# Patient Record
Sex: Male | Born: 1935 | Race: White | Hispanic: No | Marital: Married | Smoking: Never smoker
Health system: Southern US, Community
[De-identification: ages and names within clinical notes are randomized; demographics above are authoritative.]

## PROBLEM LIST (undated history)

## (undated) DIAGNOSIS — K219 Gastro-esophageal reflux disease without esophagitis: Secondary | ICD-10-CM

## (undated) DIAGNOSIS — Z9289 Personal history of other medical treatment: Secondary | ICD-10-CM

## (undated) DIAGNOSIS — I2510 Atherosclerotic heart disease of native coronary artery without angina pectoris: Secondary | ICD-10-CM

## (undated) DIAGNOSIS — I10 Essential (primary) hypertension: Secondary | ICD-10-CM

## (undated) DIAGNOSIS — M109 Gout, unspecified: Secondary | ICD-10-CM

## (undated) DIAGNOSIS — Z951 Presence of aortocoronary bypass graft: Secondary | ICD-10-CM

## (undated) DIAGNOSIS — G4730 Sleep apnea, unspecified: Secondary | ICD-10-CM

## (undated) DIAGNOSIS — C61 Malignant neoplasm of prostate: Secondary | ICD-10-CM

## (undated) DIAGNOSIS — N182 Chronic kidney disease, stage 2 (mild): Secondary | ICD-10-CM

## (undated) DIAGNOSIS — N183 Chronic kidney disease, stage 3 unspecified: Secondary | ICD-10-CM

## (undated) DIAGNOSIS — M4800 Spinal stenosis, site unspecified: Secondary | ICD-10-CM

## (undated) DIAGNOSIS — M1990 Unspecified osteoarthritis, unspecified site: Secondary | ICD-10-CM

## (undated) DIAGNOSIS — I639 Cerebral infarction, unspecified: Secondary | ICD-10-CM

## (undated) HISTORY — DX: Presence of aortocoronary bypass graft: Z95.1

## (undated) HISTORY — DX: Cerebral infarction, unspecified: I63.9

## (undated) HISTORY — DX: Chronic kidney disease, stage 2 (mild): N18.2

## (undated) HISTORY — DX: Personal history of other medical treatment: Z92.89

## (undated) HISTORY — DX: Sleep apnea, unspecified: G47.30

## (undated) HISTORY — DX: Malignant neoplasm of prostate: C61

## (undated) HISTORY — DX: Essential (primary) hypertension: I10

## (undated) HISTORY — PX: CATARACT EXTRACTION W/ INTRAOCULAR LENS IMPLANT: SHX1309

## (undated) HISTORY — DX: Atherosclerotic heart disease of native coronary artery without angina pectoris: I25.10

## (undated) HISTORY — PX: LYMPH NODE DISSECTION: SHX5087

## (undated) HISTORY — PX: RETROPUBIC PROSTATECTOMY: SUR1055

---

## 1998-08-23 ENCOUNTER — Ambulatory Visit (HOSPITAL_COMMUNITY): Admission: RE | Admit: 1998-08-23 | Discharge: 1998-08-23 | Payer: Self-pay | Admitting: Ophthalmology

## 2000-11-03 ENCOUNTER — Encounter: Payer: Self-pay | Admitting: Family Medicine

## 2000-11-03 ENCOUNTER — Ambulatory Visit (HOSPITAL_COMMUNITY): Admission: RE | Admit: 2000-11-03 | Discharge: 2000-11-03 | Payer: Self-pay | Admitting: Family Medicine

## 2001-01-02 ENCOUNTER — Ambulatory Visit (HOSPITAL_COMMUNITY): Admission: RE | Admit: 2001-01-02 | Discharge: 2001-01-02 | Payer: Self-pay | Admitting: Gastroenterology

## 2001-01-02 ENCOUNTER — Encounter (INDEPENDENT_AMBULATORY_CARE_PROVIDER_SITE_OTHER): Payer: Self-pay | Admitting: *Deleted

## 2002-10-01 ENCOUNTER — Encounter: Admission: RE | Admit: 2002-10-01 | Discharge: 2002-10-01 | Payer: Self-pay | Admitting: Urology

## 2002-10-01 ENCOUNTER — Encounter: Payer: Self-pay | Admitting: Urology

## 2002-11-02 ENCOUNTER — Encounter: Payer: Self-pay | Admitting: Urology

## 2002-11-04 ENCOUNTER — Inpatient Hospital Stay (HOSPITAL_COMMUNITY): Admission: RE | Admit: 2002-11-04 | Discharge: 2002-11-08 | Payer: Self-pay | Admitting: Urology

## 2002-11-04 ENCOUNTER — Encounter (INDEPENDENT_AMBULATORY_CARE_PROVIDER_SITE_OTHER): Payer: Self-pay | Admitting: Specialist

## 2005-09-28 ENCOUNTER — Ambulatory Visit (HOSPITAL_COMMUNITY): Admission: RE | Admit: 2005-09-28 | Discharge: 2005-09-28 | Payer: Self-pay | Admitting: Gastroenterology

## 2005-09-28 ENCOUNTER — Encounter (INDEPENDENT_AMBULATORY_CARE_PROVIDER_SITE_OTHER): Payer: Self-pay | Admitting: *Deleted

## 2008-08-04 ENCOUNTER — Ambulatory Visit: Payer: Self-pay | Admitting: Cardiology

## 2009-04-13 ENCOUNTER — Encounter: Payer: Self-pay | Admitting: Cardiology

## 2009-04-13 ENCOUNTER — Ambulatory Visit: Payer: Self-pay

## 2009-04-13 ENCOUNTER — Encounter: Payer: Self-pay | Admitting: Cardiovascular Disease

## 2009-04-13 DIAGNOSIS — C61 Malignant neoplasm of prostate: Secondary | ICD-10-CM | POA: Insufficient documentation

## 2009-04-13 DIAGNOSIS — G4730 Sleep apnea, unspecified: Secondary | ICD-10-CM | POA: Insufficient documentation

## 2009-04-13 DIAGNOSIS — I10 Essential (primary) hypertension: Secondary | ICD-10-CM | POA: Insufficient documentation

## 2009-04-13 DIAGNOSIS — N183 Chronic kidney disease, stage 3 unspecified: Secondary | ICD-10-CM | POA: Insufficient documentation

## 2009-04-13 DIAGNOSIS — D692 Other nonthrombocytopenic purpura: Secondary | ICD-10-CM | POA: Insufficient documentation

## 2009-04-13 DIAGNOSIS — R9431 Abnormal electrocardiogram [ECG] [EKG]: Secondary | ICD-10-CM | POA: Insufficient documentation

## 2009-04-14 ENCOUNTER — Ambulatory Visit: Payer: Self-pay | Admitting: Cardiovascular Disease

## 2009-04-14 DIAGNOSIS — I739 Peripheral vascular disease, unspecified: Secondary | ICD-10-CM | POA: Insufficient documentation

## 2009-04-21 ENCOUNTER — Ambulatory Visit: Payer: Self-pay

## 2009-04-21 ENCOUNTER — Encounter: Payer: Self-pay | Admitting: Cardiovascular Disease

## 2010-04-20 NOTE — Assessment & Plan Note (Signed)
Summary: [facility]   Visit Type:  Follow-up Primary Provider:  Dr. Christell Constant  CC:  Painful Toes.  History of Present Illness: The patient presents for evaluation of bluish discoloration of his toes. This is most prominent of the second and third toes on both feet. He said it started as a painful callus on his right foot. He then noticed some purple spots on his toes that were itching. He does not describe any foot pain. He's not had any claudication. He's felt no palpitations. He's had no fevers or chills. He denies any other cardiovascular symptoms. He's had no problems with peripheral vascular disease or workup of the same in the past. He saw Dr. Christell Constant today. Labs were drawn to include a sedimentation rate, CBC, BMP. I believe a c-ANCA was also drawn. Results are pending.  Current Medications (verified): 1)  Benicar Hct 20-12.5 Mg Tabs (Olmesartan Medoxomil-Hctz) .Marland Kitchen.. 1 By Mouth Daliy  Allergies (verified): No Known Drug Allergies  Past History:  Past Medical History: Reviewed history from 04/13/2009 and no changes required. Current Problems:  HYPERTENSION, UNSPECIFIED (ICD-401.9) ELECTROCARDIOGRAM, ABNORMAL (ICD-794.31) RENAL INSUFFICIENCY (ICD-588.9) SLEEP APNEA (ICD-780.57)- Questionable CARCINOMA, PROSTATE (ICD-185)  Past Surgical History: Reviewed history from 04/13/2009 and no changes required.  Radical retropubic prostatectomy and bilateral pelvic lymph node dissection.  Review of Systems       As stated in the HPI and negative for all other systems.   Vital Signs:  Patient profile:   77 year old male Height:      70 inches Weight:      197 pounds BMI:     28.37 Pulse rate:   73 / minute Resp:     16 per minute BP sitting:   142 / 64  (right arm)  Vitals Entered By: Marrion Coy, CNA (April 13, 2009 4:19 PM)  Physical Exam  General:  Well developed, well nourished, in no acute distress. Head:  normocephalic and atraumatic Eyes:  PERRLA/EOM intact;  conjunctiva and lids normal. Mouth:   Oral mucosa normal. Neck:  Neck supple, no JVD. No masses, thyromegaly or abnormal cervical nodes. Chest Wall:  no deformities or breast masses noted Lungs:  Clear bilaterally to auscultation and percussion. Heart:  Non-displaced PMI, chest non-tender; regular rate and rhythm, S1, S2 soft early peaking systolic murmur heard at the apex, no rubs or gallops. Carotid upstroke normal, no bruit. Normal abdominal aortic size, no bruits. Femorals normal pulses, no bruits. Pedals normal pulses. No edema, no varicosities. Abdomen:  Bowel sounds positive; abdomen soft and non-tender without masses, organomegaly, or hernias noted. No hepatosplenomegaly. Msk:  Back normal, normal gait. Muscle strength and tone normal. Extremities:  no edema, no cyanosis, there are petechial hemorrhages and slight ecchymosis nontender and extremities of his digits particularly second and third toes on the right greater than left foot Psych:  Normal affect.   EKG  Procedure date:  04/13/2009  Findings:      sinus rhythm, rate 73, left axis deviation, no acute ST-T wave changes.  Impression & Recommendations:  Problem # 1:  VASCULAR PURPURA (ICD-287.2) I am worried that there could be some embolic or vasculitic process going on although the patient is not systemically ill. Labs have been drawn and we will follow these. I have spoken with Dr. Excell Seltzer one of our peripheral vascular disease physicians. He kindly agrees to see the patient tomorrow to further evaluate his feet. Until then I have suggested no further testing or therapy.

## 2010-04-20 NOTE — Assessment & Plan Note (Signed)
Summary: per DR [facility]ON   Visit Type:  new pt  Primary Provider:  Dr. Christell Constant  CC:  pt states his toes are a little blue..says his chest discomfort was doing better..denies any edema or sob.  History of Present Illness: This is a 77 year-old gentleman presenting for initial evaluation of purplish discoloration of his toes. The pt was seen by Dr Antoine Poche yesterday and we discussed his case.  Approximately 2 weeks ago he developed blue/purple discoloration on an area of callus on the left heel and also on the toes. He describes some associated pain involving his toes. He denies any past history of this. States that the rash and pain are both resolving at the present time. He denies fever, chills, chest or abdominal pain, nausea, vomiting, weight loss, or other systemic symptoms. He has had no recent medical tests or vascular procedures. He has no leg pain with ambulation. No history of stroke or TIA.  Current Medications (verified): 1)  Benicar Hct 20-12.5 Mg Tabs (Olmesartan Medoxomil-Hctz) .Marland Kitchen.. 1 By Mouth Daliy  Allergies (verified): No Known Drug Allergies  Past History:  Past medical, surgical, family and social histories (including risk factors) reviewed, and no changes noted (except as noted below).  Past Medical History: Reviewed history from 04/13/2009 and no changes required. Current Problems:  HYPERTENSION, UNSPECIFIED (ICD-401.9) ELECTROCARDIOGRAM, ABNORMAL (ICD-794.31) RENAL INSUFFICIENCY (ICD-588.9) SLEEP APNEA (ICD-780.57)- Questionable CARCINOMA, PROSTATE (ICD-185)  Past Surgical History: Reviewed history from 04/13/2009 and no changes required.  Radical retropubic prostatectomy and bilateral pelvic lymph node dissection.  Family History: Reviewed history from 04/13/2009 and no changes required.  Noncontributory for early coronary artery disease.  His   father he says died of a cerebral aneurysm.      Social History: Reviewed history from 04/13/2009 and no  changes required.  The patient is married.  He has 3 children.  He is   retired.  He has never smoked cigarettes.  He does not drink alcohol.      Review of Systems       Negative except as per HPI   Vital Signs:  Patient profile:   77 year old male Height:      70 inches Weight:      195 pounds BMI:     28.08 Pulse rate:   68 / minute Pulse rhythm:   regular BP sitting:   158 / 62  (left arm) BP standing:   152 / 60  (left arm) Cuff size:   large  Vitals Entered By: Danielle Rankin, CMA (April 14, 2009 2:29 PM)  Physical Exam  General:  Pt is alert and oriented, very pleasant elderly man in no acute distress. HEENT: normal Neck: normal carotid upstrokes without bruits, JVP normal Lungs: CTA CV: RRR without murmur or gallop Abd: soft, NT, positive BS, no bruit, no organomegaly Ext: no clubbing, cyanosis, or edema. femoral pulses 2+ without bruit, DP and PT pulses 2+=. 2nd and 3rd toes on both feet have purpuric lesions on the distal aspect of the toes, mild tenderness to touch.    Impression & Recommendations:  Problem # 1:  VASCULAR PURPURA (ICD-287.2) The etiology of the patient's rash is uncertain, but I suspect this is secondary to cholesterol crystal embolism (small vessel involvement). Since the patient has not had recent instrumentation, warfarin use, or other obvious etiology, he likely has aortic plaque as an etiology. I have recommended a urinalysis and urine eosinophil count to eval for renal involvement, and arterial studies with an  abdominal aortic ultrasound to rule out aneurysm and ABI's with toe-brachial indices. Fortunately, it doesn't appear the patient has significant systemic involvement. If no vascular lesion is found, the treatment will consist of ASA, statin, and general vascular risk reduction measures.  Orders: T-Urinalysis ([id number]) T- * Misc. Laboratory test [id number]) Abdominal Aorta Duplex (Abd Aorta Duplex)  Other Orders: Arterial Duplex  Lower Extremity (Arterial Duplex Low)  Patient Instructions: 1)  Your physician recommends that you schedule a follow-up appointment in: 1 month 2)  Your physician recommends that you return for lab work [id number] Urinalysis, Urine Eosinophil. 3)  Your physician has requested that you have an abdominal aorta duplex. During this test, an ultrasound is used to evaluate the aorta. Allow 30 minutes for this exam. Do not eat after midnight the day before and avoid carbonated beverages. There are no restrictions or special instructions. 4)  Your physician has requested that you have a lower or upper extremity arterial duplex.  This test is an ultrasound of the arteries in the legs or arms.  It looks at arterial blood flow in the legs and arms.  Allow one hour for Lower and Upper Arterial scans. There are no restrictions or special instructions.LE [place] with ABI'S and TBI'S

## 2010-05-29 ENCOUNTER — Other Ambulatory Visit: Payer: Self-pay | Admitting: Family Medicine

## 2010-05-29 DIAGNOSIS — T148XXA Other injury of unspecified body region, initial encounter: Secondary | ICD-10-CM

## 2010-05-29 DIAGNOSIS — M479 Spondylosis, unspecified: Secondary | ICD-10-CM

## 2010-05-30 ENCOUNTER — Ambulatory Visit
Admission: RE | Admit: 2010-05-30 | Discharge: 2010-05-30 | Disposition: A | Discharge status: Home / Self Care | Payer: MEDICARE | Source: Ambulatory Visit | Attending: Family Medicine | Admitting: Family Medicine

## 2010-05-30 DIAGNOSIS — M479 Spondylosis, unspecified: Secondary | ICD-10-CM

## 2010-05-30 DIAGNOSIS — T148XXA Other injury of unspecified body region, initial encounter: Secondary | ICD-10-CM

## 2010-08-01 NOTE — Assessment & Plan Note (Signed)
[facility]                            CARDIOLOGY OFFICE NOTE   Jon, Reid                       MRN:          [id number]  DATE:08/04/2008                            DOB:          03-16-1929    REFERRING PHYSICIAN:  Ernestina Penna, M.D.   REASON FOR PRESENTATION:  Evaluate the patient with an abnormal EKG and  hypertension.   HISTORY OF PRESENT ILLNESS:  The patient presents for evaluation of the  above.  He has no prior cardiac history.  However, he does have  hypertension.  He has an abnormal EKG with poor anterior R-wave  progression.  He says he has never had any cardiovascular workup.  He  has never had any cardiovascular diagnosis.  He reports he is quite  active.  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  He walks from his house into town in [place], which is about a  mile nightly.  He says with this level of activity, he denies any chest  discomfort, neck, or arm discomfort.  He says he has no palpitations,  presyncope, or syncope.  He does not report any PND or orthopnea.  He  has had some high blood pressure.  He has been on Benicar HCT.  His  blood pressure is elevated today, but he does have a blood pressure cuff  and a diary that he keeps.  This is in Dr. Kathi Der chart and I have  reviewed this.  His home blood pressures are below 140/90 fairly  consistently.  I did review his labs.  He has a mild dyslipidemia with  an LDL of 114.  However, his HDL is 48.  He thus have some mild renal  insufficiency probably related to his hypertension.  There is also  question of sleep apnea.  His wife brings this up because he snores.  However, he has not had a sleep workup.  He is very resistant to  medications and being diagnosed with any problems.  He reports that he  thinks he does very well given his age.   PAST MEDICAL HISTORY:  1. Hypertension x1 year.  2. Questionable sleep apnea.  3. Renal  insufficiency.   PAST SURGICAL HISTORY:  Prostatectomy.   ALLERGIES:  None.   MEDICATIONS:  Benicar HCT 40/12.5 daily.   SOCIAL HISTORY:  The patient is married.  He has 3 children.  He is  retired.  He has never smoked cigarettes.  He does not drink alcohol.   FAMILY HISTORY:  Noncontributory for early coronary artery disease.  His  father he says died of a cerebral aneurysm.   REVIEW OF SYSTEMS:  As stated in the HPI and negative for all other  systems.   PHYSICAL EXAMINATION:  GENERAL:  The patient is very pleasant and in no  distress.  VITAL SIGNS:  Blood pressure 152/80, heart rate 64 and regular, body  mass index 27, weight 202 pounds.  HEENT:  Eyelids unremarkable.  Pupils equal, round, and reactive to  light.  Fundi not visualized.  Oral mucosa unremarkable.  NECK:  No jugular venous distension at 45 degrees.  Carotid upstroke  brisk and symmetric.  No bruits.  No thyromegaly.  LYMPHATICS:  No cervical, axillary, or inguinal adenopathy.  LUNGS:  Clear to auscultation bilaterally.  BACK:  No costovertebral angle tenderness.  CHEST:  Unremarkable.  HEART:  PMI not displaced or sustained.  S1 and S2 within normal limits.  No S3, no S4.  No clicks, no rubs, no murmurs.  ABDOMEN:  Obese, positive bowel sounds, normal in frequency and pitch.  No bruits, no rebound, no guarding.  No midline pulsatile mass.  No  hepatomegaly, no splenomegaly.  SKIN:  No rashes, no nodules.  EXTREMITIES:  Pulses 2+ throughout.  No edema, no cyanosis, no clubbing.  NEUROLOGIC:  Oriented to person, place, and time.  Cranial nerves II  through XII grossly intact.  Motor grossly intact.   EKG, sinus rhythm, rate 64, left axis deviation, no acute ST-T wave  changes.   ASSESSMENT AND PLAN:  1. He had normal EKG.  The patient has some left axis deviation.      There is questionable poor anterior R-wave progression.  However, I      do not think, there is any evidence of old anteroseptal  infarct.      Previous EKG of Dr. Kathi Der office demonstrated an incomplete right      bundle-branch block.  These are all nonspecific findings.  At this      point, I think, the pretest probability of obstructive coronary      artery disease is very low and would not suggest that stress      testing would be indicated giving his very high level of activity      and his absence of symptoms.  Rather, he should continue with      primary risk reduction.  2. Hypertension.  His blood pressure is controlled.  He says it is a      good blood pressure cuff and he knows it to be accurate.  I did      point out that he probably has some strain on his heart with some      minimal LVH.  I also pointed out that he has some renal      insufficiency, which is likely related to some high blood pressure.      Therefore, he understands the need for good control of this.  He      will continue under the watchful eye of Dr. Christell Constant.  3. Dyslipidemia.  He has a reasonable ratio in the absence of known      coronary artery disease.  He will continue with diet and exercise.  4. Followup.  I would be happy to see him back at any point if there      are any further questions.     Rollene Rotunda, MD, [facility]  Electronically Signed   JH/MedQ  DD: 08/04/2008  DT: 08/05/2008  Job #: [id number]   cc:   Ernestina Penna, M.D.

## 2010-08-04 NOTE — Procedures (Signed)
[place]  Patient:    Jon Reid, Jon Reid Visit Number: [id number] MRN: [id number]          Service Type: END Location: ENDO Attending Physician:  Orland Mustard Dictated by:   Llana Aliment. Randa Evens, M.D. Proc. Date: 01/02/01 Admit Date:  01/02/2001   CC:         Antony Madura, M.D.   Procedure Report  PROCEDURE PERFORMED:  Colonoscopy and polypectomy.  ENDOSCOPIST:  Llana Aliment. Randa Evens, M.D.  MEDICATIONS USED:  Fentanyl 90 mcg, Versed 9 mg IV.  INSTRUMENT:  Adult Olympus video colonoscope.  INDICATIONS:  Strong family history of colon cancer.  DESCRIPTION OF PROCEDURE:  The procedure had been explained to the patient and consent obtained.  With the patient in the left lateral decubitus position, the Olympus adult video colonoscope was inserted and advanced under direct visualization.  The prep was excellent.  The patient had moderate diverticular disease.  It took some time to pass the sigmoid colon and we were able to advance rapidly to the cecum.  The ileocecal valve and appendiceal orifice were identified.  In the cecum, a 0.5 cm sessile polyp was encountered and removed through the snare and sucked through the scope.  No other polyps were seen.  The ascending colon, hepatic flexure, transverse colon, splenic flexure, descending and sigmoid colon were seen well upon withdrawal.  No polyps or other lesions were seen.  Moderate diverticular disease in the sigmoid colon.  The patient tolerated the procedure well.  Maintained on low flow oxygen and pulse oximeter throughout the procedure with no obvious problem.  ASSESSMENT: 1. Cecal polyp removed. 2. Moderate diverticulosis.  PLAN:  Routine postpolypectomy instructions and recommend repeating in three years. Dictated by:   Llana Aliment. Randa Evens, M.D. Attending Physician:  Orland Mustard DD:  01/02/01 TD:  01/02/01 Job: [id number]

## 2010-08-04 NOTE — Discharge Summary (Signed)
   NAME:  Jon Reid, Jon Reid                          ACCOUNT NO.:  [id number]   MEDICAL RECORD NO.:  [id number]                   PATIENT TYPE:  INP   LOCATION:  0372                                 FACILITY:  [facility]   PHYSICIAN:  Maretta Bees. Vonita Moss, M.D.             DATE OF BIRTH:  1928/05/15   DATE OF ADMISSION:  11/04/2002  DATE OF DISCHARGE:  11/08/2002                                 DISCHARGE SUMMARY   FINAL DIAGNOSES:  Prostatic carcinoma.   PROCEDURE:  Radical retropubic prostatectomy and bilateral pelvic lymph node  dissection on November 04, 2002.   HISTORY:  This 77 year old male was found to have a PSA of 4.83 and biopsy  showed Gleason grade 7 carcinoma in both lobes of the prostate.  He was  counseled about therapies and he opted for radical prostatectomy.  He is in  general good health.   PHYSICAL EXAMINATION:  Noncontributory.   HOSPITAL COURSE:  After admission he underwent a radical retropubic  prostatectomy and bilateral pelvic lymph node dissection.  Final pathology  showed negative lymph nodes and clear margins, but some focal extracapsular  extension of Gleason grade 7 carcinoma.  He had some pulmonary fever  postoperatively, but otherwise did well and was tolerating a diet and  ambulating well and ready for discharge on November 08, 2002.  His drain was  removed prior to discharge.  He had some mild penal/scrotal edema.  At the  time of discharge he was to go home on light activity and diet as tolerated.  He will return to the office in one week for skin staple removal.  He was  sent home on Vicodin for pain and laxatives as needed.  Sent home in good  condition.                                               Maretta Bees. Vonita Moss, M.D.    LJP/MEDQ  D:  11/24/2002  T:  11/24/2002  Job:  [id number]

## 2010-08-04 NOTE — H&P (Signed)
   NAME:  Jon Reid, Jon Reid                          ACCOUNT NO.:  [id number]   MEDICAL RECORD NO.:  [id number]                   PATIENT TYPE:  INP   LOCATION:  X003                                 FACILITY:  [facility]   PHYSICIAN:  Maretta Bees. Vonita Moss, M.D.             DATE OF BIRTH:  1929-01-24   DATE OF ADMISSION:  11/04/2002  DATE OF DISCHARGE:                                HISTORY & PHYSICAL   HISTORY:  This 77 year old gentleman was seen by me on June 23 for a PSA of  4.83.  PSA a year ago was 3.59.  He underwent prostate ultrasound and biopsy  on September 24, 2002.  His prostate was 31.5 mL and he had Gleason grade 7 (3+4)  in both lobes of the prostate.  He was counseled about therapy including  radiation therapy, observation, and surgery and he opted for surgery having  been counseled about risks of impotence, incontinence, rectal injury, or  hemorrhage.  CT scan showed no evidence of metastatic disease but he did  have sigmoid diverticula and a hiatal hernia.  Bone scan showed no evidence  of metastatic disease and therefore he was brought to the hospital today for  his radical prostatectomy.   PAST MEDICAL HISTORY:  He is in general good health.  He has no serious  medical illnesses.   PAST SURGICAL HISTORY:  He has had no previous surgery.   MEDICATIONS:  None.   ALLERGIES:  Denies any allergies to drugs.   SOCIAL HISTORY:  He does not smoke.  He drinks beer on occasion.   REVIEW OF SYSTEMS:  Noted in health history form.   FAMILY HISTORY:  Noncontributory.   PHYSICAL EXAMINATION:  VITAL SIGNS:  Blood pressure 184/89, pulse 80,  temperature 98.6.  GENERAL:  Alert and oriented.  Stated age.  SKIN:  Warm and dry.  NECK:  Supple.  CHEST:  Clear.  HEART:  Tones regular.  ABDOMEN:  Soft, nontender.  GENITOURINARY:  External genitalia normal.  RECTAL:  Prostate felt, benign.  EXTREMITIES:  No edema.   IMPRESSION:  1. Prostatic carcinoma.  2.     Hiatal hernia.  3.  Sigmoid diverticula.   PLAN:  Radical retropubic prostatectomy and bilateral pelvic lymph node  dissection.                                               Maretta Bees. Vonita Moss, M.D.    LJP/MEDQ  D:  11/04/2002  T:  11/04/2002  Job:  [id number]   cc:   Antony Madura, M.D.  [address], [address]  [place]  [place] [address]  Fax: [phone]

## 2010-08-04 NOTE — Procedures (Signed)
[place]  Patient:    Jon Reid, Jon Reid Visit Number: [id number] MRN: [id number]          Service Type: END Location: ENDO Attending Physician:  Orland Mustard Dictated by:   Llana Aliment. Randa Evens, M.D. Admit Date:  01/02/2001                             Procedure Report  NO DICTATION. Dictated by:   Llana Aliment. Randa Evens, M.D. Attending Physician:  Orland Mustard DD:  01/02/01 TD:  01/02/01 Job: [id number]

## 2010-08-04 NOTE — Op Note (Signed)
NAME:  Jon Reid, Jon Reid                          ACCOUNT NO.:  [id number]   MEDICAL RECORD NO.:  [id number]                   PATIENT TYPE:  INP   LOCATION:  X003                                 FACILITY:  [facility]   PHYSICIAN:  Maretta Bees. Vonita Moss, M.D.             DATE OF BIRTH:  1928/08/04   DATE OF PROCEDURE:  11/04/2002  DATE OF DISCHARGE:                                 OPERATIVE REPORT   PREOPERATIVE DIAGNOSIS:  Prostatic carcinoma.   POSTOPERATIVE DIAGNOSIS:  Prostatic carcinoma.   PROCEDURE:  Radical retropubic prostatectomy and bilateral pelvic lymph node  dissection.   SURGEON:  Maretta Bees. Vonita Moss, M.D.   ASSISTANTS:  1. Veverly Fells. Vernie Ammons, M.D.  2. Susanne Borders, M.D.   ANESTHESIA:  General.   INDICATIONS:  This 77 year old gentleman was found to have prostatic  carcinoma as noted in his H&P with a PSA of 4.03 and Gleason grade 7  carcinoma in the both lobes of the prostate.  He was counseled about  therapy, including surgery, radiation therapy, and observation, and he opted  for radical retropubic prostatectomy and was cleared for surgery by Antony Madura, M.D.   DESCRIPTION OF PROCEDURE:  The patient was brought to operating room and  placed in supine position.  The lower abdomen and external genitalia were  prepped and draped in the usual fashion.  A #24 French 30 mL Foley was  placed in the bladder.  A suprapubic vertical incision was made from the  symphysis pubis to below the umbilicus.  The rectus fascia and muscle were  divided in the midline.  The retroperitoneal pelvic spaces were dissected  out bilaterally with identification of the major vessels and the obturator  nerve.  The right obturator lymph node packet was dissected out, and Hem-o-  lok clips were used for hemostasis and lymphostasis, and the specimen was  sent for permanent section.  And in like fashion, the left obturator lymph  node packet was dissected out without problems.  The endopelvic  fascia was  then taken down bilaterally and the prostate mobilized.  There was no  significant puboprostatic ligaments.  There was a venous bleeder to the left  of the endopelvic fascia that was suture ligated.  The dorsal vein complexes  were pushed down and away from the periprostatic tissues.  A McDougal clamp  was placed around the dorsal vein complex, and a 2-0 Vicryl ligature was  placed on this, and later a 2-0 Vicryl suture ligature was also placed for  further hemostasis.  The Stamey retractor was used to bunch up the  preprostatic vessels, and a suture of 2-0 chromic catgut was placed to  prevent back-bleeding.  With the Stamey catheter in place and the McDougal  catheter behind the dorsal vein complex, the dorsal vein complex was incised  down to the urethra just beyond the apex of the prostate.  An umbilical tape  was placed around the back of the urethra which was then divided with a nice  apical margin and a god urethra.  The prostate was then dissected up off the  rectum without difficulty, and the prostatic pedicles taken down and divided  and ligated with hemoclips.  The seminal vesicals came up nicely off the  posterior attachments.  Unfortunately, there was some bleeding from the  vessels in the right neurovascular bundle that required some suture ligation  and small hemoclips to control.  The anterior bladder neck was opened up,  and the ureteral orifices were noted to excrete indigo blue stained urine.  The posterior prostate was dissected out after dividing posterior bladder  neck and electrocautery and hemoclips used for hemostasis.  The tips of the  seminal vesicals were dissected out, and the vas deferens and seminal  vesicals were removed after using hemoclips for hemostasis.  Specimen was  sent to pathology.  The mucosa of the bladder neck was approximated to the  surrounding musculature with interrupted 4-0 chromic catgut.  The posterior  bladder neck was closed  with running 2-0 chromic catgut.  The 22 French 5 mL  Foley was placed per urethra and brought into the bladder after using #1  Prolene that was tied through the tip of the catheter and brought out later  through the anterior bladder wall and later through the abdominal wall and  sewn to skin level with a button to secure it.  Then 2-0 Vicryl was placed  at 2, 5, 7, 12 o'clock through the bladder neck and corresponding aspect of  the membranous urethra.  The bladder neck anastomoses were then tied down  and secured in good position with 15 mL of water in the Foley catheter  balloon.  Bladder was then irrigated with antibiotic solution.  There was a  watertight anastomosis.  Blake drain was placed through a stab wound to the  left of our incision and placed prevesically and connected to bulb suction  and sutured in place with a black silk.  The wound was irrigated with triple  antibiotic solution.  The rectus fascia closed with running #1 PDS suture.  The skin was closed with interrupted skin staples.  Foley catheter was  connected to closed drainage after placing it on traction.  Sponge, needle,  and instrument count was correct.  Estimated blood loss was 800 mL, and he  tolerated the procedure well and was taken to the recovery room in good  condition.                                               Maretta Bees. Vonita Moss, M.D.    LJP/MEDQ  D:  11/04/2002  T:  11/04/2002  Job:  [id number]   cc:   Antony Madura, M.D.  [address], [address]  [place]  [place] [address]  Fax: [phone]

## 2010-08-04 NOTE — Op Note (Signed)
NAMEQUINTELL, Jon Reid                ACCOUNT NO.:  [id number]   MEDICAL RECORD NO.:  [id number]          PATIENT TYPE:  AMB   LOCATION:  ENDO                         FACILITY:  [facility]   PHYSICIAN:  Anselmo Rod, M.D.  DATE OF BIRTH:  Dec 07, 1928   DATE OF PROCEDURE:  09/28/2005  DATE OF DISCHARGE:                                 OPERATIVE REPORT   PROCEDURE. PERFORMED:  Colonoscopy with stent, polypectomy X 1 and cold  biopsies X4.   INDICATIONS FOR PROCEDURE:  This is a 77 year old white male with a personal  history of prostate cancer undergoing screening colonoscopy.  The patient  has a family history of colon cancer in his mother, rule out colonic polyps,  masses, etcetera.   PRE PROCEDURE PREPARATION:  Informed consent was procured from the patient.  The patient fasted for four hours prior to the procedure and prepped with  Osmo prep (32 pills) the night prior to and the morning of the procedure.  Risks and benefits of the procedure including a 10% risk for perforation  were discussed with the patient as well.   PRE PROCEDURE PHYSICAL:  The patient had stable vital signs.  NECK:  Supple.  CHEST:  Clear to auscultation with S1 S2.  ABDOMEN:  Soft with normal bowel sounds.   DESCRIPTION OF PROCEDURE:  The patient was placed in the left lateral  decubitus position and sedated with 70 mcg of fentanyl and 6 mg of Versed in  incremental doses.  Once the patient was adequately sedated and maintained  normal flow oxygen and continuous cardiac monitoring, the colonoscope was  advanced from the rectum to the cecum with difficulty.  There was  significant amount of residual in the colon.  Multiple washes were done.  A  small sessile polyp was removed by hard snare from the distal right colon.  Two small sessile polyps were biopsied from the cecal base.  Another two  small sessile polyps were biopsied from the mid right colon.  The terminal  ileum appeared healthy without lesions.   There was evidence of extensive  left-sided diverticulosis.  Prominent internal hemorrhoids were seen in  retroflexion to the rectum.  The patient tolerated the procedure well  without immediate complications.   IMPRESSION:  1.Prominent internal hemorrhoids seen in retroflexion.  2.Extensive sigmoid diverticulosis.  3.Multiple polyps removed by cold and one by a hard snare from the right  colon and cecum.  4.Normal terminal ileum.   RECOMMENDATIONS:  1.Await pathology results.  2.Avoid nonsteroidals and aspirin for the next two weeks.  3.Repeat colonoscopy depending on pathology results.  4.Brochures on diverticulosis and a high fiber diet have been given to the  patient for his education.  5.Outpatient follow up as need arises in the future.      Anselmo Rod, M.D.  Electronically Signed     JNM/MEDQ  D:  09/28/2005  T:  09/29/2005  Job:  [id number]   cc:   Maretta Bees. Vonita Moss, M.D.  Fax: [phone]   Antony Madura, M.D.  Fax: [phone]

## 2010-10-24 ENCOUNTER — Encounter: Payer: Self-pay | Admitting: Cardiology

## 2011-04-09 DIAGNOSIS — R269 Unspecified abnormalities of gait and mobility: Secondary | ICD-10-CM | POA: Diagnosis not present

## 2011-04-09 DIAGNOSIS — M47812 Spondylosis without myelopathy or radiculopathy, cervical region: Secondary | ICD-10-CM | POA: Diagnosis not present

## 2011-04-20 DIAGNOSIS — N529 Male erectile dysfunction, unspecified: Secondary | ICD-10-CM | POA: Diagnosis not present

## 2011-04-20 DIAGNOSIS — C61 Malignant neoplasm of prostate: Secondary | ICD-10-CM | POA: Diagnosis not present

## 2011-04-20 DIAGNOSIS — Z8546 Personal history of malignant neoplasm of prostate: Secondary | ICD-10-CM | POA: Diagnosis not present

## 2011-04-25 DIAGNOSIS — E538 Deficiency of other specified B group vitamins: Secondary | ICD-10-CM | POA: Diagnosis not present

## 2011-05-10 DIAGNOSIS — I10 Essential (primary) hypertension: Secondary | ICD-10-CM | POA: Diagnosis not present

## 2011-05-10 DIAGNOSIS — E785 Hyperlipidemia, unspecified: Secondary | ICD-10-CM | POA: Diagnosis not present

## 2011-05-10 DIAGNOSIS — E559 Vitamin D deficiency, unspecified: Secondary | ICD-10-CM | POA: Diagnosis not present

## 2011-06-13 DIAGNOSIS — E538 Deficiency of other specified B group vitamins: Secondary | ICD-10-CM | POA: Diagnosis not present

## 2011-06-26 DIAGNOSIS — I10 Essential (primary) hypertension: Secondary | ICD-10-CM | POA: Diagnosis not present

## 2011-06-26 DIAGNOSIS — Z79899 Other long term (current) drug therapy: Secondary | ICD-10-CM | POA: Diagnosis not present

## 2011-06-26 DIAGNOSIS — Z800 Family history of malignant neoplasm of digestive organs: Secondary | ICD-10-CM | POA: Diagnosis not present

## 2011-06-26 DIAGNOSIS — D469 Myelodysplastic syndrome, unspecified: Secondary | ICD-10-CM | POA: Diagnosis not present

## 2011-06-26 DIAGNOSIS — D649 Anemia, unspecified: Secondary | ICD-10-CM | POA: Diagnosis not present

## 2011-06-26 DIAGNOSIS — Z8546 Personal history of malignant neoplasm of prostate: Secondary | ICD-10-CM | POA: Diagnosis not present

## 2011-06-26 DIAGNOSIS — M47812 Spondylosis without myelopathy or radiculopathy, cervical region: Secondary | ICD-10-CM | POA: Diagnosis not present

## 2011-06-26 DIAGNOSIS — R269 Unspecified abnormalities of gait and mobility: Secondary | ICD-10-CM | POA: Diagnosis not present

## 2011-07-03 DIAGNOSIS — Z800 Family history of malignant neoplasm of digestive organs: Secondary | ICD-10-CM | POA: Diagnosis not present

## 2011-07-03 DIAGNOSIS — Z79899 Other long term (current) drug therapy: Secondary | ICD-10-CM | POA: Diagnosis not present

## 2011-07-03 DIAGNOSIS — I10 Essential (primary) hypertension: Secondary | ICD-10-CM | POA: Diagnosis not present

## 2011-07-03 DIAGNOSIS — Z8546 Personal history of malignant neoplasm of prostate: Secondary | ICD-10-CM | POA: Diagnosis not present

## 2011-07-03 DIAGNOSIS — D649 Anemia, unspecified: Secondary | ICD-10-CM | POA: Diagnosis not present

## 2011-07-03 DIAGNOSIS — R269 Unspecified abnormalities of gait and mobility: Secondary | ICD-10-CM | POA: Diagnosis not present

## 2011-07-03 DIAGNOSIS — D469 Myelodysplastic syndrome, unspecified: Secondary | ICD-10-CM | POA: Diagnosis not present

## 2011-07-03 DIAGNOSIS — M47812 Spondylosis without myelopathy or radiculopathy, cervical region: Secondary | ICD-10-CM | POA: Diagnosis not present

## 2011-07-16 DIAGNOSIS — E538 Deficiency of other specified B group vitamins: Secondary | ICD-10-CM | POA: Diagnosis not present

## 2011-08-15 DIAGNOSIS — E538 Deficiency of other specified B group vitamins: Secondary | ICD-10-CM | POA: Diagnosis not present

## 2011-08-27 DIAGNOSIS — E559 Vitamin D deficiency, unspecified: Secondary | ICD-10-CM | POA: Diagnosis not present

## 2011-08-27 DIAGNOSIS — I10 Essential (primary) hypertension: Secondary | ICD-10-CM | POA: Diagnosis not present

## 2011-08-27 DIAGNOSIS — R269 Unspecified abnormalities of gait and mobility: Secondary | ICD-10-CM | POA: Diagnosis not present

## 2011-08-27 DIAGNOSIS — E039 Hypothyroidism, unspecified: Secondary | ICD-10-CM | POA: Diagnosis not present

## 2011-08-27 DIAGNOSIS — E785 Hyperlipidemia, unspecified: Secondary | ICD-10-CM | POA: Diagnosis not present

## 2011-08-27 DIAGNOSIS — R7989 Other specified abnormal findings of blood chemistry: Secondary | ICD-10-CM | POA: Diagnosis not present

## 2011-09-10 ENCOUNTER — Telehealth: Payer: Self-pay | Admitting: Internal Medicine

## 2011-09-10 NOTE — Telephone Encounter (Signed)
called back and made new pt appt for 7/9   aom

## 2011-09-10 NOTE — Telephone Encounter (Signed)
l/m re:new pt appt  aom

## 2011-09-13 ENCOUNTER — Telehealth: Payer: Self-pay | Admitting: Internal Medicine

## 2011-09-13 NOTE — Telephone Encounter (Signed)
Referred by Dr. Rudi Heap Dx-Myelodysplasia

## 2011-09-15 DIAGNOSIS — Z961 Presence of intraocular lens: Secondary | ICD-10-CM | POA: Diagnosis not present

## 2011-09-19 DIAGNOSIS — E538 Deficiency of other specified B group vitamins: Secondary | ICD-10-CM | POA: Diagnosis not present

## 2011-09-25 ENCOUNTER — Telehealth: Payer: Self-pay | Admitting: Internal Medicine

## 2011-09-25 ENCOUNTER — Other Ambulatory Visit (HOSPITAL_BASED_OUTPATIENT_CLINIC_OR_DEPARTMENT_OTHER): Payer: MEDICARE | Admitting: Lab

## 2011-09-25 ENCOUNTER — Ambulatory Visit (HOSPITAL_BASED_OUTPATIENT_CLINIC_OR_DEPARTMENT_OTHER): Payer: MEDICARE | Admitting: Internal Medicine

## 2011-09-25 ENCOUNTER — Ambulatory Visit: Payer: MEDICARE

## 2011-09-25 VITALS — BP 139/67 | HR 64 | Temp 97.2°F | Ht 69.5 in | Wt 178.9 lb

## 2011-09-25 DIAGNOSIS — E538 Deficiency of other specified B group vitamins: Secondary | ICD-10-CM | POA: Diagnosis not present

## 2011-09-25 DIAGNOSIS — I10 Essential (primary) hypertension: Secondary | ICD-10-CM

## 2011-09-25 DIAGNOSIS — D469 Myelodysplastic syndrome, unspecified: Secondary | ICD-10-CM | POA: Diagnosis not present

## 2011-09-25 DIAGNOSIS — Z8546 Personal history of malignant neoplasm of prostate: Secondary | ICD-10-CM | POA: Diagnosis not present

## 2011-09-25 DIAGNOSIS — D7589 Other specified diseases of blood and blood-forming organs: Secondary | ICD-10-CM

## 2011-09-25 LAB — COMPREHENSIVE METABOLIC PANEL
Albumin: 4.5 g/dL (ref 3.5–5.2)
Alkaline Phosphatase: 64 U/L (ref 39–117)
BUN: 27 mg/dL — ABNORMAL HIGH (ref 6–23)
CO2: 24 mEq/L (ref 19–32)
Calcium: 9.2 mg/dL (ref 8.4–10.5)
Chloride: 104 mEq/L (ref 96–112)
Glucose, Bld: 96 mg/dL (ref 70–99)
Potassium: 4.4 mEq/L (ref 3.5–5.3)

## 2011-09-25 LAB — CBC WITH DIFFERENTIAL/PLATELET
Basophils Absolute: 0 10*3/uL (ref 0.0–0.1)
Eosinophils Absolute: 0.1 10*3/uL (ref 0.0–0.5)
HGB: 13.3 g/dL (ref 13.0–17.1)
MCV: 104.4 fL — ABNORMAL HIGH (ref 79.3–98.0)
MONO#: 0.6 10*3/uL (ref 0.1–0.9)
MONO%: 6.2 % (ref 0.0–14.0)
NEUT#: 6.7 10*3/uL — ABNORMAL HIGH (ref 1.5–6.5)
RDW: 12.4 % (ref 11.0–14.6)

## 2011-09-25 LAB — LACTATE DEHYDROGENASE: LDH: 166 U/L (ref 94–250)

## 2011-09-25 NOTE — Progress Notes (Signed)
[name] [phone]   [phone]  CONSULT NOTE  REASON FOR CONSULTATION:  78 years old white male with macrocytosis.  HPI [name] is a 78 y.o. male was past medical history significant for hypertension, prostate cancer status post prostatectomy 12 years ago, cervical spondylosis, and history of vitamin B12 deficiency. The patient was found by his primary care physician Dr. Christell Constant to have macrocytosis. He was seen by Dr. Dara Lords on 06/26/2011 for evaluation of this problem. He has several studies performed at that time including thyroid function, ESR, LDH, reticulocyte, quantitative immunoglobulin and antiglobulin test as well as SPEP and peripheral blood smear. They were all unremarkable except for macrocytosis on the peripheral blood smear. Dr. Dara Lords recommended for the patient continuous observation especially he was asymptomatic. The patient was seen recently by Dr. Christell Constant and he requested a second opinion. He was referred to me today for further evaluation and recommendation regarding his condition. He lost around 20 pounds over the last year. He has no night sweats. He denied having any bleeding or bruises. He has intermittent weakness and fatigue. He has no significant complaints today except for occasional neck pain secondary to cervical stenosis. The patient mentions that he is interested in moving his care to [place]. He is married and has 3 children. He works in the building of antique cars. He has no history of smoking, alcohol or drug abuse.  @SFHPI @  Past Medical History  Diagnosis Date  . Unspecified essential hypertension   . Electrocardiogram abnormal   . Renal insufficiency   . Sleep apnea     Questionable  . Carcinoma of prostate     Past Surgical History  Procedure Date  . Retropubic prostatectomy     Radical  . Lymph node dissection     Bilateral pelvic    Family History  Problem Relation Age of Onset  .  Aneurysm Father     Cerebral  . Coronary artery disease Neg Hx     Social History History  Substance Use Topics  . Smoking status: Never Smoker   . Smokeless tobacco: Not on file  . Alcohol Use: No    Not on File  Current Outpatient Prescriptions  Medication Sig Dispense Refill  . cholecalciferol (VITAMIN D) 1000 UNITS tablet Take 2,000 Units by mouth daily.      . cyanocobalamin 1000 MCG tablet Inject 100 mcg into the muscle every 30 (thirty) days.      . DULoxetine (CYMBALTA) 30 MG capsule Take 30 mg by mouth 3 (three) times a week.      . olmesartan-hydrochlorothiazide (BENICAR HCT) 20-12.5 MG per tablet Take 1 tablet by mouth daily.          Review of Systems  A comprehensive review of systems was negative except for: Constitutional: positive for fatigue and weight loss  Physical Exam  AVW:UJWJX, healthy, no distress, well nourished and well developed SKIN: skin color, texture, turgor are normal HEAD: Normocephalic, No masses, lesions, tenderness or abnormalities EYES: normal EARS: External ears normal OROPHARYNX:no exudate and no erythema  NECK: supple, no adenopathy LYMPH:  no palpable lymphadenopathy, no hepatosplenomegaly LUNGS: clear to auscultation  HEART: regular rate & rhythm, no murmurs and no gallops ABDOMEN:abdomen soft, non-tender, normal bowel sounds and no masses or organomegaly BACK: Back symmetric, no curvature. EXTREMITIES:no joint deformities, effusion, or inflammation, no edema, no skin discoloration, no clubbing, no cyanosis  NEURO: alert & oriented x 3 with fluent speech, no focal  motor/sensory deficits  PERFORMANCE STATUS: ECOG 1  LABORATORY DATA: Lab Results  Component Value Date   WBC 9.2 09/25/2011   HGB 13.3 09/25/2011   HCT 39.7 09/25/2011   MCV 104.4* 09/25/2011   PLT 217 09/25/2011      Chemistry   No results found for this basename: NA, K, CL, CO2, BUN, CREATININE, GLU   No results found for this basename: CALCIUM, ALKPHOS, AST, ALT,  BILITOT       RADIOGRAPHIC STUDIES: No results found.  ASSESSMENT: This is a very pleasant 78 years old white male with macrocytosis probably secondary to vitamin B12 deficiency versus early myelodysplastic syndrome. The patient is currently asymptomatic.  PLAN: I have a lengthy discussion with the patient today about his condition. I gave him the option of proceeding with a bone marrow biopsy and aspirate versus observation and consideration of the biopsy if he has any significant abnormalities in the future. He would like to wait and continue on observation for now. He also would like to transfer his care to [facility]. He would also continue on monthly vitamin B12 injection under the care of Dr. Christell Constant. I would see him back for followup visit in 3 months with repeat CBC, comprehensive metabolic panel and LDH. He was advised to call me immediately if he has any concerning symptoms in the interval.  All questions were answered. The patient knows to call the clinic with any problems, questions or concerns. We can certainly see the patient much sooner if necessary.  Thank you so much for allowing me to participate in the care of Jon Reid. I will continue to follow up the patient with you and assist in his care.  I spent 30 minutes counseling the patient face to face. The total time spent in the appointment was 55 minutes.   Marchell Froman K. 09/25/2011, 3:04 PM

## 2011-09-25 NOTE — Telephone Encounter (Signed)
appts made and printed for pt aom °

## 2011-10-22 DIAGNOSIS — E538 Deficiency of other specified B group vitamins: Secondary | ICD-10-CM | POA: Diagnosis not present

## 2011-11-27 DIAGNOSIS — I10 Essential (primary) hypertension: Secondary | ICD-10-CM | POA: Diagnosis not present

## 2011-11-27 DIAGNOSIS — R7989 Other specified abnormal findings of blood chemistry: Secondary | ICD-10-CM | POA: Diagnosis not present

## 2011-11-27 DIAGNOSIS — E538 Deficiency of other specified B group vitamins: Secondary | ICD-10-CM | POA: Diagnosis not present

## 2011-11-27 DIAGNOSIS — E559 Vitamin D deficiency, unspecified: Secondary | ICD-10-CM | POA: Diagnosis not present

## 2011-11-27 DIAGNOSIS — E785 Hyperlipidemia, unspecified: Secondary | ICD-10-CM | POA: Diagnosis not present

## 2011-12-31 DIAGNOSIS — Z23 Encounter for immunization: Secondary | ICD-10-CM | POA: Diagnosis not present

## 2011-12-31 DIAGNOSIS — E538 Deficiency of other specified B group vitamins: Secondary | ICD-10-CM | POA: Diagnosis not present

## 2012-01-07 ENCOUNTER — Other Ambulatory Visit: Payer: Self-pay | Admitting: *Deleted

## 2012-01-07 DIAGNOSIS — D7589 Other specified diseases of blood and blood-forming organs: Secondary | ICD-10-CM

## 2012-01-08 ENCOUNTER — Telehealth: Payer: Self-pay | Admitting: Internal Medicine

## 2012-01-08 ENCOUNTER — Other Ambulatory Visit (HOSPITAL_BASED_OUTPATIENT_CLINIC_OR_DEPARTMENT_OTHER): Payer: MEDICARE | Admitting: Lab

## 2012-01-08 ENCOUNTER — Ambulatory Visit (HOSPITAL_BASED_OUTPATIENT_CLINIC_OR_DEPARTMENT_OTHER): Payer: MEDICARE | Admitting: Internal Medicine

## 2012-01-08 VITALS — BP 147/79 | HR 70 | Temp 97.2°F | Resp 20 | Ht 69.5 in | Wt 179.6 lb

## 2012-01-08 DIAGNOSIS — D7589 Other specified diseases of blood and blood-forming organs: Secondary | ICD-10-CM

## 2012-01-08 HISTORY — DX: Other specified diseases of blood and blood-forming organs: D75.89

## 2012-01-08 LAB — CBC WITH DIFFERENTIAL/PLATELET
Basophils Absolute: 0.1 10*3/uL (ref 0.0–0.1)
Eosinophils Absolute: 0.1 10*3/uL (ref 0.0–0.5)
HGB: 13.7 g/dL (ref 13.0–17.1)
MCV: 103 fL — ABNORMAL HIGH (ref 79.3–98.0)
MONO#: 0.6 10*3/uL (ref 0.1–0.9)
NEUT#: 4.1 10*3/uL (ref 1.5–6.5)
RBC: 3.89 10*6/uL — ABNORMAL LOW (ref 4.20–5.82)
RDW: 11.9 % (ref 11.0–14.6)
WBC: 7.1 10*3/uL (ref 4.0–10.3)

## 2012-01-08 LAB — COMPREHENSIVE METABOLIC PANEL (CC13)
Albumin: 4.4 g/dL (ref 3.5–5.0)
BUN: 23 mg/dL (ref 7.0–26.0)
CO2: 22 mEq/L (ref 22–29)
Calcium: 9.6 mg/dL (ref 8.4–10.4)
Chloride: 104 mEq/L (ref 98–107)
Glucose: 108 mg/dl — ABNORMAL HIGH (ref 70–99)
Potassium: 4.3 mEq/L (ref 3.5–5.1)
Sodium: 137 mEq/L (ref 136–145)
Total Protein: 7 g/dL (ref 6.4–8.3)

## 2012-01-08 LAB — LACTATE DEHYDROGENASE (CC13): LDH: 199 U/L (ref 125–220)

## 2012-01-08 NOTE — Progress Notes (Signed)
[facility] [phone]   [phone]  OFFICE PROGRESS NOTE  DIAGNOSIS: Macrocytosis without anemia  PRIOR THERAPY: None  CURRENT THERAPY: Vitamin B 12 1000 mcg intramuscular monthly basis under the care of Dr. Christell Constant  INTERVAL HISTORY: Jon Reid 78 y.o. male returns to the clinic today for routine three-month followup visit. The patient is feeling fine today with no specific complaints. He denied having any significant fatigue or weakness. He denied having any chest pain, shortness breath, cough or hemoptysis. No significant weight loss or night sweats and no bleeding issues. He has repeat CBC performed earlier today and he is here for evaluation and discussion of his lab results.  MEDICAL HISTORY: Past Medical History  Diagnosis Date  . Unspecified essential hypertension   . Electrocardiogram abnormal   . Renal insufficiency   . Sleep apnea     Questionable  . Carcinoma of prostate     ALLERGIES:   has no allergies on file.  MEDICATIONS:  Current Outpatient Prescriptions  Medication Sig Dispense Refill  . cholecalciferol (VITAMIN D) 1000 UNITS tablet Take 2,000 Units by mouth daily.      . cyanocobalamin 1000 MCG tablet Inject 100 mcg into the muscle every 30 (thirty) days.      . DULoxetine (CYMBALTA) 30 MG capsule Take 30 mg by mouth 3 (three) times a week.      . olmesartan-hydrochlorothiazide (BENICAR HCT) 20-12.5 MG per tablet Take 1 tablet by mouth daily.          SURGICAL HISTORY:  Past Surgical History  Procedure Date  . Retropubic prostatectomy     Radical  . Lymph node dissection     Bilateral pelvic    REVIEW OF SYSTEMS:  A comprehensive review of systems was negative.   PHYSICAL EXAMINATION: General appearance: alert, cooperative and no distress Neck: no adenopathy Lymph nodes: Cervical, supraclavicular, and axillary nodes normal. Resp: clear to auscultation bilaterally Cardio: regular rate and rhythm, S1, S2  normal, no murmur, click, rub or gallop GI: soft, non-tender; bowel sounds normal; no masses,  no organomegaly Extremities: extremities normal, atraumatic, no cyanosis or edema  ECOG PERFORMANCE STATUS: 0 - Asymptomatic  Blood pressure 147/79, pulse 70, temperature 97.2 F (36.2 C), temperature source Oral, resp. rate 20, height 5' 9.5" (1.765 m), weight 179 lb 9.6 oz (81.466 kg).  LABORATORY DATA: Lab Results  Component Value Date   WBC 7.1 01/08/2012   HGB 13.7 01/08/2012   HCT 40.0 01/08/2012   MCV 103.0* 01/08/2012   PLT 242 01/08/2012      Chemistry      Component Value Date/Time   NA 140 09/25/2011 1312   K 4.4 09/25/2011 1312   CL 104 09/25/2011 1312   CO2 24 09/25/2011 1312   BUN 27* 09/25/2011 1312   CREATININE 1.61* 09/25/2011 1312      Component Value Date/Time   CALCIUM 9.2 09/25/2011 1312   ALKPHOS 64 09/25/2011 1312   AST 23 09/25/2011 1312   ALT 14 09/25/2011 1312   BILITOT 0.9 09/25/2011 1312       RADIOGRAPHIC STUDIES: No results found.  ASSESSMENT: This is a very pleasant 78 years old white male with history of macrocytosis without anemia most likely secondary to nutritional deficiency and he is currently on treatment with vitamin B12 injection on monthly basis. His lab results today are stable and the patient is currently asymptomatic.  PLAN: I discussed the lab result with the patient today.  I recommended for him to continue on observation for now with repeat CBC, comprehensive metabolic panel and LDH in 3 months. If the patient has any significant symptoms related to his condition I may consider him for a bone marrow biopsy and aspirate for further evaluation. He was advised to call immediately if he has any concerning symptoms in the interval.  All questions were answered. The patient knows to call the clinic with any problems, questions or concerns. We can certainly see the patient much sooner if necessary.

## 2012-01-08 NOTE — Patient Instructions (Signed)
Your labwork are stable today. Followup by observation and repeat blood work in 3 months

## 2012-01-08 NOTE — Telephone Encounter (Signed)
appts made and printed for pt  °

## 2012-01-28 DIAGNOSIS — E538 Deficiency of other specified B group vitamins: Secondary | ICD-10-CM | POA: Diagnosis not present

## 2012-03-06 DIAGNOSIS — E559 Vitamin D deficiency, unspecified: Secondary | ICD-10-CM | POA: Diagnosis not present

## 2012-03-06 DIAGNOSIS — Q068 Other specified congenital malformations of spinal cord: Secondary | ICD-10-CM | POA: Diagnosis not present

## 2012-03-06 DIAGNOSIS — Z79899 Other long term (current) drug therapy: Secondary | ICD-10-CM | POA: Diagnosis not present

## 2012-03-06 DIAGNOSIS — R5381 Other malaise: Secondary | ICD-10-CM | POA: Diagnosis not present

## 2012-03-06 DIAGNOSIS — Z125 Encounter for screening for malignant neoplasm of prostate: Secondary | ICD-10-CM | POA: Diagnosis not present

## 2012-03-06 DIAGNOSIS — I10 Essential (primary) hypertension: Secondary | ICD-10-CM | POA: Diagnosis not present

## 2012-03-10 DIAGNOSIS — Z1212 Encounter for screening for malignant neoplasm of rectum: Secondary | ICD-10-CM | POA: Diagnosis not present

## 2012-04-04 ENCOUNTER — Telehealth: Payer: Self-pay | Admitting: Internal Medicine

## 2012-04-04 NOTE — Telephone Encounter (Signed)
l/m that mkm will be out of the office on 1/23 and has r/s to 1/30     Jon Reid

## 2012-04-10 ENCOUNTER — Ambulatory Visit: Payer: MEDICARE | Admitting: Internal Medicine

## 2012-04-10 ENCOUNTER — Other Ambulatory Visit: Payer: MEDICARE | Admitting: Lab

## 2012-04-15 DIAGNOSIS — E538 Deficiency of other specified B group vitamins: Secondary | ICD-10-CM | POA: Diagnosis not present

## 2012-04-17 ENCOUNTER — Other Ambulatory Visit (HOSPITAL_BASED_OUTPATIENT_CLINIC_OR_DEPARTMENT_OTHER): Payer: MEDICARE | Admitting: Lab

## 2012-04-17 ENCOUNTER — Encounter: Payer: Self-pay | Admitting: Internal Medicine

## 2012-04-17 ENCOUNTER — Ambulatory Visit (HOSPITAL_BASED_OUTPATIENT_CLINIC_OR_DEPARTMENT_OTHER): Payer: MEDICARE | Admitting: Internal Medicine

## 2012-04-17 VITALS — BP 140/64 | HR 69 | Temp 97.0°F | Resp 20 | Ht 69.5 in | Wt 185.7 lb

## 2012-04-17 DIAGNOSIS — D7589 Other specified diseases of blood and blood-forming organs: Secondary | ICD-10-CM

## 2012-04-17 LAB — CBC WITH DIFFERENTIAL/PLATELET
Basophils Absolute: 0 10*3/uL (ref 0.0–0.1)
EOS%: 0.6 % (ref 0.0–7.0)
Eosinophils Absolute: 0.1 10*3/uL (ref 0.0–0.5)
LYMPH%: 23.8 % (ref 14.0–49.0)
MCHC: 34.3 g/dL (ref 32.0–36.0)
MONO#: 1 10*3/uL — ABNORMAL HIGH (ref 0.1–0.9)
MONO%: 11.9 % (ref 0.0–14.0)
NEUT#: 5.5 10*3/uL (ref 1.5–6.5)
NEUT%: 63.2 % (ref 39.0–75.0)
RBC: 3.83 10*6/uL — ABNORMAL LOW (ref 4.20–5.82)
RDW: 12.3 % (ref 11.0–14.6)
WBC: 8.8 10*3/uL (ref 4.0–10.3)

## 2012-04-17 LAB — COMPREHENSIVE METABOLIC PANEL (CC13)
AST: 25 U/L (ref 5–34)
BUN: 23.9 mg/dL (ref 7.0–26.0)
Calcium: 9.3 mg/dL (ref 8.4–10.4)
Chloride: 105 mEq/L (ref 98–107)
Creatinine: 1.5 mg/dL — ABNORMAL HIGH (ref 0.7–1.3)
Total Bilirubin: 0.97 mg/dL (ref 0.20–1.20)

## 2012-04-17 NOTE — Progress Notes (Signed)
[facility] [phone]   [phone]  OFFICE PROGRESS NOTE  DIAGNOSIS: Macrocytosis without anemia   PRIOR THERAPY: None   CURRENT THERAPY: Vitamin B 12 1000 mcg intramuscular monthly basis under the care of Dr. Christell Constant  INTERVAL HISTORY: Jon Reid 79 y.o. male returns to the clinic today for routine followup visit. The patient is feeling fine with no specific complaints. He denied having any fever or chills, no nausea or vomiting, no weight loss or night sweats. He denied chest pain, shortness breath, cough or hemoptysis. He is tolerating his vitamin B12 injection monthly with no significant adverse effects. He has repeat CBC performed earlier today and he is here for evaluation and discussion of his lab results.  MEDICAL HISTORY: Past Medical History  Diagnosis Date  . Unspecified essential hypertension   . Electrocardiogram abnormal   . Renal insufficiency   . Sleep apnea     Questionable  . Carcinoma of prostate     ALLERGIES:   has no allergies on file.  MEDICATIONS:  Current Outpatient Prescriptions  Medication Sig Dispense Refill  . cholecalciferol (VITAMIN D) 1000 UNITS tablet Take 2,000 Units by mouth daily.      . cyanocobalamin 1000 MCG tablet Inject 100 mcg into the muscle every 30 (thirty) days.      . DULoxetine (CYMBALTA) 30 MG capsule Take 30 mg by mouth 3 (three) times a week.      . olmesartan-hydrochlorothiazide (BENICAR HCT) 20-12.5 MG per tablet Take 1 tablet by mouth daily.          SURGICAL HISTORY:  Past Surgical History  Procedure Date  . Retropubic prostatectomy     Radical  . Lymph node dissection     Bilateral pelvic    REVIEW OF SYSTEMS:  A comprehensive review of systems was negative.   PHYSICAL EXAMINATION: General appearance: alert, cooperative and no distress Head: Normocephalic, without obvious abnormality, atraumatic Neck: no adenopathy Resp: clear to auscultation bilaterally Cardio:  regular rate and rhythm, S1, S2 normal, no murmur, click, rub or gallop GI: soft, non-tender; bowel sounds normal; no masses,  no organomegaly Extremities: extremities normal, atraumatic, no cyanosis or edema Neurologic: Alert and oriented X 3, normal strength and tone. Normal symmetric reflexes. Normal coordination and gait  ECOG PERFORMANCE STATUS: 0 - Asymptomatic  Blood pressure 140/64, pulse 69, temperature 97 F (36.1 C), temperature source Oral, resp. rate 20, height 5' 9.5" (1.765 m), weight 185 lb 11.2 oz (84.233 kg).  LABORATORY DATA: Lab Results  Component Value Date   WBC 8.8 04/17/2012   HGB 13.5 04/17/2012   HCT 39.2 04/17/2012   MCV 102.4* 04/17/2012   PLT 217 04/17/2012      Chemistry      Component Value Date/Time   NA 140 04/17/2012 1110   NA 140 09/25/2011 1312   K 4.1 04/17/2012 1110   K 4.4 09/25/2011 1312   CL 105 04/17/2012 1110   CL 104 09/25/2011 1312   CO2 25 04/17/2012 1110   CO2 24 09/25/2011 1312   BUN 23.9 04/17/2012 1110   BUN 27* 09/25/2011 1312   CREATININE 1.5* 04/17/2012 1110   CREATININE 1.61* 09/25/2011 1312      Component Value Date/Time   CALCIUM 9.3 04/17/2012 1110   CALCIUM 9.2 09/25/2011 1312   ALKPHOS 89 04/17/2012 1110   ALKPHOS 64 09/25/2011 1312   AST 25 04/17/2012 1110   AST 23 09/25/2011 1312   ALT 12 04/17/2012  1110   ALT 14 09/25/2011 1312   BILITOT 0.97 04/17/2012 1110   BILITOT 0.9 09/25/2011 1312       RADIOGRAPHIC STUDIES: No results found.  ASSESSMENT:  This is a very pleasant 79 years old white male with history of macrocytosis of unclear clinical significance.  He is currently on treatment with vitamin B12 injection a monthly basis and tolerating it fairly well. No significant change in his blood count.  PLAN: I discussed the lab result with the patient today. I recommended for him to continue his routine followup visit with his primary care physician Dr. Christell Constant. I will be happy to see the patient in the future on an as-needed basis. He was  advised to call if he has any concerning symptoms.  All questions were answered. The patient knows to call the clinic with any problems, questions or concerns. We can certainly see the patient much sooner if necessary.

## 2012-04-17 NOTE — Patient Instructions (Signed)
No significant change in her blood count. Continue observation with your primary care physician. Followup with me on as-needed basis.

## 2012-04-28 DIAGNOSIS — R32 Unspecified urinary incontinence: Secondary | ICD-10-CM | POA: Diagnosis not present

## 2012-04-28 DIAGNOSIS — C61 Malignant neoplasm of prostate: Secondary | ICD-10-CM | POA: Diagnosis not present

## 2012-05-14 DIAGNOSIS — E538 Deficiency of other specified B group vitamins: Secondary | ICD-10-CM | POA: Diagnosis not present

## 2012-06-10 ENCOUNTER — Ambulatory Visit (INDEPENDENT_AMBULATORY_CARE_PROVIDER_SITE_OTHER): Payer: MEDICARE | Admitting: *Deleted

## 2012-06-10 DIAGNOSIS — E538 Deficiency of other specified B group vitamins: Secondary | ICD-10-CM | POA: Diagnosis not present

## 2012-06-10 DIAGNOSIS — D469 Myelodysplastic syndrome, unspecified: Secondary | ICD-10-CM

## 2012-06-10 MED ORDER — CYANOCOBALAMIN 1000 MCG/ML IJ SOLN
1000.0000 ug | Freq: Once | INTRAMUSCULAR | Status: AC
  Administered 2012-06-10: 1000 ug via INTRAMUSCULAR

## 2012-06-10 NOTE — Progress Notes (Signed)
Pt tolerated well

## 2012-06-10 NOTE — Patient Instructions (Addendum)
Vitamin B12 Injections Every person needs vitamin B12. A deficiency develops when the body does not get enough of it. One way to overcome this is by getting B12 shots (injections). A B12 shot puts the vitamin directly into muscle tissue. This avoids any problems your body might have in absorbing it from food or a pill. In some people, the body has trouble using the vitamin correctly. This can cause a B12 deficiency. Not consuming enough of the vitamin can also cause a deficiency. Getting enough vitamin B12 can be hard for elderly people. Sometimes, they do not eat a well-balanced diet. The elderly are also more likely than younger people to have medical conditions or take medications that can lead to a deficiency. WHAT DOES VITAMIN B12 DO? Vitamin B12 does many things to help the body work right:  It helps the body make healthy red blood cells.  It helps maintain nerve cells.  It is involved in the body's process of converting food into energy (metabolism).  It is needed to make the genetic material in all cells (DNA). VITAMIN B12 FOOD SOURCES Most people get plenty of vitamin B12 through the foods they eat. It is present in:  Meat, fish, poultry, and eggs.  Milk and milk products.  It also is added when certain foods are made, including some breads, cereals and yogurts. The food is then called "fortified". CAUSES The most common causes of vitamin B12 deficiency are:  Pernicious anemia. The condition develops when the body cannot make enough healthy red blood cells. This stems from a lack of a protein made in the stomach (intrinsic factor). People without this protein cannot absorb enough vitamin B12 from food.  Malabsorption. This is when the body cannot absorb the vitamin. It can be caused by:  Pernicious anemia.  Surgery to remove part or all of the stomach can lead to malabsorption. Removal of part or all of the small intestine can also cause malabsorption.  Vegetarian diet.  People who are strict about not eating foods from animals could have trouble taking in enough vitamin B12 from diet alone.  Medications. Some medicines have been linked to B12 deficiency, such as Metformin (a drug prescribed for type 2 diabetes). Long-term use of stomach acid suppressants also can keep the vitamin from being absorbed.  Intestinal problems such as inflammatory bowel disease. If there are problems in the digestive tract, vitamin B12 may not be absorbed in good enough amounts. SYMPTOMS People who do not get enough B12 can develop problems. These can include:  Anemia. This is when the body has too few red blood cells. Red blood cells carry oxygen to the rest of the body. Without a healthy supply of red blood cells, people can feel:  Tired (fatigued).  Weak.  Severe anemia can cause:  Shortness of breath.  Dizziness.  Rapid heart rate.  Paleness.  Other Vitamin B12 deficiency symptoms include:  Diarrhea.  Numbness or tingling in the hands or feet.  Loss of appetite.  Confusion.  Sores on the tongue or in the mouth. LET YOUR CAREGIVER KNOW ABOUT:  Any allergies. It is very important to know if you are allergic or sensitive to cobalt. Vitamin B12 contains cobalt.  Any history of kidney disease.  All medications you are taking. Include prescription and over-the-counter medicines, herbs and creams.  Whether you are pregnant or breast-feeding.  If you have Leber's disease, a hereditary eye condition, vitamin B12 could make it worse. RISKS AND COMPLICATIONS Reactions to an injection are   usually temporary. They might include:  Pain at the injection site.  Redness, swelling or tenderness at the site.  Headache, dizziness or weakness.  Nausea, upset stomach or diarrhea.  Numbness or tingling.  Fever.  Joint pain.  Itching or rash. If a reaction does not go away in a short while, talk with your healthcare provider. A change in the way the shots are  given, or where they are given, might need to be made. BEFORE AN INJECTION To decide whether B12 injections are right for you, your healthcare provider will probably:  Ask about your medical history.  Ask questions about your diet.  Ask about symptoms such as:  Have you felt weak?  Do you feel unusually tired?  Do you get dizzy?  Order blood tests. These may include a test to:  Check the level of red cells in your blood.  Measure B12 levels.  Check for the presence of intrinsic factor. VITAMIN B12 INJECTIONS How often you will need a vitamin B12 injection will depend on how severe your deficiency is. This also will affect how long you will need to get them. People with pernicious anemia usually get injections for their entire life. Others might get them for a shorter period. For many people, injections are given daily or weekly for several weeks. Then, once B12 levels are normal, injections are given just once a month. If the cause of the deficiency can be fixed, the injections can be stopped. Talk with your healthcare provider about what you should expect. For an injection:  The injection site will be cleaned with an alcohol swab.  Your healthcare provider will insert a needle directly into a muscle. Most any muscle can be used. Most often, an arm muscle is used. A buttocks muscle can also be used. Many people say shots in that area are less painful.  A small adhesive bandage may be put over the injection site. It usually can be taken off in an hour or less. Injections can be given by your healthcare provider. In some cases, family members give them. Sometimes, people give them to themselves. Talk with your healthcare provider about what would be best for you. If someone other than your healthcare provider will be giving the shots, the person will need to be trained to give them correctly. HOME CARE INSTRUCTIONS   You can remove the adhesive bandage within an hour of getting a  shot.  You should be able to go about your normal activities right away.  Avoid drinking large amounts of alcohol while taking vitamin B12 shots. Alcohol can interfere with the body's use of the vitamin. SEEK MEDICAL CARE IF:   Pain, redness, swelling or tenderness at the injection site does not get better or gets worse.  Headache, dizziness or weakness does not go away.  You develop a fever of more than 100.5 F (38.1 C). SEEK IMMEDIATE MEDICAL CARE IF:   You have chest pain.  You develop shortness of breath.  You have muscle weakness that gets worse.  You develop numbness, weakness or tingling on one side or one area of the body.  You have symptoms of an allergic reaction, such as:  Hives.  Difficulty breathing.  Swelling of the lips, face, tongue or throat.  You develop a fever of more than 102.0 F (38.9 C). MAKE SURE YOU:   Understand these instructions.  Will watch your condition.  Will get help right away if you are not doing well or get worse. Document   Released: 06/01/2008 Document Revised: 05/28/2011 Document Reviewed: 06/01/2008 [employer] Patient Information 2013 [employer].  

## 2012-06-25 ENCOUNTER — Telehealth: Payer: Self-pay | Admitting: Family Medicine

## 2012-06-28 NOTE — Telephone Encounter (Signed)
Spoke with pt's wife Pt saw Dr Ulice Brilliant and is feeling better

## 2012-07-07 ENCOUNTER — Encounter: Payer: Self-pay | Admitting: Family Medicine

## 2012-07-07 ENCOUNTER — Ambulatory Visit (INDEPENDENT_AMBULATORY_CARE_PROVIDER_SITE_OTHER): Payer: MEDICARE | Admitting: Family Medicine

## 2012-07-07 VITALS — BP 154/74 | HR 71 | Temp 98.0°F | Ht 69.5 in | Wt 181.8 lb

## 2012-07-07 DIAGNOSIS — D7589 Other specified diseases of blood and blood-forming organs: Secondary | ICD-10-CM | POA: Insufficient documentation

## 2012-07-07 DIAGNOSIS — R5381 Other malaise: Secondary | ICD-10-CM

## 2012-07-07 DIAGNOSIS — E785 Hyperlipidemia, unspecified: Secondary | ICD-10-CM

## 2012-07-07 DIAGNOSIS — E559 Vitamin D deficiency, unspecified: Secondary | ICD-10-CM | POA: Diagnosis not present

## 2012-07-07 DIAGNOSIS — R5383 Other fatigue: Secondary | ICD-10-CM

## 2012-07-07 DIAGNOSIS — R350 Frequency of micturition: Secondary | ICD-10-CM | POA: Diagnosis not present

## 2012-07-07 DIAGNOSIS — I10 Essential (primary) hypertension: Secondary | ICD-10-CM

## 2012-07-07 DIAGNOSIS — E538 Deficiency of other specified B group vitamins: Secondary | ICD-10-CM

## 2012-07-07 LAB — POCT UA - MICROSCOPIC ONLY: WBC, Ur, HPF, POC: NEGATIVE

## 2012-07-07 LAB — POCT URINALYSIS DIPSTICK
Glucose, UA: NEGATIVE
Nitrite, UA: NEGATIVE
Spec Grav, UA: 1.01
Urobilinogen, UA: NEGATIVE

## 2012-07-07 LAB — POCT CBC
HCT, POC: 35.8 % — AB (ref 43.5–53.7)
Hemoglobin: 12.2 g/dL — AB (ref 14.1–18.1)
MCH, POC: 34.3 pg — AB (ref 27–31.2)
MCV: 100.6 fL — AB (ref 80–97)
RBC: 3.6 M/uL — AB (ref 4.69–6.13)

## 2012-07-07 LAB — BASIC METABOLIC PANEL WITH GFR
CO2: 24 mEq/L (ref 19–32)
Chloride: 104 mEq/L (ref 96–112)
Creat: 1.51 mg/dL — ABNORMAL HIGH (ref 0.50–1.35)

## 2012-07-07 LAB — HEPATIC FUNCTION PANEL
ALT: 10 U/L (ref 0–53)
AST: 17 U/L (ref 0–37)
Alkaline Phosphatase: 74 U/L (ref 39–117)
Indirect Bilirubin: 0.4 mg/dL (ref 0.0–0.9)
Total Protein: 6.8 g/dL (ref 6.0–8.3)

## 2012-07-07 NOTE — Patient Instructions (Addendum)
Continue current meds. Continue therapeutic lifestyle change Always be careful and don't climb and risk the chance of falls

## 2012-07-07 NOTE — Progress Notes (Signed)
  Subjective:    Patient ID: Kennon Rounds, male    DOB: 08-Jan-1929, 79 y.o.   MRN: [id number]  HPI This patient presents for recheck of multiple medical problems. No one accompanies the patient today. See the note from Dr. Shirline Frees.  Patient Active Problem List  Diagnosis  . CARCINOMA, PROSTATE  . VASCULAR PURPURA  . HYPERTENSION, UNSPECIFIED  . PVD  . RENAL INSUFFICIENCY  . SLEEP APNEA  . ELECTROCARDIOGRAM, ABNORMAL  . Macrocytosis without anemia    In addition,See ROS.  The allergies, current medications, past medical history, surgical history, family and social history are reviewed.  Immunizations reviewed.  Health maintenance reviewed.  The following items are outstanding:zostavax  History of fracture of metatarsal left foot fourth toe, wearing a fracture shoe and may be getting some better.    Review of Systems  Constitutional: Positive for fatigue (increased).  HENT: Negative.   Eyes: Negative.   Respiratory: Negative.   Cardiovascular: Negative.   Gastrointestinal: Negative.   Genitourinary: Positive for frequency. Negative for dysuria.  Musculoskeletal: Positive for arthralgias (L foot with redness).  Allergic/Immunologic: Negative.   Neurological: Negative.   Psychiatric/Behavioral: Negative.        Objective:   Physical Exam BP 154/74  Pulse 71  Temp(Src) 98 F (36.7 C) (Oral)  Ht 5' 9.5" (1.765 m)  Wt 181 lb 12.8 oz (82.464 kg)  BMI 26.47 kg/m2  The patient appeared well nourished and normally developed, alert and oriented to time and place. Speech, behavior and judgement appear normal. Vital signs as documented.  Head exam is unremarkable. No scleral icterus or pallor noted.  Neck is without jugular venous distension, thyromegally, or carotid bruits. Carotid upstrokes are brisk bilaterally. No cervical adenopathy. Lungs are clear anteriorly and posteriorly to auscultation. Normal respiratory effort. Cardiac exam reveals regular rate and  rhythm. First and second heart sounds normal. No murmurs, rubs or gallops.  Abdominal exam reveals normal bowl sounds, no masses, no organomegaly and no aortic enlargement. No inguinal adenopathy. Extremities are nonedematous and both femoral and pedal pulses are normal. Skin without pallor or jaundice.  Warm and dry, without rash. Neurologic exam reveals normal deep tendon reflexes and normal sensation.   Blood pressures have been running in the 1:30 to 140 range for the systolic and 50-60 range with diastolic       Assessment & Plan:  1. Fatigue - POCT CBC; Standing - Vitamin D 25 hydroxy; Standing - BASIC METABOLIC PANEL WITH GFR; Standing - POCT CBC - Vitamin D 25 hydroxy - BASIC METABOLIC PANEL WITH GFR  2. Urinary frequency - POCT urinalysis dipstick - POCT UA - Microscopic Only  3. Essential hypertension, benign - BASIC METABOLIC PANEL WITH GFR; Standing - BASIC METABOLIC PANEL WITH GFR  4. Hyperlipemia - Hepatic function panel; Standing - NMR Lipoprofile with Lipids; Standing - Hepatic function panel - NMR Lipoprofile with Lipids  5. Vitamin B 12 deficiency  6. Macrocytosis  7.Do rectal exam next visit  8. We'll check with insurance on Zostavax

## 2012-07-08 ENCOUNTER — Encounter: Payer: Self-pay | Admitting: Family Medicine

## 2012-07-08 LAB — NMR LIPOPROFILE WITH LIPIDS
Cholesterol, Total: 147 mg/dL (ref <200)
HDL Particle Number: 30.5 umol/L (ref >=30.5)
LDL (calc): 77 mg/dL (ref <100)
LP-IR Score: <25 (ref <=45)
Large HDL-P: 9.7 umol/L (ref >=4.8)
Large VLDL-P: 2.2 nmol/L (ref <=2.7)
Small LDL Particle Number: 256 nmol/L (ref <=527)

## 2012-07-14 ENCOUNTER — Ambulatory Visit (INDEPENDENT_AMBULATORY_CARE_PROVIDER_SITE_OTHER): Payer: MEDICARE | Admitting: *Deleted

## 2012-07-14 ENCOUNTER — Telehealth: Payer: Self-pay | Admitting: *Deleted

## 2012-07-14 DIAGNOSIS — E538 Deficiency of other specified B group vitamins: Secondary | ICD-10-CM | POA: Diagnosis not present

## 2012-07-14 MED ORDER — CYANOCOBALAMIN 1000 MCG/ML IJ SOLN
1000.0000 ug | INTRAMUSCULAR | Status: DC
  Administered 2012-07-14 – 2012-11-24 (×5): 1000 ug via INTRAMUSCULAR

## 2012-07-14 NOTE — Telephone Encounter (Signed)
Pt's wife notified of result 

## 2012-07-14 NOTE — Patient Instructions (Addendum)
Vitamin B12 Injections Every person needs vitamin B12. A deficiency develops when the body does not get enough of it. One way to overcome this is by getting B12 shots (injections). A B12 shot puts the vitamin directly into muscle tissue. This avoids any problems your body might have in absorbing it from food or a pill. In some people, the body has trouble using the vitamin correctly. This can cause a B12 deficiency. Not consuming enough of the vitamin can also cause a deficiency. Getting enough vitamin B12 can be hard for elderly people. Sometimes, they do not eat a well-balanced diet. The elderly are also more likely than younger people to have medical conditions or take medications that can lead to a deficiency. WHAT DOES VITAMIN B12 DO? Vitamin B12 does many things to help the body work right:  It helps the body make healthy red blood cells.  It helps maintain nerve cells.  It is involved in the body's process of converting food into energy (metabolism).  It is needed to make the genetic material in all cells (DNA). VITAMIN B12 FOOD SOURCES Most people get plenty of vitamin B12 through the foods they eat. It is present in:  Meat, fish, poultry, and eggs.  Milk and milk products.  It also is added when certain foods are made, including some breads, cereals and yogurts. The food is then called "fortified". CAUSES The most common causes of vitamin B12 deficiency are:  Pernicious anemia. The condition develops when the body cannot make enough healthy red blood cells. This stems from a lack of a protein made in the stomach (intrinsic factor). People without this protein cannot absorb enough vitamin B12 from food.  Malabsorption. This is when the body cannot absorb the vitamin. It can be caused by:  Pernicious anemia.  Surgery to remove part or all of the stomach can lead to malabsorption. Removal of part or all of the small intestine can also cause malabsorption.  Vegetarian diet.  People who are strict about not eating foods from animals could have trouble taking in enough vitamin B12 from diet alone.  Medications. Some medicines have been linked to B12 deficiency, such as Metformin (a drug prescribed for type 2 diabetes). Long-term use of stomach acid suppressants also can keep the vitamin from being absorbed.  Intestinal problems such as inflammatory bowel disease. If there are problems in the digestive tract, vitamin B12 may not be absorbed in good enough amounts. SYMPTOMS People who do not get enough B12 can develop problems. These can include:  Anemia. This is when the body has too few red blood cells. Red blood cells carry oxygen to the rest of the body. Without a healthy supply of red blood cells, people can feel:  Tired (fatigued).  Weak.  Severe anemia can cause:  Shortness of breath.  Dizziness.  Rapid heart rate.  Paleness.  Other Vitamin B12 deficiency symptoms include:  Diarrhea.  Numbness or tingling in the hands or feet.  Loss of appetite.  Confusion.  Sores on the tongue or in the mouth. LET YOUR CAREGIVER KNOW ABOUT:  Any allergies. It is very important to know if you are allergic or sensitive to cobalt. Vitamin B12 contains cobalt.  Any history of kidney disease.  All medications you are taking. Include prescription and over-the-counter medicines, herbs and creams.  Whether you are pregnant or breast-feeding.  If you have Leber's disease, a hereditary eye condition, vitamin B12 could make it worse. RISKS AND COMPLICATIONS Reactions to an injection are   usually temporary. They might include:  Pain at the injection site.  Redness, swelling or tenderness at the site.  Headache, dizziness or weakness.  Nausea, upset stomach or diarrhea.  Numbness or tingling.  Fever.  Joint pain.  Itching or rash. If a reaction does not go away in a short while, talk with your healthcare provider. A change in the way the shots are  given, or where they are given, might need to be made. BEFORE AN INJECTION To decide whether B12 injections are right for you, your healthcare provider will probably:  Ask about your medical history.  Ask questions about your diet.  Ask about symptoms such as:  Have you felt weak?  Do you feel unusually tired?  Do you get dizzy?  Order blood tests. These may include a test to:  Check the level of red cells in your blood.  Measure B12 levels.  Check for the presence of intrinsic factor. VITAMIN B12 INJECTIONS How often you will need a vitamin B12 injection will depend on how severe your deficiency is. This also will affect how long you will need to get them. People with pernicious anemia usually get injections for their entire life. Others might get them for a shorter period. For many people, injections are given daily or weekly for several weeks. Then, once B12 levels are normal, injections are given just once a month. If the cause of the deficiency can be fixed, the injections can be stopped. Talk with your healthcare provider about what you should expect. For an injection:  The injection site will be cleaned with an alcohol swab.  Your healthcare provider will insert a needle directly into a muscle. Most any muscle can be used. Most often, an arm muscle is used. A buttocks muscle can also be used. Many people say shots in that area are less painful.  A small adhesive bandage may be put over the injection site. It usually can be taken off in an hour or less. Injections can be given by your healthcare provider. In some cases, family members give them. Sometimes, people give them to themselves. Talk with your healthcare provider about what would be best for you. If someone other than your healthcare provider will be giving the shots, the person will need to be trained to give them correctly. HOME CARE INSTRUCTIONS   You can remove the adhesive bandage within an hour of getting a  shot.  You should be able to go about your normal activities right away.  Avoid drinking large amounts of alcohol while taking vitamin B12 shots. Alcohol can interfere with the body's use of the vitamin. SEEK MEDICAL CARE IF:   Pain, redness, swelling or tenderness at the injection site does not get better or gets worse.  Headache, dizziness or weakness does not go away.  You develop a fever of more than 100.5 F (38.1 C). SEEK IMMEDIATE MEDICAL CARE IF:   You have chest pain.  You develop shortness of breath.  You have muscle weakness that gets worse.  You develop numbness, weakness or tingling on one side or one area of the body.  You have symptoms of an allergic reaction, such as:  Hives.  Difficulty breathing.  Swelling of the lips, face, tongue or throat.  You develop a fever of more than 102.0 F (38.9 C). MAKE SURE YOU:   Understand these instructions.  Will watch your condition.  Will get help right away if you are not doing well or get worse. Document   Released: 06/01/2008 Document Revised: 05/28/2011 Document Reviewed: 06/01/2008 [employer] Patient Information 2013 [employer].  

## 2012-07-14 NOTE — Telephone Encounter (Signed)
Message copied by Bearl Mulberry on Mon Jul 14, 2012  9:21 AM ------      Message from: Ernestina Penna      Created: Mon Jul 07, 2012  9:00 PM       Urinalysis within normal      CBC had a WBC that was within normal limits and a hemoglobin that was slightly decreased at 12.2      Platelet count is adequate      Recheck CBC in one month, patient does not have to be seen, and does not have to be fasting ------

## 2012-07-14 NOTE — Progress Notes (Signed)
Patient tolerated well.

## 2012-08-14 ENCOUNTER — Other Ambulatory Visit: Payer: MEDICARE

## 2012-08-14 ENCOUNTER — Encounter: Payer: Self-pay | Admitting: Family Medicine

## 2012-08-14 ENCOUNTER — Ambulatory Visit (INDEPENDENT_AMBULATORY_CARE_PROVIDER_SITE_OTHER): Payer: MEDICARE

## 2012-08-14 ENCOUNTER — Ambulatory Visit (INDEPENDENT_AMBULATORY_CARE_PROVIDER_SITE_OTHER): Payer: MEDICARE | Admitting: Family Medicine

## 2012-08-14 VITALS — BP 165/70 | HR 69 | Temp 97.7°F | Wt 181.6 lb

## 2012-08-14 DIAGNOSIS — R7989 Other specified abnormal findings of blood chemistry: Secondary | ICD-10-CM

## 2012-08-14 DIAGNOSIS — M79671 Pain in right foot: Secondary | ICD-10-CM

## 2012-08-14 DIAGNOSIS — S8990XA Unspecified injury of unspecified lower leg, initial encounter: Secondary | ICD-10-CM | POA: Diagnosis not present

## 2012-08-14 DIAGNOSIS — M79609 Pain in unspecified limb: Secondary | ICD-10-CM | POA: Diagnosis not present

## 2012-08-14 DIAGNOSIS — S99919A Unspecified injury of unspecified ankle, initial encounter: Secondary | ICD-10-CM

## 2012-08-14 DIAGNOSIS — S99921A Unspecified injury of right foot, initial encounter: Secondary | ICD-10-CM

## 2012-08-14 DIAGNOSIS — S99929A Unspecified injury of unspecified foot, initial encounter: Secondary | ICD-10-CM | POA: Diagnosis not present

## 2012-08-14 LAB — POCT CBC
Granulocyte percent: 61.4 %G (ref 37–80)
HCT, POC: 35.9 % — AB (ref 43.5–53.7)
POC Granulocyte: 4.8 (ref 2–6.9)
Platelet Count, POC: 260 10*3/uL (ref 142–424)
RDW, POC: 12.5 %
WBC: 7.8 10*3/uL (ref 4.6–10.2)

## 2012-08-14 NOTE — Progress Notes (Signed)
  Subjective:    Patient ID: Jon Reid, male    DOB: 02/04/1929, 79 y.o.   MRN: [id number]  HPI Patient comes in this morning for a lab draw. He came to the nursing station and showed [name] his right foot which was bruised and slightly swollen. It is also painful. He said that 4 days ago he hit his foot against a rocking chair.   Review of Systems He has a recent fracture early in May of the metatarsals on the left foot. He is seeing Dr. Ulice Brilliant a podiatrist for this. He is wearing a fracture shoe on this foot. The swelling in this foot has diminished somewhat over the past few days and he is taking medication for this.    Objective:   Physical Exam Right foot shows some edema and bruising in the plantar area and in the distal toes. It is tender anteriorly over the metatarsals to the right lateral calcaneus .  [facility] reading (PRIMARY) by  Dr.Moore:  Right foot  --no fracture observed                                    Assessment & Plan:  ent & Plan:   1.Right foot injury -X-ray right foot  2. Hypertension    Patient Instructions  Moist heat to foot and ankle Elevation as much as possible Firm sole shoe

## 2012-08-14 NOTE — Patient Instructions (Addendum)
Moist heat to foot and ankle Elevation as much as possible Firm sole shoe

## 2012-08-14 NOTE — Progress Notes (Signed)
Patient came in for re-check for cbc because of abnormal results

## 2012-08-18 ENCOUNTER — Ambulatory Visit (INDEPENDENT_AMBULATORY_CARE_PROVIDER_SITE_OTHER): Payer: MEDICARE

## 2012-08-18 DIAGNOSIS — E538 Deficiency of other specified B group vitamins: Secondary | ICD-10-CM | POA: Diagnosis not present

## 2012-08-18 NOTE — Progress Notes (Signed)
Tolerated inj well.

## 2012-08-18 NOTE — Patient Instructions (Signed)

## 2012-09-15 ENCOUNTER — Other Ambulatory Visit (INDEPENDENT_AMBULATORY_CARE_PROVIDER_SITE_OTHER): Payer: MEDICARE

## 2012-09-15 DIAGNOSIS — I10 Essential (primary) hypertension: Secondary | ICD-10-CM

## 2012-09-15 DIAGNOSIS — R5381 Other malaise: Secondary | ICD-10-CM | POA: Diagnosis not present

## 2012-09-15 DIAGNOSIS — E785 Hyperlipidemia, unspecified: Secondary | ICD-10-CM

## 2012-09-15 DIAGNOSIS — R5383 Other fatigue: Secondary | ICD-10-CM

## 2012-09-15 DIAGNOSIS — E559 Vitamin D deficiency, unspecified: Secondary | ICD-10-CM | POA: Diagnosis not present

## 2012-09-15 LAB — HEPATIC FUNCTION PANEL
ALT: 14 U/L (ref 0–53)
Total Protein: 6.9 g/dL (ref 6.0–8.3)

## 2012-09-15 LAB — BASIC METABOLIC PANEL WITH GFR
BUN: 23 mg/dL (ref 6–23)
CO2: 24 mEq/L (ref 19–32)
Calcium: 9.5 mg/dL (ref 8.4–10.5)
Chloride: 105 mEq/L (ref 96–112)
Creat: 1.5 mg/dL — ABNORMAL HIGH (ref 0.50–1.35)
Glucose, Bld: 113 mg/dL — ABNORMAL HIGH (ref 70–99)

## 2012-09-15 LAB — POCT CBC
Lymph, poc: 2.9 (ref 0.6–3.4)
MCH, POC: 35.1 pg — AB (ref 27–31.2)
MCV: 102.7 fL — AB (ref 80–97)
Platelet Count, POC: 249 10*3/uL (ref 142–424)
RDW, POC: 12.2 %
WBC: 8.9 10*3/uL (ref 4.6–10.2)

## 2012-09-16 LAB — VITAMIN D 25 HYDROXY (VIT D DEFICIENCY, FRACTURES): Vit D, 25-Hydroxy: 51 ng/mL (ref 30–89)

## 2012-09-17 LAB — NMR LIPOPROFILE WITH LIPIDS
Cholesterol, Total: 160 mg/dL (ref <200)
HDL Particle Number: 35.7 umol/L (ref >=30.5)
LDL (calc): 88 mg/dL (ref <100)
LDL Size: 20.7 nm (ref >20.5)
Large VLDL-P: 0.9 nmol/L (ref <=2.7)
Small LDL Particle Number: 314 nmol/L (ref <=527)

## 2012-09-18 ENCOUNTER — Ambulatory Visit (INDEPENDENT_AMBULATORY_CARE_PROVIDER_SITE_OTHER): Payer: MEDICARE | Admitting: *Deleted

## 2012-09-18 DIAGNOSIS — E538 Deficiency of other specified B group vitamins: Secondary | ICD-10-CM

## 2012-09-18 DIAGNOSIS — D649 Anemia, unspecified: Secondary | ICD-10-CM

## 2012-09-18 NOTE — Patient Instructions (Signed)
Vitamin B12 Injections Every person needs vitamin B12. A deficiency develops when the body does not get enough of it. One way to overcome this is by getting B12 shots (injections). A B12 shot puts the vitamin directly into muscle tissue. This avoids any problems your body might have in absorbing it from food or a pill. In some people, the body has trouble using the vitamin correctly. This can cause a B12 deficiency. Not consuming enough of the vitamin can also cause a deficiency. Getting enough vitamin B12 can be hard for elderly people. Sometimes, they do not eat a well-balanced diet. The elderly are also more likely than younger people to have medical conditions or take medications that can lead to a deficiency. WHAT DOES VITAMIN B12 DO? Vitamin B12 does many things to help the body work right:  It helps the body make healthy red blood cells.  It helps maintain nerve cells.  It is involved in the body's process of converting food into energy (metabolism).  It is needed to make the genetic material in all cells (DNA). VITAMIN B12 FOOD SOURCES Most people get plenty of vitamin B12 through the foods they eat. It is present in:  Meat, fish, poultry, and eggs.  Milk and milk products.  It also is added when certain foods are made, including some breads, cereals and yogurts. The food is then called "fortified". CAUSES The most common causes of vitamin B12 deficiency are:  Pernicious anemia. The condition develops when the body cannot make enough healthy red blood cells. This stems from a lack of a protein made in the stomach (intrinsic factor). People without this protein cannot absorb enough vitamin B12 from food.  Malabsorption. This is when the body cannot absorb the vitamin. It can be caused by:  Pernicious anemia.  Surgery to remove part or all of the stomach can lead to malabsorption. Removal of part or all of the small intestine can also cause malabsorption.  Vegetarian diet.  People who are strict about not eating foods from animals could have trouble taking in enough vitamin B12 from diet alone.  Medications. Some medicines have been linked to B12 deficiency, such as Metformin (a drug prescribed for type 2 diabetes). Long-term use of stomach acid suppressants also can keep the vitamin from being absorbed.  Intestinal problems such as inflammatory bowel disease. If there are problems in the digestive tract, vitamin B12 may not be absorbed in good enough amounts. SYMPTOMS People who do not get enough B12 can develop problems. These can include:  Anemia. This is when the body has too few red blood cells. Red blood cells carry oxygen to the rest of the body. Without a healthy supply of red blood cells, people can feel:  Tired (fatigued).  Weak.  Severe anemia can cause:  Shortness of breath.  Dizziness.  Rapid heart rate.  Paleness.  Other Vitamin B12 deficiency symptoms include:  Diarrhea.  Numbness or tingling in the hands or feet.  Loss of appetite.  Confusion.  Sores on the tongue or in the mouth. LET YOUR CAREGIVER KNOW ABOUT:  Any allergies. It is very important to know if you are allergic or sensitive to cobalt. Vitamin B12 contains cobalt.  Any history of kidney disease.  All medications you are taking. Include prescription and over-the-counter medicines, herbs and creams.  Whether you are pregnant or breast-feeding.  If you have Leber's disease, a hereditary eye condition, vitamin B12 could make it worse. RISKS AND COMPLICATIONS Reactions to an injection are   usually temporary. They might include:  Pain at the injection site.  Redness, swelling or tenderness at the site.  Headache, dizziness or weakness.  Nausea, upset stomach or diarrhea.  Numbness or tingling.  Fever.  Joint pain.  Itching or rash. If a reaction does not go away in a short while, talk with your healthcare provider. A change in the way the shots are  given, or where they are given, might need to be made. BEFORE AN INJECTION To decide whether B12 injections are right for you, your healthcare provider will probably:  Ask about your medical history.  Ask questions about your diet.  Ask about symptoms such as:  Have you felt weak?  Do you feel unusually tired?  Do you get dizzy?  Order blood tests. These may include a test to:  Check the level of red cells in your blood.  Measure B12 levels.  Check for the presence of intrinsic factor. VITAMIN B12 INJECTIONS How often you will need a vitamin B12 injection will depend on how severe your deficiency is. This also will affect how long you will need to get them. People with pernicious anemia usually get injections for their entire life. Others might get them for a shorter period. For many people, injections are given daily or weekly for several weeks. Then, once B12 levels are normal, injections are given just once a month. If the cause of the deficiency can be fixed, the injections can be stopped. Talk with your healthcare provider about what you should expect. For an injection:  The injection site will be cleaned with an alcohol swab.  Your healthcare provider will insert a needle directly into a muscle. Most any muscle can be used. Most often, an arm muscle is used. A buttocks muscle can also be used. Many people say shots in that area are less painful.  A small adhesive bandage may be put over the injection site. It usually can be taken off in an hour or less. Injections can be given by your healthcare provider. In some cases, family members give them. Sometimes, people give them to themselves. Talk with your healthcare provider about what would be best for you. If someone other than your healthcare provider will be giving the shots, the person will need to be trained to give them correctly. HOME CARE INSTRUCTIONS   You can remove the adhesive bandage within an hour of getting a  shot.  You should be able to go about your normal activities right away.  Avoid drinking large amounts of alcohol while taking vitamin B12 shots. Alcohol can interfere with the body's use of the vitamin. SEEK MEDICAL CARE IF:   Pain, redness, swelling or tenderness at the injection site does not get better or gets worse.  Headache, dizziness or weakness does not go away.  You develop a fever of more than 100.5 F (38.1 C). SEEK IMMEDIATE MEDICAL CARE IF:   You have chest pain.  You develop shortness of breath.  You have muscle weakness that gets worse.  You develop numbness, weakness or tingling on one side or one area of the body.  You have symptoms of an allergic reaction, such as:  Hives.  Difficulty breathing.  Swelling of the lips, face, tongue or throat.  You develop a fever of more than 102.0 F (38.9 C). MAKE SURE YOU:   Understand these instructions.  Will watch your condition.  Will get help right away if you are not doing well or get worse. Document   Released: 06/01/2008 Document Revised: 05/28/2011 Document Reviewed: 06/01/2008 [employer] Patient Information 2014 [employer].  

## 2012-09-24 NOTE — Progress Notes (Signed)
Patient came in for labs only.

## 2012-10-21 ENCOUNTER — Ambulatory Visit (INDEPENDENT_AMBULATORY_CARE_PROVIDER_SITE_OTHER): Payer: MEDICARE

## 2012-10-21 DIAGNOSIS — E538 Deficiency of other specified B group vitamins: Secondary | ICD-10-CM

## 2012-10-21 NOTE — Patient Instructions (Signed)

## 2012-10-21 NOTE — Progress Notes (Signed)
Tolerated b12 injection without difficulty

## 2012-11-03 ENCOUNTER — Telehealth: Payer: Self-pay | Admitting: Family Medicine

## 2012-11-04 MED ORDER — COLCHICINE 0.6 MG PO TABS: 0.6000 mg | ORAL_TABLET | Freq: Every day | ORAL | Status: DC

## 2012-11-04 MED ORDER — ALLOPURINOL 100 MG PO TABS: 100.0000 mg | ORAL_TABLET | Freq: Every day | ORAL | Status: DC

## 2012-11-04 NOTE — Telephone Encounter (Signed)
Moore to address 

## 2012-11-04 NOTE — Telephone Encounter (Signed)
Jon Reid, which medication is he referring to?? He cannot take NSAIDs because of CKD. B node which medication and then we will call this in.

## 2012-11-04 NOTE — Telephone Encounter (Signed)
He cannot take diclofenac as this is an anti-inflammatory medicine and it could make his kidney function worse Call in call colcrys 0.6 mg, take 2 initially then 1 one hour later, and one daily after that Also refill allopurinol 100mg 

## 2012-11-04 NOTE — Telephone Encounter (Signed)
Diclofenac  allopurinol Colchicine  He has had all of these in the past

## 2012-11-06 ENCOUNTER — Other Ambulatory Visit: Payer: Self-pay | Admitting: Physician Assistant

## 2012-11-06 ENCOUNTER — Ambulatory Visit (INDEPENDENT_AMBULATORY_CARE_PROVIDER_SITE_OTHER): Payer: MEDICARE | Admitting: Physician Assistant

## 2012-11-06 ENCOUNTER — Ambulatory Visit (INDEPENDENT_AMBULATORY_CARE_PROVIDER_SITE_OTHER): Payer: MEDICARE

## 2012-11-06 ENCOUNTER — Encounter: Payer: Self-pay | Admitting: *Deleted

## 2012-11-06 ENCOUNTER — Ambulatory Visit (INDEPENDENT_AMBULATORY_CARE_PROVIDER_SITE_OTHER): Payer: MEDICARE | Admitting: Family Medicine

## 2012-11-06 ENCOUNTER — Telehealth: Payer: Self-pay | Admitting: *Deleted

## 2012-11-06 ENCOUNTER — Encounter: Payer: Self-pay | Admitting: Family Medicine

## 2012-11-06 ENCOUNTER — Telehealth: Payer: Self-pay | Admitting: Physician Assistant

## 2012-11-06 ENCOUNTER — Encounter: Payer: Self-pay | Admitting: Physician Assistant

## 2012-11-06 VITALS — BP 136/72 | HR 67 | Ht 70.0 in | Wt 178.0 lb

## 2012-11-06 VITALS — BP 144/56 | HR 56 | Temp 97.8°F | Ht 69.5 in | Wt 177.6 lb

## 2012-11-06 DIAGNOSIS — E559 Vitamin D deficiency, unspecified: Secondary | ICD-10-CM

## 2012-11-06 DIAGNOSIS — E785 Hyperlipidemia, unspecified: Secondary | ICD-10-CM

## 2012-11-06 DIAGNOSIS — R5381 Other malaise: Secondary | ICD-10-CM

## 2012-11-06 DIAGNOSIS — D469 Myelodysplastic syndrome, unspecified: Secondary | ICD-10-CM

## 2012-11-06 DIAGNOSIS — E538 Deficiency of other specified B group vitamins: Secondary | ICD-10-CM | POA: Diagnosis not present

## 2012-11-06 DIAGNOSIS — R079 Chest pain, unspecified: Secondary | ICD-10-CM

## 2012-11-06 DIAGNOSIS — R0602 Shortness of breath: Secondary | ICD-10-CM | POA: Diagnosis not present

## 2012-11-06 DIAGNOSIS — R0789 Other chest pain: Secondary | ICD-10-CM

## 2012-11-06 DIAGNOSIS — M109 Gout, unspecified: Secondary | ICD-10-CM | POA: Diagnosis not present

## 2012-11-06 DIAGNOSIS — I200 Unstable angina: Secondary | ICD-10-CM

## 2012-11-06 DIAGNOSIS — I10 Essential (primary) hypertension: Secondary | ICD-10-CM

## 2012-11-06 DIAGNOSIS — R5383 Other fatigue: Secondary | ICD-10-CM

## 2012-11-06 LAB — POCT CBC
Granulocyte percent: 75.9 %G (ref 37–80)
HCT, POC: 40.1 % — AB (ref 43.5–53.7)
Hemoglobin: 13.3 g/dL — AB (ref 14.1–18.1)
MCHC: 33.2 g/dL (ref 31.8–35.4)
MPV: 8.1 fL (ref 0–99.8)
POC Granulocyte: 6.8 (ref 2–6.9)
POC LYMPH PERCENT: 20.9 %L (ref 10–50)
RBC: 4 M/uL — AB (ref 4.69–6.13)

## 2012-11-06 LAB — BASIC METABOLIC PANEL WITH GFR
BUN: 22 mg/dL (ref 6–23)
CO2: 26 mEq/L (ref 19–32)
Calcium: 9.5 mg/dL (ref 8.4–10.5)
Chloride: 104 mEq/L (ref 96–112)
Creat: 1.53 mg/dL — ABNORMAL HIGH (ref 0.50–1.35)

## 2012-11-06 LAB — HEPATIC FUNCTION PANEL
Albumin: 4.6 g/dL (ref 3.5–5.2)
Alkaline Phosphatase: 78 U/L (ref 39–117)
Bilirubin, Direct: 0.1 mg/dL (ref 0.0–0.3)
Indirect Bilirubin: 0.6 mg/dL (ref 0.0–0.9)
Total Bilirubin: 0.7 mg/dL (ref 0.3–1.2)

## 2012-11-06 MED ORDER — NITROGLYCERIN 0.4 MG SL SUBL: 0.4000 mg | SUBLINGUAL_TABLET | SUBLINGUAL | Status: DC | PRN

## 2012-11-06 MED ORDER — ASPIRIN EC 81 MG PO TBEC: 81.0000 mg | DELAYED_RELEASE_TABLET | Freq: Every day | ORAL | Status: DC

## 2012-11-06 MED ORDER — METOPROLOL SUCCINATE ER 25 MG PO TB24: 25.0000 mg | ORAL_TABLET | Freq: Every day | ORAL | Status: DC

## 2012-11-06 NOTE — Assessment & Plan Note (Signed)
Patient presents with new onset, progressive exertional SOB with associated CP, worrisome for accelerating angina pectoris. He has no known CAD, had a negative GXT approximately 10 years ago, and has CRFs notable only for HTN and age. We discussed the option of either a repeat stress test, with nuclear imaging, versus a cardiac catheterization, and the patient has elected to proceed with the latter. The risks/benefits of the procedure were reviewed, and plan was approved by Dr. Diona Browner. Patient will be started on low-dose ASA, Toprol 25 daily, and prn NTG.

## 2012-11-06 NOTE — Patient Instructions (Signed)
Your physician has requested that you have a cardiac catheterization. Cardiac catheterization is used to diagnose and/or treat various heart conditions. Doctors may recommend this procedure for a number of different reasons. The most common reason is to evaluate chest pain. Chest pain can be a symptom of coronary artery disease (CAD), and cardiac catheterization can show whether plaque is narrowing or blocking your heart's arteries. This procedure is also used to evaluate the valves, as well as measure the blood flow and oxygen levels in different parts of your heart. For further information please visit [email or web address] Please follow instruction sheet, as given. Begin Aspirin 81mg  daily Begin Nitrrolgycerin as needed for severe chest pain only - new sent to [facility] Begin Toprol XL 25mg  daily - new sent to pharm Continue all other medications.   Follow up in will be given after procedure

## 2012-11-06 NOTE — Telephone Encounter (Signed)
:   Left heart cath - main lab - tomorrow, 8/22 - 9:00 - [place]

## 2012-11-06 NOTE — Patient Instructions (Addendum)
Fall precautions discussed Continue current meds and therapeutic lifestyle changes Return to clinic in September or October for a flu shot Schedule follow up with Dr Mena Goes We will arrange for you to see someone in  cardiology today

## 2012-11-06 NOTE — Telephone Encounter (Signed)
EVERY THREE TO SIX MONTHS DR.MOORE'S OFFICE DRAWS A CBC, BMET, LFT'S, LIPO PROFILE, AND VITAMIN D. VERBAL ORDER AND READ BACK TO DR.MOHAMED- DR.MOORE'S OFFICE TO DRAW THE CBC AND CALL THIS OFFICE IF NEEDED. NOTIFIED TAYLOR. SHE VOICES UNDERSTANDING.

## 2012-11-06 NOTE — Progress Notes (Signed)
Primary Cardiologist: Prentice Docker, MD   HPI: Patient presents as an add-on today, per Dr. Jenene Slicker request, for evaluation of DOE and CP. He presents with no known history of CAD.   Patient presents with new onset, progressive exertional dyspnea with associated chest tightness over the last 3-4 weeks. His symptoms are promptly relieved within 5 minutes, with rest. He denies any rest angina. His CRFs are notable only for HTN and age.  He reports having had a negative routine GXT approximately 10 years ago in [place], as a screening test.   Twelve-lead EKG today, reviewed by me, indicates NSR 61 bpm; LAD/LAHB; slight J-point elevation in leads V2-V4; less than 1 mm horizontal ST segment depression in V6.  No Known Allergies  Current Outpatient Prescriptions  Medication Sig Dispense Refill  . allopurinol (ZYLOPRIM) 100 MG tablet Take 1 tablet (100 mg total) by mouth daily.  30 tablet  6  . cholecalciferol (VITAMIN D) 1000 UNITS tablet Take 2,000 Units by mouth daily.      . colchicine 0.6 MG tablet Take 1 tablet (0.6 mg total) by mouth daily.  17 tablet  2  . DULoxetine (CYMBALTA) 30 MG capsule Take 30 mg by mouth 3 (three) times a week.      . olmesartan-hydrochlorothiazide (BENICAR HCT) 20-12.5 MG per tablet Take 1 tablet by mouth daily.         Current Facility-Administered Medications  Medication Dose Route Frequency Provider Last Rate Last Dose  . cyanocobalamin ((VITAMIN B-12)) injection 1,000 mcg  1,000 mcg Intramuscular Q30 days Ernestina Penna, MD   1,000 mcg at 10/21/12 4098    Past Medical History  Diagnosis Date  . Unspecified essential hypertension   . Electrocardiogram abnormal   . Renal insufficiency   . Sleep apnea     Questionable  . Carcinoma of prostate     Past Surgical History  Procedure Laterality Date  . Retropubic prostatectomy      Radical  . Lymph node dissection      Bilateral pelvic    History   Social History  . Marital Status: Married   Spouse Name: N/A    Number of Children: N/A  . Years of Education: N/A   Occupational History  . Retired    Social History Main Topics  . Smoking status: Never Smoker   . Smokeless tobacco: Not on file  . Alcohol Use: No  . Drug Use: Not on file  . Sexual Activity: Not on file   Other Topics Concern  . Not on file   Social History Narrative   Married with 3 children    Family History  Problem Relation Age of Onset  . Aneurysm Father     Cerebral  . Coronary artery disease Neg Hx     ROS: no nausea, vomiting; no fever, chills; no melena, hematochezia; no claudication  PHYSICAL EXAM: BP 136/72  Pulse 67  Ht 5\' 10"  (1.778 m)  Wt 178 lb (80.74 kg)  BMI 25.54 kg/m2  SpO2 98% GENERAL: 79 year-old male; NAD HEENT: NCAT, PERRLA, EOMI; sclera clear; no xanthelasma NECK: palpable bilateral carotid pulses, no bruits; no JVD; no TM LUNGS: CTA bilaterally CARDIAC: RRR (S1, S2); soft, grade 2-3/6 systolic ejection murmur at base; no rubs or gallops ABDOMEN: soft, non-tender; intact BS EXTREMETIES: intact femoral and distal pulses; no femoral bruits; trace-1+ ankle edema SKIN: warm/dry; no obvious rash/lesions MUSCULOSKELETAL: Moderate tenderness with palpation of dorsum of right foot NEURO: no focal deficit; NL affect  EKG: reviewed and available in Electronic Records   ASSESSMENT & PLAN:  Accelerating angina Patient presents with new onset, progressive exertional SOB with associated CP, worrisome for accelerating angina pectoris. He has no known CAD, had a negative GXT approximately 10 years ago, and has CRFs notable only for HTN and age. We discussed the option of either a repeat stress test, with nuclear imaging, versus a cardiac catheterization, and the patient has elected to proceed with the latter. The risks/benefits of the procedure were reviewed, and plan was approved by Dr. Diona Browner. Patient will be started on low-dose ASA, Toprol 25 daily, and prn NTG.    Gene  Tanesha Arambula, PAC

## 2012-11-06 NOTE — Progress Notes (Signed)
Subjective:    Patient ID: Jon Reid, male    DOB: 10/15/1928, 79 y.o.   MRN: [id number]  HPI Patient is here today for followup and management of multiple chronic medical problems. He has a history of hypertension, hyperlipidemia, both vitamin D and B12 deficiencies, and gout. He has seen a cardiologist in the past. He complains today of shortness of breath with exertion. He is currently being treated for a gallop layer and a prescription colcreys and allopurinol were called in recently. Over the past 30 days he has had increasing episodes of shortness of breath with exertion and left shoulder pain. When he is walking he has to stop and get his breath. The pain or shortness of breath, with walking a half a block or so.   Review of Systems  Constitutional: Positive for fatigue. Negative for activity change and appetite change.  HENT: Positive for congestion (slight) and postnasal drip (slight). Negative for ear pain, sore throat and voice change.   Eyes: Negative for pain, discharge, redness and itching.  Respiratory: Positive for chest tightness (with exertion) and shortness of breath (with exertion). Negative for cough and wheezing.   Cardiovascular: Positive for chest pain (with exertion) and leg swelling (bilateral lower legs).  Gastrointestinal: Negative.  Negative for nausea, vomiting, abdominal pain, diarrhea, constipation and blood in stool.  Endocrine: Negative.  Negative for cold intolerance, heat intolerance, polydipsia, polyphagia and polyuria.  Genitourinary: Negative for dysuria, frequency and hematuria.  Musculoskeletal: Positive for joint swelling (bilateral ankles, R>L) and arthralgias (bilateral ankles, gout flare). Negative for myalgias and back pain.  Skin: Negative.  Negative for color change, pallor, rash and wound.  Allergic/Immunologic: Negative.  Negative for environmental allergies.  Neurological: Positive for weakness (with exertion). Negative for dizziness,  tremors, light-headedness, numbness and headaches.  Psychiatric/Behavioral: Positive for decreased concentration (slight memory deficit). Negative for confusion, sleep disturbance and agitation. The patient is not nervous/anxious.        Objective:   Physical Exam BP 144/56  Pulse 56  Temp(Src) 97.8 F (36.6 C) (Oral)  Ht 5' 9.5" (1.765 m)  Wt 177 lb 9.6 oz (80.559 kg)  BMI 25.86 kg/m2  The patient appeared well nourished and normally developed, alert and oriented to time and place. Speech, behavior and judgement appear normal. Vital signs as documented.  Head exam is unremarkable. No scleral icterus or pallor noted. Ears nose and throat were within normal limits.  Neck is without jugular venous distension, thyromegally, or carotid bruits. Carotid upstrokes are brisk bilaterally. No cervical adenopathy. Lungs are clear anteriorly and posteriorly to auscultation. Normal respiratory effort. Cardiac exam reveals regular rate and rhythm at 72 per minute. First and second heart sounds normal.  No murmurs, rubs or gallops.  Abdominal exam reveals normal bowl sounds, no masses, no organomegaly and no aortic enlargement. No inguinal adenopathy. There is no abdominal tenderness. The right lower extremity is edematous with rubor and erythema secondary to the recent gout attack.  Both femoral and pedal pulses are normal. Skin without pallor or jaundice.  Warm and dry, without rash. Neurologic exam reveals normal deep tendon reflexes and normal sensation.  EKG: Left anterior fascicular block, no ST segment changes [facility] reading (PRIMARY) by  Dr. Christell Constant: No change on chest x-ray, still has prominent hiatal hernia                                 Because of the patient's  symptoms of shortness of breath with exertion and left shoulder pain, I spoke with the cardiologist and he is arranging for patient to be evaluated by cardiology today or tomorrow. Patient and son are aware of this, as they will be  notified by the cardiology office through their place of business.           Assessment & Plan:  Hypertension - Plan: Ambulatory referral to Cardiology, EKG 12-Lead  Hyperlipemia  Vitamin B 12 deficiency  Gout  Myelodysplastic syndrome  Fatigue  Vitamin D deficiency  Chest pain - Plan: Ambulatory referral to Cardiology, EKG 12-Lead  SOB (shortness of breath) - Plan: DG Chest 2 View  Chest tightness  Patient Instructions  Fall precautions discussed Continue current meds and therapeutic lifestyle changes Return to clinic in September or October for a flu shot Schedule follow up with Dr Mena Goes We will arrange for you to see someone in  cardiology today   Nyra Capes MD

## 2012-11-07 ENCOUNTER — Other Ambulatory Visit: Payer: Self-pay | Admitting: *Deleted

## 2012-11-07 ENCOUNTER — Inpatient Hospital Stay (HOSPITAL_COMMUNITY)
Admission: RE | Admit: 2012-11-07 | Discharge: 2012-11-17 | DRG: 234 | Disposition: A | Discharge status: Home / Self Care | Payer: MEDICARE | Source: Ambulatory Visit | Attending: Cardiothoracic Surgery | Admitting: Cardiothoracic Surgery

## 2012-11-07 ENCOUNTER — Inpatient Hospital Stay (HOSPITAL_COMMUNITY): Payer: MEDICARE

## 2012-11-07 ENCOUNTER — Encounter: Payer: MEDICARE | Admitting: Cardiothoracic Surgery

## 2012-11-07 ENCOUNTER — Encounter (HOSPITAL_COMMUNITY)
Admission: RE | Disposition: A | Discharge status: Home / Self Care | Payer: Self-pay | Source: Ambulatory Visit | Attending: Cardiothoracic Surgery

## 2012-11-07 ENCOUNTER — Encounter (HOSPITAL_COMMUNITY): Payer: MEDICARE

## 2012-11-07 ENCOUNTER — Ambulatory Visit (HOSPITAL_COMMUNITY): Payer: MEDICARE

## 2012-11-07 ENCOUNTER — Encounter (HOSPITAL_COMMUNITY): Payer: Self-pay | Admitting: General Practice

## 2012-11-07 DIAGNOSIS — I459 Conduction disorder, unspecified: Secondary | ICD-10-CM | POA: Diagnosis present

## 2012-11-07 DIAGNOSIS — R404 Transient alteration of awareness: Secondary | ICD-10-CM | POA: Diagnosis not present

## 2012-11-07 DIAGNOSIS — I200 Unstable angina: Secondary | ICD-10-CM | POA: Diagnosis not present

## 2012-11-07 DIAGNOSIS — I10 Essential (primary) hypertension: Secondary | ICD-10-CM | POA: Diagnosis not present

## 2012-11-07 DIAGNOSIS — I2510 Atherosclerotic heart disease of native coronary artery without angina pectoris: Secondary | ICD-10-CM | POA: Diagnosis not present

## 2012-11-07 DIAGNOSIS — E785 Hyperlipidemia, unspecified: Secondary | ICD-10-CM | POA: Diagnosis not present

## 2012-11-07 DIAGNOSIS — I129 Hypertensive chronic kidney disease with stage 1 through stage 4 chronic kidney disease, or unspecified chronic kidney disease: Secondary | ICD-10-CM | POA: Diagnosis present

## 2012-11-07 DIAGNOSIS — K5900 Constipation, unspecified: Secondary | ICD-10-CM | POA: Diagnosis present

## 2012-11-07 DIAGNOSIS — R079 Chest pain, unspecified: Secondary | ICD-10-CM

## 2012-11-07 DIAGNOSIS — Z01810 Encounter for preprocedural cardiovascular examination: Secondary | ICD-10-CM | POA: Diagnosis not present

## 2012-11-07 DIAGNOSIS — K219 Gastro-esophageal reflux disease without esophagitis: Secondary | ICD-10-CM | POA: Diagnosis present

## 2012-11-07 DIAGNOSIS — I369 Nonrheumatic tricuspid valve disorder, unspecified: Secondary | ICD-10-CM | POA: Diagnosis not present

## 2012-11-07 DIAGNOSIS — D62 Acute posthemorrhagic anemia: Secondary | ICD-10-CM | POA: Diagnosis not present

## 2012-11-07 DIAGNOSIS — J90 Pleural effusion, not elsewhere classified: Secondary | ICD-10-CM | POA: Diagnosis not present

## 2012-11-07 DIAGNOSIS — Y849 Medical procedure, unspecified as the cause of abnormal reaction of the patient, or of later complication, without mention of misadventure at the time of the procedure: Secondary | ICD-10-CM | POA: Clinically undetermined

## 2012-11-07 DIAGNOSIS — J988 Other specified respiratory disorders: Secondary | ICD-10-CM | POA: Diagnosis not present

## 2012-11-07 DIAGNOSIS — I498 Other specified cardiac arrhythmias: Secondary | ICD-10-CM | POA: Diagnosis not present

## 2012-11-07 DIAGNOSIS — I959 Hypotension, unspecified: Secondary | ICD-10-CM | POA: Diagnosis present

## 2012-11-07 DIAGNOSIS — R0602 Shortness of breath: Secondary | ICD-10-CM | POA: Diagnosis not present

## 2012-11-07 DIAGNOSIS — I209 Angina pectoris, unspecified: Secondary | ICD-10-CM | POA: Diagnosis not present

## 2012-11-07 DIAGNOSIS — N189 Chronic kidney disease, unspecified: Secondary | ICD-10-CM | POA: Diagnosis present

## 2012-11-07 DIAGNOSIS — J9819 Other pulmonary collapse: Secondary | ICD-10-CM | POA: Diagnosis not present

## 2012-11-07 HISTORY — DX: Gastro-esophageal reflux disease without esophagitis: K21.9

## 2012-11-07 HISTORY — DX: Gout, unspecified: M10.9

## 2012-11-07 HISTORY — PX: LEFT HEART CATHETERIZATION WITH CORONARY ANGIOGRAM: SHX5451

## 2012-11-07 HISTORY — PX: CARDIAC CATHETERIZATION: SHX172

## 2012-11-07 LAB — CBC
HCT: 38.3 % — ABNORMAL LOW (ref 39.0–52.0)
Hemoglobin: 13.3 g/dL (ref 13.0–17.0)
MCV: 100 fL (ref 78.0–100.0)
RBC: 3.83 MIL/uL — ABNORMAL LOW (ref 4.22–5.81)
WBC: 7.3 10*3/uL (ref 4.0–10.5)

## 2012-11-07 LAB — BLOOD GAS, ARTERIAL
Acid-base deficit: 2.2 mmol/L — ABNORMAL HIGH (ref 0.0–2.0)
Bicarbonate: 21.7 mEq/L (ref 20.0–24.0)
Drawn by: 310571
FIO2: 0.21 %
O2 Saturation: 96.4 %
Patient temperature: 98.6
TCO2: 22.8 mmol/L (ref 0–100)
pCO2 arterial: 34.8 mmHg — ABNORMAL LOW (ref 35.0–45.0)
pH, Arterial: 7.412 (ref 7.350–7.450)
pO2, Arterial: 85.2 mmHg (ref 80.0–100.0)

## 2012-11-07 LAB — NMR LIPOPROFILE WITH LIPIDS
Cholesterol, Total: 173 mg/dL (ref <200)
HDL Particle Number: 36.6 umol/L (ref >=30.5)
LDL (calc): 92 mg/dL (ref <100)
LDL Particle Number: 772 nmol/L (ref <1000)
LDL Size: 21 nm (ref >20.5)
Large VLDL-P: 1.4 nmol/L (ref <=2.7)
Small LDL Particle Number: <90 nmol/L (ref <=527)

## 2012-11-07 LAB — BASIC METABOLIC PANEL
CO2: 23 mEq/L (ref 19–32)
Chloride: 106 mEq/L (ref 96–112)
GFR calc Af Amer: 49 mL/min — ABNORMAL LOW (ref >90)
Potassium: 3.9 mEq/L (ref 3.5–5.1)

## 2012-11-07 LAB — APTT: aPTT: 38 seconds — ABNORMAL HIGH (ref 24–37)

## 2012-11-07 SURGERY — LEFT HEART CATHETERIZATION WITH CORONARY ANGIOGRAM
Anesthesia: LOCAL

## 2012-11-07 MED ORDER — SODIUM CHLORIDE 0.9 % IV SOLN: 250.0000 mL | INTRAVENOUS | Status: DC | PRN

## 2012-11-07 MED ORDER — ASPIRIN 81 MG PO CHEW: 324.0000 mg | CHEWABLE_TABLET | ORAL | Status: DC

## 2012-11-07 MED ORDER — ONDANSETRON HCL 4 MG/2ML IJ SOLN: 4.0000 mg | Freq: Four times a day (QID) | INTRAMUSCULAR | Status: DC | PRN

## 2012-11-07 MED ORDER — SODIUM CHLORIDE 0.9 % IJ SOLN: 3.0000 mL | Freq: Two times a day (BID) | INTRAMUSCULAR | Status: DC

## 2012-11-07 MED ORDER — COLCHICINE 0.6 MG PO TABS
0.6000 mg | ORAL_TABLET | Freq: Every day | ORAL | Status: DC
  Administered 2012-11-07 – 2012-11-10 (×4): 0.6 mg via ORAL
  Filled 2012-11-07 (×5): qty 1

## 2012-11-07 MED ORDER — LIDOCAINE HCL (PF) 1 % IJ SOLN
INTRAMUSCULAR | Status: AC
  Filled 2012-11-07: qty 30

## 2012-11-07 MED ORDER — ATORVASTATIN CALCIUM 80 MG PO TABS
80.0000 mg | ORAL_TABLET | Freq: Every day | ORAL | Status: DC
  Administered 2012-11-07 – 2012-11-16 (×9): 80 mg via ORAL
  Filled 2012-11-07 (×11): qty 1

## 2012-11-07 MED ORDER — SODIUM CHLORIDE 0.9 % IV SOLN: INTRAVENOUS | Status: DC

## 2012-11-07 MED ORDER — NITROGLYCERIN 0.4 MG SL SUBL: 0.4000 mg | SUBLINGUAL_TABLET | SUBLINGUAL | Status: DC | PRN

## 2012-11-07 MED ORDER — DULOXETINE HCL 30 MG PO CPEP
30.0000 mg | ORAL_CAPSULE | ORAL | Status: DC
  Administered 2012-11-07 – 2012-11-17 (×5): 30 mg via ORAL
  Filled 2012-11-07 (×9): qty 1

## 2012-11-07 MED ORDER — ALLOPURINOL 100 MG PO TABS
100.0000 mg | ORAL_TABLET | Freq: Every day | ORAL | Status: DC
  Administered 2012-11-07 – 2012-11-10 (×4): 100 mg via ORAL
  Filled 2012-11-07 (×5): qty 1

## 2012-11-07 MED ORDER — ASPIRIN 81 MG PO CHEW
CHEWABLE_TABLET | ORAL | Status: AC
  Filled 2012-11-07: qty 4

## 2012-11-07 MED ORDER — VITAMIN D3 25 MCG (1000 UNIT) PO TABS
2000.0000 [IU] | ORAL_TABLET | Freq: Every day | ORAL | Status: DC
  Administered 2012-11-07 – 2012-11-17 (×10): 2000 [IU] via ORAL
  Filled 2012-11-07 (×11): qty 2

## 2012-11-07 MED ORDER — SODIUM CHLORIDE 0.9 % IV SOLN
INTRAVENOUS | Status: DC
  Administered 2012-11-11: 07:00:00 via INTRAVENOUS

## 2012-11-07 MED ORDER — SODIUM CHLORIDE 0.9 % IJ SOLN: 3.0000 mL | INTRAMUSCULAR | Status: DC | PRN

## 2012-11-07 MED ORDER — SODIUM CHLORIDE 0.9 % IV SOLN
INTRAVENOUS | Status: DC
  Administered 2012-11-07: 07:00:00 via INTRAVENOUS

## 2012-11-07 MED ORDER — HEPARIN (PORCINE) IN NACL 100-0.45 UNIT/ML-% IJ SOLN
1050.0000 [IU]/h | INTRAMUSCULAR | Status: DC
  Administered 2012-11-07: 950 [IU]/h via INTRAVENOUS
  Administered 2012-11-08 – 2012-11-10 (×4): 1050 [IU]/h via INTRAVENOUS
  Filled 2012-11-07 (×5): qty 250

## 2012-11-07 MED ORDER — HEPARIN SODIUM (PORCINE) 1000 UNIT/ML IJ SOLN
INTRAMUSCULAR | Status: AC
  Filled 2012-11-07: qty 1

## 2012-11-07 MED ORDER — MIDAZOLAM HCL 2 MG/2ML IJ SOLN
INTRAMUSCULAR | Status: AC
  Filled 2012-11-07: qty 2

## 2012-11-07 MED ORDER — HYDROCHLOROTHIAZIDE 12.5 MG PO CAPS
12.5000 mg | ORAL_CAPSULE | Freq: Every day | ORAL | Status: DC
  Administered 2012-11-07 – 2012-11-10 (×4): 12.5 mg via ORAL
  Filled 2012-11-07 (×5): qty 1

## 2012-11-07 MED ORDER — NITROGLYCERIN 0.2 MG/ML ON CALL CATH LAB
INTRAVENOUS | Status: AC
  Filled 2012-11-07: qty 1

## 2012-11-07 MED ORDER — ASPIRIN 81 MG PO CHEW
324.0000 mg | CHEWABLE_TABLET | ORAL | Status: AC
  Administered 2012-11-07: 324 mg via ORAL

## 2012-11-07 MED ORDER — ACETAMINOPHEN 325 MG PO TABS: 650.0000 mg | ORAL_TABLET | ORAL | Status: DC | PRN

## 2012-11-07 MED ORDER — OLMESARTAN MEDOXOMIL-HCTZ 40-12.5 MG PO TABS: 1.0000 | ORAL_TABLET | Freq: Every day | ORAL | Status: DC

## 2012-11-07 MED ORDER — FENTANYL CITRATE 0.05 MG/ML IJ SOLN
INTRAMUSCULAR | Status: AC
  Filled 2012-11-07: qty 2

## 2012-11-07 MED ORDER — METOPROLOL SUCCINATE ER 25 MG PO TB24
25.0000 mg | ORAL_TABLET | Freq: Every day | ORAL | Status: DC
  Administered 2012-11-07 – 2012-11-10 (×3): 25 mg via ORAL
  Filled 2012-11-07 (×5): qty 1

## 2012-11-07 MED ORDER — IRBESARTAN 300 MG PO TABS
300.0000 mg | ORAL_TABLET | Freq: Every day | ORAL | Status: DC
  Administered 2012-11-07 – 2012-11-10 (×4): 300 mg via ORAL
  Filled 2012-11-07 (×5): qty 1

## 2012-11-07 MED ORDER — VERAPAMIL HCL 2.5 MG/ML IV SOLN
INTRAVENOUS | Status: AC
  Filled 2012-11-07: qty 2

## 2012-11-07 MED ORDER — HEPARIN (PORCINE) IN NACL 2-0.9 UNIT/ML-% IJ SOLN
INTRAMUSCULAR | Status: AC
  Filled 2012-11-07: qty 1000

## 2012-11-07 NOTE — CV Procedure (Signed)
    Cardiac Cath Note  Jon Reid [id number] 1928-06-04  Procedure: Left  Heart Cardiac Catheterization Note Indications: unstable angina  Procedure Details Consent: Obtained Time Out: Verified patient identification, verified procedure, site/side was marked, verified correct patient position, special equipment/implants available, Radiology Safety Procedures followed,  medications/allergies/relevent history reviewed, required imaging and test results available.  Performed   Medications: Fentanyl: 25 mcg IV Versed: 1 mg IV Verapamil 3 mg IA Heparin 4000 units IV  The right radial artery was easily canulated using a modified Seldinger technique.  The catheters were exchanged over a guidewire.   Hemodynamics:   LV pressure: 141/4 Aortic pressure: 145/61   Angiography   Left Main: moderate - heavy calcification.  Distal 20-30% stenosis  Left anterior Descending: + calcified, proximal 80% stenosis, mid sequential 90% stenosis, distal 80-90% stenosis.    Left Circumflex:  prox mild irreg.  Mid long 90-95% stenosis prior to posterior lateral artery.  OM1 has 90% stenosis  Right Coronary Artery: large, dominant. Proximal 50% stenosis, mid 20%, distal 50%.  The PDA has a 90 % stenosis at the midpoint.  PLSA has minor luminal irreg.  LV Gram: normal LV function.  Complications: No apparent complications Patient did tolerate procedure well.  Contrast used: 50 cc  Conclusions:   1. Severe 3 V CAD.   2. Normal LV function.  His coronary anatomy is not amenable to PCI and his symptoms are accelerating.  Will refer to TCTS for consideration of CABG.      Jon Reid, Jon Reid., MD, [facility] 11/07/2012, 10:41 AM Office - [phone] Pager [phone]

## 2012-11-07 NOTE — H&P (Signed)
Primary Cardiologist: Prentice Docker, MD   HPI: Patient presents as an add-on today, per Dr. Jenene Slicker request, for evaluation of DOE and CP. He presents with no known history of CAD.    Patient presents with new onset, progressive exertional dyspnea with associated chest tightness over the last 3-4 weeks. His symptoms are promptly relieved within 5 minutes, with rest. He denies any rest angina. His CRFs are notable only for HTN and age.   He reports having had a negative routine GXT approximately 10 years ago in [place], as a screening test.   Twelve-lead EKG today, reviewed by me, indicates NSR 61 bpm; LAD/LAHB; slight J-point elevation in leads V2-V4; less than 1 mm horizontal ST segment depression in V6.   No Known Allergies    Current Outpatient Prescriptions   Medication  Sig  Dispense  Refill   .  allopurinol (ZYLOPRIM) 100 MG tablet  Take 1 tablet (100 mg total) by mouth daily.   30 tablet   6   .  cholecalciferol (VITAMIN D) 1000 UNITS tablet  Take 2,000 Units by mouth daily.         .  colchicine 0.6 MG tablet  Take 1 tablet (0.6 mg total) by mouth daily.   17 tablet   2   .  DULoxetine (CYMBALTA) 30 MG capsule  Take 30 mg by mouth 3 (three) times a week.         .  olmesartan-hydrochlorothiazide (BENICAR HCT) 20-12.5 MG per tablet  Take 1 tablet by mouth daily.             Current Facility-Administered Medications   Medication  Dose  Route  Frequency  Provider  Last Rate  Last Dose   .  cyanocobalamin ((VITAMIN B-12)) injection 1,000 mcg   1,000 mcg  Intramuscular  Q30 days  Ernestina Penna, MD     1,000 mcg at 10/21/12 4098       Past Medical History   Diagnosis  Date   .  Unspecified essential hypertension     .  Electrocardiogram abnormal     .  Renal insufficiency     .  Sleep apnea         Questionable   .  Carcinoma of prostate         Past Surgical History   Procedure  Laterality  Date   .  Retropubic prostatectomy           Radical   .  Lymph node  dissection           Bilateral pelvic       History       Social History   .  Marital Status:  Married       Spouse Name:  N/A       Number of Children:  N/A   .  Years of Education:  N/A       Occupational History   .  Retired         Social History Main Topics   .  Smoking status:  Never Smoker    .  Smokeless tobacco:  Not on file   .  Alcohol Use:  No   .  Drug Use:  Not on file   .  Sexual Activity:  Not on file       Other Topics  Concern   .  Not on file       Social History Narrative  Married with 3 children      Family History   Problem  Relation  Age of Onset   .  Aneurysm  Father         Cerebral   .  Coronary artery disease  Neg Hx        ROS: no nausea, vomiting; no fever, chills; no melena, hematochezia; no claudication   PHYSICAL EXAM: BP 136/72  Pulse 67  Ht 5\' 10"  (1.778 m)  Wt 178 lb (80.74 kg)  BMI 25.54 kg/m2  SpO2 98% GENERAL: 79 year-old male; NAD HEENT: NCAT, PERRLA, EOMI; sclera clear; no xanthelasma NECK: palpable bilateral carotid pulses, no bruits; no JVD; no TM LUNGS: CTA bilaterally CARDIAC: RRR (S1, S2); soft, grade 2-3/6 systolic ejection murmur at base; no rubs or gallops ABDOMEN: soft, non-tender; intact BS EXTREMETIES: intact femoral and distal pulses; no femoral bruits; trace-1+ ankle edema SKIN: warm/dry; no obvious rash/lesions MUSCULOSKELETAL: Moderate tenderness with palpation of dorsum of right foot NEURO: no focal deficit; NL affect   EKG: reviewed and available in Electronic Records  ASSESSMENT & PLAN:  Accelerating angina Patient presents with new onset, progressive exertional SOB with associated CP, worrisome for accelerating angina pectoris. He has no known CAD, had a negative GXT approximately 10 years ago, and has CRFs notable only for HTN and age. We discussed the option of either a repeat stress test, with nuclear imaging, versus a cardiac catheterization, and the patient has elected to proceed  with the latter. The risks/benefits of the procedure were reviewed, and plan was approved by Dr. Diona Browner. Patient will be started on low-dose ASA, Toprol 25 daily, and prn NTG.   Gene Serpe, PAC  Attending Note:   The patient was seen and examined.  Agree with assessment and plan as noted above.  Changes made to the above note as needed.  Pt was referred to [facility] for cath for symptoms of unstable angina.  He minimizes symptoms according to his wife. He has been having progressive angina with any and all exertion.  He was found to have severe 3V CAD.  I'm concerned about the acceleration of his symptoms and given his anatomy, I am sure that he is having unstable angina  Symptoms.    Will admit him, start heparin, continue BB and aspirin.  Will have TCTS consult on him.  I anticipate surgery next week.  [name], [name], MD, [facility] 11/07/2012, 11:16 AM Office - [phone] Pager [phone]

## 2012-11-07 NOTE — Progress Notes (Signed)
ANTICOAGULATION CONSULT NOTE - Initial Consult  Pharmacy Consult for Heparin Indication: 3 vessel CAD considering CABG  No Known Allergies Patient Measurements: Height: 6' (182.9 cm) Weight: 175 lb (79.379 kg) IBW/kg (Calculated) : 77.6 Heparin Dosing Weight: 79 kg Vital Signs: Temp: 97.1 F (36.2 C) (08/22 0721) Temp src: Oral (08/22 0721) BP: 162/72 mmHg (08/22 0721) Pulse Rate: 61 (08/22 0955) Labs:  Recent Labs  11/06/12 1229 11/07/12 0736  HGB 13.3* 13.3  HCT 40.1* 38.3*  PLT  --  253  APTT  --  38*  LABPROT  --  13.2  INR  --  1.02  CREATININE  --  1.48*   Estimated Creatinine Clearance: 42.2 ml/min (by C-G formula based on Cr of 1.48).  Medical History: Past Medical History  Diagnosis Date  . Unspecified essential hypertension   . Electrocardiogram abnormal   . Renal insufficiency   . Sleep apnea     Questionable  . Carcinoma of prostate    Assessment: 24 YOM s/p cardiac cath found to have severe 3 vessel CAD to start IV heparin while awaiting decision on CABG. Sheath was removed at 10:35AM and TR band applied. No bleeding or hematoma documented at the site. TR band completely deflated at 12PM. No bleeding or hematoma after deflation.   Goal of Therapy:  Heparin level 0.3-0.7 units/ml Monitor platelets by anticoagulation protocol: Yes   Plan:  1. Start heparin at rate of 950 units/hr (no bolus due to recent procedure) at 2000PM.  2. Heparin level 8 hours after start- ok to do with AM labs.  3. Daily heparin level and CBC while on therapy. 4. Monitor for signs and symptoms of bleeding.   Link Snuffer, PharmD, BCPS Clinical Pharmacist [phone] 11/07/2012,12:51 PM

## 2012-11-07 NOTE — Progress Notes (Signed)
Utilization review completed.  

## 2012-11-07 NOTE — Consult Note (Signed)
[address]       [place] [address]             [phone]        Jon Reid [facility] Medical Record #[id number] Date of Birth: [birth year]  Referring: No ref. provider found Primary Care: Rudi Heap, MD  Chief Complaint:  Dyspnea on exertion and chest tightness  History of Present Illness:     Patient examined and cardiac catheterization reviewed. 79 year old Caucasian hypertensive nonsmoker in excellent health with recent onset of exertional shortness of breath and chest discomfort relieved by rest. Frequency and intensity of symptoms have significantly increased such that he is no longer able to do his usual evening walk or exercise routine. Cardiac enzymes are negative. EKG was nonspecific. Cardiac catheterization was recommended which shows severe three-vessel CAD 80-90% LAD, 95% circumflex, 90% stenosis of the PDA, 90% stenosis of OM1 LV EF normal, LVEDP 14 mm mercury  Patient was placed on heparin protocol and is stable in the hospital in sinus rhythm. Chest x-ray is been clear. PFTs and carotid Dopplers are pending.  Patient has no previous history of heart disease arrhythmia or CHF. His risk factors include advanced age and hypertension.  Surgical coronary pressurization was recommended for his severe three-vessel CAD and unstable angina.   Current Activity/ Functional Status: Excellent functional level, lives independently in [facility]   Zubrod Score: At the time of surgery this patient's most appropriate activity status/level should be described as: []  Normal activity, no symptoms [x]  Symptoms, fully ambulatory []  Symptoms, in bed less than or equal to 50% of the time []  Symptoms, in bed greater than 50% of the time but less than 100% []  Bedridden []  Moribund  Past Medical History  Diagnosis Date  . Unspecified essential hypertension   . Electrocardiogram abnormal   . Renal insufficiency   . Sleep apnea    Questionable  . Carcinoma of prostate   . Shortness of breath     with exercise "  . GERD (gastroesophageal reflux disease)   . Gout     Past Surgical History  Procedure Laterality Date  . Retropubic prostatectomy      Radical  . Lymph node dissection      Bilateral pelvic  . Cardiac catheterization  11/07/2012    Dr Elease Hashimoto    History  Smoking status  . Never Smoker   Smokeless tobacco  . Never Used    History  Alcohol Use No    History   Social History  . Marital Status: Married    Spouse Name: N/A    Number of Children: N/A  . Years of Education: N/A   Occupational History  . Retired    Social History Main Topics  . Smoking status: Never Smoker   . Smokeless tobacco: Never Used  . Alcohol Use: No  . Drug Use: No  . Sexual Activity: Not on file   Other Topics Concern  . Not on file   Social History Narrative   Married with 3 children    No Known Allergies  Current Facility-Administered Medications  Medication Dose Route Frequency Provider Last Rate Last Dose  . 0.9 %  sodium chloride infusion   Intravenous Continuous [name], MD 125 mL/hr at 11/07/12 1142 500 mL at 11/07/12 1142  . acetaminophen (TYLENOL) tablet 650 mg  650 mg Oral Q4H PRN [name], MD      . allopurinol (ZYLOPRIM) tablet  100 mg  100 mg Oral Daily [name], MD   100 mg at 11/07/12 1557  . aspirin 81 MG chewable tablet           . atorvastatin (LIPITOR) tablet 80 mg  80 mg Oral q1800 [name], MD   80 mg at 11/07/12 1557  . cholecalciferol (VITAMIN D) tablet 2,000 Units  2,000 Units Oral Daily [name], MD   2,000 Units at 11/07/12 1557  . colchicine tablet 0.6 mg  0.6 mg Oral Daily [name], MD   0.6 mg at 11/07/12 1557  . DULoxetine (CYMBALTA) DR capsule 30 mg  30 mg Oral 3 times weekly [name], MD   30 mg at 11/07/12 1557  . heparin ADULT infusion 100 units/mL (25000 units/250 mL)  950 Units/hr Intravenous Continuous Fayne Norrie, [facility]      . irbesartan (AVAPRO) tablet 300 mg  300 mg Oral Daily Dennie Fetters, RPH   300 mg at 11/07/12 1556   And  . hydrochlorothiazide (MICROZIDE) capsule 12.5 mg  12.5 mg Oral Daily Dennie Fetters, RPH   12.5 mg at 11/07/12 1557  . metoprolol succinate (TOPROL-XL) 24 hr tablet 25 mg  25 mg Oral Daily [name], MD   25 mg at 11/07/12 1557  . nitroGLYCERIN (NITROSTAT) SL tablet 0.4 mg  0.4 mg Sublingual Q5 min PRN [name], MD      . ondansetron [facility]) injection 4 mg  4 mg Intravenous Q6H PRN [name], MD        Facility-administered medications prior to admission  Medication Dose Route Frequency Provider Last Rate Last Dose  . cyanocobalamin ((VITAMIN B-12)) injection 1,000 mcg  1,000 mcg Intramuscular Q30 days Ernestina Penna, MD   1,000 mcg at 10/21/12 1610   Prescriptions prior to admission  Medication Sig Dispense Refill  . allopurinol (ZYLOPRIM) 100 MG tablet Take 1 tablet (100 mg total) by mouth daily.  30 tablet  6  . cholecalciferol (VITAMIN D) 1000 UNITS tablet Take 2,000 Units by mouth daily.      . colchicine 0.6 MG tablet Take 1 tablet (0.6 mg total) by mouth daily.  17 tablet  2  . cyanocobalamin (,VITAMIN B-12,) 1000 MCG/ML injection Inject 1,000 mcg into the muscle every 30 (thirty) days.      . DULoxetine (CYMBALTA) 30 MG capsule Take 30 mg by mouth 3 (three) times a week.      . metoprolol succinate (TOPROL-XL) 25 MG 24 hr tablet Take 1 tablet (25 mg total) by mouth daily.  30 tablet  6  . nitroGLYCERIN (NITROSTAT) 0.4 MG SL tablet Place 1 tablet (0.4 mg total) under the tongue every 5 (five) minutes as needed for chest pain.  25 tablet  3  . olmesartan-hydrochlorothiazide (BENICAR HCT) 40-12.5 MG per tablet Take 1 tablet by mouth daily.        Family History  Problem Relation Age of Onset  . Aneurysm Father     Cerebral  . Coronary artery disease Neg Hx      Review of Systems:    Chronic mild renal insufficiency with  baseline creatinine 1.6   Patient is left-hand dominant has never had a mini stroke   Patient status post retropubic radical prostatectomy 8 years ago without urinary symptoms    Patient has a blood dyscrasia followed by Dr. Arbutus Ped but has not had bone marrow biopsy-macrocytosis without anemia treated with monthly B12  injections    Patient denies diabetes claudication TIA or chest trauma     Cardiac Review of Systems: Y or N  Chest Pain [    Y.]  Resting SOB [  N. ] Exertional SOB  [Y.  ]  Orthopnea [N.  ]   Pedal Edema [ N.  ]    Palpitations [  N.] Syncope  [N.  ]   Presyncope [N.   ]  General Review of Systems: [Y] = yes [  ]=no Constitional: recent weight change [Y.-loss 4 pounds this summer  ]; anorexia [  ]; fatigue [  ]; nausea [  ]; night sweats [  ]; fever [  ]; or chills [  ]                                                               Dental: poor dentition[  ]; Last Dentist visit: Every 6 months   Eye : blurred vision [  ]; diplopia [   ]; vision changes [  ];  Amaurosis fugax[  ]; Resp: cough [  ];  wheezing[  ];  hemoptysis[  ]; shortness of breath[  ]; paroxysmal nocturnal dyspnea[  ]; dyspnea on exertion[  ]; or orthopnea[  ];  GI:  gallstones[  ], vomiting[  ];  dysphagia[Y.-history GERD  ]; melena[  ];  hematochezia [  ]; heartburn[Y.  ];   Hx of  Colonoscopy[  ]; GU: kidney stones [  ]; hematuria[  ];   dysuria [  ];  nocturia[  ];  history of     obstruction [  ]; urinary frequency [ Y. ]             Skin: rash, swelling[  ];, hair loss[  ];  peripheral edema[  ];  or itching[  ]; Musculosketetal: myalgias[  ];  joint swelling[  ];  joint erythema[  ];  joint pain[  ];  back pain[  ];  Heme/Lymph: bruising[  ];  bleeding[  ];  anemia[  ];  Neuro: TIA[  ];  headaches[  ];  stroke[  ];  vertigo[  ];  seizures[  ];   paresthesias[  ];  difficulty walking[  ];  Psych:depression[  ]; anxiety[  ];  Endocrine: diabetes[  ];  thyroid dysfunction[  ];  Immunizations: Flu [  ];  Pneumococcal[  ];  Other:  Physical Exam: BP 134/55  Pulse 61  Temp(Src) 97.1 F (36.2 C) (Oral)  Resp 18  Ht 6' (1.829 m)  Wt 175 lb (79.379 kg)  BMI 23.73 kg/m2  SpO2 100%  Gen.-Very pleasant 79 year old gentleman appears younger than stated age in no distress HEENT normocephalic, pupils equal dentition adequate Neck without JVD mass or bruit Cardiac regular rhythm grade 2/6 systolic murmur without gallop Lungs-clear to auscultation no deformity or tenderness Abdomen soft nontender no pulsatile mass Extremities no clubbing cyanosis edema, compression dressing right wrist from radial artery cath site Neurologic alert oriented no focal motor deficit    Diagnostic Studies & Laboratory data:     Recent Radiology Findings:   Dg Chest 2 View  11/06/2012   *RADIOLOGY REPORT*  Clinical Data: Shortness of breath  CHEST - 2 VIEW  Comparison: None.  Findings: Cardiomediastinal silhouette is unremarkable.  Mild hyperinflation.  Mild thoracic dextroscoliosis.  No acute infiltrate or pulmonary edema.  Moderate sized hiatal hernia. Left basilar atelectasis or scarring.  IMPRESSION: No active disease.  Mild thoracic dextroscoliosis.  Moderate sized hiatal hernia.  Clinically significant discrepancy from primary report, if provided: None   Original Report Authenticated By: Natasha Mead, M.D.      Recent Lab Findings: Lab Results  Component Value Date   WBC 7.3 11/07/2012   HGB 13.3 11/07/2012   HCT 38.3* 11/07/2012   PLT 253 11/07/2012   GLUCOSE 113* 11/07/2012   TRIG 73 11/06/2012   LDLCALC 92 11/06/2012   ALT 16 11/06/2012   AST 23 11/06/2012   NA 141 11/07/2012   K 3.9 11/07/2012   CL 106 11/07/2012   CREATININE 1.48* 11/07/2012   BUN 22 11/07/2012   CO2 23 11/07/2012   INR 1.02 11/07/2012      Assessment / Plan:      Severe three-vessel CAD, unstable angina, preserved LV systolic function Plan CABG early next week. Systolic murmur, 2-D echocardiogram pending      @ME1 @ 11/07/2012  5:51 PM

## 2012-11-07 NOTE — Telephone Encounter (Signed)
No precert required 

## 2012-11-08 DIAGNOSIS — I200 Unstable angina: Secondary | ICD-10-CM

## 2012-11-08 DIAGNOSIS — R079 Chest pain, unspecified: Secondary | ICD-10-CM

## 2012-11-08 DIAGNOSIS — I2510 Atherosclerotic heart disease of native coronary artery without angina pectoris: Principal | ICD-10-CM

## 2012-11-08 DIAGNOSIS — Z01810 Encounter for preprocedural cardiovascular examination: Secondary | ICD-10-CM

## 2012-11-08 DIAGNOSIS — I517 Cardiomegaly: Secondary | ICD-10-CM

## 2012-11-08 LAB — URINALYSIS, ROUTINE W REFLEX MICROSCOPIC
Bilirubin Urine: NEGATIVE
Glucose, UA: NEGATIVE mg/dL
Hgb urine dipstick: NEGATIVE
Ketones, ur: NEGATIVE mg/dL
Leukocytes, UA: NEGATIVE
Nitrite: NEGATIVE
Protein, ur: NEGATIVE mg/dL
Specific Gravity, Urine: 1.011 (ref 1.005–1.030)
Urobilinogen, UA: 0.2 mg/dL (ref 0.0–1.0)
pH: 5 (ref 5.0–8.0)

## 2012-11-08 LAB — BASIC METABOLIC PANEL
Calcium: 9 mg/dL (ref 8.4–10.5)
GFR calc Af Amer: 51 mL/min — ABNORMAL LOW (ref >90)
GFR calc non Af Amer: 44 mL/min — ABNORMAL LOW (ref >90)
Glucose, Bld: 101 mg/dL — ABNORMAL HIGH (ref 70–99)
Potassium: 3.9 mEq/L (ref 3.5–5.1)
Sodium: 139 mEq/L (ref 135–145)

## 2012-11-08 LAB — LIPID PANEL
HDL: 53 mg/dL (ref >39)
Total CHOL/HDL Ratio: 2.5 RATIO
VLDL: 11 mg/dL (ref 0–40)

## 2012-11-08 LAB — HEPARIN LEVEL (UNFRACTIONATED)
Heparin Unfractionated: 0.22 IU/mL — ABNORMAL LOW (ref 0.30–0.70)
Heparin Unfractionated: 0.37 IU/mL (ref 0.30–0.70)

## 2012-11-08 LAB — HEMOGLOBIN A1C
Hgb A1c MFr Bld: 5.5 % (ref <5.7)
Mean Plasma Glucose: 111 mg/dL (ref <117)

## 2012-11-08 LAB — SURGICAL PCR SCREEN
MRSA, PCR: NEGATIVE
Staphylococcus aureus: NEGATIVE

## 2012-11-08 LAB — CBC
Hemoglobin: 11.7 g/dL — ABNORMAL LOW (ref 13.0–17.0)
MCH: 33.9 pg (ref 26.0–34.0)
MCHC: 34 g/dL (ref 30.0–36.0)
Platelets: 218 10*3/uL (ref 150–400)

## 2012-11-08 NOTE — Progress Notes (Signed)
PROGRESS NOTE  Subjective:   Jon Reid was admitted to the hospital yesterday following cath - he has 3 V CAD.  Angina with very minimal exertion.   Objective:    Vital Signs:   Temp:  [97.8 F (36.6 C)] 97.8 F (36.6 C) (08/23 0446) Pulse Rate:  [53-61] 58 (08/23 0933) Resp:  [18] 18 (08/23 0446) BP: (128-149)/(55-62) 130/62 mmHg (08/23 0933) SpO2:  [98 %] 98 % (08/23 0446)  Last BM Date: 11/06/12   24-hour weight change: Weight change:   Weight trends: Filed Weights   11/07/12 0721  Weight: 175 lb (79.379 kg)    Intake/Output:        Physical Exam: BP 130/62  Pulse 58  Temp(Src) 97.8 F (36.6 C) (Oral)  Resp 18  Ht 6' (1.829 m)  Wt 175 lb (79.379 kg)  BMI 23.73 kg/m2  SpO2 98%  General: Vital signs reviewed and noted.   Head: Normocephalic, atraumatic.  Eyes: conjunctivae/corneas clear.  EOM's intact.   Throat: normal  Neck:  normal  Lungs:    clear  Heart:  RR, normal S1, S2  Abdomen:  Soft, non-tender, non-distended    Extremities: Right radial cath site is normal   Neurologic: A&O X3, CN II - XII are grossly intact.   Psych: Normal     Labs: BMET:  Recent Labs  11/07/12 0736 11/08/12 0500  NA 141 139  K 3.9 3.9  CL 106 107  CO2 23 20  GLUCOSE 113* 101*  BUN 22 22  CREATININE 1.48* 1.43*  CALCIUM 9.5 9.0    Liver function tests:  Recent Labs  11/06/12 1058  AST 23  ALT 16  ALKPHOS 78  BILITOT 0.7  PROT 7.4  ALBUMIN 4.6   No results found for this basename: LIPASE, AMYLASE,  in the last 72 hours  CBC:  Recent Labs  11/07/12 0736 11/08/12 0500  WBC 7.3 5.7  HGB 13.3 11.7*  HCT 38.3* 34.4*  MCV 100.0 99.7  PLT 253 218    Cardiac Enzymes: No results found for this basename: CKTOTAL, CKMB, TROPONINI,  in the last 72 hours  Coagulation Studies:  Recent Labs  11/07/12 0736  LABPROT 13.2  INR 1.02    Other: No components found with this basename: POCBNP,  No results found for this basename: DDIMER,  in the  last 72 hours No results found for this basename: HGBA1C,  in the last 72 hours  Recent Labs  11/08/12 0500  CHOL 130  HDL 53  LDLCALC 66  TRIG 55  CHOLHDL 2.5   No results found for this basename: TSH, T4TOTAL, FREET3, T3FREE, THYROIDAB,  in the last 72 hours No results found for this basename: VITAMINB12, FOLATE, FERRITIN, TIBC, IRON, RETICCTPCT,  in the last 72 hours   Other results:  Tele:  NSR   Medications:    Infusions: . sodium chloride 500 mL (11/07/12 1142)  . heparin 1,050 Units/hr (11/08/12 0932)    Scheduled Medications: . allopurinol  100 mg Oral Daily  . atorvastatin  80 mg Oral q1800  . cholecalciferol  2,000 Units Oral Daily  . colchicine  0.6 mg Oral Daily  . DULoxetine  30 mg Oral 3 times weekly  . irbesartan  300 mg Oral Daily   And  . hydrochlorothiazide  12.5 mg Oral Daily  . metoprolol succinate  25 mg Oral Daily    1. CAD:  He is currently stable.  Continue current meds.  For CABG next week.  Disposition:  Length of Stay: 1  [name], [name], MD, [facility] 11/08/2012, 9:50 AM Office [phone] Pager [phone]

## 2012-11-08 NOTE — Progress Notes (Signed)
  Echocardiogram 2D Echocardiogram has been performed.  Kelse Ploch FRANCES 11/08/2012, 12:45 PM 

## 2012-11-08 NOTE — Progress Notes (Addendum)
Pre-op Cardiac Surgery  Carotid Findings:  1-39% ICA stenosis.  Vertebral artery flow is antegrade.  Upper Extremity Right Left  Brachial Pressures 141 147  Radial Waveforms Tri Tri  Ulnar Waveforms Tri Tri  Palmar Arch (Allen's Test) Normal  Decreases with radial compression, but not greater than 50%. Normal with ulnar compression   Farrel Demark, RDMS, RVT Sherren Kerns, RVS  11/09/2012

## 2012-11-08 NOTE — Progress Notes (Signed)
ANTICOAGULATION CONSULT NOTE - Follow Up Consult  Pharmacy Consult for Heparin Indication: 3 V CAD, considering CABG  No Known Allergies  Patient Measurements: Height: 6' (182.9 cm) Weight: 175 lb (79.379 kg) IBW/kg (Calculated) : 77.6 Heparin Dosing Weight:   Vital Signs: Temp: 98.4 F (36.9 C) (08/23 1300) Temp src: Oral (08/23 1300) BP: 151/58 mmHg (08/23 1300) Pulse Rate: 61 (08/23 1300)  Labs:  Recent Labs  11/06/12 1058 11/06/12 1229 11/07/12 0736 11/08/12 0500 11/08/12 1725  HGB  --  13.3* 13.3 11.7*  --   HCT  --  40.1* 38.3* 34.4*  --   PLT  --   --  253 218  --   APTT  --   --  38*  --   --   LABPROT  --   --  13.2  --   --   INR  --   --  1.02  --   --   HEPARINUNFRC  --   --   --  0.22* 0.37  CREATININE 1.53*  --  1.48* 1.43*  --     Estimated Creatinine Clearance: 43.7 ml/min (by C-G formula based on Cr of 1.43).   Medications:  Scheduled:  . allopurinol  100 mg Oral Daily  . atorvastatin  80 mg Oral q1800  . cholecalciferol  2,000 Units Oral Daily  . colchicine  0.6 mg Oral Daily  . DULoxetine  30 mg Oral 3 times weekly  . irbesartan  300 mg Oral Daily   And  . hydrochlorothiazide  12.5 mg Oral Daily  . metoprolol succinate  25 mg Oral Daily    Assessment: 79yo male s/p cardiac cath and found to have severe 3V CAD, considering CABG.  Heparin level was low this AM on 950 units/hr and rate was increased to 1050units/hr. Repeat level was therapeutic at 0.37.  No bleeding problems noted.  Goal of Therapy:  Heparin level 0.3-0.7 units/ml Monitor platelets by anticoagulation protocol: Yes   Plan:  1.  Continue Heparin to 1050 units/hr 2.  Daily HL and CBC   Zissy Hamlett D. Nicolae Vasek, PharmD Clinical Pharmacist Pager: [phone] 11/08/2012 6:40 PM

## 2012-11-08 NOTE — Progress Notes (Signed)
CARDIAC REHAB PHASE I   PRE:  Rate/Rhythm: Sinus Brady 56  BP:      Standing: 132/60   SaO2: 94% Room Air  MODE:  Ambulation: 550 ft   POST:  Rate/Rhythem: Sinus 61  BP:    Sitting: 122/60     SaO2: 99% Room Air [phone] Patient ambulated in the hallway without complaints or chest pain. Instructed patient how to use incentive spirometry and reviewed [facility] booklet with patient.  Jon Reid told me he is not interested in watching  The pre OHS vidoe his wife has had valve surgery and is aware of the process.  Whitaker, Jon Bruce RN BSN

## 2012-11-08 NOTE — Progress Notes (Addendum)
ANTICOAGULATION CONSULT NOTE - Follow Up Consult  Pharmacy Consult for Heparin Indication: 3 V CAD, considering CABG  No Known Allergies  Patient Measurements: Height: 6' (182.9 cm) Weight: 175 lb (79.379 kg) IBW/kg (Calculated) : 77.6 Heparin Dosing Weight:   Vital Signs: Temp: 97.8 F (36.6 C) (08/23 0446) BP: 128/59 mmHg (08/23 0446) Pulse Rate: 53 (08/23 0446)  Labs:  Recent Labs  11/06/12 1058 11/06/12 1229 11/07/12 0736 11/08/12 0500  HGB  --  13.3* 13.3 11.7*  HCT  --  40.1* 38.3* 34.4*  PLT  --   --  253 218  APTT  --   --  38*  --   LABPROT  --   --  13.2  --   INR  --   --  1.02  --   HEPARINUNFRC  --   --   --  0.22*  CREATININE 1.53*  --  1.48*  --     Estimated Creatinine Clearance: 42.2 ml/min (by C-G formula based on Cr of 1.48).   Medications:  Scheduled:  . allopurinol  100 mg Oral Daily  . atorvastatin  80 mg Oral q1800  . cholecalciferol  2,000 Units Oral Daily  . colchicine  0.6 mg Oral Daily  . DULoxetine  30 mg Oral 3 times weekly  . irbesartan  300 mg Oral Daily   And  . hydrochlorothiazide  12.5 mg Oral Daily  . metoprolol succinate  25 mg Oral Daily    Assessment: 79yo male s/p cardiac cath and found to have severe 3V CAD, considering CABG.  Heparin level is low this AM on 950 units/hr.  No bleeding problems noted.  Goal of Therapy:  Heparin level 0.3-0.7 units/ml Monitor platelets by anticoagulation protocol: Yes   Plan:  1.  Increase Heparin to 1050 units/hr 2.  Check Heparin level 8hr   Marisue Humble, PharmD Clinical Pharmacist [facility]

## 2012-11-09 DIAGNOSIS — E785 Hyperlipidemia, unspecified: Secondary | ICD-10-CM

## 2012-11-09 DIAGNOSIS — Z01810 Encounter for preprocedural cardiovascular examination: Secondary | ICD-10-CM

## 2012-11-09 LAB — HEPARIN LEVEL (UNFRACTIONATED): Heparin Unfractionated: 0.38 IU/mL (ref 0.30–0.70)

## 2012-11-09 LAB — CBC
Hemoglobin: 11.7 g/dL — ABNORMAL LOW (ref 13.0–17.0)
MCH: 34.3 pg — ABNORMAL HIGH (ref 26.0–34.0)
MCHC: 34.9 g/dL (ref 30.0–36.0)
RDW: 12 % (ref 11.5–15.5)

## 2012-11-09 NOTE — Progress Notes (Signed)
2 Days Post-Op Procedure(s) (LRB): LEFT HEART CATHETERIZATION WITH CORONARY ANGIOGRAM (N/A) Subjective:  Severe three-vessel CAD with unstable angina, now stable on IV heparin Preop CABG PFTs normal, preop 2-D echo shows at least mild aortic stenosis with good LV function Carotid Dopplers are pending sinus rhythm  Objective: Vital signs in last 24 hours: Temp:  [97.7 F (36.5 C)-98.2 F (36.8 C)] 97.7 F (36.5 C) (08/24 1412) Pulse Rate:  [49-58] 58 (08/24 1412) Cardiac Rhythm:  [-]  Resp:  [16-20] 20 (08/24 1412) BP: (105-123)/(49-66) 105/49 mmHg (08/24 1412) SpO2:  [97 %-100 %] 100 % (08/24 1412)  Hemodynamic parameters for last 24 hours:    Intake/Output from previous day:   Intake/Output this shift: Total I/O In: 720 [P.O.:720] Out: -   Exam Lungs clear No cath hematoma Sinus rhythm  Lab Results:  Recent Labs  11/08/12 0500 11/09/12 0430  WBC 5.7 5.6  HGB 11.7* 11.7*  HCT 34.4* 33.5*  PLT 218 208   BMET:  Recent Labs  11/07/12 0736 11/08/12 0500  NA 141 139  K 3.9 3.9  CL 106 107  CO2 23 20  GLUCOSE 113* 101*  BUN 22 22  CREATININE 1.48* 1.43*  CALCIUM 9.5 9.0    PT/INR:  Recent Labs  11/07/12 0736  LABPROT 13.2  INR 1.02   ABG    Component Value Date/Time   PHART 7.412 11/07/2012 1800   HCO3 21.7 11/07/2012 1800   TCO2 22.8 11/07/2012 1800   ACIDBASEDEF 2.2* 11/07/2012 1800   O2SAT 96.4 11/07/2012 1800   CBG (last 3)  No results found for this basename: GLUCAP,  in the last 72 hours  Assessment/Plan: S/P Procedure(s) (LRB): LEFT HEART CATHETERIZATION WITH CORONARY ANGIOGRAM (N/A) Plan CABG multivessel August 26 Procedure discussed with patient   LOS: 2 days    VAN TRIGT III,PETER 11/09/2012

## 2012-11-09 NOTE — Progress Notes (Signed)
PROGRESS NOTE  Subjective:   Jon Reid was admitted to the hospital  following cath - he has 3 V CAD.  Angina with very minimal exertion.  No cp since being here on heparin drip.   Objective:    Vital Signs:   Temp:  [98 F (36.7 C)-98.4 F (36.9 C)] 98.2 F (36.8 C) (08/24 0507) Pulse Rate:  [53-61] 54 (08/24 0507) Resp:  [16-18] 16 (08/24 0507) BP: (107-151)/(58-66) 123/66 mmHg (08/24 0507) SpO2:  [97 %-99 %] 98 % (08/24 0507)  Last BM Date: 11/08/12   24-hour weight change: Weight change:   Weight trends: Filed Weights   11/07/12 0721  Weight: 175 lb (79.379 kg)    Intake/Output:    Total I/O In: 360 [P.O.:360] Out: -    Physical Exam: BP 123/66  Pulse 54  Temp(Src) 98.2 F (36.8 C) (Oral)  Resp 16  Ht 6' (1.829 m)  Wt 175 lb (79.379 kg)  BMI 23.73 kg/m2  SpO2 98%  General: Vital signs reviewed and noted.   Head: Normocephalic, atraumatic.  Eyes: conjunctivae/corneas clear.  EOM's intact.   Throat: normal  Neck:  normal  Lungs:  clear  Heart:  RR, normal S1, S2  Abdomen:  Soft, non-tender, non-distended    Extremities: Right radial cath site is normal   Neurologic: A&O X3, CN II - XII are grossly intact.   Psych: Normal     Labs: BMET:  Recent Labs  11/07/12 0736 11/08/12 0500  NA 141 139  K 3.9 3.9  CL 106 107  CO2 23 20  GLUCOSE 113* 101*  BUN 22 22  CREATININE 1.48* 1.43*  CALCIUM 9.5 9.0    Liver function tests:  Recent Labs  11/06/12 1058  AST 23  ALT 16  ALKPHOS 78  BILITOT 0.7  PROT 7.4  ALBUMIN 4.6   No results found for this basename: LIPASE, AMYLASE,  in the last 72 hours  CBC:  Recent Labs  11/08/12 0500 11/09/12 0430  WBC 5.7 5.6  HGB 11.7* 11.7*  HCT 34.4* 33.5*  MCV 99.7 98.2  PLT 218 208    Cardiac Enzymes: No results found for this basename: CKTOTAL, CKMB, TROPONINI,  in the last 72 hours  Coagulation Studies:  Recent Labs  11/07/12 0736  LABPROT 13.2  INR 1.02    Other: No components  found with this basename: POCBNP,  No results found for this basename: DDIMER,  in the last 72 hours  Recent Labs  11/08/12 0500  HGBA1C 5.5    Recent Labs  11/08/12 0500  CHOL 130  HDL 53  LDLCALC 66  TRIG 55  CHOLHDL 2.5   No results found for this basename: TSH, T4TOTAL, FREET3, T3FREE, THYROIDAB,  in the last 72 hours No results found for this basename: VITAMINB12, FOLATE, FERRITIN, TIBC, IRON, RETICCTPCT,  in the last 72 hours   Other results:  Tele:  NSR   Medications:    Infusions: . sodium chloride 500 mL (11/07/12 1142)  . heparin 1,050 Units/hr (11/08/12 2057)    Scheduled Medications: . allopurinol  100 mg Oral Daily  . atorvastatin  80 mg Oral q1800  . cholecalciferol  2,000 Units Oral Daily  . colchicine  0.6 mg Oral Daily  . DULoxetine  30 mg Oral 3 times weekly  . irbesartan  300 mg Oral Daily   And  . hydrochlorothiazide  12.5 mg Oral Daily  . metoprolol succinate  25 mg Oral Daily    1. CAD:  He is currently stable.  Continue current meds.  For CABG tomorrow or Tuesday.  LAbs are stable.   Echo shows well preserved LV function but with mild hypokinesis of the anterior wall.  LAE.    Disposition:  Length of Stay: 2  Jon Reid, Jon Reid., MD, [facility] 11/09/2012, 8:59 AM Office [phone] Pager [phone]

## 2012-11-09 NOTE — Progress Notes (Signed)
ANTICOAGULATION CONSULT NOTE - Follow Up Consult  Pharmacy Consult for Heparin Indication: 3 V CAD, considering CABG  No Known Allergies  Patient Measurements: Height: 6' (182.9 cm) Weight: 175 lb (79.379 kg) IBW/kg (Calculated) : 77.6 Heparin Dosing Weight:   Vital Signs: Temp: 98.2 F (36.8 C) (08/24 0507) Temp src: Oral (08/23 2100) BP: 123/66 mmHg (08/24 0507) Pulse Rate: 54 (08/24 0507)  Labs:  Recent Labs  11/06/12 1058  11/07/12 0736 11/08/12 0500 11/08/12 1725 11/09/12 0430  HGB  --   < > 13.3 11.7*  --  11.7*  HCT  --   < > 38.3* 34.4*  --  33.5*  PLT  --   --  253 218  --  208  APTT  --   --  38*  --   --   --   LABPROT  --   --  13.2  --   --   --   INR  --   --  1.02  --   --   --   HEPARINUNFRC  --   --   --  0.22* 0.37 0.38  CREATININE 1.53*  --  1.48* 1.43*  --   --   < > = values in this interval not displayed.  Estimated Creatinine Clearance: 43.7 ml/min (by C-G formula based on Cr of 1.43).   Medications:  Scheduled:  . allopurinol  100 mg Oral Daily  . atorvastatin  80 mg Oral q1800  . cholecalciferol  2,000 Units Oral Daily  . colchicine  0.6 mg Oral Daily  . DULoxetine  30 mg Oral 3 times weekly  . irbesartan  300 mg Oral Daily   And  . hydrochlorothiazide  12.5 mg Oral Daily  . metoprolol succinate  25 mg Oral Daily    Assessment: 79 y/o male s/p cardiac cath and found to have severe 3V CAD, considering CABG next week. Heparin level was therapeutic at 0.38. H/H low but stable and platelets are normal. No current S/S bleeding.  Goal of Therapy:  Heparin level 0.3-0.7 units/ml Monitor platelets by anticoagulation protocol: Yes   Plan:  - Continue heparin IV 1050 units/hr - Daily HL, CBC, and S/S bleeding  Liany Mumpower A. Lenon Ahmadi, PharmD Clinical Pharmacist - Resident Pager: [phone] Pharmacy: [phone] 11/09/2012 8:49 AM

## 2012-11-10 ENCOUNTER — Encounter (HOSPITAL_COMMUNITY): Payer: MEDICARE

## 2012-11-10 DIAGNOSIS — I2510 Atherosclerotic heart disease of native coronary artery without angina pectoris: Secondary | ICD-10-CM

## 2012-11-10 LAB — COMPREHENSIVE METABOLIC PANEL
ALT: 15 U/L (ref 0–53)
Albumin: 3.4 g/dL — ABNORMAL LOW (ref 3.5–5.2)
Alkaline Phosphatase: 63 U/L (ref 39–117)
BUN: 25 mg/dL — ABNORMAL HIGH (ref 6–23)
Chloride: 104 mEq/L (ref 96–112)
GFR calc Af Amer: 48 mL/min — ABNORMAL LOW (ref >90)
Glucose, Bld: 106 mg/dL — ABNORMAL HIGH (ref 70–99)
Potassium: 4 mEq/L (ref 3.5–5.1)
Sodium: 137 mEq/L (ref 135–145)
Total Bilirubin: 0.3 mg/dL (ref 0.3–1.2)
Total Protein: 6.6 g/dL (ref 6.0–8.3)

## 2012-11-10 LAB — CBC
HCT: 34.2 % — ABNORMAL LOW (ref 39.0–52.0)
HCT: 33.8 % — ABNORMAL LOW (ref 39.0–52.0)
Hemoglobin: 11.4 g/dL — ABNORMAL LOW (ref 13.0–17.0)
MCH: 33.6 pg (ref 26.0–34.0)
MCHC: 34.5 g/dL (ref 30.0–36.0)
MCHC: 33.7 g/dL (ref 30.0–36.0)
MCV: 98.8 fL (ref 78.0–100.0)
MCV: 99.7 fL (ref 78.0–100.0)
Platelets: 224 10*3/uL (ref 150–400)
Platelets: 242 10*3/uL (ref 150–400)
RBC: 3.39 MIL/uL — ABNORMAL LOW (ref 4.22–5.81)
RDW: 11.9 % (ref 11.5–15.5)
RDW: 12 % (ref 11.5–15.5)
WBC: 6.2 10*3/uL (ref 4.0–10.5)

## 2012-11-10 LAB — HEPARIN LEVEL (UNFRACTIONATED): Heparin Unfractionated: 0.42 IU/mL (ref 0.30–0.70)

## 2012-11-10 LAB — ABO/RH: ABO/RH(D): O POS

## 2012-11-10 MED ORDER — CHLORHEXIDINE GLUCONATE 4 % EX LIQD
60.0000 mL | Freq: Once | CUTANEOUS | Status: AC
  Administered 2012-11-11: 4 via TOPICAL
  Filled 2012-11-10: qty 60

## 2012-11-10 MED ORDER — DEXTROSE 5 % IV SOLN
750.0000 mg | INTRAVENOUS | Status: DC
  Filled 2012-11-10: qty 750

## 2012-11-10 MED ORDER — PLASMA-LYTE 148 IV SOLN
INTRAVENOUS | Status: DC
  Filled 2012-11-10: qty 2.5

## 2012-11-10 MED ORDER — NITROGLYCERIN IN D5W 200-5 MCG/ML-% IV SOLN
2.0000 ug/min | INTRAVENOUS | Status: DC
  Filled 2012-11-10: qty 250

## 2012-11-10 MED ORDER — SODIUM CHLORIDE 0.9 % IV SOLN
INTRAVENOUS | Status: DC
  Filled 2012-11-10: qty 40

## 2012-11-10 MED ORDER — CHLORHEXIDINE GLUCONATE 4 % EX LIQD
60.0000 mL | Freq: Once | CUTANEOUS | Status: AC
  Administered 2012-11-10: 4 via TOPICAL
  Filled 2012-11-10: qty 60

## 2012-11-10 MED ORDER — DOPAMINE-DEXTROSE 3.2-5 MG/ML-% IV SOLN
2.0000 ug/kg/min | INTRAVENOUS | Status: DC
  Filled 2012-11-10: qty 250

## 2012-11-10 MED ORDER — TEMAZEPAM 15 MG PO CAPS: 15.0000 mg | ORAL_CAPSULE | Freq: Once | ORAL | Status: AC | PRN

## 2012-11-10 MED ORDER — EPINEPHRINE HCL 1 MG/ML IJ SOLN
0.5000 ug/min | INTRAVENOUS | Status: DC
  Filled 2012-11-10: qty 4

## 2012-11-10 MED ORDER — BISACODYL 5 MG PO TBEC
5.0000 mg | DELAYED_RELEASE_TABLET | Freq: Once | ORAL | Status: DC
  Filled 2012-11-10: qty 1

## 2012-11-10 MED ORDER — POTASSIUM CHLORIDE 2 MEQ/ML IV SOLN
80.0000 meq | INTRAVENOUS | Status: DC
  Filled 2012-11-10: qty 40

## 2012-11-10 MED ORDER — SODIUM CHLORIDE 0.9 % IV SOLN
INTRAVENOUS | Status: DC
  Filled 2012-11-10: qty 30

## 2012-11-10 MED ORDER — CEFUROXIME SODIUM 1.5 G IJ SOLR
1.5000 g | INTRAMUSCULAR | Status: AC
  Administered 2012-11-11: .75 g via INTRAVENOUS
  Administered 2012-11-11: 1.5 g via INTRAVENOUS
  Filled 2012-11-10: qty 1.5

## 2012-11-10 MED ORDER — MAGNESIUM SULFATE 50 % IJ SOLN
40.0000 meq | INTRAMUSCULAR | Status: DC
  Filled 2012-11-10: qty 10

## 2012-11-10 MED ORDER — PHENYLEPHRINE HCL 10 MG/ML IJ SOLN
30.0000 ug/min | INTRAVENOUS | Status: DC
  Filled 2012-11-10: qty 2

## 2012-11-10 MED ORDER — VANCOMYCIN HCL 10 G IV SOLR
1250.0000 mg | INTRAVENOUS | Status: AC
  Administered 2012-11-11: 1250 mg via INTRAVENOUS
  Filled 2012-11-10: qty 1250

## 2012-11-10 MED ORDER — METOPROLOL TARTRATE 12.5 MG HALF TABLET
12.5000 mg | ORAL_TABLET | Freq: Once | ORAL | Status: AC
  Administered 2012-11-11: 12.5 mg via ORAL
  Filled 2012-11-10: qty 1

## 2012-11-10 MED ORDER — DEXMEDETOMIDINE HCL IN NACL 400 MCG/100ML IV SOLN
0.1000 ug/kg/h | INTRAVENOUS | Status: DC
  Filled 2012-11-10: qty 100

## 2012-11-10 MED ORDER — SODIUM CHLORIDE 0.9 % IV SOLN
INTRAVENOUS | Status: DC
  Filled 2012-11-10: qty 1

## 2012-11-10 NOTE — Progress Notes (Signed)
3 Days Post-Op Procedure(s) (LRB): LEFT HEART CATHETERIZATION WITH CORONARY ANGIOGRAM (N/A) Subjective: 3v CAD for CABG in am Stable on IV heparin  Objective: Vital signs in last 24 hours: Temp:  [97.4 F (36.3 C)-97.6 F (36.4 C)] 97.4 F (36.3 C) (08/25 1629) Pulse Rate:  [49-62] 62 (08/25 1629) Cardiac Rhythm:  [-] Sinus bradycardia;Heart block (08/25 0838) Resp:  [16-18] 18 (08/25 1629) BP: (115-127)/(55-64) 124/56 mmHg (08/25 1629) SpO2:  [98 %-99 %] 98 % (08/25 1629)  Hemodynamic parameters for last 24 hours:  stable  Intake/Output from previous day: 08/24 0701 - 08/25 0700 In: 1234.5 [P.O.:960; I.V.:274.5] Out: -  Intake/Output this shift: Total I/O In: 700 [P.O.:700] Out: -   Lungs clear Faint SEM  Lab Results:  Recent Labs  11/09/12 0430 11/10/12 0430  WBC 5.6 5.2  HGB 11.7* 11.8*  HCT 33.5* 34.2*  PLT 208 224   BMET:  Recent Labs  11/08/12 0500  NA 139  K 3.9  CL 107  CO2 20  GLUCOSE 101*  BUN 22  CREATININE 1.43*  CALCIUM 9.0    PT/INR: No results found for this basename: LABPROT, INR,  in the last 72 hours ABG    Component Value Date/Time   PHART 7.412 11/07/2012 1800   HCO3 21.7 11/07/2012 1800   TCO2 22.8 11/07/2012 1800   ACIDBASEDEF 2.2* 11/07/2012 1800   O2SAT 96.4 11/07/2012 1800   CBG (last 3)  No results found for this basename: GLUCAP,  in the last 72 hours  Assessment/Plan: S/P Procedure(s) (LRB): LEFT HEART CATHETERIZATION WITH CORONARY ANGIOGRAM (N/A) CABG in AM   LOS: 3 days    VAN TRIGT III,PETER 11/10/2012

## 2012-11-10 NOTE — Progress Notes (Signed)
PROGRESS NOTE  Subjective:   Jon Reid was admitted to the hospital  following cath - he has 3 V CAD.  Angina with very minimal exertion.  No cp since being here on heparin drip. Planned CABG tomorrow.   Objective:    Vital Signs:   Temp:  [97.4 F (36.3 C)-98.3 F (36.8 C)] 97.6 F (36.4 C) (08/25 0535) Pulse Rate:  [49-60] 52 (08/25 0535) Resp:  [16-20] 16 (08/25 0535) BP: (105-127)/(49-64) 126/64 mmHg (08/25 0535) SpO2:  [98 %-100 %] 99 % (08/25 0535)  Last BM Date: 11/08/12   24-hour weight change: Weight change:   Weight trends: Filed Weights   11/07/12 0721  Weight: 175 lb (79.379 kg)    Intake/Output:  08/24 0701 - 08/25 0700 In: 1234.5 [P.O.:960; I.V.:274.5] Out: -      Physical Exam: BP 126/64  Pulse 52  Temp(Src) 97.6 F (36.4 C) (Oral)  Resp 16  Ht 6' (1.829 m)  Wt 175 lb (79.379 kg)  BMI 23.73 kg/m2  SpO2 99%  General: Vital signs reviewed and noted.   Head: Normocephalic, atraumatic.  Eyes: conjunctivae/corneas clear.  EOM's intact.   Throat: normal  Neck:  normal  Lungs:  clear  Heart:  RR, normal S1, S2, soft systolic murmur.  Abdomen:  Soft, non-tender, non-distended    Extremities: Right radial cath site is normal   Neurologic: A&O X3, CN II - XII are grossly intact.   Psych: Normal     Labs: BMET:  Recent Labs  11/08/12 0500  NA 139  K 3.9  CL 107  CO2 20  GLUCOSE 101*  BUN 22  CREATININE 1.43*  CALCIUM 9.0    Liver function tests: No results found for this basename: AST, ALT, ALKPHOS, BILITOT, PROT, ALBUMIN,  in the last 72 hours No results found for this basename: LIPASE, AMYLASE,  in the last 72 hours  CBC:  Recent Labs  11/09/12 0430 11/10/12 0430  WBC 5.6 5.2  HGB 11.7* 11.8*  HCT 33.5* 34.2*  MCV 98.2 98.8  PLT 208 224    Cardiac Enzymes: No results found for this basename: CKTOTAL, CKMB, TROPONINI,  in the last 72 hours  Coagulation Studies: No results found for this basename: LABPROT, INR,  in  the last 72 hours  Other: No components found with this basename: POCBNP,  No results found for this basename: DDIMER,  in the last 72 hours  Recent Labs  11/08/12 0500  HGBA1C 5.5    Recent Labs  11/08/12 0500  CHOL 130  HDL 53  LDLCALC 66  TRIG 55  CHOLHDL 2.5   No results found for this basename: TSH, T4TOTAL, FREET3, T3FREE, THYROIDAB,  in the last 72 hours No results found for this basename: VITAMINB12, FOLATE, FERRITIN, TIBC, IRON, RETICCTPCT,  in the last 72 hours   Other results:  Tele:  NSR   Medications:    Infusions: . sodium chloride 500 mL (11/07/12 1142)  . heparin 1,050 Units/hr (11/09/12 2100)    Scheduled Medications: . allopurinol  100 mg Oral Daily  . atorvastatin  80 mg Oral q1800  . cholecalciferol  2,000 Units Oral Daily  . colchicine  0.6 mg Oral Daily  . DULoxetine  30 mg Oral 3 times weekly  . irbesartan  300 mg Oral Daily   And  . hydrochlorothiazide  12.5 mg Oral Daily  . metoprolol succinate  25 mg Oral Daily    1. CAD:  He is currently stable.  Continue current  meds.  For CABG tomorrow or Tuesday.  LAbs are stable.   Echo shows well preserved LV function but with mild hypokinesis of the anterior wall.  LAE.  For CABG tomorrow   Disposition:  Length of Stay: 3  Jon Reid, Jon Reid., MD, [facility] 11/10/2012, 7:58 AM Office [phone] Pager [phone]

## 2012-11-10 NOTE — Progress Notes (Signed)
CARDIAC REHAB PHASE I   PRE:  Rate/Rhythm: 53SB  BP:  Supine: 130/60  Sitting:   Standing:    SaO2: 97%RA  MODE:  Ambulation: 850 ft   POST:  Rate/Rhythm: 67  BP:  Supine:   Sitting: 140/60  Standing:    SaO2: 99%RA 1100-1124 Pt walked 850 ft with steady gait. Tolerated well. No CP. Encouraged pt to watch preop video later when family here. Pt states since wife had valve surgery he feels like he knows what to expect. States he can get 2500 ml on IS. Discussed sternal precautions and demonstrated how to get up and down without using arms. Discussed importance of mobility after surgery. Pt states he will try for gold star for walking after surgery.   Luetta Nutting, RN BSN  11/10/2012 11:21 AM

## 2012-11-10 NOTE — Progress Notes (Signed)
ANTICOAGULATION CONSULT NOTE - Follow Up Consult  Pharmacy Consult for Heparin Indication: 3 V CAD, considering CABG  No Known Allergies  Patient Measurements: Height: 6' (182.9 cm) Weight: 175 lb (79.379 kg) IBW/kg (Calculated) : 77.6 Heparin Dosing Weight:   Vital Signs: Temp: 97.6 F (36.4 C) (08/25 0535) BP: 115/55 mmHg (08/25 1017) Pulse Rate: 55 (08/25 1017)  Labs:  Recent Labs  11/08/12 0500 11/08/12 1725 11/09/12 0430 11/10/12 0430  HGB 11.7*  --  11.7* 11.8*  HCT 34.4*  --  33.5* 34.2*  PLT 218  --  208 224  HEPARINUNFRC 0.22* 0.37 0.38 0.42  CREATININE 1.43*  --   --   --     Estimated Creatinine Clearance: 43.7 ml/min (by C-G formula based on Cr of 1.43).   Medications:  Scheduled:  . allopurinol  100 mg Oral Daily  . atorvastatin  80 mg Oral q1800  . cholecalciferol  2,000 Units Oral Daily  . colchicine  0.6 mg Oral Daily  . DULoxetine  30 mg Oral 3 times weekly  . irbesartan  300 mg Oral Daily   And  . hydrochlorothiazide  12.5 mg Oral Daily  . metoprolol succinate  25 mg Oral Daily    Assessment: 79 y/o male s/p cardiac cath and found to have severe 3V CAD, palnning CABG Tuesday.   Heparin drip 1050 uts/hr heparin level is therapeutic at 0.42. CBC stable No current S/S bleeding.  Goal of Therapy:  Heparin level 0.3-0.7 units/ml Monitor platelets by anticoagulation protocol: Yes   Plan:  - Continue heparin IV 1050 units/hr - Daily HL, CBC, and S/S bleeding   Leota Sauers Pharm.D. CPP, BCPS Clinical Pharmacist [phone] 11/10/2012 12:49 PM    Pharmacy: [phone] 11/10/2012 12:44 PM

## 2012-11-11 ENCOUNTER — Inpatient Hospital Stay (HOSPITAL_COMMUNITY): Payer: MEDICARE

## 2012-11-11 ENCOUNTER — Encounter (HOSPITAL_COMMUNITY)
Admission: RE | Disposition: A | Discharge status: Home / Self Care | Payer: Self-pay | Source: Ambulatory Visit | Attending: Cardiothoracic Surgery

## 2012-11-11 ENCOUNTER — Inpatient Hospital Stay (HOSPITAL_COMMUNITY): Payer: MEDICARE | Admitting: Anesthesiology

## 2012-11-11 ENCOUNTER — Encounter (HOSPITAL_COMMUNITY): Payer: Self-pay | Admitting: Anesthesiology

## 2012-11-11 DIAGNOSIS — I2510 Atherosclerotic heart disease of native coronary artery without angina pectoris: Secondary | ICD-10-CM

## 2012-11-11 HISTORY — PX: CORONARY ARTERY BYPASS GRAFT: SHX141

## 2012-11-11 HISTORY — PX: INTRAOPERATIVE TRANSESOPHAGEAL ECHOCARDIOGRAM: SHX5062

## 2012-11-11 LAB — CBC
HCT: 28.7 % — ABNORMAL LOW (ref 39.0–52.0)
Hemoglobin: 11.1 g/dL — ABNORMAL LOW (ref 13.0–17.0)
Hemoglobin: 10 g/dL — ABNORMAL LOW (ref 13.0–17.0)
MCH: 34.2 pg — ABNORMAL HIGH (ref 26.0–34.0)
MCHC: 35.4 g/dL (ref 30.0–36.0)
MCHC: 34.8 g/dL (ref 30.0–36.0)
MCV: 99 fL (ref 78.0–100.0)
MCV: 98.3 fL (ref 78.0–100.0)
Platelets: 272 10*3/uL (ref 150–400)
Platelets: 167 10*3/uL (ref 150–400)
RBC: 3.88 MIL/uL — ABNORMAL LOW (ref 4.22–5.81)
RBC: 2.92 MIL/uL — ABNORMAL LOW (ref 4.22–5.81)
RDW: 12 % (ref 11.5–15.5)
RDW: 11.8 % (ref 11.5–15.5)
RDW: 11.9 % (ref 11.5–15.5)
WBC: 7.5 10*3/uL (ref 4.0–10.5)
WBC: 15.8 10*3/uL — ABNORMAL HIGH (ref 4.0–10.5)

## 2012-11-11 LAB — POCT I-STAT 4, (NA,K, GLUC, HGB,HCT)
Glucose, Bld: 105 mg/dL — ABNORMAL HIGH (ref 70–99)
Glucose, Bld: 102 mg/dL — ABNORMAL HIGH (ref 70–99)
Glucose, Bld: 104 mg/dL — ABNORMAL HIGH (ref 70–99)
HCT: 33 % — ABNORMAL LOW (ref 39.0–52.0)
HCT: 27 % — ABNORMAL LOW (ref 39.0–52.0)
HCT: 26 % — ABNORMAL LOW (ref 39.0–52.0)
HCT: 28 % — ABNORMAL LOW (ref 39.0–52.0)
Hemoglobin: 10.9 g/dL — ABNORMAL LOW (ref 13.0–17.0)
Hemoglobin: 9.2 g/dL — ABNORMAL LOW (ref 13.0–17.0)
Hemoglobin: 8.8 g/dL — ABNORMAL LOW (ref 13.0–17.0)
Hemoglobin: 9.5 g/dL — ABNORMAL LOW (ref 13.0–17.0)
Potassium: 3.9 mEq/L (ref 3.5–5.1)
Potassium: 3.6 mEq/L (ref 3.5–5.1)
Potassium: 3.6 mEq/L (ref 3.5–5.1)
Sodium: 139 mEq/L (ref 135–145)
Sodium: 139 mEq/L (ref 135–145)
Sodium: 139 mEq/L (ref 135–145)
Sodium: 141 mEq/L (ref 135–145)

## 2012-11-11 LAB — HEPARIN LEVEL (UNFRACTIONATED): Heparin Unfractionated: 0.54 IU/mL (ref 0.30–0.70)

## 2012-11-11 LAB — GLUCOSE, CAPILLARY
Glucose-Capillary: 106 mg/dL — ABNORMAL HIGH (ref 70–99)
Glucose-Capillary: 146 mg/dL — ABNORMAL HIGH (ref 70–99)
Glucose-Capillary: 56 mg/dL — ABNORMAL LOW (ref 70–99)
Glucose-Capillary: 147 mg/dL — ABNORMAL HIGH (ref 70–99)
Glucose-Capillary: 62 mg/dL — ABNORMAL LOW (ref 70–99)
Glucose-Capillary: 137 mg/dL — ABNORMAL HIGH (ref 70–99)

## 2012-11-11 LAB — POCT I-STAT 3, ART BLOOD GAS (G3+)
Acid-base deficit: 1 mmol/L (ref 0.0–2.0)
Acid-base deficit: 2 mmol/L (ref 0.0–2.0)
Bicarbonate: 22.9 mEq/L (ref 20.0–24.0)
Bicarbonate: 18.9 mEq/L — ABNORMAL LOW (ref 20.0–24.0)
O2 Saturation: 100 %
O2 Saturation: 100 %
O2 Saturation: 97 %
Patient temperature: 36.8
TCO2: 20 mmol/L (ref 0–100)
TCO2: 24 mmol/L (ref 0–100)
pCO2 arterial: 36 mmHg (ref 35.0–45.0)
pCO2 arterial: 38.9 mmHg (ref 35.0–45.0)
pH, Arterial: 7.293 — ABNORMAL LOW (ref 7.350–7.450)
pH, Arterial: 7.298 — ABNORMAL LOW (ref 7.350–7.450)
pO2, Arterial: 206 mmHg — ABNORMAL HIGH (ref 80.0–100.0)

## 2012-11-11 LAB — PROTIME-INR
INR: 1.45 (ref 0.00–1.49)
Prothrombin Time: 17.3 seconds — ABNORMAL HIGH (ref 11.6–15.2)

## 2012-11-11 LAB — HEMOGLOBIN AND HEMATOCRIT, BLOOD
HCT: 24.2 % — ABNORMAL LOW (ref 39.0–52.0)
Hemoglobin: 8.6 g/dL — ABNORMAL LOW (ref 13.0–17.0)

## 2012-11-11 LAB — POCT I-STAT, CHEM 8
Calcium, Ion: 1.16 mmol/L (ref 1.13–1.30)
HCT: 27 % — ABNORMAL LOW (ref 39.0–52.0)
TCO2: 21 mmol/L (ref 0–100)

## 2012-11-11 LAB — CREATININE, SERUM
Creatinine, Ser: 1.19 mg/dL (ref 0.50–1.35)
GFR calc Af Amer: 64 mL/min — ABNORMAL LOW (ref >90)
GFR calc non Af Amer: 55 mL/min — ABNORMAL LOW (ref >90)

## 2012-11-11 LAB — MAGNESIUM: Magnesium: 2.9 mg/dL — ABNORMAL HIGH (ref 1.5–2.5)

## 2012-11-11 LAB — PLATELET COUNT: Platelets: 156 10*3/uL (ref 150–400)

## 2012-11-11 SURGERY — CORONARY ARTERY BYPASS GRAFTING (CABG)
Anesthesia: General | Site: Chest | Wound class: Clean

## 2012-11-11 MED ORDER — PROPOFOL 10 MG/ML IV BOLUS
INTRAVENOUS | Status: DC | PRN
  Administered 2012-11-11: 200 mg via INTRAVENOUS

## 2012-11-11 MED ORDER — DOPAMINE-DEXTROSE 3.2-5 MG/ML-% IV SOLN: 0.0000 ug/kg/min | INTRAVENOUS | Status: DC

## 2012-11-11 MED ORDER — SODIUM CHLORIDE 0.9 % IJ SOLN
3.0000 mL | INTRAMUSCULAR | Status: DC | PRN
  Administered 2012-11-11: 20 mL via INTRAVENOUS
  Administered 2012-11-11: 10 mL via INTRAVENOUS

## 2012-11-11 MED ORDER — AMINOCAPROIC ACID 250 MG/ML IV SOLN
INTRAVENOUS | Status: DC | PRN
  Administered 2012-11-11: 5 g via INTRAVENOUS

## 2012-11-11 MED ORDER — GLYCOPYRROLATE 0.2 MG/ML IJ SOLN
INTRAMUSCULAR | Status: DC | PRN
  Administered 2012-11-11: 0.2 mg via INTRAVENOUS

## 2012-11-11 MED ORDER — DOCUSATE SODIUM 100 MG PO CAPS
200.0000 mg | ORAL_CAPSULE | Freq: Every day | ORAL | Status: DC
  Administered 2012-11-12 – 2012-11-17 (×5): 200 mg via ORAL
  Filled 2012-11-11: qty 2
  Filled 2012-11-11: qty 1
  Filled 2012-11-11: qty 2
  Filled 2012-11-11: qty 1
  Filled 2012-11-11 (×2): qty 2
  Filled 2012-11-11 (×2): qty 1

## 2012-11-11 MED ORDER — NITROGLYCERIN IN D5W 200-5 MCG/ML-% IV SOLN
INTRAVENOUS | Status: DC | PRN
  Administered 2012-11-11: 5 ug/min via INTRAVENOUS

## 2012-11-11 MED ORDER — DIAZEPAM 5 MG PO TABS
5.0000 mg | ORAL_TABLET | Freq: Once | ORAL | Status: AC
  Administered 2012-11-11: 5 mg via ORAL
  Filled 2012-11-11: qty 1

## 2012-11-11 MED ORDER — DEXMEDETOMIDINE HCL IN NACL 200 MCG/50ML IV SOLN
INTRAVENOUS | Status: DC | PRN
  Administered 2012-11-11: 0.3 ug/kg/h via INTRAVENOUS

## 2012-11-11 MED ORDER — MIDAZOLAM HCL 5 MG/5ML IJ SOLN
INTRAMUSCULAR | Status: DC | PRN
  Administered 2012-11-11 (×6): 2 mg via INTRAVENOUS

## 2012-11-11 MED ORDER — LACTATED RINGERS IV SOLN: 500.0000 mL | Freq: Once | INTRAVENOUS | Status: AC | PRN

## 2012-11-11 MED ORDER — SODIUM CHLORIDE 0.9 % IV SOLN
100.0000 [IU] | INTRAVENOUS | Status: DC | PRN
  Administered 2012-11-11: 1.4 [IU]/h via INTRAVENOUS

## 2012-11-11 MED ORDER — ALBUMIN HUMAN 5 % IV SOLN
INTRAVENOUS | Status: DC | PRN
  Administered 2012-11-11: 13:00:00 via INTRAVENOUS

## 2012-11-11 MED ORDER — LACTATED RINGERS IV SOLN: INTRAVENOUS | Status: DC

## 2012-11-11 MED ORDER — BISACODYL 10 MG RE SUPP: 10.0000 mg | Freq: Every day | RECTAL | Status: DC

## 2012-11-11 MED ORDER — MORPHINE SULFATE 2 MG/ML IJ SOLN: 1.0000 mg | INTRAMUSCULAR | Status: AC | PRN

## 2012-11-11 MED ORDER — DEXMEDETOMIDINE HCL IN NACL 200 MCG/50ML IV SOLN: 0.1000 ug/kg/h | INTRAVENOUS | Status: DC

## 2012-11-11 MED ORDER — FENTANYL CITRATE 0.05 MG/ML IJ SOLN: 50.0000 ug | INTRAMUSCULAR | Status: DC | PRN

## 2012-11-11 MED ORDER — LACTATED RINGERS IV SOLN
INTRAVENOUS | Status: DC | PRN
  Administered 2012-11-11 (×2): via INTRAVENOUS

## 2012-11-11 MED ORDER — SODIUM CHLORIDE 0.9 % IV SOLN: 250.0000 mL | INTRAVENOUS | Status: DC

## 2012-11-11 MED ORDER — PHENYLEPHRINE HCL 10 MG/ML IJ SOLN
0.0000 ug/min | INTRAVENOUS | Status: DC
  Administered 2012-11-12: 25.067 ug/min via INTRAVENOUS
  Filled 2012-11-11 (×5): qty 2

## 2012-11-11 MED ORDER — ARTIFICIAL TEARS OP OINT
TOPICAL_OINTMENT | OPHTHALMIC | Status: DC | PRN
  Administered 2012-11-11: 1 via OPHTHALMIC

## 2012-11-11 MED ORDER — FAMOTIDINE IN NACL 20-0.9 MG/50ML-% IV SOLN
20.0000 mg | Freq: Two times a day (BID) | INTRAVENOUS | Status: AC
  Administered 2012-11-11: 20 mg via INTRAVENOUS

## 2012-11-11 MED ORDER — PROTAMINE SULFATE 10 MG/ML IV SOLN
INTRAVENOUS | Status: DC | PRN
  Administered 2012-11-11: 20 mg via INTRAVENOUS
  Administered 2012-11-11: 80 mg via INTRAVENOUS
  Administered 2012-11-11 (×3): 20 mg via INTRAVENOUS
  Administered 2012-11-11: 50 mg via INTRAVENOUS
  Administered 2012-11-11: 20 mg via INTRAVENOUS
  Administered 2012-11-11: 40 mg via INTRAVENOUS

## 2012-11-11 MED ORDER — LIDOCAINE HCL (CARDIAC) 20 MG/ML IV SOLN
INTRAVENOUS | Status: DC | PRN
  Administered 2012-11-11: 60 mg via INTRAVENOUS

## 2012-11-11 MED ORDER — ACETAMINOPHEN 160 MG/5ML PO SOLN
1000.0000 mg | Freq: Four times a day (QID) | ORAL | Status: DC
  Administered 2012-11-12: 1000 mg
  Filled 2012-11-11: qty 40.6

## 2012-11-11 MED ORDER — DEXTROSE 50 % IV SOLN
INTRAVENOUS | Status: AC
  Administered 2012-11-11: 18 mL
  Filled 2012-11-11: qty 50

## 2012-11-11 MED ORDER — DEXMEDETOMIDINE HCL IN NACL 200 MCG/50ML IV SOLN
0.4000 ug/kg/h | INTRAVENOUS | Status: DC
  Filled 2012-11-11: qty 50

## 2012-11-11 MED ORDER — OXYCODONE HCL 5 MG PO TABS
5.0000 mg | ORAL_TABLET | ORAL | Status: DC | PRN
  Administered 2012-11-12: 10 mg via ORAL
  Filled 2012-11-11: qty 2

## 2012-11-11 MED ORDER — ACETAMINOPHEN 650 MG RE SUPP
650.0000 mg | Freq: Once | RECTAL | Status: AC
  Administered 2012-11-11: 650 mg via RECTAL

## 2012-11-11 MED ORDER — SODIUM CHLORIDE 0.9 % IV SOLN
INTRAVENOUS | Status: DC
  Filled 2012-11-11: qty 1

## 2012-11-11 MED ORDER — SODIUM BICARBONATE 8.4 % IV SOLN
50.0000 meq | Freq: Once | INTRAVENOUS | Status: AC
  Administered 2012-11-11: 50 meq via INTRAVENOUS
  Filled 2012-11-11: qty 50

## 2012-11-11 MED ORDER — MAGNESIUM SULFATE 40 MG/ML IJ SOLN
4.0000 g | Freq: Once | INTRAMUSCULAR | Status: AC
  Administered 2012-11-11: 4 g via INTRAVENOUS

## 2012-11-11 MED ORDER — POTASSIUM CHLORIDE 10 MEQ/50ML IV SOLN
10.0000 meq | INTRAVENOUS | Status: AC
  Administered 2012-11-11 (×3): 10 meq via INTRAVENOUS

## 2012-11-11 MED ORDER — SODIUM CHLORIDE 0.9 % IV SOLN
0.5000 g/h | INTRAVENOUS | Status: DC
  Filled 2012-11-11: qty 20

## 2012-11-11 MED ORDER — ONDANSETRON HCL 4 MG/2ML IJ SOLN
4.0000 mg | Freq: Four times a day (QID) | INTRAMUSCULAR | Status: DC | PRN
  Filled 2012-11-11: qty 2

## 2012-11-11 MED ORDER — SODIUM CHLORIDE 0.9 % IV SOLN: INTRAVENOUS | Status: DC

## 2012-11-11 MED ORDER — DEXTROSE 50 % IV SOLN
25.0000 mL | Freq: Once | INTRAVENOUS | Status: AC
  Administered 2012-11-11: 15 mL via INTRAVENOUS

## 2012-11-11 MED ORDER — METOPROLOL TARTRATE 25 MG/10 ML ORAL SUSPENSION
12.5000 mg | Freq: Two times a day (BID) | ORAL | Status: DC
  Filled 2012-11-11 (×9): qty 5

## 2012-11-11 MED ORDER — MIDAZOLAM HCL 2 MG/2ML IJ SOLN: 1.0000 mg | INTRAMUSCULAR | Status: DC | PRN

## 2012-11-11 MED ORDER — VANCOMYCIN HCL IN DEXTROSE 1-5 GM/200ML-% IV SOLN
1000.0000 mg | Freq: Once | INTRAVENOUS | Status: AC
  Administered 2012-11-11: 1000 mg via INTRAVENOUS
  Filled 2012-11-11: qty 200

## 2012-11-11 MED ORDER — ROCURONIUM BROMIDE 100 MG/10ML IV SOLN
INTRAVENOUS | Status: DC | PRN
  Administered 2012-11-11 (×3): 20 mg via INTRAVENOUS
  Administered 2012-11-11: 30 mg via INTRAVENOUS
  Administered 2012-11-11: 50 mg via INTRAVENOUS
  Administered 2012-11-11: 10 mg via INTRAVENOUS

## 2012-11-11 MED ORDER — ACETAMINOPHEN 160 MG/5ML PO SOLN: 650.0000 mg | Freq: Once | ORAL | Status: AC

## 2012-11-11 MED ORDER — METOPROLOL TARTRATE 1 MG/ML IV SOLN: 2.5000 mg | INTRAVENOUS | Status: DC | PRN

## 2012-11-11 MED ORDER — MIDAZOLAM HCL 2 MG/2ML IJ SOLN: 2.0000 mg | INTRAMUSCULAR | Status: DC | PRN

## 2012-11-11 MED ORDER — ASPIRIN 81 MG PO CHEW: 324.0000 mg | CHEWABLE_TABLET | Freq: Every day | ORAL | Status: DC

## 2012-11-11 MED ORDER — ALBUMIN HUMAN 5 % IV SOLN
250.0000 mL | INTRAVENOUS | Status: AC | PRN
  Administered 2012-11-11 (×3): 250 mL via INTRAVENOUS
  Filled 2012-11-11: qty 250

## 2012-11-11 MED ORDER — DEXTROSE 5 % IV SOLN
1.5000 g | Freq: Two times a day (BID) | INTRAVENOUS | Status: AC
  Administered 2012-11-11 – 2012-11-13 (×4): 1.5 g via INTRAVENOUS
  Filled 2012-11-11 (×4): qty 1.5

## 2012-11-11 MED ORDER — 0.9 % SODIUM CHLORIDE (POUR BTL) OPTIME
TOPICAL | Status: DC | PRN
  Administered 2012-11-11: 7000 mL

## 2012-11-11 MED ORDER — SODIUM CHLORIDE 0.9 % IJ SOLN
OROMUCOSAL | Status: DC | PRN
  Administered 2012-11-11 (×3): via TOPICAL

## 2012-11-11 MED ORDER — AMINOCAPROIC ACID 250 MG/ML IV SOLN: INTRAVENOUS | Status: DC | PRN

## 2012-11-11 MED ORDER — ALLOPURINOL 100 MG PO TABS
100.0000 mg | ORAL_TABLET | Freq: Every day | ORAL | Status: DC
  Administered 2012-11-12 – 2012-11-17 (×6): 100 mg via ORAL
  Filled 2012-11-11 (×6): qty 1

## 2012-11-11 MED ORDER — INSULIN REGULAR BOLUS VIA INFUSION
0.0000 [IU] | Freq: Three times a day (TID) | INTRAVENOUS | Status: DC
  Filled 2012-11-11: qty 10

## 2012-11-11 MED ORDER — METOPROLOL TARTRATE 12.5 MG HALF TABLET
12.5000 mg | ORAL_TABLET | Freq: Two times a day (BID) | ORAL | Status: DC
  Filled 2012-11-11 (×9): qty 1

## 2012-11-11 MED ORDER — COLCHICINE 0.6 MG PO TABS
0.6000 mg | ORAL_TABLET | Freq: Every day | ORAL | Status: DC
  Administered 2012-11-12 – 2012-11-17 (×6): 0.6 mg via ORAL
  Filled 2012-11-11 (×6): qty 1

## 2012-11-11 MED ORDER — ACETAMINOPHEN 500 MG PO TABS
1000.0000 mg | ORAL_TABLET | Freq: Four times a day (QID) | ORAL | Status: DC
  Administered 2012-11-12 (×2): 1000 mg via ORAL
  Filled 2012-11-11 (×4): qty 2

## 2012-11-11 MED ORDER — MAGNESIUM SULFATE 40 MG/ML IJ SOLN
INTRAMUSCULAR | Status: AC
  Filled 2012-11-11: qty 100

## 2012-11-11 MED ORDER — PANTOPRAZOLE SODIUM 40 MG PO TBEC
40.0000 mg | DELAYED_RELEASE_TABLET | Freq: Every day | ORAL | Status: DC
  Administered 2012-11-13 – 2012-11-17 (×5): 40 mg via ORAL
  Filled 2012-11-11 (×5): qty 1

## 2012-11-11 MED ORDER — HEPARIN SODIUM (PORCINE) 1000 UNIT/ML IJ SOLN
INTRAMUSCULAR | Status: DC | PRN
  Administered 2012-11-11: 5000 [IU] via INTRAVENOUS
  Administered 2012-11-11: 2000 [IU] via INTRAVENOUS
  Administered 2012-11-11: 18000 [IU] via INTRAVENOUS

## 2012-11-11 MED ORDER — ASPIRIN EC 325 MG PO TBEC
325.0000 mg | DELAYED_RELEASE_TABLET | Freq: Every day | ORAL | Status: DC
  Administered 2012-11-12 – 2012-11-15 (×4): 325 mg via ORAL
  Filled 2012-11-11 (×4): qty 1

## 2012-11-11 MED ORDER — PHENYLEPHRINE HCL 10 MG/ML IJ SOLN
10.0000 mg | INTRAVENOUS | Status: DC | PRN
  Administered 2012-11-11: 15 ug/min via INTRAVENOUS

## 2012-11-11 MED ORDER — SODIUM CHLORIDE 0.9 % IJ SOLN
3.0000 mL | Freq: Two times a day (BID) | INTRAMUSCULAR | Status: DC
  Administered 2012-11-12 – 2012-11-17 (×8): 3 mL via INTRAVENOUS

## 2012-11-11 MED ORDER — SODIUM CHLORIDE 0.9 % IV SOLN
10.0000 g | INTRAVENOUS | Status: DC | PRN
  Administered 2012-11-11: 1 g/h via INTRAVENOUS

## 2012-11-11 MED ORDER — MORPHINE SULFATE 2 MG/ML IJ SOLN: 2.0000 mg | INTRAMUSCULAR | Status: DC | PRN

## 2012-11-11 MED ORDER — DOPAMINE-DEXTROSE 3.2-5 MG/ML-% IV SOLN
INTRAVENOUS | Status: DC | PRN
  Administered 2012-11-11: 3 ug/kg/min via INTRAVENOUS

## 2012-11-11 MED ORDER — PLASMA-LYTE 148 IV SOLN
INTRAVENOUS | Status: DC | PRN
  Administered 2012-11-11: 08:00:00

## 2012-11-11 MED ORDER — HEMOSTATIC AGENTS (NO CHARGE) OPTIME
TOPICAL | Status: DC | PRN
  Administered 2012-11-11: 1 via TOPICAL

## 2012-11-11 MED ORDER — METOCLOPRAMIDE HCL 5 MG/ML IJ SOLN
10.0000 mg | Freq: Four times a day (QID) | INTRAMUSCULAR | Status: AC
  Administered 2012-11-11 – 2012-11-12 (×4): 10 mg via INTRAVENOUS
  Filled 2012-11-11 (×4): qty 2

## 2012-11-11 MED ORDER — FENTANYL CITRATE 0.05 MG/ML IJ SOLN
INTRAMUSCULAR | Status: DC | PRN
  Administered 2012-11-11: 150 ug via INTRAVENOUS
  Administered 2012-11-11: 200 ug via INTRAVENOUS
  Administered 2012-11-11: 150 ug via INTRAVENOUS
  Administered 2012-11-11: 100 ug via INTRAVENOUS
  Administered 2012-11-11: 50 ug via INTRAVENOUS
  Administered 2012-11-11 (×2): 100 ug via INTRAVENOUS
  Administered 2012-11-11: 150 ug via INTRAVENOUS
  Administered 2012-11-11: 250 ug via INTRAVENOUS

## 2012-11-11 MED ORDER — SODIUM CHLORIDE 0.45 % IV SOLN: INTRAVENOUS | Status: DC

## 2012-11-11 MED ORDER — BISACODYL 5 MG PO TBEC
10.0000 mg | DELAYED_RELEASE_TABLET | Freq: Every day | ORAL | Status: DC
  Administered 2012-11-12 – 2012-11-17 (×6): 10 mg via ORAL
  Filled 2012-11-11: qty 1
  Filled 2012-11-11: qty 2
  Filled 2012-11-11: qty 1
  Filled 2012-11-11: qty 2
  Filled 2012-11-11: qty 1
  Filled 2012-11-11 (×2): qty 2
  Filled 2012-11-11: qty 1

## 2012-11-11 MED ORDER — NITROGLYCERIN IN D5W 200-5 MCG/ML-% IV SOLN: 0.0000 ug/min | INTRAVENOUS | Status: DC

## 2012-11-11 MED FILL — Sodium Bicarbonate IV Soln 8.4%: INTRAVENOUS | Qty: 50 | Status: AC

## 2012-11-11 MED FILL — Heparin Sodium (Porcine) Inj 1000 Unit/ML: INTRAMUSCULAR | Qty: 10 | Status: AC

## 2012-11-11 MED FILL — Mannitol IV Soln 20%: INTRAVENOUS | Qty: 500 | Status: AC

## 2012-11-11 MED FILL — Lidocaine HCl IV Inj 20 MG/ML: INTRAVENOUS | Qty: 5 | Status: AC

## 2012-11-11 MED FILL — Electrolyte-R (PH 7.4) Solution: INTRAVENOUS | Qty: 5000 | Status: AC

## 2012-11-11 MED FILL — Sodium Chloride Irrigation Soln 0.9%: Qty: 3000 | Status: AC

## 2012-11-11 SURGICAL SUPPLY — 110 items
ADAPTER CARDIO PERF ANTE/RETRO (ADAPTER) ×3 IMPLANT
ADPR PRFSN 84XANTGRD RTRGD (ADAPTER) ×1
ATTRACTOMAT 16X20 MAGNETIC DRP (DRAPES) ×3 IMPLANT
BAG DECANTER FOR FLEXI CONT (MISCELLANEOUS) ×3 IMPLANT
BANDAGE ELASTIC 4 VELCRO ST LF (GAUZE/BANDAGES/DRESSINGS) ×3 IMPLANT
BANDAGE ELASTIC 6 VELCRO ST LF (GAUZE/BANDAGES/DRESSINGS) ×3 IMPLANT
BANDAGE GAUZE ELAST BULKY 4 IN (GAUZE/BANDAGES/DRESSINGS) ×3 IMPLANT
BASKET HEART  (ORDER IN 25'S) (MISCELLANEOUS) ×1
BASKET HEART (ORDER IN 25'S) (MISCELLANEOUS) ×1
BASKET HEART (ORDER IN 25S) (MISCELLANEOUS) ×1 IMPLANT
BLADE STERNUM SYSTEM 6 (BLADE) ×3 IMPLANT
BLADE SURG 12 STRL SS (BLADE) ×3 IMPLANT
BLADE SURG ROTATE 9660 (MISCELLANEOUS) ×2 IMPLANT
CANISTER SUCTION 2500CC (MISCELLANEOUS) ×3 IMPLANT
CANNULA AORTIC HI-FLOW 6.5M20F (CANNULA) ×3 IMPLANT
CANNULA ARTERIAL NVNT 3/8 20FR (MISCELLANEOUS) ×3 IMPLANT
CANNULA GUNDRY RCSP 15FR (MISCELLANEOUS) ×3 IMPLANT
CANNULA VENOUS LOW PROF 32X40 (CANNULA) ×3 IMPLANT
CANNULA VENOUS MAL SGL STG 40 (MISCELLANEOUS) IMPLANT
CANNULAE VENOUS MAL SGL STG 40 (MISCELLANEOUS)
CATH CPB KIT VANTRIGT (MISCELLANEOUS) ×3 IMPLANT
CATH ROBINSON RED A/P 18FR (CATHETERS) ×9 IMPLANT
CATH THORACIC 28FR (CATHETERS) IMPLANT
CATH THORACIC 28FR RT ANG (CATHETERS) IMPLANT
CATH THORACIC 36FR (CATHETERS) IMPLANT
CATH THORACIC 36FR RT ANG (CATHETERS) ×6 IMPLANT
CLIP FOGARTY SPRING 6M (CLIP) ×2 IMPLANT
CLIP TI WIDE RED SMALL 24 (CLIP) ×2 IMPLANT
CLOTH BEACON ORANGE TIMEOUT ST (SAFETY) ×3 IMPLANT
COVER SURGICAL LIGHT HANDLE (MISCELLANEOUS) ×3 IMPLANT
CRADLE DONUT ADULT HEAD (MISCELLANEOUS) ×3 IMPLANT
DRAIN CHANNEL 32F RND 10.7 FF (WOUND CARE) ×3 IMPLANT
DRAPE CARDIOVASCULAR INCISE (DRAPES) ×3
DRAPE SLUSH/WARMER DISC (DRAPES) ×3 IMPLANT
DRAPE SRG 135X102X78XABS (DRAPES) ×1 IMPLANT
DRSG AQUACEL AG ADV 3.5X10 (GAUZE/BANDAGES/DRESSINGS) ×2 IMPLANT
DRSG COVADERM 4X14 (GAUZE/BANDAGES/DRESSINGS) ×1 IMPLANT
ELECT BLADE 4.0 EZ CLEAN MEGAD (MISCELLANEOUS) ×3
ELECT BLADE 6.5 EXT (BLADE) ×3 IMPLANT
ELECT CAUTERY BLADE 6.4 (BLADE) ×3 IMPLANT
ELECT REM PT RETURN 9FT ADLT (ELECTROSURGICAL) ×6
ELECTRODE BLDE 4.0 EZ CLN MEGD (MISCELLANEOUS) ×1 IMPLANT
ELECTRODE REM PT RTRN 9FT ADLT (ELECTROSURGICAL) ×2 IMPLANT
GLOVE BIO SURGEON STRL SZ 6 (GLOVE) ×4 IMPLANT
GLOVE BIO SURGEON STRL SZ 6.5 (GLOVE) ×6 IMPLANT
GLOVE BIO SURGEON STRL SZ7.5 (GLOVE) ×8 IMPLANT
GLOVE BIO SURGEONS STRL SZ 6.5 (GLOVE) ×6
GLOVE BIOGEL PI IND STRL 6.5 (GLOVE) IMPLANT
GLOVE BIOGEL PI INDICATOR 6.5 (GLOVE) ×10
GOWN STRL NON-REIN LRG LVL3 (GOWN DISPOSABLE) ×22 IMPLANT
HEMOSTAT POWDER SURGIFOAM 1G (HEMOSTASIS) ×9 IMPLANT
HEMOSTAT SURGICEL 2X14 (HEMOSTASIS) ×3 IMPLANT
INSERT FOGARTY XLG (MISCELLANEOUS) IMPLANT
KIT BASIN OR (CUSTOM PROCEDURE TRAY) ×3 IMPLANT
KIT ROOM TURNOVER OR (KITS) ×3 IMPLANT
KIT SUCTION CATH 14FR (SUCTIONS) ×3 IMPLANT
KIT VASOVIEW W/TROCAR VH 2000 (KITS) ×3 IMPLANT
LEAD PACING MYOCARDI (MISCELLANEOUS) ×5 IMPLANT
MARKER GRAFT CORONARY BYPASS (MISCELLANEOUS) ×9 IMPLANT
NS IRRIG 1000ML POUR BTL (IV SOLUTION) ×19 IMPLANT
PACK OPEN HEART (CUSTOM PROCEDURE TRAY) ×3 IMPLANT
PAD ARMBOARD 7.5X6 YLW CONV (MISCELLANEOUS) ×6 IMPLANT
PAD ELECT DEFIB RADIOL ZOLL (MISCELLANEOUS) ×3 IMPLANT
PENCIL BUTTON HOLSTER BLD 10FT (ELECTRODE) ×3 IMPLANT
PUNCH AORTIC ROTATE 4.0MM (MISCELLANEOUS) IMPLANT
PUNCH AORTIC ROTATE 4.5MM 8IN (MISCELLANEOUS) ×2 IMPLANT
PUNCH AORTIC ROTATE 5MM 8IN (MISCELLANEOUS) IMPLANT
SET CARDIOPLEGIA MPS 5001102 (MISCELLANEOUS) ×2 IMPLANT
SPONGE GAUZE 4X4 12PLY (GAUZE/BANDAGES/DRESSINGS) ×8 IMPLANT
SPONGE LAP 18X18 X RAY DECT (DISPOSABLE) ×2 IMPLANT
SURGIFLO TRUKIT (HEMOSTASIS) ×2 IMPLANT
SUT BONE WAX W31G (SUTURE) ×3 IMPLANT
SUT MNCRL AB 4-0 PS2 18 (SUTURE) ×2 IMPLANT
SUT PROLENE 3 0 SH DA (SUTURE) IMPLANT
SUT PROLENE 3 0 SH1 36 (SUTURE) IMPLANT
SUT PROLENE 4 0 RB 1 (SUTURE) ×3
SUT PROLENE 4 0 SH DA (SUTURE) ×3 IMPLANT
SUT PROLENE 4-0 RB1 .5 CRCL 36 (SUTURE) ×1 IMPLANT
SUT PROLENE 5 0 C 1 36 (SUTURE) IMPLANT
SUT PROLENE 6 0 C 1 30 (SUTURE) IMPLANT
SUT PROLENE 6 0 CC (SUTURE) ×6 IMPLANT
SUT PROLENE 7 0 DA (SUTURE) IMPLANT
SUT PROLENE 7.0 RB 3 (SUTURE) ×11 IMPLANT
SUT PROLENE 8 0 BV175 6 (SUTURE) ×2 IMPLANT
SUT PROLENE BLUE 7 0 (SUTURE) ×9 IMPLANT
SUT SILK  1 MH (SUTURE)
SUT SILK 1 MH (SUTURE) IMPLANT
SUT SILK 2 0 SH CR/8 (SUTURE) IMPLANT
SUT SILK 3 0 SH CR/8 (SUTURE) IMPLANT
SUT STEEL 6MS V (SUTURE) ×6 IMPLANT
SUT STEEL SZ 6 DBL 3X14 BALL (SUTURE) ×3 IMPLANT
SUT VIC AB 1 CTX 36 (SUTURE) ×6
SUT VIC AB 1 CTX36XBRD ANBCTR (SUTURE) ×2 IMPLANT
SUT VIC AB 2-0 CT1 27 (SUTURE) ×3
SUT VIC AB 2-0 CT1 TAPERPNT 27 (SUTURE) IMPLANT
SUT VIC AB 2-0 CTX 27 (SUTURE) IMPLANT
SUT VIC AB 3-0 SH 27 (SUTURE)
SUT VIC AB 3-0 SH 27X BRD (SUTURE) IMPLANT
SUT VIC AB 3-0 X1 27 (SUTURE) IMPLANT
SUT VICRYL 4-0 PS2 18IN ABS (SUTURE) IMPLANT
SUTURE E-PAK OPEN HEART (SUTURE) ×3 IMPLANT
SYSTEM SAHARA CHEST DRAIN ATS (WOUND CARE) ×3 IMPLANT
TAPE CLOTH SURG 4X10 WHT LF (GAUZE/BANDAGES/DRESSINGS) ×2 IMPLANT
TOWEL OR 17X24 6PK STRL BLUE (TOWEL DISPOSABLE) ×8 IMPLANT
TOWEL OR 17X26 10 PK STRL BLUE (TOWEL DISPOSABLE) ×6 IMPLANT
TRAY FOLEY IC TEMP SENS 14FR (CATHETERS) ×3 IMPLANT
TUBE SUCT INTRACARD DLP 20F (MISCELLANEOUS) ×3 IMPLANT
TUBING INSUFFLATION 10FT LAP (TUBING) ×3 IMPLANT
UNDERPAD 30X30 INCONTINENT (UNDERPADS AND DIAPERS) ×3 IMPLANT
WATER STERILE IRR 1000ML POUR (IV SOLUTION) ×6 IMPLANT

## 2012-11-11 NOTE — OR Nursing (Signed)
First call made to 2300 at 1240; spoke with Jamesetta So RN, Family also notified that patient was off bypass; spoke with volunteer Randa Evens.

## 2012-11-11 NOTE — Anesthesia Procedure Notes (Signed)
Procedure Name: Intubation Date/Time: 11/11/2012 9:02 AM Performed by: Ellin Goodie Pre-anesthesia Checklist: Patient identified, Emergency Drugs available, Suction available, Patient being monitored and Timeout performed Patient Re-evaluated:Patient Re-evaluated prior to inductionOxygen Delivery Method: Circle system utilized Preoxygenation: Pre-oxygenation with 100% oxygen Intubation Type: IV induction Ventilation: Mask ventilation without difficulty Laryngoscope Size: Mac and 4 Grade View: Grade IV Tube type: Oral Tube size: 8.5 mm Number of attempts: 2 Airway Equipment and Method: Stylet Placement Confirmation: ETT inserted through vocal cords under direct vision,  positive ETCO2 and breath sounds checked- equal and bilateral Secured at: 23 cm Tube secured with: Tape Dental Injury: Teeth and Oropharynx as per pre-operative assessment  Difficulty Due To: Difficult Airway- due to anterior larynx and Difficult Airway- due to immobile epiglottis Comments: Atraumatic induction and intubation with MAC 4 blade, attempted DL x 1 with MAC 3 blade with poor view.  Repositioned patient and DL x 1 with MAC 4 blade on second attempt.  Dr. Chaney Malling verified placement of ETT.  Carlynn Herald, CRNA

## 2012-11-11 NOTE — Progress Notes (Signed)
S/p CABG x 4  Intubated, but following commands On pressure support wean currently  BP 117/50  Pulse 80  Temp(Src) 97.7 F (36.5 C) (Oral)  Resp 15  Ht 6' (1.829 m)  Wt 175 lb (79.379 kg)  BMI 23.73 kg/m2  SpO2 99%  CI= 2.09   Intake/Output Summary (Last 24 hours) at 11/11/12 1751 Last data filed at 11/11/12 1725  Gross per 24 hour  Intake 3807.9 ml  Output   4260 ml  Net -452.1 ml    Doing well early postop

## 2012-11-11 NOTE — Anesthesia Preprocedure Evaluation (Addendum)
Anesthesia Evaluation  Patient identified by MRN, date of birth, ID band Patient awake    Reviewed: Allergy & Precautions, H&P , NPO status , Patient's Chart, lab work & pertinent test results, reviewed documented beta blocker date and time   Airway Mallampati: II  Neck ROM: full    Dental  (+) Teeth Intact, Dental Advisory Given and Partial Upper   Pulmonary shortness of breath, sleep apnea ,  breath sounds clear to auscultation        Cardiovascular hypertension, Pt. on medications and Pt. on home beta blockers + angina with exertion + CAD and + Peripheral Vascular Disease     Neuro/Psych    GI/Hepatic GERD-  Controlled,  Endo/Other    Renal/GU Renal InsufficiencyRenal disease     Musculoskeletal   Abdominal (+)  Abdomen: soft. Bowel sounds: normal.  Peds  Hematology  (+) Blood dyscrasia, ,   Anesthesia Other Findings   Reproductive/Obstetrics                        Anesthesia Physical Anesthesia Plan  ASA: III  Anesthesia Plan: General   Post-op Pain Management:    Induction: Intravenous  Airway Management Planned: Oral ETT  Additional Equipment: Arterial line, CVP, PA Cath and TEE  Intra-op Plan:   Post-operative Plan: Post-operative intubation/ventilation  Informed Consent: I have reviewed the patients History and Physical, chart, labs and discussed the procedure including the risks, benefits and alternatives for the proposed anesthesia with the patient or authorized representative who has indicated his/her understanding and acceptance.     Plan Discussed with: CRNA, Anesthesiologist and Surgeon  Anesthesia Plan Comments:         Anesthesia Quick Evaluation

## 2012-11-11 NOTE — OR Nursing (Signed)
Rectal Probe inserted without difficulty by Silvestre Gunner RN prior to surgical prep.

## 2012-11-11 NOTE — Brief Op Note (Signed)
11/07/2012 - 11/11/2012  11:47 AM  PATIENT:  Jon Reid  79 y.o. male  PRE-OPERATIVE DIAGNOSIS:  Coronary Artery Disease  POST-OPERATIVE DIAGNOSIS:  Coronary Artery Disease  PROCEDURE:INTRAOPERATIVE TRANSESOPHAGEAL ECHOCARDIOGRAM, MEDIAN STERNOTOMY for  CORONARY ARTERY BYPASS GRAFTING (CABG) x 4 (LIMA to LAD, SVG to OM, SVG SEQUENTIALLY to PDA and PLB) with EVH from the right thigh and lower leg  SURGEON:  Surgeon(s) and Role:    * Kerin Perna, MD - Primary  PHYSICIAN ASSISTANT: Doree Fudge PA-C  ANESTHESIA:   general  EBL:  Total I/O In: 1000 [I.V.:1000] Out: 1325 [Urine:1325]  DRAINS: Chest Tube(s) in the mediastinal and pleural spaces  COUNTS CORRECT:  YES  TOURNIQUET:  * No tourniquets in log *  DICTATION: .Dragon Dictation  PLAN OF CARE: Admit to inpatient   PATIENT DISPOSITION:  ICU - intubated and hemodynamically stable.   Delay start of Pharmacological VTE agent (>24hrs) due to surgical blood loss or risk of bleeding: yes  PRE OP WEIGHT: 79 kg

## 2012-11-11 NOTE — Procedures (Signed)
Extubation Procedure Note  Patient Details:   Name: Jon Reid DOB: [birth year] MRN: [id number]   Airway Documentation:   Patient weaned from vent support. Extubated and wearing nasal cannula with O2 running at 4lpm. VC 980cc's and NIF 30cm.  Evaluation  O2 sats: stable throughout Complications: No apparent complications Patient did tolerate procedure well. Bilateral Breath Sounds: Clear   Yes  Clearance Coots 11/11/2012, 7:00 PM

## 2012-11-11 NOTE — Progress Notes (Signed)
  Echocardiogram Echocardiogram Transesophageal has been performed.  Jon Reid 11/11/2012, 8:43 AM

## 2012-11-11 NOTE — Transfer of Care (Signed)
Immediate Anesthesia Transfer of Care Note  Patient: Jon Reid  Procedure(s) Performed: Procedure(s): CORONARY ARTERY BYPASS GRAFTING times four using Right Greater Saphenous Vein Graft harvested endoscopically and Left Internal Mammary Artery. (N/A) INTRAOPERATIVE TRANSESOPHAGEAL ECHOCARDIOGRAM (N/A)  Patient Location: SICU  Anesthesia Type:General  Level of Consciousness: Patient remains intubated per anesthesia plan  Airway & Oxygen Therapy: Patient remains intubated per anesthesia plan  Post-op Assessment: Post -op Vital signs reviewed and stable  Post vital signs: stable  Complications: No apparent anesthesia complications

## 2012-11-11 NOTE — Progress Notes (Signed)
Hypoglycemic Event  CBG: 56  Treatment: D50 IV 25 mL  Symptoms: None  Follow-up CBG: Time:1805 CBG Result:146  Possible Reasons for Event: Other: Insulin drip  Comments/MD notified:Jon Reid     Jon Reid  Remember to initiate Hypoglycemia Order Set & complete

## 2012-11-11 NOTE — OR Nursing (Signed)
Second call made to 2300; spoke with Lynnea Ferrier.

## 2012-11-11 NOTE — Progress Notes (Signed)
The patient was examined and preop studies reviewed. There has been no change from the prior exam and the patient is ready for surgery.   Plan CABG on J Perfecto this am

## 2012-11-12 ENCOUNTER — Encounter (HOSPITAL_COMMUNITY): Payer: Self-pay | Admitting: Cardiothoracic Surgery

## 2012-11-12 ENCOUNTER — Inpatient Hospital Stay (HOSPITAL_COMMUNITY): Payer: MEDICARE

## 2012-11-12 LAB — GLUCOSE, CAPILLARY
Glucose-Capillary: 125 mg/dL — ABNORMAL HIGH (ref 70–99)
Glucose-Capillary: 175 mg/dL — ABNORMAL HIGH (ref 70–99)
Glucose-Capillary: 111 mg/dL — ABNORMAL HIGH (ref 70–99)

## 2012-11-12 LAB — CBC
HCT: 26.6 % — ABNORMAL LOW (ref 39.0–52.0)
Hemoglobin: 9.1 g/dL — ABNORMAL LOW (ref 13.0–17.0)
MCH: 34.2 pg — ABNORMAL HIGH (ref 26.0–34.0)
MCHC: 34.5 g/dL (ref 30.0–36.0)
MCV: 98.2 fL (ref 78.0–100.0)
RBC: 2.71 MIL/uL — ABNORMAL LOW (ref 4.22–5.81)
WBC: 14.4 10*3/uL — ABNORMAL HIGH (ref 4.0–10.5)

## 2012-11-12 LAB — BASIC METABOLIC PANEL
BUN: 18 mg/dL (ref 6–23)
CO2: 22 mEq/L (ref 19–32)
Chloride: 106 mEq/L (ref 96–112)
Creatinine, Ser: 1.34 mg/dL (ref 0.50–1.35)

## 2012-11-12 LAB — POCT I-STAT, CHEM 8
BUN: 22 mg/dL (ref 6–23)
Calcium, Ion: 1.19 mmol/L (ref 1.13–1.30)
Chloride: 102 mEq/L (ref 96–112)
Glucose, Bld: 141 mg/dL — ABNORMAL HIGH (ref 70–99)
HCT: 26 % — ABNORMAL LOW (ref 39.0–52.0)
Potassium: 4.1 mEq/L (ref 3.5–5.1)

## 2012-11-12 LAB — MAGNESIUM: Magnesium: 2.5 mg/dL (ref 1.5–2.5)

## 2012-11-12 LAB — CREATININE, SERUM: Creatinine, Ser: 1.5 mg/dL — ABNORMAL HIGH (ref 0.50–1.35)

## 2012-11-12 MED ORDER — INSULIN ASPART 100 UNIT/ML ~~LOC~~ SOLN
0.0000 [IU] | SUBCUTANEOUS | Status: DC
  Administered 2012-11-12 (×2): 2 [IU] via SUBCUTANEOUS

## 2012-11-12 MED ORDER — INSULIN ASPART 100 UNIT/ML ~~LOC~~ SOLN: 0.0000 [IU] | SUBCUTANEOUS | Status: DC

## 2012-11-12 MED ORDER — ACETAMINOPHEN 500 MG PO TABS
1000.0000 mg | ORAL_TABLET | Freq: Four times a day (QID) | ORAL | Status: DC
  Administered 2012-11-12 – 2012-11-17 (×18): 1000 mg via ORAL
  Filled 2012-11-12 (×20): qty 2

## 2012-11-12 MED ORDER — INSULIN ASPART 100 UNIT/ML ~~LOC~~ SOLN
0.0000 [IU] | SUBCUTANEOUS | Status: DC
  Administered 2012-11-12 (×2): 2 [IU] via SUBCUTANEOUS
  Administered 2012-11-12: 4 [IU] via SUBCUTANEOUS
  Administered 2012-11-13 – 2012-11-14 (×3): 2 [IU] via SUBCUTANEOUS

## 2012-11-12 MED ORDER — DOPAMINE-DEXTROSE 3.2-5 MG/ML-% IV SOLN
0.0000 ug/kg/min | INTRAVENOUS | Status: DC
  Filled 2012-11-12: qty 250

## 2012-11-12 MED ORDER — INSULIN ASPART 100 UNIT/ML ~~LOC~~ SOLN
3.0000 [IU] | Freq: Three times a day (TID) | SUBCUTANEOUS | Status: DC
  Administered 2012-11-12: 3 [IU] via SUBCUTANEOUS

## 2012-11-12 MED ORDER — ALBUMIN HUMAN 5 % IV SOLN
INTRAVENOUS | Status: AC
  Administered 2012-11-12: 12.5 g
  Filled 2012-11-12: qty 250

## 2012-11-12 MED ORDER — VANCOMYCIN HCL IN DEXTROSE 1-5 GM/200ML-% IV SOLN
1000.0000 mg | Freq: Two times a day (BID) | INTRAVENOUS | Status: DC
  Administered 2012-11-12 – 2012-11-13 (×2): 1000 mg via INTRAVENOUS
  Filled 2012-11-12 (×2): qty 200

## 2012-11-12 MED ORDER — TRAMADOL HCL 50 MG PO TABS
50.0000 mg | ORAL_TABLET | Freq: Four times a day (QID) | ORAL | Status: DC | PRN
  Administered 2012-11-12: 50 mg via ORAL
  Filled 2012-11-12: qty 1

## 2012-11-12 MED ORDER — ACETAMINOPHEN 160 MG/5ML PO SOLN: 1000.0000 mg | Freq: Four times a day (QID) | ORAL | Status: DC

## 2012-11-12 MED FILL — Potassium Chloride Inj 2 mEq/ML: INTRAVENOUS | Qty: 40 | Status: AC

## 2012-11-12 MED FILL — Heparin Sodium (Porcine) Inj 1000 Unit/ML: INTRAMUSCULAR | Qty: 30 | Status: AC

## 2012-11-12 MED FILL — Sodium Chloride IV Soln 0.9%: INTRAVENOUS | Qty: 1000 | Status: AC

## 2012-11-12 MED FILL — Magnesium Sulfate Inj 50%: INTRAMUSCULAR | Qty: 10 | Status: AC

## 2012-11-12 MED FILL — Dexmedetomidine HCl IV Soln 200 MCG/2ML: INTRAVENOUS | Qty: 2 | Status: AC

## 2012-11-12 NOTE — Op Note (Signed)
NAMETRACE, WIRICK NO.:  [id number]  MEDICAL RECORD NO.:  [id number]  LOCATION:  2S09C                        FACILITY:  [facility]  PHYSICIAN:  Kerin Perna, M.D.  DATE OF BIRTH:  04-02-1928  DATE OF PROCEDURE:  11/11/2012 DATE OF DISCHARGE:                              OPERATIVE REPORT   OPERATIONS: 1. Coronary artery bypass grafting x4 (left internal mammary artery to     left anterior descending coronary artery, saphenous vein graft to     obtuse marginal 1, sequential saphenous vein graft to posterior     descending and posterior lateral branch of the right coronary). 2. Endoscopic harvest of right greater saphenous vein.  PREOPERATIVE DIAGNOSIS:  Severe multivessel coronary artery disease with unstable angina.  POSTOPERATIVE DIAGNOSIS:  Severe multivessel coronary artery disease with unstable angina.  SURGEON:  Kerin Perna, MD  ASSISTANT:  Doree Fudge, PA  ANESTHESIA:  General by Achille Rich, MD  INDICATIONS:  The patient is 79 years old and has recently developed symptoms of unstable angina.  He was admitted to the hospital and expeditiously underwent cardiac catheterization which demonstrated severe 3-vessel disease with a diffuse disease pattern.  Surgical revascularization was recommended.  I reviewed the coronary arteriograms and visit with the patient in his hospital room on several occasions and discussed his CAD, the role of CABG, and expected postoperative recovery as well as potential risks including stroke, MI, bleeding, and death. He demonstrated his understanding of the plan for CABG and agreed to proceed with surgery under what I felt was an informed consent.  OPERATIVE FINDINGS: 1. Adequate conduit. 2. Difficult target vessels due to diffuse and distal disease. 3. Intramyocardial obtuse marginal 1. 4. Distal circumflex less than 1 mm, too small to graft.  OPERATIVE PROCEDURE:  The patient was brought to the  operating room and placed supine on the operating table.  General anesthesia was induced under invasive hemodynamic monitoring.  The chest, abdomen, and legs were prepped with Betadine and draped as a sterile field.  A sternal incision was made as the saphenous vein was harvested endoscopically from the right leg.  The left internal mammary artery was harvested as a pedicle graft from its origin at the subclavian vessels.  He had moderate flow, but was adequate.  The sternal retractor was placed and the pericardium was opened and suspended.  Pursestrings were placed in the ascending aorta and right atrium and after the heparin was administered and the ACT documented as being therapeutic, the patient was cannulated and placed on cardiopulmonary bypass.  The coronary arteries were identified for grafting.  The posterior descending, posterolateral, OM-1, and distal LAD were found to be adequate targets, however, all the vessels except for the OM-1 were suboptimal targets due to distal disease.  Cardioplegia cannulas were placed for both antegrade and retrograde cold blood cardioplegia, and the patient was cooled to 32 degrees.  The aortic crossclamp was applied and 1 liter of cold blood cardioplegia was delivered in split doses between the antegrade aortic and retrograde coronary sinus catheters.  There was good cardioplegic arrest and septal temperature dropped less than 12 degrees.  Cardioplegia was delivered every 20 minutes.  The distal coronary anastomoses were then performed.  The first distal anastomosis was the sequential vein graft to the PD and PL.  The PD was 1.5-mm vessel with severe proximal 90% stenosis and moderate distal 70% stenosis.  A side-to-side anastomosis with the vein was constructed using running 7-0 Prolene with good flow through the graft.  The second distal anastomosis was a continuation of the sequential vein to the posterolateral or PL.  This was a 1.4-mm  vessel with proximal 80% stenosis and the end of the vein was sewn end-to-side with running 7-0 Prolene with good flow through the graft.  Cardioplegia was delivered through the sequential vein graft.  The third distal anastomosis was to the OM-1 branch of the circumflex. This was a 1.5-mm vessel with proximal 80% to 90% stenosis.  A reverse saphenous vein was sewn end-to-side with running 7-0 Prolene with good flow through the graft.  Cardioplegia was redosed.  The fourth distal anastomosis was to the distal LAD.  It was a 1.4-mm vessel.  The left IMA pedicle was brought through an opening, created in the left lateral pericardium.  The left IMA was flushed with papaverine and dilated gently in order to improve flow.  The mammary was then end- to-side anastomosed to the LAD using running 8-0 Prolene.  The Bulldog clamp was removed off the mammary pedicle briefly and there was good perfusion to the anastomosis which was hemostatic.  The Bulldog was reapplied and the pedicle was secured to the epicardium with 6-0 Prolene.  Cardioplegia was redosed.  While the crossclamp was still in place, 2 proximal vein anastomoses were performed using a 4.5-mm punch and running 6-0 Prolene.  Prior to tying down the final proximal anastomosis, air was vented from the coronaries with a dose of retrograde warm blood cardioplegia and the crossclamp was removed.  The heart resumed a spontaneous rhythm.  Air was vented from the coronaries and each were checked.  The vein grafts were checked and found to be hemostatic proximally and distally and had good flow.  The patient was then rewarmed to 37 degrees.  Temporary pacing wires were applied.  The lungs were expanded as the ventilator was resumed. The patient was then weaned off cardiopulmonary bypass without difficulty.  Cardiac output and the echocardiogram showed good LV function.  Protamine was administered without adverse reaction.  The cannulas  were removed.  The mediastinum was irrigated.  The superior pericardial fat was closed.  Anterior mediastinal and left pleural chest tube were placed and brought out through separate incisions.  The sternum was closed with wire.  The pectoralis fascia was closed with running #1 Vicryl.  The subcutaneous and skin layers were closed in running Vicryl and sterile dressings were applied.  Total cardiopulmonary bypass time was 110 minutes.     Kerin Perna, M.D.     PV/MEDQ  D:  11/11/2012  T:  11/12/2012  Job:  [id number]

## 2012-11-12 NOTE — Progress Notes (Signed)
INITIAL NUTRITION ASSESSMENT  DOCUMENTATION CODES Per approved criteria  -Not Applicable   INTERVENTION: RD to follow along for diet progression and assess need for oral nutrition supplements.  NUTRITION DIAGNOSIS: Increased nutrient needs related to post-op healing as evidenced by estimated needs.   Goal: Advance diet as tolerated. Intake to meet at least 90% of estimated needs.  Monitor:  weight trends, lab trends, I/O's, PO intake, supplement tolerance  Reason for Assessment: Low Braden  79 y.o. male  Admitting Dx: angina  ASSESSMENT: Admitted with dyspnea and CP. Work-up reveals unstable angina 2/2 severe 3-vessel CAD.  Underwent CABG x 4 on 8/26. Extubated on 8/26.  Currently on Clear Liquids, tolerated his coffee and grape juice this morning. Denies weight loss, states his usual weight is 178 lb and notes that his weight is currently elevated 2/2 fluid. States that he didn't follow any diet restrictions PTA. Denies any questions/concerns regarding nutrition at this time.  Height: Ht Readings from Last 1 Encounters:  11/07/12 6' (1.829 m)    Weight: Wt Readings from Last 1 Encounters:  11/12/12 186 lb 1.1 oz (84.4 kg)    Ideal Body Weight: 178 lb  % Ideal Body Weight: 96%  Wt Readings from Last 10 Encounters:  11/12/12 186 lb 1.1 oz (84.4 kg)  11/12/12 186 lb 1.1 oz (84.4 kg)  11/12/12 186 lb 1.1 oz (84.4 kg)  11/06/12 178 lb (80.74 kg)  11/06/12 177 lb 9.6 oz (80.559 kg)  08/14/12 181 lb 9.6 oz (82.373 kg)  07/07/12 181 lb 12.8 oz (82.464 kg)  04/17/12 185 lb 11.2 oz (84.233 kg)  01/08/12 179 lb 9.6 oz (81.466 kg)  09/25/11 178 lb 14.4 oz (81.149 kg)    Usual Body Weight: 178 - 181 lb  % Usual Body Weight: 103%  BMI:  Body mass index is 25.23 kg/(m^2). Overweight.  Estimated Nutritional Needs: Kcal: 1800 - 2000 Protein: 90 - 100 g Fluid: 1.8 - 2 liters  Skin:  Chest, R groin and R leg incision  Diet Order: Clear Liquid  EDUCATION  NEEDS: -No education needs identified at this time   Intake/Output Summary (Last 24 hours) at 11/12/12 0940 Last data filed at 11/12/12 0827  Gross per 24 hour  Intake 5331.43 ml  Output   5355 ml  Net -23.57 ml    Last BM: 8/25  Labs:   Recent Labs Lab 11/08/12 0500 11/10/12 1956  11/11/12 1411 11/11/12 1929 11/11/12 1930 11/12/12 0350  NA 139 137  < > 141 140  --  137  K 3.9 4.0  < > 3.3* 4.4  --  4.2  CL 107 104  --   --  109  --  106  CO2 20 22  --   --   --   --  22  BUN 22 25*  --   --  17  --  18  CREATININE 1.43* 1.50*  --   --  1.20 1.19 1.34  CALCIUM 9.0 9.2  --   --   --   --  8.0*  MG  --   --   --   --   --  2.9* 2.5  GLUCOSE 101* 106*  < > 111* 144*  --  139*  < > = values in this interval not displayed.  CBG (last 3)   Recent Labs  11/11/12 2330 11/12/12 0406 11/12/12 0742  GLUCAP 144* 111* 124*    Scheduled Meds: . acetaminophen  1,000 mg Oral Q6H  Or  . acetaminophen (TYLENOL) oral liquid 160 mg/5 mL  1,000 mg Per Tube Q6H  . allopurinol  100 mg Oral Daily  . aspirin EC  325 mg Oral Daily   Or  . aspirin  324 mg Per Tube Daily  . atorvastatin  80 mg Oral q1800  . bisacodyl  10 mg Oral Daily   Or  . bisacodyl  10 mg Rectal Daily  . cefUROXime (ZINACEF)  IV  1.5 g Intravenous Q12H  . cholecalciferol  2,000 Units Oral Daily  . colchicine  0.6 mg Oral Daily  . docusate sodium  200 mg Oral Daily  . DULoxetine  30 mg Oral 3 times weekly  . famotidine (PEPCID) IV  20 mg Intravenous Q12H  . insulin aspart  0-24 Units Subcutaneous Q4H  . insulin aspart  0-24 Units Subcutaneous Q4H  . insulin aspart  3 Units Subcutaneous TID WC  . metoprolol tartrate  12.5 mg Oral BID   Or  . metoprolol tartrate  12.5 mg Per Tube BID  . [START ON 11/13/2012] pantoprazole  40 mg Oral Daily  . sodium chloride  3 mL Intravenous Q12H  . vancomycin  1,000 mg Intravenous Q12H    Continuous Infusions: . sodium chloride 20 mL/hr at 11/11/12 1415  . sodium  chloride 20 mL/hr at 11/11/12 1415  . sodium chloride    . lactated ringers 20 mL/hr at 11/11/12 1415  . nitroGLYCERIN 5 mcg/min (11/11/12 1415)  . phenylephrine (NEO-SYNEPHRINE) Adult infusion 45 mcg/min (11/12/12 0800)    Past Medical History  Diagnosis Date  . Unspecified essential hypertension   . Electrocardiogram abnormal   . Renal insufficiency   . Sleep apnea     Questionable  . Carcinoma of prostate   . Shortness of breath     with exercise "  . GERD (gastroesophageal reflux disease)   . Gout     Past Surgical History  Procedure Laterality Date  . Retropubic prostatectomy      Radical  . Lymph node dissection      Bilateral pelvic  . Cardiac catheterization  11/07/2012    Dr Elease Hashimoto    Jarold Motto MS, RD, LDN Pager: [phone] After-hours pager: [phone]

## 2012-11-12 NOTE — Progress Notes (Signed)
Patient ID: WYLEY HACK, male   DOB: Aug 08, 1928, 79 y.o.   MRN: [id number]   SICU Evening Rounds:  Hemodynamics stable on dop 3, neo 15.   A-paced at 86  Urine output 20-60/hr.  BMET    Component Value Date/Time   NA 138 11/12/2012 1700   NA 140 04/17/2012 1110   K 4.1 11/12/2012 1700   K 4.1 04/17/2012 1110   CL 102 11/12/2012 1700   CL 105 04/17/2012 1110   CO2 22 11/12/2012 0350   CO2 25 04/17/2012 1110   GLUCOSE 141* 11/12/2012 1700   GLUCOSE 100* 04/17/2012 1110   BUN 22 11/12/2012 1700   BUN 23.9 04/17/2012 1110   CREATININE 1.50* 11/12/2012 1700   CREATININE 1.50* 11/12/2012 1700   CREATININE 1.53* 11/06/2012 1058   CREATININE 1.5* 04/17/2012 1110   CALCIUM 8.0* 11/12/2012 0350   CALCIUM 9.3 04/17/2012 1110   GFRNONAA 42* 11/12/2012 1700   GFRAA 48* 11/12/2012 1700    CBC    Component Value Date/Time   WBC 15.5* 11/12/2012 1700   WBC 8.9 11/06/2012 1229   WBC 8.8 04/17/2012 1110   RBC 2.66* 11/12/2012 1700   RBC 4.0* 11/06/2012 1229   RBC 3.83* 04/17/2012 1110   HGB 9.1* 11/12/2012 1700   HGB 8.8* 11/12/2012 1700   HGB 13.3* 11/06/2012 1229   HGB 13.5 04/17/2012 1110   HCT 26.4* 11/12/2012 1700   HCT 26.0* 11/12/2012 1700   HCT 40.1* 11/06/2012 1229   HCT 39.2 04/17/2012 1110   PLT 137* 11/12/2012 1700   PLT 217 04/17/2012 1110   MCV 99.2 11/12/2012 1700   MCV 100.4* 11/06/2012 1229   MCV 102.4* 04/17/2012 1110   MCH 34.2* 11/12/2012 1700   MCH 33.3* 11/06/2012 1229   MCH 35.1* 04/17/2012 1110   MCHC 34.5 11/12/2012 1700   MCHC 33.2 11/06/2012 1229   MCHC 34.3 04/17/2012 1110   RDW 12.2 11/12/2012 1700   RDW 12.3 04/17/2012 1110   LYMPHSABS 2.1 04/17/2012 1110   MONOABS 1.0* 04/17/2012 1110   EOSABS 0.1 04/17/2012 1110   BASOSABS 0.0 04/17/2012 1110    Wean drips as able.

## 2012-11-12 NOTE — Progress Notes (Addendum)
[address]       [place] [address]             [phone]      1 Day Post-Op Procedure(s) (LRB): CORONARY ARTERY BYPASS GRAFTING times four using Right Greater Saphenous Vein Graft harvested endoscopically and Left Internal Mammary Artery. (N/A) INTRAOPERATIVE TRANSESOPHAGEAL ECHOCARDIOGRAM (N/A)  Subjective:  Jon Reid is without complaints this morning.  States his pain isnt bad.  Per nursing during bedside dangle they think patient may have experienced a vagal episode.  Objective: Vital signs in last 24 hours: Temp:  [97.3 F (36.3 C)-99.3 F (37.4 C)] 99.3 F (37.4 C) (08/27 0700) Pulse Rate:  [61-80] 61 (08/27 0700) Cardiac Rhythm:  [-] Atrial paced (08/27 0400) Resp:  [11-24] 22 (08/27 0700) BP: (86-117)/(38-67) 86/38 mmHg (08/27 0700) SpO2:  [98 %-100 %] 100 % (08/27 0700) Arterial Line BP: (71-135)/(36-66) 93/37 mmHg (08/27 0700) FiO2 (%):  [40 %-50 %] 40 % (08/26 1731) Weight:  [186 lb 1.1 oz (84.4 kg)] 186 lb 1.1 oz (84.4 kg) (08/27 0600)  Hemodynamic parameters for last 24 hours: PAP: (23-44)/(10-27) 30/13 mmHg CO:  [3.8 L/min-6.7 L/min] 6.6 L/min CI:  [1.9 L/min/m2-3.3 L/min/m2] 3.3 L/min/m2  Intake/Output from previous day: 08/26 0701 - 08/27 0700 In: 5263.1 [P.O.:120; I.V.:3410.1; Blood:383; IV Piggyback:1350] Out: 5640 [Urine:4190; Blood:850; Chest Tube:600]  General appearance: alert, cooperative and no distress Heart: regular rate and rhythm Lungs: clear to auscultation bilaterally Abdomen: soft, non-tender; bowel sounds normal; no masses,  no organomegaly Extremities: edema trace Wound: clean and dyr  Lab Results:  Recent Labs  11/11/12 1930 11/12/12 0350  WBC 15.8* 14.4*  HGB 10.0* 9.4*  HCT 28.7* 26.6*  PLT 167 141*   BMET:  Recent Labs  11/10/12 1956  11/11/12 1929 11/11/12 1930 11/12/12 0350  NA 137  < > 140  --  137  K 4.0  < > 4.4  --  4.2  CL 104  --  109  --  106  CO2 22  --   --   --  22    GLUCOSE 106*  < > 144*  --  139*  BUN 25*  --  17  --  18  CREATININE 1.50*  --  1.20 1.19 1.34  CALCIUM 9.2  --   --   --  8.0*  < > = values in this interval not displayed.  PT/INR:  Recent Labs  11/11/12 1400  LABPROT 17.3*  INR 1.45   ABG    Component Value Date/Time   PHART 7.298* 11/11/2012 1933   HCO3 22.1 11/11/2012 1933   TCO2 24 11/11/2012 1933   ACIDBASEDEF 4.0* 11/11/2012 1933   O2SAT 99.0 11/11/2012 1933   CBG (last 3)   Recent Labs  11/11/12 2330 11/12/12 0406 11/12/12 0742  GLUCAP 144* 111* 124*    Assessment/Plan: S/P Procedure(s) (LRB): CORONARY ARTERY BYPASS GRAFTING times four using Right Greater Saphenous Vein Graft harvested endoscopically and Left Internal Mammary Artery. (N/A) INTRAOPERATIVE TRANSESOPHAGEAL ECHOCARDIOGRAM (N/A)  1. CV- NSR, Bradycardia under pacer- remains on Neo and Dopamine, which will be wean as tolerated 2. Pulm- wean oxygen as tolerated, some atelectasis on CXR 3. Renal- mild creatinine elevation, will follow mild volume overloaded, will need Lasix once off drips 4. Expected Acute Blood Loss Anemia- Hgb 9.4 will follow 5. CBGs- controlled, patient not a diabetic off insulin drip, continue SSIP for now 6. Dispo- POD #1 Progression orders, chest tubes dumped 120  cc this morning during bedside dangle   LOS: 5 days    BARRETT, ERIN 11/12/2012 Wean neo. DC tubes. OOB to chair patient examined and medical record reviewed,agree with above note. VAN TRIGT III,PETER 11/12/2012

## 2012-11-12 NOTE — Progress Notes (Signed)
Dr Donata Clay updated at bedside with rounds. Updated pt on neosynephrine gtt at 45 mcg and dopamine gtt at 3 mcg. MD ok to wean neo gtt for SBP>90, once gtt down to , can wean dopamine gtt off. Will continue to monitor. Koren Bound

## 2012-11-13 ENCOUNTER — Inpatient Hospital Stay (HOSPITAL_COMMUNITY): Payer: MEDICARE

## 2012-11-13 DIAGNOSIS — I369 Nonrheumatic tricuspid valve disorder, unspecified: Secondary | ICD-10-CM

## 2012-11-13 LAB — BASIC METABOLIC PANEL
BUN: 25 mg/dL — ABNORMAL HIGH (ref 6–23)
CO2: 24 mEq/L (ref 19–32)
Calcium: 8.4 mg/dL (ref 8.4–10.5)
Chloride: 103 mEq/L (ref 96–112)
Creatinine, Ser: 1.49 mg/dL — ABNORMAL HIGH (ref 0.50–1.35)
GFR calc Af Amer: 49 mL/min — ABNORMAL LOW (ref >90)
GFR calc non Af Amer: 42 mL/min — ABNORMAL LOW (ref >90)
Glucose, Bld: 110 mg/dL — ABNORMAL HIGH (ref 70–99)
Potassium: 3.8 mEq/L (ref 3.5–5.1)
Sodium: 135 mEq/L (ref 135–145)

## 2012-11-13 LAB — GLUCOSE, CAPILLARY
Glucose-Capillary: 107 mg/dL — ABNORMAL HIGH (ref 70–99)
Glucose-Capillary: 110 mg/dL — ABNORMAL HIGH (ref 70–99)
Glucose-Capillary: 115 mg/dL — ABNORMAL HIGH (ref 70–99)
Glucose-Capillary: 120 mg/dL — ABNORMAL HIGH (ref 70–99)
Glucose-Capillary: 121 mg/dL — ABNORMAL HIGH (ref 70–99)

## 2012-11-13 LAB — CBC
HCT: 24.3 % — ABNORMAL LOW (ref 39.0–52.0)
Hemoglobin: 8.6 g/dL — ABNORMAL LOW (ref 13.0–17.0)
RBC: 2.44 MIL/uL — ABNORMAL LOW (ref 4.22–5.81)
WBC: 13.4 10*3/uL — ABNORMAL HIGH (ref 4.0–10.5)

## 2012-11-13 MED ORDER — FOLIC ACID 1 MG PO TABS
1.0000 mg | ORAL_TABLET | Freq: Every day | ORAL | Status: DC
  Administered 2012-11-13 – 2012-11-17 (×5): 1 mg via ORAL
  Filled 2012-11-13 (×5): qty 1

## 2012-11-13 MED ORDER — FUROSEMIDE 40 MG PO TABS
40.0000 mg | ORAL_TABLET | Freq: Every day | ORAL | Status: DC
  Filled 2012-11-13 (×4): qty 1

## 2012-11-13 MED ORDER — CALCIUM CHLORIDE 10 % IV SOLN
1.0000 g | Freq: Once | INTRAVENOUS | Status: AC
  Administered 2012-11-13: 1 g via INTRAVENOUS

## 2012-11-13 MED ORDER — ALBUMIN HUMAN 5 % IV SOLN
12.5000 g | Freq: Once | INTRAVENOUS | Status: AC
  Administered 2012-11-13: 12.5 g via INTRAVENOUS

## 2012-11-13 MED ORDER — FERROUS FUMARATE 325 (106 FE) MG PO TABS
1.0000 | ORAL_TABLET | Freq: Three times a day (TID) | ORAL | Status: DC
  Administered 2012-11-13 – 2012-11-17 (×14): 106 mg via ORAL
  Filled 2012-11-13 (×16): qty 1

## 2012-11-13 MED ORDER — SODIUM CHLORIDE 0.9 % IV SOLN
1.0000 g | Freq: Once | INTRAVENOUS | Status: DC
  Filled 2012-11-13: qty 10

## 2012-11-13 MED ORDER — ALBUMIN HUMAN 5 % IV SOLN
INTRAVENOUS | Status: AC
  Administered 2012-11-13: 12.5 g via INTRAVENOUS
  Filled 2012-11-13: qty 250

## 2012-11-13 NOTE — Progress Notes (Signed)
[facility] p.m. Rounds  Sinus rhythm walking hallway Remains with soft blood pressure on the oh drip-2-D echocardiogram today shows normal LV EF Hemoglobin 8.6 Chest x-ray clear with minimal atelectasis left base Will give one bottle albumin this evening, continue to try to wean neo-off

## 2012-11-13 NOTE — Progress Notes (Addendum)
      [address]       [place] [address]             [phone]      2 Days Post-Op Procedure(s) (LRB): CORONARY ARTERY BYPASS GRAFTING times four using Right Greater Saphenous Vein Graft harvested endoscopically and Left Internal Mammary Artery. (N/A) INTRAOPERATIVE TRANSESOPHAGEAL ECHOCARDIOGRAM (N/A)  Subjective:  Jon Reid states he is feeling better today in comparison to yesterday.  His pain is well controlled  Objective: Vital signs in last 24 hours: Temp:  [97.8 F (36.6 C)-99.3 F (37.4 C)] 97.8 F (36.6 C) (08/28 0742) Pulse Rate:  [78-93] 86 (08/28 0715) Cardiac Rhythm:  [-] Atrial paced (08/28 0400) Resp:  [12-26] 16 (08/28 0715) BP: (83-113)/(36-54) 90/42 mmHg (08/28 0715) SpO2:  [73 %-100 %] 92 % (08/28 0715) Arterial Line BP: (85-128)/(32-47) 107/36 mmHg (08/27 1145) Weight:  [189 lb 2.5 oz (85.8 kg)] 189 lb 2.5 oz (85.8 kg) (08/28 0500)  Hemodynamic parameters for last 24 hours: PAP: (26-32)/(9-13) 30/11 mmHg CO:  [7.3 L/min] 7.3 L/min CI:  [3.6 L/min/m2] 3.6 L/min/m2  Intake/Output from previous day: 08/27 0701 - 08/28 0700 In: 2492.1 [P.O.:540; I.V.:1352.1; IV Piggyback:600] Out: 875 [Urine:805; Chest Tube:70]  General appearance: alert, cooperative and no distress Heart: regular rate and rhythm Lungs: diminished breath sounds LLL Abdomen: soft, non-tender; bowel sounds normal; no masses,  no organomegaly Extremities: edema 1-2+ pitting Wound: clean and dry EVH site, will remove dressing from sternotomy tomorrow  Lab Results:  Recent Labs  11/12/12 1700 11/13/12 0428  WBC 15.5* 13.4*  HGB 9.1*  8.8* 8.6*  HCT 26.4*  26.0* 24.3*  PLT 137* 124*   BMET:  Recent Labs  11/12/12 0350 11/12/12 1700 11/13/12 0428  NA 137 138 135  K 4.2 4.1 3.8  CL 106 102 103  CO2 22  --  24  GLUCOSE 139* 141* 110*  BUN 18 22 25*  CREATININE 1.34 1.50*  1.50* 1.49*  CALCIUM 8.0*  --  8.4    PT/INR:  Recent Labs   11/11/12 1400  LABPROT 17.3*  INR 1.45   ABG    Component Value Date/Time   PHART 7.298* 11/11/2012 1933   HCO3 22.1 11/11/2012 1933   TCO2 23 11/12/2012 1700   ACIDBASEDEF 4.0* 11/11/2012 1933   O2SAT 99.0 11/11/2012 1933   CBG (last 3)   Recent Labs  11/12/12 1959 11/13/12 0020 11/13/12 0411  GLUCAP 111* 110* 107*    Assessment/Plan: S/P Procedure(s) (LRB): CORONARY ARTERY BYPASS GRAFTING times four using Right Greater Saphenous Vein Graft harvested endoscopically and Left Internal Mammary Artery. (N/A) INTRAOPERATIVE TRANSESOPHAGEAL ECHOCARDIOGRAM (N/A)  1. CV- remains on Neo and Dopamine, will wean as tolerated 2. Pulm- remains on oxygen, encouraged use of IS, small effusion on left 3. Renal- creatinine is stable at 1.49, lytes okay, volume overloaded +LE edema, weight is up 14 lbs, will need diuresis once appropriate 4. Expected Acute Blood Loss Anemia- Hgb 8.6- will start Iron, and monitor 5. CBGs- sugars remain controlled, continue SSIP 6. Dispo- patient stable, continue current care   LOS: 6 days    BARRETT, ERIN 11/13/2012  Start PO lasix for edema EKG looks fine Wean drips then he may transfer to step down

## 2012-11-14 ENCOUNTER — Inpatient Hospital Stay (HOSPITAL_COMMUNITY): Payer: MEDICARE

## 2012-11-14 LAB — TYPE AND SCREEN
ABO/RH(D): O POS
Antibody Screen: NEGATIVE
Unit division: 0
Unit division: 0

## 2012-11-14 LAB — CBC
HCT: 22.8 % — ABNORMAL LOW (ref 39.0–52.0)
Hemoglobin: 7.8 g/dL — ABNORMAL LOW (ref 13.0–17.0)
MCH: 33.8 pg (ref 26.0–34.0)
MCHC: 34.2 g/dL (ref 30.0–36.0)
MCV: 98.7 fL (ref 78.0–100.0)
Platelets: 126 10*3/uL — ABNORMAL LOW (ref 150–400)
RBC: 2.31 MIL/uL — ABNORMAL LOW (ref 4.22–5.81)
RDW: 12.5 % (ref 11.5–15.5)
WBC: 11.3 10*3/uL — ABNORMAL HIGH (ref 4.0–10.5)

## 2012-11-14 LAB — COMPREHENSIVE METABOLIC PANEL
ALT: 14 U/L (ref 0–53)
AST: 23 U/L (ref 0–37)
Albumin: 2.9 g/dL — ABNORMAL LOW (ref 3.5–5.2)
Alkaline Phosphatase: 51 U/L (ref 39–117)
BUN: 28 mg/dL — ABNORMAL HIGH (ref 6–23)
CO2: 24 mEq/L (ref 19–32)
Calcium: 8.7 mg/dL (ref 8.4–10.5)
Chloride: 102 mEq/L (ref 96–112)
Creatinine, Ser: 1.37 mg/dL — ABNORMAL HIGH (ref 0.50–1.35)
GFR calc Af Amer: 54 mL/min — ABNORMAL LOW (ref >90)
GFR calc non Af Amer: 46 mL/min — ABNORMAL LOW (ref >90)
Glucose, Bld: 112 mg/dL — ABNORMAL HIGH (ref 70–99)
Potassium: 3.8 mEq/L (ref 3.5–5.1)
Sodium: 135 mEq/L (ref 135–145)
Total Bilirubin: 0.5 mg/dL (ref 0.3–1.2)
Total Protein: 5.3 g/dL — ABNORMAL LOW (ref 6.0–8.3)

## 2012-11-14 LAB — PREPARE RBC (CROSSMATCH)

## 2012-11-14 LAB — GLUCOSE, CAPILLARY
Glucose-Capillary: 104 mg/dL — ABNORMAL HIGH (ref 70–99)
Glucose-Capillary: 107 mg/dL — ABNORMAL HIGH (ref 70–99)
Glucose-Capillary: 108 mg/dL — ABNORMAL HIGH (ref 70–99)

## 2012-11-14 MED ORDER — LACTULOSE 10 GM/15ML PO SOLN
30.0000 g | Freq: Every day | ORAL | Status: AC
  Administered 2012-11-14: 30 g via ORAL
  Filled 2012-11-14: qty 45

## 2012-11-14 MED ORDER — POLYETHYLENE GLYCOL 3350 17 G PO PACK
17.0000 g | PACK | Freq: Once | ORAL | Status: AC
  Administered 2012-11-14: 17 g via ORAL
  Filled 2012-11-14: qty 1

## 2012-11-14 MED ORDER — FUROSEMIDE 10 MG/ML IJ SOLN
20.0000 mg | Freq: Once | INTRAMUSCULAR | Status: AC
  Administered 2012-11-14: 20 mg via INTRAVENOUS
  Filled 2012-11-14: qty 2

## 2012-11-14 MED ORDER — LACTULOSE 10 GM/15ML PO SOLN: 20.0000 g | Freq: Every day | ORAL | Status: DC

## 2012-11-14 NOTE — Progress Notes (Signed)
[facility] BRIEF SICU PROGRESS NOTE  3 Days Post-Op  S/P Procedure(s) (LRB): CORONARY ARTERY BYPASS GRAFTING times four using Right Greater Saphenous Vein Graft harvested endoscopically and Left Internal Mammary Artery. (N/A) INTRAOPERATIVE TRANSESOPHAGEAL ECHOCARDIOGRAM (N/A)   Stable day overall. Received 1 unit PRBC's AAI paced w/ BP 80-90 systolic UOP adequate  Plan: Continue current plan  Zani Kyllonen H 11/14/2012 7:20 PM

## 2012-11-14 NOTE — Progress Notes (Addendum)
      [address]       [place] [address]             [phone]      3 Days Post-Op Procedure(s) (LRB): CORONARY ARTERY BYPASS GRAFTING times four using Right Greater Saphenous Vein Graft harvested endoscopically and Left Internal Mammary Artery. (N/A) INTRAOPERATIVE TRANSESOPHAGEAL ECHOCARDIOGRAM (N/A)  Subjective:  Mr. Reuter states he feels run down today.  He states he does feel as energetic as yesterday.  + Ambulation, No BM  Objective: Vital signs in last 24 hours: Temp:  [97.4 F (36.3 C)-97.9 F (36.6 C)] 97.9 F (36.6 C) (08/29 0430) Pulse Rate:  [34-93] 92 (08/29 0700) Cardiac Rhythm:  [-] Atrial paced;Heart block (08/29 0400) Resp:  [15-25] 23 (08/29 0700) BP: (79-127)/(34-64) 110/60 mmHg (08/29 0700) SpO2:  [93 %-100 %] 99 % (08/29 0700) Weight:  [191 lb 12.8 oz (87 kg)] 191 lb 12.8 oz (87 kg) (08/29 0700)  Hemodynamic parameters for last 24 hours: CVP:  [7 mmHg-13 mmHg] 13 mmHg  Intake/Output from previous day: 08/28 0701 - 08/29 0700 In: 1877.6 [P.O.:1080; I.V.:347.6; IV Piggyback:450] Out: 1375 [Urine:1375]  General appearance: alert, cooperative and no distress Heart: regular rate and rhythm Lungs: clear to auscultation bilaterally Abdomen: soft, non-tender; bowel sounds normal; no masses,  no organomegaly Extremities: edema 1+ Wound: clean and dry  Lab Results:  Recent Labs  11/13/12 0428 11/14/12 0435  WBC 13.4* 11.3*  HGB 8.6* 7.8*  HCT 24.3* 22.8*  PLT 124* 126*   BMET:  Recent Labs  11/13/12 0428 11/14/12 0435  NA 135 135  K 3.8 3.8  CL 103 102  CO2 24 24  GLUCOSE 110* 112*  BUN 25* 28*  CREATININE 1.49* 1.37*  CALCIUM 8.4 8.7    PT/INR:  Recent Labs  11/11/12 1400  LABPROT 17.3*  INR 1.45   ABG    Component Value Date/Time   PHART 7.298* 11/11/2012 1933   HCO3 22.1 11/11/2012 1933   TCO2 23 11/12/2012 1700   ACIDBASEDEF 4.0* 11/11/2012 1933   O2SAT 99.0 11/11/2012 1933   CBG (last 3)   Recent  Labs  11/13/12 1544 11/13/12 2003 11/14/12 0415  GLUCAP 120* 115* 104*    Assessment/Plan: S/P Procedure(s) (LRB): CORONARY ARTERY BYPASS GRAFTING times four using Right Greater Saphenous Vein Graft harvested endoscopically and Left Internal Mammary Artery. (N/A) INTRAOPERATIVE TRANSESOPHAGEAL ECHOCARDIOGRAM (N/A)  1. CV- NSR, remains hypotension- on Neo, wean as tolerated 2D echo yesterday w/ nl LV fx 2. Pulm- no acute issues, wean oxygen as tolerated, continue IS 3. Renal- creatinine trending down, good U/O, remains volume overloaded, on Lasix 4. Expected Acute Blood Loss Anemia- Hgb 7.8, with continued hypotension, would benefit from transfusion will discuss with staff 5. LOC-constipation, lactulose prn 6. CBGs- controlled, patient not a diabetic, will discontinue glucose checks 7. Dispo- patient stable, wean Neo as tolerated   LOS: 7 days    BARRETT, ERIN 11/14/2012  One unit blood and wean neo Cont daily lasix

## 2012-11-15 ENCOUNTER — Inpatient Hospital Stay (HOSPITAL_COMMUNITY): Payer: MEDICARE

## 2012-11-15 LAB — TYPE AND SCREEN
ABO/RH(D): O POS
Antibody Screen: NEGATIVE
Unit division: 0

## 2012-11-15 LAB — BASIC METABOLIC PANEL
BUN: 28 mg/dL — ABNORMAL HIGH (ref 6–23)
CO2: 24 mEq/L (ref 19–32)
Calcium: 9 mg/dL (ref 8.4–10.5)
Chloride: 101 mEq/L (ref 96–112)
Creatinine, Ser: 1.46 mg/dL — ABNORMAL HIGH (ref 0.50–1.35)

## 2012-11-15 LAB — CBC
HCT: 28.7 % — ABNORMAL LOW (ref 39.0–52.0)
MCH: 33 pg (ref 26.0–34.0)
MCV: 96.6 fL (ref 78.0–100.0)
Platelets: 179 10*3/uL (ref 150–400)
RBC: 2.97 MIL/uL — ABNORMAL LOW (ref 4.22–5.81)
RDW: 13.2 % (ref 11.5–15.5)

## 2012-11-15 LAB — CORTISOL-AM, BLOOD: Cortisol - AM: 22.4 ug/dL (ref 4.3–22.4)

## 2012-11-15 LAB — GLUCOSE, CAPILLARY: Glucose-Capillary: 97 mg/dL (ref 70–99)

## 2012-11-15 MED ORDER — POTASSIUM CHLORIDE CRYS ER 20 MEQ PO TBCR
20.0000 meq | EXTENDED_RELEASE_TABLET | Freq: Every day | ORAL | Status: DC
  Filled 2012-11-15: qty 1

## 2012-11-15 MED ORDER — ASPIRIN EC 325 MG PO TBEC
325.0000 mg | DELAYED_RELEASE_TABLET | Freq: Every day | ORAL | Status: DC
  Administered 2012-11-16 – 2012-11-17 (×2): 325 mg via ORAL
  Filled 2012-11-15 (×3): qty 1

## 2012-11-15 MED ORDER — SODIUM CHLORIDE 0.9 % IJ SOLN: 3.0000 mL | INTRAMUSCULAR | Status: DC | PRN

## 2012-11-15 MED ORDER — METOPROLOL SUCCINATE ER 25 MG PO TB24
25.0000 mg | ORAL_TABLET | Freq: Every day | ORAL | Status: DC
  Filled 2012-11-15: qty 1

## 2012-11-15 MED ORDER — SODIUM CHLORIDE 0.9 % IJ SOLN
3.0000 mL | Freq: Two times a day (BID) | INTRAMUSCULAR | Status: DC
  Administered 2012-11-15 – 2012-11-16 (×3): 3 mL via INTRAVENOUS

## 2012-11-15 MED ORDER — SODIUM CHLORIDE 0.9 % IV SOLN: 250.0000 mL | INTRAVENOUS | Status: DC | PRN

## 2012-11-15 MED ORDER — FUROSEMIDE 40 MG PO TABS
40.0000 mg | ORAL_TABLET | Freq: Every day | ORAL | Status: DC
  Administered 2012-11-16 – 2012-11-17 (×2): 40 mg via ORAL
  Filled 2012-11-15 (×2): qty 1

## 2012-11-15 MED ORDER — MOVING RIGHT ALONG BOOK
Freq: Once | Status: AC
  Administered 2012-11-15: 12:00:00
  Filled 2012-11-15 (×2): qty 1

## 2012-11-15 NOTE — Progress Notes (Signed)
Pt transferred to 2W16. Pt ambulated distance to new unit pushing wheelchair, tolerated well. Denies pain with ambulation. Pt on portable telemetry monitor for transfer, room air. Placed on 2w16 tele monitor on arrival to unit, receiving RN at bedside. Pt belongings transferred, pt wearing partial denture plates. Pt wife/son present. Koren Bound

## 2012-11-15 NOTE — Plan of Care (Signed)
Problem: Phase III Progression Outcomes Goal: Time patient transferred to PCTU/Telemetry POD Outcome: Completed/Met Date Met:  11/15/12 Transferred ~ 1610 [id number]

## 2012-11-15 NOTE — Progress Notes (Signed)
      [address]       [place] [address]             [phone]        CARDIOTHORACIC SURGERY PROGRESS NOTE   R4 Days Post-Op Procedure(s) (LRB): CORONARY ARTERY BYPASS GRAFTING times four using Right Greater Saphenous Vein Graft harvested endoscopically and Left Internal Mammary Artery. (N/A) INTRAOPERATIVE TRANSESOPHAGEAL ECHOCARDIOGRAM (N/A)  Subjective: Feels well.  Ambulating at a brisk pace w/out assistance.  Minimal pain.  Slept well.  Objective: Vital signs: BP Readings from Last 1 Encounters:  11/15/12 92/38   Pulse Readings from Last 1 Encounters:  11/15/12 90   Resp Readings from Last 1 Encounters:  11/15/12 21   Temp Readings from Last 1 Encounters:  11/15/12 98.2 F (36.8 C) Oral    Hemodynamics:    Physical Exam:  Rhythm:   sinus  Breath sounds: clear  Heart sounds:  RRR  Incisions:  Clean and dry  Abdomen:  Soft, non-distended, non-tender  Extremities:  Warm, well-perfused    Intake/Output from previous day: 08/29 0701 - 08/30 0700 In: 1429.7 [P.O.:1010; I.V.:407.2; Blood:12.5] Out: 1125 [Urine:1125] Intake/Output this shift:    Lab Results:  CBC: Recent Labs  11/14/12 0435 11/15/12 0520  WBC 11.3* 10.1  HGB 7.8* 9.8*  HCT 22.8* 28.7*  PLT 126* 179    BMET:  Recent Labs  11/14/12 0435 11/15/12 0520  NA 135 135  K 3.8 3.9  CL 102 101  CO2 24 24  GLUCOSE 112* 108*  BUN 28* 28*  CREATININE 1.37* 1.46*  CALCIUM 8.7 9.0     CBG (last 3)   Recent Labs  11/14/12 1148 11/14/12 1536 11/15/12 0753  GLUCAP 108* 130* 97    ABG    Component Value Date/Time   PHART 7.298* 11/11/2012 1933   PCO2ART 45.1* 11/11/2012 1933   PO2ART 136.0* 11/11/2012 1933   HCO3 22.1 11/11/2012 1933   TCO2 23 11/12/2012 1700   ACIDBASEDEF 4.0* 11/11/2012 1933   O2SAT 99.0 11/11/2012 1933    CXR: *RADIOLOGY REPORT*  Clinical Data: Status post CABG  CHEST - 2 VIEW  Comparison: Prior radiograph from 11/14/2012  Findings:  Median sternotomy wires of underlying CABG markers again  noted. The transverse heart size is stable. The right IJ central  venous catheter has been removed. Transverse heart size is  unchanged.  The current examination has been performed with a similar degree of  lung inflation. Bilateral pleural effusions, left greater than  right persist, slightly improved on the right side as compared to  the prior exam. There is persistent bibasilar atelectasis. No  pulmonary edema or pneumothorax.  Osseous structures are unchanged.  IMPRESSION:  Persistent bilateral pleural effusions, left greater than right,  with associated bibasilar atelectasis. The right pleural effusion  is slightly improved as compared to the prior exam.  Original Report Authenticated By: Rise Mu, M.D.   Assessment/Plan: S/P Procedure(s) (LRB): CORONARY ARTERY BYPASS GRAFTING times four using Right Greater Saphenous Vein Graft harvested endoscopically and Left Internal Mammary Artery. (N/A) INTRAOPERATIVE TRANSESOPHAGEAL ECHOCARDIOGRAM (N/A)  Overall stable POD4 BP improved post transfusion Expected post op acute blood loss anemia, mild, improved Expected post op volume excess, mild Expected post op atelectasis, moderate, w/ elevation of left diaphragm Chronic renal insufficiency, mild, stable   Mobilize  Watch renal function and Hgb  Pulm toilet  Transfer 2000    Jerolene Kupfer H 11/15/2012 11:30 AM

## 2012-11-15 NOTE — Progress Notes (Addendum)
Phase I Cardiac Rehab  Pt ambulated in hallway twice today.  Education completed including sternal precautions, when to call MD, IS use, progressive ambulation and nutrition.  Oriented to CRPII.  Pt declined at this time as he is comfortable exercising on his own and would have a significant drive to outpatient facility.  Pt given instructions to watch post surgery video.  Understanding verbalized

## 2012-11-15 NOTE — Progress Notes (Signed)
Pt Rt IJ SGC sleeve D/C'd per MD order, Pt reclined flat and sheath removed without difficulty and intact, pressure held it the site x 10 mins with hemostasis noted. Dressing applied of petroleum gauze and 4X4 secured with tape. Pt tolerated procedure without difficulty.

## 2012-11-15 NOTE — Plan of Care (Signed)
Problem: Phase III Progression Outcomes Goal: Transfer to PCTU/Telemetry POD Outcome: Completed/Met Date Met:  11/15/12 Transfer to 1O10

## 2012-11-16 ENCOUNTER — Inpatient Hospital Stay (HOSPITAL_COMMUNITY): Payer: MEDICARE

## 2012-11-16 LAB — CBC
Hemoglobin: 8.9 g/dL — ABNORMAL LOW (ref 13.0–17.0)
MCH: 33.1 pg (ref 26.0–34.0)
Platelets: 193 10*3/uL (ref 150–400)
RBC: 2.69 MIL/uL — ABNORMAL LOW (ref 4.22–5.81)
WBC: 7.6 10*3/uL (ref 4.0–10.5)

## 2012-11-16 LAB — BASIC METABOLIC PANEL
CO2: 23 mEq/L (ref 19–32)
Calcium: 8.5 mg/dL (ref 8.4–10.5)
Glucose, Bld: 123 mg/dL — ABNORMAL HIGH (ref 70–99)
Potassium: 3.6 mEq/L (ref 3.5–5.1)
Sodium: 136 mEq/L (ref 135–145)

## 2012-11-16 MED ORDER — POTASSIUM CHLORIDE CRYS ER 20 MEQ PO TBCR
40.0000 meq | EXTENDED_RELEASE_TABLET | Freq: Every day | ORAL | Status: DC
  Administered 2012-11-16 – 2012-11-17 (×2): 40 meq via ORAL
  Filled 2012-11-16: qty 2

## 2012-11-16 MED ORDER — METOPROLOL TARTRATE 12.5 MG HALF TABLET
12.5000 mg | ORAL_TABLET | Freq: Two times a day (BID) | ORAL | Status: DC
  Administered 2012-11-16: 12.5 mg via ORAL
  Filled 2012-11-16 (×4): qty 1

## 2012-11-16 NOTE — Progress Notes (Signed)
Pt ambulated in hallway with wheelchair 750 ft and tolerated activity well on room air. Will continue to monitor.

## 2012-11-16 NOTE — Progress Notes (Signed)
Central monitoring informed the nurse that the patient had a run of trigeminey. The nurse checked on the patient and the patient was asymptomatic. Harmon Pier

## 2012-11-16 NOTE — Progress Notes (Addendum)
       [address]       [place] [address]             [phone]          5 Days Post-Op Procedure(s) (LRB): CORONARY ARTERY BYPASS GRAFTING times four using Right Greater Saphenous Vein Graft harvested endoscopically and Left Internal Mammary Artery. (N/A) INTRAOPERATIVE TRANSESOPHAGEAL ECHOCARDIOGRAM (N/A)  Subjective: Still gets lightheaded with walking, but overall feeling better.  No BM yet.   Objective: Vital signs in last 24 hours: Patient Vitals for the past 24 hrs:  BP Temp Temp src Pulse Resp SpO2 Weight  11/16/12 0500 - - - - - - 190 lb 1.6 oz (86.229 kg)  11/16/12 0410 105/64 mmHg 98.9 F (37.2 C) Oral 78 18 92 % -  11/15/12 2044 97/66 mmHg 98.6 F (37 C) Oral 82 18 91 % -  11/15/12 1428 - - - - - 95 % -  11/15/12 1419 148/78 mmHg 98.8 F (37.1 C) Oral 79 18 91 % -  11/15/12 1300 95/58 mmHg - - 75 19 96 % -  11/15/12 1239 - 98 F (36.7 C) Oral - - - -  11/15/12 1200 98/51 mmHg - - 78 15 96 % -  11/15/12 1100 102/47 mmHg - - 80 20 97 % -  11/15/12 1000 92/38 mmHg - - 90 21 95 % -  11/15/12 0900 103/51 mmHg - - 80 21 97 % -   Current Weight  11/16/12 190 lb 1.6 oz (86.229 kg)  PRE OP WEIGHT: 79 kg    Intake/Output from previous day: 08/30 0701 - 08/31 0700 In: 463 [P.O.:460; I.V.:3] Out: 1250 [Urine:1250]    PHYSICAL EXAM:  Heart: RRR Lungs: Clear Wound:Clean and dry Extremities: +RLE edema    Lab Results: CBC: Recent Labs  11/15/12 0520 11/16/12 0420  WBC 10.1 7.6  HGB 9.8* 8.9*  HCT 28.7* 26.0*  PLT 179 193   BMET:  Recent Labs  11/15/12 0520 11/16/12 0420  NA 135 136  K 3.9 3.6  CL 101 104  CO2 24 23  GLUCOSE 108* 123*  BUN 28* 29*  CREATININE 1.46* 1.40*  CALCIUM 9.0 8.5    PT/INR: No results found for this basename: LABPROT, INR,  in the last 72 hours  CXR: Findings: The cardiac shadow is stable. Bilateral pleural  effusions are again identified left greater than right. No new  focal  infiltrate is seen. Postsurgical changes are again seen.  IMPRESSION:  No change from prior exam. Bilateral pleural effusions are seen  with associated atelectasis.   Assessment/Plan: S/P Procedure(s) (LRB): CORONARY ARTERY BYPASS GRAFTING times four using Right Greater Saphenous Vein Graft harvested endoscopically and Left Internal Mammary Artery. (N/A) INTRAOPERATIVE TRANSESOPHAGEAL ECHOCARDIOGRAM (N/A) CV- SR, SBPs low normal.  He is having some lightheadedness with exertion, so will decrease beta blocker. Vol overload- diurese. Acute/chronic renal insufficiency- Cr stable. Continue to monitor. Expected postop blood loss anemia- stable, continue Fe. CRPI ,pulm toilet.     LOS: 9 days    COLLINS,GINA H 11/16/2012  I have seen and examined the patient and agree with the assessment and plan as outlined.  Possibly ready for d/c home 1-2 days.  Ethelda Deangelo H 11/16/2012 12:01 PM

## 2012-11-16 NOTE — Progress Notes (Signed)
EPWs removed per MD order and protocol. Wire ends intact. Pt tolerated procedure well. Pt resting in bed X 1 hour. VSS. Will continue to monitor

## 2012-11-17 LAB — BASIC METABOLIC PANEL
BUN: 26 mg/dL — ABNORMAL HIGH (ref 6–23)
Chloride: 103 mEq/L (ref 96–112)
GFR calc Af Amer: 53 mL/min — ABNORMAL LOW (ref >90)
GFR calc non Af Amer: 46 mL/min — ABNORMAL LOW (ref >90)
Glucose, Bld: 104 mg/dL — ABNORMAL HIGH (ref 70–99)
Potassium: 3.7 mEq/L (ref 3.5–5.1)
Sodium: 137 mEq/L (ref 135–145)

## 2012-11-17 LAB — CBC
HCT: 26 % — ABNORMAL LOW (ref 39.0–52.0)
Hemoglobin: 9.1 g/dL — ABNORMAL LOW (ref 13.0–17.0)
MCH: 34.2 pg — ABNORMAL HIGH (ref 26.0–34.0)
RBC: 2.66 MIL/uL — ABNORMAL LOW (ref 4.22–5.81)

## 2012-11-17 MED ORDER — FUROSEMIDE 40 MG PO TABS: 40.0000 mg | ORAL_TABLET | Freq: Every day | ORAL | Status: DC

## 2012-11-17 MED ORDER — METOPROLOL SUCCINATE ER 25 MG PO TB24
25.0000 mg | ORAL_TABLET | Freq: Every day | ORAL | Status: DC
  Administered 2012-11-17: 25 mg via ORAL
  Filled 2012-11-17: qty 1

## 2012-11-17 MED ORDER — POTASSIUM CHLORIDE CRYS ER 20 MEQ PO TBCR: 20.0000 meq | EXTENDED_RELEASE_TABLET | Freq: Every day | ORAL | Status: DC

## 2012-11-17 MED ORDER — FOLIC ACID 1 MG PO TABS: 1.0000 mg | ORAL_TABLET | Freq: Every day | ORAL | Status: DC

## 2012-11-17 MED ORDER — TRAMADOL HCL 50 MG PO TABS: 50.0000 mg | ORAL_TABLET | Freq: Four times a day (QID) | ORAL | Status: DC | PRN

## 2012-11-17 MED ORDER — ASPIRIN 325 MG PO TBEC: 325.0000 mg | DELAYED_RELEASE_TABLET | Freq: Every day | ORAL | Status: DC

## 2012-11-17 MED ORDER — ATORVASTATIN CALCIUM 80 MG PO TABS: 80.0000 mg | ORAL_TABLET | Freq: Every day | ORAL | Status: DC

## 2012-11-17 MED ORDER — FERROUS FUMARATE 325 (106 FE) MG PO TABS: 1.0000 | ORAL_TABLET | Freq: Two times a day (BID) | ORAL | Status: DC

## 2012-11-17 NOTE — Progress Notes (Signed)
Discharge instructions along with med list and scripts provided;verbalized understanding. Patient transported via wheelchair to lobby for d/c home per volunteer staff.Jon Reid

## 2012-11-17 NOTE — Progress Notes (Signed)
Chest tube sutures removed intact. Steri-strips applied; instructed not to removed.Jon Reid

## 2012-11-17 NOTE — Anesthesia Postprocedure Evaluation (Signed)
  Anesthesia Post-op Note  Patient: Jon Reid  Procedure(s) Performed: Procedure(s): CORONARY ARTERY BYPASS GRAFTING times four using Right Greater Saphenous Vein Graft harvested endoscopically and Left Internal Mammary Artery. (N/A) INTRAOPERATIVE TRANSESOPHAGEAL ECHOCARDIOGRAM (N/A)  Patient Location: ICU  Anesthesia Type:General  Level of Consciousness: sedated  Airway and Oxygen Therapy: Patient remains intubated per anesthesia plan  Post-op Pain: none  Post-op Assessment: Post-op Vital signs reviewed, Patient's Cardiovascular Status Stable and Respiratory Function Stable  Post-op Vital Signs: Reviewed and stable  Complications: No apparent anesthesia complications

## 2012-11-17 NOTE — Progress Notes (Signed)
UR Completed.  Kortney Schoenfelder Jane [phone] 11/17/2012  

## 2012-11-17 NOTE — Progress Notes (Addendum)
       [address]       [place] [address]             [phone]          6 Days Post-Op Procedure(s) (LRB): CORONARY ARTERY BYPASS GRAFTING times four using Right Greater Saphenous Vein Graft harvested endoscopically and Left Internal Mammary Artery. (N/A) INTRAOPERATIVE TRANSESOPHAGEAL ECHOCARDIOGRAM (N/A)  Subjective: "Do you think I can go home today?" No complaints.  Walking without difficulty, no more lightheadedness.   Objective: Vital signs in last 24 hours: Patient Vitals for the past 24 hrs:  BP Temp Temp src Pulse Resp SpO2  11/17/12 0423 116/67 mmHg 97.9 F (36.6 C) Oral 78 18 92 %  11/16/12 2142 - - - 85 - -  11/16/12 1944 125/57 mmHg 99.5 F (37.5 C) Oral 58 18 97 %  11/16/12 1429 145/68 mmHg 98.8 F (37.1 C) Oral 86 18 94 %  11/16/12 1008 110/52 mmHg - - - - -   Current Weight  11/16/12 190 lb 1.6 oz (86.229 kg)  PRE OP WEIGHT: 79 kg   Intake/Output from previous day: 08/31 0701 - 09/01 0700 In: 600 [P.O.:600] Out: 1050 [Urine:1050]    PHYSICAL EXAM:  Heart: RRR Lungs: Clear Wound: Clean and dry Extremities: Mild RLE edema    Lab Results: CBC: Recent Labs  11/16/12 0420 11/17/12 0410  WBC 7.6 7.5  HGB 8.9* 9.1*  HCT 26.0* 26.0*  PLT 193 259   BMET:  Recent Labs  11/16/12 0420 11/17/12 0410  NA 136 137  K 3.6 3.7  CL 104 103  CO2 23 25  GLUCOSE 123* 104*  BUN 29* 26*  CREATININE 1.40* 1.39*  CALCIUM 8.5 8.6    PT/INR: No results found for this basename: LABPROT, INR,  in the last 72 hours    Assessment/Plan: S/P Procedure(s) (LRB): CORONARY ARTERY BYPASS GRAFTING times four using Right Greater Saphenous Vein Graft harvested endoscopically and Left Internal Mammary Artery. (N/A) INTRAOPERATIVE TRANSESOPHAGEAL ECHOCARDIOGRAM (N/A) Pt stable and doing well.  BPs better.  Cr stable. Plan d/c home today- instructions reviewed with patient.   LOS: 10 days    COLLINS,GINA H 11/17/2012  I have seen  and examined the patient and agree with the assessment and plan as outlined.  OWEN,CLARENCE H 11/17/2012 10:44 AM

## 2012-11-17 NOTE — Discharge Summary (Signed)
[address]       [place] [address]             [phone]              Discharge Summary  Name: Jon Reid DOB: 10/29/1928 79 y.o. MRN: [id number]   Admission Date: 11/07/2012 Discharge Date:     Admitting Diagnosis: Chest pain   Discharge Diagnosis:  Severe 3 vessel coronary artery disease Expected postoperative blood loss anemia  Past Medical History  Diagnosis Date  . Unspecified essential hypertension   . Electrocardiogram abnormal   . Renal insufficiency   . Sleep apnea     Questionable  . Carcinoma of prostate   . Shortness of breath     with exercise "  . GERD (gastroesophageal reflux disease)   . Gout      Procedures: CORONARY ARTERY BYPASS GRAFTING x 4 (Left internal mammary artery to left anterior descending, saphenous vein graft to obtuse marginal 1, sequential saphenous vein graft to posterior descending and posterolateral arteries) ENDOSCOPIC VEIN HARVEST RIGHT LEG  -11/11/2012   HPI:  The patient is a 79 y.o. male who recently presented to the [facility] Cardiology office for evaluation of chest pain.  He describes new onset, progressive exertional dyspnea with associated chest tightness over the last 3-4 weeks. His symptoms are promptly relieved within 5 minutes, with rest. He denies any rest angina. It was felt that he would benefit from cardiac catheterization to further delineate his coronary anatomy.  This was performed on 11/07/2012 and revealed severe 3 vessel coronary artery disease, including 80-90% LAD, 95% circumflex, 90% stenosis of the PDA, 90% stenosis of OM1, with normal LVEF. He was admitted following catheterization for further workup.     Hospital Course:  The patient was admitted to [facility] on 11/07/2012. His anatomy was felt to be unfavorable for percutaneous intervention, and a cardiac surgery consult was requested.  Dr. Donata Clay saw the patient and reviewed his films.  He agreed with the need for  surgical revascularization.  All risks, benefits and alternatives of surgery were explained in detail, and the patient agreed to proceed. He remained stable and pain free on heparin prior to surgery.  The patient was taken to the operating room on 11/11/2012 and underwent CABG as described above.    The postoperative course was notable for hypotension in the early course requiring pressor agents.  A 2D echocardiogram was performed on 11/13/2012 which showed normal LV function.  He underwent a transfusion for symptomatic anemia and was started on iron and folate.  Since then, his blood pressures have improved and he was weaned from all pressors.    He was transferred to stepdown on postop day 4.  He was started on a beta blocker, but his systolic blood pressures have been too low to start an ACE-I.  He has had a mild renal insufficiency, which has been treated conservatively and is improved.  The patient is ambulating in the halls and tolerating a regular diet.  His incisions are healing well.  He has been mildly volume overloaded, and was started on Lasix, to which he is responding well.  He is medically stable on today's date for discharge home.     Recent vital signs:  Filed Vitals:   11/17/12 1027  BP: 124/62  Pulse: 87  Temp:   Resp:     Recent laboratory studies:  CBC: Recent Labs  11/16/12 0420 11/17/12 0410  WBC 7.6 7.5  HGB 8.9* 9.1*  HCT 26.0* 26.0*  PLT 193 259   BMET:  Recent Labs  11/16/12 0420 11/17/12 0410  NA 136 137  K 3.6 3.7  CL 104 103  CO2 23 25  GLUCOSE 123* 104*  BUN 29* 26*  CREATININE 1.40* 1.39*  CALCIUM 8.5 8.6    PT/INR: No results found for this basename: LABPROT, INR,  in the last 72 hours   Discharge Medications:     Medication List    STOP taking these medications       nitroGLYCERIN 0.4 MG SL tablet  Commonly known as:  NITROSTAT     olmesartan-hydrochlorothiazide 40-12.5 MG per tablet  Commonly known as:  BENICAR HCT        TAKE these medications       allopurinol 100 MG tablet  Commonly known as:  ZYLOPRIM  Take 1 tablet (100 mg total) by mouth daily.     aspirin 325 MG EC tablet  Take 1 tablet (325 mg total) by mouth daily.     atorvastatin 80 MG tablet  Commonly known as:  LIPITOR  Take 1 tablet (80 mg total) by mouth daily.     cholecalciferol 1000 UNITS tablet  Commonly known as:  VITAMIN D  Take 2,000 Units by mouth daily.     colchicine 0.6 MG tablet  Take 1 tablet (0.6 mg total) by mouth daily.     cyanocobalamin 1000 MCG/ML injection  Commonly known as:  (VITAMIN B-12)  Inject 1,000 mcg into the muscle every 30 (thirty) days.     DULoxetine 30 MG capsule  Commonly known as:  CYMBALTA  Take 30 mg by mouth 3 (three) times a week.     ferrous fumarate 325 (106 FE) MG Tabs tablet  Commonly known as:  HEMOCYTE - 106 mg FE  Take 1 tablet (106 mg of iron total) by mouth 2 (two) times daily.     folic acid 1 MG tablet  Commonly known as:  FOLVITE  Take 1 tablet (1 mg total) by mouth daily.     furosemide 40 MG tablet  Commonly known as:  LASIX  Take 1 tablet (40 mg total) by mouth daily. X 1 week     metoprolol succinate 25 MG 24 hr tablet  Commonly known as:  TOPROL-XL  Take 1 tablet (25 mg total) by mouth daily.     potassium chloride SA 20 MEQ tablet  Commonly known as:  K-DUR,KLOR-CON  Take 1 tablet (20 mEq total) by mouth daily. X 1 week     traMADol 50 MG tablet  Commonly known as:  ULTRAM  Take 1 tablet (50 mg total) by mouth every 6 (six) hours as needed for pain.         Discharge Instructions:  The patient is to refrain from driving, heavy lifting or strenuous activity.  May shower daily and clean incisions with soap and water.  May resume regular diet.   Follow Up:       Future Appointments Provider Department Dept Phone   11/24/2012 8:30 AM [facility]-W[facility]m Nurse [facility] [phone]   03/03/2013 9:30 AM Ernestina Penna, MD [facility] [phone]      Follow-up Information   Follow up with VAN Dinah Beers, MD In 3 weeks. (Office will contact you with an appointment)    Specialty:  Cardiothoracic Surgery   Contact information:   [address]  [place] [address] [phone]       Follow up with Rollene Rotunda, MD. Schedule an appointment as soon as possible for a visit in 2 weeks.   Specialty:  Cardiology   Contact information:   [address] [place] [address] [phone]       The patient has been discharged on:  1.Beta Blocker: Yes [x  ]  No [ ]   If No, reason:    2.Ace Inhibitor/ARB: Yes [ ]   No [x  ]  If No, reason: Mild renal insufficiency, hypotension   3.Statin: Yes [x  ]  No [ ]   If No, reason:    4.Ecasa: Yes [x  ]  No [ ]   If No, reason:    COLLINS,GINA H 11/17/2012, 10:32 AM

## 2012-11-19 NOTE — Discharge Summary (Signed)
patient examined and medical record reviewed,agree with above note. VAN TRIGT III,PETER 11/19/2012   

## 2012-11-24 ENCOUNTER — Ambulatory Visit (INDEPENDENT_AMBULATORY_CARE_PROVIDER_SITE_OTHER): Payer: MEDICARE | Admitting: *Deleted

## 2012-11-24 DIAGNOSIS — E538 Deficiency of other specified B group vitamins: Secondary | ICD-10-CM | POA: Diagnosis not present

## 2012-12-08 ENCOUNTER — Other Ambulatory Visit: Payer: Self-pay | Admitting: *Deleted

## 2012-12-08 DIAGNOSIS — I2510 Atherosclerotic heart disease of native coronary artery without angina pectoris: Secondary | ICD-10-CM

## 2012-12-10 ENCOUNTER — Ambulatory Visit (INDEPENDENT_AMBULATORY_CARE_PROVIDER_SITE_OTHER): Payer: MEDICARE | Admitting: Physician Assistant

## 2012-12-10 ENCOUNTER — Ambulatory Visit
Admission: RE | Admit: 2012-12-10 | Discharge: 2012-12-10 | Disposition: A | Discharge status: Home / Self Care | Payer: MEDICARE | Source: Ambulatory Visit | Attending: Surgery | Admitting: Surgery

## 2012-12-10 ENCOUNTER — Other Ambulatory Visit: Payer: Self-pay | Admitting: *Deleted

## 2012-12-10 ENCOUNTER — Telehealth: Payer: Self-pay | Admitting: *Deleted

## 2012-12-10 ENCOUNTER — Ambulatory Visit (INDEPENDENT_AMBULATORY_CARE_PROVIDER_SITE_OTHER): Payer: Self-pay | Admitting: Cardiothoracic Surgery

## 2012-12-10 ENCOUNTER — Encounter: Payer: Self-pay | Admitting: Physician Assistant

## 2012-12-10 ENCOUNTER — Encounter: Payer: Self-pay | Admitting: Cardiothoracic Surgery

## 2012-12-10 VITALS — BP 138/72 | Ht 70.0 in | Wt 178.0 lb

## 2012-12-10 VITALS — BP 142/62 | HR 82 | Ht 69.5 in | Wt 180.0 lb

## 2012-12-10 DIAGNOSIS — J449 Chronic obstructive pulmonary disease, unspecified: Secondary | ICD-10-CM | POA: Diagnosis not present

## 2012-12-10 DIAGNOSIS — E785 Hyperlipidemia, unspecified: Secondary | ICD-10-CM

## 2012-12-10 DIAGNOSIS — R609 Edema, unspecified: Secondary | ICD-10-CM

## 2012-12-10 DIAGNOSIS — Z951 Presence of aortocoronary bypass graft: Secondary | ICD-10-CM

## 2012-12-10 DIAGNOSIS — I2510 Atherosclerotic heart disease of native coronary artery without angina pectoris: Secondary | ICD-10-CM | POA: Diagnosis not present

## 2012-12-10 DIAGNOSIS — I10 Essential (primary) hypertension: Secondary | ICD-10-CM

## 2012-12-10 LAB — BASIC METABOLIC PANEL
BUN: 16 mg/dL (ref 6–23)
CO2: 26 mEq/L (ref 19–32)
Glucose, Bld: 131 mg/dL — ABNORMAL HIGH (ref 70–99)
Potassium: 3.7 mEq/L (ref 3.5–5.1)
Sodium: 139 mEq/L (ref 135–145)

## 2012-12-10 MED ORDER — METOPROLOL SUCCINATE ER 25 MG PO TB24: 25.0000 mg | ORAL_TABLET | Freq: Every day | ORAL | Status: DC

## 2012-12-10 MED ORDER — FUROSEMIDE 40 MG PO TABS: 40.0000 mg | ORAL_TABLET | Freq: Every day | ORAL | Status: DC

## 2012-12-10 NOTE — Progress Notes (Signed)
PCP is Rudi Heap, MD Referring Provider is Nahser, Deloris Ping, MD  Chief Complaint  Patient presents with  . Routine Post Op    3 week f/u from surgery with CXR, S/P CABG x 4 on 11/11/12    HPI: 79 year old male returns for routine followup after CABG x4 and unstable angina. The patient maintained sinus rhythm postoperatively. He was discharged home on iron and folate because of mild postoperative anemia. He denies any recurrent angina. He is walking daily. The surgical incisions are healing. He has persistent mild to moderate edema of his right leg from the saphenous vein harvest. He plans on home rehabilitation with daily walks instead of traveling to the hospital for outpatient cardiac rehabilitation.  Past Medical History  Diagnosis Date  . Unspecified essential hypertension   . Electrocardiogram abnormal   . Renal insufficiency   . Sleep apnea     Questionable  . Carcinoma of prostate   . Shortness of breath     with exercise "  . GERD (gastroesophageal reflux disease)   . Gout     Past Surgical History  Procedure Laterality Date  . Retropubic prostatectomy      Radical  . Lymph node dissection      Bilateral pelvic  . Cardiac catheterization  11/07/2012    Dr Elease Hashimoto  . Coronary artery bypass graft N/A 11/11/2012    Procedure: CORONARY ARTERY BYPASS GRAFTING times four using Right Greater Saphenous Vein Graft harvested endoscopically and Left Internal Mammary Artery.;  Surgeon: Kerin Perna, MD;  Location: [facility] OR;  Service: Open Heart Surgery;  Laterality: N/A;  . Intraoperative transesophageal echocardiogram N/A 11/11/2012    Procedure: INTRAOPERATIVE TRANSESOPHAGEAL ECHOCARDIOGRAM;  Surgeon: Kerin Perna, MD;  Location: [facility] OR;  Service: Open Heart Surgery;  Laterality: N/A;    Family History  Problem Relation Age of Onset  . Aneurysm Father     Cerebral  . Coronary artery disease Neg Hx     Social History History  Substance Use Topics  . Smoking status:  Never Smoker   . Smokeless tobacco: Never Used  . Alcohol Use: No    Current Outpatient Prescriptions  Medication Sig Dispense Refill  . allopurinol (ZYLOPRIM) 100 MG tablet Take 1 tablet (100 mg total) by mouth daily.  30 tablet  6  . aspirin EC 325 MG EC tablet Take 1 tablet (325 mg total) by mouth daily.  30 tablet  0  . atorvastatin (LIPITOR) 80 MG tablet Take 1 tablet (80 mg total) by mouth daily.  30 tablet  1  . cholecalciferol (VITAMIN D) 1000 UNITS tablet Take 2,000 Units by mouth daily.      . cyanocobalamin (,VITAMIN B-12,) 1000 MCG/ML injection Inject 1,000 mcg into the muscle every 30 (thirty) days.      . ferrous fumarate (HEMOCYTE - 106 MG FE) 325 (106 FE) MG TABS tablet Take 1 tablet (106 mg of iron total) by mouth 2 (two) times daily.  60 tablet  0  . folic acid (FOLVITE) 1 MG tablet Take 1 tablet (1 mg total) by mouth daily.  30 tablet  0  . metoprolol succinate (TOPROL-XL) 25 MG 24 hr tablet Take 1 tablet (25 mg total) by mouth daily.  30 tablet  6   No current facility-administered medications for this visit.    No Known Allergies  Review of Systems improved exercise tolerance, no significant incisional pain. Persistent mild to moderate edema of right leg  Ht 5\' 10"  (1.778 m)  Wt 178 lb (80.74 kg)  BMI 25.54 kg/m2  SpO2 % Physical Exam Alert and pleasant Breath sounds clear and equal Sternal incision well-healed without instability or tenderness Cardiac rhythm regular without murmur or gallop Right leg with mild to moderate edema to the knee area. Surgical incisions healed well inthe right leg.  Diagnostic Tests: Chest x-ray with normal cardiac silhouette, small left pleural effusion with blunting of the costal-phrenic angle  Impression: Doing well 3 weeks postop CABG x4.  Plan: Patient can resume driving in light activities and may lift up to 15 pounds until 3 months after surgery. He needs another one to 2 weeks of oral Lasix for his lower extremity edema  and was provided with prescription. He will stop taking his iron and folic acid the prescription runs out. He was instructed to remain on aspirin and Lipitor indefinitely. Now that his blood pressure he has improved he could be started back on a beta blocker per his cardiologist. Return here as needed for surgical followup.

## 2012-12-10 NOTE — Progress Notes (Signed)
[address] [place], [place]  [address] Phone: [phone] Fax:  [phone]  Date:  12/10/2012   ID:  Jon Reid, DOB [birth year], MRN [id number]  PCP:  Rudi Heap, MD  Cardiologist:  Dr. Rollene Rotunda        History of Present Illness: Jon Reid is a 79 y.o. male who returns for follow up after a recent admission to the hospital.   He was seen recently in our [place] office with complaints of exertional chest pain and associated dyspnea. He was set up for cardiac catheterization which demonstrated severe 3 vessel CAD and normal LV function. He was referred for CABG. He underwent CABG x4 with Dr. Zenaida Niece Trigt (LIMA-LAD, SVG-OM1, SVG-PDA). Postoperative course was compensated by blood loss anemia requiring transfusion with PRBCs.  Echocardiogram 11/13/12: EF 55-60%, normal wall motion, biatrial enlargement, mild AI, mild TR, no pericardial effusion. Blood pressures remained too low to initiate ACE inhibitor.  Carotid US 11/08/12: Bilateral 1-39%.  He is doing well.  The patient denies chest pain, shortness of breath, syncope, orthopnea, PND.  He still has LE edema, worse on the right (SVG harvested).  He saw Dr. Donata Clay today to f/u.  He was placed back on Lasix for 1-2 weeks.  Labs (8/14):   K 3.7, creatinine 1.39, ALT 14, HDL 53, LDL 66, Hgb 9.1  Wt Readings from Last 3 Encounters:  12/10/12 178 lb (80.74 kg)  11/17/12 189 lb 3.2 oz (85.821 kg)  11/17/12 189 lb 3.2 oz (85.821 kg)     Past Medical History  Diagnosis Date  . HTN (hypertension)   . CKD (chronic kidney disease) stage 2, GFR 60-89 ml/min   . Sleep apnea     Questionable  . Carcinoma of prostate   . GERD (gastroesophageal reflux disease)   . Gout   . Coronary artery disease     a. LHC (8/14):  3v CAD => s/p CABG (LIMA-LAD, SVG-OM1, SVG-PDA)  . Hx of echocardiogram     Echocardiogram 11/13/12: EF 55-60%, normal wall motion, biatrial enlargement, mild AI, mild TR, no pericardial effusion     Current Outpatient Prescriptions  Medication Sig Dispense Refill  . allopurinol (ZYLOPRIM) 100 MG tablet Take 1 tablet (100 mg total) by mouth daily.  30 tablet  6  . aspirin EC 325 MG EC tablet Take 1 tablet (325 mg total) by mouth daily.  30 tablet  0  . atorvastatin (LIPITOR) 80 MG tablet Take 1 tablet (80 mg total) by mouth daily.  30 tablet  1  . cholecalciferol (VITAMIN D) 1000 UNITS tablet Take 2,000 Units by mouth daily.      . cyanocobalamin (,VITAMIN B-12,) 1000 MCG/ML injection Inject 1,000 mcg into the muscle every 30 (thirty) days.      . ferrous fumarate (HEMOCYTE - 106 MG FE) 325 (106 FE) MG TABS tablet Take 1 tablet (106 mg of iron total) by mouth 2 (two) times daily.  60 tablet  0  . folic acid (FOLVITE) 1 MG tablet Take 1 tablet (1 mg total) by mouth daily.  30 tablet  0  . metoprolol succinate (TOPROL-XL) 25 MG 24 hr tablet Take 1 tablet (25 mg total) by mouth daily.  30 tablet  6   No current facility-administered medications for this visit.    Allergies:   No Known Allergies  Social History:  The patient  reports that he has never smoked. He has never used smokeless tobacco. He reports that  he does not drink alcohol or use illicit drugs.   ROS:  Please see the history of present illness.     All other systems reviewed and negative.   PHYSICAL EXAM: VS:  BP 142/62  Pulse 82  Ht 5' 9.5" (1.765 m)  Wt 180 lb (81.647 kg)  BMI 26.21 kg/m2 Well nourished, well developed, in no acute distress HEENT: normal Neck: no JVD Cardiac:  normal S1, S2; RRR; 1-2/6 systolic murmur at the LLSB/apex Lungs:  clear to auscultation bilaterally, no wheezing, rhonchi or rales Abd: soft, nontender, no hepatomegaly Ext: no edema Skin: warm and dry Neuro:  CNs 2-12 intact, no focal abnormalities noted  EKG:  NSR, HR 76, NSSTTW changes     ASSESSMENT AND PLAN:  1. CAD:  He is doing very well since his CABG.  He is not interested in cardiac rehab.  Continue ASA, statin, beta  blocker.  He would like to follow up in [place]. 2. Hypertension:  Fair control.  He did not get Toprol refilled.  Restart Toprol XL 25 mg QD.  Check a bmet today.  3. Hyperlipidemia:  Continue high dose statin.  Check Lipids and LFTs in 6 weeks.   4. Disposition:  Follow up with Dr. Rollene Rotunda in [place] in 6 weeks.   Signed, Tereso Newcomer, PA-C  12/10/2012 2:06 PM

## 2012-12-10 NOTE — Patient Instructions (Addendum)
START TOPROL XL 25 MG DAILY; RX SENT IN TODAY  LAB TODAY; BMET  FASTING LIPID AND LIVER PANEL TO BE DONE IN 6 WEEKS AT DR. MOORE'S OFFICE; YOU HAVE BEEN GIVEN AN RX TO TAKE TO DR. Christell Constant FOR LAB WORK  PLEASE FOLLOW UP WITH DR. HOCHREIN IN ABOUT 6 WEEKS IN THE [place] OFFICE

## 2012-12-10 NOTE — Telephone Encounter (Signed)
pt's wife has been notified about pt's lab results with verbal understanding

## 2012-12-24 ENCOUNTER — Encounter: Payer: Self-pay | Admitting: Cardiology

## 2012-12-29 ENCOUNTER — Ambulatory Visit (INDEPENDENT_AMBULATORY_CARE_PROVIDER_SITE_OTHER): Payer: MEDICARE | Admitting: *Deleted

## 2012-12-29 ENCOUNTER — Other Ambulatory Visit: Payer: Self-pay | Admitting: *Deleted

## 2012-12-29 ENCOUNTER — Other Ambulatory Visit: Payer: MEDICARE

## 2012-12-29 ENCOUNTER — Encounter (INDEPENDENT_AMBULATORY_CARE_PROVIDER_SITE_OTHER): Payer: Self-pay

## 2012-12-29 DIAGNOSIS — Z23 Encounter for immunization: Secondary | ICD-10-CM

## 2012-12-29 DIAGNOSIS — E538 Deficiency of other specified B group vitamins: Secondary | ICD-10-CM | POA: Diagnosis not present

## 2012-12-29 DIAGNOSIS — M79609 Pain in unspecified limb: Secondary | ICD-10-CM

## 2012-12-29 MED ORDER — CYANOCOBALAMIN 1000 MCG/ML IJ SOLN
1000.0000 ug | INTRAMUSCULAR | Status: AC
  Administered 2012-12-29 – 2013-06-10 (×6): 1000 ug via INTRAMUSCULAR

## 2012-12-29 NOTE — Progress Notes (Signed)
Flu shot given also pt tolerated well

## 2012-12-29 NOTE — Patient Instructions (Signed)

## 2012-12-29 NOTE — Progress Notes (Signed)
Pt tolerated well

## 2012-12-30 LAB — URIC ACID: Uric Acid: 6.8 mg/dL (ref 3.7–8.6)

## 2012-12-31 ENCOUNTER — Ambulatory Visit (HOSPITAL_COMMUNITY)
Admission: RE | Admit: 2012-12-31 | Discharge: 2012-12-31 | Disposition: A | Discharge status: Home / Self Care | Payer: MEDICARE | Source: Ambulatory Visit | Attending: Family Medicine | Admitting: Family Medicine

## 2012-12-31 ENCOUNTER — Other Ambulatory Visit: Payer: Self-pay | Admitting: *Deleted

## 2012-12-31 DIAGNOSIS — M79609 Pain in unspecified limb: Secondary | ICD-10-CM | POA: Insufficient documentation

## 2012-12-31 DIAGNOSIS — R609 Edema, unspecified: Secondary | ICD-10-CM | POA: Diagnosis not present

## 2012-12-31 DIAGNOSIS — M7989 Other specified soft tissue disorders: Secondary | ICD-10-CM | POA: Diagnosis not present

## 2013-01-06 ENCOUNTER — Ambulatory Visit (INDEPENDENT_AMBULATORY_CARE_PROVIDER_SITE_OTHER): Payer: MEDICARE

## 2013-01-06 ENCOUNTER — Ambulatory Visit (INDEPENDENT_AMBULATORY_CARE_PROVIDER_SITE_OTHER): Payer: MEDICARE | Admitting: Family Medicine

## 2013-01-06 ENCOUNTER — Encounter: Payer: Self-pay | Admitting: Family Medicine

## 2013-01-06 VITALS — BP 157/72 | HR 67 | Temp 97.3°F | Ht 69.5 in | Wt 183.4 lb

## 2013-01-06 DIAGNOSIS — R7989 Other specified abnormal findings of blood chemistry: Secondary | ICD-10-CM | POA: Diagnosis not present

## 2013-01-06 DIAGNOSIS — R609 Edema, unspecified: Secondary | ICD-10-CM | POA: Diagnosis not present

## 2013-01-06 DIAGNOSIS — M79609 Pain in unspecified limb: Secondary | ICD-10-CM

## 2013-01-06 LAB — POCT CBC
Lymph, poc: 2.7 (ref 0.6–3.4)
MCH, POC: 32.4 pg — AB (ref 27–31.2)
MCHC: 33 g/dL (ref 31.8–35.4)
MCV: 98.3 fL — AB (ref 80–97)
Platelet Count, POC: 415 10*3/uL (ref 142–424)
RDW, POC: 15.3 %
WBC: 8.1 10*3/uL (ref 4.6–10.2)

## 2013-01-06 NOTE — Patient Instructions (Addendum)
Edema Edema is an abnormal build-up of fluids in tissues. Because this is partly dependent on gravity (water flows to the lowest place), it is more common in the legs and thighs (lower extremities). It is also common in the looser tissues, like around the eyes. Painless swelling of the feet and ankles is common and increases as a person ages. It may affect both legs and may include the calves or even thighs. When squeezed, the fluid may move out of the affected area and may leave a dent for a few moments. CAUSES   Prolonged standing or sitting in one place for extended periods of time. Movement helps pump tissue fluid into the veins, and absence of movement prevents this, resulting in edema.  Varicose veins. The valves in the veins do not work as well as they should. This causes fluid to leak into the tissues.  Fluid and salt overload.  Injury, burn, or surgery to the leg, ankle, or foot, may damage veins and allow fluid to leak out.  Sunburn damages vessels. Leaky vessels allow fluid to go out into the sunburned tissues.  Allergies (from insect bites or stings, medications or chemicals) cause swelling by allowing vessels to become leaky.  Protein in the blood helps keep fluid in your vessels. Low protein, as in malnutrition, allows fluid to leak out.  Hormonal changes, including pregnancy and menstruation, cause fluid retention. This fluid may leak out of vessels and cause edema.  Medications that cause fluid retention. Examples are sex hormones, blood pressure medications, steroid treatment, or anti-depressants.  Some illnesses cause edema, especially heart failure, kidney disease, or liver disease.  Surgery that cuts veins or lymph nodes, such as surgery done for the heart or for breast cancer, may result in edema. DIAGNOSIS  Your caregiver is usually easily able to determine what is causing your swelling (edema) by simply asking what is wrong (getting a history) and examining you (doing  a physical). Sometimes x-rays, EKG (electrocardiogram or heart tracing), and blood work may be done to evaluate for underlying medical illness. TREATMENT  General treatment includes:  Leg elevation (or elevation of the affected body part).  Restriction of fluid intake.  Prevention of fluid overload.  Compression of the affected body part. Compression with elastic bandages or support stockings squeezes the tissues, preventing fluid from entering and forcing it back into the blood vessels.  Diuretics (also called water pills or fluid pills) pull fluid out of your body in the form of increased urination. These are effective in reducing the swelling, but can have side effects and must be used only under your caregiver's supervision. Diuretics are appropriate only for some types of edema. The specific treatment can be directed at any underlying causes discovered. Heart, liver, or kidney disease should be treated appropriately. HOME CARE INSTRUCTIONS   Elevate the legs (or affected body part) above the level of the heart, while lying down.  Avoid sitting or standing still for prolonged periods of time.  Avoid putting anything directly under the knees when lying down, and do not wear constricting clothing or garters on the upper legs.  Exercising the legs causes the fluid to work back into the veins and lymphatic channels. This may help the swelling go down.  The pressure applied by elastic bandages or support stockings can help reduce ankle swelling.  A low-salt diet may help reduce fluid retention and decrease the ankle swelling.  Take any medications exactly as prescribed. SEEK MEDICAL CARE IF:  Your edema is   not responding to recommended treatments. SEEK IMMEDIATE MEDICAL CARE IF:   You develop shortness of breath or chest pain.  You cannot breathe when you lay down; or if, while lying down, you have to get up and go to the window to get your breath.  You are having increasing  swelling without relief from treatment.  You develop a fever over 102 F (38.9 C).  You develop pain or redness in the areas that are swollen.  Tell your caregiver right away if you have gained 3 lb/1.4 kg in 1 day or 5 lb/2.3 kg in a week. MAKE SURE YOU:   Understand these instructions.  Will watch your condition.  Will get help right away if you are not doing well or get worse. Document Released: 03/05/2005 Document Revised: 09/04/2011 Document Reviewed: 10/22/2007 [facility] Patient Information 2014 [place], [place].   Take Lasix or furosemide 40 mg one half by mouth on Monday Wednesday and Friday Continue to avoid sodium intake We will follow up on some additional lab work and will call you once the results are available We will also arrange for you to get some TED or support hose that you will put on the first thing when you awaken in the morning

## 2013-01-06 NOTE — Progress Notes (Signed)
Subjective:    Patient ID: Jon Reid, male    DOB: 1929-01-15, 79 y.o.   MRN: [id number]  HPI Patient here today for 1 week follow up on right lower leg pain and swelling. Patient continues to have right leg pain and edema. This is the leg where the vein graft was removed from for his bypass surgery. He indicates that the edema and swelling is much worse by the end of the day. Otherwise he is doing extremely well following his CABG surgery. He indicates that his blood pressures are running in the 110s and slightly above over the 60s. He has had no chest discomfort or shortness of breath. He did have venous Dopplers that were done and were negative for DVT.    Patient Active Problem List   Diagnosis Date Noted  . Myelodysplastic syndrome 11/06/2012  . Accelerating angina 11/06/2012  . Vitamin B 12 deficiency 07/07/2012  . Hyperlipemia 07/07/2012  . Macrocytosis without anemia 01/08/2012  . PVD 04/14/2009  . CARCINOMA, PROSTATE 04/13/2009  . VASCULAR PURPURA 04/13/2009  . Hypertension 04/13/2009  . RENAL INSUFFICIENCY 04/13/2009  . SLEEP APNEA 04/13/2009  . ELECTROCARDIOGRAM, ABNORMAL 04/13/2009   Outpatient Encounter Prescriptions as of 01/06/2013  Medication Sig Dispense Refill  . allopurinol (ZYLOPRIM) 100 MG tablet Take 1 tablet (100 mg total) by mouth daily.  30 tablet  6  . aspirin EC 325 MG EC tablet Take 1 tablet (325 mg total) by mouth daily.  30 tablet  0  . atorvastatin (LIPITOR) 80 MG tablet Take 1 tablet (80 mg total) by mouth daily.  30 tablet  1  . cholecalciferol (VITAMIN D) 1000 UNITS tablet Take 2,000 Units by mouth daily.      . ferrous fumarate (HEMOCYTE - 106 MG FE) 325 (106 FE) MG TABS tablet Take 1 tablet (106 mg of iron total) by mouth 2 (two) times daily.  60 tablet  0  . folic acid (FOLVITE) 1 MG tablet Take 1 tablet (1 mg total) by mouth daily.  30 tablet  0  . furosemide (LASIX) 40 MG tablet Take 1 tablet (40 mg total) by mouth daily.  30 tablet  0  .  metoprolol succinate (TOPROL-XL) 25 MG 24 hr tablet Take 1 tablet (25 mg total) by mouth daily.  30 tablet  11   Facility-Administered Encounter Medications as of 01/06/2013  Medication Dose Route Frequency Provider Last Rate Last Dose  . cyanocobalamin ((VITAMIN B-12)) injection 1,000 mcg  1,000 mcg Intramuscular Q30 days Ernestina Penna, MD   1,000 mcg at 12/29/12 9629    Review of Systems  Constitutional: Negative.   HENT: Negative.   Eyes: Negative.   Respiratory: Negative.  Negative for shortness of breath.   Cardiovascular: Positive for leg swelling. Negative for chest pain.  Gastrointestinal: Negative.   Endocrine: Negative.   Genitourinary: Negative.   Musculoskeletal: Negative.   Skin: Negative.   Allergic/Immunologic: Negative.   Neurological: Negative.   Hematological: Negative.   Psychiatric/Behavioral: Negative.        Objective:   Physical Exam  Nursing note and vitals reviewed. Constitutional: He is oriented to person, place, and time. He appears well-developed and well-nourished. No distress.  HENT:  Head: Normocephalic and atraumatic.  Eyes: Conjunctivae and EOM are normal. Right eye exhibits no discharge. Left eye exhibits no discharge. No scleral icterus.  Neck: Normal range of motion. Neck supple.  Cardiovascular: Normal rate, regular rhythm, normal heart sounds and intact distal pulses.  Exam reveals no gallop  and no friction rub.   No murmur heard. At 60 per minute  Pulmonary/Chest: Breath sounds normal. No respiratory distress. He has no wheezes. He has no rales. He exhibits no tenderness.  Abdominal: Soft. Bowel sounds are normal. There is no tenderness. There is no guarding.  Musculoskeletal: Normal range of motion. He exhibits no tenderness. Edema: 1+ pretibial bilaterally.  Lymphadenopathy:    He has no cervical adenopathy.  Neurological: He is alert and oriented to person, place, and time.  Skin: Skin is warm and dry. No rash noted. No erythema.   Psychiatric: He has a normal mood and affect. His behavior is normal. Judgment and thought content normal.   BP 157/72  Pulse 67  Temp(Src) 97.3 F (36.3 C) (Oral)  Ht 5' 9.5" (1.765 m)  Wt 187 lb (84.823 kg)  BMI 27.23 kg/m2  [facility] reading (PRIMARY) by  Dr.Rafe Mackowski; right foot;                                       Assessment & Plan:   1. Edema   2. Lower leg pain, right    3. CKD  Orders Placed This Encounter  Procedures  . DG Foot Complete Right    Standing Status: Future     Number of Occurrences:      Standing Expiration Date: 03/08/2014    Order Specific Question:  Reason for Exam (SYMPTOM  OR DIAGNOSIS REQUIRED)    Answer:  right lower leg swelling    Order Specific Question:  Preferred imaging location?    Answer:  Internal  . Brain natriuretic peptide  . Hepatic function panel  . Thyroid Panel With TSH  . Uric acid  . POCT CBC   No orders of the defined types were placed in this encounter.   Patient Instructions  Edema Edema is an abnormal build-up of fluids in tissues. Because this is partly dependent on gravity (water flows to the lowest place), it is more common in the legs and thighs (lower extremities). It is also common in the looser tissues, like around the eyes. Painless swelling of the feet and ankles is common and increases as a person ages. It may affect both legs and may include the calves or even thighs. When squeezed, the fluid may move out of the affected area and may leave a dent for a few moments. CAUSES   Prolonged standing or sitting in one place for extended periods of time. Movement helps pump tissue fluid into the veins, and absence of movement prevents this, resulting in edema.  Varicose veins. The valves in the veins do not work as well as they should. This causes fluid to leak into the tissues.  Fluid and salt overload.  Injury, burn, or surgery to the leg, ankle, or foot, may damage veins and allow fluid to leak out.  Sunburn  damages vessels. Leaky vessels allow fluid to go out into the sunburned tissues.  Allergies (from insect bites or stings, medications or chemicals) cause swelling by allowing vessels to become leaky.  Protein in the blood helps keep fluid in your vessels. Low protein, as in malnutrition, allows fluid to leak out.  Hormonal changes, including pregnancy and menstruation, cause fluid retention. This fluid may leak out of vessels and cause edema.  Medications that cause fluid retention. Examples are sex hormones, blood pressure medications, steroid treatment, or anti-depressants.  Some illnesses cause edema,  especially heart failure, kidney disease, or liver disease.  Surgery that cuts veins or lymph nodes, such as surgery done for the heart or for breast cancer, may result in edema. DIAGNOSIS  Your caregiver is usually easily able to determine what is causing your swelling (edema) by simply asking what is wrong (getting a history) and examining you (doing a physical). Sometimes x-rays, EKG (electrocardiogram or heart tracing), and blood work may be done to evaluate for underlying medical illness. TREATMENT  General treatment includes:  Leg elevation (or elevation of the affected body part).  Restriction of fluid intake.  Prevention of fluid overload.  Compression of the affected body part. Compression with elastic bandages or support stockings squeezes the tissues, preventing fluid from entering and forcing it back into the blood vessels.  Diuretics (also called water pills or fluid pills) pull fluid out of your body in the form of increased urination. These are effective in reducing the swelling, but can have side effects and must be used only under your caregiver's supervision. Diuretics are appropriate only for some types of edema. The specific treatment can be directed at any underlying causes discovered. Heart, liver, or kidney disease should be treated appropriately. HOME CARE  INSTRUCTIONS   Elevate the legs (or affected body part) above the level of the heart, while lying down.  Avoid sitting or standing still for prolonged periods of time.  Avoid putting anything directly under the knees when lying down, and do not wear constricting clothing or garters on the upper legs.  Exercising the legs causes the fluid to work back into the veins and lymphatic channels. This may help the swelling go down.  The pressure applied by elastic bandages or support stockings can help reduce ankle swelling.  A low-salt diet may help reduce fluid retention and decrease the ankle swelling.  Take any medications exactly as prescribed. SEEK MEDICAL CARE IF:  Your edema is not responding to recommended treatments. SEEK IMMEDIATE MEDICAL CARE IF:   You develop shortness of breath or chest pain.  You cannot breathe when you lay down; or if, while lying down, you have to get up and go to the window to get your breath.  You are having increasing swelling without relief from treatment.  You develop a fever over 102 F (38.9 C).  You develop pain or redness in the areas that are swollen.  Tell your caregiver right away if you have gained 3 lb/1.4 kg in 1 day or 5 lb/2.3 kg in a week. MAKE SURE YOU:   Understand these instructions.  Will watch your condition.  Will get help right away if you are not doing well or get worse. Document Released: 03/05/2005 Document Revised: 09/04/2011 Document Reviewed: 10/22/2007 [facility] Patient Information 2014 [place], [place].   Take Lasix or furosemide 40 mg one half by mouth on Monday Wednesday and Friday Continue to avoid sodium intake We will follow up on some additional lab work and will call you once the results are available We will also arrange for you to get some TED or support hose that you will put on the first thing when you awaken in the morning   Nyra Capes MD

## 2013-01-07 ENCOUNTER — Other Ambulatory Visit: Payer: Self-pay | Admitting: *Deleted

## 2013-01-07 DIAGNOSIS — R609 Edema, unspecified: Secondary | ICD-10-CM

## 2013-01-07 LAB — THYROID PANEL WITH TSH
Free Thyroxine Index: 1.3 (ref 1.2–4.9)
T3 Uptake Ratio: 29 % (ref 24–39)
T4, Total: 4.5 ug/dL (ref 4.5–12.0)
TSH: 2 u[IU]/mL (ref 0.450–4.500)

## 2013-01-07 LAB — HEPATIC FUNCTION PANEL
ALT: 14 IU/L (ref 0–44)
AST: 19 IU/L (ref 0–40)
Alkaline Phosphatase: 89 IU/L (ref 39–117)

## 2013-01-07 LAB — IRON AND TIBC
Iron Saturation: 21 % (ref 15–55)
Iron: 50 ug/dL (ref 40–155)
TIBC: 235 ug/dL — ABNORMAL LOW (ref 250–450)

## 2013-01-07 LAB — SPECIMEN STATUS REPORT

## 2013-01-07 LAB — FERRITIN: Ferritin: 130 ng/mL (ref 30–400)

## 2013-01-13 ENCOUNTER — Telehealth: Payer: Self-pay | Admitting: Family Medicine

## 2013-01-20 DIAGNOSIS — M109 Gout, unspecified: Secondary | ICD-10-CM | POA: Diagnosis not present

## 2013-01-20 DIAGNOSIS — M779 Enthesopathy, unspecified: Secondary | ICD-10-CM | POA: Diagnosis not present

## 2013-01-20 DIAGNOSIS — M79609 Pain in unspecified limb: Secondary | ICD-10-CM | POA: Diagnosis not present

## 2013-01-28 ENCOUNTER — Ambulatory Visit (INDEPENDENT_AMBULATORY_CARE_PROVIDER_SITE_OTHER): Payer: MEDICARE | Admitting: Cardiology

## 2013-01-28 ENCOUNTER — Encounter: Payer: Self-pay | Admitting: Cardiology

## 2013-01-28 VITALS — BP 129/71 | HR 73 | Ht 69.5 in | Wt 186.0 lb

## 2013-01-28 DIAGNOSIS — I739 Peripheral vascular disease, unspecified: Secondary | ICD-10-CM

## 2013-01-28 MED ORDER — ATORVASTATIN CALCIUM 20 MG PO TABS: 20.0000 mg | ORAL_TABLET | Freq: Every day | ORAL | Status: DC

## 2013-01-28 NOTE — Progress Notes (Signed)
HPI The the patient had recent dyspnea and chest discomfort. Catheterization demonstrated severe 3 vessel CAD and normal LV function. He was referred for CABG. He underwent CABG x4 with Dr. Zenaida Niece Trigt (LIMA-LAD, SVG-OM1, SVG-PDA). Postoperative course was complicated by blood loss anemia requiring transfusion with PRBCs. Echocardiogram 11/13/12 demonstrated an EF 55-60%.  Carotid US 11/08/12 demonstrated bilateral 1-39% stenosis.  Since the surgery was done relatively well. He's been doing his walking. He denies any cardiovascular symptoms such as chest pressure, neck or arm discomfort. He denies any palpitations, presyncope or syncope. He's had no PND or orthopnea. He continues to have some mild right lower extremity swelling. Of note the medicines that ran out after 30 days he has stopped. This has included Lipitor.   No Known Allergies  Current Outpatient Prescriptions  Medication Sig Dispense Refill  . aspirin EC 325 MG EC tablet Take 1 tablet (325 mg total) by mouth daily.  30 tablet  0  . furosemide (LASIX) 40 MG tablet Take 40 mg by mouth. Take one three times a week      . metoprolol succinate (TOPROL-XL) 25 MG 24 hr tablet Take 1 tablet (25 mg total) by mouth daily.  30 tablet  11  . cholecalciferol (VITAMIN D) 1000 UNITS tablet Take 2,000 Units by mouth daily.       Current Facility-Administered Medications  Medication Dose Route Frequency Provider Last Rate Last Dose  . cyanocobalamin ((VITAMIN B-12)) injection 1,000 mcg  1,000 mcg Intramuscular Q30 days Ernestina Penna, MD   1,000 mcg at 12/29/12 1478    Past Medical History  Diagnosis Date  . HTN (hypertension)   . CKD (chronic kidney disease) stage 2, GFR 60-89 ml/min   . Sleep apnea     Questionable  . Carcinoma of prostate   . GERD (gastroesophageal reflux disease)   . Gout   . Coronary artery disease     a. LHC (8/14):  3v CAD => s/p CABG (LIMA-LAD, SVG-OM1, SVG-PDA)  . Hx of echocardiogram     Echocardiogram  11/13/12: EF 55-60%, normal wall motion, biatrial enlargement, mild AI, mild TR, no pericardial effusion    Past Surgical History  Procedure Laterality Date  . Retropubic prostatectomy      Radical  . Lymph node dissection      Bilateral pelvic  . Cardiac catheterization  11/07/2012    Dr Elease Hashimoto  . Coronary artery bypass graft N/A 11/11/2012    Procedure: CORONARY ARTERY BYPASS GRAFTING times four using Right Greater Saphenous Vein Graft harvested endoscopically and Left Internal Mammary Artery.;  Surgeon: Kerin Perna, MD;  Location: [facility] OR;  Service: Open Heart Surgery;  Laterality: N/A;  . Intraoperative transesophageal echocardiogram N/A 11/11/2012    Procedure: INTRAOPERATIVE TRANSESOPHAGEAL ECHOCARDIOGRAM;  Surgeon: Kerin Perna, MD;  Location[facility] OR;  Service: Open Heart Surgery;  Laterality: N/A;    ROS:  As stated in the HPI and negative for all other systems.  PHYSICAL EXAM BP 129/71  Pulse 73  Ht 5' 9.5" (1.765 m)  Wt 186 lb (84.369 kg)  BMI 27.08 kg/m2 GENERAL:  Well appearing HEENT:  Pupils equal round and reactive, fundi not visualized, oral mucosa unremarkable NECK:  No jugular venous distention, waveform within normal limits, carotid upstroke brisk and symmetric, no bruits, no thyromegaly LYMPHATICS:  No cervical, inguinal adenopathy LUNGS:  Clear to auscultation bilaterally BACK:  No CVA tenderness CHEST:  Well healed sternotomy scar. HEART:  PMI not displaced or sustained,S1 and  S2 within normal limits, no S3, no S4, no clicks, no rubs, apical systolic murmur early peaking, no diastolic murmurs ABD:  Flat, positive bowel sounds normal in frequency in pitch, no bruits, no rebound, no guarding, no midline pulsatile mass, no hepatomegaly, no splenomegaly EXT:  2 plus pulses throughout, mild right leg edema, no cyanosis no clubbing   ASSESSMENT AND PLAN  CAD:  The patient has no new sypmtoms.  No further cardiovascular testing is indicated.  We will continue  with aggressive risk reduction and meds as listed.  DYSLIPIDEMIA:  His lipids actually look pretty good in the hospital. I don't think he needs the 80 mg of Lipitor and he was discharged on that he should be on at least 20 mg. He will then follow up with Dr. Christell Constant for future lipid profiles. I did review the hospital records and labs.  CAROTID STENOSIS:  This was mild and will be followed in a couple of years with repeat Dopplers.  HTN:  The blood pressure is at target. No change in medications is indicated. We will continue with therapeutic lifestyle changes (TLC).

## 2013-01-28 NOTE — Patient Instructions (Signed)
Please start Atorvastatin 20 mg a day. Continue all other medications as listed.  Follow up in 6 months with Dr Antoine Poche.  You will receive a letter in the mail 2 months before you are due.  Please call us when you receive this letter to schedule your follow up appointment.

## 2013-02-02 ENCOUNTER — Ambulatory Visit (INDEPENDENT_AMBULATORY_CARE_PROVIDER_SITE_OTHER): Payer: MEDICARE | Admitting: Family Medicine

## 2013-02-02 ENCOUNTER — Encounter: Payer: Self-pay | Admitting: Family Medicine

## 2013-02-02 VITALS — BP 142/58 | HR 65 | Temp 97.5°F | Ht 69.5 in | Wt 181.2 lb

## 2013-02-02 DIAGNOSIS — Z951 Presence of aortocoronary bypass graft: Secondary | ICD-10-CM

## 2013-02-02 DIAGNOSIS — E538 Deficiency of other specified B group vitamins: Secondary | ICD-10-CM

## 2013-02-02 DIAGNOSIS — C61 Malignant neoplasm of prostate: Secondary | ICD-10-CM | POA: Diagnosis not present

## 2013-02-02 DIAGNOSIS — I2510 Atherosclerotic heart disease of native coronary artery without angina pectoris: Secondary | ICD-10-CM

## 2013-02-02 DIAGNOSIS — E785 Hyperlipidemia, unspecified: Secondary | ICD-10-CM

## 2013-02-02 DIAGNOSIS — I10 Essential (primary) hypertension: Secondary | ICD-10-CM | POA: Diagnosis not present

## 2013-02-02 HISTORY — DX: Presence of aortocoronary bypass graft: Z95.1

## 2013-02-02 NOTE — Progress Notes (Signed)
  Subjective:    Patient ID: Jon Reid, male    DOB: 04-05-1928, 79 y.o.   MRN: [id number]  HPI Patient comes in today for followup of the edema and the right leg where the bypass veins were grafted from. The edema is doing better. The patient is walking regularly. He has no complaints. He is getting his energy back. He recently saw the cardiologist. A return visit was scheduled with the cardiologist in 6 months. Please see that note.   Review of Systems  Constitutional: Negative for activity change and fatigue.  HENT: Negative for congestion, postnasal drip and sore throat.   Eyes: Negative.   Respiratory: Negative for cough, shortness of breath and wheezing.   Cardiovascular: Positive for leg swelling (R foot, less edema).  Gastrointestinal: Negative.   Endocrine: Negative.   Genitourinary: Negative.   Allergic/Immunologic: Negative.   Neurological: Negative for dizziness and headaches.  Hematological: Negative.   Psychiatric/Behavioral: Negative.        Objective:   Physical Exam  Nursing note and vitals reviewed. Constitutional: He is oriented to person, place, and time. He appears well-developed and well-nourished. No distress.  HENT:  Head: Normocephalic and atraumatic.  Right Ear: External ear normal.  Left Ear: External ear normal.  Nose: Nose normal.  Mouth/Throat: Oropharynx is clear and moist. No oropharyngeal exudate.  Eyes: Conjunctivae and EOM are normal. Pupils are equal, round, and reactive to light. Right eye exhibits no discharge. Left eye exhibits no discharge. No scleral icterus.  Neck: Normal range of motion. Neck supple. No thyromegaly present.  Cardiovascular: Normal rate, regular rhythm, normal heart sounds and intact distal pulses.  Exam reveals no gallop and no friction rub.   No murmur heard. At 72 per minute  Pulmonary/Chest: Effort normal and breath sounds normal. No respiratory distress. He has no wheezes. He has no rales. He exhibits no  tenderness.  Abdominal: Soft. Bowel sounds are normal. He exhibits no mass. There is no tenderness. There is no rebound and no guarding.  Musculoskeletal: Normal range of motion. He exhibits edema. He exhibits no tenderness.  Slight edema right lower leg and ankle  Lymphadenopathy:    He has no cervical adenopathy.  Neurological: He is alert and oriented to person, place, and time. No cranial nerve deficit.  Skin: Skin is warm and dry. No rash noted. No erythema. No pallor.  Psychiatric: He has a normal mood and affect. His behavior is normal. Judgment and thought content normal.   BP 142/58  Pulse 65  Temp(Src) 97.5 F (36.4 C) (Oral)  Ht 5' 9.5" (1.765 m)  Wt 181 lb 3.2 oz (82.192 kg)  BMI 26.38 kg/m2        Assessment & Plan:    1. Hypertension   2. Hyperlipemia   3. CARCINOMA, PROSTATE   4. Coronary artery disease   5. S/P CABG x 4    No orders of the defined types were placed in this encounter.   Patient Instructions  Continue support hose Continue aggressive therapeutic lifestyle changes which include diet and exercise Take medications regularly Always be careful and do not put yourself at risk for falling Watch his sodium intake    Nyra Capes MD

## 2013-02-02 NOTE — Patient Instructions (Signed)
Continue support hose Continue aggressive therapeutic lifestyle changes which include diet and exercise Take medications regularly Always be careful and do not put yourself at risk for falling Watch his sodium intake

## 2013-03-03 ENCOUNTER — Ambulatory Visit: Payer: MEDICARE | Admitting: Family Medicine

## 2013-03-05 ENCOUNTER — Ambulatory Visit (INDEPENDENT_AMBULATORY_CARE_PROVIDER_SITE_OTHER): Payer: MEDICARE | Admitting: *Deleted

## 2013-03-05 DIAGNOSIS — E538 Deficiency of other specified B group vitamins: Secondary | ICD-10-CM

## 2013-03-16 ENCOUNTER — Ambulatory Visit (INDEPENDENT_AMBULATORY_CARE_PROVIDER_SITE_OTHER): Payer: MEDICARE | Admitting: Family Medicine

## 2013-03-16 ENCOUNTER — Encounter: Payer: Self-pay | Admitting: *Deleted

## 2013-03-16 ENCOUNTER — Ambulatory Visit (INDEPENDENT_AMBULATORY_CARE_PROVIDER_SITE_OTHER): Payer: MEDICARE

## 2013-03-16 ENCOUNTER — Encounter: Payer: Self-pay | Admitting: Family Medicine

## 2013-03-16 VITALS — BP 165/83 | HR 84 | Temp 97.5°F | Ht 69.5 in | Wt 184.0 lb

## 2013-03-16 DIAGNOSIS — I10 Essential (primary) hypertension: Secondary | ICD-10-CM

## 2013-03-16 DIAGNOSIS — I2510 Atherosclerotic heart disease of native coronary artery without angina pectoris: Secondary | ICD-10-CM | POA: Diagnosis not present

## 2013-03-16 DIAGNOSIS — E785 Hyperlipidemia, unspecified: Secondary | ICD-10-CM

## 2013-03-16 DIAGNOSIS — R002 Palpitations: Secondary | ICD-10-CM

## 2013-03-16 DIAGNOSIS — N259 Disorder resulting from impaired renal tubular function, unspecified: Secondary | ICD-10-CM

## 2013-03-16 DIAGNOSIS — Z951 Presence of aortocoronary bypass graft: Secondary | ICD-10-CM

## 2013-03-16 DIAGNOSIS — N63 Unspecified lump in unspecified breast: Secondary | ICD-10-CM | POA: Diagnosis not present

## 2013-03-16 NOTE — Progress Notes (Signed)
Dr [place] please review pt's recent EKGs that were done 03-16-13 in our office. They appear different than the last.

## 2013-03-16 NOTE — Patient Instructions (Addendum)
Continue current medications. Continue good therapeutic lifestyle changes which include good diet and exercise. Fall precautions discussed with patient. We will arrange for an ultrasound and bilateral mammograms to evaluate these breast lumps We will discuss with the clinical pharmacist any medication which could be playing a role in making your breast tender We will call you with the results of the lab work and a chest x-ray once these reports are returned.

## 2013-03-16 NOTE — Progress Notes (Signed)
I reviewed the previous EKG side by side with the one today.  There is a slight axis change and some widening of the QRS.  However, I see nothing diagnostic.

## 2013-03-16 NOTE — Progress Notes (Signed)
Subjective:    Patient ID: Jon Reid, male    DOB: 04/06/1928, 79 y.o.   MRN: [id number]  HPI Patient here today for knots and soreness in breasts and palpataions. Patient comes in this morning with complaints of shortness of breath. Patient also notes a pounding sensation in his chest at night. He saw the cardiologist about 2 weeks ago and indicates that that visit went well and everything was good at that time. He complains of breast soreness for about one week and works in his breast for 2-3 days.      Patient Active Problem List   Diagnosis Date Noted  . Coronary artery disease 02/02/2013  . 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 Myelodysplastic syndrome 11/06/2012  . Accelerating angina 11/06/2012  . Vitamin B 12 deficiency 07/07/2012  . Hyperlipemia 07/07/2012  . Macrocytosis without anemia 01/08/2012  . PVD 04/14/2009  . CARCINOMA, PROSTATE 04/13/2009  . VASCULAR PURPURA 04/13/2009  . Hypertension 04/13/2009  . RENAL INSUFFICIENCY 04/13/2009  . SLEEP APNEA 04/13/2009  . ELECTROCARDIOGRAM, ABNORMAL 04/13/2009   Outpatient Encounter Prescriptions as of 03/16/2013  Medication Sig  . aspirin EC 325 MG EC tablet Take 1 tablet (325 mg total) by mouth daily.  Marland Kitchen atorvastatin (LIPITOR) 20 MG tablet Take 1 tablet (20 mg total) by mouth daily.  . cholecalciferol (VITAMIN D) 1000 UNITS tablet Take 2,000 Units by mouth daily.  . furosemide (LASIX) 40 MG tablet Take 40 mg by mouth. Take one three times a week  . metoprolol succinate (TOPROL-XL) 25 MG 24 hr tablet Take 1 tablet (25 mg total) by mouth daily.    Review of Systems  Constitutional: Negative.   HENT: Negative.   Eyes: Negative.   Respiratory: Negative.   Cardiovascular: Positive for palpitations.  Gastrointestinal: Negative.   Endocrine: Negative.   Genitourinary: Negative.   Musculoskeletal: Negative.   Skin: Negative.        Knots and soreness in breast  Allergic/Immunologic: Negative.   Neurological: Negative.     Hematological: Negative.   Psychiatric/Behavioral: Negative.        Objective:   Physical Exam  Nursing note and vitals reviewed. Constitutional: He is oriented to person, place, and time. He appears well-developed and well-nourished. No distress.  HENT:  Head: Normocephalic and atraumatic.  Right Ear: External ear normal.  Left Ear: External ear normal.  Nose: Nose normal.  Mouth/Throat: Oropharynx is clear and moist. No oropharyngeal exudate.  Eyes: Conjunctivae and EOM are normal. Pupils are equal, round, and reactive to light. Right eye exhibits no discharge. Left eye exhibits no discharge. No scleral icterus.  Neck: Normal range of motion. Neck supple. No thyromegaly present.  Cardiovascular: Normal rate, regular rhythm, normal heart sounds and intact distal pulses.  Exam reveals no gallop and no friction rub.   No murmur heard. At 72 per minute  Pulmonary/Chest: Effort normal and breath sounds normal. No respiratory distress. He has no wheezes. He has no rales. He exhibits no tenderness.  There were a lot of the areas in both breasts and both breasts were tender to palpation. There were no axillary nodes. There were no inguinal nodes.  Abdominal: Soft. Bowel sounds are normal. He exhibits no distension and no mass. There is no tenderness. There is no rebound and no guarding.  No inguinal nodes  Musculoskeletal: Normal range of motion. He exhibits edema (slight edema right lower leg about 1+). He exhibits no tenderness.  Lymphadenopathy:    He has no cervical adenopathy.  Neurological: He is alert and oriented to person, place, and time. No cranial nerve deficit.  Skin: Skin is warm and dry. No rash noted. No erythema. No pallor.  Psychiatric: He has a normal mood and affect. His behavior is normal. Judgment and thought content normal.   BP 165/83  Pulse 84  Temp(Src) 97.5 F (36.4 C) (Oral)  Ht 5' 9.5" (1.765 m)  Wt 184 lb (83.462 kg)  BMI 26.79 kg/m2  EKG: Incomplete  right bundle branch block and left anterior fascicular block, we will have Dr. Berna Bue review this and make sure there is no significant change that he needs to be aware of the cause of his increasing shortness of breath symptoms  [facility] reading (PRIMARY) by  Dr. Tracie Harrier x-ray--atherosclerotic aorta are, hiatal hernia, otherwise no active disease                                       Assessment & Plan:  1. Palpitations - EKG 12-Lead - DG Chest 2 View; Future - BMP8+EGFR - CBC with Differential  2. Breast mass in male - DG Chest 2 View; Future - BMP8+EGFR - Thyroid Panel With TSH - US Breast Bilateral; Future - MM Digital Diagnostic Bilat; Future - CBC with Differential  3. Coronary artery disease - CBC with Differential  4. Hyperlipemia - CBC with Differential  5. Hypertension - CBC with Differential  6. S/P CABG x 4  7. RENAL INSUFFICIENCY   Patient Instructions  Continue current medications. Continue good therapeutic lifestyle changes which include good diet and exercise. Fall precautions discussed with patient. We will arrange for an ultrasound and bilateral mammograms to evaluate these breast lumps We will discuss with the clinical pharmacist any medication which could be playing a role in making your breast tender We will call you with the results of the lab work and a chest x-ray once these reports are returned.   Nyra Capes MD

## 2013-03-17 LAB — BMP8+EGFR
BUN: 23 mg/dL (ref 8–27)
CO2: 22 mmol/L (ref 18–29)
Calcium: 9.4 mg/dL (ref 8.6–10.2)
Chloride: 102 mmol/L (ref 97–108)
Creatinine, Ser: 1.56 mg/dL — ABNORMAL HIGH (ref 0.76–1.27)
GFR calc non Af Amer: 41 mL/min/{1.73_m2} — ABNORMAL LOW (ref >59)
Glucose: 116 mg/dL — ABNORMAL HIGH (ref 65–99)
Sodium: 142 mmol/L (ref 134–144)

## 2013-03-17 LAB — CBC WITH DIFFERENTIAL/PLATELET
Basophils Absolute: 0 x10E3/uL (ref 0.0–0.2)
Basos: 1 %
Eos: 1 %
Eosinophils Absolute: 0.1 x10E3/uL (ref 0.0–0.4)
HCT: 32.8 % — ABNORMAL LOW (ref 37.5–51.0)
Hemoglobin: 10.9 g/dL — ABNORMAL LOW (ref 12.6–17.7)
Immature Grans (Abs): 0 x10E3/uL (ref 0.0–0.1)
Immature Granulocytes: 0 %
Lymphocytes Absolute: 2.3 x10E3/uL (ref 0.7–3.1)
Lymphs: 33 %
MCH: 31.9 pg (ref 26.6–33.0)
MCHC: 33.2 g/dL (ref 31.5–35.7)
MCV: 96 fL (ref 79–97)
Monocytes Absolute: 0.7 x10E3/uL (ref 0.1–0.9)
Monocytes: 10 %
Neutrophils Absolute: 3.8 x10E3/uL (ref 1.4–7.0)
Neutrophils Relative %: 55 %
RBC: 3.42 x10E6/uL — ABNORMAL LOW (ref 4.14–5.80)
RDW: 14.3 % (ref 12.3–15.4)
WBC: 7 x10E3/uL (ref 3.4–10.8)

## 2013-03-17 LAB — THYROID PANEL WITH TSH
Free Thyroxine Index: 1.4 (ref 1.2–4.9)
T3 Uptake Ratio: 31 % (ref 24–39)
T4, Total: 4.6 ug/dL (ref 4.5–12.0)
TSH: 1.91 u[IU]/mL (ref 0.450–4.500)

## 2013-03-20 ENCOUNTER — Other Ambulatory Visit: Payer: Self-pay | Admitting: Family Medicine

## 2013-03-20 DIAGNOSIS — R0789 Other chest pain: Secondary | ICD-10-CM

## 2013-03-20 MED ORDER — TRAMADOL HCL 50 MG PO TABS: ORAL_TABLET | ORAL | Status: DC

## 2013-03-23 ENCOUNTER — Ambulatory Visit (INDEPENDENT_AMBULATORY_CARE_PROVIDER_SITE_OTHER): Payer: MEDICARE | Admitting: Pharmacist

## 2013-03-23 VITALS — BP 142/62 | HR 68

## 2013-03-23 DIAGNOSIS — E785 Hyperlipidemia, unspecified: Secondary | ICD-10-CM | POA: Diagnosis not present

## 2013-03-23 DIAGNOSIS — N62 Hypertrophy of breast: Secondary | ICD-10-CM | POA: Diagnosis not present

## 2013-03-23 MED ORDER — ROSUVASTATIN CALCIUM 5 MG PO TABS: 5.0000 mg | ORAL_TABLET | Freq: Every day | ORAL | Status: DC

## 2013-03-23 NOTE — Patient Instructions (Signed)
Stop atorvastatin.  Start Crestor 5mg  1 tablet daily for cholesterol.

## 2013-03-23 NOTE — Progress Notes (Signed)
Chief Complaint  Patient presents with  . Medication Management    lump on breast / ? gynocomastia    Filed Vitals:   03/23/13 0936  BP: 142/62  Pulse: 68     HPI:  Patient seen by Dr Laurance Flatten 03/16/13 for chest tenderness and c/o of enlarged breast tissue.  Dr Laurance Flatten has ordered ultrasound of breast but testing has not been completed yet.  He is here today to review possible medication causes of tenderness/gynocomastia. In reviewing patient's PMH I did see that he has a history of prostate cancer with prostatectomy.  He never received any medical treatment for prostate cancer (some treatments for prostate CA can cause gynecomastia)  Patient also states that since he has been taking atorvastatin that he has not felt well - more muscle aches and pains.   Past Medical History  Diagnosis Date  . HTN (hypertension)   . CKD (chronic kidney disease) stage 2, GFR 60-89 ml/min   . Sleep apnea     Questionable  . Carcinoma of prostate   . GERD (gastroesophageal reflux disease)   . Gout   . Coronary artery disease     a. LHC (8/14):  3v CAD => s/p CABG (LIMA-LAD, SVG-OM1, SVG-PDA)  . Hx of echocardiogram     Echocardiogram 11/13/12: EF 55-60%, normal wall motion, biatrial enlargement, mild AI, mild TR, no pericardial effusion     Current Outpatient Prescriptions on File Prior to Visit  Medication Sig Dispense Refill  . aspirin EC 325 MG EC tablet Take 1 tablet (325 mg total) by mouth daily.  30 tablet  0  . atorvastatin (LIPITOR) 20 MG tablet Take 1 tablet (20 mg total) by mouth daily.  30 tablet  11  . cholecalciferol (VITAMIN D) 1000 UNITS tablet Take 2,000 Units by mouth daily.      . furosemide (LASIX) 40 MG tablet Take 40 mg by mouth. Take one three times a week      . traMADol (ULTRAM) 50 MG tablet 1 twice daily if needed for severe pain  28 tablet  0   Current Facility-Administered Medications on File Prior to Visit  Medication Dose Route Frequency Provider Last Rate Last Dose  .  cyanocobalamin ((VITAMIN B-12)) injection 1,000 mcg  1,000 mcg Intramuscular Q30 days Chipper Herb, MD   1,000 mcg at 03/05/13 0901    I have reviewed all lab results from 03/16/13 which are normal or stable.  Of particular interest is that TSH was WNL  Assessment: Breast tenderness and gynecomastia Hyperlipidemia (post CABG) - possibly side effect from atorvastatin.  Plan: Reviewed all current medications - only medication he is currently taking with any report of gynecomastia is atorvastatin which according to the literature this claim has no been significantly substantiated.  Will have labs listed below drawn today - pending Orders Placed This Encounter  Procedures  . FSH/LH  . Prolactin  . Testosterone,Free and Total  . Estrogens, Total   Change atorvastatin tp crestor 5mg  1 tablet daily - gave #21 samples  Cherre Robins, PharmD, CPP

## 2013-03-24 LAB — TESTOSTERONE,FREE AND TOTAL
Testosterone, Free: 6.6 pg/mL (ref 6.6–18.1)
Testosterone: 384 ng/dL (ref 348–1197)

## 2013-03-24 LAB — PROLACTIN: PROLACTIN: 9.4 ng/mL (ref 4.0–15.2)

## 2013-03-24 LAB — ESTROGENS, TOTAL: Estrogen: 168 pg/mL — ABNORMAL HIGH (ref 40–115)

## 2013-03-24 LAB — FSH/LH
FSH: 7.3 m[IU]/mL (ref 1.5–12.4)
LH: 5.7 m[IU]/mL (ref 1.7–8.6)

## 2013-03-30 ENCOUNTER — Telehealth: Payer: Self-pay | Admitting: Pharmacist

## 2013-03-30 NOTE — Telephone Encounter (Signed)
Get the ultrasound first, then make future referral

## 2013-03-30 NOTE — Telephone Encounter (Signed)
Patient notified of test results.   All were normal except estrogen was elevated.  There were no medications that might have lead to gynocomastia or elevated estrogen.  Patient has appt for U/S of breast for 04/08/13. Will forward results and note to his PCP for further recommendations.

## 2013-04-02 ENCOUNTER — Other Ambulatory Visit: Payer: Self-pay | Admitting: Cardiothoracic Surgery

## 2013-04-06 ENCOUNTER — Ambulatory Visit (INDEPENDENT_AMBULATORY_CARE_PROVIDER_SITE_OTHER): Payer: MEDICARE | Admitting: *Deleted

## 2013-04-06 DIAGNOSIS — E538 Deficiency of other specified B group vitamins: Secondary | ICD-10-CM | POA: Diagnosis not present

## 2013-04-06 MED ORDER — FUROSEMIDE 40 MG PO TABS: 40.0000 mg | ORAL_TABLET | Freq: Every day | ORAL | Status: DC

## 2013-04-06 NOTE — Progress Notes (Signed)
Patient ID: Jon Reid, male   DOB: 1928/06/30, 80 y.o.   MRN: [id number] Pt tolerated inj well

## 2013-04-08 ENCOUNTER — Ambulatory Visit (HOSPITAL_COMMUNITY)
Admission: RE | Admit: 2013-04-08 | Discharge: 2013-04-08 | Disposition: A | Discharge status: Home / Self Care | Payer: MEDICARE | Source: Ambulatory Visit | Attending: Family Medicine | Admitting: Family Medicine

## 2013-04-08 DIAGNOSIS — N63 Unspecified lump in unspecified breast: Secondary | ICD-10-CM

## 2013-04-08 DIAGNOSIS — Z1231 Encounter for screening mammogram for malignant neoplasm of breast: Secondary | ICD-10-CM | POA: Insufficient documentation

## 2013-04-08 DIAGNOSIS — R922 Inconclusive mammogram: Secondary | ICD-10-CM | POA: Diagnosis not present

## 2013-04-09 ENCOUNTER — Encounter: Payer: Self-pay | Admitting: Family Medicine

## 2013-04-09 ENCOUNTER — Ambulatory Visit (INDEPENDENT_AMBULATORY_CARE_PROVIDER_SITE_OTHER): Payer: MEDICARE | Admitting: Family Medicine

## 2013-04-09 VITALS — BP 161/60 | HR 68 | Temp 98.4°F | Ht 69.5 in | Wt 187.0 lb

## 2013-04-09 DIAGNOSIS — Z23 Encounter for immunization: Secondary | ICD-10-CM

## 2013-04-09 DIAGNOSIS — N62 Hypertrophy of breast: Secondary | ICD-10-CM

## 2013-04-09 DIAGNOSIS — E785 Hyperlipidemia, unspecified: Secondary | ICD-10-CM | POA: Diagnosis not present

## 2013-04-09 DIAGNOSIS — E039 Hypothyroidism, unspecified: Secondary | ICD-10-CM | POA: Diagnosis not present

## 2013-04-09 NOTE — Progress Notes (Signed)
   Subjective:    Patient ID: Jon Reid, male    DOB: 07-31-1928, 80 y.o.   MRN: [id number]  HPI patient here today for follow up from breast mass. Day for followup of gynecomastia. He recently and finally had a diagnostic ultrasound completed and this confirmed gynecomastia. The report was explained to the patient. Additional lab work will be checked. He is already seeing the pharmacist and no pharmacological reason was present for causing the gynecomastia. Following the blood work it was explained to him that we will probably have him see at least a week on a one time visit an endocrinologist.    Patient Active Problem List   Diagnosis Date Noted  . Coronary artery disease 02/02/2013  . 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 Myelodysplastic syndrome 11/06/2012  . Accelerating angina 11/06/2012  . Vitamin B 12 deficiency 07/07/2012  . Hyperlipemia 07/07/2012  . Macrocytosis without anemia 01/08/2012  . PVD 04/14/2009  . CARCINOMA, PROSTATE 04/13/2009  . VASCULAR PURPURA 04/13/2009  . Hypertension 04/13/2009  . RENAL INSUFFICIENCY 04/13/2009  . SLEEP APNEA 04/13/2009  . ELECTROCARDIOGRAM, ABNORMAL 04/13/2009   Outpatient Encounter Prescriptions as of 04/09/2013  Medication Sig  . allopurinol (ZYLOPRIM) 100 MG tablet Take 100 mg by mouth daily.  Marland Kitchen aspirin EC 325 MG EC tablet Take 1 tablet (325 mg total) by mouth daily.  Marland Kitchen atorvastatin (LIPITOR) 20 MG tablet Take 1 tablet (20 mg total) by mouth daily.  . cholecalciferol (VITAMIN D) 1000 UNITS tablet Take 2,000 Units by mouth daily.  . colchicine (COLCRYS) 0.6 MG tablet Take 0.6 mg by mouth as needed.  . furosemide (LASIX) 40 MG tablet Take 1 tablet (40 mg total) by mouth daily. Take one three times a week  . nitroGLYCERIN (NITROSTAT) 0.4 MG SL tablet Place 0.4 mg under the tongue every 5 (five) minutes as needed for chest pain.  . rosuvastatin (CRESTOR) 5 MG tablet Take 1 tablet (5 mg total) by mouth daily.  . traMADol (ULTRAM) 50 MG tablet  1 twice daily if needed for severe pain    Review of Systems The breasts remain sore but maybe less sore than when initially noted. He is also off of the atorvastatin and having less leg aches and pains. He is now taking Crestor without any problems. The patient is due to get his Prevnar.    Objective:   Physical Exam BP 161/60  Pulse 68  Temp(Src) 98.4 F (36.9 C) (Oral)  Ht 5' 9.5" (1.765 m)  Wt 187 lb (84.823 kg)  BMI 27.23 kg/m2 Both breasts were examined and are still tender to palpation,there is thickening and tenderness bilaterally. There is no axillary adenopathy. The patient is in good spirits and alert.       Assessment & Plan:  Gynecomastia, male - Plan: Prolactin, FSH/LH, Testosterone,Free and Total, Estrogens, Total, Thyroid Panel With TSH, Ambulatory referral to Endocrinology  Patient Instructions  Return to clinic in about 4 weeks for liver function tests since being on new statin drug, Crestor We will arrange an appointment for you to see an endocrinologist, Dr. Wilson Singer We will call you the results of the lab work that was done today once those results are available   Arrie Senate MD

## 2013-04-09 NOTE — Patient Instructions (Signed)
Return to clinic in about 4 weeks for liver function tests since being on new statin drug, Crestor We will arrange an appointment for you to see an endocrinologist, Dr. Wilson Singer We will call you the results of the lab work that was done today once those results are available

## 2013-04-09 NOTE — Addendum Note (Signed)
Addended by: Zannie Cove on: 04/09/2013 04:55 PM   Modules accepted: Orders

## 2013-04-12 LAB — PROLACTIN: Prolactin: 11.2 ng/mL (ref 4.0–15.2)

## 2013-04-12 LAB — ESTROGENS, TOTAL: Estrogen: 143 pg/mL — ABNORMAL HIGH (ref 40–115)

## 2013-04-12 LAB — FSH/LH
FSH: 8.5 m[IU]/mL (ref 1.5–12.4)
LH: 6.7 m[IU]/mL (ref 1.7–8.6)

## 2013-04-12 LAB — TESTOSTERONE,FREE AND TOTAL
TESTOSTERONE FREE: 3.4 pg/mL — AB (ref 6.6–18.1)
Testosterone: 239 ng/dL — ABNORMAL LOW (ref 348–1197)

## 2013-04-12 LAB — THYROID PANEL WITH TSH
FREE THYROXINE INDEX: 1.6 (ref 1.2–4.9)
T3 UPTAKE RATIO: 30 % (ref 24–39)
T4, Total: 5.4 ug/dL (ref 4.5–12.0)
TSH: 2.11 u[IU]/mL (ref 0.450–4.500)

## 2013-04-14 ENCOUNTER — Telehealth: Payer: Self-pay | Admitting: *Deleted

## 2013-04-15 NOTE — Telephone Encounter (Signed)
done

## 2013-04-24 ENCOUNTER — Telehealth: Payer: Self-pay | Admitting: Family Medicine

## 2013-04-27 ENCOUNTER — Telehealth: Payer: Self-pay | Admitting: Family Medicine

## 2013-04-27 ENCOUNTER — Other Ambulatory Visit: Payer: Self-pay | Admitting: Family Medicine

## 2013-04-27 MED ORDER — ROSUVASTATIN CALCIUM 5 MG PO TABS: 5.0000 mg | ORAL_TABLET | Freq: Every day | ORAL | Status: DC

## 2013-04-27 NOTE — Telephone Encounter (Signed)
Please give samples if we have any otherwise call prescription in for Crestor 5 mg one daily with refills #30

## 2013-04-27 NOTE — Telephone Encounter (Signed)
Called in.

## 2013-04-27 NOTE — Telephone Encounter (Signed)
No samples 

## 2013-04-27 NOTE — Telephone Encounter (Signed)
No samples please send rx in for crestor 5 mg

## 2013-04-27 NOTE — Telephone Encounter (Signed)
Patient aware.

## 2013-05-06 ENCOUNTER — Encounter: Payer: Self-pay | Admitting: Family Medicine

## 2013-05-06 ENCOUNTER — Ambulatory Visit (INDEPENDENT_AMBULATORY_CARE_PROVIDER_SITE_OTHER): Payer: MEDICARE | Admitting: Family Medicine

## 2013-05-06 VITALS — BP 177/75 | HR 77 | Temp 97.3°F | Ht 69.5 in | Wt 191.0 lb

## 2013-05-06 DIAGNOSIS — E785 Hyperlipidemia, unspecified: Secondary | ICD-10-CM

## 2013-05-06 DIAGNOSIS — I10 Essential (primary) hypertension: Secondary | ICD-10-CM | POA: Diagnosis not present

## 2013-05-06 DIAGNOSIS — N259 Disorder resulting from impaired renal tubular function, unspecified: Secondary | ICD-10-CM

## 2013-05-06 DIAGNOSIS — E559 Vitamin D deficiency, unspecified: Secondary | ICD-10-CM

## 2013-05-06 DIAGNOSIS — C61 Malignant neoplasm of prostate: Secondary | ICD-10-CM

## 2013-05-06 DIAGNOSIS — M2550 Pain in unspecified joint: Secondary | ICD-10-CM | POA: Diagnosis not present

## 2013-05-06 DIAGNOSIS — N62 Hypertrophy of breast: Secondary | ICD-10-CM

## 2013-05-06 DIAGNOSIS — L209 Atopic dermatitis, unspecified: Secondary | ICD-10-CM

## 2013-05-06 DIAGNOSIS — I2510 Atherosclerotic heart disease of native coronary artery without angina pectoris: Secondary | ICD-10-CM

## 2013-05-06 DIAGNOSIS — E538 Deficiency of other specified B group vitamins: Secondary | ICD-10-CM

## 2013-05-06 DIAGNOSIS — L2089 Other atopic dermatitis: Secondary | ICD-10-CM

## 2013-05-06 LAB — POCT CBC
Granulocyte percent: 69 %G (ref 37–80)
HCT, POC: 32.3 % — AB (ref 43.5–53.7)
Hemoglobin: 9.7 g/dL — AB (ref 14.1–18.1)
LYMPH, POC: 1.3 (ref 0.6–3.4)
MCH: 28.6 pg (ref 27–31.2)
MCHC: 30 g/dL — AB (ref 31.8–35.4)
MCV: 95.3 fL (ref 80–97)
MPV: 7 fL (ref 0–99.8)
PLATELET COUNT, POC: 282 10*3/uL (ref 142–424)
POC Granulocyte: 4.2 (ref 2–6.9)
POC LYMPH PERCENT: 21.2 %L (ref 10–50)
RBC: 3.4 M/uL — AB (ref 4.69–6.13)
RDW, POC: 15.3 %
WBC: 6.1 10*3/uL (ref 4.6–10.2)

## 2013-05-06 MED ORDER — TRIAMCINOLONE ACETONIDE 0.1 % EX CREA: 1.0000 "application " | TOPICAL_CREAM | Freq: Two times a day (BID) | CUTANEOUS | Status: DC

## 2013-05-06 NOTE — Patient Instructions (Addendum)
Continue current medications. Continue good therapeutic lifestyle changes which include good diet and exercise. Fall precautions discussed with patient. Schedule your flu vaccine if you haven't had it yet If you are over 78 years old - you may need Prevnar 33 or the adult Pneumonia vaccine. Keep your appointments with the cardiologist, the urologist, and the endocrinologist. Continue to watch  sodium intake Continue your B12 shots Return the FOBT We will call you with the results of the lab work once those results are unavailable Monitor blood pressures at home twice daily and bring readings for review in 2 weeks

## 2013-05-06 NOTE — Progress Notes (Signed)
Subjective:    Patient ID: Jon Reid, male    DOB: [birth year], 80 y.o.   MRN: [id number]  HPI Pt here for follow up and management of chronic medical problems. The patient has a history of multiple medical problems. These include status post CABG. B12 deficiency, vitamin D deficiency, hyperlipidemia, and prostate cancer. He is going to be seeing the endocrinologist in the coming week because of his gynecomastia. He does complain of arthralgias. He has a history of renal insufficiency. He will see the urologist in May of this year.          Patient Active Problem List   Diagnosis Date Noted  . Gynecomastia 04/09/2013  . Gynecomastia, male 04/09/2013  . Coronary artery disease 02/02/2013  . 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 Myelodysplastic syndrome 11/06/2012  . Accelerating angina 11/06/2012  . Vitamin B 12 deficiency 07/07/2012  . Hyperlipemia 07/07/2012  . Macrocytosis without anemia 01/08/2012  . PVD 04/14/2009  . CARCINOMA, PROSTATE 04/13/2009  . VASCULAR PURPURA 04/13/2009  . Hypertension 04/13/2009  . RENAL INSUFFICIENCY 04/13/2009  . SLEEP APNEA 04/13/2009  . ELECTROCARDIOGRAM, ABNORMAL 04/13/2009   Outpatient Encounter Prescriptions as of 05/06/2013  Medication Sig  . allopurinol (ZYLOPRIM) 100 MG tablet Take 100 mg by mouth daily.  Marland Kitchen aspirin EC 325 MG EC tablet Take 1 tablet (325 mg total) by mouth daily.  . cholecalciferol (VITAMIN D) 1000 UNITS tablet Take 2,000 Units by mouth daily.  . colchicine (COLCRYS) 0.6 MG tablet Take 0.6 mg by mouth as needed.  . furosemide (LASIX) 40 MG tablet Take 1 tablet (40 mg total) by mouth daily. Take one three times a week  . nitroGLYCERIN (NITROSTAT) 0.4 MG SL tablet Place 0.4 mg under the tongue every 5 (five) minutes as needed for chest pain.  . rosuvastatin (CRESTOR) 5 MG tablet Take 1 tablet (5 mg total) by mouth daily.  . traMADol (ULTRAM) 50 MG tablet 1 twice daily if needed for severe pain    Review of Systems    Constitutional: Negative.   HENT: Negative.   Eyes: Negative.   Respiratory: Negative.   Cardiovascular: Positive for leg swelling (bilateral).  Gastrointestinal: Negative.   Endocrine: Negative.        Swelling of breast tissue  Genitourinary: Negative.   Musculoskeletal: Positive for arthralgias (left arm swelling occasionally).  Skin: Negative.   Allergic/Immunologic: Negative.   Neurological: Negative.   Hematological: Negative.   Psychiatric/Behavioral: Negative.        Objective:   Physical Exam  Nursing note and vitals reviewed. Constitutional: He is oriented to person, place, and time. He appears well-developed and well-nourished. No distress.  HENT:  Head: Normocephalic and atraumatic.  Right Ear: External ear normal.  Left Ear: External ear normal.  Nose: Nose normal.  Mouth/Throat: Oropharynx is clear and moist. No oropharyngeal exudate.  Eyes: Conjunctivae and EOM are normal. Pupils are equal, round, and reactive to light. Right eye exhibits no discharge. Left eye exhibits no discharge. No scleral icterus.  Neck: Normal range of motion. Neck supple. No thyromegaly present.   Right supraclavicular bruit  Cardiovascular: Normal rate, regular rhythm and intact distal pulses.  Exam reveals no gallop and no friction rub.   Murmur heard. At 72 per minute, grade 2/6 systolic ejection murmur  Pulmonary/Chest: Effort normal and breath sounds normal. No respiratory distress. He has no wheezes. He has no rales. He exhibits no tenderness.  The patient still has bilateral breast tenderness and masses. There is  no axillary adenopathy.  Abdominal: Soft. Bowel sounds are normal. He exhibits no mass. There is no tenderness. There is no rebound and no guarding.  Musculoskeletal: Normal range of motion. He exhibits edema (1+ edema above socks). He exhibits no tenderness.  Lymphadenopathy:    He has no cervical adenopathy.  Neurological: He is alert and oriented to person, place,  and time. He has normal reflexes. No cranial nerve deficit.  Skin: Skin is warm and dry. No rash noted. No erythema. No pallor.  Atopic dermatitis of scalp  Psychiatric: He has a normal mood and affect. His behavior is normal. Judgment and thought content normal.   BP 177/75  Pulse 77  Temp(Src) 97.3 F (36.3 C) (Oral)  Ht 5' 9.5" (1.765 m)  Wt 191 lb (86.637 kg)  BMI 27.81 kg/m2  Repeat blood pressure 156/74 right arm sitting      Assessment & Plan:  1. Coronary artery disease - POCT CBC  2. Hyperlipemia - POCT CBC - NMR, lipoprofile  3. Hypertension - POCT CBC - Hepatic function panel - BMP8+EGFR  4. Vitamin D deficiency - Vit D  25 hydroxy (rtn osteoporosis monitoring)  5. CARCINOMA, PROSTATE  6. Gynecomastia -Keep appointment with endocrinologist  7. RENAL INSUFFICIENCY  8. Vitamin B 12 deficiency  9. Atopic dermatitis -Hydrocortisone cream was called in  Patient Instructions  Continue current medications. Continue good therapeutic lifestyle changes which include good diet and exercise. Fall precautions discussed with patient. Schedule your flu vaccine if you haven't had it yet If you are over 78 years old - you may need Prevnar 34 or the adult Pneumonia vaccine. Keep your appointments with the cardiologist, the urologist, and the endocrinologist. Continue to watch  sodium intake Continue your B12 shots Return the FOBT We will call you with the results of the lab work once those results are unavailable Monitor blood pressures at home twice daily and bring readings for review in 2 weeks    Arrie Senate MD

## 2013-05-07 ENCOUNTER — Telehealth: Payer: Self-pay | Admitting: Family Medicine

## 2013-05-07 LAB — NMR, LIPOPROFILE
Cholesterol: 119 mg/dL (ref <200)
HDL Cholesterol by NMR: 58 mg/dL (ref >=40)
HDL Particle Number: 31.8 umol/L (ref >=30.5)
LDL Particle Number: 445 nmol/L (ref <1000)
LDL Size: 20.2 nm — ABNORMAL LOW (ref >20.5)
LDLC SERPL CALC-MCNC: 47 mg/dL (ref <100)
LP-IR SCORE: 40 (ref <=45)
Small LDL Particle Number: 277 nmol/L (ref <=527)
Triglycerides by NMR: 68 mg/dL (ref <150)

## 2013-05-07 LAB — HEPATIC FUNCTION PANEL
ALBUMIN: 4.6 g/dL (ref 3.5–4.7)
ALK PHOS: 84 IU/L (ref 39–117)
ALT: 13 IU/L (ref 0–44)
AST: 22 IU/L (ref 0–40)
Bilirubin, Direct: 0.17 mg/dL (ref 0.00–0.40)
Total Bilirubin: 0.6 mg/dL (ref 0.0–1.2)
Total Protein: 7 g/dL (ref 6.0–8.5)

## 2013-05-07 LAB — BMP8+EGFR
BUN/Creatinine Ratio: 17 (ref 10–22)
BUN: 24 mg/dL (ref 8–27)
CALCIUM: 9.1 mg/dL (ref 8.6–10.2)
CO2: 22 mmol/L (ref 18–29)
Chloride: 103 mmol/L (ref 97–108)
Creatinine, Ser: 1.42 mg/dL — ABNORMAL HIGH (ref 0.76–1.27)
GFR calc Af Amer: 53 mL/min/{1.73_m2} — ABNORMAL LOW (ref >59)
GFR calc non Af Amer: 46 mL/min/{1.73_m2} — ABNORMAL LOW (ref >59)
GLUCOSE: 104 mg/dL — AB (ref 65–99)
POTASSIUM: 4 mmol/L (ref 3.5–5.2)
Sodium: 142 mmol/L (ref 134–144)

## 2013-05-07 LAB — VITAMIN D 25 HYDROXY (VIT D DEFICIENCY, FRACTURES): VIT D 25 HYDROXY: 43.4 ng/mL (ref 30.0–100.0)

## 2013-05-07 LAB — ARTHRITIS PANEL
Anti Nuclear Antibody(ANA): NEGATIVE
Rhuematoid fact SerPl-aCnc: 14.1 IU/mL — ABNORMAL HIGH (ref 0.0–13.9)
Sed Rate: 5 mm/hr (ref 0–30)
URIC ACID: 8.2 mg/dL (ref 3.7–8.6)

## 2013-05-07 NOTE — Telephone Encounter (Signed)
Message copied by Waverly Ferrari on Thu May 07, 2013 11:45 AM ------      Message from: Chipper Herb      Created: Wed May 06, 2013  1:03 PM       The CBC had a normal white blood cell count. The hemoglobin remains low at 9.7 and this is consistent with past readings.===== it is important to make sure that we check an FOBT, that he has seen a gastroenterologist, and that we find a reason for this low hemoglobin. If he has seen a gastroenterologist and that workup was negative, he needs to be sent to a hematologist if that has not already been done. This is very important+++++++++++++++ we should repeat the CBC in 2 weeks. ------

## 2013-05-07 NOTE — Telephone Encounter (Signed)
Pt notified of results He will return FOBT tomorrow and return for CBC in 2 weeks Verbalizes  understanding

## 2013-05-08 ENCOUNTER — Other Ambulatory Visit: Payer: MEDICARE

## 2013-05-08 ENCOUNTER — Ambulatory Visit (INDEPENDENT_AMBULATORY_CARE_PROVIDER_SITE_OTHER): Payer: MEDICARE | Admitting: *Deleted

## 2013-05-08 DIAGNOSIS — Z1212 Encounter for screening for malignant neoplasm of rectum: Secondary | ICD-10-CM | POA: Diagnosis not present

## 2013-05-08 DIAGNOSIS — E538 Deficiency of other specified B group vitamins: Secondary | ICD-10-CM | POA: Diagnosis not present

## 2013-05-08 NOTE — Progress Notes (Signed)
Pt came in for labs only 

## 2013-05-08 NOTE — Progress Notes (Signed)
Patient ID: Jon Reid, male   DOB: Jul 24, 1928, 80 y.o.   MRN: [id number] Vitamin b12 injection given and tolerated well

## 2013-05-08 NOTE — Patient Instructions (Signed)
Vitamin B12 Injections Every person needs vitamin B12. A deficiency develops when the body does not get enough of it. One way to overcome this is by getting B12 shots (injections). A B12 shot puts the vitamin directly into muscle tissue. This avoids any problems your body might have in absorbing it from food or a pill. In some people, the body has trouble using the vitamin correctly. This can cause a B12 deficiency. Not consuming enough of the vitamin can also cause a deficiency. Getting enough vitamin B12 can be hard for elderly people. Sometimes, they do not eat a well-balanced diet. The elderly are also more likely than younger people to have medical conditions or take medications that can lead to a deficiency. WHAT DOES VITAMIN B12 DO? Vitamin B12 does many things to help the body work right:  It helps the body make healthy red blood cells.  It helps maintain nerve cells.  It is involved in the body's process of converting food into energy (metabolism).  It is needed to make the genetic material in all cells (DNA). VITAMIN B12 FOOD SOURCES Most people get plenty of vitamin B12 through the foods they eat. It is present in:  Meat, fish, poultry, and eggs.  Milk and milk products.  It also is added when certain foods are made, including some breads, cereals and yogurts. The food is then called "fortified". CAUSES The most common causes of vitamin B12 deficiency are:  Pernicious anemia. The condition develops when the body cannot make enough healthy red blood cells. This stems from a lack of a protein made in the stomach (intrinsic factor). People without this protein cannot absorb enough vitamin B12 from food.  Malabsorption. This is when the body cannot absorb the vitamin. It can be caused by:  Pernicious anemia.  Surgery to remove part or all of the stomach can lead to malabsorption. Removal of part or all of the small intestine can also cause malabsorption.  Vegetarian diet.  People who are strict about not eating foods from animals could have trouble taking in enough vitamin B12 from diet alone.  Medications. Some medicines have been linked to B12 deficiency, such as Metformin (a drug prescribed for type 2 diabetes). Long-term use of stomach acid suppressants also can keep the vitamin from being absorbed.  Intestinal problems such as inflammatory bowel disease. If there are problems in the digestive tract, vitamin B12 may not be absorbed in good enough amounts. SYMPTOMS People who do not get enough B12 can develop problems. These can include:  Anemia. This is when the body has too few red blood cells. Red blood cells carry oxygen to the rest of the body. Without a healthy supply of red blood cells, people can feel:  Tired (fatigued).  Weak.  Severe anemia can cause:  Shortness of breath.  Dizziness.  Rapid heart rate.  Paleness.  Other Vitamin B12 deficiency symptoms include:  Diarrhea.  Numbness or tingling in the hands or feet.  Loss of appetite.  Confusion.  Sores on the tongue or in the mouth. LET YOUR CAREGIVER KNOW ABOUT:  Any allergies. It is very important to know if you are allergic or sensitive to cobalt. Vitamin B12 contains cobalt.  Any history of kidney disease.  All medications you are taking. Include prescription and over-the-counter medicines, herbs and creams.  Whether you are pregnant or breast-feeding.  If you have Leber's disease, a hereditary eye condition, vitamin B12 could make it worse. RISKS AND COMPLICATIONS Reactions to an injection are   usually temporary. They might include:  Pain at the injection site.  Redness, swelling or tenderness at the site.  Headache, dizziness or weakness.  Nausea, upset stomach or diarrhea.  Numbness or tingling.  Fever.  Joint pain.  Itching or rash. If a reaction does not go away in a short while, talk with your healthcare provider. A change in the way the shots are  given, or where they are given, might need to be made. BEFORE AN INJECTION To decide whether B12 injections are right for you, your healthcare provider will probably:  Ask about your medical history.  Ask questions about your diet.  Ask about symptoms such as:  Have you felt weak?  Do you feel unusually tired?  Do you get dizzy?  Order blood tests. These may include a test to:  Check the level of red cells in your blood.  Measure B12 levels.  Check for the presence of intrinsic factor. VITAMIN B12 INJECTIONS How often you will need a vitamin B12 injection will depend on how severe your deficiency is. This also will affect how long you will need to get them. People with pernicious anemia usually get injections for their entire life. Others might get them for a shorter period. For many people, injections are given daily or weekly for several weeks. Then, once B12 levels are normal, injections are given just once a month. If the cause of the deficiency can be fixed, the injections can be stopped. Talk with your healthcare provider about what you should expect. For an injection:  The injection site will be cleaned with an alcohol swab.  Your healthcare provider will insert a needle directly into a muscle. Most any muscle can be used. Most often, an arm muscle is used. A buttocks muscle can also be used. Many people say shots in that area are less painful.  A small adhesive bandage may be put over the injection site. It usually can be taken off in an hour or less. Injections can be given by your healthcare provider. In some cases, family members give them. Sometimes, people give them to themselves. Talk with your healthcare provider about what would be best for you. If someone other than your healthcare provider will be giving the shots, the person will need to be trained to give them correctly. HOME CARE INSTRUCTIONS   You can remove the adhesive bandage within an hour of getting a  shot.  You should be able to go about your normal activities right away.  Avoid drinking large amounts of alcohol while taking vitamin B12 shots. Alcohol can interfere with the body's use of the vitamin. SEEK MEDICAL CARE IF:   Pain, redness, swelling or tenderness at the injection site does not get better or gets worse.  Headache, dizziness or weakness does not go away.  You develop a fever of more than 100.5 F (38.1 C). SEEK IMMEDIATE MEDICAL CARE IF:   You have chest pain.  You develop shortness of breath.  You have muscle weakness that gets worse.  You develop numbness, weakness or tingling on one side or one area of the body.  You have symptoms of an allergic reaction, such as:  Hives.  Difficulty breathing.  Swelling of the lips, face, tongue or throat.  You develop a fever of more than 102.0 F (38.9 C). MAKE SURE YOU:   Understand these instructions.  Will watch your condition.  Will get help right away if you are not doing well or get worse. Document   Released: 06/01/2008 Document Revised: 05/28/2011 Document Reviewed: 06/01/2008 [employer] Patient Information 2014 [employer].  

## 2013-05-09 LAB — FECAL OCCULT BLOOD, IMMUNOCHEMICAL: Fecal Occult Bld: NEGATIVE

## 2013-05-11 DIAGNOSIS — N62 Hypertrophy of breast: Secondary | ICD-10-CM | POA: Diagnosis not present

## 2013-05-11 DIAGNOSIS — E291 Testicular hypofunction: Secondary | ICD-10-CM | POA: Diagnosis not present

## 2013-05-20 ENCOUNTER — Other Ambulatory Visit (INDEPENDENT_AMBULATORY_CARE_PROVIDER_SITE_OTHER): Payer: MEDICARE

## 2013-05-20 ENCOUNTER — Encounter: Payer: Self-pay | Admitting: *Deleted

## 2013-05-20 ENCOUNTER — Other Ambulatory Visit: Payer: Self-pay | Admitting: *Deleted

## 2013-05-20 DIAGNOSIS — D649 Anemia, unspecified: Secondary | ICD-10-CM

## 2013-05-20 LAB — POCT CBC
GRANULOCYTE PERCENT: 60.6 % (ref 37–80)
HCT, POC: 28.1 % — AB (ref 43.5–53.7)
Hemoglobin: 9.2 g/dL — AB (ref 14.1–18.1)
LYMPH, POC: 1.9 (ref 0.6–3.4)
MCH: 30.6 pg (ref 27–31.2)
MCHC: 32.6 g/dL (ref 31.8–35.4)
MCV: 93.9 fL (ref 80–97)
MPV: 6.2 fL (ref 0–99.8)
PLATELET COUNT, POC: 310 10*3/uL (ref 142–424)
POC GRANULOCYTE: 4 (ref 2–6.9)
POC LYMPH %: 29.4 % (ref 10–50)
RBC: 3 M/uL — AB (ref 4.69–6.13)
RDW, POC: 14.4 %
WBC: 6.6 10*3/uL (ref 4.6–10.2)

## 2013-05-20 NOTE — Progress Notes (Signed)
Pt came in for labs only 

## 2013-05-20 NOTE — Progress Notes (Signed)
Quick Note:  Copy of labs sent to patient ______ 

## 2013-06-10 ENCOUNTER — Ambulatory Visit (INDEPENDENT_AMBULATORY_CARE_PROVIDER_SITE_OTHER): Payer: MEDICARE | Admitting: *Deleted

## 2013-06-10 DIAGNOSIS — E538 Deficiency of other specified B group vitamins: Secondary | ICD-10-CM

## 2013-07-13 ENCOUNTER — Ambulatory Visit (INDEPENDENT_AMBULATORY_CARE_PROVIDER_SITE_OTHER): Payer: MEDICARE | Admitting: *Deleted

## 2013-07-13 DIAGNOSIS — E538 Deficiency of other specified B group vitamins: Secondary | ICD-10-CM

## 2013-07-13 MED ORDER — CYANOCOBALAMIN 1000 MCG/ML IJ SOLN
1000.0000 ug | INTRAMUSCULAR | Status: AC
  Administered 2013-07-13 – 2014-06-03 (×11): 1000 ug via INTRAMUSCULAR

## 2013-07-13 NOTE — Progress Notes (Signed)
Vitamin b12 injection given and tolerated well.  

## 2013-07-13 NOTE — Patient Instructions (Signed)
Vitamin B12 Injections Every person needs vitamin B12. A deficiency develops when the body does not get enough of it. One way to overcome this is by getting B12 shots (injections). A B12 shot puts the vitamin directly into muscle tissue. This avoids any problems your body might have in absorbing it from food or a pill. In some people, the body has trouble using the vitamin correctly. This can cause a B12 deficiency. Not consuming enough of the vitamin can also cause a deficiency. Getting enough vitamin B12 can be hard for elderly people. Sometimes, they do not eat a well-balanced diet. The elderly are also more likely than younger people to have medical conditions or take medications that can lead to a deficiency. WHAT DOES VITAMIN B12 DO? Vitamin B12 does many things to help the body work right:  It helps the body make healthy red blood cells.  It helps maintain nerve cells.  It is involved in the body's process of converting food into energy (metabolism).  It is needed to make the genetic material in all cells (DNA). VITAMIN B12 FOOD SOURCES Most people get plenty of vitamin B12 through the foods they eat. It is present in:  Meat, fish, poultry, and eggs.  Milk and milk products.  It also is added when certain foods are made, including some breads, cereals and yogurts. The food is then called "fortified". CAUSES The most common causes of vitamin B12 deficiency are:  Pernicious anemia. The condition develops when the body cannot make enough healthy red blood cells. This stems from a lack of a protein made in the stomach (intrinsic factor). People without this protein cannot absorb enough vitamin B12 from food.  Malabsorption. This is when the body cannot absorb the vitamin. It can be caused by:  Pernicious anemia.  Surgery to remove part or all of the stomach can lead to malabsorption. Removal of part or all of the small intestine can also cause malabsorption.  Vegetarian diet.  People who are strict about not eating foods from animals could have trouble taking in enough vitamin B12 from diet alone.  Medications. Some medicines have been linked to B12 deficiency, such as Metformin (a drug prescribed for type 2 diabetes). Long-term use of stomach acid suppressants also can keep the vitamin from being absorbed.  Intestinal problems such as inflammatory bowel disease. If there are problems in the digestive tract, vitamin B12 may not be absorbed in good enough amounts. SYMPTOMS People who do not get enough B12 can develop problems. These can include:  Anemia. This is when the body has too few red blood cells. Red blood cells carry oxygen to the rest of the body. Without a healthy supply of red blood cells, people can feel:  Tired (fatigued).  Weak.  Severe anemia can cause:  Shortness of breath.  Dizziness.  Rapid heart rate.  Paleness.  Other Vitamin B12 deficiency symptoms include:  Diarrhea.  Numbness or tingling in the hands or feet.  Loss of appetite.  Confusion.  Sores on the tongue or in the mouth. LET YOUR CAREGIVER KNOW ABOUT:  Any allergies. It is very important to know if you are allergic or sensitive to cobalt. Vitamin B12 contains cobalt.  Any history of kidney disease.  All medications you are taking. Include prescription and over-the-counter medicines, herbs and creams.  Whether you are pregnant or breast-feeding.  If you have Leber's disease, a hereditary eye condition, vitamin B12 could make it worse. RISKS AND COMPLICATIONS Reactions to an injection are   usually temporary. They might include:  Pain at the injection site.  Redness, swelling or tenderness at the site.  Headache, dizziness or weakness.  Nausea, upset stomach or diarrhea.  Numbness or tingling.  Fever.  Joint pain.  Itching or rash. If a reaction does not go away in a short while, talk with your healthcare provider. A change in the way the shots are  given, or where they are given, might need to be made. BEFORE AN INJECTION To decide whether B12 injections are right for you, your healthcare provider will probably:  Ask about your medical history.  Ask questions about your diet.  Ask about symptoms such as:  Have you felt weak?  Do you feel unusually tired?  Do you get dizzy?  Order blood tests. These may include a test to:  Check the level of red cells in your blood.  Measure B12 levels.  Check for the presence of intrinsic factor. VITAMIN B12 INJECTIONS How often you will need a vitamin B12 injection will depend on how severe your deficiency is. This also will affect how long you will need to get them. People with pernicious anemia usually get injections for their entire life. Others might get them for a shorter period. For many people, injections are given daily or weekly for several weeks. Then, once B12 levels are normal, injections are given just once a month. If the cause of the deficiency can be fixed, the injections can be stopped. Talk with your healthcare provider about what you should expect. For an injection:  The injection site will be cleaned with an alcohol swab.  Your healthcare provider will insert a needle directly into a muscle. Most any muscle can be used. Most often, an arm muscle is used. A buttocks muscle can also be used. Many people say shots in that area are less painful.  A small adhesive bandage may be put over the injection site. It usually can be taken off in an hour or less. Injections can be given by your healthcare provider. In some cases, family members give them. Sometimes, people give them to themselves. Talk with your healthcare provider about what would be best for you. If someone other than your healthcare provider will be giving the shots, the person will need to be trained to give them correctly. HOME CARE INSTRUCTIONS   You can remove the adhesive bandage within an hour of getting a  shot.  You should be able to go about your normal activities right away.  Avoid drinking large amounts of alcohol while taking vitamin B12 shots. Alcohol can interfere with the body's use of the vitamin. SEEK MEDICAL CARE IF:   Pain, redness, swelling or tenderness at the injection site does not get better or gets worse.  Headache, dizziness or weakness does not go away.  You develop a fever of more than 100.5 F (38.1 C). SEEK IMMEDIATE MEDICAL CARE IF:   You have chest pain.  You develop shortness of breath.  You have muscle weakness that gets worse.  You develop numbness, weakness or tingling on one side or one area of the body.  You have symptoms of an allergic reaction, such as:  Hives.  Difficulty breathing.  Swelling of the lips, face, tongue or throat.  You develop a fever of more than 102.0 F (38.9 C). MAKE SURE YOU:   Understand these instructions.  Will watch your condition.  Will get help right away if you are not doing well or get worse. Document   Released: 06/01/2008 Document Revised: 05/28/2011 Document Reviewed: 06/01/2008 [employer] Patient Information 2014 [employer].  

## 2013-07-29 ENCOUNTER — Encounter: Payer: Self-pay | Admitting: Cardiology

## 2013-07-29 ENCOUNTER — Ambulatory Visit (INDEPENDENT_AMBULATORY_CARE_PROVIDER_SITE_OTHER): Payer: MEDICARE | Admitting: Cardiology

## 2013-07-29 VITALS — BP 138/68 | HR 63 | Ht 72.0 in | Wt 185.0 lb

## 2013-07-29 DIAGNOSIS — I2510 Atherosclerotic heart disease of native coronary artery without angina pectoris: Secondary | ICD-10-CM | POA: Diagnosis not present

## 2013-07-29 DIAGNOSIS — I10 Essential (primary) hypertension: Secondary | ICD-10-CM | POA: Diagnosis not present

## 2013-07-29 DIAGNOSIS — I739 Peripheral vascular disease, unspecified: Secondary | ICD-10-CM

## 2013-07-29 NOTE — Patient Instructions (Signed)
The current medical regimen is effective;  continue present plan and medications.  Follow up in 1 year with Dr Hochrein.  You will receive a letter in the mail 2 months before you are due.  Please call us when you receive this letter to schedule your follow up appointment.  

## 2013-07-29 NOTE — Progress Notes (Signed)
HPI The the patient had recent dyspnea and chest discomfort. Catheterization demonstrated severe 3 vessel CAD and normal LV function. He was referred for CABG. He underwent CABG x4 with Dr. Lucianne Lei Trigt (LIMA-LAD, SVG-OM1, SVG-PDA). Postoperative course was complicated by blood loss anemia requiring transfusion with PRBCs. Echocardiogram 11/13/12 demonstrated an EF 55-60%.  Carotid US 11/08/12 demonstrated bilateral 1-39% stenosis.  Since the surgery was done relatively well. He's been active. He denies any cardiovascular symptoms such as chest pressure, neck or arm discomfort. He denies any palpitations, presyncope or syncope. He's had no PND or orthopnea. He continues to have some mild bilateral lower extremity swelling. His biggest concern has been gynecomastia.   No Known Allergies  Current Outpatient Prescriptions  Medication Sig Dispense Refill  . allopurinol (ZYLOPRIM) 100 MG tablet Take 100 mg by mouth daily.      Marland Kitchen aspirin EC 325 MG EC tablet Take 1 tablet (325 mg total) by mouth daily.  30 tablet  0  . cholecalciferol (VITAMIN D) 1000 UNITS tablet Take 2,000 Units by mouth daily.      . furosemide (LASIX) 40 MG tablet Take 1 tablet (40 mg total) by mouth daily. Take one three times a week  30 tablet  5  . metoprolol succinate (TOPROL-XL) 25 MG 24 hr tablet Take 25 mg by mouth daily.      . nitroGLYCERIN (NITROSTAT) 0.4 MG SL tablet Place 0.4 mg under the tongue every 5 (five) minutes as needed for chest pain.      . rosuvastatin (CRESTOR) 5 MG tablet Take 1 tablet (5 mg total) by mouth daily.  21 tablet  0  . triamcinolone cream (KENALOG) 0.1 % Apply 1 application topically 2 (two) times daily.  30 g  3  . colchicine (COLCRYS) 0.6 MG tablet Take 0.6 mg by mouth as needed.      . traMADol (ULTRAM) 50 MG tablet 1 twice daily if needed for severe pain  28 tablet  0   Current Facility-Administered Medications  Medication Dose Route Frequency Provider Last Rate Last Dose  .  cyanocobalamin ((VITAMIN B-12)) injection 1,000 mcg  1,000 mcg Intramuscular Q30 days Chipper Herb, MD   1,000 mcg at 07/13/13 1136    Past Medical History  Diagnosis Date  . HTN (hypertension)   . CKD (chronic kidney disease) stage 2, GFR 60-89 ml/min   . Sleep apnea     Questionable  . Carcinoma of prostate   . GERD (gastroesophageal reflux disease)   . Gout   . Coronary artery disease     a. LHC (8/14):  3v CAD => s/p CABG (LIMA-LAD, SVG-OM1, SVG-PDA)  . Hx of echocardiogram     Echocardiogram 11/13/12: EF 55-60%, normal wall motion, biatrial enlargement, mild AI, mild TR, no pericardial effusion    Past Surgical History  Procedure Laterality Date  . Retropubic prostatectomy      Radical  . Lymph node dissection      Bilateral pelvic  . Cardiac catheterization  11/07/2012    Dr Acie Fredrickson  . Coronary artery bypass graft N/A 11/11/2012    Procedure: CORONARY ARTERY BYPASS GRAFTING times four using Right Greater Saphenous Vein Graft harvested endoscopically and Left Internal Mammary Artery.;  Surgeon: Ivin Poot, MD;  Location: [facility];  Service: Open Heart Surgery;  Laterality: N/A;  . Intraoperative transesophageal echocardiogram N/A 11/11/2012    Procedure: INTRAOPERATIVE TRANSESOPHAGEAL ECHOCARDIOGRAM;  Surgeon: Ivin Poot, MD;  Location: [facility];  Service: Open Heart  Surgery;  Laterality: N/A;    ROS:  As stated in the HPI and negative for all other systems.  PHYSICAL EXAM BP 138/68  Pulse 63  Ht 6' (1.829 m)  Wt 185 lb (83.915 kg)  BMI 25.08 kg/m2 GENERAL:  Well appearing HEENT:  Pupils equal round and reactive, fundi not visualized, oral mucosa unremarkable NECK:  No jugular venous distention, waveform within normal limits, carotid upstroke brisk and symmetric, right bruits, no thyromegaly LYMPHATICS:  No cervical, inguinal adenopathy LUNGS:  Clear to auscultation bilaterally BACK:  No CVA tenderness CHEST:  Well healed sternotomy scar. Positive for  gynecomastia HEART:  PMI not displaced or sustained,S1 and S2 within normal limits, no S3, no S4, no clicks, no rubs, apical systolic murmur early peaking, no diastolic murmurs ABD:  Flat, positive bowel sounds normal in frequency in pitch, no bruits, no rebound, no guarding, no midline pulsatile mass, no hepatomegaly, no splenomegaly EXT:  2 plus pulses throughout, mild ankle edema, no cyanosis no clubbing  EKG:  Sinus rhythm, rate 66, axis within normal limits, intervals within normal limits, no acute ST-T wave changes. 07/29/2013  ASSESSMENT AND PLAN  CAD:  The patient has no new sypmtoms.  No further cardiovascular testing is indicated.  We will continue with aggressive risk reduction and meds as listed.  DYSLIPIDEMIA:  He is on Crestor which has been reported to cause gynecomastia as it interferes with testosterone metabolism. I will send a note to Dr. Laurance Flatten and asked him to consider this as a possibility as the patient is quite bothered by this.  CAROTID STENOSIS:  This was mild and will be followed in a couple of years with repeat Dopplers.  HTN:  The blood pressure is at target. No change in medications is indicated. We will continue with therapeutic lifestyle changes (TLC).

## 2013-08-12 ENCOUNTER — Ambulatory Visit (INDEPENDENT_AMBULATORY_CARE_PROVIDER_SITE_OTHER): Payer: MEDICARE | Admitting: *Deleted

## 2013-08-12 DIAGNOSIS — E538 Deficiency of other specified B group vitamins: Secondary | ICD-10-CM

## 2013-08-12 NOTE — Progress Notes (Signed)
Vitamin b12 given and tolerated well. 

## 2013-09-03 ENCOUNTER — Ambulatory Visit (INDEPENDENT_AMBULATORY_CARE_PROVIDER_SITE_OTHER): Payer: MEDICARE | Admitting: Family Medicine

## 2013-09-03 ENCOUNTER — Encounter: Payer: Self-pay | Admitting: Family Medicine

## 2013-09-03 VITALS — BP 172/66 | HR 61 | Temp 98.0°F | Ht 72.0 in | Wt 180.0 lb

## 2013-09-03 DIAGNOSIS — D492 Neoplasm of unspecified behavior of bone, soft tissue, and skin: Secondary | ICD-10-CM

## 2013-09-03 DIAGNOSIS — H6120 Impacted cerumen, unspecified ear: Secondary | ICD-10-CM

## 2013-09-03 DIAGNOSIS — Z8546 Personal history of malignant neoplasm of prostate: Secondary | ICD-10-CM

## 2013-09-03 DIAGNOSIS — H6123 Impacted cerumen, bilateral: Secondary | ICD-10-CM

## 2013-09-03 DIAGNOSIS — I10 Essential (primary) hypertension: Secondary | ICD-10-CM

## 2013-09-03 DIAGNOSIS — Z800 Family history of malignant neoplasm of digestive organs: Secondary | ICD-10-CM

## 2013-09-03 DIAGNOSIS — R5381 Other malaise: Secondary | ICD-10-CM

## 2013-09-03 DIAGNOSIS — D469 Myelodysplastic syndrome, unspecified: Secondary | ICD-10-CM

## 2013-09-03 DIAGNOSIS — R5383 Other fatigue: Secondary | ICD-10-CM

## 2013-09-03 DIAGNOSIS — E559 Vitamin D deficiency, unspecified: Secondary | ICD-10-CM | POA: Diagnosis not present

## 2013-09-03 DIAGNOSIS — R531 Weakness: Secondary | ICD-10-CM

## 2013-09-03 DIAGNOSIS — E785 Hyperlipidemia, unspecified: Secondary | ICD-10-CM

## 2013-09-03 DIAGNOSIS — I2510 Atherosclerotic heart disease of native coronary artery without angina pectoris: Secondary | ICD-10-CM | POA: Diagnosis not present

## 2013-09-03 DIAGNOSIS — Z860100 Personal history of colon polyps, unspecified: Secondary | ICD-10-CM

## 2013-09-03 LAB — POCT CBC
Granulocyte percent: 55.5 %G (ref 37–80)
HEMATOCRIT: 34.7 % — AB (ref 43.5–53.7)
Hemoglobin: 11 g/dL — AB (ref 14.1–18.1)
LYMPH, POC: 2.2 (ref 0.6–3.4)
MCH: 28.8 pg (ref 27–31.2)
MCHC: 31.6 g/dL — AB (ref 31.8–35.4)
MCV: 91.3 fL (ref 80–97)
MPV: 7.3 fL (ref 0–99.8)
PLATELET COUNT, POC: 251 10*3/uL (ref 142–424)
POC Granulocyte: 3.3 (ref 2–6.9)
POC LYMPH PERCENT: 36.8 %L (ref 10–50)
RBC: 3.8 M/uL — AB (ref 4.69–6.13)
RDW, POC: 17.5 %
WBC: 5.9 10*3/uL (ref 4.6–10.2)

## 2013-09-03 LAB — POCT GLYCOSYLATED HEMOGLOBIN (HGB A1C): HEMOGLOBIN A1C: 5.9

## 2013-09-03 NOTE — Addendum Note (Signed)
Addended by: Zannie Cove on: 09/03/2013 10:14 AM   Modules accepted: Orders

## 2013-09-03 NOTE — Patient Instructions (Addendum)
Medicare Annual Wellness Visit  [facility] and the medical providers at [facility] strive to bring you the best medical care.  In doing so we not only want to address your current medical conditions and concerns but also to detect new conditions early and prevent illness, disease and health-related problems.    Medicare offers a yearly Wellness Visit which allows our clinical staff to assess your need for preventative services including immunizations, lifestyle education, counseling to decrease risk of preventable diseases and screening for fall risk and other medical concerns.    This visit is provided free of charge (no copay) for all Medicare recipients. The clinical pharmacists at [facility] have begun to conduct these Wellness Visits which will also include a thorough review of all your medications.    As you primary medical provider recommend that you make an appointment for your Annual Wellness Visit if you have not done so already this year.  You may set up this appointment before you leave today or you may call back ([phone]) and schedule an appointment.  Please make sure when you call that you mention that you are scheduling your Annual Wellness Visit with the clinical pharmacist so that the appointment may be made for the proper length of time.      Continue current medications. Continue good therapeutic lifestyle changes which include good diet and exercise. Fall precautions discussed with patient. If an FOBT was given today- please return it to our front desk. If you are over 50 years old - you may need Prevnar 41 or the adult Pneumonia vaccine.  Bring your blood pressure monitor by and have the nurse check your blood pressure with our monitor and check yours with your monitor We will apply a 24-hour blood pressure monitor and  we may end up doing a king of hearts monitor to further check out these spells that you  are currently having. We will address the cholesterol issue once the lab work is returned. Referral to dermatologist

## 2013-09-03 NOTE — Addendum Note (Signed)
Addended by: Selmer Dominion on: 09/03/2013 10:19 AM   Modules accepted: Orders

## 2013-09-03 NOTE — Addendum Note (Signed)
Addended by: Chipper Herb on: 09/03/2013 10:04 AM   Modules accepted: Level of Service

## 2013-09-03 NOTE — Progress Notes (Addendum)
 Subjective:    Patient ID: Jon Reid, male    DOB: 11/10/1928, 80 y.o.   MRN: [id number]  HPI Pt here for follow up and management of chronic medical problems. The patient has been having "weak spells" and these are relieved by drinking orange juice or eating or some kind of food. He denies chest pain or shortness of breath. This can happen in the morning and has happened in the evening with rafting and on and last for short period time. This has been going on for 3-4 weeks. He indicates he's been drinking plenty of fluids. He checks his blood pressure regularly at home and said it says that it runs in the 110/50 range.   Patient is having some near syncopal episodes and weakness.       Patient Active Problem List   Diagnosis Date Noted  . Gynecomastia 04/09/2013  . Gynecomastia, male 04/09/2013  . Coronary artery disease 02/02/2013  . 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 Myelodysplastic syndrome 11/06/2012  . Accelerating angina 11/06/2012  . Vitamin B 12 deficiency 07/07/2012  . Hyperlipemia 07/07/2012  . Macrocytosis without anemia 01/08/2012  . PVD 04/14/2009  . CARCINOMA, PROSTATE 04/13/2009  . VASCULAR PURPURA 04/13/2009  . Hypertension 04/13/2009  . RENAL INSUFFICIENCY 04/13/2009  . SLEEP APNEA 04/13/2009  . ELECTROCARDIOGRAM, ABNORMAL 04/13/2009   Outpatient Encounter Prescriptions as of 09/03/2013  Medication Sig  . aspirin EC 325 MG EC tablet Take 1 tablet (325 mg total) by mouth daily.  . cholecalciferol (VITAMIN D) 1000 UNITS tablet Take 2,000 Units by mouth daily.  . colchicine (COLCRYS) 0.6 MG tablet Take 0.6 mg by mouth as needed.  . furosemide (LASIX) 40 MG tablet Take 1 tablet (40 mg total) by mouth daily. Take one three times a week  . metoprolol succinate (TOPROL-XL) 25 MG 24 hr tablet Take 25 mg by mouth daily.  . nitroGLYCERIN (NITROSTAT) 0.4 MG SL tablet Place 0.4 mg under the tongue every 5 (five) minutes as needed for chest pain.  . triamcinolone cream  (KENALOG) 0.1 % Apply 1 application topically 2 (two) times daily.  . [DISCONTINUED] allopurinol (ZYLOPRIM) 100 MG tablet Take 100 mg by mouth daily.  . [DISCONTINUED] rosuvastatin (CRESTOR) 5 MG tablet Take 1 tablet (5 mg total) by mouth daily.  . [DISCONTINUED] traMADol (ULTRAM) 50 MG tablet 1 twice daily if needed for severe pain    Review of Systems  HENT: Negative.   Eyes: Negative.   Respiratory: Negative.   Cardiovascular: Negative.   Gastrointestinal: Negative.   Endocrine: Negative.   Genitourinary: Negative.   Musculoskeletal: Negative.   Skin: Negative.   Allergic/Immunologic: Negative.   Neurological: Positive for weakness ("weak spells"- they dont last long ).  Hematological: Negative.   Psychiatric/Behavioral: Negative.        Objective:   Physical Exam  Nursing note and vitals reviewed. Constitutional: He is oriented to person, place, and time. He appears well-developed and well-nourished. No distress.  HENT:  Head: Normocephalic and atraumatic.  Right Ear: External ear normal.  Nose: Nose normal.  Mouth/Throat: Oropharynx is clear and moist. No oropharyngeal exudate.  Ears cerumen  Eyes: Conjunctivae and EOM are normal. Pupils are equal, round, and reactive to light. Right eye exhibits no discharge. Left eye exhibits no discharge. No scleral icterus.  Neck: Normal range of motion. Neck supple. No thyromegaly present.  Cardiovascular: Normal rate, regular rhythm and intact distal pulses.  Exam reveals no gallop and no friction rub.   Murmur   heard. A 2/6 systolic ejection murmur at 72 per minute  Pulmonary/Chest: Effort normal and breath sounds normal. No respiratory distress. He has no wheezes. He has no rales. He exhibits no tenderness.  No axillary adenopathy  Abdominal: Soft. Bowel sounds are normal. He exhibits no mass. There is no tenderness. There is no rebound and no guarding.  No inguinal adenopathy  Musculoskeletal: Normal range of motion. He exhibits  no edema and no tenderness.  Lymphadenopathy:    He has no cervical adenopathy.  Neurological: He is alert and oriented to person, place, and time. He has normal reflexes. No cranial nerve deficit.  Skin: Skin is warm and dry. No rash noted. No erythema. No pallor.  Atypical skin lesion right neck  Psychiatric: He has a normal mood and affect. His behavior is normal. Judgment and thought content normal.   BP 164/66  Pulse 64  Temp(Src) 98 F (36.7 C) (Oral)  Ht 6' (1.829 m)  Wt 180 lb (81.647 kg)  BMI 24.41 kg/m2        Assessment & Plan:  1. Coronary artery disease - POCT CBC  2. Hyperlipemia - POCT CBC - NMR, lipoprofile  3. Hypertension - POCT CBC - BMP8+EGFR - Hepatic function panel  4. Vitamin D deficiency - Vit D  25 hydroxy (rtn osteoporosis monitoring)  5. Weakness  6. History of prostate cancer  7. Personal history of colonic polyps  8. Family history of colon cancer  9. Impacted cerumen of both ears  10. Abnormal skin growth  11. Myelodysplastic syndrome, unspecified Patient Instructions                       Medicare Annual Wellness Visit  [facility] and the medical providers at [facility] strive to bring you the best medical care.  In doing so we not only want to address your current medical conditions and concerns but also to detect new conditions early and prevent illness, disease and health-related problems.    Medicare offers a yearly Wellness Visit which allows our clinical staff to assess your need for preventative services including immunizations, lifestyle education, counseling to decrease risk of preventable diseases and screening for fall risk and other medical concerns.    This visit is provided free of charge (no copay) for all Medicare recipients. The clinical pharmacists at [facility] have begun to conduct these Wellness Visits which will also include a thorough review of all your  medications.    As you primary medical provider recommend that you make an appointment for your Annual Wellness Visit if you have not done so already this year.  You may set up this appointment before you leave today or you may call back ([phone]) and schedule an appointment.  Please make sure when you call that you mention that you are scheduling your Annual Wellness Visit with the clinical pharmacist so that the appointment may be made for the proper length of time.      Continue current medications. Continue good therapeutic lifestyle changes which include good diet and exercise. Fall precautions discussed with patient. If an FOBT was given today- please return it to our front desk. If you are over 50 years old - you may need Prevnar 13 or the adult Pneumonia vaccine.  Bring your blood pressure monitor by and have the nurse check your blood pressure with our monitor and check yours with your monitor We will apply a 24-hour blood pressure monitor   and  we may end up doing a king of hearts monitor to further check out these spells that you are currently having. We will address the cholesterol issue once the lab work is returned. Referral to dermatologist   Don W. Moore MD   

## 2013-09-04 LAB — BMP8+EGFR
BUN / CREAT RATIO: 19 (ref 10–22)
BUN: 29 mg/dL — ABNORMAL HIGH (ref 8–27)
CO2: 22 mmol/L (ref 18–29)
Calcium: 9.3 mg/dL (ref 8.6–10.2)
Chloride: 103 mmol/L (ref 97–108)
Creatinine, Ser: 1.54 mg/dL — ABNORMAL HIGH (ref 0.76–1.27)
GFR calc non Af Amer: 41 mL/min/{1.73_m2} — ABNORMAL LOW (ref >59)
GFR, EST AFRICAN AMERICAN: 48 mL/min/{1.73_m2} — AB (ref >59)
Glucose: 96 mg/dL (ref 65–99)
Potassium: 4.6 mmol/L (ref 3.5–5.2)
SODIUM: 141 mmol/L (ref 134–144)

## 2013-09-04 LAB — HEPATIC FUNCTION PANEL
ALT: 11 IU/L (ref 0–44)
AST: 20 IU/L (ref 0–40)
Albumin: 4.5 g/dL (ref 3.5–4.7)
Alkaline Phosphatase: 69 IU/L (ref 39–117)
Bilirubin, Direct: 0.18 mg/dL (ref 0.00–0.40)
Total Bilirubin: 0.7 mg/dL (ref 0.0–1.2)
Total Protein: 6.6 g/dL (ref 6.0–8.5)

## 2013-09-04 LAB — NMR, LIPOPROFILE
Cholesterol: 165 mg/dL (ref 100–199)
HDL Cholesterol by NMR: 60 mg/dL (ref >39)
HDL Particle Number: 31.5 umol/L (ref >=30.5)
LDL Particle Number: 903 nmol/L (ref <1000)
LDL Size: 21.1 nm (ref >20.5)
LDLC SERPL CALC-MCNC: 92 mg/dL (ref 0–99)
LP-IR Score: <25 (ref <=45)
Small LDL Particle Number: 102 nmol/L (ref <=527)
Triglycerides by NMR: 63 mg/dL (ref 0–149)

## 2013-09-04 LAB — VITAMIN D 25 HYDROXY (VIT D DEFICIENCY, FRACTURES): VIT D 25 HYDROXY: 43.8 ng/mL (ref 30.0–100.0)

## 2013-09-14 ENCOUNTER — Ambulatory Visit (INDEPENDENT_AMBULATORY_CARE_PROVIDER_SITE_OTHER): Payer: MEDICARE | Admitting: *Deleted

## 2013-09-14 ENCOUNTER — Other Ambulatory Visit: Payer: Self-pay | Admitting: *Deleted

## 2013-09-14 DIAGNOSIS — E538 Deficiency of other specified B group vitamins: Secondary | ICD-10-CM | POA: Diagnosis not present

## 2013-09-14 DIAGNOSIS — R55 Syncope and collapse: Secondary | ICD-10-CM | POA: Diagnosis not present

## 2013-09-14 DIAGNOSIS — R42 Dizziness and giddiness: Secondary | ICD-10-CM

## 2013-09-14 DIAGNOSIS — R5383 Other fatigue: Secondary | ICD-10-CM | POA: Diagnosis not present

## 2013-09-14 DIAGNOSIS — R531 Weakness: Secondary | ICD-10-CM

## 2013-09-14 DIAGNOSIS — R5381 Other malaise: Secondary | ICD-10-CM | POA: Diagnosis not present

## 2013-09-14 NOTE — Progress Notes (Signed)
Vitamin b12 injection given and tolerated well.  

## 2013-09-14 NOTE — Progress Notes (Signed)
King of hearts placed and patient given instruction on how to record and call in readings. Patient verbalize understanding.

## 2013-09-14 NOTE — Patient Instructions (Signed)
Vitamin B12 Injections Every person needs vitamin B12. A deficiency develops when the body does not get enough of it. One way to overcome this is by getting B12 shots (injections). A B12 shot puts the vitamin directly into muscle tissue. This avoids any problems your body might have in absorbing it from food or a pill. In some people, the body has trouble using the vitamin correctly. This can cause a B12 deficiency. Not consuming enough of the vitamin can also cause a deficiency. Getting enough vitamin B12 can be hard for elderly people. Sometimes, they do not eat a well-balanced diet. The elderly are also more likely than younger people to have medical conditions or take medications that can lead to a deficiency. WHAT DOES VITAMIN B12 DO? Vitamin B12 does many things to help the body work right:  It helps the body make healthy red blood cells.  It helps maintain nerve cells.  It is involved in the body's process of converting food into energy (metabolism).  It is needed to make the genetic material in all cells (DNA). VITAMIN B12 FOOD SOURCES Most people get plenty of vitamin B12 through the foods they eat. It is present in:  Meat, fish, poultry, and eggs.  Milk and milk products.  It also is added when certain foods are made, including some breads, cereals and yogurts. The food is then called "fortified". CAUSES The most common causes of vitamin B12 deficiency are:  Pernicious anemia. The condition develops when the body cannot make enough healthy red blood cells. This stems from a lack of a protein made in the stomach (intrinsic factor). People without this protein cannot absorb enough vitamin B12 from food.  Malabsorption. This is when the body cannot absorb the vitamin. It can be caused by:  Pernicious anemia.  Surgery to remove part or all of the stomach can lead to malabsorption. Removal of part or all of the small intestine can also cause malabsorption.  Vegetarian diet.  People who are strict about not eating foods from animals could have trouble taking in enough vitamin B12 from diet alone.  Medications. Some medicines have been linked to B12 deficiency, such as Metformin (a drug prescribed for type 2 diabetes). Long-term use of stomach acid suppressants also can keep the vitamin from being absorbed.  Intestinal problems such as inflammatory bowel disease. If there are problems in the digestive tract, vitamin B12 may not be absorbed in good enough amounts. SYMPTOMS People who do not get enough B12 can develop problems. These can include:  Anemia. This is when the body has too few red blood cells. Red blood cells carry oxygen to the rest of the body. Without a healthy supply of red blood cells, people can feel:  Tired (fatigued).  Weak.  Severe anemia can cause:  Shortness of breath.  Dizziness.  Rapid heart rate.  Paleness.  Other Vitamin B12 deficiency symptoms include:  Diarrhea.  Numbness or tingling in the hands or feet.  Loss of appetite.  Confusion.  Sores on the tongue or in the mouth. LET YOUR CAREGIVER KNOW ABOUT:  Any allergies. It is very important to know if you are allergic or sensitive to cobalt. Vitamin B12 contains cobalt.  Any history of kidney disease.  All medications you are taking. Include prescription and over-the-counter medicines, herbs and creams.  Whether you are pregnant or breast-feeding.  If you have Leber's disease, a hereditary eye condition, vitamin B12 could make it worse. RISKS AND COMPLICATIONS Reactions to an injection are   usually temporary. They might include:  Pain at the injection site.  Redness, swelling or tenderness at the site.  Headache, dizziness or weakness.  Nausea, upset stomach or diarrhea.  Numbness or tingling.  Fever.  Joint pain.  Itching or rash. If a reaction does not go away in a short while, talk with your healthcare provider. A change in the way the shots are  given, or where they are given, might need to be made. BEFORE AN INJECTION To decide whether B12 injections are right for you, your healthcare provider will probably:  Ask about your medical history.  Ask questions about your diet.  Ask about symptoms such as:  Have you felt weak?  Do you feel unusually tired?  Do you get dizzy?  Order blood tests. These may include a test to:  Check the level of red cells in your blood.  Measure B12 levels.  Check for the presence of intrinsic factor. VITAMIN B12 INJECTIONS How often you will need a vitamin B12 injection will depend on how severe your deficiency is. This also will affect how long you will need to get them. People with pernicious anemia usually get injections for their entire life. Others might get them for a shorter period. For many people, injections are given daily or weekly for several weeks. Then, once B12 levels are normal, injections are given just once a month. If the cause of the deficiency can be fixed, the injections can be stopped. Talk with your healthcare provider about what you should expect. For an injection:  The injection site will be cleaned with an alcohol swab.  Your healthcare provider will insert a needle directly into a muscle. Most any muscle can be used. Most often, an arm muscle is used. A buttocks muscle can also be used. Many people say shots in that area are less painful.  A small adhesive bandage may be put over the injection site. It usually can be taken off in an hour or less. Injections can be given by your healthcare provider. In some cases, family members give them. Sometimes, people give them to themselves. Talk with your healthcare provider about what would be best for you. If someone other than your healthcare provider will be giving the shots, the person will need to be trained to give them correctly. HOME CARE INSTRUCTIONS   You can remove the adhesive bandage within an hour of getting a  shot.  You should be able to go about your normal activities right away.  Avoid drinking large amounts of alcohol while taking vitamin B12 shots. Alcohol can interfere with the body's use of the vitamin. SEEK MEDICAL CARE IF:   Pain, redness, swelling or tenderness at the injection site does not get better or gets worse.  Headache, dizziness or weakness does not go away.  You develop a fever of more than 100.5 F (38.1 C). SEEK IMMEDIATE MEDICAL CARE IF:   You have chest pain.  You develop shortness of breath.  You have muscle weakness that gets worse.  You develop numbness, weakness or tingling on one side or one area of the body.  You have symptoms of an allergic reaction, such as:  Hives.  Difficulty breathing.  Swelling of the lips, face, tongue or throat.  You develop a fever of more than 102.0 F (38.9 C). MAKE SURE YOU:   Understand these instructions.  Will watch your condition.  Will get help right away if you are not doing well or get worse. Document   Released: 06/01/2008 Document Revised: 05/28/2011 Document Reviewed: 06/01/2008 Exit[employer] Patient Information 2015 [employer]. This information is not intended to replace advice given to you by your health care provider. Make sure you discuss any questions you have with your health care provider.  

## 2013-09-14 NOTE — Addendum Note (Signed)
Addended by: Thana Ates on: 09/14/2013 03:28 PM   Modules accepted: Orders

## 2013-09-15 ENCOUNTER — Telehealth: Payer: Self-pay | Admitting: *Deleted

## 2013-09-15 NOTE — Telephone Encounter (Signed)
Baseline for heart - monitor was normal

## 2013-09-17 DIAGNOSIS — Q100 Congenital ptosis: Secondary | ICD-10-CM | POA: Diagnosis not present

## 2013-09-17 DIAGNOSIS — Z961 Presence of intraocular lens: Secondary | ICD-10-CM | POA: Diagnosis not present

## 2013-09-17 DIAGNOSIS — H04129 Dry eye syndrome of unspecified lacrimal gland: Secondary | ICD-10-CM | POA: Diagnosis not present

## 2013-09-17 DIAGNOSIS — H43819 Vitreous degeneration, unspecified eye: Secondary | ICD-10-CM | POA: Diagnosis not present

## 2013-09-22 ENCOUNTER — Encounter: Payer: Self-pay | Admitting: Family Medicine

## 2013-10-07 ENCOUNTER — Telehealth: Payer: Self-pay | Admitting: *Deleted

## 2013-10-07 NOTE — Telephone Encounter (Signed)
LM that event monitor readings normal so far and to continue to record events and call them in and we would let him know if any thing changes.

## 2013-10-14 ENCOUNTER — Ambulatory Visit (INDEPENDENT_AMBULATORY_CARE_PROVIDER_SITE_OTHER): Payer: MEDICARE

## 2013-10-14 DIAGNOSIS — E538 Deficiency of other specified B group vitamins: Secondary | ICD-10-CM | POA: Diagnosis not present

## 2013-11-16 ENCOUNTER — Ambulatory Visit (INDEPENDENT_AMBULATORY_CARE_PROVIDER_SITE_OTHER): Payer: MEDICARE | Admitting: *Deleted

## 2013-11-16 DIAGNOSIS — E538 Deficiency of other specified B group vitamins: Secondary | ICD-10-CM | POA: Diagnosis not present

## 2013-11-16 NOTE — Progress Notes (Signed)
Patient tolerated well.

## 2013-11-16 NOTE — Patient Instructions (Signed)
Vitamin B12 Injections Every person needs vitamin B12. A deficiency develops when the body does not get enough of it. One way to overcome this is by getting B12 shots (injections). A B12 shot puts the vitamin directly into muscle tissue. This avoids any problems your body might have in absorbing it from food or a pill. In some people, the body has trouble using the vitamin correctly. This can cause a B12 deficiency. Not consuming enough of the vitamin can also cause a deficiency. Getting enough vitamin B12 can be hard for elderly people. Sometimes, they do not eat a well-balanced diet. The elderly are also more likely than younger people to have medical conditions or take medications that can lead to a deficiency. WHAT DOES VITAMIN B12 DO? Vitamin B12 does many things to help the body work right:  It helps the body make healthy red blood cells.  It helps maintain nerve cells.  It is involved in the body's process of converting food into energy (metabolism).  It is needed to make the genetic material in all cells (DNA). VITAMIN B12 FOOD SOURCES Most people get plenty of vitamin B12 through the foods they eat. It is present in:  Meat, fish, poultry, and eggs.  Milk and milk products.  It also is added when certain foods are made, including some breads, cereals and yogurts. The food is then called "fortified". CAUSES The most common causes of vitamin B12 deficiency are:  Pernicious anemia. The condition develops when the body cannot make enough healthy red blood cells. This stems from a lack of a protein made in the stomach (intrinsic factor). People without this protein cannot absorb enough vitamin B12 from food.  Malabsorption. This is when the body cannot absorb the vitamin. It can be caused by:  Pernicious anemia.  Surgery to remove part or all of the stomach can lead to malabsorption. Removal of part or all of the small intestine can also cause malabsorption.  Vegetarian diet.  People who are strict about not eating foods from animals could have trouble taking in enough vitamin B12 from diet alone.  Medications. Some medicines have been linked to B12 deficiency, such as Metformin (a drug prescribed for type 2 diabetes). Long-term use of stomach acid suppressants also can keep the vitamin from being absorbed.  Intestinal problems such as inflammatory bowel disease. If there are problems in the digestive tract, vitamin B12 may not be absorbed in good enough amounts. SYMPTOMS People who do not get enough B12 can develop problems. These can include:  Anemia. This is when the body has too few red blood cells. Red blood cells carry oxygen to the rest of the body. Without a healthy supply of red blood cells, people can feel:  Tired (fatigued).  Weak.  Severe anemia can cause:  Shortness of breath.  Dizziness.  Rapid heart rate.  Paleness.  Other Vitamin B12 deficiency symptoms include:  Diarrhea.  Numbness or tingling in the hands or feet.  Loss of appetite.  Confusion.  Sores on the tongue or in the mouth. LET YOUR CAREGIVER KNOW ABOUT:  Any allergies. It is very important to know if you are allergic or sensitive to cobalt. Vitamin B12 contains cobalt.  Any history of kidney disease.  All medications you are taking. Include prescription and over-the-counter medicines, herbs and creams.  Whether you are pregnant or breast-feeding.  If you have Leber's disease, a hereditary eye condition, vitamin B12 could make it worse. RISKS AND COMPLICATIONS Reactions to an injection are   usually temporary. They might include:  Pain at the injection site.  Redness, swelling or tenderness at the site.  Headache, dizziness or weakness.  Nausea, upset stomach or diarrhea.  Numbness or tingling.  Fever.  Joint pain.  Itching or rash. If a reaction does not go away in a short while, talk with your healthcare provider. A change in the way the shots are  given, or where they are given, might need to be made. BEFORE AN INJECTION To decide whether B12 injections are right for you, your healthcare provider will probably:  Ask about your medical history.  Ask questions about your diet.  Ask about symptoms such as:  Have you felt weak?  Do you feel unusually tired?  Do you get dizzy?  Order blood tests. These may include a test to:  Check the level of red cells in your blood.  Measure B12 levels.  Check for the presence of intrinsic factor. VITAMIN B12 INJECTIONS How often you will need a vitamin B12 injection will depend on how severe your deficiency is. This also will affect how long you will need to get them. People with pernicious anemia usually get injections for their entire life. Others might get them for a shorter period. For many people, injections are given daily or weekly for several weeks. Then, once B12 levels are normal, injections are given just once a month. If the cause of the deficiency can be fixed, the injections can be stopped. Talk with your healthcare provider about what you should expect. For an injection:  The injection site will be cleaned with an alcohol swab.  Your healthcare provider will insert a needle directly into a muscle. Most any muscle can be used. Most often, an arm muscle is used. A buttocks muscle can also be used. Many people say shots in that area are less painful.  A small adhesive bandage may be put over the injection site. It usually can be taken off in an hour or less. Injections can be given by your healthcare provider. In some cases, family members give them. Sometimes, people give them to themselves. Talk with your healthcare provider about what would be best for you. If someone other than your healthcare provider will be giving the shots, the person will need to be trained to give them correctly. HOME CARE INSTRUCTIONS   You can remove the adhesive bandage within an hour of getting a  shot.  You should be able to go about your normal activities right away.  Avoid drinking large amounts of alcohol while taking vitamin B12 shots. Alcohol can interfere with the body's use of the vitamin. SEEK MEDICAL CARE IF:   Pain, redness, swelling or tenderness at the injection site does not get better or gets worse.  Headache, dizziness or weakness does not go away.  You develop a fever of more than 100.5 F (38.1 C). SEEK IMMEDIATE MEDICAL CARE IF:   You have chest pain.  You develop shortness of breath.  You have muscle weakness that gets worse.  You develop numbness, weakness or tingling on one side or one area of the body.  You have symptoms of an allergic reaction, such as:  Hives.  Difficulty breathing.  Swelling of the lips, face, tongue or throat.  You develop a fever of more than 102.0 F (38.9 C). MAKE SURE YOU:   Understand these instructions.  Will watch your condition.  Will get help right away if you are not doing well or get worse. Document   Released: 06/01/2008 Document Revised: 05/28/2011 Document Reviewed: 06/01/2008 Exit[employer] Patient Information 2015 [employer]. This information is not intended to replace advice given to you by your health care provider. Make sure you discuss any questions you have with your health care provider.  

## 2013-12-18 ENCOUNTER — Ambulatory Visit (INDEPENDENT_AMBULATORY_CARE_PROVIDER_SITE_OTHER): Payer: MEDICARE | Admitting: *Deleted

## 2013-12-18 DIAGNOSIS — E538 Deficiency of other specified B group vitamins: Secondary | ICD-10-CM

## 2013-12-18 NOTE — Progress Notes (Signed)
Patient ID: Jon Reid, male   DOB: 07-10-1928, 80 y.o.   MRN: [id number] Vitamin b12 injection given and tolerated well

## 2013-12-18 NOTE — Patient Instructions (Signed)
Vitamin B12 Injections Every person needs vitamin B12. A deficiency develops when the body does not get enough of it. One way to overcome this is by getting B12 shots (injections). A B12 shot puts the vitamin directly into muscle tissue. This avoids any problems your body might have in absorbing it from food or a pill. In some people, the body has trouble using the vitamin correctly. This can cause a B12 deficiency. Not consuming enough of the vitamin can also cause a deficiency. Getting enough vitamin B12 can be hard for elderly people. Sometimes, they do not eat a well-balanced diet. The elderly are also more likely than younger people to have medical conditions or take medications that can lead to a deficiency. WHAT DOES VITAMIN B12 DO? Vitamin B12 does many things to help the body work right:  It helps the body make healthy red blood cells.  It helps maintain nerve cells.  It is involved in the body's process of converting food into energy (metabolism).  It is needed to make the genetic material in all cells (DNA). VITAMIN B12 FOOD SOURCES Most people get plenty of vitamin B12 through the foods they eat. It is present in:  Meat, fish, poultry, and eggs.  Milk and milk products.  It also is added when certain foods are made, including some breads, cereals and yogurts. The food is then called "fortified". CAUSES The most common causes of vitamin B12 deficiency are:  Pernicious anemia. The condition develops when the body cannot make enough healthy red blood cells. This stems from a lack of a protein made in the stomach (intrinsic factor). People without this protein cannot absorb enough vitamin B12 from food.  Malabsorption. This is when the body cannot absorb the vitamin. It can be caused by:  Pernicious anemia.  Surgery to remove part or all of the stomach can lead to malabsorption. Removal of part or all of the small intestine can also cause malabsorption.  Vegetarian diet.  People who are strict about not eating foods from animals could have trouble taking in enough vitamin B12 from diet alone.  Medications. Some medicines have been linked to B12 deficiency, such as Metformin (a drug prescribed for type 2 diabetes). Long-term use of stomach acid suppressants also can keep the vitamin from being absorbed.  Intestinal problems such as inflammatory bowel disease. If there are problems in the digestive tract, vitamin B12 may not be absorbed in good enough amounts. SYMPTOMS People who do not get enough B12 can develop problems. These can include:  Anemia. This is when the body has too few red blood cells. Red blood cells carry oxygen to the rest of the body. Without a healthy supply of red blood cells, people can feel:  Tired (fatigued).  Weak.  Severe anemia can cause:  Shortness of breath.  Dizziness.  Rapid heart rate.  Paleness.  Other Vitamin B12 deficiency symptoms include:  Diarrhea.  Numbness or tingling in the hands or feet.  Loss of appetite.  Confusion.  Sores on the tongue or in the mouth. LET YOUR CAREGIVER KNOW ABOUT:  Any allergies. It is very important to know if you are allergic or sensitive to cobalt. Vitamin B12 contains cobalt.  Any history of kidney disease.  All medications you are taking. Include prescription and over-the-counter medicines, herbs and creams.  Whether you are pregnant or breast-feeding.  If you have Leber's disease, a hereditary eye condition, vitamin B12 could make it worse. RISKS AND COMPLICATIONS Reactions to an injection are   usually temporary. They might include:  Pain at the injection site.  Redness, swelling or tenderness at the site.  Headache, dizziness or weakness.  Nausea, upset stomach or diarrhea.  Numbness or tingling.  Fever.  Joint pain.  Itching or rash. If a reaction does not go away in a short while, talk with your healthcare provider. A change in the way the shots are  given, or where they are given, might need to be made. BEFORE AN INJECTION To decide whether B12 injections are right for you, your healthcare provider will probably:  Ask about your medical history.  Ask questions about your diet.  Ask about symptoms such as:  Have you felt weak?  Do you feel unusually tired?  Do you get dizzy?  Order blood tests. These may include a test to:  Check the level of red cells in your blood.  Measure B12 levels.  Check for the presence of intrinsic factor. VITAMIN B12 INJECTIONS How often you will need a vitamin B12 injection will depend on how severe your deficiency is. This also will affect how long you will need to get them. People with pernicious anemia usually get injections for their entire life. Others might get them for a shorter period. For many people, injections are given daily or weekly for several weeks. Then, once B12 levels are normal, injections are given just once a month. If the cause of the deficiency can be fixed, the injections can be stopped. Talk with your healthcare provider about what you should expect. For an injection:  The injection site will be cleaned with an alcohol swab.  Your healthcare provider will insert a needle directly into a muscle. Most any muscle can be used. Most often, an arm muscle is used. A buttocks muscle can also be used. Many people say shots in that area are less painful.  A small adhesive bandage may be put over the injection site. It usually can be taken off in an hour or less. Injections can be given by your healthcare provider. In some cases, family members give them. Sometimes, people give them to themselves. Talk with your healthcare provider about what would be best for you. If someone other than your healthcare provider will be giving the shots, the person will need to be trained to give them correctly. HOME CARE INSTRUCTIONS   You can remove the adhesive bandage within an hour of getting a  shot.  You should be able to go about your normal activities right away.  Avoid drinking large amounts of alcohol while taking vitamin B12 shots. Alcohol can interfere with the body's use of the vitamin. SEEK MEDICAL CARE IF:   Pain, redness, swelling or tenderness at the injection site does not get better or gets worse.  Headache, dizziness or weakness does not go away.  You develop a fever of more than 100.5 F (38.1 C). SEEK IMMEDIATE MEDICAL CARE IF:   You have chest pain.  You develop shortness of breath.  You have muscle weakness that gets worse.  You develop numbness, weakness or tingling on one side or one area of the body.  You have symptoms of an allergic reaction, such as:  Hives.  Difficulty breathing.  Swelling of the lips, face, tongue or throat.  You develop a fever of more than 102.0 F (38.9 C). MAKE SURE YOU:   Understand these instructions.  Will watch your condition.  Will get help right away if you are not doing well or get worse. Document   Released: 06/01/2008 Document Revised: 05/28/2011 Document Reviewed: 06/01/2008 Exit[employer] Patient Information 2015 [employer]. This information is not intended to replace advice given to you by your health care provider. Make sure you discuss any questions you have with your health care provider.  

## 2013-12-29 ENCOUNTER — Other Ambulatory Visit: Payer: Self-pay | Admitting: Physician Assistant

## 2014-01-12 ENCOUNTER — Encounter: Payer: Self-pay | Admitting: Family Medicine

## 2014-01-12 ENCOUNTER — Ambulatory Visit (INDEPENDENT_AMBULATORY_CARE_PROVIDER_SITE_OTHER): Payer: MEDICARE | Admitting: Family Medicine

## 2014-01-12 VITALS — BP 158/71 | HR 60 | Temp 97.0°F | Ht 72.0 in | Wt 184.0 lb

## 2014-01-12 DIAGNOSIS — Z23 Encounter for immunization: Secondary | ICD-10-CM | POA: Diagnosis not present

## 2014-01-12 DIAGNOSIS — E538 Deficiency of other specified B group vitamins: Secondary | ICD-10-CM | POA: Diagnosis not present

## 2014-01-12 DIAGNOSIS — N62 Hypertrophy of breast: Secondary | ICD-10-CM

## 2014-01-12 DIAGNOSIS — R531 Weakness: Secondary | ICD-10-CM | POA: Diagnosis not present

## 2014-01-12 DIAGNOSIS — I2510 Atherosclerotic heart disease of native coronary artery without angina pectoris: Secondary | ICD-10-CM

## 2014-01-12 DIAGNOSIS — E559 Vitamin D deficiency, unspecified: Secondary | ICD-10-CM

## 2014-01-12 DIAGNOSIS — R5383 Other fatigue: Secondary | ICD-10-CM | POA: Diagnosis not present

## 2014-01-12 DIAGNOSIS — I10 Essential (primary) hypertension: Secondary | ICD-10-CM | POA: Diagnosis not present

## 2014-01-12 DIAGNOSIS — E785 Hyperlipidemia, unspecified: Secondary | ICD-10-CM | POA: Diagnosis not present

## 2014-01-12 DIAGNOSIS — I25810 Atherosclerosis of coronary artery bypass graft(s) without angina pectoris: Secondary | ICD-10-CM

## 2014-01-12 MED ORDER — FUROSEMIDE 40 MG PO TABS: 40.0000 mg | ORAL_TABLET | Freq: Every day | ORAL | Status: DC

## 2014-01-12 NOTE — Addendum Note (Signed)
Addended by: Pollyann Kennedy F on: 01/12/2014 11:15 AM   Modules accepted: Orders

## 2014-01-12 NOTE — Progress Notes (Signed)
Subjective:    Patient ID: Jon Reid, male    DOB: November 24, 1928, 80 y.o.   MRN: [id number]  HPI Pt here for follow up and management of chronic medical problems. The patient's main complaint today is sudden weakness at times. This lasts a few minutes and gets better. He is due to get lab work today. He sees the urologist yearly for his rectal exam. He is up-to-date on his flu shot and Prevnar vaccine. He is requesting a refill on his Lasix. He has not restarted his cholesterol medicine due to its presumed effect on causing his gynecomastia. The patient continues to have weak spells. The frequency of of these spells may be 2-3 times a day or as little as every 4 days. They may last from a few minutes to as long as 30 minutes. He describes them as feeling weak all over and they are not asymmetric. He does indicate that eating more frequently may help these spells.        Patient Active Problem List   Diagnosis Date Noted  . Gynecomastia 04/09/2013  . Gynecomastia, male 04/09/2013  . Coronary artery disease 02/02/2013  . 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 Myelodysplastic syndrome 11/06/2012  . Accelerating angina 11/06/2012  . Vitamin B 12 deficiency 07/07/2012  . Hyperlipemia 07/07/2012  . Macrocytosis without anemia 01/08/2012  . PVD 04/14/2009  . CARCINOMA, PROSTATE 04/13/2009  . VASCULAR PURPURA 04/13/2009  . Benign essential HTN 04/13/2009  . RENAL INSUFFICIENCY 04/13/2009  . SLEEP APNEA 04/13/2009  . ELECTROCARDIOGRAM, ABNORMAL 04/13/2009   Outpatient Encounter Prescriptions as of 01/12/2014  Medication Sig  . aspirin EC 325 MG EC tablet Take 1 tablet (325 mg total) by mouth daily.  . cholecalciferol (VITAMIN D) 1000 UNITS tablet Take 2,000 Units by mouth daily.  . colchicine (COLCRYS) 0.6 MG tablet Take 0.6 mg by mouth as needed.  . furosemide (LASIX) 40 MG tablet Take 1 tablet (40 mg total) by mouth daily. Take one three times a week  . metoprolol succinate (TOPROL-XL) 25 MG  24 hr tablet TAKE 1 TABLET ONCE A DAY  . triamcinolone cream (KENALOG) 0.1 % Apply 1 application topically 2 (two) times daily.  . nitroGLYCERIN (NITROSTAT) 0.4 MG SL tablet Place 0.4 mg under the tongue every 5 (five) minutes as needed for chest pain.  . [DISCONTINUED] metoprolol succinate (TOPROL-XL) 25 MG 24 hr tablet Take 25 mg by mouth daily.    Review of Systems  HENT: Negative.   Eyes: Negative.   Respiratory: Negative.   Cardiovascular: Negative.   Gastrointestinal: Negative.   Endocrine: Negative.   Genitourinary: Negative.   Musculoskeletal: Negative.   Skin: Negative.   Allergic/Immunologic: Negative.   Neurological: Positive for weakness (sudden weakness - at times).  Hematological: Negative.   Psychiatric/Behavioral: Negative.        Objective:   Physical Exam  Nursing note and vitals reviewed. Constitutional: He is oriented to person, place, and time. He appears well-developed and well-nourished. No distress.  The patient is alert and pleasant and somewhat kyphotic in appearance.  HENT:  Head: Normocephalic and atraumatic.  Right Ear: External ear normal.  Left Ear: External ear normal.  Nose: Nose normal.  Mouth/Throat: Oropharynx is clear and moist. No oropharyngeal exudate.  Eyes: Conjunctivae and EOM are normal. Pupils are equal, round, and reactive to light. Right eye exhibits no discharge. Left eye exhibits no discharge. No scleral icterus.  Neck: Normal range of motion. Neck supple. No thyromegaly present.  There  are no carotid bruits or anterior cervical adenopathy  Cardiovascular: Normal rate, regular rhythm, normal heart sounds and intact distal pulses.   No murmur heard. At 72/m with a regular rhythm  Pulmonary/Chest: Effort normal and breath sounds normal. No respiratory distress. He has no wheezes. He has no rales. He exhibits no tenderness.  Abdominal: Soft. Bowel sounds are normal. He exhibits no mass. There is no tenderness. There is no rebound  and no guarding.  Musculoskeletal: Normal range of motion. He exhibits no edema.  Lymphadenopathy:    He has no cervical adenopathy.  Neurological: He is alert and oriented to person, place, and time. He has normal reflexes. No cranial nerve deficit.  Skin: Skin is warm and dry. No rash noted. No erythema. No pallor.  Psychiatric: He has a normal mood and affect. His behavior is normal. Judgment and thought content normal.   BP 158/71  Pulse 60  Temp(Src) 97 F (36.1 C) (Oral)  Ht 6' (1.829 m)  Wt 184 lb (83.462 kg)  BMI 24.95 kg/m2        Assessment & Plan:  1. Hyperlipemia - POCT CBC - NMR, lipoprofile  2. Benign essential HTN - POCT CBC - BMP8+EGFR - Hepatic function panel  3. Vitamin B 12 deficiency - POCT CBC  4. Encounter for immunization  5. Vitamin D deficiency - Vit D  25 hydroxy (rtn osteoporosis monitoring)  6. Gynecomastia, male -Improved off of Crestor  7. Coronary artery disease involving autologous vein coronary bypass graft without angina pectoris -Continue follow-up with cardiology  8. Weak spells -Monitor blood sugars and bring readings back for review  Meds ordered this encounter  Medications  . furosemide (LASIX) 40 MG tablet    Sig: Take 1 tablet (40 mg total) by mouth daily. Take one three times a week    Dispense:  30 tablet    Refill:  5   Patient Instructions                       Medicare Annual Wellness Visit  [facility] and the medical providers at [facility] strive to bring you the best medical care.  In doing so we not only want to address your current medical conditions and concerns but also to detect new conditions early and prevent illness, disease and health-related problems.    Medicare offers a yearly Wellness Visit which allows our clinical staff to assess your need for preventative services including immunizations, lifestyle education, counseling to decrease risk of preventable diseases and  screening for fall risk and other medical concerns.    This visit is provided free of charge (no copay) for all Medicare recipients. The clinical pharmacists at [facility] have begun to conduct these Wellness Visits which will also include a thorough review of all your medications.    As you primary medical provider recommend that you make an appointment for your Annual Wellness Visit if you have not done so already this year.  You may set up this appointment before you leave today or you may call back ([phone]) and schedule an appointment.  Please make sure when you call that you mention that you are scheduling your Annual Wellness Visit with the clinical pharmacist so that the appointment may be made for the proper length of time.      Continue current medications. Continue good therapeutic lifestyle changes which include good diet and exercise. Fall precautions discussed with patient. If an FOBT  was given today- please return it to our front desk. If you are over 54 years old - you may need Prevnar 42 or the adult Pneumonia vaccine.  Flu Shots will be available at our office starting mid- September. Please call and schedule a FLU CLINIC APPOINTMENT.   Monitor blood sugars as directed and bring these readings in in 3-4 weeks for review Continue to watch your sodium intake and monitor blood pressures at home. If you have a severe spell or week episode please return to the office immediately for evaluation Drink plenty of fluids    Arrie Senate MD

## 2014-01-12 NOTE — Patient Instructions (Addendum)
Medicare Annual Wellness Visit  [facility] and the medical providers at [facility] strive to bring you the best medical care.  In doing so we not only want to address your current medical conditions and concerns but also to detect new conditions early and prevent illness, disease and health-related problems.    Medicare offers a yearly Wellness Visit which allows our clinical staff to assess your need for preventative services including immunizations, lifestyle education, counseling to decrease risk of preventable diseases and screening for fall risk and other medical concerns.    This visit is provided free of charge (no copay) for all Medicare recipients. The clinical pharmacists at [facility] have begun to conduct these Wellness Visits which will also include a thorough review of all your medications.    As you primary medical provider recommend that you make an appointment for your Annual Wellness Visit if you have not done so already this year.  You may set up this appointment before you leave today or you may call back [phone]) and schedule an appointment.  Please make sure when you call that you mention that you are scheduling your Annual Wellness Visit with the clinical pharmacist so that the appointment may be made for the proper length of time.      Continue current medications. Continue good therapeutic lifestyle changes which include good diet and exercise. Fall precautions discussed with patient. If an FOBT was given today- please return it to our front desk. If you are over 34 years old - you may need Prevnar 33 or the adult Pneumonia vaccine.  Flu Shots will be available at our office starting mid- September. Please call and schedule a FLU CLINIC APPOINTMENT.   Monitor blood sugars as directed and bring these readings in in 3-4 weeks for review Continue to watch your sodium intake and monitor blood pressures  at home. If you have a severe spell or week episode please return to the office immediately for evaluation Drink plenty of fluids

## 2014-01-12 NOTE — Addendum Note (Signed)
Addended by: Pollyann Kennedy F on: 01/12/2014 11:13 AM   Modules accepted: Orders

## 2014-01-13 LAB — NMR, LIPOPROFILE
CHOLESTEROL: 167 mg/dL (ref 100–199)
HDL CHOLESTEROL BY NMR: 66 mg/dL (ref >39)
HDL Particle Number: 35.6 umol/L (ref >=30.5)
LDL Particle Number: 611 nmol/L (ref <1000)
LDL Size: 20.6 nm (ref >20.5)
LDL-C: 85 mg/dL (ref 0–99)
LP-IR Score: 30 (ref <=45)
SMALL LDL PARTICLE NUMBER: 284 nmol/L (ref <=527)
TRIGLYCERIDES BY NMR: 79 mg/dL (ref 0–149)

## 2014-01-13 LAB — HEPATIC FUNCTION PANEL
ALBUMIN: 4.8 g/dL — AB (ref 3.5–4.7)
ALT: 12 IU/L (ref 0–44)
AST: 22 IU/L (ref 0–40)
Alkaline Phosphatase: 81 IU/L (ref 39–117)
BILIRUBIN DIRECT: 0.15 mg/dL (ref 0.00–0.40)
BILIRUBIN TOTAL: 0.5 mg/dL (ref 0.0–1.2)
TOTAL PROTEIN: 7.2 g/dL (ref 6.0–8.5)

## 2014-01-13 LAB — CBC WITH DIFFERENTIAL
BASOS ABS: 0 10*3/uL (ref 0.0–0.2)
Basos: 0 %
Eos: 1 %
Eosinophils Absolute: 0.1 10*3/uL (ref 0.0–0.4)
HEMATOCRIT: 38 % (ref 37.5–51.0)
Hemoglobin: 12.9 g/dL (ref 12.6–17.7)
IMMATURE GRANS (ABS): 0 10*3/uL (ref 0.0–0.1)
Immature Granulocytes: 0 %
LYMPHS ABS: 1.8 10*3/uL (ref 0.7–3.1)
Lymphs: 32 %
MCH: 32.5 pg (ref 26.6–33.0)
MCHC: 33.9 g/dL (ref 31.5–35.7)
MCV: 96 fL (ref 79–97)
MONOCYTES: 14 %
MONOS ABS: 0.8 10*3/uL (ref 0.1–0.9)
NEUTROS ABS: 3 10*3/uL (ref 1.4–7.0)
Neutrophils Relative %: 53 %
Platelets: 305 10*3/uL (ref 150–379)
RBC: 3.97 x10E6/uL — ABNORMAL LOW (ref 4.14–5.80)
RDW: 15.1 % (ref 12.3–15.4)
WBC: 5.7 10*3/uL (ref 3.4–10.8)

## 2014-01-13 LAB — BMP8+EGFR
BUN/Creatinine Ratio: 12 (ref 10–22)
BUN: 17 mg/dL (ref 8–27)
CALCIUM: 9.5 mg/dL (ref 8.6–10.2)
CO2: 23 mmol/L (ref 18–29)
Chloride: 100 mmol/L (ref 97–108)
Creatinine, Ser: 1.45 mg/dL — ABNORMAL HIGH (ref 0.76–1.27)
GFR calc Af Amer: 51 mL/min/{1.73_m2} — ABNORMAL LOW (ref >59)
GFR, EST NON AFRICAN AMERICAN: 44 mL/min/{1.73_m2} — AB (ref >59)
GLUCOSE: 108 mg/dL — AB (ref 65–99)
Potassium: 4 mmol/L (ref 3.5–5.2)
SODIUM: 141 mmol/L (ref 134–144)

## 2014-01-13 LAB — VITAMIN D 25 HYDROXY (VIT D DEFICIENCY, FRACTURES): Vit D, 25-Hydroxy: 41.5 ng/mL (ref 30.0–100.0)

## 2014-01-20 ENCOUNTER — Ambulatory Visit (INDEPENDENT_AMBULATORY_CARE_PROVIDER_SITE_OTHER): Payer: MEDICARE

## 2014-01-20 DIAGNOSIS — E538 Deficiency of other specified B group vitamins: Secondary | ICD-10-CM | POA: Diagnosis not present

## 2014-02-25 ENCOUNTER — Encounter (HOSPITAL_COMMUNITY): Payer: Self-pay | Admitting: Cardiovascular Disease

## 2014-02-25 ENCOUNTER — Ambulatory Visit (INDEPENDENT_AMBULATORY_CARE_PROVIDER_SITE_OTHER): Payer: MEDICARE | Admitting: *Deleted

## 2014-02-25 DIAGNOSIS — E538 Deficiency of other specified B group vitamins: Secondary | ICD-10-CM | POA: Diagnosis not present

## 2014-02-25 NOTE — Progress Notes (Signed)
Vitamin b12 injection given and tolerated well.  

## 2014-02-25 NOTE — Patient Instructions (Signed)

## 2014-04-02 ENCOUNTER — Ambulatory Visit (INDEPENDENT_AMBULATORY_CARE_PROVIDER_SITE_OTHER): Payer: MEDICARE | Admitting: *Deleted

## 2014-04-02 DIAGNOSIS — E538 Deficiency of other specified B group vitamins: Secondary | ICD-10-CM | POA: Diagnosis not present

## 2014-04-02 NOTE — Progress Notes (Signed)
Vitamin b12 injection given and tolerated well.  

## 2014-04-02 NOTE — Patient Instructions (Signed)

## 2014-04-13 ENCOUNTER — Encounter: Payer: Self-pay | Admitting: *Deleted

## 2014-05-04 ENCOUNTER — Ambulatory Visit (INDEPENDENT_AMBULATORY_CARE_PROVIDER_SITE_OTHER): Payer: MEDICARE | Admitting: *Deleted

## 2014-05-04 DIAGNOSIS — E538 Deficiency of other specified B group vitamins: Secondary | ICD-10-CM

## 2014-05-04 NOTE — Patient Instructions (Signed)

## 2014-05-04 NOTE — Progress Notes (Signed)
Vitamin b12 injection given and tolerated well.  

## 2014-05-27 ENCOUNTER — Ambulatory Visit: Payer: MEDICARE | Admitting: Family Medicine

## 2014-05-31 ENCOUNTER — Ambulatory Visit (INDEPENDENT_AMBULATORY_CARE_PROVIDER_SITE_OTHER): Payer: MEDICARE | Admitting: Family Medicine

## 2014-05-31 ENCOUNTER — Encounter: Payer: Self-pay | Admitting: Family Medicine

## 2014-05-31 VITALS — BP 157/69 | HR 80 | Temp 97.1°F | Ht 72.0 in | Wt 184.0 lb

## 2014-05-31 DIAGNOSIS — I25810 Atherosclerosis of coronary artery bypass graft(s) without angina pectoris: Secondary | ICD-10-CM | POA: Diagnosis not present

## 2014-05-31 DIAGNOSIS — E559 Vitamin D deficiency, unspecified: Secondary | ICD-10-CM | POA: Diagnosis not present

## 2014-05-31 DIAGNOSIS — E538 Deficiency of other specified B group vitamins: Secondary | ICD-10-CM | POA: Diagnosis not present

## 2014-05-31 DIAGNOSIS — L989 Disorder of the skin and subcutaneous tissue, unspecified: Secondary | ICD-10-CM | POA: Diagnosis not present

## 2014-05-31 DIAGNOSIS — E785 Hyperlipidemia, unspecified: Secondary | ICD-10-CM

## 2014-05-31 DIAGNOSIS — R5383 Other fatigue: Secondary | ICD-10-CM | POA: Diagnosis not present

## 2014-05-31 DIAGNOSIS — Z8546 Personal history of malignant neoplasm of prostate: Secondary | ICD-10-CM | POA: Diagnosis not present

## 2014-05-31 DIAGNOSIS — D7589 Other specified diseases of blood and blood-forming organs: Secondary | ICD-10-CM

## 2014-05-31 DIAGNOSIS — I10 Essential (primary) hypertension: Secondary | ICD-10-CM

## 2014-05-31 LAB — POCT CBC
Granulocyte percent: 62.2 %G (ref 37–80)
HCT, POC: 41.4 % — AB (ref 43.5–53.7)
Hemoglobin: 13.2 g/dL — AB (ref 14.1–18.1)
LYMPH, POC: 2.6 (ref 0.6–3.4)
MCH, POC: 31.9 pg — AB (ref 27–31.2)
MCHC: 31.9 g/dL (ref 31.8–35.4)
MCV: 100 fL — AB (ref 80–97)
MPV: 7.7 fL (ref 0–99.8)
POC GRANULOCYTE: 4.5 (ref 2–6.9)
POC LYMPH PERCENT: 35.5 %L (ref 10–50)
Platelet Count, POC: 260 10*3/uL (ref 142–424)
RBC: 4.13 M/uL — AB (ref 4.69–6.13)
RDW, POC: 13.5 %
WBC: 7.2 10*3/uL (ref 4.6–10.2)

## 2014-05-31 LAB — POCT GLYCOSYLATED HEMOGLOBIN (HGB A1C)

## 2014-05-31 NOTE — Patient Instructions (Addendum)
Medicare Annual Wellness Visit  [facility] and the medical providers at [facility] strive to bring you the best medical care.  In doing so we not only want to address your current medical conditions and concerns but also to detect new conditions early and prevent illness, disease and health-related problems.    Medicare offers a yearly Wellness Visit which allows our clinical staff to assess your need for preventative services including immunizations, lifestyle education, counseling to decrease risk of preventable diseases and screening for fall risk and other medical concerns.    This visit is provided free of charge (no copay) for all Medicare recipients. The clinical pharmacists at [facility] have begun to conduct these Wellness Visits which will also include a thorough review of all your medications.    As you primary medical provider recommend that you make an appointment for your Annual Wellness Visit if you have not done so already this year.  You may set up this appointment before you leave today or you may call back [phone]) and schedule an appointment.  Please make sure when you call that you mention that you are scheduling your Annual Wellness Visit with the clinical pharmacist so that the appointment may be made for the proper length of time.     Continue current medications. Continue good therapeutic lifestyle changes which include good diet and exercise. Fall precautions discussed with patient. If an FOBT was given today- please return it to our front desk. If you are over 51 years old - you may need Prevnar 68 or the adult Pneumonia vaccine.  Flu Shots are still available at our office. If you still haven't had one please call to set up a nurse visit to get one.   After your visit with [name] today you will receive a survey in the mail or online from [employer] regarding your care with [name]. Please take a moment to  fill this out. Your feedback is very important to [name] as you can help [name] better understand your patient needs as well as improve your experience and satisfaction. WE CARE ABOUT YOU!!!   If You change your mind and your fatigue continues, please consider getting a sleep apnea evaluation. Please call us and we will arrange for this. We will arrange for you to see the dermatologist because of the multiple actinic keratoses. We will make sure that we send a copy of your lab work to Dr. Earlie Server Continue to walk and exercise regularly Keep appointment with Dr. Ardis Rowan

## 2014-05-31 NOTE — Addendum Note (Signed)
Addended by: Zannie Cove on: 05/31/2014 12:48 PM   Modules accepted: Orders

## 2014-05-31 NOTE — Progress Notes (Signed)
Subjective:    Patient ID: Jon Reid, male    DOB: [birth year], 81 y.o.   MRN: [id number]  HPI  Pt here for follow up and management of chronic medical problems which includes hypertension, CAD, and Hyperlipidemia. He is taking medications regularly. The patient complains today of increased fatigue and even to the point where he falls asleep during the day. He brings in outside blood pressures which all looked good and are lower than the ones done in the office today. He also brings in some blood sugar readings and we will make sure that we get an A1c today. Some the readings fasting have been up in the 120 to 1:30 range.          Patient Active Problem List   Diagnosis Date Noted  . Gynecomastia 04/09/2013  . Gynecomastia, male 04/09/2013  . Coronary artery disease 02/02/2013  . 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 Myelodysplastic syndrome 11/06/2012  . Accelerating angina 11/06/2012  . Vitamin B 12 deficiency 07/07/2012  . Hyperlipemia 07/07/2012  . Macrocytosis without anemia 01/08/2012  . PVD 04/14/2009  . CARCINOMA, PROSTATE 04/13/2009  . VASCULAR PURPURA 04/13/2009  . Benign essential HTN 04/13/2009  . RENAL INSUFFICIENCY 04/13/2009  . SLEEP APNEA 04/13/2009  . ELECTROCARDIOGRAM, ABNORMAL 04/13/2009   Outpatient Encounter Prescriptions as of 05/31/2014  Medication Sig  . aspirin EC 325 MG EC tablet Take 1 tablet (325 mg total) by mouth daily.  . cholecalciferol (VITAMIN D) 1000 UNITS tablet Take 2,000 Units by mouth daily.  . colchicine (COLCRYS) 0.6 MG tablet Take 0.6 mg by mouth as needed.  . furosemide (LASIX) 40 MG tablet Take 1 tablet (40 mg total) by mouth daily. Take one three times a week  . metoprolol succinate (TOPROL-XL) 25 MG 24 hr tablet TAKE 1 TABLET ONCE A DAY  . triamcinolone cream (KENALOG) 0.1 % Apply 1 application topically 2 (two) times daily.  . nitroGLYCERIN (NITROSTAT) 0.4 MG SL tablet Place 0.4 mg under the tongue every 5 (five) minutes as needed for  chest pain.    Review of Systems  Constitutional: Positive for fatigue (falls asleep during the day).  HENT: Negative.   Eyes: Negative.   Respiratory: Negative.   Cardiovascular: Negative.   Gastrointestinal: Negative.   Endocrine: Negative.   Genitourinary: Negative.   Musculoskeletal: Negative.   Skin: Negative.   Allergic/Immunologic: Negative.   Neurological: Negative.   Hematological: Negative.   Psychiatric/Behavioral: Negative.        Objective:   Physical Exam  Constitutional: He is oriented to person, place, and time. He appears well-developed and well-nourished. No distress.  HENT:  Head: Normocephalic and atraumatic.  Right Ear: External ear normal.  Left Ear: External ear normal.  Nose: Nose normal.  Mouth/Throat: Oropharynx is clear and moist. No oropharyngeal exudate.  Eyes: Conjunctivae and EOM are normal. Pupils are equal, round, and reactive to light. Right eye exhibits no discharge. Left eye exhibits no discharge. No scleral icterus.  Neck: Normal range of motion. Neck supple. No thyromegaly present.  Cardiovascular: Normal rate, regular rhythm and intact distal pulses.  Exam reveals no gallop and no friction rub.   Murmur (the patient has a grade 3/6 systolic ejection murmur) heard. At 72/m  Pulmonary/Chest: Effort normal and breath sounds normal. No respiratory distress. He has no wheezes. He has no rales. He exhibits no tenderness.  Breast sounds are good anteriorly and posteriorly and there is no axillary adenopathy  Abdominal: Soft. Bowel sounds are normal.  He exhibits no mass. There is no tenderness. There is no rebound and no guarding.  The abdomen is nontender without masses or organ enlargement. There is no inguinal adenopathy.  Genitourinary:  He is followed by [facility] regularly  Musculoskeletal: Normal range of motion. He exhibits no edema or tenderness.  Lymphadenopathy:    He has no cervical adenopathy.  Neurological: He is alert  and oriented to person, place, and time. He has normal reflexes. No cranial nerve deficit.  Skin: Skin is warm and dry. No rash noted. No erythema. No pallor.  The patient has multiple seborrheic keratoses and actinic keratoses and some dysplastic nevi. He is agreeable to going and seeing the dermatologist for further evaluation of the skin lesions and we will arrange that.  Psychiatric: He has a normal mood and affect. His behavior is normal. Judgment and thought content normal.  Nursing note and vitals reviewed.  BP 157/69 mmHg  Pulse 80  Temp(Src) 97.1 F (36.2 C) (Oral)  Ht 6' (1.829 m)  Wt 184 lb (83.462 kg)  BMI 24.95 kg/m2  Outside blood pressures brought in for review were good. Several blood sugars however were elevated.      Assessment & Plan:  1. Hyperlipemia -This will be checked today. The patient was intolerant to Crestor in the past and according to the cardiologist may have contributed to some of his breast tenderness and enlargement and this was discontinued. He should be encouraged to continue to walk daily. - POCT CBC - NMR, lipoprofile - POCT glycosylated hemoglobin (Hb A1C)  2. Vitamin B 12 deficiency -He should  continue to get his B12 injections and we will monitor the level as well as his hemoglobin today. - POCT CBC - Vitamin B12  3. Benign essential HTN -This is been under good control at home but is slightly elevated in the office today. - POCT CBC - BMP8+EGFR - Hepatic function panel  4. Vitamin D deficiency -This will be monitored and the patient should continue with his current treatment until blood work is returned - POCT CBC - Vit D  25 hydroxy (rtn osteoporosis monitoring)  5. Coronary artery disease involving autologous vein coronary bypass graft without angina pectoris -He should continue with regular follow-up with cardiology. - POCT CBC - BMP8+EGFR - Hepatic function panel - NMR, lipoprofile  6. History of prostate cancer -He  should continue follow-up with urology. - POCT CBC  7. Other fatigue -We will check appropriate blood work today including a fecal occult blood test card. -We also encouraged him to get a sleep apnea evaluation and he declined this. - POCT CBC - BMP8+EGFR - Hepatic function panel - NMR, lipoprofile - Vit D  25 hydroxy (rtn osteoporosis monitoring) - Thyroid Panel With TSH - Vitamin B12 - POCT glycosylated hemoglobin (Hb A1C)  8. Macrocytosis -He does not see Dr. Inda Merlin anymore but we will make sure we send a copy of the lab work to him for review  No orders of the defined types were placed in this encounter.   Patient Instructions                       Medicare Annual Wellness Visit  [facility] and the medical providers at [facility] strive to bring you the best medical care.  In doing so we not only want to address your current medical conditions and concerns but also to detect new conditions early and prevent illness, disease and health-related  problems.    Medicare offers a yearly Wellness Visit which allows our clinical staff to assess your need for preventative services including immunizations, lifestyle education, counseling to decrease risk of preventable diseases and screening for fall risk and other medical concerns.    This visit is provided free of charge (no copay) for all Medicare recipients. The clinical pharmacists at [facility] have begun to conduct these Wellness Visits which will also include a thorough review of all your medications.    As you primary medical provider recommend that you make an appointment for your Annual Wellness Visit if you have not done so already this year.  You may set up this appointment before you leave today or you may call back ([phone]) and schedule an appointment.  Please make sure when you call that you mention that you are scheduling your Annual Wellness Visit with the clinical  pharmacist so that the appointment may be made for the proper length of time.     Continue current medications. Continue good therapeutic lifestyle changes which include good diet and exercise. Fall precautions discussed with patient. If an FOBT was given today- please return it to our front desk. If you are over 11 years old - you may need Prevnar 26 or the adult Pneumonia vaccine.  Flu Shots are still available at our office. If you still haven't had one please call to set up a nurse visit to get one.   After your visit with [name] today you will receive a survey in the mail or online from [employer] regarding your care with [name]. Please take a moment to fill this out. Your feedback is very important to [name] as you can help [name] better understand your patient needs as well as improve your experience and satisfaction. WE CARE ABOUT YOU!!!   If You change your mind and your fatigue continues, please consider getting a sleep apnea evaluation. Please call us and we will arrange for this. We will arrange for you to see the dermatologist because of the multiple actinic keratoses. We will make sure that we send a copy of your lab work to Dr. Earlie Server Continue to walk and exercise regularly Keep appointment with Dr. Ardis Rowan    Arrie Senate MD

## 2014-06-01 LAB — NMR, LIPOPROFILE
Cholesterol: 173 mg/dL (ref 100–199)
HDL Cholesterol by NMR: 66 mg/dL (ref >39)
HDL PARTICLE NUMBER: 31.7 umol/L (ref >=30.5)
LDL Particle Number: 784 nmol/L (ref <1000)
LDL SIZE: 21.2 nm (ref >20.5)
LDL-C: 92 mg/dL (ref 0–99)
Small LDL Particle Number: 173 nmol/L (ref <=527)
Triglycerides by NMR: 76 mg/dL (ref 0–149)

## 2014-06-01 LAB — BMP8+EGFR
BUN/Creatinine Ratio: 17 (ref 10–22)
BUN: 21 mg/dL (ref 8–27)
CALCIUM: 9.3 mg/dL (ref 8.6–10.2)
CHLORIDE: 101 mmol/L (ref 97–108)
CO2: 24 mmol/L (ref 18–29)
Creatinine, Ser: 1.25 mg/dL (ref 0.76–1.27)
GFR calc Af Amer: 61 mL/min/{1.73_m2} (ref >59)
GFR calc non Af Amer: 53 mL/min/{1.73_m2} — ABNORMAL LOW (ref >59)
Glucose: 104 mg/dL — ABNORMAL HIGH (ref 65–99)
POTASSIUM: 4.1 mmol/L (ref 3.5–5.2)
SODIUM: 142 mmol/L (ref 134–144)

## 2014-06-01 LAB — HEPATIC FUNCTION PANEL
ALBUMIN: 4.8 g/dL — AB (ref 3.5–4.7)
ALT: 12 IU/L (ref 0–44)
AST: 24 IU/L (ref 0–40)
Alkaline Phosphatase: 69 IU/L (ref 39–117)
Bilirubin Total: 0.8 mg/dL (ref 0.0–1.2)
Bilirubin, Direct: 0.2 mg/dL (ref 0.00–0.40)
Total Protein: 7.2 g/dL (ref 6.0–8.5)

## 2014-06-01 LAB — VITAMIN D 25 HYDROXY (VIT D DEFICIENCY, FRACTURES): VIT D 25 HYDROXY: 36.3 ng/mL (ref 30.0–100.0)

## 2014-06-01 LAB — THYROID PANEL WITH TSH
Free Thyroxine Index: 1.9 (ref 1.2–4.9)
T3 UPTAKE RATIO: 28 % (ref 24–39)
T4, Total: 6.8 ug/dL (ref 4.5–12.0)
TSH: 2.19 u[IU]/mL (ref 0.450–4.500)

## 2014-06-01 LAB — VITAMIN B12: Vitamin B-12: 837 pg/mL (ref 211–946)

## 2014-06-03 ENCOUNTER — Other Ambulatory Visit: Payer: MEDICARE

## 2014-06-03 ENCOUNTER — Ambulatory Visit (INDEPENDENT_AMBULATORY_CARE_PROVIDER_SITE_OTHER): Payer: MEDICARE | Admitting: *Deleted

## 2014-06-03 DIAGNOSIS — Z1212 Encounter for screening for malignant neoplasm of rectum: Secondary | ICD-10-CM | POA: Diagnosis not present

## 2014-06-03 DIAGNOSIS — E538 Deficiency of other specified B group vitamins: Secondary | ICD-10-CM | POA: Diagnosis not present

## 2014-06-03 NOTE — Progress Notes (Signed)
Lab only 

## 2014-06-05 LAB — FECAL OCCULT BLOOD, IMMUNOCHEMICAL: Fecal Occult Bld: NEGATIVE

## 2014-06-23 DIAGNOSIS — L821 Other seborrheic keratosis: Secondary | ICD-10-CM | POA: Diagnosis not present

## 2014-06-23 DIAGNOSIS — D225 Melanocytic nevi of trunk: Secondary | ICD-10-CM | POA: Diagnosis not present

## 2014-06-23 DIAGNOSIS — L570 Actinic keratosis: Secondary | ICD-10-CM | POA: Diagnosis not present

## 2014-06-23 DIAGNOSIS — L814 Other melanin hyperpigmentation: Secondary | ICD-10-CM | POA: Diagnosis not present

## 2014-07-12 ENCOUNTER — Ambulatory Visit (INDEPENDENT_AMBULATORY_CARE_PROVIDER_SITE_OTHER): Payer: MEDICARE | Admitting: *Deleted

## 2014-07-12 DIAGNOSIS — E538 Deficiency of other specified B group vitamins: Secondary | ICD-10-CM

## 2014-07-12 MED ORDER — CYANOCOBALAMIN 1000 MCG/ML IJ SOLN
1000.0000 ug | INTRAMUSCULAR | Status: AC
  Administered 2014-07-12 – 2015-07-07 (×12): 1000 ug via INTRAMUSCULAR

## 2014-07-12 NOTE — Patient Instructions (Signed)

## 2014-07-12 NOTE — Progress Notes (Signed)
Vitamin b12 injection given and tolerated well.  

## 2014-08-11 ENCOUNTER — Ambulatory Visit (INDEPENDENT_AMBULATORY_CARE_PROVIDER_SITE_OTHER): Payer: MEDICARE | Admitting: Cardiology

## 2014-08-11 ENCOUNTER — Encounter: Payer: Self-pay | Admitting: Cardiology

## 2014-08-11 VITALS — BP 132/72 | HR 60 | Ht 72.0 in | Wt 187.0 lb

## 2014-08-11 DIAGNOSIS — I25810 Atherosclerosis of coronary artery bypass graft(s) without angina pectoris: Secondary | ICD-10-CM | POA: Diagnosis not present

## 2014-08-11 NOTE — Progress Notes (Signed)
HPI The the patient had recent dyspnea and chest discomfort. terization demonstrated severe 3 vessel CAD and normal LV function. He was referred for CABG. He underwent CABG x4 with Dr. Lucianne Lei Trigt (LIMA-LAD, SVG-OM1, SVG-PDA). Postoperative course was complicated by blood loss anemia requiring transfusion with PRBCs. Echocardiogram 11/13/12 demonstrated an EF 55-60%.  Carotid US 11/08/12 demonstrated bilateral 1-39% stenosis.  He does have mild AS on echo.  He gynecomastia related to Crestor and the discomfort is gone away since this medicine stopped. He remains very active working on his yard.  The patient denies any new symptoms such as chest discomfort, neck or arm discomfort. There has been no new shortness of breath, PND or orthopnea. There have been no reported palpitations, presyncope or syncope., Gynecomastia reduced but still present  Allergies  Allergen Reactions  . Crestor [Rosuvastatin]     GYNECOMASTIA AND SWELLING    Current Outpatient Prescriptions  Medication Sig Dispense Refill  . aspirin EC 325 MG EC tablet Take 1 tablet (325 mg total) by mouth daily. 30 tablet 0  . cholecalciferol (VITAMIN D) 1000 UNITS tablet Take 2,000 Units by mouth daily.    . colchicine (COLCRYS) 0.6 MG tablet Take 0.6 mg by mouth daily.     . furosemide (LASIX) 40 MG tablet Take 1 tablet (40 mg total) by mouth daily. Take one three times a week 30 tablet 5  . metoprolol succinate (TOPROL-XL) 25 MG 24 hr tablet TAKE 1 TABLET ONCE A DAY 30 tablet 11  . nitroGLYCERIN (NITROSTAT) 0.4 MG SL tablet Place 0.4 mg under the tongue every 5 (five) minutes as needed for chest pain.    Marland Kitchen triamcinolone cream (KENALOG) 0.1 % Apply 1 application topically 2 (two) times daily. 30 g 3   Current Facility-Administered Medications  Medication Dose Route Frequency Provider Last Rate Last Dose  . cyanocobalamin ((VITAMIN B-12)) injection 1,000 mcg  1,000 mcg Intramuscular Q30 days Chipper Herb, MD   1,000 mcg at 07/12/14  1113    Past Medical History  Diagnosis Date  . HTN (hypertension)   . CKD (chronic kidney disease) stage 2, GFR 60-89 ml/min   . Sleep apnea     Questionable  . Carcinoma of prostate   . GERD (gastroesophageal reflux disease)   . Gout   . Coronary artery disease     a. LHC (8/14):  3v CAD => s/p CABG (LIMA-LAD, SVG-OM1, SVG-PDA)  . Hx of echocardiogram     Echocardiogram 11/13/12: EF 55-60%, normal wall motion, biatrial enlargement, mild AI, mild TR, no pericardial effusion    Past Surgical History  Procedure Laterality Date  . Retropubic prostatectomy      Radical  . Lymph node dissection      Bilateral pelvic  . Cardiac catheterization  11/07/2012    Dr Acie Fredrickson  . Coronary artery bypass graft N/A 11/11/2012    Procedure: CORONARY ARTERY BYPASS GRAFTING times four using Right Greater Saphenous Vein Graft harvested endoscopically and Left Internal Mammary Artery.;  Surgeon: Ivin Poot, MD;  Location: [facility];  Service: Open Heart Surgery;  Laterality: N/A;  . Intraoperative transesophageal echocardiogram N/A 11/11/2012    Procedure: INTRAOPERATIVE TRANSESOPHAGEAL ECHOCARDIOGRAM;  Surgeon: Ivin Poot, MD;  Location: [facility];  Service: Open Heart Surgery;  Laterality: N/A;  . Left heart catheterization with coronary angiogram N/A 11/07/2012    Procedure: LEFT HEART CATHETERIZATION WITH CORONARY ANGIOGRAM;  Surgeon: Thayer Headings, MD;  Location: [facility] CATH LAB;  Service: Cardiovascular;  Laterality: N/A;    ROS:  As stated in the HPI and negative for all other systems.  PHYSICAL EXAM BP 132/72 mmHg  Pulse 60  Ht 6' (1.829 m)  Wt 187 lb (84.823 kg)  BMI 25.36 kg/m2 GENERAL:  Well appearing HEENT:  Pupils equal round and reactive, fundi not visualized, oral mucosa unremarkable NECK:  No jugular venous distention, waveform within normal limits, carotid upstroke brisk and symmetric, right bruits, no thyromegaly LYMPHATICS:  No cervical, inguinal adenopathy LUNGS:  Clear to  auscultation bilaterally BACK:  No CVA tenderness CHEST:  Well healed sternotomy scar.  HEART:  PMI not displaced or sustained,S1 and S2 within normal limits, no S3, no S4, no clicks, no rubs, apical systolic murmur early peaking, no diastolic murmurs ABD:  Flat, positive bowel sounds normal in frequency in pitch, no bruits, no rebound, no guarding, no midline pulsatile mass, no hepatomegaly, no splenomegaly EXT:  2 plus pulses throughout, mild ankle edema, no cyanosis no clubbing  EKG:  Sinus rhythm, rate 60, axis within normal limits, intervals within normal limits, no acute ST-T wave changes. 08/11/2014  ASSESSMENT AND PLAN  CAD:  The patient has no new sypmtoms.  No further cardiovascular testing is indicated.  We will continue with aggressive risk reduction and meds as listed.  DYSLIPIDEMIA:  He didn't tolerate Crestor is currently not on any medications and I will defer to Dr. Laurance Flatten.   CAROTID STENOSIS:  This was mild and will be followed in a couple of years with repeat Dopplers.  HTN:  The blood pressure is at target. No change in medications is indicated. We will continue with therapeutic lifestyle changes (TLC).  AS:  This

## 2014-08-11 NOTE — Patient Instructions (Signed)
Medication Instructions:  Your physician recommends that you continue on your current medications as directed. Please refer to the Current Medication list given to you today.  Follow-Up: Follow up in 1 year with Dr. Hochrein.  You will receive a letter in the mail 2 months before you are due.  Please call us when you receive this letter to schedule your follow up appointment.   Thank you for choosing [facility]!!       

## 2014-08-12 ENCOUNTER — Ambulatory Visit (INDEPENDENT_AMBULATORY_CARE_PROVIDER_SITE_OTHER): Payer: MEDICARE | Admitting: *Deleted

## 2014-08-12 DIAGNOSIS — E538 Deficiency of other specified B group vitamins: Secondary | ICD-10-CM | POA: Diagnosis not present

## 2014-08-12 NOTE — Progress Notes (Signed)
Pt given B12 injection IM left deltoid and tolerated well. °

## 2014-08-12 NOTE — Patient Instructions (Signed)

## 2014-09-14 ENCOUNTER — Ambulatory Visit (INDEPENDENT_AMBULATORY_CARE_PROVIDER_SITE_OTHER): Payer: MEDICARE | Admitting: *Deleted

## 2014-09-14 DIAGNOSIS — E538 Deficiency of other specified B group vitamins: Secondary | ICD-10-CM

## 2014-09-14 NOTE — Progress Notes (Signed)
Patient tolerated well.

## 2014-09-23 DIAGNOSIS — L570 Actinic keratosis: Secondary | ICD-10-CM | POA: Diagnosis not present

## 2014-10-12 ENCOUNTER — Encounter: Payer: Self-pay | Admitting: *Deleted

## 2014-10-12 ENCOUNTER — Ambulatory Visit (INDEPENDENT_AMBULATORY_CARE_PROVIDER_SITE_OTHER): Payer: MEDICARE | Admitting: Family Medicine

## 2014-10-12 ENCOUNTER — Encounter: Payer: Self-pay | Admitting: Family Medicine

## 2014-10-12 VITALS — BP 166/79 | HR 67 | Temp 97.4°F | Ht 72.0 in | Wt 178.6 lb

## 2014-10-12 DIAGNOSIS — E538 Deficiency of other specified B group vitamins: Secondary | ICD-10-CM

## 2014-10-12 DIAGNOSIS — I25810 Atherosclerosis of coronary artery bypass graft(s) without angina pectoris: Secondary | ICD-10-CM

## 2014-10-12 DIAGNOSIS — E785 Hyperlipidemia, unspecified: Secondary | ICD-10-CM

## 2014-10-12 DIAGNOSIS — E559 Vitamin D deficiency, unspecified: Secondary | ICD-10-CM | POA: Diagnosis not present

## 2014-10-12 DIAGNOSIS — I10 Essential (primary) hypertension: Secondary | ICD-10-CM

## 2014-10-12 DIAGNOSIS — J301 Allergic rhinitis due to pollen: Secondary | ICD-10-CM | POA: Diagnosis not present

## 2014-10-12 DIAGNOSIS — K439 Ventral hernia without obstruction or gangrene: Secondary | ICD-10-CM | POA: Insufficient documentation

## 2014-10-12 LAB — POCT CBC
Granulocyte percent: 61.4 %G (ref 37–80)
HCT, POC: 41.9 % — AB (ref 43.5–53.7)
Hemoglobin: 13.7 g/dL — AB (ref 14.1–18.1)
Lymph, poc: 2.1 (ref 0.6–3.4)
MCH, POC: 32.8 pg — AB (ref 27–31.2)
MCHC: 32.6 g/dL (ref 31.8–35.4)
MCV: 100.6 fL — AB (ref 80–97)
MPV: 6.9 fL (ref 0–99.8)
POC Granulocyte: 4 (ref 2–6.9)
POC LYMPH PERCENT: 32.7 %L (ref 10–50)
Platelet Count, POC: 252 10*3/uL (ref 142–424)
RBC: 4.17 M/uL — AB (ref 4.69–6.13)
RDW, POC: 13.4 %
WBC: 6.5 10*3/uL (ref 4.6–10.2)

## 2014-10-12 MED ORDER — FLUTICASONE PROPIONATE 50 MCG/ACT NA SUSP: NASAL | Status: DC

## 2014-10-12 NOTE — Progress Notes (Signed)
Subjective:    Patient ID: Jon Reid, male    DOB: [birth year], 81 y.o.   MRN: [id number]  HPI  Pt here for follow up and management of chronic medical problems.  Which hypertension, hyperlipidemia, and CAD. He is taking medications regularly.      Patient Active Problem List   Diagnosis Date Noted  . Gynecomastia 04/09/2013  . Gynecomastia, male 04/09/2013  . Coronary artery disease 02/02/2013  . 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 Myelodysplastic syndrome 11/06/2012  . Accelerating angina 11/06/2012  . Vitamin B 12 deficiency 07/07/2012  . Hyperlipemia 07/07/2012  . Macrocytosis without anemia 01/08/2012  . PVD 04/14/2009  . CARCINOMA, PROSTATE 04/13/2009  . VASCULAR PURPURA 04/13/2009  . Benign essential HTN 04/13/2009  . RENAL INSUFFICIENCY 04/13/2009  . SLEEP APNEA 04/13/2009  . ELECTROCARDIOGRAM, ABNORMAL 04/13/2009   Outpatient Encounter Prescriptions as of 10/12/2014  Medication Sig  . aspirin EC 325 MG EC tablet Take 1 tablet (325 mg total) by mouth daily.  . cholecalciferol (VITAMIN D) 1000 UNITS tablet Take 2,000 Units by mouth daily.  . furosemide (LASIX) 40 MG tablet Take 1 tablet (40 mg total) by mouth daily. Take one three times a week  . metoprolol succinate (TOPROL-XL) 25 MG 24 hr tablet TAKE 1 TABLET ONCE A DAY  . nitroGLYCERIN (NITROSTAT) 0.4 MG SL tablet Place 0.4 mg under the tongue every 5 (five) minutes as needed for chest pain.  Marland Kitchen triamcinolone cream (KENALOG) 0.1 % Apply 1 application topically 2 (two) times daily.  . [DISCONTINUED] colchicine (COLCRYS) 0.6 MG tablet Take 0.6 mg by mouth daily.    Facility-Administered Encounter Medications as of 10/12/2014  Medication  . cyanocobalamin ((VITAMIN B-12)) injection 1,000 mcg        Review of Systems  Constitutional: Negative.   HENT: Positive for voice change (Hoarseness).   Eyes: Negative.   Respiratory: Negative.   Cardiovascular: Negative.   Gastrointestinal: Negative.   Endocrine: Negative.    Genitourinary: Negative.   Musculoskeletal: Negative.   Skin: Negative.   Allergic/Immunologic: Negative.   Neurological: Negative.   Hematological: Negative.   Psychiatric/Behavioral: Negative.        Objective:   Physical Exam  Constitutional: He is oriented to person, place, and time. He appears well-developed and well-nourished.  The patient is pleasant and alert and appears younger than his stated age  HENT:  Head: Normocephalic and atraumatic.  Right Ear: External ear normal.  Left Ear: External ear normal.  Mouth/Throat: Oropharynx is clear and moist. No oropharyngeal exudate.  There is nasal congestion in the left nostril  Eyes: Conjunctivae and EOM are normal. Pupils are equal, round, and reactive to light. Right eye exhibits no discharge. Left eye exhibits no discharge. No scleral icterus.  Neck: Normal range of motion. Neck supple. No thyromegaly present.  There is a right carotid bruit which is most likely Secondary to his systolic ejection murmur from his heart  Cardiovascular: Normal rate, regular rhythm and intact distal pulses.  Exam reveals no gallop and no friction rub.   Murmur heard. The heart is regular at 60/m with a grade 2/6 systolic ejection murmur  Pulmonary/Chest: Effort normal and breath sounds normal. No respiratory distress. He has no wheezes. He has no rales. He exhibits no tenderness.  Clear anteriorly and posteriorly  Abdominal: Soft. Bowel sounds are normal. He exhibits no mass. There is no tenderness. There is no rebound and no guarding.  Agent does have a right lower quadrant hernia which  is more prominent with standing. The abdomen was nontender without masses or organ enlargement or bruits  Genitourinary:  This is followed regularly by the urologist  Musculoskeletal: Normal range of motion. He exhibits no edema or tenderness.  Lymphadenopathy:    He has no cervical adenopathy.  Neurological: He is alert and oriented to person, place, and  time. He has normal reflexes. No cranial nerve deficit.  Skin: Skin is warm and dry. No rash noted. No erythema. No pallor.  Psychiatric: He has a normal mood and affect. His behavior is normal. Judgment and thought content normal.  Nursing note and vitals reviewed.  BP 166/79 mmHg  Pulse 67  Temp(Src) 97.4 F (36.3 C) (Oral)  Ht 6' (1.829 m)  Wt 178 lb 9.6 oz (81.012 kg)  BMI 24.22 kg/m2  Repeat blood pressure was 162/78      Assessment & Plan:  1. Hyperlipemia -Continue aggressive therapeutic lifestyle changes which include exercise and diet - Hepatic function panel - NMR, lipoprofile - POCT CBC  2. Benign essential HTN -Continue to watch sodium intake and monitor blood pressures at home and bring these readings to the next visit. The blood pressures at home have been running in the 110-120 range over 50-60. - BMP8+EGFR - Hepatic function panel - POCT CBC  3. Vitamin D deficiency -Continue current vitamin D replacement pending results of lab work - Vit D  25 hydroxy (rtn osteoporosis monitoring) - POCT CBC  4. Coronary artery disease involving autologous vein coronary bypass graft without angina pectoris -Continue follow-up with cardiology - BMP8+EGFR - Hepatic function panel - NMR, lipoprofile - POCT CBC  5. Vitamin B 12 deficiency -We will check a B12 level today - POCT CBC  6. Allergic rhinitis due to pollen -Use Flonase regularly 1-2 sprays at nighttime - fluticasone (FLONASE) 50 MCG/ACT nasal spray; One to 2 sprays each nostril at bedtime  Dispense: 16 g; Refill: 6  Meds ordered this encounter  Medications  . fluticasone (FLONASE) 50 MCG/ACT nasal spray    Sig: One to 2 sprays each nostril at bedtime    Dispense:  16 g    Refill:  6   Patient Instructions  Continue current medications. Continue good therapeutic lifestyle changes which include good diet and exercise. Fall precautions discussed with patient. If an FOBT was given today- please return  it to our front desk. If you are over 54 years old - you may need Prevnar 23 or the adult Pneumonia vaccine.   After your visit with [name] today you will receive a survey in the mail or online from [employer] regarding your care with [name]. Please take a moment to fill this out. Your feedback is very important to [name] as you can help [name] better understand your patient needs as well as improve your experience and satisfaction. WE CARE ABOUT YOU!!!                          Medicare Annual Wellness Visit  [facility] and the medical providers at [facility] strive to bring you the best medical care.  In doing so we not only want to address your current medical conditions and concerns but also to detect new conditions early and prevent illness, disease and health-related problems.    Medicare offers a yearly Wellness Visit which allows our clinical staff to assess your need for preventative services including immunizations, lifestyle education, counseling to decrease risk of preventable diseases  and screening for fall risk and other medical concerns.    This visit is provided free of charge (no copay) for all Medicare recipients. The clinical pharmacists at [facility] have begun to conduct these Wellness Visits which will also include a thorough review of all your medications.    As you primary medical provider recommend that you make an appointment for your Annual Wellness Visit if you have not done so already this year.  You may set up this appointment before you leave today or you may call back ([phone]) and schedule an appointment.  Please make sure when you call that you mention that you are scheduling your Annual Wellness Visit with the clinical pharmacist so that the appointment may be made for the proper length of time.    Continue to exercise regularly Continue to follow-up with cardiology Please call and make an appointment for your colonoscopy Try  Zantac over-the-counter 1 twice daily 150 mg before breakfast and supper for one month Purchase Flonase over-the-counter and do 1-2 sprays each nostril at bedtime Drink plenty of fluids If hoarseness does not improve after 3-4 weeks please get back in touch with [name] Continue to monitor blood pressures at home and bring these readings to your next office visit Watch sodium intake Follow-up regularly with the urologist Please complete the patient's satisfaction survey that will be sent to you in the mail   Arrie Senate MD

## 2014-10-12 NOTE — Patient Instructions (Addendum)
Continue current medications. Continue good therapeutic lifestyle changes which include good diet and exercise. Fall precautions discussed with patient. If an FOBT was given today- please return it to our front desk. If you are over 79 years old - you may need Prevnar 39 or the adult Pneumonia vaccine.   After your visit with [name] today you will receive a survey in the mail or online from [employer] regarding your care with [name]. Please take a moment to fill this out. Your feedback is very important to [name] as you can help [name] better understand your patient needs as well as improve your experience and satisfaction. WE CARE ABOUT YOU!!!                          Medicare Annual Wellness Visit  [facility] and the medical providers at [facility] strive to bring you the best medical care.  In doing so we not only want to address your current medical conditions and concerns but also to detect new conditions early and prevent illness, disease and health-related problems.    Medicare offers a yearly Wellness Visit which allows our clinical staff to assess your need for preventative services including immunizations, lifestyle education, counseling to decrease risk of preventable diseases and screening for fall risk and other medical concerns.    This visit is provided free of charge (no copay) for all Medicare recipients. The clinical pharmacists at [facility] have begun to conduct these Wellness Visits which will also include a thorough review of all your medications.    As you primary medical provider recommend that you make an appointment for your Annual Wellness Visit if you have not done so already this year.  You may set up this appointment before you leave today or you may call back ([phone]) and schedule an appointment.  Please make sure when you call that you mention that you are scheduling your Annual Wellness Visit with the clinical pharmacist so that  the appointment may be made for the proper length of time.    Continue to exercise regularly Continue to follow-up with cardiology Please call and make an appointment for your colonoscopy Try Zantac over-the-counter 1 twice daily 150 mg before breakfast and supper for one month Purchase Flonase over-the-counter and do 1-2 sprays each nostril at bedtime Drink plenty of fluids If hoarseness does not improve after 3-4 weeks please get back in touch with [name] Continue to monitor blood pressures at home and bring these readings to your next office visit Watch sodium intake Follow-up regularly with the urologist Please complete the patient's satisfaction survey that will be sent to you in the mail

## 2014-10-13 LAB — NMR, LIPOPROFILE
Cholesterol: 165 mg/dL (ref 100–199)
HDL Cholesterol by NMR: 57 mg/dL (ref >39)
HDL Particle Number: 28.1 umol/L — ABNORMAL LOW (ref >=30.5)
LDL Particle Number: 977 nmol/L (ref <1000)
LDL Size: 20.8 nm (ref >20.5)
LDL-C: 94 mg/dL (ref 0–99)
SMALL LDL PARTICLE NUMBER: 380 nmol/L (ref <=527)
TRIGLYCERIDES BY NMR: 72 mg/dL (ref 0–149)

## 2014-10-13 LAB — BMP8+EGFR
BUN/Creatinine Ratio: 13 (ref 10–22)
BUN: 18 mg/dL (ref 8–27)
CHLORIDE: 101 mmol/L (ref 97–108)
CO2: 23 mmol/L (ref 18–29)
Calcium: 9.1 mg/dL (ref 8.6–10.2)
Creatinine, Ser: 1.36 mg/dL — ABNORMAL HIGH (ref 0.76–1.27)
GFR calc Af Amer: 55 mL/min/{1.73_m2} — ABNORMAL LOW (ref >59)
GFR, EST NON AFRICAN AMERICAN: 47 mL/min/{1.73_m2} — AB (ref >59)
Glucose: 103 mg/dL — ABNORMAL HIGH (ref 65–99)
Potassium: 4 mmol/L (ref 3.5–5.2)
Sodium: 141 mmol/L (ref 134–144)

## 2014-10-13 LAB — HEPATIC FUNCTION PANEL
ALBUMIN: 4.6 g/dL (ref 3.5–4.7)
ALT: 14 IU/L (ref 0–44)
AST: 18 IU/L (ref 0–40)
Alkaline Phosphatase: 67 IU/L (ref 39–117)
BILIRUBIN, DIRECT: 0.2 mg/dL (ref 0.00–0.40)
Bilirubin Total: 0.8 mg/dL (ref 0.0–1.2)
Total Protein: 6.8 g/dL (ref 6.0–8.5)

## 2014-10-13 LAB — VITAMIN D 25 HYDROXY (VIT D DEFICIENCY, FRACTURES): Vit D, 25-Hydroxy: 39.6 ng/mL (ref 30.0–100.0)

## 2014-10-15 ENCOUNTER — Ambulatory Visit: Payer: MEDICARE

## 2014-10-15 ENCOUNTER — Ambulatory Visit (INDEPENDENT_AMBULATORY_CARE_PROVIDER_SITE_OTHER): Payer: MEDICARE | Admitting: *Deleted

## 2014-10-15 VITALS — BP 153/74 | HR 62

## 2014-10-15 DIAGNOSIS — E538 Deficiency of other specified B group vitamins: Secondary | ICD-10-CM

## 2014-10-15 NOTE — Progress Notes (Signed)
Patient tolerated well.   Patient complains of some weakness and lightheadedness since this morning. It has improved though. Brought in blood pressure readings which were pretty stable. Offered to have him see a provider but he declines at this time. He will call back if symptoms worsen or persist.

## 2014-10-15 NOTE — Patient Instructions (Signed)

## 2014-11-16 ENCOUNTER — Ambulatory Visit (INDEPENDENT_AMBULATORY_CARE_PROVIDER_SITE_OTHER): Payer: MEDICARE | Admitting: *Deleted

## 2014-11-16 DIAGNOSIS — E538 Deficiency of other specified B group vitamins: Secondary | ICD-10-CM | POA: Diagnosis not present

## 2014-11-16 NOTE — Patient Instructions (Signed)

## 2014-11-16 NOTE — Progress Notes (Signed)
Vitamin b12 injection given and patient tolerated well.  

## 2014-12-20 ENCOUNTER — Ambulatory Visit (INDEPENDENT_AMBULATORY_CARE_PROVIDER_SITE_OTHER): Payer: MEDICARE | Admitting: *Deleted

## 2014-12-20 DIAGNOSIS — E538 Deficiency of other specified B group vitamins: Secondary | ICD-10-CM | POA: Diagnosis not present

## 2014-12-20 NOTE — Patient Instructions (Signed)

## 2014-12-20 NOTE — Progress Notes (Signed)
Pt given B12 injection IM left deltoid and tolerated well. °

## 2014-12-21 DIAGNOSIS — S0121XA Laceration without foreign body of nose, initial encounter: Secondary | ICD-10-CM | POA: Diagnosis not present

## 2014-12-21 DIAGNOSIS — W19XXXA Unspecified fall, initial encounter: Secondary | ICD-10-CM | POA: Diagnosis not present

## 2014-12-21 DIAGNOSIS — W010XXA Fall on same level from slipping, tripping and stumbling without subsequent striking against object, initial encounter: Secondary | ICD-10-CM | POA: Diagnosis not present

## 2014-12-21 DIAGNOSIS — S00511A Abrasion of lip, initial encounter: Secondary | ICD-10-CM | POA: Diagnosis not present

## 2014-12-21 DIAGNOSIS — S0990XA Unspecified injury of head, initial encounter: Secondary | ICD-10-CM | POA: Diagnosis not present

## 2014-12-28 ENCOUNTER — Ambulatory Visit (INDEPENDENT_AMBULATORY_CARE_PROVIDER_SITE_OTHER): Payer: MEDICARE | Admitting: Family Medicine

## 2014-12-28 ENCOUNTER — Encounter: Payer: Self-pay | Admitting: Family Medicine

## 2014-12-28 ENCOUNTER — Ambulatory Visit (INDEPENDENT_AMBULATORY_CARE_PROVIDER_SITE_OTHER): Payer: MEDICARE

## 2014-12-28 VITALS — BP 154/87 | HR 72 | Temp 97.0°F | Ht 72.0 in | Wt 184.0 lb

## 2014-12-28 DIAGNOSIS — M7989 Other specified soft tissue disorders: Secondary | ICD-10-CM | POA: Diagnosis not present

## 2014-12-28 DIAGNOSIS — IMO0001 Reserved for inherently not codable concepts without codable children: Secondary | ICD-10-CM

## 2014-12-28 DIAGNOSIS — R030 Elevated blood-pressure reading, without diagnosis of hypertension: Secondary | ICD-10-CM

## 2014-12-28 DIAGNOSIS — S0990XA Unspecified injury of head, initial encounter: Secondary | ICD-10-CM

## 2014-12-28 DIAGNOSIS — I25810 Atherosclerosis of coronary artery bypass graft(s) without angina pectoris: Secondary | ICD-10-CM

## 2014-12-28 DIAGNOSIS — M79642 Pain in left hand: Secondary | ICD-10-CM

## 2014-12-28 DIAGNOSIS — S60222D Contusion of left hand, subsequent encounter: Secondary | ICD-10-CM

## 2014-12-28 DIAGNOSIS — Z23 Encounter for immunization: Secondary | ICD-10-CM

## 2014-12-28 NOTE — Progress Notes (Signed)
Subjective:    Patient ID: Jon Reid, male    DOB: [birth year], 81 y.o.   MRN: [id number]  HPI Patient here today for follow up from fall that happened in [place]. He has left hand pain and swelling. According to the patient's wife his son witnessed him tripping over something that was at a table where they were located. He fell and hit his hand on asphalt and subsequently ended up in the emergency room and had a CT scan of his head which he says was normal. He does not recall injuring his hand. Since that time he has felt fine. He still has the swelling in his hand.    Patient Active Problem List   Diagnosis Date Noted  . Hernia of abdominal wall 10/12/2014  . Gynecomastia 04/09/2013  . Gynecomastia, male 04/09/2013  . Coronary artery disease 02/02/2013  . 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 Myelodysplastic syndrome 11/06/2012  . Accelerating angina ([place]) 11/06/2012  . Vitamin B 12 deficiency 07/07/2012  . Hyperlipemia 07/07/2012  . Macrocytosis without anemia 01/08/2012  . PVD 04/14/2009  . CARCINOMA, PROSTATE 04/13/2009  . VASCULAR PURPURA 04/13/2009  . Benign essential HTN 04/13/2009  . RENAL INSUFFICIENCY 04/13/2009  . SLEEP APNEA 04/13/2009  . ELECTROCARDIOGRAM, ABNORMAL 04/13/2009   Outpatient Encounter Prescriptions as of 12/28/2014  Medication Sig  . aspirin EC 325 MG EC tablet Take 1 tablet (325 mg total) by mouth daily.  . cholecalciferol (VITAMIN D) 1000 UNITS tablet Take 2,000 Units by mouth daily.  . fluticasone (FLONASE) 50 MCG/ACT nasal spray One to 2 sprays each nostril at bedtime  . furosemide (LASIX) 40 MG tablet Take 1 tablet (40 mg total) by mouth daily. Take one three times a week  . metoprolol succinate (TOPROL-XL) 25 MG 24 hr tablet TAKE 1 TABLET ONCE A DAY  . nitroGLYCERIN (NITROSTAT) 0.4 MG SL tablet Place 0.4 mg under the tongue every 5 (five) minutes as needed for chest pain.  Marland Kitchen triamcinolone cream (KENALOG) 0.1 % Apply 1 application topically 2 (two) times  daily.   Facility-Administered Encounter Medications as of 12/28/2014  Medication  . cyanocobalamin ((VITAMIN B-12)) injection 1,000 mcg     Review of Systems  Constitutional: Negative.   HENT: Negative.   Eyes: Negative.   Respiratory: Negative.   Cardiovascular: Negative.   Gastrointestinal: Negative.   Endocrine: Negative.   Genitourinary: Negative.   Musculoskeletal: Positive for arthralgias (left hand pain and swelling).  Skin: Negative.   Allergic/Immunologic: Negative.   Neurological: Negative.   Hematological: Negative.   Psychiatric/Behavioral: Negative.        Objective:   Physical Exam  Constitutional: He is oriented to person, place, and time. He appears well-developed and well-nourished. No distress.  HENT:  Head: Normocephalic and atraumatic.  Right Ear: External ear normal.  Left Ear: External ear normal.  Nose: Nose normal.  Mouth/Throat: Oropharynx is clear and moist. No oropharyngeal exudate.  Eyes: Conjunctivae and EOM are normal. Pupils are equal, round, and reactive to light. Right eye exhibits no discharge. Left eye exhibits no discharge. No scleral icterus.  The pupils were equal round and reactive to light and accommodation and the disc appeared normal bilaterally. His EOMs were intact.  Neck: Normal range of motion. Neck supple. No thyromegaly present.  Cardiovascular: Normal rate, regular rhythm, normal heart sounds and intact distal pulses.   No murmur heard. Radial pulses were good bilaterally At 60/m  Pulmonary/Chest: Effort normal and breath sounds normal. He has no wheezes.  He has no rales.  Abdominal: He exhibits no mass.  Musculoskeletal: Normal range of motion. He exhibits edema and tenderness.  The patient has good range of motion of left wrist and there is mostly just bruising in the distal fingers. There is swelling in the dorsal hand with minimal tenderness over the wrist. There is no rubor or erythema.  Lymphadenopathy:    He has no  cervical adenopathy.  Neurological: He is alert and oriented to person, place, and time. He has normal reflexes. No cranial nerve deficit.  Skin: Skin is warm and dry. No rash noted.  Psychiatric: He has a normal mood and affect. His behavior is normal. Judgment and thought content normal.  Nursing note and vitals reviewed.   BP 185/79 mmHg  Pulse 72  Temp(Src) 97 F (36.1 C) (Oral)  Ht 6' (1.829 m)  Wt 184 lb (83.462 kg)  BMI 24.95 kg/m2       Assessment & Plan:  1. Left hand pain -The x-ray initially appears normal. We will get an official reading from the radiologist. - DG Hand Complete Left; Future  2. Swelling of left hand -This is most likely a contusion with bruising and swelling secondary to the bruising - DG Hand Complete Left; Future  3. Contusion of left hand, subsequent encounter -Elevate the hand and a sling off and on during the day  4. Elevated blood pressure -Return to clinic in 5-7 days and recheck blood pressure and hopefully at that time we'll have records of the CT scan and any other x-rays that were made.  5. Head injury, initial encounter -Return to clinic in 5-7 days -No climbing  Patient Instructions  Keep left hand elevated - so it will not swell as bad We will try and get the records for [place] We will call you when the xray report come back    Arrie Senate MD

## 2014-12-28 NOTE — Patient Instructions (Addendum)
Keep left hand elevated - so it will not swell as bad We will try and get the records for [place] We will call you when the xray report come back

## 2014-12-30 DIAGNOSIS — M79642 Pain in left hand: Secondary | ICD-10-CM | POA: Diagnosis not present

## 2015-01-04 ENCOUNTER — Ambulatory Visit (INDEPENDENT_AMBULATORY_CARE_PROVIDER_SITE_OTHER): Payer: MEDICARE | Admitting: Family Medicine

## 2015-01-04 ENCOUNTER — Encounter: Payer: Self-pay | Admitting: Family Medicine

## 2015-01-04 VITALS — BP 124/60 | HR 76 | Temp 97.2°F | Ht 72.0 in | Wt 180.0 lb

## 2015-01-04 DIAGNOSIS — M7989 Other specified soft tissue disorders: Secondary | ICD-10-CM | POA: Diagnosis not present

## 2015-01-04 DIAGNOSIS — S60222D Contusion of left hand, subsequent encounter: Secondary | ICD-10-CM | POA: Diagnosis not present

## 2015-01-04 DIAGNOSIS — S0990XD Unspecified injury of head, subsequent encounter: Secondary | ICD-10-CM

## 2015-01-04 DIAGNOSIS — I10 Essential (primary) hypertension: Secondary | ICD-10-CM

## 2015-01-04 DIAGNOSIS — I25810 Atherosclerosis of coronary artery bypass graft(s) without angina pectoris: Secondary | ICD-10-CM | POA: Diagnosis not present

## 2015-01-04 NOTE — Progress Notes (Signed)
Subjective:    Patient ID: Jon Reid, male    DOB: 08-17-1928, 81 y.o.   MRN: [id number]  HPI  Patient her today for follow up on left hand pain and swelling. His Bp also was elevated at the last visit. The patient was at a car show and fell and injured his head and this was up in [place]. He had a CT scan in the emergency room there and everything was fine. When he came into the office his hand was swollen and an x-ray was done and it indicated that he had a mild subluxation in the first metacarpal carpal joint of the left hand. The orthopedist look at this and saw him but did not provide any further treatment and he felt like this was not causing the swelling in the hand. Anyway the patient is back for recheck today of the swelling and we will review the x-ray findings again with him from [facility]. There are also degenerative changes and osteophyte formation. The CT scan was reviewed with the patient. The patient denies headache blurred vision stiffnecked nausea vomiting. He has had no sequelae finding following the fall that he recently sustained. The hand is improved.        Patient Active Problem List   Diagnosis Date Noted  . Hernia of abdominal wall 10/12/2014  . Gynecomastia 04/09/2013  . Gynecomastia, male 04/09/2013  . Coronary artery disease 02/02/2013  . 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 Myelodysplastic syndrome 11/06/2012  . Accelerating angina ([place]) 11/06/2012  . Vitamin B 12 deficiency 07/07/2012  . Hyperlipemia 07/07/2012  . Macrocytosis without anemia 01/08/2012  . PVD 04/14/2009  . CARCINOMA, PROSTATE 04/13/2009  . VASCULAR PURPURA 04/13/2009  . Benign essential HTN 04/13/2009  . RENAL INSUFFICIENCY 04/13/2009  . SLEEP APNEA 04/13/2009  . ELECTROCARDIOGRAM, ABNORMAL 04/13/2009   Outpatient Encounter Prescriptions as of 01/04/2015  Medication Sig  . aspirin EC 325 MG EC tablet Take 1 tablet (325 mg total) by mouth daily.  . cholecalciferol (VITAMIN D) 1000  UNITS tablet Take 2,000 Units by mouth daily.  . fluticasone (FLONASE) 50 MCG/ACT nasal spray One to 2 sprays each nostril at bedtime  . furosemide (LASIX) 40 MG tablet Take 1 tablet (40 mg total) by mouth daily. Take one three times a week  . metoprolol succinate (TOPROL-XL) 25 MG 24 hr tablet TAKE 1 TABLET ONCE A DAY  . nitroGLYCERIN (NITROSTAT) 0.4 MG SL tablet Place 0.4 mg under the tongue every 5 (five) minutes as needed for chest pain.  Marland Kitchen triamcinolone cream (KENALOG) 0.1 % Apply 1 application topically 2 (two) times daily.   Facility-Administered Encounter Medications as of 01/04/2015  Medication  . cyanocobalamin ((VITAMIN B-12)) injection 1,000 mcg     Review of Systems  Constitutional: Negative.   HENT: Negative.   Eyes: Negative.   Respiratory: Negative.   Cardiovascular: Negative.   Gastrointestinal: Negative.   Endocrine: Negative.   Genitourinary: Negative.   Musculoskeletal: Positive for arthralgias (left hand).  Skin: Negative.   Allergic/Immunologic: Negative.   Neurological: Negative.   Hematological: Negative.   Psychiatric/Behavioral: Negative.        Objective:   Physical Exam  Constitutional: He is oriented to person, place, and time. He appears well-developed and well-nourished. No distress.  HENT:  Head: Normocephalic and atraumatic.  Mouth/Throat: No oropharyngeal exudate.  Eyes: Conjunctivae and EOM are normal. Pupils are equal, round, and reactive to light. Right eye exhibits no discharge. Left eye exhibits no discharge. No  scleral icterus.  Neck: Normal range of motion. Neck supple.  Cardiovascular: Normal rate, regular rhythm and normal heart sounds.   No murmur heard. At 72/m  Pulmonary/Chest: Effort normal and breath sounds normal. No respiratory distress. He has no wheezes. He has no rales. He exhibits no tenderness.  Musculoskeletal: Normal range of motion. He exhibits edema. He exhibits no tenderness.  There is still some slight swelling in  the left dorsal hand and fingers. It is improved. He has better mobility.  Neurological: He is alert and oriented to person, place, and time.  Skin: Skin is warm and dry.  Psychiatric: He has a normal mood and affect. His behavior is normal. Judgment and thought content normal.  Nursing note and vitals reviewed.  BP 141/64 mmHg  Pulse 76  Temp(Src) 97.2 F (36.2 C) (Oral)  Ht 6' (1.829 m)  Wt 180 lb (81.647 kg)  BMI 24.41 kg/m2        Assessment & Plan:  1. Benign essential HTN -Repeat blood pressure today was improved at 124/60 with the patient sitting and this was checked in the right arm manually.  2. Swelling of left hand -The contusion of his left hand has also improved there is still slight swelling and slight limited range of motion due to the swelling but this is also improved. There is no rubor or redness and recent x-rays were negative for any acute fracture.  3. Contusion of left hand, subsequent encounter -As above  4. Head injury, subsequent encounter -The CT scan was reviewed from [facility]. There was no significant abnormalities on this related to his fall. The patient has had no symptoms of stiff neck blurred vision or severe headache.  Patient Instructions  Elevate left arm as much as possible  Continue to check blood pressures at home and watch sodium intake   Arrie Senate MD

## 2015-01-04 NOTE — Patient Instructions (Addendum)
Elevate left arm as much as possible  Continue to check blood pressures at home and watch sodium intake

## 2015-01-17 ENCOUNTER — Other Ambulatory Visit: Payer: Self-pay | Admitting: Cardiology

## 2015-01-21 ENCOUNTER — Ambulatory Visit (INDEPENDENT_AMBULATORY_CARE_PROVIDER_SITE_OTHER): Payer: MEDICARE | Admitting: *Deleted

## 2015-01-21 DIAGNOSIS — E538 Deficiency of other specified B group vitamins: Secondary | ICD-10-CM | POA: Diagnosis not present

## 2015-01-21 NOTE — Progress Notes (Signed)
Pt given B12 injection IM right deltoid and tolerated well. 

## 2015-01-21 NOTE — Patient Instructions (Signed)

## 2015-02-17 ENCOUNTER — Other Ambulatory Visit: Payer: Self-pay | Admitting: Family Medicine

## 2015-02-21 ENCOUNTER — Encounter: Payer: Self-pay | Admitting: Family Medicine

## 2015-02-21 ENCOUNTER — Ambulatory Visit (INDEPENDENT_AMBULATORY_CARE_PROVIDER_SITE_OTHER): Payer: MEDICARE | Admitting: Family Medicine

## 2015-02-21 VITALS — BP 148/70 | HR 63 | Temp 97.2°F | Ht 72.0 in | Wt 183.0 lb

## 2015-02-21 DIAGNOSIS — E538 Deficiency of other specified B group vitamins: Secondary | ICD-10-CM | POA: Diagnosis not present

## 2015-02-21 DIAGNOSIS — I25810 Atherosclerosis of coronary artery bypass graft(s) without angina pectoris: Secondary | ICD-10-CM | POA: Diagnosis not present

## 2015-02-21 DIAGNOSIS — E785 Hyperlipidemia, unspecified: Secondary | ICD-10-CM

## 2015-02-21 DIAGNOSIS — R29898 Other symptoms and signs involving the musculoskeletal system: Secondary | ICD-10-CM

## 2015-02-21 DIAGNOSIS — E559 Vitamin D deficiency, unspecified: Secondary | ICD-10-CM | POA: Diagnosis not present

## 2015-02-21 DIAGNOSIS — I10 Essential (primary) hypertension: Secondary | ICD-10-CM | POA: Diagnosis not present

## 2015-02-21 NOTE — Progress Notes (Signed)
Subjective:    Patient ID: Jon Reid, male    DOB: 1928/03/26, 81 y.o.   MRN: [id number]  HPI Pt here for follow up and management of chronic medical problems which includes hypertension. He is taking medications regularly. The patient's main complaint today is fatigue. He has a history of a CABG, coronary artery disease, myelodysplastic syndrome hyperlipidemia hypertension and B12 deficiency. The patient complains of weakness in his legs with walking. He denies chest pain shortness of breath trouble swallowing heartburn indigestion nausea vomiting diarrhea or blood in the stool. He continues to have some discomfort in his left wrist secondary to the fall from several months ago. We will continue to monitor this. He's passing his water without problems and has no incontinence.      Patient Active Problem List   Diagnosis Date Noted  . Hernia of abdominal wall 10/12/2014  . Gynecomastia 04/09/2013  . Gynecomastia, male 04/09/2013  . Coronary artery disease 02/02/2013  . 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 Myelodysplastic syndrome 11/06/2012  . Accelerating angina ([place]) 11/06/2012  . Vitamin B 12 deficiency 07/07/2012  . Hyperlipemia 07/07/2012  . Macrocytosis without anemia 01/08/2012  . PVD 04/14/2009  . CARCINOMA, PROSTATE 04/13/2009  . VASCULAR PURPURA 04/13/2009  . Benign essential HTN 04/13/2009  . RENAL INSUFFICIENCY 04/13/2009  . SLEEP APNEA 04/13/2009  . ELECTROCARDIOGRAM, ABNORMAL 04/13/2009   Outpatient Encounter Prescriptions as of 02/21/2015  Medication Sig  . aspirin EC 325 MG EC tablet Take 1 tablet (325 mg total) by mouth daily.  . cholecalciferol (VITAMIN D) 1000 UNITS tablet Take 2,000 Units by mouth daily.  . fluticasone (FLONASE) 50 MCG/ACT nasal spray One to 2 sprays each nostril at bedtime  . furosemide (LASIX) 40 MG tablet Take 1 tablet (40 mg total) by mouth daily.  . metoprolol succinate (TOPROL-XL) 25 MG 24 hr tablet TAKE 1 TABLET ONCE A DAY  . nitroGLYCERIN  (NITROSTAT) 0.4 MG SL tablet Place 0.4 mg under the tongue every 5 (five) minutes as needed for chest pain.  Marland Kitchen triamcinolone cream (KENALOG) 0.1 % Apply 1 application topically 2 (two) times daily.   Facility-Administered Encounter Medications as of 02/21/2015  Medication  . cyanocobalamin ((VITAMIN B-12)) injection 1,000 mcg      Review of Systems  Constitutional: Positive for fatigue.  HENT: Negative.   Eyes: Negative.   Respiratory: Negative.   Cardiovascular: Negative.   Gastrointestinal: Negative.   Endocrine: Negative.   Genitourinary: Negative.   Musculoskeletal: Negative.   Skin: Negative.   Allergic/Immunologic: Negative.   Neurological: Negative.   Hematological: Negative.   Psychiatric/Behavioral: Negative.        Objective:   Physical Exam  Constitutional: He is oriented to person, place, and time. He appears well-developed and well-nourished. No distress.  HENT:  Head: Normocephalic and atraumatic.  Right Ear: External ear normal.  Left Ear: External ear normal.  Nose: Nose normal.  Mouth/Throat: Oropharynx is clear and moist. No oropharyngeal exudate.  Eyes: Conjunctivae and EOM are normal. Pupils are equal, round, and reactive to light. Right eye exhibits no discharge. Left eye exhibits no discharge. No scleral icterus.  The patient gets his eyes examined every 2 years and that will be due this coming summer from Dr. Katy Fitch  Neck: Normal range of motion. Neck supple. No thyromegaly present.  Cardiovascular: Normal rate, regular rhythm, normal heart sounds and intact distal pulses.   No murmur heard. At 60/m with a regular rate and rhythm  Pulmonary/Chest: Effort normal and  breath sounds normal. No respiratory distress. He has no wheezes. He has no rales. He exhibits no tenderness.  Clear anteriorly and posteriorly  Abdominal: Soft. Bowel sounds are normal. He exhibits no mass. There is no tenderness. There is no rebound and no guarding.  No abdominal bruits    Musculoskeletal: Normal range of motion. He exhibits no edema or tenderness.  Lymphadenopathy:    He has no cervical adenopathy.  Neurological: He is alert and oriented to person, place, and time. He has normal reflexes. No cranial nerve deficit.  Skin: Skin is warm and dry. No rash noted.  Psychiatric: He has a normal mood and affect. His behavior is normal. Judgment and thought content normal.  Nursing note and vitals reviewed.   BP 164/74 mmHg  Pulse 63  Temp(Src) 97.2 F (36.2 C) (Oral)  Ht 6' (1.829 m)  Wt 183 lb (83.008 kg)  BMI 24.81 kg/m2       Assessment & Plan:  1. Benign essential HTN -The blood pressure is elevated today and a repeat blood pressure was 148/70 in the right arm sitting. He indicates he has not taken his medicine today. He will take this in the future was some water before his visit. He will come by 1 week for repeat blood pressure after having taken his medication. His home readings have been running in the 130s over the 60-70 range. - BMP8+EGFR - CBC with Differential/Platelet - Hepatic function panel  2. Hyperlipemia -The patient is currently not taking any statin drug because of the reaction that he had to the previous statin of Crestor. - CBC with Differential/Platelet - NMR, lipoprofile  3. Vitamin D deficiency -Continue current treatment pending results of lab work - CBC with Differential/Platelet - VITAMIN D 25 Hydroxy (Vit-D Deficiency, Fractures)  4. B12 deficiency -Continue B12 injections pending results of lab work - Vitamin B12  5. Weakness of both legs -The patient has good pulses bilaterally in both feet and this does not appear to be a reason for his leg weakness with walking.  Patient Instructions                       Medicare Annual Wellness Visit  [facility] and the medical providers at [facility] strive to bring you the best medical care.  In doing so we not only want to address your current  medical conditions and concerns but also to detect new conditions early and prevent illness, disease and health-related problems.    Medicare offers a yearly Wellness Visit which allows our clinical staff to assess your need for preventative services including immunizations, lifestyle education, counseling to decrease risk of preventable diseases and screening for fall risk and other medical concerns.    This visit is provided free of charge (no copay) for all Medicare recipients. The clinical pharmacists at [facility] have begun to conduct these Wellness Visits which will also include a thorough review of all your medications.    As you primary medical provider recommend that you make an appointment for your Annual Wellness Visit if you have not done so already this year.  You may set up this appointment before you leave today or you may call back [phone]) and schedule an appointment.  Please make sure when you call that you mention that you are scheduling your Annual Wellness Visit with the clinical pharmacist so that the appointment may be made for the proper length of time.  Continue current medications. Continue good therapeutic lifestyle changes which include good diet and exercise. Fall precautions discussed with patient. If an FOBT was given today- please return it to our front desk. If you are over 79 years old - you may need Prevnar 3 or the adult Pneumonia vaccine.  **Flu shots are available--- please call and schedule a FLU-CLINIC appointment**  After your visit with [name] today you will receive a survey in the mail or online from [employer] regarding your care with [name]. Please take a moment to fill this out. Your feedback is very important to [name] as you can help [name] better understand your patient needs as well as improve your experience and satisfaction. WE CARE ABOUT YOU!!!   The patient has not taken his blood pressure medicine today and he will come back by  the office in 7-10 days and have his blood pressure rechecked by the nurse after having taken his blood pressure medication He will continue to monitor the blood pressure at home He should continue to follow-up with cardiology He should continue to drink plenty of fluids and stay well hydrated this winter   Arrie Senate MD

## 2015-02-21 NOTE — Patient Instructions (Addendum)
Medicare Annual Wellness Visit  [facility] and the medical providers at [facility] strive to bring you the best medical care.  In doing so we not only want to address your current medical conditions and concerns but also to detect new conditions early and prevent illness, disease and health-related problems.    Medicare offers a yearly Wellness Visit which allows our clinical staff to assess your need for preventative services including immunizations, lifestyle education, counseling to decrease risk of preventable diseases and screening for fall risk and other medical concerns.    This visit is provided free of charge (no copay) for all Medicare recipients. The clinical pharmacists at [facility] have begun to conduct these Wellness Visits which will also include a thorough review of all your medications.    As you primary medical provider recommend that you make an appointment for your Annual Wellness Visit if you have not done so already this year.  You may set up this appointment before you leave today or you may call back [phone] and schedule an appointment.  Please make sure when you call that you mention that you are scheduling your Annual Wellness Visit with the clinical pharmacist so that the appointment may be made for the proper length of time.     Continue current medications. Continue good therapeutic lifestyle changes which include good diet and exercise. Fall precautions discussed with patient. If an FOBT was given today- please return it to our front desk. If you are over 30 years old - you may need Prevnar 35 or the adult Pneumonia vaccine.  **Flu shots are available--- please call and schedule a FLU-CLINIC appointment**  After your visit with [name] today you will receive a survey in the mail or online from [employer] regarding your care with [name]. Please take a moment to fill this out. Your feedback is very  important to [name] as you can help [name] better understand your patient needs as well as improve your experience and satisfaction. WE CARE ABOUT YOU!!!   The patient has not taken his blood pressure medicine today and he will come back by the office in 7-10 days and have his blood pressure rechecked by the nurse after having taken his blood pressure medication He will continue to monitor the blood pressure at home He should continue to follow-up with cardiology He should continue to drink plenty of fluids and stay well hydrated this winter

## 2015-02-23 LAB — CBC WITH DIFFERENTIAL/PLATELET
BASOS: 1 %
Basophils Absolute: 0 10*3/uL (ref 0.0–0.2)
EOS (ABSOLUTE): 0.1 10*3/uL (ref 0.0–0.4)
EOS: 2 %
HEMATOCRIT: 35.4 % — AB (ref 37.5–51.0)
HEMOGLOBIN: 12.3 g/dL — AB (ref 12.6–17.7)
Immature Grans (Abs): 0 10*3/uL (ref 0.0–0.1)
Immature Granulocytes: 0 %
LYMPHS ABS: 2.1 10*3/uL (ref 0.7–3.1)
Lymphs: 34 %
MCH: 34.1 pg — AB (ref 26.6–33.0)
MCHC: 34.7 g/dL (ref 31.5–35.7)
MCV: 98 fL — ABNORMAL HIGH (ref 79–97)
MONOS ABS: 0.8 10*3/uL (ref 0.1–0.9)
Monocytes: 12 %
NEUTROS ABS: 3.1 10*3/uL (ref 1.4–7.0)
Neutrophils: 51 %
Platelets: 273 10*3/uL (ref 150–379)
RBC: 3.61 x10E6/uL — AB (ref 4.14–5.80)
RDW: 12.1 % — AB (ref 12.3–15.4)
WBC: 6.1 10*3/uL (ref 3.4–10.8)

## 2015-02-23 LAB — BMP8+EGFR
BUN / CREAT RATIO: 18 (ref 10–22)
BUN: 25 mg/dL (ref 8–27)
CO2: 24 mmol/L (ref 18–29)
CREATININE: 1.41 mg/dL — AB (ref 0.76–1.27)
Calcium: 9.4 mg/dL (ref 8.6–10.2)
Chloride: 100 mmol/L (ref 97–106)
GFR calc non Af Amer: 45 mL/min/{1.73_m2} — ABNORMAL LOW (ref >59)
GFR, EST AFRICAN AMERICAN: 53 mL/min/{1.73_m2} — AB (ref >59)
GLUCOSE: 97 mg/dL (ref 65–99)
Potassium: 4.1 mmol/L (ref 3.5–5.2)
SODIUM: 140 mmol/L (ref 136–144)

## 2015-02-23 LAB — NMR, LIPOPROFILE
Cholesterol: 179 mg/dL (ref 100–199)
HDL CHOLESTEROL BY NMR: 64 mg/dL (ref >39)
HDL Particle Number: 33.4 umol/L (ref >=30.5)
LDL Particle Number: 1007 nmol/L — ABNORMAL HIGH (ref <1000)
LDL Size: 21 nm (ref >20.5)
LDL-C: 102 mg/dL — AB (ref 0–99)
Small LDL Particle Number: 176 nmol/L (ref <=527)
Triglycerides by NMR: 66 mg/dL (ref 0–149)

## 2015-02-23 LAB — HEPATIC FUNCTION PANEL
ALK PHOS: 68 IU/L (ref 39–117)
ALT: 12 IU/L (ref 0–44)
AST: 25 IU/L (ref 0–40)
Albumin: 4.7 g/dL (ref 3.5–4.7)
Bilirubin Total: 0.5 mg/dL (ref 0.0–1.2)
Bilirubin, Direct: 0.15 mg/dL (ref 0.00–0.40)
Total Protein: 6.7 g/dL (ref 6.0–8.5)

## 2015-02-23 LAB — VITAMIN D 25 HYDROXY (VIT D DEFICIENCY, FRACTURES): VIT D 25 HYDROXY: 41.1 ng/mL (ref 30.0–100.0)

## 2015-02-23 LAB — VITAMIN B12

## 2015-03-25 ENCOUNTER — Ambulatory Visit (INDEPENDENT_AMBULATORY_CARE_PROVIDER_SITE_OTHER): Payer: MEDICARE

## 2015-03-25 DIAGNOSIS — E538 Deficiency of other specified B group vitamins: Secondary | ICD-10-CM | POA: Diagnosis not present

## 2015-04-12 DIAGNOSIS — H353131 Nonexudative age-related macular degeneration, bilateral, early dry stage: Secondary | ICD-10-CM | POA: Diagnosis not present

## 2015-04-12 DIAGNOSIS — H04123 Dry eye syndrome of bilateral lacrimal glands: Secondary | ICD-10-CM | POA: Diagnosis not present

## 2015-04-12 DIAGNOSIS — Z961 Presence of intraocular lens: Secondary | ICD-10-CM | POA: Diagnosis not present

## 2015-04-18 IMAGING — CR DG CHEST 2V
2 series · 2 of 2 positions shown · non-contrast
Comparison: 11/16/2012

CLINICAL DATA: Coronary artery disease, essential hypertension,
prostate cancer

EXAM:
CHEST  2 VIEW

[w chest pa]
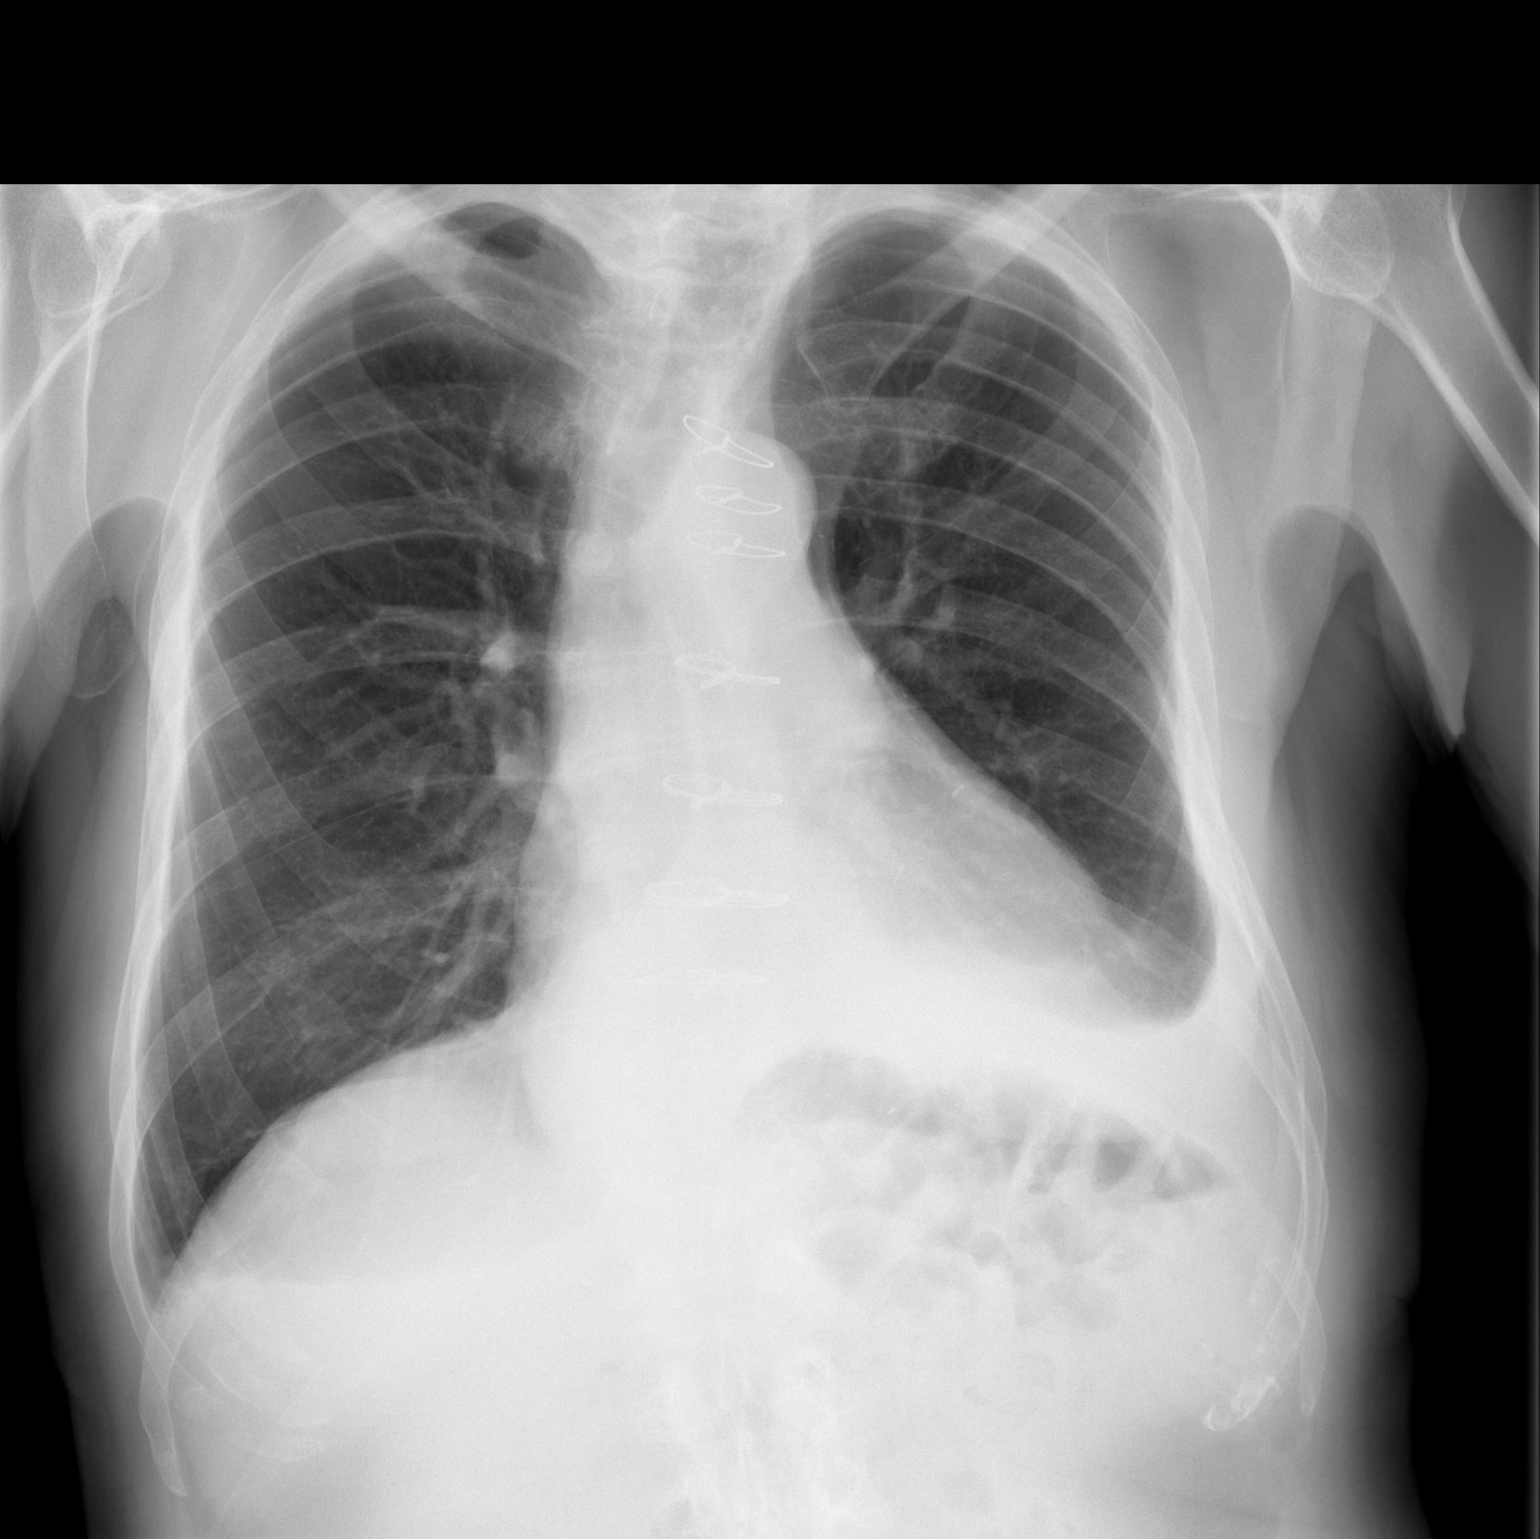

[w chest lat]
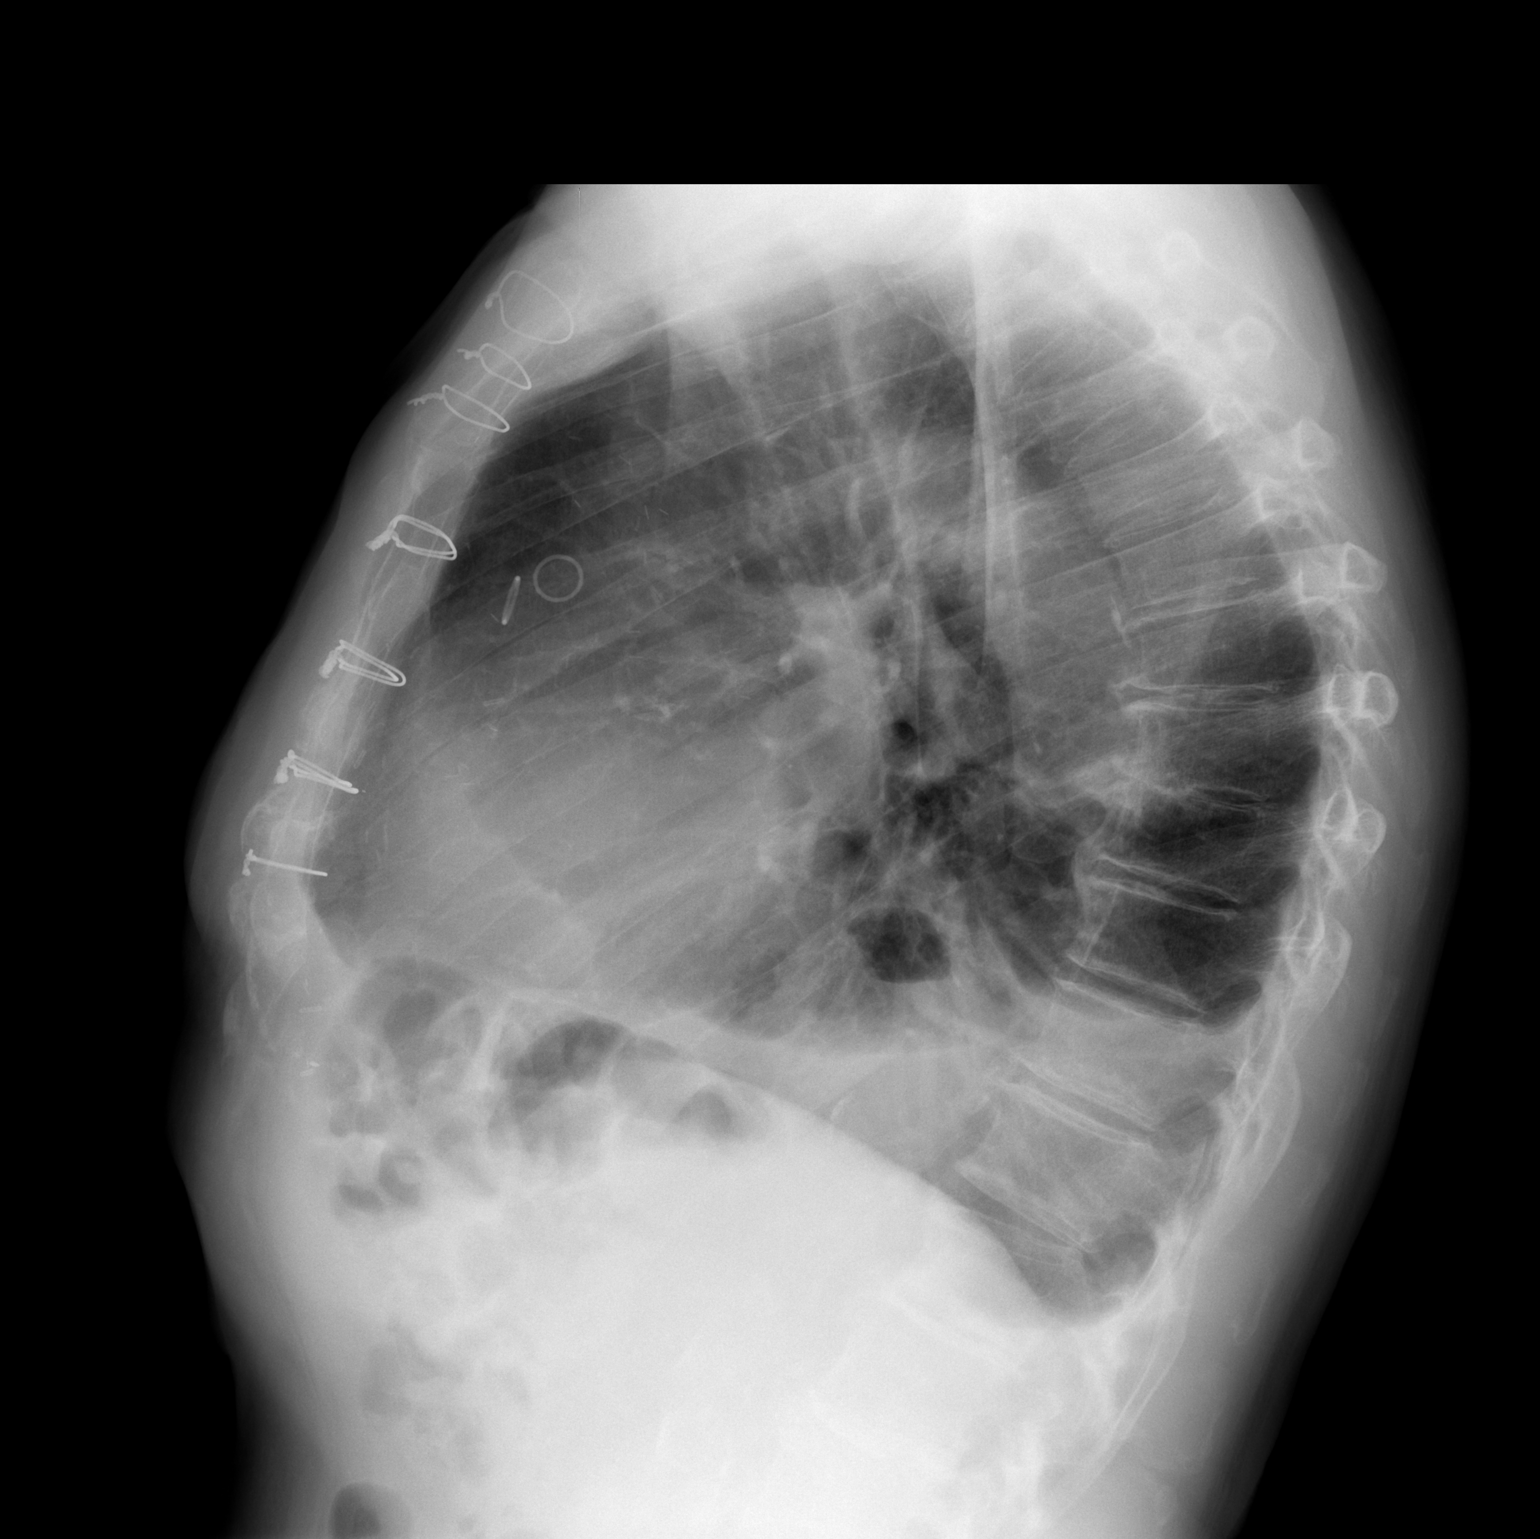

[2 of 2 positions shown; findings below may reference images not displayed]

FINDINGS: Enlargement of cardiac silhouette post CABG.

Mediastinal contours and pulmonary vascularity normal.

COPD changes with persistent left pleural effusion and basilar
atelectasis.

Tiny right pleural effusion.

Lungs otherwise clear.

Small hiatal hernia identified.

No pneumothorax or acute infiltrate.

Endplate spur formation thoracic spine with broad-based dextro
convex thoracic scoliosis.
IMPRESSION: COPD changes with bibasilar effusions greater on left with
associated mild left basilar atelectasis.

## 2015-04-27 ENCOUNTER — Ambulatory Visit (INDEPENDENT_AMBULATORY_CARE_PROVIDER_SITE_OTHER): Payer: MEDICARE | Admitting: *Deleted

## 2015-04-27 DIAGNOSIS — E538 Deficiency of other specified B group vitamins: Secondary | ICD-10-CM

## 2015-04-27 NOTE — Progress Notes (Signed)
Vitamin b12 injection given and tolerated well.  

## 2015-04-27 NOTE — Patient Instructions (Signed)

## 2015-05-26 ENCOUNTER — Ambulatory Visit (INDEPENDENT_AMBULATORY_CARE_PROVIDER_SITE_OTHER): Payer: MEDICARE | Admitting: *Deleted

## 2015-05-26 DIAGNOSIS — E538 Deficiency of other specified B group vitamins: Secondary | ICD-10-CM

## 2015-05-26 NOTE — Patient Instructions (Signed)

## 2015-05-26 NOTE — Progress Notes (Signed)
Pt given B12 injection IM right deltoid and tolerated well. 

## 2015-07-07 ENCOUNTER — Ambulatory Visit (INDEPENDENT_AMBULATORY_CARE_PROVIDER_SITE_OTHER): Payer: MEDICARE | Admitting: Family Medicine

## 2015-07-07 ENCOUNTER — Ambulatory Visit (INDEPENDENT_AMBULATORY_CARE_PROVIDER_SITE_OTHER): Payer: MEDICARE

## 2015-07-07 ENCOUNTER — Encounter: Payer: Self-pay | Admitting: Family Medicine

## 2015-07-07 VITALS — BP 148/70 | HR 59 | Temp 98.7°F | Ht 72.0 in | Wt 184.0 lb

## 2015-07-07 DIAGNOSIS — E538 Deficiency of other specified B group vitamins: Secondary | ICD-10-CM | POA: Diagnosis not present

## 2015-07-07 DIAGNOSIS — D469 Myelodysplastic syndrome, unspecified: Secondary | ICD-10-CM

## 2015-07-07 DIAGNOSIS — Z1211 Encounter for screening for malignant neoplasm of colon: Secondary | ICD-10-CM | POA: Diagnosis not present

## 2015-07-07 DIAGNOSIS — I10 Essential (primary) hypertension: Secondary | ICD-10-CM

## 2015-07-07 DIAGNOSIS — R0602 Shortness of breath: Secondary | ICD-10-CM | POA: Diagnosis not present

## 2015-07-07 DIAGNOSIS — C61 Malignant neoplasm of prostate: Secondary | ICD-10-CM

## 2015-07-07 DIAGNOSIS — R12 Heartburn: Secondary | ICD-10-CM | POA: Diagnosis not present

## 2015-07-07 DIAGNOSIS — E559 Vitamin D deficiency, unspecified: Secondary | ICD-10-CM

## 2015-07-07 DIAGNOSIS — E785 Hyperlipidemia, unspecified: Secondary | ICD-10-CM | POA: Diagnosis not present

## 2015-07-07 DIAGNOSIS — R531 Weakness: Secondary | ICD-10-CM | POA: Diagnosis not present

## 2015-07-07 NOTE — Progress Notes (Signed)
Subjective:    Patient ID: Jon Reid, male    DOB: 06-Apr-1928, 82 y.o.   MRN: [id number]  HPI Pt here for follow up and management of chronic medical problems which includes hypertension. He is taking medications regularly.The patient today does complain of some weakness and shortness of breath with exertion and walking. He is due to return an FOBT and is due to get a chest x-ray today. He will get lab work today. He comes to the visit today without taking his medicines and his blood pressure is elevated at 178/73. He will also get his B12 injection today. The patient did not take his blood pressure medicine this morning. A repeat blood pressure by me manually in the right arm with a large cuff was 148/70. He says the blood pressures at home usually run 120-140 over the 60 range. The patient denies any chest pain but does complain of tightness with walking and with climbing steps and this is been worse over the past 2-3 weeks. He denies any problems with nausea or vomiting but does have some increased heartburn. He is passing his water without problems and has an upcoming appointment with the urologist.     Patient Active Problem List   Diagnosis Date Noted  . Hernia of abdominal wall 10/12/2014  . Gynecomastia 04/09/2013  . Gynecomastia, male 04/09/2013  . Coronary artery disease 02/02/2013  . 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 Myelodysplastic syndrome 11/06/2012  . Accelerating angina ([place]) 11/06/2012  . Vitamin B 12 deficiency 07/07/2012  . Hyperlipemia 07/07/2012  . Macrocytosis without anemia 01/08/2012  . PVD 04/14/2009  . CARCINOMA, PROSTATE 04/13/2009  . VASCULAR PURPURA 04/13/2009  . Benign essential HTN 04/13/2009  . RENAL INSUFFICIENCY 04/13/2009  . SLEEP APNEA 04/13/2009  . ELECTROCARDIOGRAM, ABNORMAL 04/13/2009   Outpatient Encounter Prescriptions as of 07/07/2015  Medication Sig  . aspirin EC 325 MG EC tablet Take 1 tablet (325 mg total) by mouth daily.  . cholecalciferol  (VITAMIN D) 1000 UNITS tablet Take 2,000 Units by mouth daily.  . fluticasone (FLONASE) 50 MCG/ACT nasal spray One to 2 sprays each nostril at bedtime  . furosemide (LASIX) 40 MG tablet Take 1 tablet (40 mg total) by mouth daily.  . metoprolol succinate (TOPROL-XL) 25 MG 24 hr tablet TAKE 1 TABLET ONCE A DAY  . nitroGLYCERIN (NITROSTAT) 0.4 MG SL tablet Place 0.4 mg under the tongue every 5 (five) minutes as needed for chest pain.  Marland Kitchen triamcinolone cream (KENALOG) 0.1 % Apply 1 application topically 2 (two) times daily.   Facility-Administered Encounter Medications as of 07/07/2015  Medication  . cyanocobalamin ((VITAMIN B-12)) injection 1,000 mcg      Review of Systems  HENT: Negative.   Eyes: Negative.   Respiratory: Positive for shortness of breath (with excertion).   Cardiovascular: Negative.   Gastrointestinal: Negative.   Endocrine: Negative.   Genitourinary: Negative.   Musculoskeletal: Negative.   Skin: Negative.   Allergic/Immunologic: Negative.   Neurological: Positive for weakness.  Hematological: Negative.   Psychiatric/Behavioral: Negative.        Objective:   Physical Exam  Constitutional: He is oriented to person, place, and time. He appears well-developed and well-nourished.  Alert and younger looking than his stated age. He will soon be 87.  HENT:  Head: Normocephalic and atraumatic.  Right Ear: External ear normal.  Left Ear: External ear normal.  Mouth/Throat: Oropharynx is clear and moist. No oropharyngeal exudate.  Nasal congestion bilaterally  Eyes: Conjunctivae and  EOM are normal. Pupils are equal, round, and reactive to light. Right eye exhibits no discharge. Left eye exhibits no discharge. No scleral icterus.  Neck: Normal range of motion. Neck supple. No thyromegaly present.  No bruits or adenopathy  Cardiovascular: Normal rate, regular rhythm and intact distal pulses.  Exam reveals no gallop and no friction rub.   Murmur heard. The heart is  regular at 60/m with a grade 2/6 systolic ejection murmur  Pulmonary/Chest: Effort normal and breath sounds normal. No respiratory distress. He has no wheezes. He has no rales. He exhibits no tenderness.  Abdominal: Soft. Bowel sounds are normal. He exhibits no mass. There is no tenderness. There is no rebound and no guarding.  Musculoskeletal: Normal range of motion. He exhibits no edema or tenderness.  Lymphadenopathy:    He has no cervical adenopathy.  Neurological: He is alert and oriented to person, place, and time.  Skin: Skin is warm and dry. No rash noted.  Psychiatric: He has a normal mood and affect. His behavior is normal. Judgment and thought content normal.  Nursing note and vitals reviewed.  BP 178/73 mmHg  Pulse 59  Temp(Src) 98.7 F (37.1 C) (Oral)  Ht 6' (1.829 m)  Wt 184 lb (83.462 kg)  BMI 24.95 kg/m2   [facility] reading (PRIMARY) by  Dr.Keshav Winegar-S x-ray with results pending                                   EKG: The EKG is slightly different from the previous EKG in that there is an incomplete right bundle branch block with a first-degree AV block and left anterior fascicular block.     Assessment & Plan:  1. Benign essential HTN -The blood pressure is elevated today and patient is not taking his medicine but he says his home readings are always much better. He will bring readings back to the next visit. Confirmation. - BMP8+EGFR - CBC with Differential/Platelet - Hepatic function panel - DG Chest 2 View; Future  2. Hyperlipemia -Continue with aggressive therapeutic lifestyle changes as tolerated - CBC with Differential/Platelet - NMR, lipoprofile  3. Vitamin D deficiency -Continue with vitamin D replacement pending results of lab work - CBC with Differential/Platelet - VITAMIN D 25 Hydroxy (Vit-D Deficiency, Fractures)  4. B12 deficiency -B12 injection today and B12 level will be done with lab work - CBC with Differential/Platelet  5. Special screening for  malignant neoplasms, colon - CBC with Differential/Platelet - Fecal occult blood, imunochemical; Future  6. Weakness -Check labs and do referrals as planned - Vitamin B12 - Thyroid Panel With TSH - EKG 12-Lead - Ambulatory referral to Pulmonology  7. SOB (shortness of breath) -Shortness of breath is primarily with exertion he also has some chest tightness with exertion - Vitamin B12 - Thyroid Panel With TSH - EKG 12-Lead - Ambulatory referral to Pulmonology  8. Myelodysplastic syndrome -Follow-up with hematology pending results of lab work  9. Vitamin B 12 deficiency -B12 injection today  10. CARCINOMA, PROSTATE -Follow-up with urology as planned  11. Heartburn -Take ranitidine 150 twice daily before breakfast and supper at least for one month and then as needed  No orders of the defined types were placed in this encounter.   Patient Instructions                       Medicare Annual Wellness Visit  [facility] and  the medical providers at [facility] strive to bring you the best medical care.  In doing so we not only want to address your current medical conditions and concerns but also to detect new conditions early and prevent illness, disease and health-related problems.    Medicare offers a yearly Wellness Visit which allows our clinical staff to assess your need for preventative services including immunizations, lifestyle education, counseling to decrease risk of preventable diseases and screening for fall risk and other medical concerns.    This visit is provided free of charge (no copay) for all Medicare recipients. The clinical pharmacists at [facility] have begun to conduct these Wellness Visits which will also include a thorough review of all your medications.    As you primary medical provider recommend that you make an appointment for your Annual Wellness Visit if you have not done so already this year.  You may set  up this appointment before you leave today or you may call back [phone]) and schedule an appointment.  Please make sure when you call that you mention that you are scheduling your Annual Wellness Visit with the clinical pharmacist so that the appointment may be made for the proper length of time.     Continue current medications. Continue good therapeutic lifestyle changes which include good diet and exercise. Fall precautions discussed with patient. If an FOBT was given today- please return it to our front desk. If you are over 13 years old - you may need Prevnar 55 or the adult Pneumonia vaccine.  **Flu shots are available--- please call and schedule a FLU-CLINIC appointment**  After your visit with [name] today you will receive a survey in the mail or online from [employer] regarding your care with [name]. Please take a moment to fill this out. Your feedback is very important to [name] as you can help [name] better understand your patient needs as well as improve your experience and satisfaction. WE CARE ABOUT YOU!!!   Because of the symptoms you're having an you are due to get a visit with the cardiologist we will go ahead and make that appointment sooner rather than on over in May and we will call you without appointment as soon as possible We will also schedule you for a visit with a pulmonologist to see if there's anything we can do from the lung side to help your symptoms you're having with walking The patient should restart his Flonase and use this regularly He should also use nasal saline frequently in each nostril during the day He should follow-up with his urologist as planned We will also review your blood work and discuss this with the hematologist if needed Please get some Zantac or ranitidine 150, the equate brand and take one twice daily for breakfast and supper for heartburn. Do this regularly for about a month and then do this as needed after that   Arrie Senate MD

## 2015-07-07 NOTE — Patient Instructions (Addendum)
Medicare Annual Wellness Visit  [facility] and the medical providers at [facility] strive to bring you the best medical care.  In doing so we not only want to address your current medical conditions and concerns but also to detect new conditions early and prevent illness, disease and health-related problems.    Medicare offers a yearly Wellness Visit which allows our clinical staff to assess your need for preventative services including immunizations, lifestyle education, counseling to decrease risk of preventable diseases and screening for fall risk and other medical concerns.    This visit is provided free of charge (no copay) for all Medicare recipients. The clinical pharmacists at [facility] have begun to conduct these Wellness Visits which will also include a thorough review of all your medications.    As you primary medical provider recommend that you make an appointment for your Annual Wellness Visit if you have not done so already this year.  You may set up this appointment before you leave today or you may call back [phone] and schedule an appointment.  Please make sure when you call that you mention that you are scheduling your Annual Wellness Visit with the clinical pharmacist so that the appointment may be made for the proper length of time.     Continue current medications. Continue good therapeutic lifestyle changes which include good diet and exercise. Fall precautions discussed with patient. If an FOBT was given today- please return it to our front desk. If you are over 49 years old - you may need Prevnar 24 or the adult Pneumonia vaccine.  **Flu shots are available--- please call and schedule a FLU-CLINIC appointment**  After your visit with [name] today you will receive a survey in the mail or online from [employer] regarding your care with [name]. Please take a moment to fill this out. Your feedback is very  important to [name] as you can help [name] better understand your patient needs as well as improve your experience and satisfaction. WE CARE ABOUT YOU!!!   Because of the symptoms you're having an you are due to get a visit with the cardiologist we will go ahead and make that appointment sooner rather than on over in May and we will call you without appointment as soon as possible We will also schedule you for a visit with a pulmonologist to see if there's anything we can do from the lung side to help your symptoms you're having with walking The patient should restart his Flonase and use this regularly He should also use nasal saline frequently in each nostril during the day He should follow-up with his urologist as planned We will also review your blood work and discuss this with the hematologist if needed Please get some Zantac or ranitidine 150, the equate brand and take one twice daily for breakfast and supper for heartburn. Do this regularly for about a month and then do this as needed after that

## 2015-07-08 LAB — CBC WITH DIFFERENTIAL/PLATELET
BASOS: 1 %
Basophils Absolute: 0 10*3/uL (ref 0.0–0.2)
EOS (ABSOLUTE): 0.1 10*3/uL (ref 0.0–0.4)
EOS: 1 %
HEMATOCRIT: 33.6 % — AB (ref 37.5–51.0)
Hemoglobin: 11.1 g/dL — ABNORMAL LOW (ref 12.6–17.7)
IMMATURE GRANS (ABS): 0 10*3/uL (ref 0.0–0.1)
IMMATURE GRANULOCYTES: 0 %
LYMPHS: 24 %
Lymphocytes Absolute: 1.9 10*3/uL (ref 0.7–3.1)
MCH: 30.9 pg (ref 26.6–33.0)
MCHC: 33 g/dL (ref 31.5–35.7)
MCV: 94 fL (ref 79–97)
MONOS ABS: 0.7 10*3/uL (ref 0.1–0.9)
Monocytes: 9 %
NEUTROS ABS: 5.1 10*3/uL (ref 1.4–7.0)
Neutrophils: 65 %
PLATELETS: 299 10*3/uL (ref 150–379)
RBC: 3.59 x10E6/uL — ABNORMAL LOW (ref 4.14–5.80)
RDW: 14.8 % (ref 12.3–15.4)
WBC: 7.7 10*3/uL (ref 3.4–10.8)

## 2015-07-08 LAB — BMP8+EGFR
BUN/Creatinine Ratio: 14 (ref 10–24)
BUN: 19 mg/dL (ref 8–27)
CO2: 19 mmol/L (ref 18–29)
CREATININE: 1.4 mg/dL — AB (ref 0.76–1.27)
Calcium: 8.9 mg/dL (ref 8.6–10.2)
Chloride: 103 mmol/L (ref 96–106)
GFR, EST AFRICAN AMERICAN: 53 mL/min/{1.73_m2} — AB (ref >59)
GFR, EST NON AFRICAN AMERICAN: 46 mL/min/{1.73_m2} — AB (ref >59)
Glucose: 101 mg/dL — ABNORMAL HIGH (ref 65–99)
POTASSIUM: 4.5 mmol/L (ref 3.5–5.2)
SODIUM: 140 mmol/L (ref 134–144)

## 2015-07-08 LAB — NMR, LIPOPROFILE
Cholesterol: 150 mg/dL (ref 100–199)
HDL Cholesterol by NMR: 54 mg/dL (ref >39)
HDL Particle Number: 28.5 umol/L — ABNORMAL LOW (ref >=30.5)
LDL PARTICLE NUMBER: 711 nmol/L (ref <1000)
LDL SIZE: 21 nm (ref >20.5)
LDL-C: 85 mg/dL (ref 0–99)
LP-IR SCORE: 31 (ref <=45)
SMALL LDL PARTICLE NUMBER: 202 nmol/L (ref <=527)
TRIGLYCERIDES BY NMR: 55 mg/dL (ref 0–149)

## 2015-07-08 LAB — HEPATIC FUNCTION PANEL
ALT: 18 IU/L (ref 0–44)
AST: 21 IU/L (ref 0–40)
Albumin: 4.3 g/dL (ref 3.5–4.7)
Alkaline Phosphatase: 67 IU/L (ref 39–117)
BILIRUBIN TOTAL: 0.6 mg/dL (ref 0.0–1.2)
BILIRUBIN, DIRECT: 0.16 mg/dL (ref 0.00–0.40)
Total Protein: 6.5 g/dL (ref 6.0–8.5)

## 2015-07-08 LAB — THYROID PANEL WITH TSH
Free Thyroxine Index: 2.2 (ref 1.2–4.9)
T3 UPTAKE RATIO: 33 % (ref 24–39)
T4 TOTAL: 6.6 ug/dL (ref 4.5–12.0)
TSH: 2.03 u[IU]/mL (ref 0.450–4.500)

## 2015-07-08 LAB — VITAMIN D 25 HYDROXY (VIT D DEFICIENCY, FRACTURES): VIT D 25 HYDROXY: 41.6 ng/mL (ref 30.0–100.0)

## 2015-07-08 LAB — VITAMIN B12: VITAMIN B 12: 588 pg/mL (ref 211–946)

## 2015-07-10 LAB — FERRITIN: Ferritin: 20 ng/mL — ABNORMAL LOW (ref 30–400)

## 2015-07-10 LAB — SPECIMEN STATUS REPORT

## 2015-07-10 LAB — IRON AND TIBC
IRON SATURATION: 15 % (ref 15–55)
Iron: 43 ug/dL (ref 38–169)
TIBC: 294 ug/dL (ref 250–450)
UIBC: 251 ug/dL (ref 111–343)

## 2015-07-10 LAB — VITAMIN B12: VITAMIN B 12: 609 pg/mL (ref 211–946)

## 2015-07-13 ENCOUNTER — Other Ambulatory Visit: Payer: MEDICARE

## 2015-07-13 DIAGNOSIS — Z1211 Encounter for screening for malignant neoplasm of colon: Secondary | ICD-10-CM | POA: Diagnosis not present

## 2015-07-14 LAB — FECAL OCCULT BLOOD, IMMUNOCHEMICAL: Fecal Occult Bld: NEGATIVE

## 2015-07-15 NOTE — Progress Notes (Signed)
We have a follow up with the patient next week and can review the changes in his EKG.

## 2015-07-20 ENCOUNTER — Ambulatory Visit (INDEPENDENT_AMBULATORY_CARE_PROVIDER_SITE_OTHER): Payer: MEDICARE | Admitting: Cardiology

## 2015-07-20 ENCOUNTER — Encounter (INDEPENDENT_AMBULATORY_CARE_PROVIDER_SITE_OTHER): Payer: Self-pay

## 2015-07-20 ENCOUNTER — Encounter: Payer: Self-pay | Admitting: Cardiology

## 2015-07-20 VITALS — BP 160/72 | HR 58 | Ht 72.0 in | Wt 184.0 lb

## 2015-07-20 DIAGNOSIS — I208 Other forms of angina pectoris: Secondary | ICD-10-CM

## 2015-07-20 DIAGNOSIS — I10 Essential (primary) hypertension: Secondary | ICD-10-CM

## 2015-07-20 DIAGNOSIS — D469 Myelodysplastic syndrome, unspecified: Secondary | ICD-10-CM

## 2015-07-20 DIAGNOSIS — I25810 Atherosclerosis of coronary artery bypass graft(s) without angina pectoris: Secondary | ICD-10-CM | POA: Diagnosis not present

## 2015-07-20 DIAGNOSIS — Z951 Presence of aortocoronary bypass graft: Secondary | ICD-10-CM

## 2015-07-20 DIAGNOSIS — I2089 Other forms of angina pectoris: Secondary | ICD-10-CM

## 2015-07-20 DIAGNOSIS — E785 Hyperlipidemia, unspecified: Secondary | ICD-10-CM

## 2015-07-20 MED ORDER — NITROGLYCERIN 0.4 MG SL SUBL: 0.4000 mg | SUBLINGUAL_TABLET | SUBLINGUAL | Status: AC | PRN

## 2015-07-20 NOTE — Addendum Note (Signed)
Addended by: Alvina Filbert B on: 07/20/2015 02:39 PM   Modules accepted: Orders

## 2015-07-20 NOTE — Assessment & Plan Note (Signed)
Hgb 11. Two weeks ago

## 2015-07-20 NOTE — Assessment & Plan Note (Signed)
CABG x 19 Oct 2012

## 2015-07-20 NOTE — Patient Instructions (Signed)
Medication Instructions:  Use your NTG under your tongue for recurrent chest pain. May take one tablet every 5 minutes. If you are still having discomfort after 3 tablets in 15 minutes, call 911.  Labwork: NONE  Testing/Procedures: Your physician has requested that you have en exercise stress myoview. For further information please visit [email or web address]. Please follow instruction sheet, as given. AS SOON AS POSSIBLE  Follow-Up: Your physician recommends that you schedule a follow-up appointment in: WITH DR Remington TEST   If you need a refill on your cardiac medications before your next appointment, please call your pharmacy.

## 2015-07-20 NOTE — Assessment & Plan Note (Signed)
Repeat B/P 142/ 70

## 2015-07-20 NOTE — Progress Notes (Signed)
07/20/2015 Jon Reid   02/12/1929  [id number]  Primary Physician Redge Gainer, MD Primary Cardiologist: Dr Percival Spanish [facility])  HPI:  Pleasant 82 y/o male followed by Dr Laurance Flatten and Dr Percival Spanish. 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 He had CABG x 3 in Aug 2014 and has done well since. He recently saw Dr Laurance Flatten and complained of exertional chest "tightness" when taking his evening walks. This has been going on for about 2 weeks. He saw Dr Laurance Flatten who suggested he arrange this appointment. He denies any radiation to his arms or jaw. He denies any associated diaphoresis. He admits his symptoms are similar to his pre CABG symptoms though not as bad. He does not think his symptoms are getting worse. He has no rest symptoms.    Current Outpatient Prescriptions  Medication Sig Dispense Refill  . aspirin EC 325 MG EC tablet Take 1 tablet (325 mg total) by mouth daily. 30 tablet 0  . cholecalciferol (VITAMIN D) 1000 UNITS tablet Take 2,000 Units by mouth daily.    . Cyanocobalamin (B-12 COMPLIANCE INJECTION) 1000 MCG/ML KIT Inject as directed every 30 (thirty) days.    . fluticasone (FLONASE) 50 MCG/ACT nasal spray One to 2 sprays each nostril at bedtime 16 g 6  . furosemide (LASIX) 40 MG tablet Take 1 tablet (40 mg total) by mouth daily. 30 tablet 4  . metoprolol succinate (TOPROL-XL) 25 MG 24 hr tablet TAKE 1 TABLET ONCE A DAY 30 tablet 6  . nitroGLYCERIN (NITROSTAT) 0.4 MG SL tablet Place 1 tablet (0.4 mg total) under the tongue every 5 (five) minutes as needed for chest pain. 25 tablet PRN  . triamcinolone cream (KENALOG) 0.1 % Apply 1 application topically 2 (two) times daily. 30 g 3   No current facility-administered medications for this visit.    Allergies  Allergen Reactions  . Crestor [Rosuvastatin]     GYNECOMASTIA AND SWELLING    Social History   Social History  .  Marital Status: Married    Spouse Name: N/A  . Number of Children: N/A  . Years of Education: N/A   Occupational History  . Retired    Social History Main Topics  . Smoking status: Never Smoker   . Smokeless tobacco: Never Used  . Alcohol Use: No  . Drug Use: No  . Sexual Activity: Not on file   Other Topics Concern  . Not on file   Social History Narrative   Married with 3 children     Review of Systems: General: negative for chills, fever, night sweats or weight changes.  Cardiovascular: negative for chest pain, dyspnea on exertion, edema, orthopnea, palpitations, paroxysmal nocturnal dyspnea or shortness of breath Dermatological: negative for rash Respiratory: negative for cough or wheezing Urologic: negative for hematuria Abdominal: negative for nausea, vomiting, diarrhea, bright red blood per rectum, melena, or hematemesis Neurologic: negative for visual changes, syncope, or dizziness All other systems reviewed and are otherwise negative except as noted above.    Blood pressure 160/72, pulse 58, height 6' (1.829 m), weight 184 lb (83.462 kg).  General appearance: alert, cooperative and no distress Neck: no JVD and soft RCA bruit Lungs: clear to auscultation bilaterally Heart: regular rate and rhythm and 2/6 short systolic murmur at Lt axillary area Abdomen: soft, non-tender; bowel sounds normal; no masses,  no organomegaly Extremities:  extremities normal, atraumatic, no cyanosis or edema Pulses: 2+ and symmetric Skin: Skin color, texture, turgor normal. No rashes or lesions Neurologic: Grossly normal  EKG NSR, 1st AVB, IVCD  ASSESSMENT AND PLAN:   Exertional angina (HCC) Onset two-three weeks ago. No rest symptoms. Not accelerating by his history  Hx of CABG CABG x 19 Oct 2012  Benign essential HTN Repeat B/P 142/ 70  Chronic renal insufficiency, stage III (moderate) Last SCr 1.4, GFR 53  Myelodysplastic syndrome Hgb 11. Two weeks  ago  Hyperlipemia Statin intolerant   PLAN  I suggested starting Imdur but Jon Reid says he is "not much for medicines". I did suggest we schedule an exercise Myoview ASAP and he was agreeable. I considered a cath but with his relativley stable symptoms, renal insuffiencey, and the pt's preference, decided to start with a Myoview. The pt understands he will need a cath if this is significantly abnormal. I di give him an updated NTG Rx.   Jon Reid K PA-C 07/20/2015 11:11 AM

## 2015-07-20 NOTE — Assessment & Plan Note (Signed)
Last SCr 1.4, GFR 53

## 2015-07-20 NOTE — Assessment & Plan Note (Signed)
Statin intolerant 

## 2015-07-20 NOTE — Assessment & Plan Note (Signed)
Onset two-three weeks ago. No rest symptoms. Not accelerating by his history

## 2015-07-21 ENCOUNTER — Ambulatory Visit: Payer: MEDICARE | Admitting: Family Medicine

## 2015-07-23 IMAGING — CR DG CHEST 2V
2 series · 2 of 2 positions shown · non-contrast
Comparison: 12/10/2012.

CLINICAL DATA: Palpitations, breast mass.

EXAM:
CHEST  2 VIEW

[view not recorded (1 of 2)]
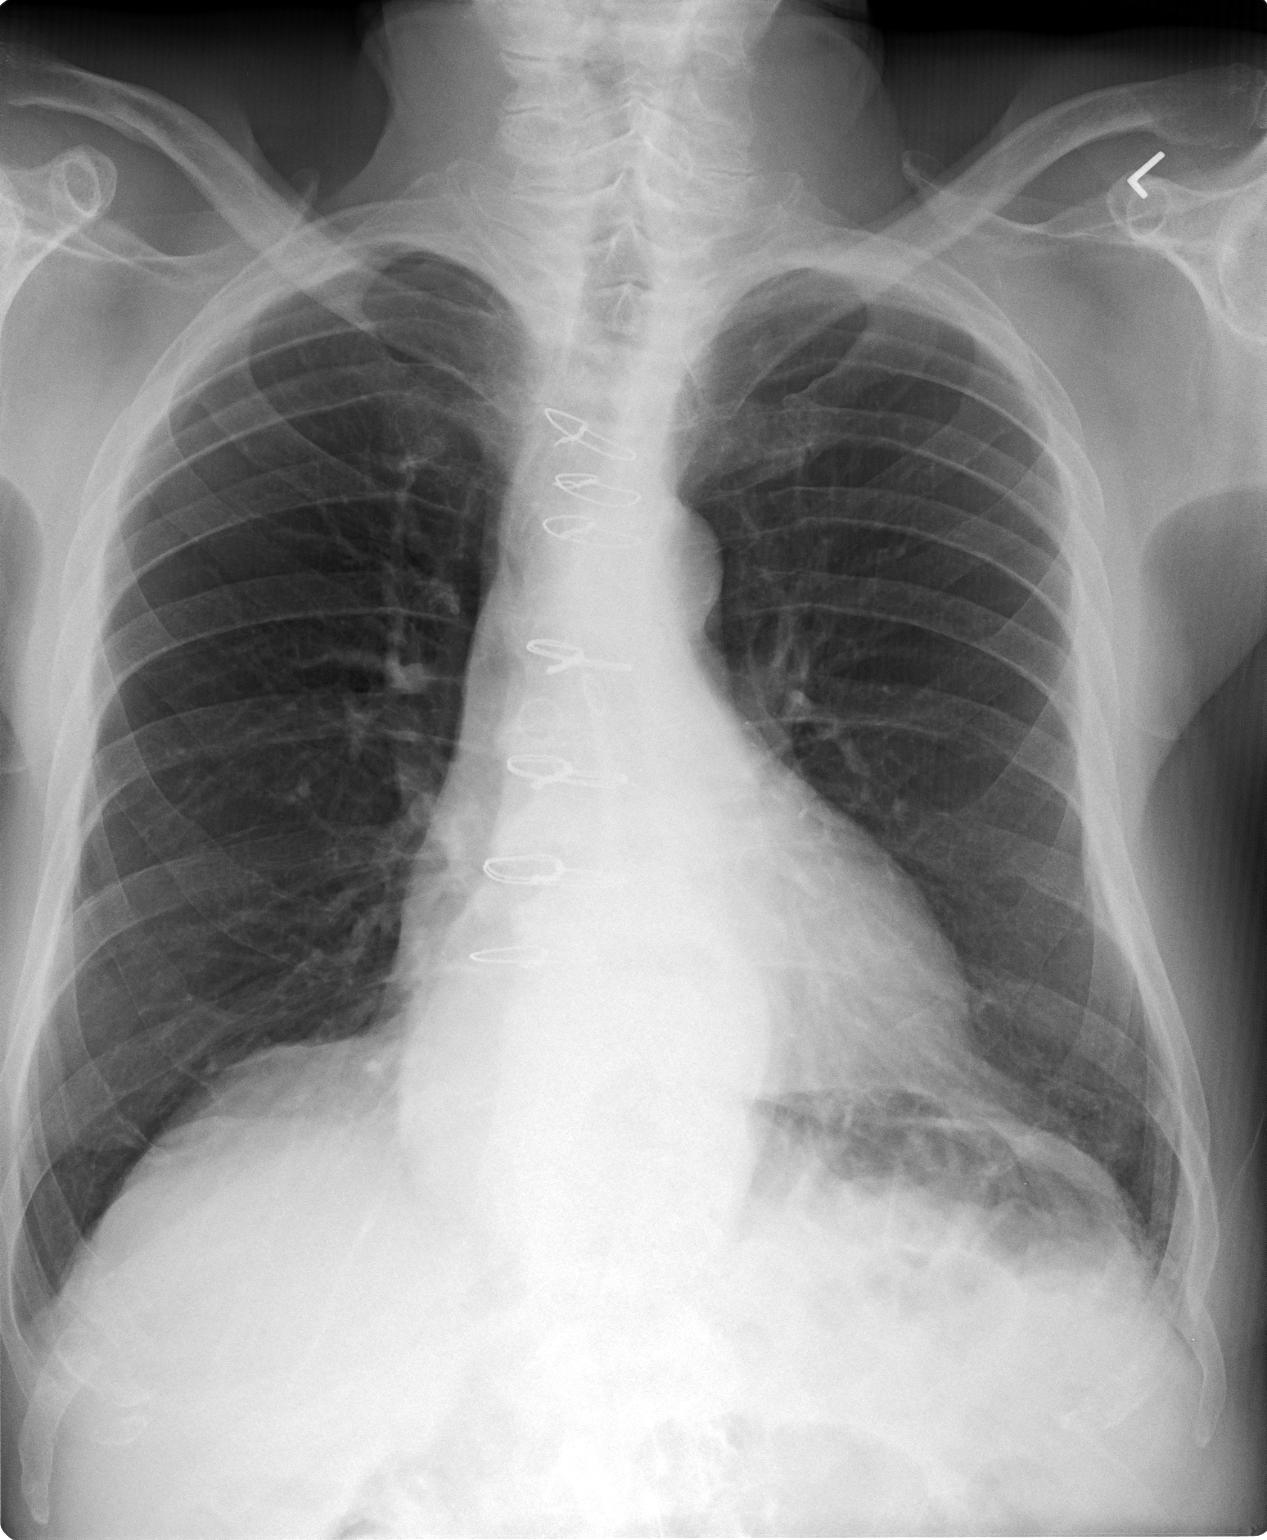

[view not recorded (2 of 2)]
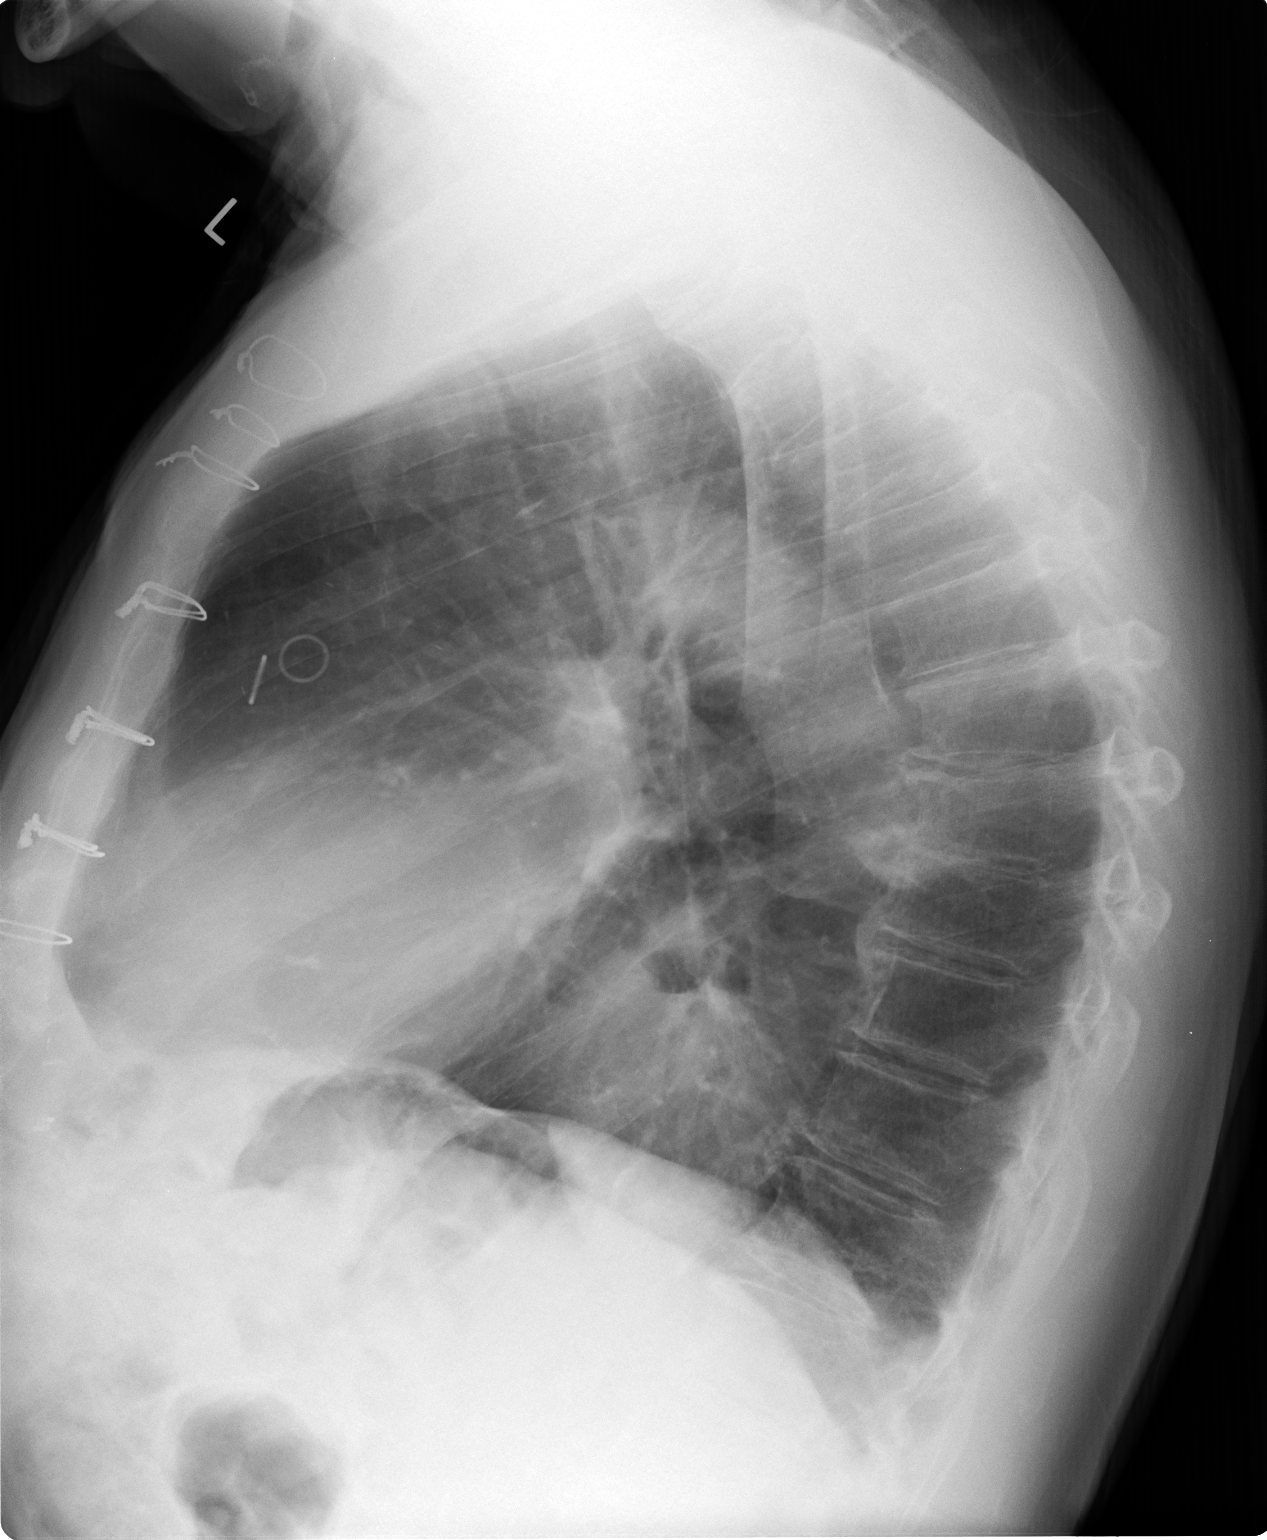

[2 of 2 positions shown; findings below may reference images not displayed]

FINDINGS: Trachea is midline. Heart size stable. Moderate hiatal hernia. Lungs
are clear. No pleural fluid. Degenerative changes are seen in the
spine.
IMPRESSION: 1. No acute findings.
2. Moderate hiatal hernia.

## 2015-07-25 ENCOUNTER — Encounter (HOSPITAL_COMMUNITY)
Admission: RE | Admit: 2015-07-25 | Discharge: 2015-07-25 | Disposition: A | Discharge status: Home / Self Care | Payer: MEDICARE | Source: Ambulatory Visit | Attending: Cardiology | Admitting: Cardiology

## 2015-07-25 DIAGNOSIS — R079 Chest pain, unspecified: Secondary | ICD-10-CM | POA: Diagnosis not present

## 2015-07-25 DIAGNOSIS — I208 Other forms of angina pectoris: Secondary | ICD-10-CM

## 2015-07-25 DIAGNOSIS — I25810 Atherosclerosis of coronary artery bypass graft(s) without angina pectoris: Secondary | ICD-10-CM | POA: Diagnosis not present

## 2015-07-25 DIAGNOSIS — I10 Essential (primary) hypertension: Secondary | ICD-10-CM | POA: Diagnosis not present

## 2015-07-25 LAB — NM MYOCAR MULTI W/SPECT W/WALL MOTION / EF
Peak HR: 76 {beats}/min
Rest HR: 64 {beats}/min

## 2015-07-25 MED ORDER — TECHNETIUM TC 99M SESTAMIBI GENERIC - CARDIOLITE
10.0000 | Freq: Once | INTRAVENOUS | Status: AC | PRN
  Administered 2015-07-25: 10 via INTRAVENOUS

## 2015-07-25 MED ORDER — REGADENOSON 0.4 MG/5ML IV SOLN
0.4000 mg | Freq: Once | INTRAVENOUS | Status: AC
  Administered 2015-07-25: 0.4 mg via INTRAVENOUS

## 2015-07-25 MED ORDER — REGADENOSON 0.4 MG/5ML IV SOLN
INTRAVENOUS | Status: AC
  Filled 2015-07-25: qty 5

## 2015-07-25 MED ORDER — TECHNETIUM TC 99M SESTAMIBI GENERIC - CARDIOLITE
30.0000 | Freq: Once | INTRAVENOUS | Status: AC | PRN
  Administered 2015-07-25: 30 via INTRAVENOUS

## 2015-07-26 ENCOUNTER — Other Ambulatory Visit: Payer: Self-pay | Admitting: Cardiology

## 2015-07-26 NOTE — Telephone Encounter (Signed)
Rx refill sent to pharmacy. 

## 2015-07-29 ENCOUNTER — Encounter (HOSPITAL_COMMUNITY): Payer: MEDICARE

## 2015-07-30 ENCOUNTER — Encounter: Payer: Self-pay | Admitting: Family Medicine

## 2015-07-30 ENCOUNTER — Ambulatory Visit (INDEPENDENT_AMBULATORY_CARE_PROVIDER_SITE_OTHER): Payer: MEDICARE | Admitting: Family Medicine

## 2015-07-30 VITALS — BP 145/65 | HR 56 | Temp 98.1°F | Ht 72.0 in | Wt 185.0 lb

## 2015-07-30 DIAGNOSIS — I208 Other forms of angina pectoris: Secondary | ICD-10-CM | POA: Diagnosis not present

## 2015-07-30 DIAGNOSIS — J209 Acute bronchitis, unspecified: Secondary | ICD-10-CM | POA: Diagnosis not present

## 2015-07-30 MED ORDER — CEFTRIAXONE SODIUM 1 G IJ SOLR
1.0000 g | INTRAMUSCULAR | Status: AC
  Administered 2015-07-30: 1 g via INTRAMUSCULAR

## 2015-07-30 NOTE — Progress Notes (Signed)
BP 145/65 mmHg  Pulse 56  Temp(Src) 98.1 F (36.7 C) (Oral)  Ht 6' (1.829 m)  Wt 185 lb (83.915 kg)  BMI 25.08 kg/m2   Subjective:    Patient ID: Jon Reid, male    DOB: 05/13/1928, 82 y.o.   MRN: [id number]  HPI: Jon Reid is a 82 y.o. male presenting on 07/30/2015 for Cough and Hoarse   HPI  Cough and chest congestion and hoarseness Patient has cough and hoarseness and sore throat and chest congestion that's been going on for the past few days. He was in the hospital doing some procedures prior to that and thinks that he is picked this up since then. He denies any fevers or chills or shortness of breath or wheezing. He is just mostly concerned because he didn't pick this up from the hospital when it started.  Relevant past medical, surgical, family and social history reviewed and updated as indicated. Interim medical history since our last visit reviewed. Allergies and medications reviewed and updated.  Review of Systems  Constitutional: Negative for fever and chills.  HENT: Positive for congestion, postnasal drip, rhinorrhea, sinus pressure, sneezing and sore throat. Negative for ear discharge, ear pain and voice change.   Eyes: Negative for pain, discharge, redness and visual disturbance.  Respiratory: Positive for cough. Negative for shortness of breath and wheezing.   Cardiovascular: Negative for chest pain and leg swelling.  Gastrointestinal: Negative for abdominal pain, diarrhea and constipation.  Genitourinary: Negative for difficulty urinating.  Musculoskeletal: Negative for back pain and gait problem.  Skin: Negative for rash.  Neurological: Negative for syncope, light-headedness and headaches.  All other systems reviewed and are negative.   Per HPI unless specifically indicated above     Medication List       This list is accurate as of: 07/30/15 11:01 AM.  Always use your most recent med list.               aspirin 325 MG EC tablet  Take 1  tablet (325 mg total) by mouth daily.     B-12 COMPLIANCE INJECTION 1000 MCG/ML Kit  Generic drug:  Cyanocobalamin  Inject as directed every 30 (thirty) days.     cholecalciferol 1000 units tablet  Commonly known as:  VITAMIN D  Take 2,000 Units by mouth daily.     fluticasone 50 MCG/ACT nasal spray  Commonly known as:  FLONASE  One to 2 sprays each nostril at bedtime     furosemide 40 MG tablet  Commonly known as:  LASIX  Take 1 tablet (40 mg total) by mouth daily.     metoprolol succinate 25 MG 24 hr tablet  Commonly known as:  TOPROL-XL  TAKE 1 TABLET ONCE A DAY     nitroGLYCERIN 0.4 MG SL tablet  Commonly known as:  NITROSTAT  Place 1 tablet (0.4 mg total) under the tongue every 5 (five) minutes as needed for chest pain.     triamcinolone cream 0.1 %  Commonly known as:  KENALOG  Apply 1 application topically 2 (two) times daily.           Objective:    BP 145/65 mmHg  Pulse 56  Temp(Src) 98.1 F (36.7 C) (Oral)  Ht 6' (1.829 m)  Wt 185 lb (83.915 kg)  BMI 25.08 kg/m2  Wt Readings from Last 3 Encounters:  07/30/15 185 lb (83.915 kg)  07/20/15 184 lb (83.462 kg)  07/07/15 184 lb (83.462 kg)  Physical Exam  Constitutional: He is oriented to person, place, and time. He appears well-developed and well-nourished. No distress.  HENT:  Right Ear: Tympanic membrane, external ear and ear canal normal.  Left Ear: Tympanic membrane, external ear and ear canal normal.  Nose: Mucosal edema and rhinorrhea present. No sinus tenderness. No epistaxis. Right sinus exhibits maxillary sinus tenderness. Right sinus exhibits no frontal sinus tenderness. Left sinus exhibits maxillary sinus tenderness. Left sinus exhibits no frontal sinus tenderness.  Mouth/Throat: Uvula is midline and mucous membranes are normal. Posterior oropharyngeal edema and posterior oropharyngeal erythema present. No oropharyngeal exudate or tonsillar abscesses.  Eyes: Conjunctivae and EOM are normal.  Pupils are equal, round, and reactive to light. Right eye exhibits no discharge. No scleral icterus.  Neck: Neck supple. No thyromegaly present.  Cardiovascular: Normal rate, regular rhythm, normal heart sounds and intact distal pulses.   No murmur heard. Pulmonary/Chest: Effort normal and breath sounds normal. No respiratory distress. He has no wheezes. He has no rales.  Musculoskeletal: Normal range of motion. He exhibits no edema.  Lymphadenopathy:    He has no cervical adenopathy.  Neurological: He is alert and oriented to person, place, and time. Coordination normal.  Skin: Skin is warm and dry. No rash noted. He is not diaphoretic.  Psychiatric: He has a normal mood and affect. His behavior is normal.  Nursing note and vitals reviewed.     Assessment & Plan:       Problem List Items Addressed This Visit    None    Visit Diagnoses    Acute bronchitis, unspecified organism    -  Primary    Recommended Flonase and antihistamine and nasal saline    Relevant Medications    cefTRIAXone (ROCEPHIN) injection 1 g (Start on 07/30/2015 11:15 AM)        Follow up plan: Return if symptoms worsen or fail to improve.  Counseling provided for all of the vaccine components No orders of the defined types were placed in this encounter.    Caryl Pina, MD [facility] 07/30/2015, 11:01 AM

## 2015-08-01 NOTE — Progress Notes (Signed)
No show

## 2015-08-02 ENCOUNTER — Encounter: Payer: MEDICARE | Admitting: Cardiology

## 2015-08-03 ENCOUNTER — Ambulatory Visit (INDEPENDENT_AMBULATORY_CARE_PROVIDER_SITE_OTHER): Payer: MEDICARE

## 2015-08-03 ENCOUNTER — Encounter: Payer: Self-pay | Admitting: Family Medicine

## 2015-08-03 ENCOUNTER — Ambulatory Visit (INDEPENDENT_AMBULATORY_CARE_PROVIDER_SITE_OTHER): Payer: MEDICARE | Admitting: Family Medicine

## 2015-08-03 VITALS — BP 122/61 | HR 64 | Temp 98.3°F | Ht 72.0 in | Wt 179.0 lb

## 2015-08-03 DIAGNOSIS — R05 Cough: Secondary | ICD-10-CM | POA: Diagnosis not present

## 2015-08-03 DIAGNOSIS — J40 Bronchitis, not specified as acute or chronic: Secondary | ICD-10-CM

## 2015-08-03 DIAGNOSIS — R059 Cough, unspecified: Secondary | ICD-10-CM

## 2015-08-03 DIAGNOSIS — I208 Other forms of angina pectoris: Secondary | ICD-10-CM | POA: Diagnosis not present

## 2015-08-03 DIAGNOSIS — J209 Acute bronchitis, unspecified: Secondary | ICD-10-CM

## 2015-08-03 MED ORDER — PREDNISONE 10 MG PO TABS: ORAL_TABLET | ORAL | Status: DC

## 2015-08-03 MED ORDER — METHYLPREDNISOLONE ACETATE 80 MG/ML IJ SUSP
60.0000 mg | Freq: Once | INTRAMUSCULAR | Status: AC
  Administered 2015-08-03: 60 mg via INTRAMUSCULAR

## 2015-08-03 MED ORDER — SULFAMETHOXAZOLE-TRIMETHOPRIM 800-160 MG PO TABS: 1.0000 | ORAL_TABLET | Freq: Two times a day (BID) | ORAL | Status: DC

## 2015-08-03 NOTE — Progress Notes (Signed)
Subjective:    Patient ID: Jon Reid, male    DOB: 1928/05/01, 82 y.o.   MRN: [id number]  HPI Patient here today for 3 week follow up on SOB and he also presents today with cough and congestion that started about 1 week ago. The patient's recent myocardial perfusion scan was reviewed and the patient had a left ventricular ejection fraction of 57% and it showed the focal infarct. There is hypokinesia along the proximal septum and this was considered a low risk stress test. He was considered to have normal pop function. The patient was notified of this by his cardiologist. He did come into the office 4 days ago and was given Flonase and 1 g of Rocephin. He was diagnosed with bronchitis at that time. He'll get a chest x-ray and CBC today. The patient's pulse ox today is 98%. He brings in outside blood pressures for review in a rescan into the record. He says the sputum day is more colored than it was yesterday.      Patient Active Problem List   Diagnosis Date Noted  . Exertional angina ([place]) 07/20/2015  . Hernia of abdominal wall 10/12/2014  . Gynecomastia 04/09/2013  . Hx of CABG 02/02/2013  . 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 Myelodysplastic syndrome 11/06/2012  . Accelerating angina ([place]) 11/06/2012  . Vitamin B 12 deficiency 07/07/2012  . Hyperlipemia 07/07/2012  . Macrocytosis without anemia 01/08/2012  . PVD 04/14/2009  . CARCINOMA, PROSTATE 04/13/2009  . VASCULAR PURPURA 04/13/2009  . Benign essential HTN 04/13/2009  . Chronic renal insufficiency, stage III (moderate) 04/13/2009  . SLEEP APNEA 04/13/2009  . ELECTROCARDIOGRAM, ABNORMAL 04/13/2009   Outpatient Encounter Prescriptions as of 08/03/2015  Medication Sig  . aspirin EC 325 MG EC tablet Take 1 tablet (325 mg total) by mouth daily.  . cholecalciferol (VITAMIN D) 1000 UNITS tablet Take 2,000 Units by mouth daily.  . Cyanocobalamin (B-12 COMPLIANCE INJECTION) 1000 MCG/ML KIT Inject as directed every 30 (thirty) days.  .  fluticasone (FLONASE) 50 MCG/ACT nasal spray One to 2 sprays each nostril at bedtime  . furosemide (LASIX) 40 MG tablet Take 1 tablet (40 mg total) by mouth daily.  . metoprolol succinate (TOPROL-XL) 25 MG 24 hr tablet TAKE 1 TABLET ONCE A DAY  . nitroGLYCERIN (NITROSTAT) 0.4 MG SL tablet Place 1 tablet (0.4 mg total) under the tongue every 5 (five) minutes as needed for chest pain.  Marland Kitchen triamcinolone cream (KENALOG) 0.1 % Apply 1 application topically 2 (two) times daily.   No facility-administered encounter medications on file as of 08/03/2015.      Review of Systems  Constitutional: Positive for fever.  HENT: Positive for congestion.   Eyes: Negative.   Respiratory: Positive for cough and shortness of breath.   Cardiovascular: Negative.   Gastrointestinal: Negative.   Endocrine: Negative.   Genitourinary: Negative.   Musculoskeletal: Negative.   Skin: Negative.   Allergic/Immunologic: Negative.   Neurological: Negative.   Hematological: Negative.   Psychiatric/Behavioral: Negative.        Objective:   Physical Exam  Constitutional: He is oriented to person, place, and time. He appears well-developed and well-nourished. No distress.  HENT:  Head: Normocephalic and atraumatic.  Right Ear: External ear normal.  Left Ear: External ear normal.  Nose: Nose normal.  Mouth/Throat: Oropharynx is clear and moist. No oropharyngeal exudate.  Eyes: Conjunctivae and EOM are normal. Pupils are equal, round, and reactive to light. Right eye exhibits no discharge. Left eye  exhibits no discharge. No scleral icterus.  Neck: Normal range of motion. Neck supple. No thyromegaly present.  Cardiovascular: Normal rate, regular rhythm and normal heart sounds.   No murmur heard. Pulmonary/Chest: Effort normal and breath sounds normal. No respiratory distress. He has no wheezes. He has no rales. He exhibits no tenderness.  Raspy cough, no rales  Abdominal: He exhibits no mass.  Musculoskeletal:  Normal range of motion. He exhibits no edema or tenderness.  Lymphadenopathy:    He has no cervical adenopathy.  Neurological: He is alert and oriented to person, place, and time.  Skin: Skin is warm and dry. No rash noted.  Psychiatric: He has a normal mood and affect. His behavior is normal. Judgment and thought content normal.  Nursing note and vitals reviewed.   BP 122/61 mmHg  Pulse 64  Temp(Src) 98.3 F (36.8 C) (Oral)  Ht 6' (1.829 m)  Wt 179 lb (81.194 kg)  BMI 24.27 kg/m2  [facility] reading (PRIMARY) by  Dr. Brunilda Payor x-ray with results pending                                      Assessment & Plan:  1. Cough -Take Mucinex for cough and drink plenty of fluids - CBC with Differential/Platelet - DG Chest 2 View; Future - methylPREDNISolone acetate (DEPO-MEDROL) injection 60 mg; Inject 0.75 mLs (60 mg total) into the muscle once.  2. Acute bronchitis, unspecified organism Take Septra DS 1 twice daily for 10 days  3. Bronchitis with bronchospasm -Use Brio inhaler 1 puff daily -Have patient call us on Friday for progress and recheck him in 6 days  Meds ordered this encounter  Medications  . sulfamethoxazole-trimethoprim (BACTRIM DS) 800-160 MG tablet    Sig: Take 1 tablet by mouth 2 (two) times daily.    Dispense:  20 tablet    Refill:  0  . methylPREDNISolone acetate (DEPO-MEDROL) injection 60 mg    Sig:    Patient Instructions  Take Mucinex twice daily maximum strength with a large glass of water Use inhaler once daily as directed Take antibiotic as directed We will call with results of CBC and chest x-ray as soon as those results become available Call us in 2 days for progress Depending on the CBC and chest x-ray we may add a prednisone taper Drink plenty of fluids and stay well hydrated   Arrie Senate MD

## 2015-08-03 NOTE — Addendum Note (Signed)
Addended by: Zannie Cove on: 08/03/2015 05:11 PM   Modules accepted: Orders

## 2015-08-03 NOTE — Patient Instructions (Addendum)
Take Mucinex twice daily maximum strength with a large glass of water Use inhaler once daily as directed Take antibiotic as directed We will call with results of CBC and chest x-ray as soon as those results become available Call us in 2 days for progress Depending on the CBC and chest x-ray we may add a prednisone taper Drink plenty of fluids and stay well hydrated

## 2015-08-04 LAB — CBC WITH DIFFERENTIAL/PLATELET
BASOS: 1 %
Basophils Absolute: 0 10*3/uL (ref 0.0–0.2)
EOS (ABSOLUTE): 0.1 10*3/uL (ref 0.0–0.4)
EOS: 1 %
HEMOGLOBIN: 10.5 g/dL — AB (ref 12.6–17.7)
Hematocrit: 32.3 % — ABNORMAL LOW (ref 37.5–51.0)
IMMATURE GRANS (ABS): 0 10*3/uL (ref 0.0–0.1)
IMMATURE GRANULOCYTES: 0 %
LYMPHS: 32 %
Lymphocytes Absolute: 2 10*3/uL (ref 0.7–3.1)
MCH: 30.7 pg (ref 26.6–33.0)
MCHC: 32.5 g/dL (ref 31.5–35.7)
MCV: 94 fL (ref 79–97)
MONOCYTES: 10 %
Monocytes Absolute: 0.6 10*3/uL (ref 0.1–0.9)
NEUTROS ABS: 3.5 10*3/uL (ref 1.4–7.0)
NEUTROS PCT: 56 %
Platelets: 246 10*3/uL (ref 150–379)
RBC: 3.42 x10E6/uL — ABNORMAL LOW (ref 4.14–5.80)
RDW: 15.5 % — AB (ref 12.3–15.4)
WBC: 6.2 10*3/uL (ref 3.4–10.8)

## 2015-08-09 ENCOUNTER — Ambulatory Visit (INDEPENDENT_AMBULATORY_CARE_PROVIDER_SITE_OTHER): Payer: MEDICARE | Admitting: Family Medicine

## 2015-08-09 ENCOUNTER — Encounter: Payer: Self-pay | Admitting: *Deleted

## 2015-08-09 ENCOUNTER — Encounter: Payer: Self-pay | Admitting: Family Medicine

## 2015-08-09 VITALS — BP 146/60 | HR 55 | Temp 97.7°F | Ht 72.0 in | Wt 179.0 lb

## 2015-08-09 DIAGNOSIS — I10 Essential (primary) hypertension: Secondary | ICD-10-CM

## 2015-08-09 DIAGNOSIS — E538 Deficiency of other specified B group vitamins: Secondary | ICD-10-CM

## 2015-08-09 DIAGNOSIS — J40 Bronchitis, not specified as acute or chronic: Secondary | ICD-10-CM

## 2015-08-09 DIAGNOSIS — D649 Anemia, unspecified: Secondary | ICD-10-CM

## 2015-08-09 DIAGNOSIS — I208 Other forms of angina pectoris: Secondary | ICD-10-CM

## 2015-08-09 DIAGNOSIS — J209 Acute bronchitis, unspecified: Secondary | ICD-10-CM

## 2015-08-09 DIAGNOSIS — R710 Precipitous drop in hematocrit: Secondary | ICD-10-CM

## 2015-08-09 MED ORDER — CYANOCOBALAMIN 1000 MCG/ML IJ SOLN
1000.0000 ug | INTRAMUSCULAR | Status: AC
  Administered 2015-08-09 – 2016-07-16 (×11): 1000 ug via INTRAMUSCULAR

## 2015-08-09 NOTE — Progress Notes (Signed)
Subjective:    Patient ID: Jon Reid, male    DOB: Apr 12, 1928, 82 y.o.   MRN: [id number]  HPI Patient here today for 1 week follow up on cough and congestion. He states he feels some better.The patient is taking Septra DS and is doing a course of prednisone. Recent CBC had a normal white blood cell count. The hemoglobin remains in the 10 range. The chest x-ray did not show any active disease. The patient is doing some better but still has a cough. The sputum production is diminished and it is no longer yellow in color. The patient has seen Dr. Earlie Server in the past regarding his low hemoglobin. We will send him some information pertaining to the patient's symptoms of fatigue and weakness and shortness of breath. He denies any chest pain. He has seen the cardiologist/PA recently.      Patient Active Problem List   Diagnosis Date Noted  . 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 Hernia of abdominal wall 10/12/2014  . Gynecomastia 04/09/2013  . Hx of CABG 02/02/2013  . 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 Myelodysplastic syndrome 11/06/2012  . Accelerating angina ([place]) 11/06/2012  . Vitamin B 12 deficiency 07/07/2012  . Hyperlipemia 07/07/2012  . Macrocytosis without anemia 01/08/2012  . PVD 04/14/2009  . CARCINOMA, PROSTATE 04/13/2009  . VASCULAR PURPURA 04/13/2009  . Benign essential HTN 04/13/2009  . Chronic renal insufficiency, stage III (moderate) 04/13/2009  . SLEEP APNEA 04/13/2009  . ELECTROCARDIOGRAM, ABNORMAL 04/13/2009   Outpatient Encounter Prescriptions as of 08/09/2015  Medication Sig  . aspirin EC 325 MG EC tablet Take 1 tablet (325 mg total) by mouth daily.  . cholecalciferol (VITAMIN D) 1000 UNITS tablet Take 2,000 Units by mouth daily.  . Cyanocobalamin (B-12 COMPLIANCE INJECTION) 1000 MCG/ML KIT Inject as directed every 30 (thirty) days.  . fluticasone (FLONASE) 50 MCG/ACT nasal spray One to 2 sprays each nostril at bedtime  . furosemide (LASIX) 40 MG tablet Take 1 tablet  (40 mg total) by mouth daily.  . metoprolol succinate (TOPROL-XL) 25 MG 24 hr tablet TAKE 1 TABLET ONCE A DAY  . nitroGLYCERIN (NITROSTAT) 0.4 MG SL tablet Place 1 tablet (0.4 mg total) under the tongue every 5 (five) minutes as needed for chest pain.  . predniSONE (DELTASONE) 10 MG tablet Take 1 tab QID x 2 days, then 1 tab TID x 2 days, then 1 tab BID x 2 days, then 1 tab QD x 2 days  . sulfamethoxazole-trimethoprim (BACTRIM DS) 800-160 MG tablet Take 1 tablet by mouth 2 (two) times daily.  Marland Kitchen triamcinolone cream (KENALOG) 0.1 % Apply 1 application topically 2 (two) times daily.   No facility-administered encounter medications on file as of 08/09/2015.     Review of Systems  Constitutional: Negative.   HENT: Positive for congestion.   Eyes: Negative.   Respiratory: Positive for cough.   Cardiovascular: Negative.   Gastrointestinal: Negative.   Endocrine: Negative.   Genitourinary: Negative.   Musculoskeletal: Negative.   Skin: Negative.   Allergic/Immunologic: Negative.   Neurological: Negative.   Hematological: Negative.   Psychiatric/Behavioral: Negative.        Objective:   Physical Exam  Constitutional: He is oriented to person, place, and time. He appears well-developed and well-nourished. No distress.  HENT:  Head: Normocephalic and atraumatic.  Right Ear: External ear normal.  Left Ear: External ear normal.  Nose: Nose normal.  Mouth/Throat: Oropharynx is clear and moist. No oropharyngeal exudate.  Eyes: Conjunctivae and  EOM are normal. Pupils are equal, round, and reactive to light. Right eye exhibits no discharge. Left eye exhibits no discharge. No scleral icterus.  Neck: Normal range of motion. Neck supple. No thyromegaly present.  Cardiovascular: Normal rate and regular rhythm.   Murmur heard. The heart was regular with a grade 2 to 3/6 systolic ejection murmur  Pulmonary/Chest: Effort normal. No respiratory distress. He has no wheezes. He has no rales. He  exhibits no tenderness.  Lungs were clear anteriorly and posteriorly but increased upper airway congestion with coughing.  Musculoskeletal: Normal range of motion. He exhibits no edema.  Lymphadenopathy:    He has no cervical adenopathy.  Neurological: He is alert and oriented to person, place, and time.  Skin: Skin is warm and dry. No rash noted.  Psychiatric: He has a normal mood and affect. His behavior is normal. Judgment and thought content normal.  Nursing note and vitals reviewed.  BP 146/60 mmHg  Pulse 55  Temp(Src) 97.7 F (36.5 C) (Oral)  Ht 6' (1.829 m)  Wt 179 lb (81.194 kg)  BMI 24.27 kg/m2        Assessment & Plan:  1. Vitamin B 12 deficiency - cyanocobalamin ((VITAMIN B-12)) injection 1,000 mcg; Inject 1 mL (1,000 mcg total) into the muscle every 30 (thirty) days.  2. Bronchitis with bronchospasm -Continue and complete antibiotic and prednisone -Continue with Brio inhaler 1 puff daily and rinse mouth after using -Continue with Mucinex -Recheck in 2 - 3 weeks  3. Benign essential HTN -Continue with current blood pressure medicine  4. B12 deficiency -B12 injection today  5. Decreased hemoglobin -Discuss the persistent decreased hemoglobin with hematology.  Patient Instructions  Finish antibiotic and complete the course of prednisone Continue with Brio inhaler 1 puff once daily and rinse mouth after using Continue to drink plenty of fluids and stay well hydrated Continue with Mucinex one twice daily for cough and congestion When rechecking you again in 2-3 weeks we will then consider if you're not better to have you see a pulmonologist. We will also discuss with Dr. Earlie Server, the low hemoglobin to see if there's any other thoughts about what we can do to improve this. Hoping that if we can improve this and will help you with having more energy.    Arrie Senate MD

## 2015-08-09 NOTE — Patient Instructions (Signed)
Finish antibiotic and complete the course of prednisone Continue with Brio inhaler 1 puff once daily and rinse mouth after using Continue to drink plenty of fluids and stay well hydrated Continue with Mucinex one twice daily for cough and congestion When rechecking you again in 2-3 weeks we will then consider if you're not better to have you see a pulmonologist. We will also discuss with Dr. Earlie Server, the low hemoglobin to see if there's any other thoughts about what we can do to improve this. Hoping that if we can improve this and will help you with having more energy.

## 2015-08-09 NOTE — Progress Notes (Unsigned)
Please review pt's labs ( decreased HGB)  If there is any concern or you would recommend any additional follow up  -please call our office at [phone] - Georgina Pillion, LPN / Dr Redge Gainer

## 2015-08-10 ENCOUNTER — Other Ambulatory Visit: Payer: MEDICARE

## 2015-08-10 ENCOUNTER — Other Ambulatory Visit: Payer: Self-pay | Admitting: *Deleted

## 2015-08-10 DIAGNOSIS — R710 Precipitous drop in hematocrit: Secondary | ICD-10-CM

## 2015-08-10 DIAGNOSIS — D649 Anemia, unspecified: Secondary | ICD-10-CM | POA: Diagnosis not present

## 2015-08-10 NOTE — Progress Notes (Signed)
Gradual decline. May need reevaluation with anemia panel. Thank you.

## 2015-08-11 LAB — ANEMIA PROFILE B
Basophils Absolute: 0 10*3/uL (ref 0.0–0.2)
Basos: 0 %
EOS (ABSOLUTE): 0 10*3/uL (ref 0.0–0.4)
Eos: 0 %
FERRITIN: 25 ng/mL — AB (ref 30–400)
FOLATE: 8.8 ng/mL (ref >3.0)
HEMATOCRIT: 33.7 % — AB (ref 37.5–51.0)
Hemoglobin: 11.6 g/dL — ABNORMAL LOW (ref 12.6–17.7)
IMMATURE GRANULOCYTES: 0 %
IRON SATURATION: 13 % — AB (ref 15–55)
Immature Grans (Abs): 0 10*3/uL (ref 0.0–0.1)
Iron: 38 ug/dL (ref 38–169)
LYMPHS: 16 %
Lymphocytes Absolute: 1.7 10*3/uL (ref 0.7–3.1)
MCH: 31.3 pg (ref 26.6–33.0)
MCHC: 34.4 g/dL (ref 31.5–35.7)
MCV: 91 fL (ref 79–97)
MONOCYTES: 4 %
Monocytes Absolute: 0.4 10*3/uL (ref 0.1–0.9)
NEUTROS ABS: 8.6 10*3/uL — AB (ref 1.4–7.0)
Neutrophils: 80 %
PLATELETS: 350 10*3/uL (ref 150–379)
RBC: 3.71 x10E6/uL — ABNORMAL LOW (ref 4.14–5.80)
RDW: 14.9 % (ref 12.3–15.4)
Retic Ct Pct: 1.3 % (ref 0.6–2.6)
TIBC: 290 ug/dL (ref 250–450)
UIBC: 252 ug/dL (ref 111–343)
Vitamin B-12: >2000 pg/mL — ABNORMAL HIGH (ref 211–946)
WBC: 10.8 10*3/uL (ref 3.4–10.8)

## 2015-08-12 ENCOUNTER — Telehealth: Payer: Self-pay | Admitting: *Deleted

## 2015-08-12 DIAGNOSIS — R710 Precipitous drop in hematocrit: Secondary | ICD-10-CM

## 2015-08-12 MED ORDER — INTEGRA PLUS PO CAPS: 1.0000 | ORAL_CAPSULE | Freq: Every day | ORAL | Status: DC

## 2015-08-12 NOTE — Telephone Encounter (Signed)
Pt aware of suggestion from Dr Inda Merlin and Dr Laurance Flatten. He will start on Integra plus today  - 1 a day and he will have labs done in 2 months. (cbc, ferritin, TIBC)

## 2015-08-26 ENCOUNTER — Other Ambulatory Visit: Payer: MEDICARE

## 2015-08-26 DIAGNOSIS — D649 Anemia, unspecified: Secondary | ICD-10-CM | POA: Diagnosis not present

## 2015-08-26 DIAGNOSIS — R710 Precipitous drop in hematocrit: Secondary | ICD-10-CM

## 2015-08-27 LAB — CBC WITH DIFFERENTIAL/PLATELET
BASOS: 1 %
Basophils Absolute: 0 10*3/uL (ref 0.0–0.2)
EOS (ABSOLUTE): 0.1 10*3/uL (ref 0.0–0.4)
Eos: 2 %
Hematocrit: 35.5 % — ABNORMAL LOW (ref 37.5–51.0)
Hemoglobin: 12 g/dL — ABNORMAL LOW (ref 12.6–17.7)
IMMATURE GRANULOCYTES: 0 %
Immature Grans (Abs): 0 10*3/uL (ref 0.0–0.1)
Lymphocytes Absolute: 2.4 10*3/uL (ref 0.7–3.1)
Lymphs: 37 %
MCH: 32.1 pg (ref 26.6–33.0)
MCHC: 33.8 g/dL (ref 31.5–35.7)
MCV: 95 fL (ref 79–97)
MONOS ABS: 0.6 10*3/uL (ref 0.1–0.9)
Monocytes: 10 %
NEUTROS PCT: 50 %
Neutrophils Absolute: 3.3 10*3/uL (ref 1.4–7.0)
PLATELETS: 260 10*3/uL (ref 150–379)
RBC: 3.74 x10E6/uL — ABNORMAL LOW (ref 4.14–5.80)
RDW: 16.9 % — AB (ref 12.3–15.4)
WBC: 6.5 10*3/uL (ref 3.4–10.8)

## 2015-08-27 LAB — IRON AND TIBC
Iron Saturation: 92 % (ref 15–55)
Iron: 252 ug/dL — ABNORMAL HIGH (ref 38–169)
Total Iron Binding Capacity: 273 ug/dL (ref 250–450)
UIBC: 21 ug/dL — AB (ref 111–343)

## 2015-08-27 LAB — FERRITIN: Ferritin: 130 ng/mL (ref 30–400)

## 2015-08-29 ENCOUNTER — Other Ambulatory Visit: Payer: Self-pay | Admitting: *Deleted

## 2015-08-29 DIAGNOSIS — R7989 Other specified abnormal findings of blood chemistry: Secondary | ICD-10-CM

## 2015-09-07 ENCOUNTER — Ambulatory Visit: Payer: MEDICARE | Admitting: Cardiology

## 2015-09-16 ENCOUNTER — Ambulatory Visit (INDEPENDENT_AMBULATORY_CARE_PROVIDER_SITE_OTHER): Payer: MEDICARE | Admitting: *Deleted

## 2015-09-16 DIAGNOSIS — E538 Deficiency of other specified B group vitamins: Secondary | ICD-10-CM

## 2015-09-16 NOTE — Progress Notes (Signed)
Pt given Vit B12 inj Tolerated well 

## 2015-09-28 ENCOUNTER — Other Ambulatory Visit: Payer: Self-pay | Admitting: Cardiology

## 2015-09-28 ENCOUNTER — Other Ambulatory Visit: Payer: MEDICARE

## 2015-09-28 DIAGNOSIS — R7989 Other specified abnormal findings of blood chemistry: Secondary | ICD-10-CM | POA: Diagnosis not present

## 2015-09-29 LAB — CBC WITH DIFFERENTIAL/PLATELET
BASOS ABS: 0 10*3/uL (ref 0.0–0.2)
Basos: 0 %
EOS (ABSOLUTE): 0 10*3/uL (ref 0.0–0.4)
Eos: 0 %
Hematocrit: 38.1 % (ref 37.5–51.0)
Hemoglobin: 12.9 g/dL (ref 12.6–17.7)
Immature Grans (Abs): 0 10*3/uL (ref 0.0–0.1)
Immature Granulocytes: 0 %
LYMPHS ABS: 1.6 10*3/uL (ref 0.7–3.1)
Lymphs: 17 %
MCH: 33 pg (ref 26.6–33.0)
MCHC: 33.9 g/dL (ref 31.5–35.7)
MCV: 97 fL (ref 79–97)
MONOS ABS: 0.7 10*3/uL (ref 0.1–0.9)
Monocytes: 7 %
Neutrophils Absolute: 7.2 10*3/uL — ABNORMAL HIGH (ref 1.4–7.0)
Neutrophils: 76 %
Platelets: 227 10*3/uL (ref 150–379)
RBC: 3.91 x10E6/uL — AB (ref 4.14–5.80)
RDW: 17.2 % — AB (ref 12.3–15.4)
WBC: 9.5 10*3/uL (ref 3.4–10.8)

## 2015-09-29 LAB — FERRITIN: FERRITIN: 59 ng/mL (ref 30–400)

## 2015-09-29 LAB — IRON: IRON: 104 ug/dL (ref 38–169)

## 2015-10-17 ENCOUNTER — Telehealth: Payer: Self-pay | Admitting: Family Medicine

## 2015-10-17 NOTE — Telephone Encounter (Signed)
Pt aware no labs due until next appt

## 2015-10-18 ENCOUNTER — Ambulatory Visit (INDEPENDENT_AMBULATORY_CARE_PROVIDER_SITE_OTHER): Payer: MEDICARE | Admitting: *Deleted

## 2015-10-18 DIAGNOSIS — E538 Deficiency of other specified B group vitamins: Secondary | ICD-10-CM | POA: Diagnosis not present

## 2015-10-18 NOTE — Progress Notes (Signed)
Pt given B12 injection IM left deltoid and tolerated well. °

## 2015-10-27 ENCOUNTER — Encounter: Payer: Self-pay | Admitting: *Deleted

## 2015-11-25 ENCOUNTER — Ambulatory Visit: Payer: MEDICARE | Admitting: Family Medicine

## 2015-11-30 ENCOUNTER — Telehealth: Payer: Self-pay | Admitting: Neurology

## 2015-11-30 ENCOUNTER — Ambulatory Visit (INDEPENDENT_AMBULATORY_CARE_PROVIDER_SITE_OTHER): Payer: MEDICARE

## 2015-11-30 ENCOUNTER — Encounter: Payer: Self-pay | Admitting: Family Medicine

## 2015-11-30 ENCOUNTER — Ambulatory Visit (INDEPENDENT_AMBULATORY_CARE_PROVIDER_SITE_OTHER): Payer: MEDICARE | Admitting: Family Medicine

## 2015-11-30 VITALS — BP 145/69 | HR 60 | Temp 97.9°F | Ht 72.0 in | Wt 181.0 lb

## 2015-11-30 DIAGNOSIS — E559 Vitamin D deficiency, unspecified: Secondary | ICD-10-CM | POA: Diagnosis not present

## 2015-11-30 DIAGNOSIS — R29898 Other symptoms and signs involving the musculoskeletal system: Secondary | ICD-10-CM | POA: Diagnosis not present

## 2015-11-30 DIAGNOSIS — I208 Other forms of angina pectoris: Secondary | ICD-10-CM | POA: Diagnosis not present

## 2015-11-30 DIAGNOSIS — R2689 Other abnormalities of gait and mobility: Secondary | ICD-10-CM | POA: Diagnosis not present

## 2015-11-30 DIAGNOSIS — R531 Weakness: Secondary | ICD-10-CM

## 2015-11-30 DIAGNOSIS — I10 Essential (primary) hypertension: Secondary | ICD-10-CM | POA: Diagnosis not present

## 2015-11-30 DIAGNOSIS — E538 Deficiency of other specified B group vitamins: Secondary | ICD-10-CM

## 2015-11-30 DIAGNOSIS — E785 Hyperlipidemia, unspecified: Secondary | ICD-10-CM

## 2015-11-30 NOTE — Progress Notes (Signed)
Subjective:    Patient ID: Jon Reid, male    DOB: 1928/10/25, 82 y.o.   MRN: [id number]  HPI Pt here for follow up and management of chronic medical problems which includes hypertension and hyperlipidemia. He is taking medications regularly.The patient continues to have lower extremity weakness and gait instability. This is been going on for several months. He is due to get lab work today. He sees the urologist for his rectal exams because of the prostate cancer history. He is also followed regularly by the cardiologist. The patient denies any chest pain or shortness of breath. He denies any problems with his intestinal tract including nausea vomiting diarrhea blood in the stool or black tarry bowel movements. He is passing his water well and sees the urologist regularly because of his history of prostate cancer. He does complaint today is his generalized weakness but especially weakness and gait instability with his lower extremities. He also brings up the fact that he received a letter saying there were contaminants in the operating room when he had his by past surgery in the refrigerator. We are not sure what the significance of this is and we will try to investigate this more. The letter away and has lost the letter but is told him to touch base with his primary care physician. The patient indicates that his blood pressures at home run in the 110-120 range over the 70s.     Patient Active Problem List   Diagnosis Date Noted  . Exertional angina ([place]) 07/20/2015  . Hernia of abdominal wall 10/12/2014  . Gynecomastia 04/09/2013  . Hx of CABG 02/02/2013  . 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 Myelodysplastic syndrome 11/06/2012  . Accelerating angina ([place]) 11/06/2012  . Vitamin B 12 deficiency 07/07/2012  . Hyperlipemia 07/07/2012  . Macrocytosis without anemia 01/08/2012  . PVD 04/14/2009  . CARCINOMA, PROSTATE 04/13/2009  . VASCULAR PURPURA 04/13/2009  . Benign essential HTN 04/13/2009  .  Chronic renal insufficiency, stage III (moderate) 04/13/2009  . SLEEP APNEA 04/13/2009  . ELECTROCARDIOGRAM, ABNORMAL 04/13/2009   Outpatient Encounter Prescriptions as of 11/30/2015  Medication Sig  . aspirin EC 325 MG EC tablet Take 1 tablet (325 mg total) by mouth daily.  . cholecalciferol (VITAMIN D) 1000 UNITS tablet Take 2,000 Units by mouth daily.  . Cyanocobalamin (B-12 COMPLIANCE INJECTION) 1000 MCG/ML KIT Inject as directed every 30 (thirty) days.  . FeFum-FePoly-FA-B Cmp-C-Biot (INTEGRA PLUS) CAPS Take 1 capsule by mouth daily.  . fluticasone (FLONASE) 50 MCG/ACT nasal spray One to 2 sprays each nostril at bedtime  . furosemide (LASIX) 40 MG tablet Take 1 tablet (40 mg total) by mouth daily.  . metoprolol succinate (TOPROL-XL) 25 MG 24 hr tablet Take 1 tablet (25 mg total) by mouth daily.  . nitroGLYCERIN (NITROSTAT) 0.4 MG SL tablet Place 1 tablet (0.4 mg total) under the tongue every 5 (five) minutes as needed for chest pain.  . predniSONE (DELTASONE) 10 MG tablet Take 1 tab QID x 2 days, then 1 tab TID x 2 days, then 1 tab BID x 2 days, then 1 tab QD x 2 days  . sulfamethoxazole-trimethoprim (BACTRIM DS) 800-160 MG tablet Take 1 tablet by mouth 2 (two) times daily.  Marland Kitchen triamcinolone cream (KENALOG) 0.1 % Apply 1 application topically 2 (two) times daily.   Facility-Administered Encounter Medications as of 11/30/2015  Medication  . cyanocobalamin ((VITAMIN B-12)) injection 1,000 mcg     Review of Systems  HENT: Negative.  Eyes: Negative.   Respiratory: Negative.   Cardiovascular: Negative.   Gastrointestinal: Negative.   Endocrine: Negative.   Genitourinary: Negative.   Musculoskeletal: Negative.   Skin: Negative.   Allergic/Immunologic: Negative.   Neurological: Positive for weakness.       Off balance - walks "all over"/ not straight  Hematological: Negative.   Psychiatric/Behavioral: Negative.        Objective:   Physical Exam  Constitutional: He is oriented  to person, place, and time. He appears well-developed and well-nourished. No distress.  HENT:  Head: Normocephalic.  Right Ear: External ear normal.  Left Ear: External ear normal.  Nose: Nose normal.  Mouth/Throat: Oropharynx is clear and moist. No oropharyngeal exudate.  The patient has an abrasion on his superior occiput from a fall recently. The pupils were equal round and reactive to light bilaterally and both disc appeared sharp.  Eyes: Conjunctivae and EOM are normal. Pupils are equal, round, and reactive to light. Right eye exhibits no discharge. Left eye exhibits no discharge. No scleral icterus.  Neck: Normal range of motion. Neck supple. No thyromegaly present.  Cardiovascular: Normal rate, regular rhythm, normal heart sounds and intact distal pulses.   No murmur heard. The heart has a regular rate and rhythm at 60/m  Pulmonary/Chest: Effort normal and breath sounds normal. No respiratory distress. He has no wheezes. He has no rales. He exhibits no tenderness.  Clear anteriorly and posteriorly with no axillary adenopathy  Abdominal: Soft. Bowel sounds are normal. He exhibits no mass. There is no tenderness. There is no rebound and no guarding.  No abdominal tenderness liver or spleen enlargement or inguinal adenopathy.  Genitourinary:  Genitourinary Comments: Patient is followed by the urologist regularly.  Musculoskeletal: Normal range of motion. He exhibits no edema or tenderness.  Lymphadenopathy:    He has no cervical adenopathy.  Neurological: He is alert and oriented to person, place, and time. He has normal reflexes. No cranial nerve deficit.  Deep tendon reflexes in the lower extremity are equal bilaterally.  Skin: Skin is warm and dry. No rash noted.  Psychiatric: He has a normal mood and affect. His behavior is normal. Judgment and thought content normal.  Nursing note and vitals reviewed.  BP (!) 145/69 (BP Location: Left Arm)   Pulse 60   Temp 97.9 F (36.6 C)  (Oral)   Ht 6' (1.829 m)   Wt 181 lb (82.1 kg)   BMI 24.55 kg/m   [facility] reading (PRIMARY) by  Dr. Bobbe Medico spine films with results pending                                        Assessment & Plan:  1. Benign essential HTN -The patient's blood pressures are consistently good at home. His blood pressures in the office are slightly elevated and there will be no change in his current treatment. - BMP8+EGFR - CBC with Differential/Platelet - Hepatic function panel  2. Hyperlipemia -The patient has been intolerant of statins in the past with breast enlargement. - CBC with Differential/Platelet - Lipid panel  3. Vitamin D deficiency -He should continue current treatment pending results of lab work - CBC with Differential/Platelet - VITAMIN D 25 Hydroxy (Vit-D Deficiency, Fractures)  4. Vitamin B 12 deficiency -He will receive a B12 injection today and we will check a B12 level. - CBC with Differential/Platelet - Vitamin B12  5. Abnormality  of gait due to impairment of balance - DG Lumbar Spine 2-3 Views; Future - Ambulatory referral to Neurology  6. Leg weakness, bilateral - Sedimentation rate - Thyroid Panel With TSH - DG Lumbar Spine 2-3 Views; Future - Ambulatory referral to Neurology  7. Weakness -There is also some generalized weakness. We will check lab work additionally today in order to make sure there is not any inflammatory condition going on that we are not aware of.  Patient Instructions                       Medicare Annual Wellness Visit  [facility] and the medical providers at [facility] strive to bring you the best medical care.  In doing so we not only want to address your current medical conditions and concerns but also to detect new conditions early and prevent illness, disease and health-related problems.    Medicare offers a yearly Wellness Visit which allows our clinical staff to assess your need for preventative services  including immunizations, lifestyle education, counseling to decrease risk of preventable diseases and screening for fall risk and other medical concerns.    This visit is provided free of charge (no copay) for all Medicare recipients. The clinical pharmacists at [facility] have begun to conduct these Wellness Visits which will also include a thorough review of all your medications.    As you primary medical provider recommend that you make an appointment for your Annual Wellness Visit if you have not done so already this year.  You may set up this appointment before you leave today or you may call back [phone]) and schedule an appointment.  Please make sure when you call that you mention that you are scheduling your Annual Wellness Visit with the clinical pharmacist so that the appointment may be made for the proper length of time.    Continue current medications. Continue good therapeutic lifestyle changes which include good diet and exercise. Fall precautions discussed with patient. If an FOBT was given today- please return it to our front desk. If you are over 63 years old - you may need Prevnar 18 or the adult Pneumonia vaccine.  **Flu shots are available--- please call and schedule a FLU-CLINIC appointment**  After your visit with [name] today you will receive a survey in the mail or online from [employer] regarding your care with [name]. Please take a moment to fill this out. Your feedback is very important to [name] as you can help [name] better understand your patient needs as well as improve your experience and satisfaction. WE CARE ABOUT YOU!!!   We will arrange for the patient to see a neurologist because of the lower extremity weakness and the generalized weakness. We will further investigate the information supporting the letter that he received from the hospital saying that during his bypass surgery that the refrigerator had contaminants in it?? The patient should make sure  that he gets his flu shot October 1 He should continue to be careful not put himself at risk for falling   Arrie Senate MD

## 2015-11-30 NOTE — Patient Instructions (Addendum)
Medicare Annual Wellness Visit  [facility] and the medical providers at [facility] strive to bring you the best medical care.  In doing so we not only want to address your current medical conditions and concerns but also to detect new conditions early and prevent illness, disease and health-related problems.    Medicare offers a yearly Wellness Visit which allows our clinical staff to assess your need for preventative services including immunizations, lifestyle education, counseling to decrease risk of preventable diseases and screening for fall risk and other medical concerns.    This visit is provided free of charge (no copay) for all Medicare recipients. The clinical pharmacists at [facility] have begun to conduct these Wellness Visits which will also include a thorough review of all your medications.    As you primary medical provider recommend that you make an appointment for your Annual Wellness Visit if you have not done so already this year.  You may set up this appointment before you leave today or you may call back [phone] and schedule an appointment.  Please make sure when you call that you mention that you are scheduling your Annual Wellness Visit with the clinical pharmacist so that the appointment may be made for the proper length of time.    Continue current medications. Continue good therapeutic lifestyle changes which include good diet and exercise. Fall precautions discussed with patient. If an FOBT was given today- please return it to our front desk. If you are over 13 years old - you may need Prevnar 43 or the adult Pneumonia vaccine.  **Flu shots are available--- please call and schedule a FLU-CLINIC appointment**  After your visit with [name] today you will receive a survey in the mail or online from [employer] regarding your care with [name]. Please take a moment to fill this out. Your feedback is very  important to [name] as you can help [name] better understand your patient needs as well as improve your experience and satisfaction. WE CARE ABOUT YOU!!!   We will arrange for the patient to see a neurologist because of the lower extremity weakness and the generalized weakness. We will further investigate the information supporting the letter that he received from the hospital saying that during his bypass surgery that the refrigerator had contaminants in it?? The patient should make sure that he gets his flu shot October 1 He should continue to be careful not put himself at risk for falling

## 2015-11-30 NOTE — Telephone Encounter (Signed)
I talk with Dr. Laurance Flatten. This patient has had issues with progressive leg weakness, and an occasional fall. We will try to get him worked up to determine the etiology of these new symptoms.

## 2015-12-01 ENCOUNTER — Encounter: Payer: Self-pay | Admitting: Family Medicine

## 2015-12-01 DIAGNOSIS — I700 Atherosclerosis of aorta: Secondary | ICD-10-CM | POA: Insufficient documentation

## 2015-12-01 DIAGNOSIS — M5136 Other intervertebral disc degeneration, lumbar region: Secondary | ICD-10-CM | POA: Insufficient documentation

## 2015-12-01 LAB — HEPATIC FUNCTION PANEL
ALK PHOS: 67 IU/L (ref 39–117)
ALT: 19 IU/L (ref 0–44)
AST: 27 IU/L (ref 0–40)
Albumin: 4.4 g/dL (ref 3.5–4.7)
Bilirubin Total: 0.6 mg/dL (ref 0.0–1.2)
Bilirubin, Direct: 0.19 mg/dL (ref 0.00–0.40)
Total Protein: 6.4 g/dL (ref 6.0–8.5)

## 2015-12-01 LAB — CBC WITH DIFFERENTIAL/PLATELET
BASOS ABS: 0 10*3/uL (ref 0.0–0.2)
Basos: 1 %
EOS (ABSOLUTE): 0.1 10*3/uL (ref 0.0–0.4)
Eos: 1 %
Hematocrit: 37.5 % (ref 37.5–51.0)
Hemoglobin: 12.7 g/dL (ref 12.6–17.7)
IMMATURE GRANS (ABS): 0 10*3/uL (ref 0.0–0.1)
IMMATURE GRANULOCYTES: 0 %
LYMPHS: 28 %
Lymphocytes Absolute: 1.9 10*3/uL (ref 0.7–3.1)
MCH: 33.3 pg — ABNORMAL HIGH (ref 26.6–33.0)
MCHC: 33.9 g/dL (ref 31.5–35.7)
MCV: 98 fL — ABNORMAL HIGH (ref 79–97)
Monocytes Absolute: 0.6 10*3/uL (ref 0.1–0.9)
Monocytes: 9 %
NEUTROS PCT: 61 %
Neutrophils Absolute: 4.2 10*3/uL (ref 1.4–7.0)
Platelets: 223 10*3/uL (ref 150–379)
RBC: 3.81 x10E6/uL — AB (ref 4.14–5.80)
RDW: 13.5 % (ref 12.3–15.4)
WBC: 6.8 10*3/uL (ref 3.4–10.8)

## 2015-12-01 LAB — LIPID PANEL
Chol/HDL Ratio: 2.4 ratio units (ref 0.0–5.0)
Cholesterol, Total: 147 mg/dL (ref 100–199)
HDL: 61 mg/dL (ref >39)
LDL Calculated: 75 mg/dL (ref 0–99)
TRIGLYCERIDES: 57 mg/dL (ref 0–149)
VLDL Cholesterol Cal: 11 mg/dL (ref 5–40)

## 2015-12-01 LAB — SEDIMENTATION RATE: Sed Rate: 2 mm/hr (ref 0–30)

## 2015-12-01 LAB — BMP8+EGFR
BUN / CREAT RATIO: 15 (ref 10–24)
BUN: 17 mg/dL (ref 8–27)
CALCIUM: 9.1 mg/dL (ref 8.6–10.2)
CHLORIDE: 103 mmol/L (ref 96–106)
CO2: 24 mmol/L (ref 18–29)
Creatinine, Ser: 1.14 mg/dL (ref 0.76–1.27)
GFR calc Af Amer: 67 mL/min/{1.73_m2} (ref >59)
GFR calc non Af Amer: 58 mL/min/{1.73_m2} — ABNORMAL LOW (ref >59)
GLUCOSE: 111 mg/dL — AB (ref 65–99)
POTASSIUM: 4.2 mmol/L (ref 3.5–5.2)
Sodium: 141 mmol/L (ref 134–144)

## 2015-12-01 LAB — VITAMIN B12: VITAMIN B 12: 762 pg/mL (ref 211–946)

## 2015-12-01 LAB — THYROID PANEL WITH TSH
Free Thyroxine Index: 1.6 (ref 1.2–4.9)
T3 UPTAKE RATIO: 26 % (ref 24–39)
T4 TOTAL: 6 ug/dL (ref 4.5–12.0)
TSH: 1.61 u[IU]/mL (ref 0.450–4.500)

## 2015-12-01 LAB — VITAMIN D 25 HYDROXY (VIT D DEFICIENCY, FRACTURES): Vit D, 25-Hydroxy: 48.5 ng/mL (ref 30.0–100.0)

## 2015-12-05 NOTE — Telephone Encounter (Signed)
Called and spoke to pt's wife. Work-in appt scheduled for tomorrow morning. She agreed to have pt arrive 15 min prior to appt time. Says she will call back if anything changes.

## 2015-12-06 ENCOUNTER — Encounter: Payer: Self-pay | Admitting: Neurology

## 2015-12-06 ENCOUNTER — Ambulatory Visit (INDEPENDENT_AMBULATORY_CARE_PROVIDER_SITE_OTHER): Payer: MEDICARE | Admitting: Neurology

## 2015-12-06 VITALS — BP 189/73 | HR 62 | Resp 16 | Ht 72.0 in | Wt 180.0 lb

## 2015-12-06 DIAGNOSIS — R269 Unspecified abnormalities of gait and mobility: Secondary | ICD-10-CM | POA: Diagnosis not present

## 2015-12-06 DIAGNOSIS — I208 Other forms of angina pectoris: Secondary | ICD-10-CM | POA: Diagnosis not present

## 2015-12-06 NOTE — Progress Notes (Signed)
Reason for visit: Gait disturbance  Referring physician: Dr. Melanee Spry is a 82 y.o. male  History of present illness:  [name] is an 82 year old left-handed white male with a history of a gait disorder that he claims began about 2 months ago. The patient has had a recent fall within the last 2 weeks, he apparently bumped his head with this and he is not sure whether or not he lost consciousness with the fall. The patient has had some feeling of being cloudy headed for about a day after the fall. He denies any headache at this point. He indicates that the problems with walking may be worse when he is walking in dim light. When he touches a table or chair the balance improves. He denies any significant numbness of the extremities. He does report some sensation of feeling weak all over, particularly with the legs. He denies any issues controlling the bowels or the bladder. He denies headache, he reports a mild change in memory over the last several years. He indicates he did fall in October 2016, he had a workup for this that apparently was unremarkable. He denies neck pain or back pain or pain down arms or legs. He is sent to this office for an evaluation. The patient reports that when he goes downstairs he may tend to slap the left foot with walking.  Past Medical History:  Diagnosis Date  . Carcinoma of prostate ([place])   . CKD (chronic kidney disease) stage 2, GFR 60-89 ml/min   . Coronary artery disease    a. LHC (8/14):  3v CAD => s/p CABG (LIMA-LAD, SVG-OM1, SVG-PDA)  . GERD (gastroesophageal reflux disease)   . Gout   . HTN (hypertension)   . Hx of echocardiogram    Echocardiogram 11/13/12: EF 55-60%, normal wall motion, biatrial enlargement, mild AI, mild TR, no pericardial effusion  . Sleep apnea    Questionable    Past Surgical History:  Procedure Laterality Date  . CARDIAC CATHETERIZATION  11/07/2012   Dr Acie Fredrickson  . CORONARY ARTERY BYPASS GRAFT N/A 11/11/2012     Procedure: CORONARY ARTERY BYPASS GRAFTING times four using Right Greater Saphenous Vein Graft harvested endoscopically and Left Internal Mammary Artery.;  Surgeon: Ivin Poot, MD;  Location: [facility];  Service: Open Heart Surgery;  Laterality: N/A;  . INTRAOPERATIVE TRANSESOPHAGEAL ECHOCARDIOGRAM N/A 11/11/2012   Procedure: INTRAOPERATIVE TRANSESOPHAGEAL ECHOCARDIOGRAM;  Surgeon: Ivin Poot, MD;  Location: [facility];  Service: Open Heart Surgery;  Laterality: N/A;  . LEFT HEART CATHETERIZATION WITH CORONARY ANGIOGRAM N/A 11/07/2012   Procedure: LEFT HEART CATHETERIZATION WITH CORONARY ANGIOGRAM;  Surgeon: Thayer Headings, MD;  Location: [facility] CATH LAB;  Service: Cardiovascular;  Laterality: N/A;  . LYMPH NODE DISSECTION     Bilateral pelvic  . RETROPUBIC PROSTATECTOMY     Radical    Family History  Problem Relation Age of Onset  . Aneurysm Father     Cerebral  . Coronary artery disease Neg Hx     Social history:  reports that he has never smoked. He has never used smokeless tobacco. He reports that he drinks alcohol. He reports that he does not use drugs.  Medications:  Prior to Admission medications   Medication Sig Start Date End Date Taking? Authorizing Provider  aspirin EC 325 MG EC tablet Take 1 tablet (325 mg total) by mouth daily. 11/17/12  Yes Coolidge Breeze, PA-C  cholecalciferol (VITAMIN D) 1000 UNITS tablet Take  2,000 Units by mouth daily.   Yes Historical Provider, MD  Cyanocobalamin (B-12 COMPLIANCE INJECTION) 1000 MCG/ML KIT Inject as directed every 30 (thirty) days.   Yes Historical Provider, MD  FeFum-FePoly-FA-B Cmp-C-Biot (INTEGRA PLUS) CAPS Take 1 capsule by mouth daily. 08/12/15  Yes Chipper Herb, MD  fluticasone Asencion Islam) 50 MCG/ACT nasal spray One to 2 sprays each nostril at bedtime 10/12/14  Yes Chipper Herb, MD  furosemide (LASIX) 40 MG tablet Take 1 tablet (40 mg total) by mouth daily. 02/17/15  Yes Chipper Herb, MD  metoprolol succinate (TOPROL-XL) 25 MG 24  hr tablet Take 1 tablet (25 mg total) by mouth daily. 09/28/15  Yes Minus Breeding, MD  nitroGLYCERIN (NITROSTAT) 0.4 MG SL tablet Place 1 tablet (0.4 mg total) under the tongue every 5 (five) minutes as needed for chest pain. 07/20/15  Yes Luke K Kilroy, PA-C  predniSONE (DELTASONE) 10 MG tablet Take 1 tab QID x 2 days, then 1 tab TID x 2 days, then 1 tab BID x 2 days, then 1 tab QD x 2 days 08/03/15  Yes Chipper Herb, MD  triamcinolone cream (KENALOG) 0.1 % Apply 1 application topically 2 (two) times daily. 05/06/13  Yes Chipper Herb, MD      Allergies  Allergen Reactions  . Crestor [Rosuvastatin]     GYNECOMASTIA AND SWELLING    ROS:  Out of a complete 14 system review of symptoms, the patient complains only of the following symptoms, and all other reviewed systems are negative.  Swelling in the legs Easy bruising  Blood pressure (!) 189/73, pulse 62, resp. rate 16, height 6' (1.829 m), weight 180 lb (81.6 kg).   Blood pressure, right arm, sitting is 184/82. Blood pressure, right arm, standing is 158/78.  Physical Exam  General: The patient is alert and cooperative at the time of the examination.  Eyes: Pupils are equal, round, and reactive to light. Discs are flat bilaterally.  Neck: The neck is supple, no carotid bruits are noted.  Respiratory: The respiratory examination is clear.  Cardiovascular: The cardiovascular examination reveals a regular rate and rhythm, no obvious murmurs or rubs are noted.  Skin: Extremities are without significant edema.  Neurologic Exam  Mental status: The patient is alert and oriented x 3 at the time of the examination. The patient has apparent normal recent and remote memory, with an apparently normal attention span and concentration ability.  Cranial nerves: Facial symmetry is present. There is good sensation of the face to pinprick and soft touch bilaterally. The strength of the facial muscles and the muscles to head turning and shoulder  shrug are normal bilaterally. Speech is well enunciated, no aphasia or dysarthria is noted. Extraocular movements are full. Visual fields are full. The tongue is midline, and the patient has symmetric elevation of the soft palate. No obvious hearing deficits are noted.  Motor: The motor testing reveals 5 over 5 strength of all 4 extremities. Good symmetric motor tone is noted throughout.  Sensory: Sensory testing is intact to pinprick, soft touch, vibration sensation, and position sense on all 4 extremities, with exception of a stocking pattern pinprick sensory deficit in the distal third of the legs bilaterally. No evidence of extinction is noted.  Coordination: Cerebellar testing reveals good finger-nose-finger and heel-to-shin bilaterally.  Gait and station: Gait is slightly wide-based. The patient has instability with tandem gait. Romberg is negative, but is unsteady.  Reflexes: Deep tendon reflexes are symmetric and normal bilaterally, with exception  of some depression the left ankle jerk reflex. Toes are downgoing bilaterally.   Assessment/Plan:  1. Gait disturbance  The clinical examination does not show any true weakness of the legs. The patient will be sent for blood work today. He will have MRI of the brain. If this is unremarkable, we may consider EMG and nerve conduction study evaluation in the future. He will follow up in 2 to 3 months.  Jill Alexanders MD 12/06/2015 8:09 AM  [facility] [address] [place], [place] [address]  Phone [phone] Fax [phone]

## 2015-12-06 NOTE — Patient Instructions (Addendum)
Fall Prevention in the Home  Falls can cause injuries and can affect people from all age groups. There are many simple things that you can do to make your home safe and to help prevent falls. WHAT CAN I DO ON THE OUTSIDE OF MY HOME?  Regularly repair the edges of walkways and driveways and fix any cracks.  Remove high doorway thresholds.  Trim any shrubbery on the main path into your home.  Use bright outdoor lighting.  Clear walkways of debris and clutter, including tools and rocks.  Regularly check that handrails are securely fastened and in good repair. Both sides of any steps should have handrails.  Install guardrails along the edges of any raised decks or porches.  Have leaves, snow, and ice cleared regularly.  Use sand or salt on walkways during winter months.  In the garage, clean up any spills right away, including grease or oil spills. WHAT CAN I DO IN THE BATHROOM?  Use night lights.  Install grab bars by the toilet and in the tub and shower. Do not use towel bars as grab bars.  Use non-skid mats or decals on the floor of the tub or shower.  If you need to sit down while you are in the shower, use a plastic, non-slip stool..  Keep the floor dry. Immediately clean up any water that spills on the floor.  Remove soap buildup in the tub or shower on a regular basis.  Attach bath mats securely with double-sided non-slip rug tape.  Remove throw rugs and other tripping hazards from the floor. WHAT CAN I DO IN THE BEDROOM?  Use night lights.  Make sure that a bedside light is easy to reach.  Do not use oversized bedding that drapes onto the floor.  Have a firm chair that has side arms to use for getting dressed.  Remove throw rugs and other tripping hazards from the floor. WHAT CAN I DO IN THE KITCHEN?   Clean up any spills right away.  Avoid walking on wet floors.  Place frequently used items in easy-to-reach places.  If you need to reach for something  above you, use a sturdy step stool that has a grab bar.  Keep electrical cables out of the way.  Do not use floor polish or wax that makes floors slippery. If you have to use wax, make sure that it is non-skid floor wax.  Remove throw rugs and other tripping hazards from the floor. WHAT CAN I DO IN THE STAIRWAYS?  Do not leave any items on the stairs.  Make sure that there are handrails on both sides of the stairs. Fix handrails that are broken or loose. Make sure that handrails are as long as the stairways.  Check any carpeting to make sure that it is firmly attached to the stairs. Fix any carpet that is loose or worn.  Avoid having throw rugs at the top or bottom of stairways, or secure the rugs with carpet tape to prevent them from moving.  Make sure that you have a light switch at the top of the stairs and the bottom of the stairs. If you do not have them, have them installed. WHAT ARE SOME OTHER FALL PREVENTION TIPS?  Wear closed-toe shoes that fit well and support your feet. Wear shoes that have rubber soles or low heels.  When you use a stepladder, make sure that it is completely opened and that the sides are firmly locked. Have someone hold the ladder while you   are using it. Do not climb a closed stepladder.  Add color or contrast paint or tape to grab bars and handrails in your home. Place contrasting color strips on the first and last steps.  Use mobility aids as needed, such as canes, walkers, scooters, and crutches.  Turn on lights if it is dark. Replace any light bulbs that burn out.  Set up furniture so that there are clear paths. Keep the furniture in the same spot.  Fix any uneven floor surfaces.  Choose a carpet design that does not hide the edge of steps of a stairway.  Be aware of any and all pets.  Review your medicines with your healthcare provider. Some medicines can cause dizziness or changes in blood pressure, which increase your risk of falling. Talk  with your health care provider about other ways that you can decrease your risk of falls. This may include working with a physical therapist or trainer to improve your strength, balance, and endurance.   This information is not intended to replace advice given to you by your health care provider. Make sure you discuss any questions you have with your health care provider.   Document Released: 02/23/2002 Document Revised: 07/20/2014 Document Reviewed: 04/09/2014 Elsevier Interactive Patient Education 2016 [employer].  

## 2015-12-08 ENCOUNTER — Telehealth: Payer: Self-pay | Admitting: Neurology

## 2015-12-08 LAB — CK: CK TOTAL: 279 U/L — AB (ref 24–204)

## 2015-12-08 LAB — ACETYLCHOLINE RECEPTOR, BINDING

## 2015-12-08 LAB — COPPER, SERUM: Copper: 80 ug/dL (ref 72–166)

## 2015-12-08 LAB — RPR: RPR: NONREACTIVE

## 2015-12-08 LAB — SEDIMENTATION RATE: Sed Rate: 3 mm/hr (ref 0–30)

## 2015-12-08 NOTE — Telephone Encounter (Signed)
I called the patient, talk with the wife. The blood work is unremarkable exception of a slight elevation the CK enzyme level. The patient is not on any statin medications. We are going to check MRI of the brain looking for an etiology of his walking problem, but if this is not revealing, we will check EMG and nerve conduction study to look at nerve and muscle function.

## 2015-12-15 ENCOUNTER — Ambulatory Visit
Admission: RE | Admit: 2015-12-15 | Discharge: 2015-12-15 | Disposition: A | Discharge status: Home / Self Care | Payer: MEDICARE | Source: Ambulatory Visit | Attending: Neurology | Admitting: Neurology

## 2015-12-15 DIAGNOSIS — R269 Unspecified abnormalities of gait and mobility: Secondary | ICD-10-CM | POA: Diagnosis not present

## 2015-12-16 ENCOUNTER — Telehealth: Payer: Self-pay | Admitting: Neurology

## 2015-12-16 DIAGNOSIS — R269 Unspecified abnormalities of gait and mobility: Secondary | ICD-10-CM

## 2015-12-16 NOTE — Telephone Encounter (Signed)
I called patient. MRI the brain shows minimal white matter changes, nothing that should impact balance. The patient will be set up for EMG nerve conduction study evaluation. CK enzyme levels were slightly elevated.  We will do nerve conduction studies on both legs, EMG on one leg.   MRI brain 12/15/15:  IMPRESSION:  This MRI of the brain without contrast shows the following: 1.    A few scattered T2/FLAIR hyperintense foci in the pons and in the hemispheres consistent with mild chronic microvascular ischemic change.   The extent is typical for age. 2.    Moderate cortical atrophy 3.    There are no acute findings.

## 2015-12-28 ENCOUNTER — Ambulatory Visit (INDEPENDENT_AMBULATORY_CARE_PROVIDER_SITE_OTHER): Payer: MEDICARE | Admitting: Physician Assistant

## 2015-12-28 ENCOUNTER — Encounter: Payer: Self-pay | Admitting: Physician Assistant

## 2015-12-28 VITALS — BP 143/70 | HR 72 | Temp 97.6°F | Ht 72.0 in | Wt 182.0 lb

## 2015-12-28 DIAGNOSIS — S93402A Sprain of unspecified ligament of left ankle, initial encounter: Secondary | ICD-10-CM

## 2015-12-28 DIAGNOSIS — I208 Other forms of angina pectoris: Secondary | ICD-10-CM | POA: Diagnosis not present

## 2015-12-28 DIAGNOSIS — L03818 Cellulitis of other sites: Secondary | ICD-10-CM

## 2015-12-28 MED ORDER — CEPHALEXIN 500 MG PO CAPS: 500.0000 mg | ORAL_CAPSULE | Freq: Four times a day (QID) | ORAL | 0 refills | Status: DC

## 2015-12-28 NOTE — Patient Instructions (Signed)

## 2015-12-29 ENCOUNTER — Ambulatory Visit: Payer: MEDICARE | Admitting: Rehabilitation

## 2015-12-29 ENCOUNTER — Other Ambulatory Visit (INDEPENDENT_AMBULATORY_CARE_PROVIDER_SITE_OTHER): Payer: MEDICARE

## 2015-12-29 DIAGNOSIS — L03116 Cellulitis of left lower limb: Secondary | ICD-10-CM | POA: Diagnosis not present

## 2015-12-29 DIAGNOSIS — S93402A Sprain of unspecified ligament of left ankle, initial encounter: Secondary | ICD-10-CM

## 2015-12-29 DIAGNOSIS — M109 Gout, unspecified: Secondary | ICD-10-CM | POA: Diagnosis not present

## 2015-12-29 DIAGNOSIS — L03122 Acute lymphangitis of left axilla: Secondary | ICD-10-CM | POA: Diagnosis not present

## 2015-12-29 DIAGNOSIS — M79672 Pain in left foot: Secondary | ICD-10-CM | POA: Diagnosis not present

## 2015-12-30 ENCOUNTER — Telehealth: Payer: Self-pay

## 2015-12-30 NOTE — Telephone Encounter (Signed)
-----   Message from Terald Sleeper, PA-C sent at 12/29/2015  4:45 PM EDT ----- Nothing is fractured, but with his continued pain and worsening swelling, recommend follow up with podiatry to assure full healing.

## 2015-12-30 NOTE — Telephone Encounter (Signed)
Patient is aware of recommendation.  He said he went to the podiatrist yesterday who told him it was a blood vessel and would need time to heal.

## 2016-01-02 ENCOUNTER — Ambulatory Visit (INDEPENDENT_AMBULATORY_CARE_PROVIDER_SITE_OTHER): Payer: MEDICARE | Admitting: *Deleted

## 2016-01-02 DIAGNOSIS — E538 Deficiency of other specified B group vitamins: Secondary | ICD-10-CM | POA: Diagnosis not present

## 2016-01-02 DIAGNOSIS — Z23 Encounter for immunization: Secondary | ICD-10-CM | POA: Diagnosis not present

## 2016-01-02 NOTE — Progress Notes (Signed)
Pt given Vit B12inj and influenza vaccine Pt tolerated well 

## 2016-01-02 NOTE — Progress Notes (Signed)
BP (!) 143/70   Pulse 72   Temp 97.6 F (36.4 C) (Oral)   Ht 6' (1.829 m)   Wt 182 lb (82.6 kg)   BMI 24.68 kg/m    Subjective:    Patient ID: Jon Reid, male    DOB: 1928-07-31, 82 y.o.   MRN: [id number]  HPI: Jon Reid is a 82 y.o. male presenting on 12/28/2015 for Left foot swelling and painful  Several week ago he twisted his left ankle and had pain in the midfoot mostly on the lateral portion. Was able weight bear but with pain. Used OTC meds for pain.  Walked at a car show and had increase in the swelling and pain. In last few days much more swelling in the mid and lateral foot.    Relevant past medical, surgical, family and social history reviewed and updated as indicated. Allergies and medications reviewed and updated.  Past Medical History:  Diagnosis Date  . Carcinoma of prostate ([place])   . CKD (chronic kidney disease) stage 2, GFR 60-89 ml/min   . Coronary artery disease    a. LHC (8/14):  3v CAD => s/p CABG (LIMA-LAD, SVG-OM1, SVG-PDA)  . GERD (gastroesophageal reflux disease)   . Gout   . HTN (hypertension)   . Hx of echocardiogram    Echocardiogram 11/13/12: EF 55-60%, normal wall motion, biatrial enlargement, mild AI, mild TR, no pericardial effusion  . Sleep apnea    Questionable    Past Surgical History:  Procedure Laterality Date  . CARDIAC CATHETERIZATION  11/07/2012   Dr Acie Fredrickson  . CORONARY ARTERY BYPASS GRAFT N/A 11/11/2012   Procedure: CORONARY ARTERY BYPASS GRAFTING times four using Right Greater Saphenous Vein Graft harvested endoscopically and Left Internal Mammary Artery.;  Surgeon: Ivin Poot, MD;  Location: [facility];  Service: Open Heart Surgery;  Laterality: N/A;  . INTRAOPERATIVE TRANSESOPHAGEAL ECHOCARDIOGRAM N/A 11/11/2012   Procedure: INTRAOPERATIVE TRANSESOPHAGEAL ECHOCARDIOGRAM;  Surgeon: Ivin Poot, MD;  Location: [facility];  Service: Open Heart Surgery;  Laterality: N/A;  . LEFT HEART CATHETERIZATION WITH CORONARY ANGIOGRAM N/A  11/07/2012   Procedure: LEFT HEART CATHETERIZATION WITH CORONARY ANGIOGRAM;  Surgeon: Thayer Headings, MD;  Location: [facility] CATH LAB;  Service: Cardiovascular;  Laterality: N/A;  . LYMPH NODE DISSECTION     Bilateral pelvic  . RETROPUBIC PROSTATECTOMY     Radical    Review of Systems  Constitutional: Negative.  Negative for appetite change and fatigue.  Eyes: Negative for pain and visual disturbance.  Respiratory: Negative.  Negative for cough, chest tightness, shortness of breath and wheezing.   Cardiovascular: Negative.  Negative for chest pain, palpitations and leg swelling.  Gastrointestinal: Negative.  Negative for abdominal pain, diarrhea, nausea and vomiting.  Genitourinary: Negative.   Musculoskeletal: Positive for arthralgias and myalgias.  Skin: Positive for color change and rash. Negative for wound.  Neurological: Negative.  Negative for weakness, numbness and headaches.  Psychiatric/Behavioral: Negative.       Medication List       Accurate as of 12/28/15 11:59 PM. Always use your most recent med list.          aspirin 325 MG EC tablet Take 1 tablet (325 mg total) by mouth daily.   B-12 COMPLIANCE INJECTION 1000 MCG/ML Kit Generic drug:  Cyanocobalamin Inject as directed every 30 (thirty) days.   cephALEXin 500 MG capsule Commonly known as:  KEFLEX Take 1 capsule (500 mg total) by mouth 4 (four) times daily.  cholecalciferol 1000 units tablet Commonly known as:  VITAMIN D Take 2,000 Units by mouth daily.   fluticasone 50 MCG/ACT nasal spray Commonly known as:  FLONASE One to 2 sprays each nostril at bedtime   furosemide 40 MG tablet Commonly known as:  LASIX Take 1 tablet (40 mg total) by mouth daily.   INTEGRA PLUS Caps Take 1 capsule by mouth daily.   metoprolol succinate 25 MG 24 hr tablet Commonly known as:  TOPROL-XL Take 1 tablet (25 mg total) by mouth daily.   nitroGLYCERIN 0.4 MG SL tablet Commonly known as:  NITROSTAT Place 1 tablet (0.4  mg total) under the tongue every 5 (five) minutes as needed for chest pain.   triamcinolone cream 0.1 % Commonly known as:  KENALOG Apply 1 application topically 2 (two) times daily.          Objective:    BP (!) 143/70   Pulse 72   Temp 97.6 F (36.4 C) (Oral)   Ht 6' (1.829 m)   Wt 182 lb (82.6 kg)   BMI 24.68 kg/m   Allergies  Allergen Reactions  . Crestor [Rosuvastatin]     GYNECOMASTIA AND SWELLING    Physical Exam  Constitutional: He appears well-developed and well-nourished. No distress.  HENT:  Head: Normocephalic and atraumatic.  Eyes: Conjunctivae and EOM are normal. Pupils are equal, round, and reactive to light.  Cardiovascular: Normal rate, regular rhythm and normal heart sounds.   Pulmonary/Chest: Effort normal and breath sounds normal. No respiratory distress.  Musculoskeletal:       Left foot: There is decreased range of motion, bony tenderness and swelling. There is normal capillary refill, no crepitus and no deformity.       Feet:  Skin: Skin is warm and dry.  Psychiatric: He has a normal mood and affect. His behavior is normal.  Nursing note and vitals reviewed.     Assessment & Plan:   1. Sprain of left ankle, unspecified ligament, initial encounter - DG Foot Complete Left; Future  2. Cellulitis of other specified site - cephALEXin (KEFLEX) 500 MG capsule; Take 1 capsule (500 mg total) by mouth 4 (four) times daily.  Dispense: 40 capsule; Refill: 0   Continue all other maintenance medications as listed above.  Follow up plan: Return if symptoms worsen or fail to improve.  Orders Placed This Encounter  Procedures  . DG Foot Complete Left    Educational handout given for cellulitis  Terald Sleeper PA-C [facility] [address]  [place], [place] [address] [phone]   01/02/2016, 3:20 AM

## 2016-01-05 DIAGNOSIS — L03122 Acute lymphangitis of left axilla: Secondary | ICD-10-CM | POA: Diagnosis not present

## 2016-01-23 ENCOUNTER — Encounter: Payer: Self-pay | Admitting: Neurology

## 2016-01-23 ENCOUNTER — Ambulatory Visit (INDEPENDENT_AMBULATORY_CARE_PROVIDER_SITE_OTHER): Payer: MEDICARE | Admitting: Neurology

## 2016-01-23 ENCOUNTER — Ambulatory Visit (INDEPENDENT_AMBULATORY_CARE_PROVIDER_SITE_OTHER): Payer: Self-pay | Admitting: Neurology

## 2016-01-23 DIAGNOSIS — R269 Unspecified abnormalities of gait and mobility: Secondary | ICD-10-CM

## 2016-01-23 DIAGNOSIS — R2689 Other abnormalities of gait and mobility: Secondary | ICD-10-CM | POA: Diagnosis not present

## 2016-01-23 NOTE — Progress Notes (Signed)
The patient comes in for EMG and nerve conduction study evaluation. The nerve conduction studies show evidence of lower motor amplitudes with sensory sparing in the legs, consistent with a primarily motor neuropathy. EMG evaluation shows acute and chronic denervation below the knee, some mild denervation of the lumbosacral paraspinal muscles.  Given these findings, the possibility of an overlying L5 and S1 radiculopathy needs to be considered, the possibility of spinal stenosis should be considered.  MRI evaluation of the low back will be done.

## 2016-01-23 NOTE — Progress Notes (Signed)
Please refer to EMG and nerve conduction study evaluation. 

## 2016-01-23 NOTE — Procedures (Signed)
     HISTORY:  Jon Reid is an 82 year old patient with a history of balance changes, difficulty with walking, he feels as if his left foot will slap the ground when he walks. He denies any back pain or pain down the legs. He is being evaluated for a possible neuropathy or a lumbosacral radiculopathy.  NERVE CONDUCTION STUDIES:  Nerve conduction studies were performed on both upper extremities. The distal motor latencies and motor amplitudes for the median and ulnar nerves were within normal limits. The F wave latencies and nerve conduction velocities for these nerves were also normal. The sensory latencies for the median and ulnar nerves were normal.  Nerve conduction studies were performed on both lower extremities. The distal motor latencies for the peroneal and posterior tibial nerves were normal bilaterally, with low motor amplitudes for these nerves bilaterally. The nerve conduction velocities for these nerves were within normal limits bilaterally, and the H reflex latencies were absent bilaterally. The peroneal sensory latencies were symmetric and normal.  EMG STUDIES:  EMG study was performed on the left lower extremity:  The tibialis anterior muscle reveals 2 to 5K motor units with decreased recruitment. 2+ fibrillations and positive waves were seen. The peroneus tertius muscle reveals 2 to 5K motor units with decreased recruitment. 2+ fibrillations and positive waves were seen. The medial gastrocnemius muscle reveals 1 to 3K motor units with decreased recruitment. 1+ fibrillations and positive waves were seen. Decreased insertional activity were seen. The vastus lateralis muscle reveals 2 to 4K motor units with full recruitment. No fibrillations or positive waves were seen. The iliopsoas muscle reveals 2 to 4K motor units with full recruitment. No fibrillations or positive waves were seen. The biceps femoris muscle (long head) reveals 2 to 4K motor units with full recruitment. No  fibrillations or positive waves were seen. The lumbosacral paraspinal muscles were tested at 3 levels, and revealed no abnormalities of insertional activity at the lower level tested. 1+ positive waves were seen at the upper and middle levels. There was good relaxation.   IMPRESSION:  Nerve conduction studies done on the left upper extremity and both lower extremities shows evidence of a primarily motor neuropathy with sensory sparing involving the lower extremities. EMG evaluation of the left lower extremity shows acute and chronic denervation below the knee, mild denervation in the lumbosacral paraspinal muscles. The possibility of an overlying L5 and S1 radiculopathy should be considered.  Jon Alexanders MD 01/23/2016 3:56 PM  [facility] [address] [place], [place] [address]  Phone [phone] Fax [phone]

## 2016-02-03 ENCOUNTER — Ambulatory Visit (INDEPENDENT_AMBULATORY_CARE_PROVIDER_SITE_OTHER): Payer: MEDICARE | Admitting: *Deleted

## 2016-02-03 ENCOUNTER — Other Ambulatory Visit: Payer: Self-pay | Admitting: Cardiology

## 2016-02-03 DIAGNOSIS — E538 Deficiency of other specified B group vitamins: Secondary | ICD-10-CM | POA: Diagnosis not present

## 2016-02-03 NOTE — Telephone Encounter (Signed)
Rx(s) sent to pharmacy electronically.  

## 2016-02-03 NOTE — Progress Notes (Signed)
Pt given Vit B12 inj Tolerated well 

## 2016-02-15 ENCOUNTER — Ambulatory Visit
Admission: RE | Admit: 2016-02-15 | Discharge: 2016-02-15 | Disposition: A | Discharge status: Home / Self Care | Payer: MEDICARE | Source: Ambulatory Visit | Attending: Neurology | Admitting: Neurology

## 2016-02-15 DIAGNOSIS — M5126 Other intervertebral disc displacement, lumbar region: Secondary | ICD-10-CM | POA: Diagnosis not present

## 2016-02-15 DIAGNOSIS — R269 Unspecified abnormalities of gait and mobility: Secondary | ICD-10-CM

## 2016-02-17 ENCOUNTER — Telehealth: Payer: Self-pay | Admitting: Neurology

## 2016-02-17 NOTE — Telephone Encounter (Signed)
I called patient. MRI the back shows spinal stenosis at 2 levels, multilevel neuroforaminal stenosis bilaterally. I am amazed that this patient is not having significant back pain and pain down the legs. This likely explains the weakness in the legs, EMG findings, and walking problems. At age 82, the patient is not the best surgical candidate, as long as he is coping with the walking issues, would not recommend a surgical referral.   MRI lumbar 02/16/16:  IMPRESSION:  Abnormal MRI lumbar spine (without) demonstrating: 1. At L3-4: disc bulging, facet hypertrophy, ligamentum flavum hypertrophy, with moderate-severe spinal stenosis and severe  biforaminal stenosis. 2. At L4-5: disc bulging, facet hypertrophy, with moderate spinal stenosis and severe right and moderate left foraminal stenosis; apparent right laminectomy changes vs severe facet and posterior element arthropathy noted. 3. At L5-S1: disc bulging and facet hypertrophy with severe biforaminal stenosis. 4. At L2-3: disc bulging and facet hypertrophy with moderate biforaminal stenosis. 5. Additional degenerative changes as above.

## 2016-03-06 ENCOUNTER — Encounter: Payer: Self-pay | Admitting: Neurology

## 2016-03-06 ENCOUNTER — Ambulatory Visit (INDEPENDENT_AMBULATORY_CARE_PROVIDER_SITE_OTHER): Payer: MEDICARE | Admitting: Neurology

## 2016-03-06 VITALS — BP 159/72 | HR 54 | Ht 72.0 in | Wt 177.0 lb

## 2016-03-06 DIAGNOSIS — I208 Other forms of angina pectoris: Secondary | ICD-10-CM

## 2016-03-06 DIAGNOSIS — R269 Unspecified abnormalities of gait and mobility: Secondary | ICD-10-CM | POA: Diagnosis not present

## 2016-03-06 NOTE — Patient Instructions (Addendum)
Fall Prevention in the Home Falls can cause injuries and can affect people from all age groups. There are many simple things that you can do to make your home safe and to help prevent falls. What can I do on the outside of my home?  Regularly repair the edges of walkways and driveways and fix any cracks.  Remove high doorway thresholds.  Trim any shrubbery on the main path into your home.  Use bright outdoor lighting.  Clear walkways of debris and clutter, including tools and rocks.  Regularly check that handrails are securely fastened and in good repair. Both sides of any steps should have handrails.  Install guardrails along the edges of any raised decks or porches.  Have leaves, snow, and ice cleared regularly.  Use sand or salt on walkways during winter months.  In the garage, clean up any spills right away, including grease or oil spills. What can I do in the bathroom?  Use night lights.  Install grab bars by the toilet and in the tub and shower. Do not use towel bars as grab bars.  Use non-skid mats or decals on the floor of the tub or shower.  If you need to sit down while you are in the shower, use a plastic, non-slip stool.  Keep the floor dry. Immediately clean up any water that spills on the floor.  Remove soap buildup in the tub or shower on a regular basis.  Attach bath mats securely with double-sided non-slip rug tape.  Remove throw rugs and other tripping hazards from the floor. What can I do in the bedroom?  Use night lights.  Make sure that a bedside light is easy to reach.  Do not use oversized bedding that drapes onto the floor.  Have a firm chair that has side arms to use for getting dressed.  Remove throw rugs and other tripping hazards from the floor. What can I do in the kitchen?  Clean up any spills right away.  Avoid walking on wet floors.  Place frequently used items in easy-to-reach places.  If you need to reach for something above  you, use a sturdy step stool that has a grab bar.  Keep electrical cables out of the way.  Do not use floor polish or wax that makes floors slippery. If you have to use wax, make sure that it is non-skid floor wax.  Remove throw rugs and other tripping hazards from the floor. What can I do in the stairways?  Do not leave any items on the stairs.  Make sure that there are handrails on both sides of the stairs. Fix handrails that are broken or loose. Make sure that handrails are as long as the stairways.  Check any carpeting to make sure that it is firmly attached to the stairs. Fix any carpet that is loose or worn.  Avoid having throw rugs at the top or bottom of stairways, or secure the rugs with carpet tape to prevent them from moving.  Make sure that you have a light switch at the top of the stairs and the bottom of the stairs. If you do not have them, have them installed. What are some other fall prevention tips?  Wear closed-toe shoes that fit well and support your feet. Wear shoes that have rubber soles or low heels.  When you use a stepladder, make sure that it is completely opened and that the sides are firmly locked. Have someone hold the ladder while you are using   it. Do not climb a closed stepladder.  Add color or contrast paint or tape to grab bars and handrails in your home. Place contrasting color strips on the first and last steps.  Use mobility aids as needed, such as canes, walkers, scooters, and crutches.  Turn on lights if it is dark. Replace any light bulbs that burn out.  Set up furniture so that there are clear paths. Keep the furniture in the same spot.  Fix any uneven floor surfaces.  Choose a carpet design that does not hide the edge of steps of a stairway.  Be aware of any and all pets.  Review your medicines with your healthcare provider. Some medicines can cause dizziness or changes in blood pressure, which increase your risk of falling. Talk with  your health care provider about other ways that you can decrease your risk of falls. This may include working with a physical therapist or trainer to improve your strength, balance, and endurance. This information is not intended to replace advice given to you by your health care provider. Make sure you discuss any questions you have with your health care provider. Document Released: 02/23/2002 Document Revised: 08/02/2015 Document Reviewed: 04/09/2014 Elsevier Interactive Patient Education  2017 [employer]  

## 2016-03-06 NOTE — Progress Notes (Signed)
Reason for visit: Gait disorder  Jon Reid is an 82 y.o. male  History of present illness:  Jon Reid is an 82 year old left-handed white male with a history of a gait disorder that has developed within the last year or so. The patient has felt somewhat staggery, he has fallen on one occasion when he was out in the yard blowing leaves. The patient has not had any falls since that time. The patient has undergone a workup that has included MRI evaluation of the brain that shows evidence of some pontine ischemia, no extensive white matter changes have been noted. The patient has had evidence of a primarily motor neuropathy on nerve conduction studies, a recent MRI of the lumbar spine was done showing evidence of spinal stenosis affecting the L3-4 level, and some degree at the L4-5 level. The patient reports no back discomfort or discomfort down the lower extremities. He denies any neck pain. He returns for an evaluation. He indicates that if he touches a table or chair, this helps his balance issues dramatically.  Past Medical History:  Diagnosis Date  . Carcinoma of prostate ([place])   . CKD (chronic kidney disease) stage 2, GFR 60-89 ml/min   . Coronary artery disease    a. LHC (8/14):  3v CAD => s/p CABG (LIMA-LAD, SVG-OM1, SVG-PDA)  . GERD (gastroesophageal reflux disease)   . Gout   . HTN (hypertension)   . Hx of echocardiogram    Echocardiogram 11/13/12: EF 55-60%, normal wall motion, biatrial enlargement, mild AI, mild TR, no pericardial effusion  . Sleep apnea    Questionable    Past Surgical History:  Procedure Laterality Date  . CARDIAC CATHETERIZATION  11/07/2012   Dr Acie Fredrickson  . CORONARY ARTERY BYPASS GRAFT N/A 11/11/2012   Procedure: CORONARY ARTERY BYPASS GRAFTING times four using Right Greater Saphenous Vein Graft harvested endoscopically and Left Internal Mammary Artery.;  Surgeon: Ivin Poot, MD;  Location: [place];  Service: Open Heart Surgery;  Laterality: N/A;    . INTRAOPERATIVE TRANSESOPHAGEAL ECHOCARDIOGRAM N/A 11/11/2012   Procedure: INTRAOPERATIVE TRANSESOPHAGEAL ECHOCARDIOGRAM;  Surgeon: Ivin Poot, MD;  Location: [facility];  Service: Open Heart Surgery;  Laterality: N/A;  . LEFT HEART CATHETERIZATION WITH CORONARY ANGIOGRAM N/A 11/07/2012   Procedure: LEFT HEART CATHETERIZATION WITH CORONARY ANGIOGRAM;  Surgeon: Thayer Headings, MD;  Location: [facility] CATH LAB;  Service: Cardiovascular;  Laterality: N/A;  . LYMPH NODE DISSECTION     Bilateral pelvic  . RETROPUBIC PROSTATECTOMY     Radical    Family History  Problem Relation Age of Onset  . Aneurysm Father     Cerebral  . Coronary artery disease Neg Hx     Social history:  reports that he has never smoked. He has never used smokeless tobacco. He reports that he drinks alcohol. He reports that he does not use drugs.    Allergies  Allergen Reactions  . Crestor [Rosuvastatin]     GYNECOMASTIA AND SWELLING    Medications:  Prior to Admission medications   Medication Sig Start Date End Date Taking? Authorizing Provider  aspirin EC 325 MG EC tablet Take 1 tablet (325 mg total) by mouth daily. 11/17/12  Yes Coolidge Breeze, PA-C  cholecalciferol (VITAMIN D) 1000 UNITS tablet Take 2,000 Units by mouth daily.   Yes Historical Provider, MD  Cyanocobalamin (B-12 COMPLIANCE INJECTION) 1000 MCG/ML KIT Inject as directed every 30 (thirty) days.   Yes Historical Provider, MD  FeFum-FePoly-FA-B Cmp-C-Biot (INTEGRA PLUS)  CAPS Take 1 capsule by mouth daily. 08/12/15  Yes Chipper Herb, MD  fluticasone Asencion Islam) 50 MCG/ACT nasal spray One to 2 sprays each nostril at bedtime 10/12/14  Yes Chipper Herb, MD  furosemide (LASIX) 40 MG tablet Take 1 tablet (40 mg total) by mouth daily. 02/17/15  Yes Chipper Herb, MD  metoprolol succinate (TOPROL-XL) 25 MG 24 hr tablet Take 1 tablet (25 mg total) by mouth daily. 02/03/16  Yes Minus Breeding, MD  nitroGLYCERIN (NITROSTAT) 0.4 MG SL tablet Place 1 tablet (0.4 mg  total) under the tongue every 5 (five) minutes as needed for chest pain. 07/20/15  Yes Erlene Quan, PA-C  triamcinolone cream (KENALOG) 0.1 % Apply 1 application topically 2 (two) times daily. 05/06/13  Yes Chipper Herb, MD    ROS:  Out of a complete 14 system review of symptoms, the patient complains only of the following symptoms, and all other reviewed systems are negative.  Walking difficulty Weakness  Blood pressure (!) 159/72, pulse (!) 54, height 6' (1.829 m), weight 177 lb (80.3 kg).  Physical Exam  General: The patient is alert and cooperative at the time of the examination.  Skin: No significant peripheral edema is noted.   Neurologic Exam  Mental status: The patient is alert and oriented x 3 at the time of the examination. The patient has apparent normal recent and remote memory, with an apparently normal attention span and concentration ability.   Cranial nerves: Facial symmetry is present. Speech is normal, no aphasia or dysarthria is noted. Extraocular movements are full. Visual fields are full.  Motor: The patient has good strength in all 4 extremities.  Sensory examination: Soft touch sensation is symmetric on the face, arms, and legs.  Coordination: The patient has good finger-nose-finger and heel-to-shin bilaterally.  Gait and station: The patient has a slightly wide-based gait, he may stagger slightly with turns. Tandem gait is slightly unsteady. Romberg is negative. No drift is seen.  Reflexes: Deep tendon reflexes are symmetric.   Assessment/Plan:  1. Gait instability  The patient likely has a multifactorial gait problem associated with some pontine ischemia, advanced age, and lumbosacral spinal stenosis. The patient does not have a severe gait disorder, but he does need to be careful with walking. I have recommended that he use a walking stick when he is doing long distance walking. I have recommended that he engage in exercises that works on balance,  such as Chief of Staff. The patient will follow-up in about 6 months, if he remains stable at that time I will see him on an as-needed basis.  Jill Alexanders MD 03/06/2016 10:55 AM  [facility] [address] [place], [place] [address]  Phone [phone] Fax [phone]

## 2016-03-07 ENCOUNTER — Ambulatory Visit (INDEPENDENT_AMBULATORY_CARE_PROVIDER_SITE_OTHER): Payer: MEDICARE | Admitting: *Deleted

## 2016-03-07 DIAGNOSIS — E538 Deficiency of other specified B group vitamins: Secondary | ICD-10-CM | POA: Diagnosis not present

## 2016-03-07 NOTE — Progress Notes (Signed)
Pt given Vit B12 inj Tolerated well 

## 2016-03-23 DIAGNOSIS — Q100 Congenital ptosis: Secondary | ICD-10-CM | POA: Diagnosis not present

## 2016-03-23 DIAGNOSIS — H02105 Unspecified ectropion of left lower eyelid: Secondary | ICD-10-CM | POA: Diagnosis not present

## 2016-03-29 ENCOUNTER — Encounter: Payer: Self-pay | Admitting: Family Medicine

## 2016-03-29 ENCOUNTER — Ambulatory Visit (INDEPENDENT_AMBULATORY_CARE_PROVIDER_SITE_OTHER): Payer: MEDICARE | Admitting: Family Medicine

## 2016-03-29 VITALS — BP 164/70 | HR 61 | Temp 96.6°F | Ht 72.0 in | Wt 188.0 lb

## 2016-03-29 DIAGNOSIS — I10 Essential (primary) hypertension: Secondary | ICD-10-CM

## 2016-03-29 DIAGNOSIS — E7800 Pure hypercholesterolemia, unspecified: Secondary | ICD-10-CM | POA: Diagnosis not present

## 2016-03-29 DIAGNOSIS — R2689 Other abnormalities of gait and mobility: Secondary | ICD-10-CM

## 2016-03-29 DIAGNOSIS — E559 Vitamin D deficiency, unspecified: Secondary | ICD-10-CM | POA: Diagnosis not present

## 2016-03-29 DIAGNOSIS — R710 Precipitous drop in hematocrit: Secondary | ICD-10-CM

## 2016-03-29 DIAGNOSIS — E538 Deficiency of other specified B group vitamins: Secondary | ICD-10-CM

## 2016-03-29 NOTE — Patient Instructions (Addendum)
Medicare Annual Wellness Visit  [facility] and the medical providers at [facility] strive to bring you the best medical care.  In doing so we not only want to address your current medical conditions and concerns but also to detect new conditions early and prevent illness, disease and health-related problems.    Medicare offers a yearly Wellness Visit which allows our clinical staff to assess your need for preventative services including immunizations, lifestyle education, counseling to decrease risk of preventable diseases and screening for fall risk and other medical concerns.    This visit is provided free of charge (no copay) for all Medicare recipients. The clinical pharmacists at [facility] have begun to conduct these Wellness Visits which will also include a thorough review of all your medications.    As you primary medical provider recommend that you make an appointment for your Annual Wellness Visit if you have not done so already this year.  You may set up this appointment before you leave today or you may call back [phone] and schedule an appointment.  Please make sure when you call that you mention that you are scheduling your Annual Wellness Visit with the clinical pharmacist so that the appointment may be made for the proper length of time.     Continue current medications. Continue good therapeutic lifestyle changes which include good diet and exercise. Fall precautions discussed with patient. If an FOBT was given today- please return it to our front desk. If you are over 65 years old - you may need Prevnar 92 or the adult Pneumonia vaccine.  **Flu shots are available--- please call and schedule a FLU-CLINIC appointment**  After your visit with [name] today you will receive a survey in the mail or online from [employer] regarding your care with [name]. Please take a moment to fill this out. Your feedback is very  important to [name] as you can help [name] better understand your patient needs as well as improve your experience and satisfaction. WE CARE ABOUT YOU!!!  The patient should follow-up with the neurologist and the cardiologist as planned He should try to exercise but always use his walking stick when he is out walking as to steady his balance so he does not fall He should avoid climbing He should can purchase ranitidine or Zantac, the equate brand at [facility] and he should take 150 mg twice daily before breakfast and supper and she should take this regularly for a period of time. This winter he should drink plenty of fluids and stay well hydrated He should continue to watch his sodium intake He should come by the office in a couple of weeks on a Wednesday and let the nurse recheck his blood pressure after he is taking his blood pressure medication

## 2016-03-29 NOTE — Progress Notes (Signed)
Subjective:    Patient ID: Jon Reid, male    DOB: Sep 10, 1928, 83 y.o.   MRN: [id number]  HPI Pt here for follow up and management of chronic medical problems which includes hyperlipidemia and hypertension. He is taking medication regularly.Neurologist feels like the gait instability is multifactorial associated with some pontine ischemia and lumbosacral spine stenosis. H encouraged the patient to use a walking stickto when he is walking longer distances and to get involved in something like Silver sneakers and he will follow up again with him at some point in the future. The visits with the neurologist were reviewed with the patient today. He said he did not call the neurologist that he is also noticed some trouble with writing he is right-handed and that he doesn't seem to right is well and that has just started this started happening. She denies any chest pain pressure or palpitations. His next visit with the cardiologist is sometime this summer. He denies any shortness of breath other than sometimes when he is climbing stairs but not all the time. He does complain of some heartburn and indigestion and does take occasional Prilosec over-the-counter for this. He otherwise denies any blood in the stool black tarry bowel movements or trouble with nausea vomiting diarrhea. He's passing his water without problems. The patient says that his blood pressures at home and they're running in the 130s over the 60s. It is of note that the patient did not take his blood pressure medicine this morning    Patient Active Problem List   Diagnosis Date Noted  . Abnormality of gait 12/06/2015  . Abdominal aortic atherosclerosis ([place]) 12/01/2015  . Lumbar degenerative disc disease 12/01/2015  . Exertional angina ([place]) 07/20/2015  . Hernia of abdominal wall 10/12/2014  . Gynecomastia 04/09/2013  . Hx of CABG 02/02/2013  . 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 Myelodysplastic syndrome 11/06/2012  . Accelerating angina  ([place]) 11/06/2012  . Vitamin B 12 deficiency 07/07/2012  . Hyperlipemia 07/07/2012  . Macrocytosis without anemia 01/08/2012  . PVD 04/14/2009  . CARCINOMA, PROSTATE 04/13/2009  . VASCULAR PURPURA 04/13/2009  . Benign essential HTN 04/13/2009  . Chronic renal insufficiency, stage III (moderate) 04/13/2009  . SLEEP APNEA 04/13/2009  . ELECTROCARDIOGRAM, ABNORMAL 04/13/2009   Outpatient Encounter Prescriptions as of 03/29/2016  Medication Sig  . aspirin EC 325 MG EC tablet Take 1 tablet (325 mg total) by mouth daily.  . cholecalciferol (VITAMIN D) 1000 UNITS tablet Take 2,000 Units by mouth daily.  . Cyanocobalamin (B-12 COMPLIANCE INJECTION) 1000 MCG/ML KIT Inject as directed every 30 (thirty) days.  . FeFum-FePoly-FA-B Cmp-C-Biot (INTEGRA PLUS) CAPS Take 1 capsule by mouth daily.  . fluticasone (FLONASE) 50 MCG/ACT nasal spray One to 2 sprays each nostril at bedtime  . furosemide (LASIX) 40 MG tablet Take 1 tablet (40 mg total) by mouth daily.  . metoprolol succinate (TOPROL-XL) 25 MG 24 hr tablet Take 1 tablet (25 mg total) by mouth daily.  . nitroGLYCERIN (NITROSTAT) 0.4 MG SL tablet Place 1 tablet (0.4 mg total) under the tongue every 5 (five) minutes as needed for chest pain.  Marland Kitchen triamcinolone cream (KENALOG) 0.1 % Apply 1 application topically 2 (two) times daily.   Facility-Administered Encounter Medications as of 03/29/2016  Medication  . cyanocobalamin ((VITAMIN B-12)) injection 1,000 mcg     Review of Systems  HENT: Negative.   Eyes: Negative.   Respiratory: Negative.   Cardiovascular: Negative.   Gastrointestinal: Negative.   Endocrine: Negative.  Genitourinary: Negative.   Musculoskeletal: Negative.   Skin: Negative.   Allergic/Immunologic: Negative.   Neurological: Positive for weakness (several falls - following Dr Jannifer Franklin ).  Hematological: Negative.   Psychiatric/Behavioral: Negative.        Objective:   Physical Exam  Constitutional: He is oriented to  person, place, and time. He appears well-developed and well-nourished. No distress.  HENT:  Head: Normocephalic and atraumatic.  Right Ear: External ear normal.  Left Ear: External ear normal.  Nose: Nose normal.  Mouth/Throat: Oropharynx is clear and moist. No oropharyngeal exudate.  Eyes: Conjunctivae and EOM are normal. Pupils are equal, round, and reactive to light. Right eye exhibits no discharge. Left eye exhibits no discharge. No scleral icterus.  Neck: Normal range of motion. Neck supple. No thyromegaly present.  Cardiovascular: Normal rate, regular rhythm and normal heart sounds.   No murmur heard. The heart is regular at 60/m. The pedal pulses in the left foot were somewhat decreased compared to the right. There was some edema above the sock line bilaterally. On reviewing his weight is weight is up some over the Christmas holidays and the patient admits to eating more than he should have.  Pulmonary/Chest: Effort normal and breath sounds normal. No respiratory distress. He has no wheezes. He has no rales. He exhibits no tenderness.  Clear anteriorly and posteriorly and no axillary adenopathy  Abdominal: Soft. Bowel sounds are normal. He exhibits no mass. There is no tenderness. There is no rebound and no guarding.  No masses tenderness or organ enlargement and good inguinal pulses bilaterally  Genitourinary:  Genitourinary Comments: The patient sees the urologist regularly because of his prostate cancer.  Musculoskeletal: Normal range of motion. He exhibits edema. He exhibits no tenderness.  Lymphadenopathy:    He has no cervical adenopathy.  Neurological: He is alert and oriented to person, place, and time. He has normal reflexes. No cranial nerve deficit.  Brisk lower extremity reflexes right more than the left.  Skin: Skin is warm and dry. No rash noted.  Psychiatric: He has a normal mood and affect. His behavior is normal. Judgment and thought content normal.  Nursing note and  vitals reviewed.  BP (!) 155/58 (BP Location: Left Arm)   Pulse 61   Temp (!) 96.6 F (35.9 C) (Oral)   Ht 6' (1.829 m)   Wt 188 lb (85.3 kg)   BMI 25.50 kg/m         Assessment & Plan:  1. Benign essential HTN -The blood pressure is elevated today and the patient will continue with current treatment. He did not take his medicine this morning. - CBC with Differential/Platelet - BMP8+EGFR - Hepatic function panel  2. Pure hypercholesterolemia -Continue with aggressive therapeutic lifestyle changes - CBC with Differential/Platelet - Lipid panel  3. Vitamin D deficiency -Continue with vitamin D replacement pending results of lab work - CBC with Differential/Platelet - VITAMIN D 25 Hydroxy (Vit-D Deficiency, Fractures)  4. Peripheral neuropathy -Continue to follow-up with neurology and follow recommendations by him to try to get more exercise and to use a walking stick when walking on flat surfaces so as not to fall  5. Prostate cancer follow-up with urology as planned  No orders of the defined types were placed in this encounter.  Patient Instructions                       Medicare Annual Wellness Visit  [facility] and the medical providers at [facility]  [facility] strive to bring you the best medical care.  In doing so we not only want to address your current medical conditions and concerns but also to detect new conditions early and prevent illness, disease and health-related problems.    Medicare offers a yearly Wellness Visit which allows our clinical staff to assess your need for preventative services including immunizations, lifestyle education, counseling to decrease risk of preventable diseases and screening for fall risk and other medical concerns.    This visit is provided free of charge (no copay) for all Medicare recipients. The clinical pharmacists at [facility] have begun to conduct these Wellness Visits which will also  include a thorough review of all your medications.    As you primary medical provider recommend that you make an appointment for your Annual Wellness Visit if you have not done so already this year.  You may set up this appointment before you leave today or you may call back [phone]) and schedule an appointment.  Please make sure when you call that you mention that you are scheduling your Annual Wellness Visit with the clinical pharmacist so that the appointment may be made for the proper length of time.     Continue current medications. Continue good therapeutic lifestyle changes which include good diet and exercise. Fall precautions discussed with patient. If an FOBT was given today- please return it to our front desk. If you are over 83 years old - you may need Prevnar 100 or the adult Pneumonia vaccine.  **Flu shots are available--- please call and schedule a FLU-CLINIC appointment**  After your visit with [name] today you will receive a survey in the mail or online from [employer] regarding your care with [name]. Please take a moment to fill this out. Your feedback is very important to [name] as you can help [name] better understand your patient needs as well as improve your experience and satisfaction. WE CARE ABOUT YOU!!!  The patient should follow-up with the neurologist and the cardiologist as planned He should try to exercise but always use his walking stick when he is out walking as to steady his balance so he does not fall He should avoid climbing He should can purchase ranitidine or Zantac, the equate brand at [facility] and he should take 150 mg twice daily before breakfast and supper and she should take this regularly for a period of time. This winter he should drink plenty of fluids and stay well hydrated He should continue to watch his sodium intake He should come by the office in a couple of weeks on a Wednesday and let the nurse recheck his blood pressure after he is taking his blood pressure  medication   Arrie Senate MD

## 2016-03-30 LAB — CBC WITH DIFFERENTIAL/PLATELET
BASOS ABS: 0 10*3/uL (ref 0.0–0.2)
Basos: 0 %
EOS (ABSOLUTE): 0.1 10*3/uL (ref 0.0–0.4)
Eos: 2 %
HEMOGLOBIN: 11.7 g/dL — AB (ref 13.0–17.7)
Hematocrit: 35 % — ABNORMAL LOW (ref 37.5–51.0)
Immature Grans (Abs): 0 10*3/uL (ref 0.0–0.1)
Immature Granulocytes: 0 %
LYMPHS ABS: 2.7 10*3/uL (ref 0.7–3.1)
Lymphs: 40 %
MCH: 33.9 pg — AB (ref 26.6–33.0)
MCHC: 33.4 g/dL (ref 31.5–35.7)
MCV: 101 fL — ABNORMAL HIGH (ref 79–97)
Monocytes Absolute: 0.7 10*3/uL (ref 0.1–0.9)
Monocytes: 10 %
NEUTROS ABS: 3.2 10*3/uL (ref 1.4–7.0)
Neutrophils: 48 %
PLATELETS: 248 10*3/uL (ref 150–379)
RBC: 3.45 x10E6/uL — AB (ref 4.14–5.80)
RDW: 13.5 % (ref 12.3–15.4)
WBC: 6.7 10*3/uL (ref 3.4–10.8)

## 2016-03-30 LAB — BMP8+EGFR
BUN / CREAT RATIO: 16 (ref 10–24)
BUN: 21 mg/dL (ref 8–27)
CHLORIDE: 104 mmol/L (ref 96–106)
CO2: 23 mmol/L (ref 18–29)
CREATININE: 1.33 mg/dL — AB (ref 0.76–1.27)
Calcium: 9.2 mg/dL (ref 8.6–10.2)
GFR calc Af Amer: 56 mL/min/{1.73_m2} — ABNORMAL LOW (ref >59)
GFR calc non Af Amer: 48 mL/min/{1.73_m2} — ABNORMAL LOW (ref >59)
GLUCOSE: 95 mg/dL (ref 65–99)
POTASSIUM: 4.5 mmol/L (ref 3.5–5.2)
SODIUM: 142 mmol/L (ref 134–144)

## 2016-03-30 LAB — HEPATIC FUNCTION PANEL
ALBUMIN: 4.5 g/dL (ref 3.5–4.7)
ALK PHOS: 69 IU/L (ref 39–117)
ALT: 16 IU/L (ref 0–44)
AST: 25 IU/L (ref 0–40)
Bilirubin Total: 0.6 mg/dL (ref 0.0–1.2)
Bilirubin, Direct: 0.18 mg/dL (ref 0.00–0.40)
TOTAL PROTEIN: 6.6 g/dL (ref 6.0–8.5)

## 2016-03-30 LAB — VITAMIN D 25 HYDROXY (VIT D DEFICIENCY, FRACTURES): Vit D, 25-Hydroxy: 51.2 ng/mL (ref 30.0–100.0)

## 2016-03-30 LAB — LIPID PANEL
CHOL/HDL RATIO: 2.5 ratio (ref 0.0–5.0)
CHOLESTEROL TOTAL: 161 mg/dL (ref 100–199)
HDL: 64 mg/dL (ref >39)
LDL CALC: 85 mg/dL (ref 0–99)
TRIGLYCERIDES: 60 mg/dL (ref 0–149)
VLDL Cholesterol Cal: 12 mg/dL (ref 5–40)

## 2016-04-09 ENCOUNTER — Ambulatory Visit (INDEPENDENT_AMBULATORY_CARE_PROVIDER_SITE_OTHER): Payer: MEDICARE | Admitting: *Deleted

## 2016-04-09 DIAGNOSIS — E538 Deficiency of other specified B group vitamins: Secondary | ICD-10-CM | POA: Diagnosis not present

## 2016-04-09 NOTE — Patient Instructions (Signed)
Cyanocobalamin, Vitamin B12 injection What is this medicine? CYANOCOBALAMIN (sye an oh koe BAL a min) is a man made form of vitamin B12. Vitamin B12 is used in the growth of healthy blood cells, nerve cells, and proteins in the body. It also helps with the metabolism of fats and carbohydrates. This medicine is used to treat people who can not absorb vitamin B12. This medicine may be used for other purposes; ask your health care provider or pharmacist if you have questions. COMMON BRAND NAME(S): B-12 Compliance Kit, B-12 Injection Kit, Cyomin, LA-12, Nutri-Twelve, Physicians EZ Use B-12, Primabalt What should I tell my health care provider before I take this medicine? They need to know if you have any of these conditions: -kidney disease -Leber's disease -megaloblastic anemia -an unusual or allergic reaction to cyanocobalamin, cobalt, other medicines, foods, dyes, or preservatives -pregnant or trying to get pregnant -breast-feeding How should I use this medicine? This medicine is injected into a muscle or deeply under the skin. It is usually given by a health care professional in a clinic or doctor's office. However, your doctor may teach you how to inject yourself. Follow all instructions. Talk to your pediatrician regarding the use of this medicine in children. Special care may be needed. Overdosage: If you think you have taken too much of this medicine contact a poison control center or emergency room at once. NOTE: This medicine is only for you. Do not share this medicine with others. What if I miss a dose? If you are given your dose at a clinic or doctor's office, call to reschedule your appointment. If you give your own injections and you miss a dose, take it as soon as you can. If it is almost time for your next dose, take only that dose. Do not take double or extra doses. What may interact with this medicine? -colchicine -heavy alcohol intake This list may not describe all possible  interactions. Give your health care provider a list of all the medicines, herbs, non-prescription drugs, or dietary supplements you use. Also tell them if you smoke, drink alcohol, or use illegal drugs. Some items may interact with your medicine. What should I watch for while using this medicine? Visit your doctor or health care professional regularly. You may need blood work done while you are taking this medicine. You may need to follow a special diet. Talk to your doctor. Limit your alcohol intake and avoid smoking to get the best benefit. What side effects may I notice from receiving this medicine? Side effects that you should report to your doctor or health care professional as soon as possible: -allergic reactions like skin rash, itching or hives, swelling of the face, lips, or tongue -blue tint to skin -chest tightness, pain -difficulty breathing, wheezing -dizziness -red, swollen painful area on the leg Side effects that usually do not require medical attention (report to your doctor or health care professional if they continue or are bothersome): -diarrhea -headache This list may not describe all possible side effects. Call your doctor for medical advice about side effects. You may report side effects to FDA at 1-800-FDA-1088. Where should I keep my medicine? Keep out of the reach of children. Store at room temperature between 15 and 30 degrees C (59 and 85 degrees F). Protect from light. Throw away any unused medicine after the expiration date. NOTE: This sheet is a summary. It may not cover all possible information. If you have questions about this medicine, talk to your doctor, pharmacist, or   health care provider.  2017 Elsevier/Gold Standard (2007-06-16 22:10:20)  

## 2016-04-09 NOTE — Progress Notes (Signed)
Vitamin b12 injection given and tolerated well.  

## 2016-04-12 DIAGNOSIS — M10072 Idiopathic gout, left ankle and foot: Secondary | ICD-10-CM | POA: Diagnosis not present

## 2016-04-12 DIAGNOSIS — R601 Generalized edema: Secondary | ICD-10-CM | POA: Diagnosis not present

## 2016-04-12 DIAGNOSIS — M79672 Pain in left foot: Secondary | ICD-10-CM | POA: Diagnosis not present

## 2016-05-10 ENCOUNTER — Ambulatory Visit (INDEPENDENT_AMBULATORY_CARE_PROVIDER_SITE_OTHER): Payer: MEDICARE | Admitting: *Deleted

## 2016-05-10 DIAGNOSIS — E538 Deficiency of other specified B group vitamins: Secondary | ICD-10-CM | POA: Diagnosis not present

## 2016-05-10 NOTE — Progress Notes (Signed)
Pt given vit B12 inj Tolerated well 

## 2016-06-11 ENCOUNTER — Ambulatory Visit (INDEPENDENT_AMBULATORY_CARE_PROVIDER_SITE_OTHER): Payer: MEDICARE | Admitting: *Deleted

## 2016-06-11 DIAGNOSIS — E538 Deficiency of other specified B group vitamins: Secondary | ICD-10-CM

## 2016-06-11 NOTE — Progress Notes (Signed)
Pt given Vit B12 inj Tolerated well 

## 2016-07-12 ENCOUNTER — Other Ambulatory Visit: Payer: Self-pay | Admitting: Family Medicine

## 2016-07-16 ENCOUNTER — Ambulatory Visit (INDEPENDENT_AMBULATORY_CARE_PROVIDER_SITE_OTHER): Payer: MEDICARE | Admitting: *Deleted

## 2016-07-16 VITALS — BP 154/67 | HR 58 | Temp 97.2°F | Ht 68.0 in | Wt 182.0 lb

## 2016-07-16 DIAGNOSIS — Z0000 Encounter for general adult medical examination without abnormal findings: Secondary | ICD-10-CM

## 2016-07-16 DIAGNOSIS — Z23 Encounter for immunization: Secondary | ICD-10-CM

## 2016-07-16 DIAGNOSIS — E538 Deficiency of other specified B group vitamins: Secondary | ICD-10-CM

## 2016-07-16 NOTE — Progress Notes (Signed)
Subjective:   Jon Reid is a 83 y.o. male who presents for Medicare Annual/Subsequent preventive examination. He is retired from the railroad and he lives at home with his wife and 2 of his 3 sons. 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 He walks in town every evening and he eats 3 healthy meals a day, usually home cooked. He has a few cats that live outside and he is aware of fall risk in the home. He states that his health is about the same as it was a year ago.        Objective:    Vitals: BP (!) 154/67 (BP Location: Left Arm)   Pulse (!) 58   Temp 97.2 F (36.2 C) (Oral)   Ht 5' 8" (1.727 m)   Wt 182 lb (82.6 kg)   BMI 27.67 kg/m   Body mass index is 27.67 kg/m.  Tobacco History  Smoking Status  . Never Smoker  Smokeless Tobacco  . Never Used     Counseling given: Not Answered he does not use any tobacco products   Past Medical History:  Diagnosis Date  . Carcinoma of prostate [facility])    prostate  . CKD (chronic kidney disease) stage 2, GFR 60-89 ml/min   . Coronary artery disease    a. LHC (8/14):  3v CAD => s/p CABG (LIMA-LAD, SVG-OM1, SVG-PDA)  . GERD (gastroesophageal reflux disease)   . Gout   . HTN (hypertension)   . Hx of echocardiogram    Echocardiogram 11/13/12: EF 55-60%, normal wall motion, biatrial enlargement, mild AI, mild TR, no pericardial effusion  . Sleep apnea    Questionable   Past Surgical History:  Procedure Laterality Date  . CARDIAC CATHETERIZATION  11/07/2012   Dr Acie Fredrickson  . CORONARY ARTERY BYPASS GRAFT N/A 11/11/2012   Procedure: CORONARY ARTERY BYPASS GRAFTING times four using Right Greater Saphenous Vein Graft harvested endoscopically and Left Internal Mammary Artery.;  Surgeon: Ivin Poot, MD;  Location: [place];  Service: Open Heart Surgery;  Laterality: N/A;  . INTRAOPERATIVE TRANSESOPHAGEAL ECHOCARDIOGRAM N/A 11/11/2012   Procedure: INTRAOPERATIVE TRANSESOPHAGEAL ECHOCARDIOGRAM;  Surgeon:  Ivin Poot, MD;  Location: [place];  Service: Open Heart Surgery;  Laterality: N/A;  . LEFT HEART CATHETERIZATION WITH CORONARY ANGIOGRAM N/A 11/07/2012   Procedure: LEFT HEART CATHETERIZATION WITH CORONARY ANGIOGRAM;  Surgeon: Thayer Headings, MD;  Location: [facility] CATH LAB;  Service: Cardiovascular;  Laterality: N/A;  . LYMPH NODE DISSECTION     Bilateral pelvic  . RETROPUBIC PROSTATECTOMY     Radical   Family History  Problem Relation Age of Onset  . Aneurysm Father     Cerebral  . Stroke Father   . Hypertension Father   . Cancer Mother     colon  . Parkinson's disease Son    History  Sexual Activity  . Sexual activity: Not Currently    Outpatient Encounter Prescriptions as of 07/16/2016  Medication Sig  . aspirin EC 81 MG tablet Take 81 mg by mouth daily.  . cholecalciferol (VITAMIN D) 1000 UNITS tablet Take 2,000 Units by mouth daily.  . Cyanocobalamin (B-12 COMPLIANCE INJECTION) 1000 MCG/ML KIT Inject as directed every 30 (thirty) days.  . fluticasone (FLONASE) 50 MCG/ACT nasal spray One to 2 sprays each nostril at bedtime  . furosemide (LASIX) 40 MG tablet TAKE 1 TABLET DAILY  . metoprolol succinate (TOPROL-XL) 25 MG 24 hr tablet Take 1  tablet (25 mg total) by mouth daily.  Marland Kitchen triamcinolone cream (KENALOG) 0.1 % Apply 1 application topically 2 (two) times daily.  . nitroGLYCERIN (NITROSTAT) 0.4 MG SL tablet Place 1 tablet (0.4 mg total) under the tongue every 5 (five) minutes as needed for chest pain. (Patient not taking: Reported on 07/16/2016)  . [DISCONTINUED] aspirin EC 325 MG EC tablet Take 1 tablet (325 mg total) by mouth daily.  . [DISCONTINUED] FeFum-FePoly-FA-B Cmp-C-Biot (INTEGRA PLUS) CAPS Take 1 capsule by mouth daily.   Facility-Administered Encounter Medications as of 07/16/2016  Medication  . cyanocobalamin ((VITAMIN B-12)) injection 1,000 mcg    Activities of Daily Living In your present state of health, do you have any difficulty performing the following  activities: 07/16/2016  Hearing? Y  Vision? Y  Difficulty concentrating or making decisions? Y  Walking or climbing stairs? N  Dressing or bathing? N  Doing errands, shopping? N  Some recent data might be hidden  he has eye lid droop - which makes vision somewhat impaired  Hearing changes - but still doesn't need hearing aids to hear. He states he misplaces objects occasionally   Patient Care Team: Chipper Herb, MD as PCP - General (Family Medicine) Festus Aloe, MD (Urology) Curt Bears, MD (Hematology and Oncology) Warden Fillers, MD as Consulting Physician (Ophthalmology) Juanita Craver, MD as Consulting Physician (Gastroenterology) Latanya Maudlin, MD as Consulting Physician (Orthopedic Surgery) Kathrynn Ducking, MD as Consulting Physician (Neurology)   Assessment:    Exercise Activities and Dietary recommendations    Goals    . Prevent Falls          Be careful and do not put yourself at risk for falls       Fall Risk Fall Risk  07/16/2016 03/29/2016 12/28/2015 12/06/2015 11/30/2015  Falls in the past year? _0   Number falls in past yr: 2 or more 2 or more 2 or more 2 or more 2 or more  Injury with Fall? No Yes No No No  Risk for fall due to : - - - Impaired balance/gait -  Follow up - - - Falls prevention discussed -   Depression Screen PHQ 2/9 Scores 07/16/2016 03/29/2016 12/28/2015 11/30/2015  PHQ - 2 Score 0 0 0 0    Cognitive Function MMSE - Mini Mental State Exam 07/16/2016  Orientation to time 5  Orientation to Place 5  Registration 3  Attention/ Calculation 4  Recall 2  Language- name 2 objects 2  Language- repeat 1  Language- follow 3 step command 3  Language- read & follow direction 1  Write a sentence 1  Copy design 1  Total score 28    score today is 28/30.     Immunization History  Administered Date(s) Administered  . Influenza, High Dose Seasonal PF 01/02/2016  . Influenza,inj,Quad PF,36+ Mos 12/29/2012,  01/12/2014, 12/28/2014  . Pneumococcal Conjugate-13 04/09/2013  . Pneumococcal Polysaccharide-23 06/17/2009  . Tdap 07/16/2016   Screening Tests Health Maintenance  Topic Date Due  . TETANUS/TDAP  11/27/2016 (Originally 03/20/2015)  . INFLUENZA VACCINE  10/17/2016  . PNA vac Low Risk Adult  Completed     tetanus given today Plan:     He was given a Tetanus and his routine B12 injection today. He will keep follow up with Dr Laurance Flatten and other specialist He will review the advanced directives  I have personally reviewed and noted the following in the patient's chart:   . Medical and social history .  Use of alcohol, tobacco or illicit drugs  . Current medications and supplements . Functional ability and status . Nutritional status . Physical activity . Advanced directives . List of other physicians . Hospitalizations, surgeries, and ER visits in previous 12 months . Vitals . Screenings to include cognitive, depression, and falls . Referrals and appointments  In addition, I have reviewed and discussed with patient certain preventive protocols, quality metrics, and best practice recommendations. A written personalized care plan for preventive services as well as general preventive health recommendations were provided to patient.     Bullins, Cameron Proud, LPN  6/80/3212  I have reviewed and agree with the above AWV documentation.   Mary-Margaret Hassell Done, FNP

## 2016-07-16 NOTE — Patient Instructions (Signed)
  Jon Reid , Thank you for taking time to come for your Medicare Wellness Visit. I appreciate your ongoing commitment to your health goals. Please review the following plan we discussed and let me know if I can assist you in the future.   These are the goals we discussed: Goals    . Prevent Falls          Be careful and do not put yourself at risk for falls        This is a list of the screening recommended for you and due dates:  Health Maintenance  Topic Date Due  . Tetanus Vaccine  11/27/2016*  . Flu Shot  10/17/2016  . Pneumonia vaccines  Completed  *Topic was postponed. The date shown is not the original due date.    Tetanus and B 12 shot given today  Keep follow up with Dr Laurance Flatten and other specialist  Review the advanced directives.

## 2016-08-17 ENCOUNTER — Ambulatory Visit: Payer: MEDICARE | Admitting: Family Medicine

## 2016-08-17 ENCOUNTER — Ambulatory Visit (INDEPENDENT_AMBULATORY_CARE_PROVIDER_SITE_OTHER): Payer: MEDICARE | Admitting: *Deleted

## 2016-08-17 DIAGNOSIS — E538 Deficiency of other specified B group vitamins: Secondary | ICD-10-CM

## 2016-08-17 MED ORDER — CYANOCOBALAMIN 1000 MCG/ML IJ SOLN
1000.0000 ug | INTRAMUSCULAR | Status: DC
  Administered 2016-08-17 – 2016-12-21 (×5): 1000 ug via INTRAMUSCULAR

## 2016-08-17 NOTE — Progress Notes (Signed)
Pt given Vit B12 inj Tolerated well 

## 2016-09-03 ENCOUNTER — Encounter: Payer: Self-pay | Admitting: Family Medicine

## 2016-09-03 ENCOUNTER — Ambulatory Visit (INDEPENDENT_AMBULATORY_CARE_PROVIDER_SITE_OTHER): Payer: MEDICARE | Admitting: Family Medicine

## 2016-09-03 VITALS — BP 131/69 | HR 58 | Temp 98.1°F | Ht 68.0 in | Wt 173.0 lb

## 2016-09-03 DIAGNOSIS — C61 Malignant neoplasm of prostate: Secondary | ICD-10-CM | POA: Diagnosis not present

## 2016-09-03 DIAGNOSIS — I10 Essential (primary) hypertension: Secondary | ICD-10-CM

## 2016-09-03 DIAGNOSIS — I700 Atherosclerosis of aorta: Secondary | ICD-10-CM | POA: Diagnosis not present

## 2016-09-03 DIAGNOSIS — R29898 Other symptoms and signs involving the musculoskeletal system: Secondary | ICD-10-CM | POA: Diagnosis not present

## 2016-09-03 DIAGNOSIS — E7800 Pure hypercholesterolemia, unspecified: Secondary | ICD-10-CM | POA: Diagnosis not present

## 2016-09-03 DIAGNOSIS — R269 Unspecified abnormalities of gait and mobility: Secondary | ICD-10-CM

## 2016-09-03 DIAGNOSIS — M48061 Spinal stenosis, lumbar region without neurogenic claudication: Secondary | ICD-10-CM | POA: Diagnosis not present

## 2016-09-03 DIAGNOSIS — E538 Deficiency of other specified B group vitamins: Secondary | ICD-10-CM

## 2016-09-03 DIAGNOSIS — E559 Vitamin D deficiency, unspecified: Secondary | ICD-10-CM

## 2016-09-03 DIAGNOSIS — D469 Myelodysplastic syndrome, unspecified: Secondary | ICD-10-CM | POA: Diagnosis not present

## 2016-09-03 NOTE — Progress Notes (Signed)
Subjective:    Patient ID: Jon Reid, male    DOB: 1929/01/27, 83 y.o.   MRN: [id number]  HPI Pt here for follow up and management of chronic medical problems which includes hyperlipidemia. He is taking medication regularly.He has this lower extremity weakness or gait disturbance and has been followed by the neurologist. He did have an MRI of the brain which showed moderate cortical atrophy. An MRI of the back showed bulging disc with facet hypertrophy and foraminal stenosis with moderate severe spinal stenosis at L3 and 4. He also biforaminal stenosis. His main complaint today is weakness and fatigue which is ongoing. The patient does have some chest tightness sometimes and sometimes he does not have that. He thinks it is not any more frequent than usual. The shortness of breath is to him the same thing as the chest tightness. He denies any nausea vomiting diarrhea or blood in the stool. He has had some occasional heartburn and associates this with the food he has eaten. He's passing his water without problems and has more frequency with taking Lasix. He does have an appointment with the neurologist tomorrow and we reviewed his recent MRI of the back which showed multilevel degenerative changes and facet hypertrophy and spinal stenosis. This is most likely what is contributing to his leg weakness.   Patient Active Problem List   Diagnosis Date Noted  . Abnormality of gait 12/06/2015  . Abdominal aortic atherosclerosis ([place]) 12/01/2015  . Lumbar degenerative disc disease 12/01/2015  . Exertional angina ([place]) 07/20/2015  . Hernia of abdominal wall 10/12/2014  . Gynecomastia 04/09/2013  . Hx of CABG 02/02/2013  . 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 Myelodysplastic syndrome 11/06/2012  . Accelerating angina ([place]) 11/06/2012  . Vitamin B 12 deficiency 07/07/2012  . Hyperlipemia 07/07/2012  . Macrocytosis without anemia 01/08/2012  . PVD 04/14/2009  . CARCINOMA, PROSTATE 04/13/2009  . VASCULAR  PURPURA 04/13/2009  . Benign essential HTN 04/13/2009  . Chronic renal insufficiency, stage III (moderate) 04/13/2009  . SLEEP APNEA 04/13/2009  . ELECTROCARDIOGRAM, ABNORMAL 04/13/2009   Outpatient Encounter Prescriptions as of 09/03/2016  Medication Sig  . aspirin EC 81 MG tablet Take 81 mg by mouth daily.  . cholecalciferol (VITAMIN D) 1000 UNITS tablet Take 2,000 Units by mouth daily.  . Cyanocobalamin (B-12 COMPLIANCE INJECTION) 1000 MCG/ML KIT Inject as directed every 30 (thirty) days.  . fluticasone (FLONASE) 50 MCG/ACT nasal spray One to 2 sprays each nostril at bedtime  . furosemide (LASIX) 40 MG tablet TAKE 1 TABLET DAILY  . metoprolol succinate (TOPROL-XL) 25 MG 24 hr tablet Take 1 tablet (25 mg total) by mouth daily.  . nitroGLYCERIN (NITROSTAT) 0.4 MG SL tablet Place 1 tablet (0.4 mg total) under the tongue every 5 (five) minutes as needed for chest pain.  Marland Kitchen triamcinolone cream (KENALOG) 0.1 % Apply 1 application topically 2 (two) times daily.   Facility-Administered Encounter Medications as of 09/03/2016  Medication  . cyanocobalamin ((VITAMIN B-12)) injection 1,000 mcg       Review of Systems  Constitutional: Positive for fatigue.  HENT: Negative.   Eyes: Negative.   Respiratory: Negative.   Cardiovascular: Negative.   Gastrointestinal: Negative.   Endocrine: Negative.   Genitourinary: Negative.   Musculoskeletal: Negative.   Skin: Negative.   Allergic/Immunologic: Negative.   Neurological: Positive for weakness.  Hematological: Negative.   Psychiatric/Behavioral: Negative.        Objective:   Physical Exam  Constitutional: He is oriented to person,  place, and time. He appears well-developed and well-nourished. No distress.  The patient is pleasant and alert and kyphotic and posture.  HENT:  Head: Normocephalic and atraumatic.  Mouth/Throat: Oropharynx is clear and moist. No oropharyngeal exudate.  Nasal congestion left and bilateral ear cerumen  Eyes:  Conjunctivae and EOM are normal. Pupils are equal, round, and reactive to light. Right eye exhibits no discharge. Left eye exhibits no discharge. No scleral icterus.  Neck: Normal range of motion. Neck supple. No thyromegaly present.  No bruits audible and no adenopathy or thyromegaly  Cardiovascular: Normal rate, regular rhythm and intact distal pulses.   Murmur heard. Grade 3/6 systolic ejection murmur at 60/m  Pulmonary/Chest: Effort normal and breath sounds normal. No respiratory distress. He has no wheezes. He has no rales. He exhibits no tenderness.  Clear anteriorly and posteriorly no axillary adenopathy  Abdominal: Soft. Bowel sounds are normal. He exhibits no mass. There is no tenderness. There is no rebound and no guarding.  No abdominal tenderness masses or bruits or inguinal adenopathy  Musculoskeletal: Normal range of motion. He exhibits no edema.  The patient has a kyphotic posture  Lymphadenopathy:    He has no cervical adenopathy.  Neurological: He is alert and oriented to person, place, and time. He has normal reflexes. No cranial nerve deficit.  Skin: Skin is warm and dry. No rash noted. No erythema.  Psychiatric: He has a normal mood and affect. His behavior is normal. Judgment and thought content normal.  Nursing note and vitals reviewed.  BP 131/69 (BP Location: Left Arm)   Pulse (!) 58   Temp 98.1 F (36.7 C) (Oral)   Ht 5' 8"  (1.727 m)   Wt 173 lb (78.5 kg)   BMI 26.30 kg/m    Both the ears will be irrigated to remove cerumen     Assessment & Plan:  1. Benign essential HTN -The blood pressure is good today and he will continue with current treatment - CBC with Differential/Platelet - BMP8+EGFR - Hepatic function panel  2. Pure hypercholesterolemia -The patient has been statin intolerant in the past and he will continue with aggressive therapeutic lifestyle changes - CBC with Differential/Platelet - Lipid panel  3. Vitamin D deficiency -Continue with  current treatment pending results of lab work - CBC with Differential/Platelet - VITAMIN D 25 Hydroxy (Vit-D Deficiency, Fractures)  4. Vitamin B 12 deficiency -Continue with B12 injections pending results of lab work - CBC with Differential/Platelet  5. Leg weakness, bilateral -The patient sees the neurologist tomorrow he has stated that there is no weakness but there may be some neuropathy from his back affecting the strength in his legs. The MRI report was reviewed with the patient today  6. Myelodysplastic syndrome -Follow-up with oncologist and make sure that he gets a copy of the blood work that is done today.  7. B12 deficiency -Continue with B12 injections  8. Prostate cancer [facility]) -Follow-up with urology  9. Gait disturbance -Follow-up with neurology  10. Spinal stenosis of lumbar region at multiple levels -This is per recent MRI report. Neurology will see patient tomorrow. May need referral for evaluation for possible injections from either orthopedic surgeon or neurosurgeon.  11. Abdominal aortic atherosclerosis ([place]) -Continue with aggressive therapeutic lifestyle changes which include diet and exercise  Patient Instructions                       Medicare Annual Wellness Visit  [facility] and the medical providers  at [facility] strive to bring you the best medical care.  In doing so we not only want to address your current medical conditions and concerns but also to detect new conditions early and prevent illness, disease and health-related problems.    Medicare offers a yearly Wellness Visit which allows our clinical staff to assess your need for preventative services including immunizations, lifestyle education, counseling to decrease risk of preventable diseases and screening for fall risk and other medical concerns.    This visit is provided free of charge (no copay) for all Medicare recipients. The clinical pharmacists at [facility] have begun to conduct these Wellness Visits which will also include a thorough review of all your medications.    As you primary medical provider recommend that you make an appointment for your Annual Wellness Visit if you have not done so already this year.  You may set up this appointment before you leave today or you may call back ([phone]) and schedule an appointment.  Please make sure when you call that you mention that you are scheduling your Annual Wellness Visit with the clinical pharmacist so that the appointment may be made for the proper length of time.     Continue current medications. Continue good therapeutic lifestyle changes which include good diet and exercise. Fall precautions discussed with patient. If an FOBT was given today- please return it to our front desk. If you are over 45 years old - you may need Prevnar 73 or the adult Pneumonia vaccine.  **Flu shots are available--- please call and schedule a FLU-CLINIC appointment**  After your visit with [name] today you will receive a survey in the mail or online from [employer] regarding your care with [name]. Please take a moment to fill this out. Your feedback is very important to [name] as you can help [name] better understand your patient needs as well as improve your experience and satisfaction. WE CARE ABOUT YOU!!!     Arrie Senate MD

## 2016-09-03 NOTE — Patient Instructions (Signed)
Medicare Annual Wellness Visit  [facility] and the medical providers at [facility] strive to bring you the best medical care.  In doing so we not only want to address your current medical conditions and concerns but also to detect new conditions early and prevent illness, disease and health-related problems.    Medicare offers a yearly Wellness Visit which allows our clinical staff to assess your need for preventative services including immunizations, lifestyle education, counseling to decrease risk of preventable diseases and screening for fall risk and other medical concerns.    This visit is provided free of charge (no copay) for all Medicare recipients. The clinical pharmacists at [facility] have begun to conduct these Wellness Visits which will also include a thorough review of all your medications.    As you primary medical provider recommend that you make an appointment for your Annual Wellness Visit if you have not done so already this year.  You may set up this appointment before you leave today or you may call back ([phone]) and schedule an appointment.  Please make sure when you call that you mention that you are scheduling your Annual Wellness Visit with the clinical pharmacist so that the appointment may be made for the proper length of time.     Continue current medications. Continue good therapeutic lifestyle changes which include good diet and exercise. Fall precautions discussed with patient. If an FOBT was given today- please return it to our front desk. If you are over 50 years old - you may need Prevnar 13 or the adult Pneumonia vaccine.  **Flu shots are available--- please call and schedule a FLU-CLINIC appointment**  After your visit with us today you will receive a survey in the mail or online from [employer] regarding your care with us. Please take a moment to fill this out. Your feedback is very  important to us as you can help us better understand your patient needs as well as improve your experience and satisfaction. WE CARE ABOUT YOU!!!    

## 2016-09-04 ENCOUNTER — Other Ambulatory Visit: Payer: MEDICARE

## 2016-09-04 ENCOUNTER — Encounter: Payer: Self-pay | Admitting: Neurology

## 2016-09-04 ENCOUNTER — Ambulatory Visit (INDEPENDENT_AMBULATORY_CARE_PROVIDER_SITE_OTHER): Payer: MEDICARE | Admitting: Neurology

## 2016-09-04 VITALS — BP 161/67 | HR 58 | Wt 170.0 lb

## 2016-09-04 DIAGNOSIS — R269 Unspecified abnormalities of gait and mobility: Secondary | ICD-10-CM

## 2016-09-04 DIAGNOSIS — Z1211 Encounter for screening for malignant neoplasm of colon: Secondary | ICD-10-CM | POA: Diagnosis not present

## 2016-09-04 LAB — CBC WITH DIFFERENTIAL/PLATELET
BASOS ABS: 0 10*3/uL (ref 0.0–0.2)
Basos: 1 %
EOS (ABSOLUTE): 0.1 10*3/uL (ref 0.0–0.4)
Eos: 2 %
HEMOGLOBIN: 11.3 g/dL — AB (ref 13.0–17.7)
Hematocrit: 34.6 % — ABNORMAL LOW (ref 37.5–51.0)
Immature Grans (Abs): 0 10*3/uL (ref 0.0–0.1)
Immature Granulocytes: 0 %
LYMPHS ABS: 2.1 10*3/uL (ref 0.7–3.1)
Lymphs: 36 %
MCH: 31.2 pg (ref 26.6–33.0)
MCHC: 32.7 g/dL (ref 31.5–35.7)
MCV: 96 fL (ref 79–97)
MONOS ABS: 0.5 10*3/uL (ref 0.1–0.9)
Monocytes: 9 %
NEUTROS ABS: 3.2 10*3/uL (ref 1.4–7.0)
Neutrophils: 52 %
PLATELETS: 234 10*3/uL (ref 150–379)
RBC: 3.62 x10E6/uL — ABNORMAL LOW (ref 4.14–5.80)
RDW: 15.3 % (ref 12.3–15.4)
WBC: 6 10*3/uL (ref 3.4–10.8)

## 2016-09-04 LAB — LIPID PANEL
CHOLESTEROL TOTAL: 150 mg/dL (ref 100–199)
Chol/HDL Ratio: 2.7 ratio (ref 0.0–5.0)
HDL: 56 mg/dL (ref >39)
LDL CALC: 82 mg/dL (ref 0–99)
Triglycerides: 62 mg/dL (ref 0–149)
VLDL Cholesterol Cal: 12 mg/dL (ref 5–40)

## 2016-09-04 LAB — BMP8+EGFR
BUN / CREAT RATIO: 16 (ref 10–24)
BUN: 23 mg/dL (ref 8–27)
CO2: 22 mmol/L (ref 20–29)
CREATININE: 1.43 mg/dL — AB (ref 0.76–1.27)
Calcium: 9.4 mg/dL (ref 8.6–10.2)
Chloride: 105 mmol/L (ref 96–106)
GFR calc Af Amer: 51 mL/min/{1.73_m2} — ABNORMAL LOW (ref >59)
GFR calc non Af Amer: 44 mL/min/{1.73_m2} — ABNORMAL LOW (ref >59)
GLUCOSE: 99 mg/dL (ref 65–99)
Potassium: 4.4 mmol/L (ref 3.5–5.2)
SODIUM: 144 mmol/L (ref 134–144)

## 2016-09-04 LAB — HEPATIC FUNCTION PANEL
ALBUMIN: 4.5 g/dL (ref 3.5–4.7)
ALK PHOS: 64 IU/L (ref 39–117)
ALT: 12 IU/L (ref 0–44)
AST: 21 IU/L (ref 0–40)
Bilirubin Total: 0.8 mg/dL (ref 0.0–1.2)
Bilirubin, Direct: 0.2 mg/dL (ref 0.00–0.40)
TOTAL PROTEIN: 6.7 g/dL (ref 6.0–8.5)

## 2016-09-04 LAB — VITAMIN D 25 HYDROXY (VIT D DEFICIENCY, FRACTURES): Vit D, 25-Hydroxy: 43.5 ng/mL (ref 30.0–100.0)

## 2016-09-04 NOTE — Patient Instructions (Addendum)
Fall Prevention in the Home Falls can cause injuries and can affect people from all age groups. There are many simple things that you can do to make your home safe and to help prevent falls. What can I do on the outside of my home?  Regularly repair the edges of walkways and driveways and fix any cracks.  Remove high doorway thresholds.  Trim any shrubbery on the main path into your home.  Use bright outdoor lighting.  Clear walkways of debris and clutter, including tools and rocks.  Regularly check that handrails are securely fastened and in good repair. Both sides of any steps should have handrails.  Install guardrails along the edges of any raised decks or porches.  Have leaves, snow, and ice cleared regularly.  Use sand or salt on walkways during winter months.  In the garage, clean up any spills right away, including grease or oil spills. What can I do in the bathroom?  Use night lights.  Install grab bars by the toilet and in the tub and shower. Do not use towel bars as grab bars.  Use non-skid mats or decals on the floor of the tub or shower.  If you need to sit down while you are in the shower, use a plastic, non-slip stool.  Keep the floor dry. Immediately clean up any water that spills on the floor.  Remove soap buildup in the tub or shower on a regular basis.  Attach bath mats securely with double-sided non-slip rug tape.  Remove throw rugs and other tripping hazards from the floor. What can I do in the bedroom?  Use night lights.  Make sure that a bedside light is easy to reach.  Do not use oversized bedding that drapes onto the floor.  Have a firm chair that has side arms to use for getting dressed.  Remove throw rugs and other tripping hazards from the floor. What can I do in the kitchen?  Clean up any spills right away.  Avoid walking on wet floors.  Place frequently used items in easy-to-reach places.  If you need to reach for something above  you, use a sturdy step stool that has a grab bar.  Keep electrical cables out of the way.  Do not use floor polish or wax that makes floors slippery. If you have to use wax, make sure that it is non-skid floor wax.  Remove throw rugs and other tripping hazards from the floor. What can I do in the stairways?  Do not leave any items on the stairs.  Make sure that there are handrails on both sides of the stairs. Fix handrails that are broken or loose. Make sure that handrails are as long as the stairways.  Check any carpeting to make sure that it is firmly attached to the stairs. Fix any carpet that is loose or worn.  Avoid having throw rugs at the top or bottom of stairways, or secure the rugs with carpet tape to prevent them from moving.  Make sure that you have a light switch at the top of the stairs and the bottom of the stairs. If you do not have them, have them installed. What are some other fall prevention tips?  Wear closed-toe shoes that fit well and support your feet. Wear shoes that have rubber soles or low heels.  When you use a stepladder, make sure that it is completely opened and that the sides are firmly locked. Have someone hold the ladder while you are using   it. Do not climb a closed stepladder.  Add color or contrast paint or tape to grab bars and handrails in your home. Place contrasting color strips on the first and last steps.  Use mobility aids as needed, such as canes, walkers, scooters, and crutches.  Turn on lights if it is dark. Replace any light bulbs that burn out.  Set up furniture so that there are clear paths. Keep the furniture in the same spot.  Fix any uneven floor surfaces.  Choose a carpet design that does not hide the edge of steps of a stairway.  Be aware of any and all pets.  Review your medicines with your healthcare provider. Some medicines can cause dizziness or changes in blood pressure, which increase your risk of falling. Talk with  your health care provider about other ways that you can decrease your risk of falls. This may include working with a physical therapist or trainer to improve your strength, balance, and endurance. This information is not intended to replace advice given to you by your health care provider. Make sure you discuss any questions you have with your health care provider. Document Released: 02/23/2002 Document Revised: 08/02/2015 Document Reviewed: 04/09/2014 Elsevier Interactive Patient Education  2017 [employer]  

## 2016-09-04 NOTE — Addendum Note (Signed)
Addended by: Zannie Cove on: 09/04/2016 10:02 AM   Modules accepted: Orders

## 2016-09-04 NOTE — Progress Notes (Signed)
Reason for visit: Gait disorder  Jon Reid is an 83 y.o. male  History of present illness:  Jon Reid is an 83 year old left handed white male with a history of a mild gait disorder. He was last seen in December 2017, he believes that his ability to ambulate has not changed much since that time. He does report some fatigue of the legs with long distance walking. He will note that his legs seem to get weak with prolonged walking. He walks 1 mile a day, sometimes he can do this without resting and sometimes he may have to rest 2 or 3 times. His stamina he claims has changed following heart surgery. The patient also has spinal stenosis in the lumbar area that may be a contributor factor. He has mild ischemia to the pontine area which may also impact balance. He has not had any falls, he may stumble on occasion. He claims that he does not use a walking stick when he is outside of the house. He returns for an evaluation.  Past Medical History:  Diagnosis Date  . Carcinoma of prostate [facility])    prostate  . CKD (chronic kidney disease) stage 2, GFR 60-89 ml/min   . Coronary artery disease    a. LHC (8/14):  3v CAD => s/p CABG (LIMA-LAD, SVG-OM1, SVG-PDA)  . GERD (gastroesophageal reflux disease)   . Gout   . HTN (hypertension)   . Hx of echocardiogram    Echocardiogram 11/13/12: EF 55-60%, normal wall motion, biatrial enlargement, mild AI, mild TR, no pericardial effusion  . Sleep apnea    Questionable    Past Surgical History:  Procedure Laterality Date  . CARDIAC CATHETERIZATION  11/07/2012   Dr Acie Fredrickson  . CORONARY ARTERY BYPASS GRAFT N/A 11/11/2012   Procedure: CORONARY ARTERY BYPASS GRAFTING times four using Right Greater Saphenous Vein Graft harvested endoscopically and Left Internal Mammary Artery.;  Surgeon: Ivin Poot, MD;  Location: [place];  Service: Open Heart Surgery;  Laterality: N/A;  . INTRAOPERATIVE TRANSESOPHAGEAL ECHOCARDIOGRAM N/A 11/11/2012   Procedure:  INTRAOPERATIVE TRANSESOPHAGEAL ECHOCARDIOGRAM;  Surgeon: Ivin Poot, MD;  Location: [place];  Service: Open Heart Surgery;  Laterality: N/A;  . LEFT HEART CATHETERIZATION WITH CORONARY ANGIOGRAM N/A 11/07/2012   Procedure: LEFT HEART CATHETERIZATION WITH CORONARY ANGIOGRAM;  Surgeon: Thayer Headings, MD;  Location: [facility] CATH LAB;  Service: Cardiovascular;  Laterality: N/A;  . LYMPH NODE DISSECTION     Bilateral pelvic  . RETROPUBIC PROSTATECTOMY     Radical    Family History  Problem Relation Age of Onset  . Aneurysm Father        Cerebral  . Stroke Father   . Hypertension Father   . Cancer Mother        colon  . Parkinson's disease Son     Social history:  reports that he has never smoked. He has never used smokeless tobacco. He reports that he drinks alcohol. He reports that he does not use drugs.    Allergies  Allergen Reactions  . Crestor [Rosuvastatin]     GYNECOMASTIA AND SWELLING    Medications:  Prior to Admission medications   Medication Sig Start Date End Date Taking? Authorizing Provider  aspirin EC 81 MG tablet Take 81 mg by mouth daily.   Yes [provider]  cholecalciferol (VITAMIN D) 1000 UNITS tablet Take 2,000 Units by mouth daily.   Yes [provider]  Cyanocobalamin (B-12 COMPLIANCE INJECTION) 1000 MCG/ML  KIT Inject as directed every 30 (thirty) days.   Yes [provider]  fluticasone Asencion Islam) 50 MCG/ACT nasal spray One to 2 sprays each nostril at bedtime 10/12/14  Yes Chipper Herb, MD  furosemide (LASIX) 40 MG tablet TAKE 1 TABLET DAILY 07/12/16  Yes Chipper Herb, MD  metoprolol succinate (TOPROL-XL) 25 MG 24 hr tablet Take 1 tablet (25 mg total) by mouth daily. 02/03/16  Yes Minus Breeding, MD  nitroGLYCERIN (NITROSTAT) 0.4 MG SL tablet Place 1 tablet (0.4 mg total) under the tongue every 5 (five) minutes as needed for chest pain. 07/20/15  Yes Kilroy, Luke K, PA-C  triamcinolone cream (KENALOG) 0.1 % Apply 1 application  topically 2 (two) times daily. 05/06/13  Yes Chipper Herb, MD    ROS:  Out of a complete 14 system review of symptoms, the patient complains only of the following symptoms, and all other reviewed systems are negative.  Leg swelling Moles  Blood pressure (!) 161/67, pulse (!) 58, weight 170 lb (77.1 kg).  Physical Exam  General: The patient is alert and cooperative at the time of the examination.  Skin: No significant peripheral edema is noted.   Neurologic Exam  Mental status: The patient is alert and oriented x 3 at the time of the examination. The patient has apparent normal recent and remote memory, with an apparently normal attention span and concentration ability.   Cranial nerves: Facial symmetry is present. Speech is normal, no aphasia or dysarthria is noted. Extraocular movements are full. Visual fields are full.  Motor: The patient has good strength in all 4 extremities.  Sensory examination: Soft touch sensation is symmetric on the face, arms, and legs.  Coordination: The patient has good finger-nose-finger and heel-to-shin bilaterally.  Gait and station: The patient has a minimally wide-based gait. Tandem gait is unsteady. Romberg is negative. No drift is seen.  Reflexes: Deep tendon reflexes are symmetric.   MRI lumbar 02/17/16:  IMPRESSION:  Abnormal MRI lumbar spine (without) demonstrating: 1. At L3-4: disc bulging, facet hypertrophy, ligamentum flavum hypertrophy, with moderate-severe spinal stenosis and severe  biforaminal stenosis. 2. At L4-5: disc bulging, facet hypertrophy, with moderate spinal stenosis and severe right and moderate left foraminal stenosis; apparent right laminectomy changes vs severe facet and posterior element arthropathy noted. 3. At L5-S1: disc bulging and facet hypertrophy with severe biforaminal stenosis. 4. At L2-3: disc bulging and facet hypertrophy with moderate biforaminal stenosis. 5. Additional degenerative changes as  above.  * MRI scan images were reviewed online. I agree with the written report.    MRI brain 12/15/15:  IMPRESSION:  This MRI of the brain without contrast shows the following: 1.    A few scattered T2/FLAIR hyperintense foci in the pons and in the hemispheres consistent with mild chronic microvascular ischemic change.   The extent is typical for age. 2.    Moderate cortical atrophy 3.    There are no acute findings.  * MRI scan images were reviewed online. I agree with the written report.    Assessment/Plan:  1. Mild gait disturbance  The patient has a multifactorial gait disorder associated with mild pontine ischemia, lumbosacral spinal stenosis, and advanced age. The patient overall is stable with his walking, I have once again recommended that he use a walking stick when he is outside the house. He will follow-up through this office on an as-needed basis, he will contact our office if he has any concerns.  Jill Alexanders MD 09/04/2016 11:06  AM  [facility] [address] [place], [place] [address]  Phone [phone] Fax [phone]

## 2016-09-06 LAB — FECAL OCCULT BLOOD, IMMUNOCHEMICAL: FECAL OCCULT BLD: NEGATIVE

## 2016-09-17 ENCOUNTER — Ambulatory Visit (INDEPENDENT_AMBULATORY_CARE_PROVIDER_SITE_OTHER): Payer: MEDICARE

## 2016-09-17 DIAGNOSIS — E538 Deficiency of other specified B group vitamins: Secondary | ICD-10-CM | POA: Diagnosis not present

## 2016-09-17 NOTE — Progress Notes (Signed)
B-12 injection given to left deltoid, patient tolerated well 

## 2016-09-17 NOTE — Patient Instructions (Signed)
Cyanocobalamin, Vitamin B12 injection What is this medicine? CYANOCOBALAMIN (sye an oh koe BAL a min) is a man made form of vitamin B12. Vitamin B12 is used in the growth of healthy blood cells, nerve cells, and proteins in the body. It also helps with the metabolism of fats and carbohydrates. This medicine is used to treat people who can not absorb vitamin B12. This medicine may be used for other purposes; ask your health care provider or pharmacist if you have questions. COMMON BRAND NAME(S): B-12 Compliance Kit, B-12 Injection Kit, Cyomin, LA-12, Nutri-Twelve, Physicians EZ Use B-12, Primabalt What should I tell my health care provider before I take this medicine? They need to know if you have any of these conditions: -kidney disease -Leber's disease -megaloblastic anemia -an unusual or allergic reaction to cyanocobalamin, cobalt, other medicines, foods, dyes, or preservatives -pregnant or trying to get pregnant -breast-feeding How should I use this medicine? This medicine is injected into a muscle or deeply under the skin. It is usually given by a health care professional in a clinic or doctor's office. However, your doctor may teach you how to inject yourself. Follow all instructions. Talk to your pediatrician regarding the use of this medicine in children. Special care may be needed. Overdosage: If you think you have taken too much of this medicine contact a poison control center or emergency room at once. NOTE: This medicine is only for you. Do not share this medicine with others. What if I miss a dose? If you are given your dose at a clinic or doctor's office, call to reschedule your appointment. If you give your own injections and you miss a dose, take it as soon as you can. If it is almost time for your next dose, take only that dose. Do not take double or extra doses. What may interact with this medicine? -colchicine -heavy alcohol intake This list may not describe all possible  interactions. Give your health care provider a list of all the medicines, herbs, non-prescription drugs, or dietary supplements you use. Also tell them if you smoke, drink alcohol, or use illegal drugs. Some items may interact with your medicine. What should I watch for while using this medicine? Visit your doctor or health care professional regularly. You may need blood work done while you are taking this medicine. You may need to follow a special diet. Talk to your doctor. Limit your alcohol intake and avoid smoking to get the best benefit. What side effects may I notice from receiving this medicine? Side effects that you should report to your doctor or health care professional as soon as possible: -allergic reactions like skin rash, itching or hives, swelling of the face, lips, or tongue -blue tint to skin -chest tightness, pain -difficulty breathing, wheezing -dizziness -red, swollen painful area on the leg Side effects that usually do not require medical attention (report to your doctor or health care professional if they continue or are bothersome): -diarrhea -headache This list may not describe all possible side effects. Call your doctor for medical advice about side effects. You may report side effects to FDA at 1-800-FDA-1088. Where should I keep my medicine? Keep out of the reach of children. Store at room temperature between 15 and 30 degrees C (59 and 85 degrees F). Protect from light. Throw away any unused medicine after the expiration date. NOTE: This sheet is a summary. It may not cover all possible information. If you have questions about this medicine, talk to your doctor, pharmacist, or   health care provider.  2018 Elsevier/Gold Standard (2007-06-16 22:10:20)  

## 2016-09-27 ENCOUNTER — Other Ambulatory Visit: Payer: Self-pay | Admitting: Cardiology

## 2016-09-27 NOTE — Telephone Encounter (Signed)
Rx(s) sent to pharmacy electronically.  

## 2016-10-19 ENCOUNTER — Ambulatory Visit (INDEPENDENT_AMBULATORY_CARE_PROVIDER_SITE_OTHER): Payer: MEDICARE | Admitting: *Deleted

## 2016-10-19 DIAGNOSIS — E538 Deficiency of other specified B group vitamins: Secondary | ICD-10-CM | POA: Diagnosis not present

## 2016-10-19 NOTE — Progress Notes (Signed)
Pt given Cyanocobalamin inj Tolerated well 

## 2016-10-29 ENCOUNTER — Encounter: Payer: Self-pay | Admitting: Cardiology

## 2016-10-30 ENCOUNTER — Other Ambulatory Visit: Payer: Self-pay | Admitting: Cardiology

## 2016-11-13 NOTE — Progress Notes (Signed)
Cardiology Office Note   Date:  11/14/2016   ID:  [name], DOB [birth year], MRN [id number]  PCP:  Chipper Herb, MD  Cardiologist:   Minus Breeding, MD  Referring:  Chipper Herb, MD  Chief Complaint  Patient presents with  . Fatigue      History of Present Illness: Jon Reid is a 83 y.o. male who presents for follow up of CAD.  He has CABG in 2014.  He presented last year with exertional chest pain.  He had a negative stress perfusion study.  Since I last saw her she has done OK from a cardiac standpoint.  He does have fatigue.  He is tired doing things and doesn't get things done like he would like.  The patient denies any new symptoms such as chest discomfort, neck or arm discomfort. There has been no new shortness of breath, PND or orthopnea. There have been no reported palpitations, presyncope or syncope.  He goes to bed at 11 and wakes up at six and is up 3 times during the day for an hour at a time.  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    Past Medical History:  Diagnosis Date  . Carcinoma of prostate [facility])    prostate  . CKD (chronic kidney disease) stage 2, GFR 60-89 ml/min   . Coronary artery disease    a. LHC (8/14):  3v CAD => s/p CABG (LIMA-LAD, SVG-OM1, SVG-PDA)  . GERD (gastroesophageal reflux disease)   . Gout   . HTN (hypertension)   . Hx of echocardiogram    Echocardiogram 11/13/12: EF 55-60%, normal wall motion, biatrial enlargement, mild AI, mild TR, no pericardial effusion  . Sleep apnea    Questionable    Past Surgical History:  Procedure Laterality Date  . CARDIAC CATHETERIZATION  11/07/2012   Dr Acie Fredrickson  . CORONARY ARTERY BYPASS GRAFT N/A 11/11/2012   Procedure: CORONARY ARTERY BYPASS GRAFTING times four using Right Greater Saphenous Vein Graft harvested endoscopically and Left Internal Mammary Artery.;  Surgeon: Ivin Poot, MD;  Location: [place];  Service: Open Heart Surgery;   Laterality: N/A;  . INTRAOPERATIVE TRANSESOPHAGEAL ECHOCARDIOGRAM N/A 11/11/2012   Procedure: INTRAOPERATIVE TRANSESOPHAGEAL ECHOCARDIOGRAM;  Surgeon: Ivin Poot, MD;  Location: [place];  Service: Open Heart Surgery;  Laterality: N/A;  . LEFT HEART CATHETERIZATION WITH CORONARY ANGIOGRAM N/A 11/07/2012   Procedure: LEFT HEART CATHETERIZATION WITH CORONARY ANGIOGRAM;  Surgeon: Thayer Headings, MD;  Location: [facility] CATH LAB;  Service: Cardiovascular;  Laterality: N/A;  . LYMPH NODE DISSECTION     Bilateral pelvic  . RETROPUBIC PROSTATECTOMY     Radical     Current Outpatient Prescriptions  Medication Sig Dispense Refill  . aspirin EC 81 MG tablet Take 81 mg by mouth daily.    . cholecalciferol (VITAMIN D) 1000 UNITS tablet Take 2,000 Units by mouth daily.    . Cyanocobalamin (B-12 COMPLIANCE INJECTION) 1000 MCG/ML KIT Inject as directed every 30 (thirty) days.    . fluticasone (FLONASE) 50 MCG/ACT nasal spray One to 2 sprays each nostril at bedtime 16 g 6  . furosemide (LASIX) 40 MG tablet Take 40 mg by mouth 3 (three) times a week.    . metoprolol succinate (TOPROL-XL) 25 MG 24 hr tablet Take 1 tablet (25 mg total) by mouth daily. 30 tablet 0  . nitroGLYCERIN (NITROSTAT) 0.4 MG SL tablet Place 1  tablet (0.4 mg total) under the tongue every 5 (five) minutes as needed for chest pain. 25 tablet PRN  . triamcinolone cream (KENALOG) 0.1 % Apply 1 application topically 2 (two) times daily. 30 g 3   Current Facility-Administered Medications  Medication Dose Route Frequency Provider Last Rate Last Dose  . cyanocobalamin ((VITAMIN B-12)) injection 1,000 mcg  1,000 mcg Intramuscular Q30 days Chipper Herb, MD   1,000 mcg at 10/19/16 1112    Allergies:   Crestor [rosuvastatin]    ROS:  Please see the history of present illness.   Otherwise, review of systems are positive for none.   All other systems are reviewed and negative.    PHYSICAL EXAM: VS:  BP 118/60   Pulse 62   Ht _0  (1.727 m)    Wt 174 lb (78.9 kg)   BMI 26.46 kg/m  , BMI Body mass index is 26.46 kg/m. GENERAL:  Well appearing HEENT:  Pupils equal round and reactive, fundi not visualized, oral mucosa unremarkable NECK:  No jugular venous distention, waveform within normal limits, carotid upstroke brisk and symmetric, no bruits, no thyromegaly LYMPHATICS:  No cervical, inguinal adenopathy LUNGS:  Clear to auscultation bilaterally BACK:  No CVA tenderness CHEST:  Well healed sternotomy scar. HEART:  PMI not displaced or sustained,S1 and S2 within normal limits, no S3, no S4, no clicks, no rubs, 2/6 apical systolic murmur, no diastolic  unds normal in frequency in pitch, no bruits, no rebound, no guarding, no midline pulsatile mass, no hepatomegaly, no splenomegaly EXT:  2 plus pulses throughout, no edema, no cyanosis no clubbing SKIN:  No rashes no nodules NEURO:  Cranial nerves II through XII grossly intact, motor grossly intact throughout PSYCH:  Cognitively intact, oriented to person place and time    EKG:  EKG is ordered today. The ekg ordered today demonstrates NSR, LAD, rate 62, axis WNL intervals WNL, no acute ST T wave changes.     Recent Labs: 11/30/2015: TSH 1.610 09/03/2016: ALT 12; BUN 23; Creatinine, Ser 1.43; Hemoglobin 11.3; Platelets 234; Potassium 4.4; Sodium 144    Lipid Panel    Component Value Date/Time   CHOL 150 09/03/2016 1220   CHOL 173 11/06/2012 1058   TRIG 62 09/03/2016 1220   TRIG 55 07/07/2015 0945   TRIG 73 11/06/2012 1058   HDL 56 09/03/2016 1220   HDL 54 07/07/2015 0945   HDL 66 11/06/2012 1058   CHOLHDL 2.7 09/03/2016 1220   CHOLHDL 2.5 11/08/2012 0500   VLDL 11 11/08/2012 0500   LDLCALC 82 09/03/2016 1220   LDLCALC 92 09/03/2013 0959   LDLCALC 92 11/06/2012 1058      Wt Readings from Last 3 Encounters:  11/14/16 174 lb (78.9 kg)  09/04/16 170 lb (77.1 kg)  09/03/16 173 lb (78.5 kg)      Other studies Reviewed: Additional studies/ records that were  reviewed today include: none . Review of the above records demonstrates:  Please see elsewhere in the note.    Lab Results  Component Value Date   TSH 1.610 11/30/2015   Lab Results  Component Value Date   CREATININE 1.43 (H) 09/03/2016   ASSESSMENT AND PLAN:   Fatigue This is the biggest complaint.  I think this is likely related to lack of sleep and stress and age.  I reviewed the labs.  I do not think that further cardiovascular testing is indicated.  We talked about de stressing his life and improving his sleep habits.  Hx of CABG He had a negative stress test last year.  He has no symptoms consistent with angina.  No change in therapy is planned.   Benign essential HTN The blood pressure is at target. No change in medications is indicated. We will continue with therapeutic lifestyle changes (TLC).  Chronic renal insufficiency, stage III (moderate) Creat has been stable at 1.43.    Myelodysplastic syndrome He has mild anemia that has been stable.  No change in therapy is indicated.   Hyperlipemia  LDL was 82.  No change in therapy.    Current medicines are reviewed at length with the patient today.  The patient does not have concerns regarding medicines.  The following changes have been made:  no change  Labs/ tests ordered today include: None  Orders Placed This Encounter  Procedures  . EKG 12-Lead     Disposition:   FU with  Me in 12 months   Signed, Minus Breeding, MD  11/14/2016 8:19 PM    [facility]

## 2016-11-14 ENCOUNTER — Ambulatory Visit (INDEPENDENT_AMBULATORY_CARE_PROVIDER_SITE_OTHER): Payer: MEDICARE | Admitting: Cardiology

## 2016-11-14 ENCOUNTER — Encounter: Payer: Self-pay | Admitting: Cardiology

## 2016-11-14 VITALS — BP 118/60 | HR 62 | Ht 68.0 in | Wt 174.0 lb

## 2016-11-14 DIAGNOSIS — I10 Essential (primary) hypertension: Secondary | ICD-10-CM | POA: Diagnosis not present

## 2016-11-14 DIAGNOSIS — I2510 Atherosclerotic heart disease of native coronary artery without angina pectoris: Secondary | ICD-10-CM | POA: Diagnosis not present

## 2016-11-14 DIAGNOSIS — R531 Weakness: Secondary | ICD-10-CM | POA: Insufficient documentation

## 2016-11-14 DIAGNOSIS — R5383 Other fatigue: Secondary | ICD-10-CM

## 2016-11-14 NOTE — Patient Instructions (Signed)

## 2016-11-20 ENCOUNTER — Ambulatory Visit (INDEPENDENT_AMBULATORY_CARE_PROVIDER_SITE_OTHER): Payer: MEDICARE | Admitting: *Deleted

## 2016-11-20 DIAGNOSIS — E538 Deficiency of other specified B group vitamins: Secondary | ICD-10-CM

## 2016-11-20 NOTE — Progress Notes (Signed)
Pt given Cyanocobalamin inj Tolerated well 

## 2016-12-05 ENCOUNTER — Emergency Department (HOSPITAL_COMMUNITY)
Admission: EM | Admit: 2016-12-05 | Discharge: 2016-12-06 | Disposition: A | Discharge status: Home / Self Care | Payer: MEDICARE | Attending: Emergency Medicine | Admitting: Emergency Medicine

## 2016-12-05 ENCOUNTER — Emergency Department (HOSPITAL_COMMUNITY): Payer: MEDICARE

## 2016-12-05 ENCOUNTER — Encounter: Payer: Self-pay | Admitting: Family Medicine

## 2016-12-05 ENCOUNTER — Encounter (HOSPITAL_COMMUNITY): Payer: Self-pay | Admitting: Emergency Medicine

## 2016-12-05 ENCOUNTER — Ambulatory Visit (INDEPENDENT_AMBULATORY_CARE_PROVIDER_SITE_OTHER): Payer: MEDICARE | Admitting: Family Medicine

## 2016-12-05 VITALS — BP 159/57 | HR 57 | Temp 97.5°F | Ht 68.0 in | Wt 173.0 lb

## 2016-12-05 DIAGNOSIS — Z951 Presence of aortocoronary bypass graft: Secondary | ICD-10-CM | POA: Insufficient documentation

## 2016-12-05 DIAGNOSIS — I129 Hypertensive chronic kidney disease with stage 1 through stage 4 chronic kidney disease, or unspecified chronic kidney disease: Secondary | ICD-10-CM | POA: Insufficient documentation

## 2016-12-05 DIAGNOSIS — Z7982 Long term (current) use of aspirin: Secondary | ICD-10-CM | POA: Insufficient documentation

## 2016-12-05 DIAGNOSIS — R1031 Right lower quadrant pain: Secondary | ICD-10-CM

## 2016-12-05 DIAGNOSIS — I2510 Atherosclerotic heart disease of native coronary artery without angina pectoris: Secondary | ICD-10-CM

## 2016-12-05 DIAGNOSIS — K439 Ventral hernia without obstruction or gangrene: Secondary | ICD-10-CM | POA: Diagnosis not present

## 2016-12-05 DIAGNOSIS — K458 Other specified abdominal hernia without obstruction or gangrene: Secondary | ICD-10-CM | POA: Diagnosis not present

## 2016-12-05 DIAGNOSIS — K4020 Bilateral inguinal hernia, without obstruction or gangrene, not specified as recurrent: Secondary | ICD-10-CM | POA: Diagnosis not present

## 2016-12-05 DIAGNOSIS — N183 Chronic kidney disease, stage 3 (moderate): Secondary | ICD-10-CM | POA: Insufficient documentation

## 2016-12-05 LAB — CBC WITH DIFFERENTIAL/PLATELET
BASOS ABS: 0.1 10*3/uL (ref 0.0–0.1)
BASOS PCT: 1 %
EOS ABS: 0.2 10*3/uL (ref 0.0–0.7)
Eosinophils Relative: 2 %
HCT: 37.6 % — ABNORMAL LOW (ref 39.0–52.0)
Hemoglobin: 12.7 g/dL — ABNORMAL LOW (ref 13.0–17.0)
Lymphocytes Relative: 32 %
Lymphs Abs: 2.2 10*3/uL (ref 0.7–4.0)
MCH: 33.1 pg (ref 26.0–34.0)
MCHC: 33.8 g/dL (ref 30.0–36.0)
MCV: 97.9 fL (ref 78.0–100.0)
Monocytes Absolute: 0.8 10*3/uL (ref 0.1–1.0)
Monocytes Relative: 12 %
Neutro Abs: 3.6 10*3/uL (ref 1.7–7.7)
Neutrophils Relative %: 53 %
PLATELETS: 218 10*3/uL (ref 150–400)
RBC: 3.84 MIL/uL — AB (ref 4.22–5.81)
RDW: 13.2 % (ref 11.5–15.5)
WBC: 6.8 10*3/uL (ref 4.0–10.5)

## 2016-12-05 LAB — COMPREHENSIVE METABOLIC PANEL
ALBUMIN: 4.4 g/dL (ref 3.5–5.0)
ALT: 15 U/L — ABNORMAL LOW (ref 17–63)
AST: 24 U/L (ref 15–41)
Alkaline Phosphatase: 63 U/L (ref 38–126)
Anion gap: 11 (ref 5–15)
BILIRUBIN TOTAL: 1.4 mg/dL — AB (ref 0.3–1.2)
BUN: 19 mg/dL (ref 6–20)
CHLORIDE: 106 mmol/L (ref 101–111)
CO2: 23 mmol/L (ref 22–32)
Calcium: 9.2 mg/dL (ref 8.9–10.3)
Creatinine, Ser: 1.3 mg/dL — ABNORMAL HIGH (ref 0.61–1.24)
GFR calc Af Amer: 56 mL/min — ABNORMAL LOW (ref >60)
GFR calc non Af Amer: 48 mL/min — ABNORMAL LOW (ref >60)
GLUCOSE: 102 mg/dL — AB (ref 65–99)
POTASSIUM: 3.5 mmol/L (ref 3.5–5.1)
SODIUM: 140 mmol/L (ref 135–145)
Total Protein: 6.9 g/dL (ref 6.5–8.1)

## 2016-12-05 MED ORDER — IOPAMIDOL (ISOVUE-300) INJECTION 61%
100.0000 mL | Freq: Once | INTRAVENOUS | Status: AC | PRN
  Administered 2016-12-05: 100 mL via INTRAVENOUS

## 2016-12-05 MED ORDER — IOPAMIDOL (ISOVUE-300) INJECTION 61%
INTRAVENOUS | Status: AC
  Filled 2016-12-05: qty 100

## 2016-12-05 MED ORDER — SODIUM CHLORIDE 0.9 % IV BOLUS (SEPSIS)
1000.0000 mL | Freq: Once | INTRAVENOUS | Status: AC
  Administered 2016-12-05: 1000 mL via INTRAVENOUS

## 2016-12-05 NOTE — ED Triage Notes (Signed)
Patient c/o right inguinal hernia that popped out this morning when patient waking up. patient reports that hernia has gone back into place and not nearly as sore as it was but has increased in size compared to what normally is.

## 2016-12-05 NOTE — Progress Notes (Signed)
Subjective:    Patient ID: Jon Reid, male    DOB: 1929/03/07, 83 y.o.   MRN: [id number]  HPI Patient here today for right side pain.The patient is complaining with a knot on his right side with pain. His vital signs are stable although his blood pressure on the systolic side is elevated. The patient has B-12 deficiency. He is followed regularly by the neurologist and has been seen by the cardiologist on a regular basis. The patient does have some moderate cortical atrophy with a mild gait disturbance. The neurologist thinks that this is associated with pontine ischemia as well as with lumbosacral spinal stenosis and advanced age. He was encouraged by the neurologist to use a walking stick regularly and to follow-up with the neurologist as needed. He also last saw his cardiologist the end of August and that report indicated a negative stress perfusion study in 2017. No change in cardiac therapy was recommended. He will follow-up with the cardiologist in one year. The patient has a large right lower quadrant hernia that is prominent and tender with standing and reduces with laying down. He's had no change in bowel habits. He has had no nausea or vomiting. The patient says that this was somewhat sore yesterday but it did become very painful today. He denies any chest pain or shortness of breath. He denies any trouble passing his water.     Patient Active Problem List   Diagnosis Date Noted  . Fatigue 11/14/2016  . Abnormality of gait 12/06/2015  . Abdominal aortic atherosclerosis ([place]) 12/01/2015  . Lumbar degenerative disc disease 12/01/2015  . Exertional angina ([place]) 07/20/2015  . Hernia of abdominal wall 10/12/2014  . Gynecomastia 04/09/2013  . Hx of CABG 02/02/2013  . 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 Myelodysplastic syndrome 11/06/2012  . Accelerating angina ([place]) 11/06/2012  . Vitamin B 12 deficiency 07/07/2012  . Hyperlipemia 07/07/2012  . Macrocytosis without anemia 01/08/2012  . PVD  04/14/2009  . CARCINOMA, PROSTATE 04/13/2009  . VASCULAR PURPURA 04/13/2009  . Benign essential HTN 04/13/2009  . Chronic renal insufficiency, stage III (moderate) 04/13/2009  . SLEEP APNEA 04/13/2009  . ELECTROCARDIOGRAM, ABNORMAL 04/13/2009   Outpatient Encounter Prescriptions as of 12/05/2016  Medication Sig  . aspirin EC 81 MG tablet Take 81 mg by mouth daily.  . cholecalciferol (VITAMIN D) 1000 UNITS tablet Take 2,000 Units by mouth daily.  . Cyanocobalamin (B-12 COMPLIANCE INJECTION) 1000 MCG/ML KIT Inject as directed every 30 (thirty) days.  . fluticasone (FLONASE) 50 MCG/ACT nasal spray One to 2 sprays each nostril at bedtime  . furosemide (LASIX) 40 MG tablet Take 40 mg by mouth 3 (three) times a week.  . metoprolol succinate (TOPROL-XL) 25 MG 24 hr tablet Take 1 tablet (25 mg total) by mouth daily.  . nitroGLYCERIN (NITROSTAT) 0.4 MG SL tablet Place 1 tablet (0.4 mg total) under the tongue every 5 (five) minutes as needed for chest pain.  Marland Kitchen triamcinolone cream (KENALOG) 0.1 % Apply 1 application topically 2 (two) times daily.   Facility-Administered Encounter Medications as of 12/05/2016  Medication  . cyanocobalamin ((VITAMIN B-12)) injection 1,000 mcg      Review of Systems  Constitutional: Negative.   HENT: Negative.   Eyes: Negative.   Respiratory: Negative.   Cardiovascular: Negative.   Gastrointestinal: Negative.        Right side pain  Endocrine: Negative.   Genitourinary: Negative.   Musculoskeletal: Negative.   Skin: Negative.   Allergic/Immunologic: Negative.  Neurological: Negative.   Hematological: Negative.   Psychiatric/Behavioral: Negative.        Objective:   Physical Exam  Constitutional: He is oriented to person, place, and time. He appears well-developed and well-nourished. No distress.  Elderly but alert and active patient with a history of heart disease and gait disturbance. The patient comes to the visit today with his wife and relates the  history well.  HENT:  Head: Normocephalic and atraumatic.  Eyes: Pupils are equal, round, and reactive to light. Conjunctivae and EOM are normal. Right eye exhibits no discharge. Left eye exhibits no discharge. No scleral icterus.  Neck: Normal range of motion. Neck supple. No thyromegaly present.  Cardiovascular: Normal rate, regular rhythm and normal heart sounds.   No murmur heard. The heart is regular at 60/m  Pulmonary/Chest: Effort normal and breath sounds normal. He has no wheezes. He has no rales.  Clear anteriorly and posteriorly  Abdominal: Soft. Bowel sounds are normal. He exhibits mass. There is tenderness. There is guarding. There is no rebound.  Genitourinary: Rectum normal.  Musculoskeletal: Normal range of motion. He exhibits no edema.  Lymphadenopathy:    He has no cervical adenopathy.  Neurological: He is alert and oriented to person, place, and time. No cranial nerve deficit.  Skin: Skin is warm and dry. No rash noted.  Psychiatric: He has a normal mood and affect. His behavior is normal. Judgment and thought content normal.  Nursing note and vitals reviewed.  BP (!) 159/57 (BP Location: Left Arm)   Pulse (!) 57   Temp (!) 97.5 F (36.4 C) (Oral)   Ht _0  (1.727 m)   Wt 173 lb (78.5 kg)   BMI 26.30 kg/m         Assessment & Plan:  1. Other specified abdominal hernia without obstruction or gangrene - Ambulatory referral to General Surgery  2. Right lower quadrant abdominal pain -Appointment with surgeon today for further evaluation and management  Patient Instructions  The patient recently had a cardiac exam and will not see the cardiologist again for a year. We will make an appointment for him to see the surgeon today because of the severity of the pain in his been having right lower quadrant.  Arrie Senate MD

## 2016-12-05 NOTE — Discharge Instructions (Signed)
You have a hernia.   You will need to see a general surgeon.  Phone number given.  Return if worse.

## 2016-12-05 NOTE — Patient Instructions (Signed)
The patient recently had a cardiac exam and will not see the cardiologist again for a year. We will make an appointment for him to see the surgeon today because of the severity of the pain in his been having right lower quadrant.

## 2016-12-05 NOTE — ED Notes (Signed)
Patient transported to CT 

## 2016-12-05 NOTE — ED Notes (Signed)
Coming from PCP-states RLQ pain, mass-states hernia-reduces when he lays down-[facility] advised PCP to come to ED for further work up, pain control

## 2016-12-08 NOTE — ED Provider Notes (Signed)
[facility] DEPT Provider Note   CSN: [id number] Arrival date & time: 12/05/16  1227     History   Chief Complaint Chief Complaint  Patient presents with  . Hernia    HPI Jon CHAMPINE is a 83 y.o. male.  Patient has had a known ventral hernia in the RLQ for a long period time. This morning the hernia appeared to get larger and was more painful.  Hernia has now reduced in size and is less painful. He is eating. No vomiting. No fever, sweats, chills. Severity of pain is moderate.      Past Medical History:  Diagnosis Date  . Carcinoma of prostate [facility])    prostate  . CKD (chronic kidney disease) stage 2, GFR 60-89 ml/min   . Coronary artery disease    a. LHC (8/14):  3v CAD => s/p CABG (LIMA-LAD, SVG-OM1, SVG-PDA)  . GERD (gastroesophageal reflux disease)   . Gout   . HTN (hypertension)   . Hx of echocardiogram    Echocardiogram 11/13/12: EF 55-60%, normal wall motion, biatrial enlargement, mild AI, mild TR, no pericardial effusion  . Sleep apnea    Questionable    Patient Active Problem List   Diagnosis Date Noted  . Fatigue 11/14/2016  . Abnormality of gait 12/06/2015  . Abdominal aortic atherosclerosis ([place]) 12/01/2015  . Lumbar degenerative disc disease 12/01/2015  . Exertional angina ([place]) 07/20/2015  . Hernia of abdominal wall 10/12/2014  . Gynecomastia 04/09/2013  . Hx of CABG 02/02/2013  . 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 Myelodysplastic syndrome 11/06/2012  . Accelerating angina ([place]) 11/06/2012  . Vitamin B 12 deficiency 07/07/2012  . Hyperlipemia 07/07/2012  . Macrocytosis without anemia 01/08/2012  . PVD 04/14/2009  . CARCINOMA, PROSTATE 04/13/2009  . VASCULAR PURPURA 04/13/2009  . Benign essential HTN 04/13/2009  . Chronic renal insufficiency, stage III (moderate) 04/13/2009  . SLEEP APNEA 04/13/2009  . ELECTROCARDIOGRAM, ABNORMAL 04/13/2009    Past Surgical History:  Procedure Laterality Date  . CARDIAC CATHETERIZATION  11/07/2012   Dr  Acie Fredrickson  . CORONARY ARTERY BYPASS GRAFT N/A 11/11/2012   Procedure: CORONARY ARTERY BYPASS GRAFTING times four using Right Greater Saphenous Vein Graft harvested endoscopically and Left Internal Mammary Artery.;  Surgeon: Ivin Poot, MD;  Location: [place];  Service: Open Heart Surgery;  Laterality: N/A;  . INTRAOPERATIVE TRANSESOPHAGEAL ECHOCARDIOGRAM N/A 11/11/2012   Procedure: INTRAOPERATIVE TRANSESOPHAGEAL ECHOCARDIOGRAM;  Surgeon: Ivin Poot, MD;  Location: [place];  Service: Open Heart Surgery;  Laterality: N/A;  . LEFT HEART CATHETERIZATION WITH CORONARY ANGIOGRAM N/A 11/07/2012   Procedure: LEFT HEART CATHETERIZATION WITH CORONARY ANGIOGRAM;  Surgeon: Thayer Headings, MD;  [facility] C[facility] LAB;  Service: Cardiovascular;  Laterality: N/A;  . LYMPH NODE DISSECTION     Bilateral pelvic  . RETROPUBIC PROSTATECTOMY     Radical       Home Medications    Prior to Admission medications   Medication Sig Start Date End Date Taking? Authorizing Provider  aspirin EC 81 MG tablet Take 81 mg by mouth daily.   Yes [provider]  cholecalciferol (VITAMIN D) 1000 UNITS tablet Take 2,000 Units by mouth daily.   Yes [provider]  Cyanocobalamin (B-12 COMPLIANCE INJECTION) 1000 MCG/ML KIT Inject as directed every 30 (thirty) days.   Yes [provider]  fluticasone Asencion Islam) 50 MCG/ACT nasal spray One to 2 sprays each nostril at bedtime 10/12/14  Yes Chipper Herb, MD  furosemide (LASIX) 40 MG tablet  Take 40 mg by mouth 3 (three) times a week.   Yes [provider]  metoprolol succinate (TOPROL-XL) 25 MG 24 hr tablet Take 1 tablet (25 mg total) by mouth daily. 10/30/16  Yes Minus Breeding, MD  nitroGLYCERIN (NITROSTAT) 0.4 MG SL tablet Place 1 tablet (0.4 mg total) under the tongue every 5 (five) minutes as needed for chest pain. 07/20/15  Yes Kilroy, Luke K, PA-C  triamcinolone cream (KENALOG) 0.1 % Apply 1 application topically 2 (two) times daily. Patient  not taking: Reported on 12/05/2016 05/06/13   Chipper Herb, MD    Family History Family History  Problem Relation Age of Onset  . Aneurysm Father        Cerebral  . Stroke Father   . Hypertension Father   . Cancer Mother        colon  . Parkinson's disease Son     Social History Social History  Substance Use Topics  . Smoking status: Never Smoker  . Smokeless tobacco: Never Used  . Alcohol use Yes     Comment: Rare     Allergies   Crestor [rosuvastatin]   Review of Systems Review of Systems  All other systems reviewed and are negative.    Physical Exam Updated Vital Signs BP (!) 147/63 (BP Location: Right Arm)   Pulse (!) 54   Temp 97.7 F (36.5 C) (Oral)   Resp 16   Ht _0  (1.753 m)   Wt 77.1 kg (170 lb)   SpO2 100%   BMI 25.10 kg/m   Physical Exam  Constitutional: He is oriented to person, place, and time. He appears well-developed and well-nourished.  HENT:  Head: Normocephalic and atraumatic.  Eyes: Conjunctivae are normal.  Neck: Neck supple.  Cardiovascular: Normal rate and regular rhythm.   Pulmonary/Chest: Effort normal and breath sounds normal.  Abdominal:  nontender hernia in the right lower quadrant approximately 3-4 cm in diameter  Musculoskeletal: Normal range of motion.  Neurological: He is alert and oriented to person, place, and time.  Skin: Skin is warm and dry.  Psychiatric: He has a normal mood and affect. His behavior is normal.  Nursing note and vitals reviewed.    ED Treatments / Results  Labs (all labs ordered are listed, but only abnormal results are displayed) Labs Reviewed  CBC WITH DIFFERENTIAL/PLATELET - Abnormal; Notable for the following:       Result Value   RBC 3.84 (*)    Hemoglobin 12.7 (*)    HCT 37.6 (*)    All other components within normal limits  COMPREHENSIVE METABOLIC PANEL - Abnormal; Notable for the following:    Glucose, Bld 102 (*)    Creatinine, Ser 1.30 (*)    ALT 15 (*)    Total  Bilirubin 1.4 (*)    GFR calc non Af Amer 48 (*)    GFR calc Af Amer 56 (*)    All other components within normal limits    EKG  EKG Interpretation None       Radiology No results found.  Procedures Procedures (including critical care time)  Medications Ordered in ED Medications  sodium chloride 0.9 % bolus 1,000 mL (0 mLs Intravenous Stopped 12/06/16 0000)  iopamidol (ISOVUE-300) 61 % injection 100 mL (100 mLs Intravenous Contrast Given 12/05/16 2255)     Initial Impression / Assessment and Plan / ED Course  I have reviewed the triage vital signs and the nursing notes.  Pertinent labs & imaging results  that were available during my care of the patient were reviewed by me and considered in my medical decision making (see chart for details).   CT of abdomen pelvis show a right inguinal hernia which contains small bowel, but no bowel obstruction or evidence of bowel wall inflammation was noted. I also discussed the cystic mass in the right abdomen. Patient will be referred to [facility] for further evaluation.    Final Clinical Impressions(s) / ED Diagnoses   Final diagnoses:  Ventral hernia without obstruction or gangrene    New Prescriptions Discharge Medication List as of 12/05/2016 11:58 PM       Nat Christen, MD 12/08/16 [phone]

## 2016-12-10 ENCOUNTER — Other Ambulatory Visit: Payer: Self-pay | Admitting: Cardiology

## 2016-12-11 DIAGNOSIS — K4090 Unilateral inguinal hernia, without obstruction or gangrene, not specified as recurrent: Secondary | ICD-10-CM | POA: Diagnosis not present

## 2016-12-17 ENCOUNTER — Telehealth: Payer: Self-pay | Admitting: *Deleted

## 2016-12-17 NOTE — Telephone Encounter (Signed)
Requesting surgical clearance:   1. Type of surgery: Right Inguinal Hernia   2. Surgeon: Dr Ileana Roup  3. Surgical date: Pending  4. Medications that need to be help: None  5. [facility]: (P) [phone] (F) [phone]   Pt saw you on 11/14/2016, Is pt cleared for surgery?

## 2016-12-17 NOTE — Telephone Encounter (Signed)
The patient is not going for a high risk surgery.   Per modified Lee criteria the estimated rate of myocardial infarction, pulmonary embolism, ventricular fibrillation, cardiac arrest or complete heart block for this patient is 0.9%.  Therefore, based on ACC/AHA guidelines, the patient would be at acceptable risk for the planned procedure without further cardiovascular testing.

## 2016-12-17 NOTE — Telephone Encounter (Signed)
Clearance faxed to [facility] via [employer]

## 2016-12-20 ENCOUNTER — Ambulatory Visit: Payer: Self-pay | Admitting: Surgery

## 2016-12-21 ENCOUNTER — Ambulatory Visit: Payer: Self-pay | Admitting: Surgery

## 2016-12-21 ENCOUNTER — Ambulatory Visit (INDEPENDENT_AMBULATORY_CARE_PROVIDER_SITE_OTHER): Payer: MEDICARE | Admitting: *Deleted

## 2016-12-21 DIAGNOSIS — Z23 Encounter for immunization: Secondary | ICD-10-CM

## 2016-12-21 DIAGNOSIS — E538 Deficiency of other specified B group vitamins: Secondary | ICD-10-CM | POA: Diagnosis not present

## 2016-12-21 NOTE — Progress Notes (Signed)
Pt given Flu vaccine and Cyanocobalamin inj Tolerated well

## 2016-12-26 ENCOUNTER — Ambulatory Visit: Payer: Self-pay | Admitting: Surgery

## 2016-12-26 NOTE — Progress Notes (Signed)
Please place consent order in EPIC orders as patient is being scheduled for a pre-op appointment! Thank you!

## 2016-12-27 ENCOUNTER — Ambulatory Visit: Payer: Self-pay | Admitting: Surgery

## 2016-12-27 NOTE — Progress Notes (Signed)
Please place consent form order in orders in EPIC as patient has a pre-op appointment on 01/02/2017! Thank you!

## 2017-01-01 NOTE — Patient Instructions (Addendum)
Jon Reid  01/01/2017   Your procedure is scheduled on: 01-10-17   Report to [facility] Main  Entrance Take [name]  Elevators to 3rd floor to  [place] at 8:30 AM.   Call this number if you have problems the morning of surgery [phone]    Remember: ONLY 1 PERSON MAY GO WITH YOU TO SHORT STAY TO GET  READY MORNING OF [place].  Do not eat food or drink liquids :After Midnight.     Take these medicines the morning of surgery with A SIP OF WATER: Metoprolol Succinate (Toprol-XL)                                You may not have any metal on your body including hair pins and              piercings  Do not wear jewelry, lotions, powders, deodorant             Men may shave face and neck.   Do not bring valuables to the hospital. [place].  Contacts, dentures or bridgework may not be worn into surgery.      Patients discharged the day of surgery will not be allowed to drive home.  Name and phone number of your driver:Jon Reid) [phone]               Please read over the following fact sheets you were given: _____________________________________________________________________             [facility] - Preparing for Surgery Before surgery, you can play an important role.  Because skin is not sterile, your skin needs to be as free of germs as possible.  You can reduce the number of germs on your skin by washing with CHG (chlorahexidine gluconate) soap before surgery.  CHG is an antiseptic cleaner which kills germs and bonds with the skin to continue killing germs even after washing. Please DO NOT use if you have an allergy to CHG or antibacterial soaps.  If your skin becomes reddened/irritated stop using the CHG and inform your nurse when you arrive at Short Stay. Do not shave (including legs and underarms) for at least 48 hours prior to the first CHG shower.  You may shave your  face/neck. Please follow these instructions carefully:  1.  Shower with CHG Soap the night before surgery and the  morning of Surgery.  2.  If you choose to wash your hair, wash your hair first as usual with your  normal  shampoo.  3.  After you shampoo, rinse your hair and body thoroughly to remove the  shampoo.                           4.  Use CHG as you would any other liquid soap.  You can apply chg directly  to the skin and wash                       Gently with a scrungie or clean washcloth.  5.  Apply the CHG Soap to your body ONLY FROM THE NECK DOWN.   Do not use on face/ open  Wound or open sores. Avoid contact with eyes, ears mouth and genitals (private parts).                       Wash face,  Genitals (private parts) with your normal soap.             6.  Wash thoroughly, paying special attention to the area where your surgery  will be performed.  7.  Thoroughly rinse your body with warm water from the neck down.  8.  DO NOT shower/wash with your normal soap after using and rinsing off  the CHG Soap.                9.  Pat yourself dry with a clean towel.            10.  Wear clean pajamas.            11.  Place clean sheets on your bed the night of your first shower and do not  sleep with pets. Day of Surgery : Do not apply any lotions/deodorants the morning of surgery.  Please wear clean clothes to the hospital/surgery center.  FAILURE TO FOLLOW THESE INSTRUCTIONS MAY RESULT IN THE CANCELLATION OF YOUR SURGERY PATIENT SIGNATURE_________________________________  NURSE SIGNATURE__________________________________  ________________________________________________________________________

## 2017-01-01 NOTE — Progress Notes (Addendum)
12-17-16 (EPIC) Cardiac clearance from Dr. Percival Spanish, cardiologist  11-14-16 [facility]) EKG, LOV  Dr. Percival Spanish, cardiologist   Anesthesiologist consult, per Dr. Orest Dikes order. Spoke to Dr. Candida Peeling, Anesthesiologist, pt can proceed with surgery.

## 2017-01-02 ENCOUNTER — Encounter (HOSPITAL_COMMUNITY): Payer: Self-pay

## 2017-01-02 ENCOUNTER — Encounter (HOSPITAL_COMMUNITY)
Admission: RE | Admit: 2017-01-02 | Discharge: 2017-01-02 | Disposition: A | Discharge status: Home / Self Care | Payer: MEDICARE | Source: Ambulatory Visit | Attending: Surgery | Admitting: Surgery

## 2017-01-02 DIAGNOSIS — Z01812 Encounter for preprocedural laboratory examination: Secondary | ICD-10-CM | POA: Insufficient documentation

## 2017-01-02 DIAGNOSIS — K4090 Unilateral inguinal hernia, without obstruction or gangrene, not specified as recurrent: Secondary | ICD-10-CM | POA: Insufficient documentation

## 2017-01-02 LAB — CBC WITH DIFFERENTIAL/PLATELET
BASOS ABS: 0 10*3/uL (ref 0.0–0.1)
Basophils Relative: 0 %
EOS ABS: 0.1 10*3/uL (ref 0.0–0.7)
EOS PCT: 2 %
HCT: 37.8 % — ABNORMAL LOW (ref 39.0–52.0)
Hemoglobin: 12.5 g/dL — ABNORMAL LOW (ref 13.0–17.0)
LYMPHS PCT: 23 %
Lymphs Abs: 1.9 10*3/uL (ref 0.7–4.0)
MCH: 33.4 pg (ref 26.0–34.0)
MCHC: 33.1 g/dL (ref 30.0–36.0)
MCV: 101.1 fL — AB (ref 78.0–100.0)
MONO ABS: 0.7 10*3/uL (ref 0.1–1.0)
Monocytes Relative: 9 %
Neutro Abs: 5.4 10*3/uL (ref 1.7–7.7)
Neutrophils Relative %: 66 %
PLATELETS: 221 10*3/uL (ref 150–400)
RBC: 3.74 MIL/uL — AB (ref 4.22–5.81)
RDW: 13.4 % (ref 11.5–15.5)
WBC: 8.2 10*3/uL (ref 4.0–10.5)

## 2017-01-02 LAB — PROTIME-INR
INR: 1
PROTHROMBIN TIME: 13.1 s (ref 11.4–15.2)

## 2017-01-02 LAB — COMPREHENSIVE METABOLIC PANEL
ALT: 22 U/L (ref 17–63)
AST: 29 U/L (ref 15–41)
Albumin: 4.2 g/dL (ref 3.5–5.0)
Alkaline Phosphatase: 63 U/L (ref 38–126)
Anion gap: 6 (ref 5–15)
BUN: 20 mg/dL (ref 6–20)
CHLORIDE: 108 mmol/L (ref 101–111)
CO2: 28 mmol/L (ref 22–32)
Calcium: 9.2 mg/dL (ref 8.9–10.3)
Creatinine, Ser: 1.47 mg/dL — ABNORMAL HIGH (ref 0.61–1.24)
GFR, EST AFRICAN AMERICAN: 48 mL/min — AB (ref >60)
GFR, EST NON AFRICAN AMERICAN: 41 mL/min — AB (ref >60)
Glucose, Bld: 104 mg/dL — ABNORMAL HIGH (ref 65–99)
POTASSIUM: 4.3 mmol/L (ref 3.5–5.1)
SODIUM: 142 mmol/L (ref 135–145)
Total Bilirubin: 1.1 mg/dL (ref 0.3–1.2)
Total Protein: 6.9 g/dL (ref 6.5–8.1)

## 2017-01-02 LAB — URINALYSIS, ROUTINE W REFLEX MICROSCOPIC
BILIRUBIN URINE: NEGATIVE
Glucose, UA: NEGATIVE mg/dL
HGB URINE DIPSTICK: NEGATIVE
KETONES UR: NEGATIVE mg/dL
Leukocytes, UA: NEGATIVE
Nitrite: NEGATIVE
PROTEIN: NEGATIVE mg/dL
Specific Gravity, Urine: 1.019 (ref 1.005–1.030)
pH: 5 (ref 5.0–8.0)

## 2017-01-07 ENCOUNTER — Encounter: Payer: Self-pay | Admitting: Family Medicine

## 2017-01-07 ENCOUNTER — Ambulatory Visit (INDEPENDENT_AMBULATORY_CARE_PROVIDER_SITE_OTHER): Payer: MEDICARE | Admitting: Family Medicine

## 2017-01-07 VITALS — BP 166/71 | HR 52 | Temp 96.9°F | Ht 71.0 in | Wt 175.0 lb

## 2017-01-07 DIAGNOSIS — I10 Essential (primary) hypertension: Secondary | ICD-10-CM

## 2017-01-07 DIAGNOSIS — K4090 Unilateral inguinal hernia, without obstruction or gangrene, not specified as recurrent: Secondary | ICD-10-CM | POA: Diagnosis not present

## 2017-01-07 DIAGNOSIS — E7800 Pure hypercholesterolemia, unspecified: Secondary | ICD-10-CM | POA: Diagnosis not present

## 2017-01-07 DIAGNOSIS — R1031 Right lower quadrant pain: Secondary | ICD-10-CM | POA: Diagnosis not present

## 2017-01-07 DIAGNOSIS — E559 Vitamin D deficiency, unspecified: Secondary | ICD-10-CM

## 2017-01-07 DIAGNOSIS — R29898 Other symptoms and signs involving the musculoskeletal system: Secondary | ICD-10-CM | POA: Diagnosis not present

## 2017-01-07 DIAGNOSIS — I2510 Atherosclerotic heart disease of native coronary artery without angina pectoris: Secondary | ICD-10-CM

## 2017-01-07 DIAGNOSIS — D469 Myelodysplastic syndrome, unspecified: Secondary | ICD-10-CM | POA: Diagnosis not present

## 2017-01-07 DIAGNOSIS — K432 Incisional hernia without obstruction or gangrene: Secondary | ICD-10-CM

## 2017-01-07 DIAGNOSIS — E538 Deficiency of other specified B group vitamins: Secondary | ICD-10-CM

## 2017-01-07 DIAGNOSIS — C61 Malignant neoplasm of prostate: Secondary | ICD-10-CM | POA: Diagnosis not present

## 2017-01-07 DIAGNOSIS — I700 Atherosclerosis of aorta: Secondary | ICD-10-CM | POA: Diagnosis not present

## 2017-01-07 NOTE — Progress Notes (Signed)
Subjective:    Patient ID: Jon Reid, male    DOB: 15-Nov-1928, 83 y.o.   MRN: [id number]  HPI Pt here for follow up and management of chronic medical problems which includes hypertension and hyperlipemia. He is taking medication regularly.  The patient is doing well other than the ongoing weakness in his lower extremities which has been worked up and evaluated by several different specialists including the cardiologist.  He does have a reducible hernia which recently became much more painful and subsequently this pain resolved.  He has surgery planned for this hernia this week.  He did see the surgeon.  Other than the leg weakness he has no other complaints.  His blood pressure continues to run high for the systolic side.  This was noted on 2 occasions today.  He will get lab work today.  Neurologist thinks that the patient's gait disturbance is multifactorial in nature.  He thinks it is associated with pontine ischemia lumbosacral spinal stenosis and advanced age.  It is important that he uses a walking stick and this will be reemphasized to him as a concern from the neurologist.  If the patient does continue to complain of leg weakness even after walking short distances.  He denies any chest pain or shortness of breath.  He denies any trouble with his stomach including nausea vomiting blood in the stool heartburn black tarry bowel movements.  The severe pain he had on a previous visit has not recurred but he is scheduled for hernia repair as a result of this.  He is currently doing fairly well as far as the pain is concerned.  He is passing his water without problems.  I re-explained to him based on the neurologist note what was causing the leg weakness and he seems to understand the spinal stenosis and central nervous system issues that could be playing a role with this.     Patient Active Problem List   Diagnosis Date Noted  . Fatigue 11/14/2016  . Abnormality of gait 12/06/2015  . Abdominal  aortic atherosclerosis ([place]) 12/01/2015  . Lumbar degenerative disc disease 12/01/2015  . Exertional angina ([place]) 07/20/2015  . Hernia of abdominal wall 10/12/2014  . Gynecomastia 04/09/2013  . Hx of CABG 02/02/2013  . 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 Myelodysplastic syndrome 11/06/2012  . Accelerating angina ([place]) 11/06/2012  . Vitamin B 12 deficiency 07/07/2012  . Hyperlipemia 07/07/2012  . Macrocytosis without anemia 01/08/2012  . PVD 04/14/2009  . CARCINOMA, PROSTATE 04/13/2009  . VASCULAR PURPURA 04/13/2009  . Benign essential HTN 04/13/2009  . Chronic renal insufficiency, stage III (moderate) ([place]) 04/13/2009  . SLEEP APNEA 04/13/2009  . ELECTROCARDIOGRAM, ABNORMAL 04/13/2009   Outpatient Encounter Prescriptions as of 01/07/2017  Medication Sig  . aspirin EC 81 MG tablet Take 81 mg by mouth daily.  . Cholecalciferol (VITAMIN D) 2000 units CAPS Take 2,000 Units by mouth daily.  . Cyanocobalamin (B-12 COMPLIANCE INJECTION) 1000 MCG/ML KIT Inject as directed every 30 (thirty) days. Gets at Dr office  . fluticasone (FLONASE) 50 MCG/ACT nasal spray One to 2 sprays each nostril at bedtime (Patient taking differently: Place 1-2 sprays into both nostrils daily as needed for allergies or rhinitis (depends on congestion if does 1-2 sprays). )  . furosemide (LASIX) 40 MG tablet Take 40 mg by mouth 3 (three) times a week.  . metoprolol succinate (TOPROL-XL) 25 MG 24 hr tablet Take 1 tablet (25 mg total) by mouth daily.  . nitroGLYCERIN (NITROSTAT)  0.4 MG SL tablet Place 1 tablet (0.4 mg total) under the tongue every 5 (five) minutes as needed for chest pain.  Marland Kitchen triamcinolone cream (KENALOG) 0.1 % Apply 1 application topically 2 (two) times daily. (Patient taking differently: Apply 1 application topically 2 (two) times daily as needed (itching). )   No facility-administered encounter medications on file as of 01/07/2017.      Review of Systems  Constitutional: Negative.   HENT: Negative.     Eyes: Negative.   Respiratory: Negative.   Cardiovascular: Negative.   Gastrointestinal: Negative.   Endocrine: Negative.   Genitourinary: Negative.   Musculoskeletal: Negative.        Leg weakness   Skin: Negative.   Allergic/Immunologic: Negative.   Neurological: Negative.   Hematological: Negative.   Psychiatric/Behavioral: Negative.        Objective:   Physical Exam  Constitutional: He is oriented to person, place, and time. He appears well-developed and well-nourished.  The patient is pleasant and alert and doing well considering his age and dealing with spinal stenosis  HENT:  Head: Normocephalic and atraumatic.  Right Ear: External ear normal.  Left Ear: External ear normal.  Nose: Nose normal.  Mouth/Throat: Oropharynx is clear and moist. No oropharyngeal exudate.  Eyes: Pupils are equal, round, and reactive to light. Conjunctivae and EOM are normal. Right eye exhibits no discharge. Left eye exhibits no discharge. No scleral icterus.  Neck: Normal range of motion. Neck supple. No thyromegaly present.  No bruits thyromegaly or anterior cervical adenopathy  Cardiovascular: Normal rate, regular rhythm, normal heart sounds and intact distal pulses.   No murmur heard. Distal pulses are palpable heart is regular at 60/min  Pulmonary/Chest: Effort normal and breath sounds normal. No respiratory distress. He has no wheezes. He has no rales. He exhibits no tenderness.  Clear anteriorly and posteriorly and no axillary adenopathy  Abdominal: Soft. Bowel sounds are normal. He exhibits no mass. There is no tenderness. There is no rebound and no guarding.  No abdominal tenderness masses or organ enlargement or bruits.  There is a right inguinal hernia still persisting and also a midline incisional hernia in the epigastric area.  Surgeries plan on the right lower quadrant hernia this week  Musculoskeletal: Normal range of motion. He exhibits no edema.  Lymphadenopathy:    He has no  cervical adenopathy.  Neurological: He is alert and oriented to person, place, and time. He has normal reflexes. No cranial nerve deficit.  Skin: Skin is warm and dry. No rash noted.  Psychiatric: He has a normal mood and affect. His behavior is normal. Judgment and thought content normal.  Nursing note and vitals reviewed.  BP (!) 166/71   Pulse (!) 52   Temp (!) 96.9 F (36.1 C) (Oral)   Ht 5' 11"  (1.803 m)   Wt 175 lb (79.4 kg)   BMI 24.41 kg/m         Assessment & Plan:  1. Benign essential HTN -This patient's blood pressure is always elevated in the office and no further changes will be made with his medicine here he says the numbers are much better at home.  The cardiologist is also aware of this. - CBC with Differential/Platelet - BMP8+EGFR - Hepatic function panel  2. Vitamin D deficiency -Continue with vitamin D replacement pending results of lab work - CBC with Differential/Platelet - VITAMIN D 25 Hydroxy (Vit-D Deficiency, Fractures)  3. Pure hypercholesterolemia -The patient has been intolerant to cholesterol medicine in the  past and we will continue with his aggressive therapeutic lifestyle changes as possible - CBC with Differential/Platelet - Lipid panel  4. Vitamin B 12 deficiency -Continue with vitamin D replacement - CBC with Differential/Platelet - Vitamin B12  5. Prostate cancer [facility]) -Follow-up with urology as planned - CBC with Differential/Platelet  6. Weakness of both lower extremities -The neurologist thinks this is multifactorial due to the pontine issues and spinal stenosis. - CBC with Differential/Platelet - Thyroid Panel With TSH - Vitamin B12  7. Right lower quadrant abdominal pain -This is due to an inguinal hernia which will be repaired this coming Thursday.  8. Myelodysplastic syndrome -Continue to follow-up with hematology as planned continue with as aggressive therapeutic lifestyle changes as possible  9. Abdominal aortic  atherosclerosis ([place]) -Continue with as aggressive therapeutic lifestyle changes as doing.  10. Right inguinal hernia -Follow-up with surgeon as planned  11. Incisional hernia, without obstruction or gangrene -Continue to monitor this and currently patient is having no pain with this.  Patient Instructions                       Medicare Annual Wellness Visit  [facility] and the medical providers at [facility] strive to bring you the best medical care.  In doing so we not only want to address your current medical conditions and concerns but also to detect new conditions early and prevent illness, disease and health-related problems.    Medicare offers a yearly Wellness Visit which allows our clinical staff to assess your need for preventative services including immunizations, lifestyle education, counseling to decrease risk of preventable diseases and screening for fall risk and other medical concerns.    This visit is provided free of charge (no copay) for all Medicare recipients. The clinical pharmacists at [facility] have begun to conduct these Wellness Visits which will also include a thorough review of all your medications.    As you primary medical provider recommend that you make an appointment for your Annual Wellness Visit if you have not done so already this year.  You may set up this appointment before you leave today or you may call back ([phone]) and schedule an appointment.  Please make sure when you call that you mention that you are scheduling your Annual Wellness Visit with the clinical pharmacist so that the appointment may be made for the proper length of time.    Continue current medications. Continue good therapeutic lifestyle changes which include good diet and exercise. Fall precautions discussed with patient. If an FOBT was given today- please return it to our front desk. If you are over 31 years old - you may need  Prevnar 60 or the adult Pneumonia vaccine.  **Flu shots are available--- please call and schedule a FLU-CLINIC appointment**  After your visit with [name] today you will receive a survey in the mail or online from [employer] regarding your care with [name]. Please take a moment to fill this out. Your feedback is very important to [name] as you can help [name] better understand your patient needs as well as improve your experience and satisfaction. WE CARE ABOUT YOU!!!  Watch sodium intake Continue to monitor blood pressure at home Follow through with surgery as planned Use walking stick as recommended by the neurologist Stay as active physically as possible and do not do any climbing   Arrie Senate MD

## 2017-01-07 NOTE — Patient Instructions (Addendum)
Medicare Annual Wellness Visit  [facility] and the medical providers at [facility] strive to bring you the best medical care.  In doing so we not only want to address your current medical conditions and concerns but also to detect new conditions early and prevent illness, disease and health-related problems.    Medicare offers a yearly Wellness Visit which allows our clinical staff to assess your need for preventative services including immunizations, lifestyle education, counseling to decrease risk of preventable diseases and screening for fall risk and other medical concerns.    This visit is provided free of charge (no copay) for all Medicare recipients. The clinical pharmacists at [facility] have begun to conduct these Wellness Visits which will also include a thorough review of all your medications.    As you primary medical provider recommend that you make an appointment for your Annual Wellness Visit if you have not done so already this year.  You may set up this appointment before you leave today or you may call back [phone]) and schedule an appointment.  Please make sure when you call that you mention that you are scheduling your Annual Wellness Visit with the clinical pharmacist so that the appointment may be made for the proper length of time.    Continue current medications. Continue good therapeutic lifestyle changes which include good diet and exercise. Fall precautions discussed with patient. If an FOBT was given today- please return it to our front desk. If you are over 81 years old - you may need Prevnar 59 or the adult Pneumonia vaccine.  **Flu shots are available--- please call and schedule a FLU-CLINIC appointment**  After your visit with [name] today you will receive a survey in the mail or online from [employer] regarding your care with [name]. Please take a moment to fill this out. Your feedback is very  important to [name] as you can help [name] better understand your patient needs as well as improve your experience and satisfaction. WE CARE ABOUT YOU!!!  Watch sodium intake Continue to monitor blood pressure at home Follow through with surgery as planned Use walking stick as recommended by the neurologist Stay as active physically as possible and do not do any climbing

## 2017-01-08 LAB — CBC WITH DIFFERENTIAL/PLATELET
BASOS ABS: 0 10*3/uL (ref 0.0–0.2)
Basos: 0 %
EOS (ABSOLUTE): 0.1 10*3/uL (ref 0.0–0.4)
EOS: 2 %
HEMATOCRIT: 40.1 % (ref 37.5–51.0)
HEMOGLOBIN: 13 g/dL (ref 13.0–17.7)
IMMATURE GRANS (ABS): 0 10*3/uL (ref 0.0–0.1)
Immature Granulocytes: 0 %
LYMPHS ABS: 2.8 10*3/uL (ref 0.7–3.1)
LYMPHS: 39 %
MCH: 33.8 pg — ABNORMAL HIGH (ref 26.6–33.0)
MCHC: 32.4 g/dL (ref 31.5–35.7)
MCV: 104 fL — ABNORMAL HIGH (ref 79–97)
MONOCYTES: 11 %
Monocytes Absolute: 0.8 10*3/uL (ref 0.1–0.9)
Neutrophils Absolute: 3.5 10*3/uL (ref 1.4–7.0)
Neutrophils: 48 %
Platelets: 268 10*3/uL (ref 150–379)
RBC: 3.85 x10E6/uL — AB (ref 4.14–5.80)
RDW: 14.3 % (ref 12.3–15.4)
WBC: 7.3 10*3/uL (ref 3.4–10.8)

## 2017-01-08 LAB — BMP8+EGFR
BUN/Creatinine Ratio: 14 (ref 10–24)
BUN: 19 mg/dL (ref 8–27)
CALCIUM: 9.4 mg/dL (ref 8.6–10.2)
CHLORIDE: 104 mmol/L (ref 96–106)
CO2: 23 mmol/L (ref 20–29)
CREATININE: 1.34 mg/dL — AB (ref 0.76–1.27)
GFR calc Af Amer: 55 mL/min/{1.73_m2} — ABNORMAL LOW (ref >59)
GFR, EST NON AFRICAN AMERICAN: 48 mL/min/{1.73_m2} — AB (ref >59)
GLUCOSE: 100 mg/dL — AB (ref 65–99)
POTASSIUM: 4.6 mmol/L (ref 3.5–5.2)
SODIUM: 141 mmol/L (ref 134–144)

## 2017-01-08 LAB — VITAMIN D 25 HYDROXY (VIT D DEFICIENCY, FRACTURES): Vit D, 25-Hydroxy: 47.1 ng/mL (ref 30.0–100.0)

## 2017-01-08 LAB — HEPATIC FUNCTION PANEL
ALK PHOS: 71 IU/L (ref 39–117)
ALT: 15 IU/L (ref 0–44)
AST: 22 IU/L (ref 0–40)
Albumin: 4.3 g/dL (ref 3.5–4.7)
BILIRUBIN, DIRECT: 0.19 mg/dL (ref 0.00–0.40)
Bilirubin Total: 0.6 mg/dL (ref 0.0–1.2)
Total Protein: 6.5 g/dL (ref 6.0–8.5)

## 2017-01-08 LAB — LIPID PANEL
CHOL/HDL RATIO: 2.4 ratio (ref 0.0–5.0)
Cholesterol, Total: 144 mg/dL (ref 100–199)
HDL: 61 mg/dL (ref >39)
LDL Calculated: 73 mg/dL (ref 0–99)
TRIGLYCERIDES: 49 mg/dL (ref 0–149)
VLDL Cholesterol Cal: 10 mg/dL (ref 5–40)

## 2017-01-08 LAB — VITAMIN B12: VITAMIN B 12: 886 pg/mL (ref 232–1245)

## 2017-01-08 LAB — THYROID PANEL WITH TSH
FREE THYROXINE INDEX: 1.6 (ref 1.2–4.9)
T3 UPTAKE RATIO: 26 % (ref 24–39)
T4 TOTAL: 6 ug/dL (ref 4.5–12.0)
TSH: 1.5 u[IU]/mL (ref 0.450–4.500)

## 2017-01-10 ENCOUNTER — Ambulatory Visit (HOSPITAL_COMMUNITY): Payer: MEDICARE | Admitting: Anesthesiology

## 2017-01-10 ENCOUNTER — Encounter (HOSPITAL_COMMUNITY)
Admission: RE | Disposition: A | Discharge status: Home / Self Care | Payer: Self-pay | Source: Ambulatory Visit | Attending: Surgery

## 2017-01-10 ENCOUNTER — Encounter (HOSPITAL_COMMUNITY): Payer: Self-pay | Admitting: *Deleted

## 2017-01-10 ENCOUNTER — Ambulatory Visit (HOSPITAL_COMMUNITY)
Admission: RE | Admit: 2017-01-10 | Discharge: 2017-01-10 | Disposition: A | Discharge status: Home / Self Care | Payer: MEDICARE | Source: Ambulatory Visit | Attending: Surgery | Admitting: Surgery

## 2017-01-10 DIAGNOSIS — Z7982 Long term (current) use of aspirin: Secondary | ICD-10-CM | POA: Diagnosis not present

## 2017-01-10 DIAGNOSIS — E785 Hyperlipidemia, unspecified: Secondary | ICD-10-CM | POA: Diagnosis not present

## 2017-01-10 DIAGNOSIS — G4730 Sleep apnea, unspecified: Secondary | ICD-10-CM | POA: Insufficient documentation

## 2017-01-10 DIAGNOSIS — I10 Essential (primary) hypertension: Secondary | ICD-10-CM | POA: Insufficient documentation

## 2017-01-10 DIAGNOSIS — Z8546 Personal history of malignant neoplasm of prostate: Secondary | ICD-10-CM | POA: Diagnosis not present

## 2017-01-10 DIAGNOSIS — Z79899 Other long term (current) drug therapy: Secondary | ICD-10-CM | POA: Diagnosis not present

## 2017-01-10 DIAGNOSIS — I2510 Atherosclerotic heart disease of native coronary artery without angina pectoris: Secondary | ICD-10-CM | POA: Diagnosis not present

## 2017-01-10 DIAGNOSIS — K219 Gastro-esophageal reflux disease without esophagitis: Secondary | ICD-10-CM | POA: Diagnosis not present

## 2017-01-10 DIAGNOSIS — N183 Chronic kidney disease, stage 3 (moderate): Secondary | ICD-10-CM | POA: Diagnosis not present

## 2017-01-10 DIAGNOSIS — Z951 Presence of aortocoronary bypass graft: Secondary | ICD-10-CM | POA: Diagnosis not present

## 2017-01-10 DIAGNOSIS — K4090 Unilateral inguinal hernia, without obstruction or gangrene, not specified as recurrent: Secondary | ICD-10-CM | POA: Diagnosis not present

## 2017-01-10 DIAGNOSIS — I120 Hypertensive chronic kidney disease with stage 5 chronic kidney disease or end stage renal disease: Secondary | ICD-10-CM | POA: Diagnosis not present

## 2017-01-10 HISTORY — PX: INGUINAL HERNIA REPAIR: SHX194

## 2017-01-10 HISTORY — PX: INSERTION OF MESH: SHX5868

## 2017-01-10 SURGERY — REPAIR, HERNIA, INGUINAL, ADULT
Anesthesia: General | Laterality: Right

## 2017-01-10 MED ORDER — HYDROMORPHONE HCL 1 MG/ML IJ SOLN: 0.2500 mg | INTRAMUSCULAR | Status: DC | PRN

## 2017-01-10 MED ORDER — CEFAZOLIN SODIUM-DEXTROSE 2-4 GM/100ML-% IV SOLN: 2.0000 g | INTRAVENOUS | Status: DC

## 2017-01-10 MED ORDER — PROPOFOL 10 MG/ML IV BOLUS
INTRAVENOUS | Status: DC | PRN
Start: 2017-01-10 — End: 2017-01-10
  Administered 2017-01-10: 140 mg via INTRAVENOUS

## 2017-01-10 MED ORDER — CHLORHEXIDINE GLUCONATE CLOTH 2 % EX PADS: 6.0000 | MEDICATED_PAD | Freq: Once | CUTANEOUS | Status: DC

## 2017-01-10 MED ORDER — DEXAMETHASONE SODIUM PHOSPHATE 10 MG/ML IJ SOLN
INTRAMUSCULAR | Status: AC
  Filled 2017-01-10: qty 1

## 2017-01-10 MED ORDER — GABAPENTIN 300 MG PO CAPS
300.0000 mg | ORAL_CAPSULE | ORAL | Status: AC
  Administered 2017-01-10: 300 mg via ORAL
  Filled 2017-01-10: qty 1

## 2017-01-10 MED ORDER — ROCURONIUM BROMIDE 10 MG/ML (PF) SYRINGE
PREFILLED_SYRINGE | INTRAVENOUS | Status: DC | PRN
  Administered 2017-01-10: 40 mg via INTRAVENOUS

## 2017-01-10 MED ORDER — ROCURONIUM BROMIDE 50 MG/5ML IV SOSY
PREFILLED_SYRINGE | INTRAVENOUS | Status: AC
  Filled 2017-01-10: qty 5

## 2017-01-10 MED ORDER — EPHEDRINE 5 MG/ML INJ
INTRAVENOUS | Status: AC
  Filled 2017-01-10: qty 10

## 2017-01-10 MED ORDER — ONDANSETRON HCL 4 MG/2ML IJ SOLN
INTRAMUSCULAR | Status: AC
  Filled 2017-01-10: qty 2

## 2017-01-10 MED ORDER — 0.9 % SODIUM CHLORIDE (POUR BTL) OPTIME
TOPICAL | Status: DC | PRN
  Administered 2017-01-10: 1000 mL

## 2017-01-10 MED ORDER — ACETAMINOPHEN 500 MG PO TABS: 1000.0000 mg | ORAL_TABLET | ORAL | Status: DC

## 2017-01-10 MED ORDER — SUCCINYLCHOLINE CHLORIDE 200 MG/10ML IV SOSY
PREFILLED_SYRINGE | INTRAVENOUS | Status: DC | PRN
  Administered 2017-01-10: 100 mg via INTRAVENOUS

## 2017-01-10 MED ORDER — LIDOCAINE 2% (20 MG/ML) 5 ML SYRINGE
INTRAMUSCULAR | Status: DC | PRN
  Administered 2017-01-10: 100 mg via INTRAVENOUS

## 2017-01-10 MED ORDER — LACTATED RINGERS IV SOLN
INTRAVENOUS | Status: DC
  Administered 2017-01-10 (×2): via INTRAVENOUS

## 2017-01-10 MED ORDER — BUPIVACAINE LIPOSOME 1.3 % IJ SUSP
20.0000 mL | Freq: Once | INTRAMUSCULAR | Status: AC
  Administered 2017-01-10: 20 mL
  Filled 2017-01-10: qty 20

## 2017-01-10 MED ORDER — OXYCODONE HCL 5 MG PO TABS: 5.0000 mg | ORAL_TABLET | Freq: Four times a day (QID) | ORAL | 0 refills | Status: AC | PRN

## 2017-01-10 MED ORDER — BUPIVACAINE HCL (PF) 0.5 % IJ SOLN
INTRAMUSCULAR | Status: DC | PRN
  Administered 2017-01-10: 20 mL

## 2017-01-10 MED ORDER — ACETAMINOPHEN 500 MG PO TABS
1000.0000 mg | ORAL_TABLET | ORAL | Status: AC
Start: 2017-01-10 — End: 2017-01-10
  Administered 2017-01-10: 1000 mg via ORAL
  Filled 2017-01-10: qty 2

## 2017-01-10 MED ORDER — CEFAZOLIN SODIUM-DEXTROSE 2-4 GM/100ML-% IV SOLN
2.0000 g | INTRAVENOUS | Status: AC
  Administered 2017-01-10: 2 g via INTRAVENOUS
  Filled 2017-01-10: qty 100

## 2017-01-10 MED ORDER — DEXAMETHASONE SODIUM PHOSPHATE 10 MG/ML IJ SOLN
INTRAMUSCULAR | Status: DC | PRN
  Administered 2017-01-10: 10 mg via INTRAVENOUS

## 2017-01-10 MED ORDER — EPHEDRINE SULFATE-NACL 50-0.9 MG/10ML-% IV SOSY
PREFILLED_SYRINGE | INTRAVENOUS | Status: DC | PRN
  Administered 2017-01-10: 5 mg via INTRAVENOUS

## 2017-01-10 MED ORDER — PROMETHAZINE HCL 25 MG/ML IJ SOLN: 6.2500 mg | INTRAMUSCULAR | Status: DC | PRN

## 2017-01-10 MED ORDER — FENTANYL CITRATE (PF) 100 MCG/2ML IJ SOLN
INTRAMUSCULAR | Status: AC
  Filled 2017-01-10: qty 2

## 2017-01-10 MED ORDER — FENTANYL CITRATE (PF) 100 MCG/2ML IJ SOLN
INTRAMUSCULAR | Status: DC | PRN
  Administered 2017-01-10: 25 ug via INTRAVENOUS

## 2017-01-10 MED ORDER — LIDOCAINE 2% (20 MG/ML) 5 ML SYRINGE
INTRAMUSCULAR | Status: AC
  Filled 2017-01-10: qty 5

## 2017-01-10 MED ORDER — ONDANSETRON HCL 4 MG/2ML IJ SOLN
INTRAMUSCULAR | Status: DC | PRN
Start: 2017-01-10 — End: 2017-01-10
  Administered 2017-01-10: 4 mg via INTRAVENOUS

## 2017-01-10 MED ORDER — MEPERIDINE HCL 50 MG/ML IJ SOLN: 6.2500 mg | INTRAMUSCULAR | Status: DC | PRN

## 2017-01-10 MED ORDER — SUGAMMADEX SODIUM 200 MG/2ML IV SOLN
INTRAVENOUS | Status: AC
  Filled 2017-01-10: qty 2

## 2017-01-10 MED ORDER — BUPIVACAINE-EPINEPHRINE (PF) 0.25% -1:200000 IJ SOLN
INTRAMUSCULAR | Status: AC
  Filled 2017-01-10: qty 30

## 2017-01-10 MED ORDER — SUGAMMADEX SODIUM 200 MG/2ML IV SOLN
INTRAVENOUS | Status: DC | PRN
  Administered 2017-01-10: 100 mg via INTRAVENOUS

## 2017-01-10 MED ORDER — PROPOFOL 10 MG/ML IV BOLUS
INTRAVENOUS | Status: AC
  Filled 2017-01-10: qty 20

## 2017-01-10 SURGICAL SUPPLY — 35 items
ADH SKN CLS APL DERMABOND .7 (GAUZE/BANDAGES/DRESSINGS) ×1
BLADE SURG 15 STRL LF DISP TIS (BLADE) ×1 IMPLANT
BLADE SURG 15 STRL SS (BLADE) ×2
CHLORAPREP W/TINT 26ML (MISCELLANEOUS) ×2 IMPLANT
DECANTER SPIKE VIAL GLASS SM (MISCELLANEOUS) ×1 IMPLANT
DERMABOND ADVANCED (GAUZE/BANDAGES/DRESSINGS) ×1
DERMABOND ADVANCED .7 DNX12 (GAUZE/BANDAGES/DRESSINGS) ×1 IMPLANT
DRAIN PENROSE 18X1/2 LTX STRL (DRAIN) ×2 IMPLANT
DRAPE LAPAROTOMY TRNSV 102X78 (DRAPE) ×2 IMPLANT
ELECT PENCIL ROCKER SW 15FT (MISCELLANEOUS) ×2 IMPLANT
ELECT REM PT RETURN 15FT ADLT (MISCELLANEOUS) ×2 IMPLANT
GAUZE SPONGE 4X4 12PLY STRL (GAUZE/BANDAGES/DRESSINGS) IMPLANT
GLOVE SURG SIGNA 7.5 PF LTX (GLOVE) ×2 IMPLANT
GOWN STRL REUS W/TWL XL LVL3 (GOWN DISPOSABLE) ×4 IMPLANT
KIT BASIN OR (CUSTOM PROCEDURE TRAY) ×2 IMPLANT
MESH ULTRAPRO 3X6 7.6X15CM (Mesh General) ×1 IMPLANT
NDL HYPO 25X1 1.5 SAFETY (NEEDLE) ×1 IMPLANT
NEEDLE HYPO 25X1 1.5 SAFETY (NEEDLE) ×2 IMPLANT
PACK BASIC VI WITH GOWN DISP (CUSTOM PROCEDURE TRAY) ×2 IMPLANT
SPONGE LAP 4X18 X RAY DECT (DISPOSABLE) ×2 IMPLANT
SUT ETHIBOND 0 MO6 C/R (SUTURE) ×2 IMPLANT
SUT MNCRL AB 4-0 PS2 18 (SUTURE) ×2 IMPLANT
SUT SILK 2 0 SH (SUTURE) IMPLANT
SUT VIC AB 0 CT1 27 (SUTURE) ×2
SUT VIC AB 0 CT1 27XBRD ANTBC (SUTURE) IMPLANT
SUT VIC AB 2-0 CT1 27 (SUTURE) ×2
SUT VIC AB 2-0 CT1 TAPERPNT 27 (SUTURE) IMPLANT
SUT VIC AB 3-0 54XBRD REEL (SUTURE) IMPLANT
SUT VIC AB 3-0 BRD 54 (SUTURE)
SUT VIC AB 3-0 SH 27 (SUTURE) ×2
SUT VIC AB 3-0 SH 27XBRD (SUTURE) IMPLANT
SYR CONTROL 10ML LL (SYRINGE) ×2 IMPLANT
TOWEL OR 17X26 10 PK STRL BLUE (TOWEL DISPOSABLE) ×2 IMPLANT
TOWEL OR NON WOVEN STRL DISP B (DISPOSABLE) ×2 IMPLANT
YANKAUER SUCT BULB TIP 10FT TU (MISCELLANEOUS) IMPLANT

## 2017-01-10 NOTE — H&P (Signed)
CC: Here for surgery  HPI Patient words: Mr. Ramirez is an 83 year old male referred here from his primary care physician for evaluation of a right inguinal hernia. He reports having noticed a bulge in the right groin area for the last 10 years.  This was always pain-free until approximately 1-2 weeks ago when he noticed that the bulge was firm and difficult to reduce.  It was uncomfortable.  He take it easy for a couple of hours and the pain subsided.  He was seen by his primary care physician and referred to the emergency room for further evaluation.  In the emergency room he had a CT scan done which demonstrated a right inguinal hernia that contained bowel.  It was reducible and so he was discharged with surgical follow-up. Since that time he has had no pain and the bulge is always reducible.  He denies fever, chills, nausea, vomiting, or changes in bowel habits.  He has no pain with activity.  He is comfortably able to ascend 2 flights of stairs without stopping and denies any issues with chest pain or shortness of breath.  Past medical history: CABG- 4 vessel 5 years ago.  Denies having ever had a heart attack or stroke.  Past surgical history: Prostatectomy approximately 13 years ago; CABG 5 years ago  Social history: Denies use of tobacco, alcohol, drugs  Family history: Denies family history of malignancy.  The patient is a 83 year old male.   Past Surgical History Prostate Surgery - Removal    Allergies Crestor *ANTIHYPERLIPIDEMICS*   Allergies Reconciled    Medication History Aspirin  (81MG  Tablet DR, Oral) Active. Cyanocobalamin  (1000MCG/ML Solution, Injection) Active. Lasix  (40MG  Tablet, Oral) Active. Metoprolol Tartrate  (25MG  Tablet, Oral) Active. Nitroglycerin  (0.4MG  Tab Sublingual, Sublingual) Active. Flonase  (50MCG/ACT Suspension, Nasal) Active. Furosemide  (40MG  Tablet, Oral) Active. Triamcinolone Acetonide  (0.1% Cream, External) Active. Cymbalta  (30MG  Capsule  DR Part, Oral) Active. Vitamin D (Cholecalciferol)  (1000UNIT Capsule, Oral) Active. Medications Reconciled   Social History Alcohol use   Moderate alcohol use. Caffeine use   Carbonated beverages, Coffee. No drug use   Tobacco use   Never smoker.  Family History Alean Rinne, [place]; 12/11/2016 10:36 AM) Cerebrovascular Accident   Father. Colon Cancer   Mother. Hypertension   Father.  Other Problems Heart murmur   Prostate Cancer    Review of Systems General Not Present- Anorexia and Appetite Loss. Skin Present- Dryness. Not Present- Change in Wart/Mole, Hives, Jaundice, New Lesions, Non-Healing Wounds, Rash and Ulcer. HEENT Present- Hoarseness and Seasonal Allergies. Not Present- Earache, Hearing Loss, Nose Bleed, Oral Ulcers, Ringing in the Ears, Sinus Pain, Sore Throat, Visual Disturbances, Wears glasses/contact lenses and Yellow Eyes. Neck Not Present- Neck Mass and Neck Pain. Respiratory Not Present- Decreased Exercise Tolerance and Difficulty Breathing. Cardiovascular Not Present- Difficulty Breathing Lying Down, Difficulty Breathing On Exertion and Edema. Gastrointestinal Note:  As per HPI Male Genitourinary Not Present- Hematuria and Urgency. Musculoskeletal Not Present- Back Pain and Claudication. Neurological Present- Trouble walking and Weakness. Not Present- Decreased Memory, Fainting, Headaches, Numbness, Seizures, Tingling and Tremor. Psychiatric Not Present- Anxiety and Depression. Endocrine Not Present- Appetite Changes and Cold Intolerance. Hematology Present- Blood Thinners and Easy Bruising. Not Present- Excessive bleeding, Gland problems, HIV and Persistent Infections.  There were no vitals filed for this visit.   Physical Exam General Note:  No acute distress, conversant   Head and Neck Note:  Trachea midline, no palpable thyromegaly   Eye  Note:  Conjunctiva moist; no lid lag   Chest and Lung Exam Note:  Normal work of breathing, lungs are clear  to auscultation bilaterally   Cardiovascular Note:  Regular rate and rhythm, no murmurs   Abdomen Note:  Abdomen is soft, nontender, nondistended. No palpable hepatosplenomegaly. Freely reducible right inguinal hernia, no scrotal component. No palpable left inguinal hernia   Male Genitourinary Penis - Normal. Scrotum - Non Tender, No Hydrocele, No Masses.  Neuropsychiatric The patient's mood and affect are described as  - normal. Associations - intact. Judgment and Insight - insight is appropriate concerning matters relevant to self.  Lymphatic Note:  No lower extremity pitting edema; no palpable extremity lymphadenopathy     Assessment & Plan INGUINAL HERNIA, RIGHT (K40.90) Impression: Mr. Binette is an 83yr old male with a symptomatic reducible right inguinal hernia -The anatomy and physiology of the inguinal region was discussed. An information pamphlet was provided about inguinal hernias. We discussed the pathology of inguinal hernias. We discussed that once patient's develop symptoms from an inguinal hernia there at a higher risk of downstream complications and therefore I recommended to undergo surgery. We discussed the use of mesh in inguinal hernia repairs. -The procedure, material risks (including, but not limited to, pain which can become chronic, bleeding, infection, scarring, recurrence, damage to surrounding structures, need for additional procedures, ischemic orchitis, mesh complications, loss of testicle, heart attack, stroke, death), benefits, and alternatives to surgery were discussed in detail. His questions were answered to his satisfaction and he elected to proceed with surgery -He was cleared for surgery by his cardiologist  Sharon Mt. Dema Severin, M.D. General and Colorectal Surgery [facility]

## 2017-01-10 NOTE — Anesthesia Procedure Notes (Signed)
Procedure Name: Intubation Date/Time: 01/10/2017 10:30 AM Performed by: Lind Covert Pre-anesthesia Checklist: Patient identified, Emergency Drugs available, Suction available and Patient being monitored Patient Re-evaluated:Patient Re-evaluated prior to induction Oxygen Delivery Method: Circle system utilized Preoxygenation: Pre-oxygenation with 100% oxygen Induction Type: IV induction Ventilation: Mask ventilation without difficulty Laryngoscope Size: 4 and Mac Grade View: Grade II Tube type: Oral Tube size: 7.5 mm Number of attempts: 1 Airway Equipment and Method: Stylet Placement Confirmation: ETT inserted through vocal cords under direct vision,  positive ETCO2 and breath sounds checked- equal and bilateral Secured at: 22 cm Dental Injury: Teeth and Oropharynx as per pre-operative assessment

## 2017-01-10 NOTE — Anesthesia Preprocedure Evaluation (Signed)
Anesthesia Evaluation  Patient identified by MRN, date of birth, ID band Patient awake    Reviewed: Allergy & Precautions, NPO status , Patient's Chart, lab work & pertinent test results, reviewed documented beta blocker date and time   Airway Mallampati: II  TM Distance: >3 FB Neck ROM: Full    Dental  (+) Partial Upper, Partial Lower, Caps, Chipped,    Pulmonary sleep apnea ,    Pulmonary exam normal breath sounds clear to auscultation       Cardiovascular hypertension, Pt. on medications and Pt. on home beta blockers + angina with exertion + CAD, + CABG and + Peripheral Vascular Disease  Normal cardiovascular exam Rhythm:Regular Rate:Normal     Neuro/Psych negative neurological ROS  negative psych ROS   GI/Hepatic Neg liver ROS, GERD  Medicated and Controlled,  Endo/Other  Hyperlipidemia  Renal/GU Renal InsufficiencyRenal disease   Prostate Ca s/p prostatectomy    Musculoskeletal  (+) Arthritis , Osteoarthritis,    Abdominal   Peds  Hematology Myelodysplastic syndrome   Anesthesia Other Findings   Reproductive/Obstetrics                             Anesthesia Physical Anesthesia Plan  ASA: III  Anesthesia Plan: General   Post-op Pain Management:    Induction: Intravenous  PONV Risk Score and Plan: 4 or greater and Ondansetron, Dexamethasone, Midazolam, Propofol infusion and Promethazine  Airway Management Planned: LMA and Oral ETT  Additional Equipment:   Intra-op Plan:   Post-operative Plan: Extubation in OR  Informed Consent: I have reviewed the patients History and Physical, chart, labs and discussed the procedure including the risks, benefits and alternatives for the proposed anesthesia with the patient or authorized representative who has indicated his/her understanding and acceptance.   Dental advisory given  Plan Discussed with: CRNA, Anesthesiologist and  Surgeon  Anesthesia Plan Comments:         Anesthesia Quick Evaluation

## 2017-01-10 NOTE — Anesthesia Postprocedure Evaluation (Signed)
Anesthesia Post Note  Patient: Jon Reid  Procedure(s) Performed: OPEN RIGHT HERNIA REPAIR INGUINAL (Right ) INSERTION OF MESH (Right )     Patient location during evaluation: PACU Anesthesia Type: General Level of consciousness: awake and alert and oriented Pain management: pain level controlled Vital Signs Assessment: post-procedure vital signs reviewed and stable Respiratory status: spontaneous breathing, nonlabored ventilation and respiratory function stable Cardiovascular status: blood pressure returned to baseline and stable Postop Assessment: no apparent nausea or vomiting Anesthetic complications: no    Last Vitals:  Vitals:   01/10/17 0841 01/10/17 1211  BP: (!) 166/70 (!) 120/59  Pulse: 62 (!) 59  Resp: 18 15  Temp: 36.8 C 36.5 C  SpO2: 100% 100%    Last Pain:  Vitals:   01/10/17 0841  TempSrc: Oral                 Brynja Marker A.

## 2017-01-10 NOTE — Discharge Instructions (Addendum)
POST OP INSTRUCTIONS  1. DIET: As tolerated. Follow a light bland diet the first 24 hours after arrival home, such as soup, liquids, crackers, etc.  Be sure to include lots of fluids daily.  Avoid fast food or heavy meals as your are more likely to get nauseated.  Eat a low fat the next few days after surgery.  2. Take your usually prescribed home medications unless otherwise directed.  3. PAIN CONTROL: a. Pain is best controlled by a usual combination of three different methods TOGETHER: i. Ice/Heat ii. Over the counter pain medication iii. Prescription pain medication b. Most patients will experience some swelling and bruising around the surgical site.  Ice packs or heating pads (30-60 minutes up to 6 times a day) will help. Some people prefer to use ice alone, heat alone, alternating between ice & heat.  Experiment to what works for you.  Swelling and bruising can take several weeks to resolve.   c. It is helpful to take an over-the-counter pain medication regularly for the first few weeks: i. Ibuprofen (Motrin/Advil) - 200mg  tabs - take 3 tabs (600mg ) every 6 hours as needed for pain ii. Acetaminophen (Tylenol) - you may take 650mg  every 6 hours as needed. You can take this with motrin as they act differently on the body. If you are taking a narcotic pain medication that has acetaminophen in it, do not take over the counter tylenol at the same time.  Iii. NOTE: You may take both of these medications together - most patients  find it most helpful when alternating between the two (i.e. Ibuprofen at 6am,  tylenol at 9am, ibuprofen at 12pm ...) d. A  prescription for pain medication should be given to you upon discharge.  Take your pain medication as prescribed if your pain is not adequatly controlled with the over-the-counter pain reliefs mentioned above.  4. Avoid getting constipated.  Between the surgery and the pain medications, it is common to experience some constipation.  Increasing fluid  intake and taking a fiber supplement (such as Metamucil, Citrucel, FiberCon, MiraLax, etc) 1-2 times a day regularly will usually help prevent this problem from occurring.  A mild laxative (prune juice, Milk of Magnesia, MiraLax, etc) should be taken according to package directions if there are no bowel movements after 48 hours.    5. Dressing: Your incision is covered in Dermabond which is like sterile superglue for the skin. This will come off on it's own in a couple weeks. It is waterproof and you may bathe normally starting the day after your surgery in a shower. Avoid baths/pools/lakes/oceans until your wounds have fully healed.  6. ACTIVITIES as tolerated:   a. Avoid heavy lifting (>10lbs or 1 gallon of milk) for the next 6 weeks. b. You may resume regular (light) daily activities beginning the next day--such as daily self-care, walking, climbing stairs--gradually increasing activities as tolerated.  If you can walk 30 minutes without difficulty, it is safe to try more intense activity such as jogging, treadmill, bicycling, low-impact aerobics.  c. DO NOT PUSH THROUGH PAIN.  Let pain be your guide: If it hurts to do something, don't do it. d. Dennis Bast may drive when you are no longer taking prescription pain medication, you can comfortably wear a seatbelt, and you can safely maneuver your car and apply brakes. e. Dennis Bast may have sexual intercourse when it is comfortable.   7. FOLLOW UP in our office a. Please call [facility] at [phone] to set up an appointment  to see your surgeon in the office for a follow-up appointment approximately 2 weeks after your surgery. b. Make sure that you call for this appointment the day you arrive home to insure a convenient appointment time.  9. If you have disability or family leave forms that need to be completed, you may have them completed by your primary care physician's office; for return to work instructions, please ask our office staff and they will be happy to  assist you in obtaining this documentation   When to call us [phone]: 1. Poor pain control 2. Reactions / problems with new medications (rash/itching, etc)  3. Fever over 101.5 F (38.5 C) 4. Inability to urinate 5. Nausea/vomiting 6. Worsening swelling or bruising 7. Continued bleeding from incision. 8. Increased pain, redness, or drainage from the incision  The clinic staff is available to answer your questions during regular business hours (8:30am-5pm).  Please dont hesitate to call and ask to speak to one of our nurses for clinical concerns.   A surgeon from [facility] is always on call at the hospitals   If you have a medical emergency, go to the nearest emergency room or call 911.  [facility], [place] [address], [place], [place], [place]  [address] MAIN: [phone] FAX: [phone] [email or web address]   General Anesthesia, Adult, Care After These instructions provide you with information about caring for yourself after your procedure. Your health care provider may also give you more specific instructions. Your treatment has been planned according to current medical practices, but problems sometimes occur. Call your health care provider if you have any problems or questions after your procedure. What can I expect after the procedure? After the procedure, it is common to have:  Vomiting.  A sore throat.  Mental slowness.  It is common to feel:  Nauseous.  Cold or shivery.  Sleepy.  Tired.  Sore or achy, even in parts of your body where you did not have surgery.  Follow these instructions at home: For at least 24 hours after the procedure:  Do not: ? Participate in activities where you could fall or become injured. ? Drive. ? Use heavy machinery. ? Drink alcohol. ? Take sleeping pills or medicines that cause drowsiness. ? Make important decisions or sign legal documents. ? Take care of children on  your own.  Rest. Eating and drinking  If you vomit, drink water, juice, or soup when you can drink without vomiting.  Drink enough fluid to keep your urine clear or pale yellow.  Make sure you have little or no nausea before eating solid foods.  Follow the diet recommended by your health care provider. General instructions  Have a responsible adult stay with you until you are awake and alert.  Return to your normal activities as told by your health care provider. Ask your health care provider what activities are safe for you.  Take over-the-counter and prescription medicines only as told by your health care provider.  If you smoke, do not smoke without supervision.  Keep all follow-up visits as told by your health care provider. This is important. Contact a health care provider if:  You continue to have nausea or vomiting at home, and medicines are not helpful.  You cannot drink fluids or start eating again.  You cannot urinate after 8-12 hours.  You develop a skin rash.  You have fever.  You have increasing redness at the site of your procedure. Get help right  away if:  You have difficulty breathing.  You have chest pain.  You have unexpected bleeding.  You feel that you are having a life-threatening or urgent problem. This information is not intended to replace advice given to you by your health care provider. Make sure you discuss any questions you have with your health care provider. Document Released: 06/11/2000 Document Revised: 08/08/2015 Document Reviewed: 02/17/2015 Elsevier Interactive Patient Education  [employer].

## 2017-01-10 NOTE — Op Note (Signed)
01/10/2017  12:02 PM  PATIENT:  Jon Reid  83 y.o. male  Patient Care Team: Chipper Herb, MD as PCP - General (Family Medicine) Festus Aloe, MD (Urology) Curt Bears, MD (Hematology and Oncology) Warden Fillers, MD as Consulting Physician (Ophthalmology) Juanita Craver, MD as Consulting Physician (Gastroenterology) Latanya Maudlin, MD as Consulting Physician (Orthopedic Surgery) Kathrynn Ducking, MD as Consulting Physician (Neurology)  PRE-OPERATIVE DIAGNOSIS:  Right inguinal hernia  POST-OPERATIVE DIAGNOSIS:  Right indirect inguinal hernia  PROCEDURE:  Open right inguinal hernia repair with mesh  SURGEON:  Sharon Mt. Hiyab Nhem, MD  ASSISTANT: None  ANESTHESIA:  General endotracheal  COUNTS:  Sponge, needle and instrument counts were reported correct x2 at the conclusion of the operation.  EBL: 10cc  DRAINS: None  SPECIMEN: None  FINDINGS: Right reducible indirect hernia repaired with Ultrapro mesh.  INDICATION: [name] is an 82yo male who presented to the office in the last month for a right inguinal hernia which he had had for some time without symptoms, however, approximately 47mo ago developed significant groin swelling and pain which ultimately subsided after 6-8hrs of lying down, likely an episode of incarceration.The anatomy and physiology of the inguinal canal, femoral canal and abdominal wall was described in detail using pictures/diagrams. The pathophysiology of inguinal and femoral hernias was also detailed. The procedure, material risks (including, but not limited to, pain, bleeding, infection, scarring, aspiration, need for blood transfusion, damage to surrounding structures, need for additional procedures, recurrence, damage to viscus/bladder/testicle, ischemic orchitis, mesh complications, heart attack, stroke, death), benefits and alternatives were explained. Additionally, we discussed the use of mesh in this procedure. The likelihood of  success in repairing the hernia and returning the patient to their previous functional status is good The patient's questions were answered to their satisfaction and they elected to proceed with surgery.  DESCRIPTION: The patient was identified in preop holding and taken to the OR where they were placed supine on the operating room table and SCDs were placed. General endotracheal anesthesia was induced without difficulty. The patient was then prepped and draped in the usual sterile fashion. A surgical timeout was performed indicating the correct patient, procedure, positioning and need for preoperative antibiotics.  The lower abdomen and groin was prepped with Chloraprep and draped in the standard fashion, and 0.25% Marcaine with epinephrine + Exparel in a 50:50 concentration was used to anesthetize the skin over the mid-portion of the inguinal canal. An oblique incision was made. Dissection was carried down through the subcutaneous tissue with cautery to the external oblique fascia.  We opened the external oblique fascia along the direction of its fibers to the external ring.  The spermatic cord was circumferentially dissected bluntly and retracted with a Penrose drain.  The ilioinguinal nerve was identified and divided as it would have been intimately incorporated into his mesh given the way it laid in the inguinal floor.  The floor of the inguinal canal was inspected. A spermatic cord lipoma was identified and excised. The hernia sac was dissected to the abdominal wall from the spermatic cord and palpated to ensure no contents were inside. After verifying, the sac was opened distally with Metzenbaum scissors. Under direct visualization, an 0 vicryl suture was used to perform a high ligation of the sac. A piece of ultrapro mesh was then opened and cut to fit his inguinal floor. Three 0-Ethibond sutures were then used to secure the mesh medially to the pubic tubercle, ensuring at least 2cm of overlap. The mesh  was then secured inferiorly to the shelving edge of the inguinal ligament, and superiorly to the conjoined tendon. The mesh was tucked underneath the external oblique fascia laterally.  The flap of the mesh was closed around the spermatic cord to recreate the internal inguinal ring. All of this was done with 0 Ethibond suture. The floor was irrigated with sterile saline and hemostasis verified. The external oblique fascia was reapproximated with 2-0 Vicryl.  3-0 Vicryl was used to close the Scarpa's fascia and 4-0 Monocryl was used to close the skin in subcuticular fashion.  Dermabond was applied to the skin.  The patient was then extubated and brought to the recovery room in stable condition.  All sponge, instrument, and needle counts were correct prior to closure and at the conclusion of the case.  DISPOSITION: PACU in satisfactory condition  Sharon Mt. Dema Severin, M.D. [facility]  Note: This dictation was prepared with Dragon/digital dictation along with [employer]. Any transcriptional errors that result from this process are unintentional.

## 2017-01-10 NOTE — Transfer of Care (Signed)
Immediate Anesthesia Transfer of Care Note  Patient: Jon Reid  Procedure(s) Performed: OPEN RIGHT HERNIA REPAIR INGUINAL (Right ) INSERTION OF MESH (Right )  Patient Location: PACU  Anesthesia Type:General  Level of Consciousness: sedated  Airway & Oxygen Therapy: Patient Spontanous Breathing and Patient connected to face mask oxygen  Post-op Assessment: Report given to RN and Post -op Vital signs reviewed and stable  Post vital signs: Reviewed and stable  Last Vitals:  Vitals:   01/10/17 0841  BP: (!) 166/70  Pulse: 62  Resp: 18  Temp: 36.8 C  SpO2: 100%    Last Pain:  Vitals:   01/10/17 0841  TempSrc: Oral      Patients Stated Pain Goal: 4 (42/39/53 2023)  Complications: No apparent anesthesia complications

## 2017-01-11 ENCOUNTER — Encounter (HOSPITAL_COMMUNITY): Payer: Self-pay | Admitting: Surgery

## 2017-01-17 ENCOUNTER — Other Ambulatory Visit: Payer: Self-pay | Admitting: Cardiology

## 2017-01-20 ENCOUNTER — Encounter (HOSPITAL_COMMUNITY): Payer: Self-pay

## 2017-01-20 DIAGNOSIS — I129 Hypertensive chronic kidney disease with stage 1 through stage 4 chronic kidney disease, or unspecified chronic kidney disease: Secondary | ICD-10-CM | POA: Diagnosis not present

## 2017-01-20 DIAGNOSIS — N182 Chronic kidney disease, stage 2 (mild): Secondary | ICD-10-CM | POA: Diagnosis not present

## 2017-01-20 DIAGNOSIS — I2510 Atherosclerotic heart disease of native coronary artery without angina pectoris: Secondary | ICD-10-CM | POA: Insufficient documentation

## 2017-01-20 DIAGNOSIS — K5900 Constipation, unspecified: Secondary | ICD-10-CM | POA: Insufficient documentation

## 2017-01-20 DIAGNOSIS — D539 Nutritional anemia, unspecified: Secondary | ICD-10-CM | POA: Diagnosis not present

## 2017-01-20 DIAGNOSIS — Z79899 Other long term (current) drug therapy: Secondary | ICD-10-CM | POA: Insufficient documentation

## 2017-01-20 DIAGNOSIS — Z7982 Long term (current) use of aspirin: Secondary | ICD-10-CM | POA: Insufficient documentation

## 2017-01-20 DIAGNOSIS — N289 Disorder of kidney and ureter, unspecified: Secondary | ICD-10-CM | POA: Diagnosis not present

## 2017-01-20 NOTE — ED Triage Notes (Signed)
Just had hernia surgery and now for last 3 days constipated and bowel has not moved and still passing gas.

## 2017-01-21 ENCOUNTER — Emergency Department (HOSPITAL_COMMUNITY)
Admission: EM | Admit: 2017-01-21 | Discharge: 2017-01-21 | Disposition: A | Discharge status: Home / Self Care | Payer: MEDICARE | Attending: Emergency Medicine | Admitting: Emergency Medicine

## 2017-01-21 DIAGNOSIS — D539 Nutritional anemia, unspecified: Secondary | ICD-10-CM

## 2017-01-21 DIAGNOSIS — K5900 Constipation, unspecified: Secondary | ICD-10-CM

## 2017-01-21 DIAGNOSIS — N289 Disorder of kidney and ureter, unspecified: Secondary | ICD-10-CM

## 2017-01-21 LAB — URINALYSIS, ROUTINE W REFLEX MICROSCOPIC
Bilirubin Urine: NEGATIVE
GLUCOSE, UA: NEGATIVE mg/dL
Hgb urine dipstick: NEGATIVE
KETONES UR: NEGATIVE mg/dL
LEUKOCYTES UA: NEGATIVE
Nitrite: NEGATIVE
PROTEIN: NEGATIVE mg/dL
Specific Gravity, Urine: 1.018 (ref 1.005–1.030)
pH: 5 (ref 5.0–8.0)

## 2017-01-21 LAB — CBC
HEMATOCRIT: 35.3 % — AB (ref 39.0–52.0)
Hemoglobin: 11.6 g/dL — ABNORMAL LOW (ref 13.0–17.0)
MCH: 33.5 pg (ref 26.0–34.0)
MCHC: 32.9 g/dL (ref 30.0–36.0)
MCV: 102 fL — AB (ref 78.0–100.0)
Platelets: 347 10*3/uL (ref 150–400)
RBC: 3.46 MIL/uL — ABNORMAL LOW (ref 4.22–5.81)
RDW: 13.3 % (ref 11.5–15.5)
WBC: 11.3 10*3/uL — AB (ref 4.0–10.5)

## 2017-01-21 LAB — COMPREHENSIVE METABOLIC PANEL
ALBUMIN: 4 g/dL (ref 3.5–5.0)
ALT: 20 U/L (ref 17–63)
AST: 24 U/L (ref 15–41)
Alkaline Phosphatase: 72 U/L (ref 38–126)
Anion gap: 8 (ref 5–15)
BUN: 19 mg/dL (ref 6–20)
CHLORIDE: 109 mmol/L (ref 101–111)
CO2: 24 mmol/L (ref 22–32)
Calcium: 9.2 mg/dL (ref 8.9–10.3)
Creatinine, Ser: 1.42 mg/dL — ABNORMAL HIGH (ref 0.61–1.24)
GFR calc non Af Amer: 43 mL/min — ABNORMAL LOW (ref >60)
GFR, EST AFRICAN AMERICAN: 50 mL/min — AB (ref >60)
GLUCOSE: 112 mg/dL — AB (ref 65–99)
POTASSIUM: 3.9 mmol/L (ref 3.5–5.1)
SODIUM: 141 mmol/L (ref 135–145)
Total Bilirubin: 0.8 mg/dL (ref 0.3–1.2)
Total Protein: 6.8 g/dL (ref 6.5–8.1)

## 2017-01-21 LAB — LIPASE, BLOOD: LIPASE: 31 U/L (ref 11–51)

## 2017-01-21 MED ORDER — MAGNESIUM CITRATE PO SOLN
1.0000 | Freq: Once | ORAL | Status: AC
  Administered 2017-01-21: 1 via ORAL
  Filled 2017-01-21: qty 296

## 2017-01-21 MED ORDER — PEG 3350-KCL-NABCB-NACL-NASULF 236 G PO SOLR: 4.0000 L | Freq: Once | ORAL | 0 refills | Status: AC

## 2017-01-21 NOTE — Discharge Instructions (Signed)
If the magnesium citrate does not work in the next 5-6 hours, then fill the prescription for GoLytely and follow the instructions for that. Return to the emergency department if you start having abdominal pain, or if you start running a fever.

## 2017-01-21 NOTE — ED Provider Notes (Signed)
[facility] DEPT Provider Note   CSN: [id number] Arrival date & time: 01/20/17  1912     History   Chief Complaint Chief Complaint  Patient presents with  . Constipation    HPI Jon Reid is a 83 y.o. male.  The history is provided by the patient.  He has a history of carcinoma the prostate, stage II chronic kidney disease, coronary artery disease, hypertension, GERD.  10 days ago, he had right inguinal herniorrhaphy.  He had been doing well until 3 days ago when he started to have constipation.  He relates that he has not taken any narcotic analgesics.  He has been able to pass flatus, but has been unable to pass any stool.  He has taken MiraLAX and used a suppository without any benefit.  He is complaining of pain in the anal area when he sits on something hard, but he denies any abdominal pain, nausea, vomiting, fever, chills.  He is not having any problems with his surgical incision.  Past Medical History:  Diagnosis Date  . Carcinoma of prostate [facility])    prostate  . CKD (chronic kidney disease) stage 2, GFR 60-89 ml/min   . Coronary artery disease    a. LHC (8/14):  3v CAD => s/p CABG (LIMA-LAD, SVG-OM1, SVG-PDA)  . GERD (gastroesophageal reflux disease)   . Gout   . HTN (hypertension)   . Hx of echocardiogram    Echocardiogram 11/13/12: EF 55-60%, normal wall motion, biatrial enlargement, mild AI, mild TR, no pericardial effusion  . Sleep apnea    Questionable    Patient Active Problem List   Diagnosis Date Noted  . Fatigue 11/14/2016  . Abnormality of gait 12/06/2015  . Abdominal aortic atherosclerosis ([place]) 12/01/2015  . Lumbar degenerative disc disease 12/01/2015  . Exertional angina ([place]) 07/20/2015  . Hernia of abdominal wall 10/12/2014  . Gynecomastia 04/09/2013  . Hx of CABG 02/02/2013  . 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 Myelodysplastic syndrome 11/06/2012  . Accelerating angina ([place]) 11/06/2012  . Vitamin B 12 deficiency  07/07/2012  . Hyperlipemia 07/07/2012  . Macrocytosis without anemia 01/08/2012  . PVD 04/14/2009  . CARCINOMA, PROSTATE 04/13/2009  . VASCULAR PURPURA 04/13/2009  . Benign essential HTN 04/13/2009  . Chronic renal insufficiency, stage III (moderate) ([place]) 04/13/2009  . SLEEP APNEA 04/13/2009  . ELECTROCARDIOGRAM, ABNORMAL 04/13/2009    Past Surgical History:  Procedure Laterality Date  . CARDIAC CATHETERIZATION  11/07/2012   Dr Acie Fredrickson  . LYMPH NODE DISSECTION     Bilateral pelvic  . RETROPUBIC PROSTATECTOMY     Radical       Home Medications    Prior to Admission medications   Medication Sig Start Date End Date Taking? Authorizing Provider  aspirin EC 81 MG tablet Take 81 mg by mouth daily.    [provider]  Cholecalciferol (VITAMIN D) 2000 units CAPS Take 2,000 Units by mouth daily.    [provider]  Cyanocobalamin (B-12 COMPLIANCE INJECTION) 1000 MCG/ML KIT Inject as directed every 30 (thirty) days. Gets at Dr office    [provider]  fluticasone (FLONASE) 50 MCG/ACT nasal spray One to 2 sprays each nostril at bedtime Patient taking differently: Place 1-2 sprays into both nostrils daily as needed for allergies or rhinitis (depends on congestion if does 1-2 sprays).  10/12/14   Chipper Herb, MD  furosemide (LASIX) 40 MG tablet Take 40 mg by mouth 3 (three) times a week.  [provider]  metoprolol succinate (TOPROL-XL) 25 MG 24 hr tablet TAKE 1 TABLET DAILY 01/17/17   Kilroy, Doreene Burke, PA-C  nitroGLYCERIN (NITROSTAT) 0.4 MG SL tablet Place 1 tablet (0.4 mg total) under the tongue every 5 (five) minutes as needed for chest pain. 07/20/15   Erlene Quan, PA-C  triamcinolone cream (KENALOG) 0.1 % Apply 1 application topically 2 (two) times daily. Patient taking differently: Apply 1 application topically 2 (two) times daily as needed (itching).  05/06/13   Chipper Herb, MD    Family History Family History  Problem Relation Age of  Onset  . Aneurysm Father        Cerebral  . Stroke Father   . Hypertension Father   . Cancer Mother        colon  . Parkinson's disease Son     Social History Social History   Tobacco Use  . Smoking status: Never Smoker  . Smokeless tobacco: Never Used  Substance Use Topics  . Alcohol use: Yes    Comment: Rare  . Drug use: No     Allergies   Crestor [rosuvastatin]   Review of Systems Review of Systems  All other systems reviewed and are negative.    Physical Exam Updated Vital Signs BP (!) 163/82 (BP Location: Left Arm)   Pulse 83   Temp 98.9 F (37.2 C) (Oral)   Resp 18   Ht 6' (1.829 m)   Wt 79.4 kg (175 lb)   SpO2 99%   BMI 23.73 kg/m   Physical Exam  Nursing note and vitals reviewed.  83 year old male, resting comfortably and in no acute distress. Vital signs are significant for hypertension. Oxygen saturation is 99%, which is normal. Head is normocephalic and atraumatic. PERRLA, EOMI. Oropharynx is clear. Neck is nontender and supple without adenopathy or JVD. Back is nontender and there is no CVA tenderness. Lungs are clear without rales, wheezes, or rhonchi. Chest is nontender. Heart has regular rate and rhythm without murmur. Abdomen is soft, flat, nontender without masses or hepatosplenomegaly and peristalsis is normoactive.  Surgical incision for right inguinal herniorrhaphy is healing well without signs of infection. Rectal: Normal sphincter tone, no masses.  No impaction.  Small amount of light yellow stool present. Extremities have no cyanosis or edema, full range of motion is present. Skin is warm and dry without rash. Neurologic: Mental status is normal, cranial nerves are intact, there are no motor or sensory deficits.  ED Treatments / Results  Labs (all labs ordered are listed, but only abnormal results are displayed) Labs Reviewed  COMPREHENSIVE METABOLIC PANEL - Abnormal; Notable for the following components:      Result Value    Glucose, Bld 112 (*)    Creatinine, Ser 1.42 (*)    GFR calc non Af Amer 43 (*)    GFR calc Af Amer 50 (*)    All other components within normal limits  CBC - Abnormal; Notable for the following components:   WBC 11.3 (*)    RBC 3.46 (*)    Hemoglobin 11.6 (*)    HCT 35.3 (*)    MCV 102.0 (*)    All other components within normal limits  LIPASE, BLOOD  URINALYSIS, ROUTINE W REFLEX MICROSCOPIC   Procedures Procedures (including critical care time)  Medications Ordered in ED Medications  magnesium citrate solution 1 Bottle (not administered)     Initial Impression / Assessment and Plan / ED Course  I have  reviewed the triage vital signs and the nursing notes.  Pertinent labs results that were available during my care of the patient were reviewed by me and considered in my medical decision making (see chart for details).  Constipation.  Benign abdominal exam and reassuring labs-no indication for abdominal imaging today.  He is given a dose of magnesium citrate and discharged.  Given a prescription for GoLYTELY to use if magnesium citrate does not give him adequate relief.  Return precautions discussed.  Final Clinical Impressions(s) / ED Diagnoses   Final diagnoses:  Constipation, unspecified constipation type    ED Discharge Orders        Ordered    polyethylene glycol (GOLYTELY) 236 g solution   Once     65/16/86 1042       Delora Fuel, MD 47/31/92 [phone]

## 2017-01-22 ENCOUNTER — Encounter: Payer: Self-pay | Admitting: *Deleted

## 2017-01-31 ENCOUNTER — Ambulatory Visit (INDEPENDENT_AMBULATORY_CARE_PROVIDER_SITE_OTHER): Payer: MEDICARE | Admitting: *Deleted

## 2017-01-31 DIAGNOSIS — E538 Deficiency of other specified B group vitamins: Secondary | ICD-10-CM | POA: Diagnosis not present

## 2017-01-31 MED ORDER — CYANOCOBALAMIN 1000 MCG/ML IJ SOLN
1000.0000 ug | INTRAMUSCULAR | Status: AC
  Administered 2017-01-31 – 2018-01-01 (×10): 1000 ug via INTRAMUSCULAR

## 2017-01-31 NOTE — Progress Notes (Signed)
Pt given Cyanocobalamin inj Tolerated well 

## 2017-03-14 ENCOUNTER — Ambulatory Visit (INDEPENDENT_AMBULATORY_CARE_PROVIDER_SITE_OTHER): Payer: MEDICARE

## 2017-03-14 DIAGNOSIS — E538 Deficiency of other specified B group vitamins: Secondary | ICD-10-CM | POA: Diagnosis not present

## 2017-03-14 NOTE — Progress Notes (Signed)
B-12 injection given IM to right deltoid.  Patient tolerated well.

## 2017-04-25 ENCOUNTER — Ambulatory Visit (INDEPENDENT_AMBULATORY_CARE_PROVIDER_SITE_OTHER): Payer: MEDICARE | Admitting: *Deleted

## 2017-04-25 DIAGNOSIS — E538 Deficiency of other specified B group vitamins: Secondary | ICD-10-CM | POA: Diagnosis not present

## 2017-04-25 NOTE — Progress Notes (Signed)
Pt given Cyanocobalamin inj Tolerated well 

## 2017-04-30 ENCOUNTER — Ambulatory Visit (INDEPENDENT_AMBULATORY_CARE_PROVIDER_SITE_OTHER): Payer: MEDICARE | Admitting: Family Medicine

## 2017-04-30 ENCOUNTER — Encounter: Payer: Self-pay | Admitting: Family Medicine

## 2017-04-30 VITALS — BP 150/64 | HR 60 | Temp 96.6°F | Ht 72.0 in | Wt 177.0 lb

## 2017-04-30 DIAGNOSIS — E538 Deficiency of other specified B group vitamins: Secondary | ICD-10-CM | POA: Diagnosis not present

## 2017-04-30 DIAGNOSIS — E7800 Pure hypercholesterolemia, unspecified: Secondary | ICD-10-CM

## 2017-04-30 DIAGNOSIS — I10 Essential (primary) hypertension: Secondary | ICD-10-CM

## 2017-04-30 DIAGNOSIS — D469 Myelodysplastic syndrome, unspecified: Secondary | ICD-10-CM | POA: Diagnosis not present

## 2017-04-30 DIAGNOSIS — S61219A Laceration without foreign body of unspecified finger without damage to nail, initial encounter: Secondary | ICD-10-CM

## 2017-04-30 DIAGNOSIS — E559 Vitamin D deficiency, unspecified: Secondary | ICD-10-CM | POA: Diagnosis not present

## 2017-04-30 DIAGNOSIS — N183 Chronic kidney disease, stage 3 unspecified: Secondary | ICD-10-CM

## 2017-04-30 DIAGNOSIS — I700 Atherosclerosis of aorta: Secondary | ICD-10-CM

## 2017-04-30 DIAGNOSIS — Z1283 Encounter for screening for malignant neoplasm of skin: Secondary | ICD-10-CM

## 2017-04-30 DIAGNOSIS — C61 Malignant neoplasm of prostate: Secondary | ICD-10-CM

## 2017-04-30 DIAGNOSIS — R29898 Other symptoms and signs involving the musculoskeletal system: Secondary | ICD-10-CM

## 2017-04-30 DIAGNOSIS — S61217A Laceration without foreign body of left little finger without damage to nail, initial encounter: Secondary | ICD-10-CM | POA: Diagnosis not present

## 2017-04-30 MED ORDER — TRIAMCINOLONE ACETONIDE 0.1 % EX CREA: 1.0000 "application " | TOPICAL_CREAM | Freq: Two times a day (BID) | CUTANEOUS | 3 refills | Status: DC

## 2017-04-30 NOTE — Patient Instructions (Addendum)
Medicare Annual Wellness Visit  [facility] and the medical providers at [facility] strive to bring you the best medical care.  In doing so we not only want to address your current medical conditions and concerns but also to detect new conditions early and prevent illness, disease and health-related problems.    Medicare offers a yearly Wellness Visit which allows our clinical staff to assess your need for preventative services including immunizations, lifestyle education, counseling to decrease risk of preventable diseases and screening for fall risk and other medical concerns.    This visit is provided free of charge (no copay) for all Medicare recipients. The clinical pharmacists at [facility] have begun to conduct these Wellness Visits which will also include a thorough review of all your medications.    As you primary medical provider recommend that you make an appointment for your Annual Wellness Visit if you have not done so already this year.  You may set up this appointment before you leave today or you may call back ([phone]) and schedule an appointment.  Please make sure when you call that you mention that you are scheduling your Annual Wellness Visit with the clinical pharmacist so that the appointment may be made for the proper length of time.     Continue current medications. Continue good therapeutic lifestyle changes which include good diet and exercise. Fall precautions discussed with patient. If an FOBT was given today- please return it to our front desk. If you are over 80 years old - you may need Prevnar 60 or the adult Pneumonia vaccine.  **Flu shots are available--- please call and schedule a FLU-CLINIC appointment**  After your visit with [name] today you will receive a survey in the mail or online from [employer] regarding your care with [name]. Please take a moment to fill this out. Your feedback is very  important to [name] as you can help [name] better understand your patient needs as well as improve your experience and satisfaction. WE CARE ABOUT YOU!!!   Continue to follow-up with cardiology and neurology as needed.. Continue to follow-up with ophthalmology. Dress the finger a couple of times a day with Betadine wet to dry and try to keep it as dry as possible Return to the office in 10 days or so for [name] to recheck the finger. We will arrange for you to have an appointment with the dermatologist because of all the seborrheic keratoses on her scalp and behind the left ear. Drink more water, use MiraLAX once daily or every other day to help keep bowels moving more regularly  Take Nexium on a regular basis 20 mg once daily before breakfast.  At least do this for 1 month so that we can get rid of all of the heartburn you have been having and then take it as needed after that. Continue to monitor blood pressures at home and watch sodium intake

## 2017-04-30 NOTE — Progress Notes (Signed)
Subjective:    Patient ID: Jon Reid, male    DOB: 08-27-1928, 84 y.o.   MRN: [id number]  HPI Pt here for follow up and management of chronic medical problems which includes hypertension and hyperlipidemia. He is taking medication regularly.  The patient comes in for his regular 49-monthcheckup today.  He is complaining with some constipation.  He also is complaining with a wound on his left fifth finger secondary to a fall on Saturday.  He is requesting refills on his medications.  He has had lab work done today and we will call him with those results once they become available.  He continues to be followed regularly by the cardiologist because of bypass surgery in the past.  He has some gait instability and has seen the neurologist on multiple occasions.  He has had a CABG and prostate cancer.  The patient denies any chest pain or any increased shortness of breath other than these episodes of fatigue that just hit him at times unrelated to activity.  He just says periodically has episodes of decreased energy with no chest pain.  Is like a worn out feeling when he has this.  It does not necessarily happen when he is walking but it could happen when he is walking.  He also describes some increased heartburn and indigestion and constipation.  He has been taking Nexium but only if needed.  I have encouraged him to take this more regularly.  Also encouraged him to drink more water and stay more hydrated and to take MiraLAX more regularly.  He fell 3 days ago and received a laceration from stretching his finger at the crease between the DIP joint of the fifth finger.  It is too late to suture this.  The patient also indicates that he is due to get an eye exam and some discussion about the redundant skin above his eyes affecting his vision and he is going to look into that further when he sees the ophthalmologist. The patient says that his home blood pressures have been running in the 118/60  range.     Patient Active Problem List   Diagnosis Date Noted  . Fatigue 11/14/2016  . Abnormality of gait 12/06/2015  . Abdominal aortic atherosclerosis ([facility] 12/01/2015  . Lumbar degenerative disc disease 12/01/2015  . Exertional angina ([facility] 07/20/2015  . Hernia of abdominal wall 10/12/2014  . Gynecomastia 04/09/2013  . Hx of CABG 02/02/2013  . 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 Myelodysplastic syndrome 11/06/2012  . Accelerating angina ([facility] 11/06/2012  . Vitamin B 12 deficiency 07/07/2012  . Hyperlipemia 07/07/2012  . Macrocytosis without anemia 01/08/2012  . PVD 04/14/2009  . CARCINOMA, PROSTATE 04/13/2009  . VASCULAR PURPURA 04/13/2009  . Benign essential HTN 04/13/2009  . Chronic renal insufficiency, stage III (moderate) ([facility] 04/13/2009  . SLEEP APNEA 04/13/2009  . ELECTROCARDIOGRAM, ABNORMAL 04/13/2009   Outpatient Encounter Medications as of 04/30/2017  Medication Sig  . aspirin EC 81 MG tablet Take 81 mg by mouth daily.  . Cholecalciferol (VITAMIN D) 2000 units CAPS Take 2,000 Units by mouth daily.  . Cyanocobalamin (B-12 COMPLIANCE INJECTION) 1000 MCG/ML KIT Inject as directed every 30 (thirty) days. Gets at Dr office  . fluticasone (FLONASE) 50 MCG/ACT nasal spray One to 2 sprays each nostril at bedtime (Patient taking differently: Place 1-2 sprays into both nostrils daily as needed for allergies or rhinitis (depends on congestion if does 1-2 sprays). )  . furosemide (LASIX) 40 MG tablet  Take 40 mg by mouth 3 (three) times a week.  . metoprolol succinate (TOPROL-XL) 25 MG 24 hr tablet TAKE 1 TABLET DAILY  . nitroGLYCERIN (NITROSTAT) 0.4 MG SL tablet Place 1 tablet (0.4 mg total) under the tongue every 5 (five) minutes as needed for chest pain.  Marland Kitchen triamcinolone cream (KENALOG) 0.1 % Apply 1 application topically 2 (two) times daily. (Patient taking differently: Apply 1 application topically 2 (two) times daily as needed (itching). )   Facility-Administered Encounter  Medications as of 04/30/2017  Medication  . cyanocobalamin ((VITAMIN B-12)) injection 1,000 mcg     Review of Systems  Constitutional: Negative.   HENT: Negative.   Eyes: Negative.   Respiratory: Negative.   Cardiovascular: Negative.   Gastrointestinal: Positive for constipation.  Endocrine: Negative.   Genitourinary: Negative.   Musculoskeletal: Negative.   Skin: Positive for wound (left 5th finger - fall saturday ).  Allergic/Immunologic: Negative.   Neurological: Negative.   Hematological: Negative.   Psychiatric/Behavioral: Negative.        Objective:   Physical Exam  Constitutional: He is oriented to person, place, and time. He appears well-developed and well-nourished. No distress.  The patient is pleasant calm and relaxed.  He has severe kyphosis.  HENT:  Head: Normocephalic and atraumatic.  Right Ear: External ear normal.  Left Ear: External ear normal.  Nose: Nose normal.  Mouth/Throat: Oropharynx is clear and moist. No oropharyngeal exudate.  The patient has redundant upper eyelid skin on both eyes right being worse than the left.  He has several actinic and seborrheic keratoses on the scalp and ear.  We will arrange for him to see the dermatologist about removal of these.  Eyes: Conjunctivae and EOM are normal. Pupils are equal, round, and reactive to light. Right eye exhibits no discharge. Left eye exhibits no discharge. No scleral icterus.  Redundant eyelid skin right greater than left patient plans to follow-up with his ophthalmologist soon.  Neck: Normal range of motion. Neck supple. No thyromegaly present.  No bruits thyromegaly or anterior cervical adenopathy  Cardiovascular: Normal rate, regular rhythm, normal heart sounds and intact distal pulses.  No murmur heard. Heart is regular at 60/min  Pulmonary/Chest: Effort normal and breath sounds normal. No respiratory distress. He has no wheezes. He has no rales. He exhibits no tenderness.  Clear anteriorly and  posteriorly and no chest wall masses or axillary adenopathy  Abdominal: Soft. Bowel sounds are normal. He exhibits no mass. There is no tenderness. There is no rebound and no guarding.  The abdomen is soft without masses tenderness or organ enlargement or bruits  Musculoskeletal: He exhibits deformity. He exhibits no edema.  Fairly severe kyphosis for this patient.  Lymphadenopathy:    He has no cervical adenopathy.  Neurological: He is alert and oriented to person, place, and time. He has normal reflexes. No cranial nerve deficit.  Skin: Skin is warm and dry. No rash noted. No erythema.  Laceration of the DIP flexor scan of the fifth finger of the left hand.  The wound will be cleansed and a wet-to-dry Betadine dressing will be applied and the patient will be instructed to do this and come back and let us recheck this in a couple weeks.  Or sooner if it gets infected.  Psychiatric: He has a normal mood and affect. His behavior is normal. Judgment and thought content normal.  Nursing note and vitals reviewed.  BP (!) 150/64 (BP Location: Left Arm)   Pulse 60   Temp Marland Kitchen)  96.6 F (35.9 C) (Oral)   Ht 6' (1.829 m)   Wt 177 lb (80.3 kg)   BMI 24.01 kg/m         Assessment & Plan:  1. Vitamin D deficiency -Continue with vitamin D replacement pending results of lab work - CBC with Differential/Platelet - VITAMIN D 25 Hydroxy (Vit-D Deficiency, Fractures)  2. Benign essential HTN -The patient's home blood pressure readings have been good in the office reading has an elevated systolic number.  We will not make any changes in his treatment as he will continue to check readings at home. - BMP8+EGFR - CBC with Differential/Platelet - Hepatic function panel  3. B12 deficiency -Continue B12 replacement - CBC with Differential/Platelet  4. Pure hypercholesterolemia -Continue aggressive therapeutic lifestyle changes.  Patient had reaction to statin drugs in the past. - CBC with  Differential/Platelet - Lipid panel  5. Prostate cancer [facility]) -Follow-up with urology as needed - CBC with Differential/Platelet  6. Myelodysplastic syndrome -Follow-up with hematology as planned  7. Weakness of both lower extremities -Follow-up with neurology if needed  8. Abdominal aortic atherosclerosis ([place]) -Continue with as aggressive therapeutic lifestyle changes as possible  9. Chronic renal insufficiency, stage III (moderate) (HCC) -Keep blood pressure under as good of control as possible and continue to monitor at home and watch sodium intake  10. Finger laceration, initial encounter -Betadine wet to dry dressing and patient will change the dressing at least once daily and will return to the office in 10 days or so for recheck of this.  Meds ordered this encounter  Medications  . triamcinolone cream (KENALOG) 0.1 %    Sig: Apply 1 application topically 2 (two) times daily.    Dispense:  30 g    Refill:  3   Patient Instructions                       Medicare Annual Wellness Visit  [facility] [place] and the medical providers at [facility] strive to bring you the best medical care.  In doing so we not only want to address your current medical conditions and concerns but also to detect new conditions early and prevent illness, disease and health-related problems.    Medicare offers a yearly Wellness Visit which allows our clinical staff to assess your need for preventative services including immunizations, lifestyle education, counseling to decrease risk of preventable diseases and screening for fall risk and other medical concerns.    This visit is provided free of charge (no copay) for all Medicare recipients. The clinical pharmacists at [facility] have begun to conduct these Wellness Visits which will also include a thorough review of all your medications.    As you primary medical provider recommend that you make an  appointment for your Annual Wellness Visit if you have not done so already this year.  You may set up this appointment before you leave today or you may call back ([phone]) and schedule an appointment.  Please make sure when you call that you mention that you are scheduling your Annual Wellness Visit with the clinical pharmacist so that the appointment may be made for the proper length of time.     Continue current medications. Continue good therapeutic lifestyle changes which include good diet and exercise. Fall precautions discussed with patient. If an FOBT was given today- please return it to our front desk. If you are over 74 years old - you may need  Prevnar 13 or the adult Pneumonia vaccine.  **Flu shots are available--- please call and schedule a FLU-CLINIC appointment**  After your visit with [name] today you will receive a survey in the mail or online from [employer] regarding your care with [name]. Please take a moment to fill this out. Your feedback is very important to [name] as you can help [name] better understand your patient needs as well as improve your experience and satisfaction. WE CARE ABOUT YOU!!!   Continue to follow-up with cardiology and neurology as needed.. Continue to follow-up with ophthalmology. Dress the finger a couple of times a day with Betadine wet to dry and try to keep it as dry as possible Return to the office in 10 days or so for [name] to recheck the finger. We will arrange for you to have an appointment with the dermatologist because of all the seborrheic keratoses on her scalp and behind the left ear. Drink more water, use MiraLAX once daily or every other day to help keep bowels moving more regularly  Take Nexium on a regular basis 20 mg once daily before breakfast.  At least do this for 1 month so that we can get rid of all of the heartburn you have been having and then take it as needed after that. Continue to monitor blood pressures at home and watch sodium  intake  Arrie Senate MD

## 2017-05-01 LAB — CBC WITH DIFFERENTIAL/PLATELET
BASOS ABS: 0 10*3/uL (ref 0.0–0.2)
Basos: 1 %
EOS (ABSOLUTE): 0.1 10*3/uL (ref 0.0–0.4)
EOS: 1 %
HEMATOCRIT: 35.6 % — AB (ref 37.5–51.0)
HEMOGLOBIN: 12.1 g/dL — AB (ref 13.0–17.7)
IMMATURE GRANULOCYTES: 0 %
Immature Grans (Abs): 0 10*3/uL (ref 0.0–0.1)
Lymphocytes Absolute: 2.2 10*3/uL (ref 0.7–3.1)
Lymphs: 35 %
MCH: 33 pg (ref 26.6–33.0)
MCHC: 34 g/dL (ref 31.5–35.7)
MCV: 97 fL (ref 79–97)
MONOCYTES: 10 %
MONOS ABS: 0.7 10*3/uL (ref 0.1–0.9)
NEUTROS PCT: 53 %
Neutrophils Absolute: 3.3 10*3/uL (ref 1.4–7.0)
Platelets: 299 10*3/uL (ref 150–379)
RBC: 3.67 x10E6/uL — AB (ref 4.14–5.80)
RDW: 14.3 % (ref 12.3–15.4)
WBC: 6.3 10*3/uL (ref 3.4–10.8)

## 2017-05-01 LAB — HEPATIC FUNCTION PANEL
ALBUMIN: 4.3 g/dL (ref 3.5–4.7)
ALT: 11 IU/L (ref 0–44)
AST: 23 IU/L (ref 0–40)
Alkaline Phosphatase: 72 IU/L (ref 39–117)
Bilirubin Total: 0.5 mg/dL (ref 0.0–1.2)
Bilirubin, Direct: 0.16 mg/dL (ref 0.00–0.40)
TOTAL PROTEIN: 6.8 g/dL (ref 6.0–8.5)

## 2017-05-01 LAB — BMP8+EGFR
BUN/Creatinine Ratio: 12 (ref 10–24)
BUN: 18 mg/dL (ref 8–27)
CHLORIDE: 105 mmol/L (ref 96–106)
CO2: 22 mmol/L (ref 20–29)
CREATININE: 1.47 mg/dL — AB (ref 0.76–1.27)
Calcium: 9.3 mg/dL (ref 8.6–10.2)
GFR calc Af Amer: 49 mL/min/{1.73_m2} — ABNORMAL LOW (ref >59)
GFR calc non Af Amer: 43 mL/min/{1.73_m2} — ABNORMAL LOW (ref >59)
GLUCOSE: 103 mg/dL — AB (ref 65–99)
Potassium: 4.7 mmol/L (ref 3.5–5.2)
Sodium: 142 mmol/L (ref 134–144)

## 2017-05-01 LAB — LIPID PANEL
CHOL/HDL RATIO: 2.6 ratio (ref 0.0–5.0)
Cholesterol, Total: 160 mg/dL (ref 100–199)
HDL: 62 mg/dL (ref >39)
LDL Calculated: 85 mg/dL (ref 0–99)
TRIGLYCERIDES: 63 mg/dL (ref 0–149)
VLDL Cholesterol Cal: 13 mg/dL (ref 5–40)

## 2017-05-01 LAB — VITAMIN D 25 HYDROXY (VIT D DEFICIENCY, FRACTURES): Vit D, 25-Hydroxy: 44.4 ng/mL (ref 30.0–100.0)

## 2017-05-20 DIAGNOSIS — D225 Melanocytic nevi of trunk: Secondary | ICD-10-CM | POA: Diagnosis not present

## 2017-05-20 DIAGNOSIS — D0422 Carcinoma in situ of skin of left ear and external auricular canal: Secondary | ICD-10-CM | POA: Diagnosis not present

## 2017-05-20 DIAGNOSIS — D044 Carcinoma in situ of skin of scalp and neck: Secondary | ICD-10-CM | POA: Diagnosis not present

## 2017-06-04 ENCOUNTER — Ambulatory Visit (INDEPENDENT_AMBULATORY_CARE_PROVIDER_SITE_OTHER): Payer: MEDICARE | Admitting: *Deleted

## 2017-06-04 DIAGNOSIS — E538 Deficiency of other specified B group vitamins: Secondary | ICD-10-CM

## 2017-06-04 NOTE — Progress Notes (Signed)
Pt given Cyanocobalamin inj Tolerated well 

## 2017-07-02 DIAGNOSIS — H353131 Nonexudative age-related macular degeneration, bilateral, early dry stage: Secondary | ICD-10-CM | POA: Diagnosis not present

## 2017-07-02 DIAGNOSIS — H02105 Unspecified ectropion of left lower eyelid: Secondary | ICD-10-CM | POA: Diagnosis not present

## 2017-07-02 DIAGNOSIS — Z961 Presence of intraocular lens: Secondary | ICD-10-CM | POA: Diagnosis not present

## 2017-07-02 DIAGNOSIS — H02831 Dermatochalasis of right upper eyelid: Secondary | ICD-10-CM | POA: Diagnosis not present

## 2017-07-02 DIAGNOSIS — H04123 Dry eye syndrome of bilateral lacrimal glands: Secondary | ICD-10-CM | POA: Diagnosis not present

## 2017-07-02 DIAGNOSIS — H02423 Myogenic ptosis of bilateral eyelids: Secondary | ICD-10-CM | POA: Diagnosis not present

## 2017-07-02 DIAGNOSIS — H02834 Dermatochalasis of left upper eyelid: Secondary | ICD-10-CM | POA: Diagnosis not present

## 2017-07-02 DIAGNOSIS — H02403 Unspecified ptosis of bilateral eyelids: Secondary | ICD-10-CM | POA: Diagnosis not present

## 2017-07-02 DIAGNOSIS — H43812 Vitreous degeneration, left eye: Secondary | ICD-10-CM | POA: Diagnosis not present

## 2017-07-11 ENCOUNTER — Ambulatory Visit (INDEPENDENT_AMBULATORY_CARE_PROVIDER_SITE_OTHER): Payer: MEDICARE | Admitting: *Deleted

## 2017-07-11 DIAGNOSIS — E538 Deficiency of other specified B group vitamins: Secondary | ICD-10-CM | POA: Diagnosis not present

## 2017-07-11 NOTE — Progress Notes (Signed)
Pt given Cyanocobalamin inj Tolerated well 

## 2017-07-29 ENCOUNTER — Other Ambulatory Visit: Payer: Self-pay | Admitting: Family Medicine

## 2017-08-21 DIAGNOSIS — H02423 Myogenic ptosis of bilateral eyelids: Secondary | ICD-10-CM | POA: Diagnosis not present

## 2017-08-21 DIAGNOSIS — H53483 Generalized contraction of visual field, bilateral: Secondary | ICD-10-CM | POA: Diagnosis not present

## 2017-08-21 DIAGNOSIS — H57813 Brow ptosis, bilateral: Secondary | ICD-10-CM | POA: Diagnosis not present

## 2017-08-21 DIAGNOSIS — H02831 Dermatochalasis of right upper eyelid: Secondary | ICD-10-CM | POA: Diagnosis not present

## 2017-08-21 DIAGNOSIS — H02413 Mechanical ptosis of bilateral eyelids: Secondary | ICD-10-CM | POA: Diagnosis not present

## 2017-08-21 DIAGNOSIS — H02834 Dermatochalasis of left upper eyelid: Secondary | ICD-10-CM | POA: Diagnosis not present

## 2017-08-21 DIAGNOSIS — H0279 Other degenerative disorders of eyelid and periocular area: Secondary | ICD-10-CM | POA: Diagnosis not present

## 2017-08-21 DIAGNOSIS — I519 Heart disease, unspecified: Secondary | ICD-10-CM | POA: Insufficient documentation

## 2017-08-29 ENCOUNTER — Ambulatory Visit (INDEPENDENT_AMBULATORY_CARE_PROVIDER_SITE_OTHER): Payer: MEDICARE | Admitting: Family Medicine

## 2017-08-29 ENCOUNTER — Encounter: Payer: Self-pay | Admitting: Family Medicine

## 2017-08-29 ENCOUNTER — Ambulatory Visit (INDEPENDENT_AMBULATORY_CARE_PROVIDER_SITE_OTHER): Payer: MEDICARE

## 2017-08-29 VITALS — BP 149/64 | HR 57 | Temp 97.3°F | Ht 72.0 in | Wt 170.0 lb

## 2017-08-29 DIAGNOSIS — E538 Deficiency of other specified B group vitamins: Secondary | ICD-10-CM | POA: Diagnosis not present

## 2017-08-29 DIAGNOSIS — E7800 Pure hypercholesterolemia, unspecified: Secondary | ICD-10-CM

## 2017-08-29 DIAGNOSIS — R5383 Other fatigue: Secondary | ICD-10-CM

## 2017-08-29 DIAGNOSIS — E559 Vitamin D deficiency, unspecified: Secondary | ICD-10-CM

## 2017-08-29 DIAGNOSIS — C61 Malignant neoplasm of prostate: Secondary | ICD-10-CM

## 2017-08-29 DIAGNOSIS — D469 Myelodysplastic syndrome, unspecified: Secondary | ICD-10-CM

## 2017-08-29 DIAGNOSIS — N183 Chronic kidney disease, stage 3 unspecified: Secondary | ICD-10-CM

## 2017-08-29 DIAGNOSIS — Z951 Presence of aortocoronary bypass graft: Secondary | ICD-10-CM

## 2017-08-29 DIAGNOSIS — I10 Essential (primary) hypertension: Secondary | ICD-10-CM | POA: Diagnosis not present

## 2017-08-29 DIAGNOSIS — R29898 Other symptoms and signs involving the musculoskeletal system: Secondary | ICD-10-CM | POA: Diagnosis not present

## 2017-08-29 DIAGNOSIS — R2681 Unsteadiness on feet: Secondary | ICD-10-CM

## 2017-08-29 DIAGNOSIS — I700 Atherosclerosis of aorta: Secondary | ICD-10-CM

## 2017-08-29 DIAGNOSIS — R1319 Other dysphagia: Secondary | ICD-10-CM

## 2017-08-29 DIAGNOSIS — R1310 Dysphagia, unspecified: Secondary | ICD-10-CM

## 2017-08-29 NOTE — Patient Instructions (Addendum)
Medicare Annual Wellness Visit  [facility] and the medical providers at [facility] strive to bring you the best medical care.  In doing so we not only want to address your current medical conditions and concerns but also to detect new conditions early and prevent illness, disease and health-related problems.    Medicare offers a yearly Wellness Visit which allows our clinical staff to assess your need for preventative services including immunizations, lifestyle education, counseling to decrease risk of preventable diseases and screening for fall risk and other medical concerns.    This visit is provided free of charge (no copay) for all Medicare recipients. The clinical pharmacists at [facility] have begun to conduct these Wellness Visits which will also include a thorough review of all your medications.    As you primary medical provider recommend that you make an appointment for your Annual Wellness Visit if you have not done so already this year.  You may set up this appointment before you leave today or you may call back ([phone]) and schedule an appointment.  Please make sure when you call that you mention that you are scheduling your Annual Wellness Visit with the clinical pharmacist so that the appointment may be made for the proper length of time.     Continue current medications. Continue good therapeutic lifestyle changes which include good diet and exercise. Fall precautions discussed with patient. If an FOBT was given today- please return it to our front desk. If you are over 82 years old - you may need Prevnar 74 or the adult Pneumonia vaccine.  **Flu shots are available--- please call and schedule a FLU-CLINIC appointment**  After your visit with [name] today you will receive a survey in the mail or online from [employer] regarding your care with [name]. Please take a moment to fill this out. Your feedback is very  important to [name] as you can help [name] better understand your patient needs as well as improve your experience and satisfaction. WE CARE ABOUT YOU!!!   Continue to follow-up periodically with a cardiologist. Continue to use a walking stick or cane to ensure stability with walking. Remember that with spinal stenosis this causes legs to be weaker. Stay active physically as much as possible and try to walk regularly but in the cooler parts of the day. We will check lab work today and call you with those results as soon as they become available and we will make sure that the cardiologist gets a copy of the lab work results. Patient may need future referral to gastroenterology for endoscopy and dilatation especially if his swallowing gets worse and he will let us know this.++++ If he continues to have progressive leg weakness he may also need a referral to neurosurgery for any other options short of surgery to help with the leg weakness and spinal stenosis.++++ Keep appointment with ophthalmology for blepharoplasty that is planned in August through Dr. Zenia Resides office.

## 2017-08-29 NOTE — Progress Notes (Signed)
Subjective:    Patient ID: Jon Reid, male    DOB: 1929-02-28, 84 y.o.   MRN: [id number]  HPI Pt here for follow up and management of chronic medical problems which includes hypertension and hyperlipidemia. He is taking medication regularly.  The patient continues to complain of some fatigue and has had more falls recently.  He gets his rectal exam done by the urologist.  He was given an FOBT to return today and he will get a chest x-ray today and lab work today.  His blood pressure was slightly elevated on the systolic side.  His weight is down 7 pounds.  The pulse was 57.  He gets his B12 injection today.  This patient has prostate cancer GERD sleep apnea hypertension and chronic kidney disease.  He also has had a CABG.  His mother had colon cancer.  His father had hypertension and stroke.  One son, [name] has Parkinson's disease.  The patient had a right inguinal hernia repair in October of this past year.  His gait instability is probably mostly due to his spinal stenosis in the lumbar region which may be a major contributing factor along with ischemia to the pontine area affecting balance.  Denies any chest pain or increased shortness of breath.  He does have heartburn and he is taking Nexium for this.  He does have some trouble swallowing water and cornbread.  He has not seen any blood in the stool or had any black tarry bowel movements.  I offered him an opportunity to go see the gastroenterologist and he would prefer to wait on this for the time being but he understands if the problem gets worse that he will be referred for possible endoscopy and dilatation.  He has had a prostatectomy.  He does not see the urologist regularly.  He has good control of his bladder.  The patient has been sent a reminder notice to schedule an appointment with Dr. Percival Spanish, his cardiologist.  He also has myelodysplasia and will make sure that his hematologist, Dr. Earlie Server gets a copy of the blood work that is being  done today.  We offered him the possibility of seeing a neurosurgeon just because of the spinal stenosis to get another opinion if there are any other options to help the leg weakness short of surgery and he says he would prefer to wait on this until after the eye surgery for a blepharoplasty and August is done.   Patient Active Problem List   Diagnosis Date Noted  . Fatigue 11/14/2016  . Abnormality of gait 12/06/2015  . Abdominal aortic atherosclerosis ([place]) 12/01/2015  . Lumbar degenerative disc disease 12/01/2015  . Exertional angina ([place]) 07/20/2015  . Hernia of abdominal wall 10/12/2014  . Gynecomastia 04/09/2013  . Hx of CABG 02/02/2013  . 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 Myelodysplastic syndrome 11/06/2012  . Accelerating angina ([place]) 11/06/2012  . Vitamin B 12 deficiency 07/07/2012  . Hyperlipemia 07/07/2012  . Macrocytosis without anemia 01/08/2012  . PVD 04/14/2009  . CARCINOMA, PROSTATE 04/13/2009  . VASCULAR PURPURA 04/13/2009  . Benign essential HTN 04/13/2009  . Chronic renal insufficiency, stage III (moderate) ([place]) 04/13/2009  . SLEEP APNEA 04/13/2009  . ELECTROCARDIOGRAM, ABNORMAL 04/13/2009   Outpatient Encounter Medications as of 08/29/2017  Medication Sig  . aspirin EC 81 MG tablet Take 81 mg by mouth daily.  . Cholecalciferol (VITAMIN D) 2000 units CAPS Take 2,000 Units by mouth daily.  . Cyanocobalamin (B-12 COMPLIANCE INJECTION) 1000  MCG/ML KIT Inject as directed every 30 (thirty) days. Gets at Dr office  . fluticasone (FLONASE) 50 MCG/ACT nasal spray One to 2 sprays each nostril at bedtime (Patient taking differently: Place 1-2 sprays into both nostrils daily as needed for allergies or rhinitis (depends on congestion if does 1-2 sprays). )  . furosemide (LASIX) 40 MG tablet TAKE 1 TABLET DAILY  . metoprolol succinate (TOPROL-XL) 25 MG 24 hr tablet TAKE 1 TABLET DAILY  . nitroGLYCERIN (NITROSTAT) 0.4 MG SL tablet Place 1 tablet (0.4 mg total) under the tongue every  5 (five) minutes as needed for chest pain.  Marland Kitchen triamcinolone cream (KENALOG) 0.1 % Apply 1 application topically 2 (two) times daily.   Facility-Administered Encounter Medications as of 08/29/2017  Medication  . cyanocobalamin ((VITAMIN B-12)) injection 1,000 mcg      Review of Systems  Constitutional: Positive for fatigue.  HENT: Negative.   Eyes: Negative.   Respiratory: Negative.   Cardiovascular: Negative.   Gastrointestinal: Negative.   Endocrine: Negative.   Genitourinary: Negative.   Musculoskeletal: Negative.   Skin: Negative.   Allergic/Immunologic: Negative.   Neurological: Negative.   Hematological: Negative.   Psychiatric/Behavioral: Negative.        Objective:   Physical Exam  Constitutional: He is oriented to person, place, and time. He appears well-developed and well-nourished. No distress.  The patient is pleasant and may be has a slight decreased memory but can come up with the names eventually.  HENT:  Head: Normocephalic and atraumatic.  Right Ear: External ear normal.  Left Ear: External ear normal.  Nose: Nose normal.  Mouth/Throat: Oropharynx is clear and moist. No oropharyngeal exudate.  Eyes: Pupils are equal, round, and reactive to light. Conjunctivae and EOM are normal. Right eye exhibits no discharge. Left eye exhibits no discharge.  Blepharoplasty is planned in August because of redundant skin and vision impairment bilaterally.  Neck: Normal range of motion. Neck supple. No thyromegaly present.  No bruits thyromegaly or anterior cervical adenopathy  Cardiovascular: Normal rate, regular rhythm, normal heart sounds and intact distal pulses.  No murmur heard. The heart is regular at 60/min distal pulses were present but weak.  Pulmonary/Chest: Effort normal and breath sounds normal. No respiratory distress. He has no wheezes. He has no rales.  Clear anteriorly and posteriorly and patient has thoracic kyphosis.  Abdominal: Soft. Bowel sounds are  normal. He exhibits no mass. There is no tenderness. There is no guarding. No hernia.  No abdominal tenderness masses organ enlargement or bruits or inguinal adenopathy  Musculoskeletal: Normal range of motion. He exhibits deformity. He exhibits no edema.  Thoracic kyphosis  Lymphadenopathy:    He has no cervical adenopathy.  Neurological: He is alert and oriented to person, place, and time. He has normal reflexes. No cranial nerve deficit.  Skin: Skin is warm and dry. No rash noted.  Psychiatric: He has a normal mood and affect. His behavior is normal. Judgment and thought content normal.  Nursing note and vitals reviewed.  BP (!) 149/64 (BP Location: Left Arm)   Pulse (!) 57   Temp (!) 97.3 F (36.3 C) (Oral)   Ht 6' (1.829 m)   Wt 170 lb (77.1 kg)   BMI 23.06 kg/m         Assessment & Plan:  1. Pure hypercholesterolemia -Patient had problems with statin drugs and is no longer taking those.  He will continue to follow aggressive therapeutic lifestyle changes. - CBC with Differential/Platelet - Lipid panel -  DG Chest 2 View; Future  2. Benign essential HTN -The systolic blood pressure is slightly elevated today. - BMP8+EGFR - CBC with Differential/Platelet - Hepatic function panel - DG Chest 2 View; Future  3. B12 deficiency -Continue B12 injections. - CBC with Differential/Platelet - Vitamin B12  4. Vitamin D deficiency -Continue with vitamin D replacement pending results of lab work - CBC with Differential/Platelet - VITAMIN D 25 Hydroxy (Vit-D Deficiency, Fractures)  5. Prostate cancer ([place]) -Dr. Hessie Diener did his prostatectomy years ago.  He is doing well and not having any incontinence currently. - CBC with Differential/Platelet  6. Myelodysplastic syndrome -We will make sure that copies of lab work are sent to Dr. Earlie Server - CBC with Differential/Platelet  7. Other fatigue - CBC with Differential/Platelet - Thyroid Panel With TSH  8. Chronic  renal insufficiency, stage III (moderate) (H[facility]) -Keep blood pressure under good control watch sodium intake and stay with current medicines  9. Weakness of both lower extremities -Most likely secondary to lumbar spinal stenosis. -We will offer him an opportunity to go see neurosurgery to see if they have any options for better management and treatment other than surgery.  He will wait until after his eye surgery and let us know about this.  10. Gait instability -Most likely secondary to pontine issues and lumbar spinal stenosis  11. Abdominal aortic atherosclerosis ([place]) -Continue with aggressive therapeutic lifestyle changes including diet and exercise as much as possible  12. Hx of CABG -Follow-up with Dr. Percival Spanish as planned patient will make an appointment to be seen by him soon.  13.  Patient is having some dysphagia and if this continues will need to go and have an endoscopy and dilatation.  Dr. Collene Mares is his gastroenterologist  Patient Instructions                       Medicare Annual Wellness Visit  [facility] and the medical providers at [facility] strive to bring you the best medical care.  In doing so we not only want to address your current medical conditions and concerns but also to detect new conditions early and prevent illness, disease and health-related problems.    Medicare offers a yearly Wellness Visit which allows our clinical staff to assess your need for preventative services including immunizations, lifestyle education, counseling to decrease risk of preventable diseases and screening for fall risk and other medical concerns.    This visit is provided free of charge (no copay) for all Medicare recipients. The clinical pharmacists at [facility] have begun to conduct these Wellness Visits which will also include a thorough review of all your medications.    As you primary medical provider recommend that you make an  appointment for your Annual Wellness Visit if you have not done so already this year.  You may set up this appointment before you leave today or you may call back ([phone]) and schedule an appointment.  Please make sure when you call that you mention that you are scheduling your Annual Wellness Visit with the clinical pharmacist so that the appointment may be made for the proper length of time.     Continue current medications. Continue good therapeutic lifestyle changes which include good diet and exercise. Fall precautions discussed with patient. If an FOBT was given today- please return it to our front desk. If you are over 47 years old - you may need Prevnar 103 or the adult Pneumonia  vaccine.  **Flu shots are available--- please call and schedule a FLU-CLINIC appointment**  After your visit with [name] today you will receive a survey in the mail or online from [employer] regarding your care with [name]. Please take a moment to fill this out. Your feedback is very important to [name] as you can help [name] better understand your patient needs as well as improve your experience and satisfaction. WE CARE ABOUT YOU!!!   Continue to follow-up periodically with a cardiologist. Continue to use a walking stick or cane to ensure stability with walking. Remember that with spinal stenosis this causes legs to be weaker. Stay active physically as much as possible and try to walk regularly but in the cooler parts of the day. We will check lab work today and call you with those results as soon as they become available and we will make sure that the cardiologist gets a copy of the lab work results. Patient may need future referral to gastroenterology for endoscopy and dilatation especially if his swallowing gets worse and he will let us know this.++++ If he continues to have progressive leg weakness he may also need a referral to neurosurgery for any other options short of surgery to help with the leg weakness and spinal  stenosis.++++ Keep appointment with ophthalmology for blepharoplasty that is planned in August through Dr. Zenia Resides office.    Arrie Senate MD

## 2017-08-30 LAB — LIPID PANEL
CHOL/HDL RATIO: 2.6 ratio (ref 0.0–5.0)
Cholesterol, Total: 152 mg/dL (ref 100–199)
HDL: 59 mg/dL (ref >39)
LDL Calculated: 82 mg/dL (ref 0–99)
Triglycerides: 54 mg/dL (ref 0–149)
VLDL Cholesterol Cal: 11 mg/dL (ref 5–40)

## 2017-08-30 LAB — CBC WITH DIFFERENTIAL/PLATELET
BASOS: 1 %
Basophils Absolute: 0.1 10*3/uL (ref 0.0–0.2)
EOS (ABSOLUTE): 0.1 10*3/uL (ref 0.0–0.4)
EOS: 1 %
HEMATOCRIT: 35.8 % — AB (ref 37.5–51.0)
Hemoglobin: 11.9 g/dL — ABNORMAL LOW (ref 13.0–17.7)
IMMATURE GRANULOCYTES: 0 %
Immature Grans (Abs): 0 10*3/uL (ref 0.0–0.1)
LYMPHS ABS: 2 10*3/uL (ref 0.7–3.1)
Lymphs: 37 %
MCH: 31.7 pg (ref 26.6–33.0)
MCHC: 33.2 g/dL (ref 31.5–35.7)
MCV: 96 fL (ref 79–97)
MONOS ABS: 0.6 10*3/uL (ref 0.1–0.9)
Monocytes: 10 %
NEUTROS ABS: 2.7 10*3/uL (ref 1.4–7.0)
Neutrophils: 51 %
Platelets: 262 10*3/uL (ref 150–450)
RBC: 3.75 x10E6/uL — ABNORMAL LOW (ref 4.14–5.80)
RDW: 15.1 % (ref 12.3–15.4)
WBC: 5.4 10*3/uL (ref 3.4–10.8)

## 2017-08-30 LAB — BMP8+EGFR
BUN / CREAT RATIO: 12 (ref 10–24)
BUN: 17 mg/dL (ref 8–27)
CO2: 23 mmol/L (ref 20–29)
Calcium: 9.7 mg/dL (ref 8.6–10.2)
Chloride: 105 mmol/L (ref 96–106)
Creatinine, Ser: 1.46 mg/dL — ABNORMAL HIGH (ref 0.76–1.27)
GFR calc Af Amer: 49 mL/min/{1.73_m2} — ABNORMAL LOW (ref >59)
GFR, EST NON AFRICAN AMERICAN: 43 mL/min/{1.73_m2} — AB (ref >59)
Glucose: 94 mg/dL (ref 65–99)
Potassium: 4.7 mmol/L (ref 3.5–5.2)
SODIUM: 142 mmol/L (ref 134–144)

## 2017-08-30 LAB — VITAMIN D 25 HYDROXY (VIT D DEFICIENCY, FRACTURES): VIT D 25 HYDROXY: 42.7 ng/mL (ref 30.0–100.0)

## 2017-08-30 LAB — HEPATIC FUNCTION PANEL
ALBUMIN: 4.7 g/dL (ref 3.5–4.7)
ALK PHOS: 61 IU/L (ref 39–117)
ALT: 11 IU/L (ref 0–44)
AST: 21 IU/L (ref 0–40)
Bilirubin Total: 0.8 mg/dL (ref 0.0–1.2)
Bilirubin, Direct: 0.23 mg/dL (ref 0.00–0.40)
Total Protein: 6.5 g/dL (ref 6.0–8.5)

## 2017-08-30 LAB — VITAMIN B12: Vitamin B-12: 602 pg/mL (ref 232–1245)

## 2017-08-30 LAB — THYROID PANEL WITH TSH
Free Thyroxine Index: 1.8 (ref 1.2–4.9)
T3 UPTAKE RATIO: 26 % (ref 24–39)
T4, Total: 6.9 ug/dL (ref 4.5–12.0)
TSH: 1.95 u[IU]/mL (ref 0.450–4.500)

## 2017-09-12 ENCOUNTER — Encounter: Payer: Self-pay | Admitting: *Deleted

## 2017-09-30 ENCOUNTER — Ambulatory Visit (INDEPENDENT_AMBULATORY_CARE_PROVIDER_SITE_OTHER): Payer: MEDICARE | Admitting: *Deleted

## 2017-09-30 DIAGNOSIS — E538 Deficiency of other specified B group vitamins: Secondary | ICD-10-CM | POA: Diagnosis not present

## 2017-11-06 ENCOUNTER — Telehealth: Payer: Self-pay | Admitting: Family Medicine

## 2017-11-06 DIAGNOSIS — Z8546 Personal history of malignant neoplasm of prostate: Secondary | ICD-10-CM | POA: Diagnosis not present

## 2017-11-06 NOTE — Telephone Encounter (Signed)
Please advise 

## 2017-11-06 NOTE — Telephone Encounter (Signed)
appt made for pt tomorrow for weakness.

## 2017-11-07 ENCOUNTER — Ambulatory Visit (INDEPENDENT_AMBULATORY_CARE_PROVIDER_SITE_OTHER): Payer: MEDICARE | Admitting: Family Medicine

## 2017-11-07 ENCOUNTER — Encounter: Payer: Self-pay | Admitting: Family Medicine

## 2017-11-07 VITALS — BP 154/58 | HR 57 | Temp 97.3°F | Ht 72.0 in | Wt 169.0 lb

## 2017-11-07 DIAGNOSIS — D469 Myelodysplastic syndrome, unspecified: Secondary | ICD-10-CM

## 2017-11-07 DIAGNOSIS — E538 Deficiency of other specified B group vitamins: Secondary | ICD-10-CM

## 2017-11-07 DIAGNOSIS — R29898 Other symptoms and signs involving the musculoskeletal system: Secondary | ICD-10-CM | POA: Diagnosis not present

## 2017-11-07 DIAGNOSIS — E559 Vitamin D deficiency, unspecified: Secondary | ICD-10-CM | POA: Diagnosis not present

## 2017-11-07 DIAGNOSIS — M545 Low back pain: Secondary | ICD-10-CM | POA: Diagnosis not present

## 2017-11-07 DIAGNOSIS — R6889 Other general symptoms and signs: Secondary | ICD-10-CM | POA: Diagnosis not present

## 2017-11-07 DIAGNOSIS — R2681 Unsteadiness on feet: Secondary | ICD-10-CM

## 2017-11-07 DIAGNOSIS — C61 Malignant neoplasm of prostate: Secondary | ICD-10-CM | POA: Diagnosis not present

## 2017-11-07 NOTE — Patient Instructions (Addendum)
Follow-up with cardiology as planned Drink plenty of water to stay well-hydrated Use cane for gait instability.  This is a must. We will arrange for you to have an appointment with a neurosurgeon to see if there is a possibility that any shots in your back may give you some relief with improved strength in the lower extremity. It is important that he takes his ranitidine or Zantac twice daily and not once daily as this may help with the hoarseness that he is been experiencing.  This should be taken before breakfast and supper.

## 2017-11-07 NOTE — Progress Notes (Signed)
Subjective:    Patient ID: [name], male    DOB: Feb 09, 1929, 84 y.o.   MRN: [id number]  HPI Patient here today for general weakness and hoarseness.  The patient claims that his weakness is getting worse.  Also complains of hoarseness for 1 month.  He has had several falls.  He did see the urologist yesterday and they want [name] to do a PSA.  We will make sure they get a copy of this.  The patient does have a visit coming up with the cardiologist in September.  The patient's blood pressure at home is been running in the 120 range over the 60s.  His blood pressure here was 154/58.  The patient has had an MRI of the lumbar spine which demonstrated moderate to severe spinal stenosis and severe by foraminal stenosis at several levels.  An MRI of the brain also demonstrated moderate cortical atrophy.  The neurologist felt like the gait disturbance was multifactorial in nature associated with brain ischemia spinal stenosis and advanced age.  The last visit with the neurologist I believe was in June 2018.  The patient just complains of generalized weakness which is getting worse.  We emphasized to him that he needs to drink more water.  The patient today denies any chest pain or shortness of breath anymore than usual.  He does have some occasional tightness in his chest.  As long as he is taking the Zantac once a day he has not had any reflux symptoms or heartburn.  I did tell him this should be taken twice daily and that reflux could be contributing to his hoarseness.    Patient Active Problem List   Diagnosis Date Noted  . Fatigue 11/14/2016  . Abnormality of gait 12/06/2015  . Abdominal aortic atherosclerosis ([place]) 12/01/2015  . Lumbar degenerative disc disease 12/01/2015  . Exertional angina ([place]) 07/20/2015  . Hernia of abdominal wall 10/12/2014  . Gynecomastia 04/09/2013  . Hx of CABG 02/02/2013  . 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 Myelodysplastic syndrome 11/06/2012  . Accelerating angina ([place])  11/06/2012  . Vitamin B 12 deficiency 07/07/2012  . Hyperlipemia 07/07/2012  . Macrocytosis without anemia 01/08/2012  . PVD 04/14/2009  . CARCINOMA, PROSTATE 04/13/2009  . VASCULAR PURPURA 04/13/2009  . Benign essential HTN 04/13/2009  . Chronic renal insufficiency, stage III (moderate) ([place]) 04/13/2009  . SLEEP APNEA 04/13/2009  . ELECTROCARDIOGRAM, ABNORMAL 04/13/2009   Outpatient Encounter Medications as of 11/07/2017  Medication Sig  . aspirin EC 81 MG tablet Take 81 mg by mouth daily.  . Cholecalciferol (VITAMIN D) 2000 units CAPS Take 2,000 Units by mouth daily.  . Cyanocobalamin (B-12 COMPLIANCE INJECTION) 1000 MCG/ML KIT Inject as directed every 30 (thirty) days. Gets at Dr office  . fluticasone (FLONASE) 50 MCG/ACT nasal spray One to 2 sprays each nostril at bedtime (Patient taking differently: Place 1-2 sprays into both nostrils daily as needed for allergies or rhinitis (depends on congestion if does 1-2 sprays). )  . furosemide (LASIX) 40 MG tablet TAKE 1 TABLET DAILY  . metoprolol succinate (TOPROL-XL) 25 MG 24 hr tablet TAKE 1 TABLET DAILY  . nitroGLYCERIN (NITROSTAT) 0.4 MG SL tablet Place 1 tablet (0.4 mg total) under the tongue every 5 (five) minutes as needed for chest pain.  Marland Kitchen triamcinolone cream (KENALOG) 0.1 % Apply 1 application topically 2 (two) times daily.   Facility-Administered Encounter Medications as of 11/07/2017  Medication  . cyanocobalamin ((VITAMIN B-12)) injection 1,000 mcg  Review of Systems  Constitutional: Negative.   HENT: Positive for voice change (hoarse).   Eyes: Negative.   Respiratory: Negative.   Cardiovascular: Negative.   Gastrointestinal: Negative.   Endocrine: Negative.   Genitourinary: Negative.   Musculoskeletal: Negative.   Skin: Negative.   Allergic/Immunologic: Negative.   Neurological: Positive for weakness.  Hematological: Negative.   Psychiatric/Behavioral: Negative.        Objective:   Physical Exam    Constitutional: He is oriented to person, place, and time. He appears well-developed and well-nourished. No distress.  The patient today is very concerned about his progressive weakness.  I took a few minutes to try to explain the spinal stenosis and brain atrophy and how these things were working together to increase weakness in the lower extremities.  He seems to understand this.  He has been a man that has been active physically and is having a lot of trouble adjusting to this weakness.  HENT:  Head: Normocephalic and atraumatic.  Right Ear: External ear normal.  Left Ear: External ear normal.  Nose: Nose normal.  Mouth/Throat: Oropharynx is clear and moist. No oropharyngeal exudate.  Eyes: Pupils are equal, round, and reactive to light. Conjunctivae and EOM are normal. Right eye exhibits no discharge. Left eye exhibits no discharge. No scleral icterus.  Neck: Normal range of motion. Neck supple. No thyromegaly present.  No bruits thyromegaly or anterior cervical adenopathy  Cardiovascular: Normal rate, regular rhythm, normal heart sounds and intact distal pulses.  No murmur heard. Heart is regular at 60/min with good pedal pulses left weaker than the right.  Pulmonary/Chest: Effort normal and breath sounds normal. He has no wheezes. He has no rales.  Clear anteriorly and posteriorly and no axillary adenopathy or chest wall masses.  Abdominal: Soft. Bowel sounds are normal. He exhibits no mass. There is no tenderness. There is no rebound.  No abdominal tenderness organ enlargement bruits or masses.  Musculoskeletal: He exhibits no edema or tenderness.  Patient has a kyphotic posture.  The recent MRI of his spine and scan of his head were reviewed and those results were reviewed with the patient this was about a year ago when he saw the neurologist.  Lymphadenopathy:    He has no cervical adenopathy.  Neurological: He is alert and oriented to person, place, and time. He has normal reflexes.  No cranial nerve deficit.  Skin: Skin is warm and dry. No rash noted.  Psychiatric: He has a normal mood and affect. His behavior is normal. Judgment and thought content normal.  The patient's mood affect and behavior were normal for him and worried about his condition.  He was having trouble excepting the aging issues that are confronting him now.  Nursing note and vitals reviewed.  BP (!) 154/58 (BP Location: Left Arm)   Pulse (!) 57   Temp (!) 97.3 F (36.3 C) (Oral)   Ht 6' (1.829 m)   Wt 169 lb (76.7 kg)   BMI 22.92 kg/m         Assessment & Plan:  1. B12 deficiency -Get B12 shot if due and check B12 level. - CBC with Differential/Platelet  2. Vitamin D deficiency -Check vitamin D level if due. - CBC with Differential/Platelet  3. Prostate cancer ([place]) -Get PSA for her urologist. - CBC with Differential/Platelet - PSA, total and free  4. Myelodysplastic syndrome - CBC with Differential/Platelet  5. Weakness of both lower extremities -This is most likely from the spinal stenosis in the  back and the moderate brain atrophy per CT of the head. - BMP8+EGFR - CBC with Differential/Platelet - Thyroid Panel With TSH - Hepatic function panel - Ambulatory referral to Neurosurgery  6. Gait instability -Use a cane for walking - CBC with Differential/Platelet - Ambulatory referral to Neurosurgery  7. Low back pain, unspecified back pain laterality, unspecified chronicity, with sciatica presence unspecified -Take Tylenol for pain - Ambulatory referral to Neurosurgery  Patient Instructions  Follow-up with cardiology as planned Drink plenty of water to stay well-hydrated Use cane for gait instability.  This is a must. We will arrange for you to have an appointment with a neurosurgeon to see if there is a possibility that any shots in your back may give you some relief with improved strength in the lower extremity. It is important that he takes his ranitidine or Zantac  twice daily and not once daily as this may help with the hoarseness that he is been experiencing.  This should be taken before breakfast and supper.  Arrie Senate MD

## 2017-11-08 ENCOUNTER — Other Ambulatory Visit: Payer: Self-pay | Admitting: *Deleted

## 2017-11-08 DIAGNOSIS — M5126 Other intervertebral disc displacement, lumbar region: Secondary | ICD-10-CM

## 2017-11-08 DIAGNOSIS — M4807 Spinal stenosis, lumbosacral region: Secondary | ICD-10-CM

## 2017-11-08 DIAGNOSIS — R29898 Other symptoms and signs involving the musculoskeletal system: Secondary | ICD-10-CM

## 2017-11-08 DIAGNOSIS — M5136 Other intervertebral disc degeneration, lumbar region: Secondary | ICD-10-CM

## 2017-11-08 LAB — HEPATIC FUNCTION PANEL
ALT: 9 IU/L (ref 0–44)
AST: 21 IU/L (ref 0–40)
Albumin: 4.6 g/dL (ref 3.5–4.7)
Alkaline Phosphatase: 62 IU/L (ref 39–117)
BILIRUBIN TOTAL: 0.6 mg/dL (ref 0.0–1.2)
Bilirubin, Direct: 0.18 mg/dL (ref 0.00–0.40)
Total Protein: 6.8 g/dL (ref 6.0–8.5)

## 2017-11-08 LAB — CBC WITH DIFFERENTIAL/PLATELET
Basophils Absolute: 0.1 10*3/uL (ref 0.0–0.2)
Basos: 1 %
EOS (ABSOLUTE): 0.1 10*3/uL (ref 0.0–0.4)
Eos: 2 %
Hematocrit: 35.1 % — ABNORMAL LOW (ref 37.5–51.0)
Hemoglobin: 11.7 g/dL — ABNORMAL LOW (ref 13.0–17.7)
IMMATURE GRANS (ABS): 0 10*3/uL (ref 0.0–0.1)
IMMATURE GRANULOCYTES: 0 %
Lymphocytes Absolute: 1.7 10*3/uL (ref 0.7–3.1)
Lymphs: 32 %
MCH: 32.4 pg (ref 26.6–33.0)
MCHC: 33.3 g/dL (ref 31.5–35.7)
MCV: 97 fL (ref 79–97)
MONOS ABS: 0.6 10*3/uL (ref 0.1–0.9)
Monocytes: 11 %
NEUTROS PCT: 54 %
Neutrophils Absolute: 2.8 10*3/uL (ref 1.4–7.0)
Platelets: 252 10*3/uL (ref 150–450)
RBC: 3.61 x10E6/uL — ABNORMAL LOW (ref 4.14–5.80)
RDW: 14.6 % (ref 12.3–15.4)
WBC: 5.2 10*3/uL (ref 3.4–10.8)

## 2017-11-08 LAB — THYROID PANEL WITH TSH
FREE THYROXINE INDEX: 1.6 (ref 1.2–4.9)
T3 UPTAKE RATIO: 27 % (ref 24–39)
T4 TOTAL: 6 ug/dL (ref 4.5–12.0)
TSH: 1.87 u[IU]/mL (ref 0.450–4.500)

## 2017-11-08 LAB — BMP8+EGFR
BUN/Creatinine Ratio: 13 (ref 10–24)
BUN: 18 mg/dL (ref 8–27)
CALCIUM: 9.1 mg/dL (ref 8.6–10.2)
CO2: 21 mmol/L (ref 20–29)
Chloride: 104 mmol/L (ref 96–106)
Creatinine, Ser: 1.44 mg/dL — ABNORMAL HIGH (ref 0.76–1.27)
GFR calc Af Amer: 50 mL/min/{1.73_m2} — ABNORMAL LOW (ref >59)
GFR, EST NON AFRICAN AMERICAN: 43 mL/min/{1.73_m2} — AB (ref >59)
GLUCOSE: 96 mg/dL (ref 65–99)
POTASSIUM: 4.4 mmol/L (ref 3.5–5.2)
SODIUM: 141 mmol/L (ref 134–144)

## 2017-11-08 LAB — PSA, TOTAL AND FREE
PSA, Free: <0.02 ng/mL
Prostate Specific Ag, Serum: <0.1 ng/mL (ref 0.0–4.0)

## 2017-11-15 ENCOUNTER — Ambulatory Visit (HOSPITAL_COMMUNITY)
Admission: RE | Admit: 2017-11-15 | Discharge: 2017-11-15 | Disposition: A | Discharge status: Home / Self Care | Payer: MEDICARE | Source: Ambulatory Visit | Attending: Family Medicine | Admitting: Family Medicine

## 2017-11-15 DIAGNOSIS — R29898 Other symptoms and signs involving the musculoskeletal system: Secondary | ICD-10-CM

## 2017-11-15 DIAGNOSIS — M4807 Spinal stenosis, lumbosacral region: Secondary | ICD-10-CM | POA: Diagnosis not present

## 2017-11-15 DIAGNOSIS — M5126 Other intervertebral disc displacement, lumbar region: Secondary | ICD-10-CM | POA: Diagnosis not present

## 2017-11-15 DIAGNOSIS — M48061 Spinal stenosis, lumbar region without neurogenic claudication: Secondary | ICD-10-CM | POA: Diagnosis not present

## 2017-11-15 DIAGNOSIS — M47816 Spondylosis without myelopathy or radiculopathy, lumbar region: Secondary | ICD-10-CM | POA: Insufficient documentation

## 2017-11-15 DIAGNOSIS — M545 Low back pain: Secondary | ICD-10-CM | POA: Diagnosis not present

## 2017-11-15 DIAGNOSIS — M5136 Other intervertebral disc degeneration, lumbar region: Secondary | ICD-10-CM

## 2017-11-19 ENCOUNTER — Other Ambulatory Visit: Payer: Self-pay | Admitting: *Deleted

## 2017-11-19 DIAGNOSIS — R29898 Other symptoms and signs involving the musculoskeletal system: Secondary | ICD-10-CM

## 2017-11-19 DIAGNOSIS — M5136 Other intervertebral disc degeneration, lumbar region: Secondary | ICD-10-CM

## 2017-11-19 DIAGNOSIS — M479 Spondylosis, unspecified: Secondary | ICD-10-CM

## 2017-11-19 DIAGNOSIS — M4807 Spinal stenosis, lumbosacral region: Secondary | ICD-10-CM

## 2017-11-19 DIAGNOSIS — M48061 Spinal stenosis, lumbar region without neurogenic claudication: Secondary | ICD-10-CM

## 2017-11-19 DIAGNOSIS — M5126 Other intervertebral disc displacement, lumbar region: Secondary | ICD-10-CM

## 2017-12-03 NOTE — Progress Notes (Signed)
Cardiology Office Note   Date:  12/04/2017   ID:  Jon Reid, DOB 1929/01/20, MRN [id number]  PCP:  Chipper Herb, MD  Cardiologist:   Minus Breeding, MD  Referring:  Chipper Herb, MD  Chief Complaint  Patient presents with  . Coronary Artery Disease      History of Present Illness: Jon Reid is a 84 y.o. male who presents for follow up of CAD.  He has CABG in 2014.  In 2017 he had a negative stress perfusion study.  Since I last saw him he had an inguinal hernia repair.    His biggest complaint is fatigue.  He says some days he feels okay but times he feels tired he can barely get up and go.  He has trouble with his gait.  He previously told me he was not sleeping well and he had stress related to his antique cars in his car a lot but he says now he does not recall that.  He is not having any new shortness of breath, PND or orthopnea.  Is not having any new palpitations, presyncope or syncope.  He does have some increased lower extremity swelling but he did not want to take Lasix every day because it made him urinate too much.  Past Medical History:  Diagnosis Date  . Carcinoma of prostate [facility])    prostate  . CKD (chronic kidney disease) stage 2, GFR 60-89 ml/min   . Coronary artery disease    a. LHC (8/14):  3v CAD => s/p CABG (LIMA-LAD, SVG-OM1, SVG-PDA)  . GERD (gastroesophageal reflux disease)   . Gout   . HTN (hypertension)   . Hx of echocardiogram    Echocardiogram 11/13/12: EF 55-60%, normal wall motion, biatrial enlargement, mild AI, mild TR, no pericardial effusion  . Sleep apnea    Questionable    Past Surgical History:  Procedure Laterality Date  . CARDIAC CATHETERIZATION  11/07/2012   Dr Acie Fredrickson  . CORONARY ARTERY BYPASS GRAFT N/A 11/11/2012   Procedure: CORONARY ARTERY BYPASS GRAFTING times four using Right Greater Saphenous Vein Graft harvested endoscopically and Left Internal Mammary Artery.;  Surgeon: Ivin Poot, MD;  Location: [place];   Service: Open Heart Surgery;  Laterality: N/A;  . INGUINAL HERNIA REPAIR Right 01/10/2017   Procedure: OPEN RIGHT HERNIA REPAIR INGUINAL;  Surgeon: Ileana Roup, MD;  Location: [facility];  Service: General;  Laterality: Right;  . INSERTION OF MESH Right 01/10/2017   Procedure: INSERTION OF MESH;  Surgeon: Ileana Roup, MD;  Location: [facility];  Service: General;  Laterality: Right;  . INTRAOPERATIVE TRANSESOPHAGEAL ECHOCARDIOGRAM N/A 11/11/2012   Procedure: INTRAOPERATIVE TRANSESOPHAGEAL ECHOCARDIOGRAM;  Surgeon: Ivin Poot, MD;  Location: [place];  Service: Open Heart Surgery;  Laterality: N/A;  . LEFT HEART CATHETERIZATION WITH CORONARY ANGIOGRAM N/A 11/07/2012   Procedure: LEFT HEART CATHETERIZATION WITH CORONARY ANGIOGRAM;  Surgeon: Thayer Headings, MD;  Location: [facility] CATH LAB;  Service: Cardiovascular;  Laterality: N/A;  . LYMPH NODE DISSECTION     Bilateral pelvic  . RETROPUBIC PROSTATECTOMY     Radical     Current Outpatient Medications  Medication Sig Dispense Refill  . aspirin EC 81 MG tablet Take 81 mg by mouth daily.    . Cholecalciferol (VITAMIN D) 2000 units CAPS Take 2,000 Units by mouth daily.    . fluticasone (FLONASE) 50 MCG/ACT nasal spray One to 2 sprays each nostril at bedtime (Patient taking differently: Place 1-2  sprays into both nostrils daily as needed for allergies or rhinitis (depends on congestion if does 1-2 sprays). ) 16 g 6  . furosemide (LASIX) 40 MG tablet TAKE 1 TABLET DAILY 30 tablet 2  . metoprolol succinate (TOPROL-XL) 25 MG 24 hr tablet TAKE 1 TABLET DAILY 30 tablet 11  . nitroGLYCERIN (NITROSTAT) 0.4 MG SL tablet Place 1 tablet (0.4 mg total) under the tongue every 5 (five) minutes as needed for chest pain. 25 tablet PRN  . triamcinolone cream (KENALOG) 0.1 % Apply 1 application topically 2 (two) times daily. 30 g 3   Current Facility-Administered Medications  Medication Dose Route Frequency Provider Last Rate Last Dose  . cyanocobalamin  ((VITAMIN B-12)) injection 1,000 mcg  1,000 mcg Intramuscular Q30 days Chipper Herb, MD   1,000 mcg at 11/07/17 1207    Allergies:   Crestor [rosuvastatin]    ROS:  Please see the history of present illness.   Otherwise, review of systems are positive for none.   All other systems are reviewed and negative.    PHYSICAL EXAM: VS:  BP 110/60   Pulse (!) 57   Ht 6' (1.829 m)   Wt 170 lb (77.1 kg)   BMI 23.06 kg/m  , BMI Body mass index is 23.06 kg/m. GENERAL:  Well appearing NECK:  No jugular venous distention, waveform within normal limits, carotid upstroke brisk and symmetric, no bruits, no thyromegaly LUNGS:  Clear to auscultation bilaterally CHEST:  Unremarkable HEART:  PMI not displaced or sustained,S1 and S2 within normal limits, no S3, no S4, no clicks, no rubs, 2 out of 6 apical systolic murmur heard best at the third left intercostal space, no diastolic murmurs ABD:  Flat, positive bowel sounds normal in frequency in pitch, no bruits, no rebound, no guarding, no midline pulsatile mass, no hepatomegaly, no splenomegaly EXT:  2 plus pulses throughout, mild bilateral ankle edema, no cyanosis no clubbing   EKG:  EKG is  ordered today. The ekg ordered today demonstrates NSR, LAD, rate 57, LAD, intervals WNL, no acute ST T wave changes.  No change from previous.   Recent Labs: 11/07/2017: ALT 9; BUN 18; Creatinine, Ser 1.44; Hemoglobin 11.7; Platelets 252; Potassium 4.4; Sodium 141; TSH 1.870    Lipid Panel    Component Value Date/Time   CHOL 152 08/29/2017 1153   CHOL 173 11/06/2012 1058   TRIG 54 08/29/2017 1153   TRIG 55 07/07/2015 0945   TRIG 73 11/06/2012 1058   HDL 59 08/29/2017 1153   HDL 54 07/07/2015 0945   HDL 66 11/06/2012 1058   CHOLHDL 2.6 08/29/2017 1153   CHOLHDL 2.5 11/08/2012 0500   VLDL 11 11/08/2012 0500   LDLCALC 82 08/29/2017 1153   LDLCALC 92 09/03/2013 0959   LDLCALC 92 11/06/2012 1058      Wt Readings from Last 3 Encounters:  12/04/17  170 lb (77.1 kg)  11/07/17 169 lb (76.7 kg)  08/29/17 170 lb (77.1 kg)      Other studies Reviewed: Additional studies/ records that were reviewed today include: Echo 2014. Review of the above records demonstrates:    Lab Results  Component Value Date   TSH 1.870 11/07/2017   Lab Results  Component Value Date   CREATININE 1.44 (H) 11/07/2017   ASSESSMENT AND PLAN:   Fatigue Fatigue continues to be the biggest complaint.  He has some mild anemia but this has been stable.  Thyroid was normal previously.  I do not see a clear etiology  to this.  I will defer further work-up to Chipper Herb, MD  Edema We discussed conservative strategies.  I suggested at least for a while he take his Lasix every day again as was prescribed.  I will follow-up with an echo as below.  Hx of CABG He had a negative stress test in 2017.  He is not having any new symptoms consistent with ischemia so no work-up is planned.  Benign essential HTN The blood pressure is at target.  No change in therapy.  Chronic renal insufficiency, stage III (moderate) Creat has been stable at 1.44.  No change in therapy.  Murmur Given this and his edema I will check an echocardiogram.  Hyperlipemia  LDL was 82.    No change in therapy.  Current medicines are reviewed at length with the patient today.  The patient does not have concerns regarding medicines.  The following changes have been made:  None  Labs/ tests ordered today include:   Orders Placed This Encounter  Procedures  . EKG 12-Lead  . ECHOCARDIOGRAM COMPLETE     Disposition:   FU with me in 12 months   Signed, Minus Breeding, MD  12/04/2017 3:40 PM    [facility]

## 2017-12-04 ENCOUNTER — Encounter: Payer: Self-pay | Admitting: Cardiology

## 2017-12-04 ENCOUNTER — Ambulatory Visit (INDEPENDENT_AMBULATORY_CARE_PROVIDER_SITE_OTHER): Payer: MEDICARE | Admitting: Cardiology

## 2017-12-04 VITALS — BP 110/60 | HR 57 | Ht 72.0 in | Wt 170.0 lb

## 2017-12-04 DIAGNOSIS — R011 Cardiac murmur, unspecified: Secondary | ICD-10-CM | POA: Diagnosis not present

## 2017-12-04 DIAGNOSIS — I10 Essential (primary) hypertension: Secondary | ICD-10-CM

## 2017-12-04 DIAGNOSIS — I493 Ventricular premature depolarization: Secondary | ICD-10-CM

## 2017-12-04 DIAGNOSIS — I2510 Atherosclerotic heart disease of native coronary artery without angina pectoris: Secondary | ICD-10-CM

## 2017-12-04 NOTE — Patient Instructions (Signed)
Medication Instructions:  The current medical regimen is effective;  continue present plan and medications.  Testing/Procedures: Your physician has requested that you have an echocardiogram. Echocardiography is a painless test that uses sound waves to create images of your heart. It provides your doctor with information about the size and shape of your heart and how well your heart's chambers and valves are working. This procedure takes approximately one hour. There are no restrictions for this procedure.  Follow-Up: Follow up in 1 year with Dr. Hochrein.  You will receive a letter in the mail 2 months before you are due.  Please call us when you receive this letter to schedule your follow up appointment.  If you need a refill on your cardiac medications before your next appointment, please call your pharmacy.  Thank you for choosing [facility]!!     

## 2017-12-06 ENCOUNTER — Ambulatory Visit (HOSPITAL_COMMUNITY)
Admission: RE | Admit: 2017-12-06 | Discharge: 2017-12-06 | Disposition: A | Discharge status: Home / Self Care | Payer: MEDICARE | Source: Ambulatory Visit | Attending: Cardiology | Admitting: Cardiology

## 2017-12-06 DIAGNOSIS — E785 Hyperlipidemia, unspecified: Secondary | ICD-10-CM | POA: Diagnosis not present

## 2017-12-06 DIAGNOSIS — R42 Dizziness and giddiness: Secondary | ICD-10-CM | POA: Diagnosis not present

## 2017-12-06 DIAGNOSIS — I10 Essential (primary) hypertension: Secondary | ICD-10-CM | POA: Diagnosis not present

## 2017-12-06 DIAGNOSIS — R51 Headache: Secondary | ICD-10-CM | POA: Insufficient documentation

## 2017-12-06 DIAGNOSIS — I081 Rheumatic disorders of both mitral and tricuspid valves: Secondary | ICD-10-CM | POA: Diagnosis not present

## 2017-12-06 DIAGNOSIS — I493 Ventricular premature depolarization: Secondary | ICD-10-CM | POA: Diagnosis not present

## 2017-12-06 DIAGNOSIS — I350 Nonrheumatic aortic (valve) stenosis: Secondary | ICD-10-CM

## 2017-12-06 DIAGNOSIS — H532 Diplopia: Secondary | ICD-10-CM | POA: Insufficient documentation

## 2017-12-06 DIAGNOSIS — R011 Cardiac murmur, unspecified: Secondary | ICD-10-CM | POA: Diagnosis not present

## 2017-12-06 HISTORY — DX: Nonrheumatic aortic (valve) stenosis: I35.0

## 2017-12-06 NOTE — Progress Notes (Signed)
*  PRELIMINARY RESULTS* Echocardiogram 2D Echocardiogram has been performed.  Jon Reid 12/06/2017, 9:45 AM

## 2017-12-10 DIAGNOSIS — G959 Disease of spinal cord, unspecified: Secondary | ICD-10-CM | POA: Diagnosis not present

## 2017-12-10 DIAGNOSIS — R269 Unspecified abnormalities of gait and mobility: Secondary | ICD-10-CM | POA: Diagnosis not present

## 2017-12-11 ENCOUNTER — Ambulatory Visit (INDEPENDENT_AMBULATORY_CARE_PROVIDER_SITE_OTHER): Payer: MEDICARE

## 2017-12-11 ENCOUNTER — Other Ambulatory Visit (HOSPITAL_COMMUNITY): Payer: Self-pay | Admitting: Student

## 2017-12-11 DIAGNOSIS — E538 Deficiency of other specified B group vitamins: Secondary | ICD-10-CM

## 2017-12-11 DIAGNOSIS — G959 Disease of spinal cord, unspecified: Secondary | ICD-10-CM

## 2017-12-11 NOTE — Progress Notes (Signed)
B-12 injection given to right deltoid, patient tolerated well. 

## 2017-12-17 ENCOUNTER — Ambulatory Visit (HOSPITAL_COMMUNITY)
Admission: RE | Admit: 2017-12-17 | Discharge: 2017-12-17 | Disposition: A | Discharge status: Home / Self Care | Payer: MEDICARE | Source: Ambulatory Visit | Attending: Student | Admitting: Student

## 2017-12-17 DIAGNOSIS — M4802 Spinal stenosis, cervical region: Secondary | ICD-10-CM | POA: Diagnosis not present

## 2017-12-17 DIAGNOSIS — G959 Disease of spinal cord, unspecified: Secondary | ICD-10-CM | POA: Diagnosis not present

## 2017-12-17 DIAGNOSIS — M4712 Other spondylosis with myelopathy, cervical region: Secondary | ICD-10-CM | POA: Insufficient documentation

## 2017-12-17 DIAGNOSIS — M50223 Other cervical disc displacement at C6-C7 level: Secondary | ICD-10-CM | POA: Diagnosis not present

## 2017-12-17 DIAGNOSIS — M47812 Spondylosis without myelopathy or radiculopathy, cervical region: Secondary | ICD-10-CM | POA: Diagnosis not present

## 2017-12-17 DIAGNOSIS — M47892 Other spondylosis, cervical region: Secondary | ICD-10-CM | POA: Insufficient documentation

## 2018-01-01 ENCOUNTER — Ambulatory Visit (INDEPENDENT_AMBULATORY_CARE_PROVIDER_SITE_OTHER): Payer: MEDICARE | Admitting: Family Medicine

## 2018-01-01 ENCOUNTER — Encounter: Payer: Self-pay | Admitting: Family Medicine

## 2018-01-01 VITALS — BP 150/68 | HR 61 | Temp 97.0°F | Ht 72.0 in | Wt 171.0 lb

## 2018-01-01 DIAGNOSIS — Z23 Encounter for immunization: Secondary | ICD-10-CM

## 2018-01-01 DIAGNOSIS — D469 Myelodysplastic syndrome, unspecified: Secondary | ICD-10-CM | POA: Diagnosis not present

## 2018-01-01 DIAGNOSIS — M4807 Spinal stenosis, lumbosacral region: Secondary | ICD-10-CM | POA: Diagnosis not present

## 2018-01-01 DIAGNOSIS — I10 Essential (primary) hypertension: Secondary | ICD-10-CM | POA: Diagnosis not present

## 2018-01-01 DIAGNOSIS — D692 Other nonthrombocytopenic purpura: Secondary | ICD-10-CM | POA: Diagnosis not present

## 2018-01-01 DIAGNOSIS — R29898 Other symptoms and signs involving the musculoskeletal system: Secondary | ICD-10-CM | POA: Diagnosis not present

## 2018-01-01 DIAGNOSIS — M48061 Spinal stenosis, lumbar region without neurogenic claudication: Secondary | ICD-10-CM | POA: Diagnosis not present

## 2018-01-01 DIAGNOSIS — E559 Vitamin D deficiency, unspecified: Secondary | ICD-10-CM

## 2018-01-01 DIAGNOSIS — I739 Peripheral vascular disease, unspecified: Secondary | ICD-10-CM | POA: Diagnosis not present

## 2018-01-01 DIAGNOSIS — I2510 Atherosclerotic heart disease of native coronary artery without angina pectoris: Secondary | ICD-10-CM

## 2018-01-01 DIAGNOSIS — E7800 Pure hypercholesterolemia, unspecified: Secondary | ICD-10-CM | POA: Diagnosis not present

## 2018-01-01 DIAGNOSIS — C61 Malignant neoplasm of prostate: Secondary | ICD-10-CM

## 2018-01-01 DIAGNOSIS — E538 Deficiency of other specified B group vitamins: Secondary | ICD-10-CM

## 2018-01-01 NOTE — Progress Notes (Signed)
Subjective:    Patient ID: Jon Reid, male    DOB: 06-04-1928, 84 y.o.   MRN: [id number]  HPI Pt here for follow up and management of chronic medical problems which includes hypertension and hyperlipidemia. He is taking medication regularly.  Continues to see the cardiologist because of his history of heart disease and CABG.  He is not can see him back for 1 year.  He is recently seen someone in neurosurgery but has not heard from them about anything that can be done to help his back short of surgery.  He did see Dr. Arnoldo Morale.  His biggest complaint is weakness especially in his legs to the point where he almost falls and has fallen on several occasions.  The MRI of his neck and lumbar spine were reviewed that have been done recently and the radiologist said that there are progressive changes in the cervical spine MRI and the changes in the bar spine MRI are about the same.  There is spondylosis facet arthritis and especially in the low back severe spinal stenosis at a couple of levels.  We will talk to Dr. Arnoldo Morale and see if he can have a conversation with the patient regarding any options of what to do short of major surgery.  The patient today does deny any chest pain pressure or tightness.  He says he does get somewhat short of breath with talking.  He denies any trouble with his stomach as long as he is taking Zantac.  He was reminded today to stop the Zantac and switch to Pepcid AC instead.  He says his urinary stream is slow and he has had a prostatectomy and did see the urologist a couple of months ago and the urologist did not have anything else to offer for the frequency and urgency.    Patient Active Problem List   Diagnosis Date Noted  . Murmur, cardiac 12/04/2017  . Coronary artery disease involving native coronary artery of native heart without angina pectoris 12/04/2017  . PVC's (premature ventricular contractions) 12/04/2017  . Fatigue 11/14/2016  . Abnormality of gait 12/06/2015   . Abdominal aortic atherosclerosis ([place]) 12/01/2015  . Lumbar degenerative disc disease 12/01/2015  . Exertional angina ([place]) 07/20/2015  . Hernia of abdominal wall 10/12/2014  . Gynecomastia 04/09/2013  . Hx of CABG 02/02/2013  . 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 Myelodysplastic syndrome 11/06/2012  . Accelerating angina ([place]) 11/06/2012  . Vitamin B 12 deficiency 07/07/2012  . Hyperlipemia 07/07/2012  . Macrocytosis without anemia 01/08/2012  . PVD 04/14/2009  . CARCINOMA, PROSTATE 04/13/2009  . VASCULAR PURPURA 04/13/2009  . Benign essential HTN 04/13/2009  . Chronic renal insufficiency, stage III (moderate) ([place]) 04/13/2009  . SLEEP APNEA 04/13/2009  . ELECTROCARDIOGRAM, ABNORMAL 04/13/2009   Outpatient Encounter Medications as of 01/01/2018  Medication Sig  . aspirin EC 81 MG tablet Take 81 mg by mouth daily.  . Cholecalciferol (VITAMIN D) 2000 units CAPS Take 2,000 Units by mouth daily.  . fluticasone (FLONASE) 50 MCG/ACT nasal spray One to 2 sprays each nostril at bedtime (Patient taking differently: Place 1-2 sprays into both nostrils daily as needed for allergies or rhinitis (depends on congestion if does 1-2 sprays). )  . furosemide (LASIX) 40 MG tablet TAKE 1 TABLET DAILY  . metoprolol succinate (TOPROL-XL) 25 MG 24 hr tablet TAKE 1 TABLET DAILY  . nitroGLYCERIN (NITROSTAT) 0.4 MG SL tablet Place 1 tablet (0.4 mg total) under the tongue every 5 (five) minutes as needed  for chest pain.  Marland Kitchen triamcinolone cream (KENALOG) 0.1 % Apply 1 application topically 2 (two) times daily.   Facility-Administered Encounter Medications as of 01/01/2018  Medication  . cyanocobalamin ((VITAMIN B-12)) injection 1,000 mcg     Review of Systems  Constitutional: Negative.   HENT: Negative.   Eyes: Negative.   Respiratory: Negative.   Cardiovascular: Negative.   Gastrointestinal: Negative.   Endocrine: Negative.   Genitourinary: Negative.   Musculoskeletal: Negative.   Skin: Negative.     Allergic/Immunologic: Negative.   Neurological: Positive for weakness.  Hematological: Negative.   Psychiatric/Behavioral: Negative.        Objective:   Physical Exam  Constitutional: He is oriented to person, place, and time. He appears well-developed and well-nourished. No distress.  The patient is alert but remains frustrated with his inability to be as active as he used to be and with his increase weakness in his lower extremities.  HENT:  Head: Normocephalic and atraumatic.  Right Ear: External ear normal.  Left Ear: External ear normal.  Nose: Nose normal.  Mouth/Throat: Oropharynx is clear and moist. No oropharyngeal exudate.  Eyes: Pupils are equal, round, and reactive to light. Conjunctivae and EOM are normal. Right eye exhibits no discharge. Left eye exhibits no discharge. No scleral icterus.  Patient has a redundant eyelid skin which is impacting his ability to see well and insurance has denied repairing this.  He will continue to discuss this with his ophthalmologist.  Neck: Normal range of motion. Neck supple. No thyromegaly present.  No bruits thyromegaly or anterior cervical adenopathy  Cardiovascular: Normal rate and regular rhythm.  Murmur heard. The heart has a regular rate and rhythm at 60/min with a grade 2/6 systolic ejection murmur.  Diminished pulses in left foot compared to right foot.  Pulmonary/Chest: Effort normal and breath sounds normal. He has no wheezes. He has no rales. He exhibits no tenderness.  Clear anteriorly and posteriorly  Abdominal: Soft. Bowel sounds are normal. He exhibits no mass. There is no tenderness.  No abdominal tenderness masses organ enlargement or bruits  Musculoskeletal: Normal range of motion. He exhibits no edema or tenderness.  Some gait instability and limited range of motion  Lymphadenopathy:    He has no cervical adenopathy.  Neurological: He is alert and oriented to person, place, and time. He has normal reflexes. No  cranial nerve deficit.  Skin: Skin is warm and dry. No rash noted.  Senile purpura both arms  Psychiatric: He has a normal mood and affect. His behavior is normal. Judgment and thought content normal.  The patient's mood affect and behavior are all normal for him.  We spent more time today re-explaining the reasons for his lower extremity weakness.  Nursing note and vitals reviewed.  BP (!) 150/68 (BP Location: Left Arm)   Pulse 61   Temp (!) 97 F (36.1 C) (Oral)   Ht 6' (1.829 m)   Wt 171 lb (77.6 kg)   BMI 23.19 kg/m         Assessment & Plan:  1. Benign essential HTN -The patient's blood pressures run well at home but always high in the office.  No change in treatment.  He has seen the cardiologist recently.  2. Pure hypercholesterolemia -Continue with aggressive therapeutic lifestyle changes as patient had problems with taking some of the statin drugs.  3. Prostate cancer [facility]) -Patient continues to follow-up with the urologist and was not given any further treatment suggestions for his slow stream at  the last visit.  4. Vitamin D deficiency -Continue with vitamin D replacement pending results of lab work  5. Vitamin B 12 deficiency -Continue with B12 injections  6. Spinal stenosis of lumbosacral region -Follow-up with Dr. Arnoldo Morale  7. Weakness of both lower extremities -Follow-up with Dr. Arnoldo Morale  8. Foraminal stenosis of lumbar region -Follow-up with Dr. Arnoldo Morale  9. Encounter for immunization - Flu vaccine HIGH DOSE PF  10.  Senile purpura -These are present on both arms.  11. Peripheral vascular insufficiency (HCC) -Diminished pulses in the foot of the left lower extremity  12. Myelodysplastic syndrome -Follow-up with hematology as needed  Patient Instructions                       Medicare Annual Wellness Visit  [facility] and the medical providers at [facility] strive to bring you the best medical care.  In doing so we not  only want to address your current medical conditions and concerns but also to detect new conditions early and prevent illness, disease and health-related problems.    Medicare offers a yearly Wellness Visit which allows our clinical staff to assess your need for preventative services including immunizations, lifestyle education, counseling to decrease risk of preventable diseases and screening for fall risk and other medical concerns.    This visit is provided free of charge (no copay) for all Medicare recipients. The clinical pharmacists at [facility] have begun to conduct these Wellness Visits which will also include a thorough review of all your medications.    As you primary medical provider recommend that you make an appointment for your Annual Wellness Visit if you have not done so already this year.  You may set up this appointment before you leave today or you may call back ([phone]) and schedule an appointment.  Please make sure when you call that you mention that you are scheduling your Annual Wellness Visit with the clinical pharmacist so that the appointment may be made for the proper length of time.     Continue current medications. Continue good therapeutic lifestyle changes which include good diet and exercise. Fall precautions discussed with patient. If an FOBT was given today- please return it to our front desk. If you are over 65 years old - you may need Prevnar 43 or the adult Pneumonia vaccine.  **Flu shots are available--- please call and schedule a FLU-CLINIC appointment**  After your visit with [name] today you will receive a survey in the mail or online from [employer] regarding your care with [name]. Please take a moment to fill this out. Your feedback is very important to [name] as you can help [name] better understand your patient needs as well as improve your experience and satisfaction. WE CARE ABOUT YOU!!!   When walking, please use a cane Maybe, you should  restrict yourself and avoid leaf blowing. Follow-up with neurosurgery Moves slowly and do not do any climbing Discuss with neurosurgery the extreme weakness that you are having in your legs Remember to take Pepcid AC before breakfast and supper and stop the Zantac  Arrie Senate MD

## 2018-01-01 NOTE — Patient Instructions (Addendum)
Medicare Annual Wellness Visit  [facility] and the medical providers at [facility] strive to bring you the best medical care.  In doing so we not only want to address your current medical conditions and concerns but also to detect new conditions early and prevent illness, disease and health-related problems.    Medicare offers a yearly Wellness Visit which allows our clinical staff to assess your need for preventative services including immunizations, lifestyle education, counseling to decrease risk of preventable diseases and screening for fall risk and other medical concerns.    This visit is provided free of charge (no copay) for all Medicare recipients. The clinical pharmacists at [facility] have begun to conduct these Wellness Visits which will also include a thorough review of all your medications.    As you primary medical provider recommend that you make an appointment for your Annual Wellness Visit if you have not done so already this year.  You may set up this appointment before you leave today or you may call back ([phone]) and schedule an appointment.  Please make sure when you call that you mention that you are scheduling your Annual Wellness Visit with the clinical pharmacist so that the appointment may be made for the proper length of time.     Continue current medications. Continue good therapeutic lifestyle changes which include good diet and exercise. Fall precautions discussed with patient. If an FOBT was given today- please return it to our front desk. If you are over 51 years old - you may need Prevnar 31 or the adult Pneumonia vaccine.  **Flu shots are available--- please call and schedule a FLU-CLINIC appointment**  After your visit with [name] today you will receive a survey in the mail or online from [employer] regarding your care with [name]. Please take a moment to fill this out. Your feedback is very  important to [name] as you can help [name] better understand your patient needs as well as improve your experience and satisfaction. WE CARE ABOUT YOU!!!   When walking, please use a cane Maybe, you should restrict yourself and avoid leaf blowing. Follow-up with neurosurgery Moves slowly and do not do any climbing Discuss with neurosurgery the extreme weakness that you are having in your legs Remember to take Pepcid AC before breakfast and supper and stop the Zantac

## 2018-01-06 DIAGNOSIS — R030 Elevated blood-pressure reading, without diagnosis of hypertension: Secondary | ICD-10-CM | POA: Diagnosis not present

## 2018-01-06 DIAGNOSIS — R269 Unspecified abnormalities of gait and mobility: Secondary | ICD-10-CM | POA: Diagnosis not present

## 2018-02-03 ENCOUNTER — Other Ambulatory Visit: Payer: Self-pay | Admitting: Cardiology

## 2018-02-03 ENCOUNTER — Ambulatory Visit (INDEPENDENT_AMBULATORY_CARE_PROVIDER_SITE_OTHER): Payer: MEDICARE | Admitting: *Deleted

## 2018-02-03 DIAGNOSIS — E538 Deficiency of other specified B group vitamins: Secondary | ICD-10-CM | POA: Diagnosis not present

## 2018-02-03 MED ORDER — CYANOCOBALAMIN 1000 MCG/ML IJ SOLN
1000.0000 ug | INTRAMUSCULAR | Status: AC
  Administered 2018-02-03 – 2019-01-15 (×10): 1000 ug via INTRAMUSCULAR

## 2018-02-03 NOTE — Progress Notes (Signed)
Pt given Cyanocobalamin inj Tolerated well 

## 2018-03-06 ENCOUNTER — Ambulatory Visit (INDEPENDENT_AMBULATORY_CARE_PROVIDER_SITE_OTHER): Payer: MEDICARE | Admitting: *Deleted

## 2018-03-06 DIAGNOSIS — E538 Deficiency of other specified B group vitamins: Secondary | ICD-10-CM

## 2018-03-06 NOTE — Progress Notes (Signed)
Pt given Cyanocobalamin inj Tolerated well 

## 2018-03-08 ENCOUNTER — Other Ambulatory Visit: Payer: Self-pay | Admitting: Cardiology

## 2018-03-13 ENCOUNTER — Ambulatory Visit (INDEPENDENT_AMBULATORY_CARE_PROVIDER_SITE_OTHER): Payer: MEDICARE | Admitting: Family Medicine

## 2018-03-13 ENCOUNTER — Encounter: Payer: Self-pay | Admitting: Family Medicine

## 2018-03-13 VITALS — BP 122/64 | HR 62 | Temp 98.7°F | Ht 72.0 in | Wt 171.2 lb

## 2018-03-13 DIAGNOSIS — I2510 Atherosclerotic heart disease of native coronary artery without angina pectoris: Secondary | ICD-10-CM

## 2018-03-13 DIAGNOSIS — J40 Bronchitis, not specified as acute or chronic: Secondary | ICD-10-CM | POA: Diagnosis not present

## 2018-03-13 DIAGNOSIS — J329 Chronic sinusitis, unspecified: Secondary | ICD-10-CM

## 2018-03-13 DIAGNOSIS — K2100 Gastro-esophageal reflux disease with esophagitis, without bleeding: Secondary | ICD-10-CM

## 2018-03-13 MED ORDER — PANTOPRAZOLE SODIUM 40 MG PO TBEC: 40.0000 mg | DELAYED_RELEASE_TABLET | Freq: Every day | ORAL | 11 refills | Status: DC

## 2018-03-13 MED ORDER — AZITHROMYCIN 250 MG PO TABS: ORAL_TABLET | ORAL | 0 refills | Status: DC

## 2018-03-13 NOTE — Progress Notes (Signed)
Subjective:  Patient ID: Jon Reid, male    DOB: 04/09/1928  Age: 84 y.o. MRN: [id number]  CC: Heartburn (x 1 day)   HPI Jon Reid presents for severe RUQ pain yesterday for a few hours. Also felt in the throat. Denies precordial pain. Has hx of angina. Says this is not like that. Feels like someone lit a fire down there (in his RUQ.) No Nausea. No dyspnea. Pt. Recently switched from nexxium to pepcid for his Reflux. Pepcid hasn't been working as well. Also having hacky cough. Coughed up mucus last night.  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   He has a past surgical history that includes Retropubic prostatectomy; Lymph node dissection; Cardiac catheterization (11/07/2012); Coronary artery bypass graft (N/A, 11/11/2012); Intraoprative transesophageal echocardiogram (N/A, 11/11/2012); left heart catheterization with coronary angiogram (N/A, 11/07/2012); Inguinal hernia repair (Right, 01/10/2017); and Insertion of mesh (Right, 01/10/2017).   His family history includes Aneurysm in his father; Cancer in his mother; Hypertension in his father; Parkinson's disease in his son; Stroke in his father.He reports that he has never smoked. He has never used smokeless tobacco. He reports current alcohol use. He reports that he does not use drugs.    ROS Review of Systems  Constitutional: Negative for fever.  Respiratory: Positive for cough. Negative for shortness of breath.   Cardiovascular: Negative for chest pain.  Gastrointestinal: Positive for abdominal distention and abdominal pain. Negative for blood in stool,  constipation and nausea.  Musculoskeletal: Negative for arthralgias.  Skin: Negative for rash.    Objective:  BP 122/64   Pulse 62   Temp 98.7 F (37.1 C) (Oral)   Ht 6' (1.829 m)   Wt 171 lb 3.2 oz (77.7 kg)   BMI 23.22 kg/m   BP Readings from Last 3 Encounters:  03/13/18 122/64  01/01/18 (!) 150/68  12/04/17 110/60    Wt Readings from Last 3 Encounters:  03/13/18 171 lb 3.2 oz (77.7 kg)  01/01/18 171 lb (77.6 kg)  12/04/17 170 lb (77.1 kg)     Physical Exam Vitals signs reviewed.  Constitutional:      Appearance: He is well-developed.  HENT:     Head: Normocephalic and atraumatic.     Right Ear: Tympanic membrane and external ear normal. No decreased hearing noted.     Left Ear: Tympanic membrane and external ear normal. No decreased hearing noted.     Mouth/Throat:     Pharynx: No oropharyngeal exudate or posterior oropharyngeal erythema.  Eyes:     Pupils: Pupils are equal, round, and reactive to light.  Neck:     Musculoskeletal: Normal range of motion and neck supple.  Cardiovascular:     Rate and Rhythm: Normal rate and regular rhythm.     Heart sounds: No murmur.  Pulmonary:     Effort: No respiratory distress.     Breath sounds: Normal breath sounds.  Abdominal:     General: Bowel sounds are normal.     Palpations: Abdomen is soft. There is no mass.     Tenderness: There is abdominal tenderness (mild, at RUQ).  Assessment & Plan:   Jon Reid was seen today for heartburn.  Diagnoses and all orders for this visit:  Sinobronchitis  Gastroesophageal reflux disease with esophagitis  Other orders -     pantoprazole (PROTONIX) 40 MG tablet; Take 1 tablet (40 mg total) by mouth daily. For stomach -     azithromycin (ZITHROMAX Z-PAK) 250 MG tablet; Take two right away Then one a day for the next 4 days.       I am having Jon Reid start on pantoprazole and azithromycin. I am also having him maintain his fluticasone, nitroGLYCERIN,  aspirin EC, Vitamin D, triamcinolone cream, furosemide, and metoprolol succinate. We will continue to administer cyanocobalamin.  Allergies as of 03/13/2018      Reactions   Crestor [rosuvastatin]    GYNECOMASTIA AND SWELLING      Medication List       Accurate as of March 13, 2018  3:52 PM. Always use your most recent med list.        aspirin EC 81 MG tablet Take 81 mg by mouth daily.   azithromycin 250 MG tablet Commonly known as:  ZITHROMAX Z-PAK Take two right away Then one a day for the next 4 days.   fluticasone 50 MCG/ACT nasal spray Commonly known as:  FLONASE One to 2 sprays each nostril at bedtime   furosemide 40 MG tablet Commonly known as:  LASIX TAKE 1 TABLET DAILY   metoprolol succinate 25 MG 24 hr tablet Commonly known as:  TOPROL-XL TAKE 1 TABLET DAILY   nitroGLYCERIN 0.4 MG SL tablet Commonly known as:  NITROSTAT Place 1 tablet (0.4 mg total) under the tongue every 5 (five) minutes as needed for chest pain.   pantoprazole 40 MG tablet Commonly known as:  PROTONIX Take 1 tablet (40 mg total) by mouth daily. For stomach   triamcinolone cream 0.1 % Commonly known as:  KENALOG Apply 1 application topically 2 (two) times daily.   Vitamin D 50 MCG (2000 UT) Caps Take 2,000 Units by mouth daily.        Follow-up: No follow-ups on file.  Jon Reid, M.D.

## 2018-04-07 ENCOUNTER — Ambulatory Visit (INDEPENDENT_AMBULATORY_CARE_PROVIDER_SITE_OTHER): Payer: MEDICARE | Admitting: *Deleted

## 2018-04-07 DIAGNOSIS — E538 Deficiency of other specified B group vitamins: Secondary | ICD-10-CM | POA: Diagnosis not present

## 2018-04-07 NOTE — Progress Notes (Signed)
Pt given cyanocobalamin inj Tolerated well 

## 2018-05-09 ENCOUNTER — Ambulatory Visit (INDEPENDENT_AMBULATORY_CARE_PROVIDER_SITE_OTHER): Payer: MEDICARE | Admitting: Family Medicine

## 2018-05-09 ENCOUNTER — Encounter: Payer: Self-pay | Admitting: Family Medicine

## 2018-05-09 VITALS — BP 154/64 | HR 62 | Temp 97.5°F | Ht 72.0 in | Wt 178.0 lb

## 2018-05-09 DIAGNOSIS — N183 Chronic kidney disease, stage 3 unspecified: Secondary | ICD-10-CM

## 2018-05-09 DIAGNOSIS — E7800 Pure hypercholesterolemia, unspecified: Secondary | ICD-10-CM

## 2018-05-09 DIAGNOSIS — M4807 Spinal stenosis, lumbosacral region: Secondary | ICD-10-CM

## 2018-05-09 DIAGNOSIS — I700 Atherosclerosis of aorta: Secondary | ICD-10-CM

## 2018-05-09 DIAGNOSIS — K2100 Gastro-esophageal reflux disease with esophagitis, without bleeding: Secondary | ICD-10-CM

## 2018-05-09 DIAGNOSIS — E559 Vitamin D deficiency, unspecified: Secondary | ICD-10-CM

## 2018-05-09 DIAGNOSIS — I10 Essential (primary) hypertension: Secondary | ICD-10-CM | POA: Diagnosis not present

## 2018-05-09 DIAGNOSIS — C61 Malignant neoplasm of prostate: Secondary | ICD-10-CM

## 2018-05-09 DIAGNOSIS — I739 Peripheral vascular disease, unspecified: Secondary | ICD-10-CM | POA: Diagnosis not present

## 2018-05-09 DIAGNOSIS — D692 Other nonthrombocytopenic purpura: Secondary | ICD-10-CM | POA: Diagnosis not present

## 2018-05-09 DIAGNOSIS — R29898 Other symptoms and signs involving the musculoskeletal system: Secondary | ICD-10-CM

## 2018-05-09 DIAGNOSIS — E538 Deficiency of other specified B group vitamins: Secondary | ICD-10-CM

## 2018-05-09 NOTE — Progress Notes (Signed)
Subjective:    Patient ID: Jon Reid, male    DOB: 05-Apr-1928, 85 y.o.   MRN: [id number]  HPI Pt here for follow up and management of chronic medical problems which includes hypertension and hyperlipidemia. He is taking medication regularly.  The patient today is frustrated with his inability to do the things that he is always enjoyed doing and cannot do the mall because of weakness in his legs and even in his hands.  He has some periodic chest pain pressure and tightness and shortness of breath but no more than usual.  He does have heartburn that was relieved by getting on a proton pump inhibitor pantoprazole.  He was trying Pepcid AC and this did not work.  Otherwise his bowels are moving regularly he has not seen any blood in the stool and no change in bowel habits.  He is passing his water well but it is very slow.  We did discuss getting some physical therapy next-door that this would be helpful in strengthening his upper and lower extremities but he says he wants to think about that.  He will be given an FOBT to return.  He still sees the cardiologist regularly.   Patient Active Problem List   Diagnosis Date Noted  . Gastroesophageal reflux disease with esophagitis 03/13/2018  . Murmur, cardiac 12/04/2017  . Coronary artery disease involving native coronary artery of native heart without angina pectoris 12/04/2017  . PVC's (premature ventricular contractions) 12/04/2017  . Fatigue 11/14/2016  . Abnormality of gait 12/06/2015  . Abdominal aortic atherosclerosis ([place]) 12/01/2015  . Lumbar degenerative disc disease 12/01/2015  . Exertional angina ([place]) 07/20/2015  . Hernia of abdominal wall 10/12/2014  . Gynecomastia 04/09/2013  . Hx of CABG 02/02/2013  . Myelodysplastic syndrome 11/06/2012  . Vitamin B 12 deficiency 07/07/2012  . Hyperlipemia 07/07/2012  . Macrocytosis without anemia 01/08/2012  . PVD 04/14/2009  . CARCINOMA, PROSTATE 04/13/2009  . VASCULAR PURPURA 04/13/2009    . Benign essential HTN 04/13/2009  . Chronic renal insufficiency, stage III (moderate) ([place]) 04/13/2009  . SLEEP APNEA 04/13/2009  . ELECTROCARDIOGRAM, ABNORMAL 04/13/2009   Outpatient Encounter Medications as of 05/09/2018  Medication Sig  . aspirin EC 81 MG tablet Take 81 mg by mouth daily.  . Cholecalciferol (VITAMIN D) 2000 units CAPS Take 2,000 Units by mouth daily.  . fluticasone (FLONASE) 50 MCG/ACT nasal spray One to 2 sprays each nostril at bedtime (Patient taking differently: Place 1-2 sprays into both nostrils daily as needed for allergies or rhinitis (depends on congestion if does 1-2 sprays). )  . furosemide (LASIX) 40 MG tablet TAKE 1 TABLET DAILY  . metoprolol succinate (TOPROL-XL) 25 MG 24 hr tablet TAKE 1 TABLET DAILY  . pantoprazole (PROTONIX) 40 MG tablet Take 1 tablet (40 mg total) by mouth daily. For stomach  . triamcinolone cream (KENALOG) 0.1 % Apply 1 application topically 2 (two) times daily.  . nitroGLYCERIN (NITROSTAT) 0.4 MG SL tablet Place 1 tablet (0.4 mg total) under the tongue every 5 (five) minutes as needed for chest pain. (Patient not taking: Reported on 05/09/2018)  . [DISCONTINUED] azithromycin (ZITHROMAX Z-PAK) 250 MG tablet Take two right away Then one a day for the next 4 days.   Facility-Administered Encounter Medications as of 05/09/2018  Medication  . cyanocobalamin ((VITAMIN B-12)) injection 1,000 mcg     Review of Systems  Constitutional: Negative.   HENT: Negative.   Eyes: Negative.   Respiratory: Negative.   Cardiovascular: Negative.  Gastrointestinal: Negative.   Endocrine: Negative.   Genitourinary: Negative.   Musculoskeletal: Negative.   Skin: Negative.   Allergic/Immunologic: Negative.   Neurological: Positive for weakness (slow motion - age ).  Hematological: Negative.   Psychiatric/Behavioral: Negative.        Objective:   Physical Exam Vitals signs and nursing note reviewed.  Constitutional:      Appearance: Normal  appearance. He is well-developed and normal weight. He is not ill-appearing.     Comments: And somewhat emotional because of his aging issues and not being able to do what he used to be able to do.  HENT:     Head: Normocephalic and atraumatic.     Right Ear: Tympanic membrane, ear canal and external ear normal. There is no impacted cerumen.     Left Ear: Tympanic membrane, ear canal and external ear normal. There is no impacted cerumen.     Nose: Nose normal. No congestion.     Mouth/Throat:     Mouth: Mucous membranes are moist.     Pharynx: Oropharynx is clear. No oropharyngeal exudate.  Eyes:     General: No scleral icterus.       Right eye: No discharge.        Left eye: No discharge.     Extraocular Movements: Extraocular movements intact.     Conjunctiva/sclera: Conjunctivae normal.     Pupils: Pupils are equal, round, and reactive to light.  Neck:     Musculoskeletal: Normal range of motion and neck supple.     Thyroid: No thyromegaly.     Vascular: Carotid bruit present.     Trachea: No tracheal deviation.     Comments: Right carotid bruit and left supraclavicular bruit.  No thyromegaly or anterior cervical adenopathy Cardiovascular:     Rate and Rhythm: Normal rate and regular rhythm.     Heart sounds: Murmur present. No friction rub. No gallop.      Comments: The heart is 72/min with a regular rate and rhythm.  Decreased pedal pulses and no pedal edema.  There is a grade 2/6 systolic ejection murmur Pulmonary:     Effort: Pulmonary effort is normal. No respiratory distress.     Breath sounds: Normal breath sounds. No wheezing or rales.     Comments: Clear anteriorly and posteriorly.  No axillary adenopathy Chest:     Chest wall: No tenderness.  Abdominal:     General: Abdomen is flat. Bowel sounds are normal.     Palpations: Abdomen is soft. There is no mass.     Tenderness: There is no abdominal tenderness.     Comments: No masses tenderness organ enlargement or  bruits but good inguinal pulses  Musculoskeletal: Normal range of motion.        General: Deformity present. No tenderness.     Right lower leg: No edema.     Left lower leg: No edema.     Comments: Kyphotic  Lymphadenopathy:     Cervical: No cervical adenopathy.  Skin:    General: Skin is warm and dry.     Findings: No rash.  Neurological:     Mental Status: He is alert and oriented to person, place, and time.     Cranial Nerves: No cranial nerve deficit.     Motor: Weakness present.     Gait: Gait abnormal.     Deep Tendon Reflexes: Reflexes are normal and symmetric. Reflexes normal.  Psychiatric:        Mood  and Affect: Mood normal.        Behavior: Behavior normal.        Thought Content: Thought content normal.        Judgment: Judgment normal.     Comments: Mood affect and behavior are somewhat depressed because of his inability to do the things that he wants to do.    BP (!) 159/59 (BP Location: Left Arm)   Pulse 62   Temp (!) 97.5 F (36.4 C) (Oral)   Ht 6' (1.829 m)   Wt 178 lb (80.7 kg)   BMI 24.14 kg/m         Assessment & Plan:  1. Benign essential HTN -Blood pressure is elevated in the office today.  Home blood pressures run a lot better and there will be no change in treatment. - BMP8+EGFR - CBC with Differential/Platelet - Hepatic function panel  2. Pure hypercholesterolemia -Continue as aggressive therapeutic lifestyle changes as possible - CBC with Differential/Platelet - Lipid panel  3. Gastroesophageal reflux disease with esophagitis -Reflux is better since he is back on a PPI.  Pepcid AC did not work. - CBC with Differential/Platelet  4. Prostate cancer [facility]) -Follow-up with urology as planned  5. Vitamin D deficiency -Continue vitamin D replacement pending results of lab work - VITAMIN D 25 Hydroxy (Vit-D Deficiency, Fractures)  6. Vitamin B 12 deficiency -Continue with B12 injections  7. Spinal stenosis of lumbosacral  region -Encouraged to do rehab program next-door.  Patient will call back and let us know if he changes his mind about this.  8. Weakness of both lower extremities -Encouraged to do rehab program - BMP8+EGFR - CBC with Differential/Platelet  9. Senile purpura (HCC) -The purpura still persists especially on his arms.  10. Peripheral vascular insufficiency (H[facility]) -Diminished pulses in both feet and weakness in both legs secondary to peripheral vascular insufficiency and spinal stenosis  11. Abdominal aortic atherosclerosis ([place]) -Continue as aggressive therapeutic lifestyle changes as possible  12. Chronic renal insufficiency, stage III (moderate) ([facility]) -Avoid NSAIDs and take Tylenol if needed for pain  Patient Instructions                       Medicare Annual Wellness Visit  [facility] and the medical providers at [facility] strive to bring you the best medical care.  In doing so we not only want to address your current medical conditions and concerns but also to detect new conditions early and prevent illness, disease and health-related problems.    Medicare offers a yearly Wellness Visit which allows our clinical staff to assess your need for preventative services including immunizations, lifestyle education, counseling to decrease risk of preventable diseases and screening for fall risk and other medical concerns.    This visit is provided free of charge (no copay) for all Medicare recipients. The clinical pharmacists at [place] [facility] have begun to conduct these Wellness Visits which will also include a thorough review of all your medications.    As you primary medical provider recommend that you make an appointment for your Annual Wellness Visit if you have not done so already this year.  You may set up this appointment before you leave today or you may call back ([phone]) and schedule an appointment.  Please make sure when you call that  you mention that you are scheduling your Annual Wellness Visit with the clinical pharmacist so that the appointment may be made for the  proper length of time.    Continue current medications. Continue good therapeutic lifestyle changes which include good diet and exercise. Fall precautions discussed with patient. If an FOBT was given today- please return it to our front desk. If you are over 40 years old - you may need Prevnar 65 or the adult Pneumonia vaccine.  **Flu shots are available--- please call and schedule a FLU-CLINIC appointment**  After your visit with [name] today you will receive a survey in the mail or online from [employer] regarding your care with [name]. Please take a moment to fill this out. Your feedback is very important to [name] as you can help [name] better understand your patient needs as well as improve your experience and satisfaction. WE CARE ABOUT YOU!!!  Continue to drink plenty of water and stay well-hydrated Please reconsider going next-door for rehab treatment to help strengthen upper and lower extremities.  Please call my nurse back and she can arrange that Continue to be careful and do not put yourself at risk for falling Drink plenty of water this is very important. Continue with B12 injections  Arrie Senate MD

## 2018-05-09 NOTE — Patient Instructions (Addendum)
Medicare Annual Wellness Visit  [facility] and the medical providers at [facility] strive to bring you the best medical care.  In doing so we not only want to address your current medical conditions and concerns but also to detect new conditions early and prevent illness, disease and health-related problems.    Medicare offers a yearly Wellness Visit which allows our clinical staff to assess your need for preventative services including immunizations, lifestyle education, counseling to decrease risk of preventable diseases and screening for fall risk and other medical concerns.    This visit is provided free of charge (no copay) for all Medicare recipients. The clinical pharmacists at [address] have begun to conduct these Wellness Visits which will also include a thorough review of all your medications.    As you primary medical provider recommend that you make an appointment for your Annual Wellness Visit if you have not done so already this year.  You may set up this appointment before you leave today or you may call back ([phone]) and schedule an appointment.  Please make sure when you call that you mention that you are scheduling your Annual Wellness Visit with the clinical pharmacist so that the appointment may be made for the proper length of time.    Continue current medications. Continue good therapeutic lifestyle changes which include good diet and exercise. Fall precautions discussed with patient. If an FOBT was given today- please return it to our front desk. If you are over 44 years old - you may need Prevnar 28 or the adult Pneumonia vaccine.  **Flu shots are available--- please call and schedule a FLU-CLINIC appointment**  After your visit with [name] today you will receive a survey in the mail or online from [employer] regarding your care with [name]. Please take a moment to fill this out. Your feedback is very  important to [name] as you can help [name] better understand your patient needs as well as improve your experience and satisfaction. WE CARE ABOUT YOU!!!  Continue to drink plenty of water and stay well-hydrated Please reconsider going next-door for rehab treatment to help strengthen upper and lower extremities.  Please call my nurse back and she can arrange that Continue to be careful and do not put yourself at risk for falling Drink plenty of water this is very important. Continue with B12 injections

## 2018-05-10 LAB — CBC WITH DIFFERENTIAL/PLATELET
BASOS: 1 %
Basophils Absolute: 0.1 10*3/uL (ref 0.0–0.2)
EOS (ABSOLUTE): 0.1 10*3/uL (ref 0.0–0.4)
EOS: 1 %
HEMATOCRIT: 31.3 % — AB (ref 37.5–51.0)
HEMOGLOBIN: 10.3 g/dL — AB (ref 13.0–17.7)
IMMATURE GRANULOCYTES: 0 %
Immature Grans (Abs): 0 10*3/uL (ref 0.0–0.1)
LYMPHS ABS: 2 10*3/uL (ref 0.7–3.1)
Lymphs: 26 %
MCH: 31.2 pg (ref 26.6–33.0)
MCHC: 32.9 g/dL (ref 31.5–35.7)
MCV: 95 fL (ref 79–97)
MONOCYTES: 11 %
Monocytes Absolute: 0.8 10*3/uL (ref 0.1–0.9)
Neutrophils Absolute: 4.6 10*3/uL (ref 1.4–7.0)
Neutrophils: 61 %
Platelets: 274 10*3/uL (ref 150–450)
RBC: 3.3 x10E6/uL — AB (ref 4.14–5.80)
RDW: 12.9 % (ref 11.6–15.4)
WBC: 7.5 10*3/uL (ref 3.4–10.8)

## 2018-05-10 LAB — LIPID PANEL
CHOL/HDL RATIO: 2.4 ratio (ref 0.0–5.0)
Cholesterol, Total: 148 mg/dL (ref 100–199)
HDL: 62 mg/dL (ref >39)
LDL CALC: 76 mg/dL (ref 0–99)
TRIGLYCERIDES: 50 mg/dL (ref 0–149)
VLDL Cholesterol Cal: 10 mg/dL (ref 5–40)

## 2018-05-10 LAB — BMP8+EGFR
BUN / CREAT RATIO: 12 (ref 10–24)
BUN: 20 mg/dL (ref 8–27)
CALCIUM: 9.3 mg/dL (ref 8.6–10.2)
CO2: 23 mmol/L (ref 20–29)
Chloride: 103 mmol/L (ref 96–106)
Creatinine, Ser: 1.64 mg/dL — ABNORMAL HIGH (ref 0.76–1.27)
GFR calc Af Amer: 43 mL/min/{1.73_m2} — ABNORMAL LOW (ref >59)
GFR calc non Af Amer: 37 mL/min/{1.73_m2} — ABNORMAL LOW (ref >59)
GLUCOSE: 107 mg/dL — AB (ref 65–99)
Potassium: 4.4 mmol/L (ref 3.5–5.2)
Sodium: 139 mmol/L (ref 134–144)

## 2018-05-10 LAB — HEPATIC FUNCTION PANEL
ALT: 13 IU/L (ref 0–44)
AST: 21 IU/L (ref 0–40)
Albumin: 4.5 g/dL (ref 3.6–4.6)
Alkaline Phosphatase: 71 IU/L (ref 39–117)
Bilirubin Total: 0.6 mg/dL (ref 0.0–1.2)
Bilirubin, Direct: 0.18 mg/dL (ref 0.00–0.40)
Total Protein: 6.7 g/dL (ref 6.0–8.5)

## 2018-05-10 LAB — VITAMIN D 25 HYDROXY (VIT D DEFICIENCY, FRACTURES): Vit D, 25-Hydroxy: 38.8 ng/mL (ref 30.0–100.0)

## 2018-05-12 ENCOUNTER — Other Ambulatory Visit: Payer: Self-pay

## 2018-05-12 DIAGNOSIS — R710 Precipitous drop in hematocrit: Secondary | ICD-10-CM

## 2018-05-13 ENCOUNTER — Other Ambulatory Visit: Payer: MEDICARE

## 2018-05-13 DIAGNOSIS — Z1211 Encounter for screening for malignant neoplasm of colon: Secondary | ICD-10-CM

## 2018-05-14 LAB — FECAL OCCULT BLOOD, IMMUNOCHEMICAL: FECAL OCCULT BLD: NEGATIVE

## 2018-05-20 DIAGNOSIS — H02135 Senile ectropion of left lower eyelid: Secondary | ICD-10-CM | POA: Diagnosis not present

## 2018-05-20 DIAGNOSIS — H02423 Myogenic ptosis of bilateral eyelids: Secondary | ICD-10-CM | POA: Diagnosis not present

## 2018-05-20 DIAGNOSIS — H57813 Brow ptosis, bilateral: Secondary | ICD-10-CM | POA: Diagnosis not present

## 2018-05-20 DIAGNOSIS — H02834 Dermatochalasis of left upper eyelid: Secondary | ICD-10-CM | POA: Diagnosis not present

## 2018-05-20 DIAGNOSIS — H02831 Dermatochalasis of right upper eyelid: Secondary | ICD-10-CM | POA: Diagnosis not present

## 2018-05-26 DIAGNOSIS — H02142 Spastic ectropion of right lower eyelid: Secondary | ICD-10-CM | POA: Diagnosis not present

## 2018-05-26 DIAGNOSIS — H0279 Other degenerative disorders of eyelid and periocular area: Secondary | ICD-10-CM | POA: Diagnosis not present

## 2018-05-26 DIAGNOSIS — H57813 Brow ptosis, bilateral: Secondary | ICD-10-CM | POA: Diagnosis not present

## 2018-05-26 DIAGNOSIS — H02135 Senile ectropion of left lower eyelid: Secondary | ICD-10-CM | POA: Diagnosis not present

## 2018-05-26 DIAGNOSIS — H02115 Cicatricial ectropion of left lower eyelid: Secondary | ICD-10-CM | POA: Diagnosis not present

## 2018-05-26 DIAGNOSIS — H02112 Cicatricial ectropion of right lower eyelid: Secondary | ICD-10-CM | POA: Diagnosis not present

## 2018-05-26 DIAGNOSIS — H02831 Dermatochalasis of right upper eyelid: Secondary | ICD-10-CM | POA: Diagnosis not present

## 2018-05-26 DIAGNOSIS — H53483 Generalized contraction of visual field, bilateral: Secondary | ICD-10-CM | POA: Diagnosis not present

## 2018-05-26 DIAGNOSIS — H02834 Dermatochalasis of left upper eyelid: Secondary | ICD-10-CM | POA: Diagnosis not present

## 2018-05-26 DIAGNOSIS — H02413 Mechanical ptosis of bilateral eyelids: Secondary | ICD-10-CM | POA: Diagnosis not present

## 2018-05-26 DIAGNOSIS — H02132 Senile ectropion of right lower eyelid: Secondary | ICD-10-CM | POA: Diagnosis not present

## 2018-05-26 DIAGNOSIS — H02423 Myogenic ptosis of bilateral eyelids: Secondary | ICD-10-CM | POA: Diagnosis not present

## 2018-06-10 ENCOUNTER — Other Ambulatory Visit: Payer: Self-pay

## 2018-06-10 ENCOUNTER — Telehealth: Payer: Self-pay | Admitting: Family Medicine

## 2018-06-10 ENCOUNTER — Ambulatory Visit (INDEPENDENT_AMBULATORY_CARE_PROVIDER_SITE_OTHER): Payer: MEDICARE | Admitting: *Deleted

## 2018-06-10 DIAGNOSIS — E538 Deficiency of other specified B group vitamins: Secondary | ICD-10-CM | POA: Diagnosis not present

## 2018-06-10 NOTE — Telephone Encounter (Signed)
Spoke with patient's wife.  She is concerned about husband coming into office through waiting room.  Advised her if she will drive him to office- he can enter through the emergency entrance in the back.  Advised cars are not allowed to park in the emergency entrance so she will need to park and come back to pick patient up when injection is complete.  Patient's wife agreeable, she will bring him today at 2:30 pm.

## 2018-07-28 ENCOUNTER — Other Ambulatory Visit: Payer: Self-pay

## 2018-07-28 ENCOUNTER — Ambulatory Visit (INDEPENDENT_AMBULATORY_CARE_PROVIDER_SITE_OTHER): Payer: MEDICARE | Admitting: *Deleted

## 2018-07-28 DIAGNOSIS — E538 Deficiency of other specified B group vitamins: Secondary | ICD-10-CM | POA: Diagnosis not present

## 2018-07-28 NOTE — Progress Notes (Signed)
Pt given Cyanocobalamin inj Tolerated well 

## 2018-09-08 DIAGNOSIS — H02834 Dermatochalasis of left upper eyelid: Secondary | ICD-10-CM | POA: Diagnosis not present

## 2018-09-08 DIAGNOSIS — H02423 Myogenic ptosis of bilateral eyelids: Secondary | ICD-10-CM | POA: Diagnosis not present

## 2018-09-08 DIAGNOSIS — H02112 Cicatricial ectropion of right lower eyelid: Secondary | ICD-10-CM | POA: Diagnosis not present

## 2018-09-08 DIAGNOSIS — H02135 Senile ectropion of left lower eyelid: Secondary | ICD-10-CM | POA: Diagnosis not present

## 2018-09-08 DIAGNOSIS — H57813 Brow ptosis, bilateral: Secondary | ICD-10-CM | POA: Diagnosis not present

## 2018-09-08 DIAGNOSIS — H0279 Other degenerative disorders of eyelid and periocular area: Secondary | ICD-10-CM | POA: Diagnosis not present

## 2018-09-08 DIAGNOSIS — H02132 Senile ectropion of right lower eyelid: Secondary | ICD-10-CM | POA: Diagnosis not present

## 2018-09-08 DIAGNOSIS — H53483 Generalized contraction of visual field, bilateral: Secondary | ICD-10-CM | POA: Diagnosis not present

## 2018-09-08 DIAGNOSIS — H02413 Mechanical ptosis of bilateral eyelids: Secondary | ICD-10-CM | POA: Diagnosis not present

## 2018-09-08 DIAGNOSIS — H02142 Spastic ectropion of right lower eyelid: Secondary | ICD-10-CM | POA: Diagnosis not present

## 2018-09-08 DIAGNOSIS — H02831 Dermatochalasis of right upper eyelid: Secondary | ICD-10-CM | POA: Diagnosis not present

## 2018-09-08 DIAGNOSIS — H53453 Other localized visual field defect, bilateral: Secondary | ICD-10-CM | POA: Diagnosis not present

## 2018-09-08 DIAGNOSIS — H02115 Cicatricial ectropion of left lower eyelid: Secondary | ICD-10-CM | POA: Diagnosis not present

## 2018-09-15 ENCOUNTER — Telehealth: Payer: Self-pay | Admitting: Family Medicine

## 2018-09-15 NOTE — Telephone Encounter (Signed)
appt made

## 2018-09-15 NOTE — Telephone Encounter (Signed)
Patient states he would like to talk to [facility] directly about which provider to switch to .

## 2018-10-10 ENCOUNTER — Ambulatory Visit: Payer: MEDICARE | Admitting: Family Medicine

## 2018-10-13 ENCOUNTER — Other Ambulatory Visit: Payer: Self-pay

## 2018-10-13 ENCOUNTER — Ambulatory Visit (INDEPENDENT_AMBULATORY_CARE_PROVIDER_SITE_OTHER): Payer: MEDICARE | Admitting: *Deleted

## 2018-10-13 DIAGNOSIS — E538 Deficiency of other specified B group vitamins: Secondary | ICD-10-CM | POA: Diagnosis not present

## 2018-10-23 ENCOUNTER — Ambulatory Visit (INDEPENDENT_AMBULATORY_CARE_PROVIDER_SITE_OTHER): Payer: MEDICARE

## 2018-10-23 ENCOUNTER — Other Ambulatory Visit: Payer: Self-pay

## 2018-10-23 ENCOUNTER — Ambulatory Visit (INDEPENDENT_AMBULATORY_CARE_PROVIDER_SITE_OTHER): Payer: MEDICARE | Admitting: Family Medicine

## 2018-10-23 ENCOUNTER — Encounter: Payer: Self-pay | Admitting: Family Medicine

## 2018-10-23 VITALS — BP 151/76 | HR 62 | Temp 97.5°F | Ht 72.0 in | Wt 164.0 lb

## 2018-10-23 DIAGNOSIS — M25561 Pain in right knee: Secondary | ICD-10-CM | POA: Diagnosis not present

## 2018-10-23 DIAGNOSIS — N183 Chronic kidney disease, stage 3 unspecified: Secondary | ICD-10-CM

## 2018-10-23 DIAGNOSIS — E7800 Pure hypercholesterolemia, unspecified: Secondary | ICD-10-CM | POA: Diagnosis not present

## 2018-10-23 DIAGNOSIS — E559 Vitamin D deficiency, unspecified: Secondary | ICD-10-CM | POA: Diagnosis not present

## 2018-10-23 DIAGNOSIS — E538 Deficiency of other specified B group vitamins: Secondary | ICD-10-CM

## 2018-10-23 DIAGNOSIS — K210 Gastro-esophageal reflux disease with esophagitis: Secondary | ICD-10-CM | POA: Diagnosis not present

## 2018-10-23 DIAGNOSIS — I10 Essential (primary) hypertension: Secondary | ICD-10-CM

## 2018-10-23 DIAGNOSIS — K219 Gastro-esophageal reflux disease without esophagitis: Secondary | ICD-10-CM | POA: Diagnosis not present

## 2018-10-23 DIAGNOSIS — M1711 Unilateral primary osteoarthritis, right knee: Secondary | ICD-10-CM | POA: Diagnosis not present

## 2018-10-23 MED ORDER — DICLOFENAC SODIUM 1 % TD GEL: 2.0000 g | Freq: Four times a day (QID) | TRANSDERMAL | 0 refills | Status: DC

## 2018-10-23 NOTE — Progress Notes (Signed)
Subjective:  Patient ID: Jon Reid, male    DOB: 10/11/1928, 86 y.o.   MRN: [id number]  Patient Care Team: Baruch Gouty, FNP as PCP - General (Family Medicine) Festus Aloe, MD (Urology) Curt Bears, MD (Hematology and Oncology) Warden Fillers, MD as Consulting Physician (Ophthalmology) Juanita Craver, MD as Consulting Physician (Gastroenterology) Latanya Maudlin, MD as Consulting Physician (Orthopedic Surgery) Kathrynn Ducking, MD as Consulting Physician (Neurology)   Chief Complaint:  Medical Management of Chronic Issues, Hyperlipidemia, Hypertension, and right knee pain   HPI: Jon Reid is a 85 y.o. male presenting on 10/23/2018 for Medical Management of Chronic Issues, Hyperlipidemia, Hypertension, and right knee pain   1. Acute pain of right knee  Pt states he twisted his knee three weeks ago and heard a pop. States he has had knee pain since then. States the pain is worse with certain movements. He has taken an Aspirin with some relief ot the pain. He states the pain is aching in nature, 3/10. No swelling or erythema. No loss of function or numbness. No deformity or swelling.    2. Pure hypercholesterolemia  Does watch diet. Does not eat meat, lots of vegetables and fruits. Not on statin therapy at this time. Diet controlled.    3. Benign essential HTN  Complaint with meds - Yes Current Medications - metoprolol, lasix (only taking 3 times per week) Checking BP at home ranging 135/75-85 Exercising Regularly - No Watching Salt intake - No Pertinent ROS:  Headache - No Fatigue - Yes Visual Disturbances - No Chest pain - No Dyspnea - No Palpitations - No LE edema - No They report good compliance with medications and can restate their regimen by memory. No medication side effects.  Family, social, and smoking history reviewed.   BP Readings from Last 3 Encounters:  10/23/18 (!) 151/76  05/09/18 (!) 154/64  03/13/18 122/64   CMP Latest Ref  Rng & Units 05/09/2018 11/07/2017 08/29/2017  Glucose 65 - 99 mg/dL 107(H) 96 94  BUN 8 - 27 mg/dL 20 18 17   Creatinine 0.76 - 1.27 mg/dL 1.64(H) 1.44(H) 1.46(H)  Sodium 134 - 144 mmol/L 139 141 142  Potassium 3.5 - 5.2 mmol/L 4.4 4.4 4.7  Chloride 96 - 106 mmol/L 103 104 105  CO2 20 - 29 mmol/L 23 21 23   Calcium 8.6 - 10.2 mg/dL 9.3 9.1 9.7  Total Protein 6.0 - 8.5 g/dL 6.7 6.8 6.5  Total Bilirubin 0.0 - 1.2 mg/dL 0.6 0.6 0.8  Alkaline Phos 39 - 117 IU/L 71 62 61  AST 0 - 40 IU/L 21 21 21   ALT 0 - 44 IU/L 13 9 11       4. Gastroesophageal reflux disease without esophagitis  Compliant with medications - Yes Current medications - protonix Adverse side effects - No Cough - No Sore throat - No Voice change - No Hemoptysis - No Dysphagia or dyspepsia - No Water brash - No Red Flags (weight loss, hematochezia, melena, weight loss, early satiety, fevers, odynophagia, or persistent vomiting) - No   5. Vitamin D deficiency  Pt is taking oral repletion therapy. Denies bone pain and tenderness, muscle weakness, fracture, and difficulty walking. Lab Results  Component Value Date   VD25OH 38.8 05/09/2018   VD25OH 42.7 08/29/2017   VD25OH 44.4 04/30/2017   Lab Results  Component Value Date   CALCIUM 9.3 05/09/2018      6. Vitamin B 12 deficiency  Monthly B 12 repletion therapy. Doing  well. Does have increased fatigue.    7. Chronic renal insufficiency, stage III (moderate) (HCC)  Denies decreased urination, leg swelling, or confusion. Does have fatigue.      Relevant past medical, surgical, family, and social history reviewed and updated as indicated.  Allergies and medications reviewed and updated. Date reviewed: Chart in Epic.   Past Medical History:  Diagnosis Date  . Carcinoma of prostate [facility])    prostate  . CKD (chronic kidney disease) stage 2, GFR 60-89 ml/min   . Coronary artery disease    a. LHC (8/14):  3v CAD => s/p CABG (LIMA-LAD, SVG-OM1, SVG-PDA)  . GERD  (gastroesophageal reflux disease)   . Gout   . HTN (hypertension)   . Hx of echocardiogram    Echocardiogram 11/13/12: EF 55-60%, normal wall motion, biatrial enlargement, mild AI, mild TR, no pericardial effusion  . 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 Sleep apnea    Questionable    Past Surgical History:  Procedure Laterality Date  . CARDIAC CATHETERIZATION  11/07/2012   Dr Acie Fredrickson  . CORONARY ARTERY BYPASS GRAFT N/A 11/11/2012   Procedure: CORONARY ARTERY BYPASS GRAFTING times four using Right Greater Saphenous Vein Graft harvested endoscopically and Left Internal Mammary Artery.;  Surgeon: Ivin Poot, MD;  Location: [facility];  Service: Open Heart Surgery;  Laterality: N/A;  . INGUINAL HERNIA REPAIR Right 01/10/2017   Procedure: OPEN RIGHT HERNIA REPAIR INGUINAL;  Surgeon: Ileana Roup, MD;  Location: [facility];  Service: General;  Laterality: Right;  . INSERTION OF MESH Right 01/10/2017   Procedure: INSERTION OF MESH;  Surgeon: Ileana Roup, MD;  Location: [facility];  Service: General;  Laterality: Right;  . INTRAOPERATIVE TRANSESOPHAGEAL ECHOCARDIOGRAM N/A 11/11/2012   Procedure: INTRAOPERATIVE TRANSESOPHAGEAL ECHOCARDIOGRAM;  Surgeon: Ivin Poot, MD;  Location: [place];  Service: Open Heart Surgery;  Laterality: N/A;  . LEFT HEART CATHETERIZATION WITH CORONARY ANGIOGRAM N/A 11/07/2012   Procedure: LEFT HEART CATHETERIZATION WITH CORONARY ANGIOGRAM;  Surgeon: Thayer Headings, MD;  Location: [facility] CATH LAB;  Service: Cardiovascular;  Laterality: N/A;  . LYMPH NODE DISSECTION     Bilateral pelvic  . RETROPUBIC PROSTATECTOMY     Radical    Social History   Socioeconomic History  . Marital status: Married    Spouse name: Not on file  . Number of children: 3  . Years of education: Not on file  . Highest education level: Not on file  Occupational History  . Occupation: Retired  Scientific laboratory technician  . Financial resource strain: Not on file  . Food insecurity    Worry: Not on file     Inability: Not on file  . Transportation needs    Medical: Not on file    Non-medical: Not on file  Tobacco Use  . Smoking status: Never Smoker  . Smokeless tobacco: Never Used  Substance and Sexual Activity  . Alcohol use: Yes    Comment: Rare  . Drug use: No  . Sexual activity: Not Currently  Lifestyle  . Physical activity    Days per week: Not on file    Minutes per session: Not on file  . Stress: Not on file  Relationships  . Social Herbalist on phone: Not on file    Gets together: Not on file    Attends religious service: Not on file    Active member of club or organization: Not on file    Attends meetings of clubs or  organizations: Not on file    Relationship status: Not on file  . Intimate partner violence    Fear of current or ex partner: Not on file    Emotionally abused: Not on file    Physically abused: Not on file    Forced sexual activity: Not on file  Other Topics Concern  . Not on file  Social History Narrative   Married with 3 children   Denies caffeine use     Outpatient Encounter Medications as of 10/23/2018  Medication Sig  . aspirin EC 81 MG tablet Take 81 mg by mouth daily.  . Cholecalciferol (VITAMIN D) 2000 units CAPS Take 2,000 Units by mouth daily.  . fluticasone (FLONASE) 50 MCG/ACT nasal spray One to 2 sprays each nostril at bedtime (Patient taking differently: Place 1-2 sprays into both nostrils daily as needed for allergies or rhinitis (depends on congestion if does 1-2 sprays). )  . furosemide (LASIX) 40 MG tablet TAKE 1 TABLET DAILY  . metoprolol succinate (TOPROL-XL) 25 MG 24 hr tablet TAKE 1 TABLET DAILY  . pantoprazole (PROTONIX) 40 MG tablet Take 1 tablet (40 mg total) by mouth daily. For stomach  . triamcinolone cream (KENALOG) 0.1 % Apply 1 application topically 2 (two) times daily.  . diclofenac sodium (VOLTAREN) 1 % GEL Apply 2 g topically 4 (four) times daily.  . nitroGLYCERIN (NITROSTAT) 0.4 MG SL tablet Place 1 tablet  (0.4 mg total) under the tongue every 5 (five) minutes as needed for chest pain. (Patient not taking: Reported on 05/09/2018)   Facility-Administered Encounter Medications as of 10/23/2018  Medication  . cyanocobalamin ((VITAMIN B-12)) injection 1,000 mcg    Allergies  Allergen Reactions  . Crestor [Rosuvastatin]     GYNECOMASTIA AND SWELLING    Review of Systems  Constitutional: Positive for activity change, appetite change and fatigue. Negative for chills, diaphoresis, fever and unexpected weight change.  Eyes: Negative for photophobia and visual disturbance.  Respiratory: Negative for cough, chest tightness, shortness of breath and wheezing.   Cardiovascular: Negative for chest pain, palpitations and leg swelling.  Gastrointestinal: Negative for abdominal pain, anal bleeding, blood in stool, constipation, diarrhea, nausea, rectal pain and vomiting.  Endocrine: Negative for cold intolerance, heat intolerance, polydipsia, polyphagia and polyuria.  Genitourinary: Negative for decreased urine volume, difficulty urinating, flank pain, frequency and hematuria.  Musculoskeletal: Positive for arthralgias (right knee pain). Negative for back pain, gait problem, joint swelling, myalgias, neck pain and neck stiffness.  Neurological: Positive for weakness (generalized). Negative for dizziness, tremors, seizures, syncope, facial asymmetry, speech difficulty, light-headedness, numbness and headaches.  Hematological: Does not bruise/bleed easily.  Psychiatric/Behavioral: Negative for confusion.  All other systems reviewed and are negative.       Objective:  BP (!) 151/76   Pulse 62   Temp (!) 97.5 F (36.4 C)   Ht 6' (1.829 m)   Wt 164 lb (74.4 kg)   BMI 22.24 kg/m    Wt Readings from Last 3 Encounters:  10/23/18 164 lb (74.4 kg)  05/09/18 178 lb (80.7 kg)  03/13/18 171 lb 3.2 oz (77.7 kg)    Physical Exam Vitals signs and nursing note reviewed.  Constitutional:      General: He is  not in acute distress.    Appearance: Normal appearance. He is well-developed and well-groomed. He is not ill-appearing, toxic-appearing or diaphoretic.     Comments: Pt is upset due to aging and not being able to do what he used to do.  HENT:     Head: Normocephalic and atraumatic.     Jaw: There is normal jaw occlusion.     Right Ear: Hearing, tympanic membrane, ear canal and external ear normal.     Left Ear: Hearing, tympanic membrane, ear canal and external ear normal.     Nose: Nose normal.     Mouth/Throat:     Lips: Pink.     Mouth: Mucous membranes are moist.     Pharynx: Oropharynx is clear. Uvula midline.  Eyes:     General: Lids are normal.     Extraocular Movements: Extraocular movements intact.     Conjunctiva/sclera: Conjunctivae normal.     Pupils: Pupils are equal, round, and reactive to light.  Neck:     Musculoskeletal: Normal range of motion and neck supple.     Thyroid: No thyroid mass, thyromegaly or thyroid tenderness.     Vascular: Carotid bruit present. No JVD.     Trachea: Trachea and phonation normal.  Cardiovascular:     Rate and Rhythm: Normal rate and regular rhythm.     Chest Wall: PMI is not displaced.     Pulses: Normal pulses.     Heart sounds: Murmur present. Systolic murmur present with a grade of 2/6. No friction rub. No gallop.   Pulmonary:     Effort: Pulmonary effort is normal. No respiratory distress.     Breath sounds: Normal breath sounds. No wheezing.  Abdominal:     General: Bowel sounds are normal. There is no distension or abdominal bruit.     Palpations: Abdomen is soft. There is no hepatomegaly or splenomegaly.     Tenderness: There is no abdominal tenderness. There is no right CVA tenderness or left CVA tenderness.     Hernia: No hernia is present.  Musculoskeletal: Normal range of motion.        General: No swelling or deformity.     Right hip: Normal.     Right knee: He exhibits bony tenderness. He exhibits normal range of  motion, no swelling, no effusion, no ecchymosis, no deformity, no laceration, no erythema, normal alignment, no LCL laxity, normal patellar mobility, normal meniscus and no MCL laxity. Tenderness found. Patellar tendon tenderness noted. No medial joint line, no lateral joint line, no MCL and no LCL tenderness noted.     Left knee: Normal.     Right ankle: Normal.     Right lower leg: No edema.     Left lower leg: No edema.  Lymphadenopathy:     Cervical: No cervical adenopathy.  Skin:    General: Skin is warm and dry.     Capillary Refill: Capillary refill takes less than 2 seconds.     Coloration: Skin is not cyanotic, jaundiced or pale.     Findings: Abrasion (left shin, no drainage, swelling, or surrounding erythema) and bruising (scattered) present. No rash.  Neurological:     General: No focal deficit present.     Mental Status: He is alert and oriented to person, place, and time.     Cranial Nerves: Cranial nerves are intact.     Sensory: Sensation is intact.     Motor: Motor function is intact.     Coordination: Coordination is intact.     Gait: Gait abnormal (slow).     Deep Tendon Reflexes: Reflexes are normal and symmetric.  Psychiatric:        Attention and Perception: Attention and perception normal.  Mood and Affect: Mood and affect normal.        Speech: Speech normal.        Behavior: Behavior normal. Behavior is cooperative.        Thought Content: Thought content normal.        Cognition and Memory: Cognition and memory normal.        Judgment: Judgment normal.     Results for orders placed or performed in visit on 05/13/18  Fecal occult blood, imunochemical   Specimen: Stool   STOOL  Result Value Ref Range   Fecal Occult Bld Negative Negative      X-Ray: right knee: No acute findings. Arthritis changes. Preliminary x-ray reading by Monia Pouch, FNP-C, [facility].  Pertinent labs & imaging results that were available during my care of the patient were  reviewed by me and considered in my medical decision making.  Assessment & Plan:  Jon Reid was seen today for medical management of chronic issues, hyperlipidemia, hypertension and right knee pain.  Diagnoses and all orders for this visit:  Acute pain of right knee No acute xray findings. Arthritic changes. Will notify pt if radiology reading differs. RICE. voltaren gel as needed for pain. Follow up in 6 weeks for reevaluation.  -     DG Knee 1-2 Views Right; Future -     diclofenac sodium (VOLTAREN) 1 % GEL; Apply 2 g topically 4 (four) times daily.  Pure hypercholesterolemia Last lipid panel unremarkable. Diet and exercise encouraged.  -     CMP14+EGFR  Benign essential HTN BP elevated in office today. Pt only taking lasix three times per week, pt to increase to every other day. DASH diet. Keep a log of BP and report any persistent high or low readings. Labs pending.  -     CMP14+EGFR -     CBC with Differential/Platelet -     Thyroid Panel With TSH  Gastroesophageal reflux disease without esophagitis Well controlled. No red flags.  -     CBC with Differential/Platelet  Vitamin D deficiency On repletion therapy, last Vit D level was normal. Continue repletion therapy.   Vitamin B 12 deficiency On monthly repletion therapy. Will check B12 level today.  -     CBC with Differential/Platelet -     Vitamin B12  Chronic renal insufficiency, stage III (moderate) (HCC) No change in urinary output, swelling, or confusion. Does have general fatigue and weakness. Likely due to aging. Will check labs today.  -     CMP14+EGFR -     CBC with Differential/Platelet     Continue all other maintenance medications.  Follow up plan: Return in about 6 weeks (around 12/04/2018), or if symptoms worsen or fail to improve, for knee pain.  Continue healthy lifestyle choices, including diet (rich in fruits, vegetables, and lean proteins, and low in salt and simple carbohydrates) and exercise (at  least 30 minutes of moderate physical activity daily).  Educational handout given for knee pain  The above assessment and management plan was discussed with the patient. The patient verbalized understanding of and has agreed to the management plan. Patient is aware to call the clinic if symptoms persist or worsen. Patient is aware when to return to the clinic for a follow-up visit. Patient educated on when it is appropriate to go to the emergency department.   Monia Pouch, FNP-C [facility] [phone] 10/23/18

## 2018-10-23 NOTE — Patient Instructions (Signed)
Acute Knee Pain, Adult Many things can cause knee pain. Sometimes, knee pain is sudden (acute) and may be caused by damage, swelling, or irritation of the muscles and tissues that support your knee. The pain often goes away on its own with time and rest. If the pain does not go away, tests may be done to find out what is causing the pain. Follow these instructions at home: Pay attention to any changes in your symptoms. Take these actions to relieve your pain. If you have a knee sleeve or brace:   Wear the sleeve or brace as told by your doctor. Remove it only as told by your doctor.  Loosen the sleeve or brace if your toes: ? Tingle. ? Become numb. ? Turn cold and blue.  Keep the sleeve or brace clean.  If the sleeve or brace is not waterproof: ? Do not let it get wet. ? Cover it with a watertight covering when you take a bath or shower. Activity  Rest your knee.  Do not do things that cause pain.  Avoid activities where both feet leave the ground at the same time (high-impact activities). Examples are running, jumping rope, and doing jumping jacks.  Work with a physical therapist to make a safe exercise program, as told by your doctor. Managing pain, stiffness, and swelling   If told, put ice on the knee: ? Put ice in a plastic bag. ? Place a towel between your skin and the bag. ? Leave the ice on for 20 minutes, 2-3 times a day.  If told, put pressure (compression) on your injured knee to control swelling, give support, and help with discomfort. Compression may be done with an elastic bandage. General instructions  Take all medicines only as told by your doctor.  Raise (elevate) your knee while you are sitting or lying down. Make sure your knee is higher than your heart.  Sleep with a pillow under your knee.  Do not use any products that contain nicotine or tobacco. These include cigarettes, e-cigarettes, and chewing tobacco. These products may slow down healing. If  you need help quitting, ask your doctor.  If you are overweight, work with your doctor and a food expert (dietitian) to set goals to lose weight. Being overweight can make your knee hurt more.  Keep all follow-up visits as told by your doctor. This is important. Contact a doctor if:  The knee pain does not stop.  The knee pain changes or gets worse.  You have a fever along with knee pain.  Your knee feels warm when you touch it.  Your knee gives out or locks up. Get help right away if:  Your knee swells, and the swelling gets worse.  You cannot move your knee.  You have very bad knee pain. Summary  Many things can cause knee pain. The pain often goes away on its own with time and rest.  Your doctor may do tests to find out the cause of the pain.  Pay attention to any changes in your symptoms. Relieve your pain with rest, medicines, light activity, and use of ice.  Get help right away if you cannot move your knee or your knee pain is very bad. This information is not intended to replace advice given to you by your health care provider. Make sure you discuss any questions you have with your health care provider. Document Released: 06/01/2008 Document Revised: 08/15/2017 Document Reviewed: 08/15/2017 Elsevier Patient Education  2020 [employer].

## 2018-10-24 LAB — CMP14+EGFR
ALT: 10 IU/L (ref 0–44)
AST: 23 IU/L (ref 0–40)
Albumin/Globulin Ratio: 2 (ref 1.2–2.2)
Albumin: 4.5 g/dL (ref 3.6–4.6)
Alkaline Phosphatase: 62 IU/L (ref 39–117)
BUN/Creatinine Ratio: 15 (ref 10–24)
BUN: 22 mg/dL (ref 8–27)
Bilirubin Total: 0.7 mg/dL (ref 0.0–1.2)
CO2: 20 mmol/L (ref 20–29)
Calcium: 9.5 mg/dL (ref 8.6–10.2)
Chloride: 106 mmol/L (ref 96–106)
Creatinine, Ser: 1.51 mg/dL — ABNORMAL HIGH (ref 0.76–1.27)
GFR calc Af Amer: 47 mL/min/{1.73_m2} — ABNORMAL LOW (ref >59)
GFR calc non Af Amer: 41 mL/min/{1.73_m2} — ABNORMAL LOW (ref >59)
Globulin, Total: 2.3 g/dL (ref 1.5–4.5)
Glucose: 105 mg/dL — ABNORMAL HIGH (ref 65–99)
Potassium: 4.6 mmol/L (ref 3.5–5.2)
Sodium: 141 mmol/L (ref 134–144)
Total Protein: 6.8 g/dL (ref 6.0–8.5)

## 2018-10-24 LAB — CBC WITH DIFFERENTIAL/PLATELET
Basophils Absolute: 0 10*3/uL (ref 0.0–0.2)
Basos: 1 %
EOS (ABSOLUTE): 0.1 10*3/uL (ref 0.0–0.4)
Eos: 2 %
Hematocrit: 34.2 % — ABNORMAL LOW (ref 37.5–51.0)
Hemoglobin: 11.5 g/dL — ABNORMAL LOW (ref 13.0–17.7)
Immature Grans (Abs): 0 10*3/uL (ref 0.0–0.1)
Immature Granulocytes: 0 %
Lymphocytes Absolute: 2.3 10*3/uL (ref 0.7–3.1)
Lymphs: 36 %
MCH: 31.3 pg (ref 26.6–33.0)
MCHC: 33.6 g/dL (ref 31.5–35.7)
MCV: 93 fL (ref 79–97)
Monocytes Absolute: 0.6 10*3/uL (ref 0.1–0.9)
Monocytes: 9 %
Neutrophils Absolute: 3.2 10*3/uL (ref 1.4–7.0)
Neutrophils: 52 %
Platelets: 228 10*3/uL (ref 150–450)
RBC: 3.68 x10E6/uL — ABNORMAL LOW (ref 4.14–5.80)
RDW: 13.5 % (ref 11.6–15.4)
WBC: 6.2 10*3/uL (ref 3.4–10.8)

## 2018-10-24 LAB — THYROID PANEL WITH TSH
Free Thyroxine Index: 1.9 (ref 1.2–4.9)
T3 Uptake Ratio: 28 % (ref 24–39)
T4, Total: 6.9 ug/dL (ref 4.5–12.0)
TSH: 2.18 u[IU]/mL (ref 0.450–4.500)

## 2018-10-24 LAB — VITAMIN B12: Vitamin B-12: 839 pg/mL (ref 232–1245)

## 2018-11-07 ENCOUNTER — Encounter: Payer: Self-pay | Admitting: Family Medicine

## 2018-11-07 ENCOUNTER — Ambulatory Visit (INDEPENDENT_AMBULATORY_CARE_PROVIDER_SITE_OTHER): Payer: MEDICARE | Admitting: Family Medicine

## 2018-11-07 ENCOUNTER — Other Ambulatory Visit: Payer: Self-pay

## 2018-11-07 VITALS — BP 132/81 | HR 68 | Temp 97.5°F | Ht 72.0 in | Wt 159.0 lb

## 2018-11-07 DIAGNOSIS — G8929 Other chronic pain: Secondary | ICD-10-CM | POA: Diagnosis not present

## 2018-11-07 DIAGNOSIS — M25561 Pain in right knee: Secondary | ICD-10-CM | POA: Diagnosis not present

## 2018-11-07 DIAGNOSIS — M25562 Pain in left knee: Secondary | ICD-10-CM | POA: Insufficient documentation

## 2018-11-07 MED ORDER — METHYLPREDNISOLONE ACETATE 40 MG/ML IJ SUSP
40.0000 mg | Freq: Once | INTRAMUSCULAR | Status: AC
  Administered 2018-11-07: 40 mg via INTRA_ARTICULAR

## 2018-11-07 MED ORDER — METHYLPREDNISOLONE ACETATE 40 MG/ML IJ SUSP: 40.0000 mg | Freq: Once | INTRAMUSCULAR | Status: DC

## 2018-11-07 MED ORDER — DICLOFENAC SODIUM 1 % TD GEL: 2.0000 g | Freq: Four times a day (QID) | TRANSDERMAL | 3 refills | Status: DC

## 2018-11-07 NOTE — Progress Notes (Signed)
Subjective:    Jon Reid is a 85 y.o. male who presents for follow up on a knee problem involving the right knee. Onset was sudden, starting about 5 weeks ago. Inciting event: twisting injury while lifting something. Current symptoms include: crepitus sensation, pain located anterior and lateral knee and stiffness. Pain is aggravated by any weight bearing, going up and down stairs, rising after sitting, squatting, standing and walking. Patient has had prior knee problems. Evaluation to date: plain films: degenerative changes. Treatment to date: avoidance of offending activity and prescription NSAIDS which are somewhat effective.  The following portions of the patient's history were reviewed and updated as appropriate: allergies, current medications, past family history, past medical history, past social history, past surgical history and problem list.   Review of Systems Constitutional: negative Respiratory: negative Cardiovascular: negative Integument/breast: negative Musculoskeletal:positive for arthralgias and stiff joints Neurological: negative Behavioral/Psych: negative   Objective:    BP 132/81   Pulse 68   Temp (!) 97.5 F (36.4 C)   Ht 6' (1.829 m)   Wt 159 lb (72.1 kg)   BMI 21.56 kg/m  Physical Exam  Constitutional: He is oriented to person, place, and time and well-developed, well-nourished, and in no distress. No distress.  HENT:  Head: Normocephalic and atraumatic.  Eyes: Pupils are equal, round, and reactive to light.  Neck: Neck supple.  Cardiovascular: Normal rate, regular rhythm and intact distal pulses. Exam reveals no gallop and no friction rub.  Murmur heard.  Systolic murmur is present with a grade of 2/6. Pulmonary/Chest: Effort normal and breath sounds normal. No respiratory distress.  Musculoskeletal:        General: No deformity or edema.     Right hip: Normal.     Right knee: He exhibits decreased range of motion and bony tenderness. He  exhibits no swelling, no effusion, no ecchymosis, no deformity, no laceration, no erythema, normal alignment, no LCL laxity, normal patellar mobility, normal meniscus and no MCL laxity. Tenderness found. Lateral joint line and patellar tendon tenderness noted. No medial joint line, no MCL and no LCL tenderness noted.     Left knee: Normal. He exhibits normal range of motion, no swelling and no ecchymosis.     Right ankle: Normal.  Neurological: He is alert and oriented to person, place, and time. Gait (antalgic gait, slow) abnormal.  Skin: Skin is warm and dry. No rash noted. He is not diaphoretic. No erythema.  Psychiatric: Mood, memory, affect and judgment normal.     Joint Injection/Arthrocentesis  Date/Time: 11/07/2018 11:28 AM Performed by: Baruch Gouty, FNP Authorized by: Baruch Gouty, FNP  Indications: pain  Body area: knee Joint: right knee Local anesthesia used: yes  Anesthesia: Local anesthesia used: yes Local Anesthetic: lidocaine spray  Sedation: Patient sedated: no  Preparation: Patient was prepped and draped in the usual sterile fashion. Needle size: 20 G Ultrasound guidance: no Approach: anterior Aspirate amount: 0 mL Methylprednisolone amount: 60 mg Lidocaine 2% amount: 3 mL Patient tolerance: patient tolerated the procedure well with no immediate complications Comments: Risks and benefits discussed prior to procedure. Pts name, DOB, procedure, and allergies verified prior to procedure.     Assessment:   Jon Reid was seen today for knee pain.  Diagnoses and all orders for this visit:  Chronic pain of right knee Ongoing pain of right knee. No new injuries. Pt would like relief from the pain. Pt offered and agreed to joint injection today. Joint injection completed and  pt tolerated well. Can continue Voltaren gel for pain. If joint injection is not beneficial, will refer to ortho for evaluation. Pt aware to report any new, worsening, or persistent symptoms.   -     Joint Injection/Arthrocentesis -     methylPREDNISolone acetate (DEPO-MEDROL) injection 40 mg, 60 mg given    Plan:    Natural history and expected course discussed. Questions answered. Scientist, clinical (histocompatibility and immunogenetics) distributed. Rest, ice, compression, and elevation (RICE) therapy. Reduction in offending activity. NSAIDs per medication orders. Arthrocentesis. See procedure note. Follow up in 4 weeks.    The above assessment and management plan was discussed with the patient. The patient verbalized understanding of and has agreed to the management plan. Patient is aware to call the clinic if symptoms fail to improve or worsen. Patient is aware when to return to the clinic for a follow-up visit. Patient educated on when it is appropriate to go to the emergency department.   Monia Pouch, FNP-C [facility] [address] [place], [place] [address] [phone]

## 2018-11-07 NOTE — Patient Instructions (Addendum)
Acute Knee Pain, Adult Acute knee pain is sudden and may be caused by damage, swelling, or irritation of the muscles and tissues that support your knee. The injury may result from:  A fall.  An injury to your knee from twisting motions.  A hit to the knee.  Infection. Acute knee pain may go away on its own with time and rest. If it does not, your health care provider may order tests to find the cause of the pain. These may include:  Imaging tests, such as an X-ray, MRI, or ultrasound.  Joint aspiration. In this test, fluid is removed from the knee.  Arthroscopy. In this test, a lighted tube is inserted into the knee and an image is projected onto a TV screen.  Biopsy. In this test, a sample of tissue is removed from the body and studied under a microscope. Follow these instructions at home: Pay attention to any changes in your symptoms. Take these actions to relieve your pain. If you have a knee sleeve or brace:   Wear the sleeve or brace as told by your health care provider. Remove it only as told by your health care provider.  Loosen the sleeve or brace if your toes tingle, become numb, or turn cold and blue.  Keep the sleeve or brace clean.  If the sleeve or brace is not waterproof: ? Do not let it get wet. ? Cover it with a watertight covering when you take a bath or shower. Activity  Rest your knee.  Do not do things that cause pain or make pain worse.  Avoid high-impact activities or exercises, such as running, jumping rope, or doing jumping jacks.  Work with a physical therapist to make a safe exercise program, as recommended by your health care provider. Do exercises as told by your physical therapist. Managing pain, stiffness, and swelling   If directed, put ice on the knee: ? Put ice in a plastic bag. ? Place a towel between your skin and the bag. ? Leave the ice on for 20 minutes, 2-3 times a day.  If directed, use an elastic bandage to put pressure  (compression) on your injured knee. This may control swelling, give support, and help with discomfort. General instructions  Take over-the-counter and prescription medicines only as told by your health care provider.  Raise (elevate) your knee above the level of your heart when you are sitting or lying down.  Sleep with a pillow under your knee.  Do not use any products that contain nicotine or tobacco, such as cigarettes, e-cigarettes, and chewing tobacco. These can delay healing. If you need help quitting, ask your health care provider.  If you are overweight, work with your health care provider and a dietitian to set a weight-loss goal that is healthy and reasonable for you. Extra weight can put pressure on your knee.  Keep all follow-up visits as told by your health care provider. This is important. Contact a health care provider if:  Your knee pain continues, changes, or gets worse.  You have a fever along with knee pain.  Your knee feels warm to the touch.  Your knee buckles or locks up. Get help right away if:  Your knee swells, and the swelling becomes worse.  You cannot move your knee.  You have severe pain in your knee. Summary  Acute knee pain can be caused by a fall, an injury, an infection, or damage, swelling, or irritation of the tissues that support your knee.  Your health care provider may perform tests to find out the cause of the pain.  Pay attention to any changes in your symptoms. Relieve your pain with rest, medicines, light activity, and use of ice.  Get help if your pain continues or becomes worse, your knee swells, or you cannot move your knee. This information is not intended to replace advice given to you by your health care provider. Make sure you discuss any questions you have with your health care provider. Document Released: 12/31/2006 Document Revised: 08/15/2017 Document Reviewed: 08/15/2017 Elsevier Patient Education  2020 [place].   Knee Injection A knee injection is a procedure to get medicine into your knee joint to relieve the pain, swelling, and stiffness of arthritis. Your health care provider uses a needle to inject medicine, which may also help to lubricate and cushion your knee joint. You may need more than one injection. Tell a health care provider about:  Any allergies you have.  All medicines you are taking, including vitamins, herbs, eye drops, creams, and over-the-counter medicines.  Any problems you or family members have had with anesthetic medicines.  Any blood disorders you have.  Any surgeries you have had.  Any medical conditions you have.  Whether you are pregnant or may be pregnant. What are the risks? Generally, this is a safe procedure. However, problems may occur, including:  Infection.  Bleeding.  Symptoms that get worse.  Damage to the area around your knee.  Allergic reaction to any of the medicines.  Skin reactions from repeated injections. What happens before the procedure?  Ask your health care provider about changing or stopping your regular medicines. This is especially important if you are taking diabetes medicines or blood thinners.  Plan to have someone take you home from the hospital or clinic. What happens during the procedure?   You will sit or lie down in a position for your knee to be treated.  The skin over your kneecap will be cleaned with a germ-killing soap.  You will be given a medicine that numbs the area (local anesthetic). You may feel some stinging.  The medicine will be injected into your knee. The needle is carefully placed between your kneecap and your knee. The medicine is injected into the joint space.  The needle will be removed at the end of the procedure.  A bandage (dressing) may be placed over the injection site. The procedure may vary among health care providers and hospitals. What can I expect after the procedure?  Your blood  pressure, heart rate, breathing rate, and blood oxygen level will be monitored until you leave the hospital or clinic.  You may have to move your knee through its full range of motion. This helps to get all the medicine into your joint space.  You will be watched to make sure that you do not have a reaction to the injected medicine.  You may feel more pain, swelling, and warmth than you did before the injection. This reaction may last about 1-2 days. Follow these instructions at home: Medicines  Take over-the-counter and prescription medicines only as told by your doctor.  Do not drive or use heavy machinery while taking prescription pain medicine.  Do not take medicines such as aspirin and ibuprofen unless your health care provider tells you to take them. Injection site care  Follow instructions from your health care provider about: ? How to take care of your puncture site. ? When and how you should change your dressing. ?  When you should remove your dressing.  Check your injection area every day for signs of infection. Check for: ? More redness, swelling, or pain after 2 days. ? Fluid or blood. ? Pus or a bad smell. ? Warmth. Managing pain, stiffness, and swelling   If directed, put ice on the injection area: ? Put ice in a plastic bag. ? Place a towel between your skin and the bag. ? Leave the ice on for 20 minutes, 2-3 times per day.  Do not apply heat to your knee.  Raise (elevate) the injection area above the level of your heart while you are sitting or lying down. General instructions  If you were given a dressing, keep it dry until your health care provider says it can be removed. Ask your health care provider when you can start showering or taking a bath.  Avoid strenuous activities for as long as directed by your health care provider. Ask your health care provider when you can return to your normal activities.  Keep all follow-up visits as told by your health  care provider. This is important. You may need more injections. Contact a health care provider if you have:  A fever.  Warmth in your injection area.  Fluid, blood, or pus coming from your injection site.  Symptoms at your injection site that last longer than 2 days after your procedure. Get help right away if:  Your knee: ? Turns very red. ? Becomes very swollen. ? Is in severe pain. Summary  A knee injection is a procedure to get medicine into your knee joint to relieve the pain, swelling, and stiffness of arthritis.  A needle is carefully placed between your kneecap and your knee to inject medicine into the joint space.  Before the procedure, ask your health care provider about changing or stopping your regular medicines, especially if you are taking diabetes medicines or blood thinners.  Contact your health care provider if you have any problems or questions after your procedure. This information is not intended to replace advice given to you by your health care provider. Make sure you discuss any questions you have with your health care provider. Document Released: 05/27/2006 Document Revised: 03/25/2017 Document Reviewed: 03/25/2017 Elsevier Patient Education  2020 [employer].

## 2018-11-13 ENCOUNTER — Telehealth: Payer: Self-pay | Admitting: Family Medicine

## 2018-11-14 ENCOUNTER — Ambulatory Visit (INDEPENDENT_AMBULATORY_CARE_PROVIDER_SITE_OTHER): Payer: MEDICARE | Admitting: *Deleted

## 2018-11-14 ENCOUNTER — Other Ambulatory Visit: Payer: Self-pay

## 2018-11-14 DIAGNOSIS — E538 Deficiency of other specified B group vitamins: Secondary | ICD-10-CM

## 2018-12-10 ENCOUNTER — Other Ambulatory Visit: Payer: Self-pay

## 2018-12-11 ENCOUNTER — Encounter: Payer: Self-pay | Admitting: Family Medicine

## 2018-12-11 ENCOUNTER — Ambulatory Visit (INDEPENDENT_AMBULATORY_CARE_PROVIDER_SITE_OTHER): Payer: MEDICARE | Admitting: Family Medicine

## 2018-12-11 VITALS — BP 144/65 | HR 64 | Temp 97.4°F | Resp 20 | Ht 72.0 in | Wt 163.0 lb

## 2018-12-11 DIAGNOSIS — M2550 Pain in unspecified joint: Secondary | ICD-10-CM | POA: Diagnosis not present

## 2018-12-11 DIAGNOSIS — G8929 Other chronic pain: Secondary | ICD-10-CM | POA: Diagnosis not present

## 2018-12-11 MED ORDER — PREDNISONE 20 MG PO TABS: ORAL_TABLET | ORAL | 0 refills | Status: DC

## 2018-12-11 NOTE — Patient Instructions (Addendum)
Tylenol twice daily.   Arthritis Arthritis means joint pain. It can also mean joint disease. A joint is a place where bones come together. There are more than 100 types of arthritis. What are the causes? This condition may be caused by:  Wear and tear of a joint. This is the most common cause.  A lot of acid in the blood, which leads to pain in the joint (gout).  Pain and swelling (inflammation) in a joint.  Infection of a joint.  Injuries in the joint.  A reaction to medicines (allergy). In some cases, the cause may not be known. What are the signs or symptoms? Symptoms of this condition include:  Redness at a joint.  Swelling at a joint.  Stiffness at a joint.  Warmth coming from the joint.  A fever.  A feeling of being sick. How is this treated? This condition may be treated with:  Treating the cause, if it is known.  Rest.  Raising (elevating) the joint.  Putting cold or hot packs on the joint.  Medicines to treat symptoms and reduce pain and swelling.  Shots of medicines (cortisone) into the joint. You may also be told to make changes in your life, such as doing exercises and losing weight. Follow these instructions at home: Medicines  Take over-the-counter and prescription medicines only as told by your doctor.  Do not take aspirin for pain if your doctor says that you may have gout. Activity  Rest your joint if your doctor tells you to.  Avoid activities that make the pain worse.  Exercise your joint regularly as told by your doctor. Try doing exercises like: ? Swimming. ? Water aerobics. ? Biking. ? Walking. Managing pain, stiffness, and swelling      If told, put ice on the affected area. ? Put ice in a plastic bag. ? Place a towel between your skin and the bag. ? Leave the ice on for 20 minutes, 2-3 times per day.  If your joint is swollen, raise (elevate) it above the level of your heart if told by your doctor.  If your joint  feels stiff in the morning, try taking a warm shower.  If told, put heat on the affected area. Do this as often as told by your doctor. Use the heat source that your doctor recommends, such as a moist heat pack or a heating pad. If you have diabetes, do not apply heat without asking your doctor. To apply heat: ? Place a towel between your skin and the heat source. ? Leave the heat on for 20-30 minutes. ? Remove the heat if your skin turns bright red. This is very important if you are unable to feel pain, heat, or cold. You may have a greater risk of getting burned. General instructions  Do not use any products that contain nicotine or tobacco, such as cigarettes, e-cigarettes, and chewing tobacco. If you need help quitting, ask your doctor.  Keep all follow-up visits as told by your doctor. This is important. Contact a doctor if:  The pain gets worse.  You have a fever. Get help right away if:  You have very bad pain in your joint.  You have swelling in your joint.  Your joint is red.  Many joints become painful and swollen.  You have very bad back pain.  Your leg is very weak.  You cannot control your pee (urine) or poop (stool). Summary  Arthritis means joint pain. It can also mean joint disease.  A joint is a place where bones come together.  The most common cause of this condition is wear and tear of a joint.  Symptoms of this condition include redness, swelling, or stiffness of the joint.  This condition is treated with rest, raising the joint, medicines, and putting cold or hot packs on the joint.  Follow your doctor's instructions about medicines, activity, exercises, and other home care treatments. This information is not intended to replace advice given to you by your health care provider. Make sure you discuss any questions you have with your health care provider. Document Released: 05/30/2009 Document Revised: 02/10/2018 Document Reviewed: 02/10/2018 Elsevier  Patient Education  2020 [employer].

## 2018-12-11 NOTE — Progress Notes (Signed)
Subjective:  Patient ID: Jon Reid, male    DOB: 1928/08/02, 85 y.o.   MRN: [id number]  Patient Care Team: Baruch Gouty, FNP as PCP - General (Family Medicine) Festus Aloe, MD (Urology) Curt Bears, MD (Hematology and Oncology) Warden Fillers, MD as Consulting Physician (Ophthalmology) Juanita Craver, MD as Consulting Physician (Gastroenterology) Latanya Maudlin, MD as Consulting Physician (Orthopedic Surgery) Kathrynn Ducking, MD as Consulting Physician (Neurology)   Chief Complaint:  Knee Pain (4 week follow up )   HPI: Jon Reid is a 85 y.o. male presenting on 12/11/2018 for Knee Pain (4 week follow up )   Pt presents today for follow up of chronic knee pain. Pt has his right knee injected on 11/07/2018. Pt states his knee feels better but he is now having pain pain in both of his hips. States this pain is aching to shooting and radiates down his thighs. No known injury or loss of function. States he is very stiff when he rests for a while and then loosens up once he starts moving around. Pt denies fever, chills, loss of function, bowel or bladder incontinence, injury, or falls. He has been using Voltaren gel with some relief of pain.   Knee Pain  The incident occurred more than 1 week ago. There was no injury mechanism. The pain is present in the right knee. The quality of the pain is described as aching. The pain is at a severity of 2/10. The pain is mild. The pain has been intermittent since onset. Pertinent negatives include no inability to bear weight, loss of motion, loss of sensation, muscle weakness, numbness or tingling. He reports no foreign bodies present. The symptoms are aggravated by weight bearing and movement. He has tried rest and NSAIDs (joint injection) for the symptoms. The treatment provided moderate relief.    Relevant past medical, surgical, family, and social history reviewed and updated as indicated.  Allergies and medications  reviewed and updated. Date reviewed: Chart in Epic.   Past Medical History:  Diagnosis Date  . Carcinoma of prostate [name])    prostate  . CKD (chronic kidney disease) stage 2, GFR 60-89 ml/min   . Coronary artery disease    a. LHC (8/14):  3v CAD => s/p CABG (LIMA-LAD, SVG-OM1, SVG-PDA)  . GERD (gastroesophageal reflux disease)   . Gout   . HTN (hypertension)   . Hx of echocardiogram    Echocardiogram 11/13/12: EF 55-60%, normal wall motion, biatrial enlargement, mild AI, mild TR, no pericardial effusion  . 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 Sleep apnea    Questionable    Past Surgical History:  Procedure Laterality Date  . CARDIAC CATHETERIZATION  11/07/2012   Dr Acie Fredrickson  . CORONARY ARTERY BYPASS GRAFT N/A 11/11/2012   Procedure: CORONARY ARTERY BYPASS GRAFTING times four using Right Greater Saphenous Vein Graft harvested endoscopically and Left Internal Mammary Artery.;  Surgeon: Ivin Poot, MD;  Location: [place];  Service: Open Heart Surgery;  Laterality: N/A;  . INGUINAL HERNIA REPAIR Right 01/10/2017   Procedure: OPEN RIGHT HERNIA REPAIR INGUINAL;  Surgeon: Ileana Roup, MD;  Location: [facility];  Service: General;  Laterality: Right;  . INSERTION OF MESH Right 01/10/2017   Procedure: INSERTION OF MESH;  Surgeon: Ileana Roup, MD;  Location: [facility];  Service: General;  Laterality: Right;  . INTRAOPERATIVE TRANSESOPHAGEAL ECHOCARDIOGRAM N/A 11/11/2012   Procedure: INTRAOPERATIVE TRANSESOPHAGEAL ECHOCARDIOGRAM;  Surgeon: Ivin Poot, MD;  Location: [facility]  OR;  Service: Open Heart Surgery;  Laterality: N/A;  . LEFT HEART CATHETERIZATION WITH CORONARY ANGIOGRAM N/A 11/07/2012   Procedure: LEFT HEART CATHETERIZATION WITH CORONARY ANGIOGRAM;  Surgeon: Thayer Headings, MD;  Location: [facility] CATH LAB;  Service: Cardiovascular;  Laterality: N/A;  . LYMPH NODE DISSECTION     Bilateral pelvic  . RETROPUBIC PROSTATECTOMY     Radical    Social History   Socioeconomic History  .  Marital status: Married    Spouse name: Not on file  . Number of children: 3  . Years of education: Not on file  . Highest education level: Not on file  Occupational History  . Occupation: Retired  Scientific laboratory technician  . Financial resource strain: Not on file  . Food insecurity    Worry: Not on file    Inability: Not on file  . Transportation needs    Medical: Not on file    Non-medical: Not on file  Tobacco Use  . Smoking status: Never Smoker  . Smokeless tobacco: Never Used  Substance and Sexual Activity  . Alcohol use: Yes    Comment: Rare  . Drug use: No  . Sexual activity: Not Currently  Lifestyle  . Physical activity    Days per week: Not on file    Minutes per session: Not on file  . Stress: Not on file  Relationships  . Social Herbalist on phone: Not on file    Gets together: Not on file    Attends religious service: Not on file    Active member of club or organization: Not on file    Attends meetings of clubs or organizations: Not on file    Relationship status: Not on file  . Intimate partner violence    Fear of current or ex partner: Not on file    Emotionally abused: Not on file    Physically abused: Not on file    Forced sexual activity: Not on file  Other Topics Concern  . Not on file  Social History Narrative   Married with 3 children   Denies caffeine use     Outpatient Encounter Medications as of 12/11/2018  Medication Sig  . aspirin EC 81 MG tablet Take 81 mg by mouth daily.  . Cholecalciferol (VITAMIN D) 2000 units CAPS Take 2,000 Units by mouth daily.  . diclofenac sodium (VOLTAREN) 1 % GEL Apply 2 g topically 4 (four) times daily.  . fluticasone (FLONASE) 50 MCG/ACT nasal spray One to 2 sprays each nostril at bedtime (Patient taking differently: Place 1-2 sprays into both nostrils daily as needed for allergies or rhinitis (depends on congestion if does 1-2 sprays). )  . furosemide (LASIX) 40 MG tablet TAKE 1 TABLET DAILY  . metoprolol  succinate (TOPROL-XL) 25 MG 24 hr tablet TAKE 1 TABLET DAILY  . pantoprazole (PROTONIX) 40 MG tablet Take 1 tablet (40 mg total) by mouth daily. For stomach  . triamcinolone cream (KENALOG) 0.1 % Apply 1 application topically 2 (two) times daily.  . nitroGLYCERIN (NITROSTAT) 0.4 MG SL tablet Place 1 tablet (0.4 mg total) under the tongue every 5 (five) minutes as needed for chest pain. (Patient not taking: Reported on 12/11/2018)  . predniSONE (DELTASONE) 20 MG tablet 2 po at sametime daily for 5 days   Facility-Administered Encounter Medications as of 12/11/2018  Medication  . cyanocobalamin ((VITAMIN B-12)) injection 1,000 mcg    Allergies  Allergen Reactions  . Crestor [Rosuvastatin]  GYNECOMASTIA AND SWELLING    Review of Systems  Constitutional: Negative for activity change, appetite change, chills, diaphoresis, fatigue, fever and unexpected weight change.  HENT: Negative.   Eyes: Negative.  Negative for photophobia and visual disturbance.  Respiratory: Negative for cough, chest tightness and shortness of breath.   Cardiovascular: Negative for chest pain, palpitations and leg swelling.  Gastrointestinal: Negative for abdominal pain, blood in stool, constipation, diarrhea, nausea and vomiting.  Endocrine: Negative.   Genitourinary: Negative for decreased urine volume, difficulty urinating, dysuria, frequency and urgency.  Musculoskeletal: Positive for arthralgias, back pain, gait problem (due to stiffness and pain) and joint swelling. Negative for myalgias, neck pain and neck stiffness.  Skin: Negative.  Negative for color change and wound.  Allergic/Immunologic: Negative.   Neurological: Negative for dizziness, tingling, numbness and headaches.  Hematological: Negative.   Psychiatric/Behavioral: Negative for confusion, hallucinations, sleep disturbance and suicidal ideas.  All other systems reviewed and are negative.       Objective:  BP (!) 144/65   Pulse 64   Temp (!)  97.4 F (36.3 C)   Resp 20   Ht 6' (1.829 m)   Wt 163 lb (73.9 kg)   SpO2 99%   BMI 22.11 kg/m    Wt Readings from Last 3 Encounters:  12/11/18 163 lb (73.9 kg)  11/07/18 159 lb (72.1 kg)  10/23/18 164 lb (74.4 kg)    Physical Exam Vitals signs and nursing note reviewed.  Constitutional:      General: He is not in acute distress.    Appearance: Normal appearance. He is well-developed and well-groomed. He is not ill-appearing, toxic-appearing or diaphoretic.  HENT:     Head: Normocephalic and atraumatic.     Jaw: There is normal jaw occlusion.     Right Ear: Hearing normal.     Left Ear: Hearing normal.     Nose: Nose normal.     Mouth/Throat:     Lips: Pink.     Mouth: Mucous membranes are moist.     Pharynx: Oropharynx is clear. Uvula midline.  Eyes:     General: Lids are normal.     Extraocular Movements: Extraocular movements intact.     Conjunctiva/sclera: Conjunctivae normal.     Pupils: Pupils are equal, round, and reactive to light.  Neck:     Musculoskeletal: Normal range of motion and neck supple.     Thyroid: No thyroid mass, thyromegaly or thyroid tenderness.     Vascular: No carotid bruit or JVD.     Trachea: Trachea and phonation normal.  Cardiovascular:     Rate and Rhythm: Normal rate and regular rhythm.     Chest Wall: PMI is not displaced.     Pulses: Normal pulses.     Heart sounds: Murmur present. Systolic murmur present with a grade of 2/6. No friction rub. No gallop.   Pulmonary:     Effort: Pulmonary effort is normal. No respiratory distress.     Breath sounds: Normal breath sounds. No wheezing.  Abdominal:     General: Bowel sounds are normal. There is no distension or abdominal bruit.     Palpations: Abdomen is soft. There is no hepatomegaly or splenomegaly.     Tenderness: There is no abdominal tenderness. There is no right CVA tenderness or left CVA tenderness.     Hernia: No hernia is present.  Musculoskeletal:     Right hip: He  exhibits decreased range of motion and tenderness. He exhibits normal strength,  no bony tenderness, no swelling, no crepitus, no deformity and no laceration.     Left hip: He exhibits decreased range of motion and tenderness. He exhibits normal strength, no bony tenderness, no swelling, no crepitus, no deformity and no laceration.     Right knee: He exhibits decreased range of motion. He exhibits no swelling, no effusion, no ecchymosis, no deformity, no laceration, no erythema, normal alignment, no LCL laxity, normal patellar mobility, no bony tenderness, normal meniscus and no MCL laxity. Tenderness found.     Left knee: Normal.     Right ankle: Normal.     Lumbar back: He exhibits decreased range of motion. He exhibits no tenderness, no bony tenderness, no swelling, no edema, no deformity, no laceration, no pain, no spasm and normal pulse.       Back:     Right upper leg: Normal.     Left upper leg: Normal.     Right lower leg: No edema.     Left lower leg: No edema.  Lymphadenopathy:     Cervical: No cervical adenopathy.  Skin:    General: Skin is warm and dry.     Capillary Refill: Capillary refill takes less than 2 seconds.     Coloration: Skin is not cyanotic, jaundiced or pale.     Findings: No rash.  Neurological:     General: No focal deficit present.     Mental Status: He is alert and oriented to person, place, and time.     Cranial Nerves: Cranial nerves are intact.     Sensory: Sensation is intact.     Motor: Motor function is intact.     Coordination: Coordination is intact.     Gait: Gait is intact.     Deep Tendon Reflexes: Reflexes are normal and symmetric.  Psychiatric:        Attention and Perception: Attention and perception normal.        Mood and Affect: Mood and affect normal.        Speech: Speech normal.        Behavior: Behavior normal. Behavior is cooperative.        Thought Content: Thought content normal.        Cognition and Memory: Cognition and memory  normal.        Judgment: Judgment normal.     Results for orders placed or performed in visit on 10/23/18  CMP14+EGFR  Result Value Ref Range   Glucose 105 (H) 65 - 99 mg/dL   BUN 22 8 - 27 mg/dL   Creatinine, Ser 1.51 (H) 0.76 - 1.27 mg/dL   GFR calc non Af Amer 41 (L) >59 mL/min/1.73   GFR calc Af Amer 47 (L) >59 mL/min/1.73   BUN/Creatinine Ratio 15 10 - 24   Sodium 141 134 - 144 mmol/L   Potassium 4.6 3.5 - 5.2 mmol/L   Chloride 106 96 - 106 mmol/L   CO2 20 20 - 29 mmol/L   Calcium 9.5 8.6 - 10.2 mg/dL   Total Protein 6.8 6.0 - 8.5 g/dL   Albumin 4.5 3.6 - 4.6 g/dL   Globulin, Total 2.3 1.5 - 4.5 g/dL   Albumin/Globulin Ratio 2.0 1.2 - 2.2   Bilirubin Total 0.7 0.0 - 1.2 mg/dL   Alkaline Phosphatase 62 39 - 117 IU/L   AST 23 0 - 40 IU/L   ALT 10 0 - 44 IU/L  CBC with Differential/Platelet  Result Value Ref Range   WBC 6.2 3.4 -  10.8 x10E3/uL   RBC 3.68 (L) 4.14 - 5.80 x10E6/uL   Hemoglobin 11.5 (L) 13.0 - 17.7 g/dL   Hematocrit 34.2 (L) 37.5 - 51.0 %   MCV 93 79 - 97 fL   MCH 31.3 26.6 - 33.0 pg   MCHC 33.6 31.5 - 35.7 g/dL   RDW 13.5 11.6 - 15.4 %   Platelets 228 150 - 450 x10E3/uL   Neutrophils 52 Not Estab. %   Lymphs 36 Not Estab. %   Monocytes 9 Not Estab. %   Eos 2 Not Estab. %   Basos 1 Not Estab. %   Neutrophils Absolute 3.2 1.4 - 7.0 x10E3/uL   Lymphocytes Absolute 2.3 0.7 - 3.1 x10E3/uL   Monocytes Absolute 0.6 0.1 - 0.9 x10E3/uL   EOS (ABSOLUTE) 0.1 0.0 - 0.4 x10E3/uL   Basophils Absolute 0.0 0.0 - 0.2 x10E3/uL   Immature Granulocytes 0 Not Estab. %   Immature Grans (Abs) 0.0 0.0 - 0.1 x10E3/uL  Thyroid Panel With TSH  Result Value Ref Range   TSH 2.180 0.450 - 4.500 uIU/mL   T4, Total 6.9 4.5 - 12.0 ug/dL   T3 Uptake Ratio 28 24 - 39 %   Free Thyroxine Index 1.9 1.2 - 4.9  Vitamin B12  Result Value Ref Range   Vitamin B-12 839 232 - 1,245 pg/mL       Pertinent labs & imaging results that were available during my care of the patient were  reviewed by me and considered in my medical decision making.  Assessment & Plan:  Zyree was seen today for knee pain.  Diagnoses and all orders for this visit:  Chronic pain of multiple joints Chronic arthritic pain. Can not place on NSAIDs due to renal function. Will do burst of steroids. Pt can take tylenol twice daily as needed for pain. Symptomatic care discussed. Pt aware to report any new or worsening symptoms.  -     predniSONE (DELTASONE) 20 MG tablet; 2 po at sametime daily for 5 days     Continue all other maintenance medications.  Follow up plan: Return in about 4 weeks (around 01/08/2019), or if symptoms worsen or fail to improve, for arthritis.  Continue healthy lifestyle choices, including diet (rich in fruits, vegetables, and lean proteins, and low in salt and simple carbohydrates) and exercise (at least 30 minutes of moderate physical activity daily).  Educational handout given for arthritis   The above assessment and management plan was discussed with the patient. The patient verbalized understanding of and has agreed to the management plan. Patient is aware to call the clinic if they develop any new symptoms or if symptoms persist or worsen. Patient is aware when to return to the clinic for a follow-up visit. Patient educated on when it is appropriate to go to the emergency department.   Monia Pouch, FNP-C [facility] [phone]

## 2018-12-12 ENCOUNTER — Ambulatory Visit (INDEPENDENT_AMBULATORY_CARE_PROVIDER_SITE_OTHER): Payer: MEDICARE | Admitting: *Deleted

## 2018-12-12 ENCOUNTER — Other Ambulatory Visit: Payer: Self-pay

## 2018-12-12 DIAGNOSIS — Z0000 Encounter for general adult medical examination without abnormal findings: Secondary | ICD-10-CM

## 2018-12-12 NOTE — Patient Instructions (Signed)
Preventive Care 75 Years and Older, Male Preventive care refers to lifestyle choices and visits with your health care provider that can promote health and wellness. This includes:  A yearly physical exam. This is also called an annual well check.  Regular dental and eye exams.  Immunizations.  Screening for certain conditions.  Healthy lifestyle choices, such as diet and exercise. What can I expect for my preventive care visit? Physical exam Your health care provider will check:  Height and weight. These may be used to calculate body mass index (BMI), which is a measurement that tells if you are at a healthy weight.  Heart rate and blood pressure.  Your skin for abnormal spots. Counseling Your health care provider may ask you questions about:  Alcohol, tobacco, and drug use.  Emotional well-being.  Home and relationship well-being.  Sexual activity.  Eating habits.  History of falls.  Memory and ability to understand (cognition).  Work and work Statistician. What immunizations do I need?  Influenza (flu) vaccine  This is recommended every year. Tetanus, diphtheria, and pertussis (Tdap) vaccine  You may need a Td booster every 10 years. Varicella (chickenpox) vaccine  You may need this vaccine if you have not already been vaccinated. Zoster (shingles) vaccine  You may need this after age 50. Pneumococcal conjugate (PCV13) vaccine  One dose is recommended after age 24. Pneumococcal polysaccharide (PPSV23) vaccine  One dose is recommended after age 33. Measles, mumps, and rubella (MMR) vaccine  You may need at least one dose of MMR if you were born in 1957 or later. You may also need a second dose. Meningococcal conjugate (MenACWY) vaccine  You may need this if you have certain conditions. Hepatitis A vaccine  You may need this if you have certain conditions or if you travel or work in places where you may be exposed to hepatitis A. Hepatitis B vaccine   You may need this if you have certain conditions or if you travel or work in places where you may be exposed to hepatitis B. Haemophilus influenzae type b (Hib) vaccine  You may need this if you have certain conditions. You may receive vaccines as individual doses or as more than one vaccine together in one shot (combination vaccines). Talk with your health care provider about the risks and benefits of combination vaccines. What tests do I need? Blood tests  Lipid and cholesterol levels. These may be checked every 5 years, or more frequently depending on your overall health.  Hepatitis C test.  Hepatitis B test. Screening  Lung cancer screening. You may have this screening every year starting at age 74 if you have a 30-pack-year history of smoking and currently smoke or have quit within the past 15 years.  Colorectal cancer screening. All adults should have this screening starting at age 57 and continuing until age 54. Your health care provider may recommend screening at age 47 if you are at increased risk. You will have tests every 1-10 years, depending on your results and the type of screening test.  Prostate cancer screening. Recommendations will vary depending on your family history and other risks.  Diabetes screening. This is done by checking your blood sugar (glucose) after you have not eaten for a while (fasting). You may have this done every 1-3 years.  Abdominal aortic aneurysm (AAA) screening. You may need this if you are a current or former smoker.  Sexually transmitted disease (STD) testing. Follow these instructions at home: Eating and drinking  Eat  a diet that includes fresh fruits and vegetables, whole grains, lean protein, and low-fat dairy products. Limit your intake of foods with high amounts of sugar, saturated fats, and salt.  Take vitamin and mineral supplements as recommended by your health care provider.  Do not drink alcohol if your health care provider  tells you not to drink.  If you drink alcohol: ? Limit how much you have to 0-2 drinks a day. ? Be aware of how much alcohol is in your drink. In the [place] one drink equals one 12 oz bottle of beer (355 mL), one 5 oz glass of wine (148 mL), or one 1 oz glass of hard liquor (44 mL). Lifestyle  Take daily care of your teeth and gums.  Stay active. Exercise for at least 30 minutes on 5 or more days each week.  Do not use any products that contain nicotine or tobacco, such as cigarettes, e-cigarettes, and chewing tobacco. If you need help quitting, ask your health care provider.  If you are sexually active, practice safe sex. Use a condom or other form of protection to prevent STIs (sexually transmitted infections).  Talk with your health care provider about taking a low-dose aspirin or statin. What's next?  Visit your health care provider once a year for a well check visit.  Ask your health care provider how often you should have your eyes and teeth checked.  Stay up to date on all vaccines. This information is not intended to replace advice given to you by your health care provider. Make sure you discuss any questions you have with your health care provider. Document Released: 04/01/2015 Document Revised: 02/27/2018 Document Reviewed: 02/27/2018 Elsevier Patient Education  2020 [employer]  

## 2018-12-12 NOTE — Progress Notes (Signed)
MEDICARE ANNUAL WELLNESS VISIT  12/12/2018  Telephone Visit Disclaimer This Medicare AWV was conducted by telephone due to national recommendations for restrictions regarding the COVID-19 Pandemic (e.g. social distancing).  I verified, using two identifiers, that I am speaking with Jon Reid or their authorized healthcare agent. I discussed the limitations, risks, security, and privacy concerns of performing an evaluation and management service by telephone and the potential availability of an in-person appointment in the future. The patient expressed understanding and agreed to proceed.   Subjective:  Jon Reid is a 85 y.o. male patient of Jon Reid, Jon Reid, Jon Reid who had a Medicare Annual Wellness Visit today via telephone. Jon Reid is retired but still works at his [employer] here in [place] with his son and lives with their spouse. he has 3 children. he reports that he is socially active and does interact with friends/family regularly. he is moderately physically active and enjoys restoring antique cars and going to antique car shows to show his cars.  Patient Care Team: Baruch Gouty, FNP as PCP - General (Family Medicine) Festus Aloe, MD (Urology) Curt Bears, MD (Hematology and Oncology) Warden Fillers, MD as Consulting Physician (Ophthalmology) Juanita Craver, MD as Consulting Physician (Gastroenterology) Latanya Maudlin, MD as Consulting Physician (Orthopedic Surgery) Kathrynn Ducking, MD as Consulting Physician (Neurology)  Advanced Directives 12/12/2018 01/20/2017 01/02/2017 12/05/2016 07/16/2016 11/10/2012 11/07/2012  Does Patient Have a Medical Advance Directive? No No No No No Patient does not have advance directive Patient does not have advance directive  Would patient like information on creating a medical advance directive? No - Patient declined - No - Patient declined No - Patient declined Yes (MAU/Ambulatory/Procedural Areas - Information given) - -   Pre-existing out of facility DNR order (yellow form or pink MOST form) - - - - - No No    Hospital Utilization Over the Past 12 Months: # of hospitalizations or ER visits: 0 # of surgeries: 1  Review of Systems    Patient reports that his overall health is unchanged compared to last year.  History obtained from chart review  Patient Reported Readings (BP, Pulse, CBG, Weight, etc) none  Pain Assessment Pain : No/denies pain     Current Medications & Allergies (verified) Allergies as of 12/12/2018      Reactions   Crestor [rosuvastatin]    GYNECOMASTIA AND SWELLING      Medication List       Accurate as of December 12, 2018  8:37 AM. If you have any questions, ask your nurse or doctor.        aspirin EC 81 MG tablet Take 81 mg by mouth daily.   diclofenac sodium 1 % Gel Commonly known as: VOLTAREN Apply 2 g topically 4 (four) times daily.   fluticasone 50 MCG/ACT nasal spray Commonly known as: FLONASE One to 2 sprays each nostril at bedtime What changed:   how much to take  how to take this  when to take this  reasons to take this  additional instructions   furosemide 40 MG tablet Commonly known as: LASIX TAKE 1 TABLET DAILY   metoprolol succinate 25 MG 24 hr tablet Commonly known as: TOPROL-XL TAKE 1 TABLET DAILY   nitroGLYCERIN 0.4 MG SL tablet Commonly known as: NITROSTAT Place 1 tablet (0.4 mg total) under the tongue every 5 (five) minutes as needed for chest pain.   pantoprazole 40 MG tablet Commonly known as: PROTONIX Take 1 tablet (40 mg total)  by mouth daily. For stomach   predniSONE 20 MG tablet Commonly known as: Deltasone 2 po at sametime daily for 5 days   triamcinolone cream 0.1 % Commonly known as: KENALOG Apply 1 application topically 2 (two) times daily.   Vitamin D 50 MCG (2000 UT) Caps Take 2,000 Units by mouth daily.       History (reviewed): Past Medical History:  Diagnosis Date  . Carcinoma of prostate [facility])     prostate  . CKD (chronic kidney disease) stage 2, GFR 60-89 ml/min   . Coronary artery disease    a. LHC (8/14):  3v CAD => s/p CABG (LIMA-LAD, SVG-OM1, SVG-PDA)  . GERD (gastroesophageal reflux disease)   . Gout   . HTN (hypertension)   . Hx of echocardiogram    Echocardiogram 11/13/12: EF 55-60%, normal wall motion, biatrial enlargement, mild AI, mild TR, no pericardial effusion  . 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 Sleep apnea    Questionable   Past Surgical History:  Procedure Laterality Date  . CARDIAC CATHETERIZATION  11/07/2012   Dr Acie Fredrickson  . CORONARY ARTERY BYPASS GRAFT N/A 11/11/2012   Procedure: CORONARY ARTERY BYPASS GRAFTING times four using Right Greater Saphenous Vein Graft harvested endoscopically and Left Internal Mammary Artery.;  Surgeon: Ivin Poot, MD;  Location: [place];  Service: Open Heart Surgery;  Laterality: N/A;  . INGUINAL HERNIA REPAIR Right 01/10/2017   Procedure: OPEN RIGHT HERNIA REPAIR INGUINAL;  Surgeon: Ileana Roup, MD;  [facility];  Service: General;  Laterality: Right;  . INSERTION OF MESH Right 01/10/2017   Procedure: INSERTION OF MESH;  Surgeon: Ileana Roup, MD;  [facility];  Service: General;  Laterality: Right;  . INTRAOPERATIVE TRANSESOPHAGEAL ECHOCARDIOGRAM N/A 11/11/2012   Procedure: INTRAOPERATIVE TRANSESOPHAGEAL ECHOCARDIOGRAM;  Surgeon: Ivin Poot, MD;  Location: [place];  Service: Open Heart Surgery;  Laterality: N/A;  . LEFT HEART CATHETERIZATION WITH CORONARY ANGIOGRAM N/A 11/07/2012   Procedure: LEFT HEART CATHETERIZATION WITH CORONARY ANGIOGRAM;  Surgeon: Thayer Headings, MD;  Location: [facility] CATH LAB;  Service: Cardiovascular;  Laterality: N/A;  . LYMPH NODE DISSECTION     Bilateral pelvic  . RETROPUBIC PROSTATECTOMY     Radical   Family History  Problem Relation Age of Onset  . Aneurysm Father        Cerebral  . Stroke Father   . Hypertension Father   . Cancer Mother        colon  . Parkinson's  disease Son    Social History   Socioeconomic History  . Marital status: Married    Spouse name: [name]  . Number of children: 3  . Years of education: 77  . Highest education level: High school graduate  Occupational History  . Occupation: Retired  Scientific laboratory technician  . Financial resource strain: Not hard at all  . Food insecurity    Worry: Never true    Inability: Never true  . Transportation needs    Medical: No    Non-medical: No  Tobacco Use  . Smoking status: Never Smoker  . Smokeless tobacco: Never Used  Substance and Sexual Activity  . Alcohol use: Yes    Comment: Rare  . Drug use: No  . Sexual activity: Not Currently  Lifestyle  . Physical activity    Days per week: 7 days    Minutes per session: 30 min  . Stress: Not at all  Relationships  . Social connections    Talks  on phone: More than three times a week    Gets together: More than three times a week    Attends religious service: Never    Active member of club or organization: No    Attends meetings of clubs or organizations: Never    Relationship status: Married  Other Topics Concern  . Not on file  Social History Narrative   Married with 3 children   Denies caffeine use     Activities of Daily Living In your present state of health, do you have any difficulty performing the following activities: 12/12/2018  Hearing? N  Vision? N  Comment pt had his eyelid surgery to fix the droop eyelid 08/2018 and now sees fine  Difficulty concentrating or making decisions? Y  Comment sometimes will lay something down and then forget where he left it  Walking or climbing stairs? N  Dressing or bathing? N  Doing errands, shopping? N  Preparing Food and eating ? N  Using the Toilet? N  In the past six months, have you accidently leaked urine? N  Do you have problems with loss of bowel control? N  Managing your Medications? N  Managing your Finances? N  Housekeeping or managing your Housekeeping? N  Some recent  data might be hidden    Patient Education/ Literacy How often do you need to have someone help you when you read instructions, pamphlets, or other written materials from your doctor or pharmacy?: 1 - Never What is the last grade level you completed in school?: 12th grade  Exercise Current Exercise Habits: Home exercise routine, Type of exercise: walking, Time (Minutes): 30, Frequency (Times/Week): 7, Weekly Exercise (Minutes/Week): 210, Intensity: Mild, Exercise limited by: orthopedic condition(s)  Diet Patient reports consuming 2 meals a day and 2 snack(s) a day Patient reports that his primary diet is: Regular Patient reports that she does have regular access to food.   Depression Screen PHQ 2/9 Scores 12/12/2018 12/11/2018 11/07/2018 10/23/2018 05/09/2018 03/13/2018 01/01/2018  PHQ - 2 Score 0 0 0 0 0 0 0     Fall Risk Fall Risk  12/12/2018 12/11/2018 11/07/2018 10/23/2018 05/09/2018  Falls in the past year? 1 1 1 1 1   Number falls in past yr: 1 1 1  0 1  Injury with Fall? 0 0 1 1 1   Risk for fall due to : Impaired balance/gait - - - -  Follow up Falls prevention discussed - - - -  Comment Get rid of all throw rugs in the house, adequate lighting in the walkways and grab bars in the bathroom - - - -     Objective:  Jon Reid seemed alert and oriented and he participated appropriately during our telephone visit.  Blood Pressure Weight BMI  BP Readings from Last 3 Encounters:  12/11/18 (!) 144/65  11/07/18 132/81  10/23/18 (!) 151/76   Wt Readings from Last 3 Encounters:  12/11/18 163 lb (73.9 kg)  11/07/18 159 lb (72.1 kg)  10/23/18 164 lb (74.4 kg)   BMI Readings from Last 1 Encounters:  12/11/18 22.11 kg/m    *Unable to obtain current vital signs, weight, and BMI due to telephone visit type  Hearing/Vision  . Antwone did not seem to have difficulty with hearing/understanding during the telephone conversation . Reports that he has had a formal eye exam by an eye care  professional within the past year . Reports that he has not had a formal hearing evaluation within the past year *Unable to fully  assess hearing and vision during telephone visit type  Cognitive Function: 6CIT Screen 12/12/2018  What Year? 0 points  What month? 0 points  What time? 0 points  Count back from 20 0 points  Months in reverse 0 points  Repeat phrase 4 points  Total Score 4   (Normal:0-7, Significant for Dysfunction: >8)  Normal Cognitive Function Screening: Yes   Immunization & Health Maintenance Record Immunization History  Administered Date(s) Administered  . Influenza, High Dose Seasonal PF 01/02/2016, 12/21/2016, 01/01/2018  . Influenza,inj,Quad PF,6+ Mos 12/29/2012, 01/12/2014, 12/28/2014  . Pneumococcal Conjugate-13 04/09/2013  . Pneumococcal Polysaccharide-23 06/17/2009  . Tdap 07/16/2016    Health Maintenance  Topic Date Due  . INFLUENZA VACCINE  10/18/2018  . TETANUS/TDAP  07/17/2026  . PNA vac Low Risk Adult  Completed       Assessment  This is a routine wellness examination for [employer].  Health Maintenance: Due or Overdue Health Maintenance Due  Topic Date Due  . INFLUENZA VACCINE  10/18/2018    Jon Reid does not need a referral for Community Assistance: Care Management:   no Social Work:    no Prescription Assistance:  no Nutrition/Diabetes Education:  no   Plan:  Personalized Goals Goals Addressed            This Visit's Progress   . DIET - INCREASE WATER INTAKE       Try to drink 6-8 glasses of water daily.      Personalized Health Maintenance & Screening Recommendations  Influenza vaccine Shingles vaccine  Lung Cancer Screening Recommended: no (Low Dose CT Chest recommended if Age 71-80 years, 30 pack-year currently smoking OR have quit w/in past 15 years) Hepatitis C Screening recommended: no HIV Screening recommended: no  Advanced Directives: Written information was not prepared per patient's request.   Referrals & Orders No orders of the defined types were placed in this encounter.   Follow-up Plan . Follow-up with Baruch Gouty, FNP as planned . Consider Flu and Shingles vaccines at your next visit with your PCP   I have personally reviewed and noted the following in the patient's chart:   . Medical and social history . Use of alcohol, tobacco or illicit drugs  . Current medications and supplements . Functional ability and status . Nutritional status . Physical activity . Advanced directives . List of other physicians . Hospitalizations, surgeries, and ER visits in previous 12 months . Vitals . Screenings to include cognitive, depression, and falls . Referrals and appointments  In addition, I have reviewed and discussed with Jon Reid certain preventive protocols, quality metrics, and best practice recommendations. A written personalized care plan for preventive services as well as general preventive health recommendations is available and can be mailed to the patient at his request.      Milas Hock, LPN  [phone]

## 2018-12-16 ENCOUNTER — Other Ambulatory Visit: Payer: Self-pay

## 2018-12-16 ENCOUNTER — Ambulatory Visit (INDEPENDENT_AMBULATORY_CARE_PROVIDER_SITE_OTHER): Payer: MEDICARE

## 2018-12-16 DIAGNOSIS — Z23 Encounter for immunization: Secondary | ICD-10-CM

## 2018-12-16 DIAGNOSIS — E538 Deficiency of other specified B group vitamins: Secondary | ICD-10-CM

## 2018-12-16 NOTE — Progress Notes (Signed)
Cyanocobalamin injection given to right deltoid.  Patient tolerated well. 

## 2018-12-31 ENCOUNTER — Telehealth: Payer: Self-pay | Admitting: Family Medicine

## 2018-12-31 ENCOUNTER — Other Ambulatory Visit: Payer: Self-pay | Admitting: Family Medicine

## 2018-12-31 DIAGNOSIS — G8929 Other chronic pain: Secondary | ICD-10-CM

## 2018-12-31 DIAGNOSIS — M25561 Pain in right knee: Secondary | ICD-10-CM

## 2018-12-31 NOTE — Telephone Encounter (Signed)
Ok to refer, I will place order.

## 2018-12-31 NOTE — Telephone Encounter (Signed)
Patient wife aware

## 2019-01-07 DIAGNOSIS — M25561 Pain in right knee: Secondary | ICD-10-CM | POA: Diagnosis not present

## 2019-01-07 DIAGNOSIS — M545 Low back pain: Secondary | ICD-10-CM | POA: Diagnosis not present

## 2019-01-08 ENCOUNTER — Other Ambulatory Visit: Payer: Self-pay | Admitting: Orthopedic Surgery

## 2019-01-08 DIAGNOSIS — G8929 Other chronic pain: Secondary | ICD-10-CM

## 2019-01-08 DIAGNOSIS — M545 Low back pain, unspecified: Secondary | ICD-10-CM

## 2019-01-09 ENCOUNTER — Telehealth: Payer: Self-pay

## 2019-01-09 NOTE — Telephone Encounter (Signed)
Phone call to patient to verify medication list and allergies for myelogram procedure. Medications pt is currently taking are safe to continue to take. Advised pt if any new medications are started prior to procedure to call and make [name] aware. Pt also instructed to have a driver the day of the procedure, he would be with [name] about two hours, and discharge instructions. Pt verbalized understanding.

## 2019-01-13 ENCOUNTER — Ambulatory Visit: Payer: MEDICARE

## 2019-01-13 ENCOUNTER — Other Ambulatory Visit: Payer: Self-pay | Admitting: *Deleted

## 2019-01-13 MED ORDER — FUROSEMIDE 40 MG PO TABS: 40.0000 mg | ORAL_TABLET | Freq: Every day | ORAL | 0 refills | Status: DC

## 2019-01-15 ENCOUNTER — Ambulatory Visit (INDEPENDENT_AMBULATORY_CARE_PROVIDER_SITE_OTHER): Payer: MEDICARE

## 2019-01-15 DIAGNOSIS — E538 Deficiency of other specified B group vitamins: Secondary | ICD-10-CM

## 2019-01-15 NOTE — Progress Notes (Signed)
Cyanocobalamin injection given to left deltoid.  Patient tolerated well. 

## 2019-01-16 ENCOUNTER — Other Ambulatory Visit: Payer: Self-pay

## 2019-01-16 ENCOUNTER — Ambulatory Visit
Admission: RE | Admit: 2019-01-16 | Discharge: 2019-01-16 | Disposition: A | Discharge status: Home / Self Care | Payer: MEDICARE | Source: Ambulatory Visit | Attending: Orthopedic Surgery | Admitting: Orthopedic Surgery

## 2019-01-16 ENCOUNTER — Ambulatory Visit: Payer: MEDICARE | Admitting: Family Medicine

## 2019-01-16 DIAGNOSIS — M545 Low back pain, unspecified: Secondary | ICD-10-CM

## 2019-01-16 DIAGNOSIS — M5126 Other intervertebral disc displacement, lumbar region: Secondary | ICD-10-CM | POA: Diagnosis not present

## 2019-01-16 DIAGNOSIS — M5136 Other intervertebral disc degeneration, lumbar region: Secondary | ICD-10-CM | POA: Diagnosis not present

## 2019-01-16 DIAGNOSIS — G8929 Other chronic pain: Secondary | ICD-10-CM

## 2019-01-16 DIAGNOSIS — M48061 Spinal stenosis, lumbar region without neurogenic claudication: Secondary | ICD-10-CM | POA: Diagnosis not present

## 2019-01-16 MED ORDER — IOPAMIDOL (ISOVUE-M 200) INJECTION 41%
20.0000 mL | Freq: Once | INTRAMUSCULAR | Status: AC
  Administered 2019-01-16: 20 mL via INTRATHECAL

## 2019-01-16 NOTE — Progress Notes (Signed)
Pt refused Valium prior to procedure. Pt back in nursing area, doing well post procedure.

## 2019-01-16 NOTE — Discharge Instructions (Signed)

## 2019-01-23 ENCOUNTER — Telehealth: Payer: Self-pay | Admitting: Family Medicine

## 2019-01-23 ENCOUNTER — Other Ambulatory Visit: Payer: Self-pay

## 2019-01-23 NOTE — Telephone Encounter (Signed)
Patient aware he has to call their office.

## 2019-01-26 ENCOUNTER — Encounter: Payer: Self-pay | Admitting: Family Medicine

## 2019-01-26 ENCOUNTER — Other Ambulatory Visit: Payer: Self-pay

## 2019-01-26 ENCOUNTER — Ambulatory Visit (INDEPENDENT_AMBULATORY_CARE_PROVIDER_SITE_OTHER): Payer: MEDICARE | Admitting: Family Medicine

## 2019-01-26 VITALS — BP 155/75 | HR 63 | Temp 97.1°F | Resp 20 | Ht 72.0 in | Wt 159.0 lb

## 2019-01-26 DIAGNOSIS — G8929 Other chronic pain: Secondary | ICD-10-CM

## 2019-01-26 DIAGNOSIS — M2550 Pain in unspecified joint: Secondary | ICD-10-CM

## 2019-01-26 DIAGNOSIS — I350 Nonrheumatic aortic (valve) stenosis: Secondary | ICD-10-CM | POA: Insufficient documentation

## 2019-01-26 DIAGNOSIS — M7989 Other specified soft tissue disorders: Secondary | ICD-10-CM | POA: Insufficient documentation

## 2019-01-26 DIAGNOSIS — I208 Other forms of angina pectoris: Secondary | ICD-10-CM | POA: Diagnosis not present

## 2019-01-26 DIAGNOSIS — M25561 Pain in right knee: Secondary | ICD-10-CM | POA: Diagnosis not present

## 2019-01-26 NOTE — Progress Notes (Signed)
Cardiology Office Note   Date:  01/28/2019   ID:  [name], DOB 1928-05-18, MRN [id number]  PCP:  Baruch Gouty, FNP  Cardiologist:   Minus Breeding, MD  Referring:  Baruch Gouty, FNP  Chief Complaint  Patient presents with  . Extremity Weakness      History of Present Illness: Jon Reid is a 85 y.o. male who presents for follow up of CAD.  He has CABG in 2014.  In 2017 he had a negative stress perfusion study.  Since I last saw him he has had progressive leg weakness.  He has had several falls because of this.  He is being considered for back surgery as this might be the etiology.  He has had no cardiac complaints per se. The patient denies any new symptoms such as chest discomfort, neck or arm discomfort. There has been no new shortness of breath, PND or orthopnea. There have been no reported palpitations, presyncope or syncope.  Clearly his falls are mechanical and not a loss of consciousness.  He has had about a 20 pound weight loss but says he is eating okay.   Past Medical History:  Diagnosis Date  . Carcinoma of prostate [facility])    prostate  . CKD (chronic kidney disease) stage 2, GFR 60-89 ml/min   . Coronary artery disease    a. LHC (8/14):  3v CAD => s/p CABG (LIMA-LAD, SVG-OM1, SVG-PDA)  . GERD (gastroesophageal reflux disease)   . Gout   . HTN (hypertension)   . Hx of echocardiogram    Echocardiogram 11/13/12: EF 55-60%, normal wall motion, biatrial enlargement, mild AI, mild TR, no pericardial effusion  . 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 Sleep apnea    Questionable    Past Surgical History:  Procedure Laterality Date  . CARDIAC CATHETERIZATION  11/07/2012   Dr Acie Fredrickson  . CORONARY ARTERY BYPASS GRAFT N/A 11/11/2012   Procedure: CORONARY ARTERY BYPASS GRAFTING times four using Right Greater Saphenous Vein Graft harvested endoscopically and Left Internal Mammary Artery.;  Surgeon: Ivin Poot, MD;  Location: [place];  Service: Open Heart Surgery;   Laterality: N/A;  . INGUINAL HERNIA REPAIR Right 01/10/2017   Procedure: OPEN RIGHT HERNIA REPAIR INGUINAL;  Surgeon: Ileana Roup, MD;  [facility];  Service: General;  Laterality: Right;  . INSERTION OF MESH Right 01/10/2017   Procedure: INSERTION OF MESH;  Surgeon: Ileana Roup, MD;  [facility];  Service: General;  Laterality: Right;  . INTRAOPERATIVE TRANSESOPHAGEAL ECHOCARDIOGRAM N/A 11/11/2012   Procedure: INTRAOPERATIVE TRANSESOPHAGEAL ECHOCARDIOGRAM;  Surgeon: Ivin Poot, MD;  Location: [place] [facility];  Service: Open Heart Surgery;  Laterality: N/A;  . LEFT HEART CATHETERIZATION WITH CORONARY ANGIOGRAM N/A 11/07/2012   Procedure: LEFT HEART CATHETERIZATION WITH CORONARY ANGIOGRAM;  Surgeon: Thayer Headings, MD;  Location: [facility] CATH LAB;  Service: Cardiovascular;  Laterality: N/A;  . LYMPH NODE DISSECTION     Bilateral pelvic  . RETROPUBIC PROSTATECTOMY     Radical     Current Outpatient Medications  Medication Sig Dispense Refill  . aspirin EC 81 MG tablet Take 81 mg by mouth daily.    . Cholecalciferol (VITAMIN D) 2000 units CAPS Take 2,000 Units by mouth daily.    . diclofenac sodium (VOLTAREN) 1 % GEL Apply 2 g topically 4 (four) times daily. 150 g 3  . fluticasone (FLONASE) 50 MCG/ACT nasal spray One to 2 sprays each nostril at bedtime (Patient taking  differently: Place 1-2 sprays into both nostrils daily as needed for allergies or rhinitis (depends on congestion if does 1-2 sprays). ) 16 g 6  . furosemide (LASIX) 40 MG tablet Take 1 tablet (40 mg total) by mouth daily. 90 tablet 0  . nitroGLYCERIN (NITROSTAT) 0.4 MG SL tablet Place 1 tablet (0.4 mg total) under the tongue every 5 (five) minutes as needed for chest pain. 25 tablet PRN  . pantoprazole (PROTONIX) 40 MG tablet Take 1 tablet (40 mg total) by mouth daily. For stomach 30 tablet 11  . triamcinolone cream (KENALOG) 0.1 % Apply 1 application topically 2 (two) times daily. 30 g 3   Current  Facility-Administered Medications  Medication Dose Route Frequency Provider Last Rate Last Dose  . cyanocobalamin ((VITAMIN B-12)) injection 1,000 mcg  1,000 mcg Intramuscular Q30 days Chipper Herb, MD   1,000 mcg at 01/15/19 [id number]    Allergies:   Crestor [rosuvastatin]    ROS:  Please see the history of present illness.   Otherwise, review of systems are positive for none.   All other systems are reviewed and negative.    PHYSICAL EXAM: VS:  BP 100/70   Pulse 69   Ht 6' (1.829 m)   Wt 163 lb (73.9 kg)   BMI 22.11 kg/m  , BMI Body mass index is 22.11 kg/m. GENERAL: Slightly frail appearing NECK:  No jugular venous distention, waveform within normal limits, carotid upstroke brisk and symmetric, no bruits, no thyromegaly LUNGS:  Clear to auscultation bilaterally CHEST:  Unremarkable HEART:  PMI not displaced or sustained,S1 and S2 within normal limits, no S3, no S4, no clicks, no rubs, apical early peaking systolic murmur, no diastolic murmurs ABD:  Flat, positive bowel sounds normal in frequency in pitch, no bruits, no rebound, no guarding, no midline pulsatile mass, no hepatomegaly, no splenomegaly EXT:  2 plus pulses throughout, no edema, no cyanosis no clubbing NEURO: Diffuse lower extremity weakness.   EKG:  EKG is  ordered today. The ekg ordered today demonstrates NSR, LAD, new right bundle branch block, first-degree AV block rate 69.  Right bundle branch block is new since previous.   Recent Labs: 10/23/2018: ALT 10; BUN 22; Creatinine, Ser 1.51; Hemoglobin 11.5; Platelets 228; Potassium 4.6; Sodium 141; TSH 2.180    Lipid Panel    Component Value Date/Time   CHOL 148 05/09/2018 1036   CHOL 173 11/06/2012 1058   TRIG 50 05/09/2018 1036   TRIG 55 07/07/2015 0945   TRIG 73 11/06/2012 1058   HDL 62 05/09/2018 1036   HDL 54 07/07/2015 0945   HDL 66 11/06/2012 1058   CHOLHDL 2.4 05/09/2018 1036   CHOLHDL 2.5 11/08/2012 0500   VLDL 11 11/08/2012 0500   LDLCALC 76  05/09/2018 1036   LDLCALC 92 09/03/2013 0959   LDLCALC 92 11/06/2012 1058      Wt Readings from Last 3 Encounters:  01/28/19 163 lb (73.9 kg)  01/26/19 159 lb (72.1 kg)  12/11/18 163 lb (73.9 kg)      Other studies Reviewed: Additional studies/ records that were reviewed today include: Labs Review of the above records demonstrates:    Lab Results  Component Value Date   TSH 2.180 10/23/2018   Lab Results  Component Value Date   CREATININE 1.51 (H) 10/23/2018   ASSESSMENT AND PLAN:   Hx of CABG He has no symptoms since his stress test in 2017.  No change in therapy. work-up is planned.  Benign essential HTN The  blood pressure is at target and actually running slightly low.  I will adjust meds as below.   Chronic renal insufficiency, stage III (moderate) Creatinine was 1.5 recently which is stable.  No change in therapy.   AS This was moderate on echo in 2019.  I would not suspect that this has changed clinically.  No change in therapy.   MR  Mild on echo last year.  I will follow this clinically.     Hyperlipemia LDL was 76.  Continue current therapy.   Bifasicular block I discussed with him the need to let me know if he has any presyncope or syncope going forward.  He said he has conduction disease.  Having stopped his beta-blocker.  At this point there is no indication for pacing  Preop The patient has no high risk features for back surgery that is being considered.  This is not considered a high risk procedure.  He has a moderate functional level.  According to preop cardiovascular guidelines he would be at moderate risk for cardiovascular complications given his advanced age and moderate functional level.  He would be at some risk for bradycardia arrhythmias and should be monitored closely for this intra and postop.  However, there are no prohibitive contraindications.  He had a negative perfusion study a few years ago.  No further cardiovascular testing would be  suggested.  I had a long discussion with him about this.  Current medicines are reviewed at length with the patient today.  The patient does not have concerns regarding medicines.  The following changes have been made:  None  Labs/ tests ordered today include:   Orders Placed This Encounter  Procedures  . EKG 12-Lead     Disposition:   FU with me in 12 months   Signed, Minus Breeding, MD  01/28/2019 3:19 PM    [facility]

## 2019-01-26 NOTE — Progress Notes (Signed)
Subjective:  Patient ID: Jon Reid, male    DOB: 1928/07/06, 85 y.o.   MRN: [id number]  Patient Care Team: Baruch Gouty, FNP as PCP - General (Family Medicine) Festus Aloe, MD (Urology) Curt Bears, MD (Hematology and Oncology) Warden Fillers, MD as Consulting Physician (Ophthalmology) Juanita Craver, MD as Consulting Physician (Gastroenterology) Latanya Maudlin, MD as Consulting Physician (Orthopedic Surgery) Kathrynn Ducking, MD as Consulting Physician (Neurology)   Chief Complaint:  Knee Pain (follow up )   HPI: Jon Reid is a 85 y.o. male presenting on 01/26/2019 for Knee Pain (follow up )  1. Chronic pain of multiple joints 2. Chronic pain of right knee Ongoing pain of lower back and knees. Has follow up with ortho tomorrow to go over imaging results. He reports some days are better than others regarding the pain. States some mornings he can barely walk due to the pain and stiffness in his lower back and lags and other days he is able to get up and go. States he is no longer to walk long distances due to pain and fatigue. States he will get slightly short winded and have pain in legs and back after walking longer distances. States he will at times feel very fatigues and have slight discomfort in his chest. States he has not had chest pain in several weeks but does have exertional fatigue. He reports an upcoming appointment with cardiology on Wednesday. No syncope, PND, orthopnea, cough, sputum production, or lower extremity swelling. No weight gain.    Relevant past medical, surgical, family, and social history reviewed and updated as indicated.  Allergies and medications reviewed and updated. Date reviewed: Chart in Epic.   Past Medical History:  Diagnosis Date  . Carcinoma of prostate [facility])    prostate  . CKD (chronic kidney disease) stage 2, GFR 60-89 ml/min   . Coronary artery disease    a. LHC (8/14):  3v CAD => s/p CABG (LIMA-LAD, SVG-OM1,  SVG-PDA)  . GERD (gastroesophageal reflux disease)   . Gout   . HTN (hypertension)   . Hx of echocardiogram    Echocardiogram 11/13/12: EF 55-60%, normal wall motion, biatrial enlargement, mild AI, mild TR, no pericardial effusion  . 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 Sleep apnea    Questionable    Past Surgical History:  Procedure Laterality Date  . CARDIAC CATHETERIZATION  11/07/2012   Dr Acie Fredrickson  . CORONARY ARTERY BYPASS GRAFT N/A 11/11/2012   Procedure: CORONARY ARTERY BYPASS GRAFTING times four using Right Greater Saphenous Vein Graft harvested endoscopically and Left Internal Mammary Artery.;  Surgeon: Ivin Poot, MD;  Location: [facility];  Service: Open Heart Surgery;  Laterality: N/A;  . INGUINAL HERNIA REPAIR Right 01/10/2017   Procedure: OPEN RIGHT HERNIA REPAIR INGUINAL;  Surgeon: Ileana Roup, MD;  Location: [facility];  Service: General;  Laterality: Right;  . INSERTION OF MESH Right 01/10/2017   Procedure: INSERTION OF MESH;  Surgeon: Ileana Roup, MD;  Location: [facility];  Service: General;  Laterality: Right;  . INTRAOPERATIVE TRANSESOPHAGEAL ECHOCARDIOGRAM N/A 11/11/2012   Procedure: INTRAOPERATIVE TRANSESOPHAGEAL ECHOCARDIOGRAM;  Surgeon: Ivin Poot, MD;  Location: [facility];  Service: Open Heart Surgery;  Laterality: N/A;  . LEFT HEART CATHETERIZATION WITH CORONARY ANGIOGRAM N/A 11/07/2012   Procedure: LEFT HEART CATHETERIZATION WITH CORONARY ANGIOGRAM;  Surgeon: Thayer Headings, MD;  Location: [facility] CATH [facility];  Service: Cardiovascular;  Laterality: N/A;  . LYMPH NODE DISSECTION  Bilateral pelvic  . RETROPUBIC PROSTATECTOMY     Radical    Social History   Socioeconomic History  . Marital status: Married    Spouse name: [name]  . Number of children: 3  . Years of education: 110  . Highest education level: High school graduate  Occupational History  . Occupation: Retired  Scientific laboratory technician  . Financial resource strain: Not hard at all  . Food insecurity    Worry:  Never true    Inability: Never true  . Transportation needs    Medical: No    Non-medical: No  Tobacco Use  . Smoking status: Never Smoker  . Smokeless tobacco: Never Used  Substance and Sexual Activity  . Alcohol use: Yes    Comment: Rare  . Drug use: No  . Sexual activity: Not Currently  Lifestyle  . Physical activity    Days per week: 7 days    Minutes per session: 30 min  . Stress: Not at all  Relationships  . Social connections    Talks on phone: More than three times a week    Gets together: More than three times a week    Attends religious service: Never    Active member of club or organization: No    Attends meetings of clubs or organizations: Never    Relationship status: Married  . Intimate partner violence    Fear of current or ex partner: No    Emotionally abused: No    Physically abused: No    Forced sexual activity: No  Other Topics Concern  . Not on file  Social History Narrative   Married with 3 children   Denies caffeine use     Outpatient Encounter Medications as of 01/26/2019  Medication Sig  . aspirin EC 81 MG tablet Take 81 mg by mouth daily.  . Cholecalciferol (VITAMIN D) 2000 units CAPS Take 2,000 Units by mouth daily.  . diclofenac sodium (VOLTAREN) 1 % GEL Apply 2 g topically 4 (four) times daily.  . fluticasone (FLONASE) 50 MCG/ACT nasal spray One to 2 sprays each nostril at bedtime (Patient taking differently: Place 1-2 sprays into both nostrils daily as needed for allergies or rhinitis (depends on congestion if does 1-2 sprays). )  . furosemide (LASIX) 40 MG tablet Take 1 tablet (40 mg total) by mouth daily.  . metoprolol succinate (TOPROL-XL) 25 MG 24 hr tablet TAKE 1 TABLET DAILY  . pantoprazole (PROTONIX) 40 MG tablet Take 1 tablet (40 mg total) by mouth daily. For stomach  . predniSONE (DELTASONE) 20 MG tablet 2 po at sametime daily for 5 days  . triamcinolone cream (KENALOG) 0.1 % Apply 1 application topically 2 (two) times daily.  .  nitroGLYCERIN (NITROSTAT) 0.4 MG SL tablet Place 1 tablet (0.4 mg total) under the tongue every 5 (five) minutes as needed for chest pain. (Patient not taking: Reported on 01/26/2019)   Facility-Administered Encounter Medications as of 01/26/2019  Medication  . cyanocobalamin ((VITAMIN B-12)) injection 1,000 mcg    Allergies  Allergen Reactions  . Crestor [Rosuvastatin]     GYNECOMASTIA AND SWELLING    Review of Systems  Constitutional: Positive for fatigue (exertional). Negative for activity change, appetite change, chills, diaphoresis, fever and unexpected weight change.  HENT: Negative.   Eyes: Negative.   Respiratory: Positive for shortness of breath (exertional). Negative for cough, chest tightness, wheezing and stridor.   Cardiovascular: Negative for chest pain, palpitations and leg swelling.  Gastrointestinal: Negative for abdominal pain, blood  in stool, constipation, diarrhea, nausea and vomiting.  Endocrine: Negative.   Genitourinary: Negative for decreased urine volume, difficulty urinating, dysuria, frequency and urgency.  Musculoskeletal: Positive for arthralgias, back pain, gait problem (antalgic, slow) and myalgias. Negative for joint swelling, neck pain and neck stiffness.  Skin: Negative.   Allergic/Immunologic: Negative.   Neurological: Negative for dizziness, tremors, seizures, syncope, facial asymmetry, speech difficulty, weakness, light-headedness, numbness and headaches.  Hematological: Negative.   Psychiatric/Behavioral: Negative for confusion, hallucinations, sleep disturbance and suicidal ideas.  All other systems reviewed and are negative.       Objective:  BP (!) 155/75   Pulse 63   Temp (!) 97.1 F (36.2 C)   Resp 20   Ht 6' (1.829 m)   Wt 159 lb (72.1 kg)   SpO2 100%   BMI 21.56 kg/m    Wt Readings from Last 3 Encounters:  01/26/19 159 lb (72.1 kg)  12/11/18 163 lb (73.9 kg)  11/07/18 159 lb (72.1 kg)    Physical Exam Vitals signs and  nursing note reviewed.  Constitutional:      General: He is not in acute distress.    Appearance: Normal appearance. He is well-developed and well-groomed. He is not ill-appearing, toxic-appearing or diaphoretic.  HENT:     Head: Normocephalic and atraumatic.     Jaw: There is normal jaw occlusion.     Right Ear: Hearing normal.     Left Ear: Hearing normal.     Nose: Nose normal.     Mouth/Throat:     Lips: Pink.     Mouth: Mucous membranes are moist.     Pharynx: Oropharynx is clear. Uvula midline.  Eyes:     General: Lids are normal.     Extraocular Movements: Extraocular movements intact.     Conjunctiva/sclera: Conjunctivae normal.     Pupils: Pupils are equal, round, and reactive to light.  Neck:     Musculoskeletal: Normal range of motion and neck supple.     Thyroid: No thyroid mass, thyromegaly or thyroid tenderness.     Vascular: No carotid bruit or JVD.     Trachea: Trachea and phonation normal.  Cardiovascular:     Rate and Rhythm: Normal rate and regular rhythm.     Chest Wall: PMI is not displaced.     Pulses: Normal pulses.     Heart sounds: Murmur present. Systolic murmur present with a grade of 2/6. No friction rub. No gallop.   Pulmonary:     Effort: Pulmonary effort is normal. No respiratory distress.     Breath sounds: Normal breath sounds. No wheezing.  Abdominal:     General: Bowel sounds are normal. There is no distension or abdominal bruit.     Palpations: Abdomen is soft. There is no hepatomegaly or splenomegaly.     Tenderness: There is no abdominal tenderness. There is no right CVA tenderness or left CVA tenderness.     Hernia: No hernia is present.  Musculoskeletal:     Right hip: Normal.     Left hip: Normal.     Right knee: He exhibits normal range of motion, no swelling, no effusion, no ecchymosis, no deformity, no laceration, no erythema, normal alignment, no LCL laxity, normal patellar mobility, no bony tenderness, normal meniscus and no MCL  laxity. Tenderness found.     Right ankle: Normal.     Thoracic back: Normal.     Lumbar back: He exhibits decreased range of motion (pain with flexion, extension,  and lateral rotation), tenderness (mild) and pain. He exhibits no bony tenderness, no swelling, no edema, no deformity, no laceration, no spasm and normal pulse.     Right lower leg: No edema.     Left lower leg: No edema.  Lymphadenopathy:     Cervical: No cervical adenopathy.  Skin:    General: Skin is warm and dry.     Capillary Refill: Capillary refill takes less than 2 seconds.     Coloration: Skin is not cyanotic, jaundiced or pale.     Findings: No rash.  Neurological:     General: No focal deficit present.     Mental Status: He is alert and oriented to person, place, and time.     Cranial Nerves: Cranial nerves are intact.     Sensory: Sensation is intact.     Motor: Motor function is intact.     Coordination: Coordination is intact.     Gait: Gait abnormal (slow, antalgic).     Deep Tendon Reflexes: Reflexes are normal and symmetric.  Psychiatric:        Attention and Perception: Attention and perception normal.        Mood and Affect: Mood and affect normal.        Speech: Speech normal.        Behavior: Behavior normal. Behavior is cooperative.        Thought Content: Thought content normal.        Cognition and Memory: Cognition and memory normal.        Judgment: Judgment normal.     Results for orders placed or performed in visit on 10/23/18  CMP14+EGFR  Result Value Ref Range   Glucose 105 (H) 65 - 99 mg/dL   BUN 22 8 - 27 mg/dL   Creatinine, Ser 1.51 (H) 0.76 - 1.27 mg/dL   GFR calc non Af Amer 41 (L) >59 mL/min/1.73   GFR calc Af Amer 47 (L) >59 mL/min/1.73   BUN/Creatinine Ratio 15 10 - 24   Sodium 141 134 - 144 mmol/L   Potassium 4.6 3.5 - 5.2 mmol/L   Chloride 106 96 - 106 mmol/L   CO2 20 20 - 29 mmol/L   Calcium 9.5 8.6 - 10.2 mg/dL   Total Protein 6.8 6.0 - 8.5 g/dL   Albumin 4.5 3.6 -  4.6 g/dL   Globulin, Total 2.3 1.5 - 4.5 g/dL   Albumin/Globulin Ratio 2.0 1.2 - 2.2   Bilirubin Total 0.7 0.0 - 1.2 mg/dL   Alkaline Phosphatase 62 39 - 117 IU/L   AST 23 0 - 40 IU/L   ALT 10 0 - 44 IU/L  CBC with Differential/Platelet  Result Value Ref Range   WBC 6.2 3.4 - 10.8 x10E3/uL   RBC 3.68 (L) 4.14 - 5.80 x10E6/uL   Hemoglobin 11.5 (L) 13.0 - 17.7 g/dL   Hematocrit 34.2 (L) 37.5 - 51.0 %   MCV 93 79 - 97 fL   MCH 31.3 26.6 - 33.0 pg   MCHC 33.6 31.5 - 35.7 g/dL   RDW 13.5 11.6 - 15.4 %   Platelets 228 150 - 450 x10E3/uL   Neutrophils 52 Not Estab. %   Lymphs 36 Not Estab. %   Monocytes 9 Not Estab. %   Eos 2 Not Estab. %   Basos 1 Not Estab. %   Neutrophils Absolute 3.2 1.4 - 7.0 x10E3/uL   Lymphocytes Absolute 2.3 0.7 - 3.1 x10E3/uL   Monocytes Absolute 0.6 0.1 - 0.9 x10E3/uL  EOS (ABSOLUTE) 0.1 0.0 - 0.4 x10E3/uL   Basophils Absolute 0.0 0.0 - 0.2 x10E3/uL   Immature Granulocytes 0 Not Estab. %   Immature Grans (Abs) 0.0 0.0 - 0.1 x10E3/uL  Thyroid Panel With TSH  Result Value Ref Range   TSH 2.180 0.450 - 4.500 uIU/mL   T4, Total 6.9 4.5 - 12.0 ug/dL   T3 Uptake Ratio 28 24 - 39 %   Free Thyroxine Index 1.9 1.2 - 4.9  Vitamin B12  Result Value Ref Range   Vitamin B-12 839 232 - 1,245 pg/mL       Pertinent labs & imaging results that were available during my care of the patient were reviewed by me and considered in my medical decision making.  Assessment & Plan:  Jon Reid was seen today for knee pain.  Diagnoses and all orders for this visit:  Chronic pain of multiple joints Chronic pain of right knee Keep follow up with ortho tomorrow as scheduled and with cardiology on Wednesday as scheduled.  Some reported symptoms concerning for intermittent claudication. Discussed ABI testing in office. Pt would like to see what ortho and cardiology suggest.    Continue all other maintenance medications.  Follow up plan: Return if symptoms worsen or fail to  improve.  Continue healthy lifestyle choices, including diet (rich in fruits, vegetables, and lean proteins, and low in salt and simple carbohydrates) and exercise (at least 30 minutes of moderate physical activity daily).  Educational handout given for back pain  The above assessment and management plan was discussed with the patient. The patient verbalized understanding of and has agreed to the management plan. Patient is aware to call the clinic if they develop any new symptoms or if symptoms persist or worsen. Patient is aware when to return to the clinic for a follow-up visit. Patient educated on when it is appropriate to go to the emergency department.   Monia Pouch, FNP-C [facility] [phone]

## 2019-01-26 NOTE — Patient Instructions (Signed)
Acute Back Pain, Adult Acute back pain is sudden and usually short-lived. It is often caused by an injury to the muscles and tissues in the back. The injury may result from:  A muscle or ligament getting overstretched or torn (strained). Ligaments are tissues that connect bones to each other. Lifting something improperly can cause a back strain.  Wear and tear (degeneration) of the spinal disks. Spinal disks are circular tissue that provides cushioning between the bones of the spine (vertebrae).  Twisting motions, such as while playing sports or doing yard work.  A hit to the back.  Arthritis. You may have a physical exam, lab tests, and imaging tests to find the cause of your pain. Acute back pain usually goes away with rest and home care. Follow these instructions at home: Managing pain, stiffness, and swelling  Take over-the-counter and prescription medicines only as told by your health care provider.  Your health care provider may recommend applying ice during the first 24-48 hours after your pain starts. To do this: ? Put ice in a plastic bag. ? Place a towel between your skin and the bag. ? Leave the ice on for 20 minutes, 2-3 times a day.  If directed, apply heat to the affected area as often as told by your health care provider. Use the heat source that your health care provider recommends, such as a moist heat pack or a heating pad. ? Place a towel between your skin and the heat source. ? Leave the heat on for 20-30 minutes. ? Remove the heat if your skin turns bright red. This is especially important if you are unable to feel pain, heat, or cold. You have a greater risk of getting burned. Activity   Do not stay in bed. Staying in bed for more than 1-2 days can delay your recovery.  Sit up and stand up straight. Avoid leaning forward when you sit, or hunching over when you stand. ? If you work at a desk, sit close to it so you do not need to lean over. Keep your chin tucked  in. Keep your neck drawn back, and keep your elbows bent at a right angle. Your arms should look like the letter "L." ? Sit high and close to the steering wheel when you drive. Add lower back (lumbar) support to your car seat, if needed.  Take short walks on even surfaces as soon as you are able. Try to increase the length of time you walk each day.  Do not sit, drive, or stand in one place for more than 30 minutes at a time. Sitting or standing for long periods of time can put stress on your back.  Do not drive or use heavy machinery while taking prescription pain medicine.  Use proper lifting techniques. When you bend and lift, use positions that put less stress on your back: ? Bend your knees. ? Keep the load close to your body. ? Avoid twisting.  Exercise regularly as told by your health care provider. Exercising helps your back heal faster and helps prevent back injuries by keeping muscles strong and flexible.  Work with a physical therapist to make a safe exercise program, as recommended by your health care provider. Do any exercises as told by your physical therapist. Lifestyle  Maintain a healthy weight. Extra weight puts stress on your back and makes it difficult to have good posture.  Avoid activities or situations that make you feel anxious or stressed. Stress and anxiety increase muscle   tension and can make back pain worse. Learn ways to manage anxiety and stress, such as through exercise. General instructions  Sleep on a firm mattress in a comfortable position. Try lying on your side with your knees slightly bent. If you lie on your back, put a pillow under your knees.  Follow your treatment plan as told by your health care provider. This may include: ? Cognitive or behavioral therapy. ? Acupuncture or massage therapy. ? Meditation or yoga. Contact a health care provider if:  You have pain that is not relieved with rest or medicine.  You have increasing pain going down  into your legs or buttocks.  Your pain does not improve after 2 weeks.  You have pain at night.  You lose weight without trying.  You have a fever or chills. Get help right away if:  You develop new bowel or bladder control problems.  You have unusual weakness or numbness in your arms or legs.  You develop nausea or vomiting.  You develop abdominal pain.  You feel faint. Summary  Acute back pain is sudden and usually short-lived.  Use proper lifting techniques. When you bend and lift, use positions that put less stress on your back.  Take over-the-counter and prescription medicines and apply heat or ice as directed by your health care provider. This information is not intended to replace advice given to you by your health care provider. Make sure you discuss any questions you have with your health care provider. Document Released: 03/05/2005 Document Revised: 06/24/2018 Document Reviewed: 10/17/2016 Elsevier Patient Education  2020 [employer]  

## 2019-01-27 DIAGNOSIS — M545 Low back pain: Secondary | ICD-10-CM | POA: Diagnosis not present

## 2019-01-28 ENCOUNTER — Other Ambulatory Visit: Payer: Self-pay

## 2019-01-28 ENCOUNTER — Encounter: Payer: Self-pay | Admitting: Cardiology

## 2019-01-28 ENCOUNTER — Ambulatory Visit (INDEPENDENT_AMBULATORY_CARE_PROVIDER_SITE_OTHER): Payer: MEDICARE | Admitting: Cardiology

## 2019-01-28 VITALS — BP 100/70 | HR 69 | Ht 72.0 in | Wt 163.0 lb

## 2019-01-28 DIAGNOSIS — Z01810 Encounter for preprocedural cardiovascular examination: Secondary | ICD-10-CM

## 2019-01-28 DIAGNOSIS — N1831 Chronic kidney disease, stage 3a: Secondary | ICD-10-CM

## 2019-01-28 DIAGNOSIS — I350 Nonrheumatic aortic (valve) stenosis: Secondary | ICD-10-CM

## 2019-01-28 DIAGNOSIS — Z951 Presence of aortocoronary bypass graft: Secondary | ICD-10-CM

## 2019-01-28 DIAGNOSIS — M7989 Other specified soft tissue disorders: Secondary | ICD-10-CM | POA: Diagnosis not present

## 2019-01-28 DIAGNOSIS — I10 Essential (primary) hypertension: Secondary | ICD-10-CM | POA: Diagnosis not present

## 2019-01-28 DIAGNOSIS — I208 Other forms of angina pectoris: Secondary | ICD-10-CM

## 2019-01-28 DIAGNOSIS — R5383 Other fatigue: Secondary | ICD-10-CM | POA: Diagnosis not present

## 2019-01-28 DIAGNOSIS — I440 Atrioventricular block, first degree: Secondary | ICD-10-CM

## 2019-01-28 DIAGNOSIS — I452 Bifascicular block: Secondary | ICD-10-CM

## 2019-01-28 HISTORY — DX: Bifascicular block: I45.2

## 2019-01-28 HISTORY — DX: Atrioventricular block, first degree: I44.0

## 2019-01-28 NOTE — Patient Instructions (Signed)
Medication Instructions:  You may discontinue your Metoprolol.  Continue all other medications as listed.  *If you need a refill on your cardiac medications before your next appointment, please call your pharmacy*  Follow-Up: At [facility], you and your health needs are our priority.  As part of our continuing mission to provide you with exceptional heart care, we have created designated Provider Care Teams.  These Care Teams include your primary Cardiologist (physician) and Advanced Practice Providers (APPs -  Physician Assistants and Nurse Practitioners) who all work together to provide you with the care you need, when you need it.  Your next appointment:   12 months  The format for your next appointment:   In Person  Provider:   In [place] with Dr Percival Spanish  Thank you for choosing [facility]!!

## 2019-02-02 ENCOUNTER — Other Ambulatory Visit (HOSPITAL_COMMUNITY)
Admission: RE | Admit: 2019-02-02 | Discharge: 2019-02-02 | Disposition: A | Discharge status: Home / Self Care | Payer: MEDICARE | Source: Ambulatory Visit | Attending: Orthopedic Surgery | Admitting: Orthopedic Surgery

## 2019-02-02 DIAGNOSIS — Z01812 Encounter for preprocedural laboratory examination: Secondary | ICD-10-CM | POA: Diagnosis not present

## 2019-02-02 DIAGNOSIS — Z20828 Contact with and (suspected) exposure to other viral communicable diseases: Secondary | ICD-10-CM | POA: Insufficient documentation

## 2019-02-03 ENCOUNTER — Encounter (HOSPITAL_COMMUNITY): Payer: Self-pay

## 2019-02-03 NOTE — Patient Instructions (Signed)
DUE TO COVID-19 ONLY ONE VISITOR IS ALLOWED TO COME WITH YOU AND STAY IN THE WAITING ROOM ONLY DURING PRE OP AND PROCEDURE DAY OF SURGERY. THE 1 VISITOR MAY VISIT WITH YOU AFTER SURGERY IN YOUR PRIVATE ROOM DURING VISITING HOURS ONLY!  YOUR COVID TEST IS COMPLETED, PLEASE BEGIN THE QUARANTINE INSTRUCTIONS AS OUTLINED IN YOUR HANDOUT.                Jon Reid   Your procedure is scheduled on: 02-05-2019   Report to [facility] Main  Entrance     Report to admitting at 12:30PM      PLEASE BRING CPAP [place]. DEVICE WILL BE PROVIDED!   Call this number if you have problems the morning of surgery [phone]    NO SOLID FOOD AFTER MIDNIGHT THE NIGHT PRIOR TO SURGERY. YOU MAY DRINK CLEAR LIQUIDS.   STOP CLEAR LIQUIDS AT ___11:30AM______ AND THEN DRINK THE ENSURE PRE-SURGERY [place]. NOTHING BY MOUTH AFTER THE ENSURE DRINK!    CLEAR LIQUID DIET   Foods Allowed                                                                     Foods Excluded  Coffee and tea, regular and decaf                             liquids that you cannot  Plain Jell-O any favor except red or purple                                           see through such as: Fruit ices (not with fruit pulp)                                     milk, soups, orange juice  Iced Popsicles                                    All solid food Carbonated beverages, regular and diet                                    Cranberry, grape and apple juices Sports drinks like Gatorade Lightly seasoned clear broth or consume(fat free) Sugar, honey syrup  Sample Menu Breakfast                                Lunch                                     Supper Cranberry juice                    Beef broth  Chicken broth Jell-O                                     Grape juice                           Apple juice Coffee or tea                        Jell-O                                       Popsicle                                                Coffee or tea                        Coffee or tea  _____________________________________________________________________   BRUSH YOUR TEETH MORNING OF SURGERY AND RINSE YOUR MOUTH OUT, NO CHEWING GUM CANDY OR MINTS.     Take these medicines the morning of surgery with A SIP OF WATER: PANTOPRAZOLE(PROTONIX), FLUTICASONE(FLONASE) NASAL SPRAY IF NEEDED                                 You may not have any metal on your body including hair pins and              piercings  Do not wear jewelry, lotions, powders or cologne, deodorant                          Men may shave face and neck.    Do not bring valuables to the hospital. [place].  Contacts, dentures or bridgework may not be worn into surgery.  YOU MAY BRING A SMALL OVERNIGHT BAG    Special Instructions: N/A              Please read over the following fact sheets you were given: _____________________________________________________________________             [facility] - Preparing for Surgery Before surgery, you can play an important role.  Because skin is not sterile, your skin needs to be as free of germs as possible.  You can reduce the number of germs on your skin by washing with CHG (chlorahexidine gluconate) soap before surgery.  CHG is an antiseptic cleaner which kills germs and bonds with the skin to continue killing germs even after washing. Please DO NOT use if you have an allergy to CHG or antibacterial soaps.  If your skin becomes reddened/irritated stop using the CHG and inform your nurse when you arrive at Short Stay. Do not shave (including legs and underarms) for at least 48 hours prior to the first CHG shower.  You may shave your face/neck. Please follow these instructions carefully:  1.  Shower with CHG Soap the night before surgery and the  morning of Surgery.  2.  If you choose to wash your hair, wash  your hair first as usual with your  normal  shampoo.  3.  After you shampoo, rinse your hair and body thoroughly to remove the  shampoo.                           4.  Use CHG as you would any other liquid soap.  You can apply chg directly  to the skin and wash                       Gently with a scrungie or clean washcloth.  5.  Apply the CHG Soap to your body ONLY FROM THE NECK DOWN.   Do not use on face/ open                           Wound or open sores. Avoid contact with eyes, ears mouth and genitals (private parts).                       Wash face,  Genitals (private parts) with your normal soap.             6.  Wash thoroughly, paying special attention to the area where your surgery  will be performed.  7.  Thoroughly rinse your body with warm water from the neck down.  8.  DO NOT shower/wash with your normal soap after using and rinsing off  the CHG Soap.                9.  Pat yourself dry with a clean towel.            10.  Wear clean pajamas.            11.  Place clean sheets on your bed the night of your first shower and do not  sleep with pets. Day of Surgery : Do not apply any lotions/deodorants the morning of surgery.  Please wear clean clothes to the hospital/surgery center.  FAILURE TO FOLLOW THESE INSTRUCTIONS MAY RESULT IN THE CANCELLATION OF YOUR SURGERY PATIENT SIGNATURE_________________________________  NURSE SIGNATURE__________________________________  ________________________________________________________________________   Jon Reid  An incentive spirometer is a tool that can help keep your lungs clear and active. This tool measures how well you are filling your lungs with each breath. Taking long deep breaths may help reverse or decrease the chance of developing breathing (pulmonary) problems (especially infection) following:  A long period of time when you are unable to move or be active. BEFORE THE PROCEDURE   If the spirometer includes an  indicator to show your best effort, your nurse or respiratory therapist will set it to a desired goal.  If possible, sit up straight or lean slightly forward. Try not to slouch.  Hold the incentive spirometer in an upright position. INSTRUCTIONS FOR USE  1. Sit on the edge of your bed if possible, or sit up as far as you can in bed or on a chair. 2. Hold the incentive spirometer in an upright position. 3. Breathe out normally. 4. Place the mouthpiece in your mouth and seal your lips tightly around it. 5. Breathe in slowly and as deeply as possible, raising the piston or the ball toward the top of the column. 6. Hold your breath for 3-5 seconds or for as long as possible. Allow the piston or ball  to fall to the bottom of the column. 7. Remove the mouthpiece from your mouth and breathe out normally. 8. Rest for a few seconds and repeat Steps 1 through 7 at least 10 times every 1-2 hours when you are awake. Take your time and take a few normal breaths between deep breaths. 9. The spirometer may include an indicator to show your best effort. Use the indicator as a goal to work toward during each repetition. 10. After each set of 10 deep breaths, practice coughing to be sure your lungs are clear. If you have an incision (the cut made at the time of surgery), support your incision when coughing by placing a pillow or rolled up towels firmly against it. Once you are able to get out of bed, walk around indoors and cough well. You may stop using the incentive spirometer when instructed by your caregiver.  RISKS AND COMPLICATIONS  Take your time so you do not get dizzy or light-headed.  If you are in pain, you may need to take or ask for pain medication before doing incentive spirometry. It is harder to take a deep breath if you are having pain. AFTER USE  Rest and breathe slowly and easily.  It can be helpful to keep track of a log of your progress. Your caregiver can provide you with a simple table  to help with this. If you are using the spirometer at home, follow these instructions: [place] IF:   You are having difficultly using the spirometer.  You have trouble using the spirometer as often as instructed.  Your pain medication is not giving enough relief while using the spirometer.  You develop fever of 100.5 F (38.1 C) or higher. SEEK IMMEDIATE MEDICAL CARE IF:   You cough up bloody sputum that had not been present before.  You develop fever of 102 F (38.9 C) or greater.  You develop worsening pain at or near the incision site. MAKE SURE YOU:   Understand these instructions.  Will watch your condition.  Will get help right away if you are not doing well or get worse. Document Released: 07/16/2006 Document Revised: 05/28/2011 Document Reviewed: 09/16/2006 [facility] Patient Information 2014 [place], [place].   ________________________________________________________________________

## 2019-02-04 ENCOUNTER — Other Ambulatory Visit: Payer: Self-pay

## 2019-02-04 ENCOUNTER — Encounter (HOSPITAL_COMMUNITY): Payer: Self-pay

## 2019-02-04 ENCOUNTER — Ambulatory Visit (HOSPITAL_COMMUNITY)
Admission: RE | Admit: 2019-02-04 | Discharge: 2019-02-04 | Disposition: A | Discharge status: Home / Self Care | Payer: MEDICARE | Source: Ambulatory Visit | Attending: Surgical | Admitting: Surgical

## 2019-02-04 ENCOUNTER — Encounter (HOSPITAL_COMMUNITY)
Admission: RE | Admit: 2019-02-04 | Discharge: 2019-02-04 | Disposition: A | Discharge status: Home / Self Care | Payer: MEDICARE | Source: Ambulatory Visit | Attending: Orthopedic Surgery | Admitting: Orthopedic Surgery

## 2019-02-04 DIAGNOSIS — R079 Chest pain, unspecified: Secondary | ICD-10-CM | POA: Diagnosis not present

## 2019-02-04 DIAGNOSIS — Z888 Allergy status to other drugs, medicaments and biological substances status: Secondary | ICD-10-CM | POA: Diagnosis not present

## 2019-02-04 DIAGNOSIS — I129 Hypertensive chronic kidney disease with stage 1 through stage 4 chronic kidney disease, or unspecified chronic kidney disease: Secondary | ICD-10-CM | POA: Diagnosis not present

## 2019-02-04 DIAGNOSIS — I452 Bifascicular block: Secondary | ICD-10-CM | POA: Diagnosis not present

## 2019-02-04 DIAGNOSIS — D62 Acute posthemorrhagic anemia: Secondary | ICD-10-CM | POA: Diagnosis not present

## 2019-02-04 DIAGNOSIS — D469 Myelodysplastic syndrome, unspecified: Secondary | ICD-10-CM | POA: Diagnosis not present

## 2019-02-04 DIAGNOSIS — I10 Essential (primary) hypertension: Secondary | ICD-10-CM | POA: Diagnosis not present

## 2019-02-04 DIAGNOSIS — I4510 Unspecified right bundle-branch block: Secondary | ICD-10-CM | POA: Diagnosis present

## 2019-02-04 DIAGNOSIS — M545 Low back pain, unspecified: Secondary | ICD-10-CM

## 2019-02-04 DIAGNOSIS — M21372 Foot drop, left foot: Secondary | ICD-10-CM | POA: Diagnosis not present

## 2019-02-04 DIAGNOSIS — R072 Precordial pain: Secondary | ICD-10-CM | POA: Diagnosis not present

## 2019-02-04 DIAGNOSIS — M21371 Foot drop, right foot: Secondary | ICD-10-CM | POA: Diagnosis not present

## 2019-02-04 DIAGNOSIS — G4730 Sleep apnea, unspecified: Secondary | ICD-10-CM | POA: Diagnosis present

## 2019-02-04 DIAGNOSIS — R7989 Other specified abnormal findings of blood chemistry: Secondary | ICD-10-CM | POA: Diagnosis not present

## 2019-02-04 DIAGNOSIS — M48062 Spinal stenosis, lumbar region with neurogenic claudication: Secondary | ICD-10-CM | POA: Diagnosis not present

## 2019-02-04 DIAGNOSIS — Z79899 Other long term (current) drug therapy: Secondary | ICD-10-CM | POA: Diagnosis not present

## 2019-02-04 DIAGNOSIS — Z951 Presence of aortocoronary bypass graft: Secondary | ICD-10-CM | POA: Diagnosis not present

## 2019-02-04 DIAGNOSIS — R339 Retention of urine, unspecified: Secondary | ICD-10-CM | POA: Diagnosis not present

## 2019-02-04 DIAGNOSIS — R778 Other specified abnormalities of plasma proteins: Secondary | ICD-10-CM | POA: Diagnosis not present

## 2019-02-04 DIAGNOSIS — X501XXA Overexertion from prolonged static or awkward postures, initial encounter: Secondary | ICD-10-CM | POA: Diagnosis not present

## 2019-02-04 DIAGNOSIS — Z419 Encounter for procedure for purposes other than remedying health state, unspecified: Secondary | ICD-10-CM | POA: Diagnosis not present

## 2019-02-04 DIAGNOSIS — M25571 Pain in right ankle and joints of right foot: Secondary | ICD-10-CM | POA: Diagnosis not present

## 2019-02-04 DIAGNOSIS — I2510 Atherosclerotic heart disease of native coronary artery without angina pectoris: Secondary | ICD-10-CM | POA: Diagnosis not present

## 2019-02-04 DIAGNOSIS — I248 Other forms of acute ischemic heart disease: Secondary | ICD-10-CM | POA: Diagnosis not present

## 2019-02-04 DIAGNOSIS — I350 Nonrheumatic aortic (valve) stenosis: Secondary | ICD-10-CM | POA: Diagnosis present

## 2019-02-04 DIAGNOSIS — S93401A Sprain of unspecified ligament of right ankle, initial encounter: Secondary | ICD-10-CM | POA: Diagnosis not present

## 2019-02-04 DIAGNOSIS — N183 Chronic kidney disease, stage 3 unspecified: Secondary | ICD-10-CM | POA: Diagnosis not present

## 2019-02-04 DIAGNOSIS — N182 Chronic kidney disease, stage 2 (mild): Secondary | ICD-10-CM | POA: Diagnosis not present

## 2019-02-04 DIAGNOSIS — K2100 Gastro-esophageal reflux disease with esophagitis, without bleeding: Secondary | ICD-10-CM | POA: Diagnosis not present

## 2019-02-04 DIAGNOSIS — Z8546 Personal history of malignant neoplasm of prostate: Secondary | ICD-10-CM | POA: Diagnosis not present

## 2019-02-04 DIAGNOSIS — I440 Atrioventricular block, first degree: Secondary | ICD-10-CM | POA: Diagnosis present

## 2019-02-04 DIAGNOSIS — Z981 Arthrodesis status: Secondary | ICD-10-CM | POA: Diagnosis not present

## 2019-02-04 DIAGNOSIS — E785 Hyperlipidemia, unspecified: Secondary | ICD-10-CM | POA: Diagnosis not present

## 2019-02-04 DIAGNOSIS — M48061 Spinal stenosis, lumbar region without neurogenic claudication: Secondary | ICD-10-CM | POA: Diagnosis not present

## 2019-02-04 DIAGNOSIS — M19071 Primary osteoarthritis, right ankle and foot: Secondary | ICD-10-CM | POA: Diagnosis not present

## 2019-02-04 DIAGNOSIS — Z9842 Cataract extraction status, left eye: Secondary | ICD-10-CM | POA: Diagnosis not present

## 2019-02-04 DIAGNOSIS — R195 Other fecal abnormalities: Secondary | ICD-10-CM | POA: Diagnosis not present

## 2019-02-04 DIAGNOSIS — Z800 Family history of malignant neoplasm of digestive organs: Secondary | ICD-10-CM | POA: Diagnosis not present

## 2019-02-04 DIAGNOSIS — M5136 Other intervertebral disc degeneration, lumbar region: Secondary | ICD-10-CM | POA: Diagnosis not present

## 2019-02-04 DIAGNOSIS — I1310 Hypertensive heart and chronic kidney disease without heart failure, with stage 1 through stage 4 chronic kidney disease, or unspecified chronic kidney disease: Secondary | ICD-10-CM | POA: Diagnosis not present

## 2019-02-04 DIAGNOSIS — Z823 Family history of stroke: Secondary | ICD-10-CM | POA: Diagnosis not present

## 2019-02-04 DIAGNOSIS — M7989 Other specified soft tissue disorders: Secondary | ICD-10-CM | POA: Diagnosis not present

## 2019-02-04 DIAGNOSIS — G9589 Other specified diseases of spinal cord: Secondary | ICD-10-CM | POA: Diagnosis not present

## 2019-02-04 DIAGNOSIS — Z7982 Long term (current) use of aspirin: Secondary | ICD-10-CM | POA: Diagnosis not present

## 2019-02-04 HISTORY — DX: Spinal stenosis, site unspecified: M48.00

## 2019-02-04 HISTORY — DX: Unspecified osteoarthritis, unspecified site: M19.90

## 2019-02-04 LAB — COMPREHENSIVE METABOLIC PANEL
ALT: 18 U/L (ref 0–44)
AST: 27 U/L (ref 15–41)
Albumin: 4.5 g/dL (ref 3.5–5.0)
Alkaline Phosphatase: 59 U/L (ref 38–126)
Anion gap: 9 (ref 5–15)
BUN: 21 mg/dL (ref 8–23)
CO2: 26 mmol/L (ref 22–32)
Calcium: 9.4 mg/dL (ref 8.9–10.3)
Chloride: 105 mmol/L (ref 98–111)
Creatinine, Ser: 1.36 mg/dL — ABNORMAL HIGH (ref 0.61–1.24)
GFR calc Af Amer: 53 mL/min — ABNORMAL LOW (ref >60)
GFR calc non Af Amer: 46 mL/min — ABNORMAL LOW (ref >60)
Glucose, Bld: 107 mg/dL — ABNORMAL HIGH (ref 70–99)
Potassium: 4.1 mmol/L (ref 3.5–5.1)
Sodium: 140 mmol/L (ref 135–145)
Total Bilirubin: 1.3 mg/dL — ABNORMAL HIGH (ref 0.3–1.2)
Total Protein: 7.1 g/dL (ref 6.5–8.1)

## 2019-02-04 LAB — CBC WITH DIFFERENTIAL/PLATELET
Abs Immature Granulocytes: 0.02 10*3/uL (ref 0.00–0.07)
Basophils Absolute: 0.1 10*3/uL (ref 0.0–0.1)
Basophils Relative: 1 %
Eosinophils Absolute: 0.1 10*3/uL (ref 0.0–0.5)
Eosinophils Relative: 1 %
HCT: 37.1 % — ABNORMAL LOW (ref 39.0–52.0)
Hemoglobin: 11.5 g/dL — ABNORMAL LOW (ref 13.0–17.0)
Immature Granulocytes: 0 %
Lymphocytes Relative: 26 %
Lymphs Abs: 1.9 10*3/uL (ref 0.7–4.0)
MCH: 31.9 pg (ref 26.0–34.0)
MCHC: 31 g/dL (ref 30.0–36.0)
MCV: 102.8 fL — ABNORMAL HIGH (ref 80.0–100.0)
Monocytes Absolute: 0.6 10*3/uL (ref 0.1–1.0)
Monocytes Relative: 8 %
Neutro Abs: 4.8 10*3/uL (ref 1.7–7.7)
Neutrophils Relative %: 64 %
Platelets: 249 10*3/uL (ref 150–400)
RBC: 3.61 MIL/uL — ABNORMAL LOW (ref 4.22–5.81)
RDW: 13.6 % (ref 11.5–15.5)
WBC: 7.5 10*3/uL (ref 4.0–10.5)
nRBC: 0 % (ref 0.0–0.2)

## 2019-02-04 LAB — APTT: aPTT: 55 seconds — ABNORMAL HIGH (ref 24–36)

## 2019-02-04 LAB — PROTIME-INR
INR: 1 (ref 0.8–1.2)
Prothrombin Time: 13.4 seconds (ref 11.4–15.2)

## 2019-02-04 LAB — ABO/RH: ABO/RH(D): O POS

## 2019-02-04 LAB — NOVEL CORONAVIRUS, NAA (HOSP ORDER, SEND-OUT TO REF LAB; TAT 18-24 HRS): SARS-CoV-2, NAA: NOT DETECTED

## 2019-02-04 MED ORDER — BUPIVACAINE LIPOSOME 1.3 % IJ SUSP
20.0000 mL | Freq: Once | INTRAMUSCULAR | Status: DC
  Filled 2019-02-04: qty 20

## 2019-02-04 NOTE — Pre-Procedure Instructions (Signed)
PCP - L. Rakes: Family Nurse Practitioner Cardiologist -: J Hocherin. Clearance. Last office visit 01/28/2019. Epic..   Chest x-ray - More than a year EKG -  01/28/2019. Epic Stress Test -  ECHO - 12/06/2017 Cardiac Cath -   Sleep Study - NA CPAP - NA  Fasting Blood Sugar -  Checks Blood Sugar _____ times a day  Blood Thinner Instructions:By Dr. Anders Simmonds Aspirin Instructions:Stoped on 01/31/2019 Last Dose:01/31/2019  Anesthesia review: OSA,CKD,CAD,CABG  Patient denies shortness of breath, fever, cough and chest pain at PAT appointment   Patient verbalized understanding of instructions that were given to them at the PAT appointment. Patient was also instructed that they will need to review over the PAT instructions again at home before surgery.

## 2019-02-04 NOTE — Progress Notes (Addendum)
Anesthesia Chart Review   Case: [id number] Date/Time: 02/05/19 1415   Procedure: LUMBAR LAMINECTOMY/DECOMPRESSION L3-L4 (N/A ) - 10min   Anesthesia type: General   Pre-op diagnosis: spinal stenosis L3-L4 complete block   [facility] ROOM 08 / [facility]   Surgeon: Latanya Maudlin, MD      DISCUSSION:85 y.o. never smoker with h/o HTN, CKD Stage II, GERD, CAD (CABG 2014), RBBB, moderate aortic stenosis, spinal stenosis L3-4 scheduled for above procedure 02/05/2019 with Dr. Latanya Maudlin.    Pt last seen by Dr. Minus Breeding, 01/28/2019.  Per OV note, "AS This was moderate on echo in 2019.  I would not suspect that this has changed clinically.  No change in therapy. The patient has no high risk features for back surgery that is being considered.  This is not considered a high risk procedure.  He has a moderate functional level.  According to preop cardiovascular guidelines he would be at moderate risk for cardiovascular complications given his advanced age and moderate functional level.  He would be at some risk for bradycardia arrhythmias and should be monitored closely for this intra and postop.  However, there are no prohibitive contraindications.  He had a negative perfusion study a few years ago.  No further cardiovascular testing would be suggested.  I had a long discussion with him about this."  Discussed with Dr. Fransisco Beau.  Anticipate pt can proceed with planned procedure barring acute status change.   VS: BP (!) 178/84   Pulse 68   Temp 36.7 C (Oral)   Resp 18   Ht 5\' 11"  (1.803 m)   Wt 72.4 kg   SpO2 100%   BMI 22.27 kg/m   PROVIDERS: Rakes, Connye Burkitt, FNP is PCP   Minus Breeding, MD is Cardiologist  LABS: Labs reviewed: Acceptable for surgery. (all labs ordered are listed, but only abnormal results are displayed)  Labs Reviewed  APTT - Abnormal; Notable for the following components:      Result Value   aPTT 55 (*)    All other components within normal limits  CBC WITH  DIFFERENTIAL/PLATELET - Abnormal; Notable for the following components:   RBC 3.61 (*)    Hemoglobin 11.5 (*)    HCT 37.1 (*)    MCV 102.8 (*)    All other components within normal limits  PROTIME-INR  COMPREHENSIVE METABOLIC PANEL  TYPE AND SCREEN     IMAGES:   EKG: 01/28/2019 Rate 69 bpm Sinus rhythm with 1st degree AB block  Right bundle branch block  Left anterior fascicular block  Bifascicular block   CV: Echo 12/06/2017 Study Conclusions  - Left ventricle: The cavity size was normal. Wall thickness was   increased in a pattern of mild LVH. Systolic function was normal.   The estimated ejection fraction was in the range of 60% to 65%.   Wall motion was normal; there were no regional wall motion   abnormalities. Left ventricular diastolic function parameters   were normal. - Aortic valve: Moderate thickening and calcification involving the   noncoronary cusp. Noncoronary cusp mobility was restricted. There   was moderate stenosis. There was trivial regurgitation. Mean   gradient (S): 17 mm Hg. Peak gradient (S): 34 mm Hg. VTI ratio of   LVOT to aortic valve: 0.32. Valve area by planimetry 1.24 cm2. - Mitral valve: Mildly to moderately calcified annulus. There was   mild regurgitation. Valve area by pressure half-time: 2.27 cm^2.   Valve area by continuity equation (using LVOT  flow): 1.83 cm^2. - Left atrium: The atrium was mildly dilated. - Right atrium: Central venous pressure (est): 3 mm Hg. - Atrial septum: No defect or patent foramen ovale was identified. - Tricuspid valve: There was mild regurgitation. - Pulmonary arteries: PA peak pressure: 34 mm Hg (S). - Pericardium, extracardiac: There was no pericardial effusion. Past Medical History:  Diagnosis Date  . Arthritis   . Carcinoma of prostate [facility])    prostate  . CKD (chronic kidney disease) stage 2, GFR 60-89 ml/min   . Coronary artery disease    a. LHC (8/14):  3v CAD => s/p CABG (LIMA-LAD, SVG-OM1,  SVG-PDA)  . First degree AV block 01/28/2019   Noted on EKG  . GERD (gastroesophageal reflux disease)   . Gout   . HTN (hypertension)   . Hx of echocardiogram    Echocardiogram 11/13/12: EF 55-60%, normal wall motion, biatrial enlargement, mild AI, mild TR, no pericardial effusion  . Moderate aortic stenosis 12/06/2017   Noted on ECHO  . 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 Sleep apnea    Questionable  . Spinal stenosis     Past Surgical History:  Procedure Laterality Date  . CARDIAC CATHETERIZATION  11/07/2012   Dr Acie Fredrickson  . CATARACT EXTRACTION W/ INTRAOCULAR LENS IMPLANT Bilateral   . CORONARY ARTERY BYPASS GRAFT N/A 11/11/2012   Procedure: CORONARY ARTERY BYPASS GRAFTING times four using Right Greater Saphenous Vein Graft harvested endoscopically and Left Internal Mammary Artery.;  Surgeon: Ivin Poot, MD;  Location: [place];  Service: Open Heart Surgery;  Laterality: N/A;  . INGUINAL HERNIA REPAIR Right 01/10/2017   Procedure: OPEN RIGHT HERNIA REPAIR INGUINAL;  Surgeon: Ileana Roup, MD;  [facility];  Service: General;  Laterality: Right;  . INSERTION OF MESH Right 01/10/2017   Procedure: INSERTION OF MESH;  Surgeon: Ileana Roup, MD;  [facility];  Service: General;  Laterality: Right;  . INTRAOPERATIVE TRANSESOPHAGEAL ECHOCARDIOGRAM N/A 11/11/2012   Procedure: INTRAOPERATIVE TRANSESOPHAGEAL ECHOCARDIOGRAM;  Surgeon: Ivin Poot, MD;  Location: [place];  Service: Open Heart Surgery;  Laterality: N/A;  . LEFT HEART CATHETERIZATION WITH CORONARY ANGIOGRAM N/A 11/07/2012   Procedure: LEFT HEART CATHETERIZATION WITH CORONARY ANGIOGRAM;  Surgeon: Thayer Headings, MD;  Location: [facility] CATH LAB;  Service: Cardiovascular;  Laterality: N/A;  . LYMPH NODE DISSECTION     Bilateral pelvic  . RETROPUBIC PROSTATECTOMY     Radical    MEDICATIONS: . aspirin EC 81 MG tablet  . Cholecalciferol (VITAMIN D) 2000  units CAPS  . diclofenac sodium (VOLTAREN) 1 % GEL  . fluticasone (FLONASE) 50 MCG/ACT nasal spray  . furosemide (LASIX) 40 MG tablet  . nitroGLYCERIN (NITROSTAT) 0.4 MG SL tablet  . pantoprazole (PROTONIX) 40 MG tablet  . triamcinolone cream (KENALOG) 0.1 %   No current facility-administered medications for this encounter.    Derrill Memo ON 02/05/2019] bupivacaine liposome (EXPAREL) 1.3 % injection 266 mg     Maia Plan [facility] Pre-Surgical Testing [phone] 02/04/19  3:17 PM

## 2019-02-04 NOTE — Anesthesia Preprocedure Evaluation (Addendum)
Anesthesia Evaluation  Patient identified by MRN, date of birth, ID band Patient awake    Reviewed: Allergy & Precautions, NPO status , Patient's Chart, lab work & pertinent test results  Airway Mallampati: II  TM Distance: >3 FB Neck ROM: Full    Dental  (+) Teeth Intact, Dental Advisory Given   Pulmonary sleep apnea ,    breath sounds clear to auscultation       Cardiovascular hypertension, + CAD, + CABG and + Peripheral Vascular Disease  + dysrhythmias + Valvular Problems/Murmurs AS  Rhythm:Regular Rate:Normal + Systolic murmurs    Neuro/Psych negative neurological ROS  negative psych ROS   GI/Hepatic Neg liver ROS, GERD  ,  Endo/Other  negative endocrine ROS  Renal/GU Renal InsufficiencyRenal disease     Musculoskeletal  (+) Arthritis ,   Abdominal Normal abdominal exam  (+)   Peds  Hematology negative hematology ROS (+)   Anesthesia Other Findings   Reproductive/Obstetrics                           Lab Results  Component Value Date   WBC 7.5 02/04/2019   HGB 11.5 (L) 02/04/2019   HCT 37.1 (L) 02/04/2019   MCV 102.8 (H) 02/04/2019   PLT 249 02/04/2019   Lab Results  Component Value Date   CREATININE 1.36 (H) 02/04/2019   BUN 21 02/04/2019   NA 140 02/04/2019   K 4.1 02/04/2019   CL 105 02/04/2019   CO2 26 02/04/2019   Lab Results  Component Value Date   INR 1.0 02/04/2019   INR 1.00 01/02/2017   INR 1.45 11/11/2012   EKG: normal sinus rhythm, 1st degree AV block.  Echo: - Left ventricle: The cavity size was normal. Wall thickness was   increased in a pattern of mild LVH. Systolic function was normal.   The estimated ejection fraction was in the range of 60% to 65%.   Wall motion was normal; there were no regional wall motion   abnormalities. Left ventricular diastolic function parameters   were normal. - Aortic valve: Moderate thickening and calcification involving  the   noncoronary cusp. Noncoronary cusp mobility was restricted. There   was moderate stenosis. There was trivial regurgitation. Mean   gradient (S): 17 mm Hg. Peak gradient (S): 34 mm Hg. VTI ratio of   LVOT to aortic valve: 0.32. Valve area by planimetry 1.24 cm2. - Mitral valve: Mildly to moderately calcified annulus. There was   mild regurgitation. Valve area by pressure half-time: 2.27 cm^2.   Valve area by continuity equation (using LVOT flow): 1.83 cm^2. - Left atrium: The atrium was mildly dilated. - Right atrium: Central venous pressure (est): 3 mm Hg. - Atrial septum: No defect or patent foramen ovale was identified. - Tricuspid valve: There was mild regurgitation. - Pulmonary arteries: PA peak pressure: 34 mm Hg (S). - Pericardium, extracardiac: There was no pericardial effusion.  Anesthesia Physical Anesthesia Plan  ASA: III  Anesthesia Plan: General   Post-op Pain Management:    Induction: Intravenous  PONV Risk Score and Plan: 3 and Ondansetron and Treatment may vary due to age or medical condition  Airway Management Planned: Oral ETT  Additional Equipment: None  Intra-op Plan:   Post-operative Plan: Extubation in OR  Informed Consent:   Plan Discussed with: CRNA  Anesthesia Plan Comments: (See PAT note 02/04/2019, Konrad Felix, PA-C)       Anesthesia Quick Evaluation

## 2019-02-05 ENCOUNTER — Encounter (HOSPITAL_COMMUNITY): Payer: Self-pay | Admitting: *Deleted

## 2019-02-05 ENCOUNTER — Inpatient Hospital Stay (HOSPITAL_COMMUNITY)
Admission: RE | Admit: 2019-02-05 | Discharge: 2019-02-10 | DRG: 516 | Disposition: A | Discharge status: Home Health | Payer: MEDICARE | Attending: Orthopedic Surgery | Admitting: Orthopedic Surgery

## 2019-02-05 ENCOUNTER — Ambulatory Visit (HOSPITAL_COMMUNITY): Payer: MEDICARE

## 2019-02-05 ENCOUNTER — Ambulatory Visit (HOSPITAL_COMMUNITY): Payer: MEDICARE | Admitting: Certified Registered Nurse Anesthetist

## 2019-02-05 ENCOUNTER — Ambulatory Visit (HOSPITAL_COMMUNITY): Payer: MEDICARE | Admitting: Physician Assistant

## 2019-02-05 ENCOUNTER — Encounter (HOSPITAL_COMMUNITY)
Admission: RE | Disposition: A | Discharge status: Home / Self Care | Payer: Self-pay | Source: Home / Self Care | Attending: Orthopedic Surgery

## 2019-02-05 DIAGNOSIS — Z8546 Personal history of malignant neoplasm of prostate: Secondary | ICD-10-CM | POA: Diagnosis not present

## 2019-02-05 DIAGNOSIS — Z9842 Cataract extraction status, left eye: Secondary | ICD-10-CM

## 2019-02-05 DIAGNOSIS — I350 Nonrheumatic aortic (valve) stenosis: Secondary | ICD-10-CM | POA: Diagnosis present

## 2019-02-05 DIAGNOSIS — Z419 Encounter for procedure for purposes other than remedying health state, unspecified: Secondary | ICD-10-CM

## 2019-02-05 DIAGNOSIS — K2100 Gastro-esophageal reflux disease with esophagitis, without bleeding: Secondary | ICD-10-CM | POA: Diagnosis not present

## 2019-02-05 DIAGNOSIS — I1310 Hypertensive heart and chronic kidney disease without heart failure, with stage 1 through stage 4 chronic kidney disease, or unspecified chronic kidney disease: Secondary | ICD-10-CM | POA: Diagnosis present

## 2019-02-05 DIAGNOSIS — M25473 Effusion, unspecified ankle: Secondary | ICD-10-CM

## 2019-02-05 DIAGNOSIS — M21372 Foot drop, left foot: Secondary | ICD-10-CM | POA: Diagnosis present

## 2019-02-05 DIAGNOSIS — I248 Other forms of acute ischemic heart disease: Secondary | ICD-10-CM | POA: Diagnosis not present

## 2019-02-05 DIAGNOSIS — I10 Essential (primary) hypertension: Secondary | ICD-10-CM | POA: Diagnosis present

## 2019-02-05 DIAGNOSIS — Z9841 Cataract extraction status, right eye: Secondary | ICD-10-CM

## 2019-02-05 DIAGNOSIS — R778 Other specified abnormalities of plasma proteins: Secondary | ICD-10-CM

## 2019-02-05 DIAGNOSIS — N183 Chronic kidney disease, stage 3 unspecified: Secondary | ICD-10-CM | POA: Diagnosis present

## 2019-02-05 DIAGNOSIS — G9589 Other specified diseases of spinal cord: Secondary | ICD-10-CM | POA: Diagnosis not present

## 2019-02-05 DIAGNOSIS — D469 Myelodysplastic syndrome, unspecified: Secondary | ICD-10-CM | POA: Diagnosis present

## 2019-02-05 DIAGNOSIS — I129 Hypertensive chronic kidney disease with stage 1 through stage 4 chronic kidney disease, or unspecified chronic kidney disease: Secondary | ICD-10-CM | POA: Diagnosis not present

## 2019-02-05 DIAGNOSIS — Z888 Allergy status to other drugs, medicaments and biological substances status: Secondary | ICD-10-CM

## 2019-02-05 DIAGNOSIS — G4730 Sleep apnea, unspecified: Secondary | ICD-10-CM | POA: Diagnosis present

## 2019-02-05 DIAGNOSIS — Z800 Family history of malignant neoplasm of digestive organs: Secondary | ICD-10-CM

## 2019-02-05 DIAGNOSIS — I440 Atrioventricular block, first degree: Secondary | ICD-10-CM | POA: Diagnosis present

## 2019-02-05 DIAGNOSIS — Z951 Presence of aortocoronary bypass graft: Secondary | ICD-10-CM | POA: Diagnosis not present

## 2019-02-05 DIAGNOSIS — M48061 Spinal stenosis, lumbar region without neurogenic claudication: Secondary | ICD-10-CM | POA: Diagnosis not present

## 2019-02-05 DIAGNOSIS — N182 Chronic kidney disease, stage 2 (mild): Secondary | ICD-10-CM | POA: Diagnosis not present

## 2019-02-05 DIAGNOSIS — D62 Acute posthemorrhagic anemia: Secondary | ICD-10-CM | POA: Diagnosis not present

## 2019-02-05 DIAGNOSIS — I2510 Atherosclerotic heart disease of native coronary artery without angina pectoris: Secondary | ICD-10-CM | POA: Diagnosis not present

## 2019-02-05 DIAGNOSIS — I452 Bifascicular block: Secondary | ICD-10-CM | POA: Diagnosis present

## 2019-02-05 DIAGNOSIS — M21371 Foot drop, right foot: Secondary | ICD-10-CM | POA: Diagnosis not present

## 2019-02-05 DIAGNOSIS — R079 Chest pain, unspecified: Secondary | ICD-10-CM | POA: Diagnosis not present

## 2019-02-05 DIAGNOSIS — I4510 Unspecified right bundle-branch block: Secondary | ICD-10-CM | POA: Diagnosis present

## 2019-02-05 DIAGNOSIS — R339 Retention of urine, unspecified: Secondary | ICD-10-CM | POA: Diagnosis not present

## 2019-02-05 DIAGNOSIS — X501XXA Overexertion from prolonged static or awkward postures, initial encounter: Secondary | ICD-10-CM

## 2019-02-05 DIAGNOSIS — Z7982 Long term (current) use of aspirin: Secondary | ICD-10-CM

## 2019-02-05 DIAGNOSIS — Z9079 Acquired absence of other genital organ(s): Secondary | ICD-10-CM

## 2019-02-05 DIAGNOSIS — Z79899 Other long term (current) drug therapy: Secondary | ICD-10-CM

## 2019-02-05 DIAGNOSIS — S93401A Sprain of unspecified ligament of right ankle, initial encounter: Secondary | ICD-10-CM | POA: Diagnosis not present

## 2019-02-05 DIAGNOSIS — Z961 Presence of intraocular lens: Secondary | ICD-10-CM | POA: Diagnosis present

## 2019-02-05 DIAGNOSIS — K449 Diaphragmatic hernia without obstruction or gangrene: Secondary | ICD-10-CM | POA: Diagnosis present

## 2019-02-05 DIAGNOSIS — R600 Localized edema: Secondary | ICD-10-CM

## 2019-02-05 DIAGNOSIS — M48062 Spinal stenosis, lumbar region with neurogenic claudication: Secondary | ICD-10-CM | POA: Diagnosis not present

## 2019-02-05 DIAGNOSIS — E785 Hyperlipidemia, unspecified: Secondary | ICD-10-CM | POA: Diagnosis not present

## 2019-02-05 DIAGNOSIS — R195 Other fecal abnormalities: Secondary | ICD-10-CM

## 2019-02-05 DIAGNOSIS — Z823 Family history of stroke: Secondary | ICD-10-CM

## 2019-02-05 DIAGNOSIS — Z791 Long term (current) use of non-steroidal anti-inflammatories (NSAID): Secondary | ICD-10-CM

## 2019-02-05 DIAGNOSIS — Z981 Arthrodesis status: Secondary | ICD-10-CM | POA: Diagnosis not present

## 2019-02-05 HISTORY — PX: LUMBAR LAMINECTOMY/DECOMPRESSION MICRODISCECTOMY: SHX5026

## 2019-02-05 LAB — SURGICAL PCR SCREEN
MRSA, PCR: NEGATIVE
Staphylococcus aureus: NEGATIVE

## 2019-02-05 SURGERY — LUMBAR LAMINECTOMY/DECOMPRESSION MICRODISCECTOMY 1 LEVEL
Anesthesia: General | Site: Back

## 2019-02-05 MED ORDER — SODIUM CHLORIDE 0.9 % IV SOLN
INTRAVENOUS | Status: AC
  Filled 2019-02-05: qty 500000

## 2019-02-05 MED ORDER — HYDROCODONE-ACETAMINOPHEN 7.5-325 MG PO TABS: 2.0000 | ORAL_TABLET | ORAL | Status: DC | PRN

## 2019-02-05 MED ORDER — CHLORHEXIDINE GLUCONATE 4 % EX LIQD: 60.0000 mL | Freq: Once | CUTANEOUS | Status: DC

## 2019-02-05 MED ORDER — BUPIVACAINE-EPINEPHRINE 0.5% -1:200000 IJ SOLN
INTRAMUSCULAR | Status: DC | PRN
  Administered 2019-02-05: 15 mL

## 2019-02-05 MED ORDER — CEFAZOLIN SODIUM-DEXTROSE 1-4 GM/50ML-% IV SOLN
1.0000 g | Freq: Three times a day (TID) | INTRAVENOUS | Status: AC
  Administered 2019-02-05 – 2019-02-06 (×3): 1 g via INTRAVENOUS
  Filled 2019-02-05 (×3): qty 50

## 2019-02-05 MED ORDER — BACITRACIN ZINC 500 UNIT/GM EX OINT
TOPICAL_OINTMENT | CUTANEOUS | Status: AC
  Filled 2019-02-05: qty 28.35

## 2019-02-05 MED ORDER — BISACODYL 5 MG PO TBEC
5.0000 mg | DELAYED_RELEASE_TABLET | Freq: Every day | ORAL | Status: DC | PRN
  Administered 2019-02-08: 10:00:00 5 mg via ORAL
  Filled 2019-02-05: qty 1

## 2019-02-05 MED ORDER — PHENOL 1.4 % MT LIQD: 1.0000 | OROMUCOSAL | Status: DC | PRN

## 2019-02-05 MED ORDER — PANTOPRAZOLE SODIUM 40 MG PO TBEC
40.0000 mg | DELAYED_RELEASE_TABLET | Freq: Every day | ORAL | Status: DC
  Administered 2019-02-05 – 2019-02-10 (×6): 40 mg via ORAL
  Filled 2019-02-05 (×6): qty 1

## 2019-02-05 MED ORDER — BUPIVACAINE LIPOSOME 1.3 % IJ SUSP
INTRAMUSCULAR | Status: DC | PRN
  Administered 2019-02-05: 20 mL

## 2019-02-05 MED ORDER — FLUTICASONE PROPIONATE 50 MCG/ACT NA SUSP: 1.0000 | Freq: Every day | NASAL | Status: DC | PRN

## 2019-02-05 MED ORDER — LIDOCAINE 2% (20 MG/ML) 5 ML SYRINGE
INTRAMUSCULAR | Status: DC | PRN
  Administered 2019-02-05: 80 mg via INTRAVENOUS

## 2019-02-05 MED ORDER — THROMBIN (RECOMBINANT) 5000 UNITS EX SOLR
CUTANEOUS | Status: AC
  Filled 2019-02-05: qty 5000

## 2019-02-05 MED ORDER — PHENYLEPHRINE HCL-NACL 10-0.9 MG/250ML-% IV SOLN
INTRAVENOUS | Status: DC | PRN
  Administered 2019-02-05: 25 ug/min via INTRAVENOUS

## 2019-02-05 MED ORDER — POLYETHYLENE GLYCOL 3350 17 G PO PACK
17.0000 g | PACK | Freq: Every day | ORAL | Status: DC | PRN
  Administered 2019-02-08: 17 g via ORAL
  Filled 2019-02-05: qty 1

## 2019-02-05 MED ORDER — ACETAMINOPHEN 325 MG PO TABS
650.0000 mg | ORAL_TABLET | ORAL | Status: DC | PRN
  Administered 2019-02-08 – 2019-02-09 (×3): 650 mg via ORAL
  Filled 2019-02-05 (×4): qty 2

## 2019-02-05 MED ORDER — ACETAMINOPHEN 650 MG RE SUPP: 650.0000 mg | RECTAL | Status: DC | PRN

## 2019-02-05 MED ORDER — ONDANSETRON HCL 4 MG/2ML IJ SOLN
4.0000 mg | Freq: Four times a day (QID) | INTRAMUSCULAR | Status: DC | PRN
  Administered 2019-02-06 (×2): 4 mg via INTRAVENOUS
  Filled 2019-02-05 (×2): qty 2

## 2019-02-05 MED ORDER — FENTANYL CITRATE (PF) 100 MCG/2ML IJ SOLN
INTRAMUSCULAR | Status: DC | PRN
  Administered 2019-02-05 (×2): 50 ug via INTRAVENOUS

## 2019-02-05 MED ORDER — ONDANSETRON HCL 4 MG/2ML IJ SOLN
INTRAMUSCULAR | Status: DC | PRN
  Administered 2019-02-05: 4 mg via INTRAVENOUS

## 2019-02-05 MED ORDER — ROCURONIUM BROMIDE 10 MG/ML (PF) SYRINGE
PREFILLED_SYRINGE | INTRAVENOUS | Status: DC | PRN
  Administered 2019-02-05: 10 mg via INTRAVENOUS
  Administered 2019-02-05: 50 mg via INTRAVENOUS

## 2019-02-05 MED ORDER — CEFAZOLIN SODIUM-DEXTROSE 2-4 GM/100ML-% IV SOLN
2.0000 g | INTRAVENOUS | Status: AC
  Administered 2019-02-05: 2 g via INTRAVENOUS
  Filled 2019-02-05: qty 100

## 2019-02-05 MED ORDER — METHOCARBAMOL 500 MG IVPB - SIMPLE MED
500.0000 mg | Freq: Four times a day (QID) | INTRAVENOUS | Status: DC | PRN
  Filled 2019-02-05: qty 50

## 2019-02-05 MED ORDER — PROPOFOL 10 MG/ML IV BOLUS
INTRAVENOUS | Status: DC | PRN
  Administered 2019-02-05: 120 mg via INTRAVENOUS

## 2019-02-05 MED ORDER — ENSURE ENLIVE PO LIQD
237.0000 mL | Freq: Two times a day (BID) | ORAL | Status: DC
  Administered 2019-02-06 – 2019-02-10 (×8): 237 mL via ORAL

## 2019-02-05 MED ORDER — DEXAMETHASONE SODIUM PHOSPHATE 10 MG/ML IJ SOLN
INTRAMUSCULAR | Status: DC | PRN
  Administered 2019-02-05: 10 mg via INTRAVENOUS

## 2019-02-05 MED ORDER — NITROGLYCERIN 0.4 MG SL SUBL
0.4000 mg | SUBLINGUAL_TABLET | SUBLINGUAL | Status: DC | PRN
  Filled 2019-02-05: qty 1

## 2019-02-05 MED ORDER — BUPIVACAINE-EPINEPHRINE 0.25% -1:200000 IJ SOLN
INTRAMUSCULAR | Status: AC
  Filled 2019-02-05: qty 1

## 2019-02-05 MED ORDER — FLEET ENEMA 7-19 GM/118ML RE ENEM
1.0000 | ENEMA | Freq: Once | RECTAL | Status: AC | PRN
  Administered 2019-02-09: 1 via RECTAL
  Filled 2019-02-05: qty 1

## 2019-02-05 MED ORDER — BACITRACIN ZINC 500 UNIT/GM EX OINT
TOPICAL_OINTMENT | CUTANEOUS | Status: DC | PRN
  Administered 2019-02-05: 1 via TOPICAL

## 2019-02-05 MED ORDER — METHOCARBAMOL 500 MG PO TABS: 500.0000 mg | ORAL_TABLET | Freq: Four times a day (QID) | ORAL | Status: DC | PRN

## 2019-02-05 MED ORDER — LACTATED RINGERS IV SOLN
INTRAVENOUS | Status: DC
  Administered 2019-02-05 (×2): via INTRAVENOUS

## 2019-02-05 MED ORDER — FUROSEMIDE 40 MG PO TABS
40.0000 mg | ORAL_TABLET | Freq: Every day | ORAL | Status: DC
  Administered 2019-02-08 – 2019-02-10 (×3): 40 mg via ORAL
  Filled 2019-02-05 (×4): qty 1

## 2019-02-05 MED ORDER — FENTANYL CITRATE (PF) 100 MCG/2ML IJ SOLN: 25.0000 ug | INTRAMUSCULAR | Status: DC | PRN

## 2019-02-05 MED ORDER — HYDROMORPHONE HCL 1 MG/ML IJ SOLN: 0.5000 mg | INTRAMUSCULAR | Status: DC | PRN

## 2019-02-05 MED ORDER — ONDANSETRON HCL 4 MG PO TABS: 4.0000 mg | ORAL_TABLET | Freq: Four times a day (QID) | ORAL | Status: DC | PRN

## 2019-02-05 MED ORDER — MENTHOL 3 MG MT LOZG: 1.0000 | LOZENGE | OROMUCOSAL | Status: DC | PRN

## 2019-02-05 MED ORDER — HYDROCODONE-ACETAMINOPHEN 5-325 MG PO TABS: 1.0000 | ORAL_TABLET | ORAL | Status: DC | PRN

## 2019-02-05 MED ORDER — PROPOFOL 10 MG/ML IV BOLUS
INTRAVENOUS | Status: AC
  Filled 2019-02-05: qty 20

## 2019-02-05 MED ORDER — SODIUM CHLORIDE 0.9 % IV SOLN
INTRAVENOUS | Status: DC | PRN
  Administered 2019-02-05: 500 mL

## 2019-02-05 MED ORDER — THROMBIN (RECOMBINANT) 5000 UNITS EX SOLR
CUTANEOUS | Status: DC | PRN
  Administered 2019-02-05: 5000 [IU] via TOPICAL

## 2019-02-05 MED ORDER — LACTATED RINGERS IV SOLN
INTRAVENOUS | Status: DC
  Administered 2019-02-05 – 2019-02-06 (×2): via INTRAVENOUS

## 2019-02-05 MED ORDER — SUGAMMADEX SODIUM 200 MG/2ML IV SOLN
INTRAVENOUS | Status: DC | PRN
  Administered 2019-02-05: 175 mg via INTRAVENOUS

## 2019-02-05 MED ORDER — FENTANYL CITRATE (PF) 100 MCG/2ML IJ SOLN
INTRAMUSCULAR | Status: AC
  Filled 2019-02-05: qty 2

## 2019-02-05 MED ORDER — PHENYLEPHRINE HCL (PRESSORS) 10 MG/ML IV SOLN
INTRAVENOUS | Status: AC
  Filled 2019-02-05: qty 1

## 2019-02-05 SURGICAL SUPPLY — 44 items
AGENT HMST SPONGE THK3/8 (HEMOSTASIS) ×1
BAG DECANTER FOR FLEXI CONT (MISCELLANEOUS) ×2 IMPLANT
BAG SPEC THK2 15X12 ZIP CLS (MISCELLANEOUS)
BAG ZIPLOCK 12X15 (MISCELLANEOUS) IMPLANT
CLEANER TIP ELECTROSURG 2X2 (MISCELLANEOUS) ×2 IMPLANT
CLSR STERI-STRIP ANTIMIC 1/2X4 (GAUZE/BANDAGES/DRESSINGS) ×1 IMPLANT
COVER SURGICAL LIGHT HANDLE (MISCELLANEOUS) ×2 IMPLANT
COVER WAND RF STERILE (DRAPES) IMPLANT
DRAPE MICROSCOPE LEICA (MISCELLANEOUS) ×2 IMPLANT
DRAPE POUCH INSTRU U-SHP 10X18 (DRAPES) ×2 IMPLANT
DRAPE SHEET LG 3/4 BI-LAMINATE (DRAPES) ×2 IMPLANT
DRAPE SURG 17X11 SM STRL (DRAPES) ×2 IMPLANT
DRSG ADAPTIC 3X8 NADH LF (GAUZE/BANDAGES/DRESSINGS) ×2 IMPLANT
DRSG PAD ABDOMINAL 8X10 ST (GAUZE/BANDAGES/DRESSINGS) ×3 IMPLANT
DURAPREP 26ML APPLICATOR (WOUND CARE) ×2 IMPLANT
ELECT BLADE TIP CTD 4 INCH (ELECTRODE) ×2 IMPLANT
ELECT REM PT RETURN 15FT ADLT (MISCELLANEOUS) ×2 IMPLANT
GAUZE SPONGE 4X4 12PLY STRL (GAUZE/BANDAGES/DRESSINGS) ×2 IMPLANT
GLOVE BIOGEL PI IND STRL 8.5 (GLOVE) ×1 IMPLANT
GLOVE BIOGEL PI INDICATOR 8.5 (GLOVE) ×1
GLOVE ECLIPSE 8.0 STRL XLNG CF (GLOVE) ×2 IMPLANT
GOWN STRL REUS W/TWL XL LVL3 (GOWN DISPOSABLE) ×2 IMPLANT
HEMOSTAT SPONGE AVITENE ULTRA (HEMOSTASIS) ×2 IMPLANT
KIT BASIN OR (CUSTOM PROCEDURE TRAY) ×2 IMPLANT
KIT TURNOVER KIT A (KITS) IMPLANT
MANIFOLD NEPTUNE II (INSTRUMENTS) ×2 IMPLANT
NDL SPNL 18GX3.5 QUINCKE PK (NEEDLE) ×2 IMPLANT
NEEDLE HYPO 22GX1.5 SAFETY (NEEDLE) ×2 IMPLANT
NEEDLE SPNL 18GX3.5 QUINCKE PK (NEEDLE) ×4 IMPLANT
PACK LAMINECTOMY ORTHO (CUSTOM PROCEDURE TRAY) ×2 IMPLANT
PATTIES SURGICAL .5 X.5 (GAUZE/BANDAGES/DRESSINGS) IMPLANT
PATTIES SURGICAL .75X.75 (GAUZE/BANDAGES/DRESSINGS) ×2 IMPLANT
PATTIES SURGICAL 1X1 (DISPOSABLE) ×1 IMPLANT
SPONGE LAP 4X18 RFD (DISPOSABLE) IMPLANT
STAPLER VISISTAT 35W (STAPLE) ×2 IMPLANT
STRIP CLOSURE SKIN 1/2X4 (GAUZE/BANDAGES/DRESSINGS) ×2 IMPLANT
SUT VIC AB 1 CT1 27 (SUTURE) ×4
SUT VIC AB 1 CT1 27XBRD ANTBC (SUTURE) ×2 IMPLANT
SUT VIC AB 2-0 CT1 27 (SUTURE) ×4
SUT VIC AB 2-0 CT1 TAPERPNT 27 (SUTURE) ×2 IMPLANT
SYR 20ML LL LF (SYRINGE) ×4 IMPLANT
TAPE CLOTH SURG 6X10 WHT LF (GAUZE/BANDAGES/DRESSINGS) ×1 IMPLANT
TOWEL OR 17X26 10 PK STRL BLUE (TOWEL DISPOSABLE) ×2 IMPLANT
TOWEL OR NON WOVEN STRL DISP B (DISPOSABLE) ×2 IMPLANT

## 2019-02-05 NOTE — H&P (Signed)
Jon Reid is an 85 y.o. male.   Chief Complaint: low back pain HPI: The patient is an 85 year old male who presented to the office with the chief complaint of low back pain. He reported that he had his right knee gave out on him and he realized that he had some pain and weakness in the legs with walking. No injuries or falls. CT myelogram showed severe spinal stenosis L3-L4.   Past Medical History:  Diagnosis Date  . Arthritis   . Carcinoma of prostate [facility])    prostate  . CKD (chronic kidney disease) stage 2, GFR 60-89 ml/min   . Coronary artery disease    a. LHC (8/14):  3v CAD => s/p CABG (LIMA-LAD, SVG-OM1, SVG-PDA)  . First degree AV block 01/28/2019   Noted on EKG  . GERD (gastroesophageal reflux disease)   . Gout   . HTN (hypertension)   . Hx of echocardiogram    Echocardiogram 11/13/12: EF 55-60%, normal wall motion, biatrial enlargement, mild AI, mild TR, no pericardial effusion  . Moderate aortic stenosis 12/06/2017   Noted on ECHO  . 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 Sleep apnea    Questionable  . Spinal stenosis     Past Surgical History:  Procedure Laterality Date  . CARDIAC CATHETERIZATION  11/07/2012   Dr Acie Fredrickson  . CATARACT EXTRACTION W/ INTRAOCULAR LENS IMPLANT Bilateral   . CORONARY ARTERY BYPASS GRAFT N/A 11/11/2012   Procedure: CORONARY ARTERY BYPASS GRAFTING times four using Right Greater Saphenous Vein Graft harvested endoscopically and Left Internal Mammary Artery.;  Surgeon: Ivin Poot, MD;  Location: [place];  Service: Open Heart Surgery;  Laterality: N/A;  . INGUINAL HERNIA REPAIR Right 01/10/2017   Procedure: OPEN RIGHT HERNIA REPAIR INGUINAL;  Surgeon: Ileana Roup, MD;  Location[facility];  Service: General;  Laterality: Right;  . INSERTION OF MESH Right 01/10/2017   Procedure: INSERTION OF MESH;  Surgeon: Ileana Roup, MD;  Location: [facility];  Service: General;  Laterality:  Right;  . INTRAOPERATIVE TRANSESOPHAGEAL ECHOCARDIOGRAM N/A 11/11/2012   Procedure: INTRAOPERATIVE TRANSESOPHAGEAL ECHOCARDIOGRAM;  Surgeon: Ivin Poot, MD;  Location: [place];  Service: Open Heart Surgery;  Laterality: N/A;  . LEFT HEART CATHETERIZATION WITH CORONARY ANGIOGRAM N/A 11/07/2012   Procedure: LEFT HEART CATHETERIZATION WITH CORONARY ANGIOGRAM;  Surgeon: Thayer Headings, MD;  Location: [facility] CATH LAB;  Service: Cardiovascular;  Laterality: N/A;  . LYMPH NODE DISSECTION     Bilateral pelvic  . RETROPUBIC PROSTATECTOMY     Radical    Family History  Problem Relation Age of Onset  . Aneurysm Father        Cerebral  . Stroke Father   . Hypertension Father   . Cancer Mother        colon  . Parkinson's disease Son    Social History:  reports that he has never smoked. He has never used smokeless tobacco. He reports current alcohol use. He reports that he does not use drugs.  Allergies:  Allergies  Allergen Reactions  . Crestor [Rosuvastatin]     GYNECOMASTIA AND SWELLING        Results for orders placed or performed during the hospital encounter of 02/04/19 (from the past 48 hour(s))  Type and screen Order type and screen if day of surgery is less than 15 days from draw of preadmission visit or order morning of  surgery if day of surgery is greater than 6 days from preadmission visit.     Status: None   Collection Time: 02/04/19  1:15 PM  Result Value Ref Range   ABO/RH(D) O POS    Antibody Screen NEG    Sample Expiration 02/18/2019,2359    Extend sample reason      NO TRANSFUSIONS OR PREGNANCY IN THE PAST 3 MONTHS Performed at [facility], [place] [address], [place], [place] [address]   ABO/Rh     Status: None   Collection Time: 02/04/19  1:15 PM  Result Value Ref Range   ABO/RH(D)      Jenetta Downer POS Performed at [facility], [place] [address], [place], [place] [address]   APTT     Status: Abnormal   Collection Time: 02/04/19  2:00 PM   Result Value Ref Range   aPTT 55 (H) 24 - 36 seconds    Comment:        IF BASELINE aPTT IS ELEVATED, SUGGEST PATIENT RISK ASSESSMENT BE USED TO DETERMINE APPROPRIATE ANTICOAGULANT THERAPY. Performed at [facility], [place] [address] [place], [place] [address]   CBC WITH DIFFERENTIAL     Status: Abnormal   Collection Time: 02/04/19  2:00 PM  Result Value Ref Range   WBC 7.5 4.0 - 10.5 K/uL   RBC 3.61 (L) 4.22 - 5.81 MIL/uL   Hemoglobin 11.5 (L) 13.0 - 17.0 g/dL   HCT 37.1 (L) 39.0 - 52.0 %   MCV 102.8 (H) 80.0 - 100.0 fL   MCH 31.9 26.0 - 34.0 pg   MCHC 31.0 30.0 - 36.0 g/dL   RDW 13.6 11.5 - 15.5 %   Platelets 249 150 - 400 K/uL   nRBC 0.0 0.0 - 0.2 %   Neutrophils Relative % 64 %   Neutro Abs 4.8 1.7 - 7.7 K/uL   Lymphocytes Relative 26 %   Lymphs Abs 1.9 0.7 - 4.0 K/uL   Monocytes Relative 8 %   Monocytes Absolute 0.6 0.1 - 1.0 K/uL   Eosinophils Relative 1 %   Eosinophils Absolute 0.1 0.0 - 0.5 K/uL   Basophils Relative 1 %   Basophils Absolute 0.1 0.0 - 0.1 K/uL   Immature Granulocytes 0 %   Abs Immature Granulocytes 0.02 0.00 - 0.07 K/uL    Comment: Performed at [facility], [place] [address], [place], [place] [address]  Comprehensive metabolic panel     Status: Abnormal   Collection Time: 02/04/19  2:00 PM  Result Value Ref Range   Sodium 140 135 - 145 mmol/L   Potassium 4.1 3.5 - 5.1 mmol/L   Chloride 105 98 - 111 mmol/L   CO2 26 22 - 32 mmol/L   Glucose, Bld 107 (H) 70 - 99 mg/dL   BUN 21 8 - 23 mg/dL   Creatinine, Ser 1.36 (H) 0.61 - 1.24 mg/dL   Calcium 9.4 8.9 - 10.3 mg/dL   Total Protein 7.1 6.5 - 8.1 g/dL   Albumin 4.5 3.5 - 5.0 g/dL   AST 27 15 - 41 U/L   ALT 18 0 - 44 U/L   Alkaline Phosphatase 59 38 - 126 U/L   Total Bilirubin 1.3 (H) 0.3 - 1.2 mg/dL   GFR calc non Af Amer 46 (L) >60 mL/min   GFR calc Af Amer 53 (L) >60 mL/min   Anion gap 9 5 - 15    Comment: Performed at [facility], [address] [place], [place] [address]  Protime-INR     Status: None   Collection Time: 02/04/19  2:00 PM  Result Value Ref Range   Prothrombin Time 13.4 11.4 - 15.2 seconds   INR 1.0 0.8 - 1.2    Comment: (NOTE) INR goal varies based on device and disease states. Performed at [facility], [place] [address], [place], [place] [address]    Dg Lumbar Spine 2-3 Views  Result Date: 02/05/2019 CLINICAL DATA:  Preop. Please number vertebra. EXAM: LUMBAR SPINE - 2-3 VIEW COMPARISON:  Lumbar spine CT 01/16/2019 FINDINGS: Five non-rib-bearing lumbar vertebra, vertebra numbered. L3 inferior endplate fracture is unchanged from recent CT lumbar spine. The remaining vertebral body heights are preserved. Diffuse degenerative disc disease and facet arthropathy. Grade 1 anterolisthesis of L4 on L5 is unchanged. Grade 1 retrolisthesis of L5 on S1 is unchanged. IMPRESSION: 1. Unchanged appearance of L3 inferior endplate fracture. 2. Unchanged alignment with diffuse degenerative disc disease and facet arthropathy. 3. Five non-rib-bearing lumbar vertebra, vertebra were numbered on the images. Electronically Signed   By: Keith Rake M.D.   On: 02/05/2019 00:39    Review of Systems  Constitutional: Negative.   HENT: Negative.   Eyes: Negative.   Respiratory: Negative.   Cardiovascular: Negative.   Gastrointestinal: Negative.   Genitourinary: Negative.   Musculoskeletal: Positive for back pain, joint pain and myalgias. Negative for falls and neck pain.  Skin: Negative.   Neurological: Positive for tingling, sensory change and weakness. Negative for dizziness, tremors, speech change, focal weakness, seizures, loss of consciousness and headaches.  Endo/Heme/Allergies: Negative.   Psychiatric/Behavioral: Negative.    Ht: 6 ft  Wt: 160 lbs BMI: 21.7 BP: 135/75 sitting L arm  Pulse: 68 bpm   Physical Exam  Constitutional: He is oriented to person, place, and time. He appears  well-developed and well-nourished. No distress.  HENT:  Head: Normocephalic and atraumatic.  Right Ear: External ear normal.  Left Ear: External ear normal.  Nose: Nose normal.  Mouth/Throat: Oropharynx is clear and moist.  Eyes: Conjunctivae and EOM are normal.  Neck: Normal range of motion. Neck supple.  Cardiovascular: Normal rate, regular rhythm and intact distal pulses.  Murmur heard. Respiratory: Effort normal and breath sounds normal. No respiratory distress. He has no wheezes.  GI: Soft. Bowel sounds are normal.  Musculoskeletal:     Right hip: Normal.     Left hip: Normal.     Right knee: Normal.     Left knee: Normal.     Lumbar back: He exhibits decreased range of motion, pain and spasm.     Comments: Motion of the lumbar spine causes radiation of pain into the legs  Neurological: He is alert and oriented to person, place, and time. No sensory deficit.  3/5 weakness in dorsiflexors of both feet  Skin: No rash noted. He is not diaphoretic. No erythema.  Psychiatric: He has a normal mood and affect. His behavior is normal.     Assessment/Plan Lumbar spinal stenosis L3-L4 He needs a lumbar decompressive laminectomy at L3-L4. Risks and benefits of the procedure discussed with the patient by Dr. Gladstone Lighter. Will likely need to stay overnight for observation.   H&P performed by Dr. Latanya Maudlin  Loann Quill, PA-C 02/05/2019, 9:47 AM

## 2019-02-05 NOTE — Op Note (Signed)
NAME: Jon Reid, [facility] MEDICAL RECORD [id number] ACCOUNT [id number] DATE OF BIRTH:[birth year] FACILITY[facility] LOCATION: [facility] PHYSICIAN:Shantia Sanford Fransico Setters, MD  OPERATIVE REPORT  DATE OF PROCEDURE:  02/05/2019  SURGEON:  Latanya Maudlin, MD  ASSISTANT:  Ardeen Jourdain PA.  PREOPERATIVE DIAGNOSES: 1.  Severe spinal stenosis at L3-L4. 2.  Complete spinal block at L3-L4 secondary to severe stenosis.  This was shown on the myelogram. 3.  Severe foraminal stenosis involving the L3 and the L4 root bilaterally. 4.  Partial foot drop on the left and on the right secondary to severe stenosis.  POSTOPERATIVE DIAGNOSES: 1.  Severe spinal stenosis at L3-L4. 2.  Complete spinal block at L3-L4 secondary to severe stenosis.  This was shown on the myelogram. 3.  Severe foraminal stenosis involving the L3 and the L4 root bilaterally. 4.  Partial foot drop on the left and on the right secondary to severe stenosis.  OPERATION: 1.  Complete decompressive lumbar laminectomy at L3-L4. 2.  Foraminotomy for the L3 root on the left. 3.  Foraminotomy for the L4 root on the left. 4.  Foraminotomy for the L3 root on the right. 5.  Foraminotomy for the L5 root on the right.  We did bilateral foraminotomies for the 2 nerve root.  DESCRIPTION OF PROCEDURE:  Under general anesthesia with the patient on the [place] frame, a routine orthopedic prep and draping of the lower back was carried out.  The patient had 2 grams of IV Ancef.  The appropriate time-out was first carried out.  I  did not mark the back only because we went central.  He did not need to be marked right or left.  At this time, after the timeout and sterile prep, 2 needles were placed in the back for localization purposes.  An x-ray was taken.  Following that, the 2  needles were removed.  I then injected 20 mL of 0.25% Marcaine with epinephrine on both sides of the L3-L4 spinous process and proximally and distally to control bleeding.  After this,  an incision was made in the midline over the L3-L4, L4-L5.  I  extended the incision distally and proximally.  The self-retaining retractors were inserted.  I then cauterized the bleeders.  At this time, the muscle was stripped from the lamina and spinous processes bilaterally at this level.  Once we had good  exposure, another x-ray was taken to verify the position.  Following that, the [facility] retractors were inserted.  I removed the spinous process of L3 and did a complete decompression of the lumbar laminectomy at L3-L4.  Note, this was an extremely  difficult and tedious case mainly because of the overlap of the lamina from below and above.  A great deal of time was taken.  The microscope was brought in.  Once we had an opening to get in down to the dura, we were able to easily then dissected the  bone and ligamentum flavum off the dura sac.  The dura was protected with cottonoid.  I utilized the hockey stick to depress the dura while we were doing our lateral recess decompressions.  Once this was completed, we did foraminotomies bilaterally at  both levels.  I now was able to easily pass the hockey stick distally and proximally.  Note, we continued the dissection distally and proximally until there was no further block.  The foramina and nerve roots now were free.  We thoroughly irrigated out  the area.  We had good hemostasis.  I then loosely  applied some thrombin-soaked Gelfoam and closed the wound in layers in the usual fashion except I left a small distal deep and proximal part of the wound open for drainage purposes in case there was any  bleeding it would not compress the sac.  The remaining part of the wound was closed in the usual fashion.  Metal staples were used for the skin.  Sterile dressings were applied.  PLAN:  I will keep him overnight for pain control and then discharge him.  TN/NUANCE  D:02/05/2019 T:02/05/2019 JOB:[id number]

## 2019-02-05 NOTE — Interval H&P Note (Signed)
History and Physical Interval Note:  02/05/2019 2:38 PM  Jon Reid  has presented today for surgery, with the diagnosis of spinal stenosis L3-L4 complete block.  The various methods of treatment have been discussed with the patient and family. After consideration of risks, benefits and other options for treatment, the patient has consented to  Procedure(s) with comments: LUMBAR LAMINECTOMY/DECOMPRESSION L3-L4 (N/A) - 27min as a surgical intervention.  The patient's history has been reviewed, patient examined, no change in status, stable for surgery.  I have reviewed the patient's chart and labs.  Questions were answered to the patient's satisfaction.     Latanya Maudlin

## 2019-02-05 NOTE — Brief Op Note (Signed)
02/05/2019  4:41 PM  PATIENT:  Jon Reid  85 y.o. male  PRE-OPERATIVE DIAGNOSIS:  spinal stenosis L3-L4 complete block and foraminal Stenosis involving the L-3 and L-4 nerve roots bilaterally.  POST-OPERATIVE DIAGNOSIS:  Same as Pre-Op  PROCEDURE:  Procedure(s) with comments: LUMBAR LAMINECTOMY/DECOMPRESSION L3-L4 (N/A) - 109minfor severe Spinal Stenosis and a Complete Block.Foraminotomies for the L-3 and L-4 nerve roots bilaterally.  SURGEON:  Surgeon(s) and Role:    * Latanya Maudlin, MD - Primary  PHYSICIAN ASSISTANT:Amber [place] PA   ASSISTANTS: Ardeen Jourdain PA   ANESTHESIA:   general  EBL:  100cc  BLOOD ADMINISTERED:none  DRAINS: none   LOCAL MEDICATIONS USED:  OTHER 20cc of Exparel at the end of the case.20cc of 0,25% Marcaine with Epinephrine at the start of the case.  SPECIMEN:  No Specimen  DISPOSITION OF SPECIMEN:  N/A  COUNTS:  YES  TOURNIQUET:  * No tourniquets in log *  DICTATION: .Other Dictation: Dictation Number [id number]  PLAN OF CARE: Admit for overnight observation  PATIENT DISPOSITION:  PACU - hemodynamically stable.   Delay start of Pharmacological VTE agent (>24hrs) due to surgical blood loss or risk of bleeding: yes

## 2019-02-05 NOTE — Progress Notes (Signed)
Pt primary care physician aware of weight change- loss of 20 lbs.

## 2019-02-05 NOTE — Anesthesia Postprocedure Evaluation (Signed)
Anesthesia Post Note  Patient: Jon Reid  Procedure(s) Performed: LUMBAR LAMINECTOMY/DECOMPRESSION L3-L4 (N/A Back)     Patient location during evaluation: PACU Anesthesia Type: General Level of consciousness: awake and alert Pain management: pain level controlled Vital Signs Assessment: post-procedure vital signs reviewed and stable Respiratory status: spontaneous breathing, nonlabored ventilation, respiratory function stable and patient connected to nasal cannula oxygen Cardiovascular status: blood pressure returned to baseline and stable Postop Assessment: no apparent nausea or vomiting Anesthetic complications: no    Last Vitals:  Vitals:   02/05/19 1745 02/05/19 1855  BP: (!) 153/63 126/80  Pulse: 63 66  Resp: 14 16  Temp: 37 C (!) 36.4 C  SpO2: 100% 100%    Last Pain:  Vitals:   02/05/19 1823  TempSrc:   PainSc: 0-No pain                 Effie Berkshire

## 2019-02-05 NOTE — Transfer of Care (Signed)
Immediate Anesthesia Transfer of Care Note  Patient: [name]  Procedure(s) Performed: LUMBAR LAMINECTOMY/DECOMPRESSION L3-L4 (N/A Back)  Patient Location: PACU  Anesthesia Type:General  Level of Consciousness: awake, alert  and oriented  Airway & Oxygen Therapy: Patient Spontanous Breathing and Patient connected to face mask oxygen  Post-op Assessment: Report given to RN and Post -op Vital signs reviewed and stable  Post vital signs: Reviewed and stable  Last Vitals:  Vitals Value Taken Time  BP 148/62 02/05/19 1655  Temp    Pulse    Resp 17 02/05/19 1658  SpO2    Vitals shown include unvalidated device data.  Last Pain:  Vitals:   02/05/19 1304  TempSrc: Oral      Patients Stated Pain Goal: 3 ([id number])  Complications: No apparent anesthesia complications

## 2019-02-05 NOTE — Plan of Care (Signed)
PATIENT IS FREE OF PAIN AT THIS TIME- DOES NOT C/O PAIN OR DISCOMFORT- WILL CONTINUE TO REASSESS.

## 2019-02-05 NOTE — Discharge Instructions (Addendum)
For the first three days, remove your dressing, and tape a piece of saran wrap over your incision Take your shower, then remove the saran wrap and put a clean dressing on, then reapply your sling. After three days you can shower without the saran wrap.  No lifting or excessive bending No driving while taking pain medications.  Call Dr. Gladstone Lighter if any wound complications or temperature of 101 degrees F or over.  Call the office for an appointment to see Dr. Gladstone Lighter in one week: [phone] and ask for Dr. Charlestine Night medical assistant, Brunilda Payor.

## 2019-02-05 NOTE — Anesthesia Procedure Notes (Signed)
Procedure Name: Intubation Date/Time: 02/05/2019 2:49 PM Performed by: Katelyn Broadnax D, CRNA Pre-anesthesia Checklist: Patient identified, Emergency Drugs available, Suction available and Patient being monitored Patient Re-evaluated:Patient Re-evaluated prior to induction Oxygen Delivery Method: Circle system utilized Preoxygenation: Pre-oxygenation with 100% oxygen Induction Type: IV induction Ventilation: Mask ventilation without difficulty Laryngoscope Size: Mac and 3 Grade View: Grade I Tube type: Oral Tube size: 7.5 mm Number of attempts: 1 Airway Equipment and Method: Stylet Placement Confirmation: ETT inserted through vocal cords under direct vision,  positive ETCO2 and breath sounds checked- equal and bilateral Secured at: 22 cm Tube secured with: Tape Dental Injury: Teeth and Oropharynx as per pre-operative assessment

## 2019-02-06 ENCOUNTER — Encounter (HOSPITAL_COMMUNITY): Payer: Self-pay | Admitting: Orthopedic Surgery

## 2019-02-06 MED ORDER — METHOCARBAMOL 500 MG PO TABS: 500.0000 mg | ORAL_TABLET | Freq: Four times a day (QID) | ORAL | 0 refills | Status: DC | PRN

## 2019-02-06 MED ORDER — TAMSULOSIN HCL 0.4 MG PO CAPS
0.4000 mg | ORAL_CAPSULE | Freq: Once | ORAL | Status: AC
  Administered 2019-02-06: 0.4 mg via ORAL
  Filled 2019-02-06: qty 1

## 2019-02-06 MED ORDER — ORAL CARE MOUTH RINSE
15.0000 mL | Freq: Two times a day (BID) | OROMUCOSAL | Status: DC
  Administered 2019-02-06 – 2019-02-10 (×7): 15 mL via OROMUCOSAL

## 2019-02-06 MED ORDER — HYDROCODONE-ACETAMINOPHEN 5-325 MG PO TABS: 1.0000 | ORAL_TABLET | ORAL | 0 refills | Status: DC | PRN

## 2019-02-06 MED ORDER — ADULT MULTIVITAMIN W/MINERALS CH
1.0000 | ORAL_TABLET | Freq: Every day | ORAL | Status: DC
  Administered 2019-02-06 – 2019-02-10 (×5): 1 via ORAL
  Filled 2019-02-06 (×4): qty 1

## 2019-02-06 MED ORDER — METOCLOPRAMIDE HCL 5 MG/ML IJ SOLN
10.0000 mg | Freq: Four times a day (QID) | INTRAMUSCULAR | Status: DC
  Administered 2019-02-06 – 2019-02-09 (×11): 10 mg via INTRAVENOUS
  Filled 2019-02-06 (×12): qty 2

## 2019-02-06 MED ORDER — SODIUM CHLORIDE 0.9 % IV BOLUS
250.0000 mL | Freq: Once | INTRAVENOUS | Status: AC
  Administered 2019-02-06: 250 mL via INTRAVENOUS

## 2019-02-06 NOTE — Progress Notes (Signed)
Initial Nutrition Assessment  RD working remotely.  DOCUMENTATION CODES:   Not applicable  INTERVENTION:  - continue Ensure Enlive BID, each supplement provides 350 kcal and 20 grams of protein. - will order daily multivitamin with minerals. - continue to encourage PO intakes.    NUTRITION DIAGNOSIS:   Increased nutrient needs related to acute illness as evidenced by estimated needs.  GOAL:   Patient will meet greater than or equal to 90% of their needs  MONITOR:   PO intake, Supplement acceptance, Labs, Weight trends, Skin  REASON FOR ASSESSMENT:   Malnutrition Screening Tool  ASSESSMENT:   85 y.o. male who presented to the ED due to low back pain. CT myelogram showed severe spinal stenosis to L3-L4.  Patient is POD #1 lumbar laminectomy/decompression L3-L4. Diet advanced from NPO to Regular yesterday evening after the procedure and patient consumed 50% of dinner. Ensure Enlive was ordered BID and he accepted the bottle offered to him this AM. A discharge order to home has been entered this AM; no discharge summary yet.   Per chart review, current weight is 160 lb, weight on 9/24 was 162 lb, and weight on 8/6 was 164 lb. This indicates 4 lb weight loss (2.4% body weight) in the past 3.5 months; not significant for time frame.   Labs reviewed; creatinine: 1.36 mg/dl, GFR: 46 ml/min. Medications reviewed; 40 mg oral lasix/day. IVF; LR @ 100 ml/hr.    NUTRITION - FOCUSED PHYSICAL EXAM:  unable to complete at this time.   Diet Order:   Diet Order            Diet - low sodium heart healthy        Diet regular Room service appropriate? Yes; Fluid consistency: Thin  Diet effective now              EDUCATION NEEDS:   No education needs have been identified at this time  Skin:  Skin Assessment: Skin Integrity Issues: Skin Integrity Issues:: Incisions Incisions: back (11/19)  Last BM:  11/17  Height:   Ht Readings from Last 1 Encounters:  02/05/19 5\' 11"   (1.803 m)    Weight:   Wt Readings from Last 1 Encounters:  02/05/19 72.4 kg    Ideal Body Weight:  78.2 kg  BMI:  Body mass index is 22.27 kg/m.  Estimated Nutritional Needs:   Kcal:  1700-1900 kcal  Protein:  75-90 grams  Fluid:  >/= 2 L/day      Jon Matin, MS, RD, LDN, [facility] Inpatient Clinical Dietitian Pager # [phone] After hours/weekend pager # [phone]

## 2019-02-06 NOTE — Progress Notes (Signed)
Patient's urine output has been monitored during this shift. Bladder scan done at the 2 am hour with 251 cc of urine. At the 4 am hour, another bladder scan was done with 351 cc of urine. Called [facility] to express concerns about patient not voiding. Per PA order Margarita Mail [place], [place]), an in and out cath was done. 325 cc of urine came out. Will continue to monitor patient's urine output.

## 2019-02-06 NOTE — Progress Notes (Signed)
Physical Therapy Treatment Patient Details Name: Jon Reid MRN: [id number] DOB: 16-Apr-1928 Today's Date: 02/06/2019    History of Present Illness Pt is an 85 year old male s/p LUMBAR LAMINECTOMY/DECOMPRESSION L3-L4 due to spinal stenosis with PMHx of CABG x4, CAD, right BBB, HTN    PT Comments    Pt reports feeling a little better this afternoon.  Pt assisted with ambulating in hallway and denies dizziness.  Pt requiring assist for bed mobility and sit to stands at this time.  Pt also would benefit from continued review of back precautions especially with mobility.    Follow Up Recommendations  Follow surgeon's recommendation for DC plan and follow-up therapies     Equipment Recommendations  Rolling walker with 5" wheels    Recommendations for Other Services       Precautions / Restrictions Precautions Precautions: Fall;Back Precaution Comments: reviewed back precautions Restrictions Weight Bearing Restrictions: No    Mobility  Bed Mobility Overal bed mobility: Needs Assistance Bed Mobility: Rolling;Sidelying to Sit Rolling: Min assist Sidelying to sit: Mod assist     Sit to sidelying: Min assist General bed mobility comments: min assist more manual cues for log roll technique, assist for LEs over EOB and onto bed  Transfers Overall transfer level: Needs assistance Equipment used: Rolling walker (2 wheeled) Transfers: Sit to/from Stand Sit to Stand: Min assist Stand pivot transfers: Min assist       General transfer comment: verbal cues for maintaining back precautions, assist to rise and steady, cues for hand placement  Ambulation/Gait Ambulation/Gait assistance: Min guard Gait Distance (Feet): 160 Feet Assistive device: Rolling walker (2 wheeled) Gait Pattern/deviations: Step-through pattern;Decreased stride length;Trunk flexed     General Gait Details: verbal cues for RW positioning, forward flexed position upper spine with rounded shoulders  (possibly baseline), denies dizziness   Stairs             Wheelchair Mobility    Modified Rankin (Stroke Patients Only)       Balance                                            Cognition Arousal/Alertness: Awake/alert Behavior During Therapy: WFL for tasks assessed/performed Overall Cognitive Status: Within Functional Limits for tasks assessed                                 General Comments: very HOH; follows commands; home info different:  PT/OT      Exercises      General Comments        Pertinent Vitals/Pain Pain Assessment: Faces Faces Pain Scale: Hurts a little bit Pain Location: back Pain Descriptors / Indicators: Discomfort;Sore Pain Intervention(s): Repositioned;Monitored during session    Home Living Family/patient expects to be discharged to:: Private residence               Additional Comments: Pt told me he lives with wife and daughter is nearby; per PT son will assist    Prior Function            PT Goals (current goals can now be found in the care plan section) Acute Rehab PT Goals Patient Stated Goal: none stated PT Goal Formulation: With patient Time For Goal Achievement: 02/10/19 Potential to Achieve Goals: Good Progress towards PT goals: Progressing toward goals  Frequency    Min 5X/week      PT Plan Current plan remains appropriate    Co-evaluation              AM-PAC PT "6 Clicks" Mobility   Outcome Measure  Help needed turning from your back to your side while in a flat bed without using bedrails?: A Little Help needed moving from lying on your back to sitting on the side of a flat bed without using bedrails?: A Little Help needed moving to and from a bed to a chair (including a wheelchair)?: A Little Help needed standing up from a chair using your arms (e.g., wheelchair or bedside chair)?: A Little Help needed to walk in hospital room?: A Little Help needed climbing  3-5 steps with a railing? : A Lot 6 Click Score: 17    End of Session Equipment Utilized During Treatment: Gait belt Activity Tolerance: Patient tolerated treatment well Patient left: in bed;with call bell/phone within reach;with bed alarm set Nurse Communication: Mobility status PT Visit Diagnosis: Difficulty in walking, not elsewhere classified (R26.2)     Time: [id number] PT Time Calculation (min) (ACUTE ONLY): 13 min  Charges:  $Gait Training: 8-22 mins                    Carmelia Bake, PT, DPT Acute Rehabilitation Services Office: [phone] Pager: [place] 02/06/2019, 3:57 PM

## 2019-02-06 NOTE — Plan of Care (Signed)
  Problem: Education: Goal: Ability to verbalize activity precautions or restrictions will improve Outcome: Progressing Goal: Knowledge of the prescribed therapeutic regimen will improve Outcome: Progressing Goal: Understanding of discharge needs will improve Outcome: Progressing   Problem: Activity: Goal: Ability to tolerate increased activity will improve Outcome: Progressing

## 2019-02-06 NOTE — Evaluation (Signed)
Occupational Therapy Evaluation Patient Details Name: Jon Reid MRN: [id number] DOB: [birth year] Today's Date: 02/06/2019    History of Present Illness Pt is an 85 year old male s/p LUMBAR LAMINECTOMY/DECOMPRESSION L3-L4 due to spinal stenosis with PMHx of CABG x4, CAD, right BBB, HTN   Clinical Impression   Pt was admitted for the above. Unsure of family support:  He told OT/PT different things.  He was limited by BP at time of eval.  Reviewed precautions and alternative methods of safely performing ADLs. Will follow in acute setting with min guard level goals    Follow Up Recommendations  Supervision/Assistance - 24 hour    Equipment Recommendations  (has a high commode)    Recommendations for Other Services       Precautions / Restrictions Precautions Precautions: Fall;Back Precaution Comments: reviewed back precautions Restrictions Weight Bearing Restrictions: No      Mobility Bed Mobility Overal bed mobility: Needs Assistance Bed Mobility: Rolling;Sidelying to Sit Rolling: Min assist Sidelying to sit: Mod assist     Sit to sidelying: Min assist;+2 for safety/equipment General bed mobility comments: RN guided trunk and OT assisted with legs; cues for sequence Back to bed by OT Transfers Overall transfer level: Needs assistance Equipment used: 1 person hand held assist Transfers: Sit to/from [facility] Sit to Stand: Min assist Stand pivot transfers: Min assist       General transfer comment: did not use RW as BP was low.  PT placed hands on therapists arms    Balance                                           ADL either performed or assessed with clinical judgement   ADL Overall ADL's : Needs assistance/impaired Eating/Feeding: Independent   Grooming: Supervision/safety   Upper Body Bathing: Supervision/ safety   Lower Body Bathing: Maximal assistance   Upper Body Dressing : Minimal assistance   Lower Body  Dressing: Maximal assistance   Toilet Transfer: Minimal assistance;Stand-pivot(to bed)   Toileting- Clothing Manipulation and Hygiene: Minimal assistance         General ADL Comments: BP low up in chair. Assisted back to bed and continued education.  Pt states wife can assist with adls.  They have a reacher at home. Reviewed back precautions. Will need reinforcement     Vision         Perception     Praxis      Pertinent Vitals/Pain Pain Assessment: Faces Faces Pain Scale: Hurts a little bit Pain Location: back Pain Descriptors / Indicators: Discomfort;Sore Pain Intervention(s): Limited activity within patient's tolerance;Monitored during session;Repositioned     Hand Dominance     Extremity/Trunk Assessment Upper Extremity Assessment Upper Extremity Assessment: Generalized weakness   Lower Extremity Assessment Lower Extremity Assessment: Generalized weakness(able to perform DF and PF bilaterally equally)       Communication Communication Communication: HOH   Cognition Arousal/Alertness: Awake/alert Behavior During Therapy: WFL for tasks assessed/performed Overall Cognitive Status: Within Functional Limits for tasks assessed                                 General Comments: very HOH; follows commands; home info different:  PT/OT   General Comments       Exercises     Shoulder Instructions  Home Living Family/patient expects to be discharged to:: Private residence Living Arrangements: Children(son)   Type of Home: House       Home Layout: Able to live on main level with bedroom/bathroom;Two level Alternate Level Stairs-Number of Steps: flight   Bathroom Shower/Tub: Teacher, early years/pre: Handicapped height     Home Equipment: None   Additional Comments: Pt told me he lives with wife and daughter is nearby; per PT son will assist      Prior Functioning/Environment Level of Independence: Independent                  OT Problem List: Decreased strength;Decreased activity tolerance;Cardiopulmonary status limiting activity;Decreased knowledge of use of DME or AE;Decreased knowledge of precautions;Impaired balance (sitting and/or standing);Pain      OT Treatment/Interventions: Self-care/ADL training;DME and/or AE instruction;Patient/family education;Balance training;Therapeutic activities    OT Goals(Current goals can be found in the care plan section) Acute Rehab OT Goals Patient Stated Goal: none stated OT Goal Formulation: With patient Time For Goal Achievement: 02/13/19 Potential to Achieve Goals: Good ADL Goals Pt Will Transfer to Toilet: with min guard assist;ambulating(high commode vs 3;1 over high commode) Additional ADL Goal #1: pt will recall 3/3 back precautions Additional ADL Goal #2: pt will use AE for LB adls or verbalize what family needs to do to assist him Additional ADL Goal #3: pt will perform bed mob at min guard level in prep for adls  OT Frequency: Min 2X/week   Barriers to D/C:            Co-evaluation              AM-PAC OT "6 Clicks" Daily Activity     Outcome Measure Help from another person eating meals?: None Help from another person taking care of personal grooming?: A Little Help from another person toileting, which includes using toliet, bedpan, or urinal?: A Little Help from another person bathing (including washing, rinsing, drying)?: A Lot Help from another person to put on and taking off regular upper body clothing?: A Little Help from another person to put on and taking off regular lower body clothing?: A Lot 6 Click Score: 17   End of Session    Activity Tolerance: (BP) Patient left: in bed;with call bell/phone within reach;with bed alarm set  OT Visit Diagnosis: Muscle weakness (generalized) (M62.81);Unsteadiness on feet (R26.81)                Time: 1245-1300 OT Time Calculation (min): 15 min Charges:  OT General Charges $OT Visit: 1  Visit OT Evaluation $OT Eval Low Complexity: [place], OTR/L Acute Rehabilitation Services [phone] [facility] pager [phone] office 02/06/2019  [place] 02/06/2019, 2:08 PM

## 2019-02-06 NOTE — Progress Notes (Signed)
Subjective: 1 Day Post-Op Procedure(s) (LRB): LUMBAR LAMINECTOMY/DECOMPRESSION L3-L4 (N/A) Patient reports pain as 1 on 0-10 scale.Some urinary retention. Will do Bladder scan. He has not been up walking. B.P. is 94/52,will give a bolus.Good function in both feet.    Objective: Vital signs in last 24 hours: Temp:  [97.5 F (36.4 C)-99 F (37.2 C)] 99 F (37.2 C) (11/20 0919) Pulse Rate:  [58-79] 70 (11/20 0919) Resp:  [12-19] 16 (11/20 0919) BP: (94-159)/(52-84) 94/52 (11/20 0919) SpO2:  [96 %-100 %] 96 % (11/20 0919) Weight:  [72.4 kg] 72.4 kg (11/19 1306)  Intake/Output from previous day: 11/19 0701 - 11/20 0700 In: 3220 [P.O.:320; I.V.:2800; IV Piggyback:100] Out: 375 [Urine:375] Intake/Output this shift: No intake/output data recorded.  Recent Labs    02/04/19 1400  HGB 11.5*   Recent Labs    02/04/19 1400  WBC 7.5  RBC 3.61*  HCT 37.1*  PLT 249   Recent Labs    02/04/19 1400  NA 140  K 4.1  CL 105  CO2 26  BUN 21  CREATININE 1.36*  GLUCOSE 107*  CALCIUM 9.4   Recent Labs    02/04/19 1400  INR 1.0    Neurologically intact   Assessment/Plan: 1 Day Post-Op Procedure(s) (LRB): LUMBAR LAMINECTOMY/DECOMPRESSION L3-L4 (N/A) Up with therapy. Will DC after PT. Will give Flomax at this time.      Latanya Maudlin 02/06/2019, 9:20 AM

## 2019-02-06 NOTE — Evaluation (Signed)
Physical Therapy Evaluation Patient Details Name: Jon Reid MRN: [id number] DOB: 1929-02-08 Today's Date: 02/06/2019   History of Present Illness  Pt is an 85 year old male s/p LUMBAR LAMINECTOMY/DECOMPRESSION L3-L4 due to spinal stenosis with PMHx of CABG x4, CAD, right BBB, HTN  Clinical Impression  Patient is s/p above surgery resulting in the deficits listed below (see PT Problem List).  Patient will benefit from skilled PT to increase their independence and safety with mobility (while adhering to their precautions) to allow discharge to the venue listed below.  Pt limited by nausea and emesis upon sitting EOB.  Pt assisted to recliner to sit upright and RN brought nausea meds.  Initiated back precautions however pt not feeling well and needs further teaching.     Follow Up Recommendations Follow surgeon's recommendation for DC plan and follow-up therapies    Equipment Recommendations  Rolling walker with 5" wheels    Recommendations for Other Services       Precautions / Restrictions Precautions Precautions: Fall;Back Precaution Comments: provided back precautions handout      Mobility  Bed Mobility Overal bed mobility: Needs Assistance Bed Mobility: Rolling;Sidelying to Sit Rolling: Min assist Sidelying to sit: Mod assist       General bed mobility comments: min assist more manual cues for log roll technique, assist for LEs over EOB; once sitting EOB pt with nausea and emesis of black small coffee grind material (RN brought to room)  Transfers Overall transfer level: Needs assistance Equipment used: Rolling walker (2 wheeled) Transfers: Sit to/from [facility] Sit to Stand: Min assist Stand pivot transfers: Min assist       General transfer comment: verbal cues for maintaining back precautions, assist to rise and steady, cues for hand placement  Ambulation/Gait             General Gait Details: pt felt unable at this time  Stairs             Wheelchair Mobility    Modified Rankin (Stroke Patients Only)       Balance                                             Pertinent Vitals/Pain Pain Assessment: Faces Faces Pain Scale: Hurts a little bit Pain Location: back Pain Descriptors / Indicators: Discomfort;Sore Pain Intervention(s): Repositioned;Monitored during session    [place] expects to be discharged to:: Private residence Living Arrangements: Children(son)   Type of Home: [place]: Able to live on main level with bedroom/bathroom;Two level Home Equipment: None      Prior Function Level of Independence: Independent               Hand Dominance        Extremity/Trunk Assessment        Lower Extremity Assessment Lower Extremity Assessment: Generalized weakness(able to perform DF and PF bilaterally equally)       Communication   Communication: HOH  Cognition Arousal/Alertness: Awake/alert Behavior During Therapy: WFL for tasks assessed/performed Overall Cognitive Status: Within Functional Limits for tasks assessed                                        General Comments  Exercises     Assessment/Plan    PT Assessment Patient needs continued PT services  PT Problem List Decreased strength;Decreased mobility;Decreased activity tolerance;Decreased balance;Decreased knowledge of use of DME;Pain;Decreased knowledge of precautions       PT Treatment Interventions DME instruction;Therapeutic activities;Gait training;Therapeutic exercise;Patient/family education;Stair training;Functional mobility training;Balance training    PT Goals (Current goals can be found in the Care Plan section)  Acute Rehab PT Goals PT Goal Formulation: With patient Time For Goal Achievement: 02/10/19 Potential to Achieve Goals: Good    Frequency Min 5X/week   Barriers to discharge        Co-evaluation                AM-PAC PT "6 Clicks" Mobility  Outcome Measure Help needed turning from your back to your side while in a flat bed without using bedrails?: A Little Help needed moving from lying on your back to sitting on the side of a flat bed without using bedrails?: A Little Help needed moving to and from a bed to a chair (including a wheelchair)?: A Little Help needed standing up from a chair using your arms (e.g., wheelchair or bedside chair)?: A Little Help needed to walk in hospital room?: A Lot Help needed climbing 3-5 steps with a railing? : A Lot 6 Click Score: 16    End of Session Equipment Utilized During Treatment: Gait belt Activity Tolerance: Other (comment)(limited by nausea and emesis) Patient left: in chair;with chair alarm set;with call bell/phone within reach;with nursing/sitter in room Nurse Communication: Mobility status PT Visit Diagnosis: Difficulty in walking, not elsewhere classified (R26.2)    Time: [id number] PT Time Calculation (min) (ACUTE ONLY): 22 min   Charges:   PT Evaluation $PT Eval Low Complexity: [place], PT, DPT Acute Rehabilitation Services Office: [phone] Pager: [phone]  Trena Platt 02/06/2019, 12:03 PM

## 2019-02-06 NOTE — Care Management (Signed)
Patient c/o nausea and dizziness this shift- dr. Gladstone Lighter aware and pa notified. Pt did vomit dark emesis when stood up for therapy, moderate amount ( vomit x2). Patient bladder scanned twice today and not candidate for in and out cath- residual 86 and 30 both times. Pt did void today without having to be cath today- output today greater than last night. Pt at 1800 says that he is "feeling much better". No nausea, discomfort or pain at this time. No c/o pain throughout the entire shift- amb pt in hallway with walker and in room, up to chair x3. Sleeping in bed at this time. Pt and family aware of discharge plan and complications from today.

## 2019-02-06 NOTE — Progress Notes (Signed)
OT Cancellation Note  Patient Details Name: Jon Reid MRN: [id number] DOB: 1928-05-25   Cancelled Treatment:    Reason Eval/Treat Not Completed: Medical issues which prohibited therapy.  Pt vomited with PT. Will check back later.   [name] 02/06/2019, 10:13 AM  Lesle Chris, OTR/L Acute Rehabilitation Services [phone] [facility] pager [phone] office 02/06/2019

## 2019-02-07 DIAGNOSIS — I452 Bifascicular block: Secondary | ICD-10-CM | POA: Diagnosis present

## 2019-02-07 DIAGNOSIS — M7989 Other specified soft tissue disorders: Secondary | ICD-10-CM | POA: Diagnosis not present

## 2019-02-07 DIAGNOSIS — R7989 Other specified abnormal findings of blood chemistry: Secondary | ICD-10-CM | POA: Diagnosis not present

## 2019-02-07 DIAGNOSIS — M48062 Spinal stenosis, lumbar region with neurogenic claudication: Secondary | ICD-10-CM | POA: Diagnosis present

## 2019-02-07 DIAGNOSIS — G4730 Sleep apnea, unspecified: Secondary | ICD-10-CM | POA: Diagnosis present

## 2019-02-07 DIAGNOSIS — Z79899 Other long term (current) drug therapy: Secondary | ICD-10-CM | POA: Diagnosis not present

## 2019-02-07 DIAGNOSIS — I248 Other forms of acute ischemic heart disease: Secondary | ICD-10-CM | POA: Diagnosis not present

## 2019-02-07 DIAGNOSIS — I4510 Unspecified right bundle-branch block: Secondary | ICD-10-CM | POA: Diagnosis present

## 2019-02-07 DIAGNOSIS — I1310 Hypertensive heart and chronic kidney disease without heart failure, with stage 1 through stage 4 chronic kidney disease, or unspecified chronic kidney disease: Secondary | ICD-10-CM | POA: Diagnosis present

## 2019-02-07 DIAGNOSIS — Z800 Family history of malignant neoplasm of digestive organs: Secondary | ICD-10-CM | POA: Diagnosis not present

## 2019-02-07 DIAGNOSIS — Z9842 Cataract extraction status, left eye: Secondary | ICD-10-CM | POA: Diagnosis not present

## 2019-02-07 DIAGNOSIS — D469 Myelodysplastic syndrome, unspecified: Secondary | ICD-10-CM | POA: Diagnosis present

## 2019-02-07 DIAGNOSIS — R339 Retention of urine, unspecified: Secondary | ICD-10-CM | POA: Diagnosis not present

## 2019-02-07 DIAGNOSIS — R079 Chest pain, unspecified: Secondary | ICD-10-CM | POA: Diagnosis not present

## 2019-02-07 DIAGNOSIS — R072 Precordial pain: Secondary | ICD-10-CM | POA: Diagnosis not present

## 2019-02-07 DIAGNOSIS — Z823 Family history of stroke: Secondary | ICD-10-CM | POA: Diagnosis not present

## 2019-02-07 DIAGNOSIS — X501XXA Overexertion from prolonged static or awkward postures, initial encounter: Secondary | ICD-10-CM | POA: Diagnosis not present

## 2019-02-07 DIAGNOSIS — M25571 Pain in right ankle and joints of right foot: Secondary | ICD-10-CM | POA: Diagnosis not present

## 2019-02-07 DIAGNOSIS — I10 Essential (primary) hypertension: Secondary | ICD-10-CM | POA: Diagnosis not present

## 2019-02-07 DIAGNOSIS — Z8546 Personal history of malignant neoplasm of prostate: Secondary | ICD-10-CM | POA: Diagnosis not present

## 2019-02-07 DIAGNOSIS — S93401A Sprain of unspecified ligament of right ankle, initial encounter: Secondary | ICD-10-CM | POA: Diagnosis not present

## 2019-02-07 DIAGNOSIS — M19071 Primary osteoarthritis, right ankle and foot: Secondary | ICD-10-CM | POA: Diagnosis not present

## 2019-02-07 DIAGNOSIS — N183 Chronic kidney disease, stage 3 unspecified: Secondary | ICD-10-CM | POA: Diagnosis present

## 2019-02-07 DIAGNOSIS — R195 Other fecal abnormalities: Secondary | ICD-10-CM | POA: Diagnosis not present

## 2019-02-07 DIAGNOSIS — D62 Acute posthemorrhagic anemia: Secondary | ICD-10-CM | POA: Diagnosis not present

## 2019-02-07 DIAGNOSIS — I350 Nonrheumatic aortic (valve) stenosis: Secondary | ICD-10-CM | POA: Diagnosis present

## 2019-02-07 DIAGNOSIS — K2100 Gastro-esophageal reflux disease with esophagitis, without bleeding: Secondary | ICD-10-CM | POA: Diagnosis present

## 2019-02-07 DIAGNOSIS — I2510 Atherosclerotic heart disease of native coronary artery without angina pectoris: Secondary | ICD-10-CM | POA: Diagnosis present

## 2019-02-07 DIAGNOSIS — Z7982 Long term (current) use of aspirin: Secondary | ICD-10-CM | POA: Diagnosis not present

## 2019-02-07 DIAGNOSIS — Z951 Presence of aortocoronary bypass graft: Secondary | ICD-10-CM | POA: Diagnosis not present

## 2019-02-07 DIAGNOSIS — I440 Atrioventricular block, first degree: Secondary | ICD-10-CM | POA: Diagnosis present

## 2019-02-07 DIAGNOSIS — Z888 Allergy status to other drugs, medicaments and biological substances status: Secondary | ICD-10-CM | POA: Diagnosis not present

## 2019-02-07 DIAGNOSIS — R778 Other specified abnormalities of plasma proteins: Secondary | ICD-10-CM | POA: Diagnosis not present

## 2019-02-07 DIAGNOSIS — Z419 Encounter for procedure for purposes other than remedying health state, unspecified: Secondary | ICD-10-CM | POA: Diagnosis not present

## 2019-02-07 LAB — CBC
HCT: 21.3 % — ABNORMAL LOW (ref 39.0–52.0)
Hemoglobin: 6.8 g/dL — CL (ref 13.0–17.0)
MCH: 33.2 pg (ref 26.0–34.0)
MCHC: 31.9 g/dL (ref 30.0–36.0)
MCV: 103.9 fL — ABNORMAL HIGH (ref 80.0–100.0)
Platelets: 167 10*3/uL (ref 150–400)
RBC: 2.05 MIL/uL — ABNORMAL LOW (ref 4.22–5.81)
RDW: 13.9 % (ref 11.5–15.5)
WBC: 9.4 10*3/uL (ref 4.0–10.5)
nRBC: 0 % (ref 0.0–0.2)

## 2019-02-07 LAB — TROPONIN I (HIGH SENSITIVITY): Troponin I (High Sensitivity): 24 ng/L — ABNORMAL HIGH (ref <18)

## 2019-02-07 NOTE — Progress Notes (Signed)
Home health agencies that serve [id number].        [place] Quality of Patient Care Rating Patient Survey Summary Rating  [facility] [phone]  out of 5 stars 5 out of [place] [phone]  out of 5 stars 4 out of [place] [phone]  out of 5 stars 5 out of [place] [phone] 4 out of 5 stars 4 out of [place] [phone] out of 5 stars 4 out of [place] [phone]  out of 5 stars 4 out of 5 stars  [facility] [place] [phone]  out of 5 stars 4 out of [place] [phone] 3 out of 5 stars 4 out of [place] [phone] 3  out of 5 stars 4 out of [place] [phone] 3  out of 5 stars 4 out of [place] number Footnote as displayed on [place]  1 This agency provides services under a federal waiver program to non-traditional, chronic long term population.  2 This agency provides services to a special needs population.  3 Not Available.  4 The number of patient episodes for this measure is too small to report.  5 This measure currently does not have data or provider has been certified/recertified for less than 6 months.  6 The national average for this measure is not provided because of state-to-state differences in data collection.  7 Medicare is not displaying rates for this measure for any home health agency, because of an issue with the data.  8 There were problems with the data and they are being corrected.  9 Zero, or very few, patients met the survey's rules for inclusion. The scores shown, if any, reflect a very small number of surveys and may not accurately tell how an  agency is doing.  10 Survey results are based on less than 12 months of data.  11 Fewer than 70 patients completed the survey. Use the scores shown, if any, with caution as the number of surveys may be too low to accurately tell how an agency is doing.  12 No survey results are available for this period.  13 Data suppressed by CMS for one or more quarters.

## 2019-02-07 NOTE — Progress Notes (Addendum)
Dr. Stann Mainland paged regarding critical lab of Hgb 6.8, ordered to give 2units of PRBC and to consult hospitalist for evaluation; will continue to monitor pt.

## 2019-02-07 NOTE — TOC Progression Note (Signed)
Transition of Care [facility]) - Progression Note    Patient Details  Name: Jon Reid MRN: [id number] Date of Birth: May 25, 1928  [facility] CM/SW Contact  Joaquin Courts, RN Phone Number: 02/07/2019, 4:04 PM  Clinical Narrative:    CM spoke with patient at bedside.  [facility] with [facility] for HHPT/OT. [facility] to deliver 3-in-1 to bedside for home use, patient reports has a rolling walker at home.    Expected Discharge Plan: [place] Barriers to Discharge: No Barriers Identified  Expected Discharge Plan and Services Expected Discharge Plan: [place]   Discharge Planning Services: CM Consult Post Acute Care Choice: [place] arrangements for the past 2 months: Single Family Home Expected Discharge Date: 02/07/19               DME Arranged: 3-N-1 DME Agency: [facility] Date DME Agency Contacted: 02/07/19 Time DME Agency Contacted: 1100 Representative spoke with at DME Agency: [place]: PT, OT [place] Agency: [place] ([place]) Date [place]: 02/07/19 Time [place]: 1603 Representative spoke with at [place]: [place] ([place]) Interventions    Readmission Risk Interventions No flowsheet data found.

## 2019-02-07 NOTE — Progress Notes (Signed)
Physical Therapy Treatment Patient Details Name: Jon Reid MRN: [id number] DOB: [birth year] Today's Date: 02/07/2019    History of Present Illness Pt is an 85 year old male s/p LUMBAR LAMINECTOMY/DECOMPRESSION L3-L4 due to spinal stenosis with PMHx of CABG x4, CAD, right BBB, HTN    PT Comments    Pt with progression of gait distance today. No pain reported with session. Pt alert and able to respond appropriately to all questions, conversational with session. Acute PT to continue during pt's hospital stay.   Follow Up Recommendations  Follow surgeon's recommendation for DC plan and follow-up therapies(noted discharge orders for HHPT/OT writtent by MD)     Equipment Recommendations  Rolling walker with 5" wheels       Precautions / Restrictions Precautions Precautions: Fall;Back Precaution Comments: reiewed throughout session Restrictions Weight Bearing Restrictions: No    Mobility  Bed Mobility               General bed mobility comments: not addressed- in chair before and after session  Transfers Overall transfer level: Needs assistance Equipment used: Rolling walker (2 wheeled) Transfers: Sit to/from Stand Sit to Stand: Min guard         General transfer comment: cues for hand placement for safety and for anterior weight shifting. no physical assistance needed  Ambulation/Gait Ambulation/Gait assistance: Min guard Gait Distance (Feet): 180 Feet Assistive device: Rolling walker (2 wheeled) Gait Pattern/deviations: Step-through pattern;Decreased stride length;Trunk flexed Gait velocity: increased   General Gait Details: cues for walker position and for upright posture with gait. no balance issues noted. min guard for safety due to fast gait speed.          Cognition Arousal/Alertness: Awake/alert Behavior During Therapy: WFL for tasks assessed/performed Overall Cognitive Status: Within Functional Limits for tasks assessed                  General Comments: HOH, did follow all commands and responed appropriately to quesions with session.       Pertinent Vitals/Pain Pain Assessment: No/denies pain Pain Score: 0-No pain     PT Goals (current goals can now be found in the care plan section) Acute Rehab PT Goals Patient Stated Goal: none stated PT Goal Formulation: With patient Time For Goal Achievement: 02/10/19 Potential to Achieve Goals: Good    Frequency    Min 5X/week      PT Plan Current plan remains appropriate    AM-PAC PT "6 Clicks" Mobility   Outcome Measure  Help needed turning from your back to your side while in a flat bed without using bedrails?: A Little Help needed moving from lying on your back to sitting on the side of a flat bed without using bedrails?: A Little Help needed moving to and from a bed to a chair (including a wheelchair)?: A Little Help needed standing up from a chair using your arms (e.g., wheelchair or bedside chair)?: A Little Help needed to walk in hospital room?: A Little Help needed climbing 3-5 steps with a railing? : A Lot 6 Click Score: 17    End of Session Equipment Utilized During Treatment: Gait belt Activity Tolerance: Patient tolerated treatment well;No increased pain Patient left: in chair;with call bell/phone within reach;with chair alarm set Nurse Communication: Mobility status PT Visit Diagnosis: Difficulty in walking, not elsewhere classified (R26.2)     Time: [id number] PT Time Calculation (min) (ACUTE ONLY): 11 min  Charges:  $Gait Training: 8-22 mins  Willow Ora, PTA, CLT Acute [place] Office number- [phone]   Willow Ora 02/07/2019, 12:22 PM

## 2019-02-07 NOTE — Progress Notes (Signed)
Rapid Response Event Note  Overview: Called due to pt has Chest Pain 5/10, with tingling of left hand.     Initial Focused Assessment: Upon my assessment, pt rates chest pain 2/10.  Pt denies any other pain.  A/O and F/C, denies tingling of left hand. With further assessment pt not says "I am not hurting now"  Pt breath sounds decreased.  Pt breath sounds decreased.    Interventions: Pt placed on monitor, VSS see flowsheet.  EKG per protocol.   Plan of Care (if not transferred): Pt will remain in current room, RN paged Primary, MD with update and new orders see chart.  Pt primary RN instructed to called with any concerns.    Dyann Ruddle

## 2019-02-07 NOTE — Progress Notes (Signed)
Subjective: 2 Days Post-Op Procedure(s) (LRB): LUMBAR LAMINECTOMY/DECOMPRESSION L3-L4 (N/A) Patient reports pain as 1 on 0-10 scale.Jon Reid function in both feet.  Endorses resolution of preop pain.  He states he does feel weak.  Objective: Vital signs in last 24 hours: Temp:  [98.1 F (36.7 C)-99 F (37.2 C)] 98.1 F (36.7 C) (11/21 0630) Pulse Rate:  [67-88] 79 (11/21 0630) Resp:  [14-19] 18 (11/21 0630) BP: (97-133)/(38-68) 104/51 (11/21 0630) SpO2:  [96 %-100 %] 100 % (11/21 0630)  Intake/Output from previous day: 11/20 0701 - 11/21 0700 In: 1554.4 [P.O.:600; I.V.:704.4; IV Piggyback:250] Out: 600 [Urine:600] Intake/Output this shift: No intake/output data recorded.  Recent Labs    02/04/19 1400  HGB 11.5*   Recent Labs    02/04/19 1400  WBC 7.5  RBC 3.61*  HCT 37.1*  PLT 249   Recent Labs    02/04/19 1400  NA 140  K 4.1  CL 105  CO2 26  BUN 21  CREATININE 1.36*  GLUCOSE 107*  CALCIUM 9.4   Recent Labs    02/04/19 1400  INR 1.0    Neurologically intact   Assessment/Plan: 2 Days Post-Op Procedure(s) (LRB): LUMBAR LAMINECTOMY/DECOMPRESSION L3-L4 (N/A) Up with therapy.  -No urinary complaints today.  -We will see how he does when he gets up and walks.  Certainly he could discharge home later today if he feels comfortable with that.      Jon Reid 02/07/2019, 9:28 AM

## 2019-02-07 NOTE — Progress Notes (Signed)
CRITICAL VALUE ALERT  Critical Value:  hgb 6.8   Date & Time Notied: 11/21 @2335   Provider Notified: Dr. Stann Mainland    Orders Received/Actions taken:

## 2019-02-07 NOTE — Progress Notes (Signed)
Occupational Therapy Treatment Patient Details Name: INGRID YANIK MRN: [id number] DOB: January 13, 1929 Today's Date: 02/07/2019    History of present illness Pt is an 85 year old male s/p LUMBAR LAMINECTOMY/DECOMPRESSION L3-L4 due to spinal stenosis with PMHx of CABG x4, CAD, right BBB, HTN   OT comments  Pt making steady progress toward stated goals. Focused session on BADL mobility progression. Pt requiring min A for bed mobility and mod A for sit <> stand from bed after x3 attempts. On second attempt, pt standing then quickly descending back onto bed with LOB. Pt then completed functional mobility with min guard-min A into BR for toilet transfer and standing to urinate. Pt will benefit from [facility] for added handles to push up from to stand. He has difficulty standing from low soft surfaces. Reviewed precautions and implications with BADL. Son called at end of session to confirm superversion and aftercare. He stated that him and his brothers check in often and it will not be a problem to check in more often if needed. Educated son on precautions as well. RN notified. Updated recs to [place]. Will continue to follow.   Follow Up Recommendations  Home health OT;Supervision/Assistance - 24 hour    Equipment Recommendations  3 in 1 bedside commode    Recommendations for Other Services      Precautions / Restrictions Precautions Precautions: Fall;Back Precaution Comments: reiewed throughout session Restrictions Weight Bearing Restrictions: No       Mobility Bed Mobility Overal bed mobility: Needs Assistance Bed Mobility: Sidelying to Sit   Sidelying to sit: Min assist       General bed mobility comments: min A with cues to maintain precautions, use of hand rails  Transfers Overall transfer level: Needs assistance Equipment used: Rolling walker (2 wheeled) Transfers: Sit to/from Stand Sit to Stand: Mod assist         General transfer comment: mod A to stand from low bed. Pt with  one LOB back onto bed, with little concern. Does better when has handles to push up from    Balance Overall balance assessment: Needs assistance Sitting-balance support: Feet supported Sitting balance-Leahy Scale: Good     Standing balance support: Bilateral upper extremity supported;During functional activity Standing balance-Leahy Scale: Fair(to poor) Standing balance comment: one instance of LOB back onto bed from standing                           ADL either performed or assessed with clinical judgement   ADL Overall ADL's : Needs assistance/impaired                         Toilet Transfer: Min guard;Regular Toilet;Grab bars;RW Armed forces technical officer Details (indicate cue type and reason): use of bars to stand upright simulating; then stood at toilet to urinate with min guard for balance           General ADL Comments: reviewed precuations and BADL implications, practiced mobility and discussed home safety     Vision Patient Visual Report: No change from baseline     Perception     Praxis      Cognition Arousal/Alertness: Awake/alert Behavior During Therapy: WFL for tasks assessed/performed Overall Cognitive Status: Within Functional Limits for tasks assessed                                 General  Comments: slow to respond with vague answers at times. Needed question rephrased in different ways to appropriately respond. Poor historian on specifics of home set up        Exercises     Shoulder Instructions       General Comments      Pertinent Vitals/ Pain       Pain Assessment: No/denies pain Pain Score: 0-No pain  Home Living                                          Prior Functioning/Environment              Frequency  Min 2X/week        Progress Toward Goals  OT Goals(current goals can now be found in the care plan section)  Progress towards OT goals: Progressing toward goals  Acute  Rehab OT Goals Patient Stated Goal: go home soon OT Goal Formulation: With patient Time For Goal Achievement: 02/13/19 Potential to Achieve Goals: Good  Plan Discharge plan needs to be updated;Frequency remains appropriate    Co-evaluation                 AM-PAC OT "6 Clicks" Daily Activity     Outcome Measure   Help from another person eating meals?: None Help from another person taking care of personal grooming?: A Little Help from another person toileting, which includes using toliet, bedpan, or urinal?: A Little Help from another person bathing (including washing, rinsing, drying)?: A Lot Help from another person to put on and taking off regular upper body clothing?: A Little Help from another person to put on and taking off regular lower body clothing?: A Lot 6 Click Score: 17    End of Session Equipment Utilized During Treatment: Gait belt;Rolling walker  OT Visit Diagnosis: Muscle weakness (generalized) (M62.81);Unsteadiness on feet (R26.81)   Activity Tolerance Patient tolerated treatment well   Patient Left in chair;with call bell/phone within reach;with chair alarm set   Nurse Communication Mobility status        Time: [id number] OT Time Calculation (min): 22 min  Charges: OT General Charges $OT Visit: 1 Visit OT Treatments $Self Care/Home Management : 8-22 mins  Zenovia Jarred, MSOT, OTR/L [place] Office: [place] 02/07/2019, 12:28 PM

## 2019-02-07 NOTE — Progress Notes (Signed)
Pt up with one assist. He is very weak. He needs a lot of cues to stand up, move with walker, get into bed and move in bed. I spoke with his wife and one son about discharge.  The sons can help but did not say how much.  I suggested that the son who will be driving the patient home should come in the morning to get instruction from PT. The patients wife [name] said it would be Jon Reid.  I called him but got no answer or voicemail.  I spoke with [name] and expressed my concerns. I spoke with Dr Stann Mainland.  The patient will not discharge today. Will work with therapy tomorrow.

## 2019-02-08 LAB — PREPARE RBC (CROSSMATCH)

## 2019-02-08 LAB — TROPONIN I (HIGH SENSITIVITY): Troponin I (High Sensitivity): 94 ng/L — ABNORMAL HIGH (ref <18)

## 2019-02-08 LAB — HEMOGLOBIN AND HEMATOCRIT, BLOOD
HCT: 28 % — ABNORMAL LOW (ref 39.0–52.0)
Hemoglobin: 9 g/dL — ABNORMAL LOW (ref 13.0–17.0)

## 2019-02-08 MED ORDER — SODIUM CHLORIDE 0.9% IV SOLUTION
Freq: Once | INTRAVENOUS | Status: AC
  Administered 2019-02-08: 05:00:00 via INTRAVENOUS

## 2019-02-08 MED ORDER — SODIUM CHLORIDE 0.9% IV SOLUTION
Freq: Once | INTRAVENOUS | Status: AC
  Administered 2019-02-08: 02:00:00 via INTRAVENOUS

## 2019-02-08 NOTE — Progress Notes (Signed)
2nd unit of PRBC given with [id number]; VS stable; no c/o CP and NV; no signs of distress noted at this time; will monitor pt;

## 2019-02-08 NOTE — Progress Notes (Signed)
Latanya Maudlin, MD gave verbal orders for a stool guiac, collect two.

## 2019-02-08 NOTE — Progress Notes (Signed)
I have been in direct communication with the nurses overnight and early this morning regarding Jon Reid.  I have recommended trending troponins based on his short bout of chest pain with concomitant acute blood loss anemia.  He is currently getting to units of packed red blood cells.  We have also placed an order for a hospitalist consultation to help direct [name] as to the necessity for any further work-up.  We will continue to follow-up on his troponins which first set has come back at 24.  The next set will be approximately 4 hours from now.

## 2019-02-08 NOTE — Progress Notes (Signed)
Victorino December, MD paged regarding the pt's troponin level of 94.

## 2019-02-08 NOTE — Progress Notes (Signed)
Subjective: 3 Days Post-Op Procedure(s) (LRB): LUMBAR LAMINECTOMY/DECOMPRESSION L3-L4 (N/A)  Patient reports pain as mild to moderate.  Patient resting comfortably in bed this am.  Tolerating POs well.  Admits to flatus.  Early post-op phase complicated by CP and post-op anemia.  2 units PRBCs given this morning.  Hospitalist consulted.  Serial troponins ordered.  Denies lightheadedness, dizziness, fatigue, or weakness this morning.  Objective:   VITALS:  Temp:  [97.5 F (36.4 C)-100.3 F (37.9 C)] 98.1 F (36.7 C) (11/22 0537) Pulse Rate:  [72-93] 72 (11/22 0537) Resp:  [16-23] 17 (11/22 0537) BP: (104-131)/(53-65) 131/65 (11/22 0537) SpO2:  [78 %-100 %] 100 % (11/22 0537)  General: WDWN patient in NAD. Psych:  Appropriate mood and affect. Neuro:  A&O x 3, Moving all extremities, sensation intact to light touch HEENT:  EOMs intact Chest:  Even non-labored respirations Skin:  Dressing C/D/I, no rashes or lesions Extremities: warm/dry, mild edema, no erythema or echymosis.  No lymphadenopathy. Pulses: Popliteus 2+ MSK:  ROM: moving all extremities without difficulty, MMT: able to perform quad set bilaterally, (-) Homan's    LABS Recent Labs    02/07/19 2252  HGB 6.8*  WBC 9.4  PLT 167   No results for input(s): NA, K, CL, CO2, BUN, CREATININE, GLUCOSE in the last 72 hours. No results for input(s): LABPT, INR in the last 72 hours.   Assessment/Plan: 3 Days Post-Op Procedure(s) (LRB): LUMBAR LAMINECTOMY/DECOMPRESSION L3-L4 (N/A)  Patient seen in rounds for Dr. Gladstone Lighter  Up with therapy  Received 2 units PRBCs following post-op anemia.  Serial troponins ordered and Hospitalist consulted due to CP.  Will await Hospitalist recommendations.   Mechele Claude PA-C [facility] Office:  [phone]

## 2019-02-08 NOTE — Progress Notes (Signed)
Subjective: 3 Days Post-Op Procedure(s) (LRB): LUMBAR LAMINECTOMY/DECOMPRESSION L3-L4 (N/A) Patient reports pain as 1 on 0-10 scale. Icame over to see him this morning,Sunday,because of his delay in DC. Apparently he required 2-units of packed cells because of a decrease in his hemoglobin. He is sitting up in bed and his wife is present. His dorsiflexors are now Normal. His leg pain is just about absent. He walked 154feet yesterday. I am not sure why his HBg dropped so low,6.6.We are repeating it since he just completed his second unin of packed cell. He looks fine today. He does not have an ulcer history and has no abdominal pain Serial stool guiac studies have been ordered.We plan to DC him tomorrow. He had one episode of chest pain and a slightly elevated Troponin level. The Hospitalist was called to evaluate him. He has a history of GERD and this could have been his problem. No further chest pain. He has a history of heart issues.His dressing is dry..   Objective: Vital signs in last 24 hours: Temp:  [97.5 F (36.4 C)-100.3 F (37.9 C)] 98.9 F (37.2 C) (11/22 0815) Pulse Rate:  [72-93] 74 (11/22 0815) Resp:  [16-23] 16 (11/22 0815) BP: (105-135)/(54-65) 135/64 (11/22 0815) SpO2:  [96 %-100 %] 100 % (11/22 0815)  Intake/Output from previous day: 11/21 0701 - 11/22 0700 In: 811.1 [P.O.:540; I.V.:14.8; Blood:256.2] Out: 860 [Urine:860] Intake/Output this shift: Total I/O In: 412.5 [P.O.:120; Blood:292.5] Out: 500 [Urine:500]  Recent Labs    02/07/19 2252  HGB 6.8*   Recent Labs    02/07/19 2252  WBC 9.4  RBC 2.05*  HCT 21.3*  PLT 167   No results for input(s): NA, K, CL, CO2, BUN, CREATININE, GLUCOSE, CALCIUM in the last 72 hours. No results for input(s): LABPT, INR in the last 72 hours.  Neurologically intact Dorsiflexion/Plantar flexion intact   Assessment/Plan: 3 Days Post-Op Procedure(s) (LRB): LUMBAR LAMINECTOMY/DECOMPRESSION L3-L4 (N/A) Up with therapy Plan   to DC him tomorrow if stable.      Latanya Maudlin 02/08/2019, 10:44 AM

## 2019-02-08 NOTE — Progress Notes (Signed)
Pt denied any nausea this am. Will CTM.

## 2019-02-08 NOTE — Progress Notes (Signed)
Pt called c/o of chest pain, rated 5/10 scale, some tingling on left hand; pt was repositioned and O2 [place] placed; rapid response called and responded immediatly;  pt was about to be given nitroglycerine but as soon as this nurse got to the room pt's chest pain ease off down to 2/10; EKG done; paged Dr. Stann Mainland and ordered some blood works; will continue to monitor pt;

## 2019-02-08 NOTE — Progress Notes (Signed)
Physical Therapy Treatment Patient Details Name: Jon Reid MRN: [id number] DOB: 1928-08-05 Today's Date: 02/08/2019    History of Present Illness Pt is an 85 year old male s/p LUMBAR LAMINECTOMY/DECOMPRESSION L3-L4 due to spinal stenosis with PMHx of CABG x4, CAD, right BBB, HTN    PT Comments    Pt was seen for mobility and to assess the family comfort level with helping him. Wife was in attendance with all therapy today, reviewed back precautions and note she is not taking pt home today.  Family is in car and not available to hear instructions to pt so will need to review this information for them as needed to assist the parents.  Follow acutely for these needs, focusing on safety with back precautions and review of safety with all mobility.  As PT is writing this note, pt has a new finding of lab value of 94 troponin, will need to ck on this tomorrow before therapy.  Follow Up Recommendations  Follow surgeon's recommendation for DC plan and follow-up therapies(home health ordered)     Equipment Recommendations  Rolling walker with 5" wheels    Recommendations for Other Services       Precautions / Restrictions Precautions Precautions: Fall;Back Precaution Comments: reviewed precautions with pt and his wife Required Braces or Orthoses: (NA) Restrictions Weight Bearing Restrictions: No Other Position/Activity Restrictions: avoid bending lifting twisting, arching    Mobility  Bed Mobility Overal bed mobility: Needs Assistance Bed Mobility: Supine to Sit Rolling: Min guard Sidelying to sit: Min assist Supine to sit: Min assist     General bed mobility comments: min with cues for maintaining precautions  Transfers Overall transfer level: Needs assistance Equipment used: Rolling walker (2 wheeled) Transfers: Sit to/from Stand Sit to Stand: Mod assist         General transfer comment: mod to power up and min to steady initially  Ambulation/Gait Ambulation/Gait  assistance: Min guard Gait Distance (Feet): 200 Feet Assistive device: Rolling walker (2 wheeled) Gait Pattern/deviations: Step-through pattern;Decreased stride length;Trunk flexed Gait velocity: increased Gait velocity interpretation: <1.31 ft/sec, indicative of household ambulator General Gait Details: min guard for safety but requires cues for directing walker to stay inside the structure of walker   Stairs             Wheelchair Mobility    Modified Rankin (Stroke Patients Only)       Balance Overall balance assessment: Needs assistance Sitting-balance support: Feet supported Sitting balance-Leahy Scale: Good     Standing balance support: Bilateral upper extremity supported;During functional activity Standing balance-Leahy Scale: Fair                              Cognition Arousal/Alertness: Awake/alert Behavior During Therapy: WFL for tasks assessed/performed Overall Cognitive Status: Within Functional Limits for tasks assessed                                 General Comments: pt has wife in attendance to hear instructions      Exercises      General Comments General comments (skin integrity, edema, etc.): pt is reporting he is feeling better walking than lying in bed, motivated to work and sit supported on chair      Pertinent Vitals/Pain Pain Assessment: No/denies pain    Home Living  Prior Function            PT Goals (current goals can now be found in the care plan section) Acute Rehab PT Goals Patient Stated Goal: go home soon Progress towards PT goals: Progressing toward goals    Frequency    Min 5X/week      PT Plan Current plan remains appropriate    Co-evaluation              AM-PAC PT "6 Clicks" Mobility   Outcome Measure  Help needed turning from your back to your side while in a flat bed without using bedrails?: A Little Help needed moving from lying on your  back to sitting on the side of a flat bed without using bedrails?: A Little Help needed moving to and from a bed to a chair (including a wheelchair)?: A Lot Help needed standing up from a chair using your arms (e.g., wheelchair or bedside chair)?: A Lot Help needed to walk in hospital room?: A Little Help needed climbing 3-5 steps with a railing? : A Lot 6 Click Score: 15    End of Session Equipment Utilized During Treatment: Gait belt Activity Tolerance: Patient tolerated treatment well;Treatment limited secondary to medical complications (Comment) Patient left: in chair;with call bell/phone within reach;with chair alarm set;with family/visitor present Nurse Communication: Mobility status PT Visit Diagnosis: Muscle weakness (generalized) (M62.81);Difficulty in walking, not elsewhere classified (R26.2)     Time: [id number] PT Time Calculation (min) (ACUTE ONLY): 32 min  Charges:  $Gait Training: 8-22 mins $Therapeutic Activity: 8-22 mins                    Ramond Dial 02/08/2019, 1:35 PM   Mee Hives, PT MS Acute Rehab Dept. Number: [place]

## 2019-02-08 NOTE — Progress Notes (Signed)
   02/08/19 1349  OT Visit Information  Last OT Received On 02/08/19  Assistance Needed +1  History of Present Illness Pt is an 85 year old male s/p LUMBAR LAMINECTOMY/DECOMPRESSION L3-L4 due to spinal stenosis with PMHx of CABG x4, CAD, right BBB, HTN  Precautions  Precautions Fall;Back  Pain Assessment  Pain Assessment No/denies pain  Cognition  Arousal/Alertness Awake/alert  Behavior During Therapy WFL for tasks assessed/performed  Overall Cognitive Status Within Functional Limits for tasks assessed  General Comments pt recalled back precautions during ADL  Upper Extremity Assessment  Upper Extremity Assessment Generalized weakness  ADL  Grooming Oral care;Set up  Upper Body Bathing Set up  Lower Body Bathing Moderate assistance  Upper Body Dressing  Set up  Lower Body Dressing Maximal assistance  General ADL Comments pt states wife will assist with LB adls at home  Bed Mobility  General bed mobility comments in chair  Transfers  Equipment used Rolling walker (2 wheeled)  Sit to Stand Min assist  General transfer comment once assisted with max A to scoot to EOB  OT - End of Session  Activity Tolerance Patient tolerated treatment well  Patient left in chair;with call bell/phone within reach;with chair alarm set  OT Assessment/Plan  OT Visit Diagnosis Muscle weakness (generalized) (M62.81);Unsteadiness on feet (R26.81)  OT Frequency (ACUTE ONLY) Min 2X/week  Follow Up Recommendations Home health OT;Supervision/Assistance - 24 hour  OT Equipment 3 in 1 bedside commode  AM-PAC OT "6 Clicks" Daily Activity Outcome Measure (Version 2)  Help from another person eating meals? 4  Help from another person taking care of personal grooming? 3  Help from another person toileting, which includes using toliet, bedpan, or urinal? 3  Help from another person bathing (including washing, rinsing, drying)? 2  Help from another person to put on and taking off regular upper body clothing? 3   Help from another person to put on and taking off regular lower body clothing? 2  6 Click Score 17  OT Goal Progression  Progress towards OT goals Progressing toward goals  OT Time Calculation  OT Start Time (ACUTE ONLY) 1255  OT Stop Time (ACUTE ONLY) 1319  OT Time Calculation (min) 24 min  OT General Charges  $OT Visit 1 Visit  OT Treatments  $Self Care/Home Management  23-37 mins  Lesle Chris, OTR/L Acute Rehabilitation Services [phone] [facility] pager [phone] office 02/08/2019

## 2019-02-08 NOTE — Progress Notes (Signed)
Spoke to Welford Roche (son) updated that pt will be getting 2units of PRBC, verbalized understanding;

## 2019-02-08 NOTE — Progress Notes (Signed)
1st unit of PRBC given with [id number]; VS stable; no signs of distress at this time; will continue to monitor pt;

## 2019-02-08 NOTE — Progress Notes (Signed)
CRITICAL VALUE ALERT  Critical Value: troponin I  Date & Time Notied: 11/22 @0107   Provider Notified: dr; Stann Mainland   Orders Received/Actions taken:will ffup order

## 2019-02-09 ENCOUNTER — Inpatient Hospital Stay (HOSPITAL_COMMUNITY): Payer: MEDICARE

## 2019-02-09 DIAGNOSIS — R072 Precordial pain: Secondary | ICD-10-CM

## 2019-02-09 DIAGNOSIS — R195 Other fecal abnormalities: Secondary | ICD-10-CM

## 2019-02-09 DIAGNOSIS — R079 Chest pain, unspecified: Secondary | ICD-10-CM

## 2019-02-09 DIAGNOSIS — Z419 Encounter for procedure for purposes other than remedying health state, unspecified: Secondary | ICD-10-CM

## 2019-02-09 DIAGNOSIS — R778 Other specified abnormalities of plasma proteins: Secondary | ICD-10-CM

## 2019-02-09 DIAGNOSIS — I2510 Atherosclerotic heart disease of native coronary artery without angina pectoris: Secondary | ICD-10-CM

## 2019-02-09 DIAGNOSIS — Z951 Presence of aortocoronary bypass graft: Secondary | ICD-10-CM

## 2019-02-09 DIAGNOSIS — I10 Essential (primary) hypertension: Secondary | ICD-10-CM

## 2019-02-09 DIAGNOSIS — R7989 Other specified abnormal findings of blood chemistry: Secondary | ICD-10-CM

## 2019-02-09 DIAGNOSIS — D469 Myelodysplastic syndrome, unspecified: Secondary | ICD-10-CM

## 2019-02-09 DIAGNOSIS — K2100 Gastro-esophageal reflux disease with esophagitis, without bleeding: Secondary | ICD-10-CM

## 2019-02-09 LAB — BPAM RBC
Blood Product Expiration Date: 202012272359
Blood Product Expiration Date: 202012272359
ISSUE DATE / TIME: 202011220123
ISSUE DATE / TIME: 202011220503
Unit Type and Rh: 5100
Unit Type and Rh: 5100

## 2019-02-09 LAB — IRON AND TIBC
Iron: 22 ug/dL — ABNORMAL LOW (ref 45–182)
Saturation Ratios: 9 % — ABNORMAL LOW (ref 17.9–39.5)
TIBC: 249 ug/dL — ABNORMAL LOW (ref 250–450)
UIBC: 227 ug/dL

## 2019-02-09 LAB — CBC
HCT: 30.3 % — ABNORMAL LOW (ref 39.0–52.0)
Hemoglobin: 9.6 g/dL — ABNORMAL LOW (ref 13.0–17.0)
MCH: 30.8 pg (ref 26.0–34.0)
MCHC: 31.7 g/dL (ref 30.0–36.0)
MCV: 97.1 fL (ref 80.0–100.0)
Platelets: 181 10*3/uL (ref 150–400)
RBC: 3.12 MIL/uL — ABNORMAL LOW (ref 4.22–5.81)
RDW: 18.5 % — ABNORMAL HIGH (ref 11.5–15.5)
WBC: 9.5 10*3/uL (ref 4.0–10.5)
nRBC: 0 % (ref 0.0–0.2)

## 2019-02-09 LAB — TYPE AND SCREEN
ABO/RH(D): O POS
Antibody Screen: NEGATIVE
Unit division: 0
Unit division: 0

## 2019-02-09 LAB — ECHOCARDIOGRAM COMPLETE
Height: 71 in
Weight: 2555.18 oz

## 2019-02-09 LAB — TROPONIN I (HIGH SENSITIVITY): Troponin I (High Sensitivity): 57 ng/L — ABNORMAL HIGH (ref <18)

## 2019-02-09 LAB — BASIC METABOLIC PANEL
Anion gap: 10 (ref 5–15)
BUN: 27 mg/dL — ABNORMAL HIGH (ref 8–23)
CO2: 25 mmol/L (ref 22–32)
Calcium: 8.8 mg/dL — ABNORMAL LOW (ref 8.9–10.3)
Chloride: 104 mmol/L (ref 98–111)
Creatinine, Ser: 1.31 mg/dL — ABNORMAL HIGH (ref 0.61–1.24)
GFR calc Af Amer: 56 mL/min — ABNORMAL LOW (ref >60)
GFR calc non Af Amer: 48 mL/min — ABNORMAL LOW (ref >60)
Glucose, Bld: 123 mg/dL — ABNORMAL HIGH (ref 70–99)
Potassium: 3.6 mmol/L (ref 3.5–5.1)
Sodium: 139 mmol/L (ref 135–145)

## 2019-02-09 LAB — OCCULT BLOOD X 1 CARD TO LAB, STOOL: Fecal Occult Bld: POSITIVE — AB

## 2019-02-09 LAB — RETICULOCYTES
Immature Retic Fract: 27.9 % — ABNORMAL HIGH (ref 2.3–15.9)
RBC.: 3.1 MIL/uL — ABNORMAL LOW (ref 4.22–5.81)
Retic Count, Absolute: 55.5 10*3/uL (ref 19.0–186.0)
Retic Ct Pct: 1.8 % (ref 0.4–3.1)

## 2019-02-09 LAB — MAGNESIUM: Magnesium: 1.9 mg/dL (ref 1.7–2.4)

## 2019-02-09 LAB — VITAMIN B12: Vitamin B-12: 552 pg/mL (ref 180–914)

## 2019-02-09 LAB — FERRITIN: Ferritin: 37 ng/mL (ref 24–336)

## 2019-02-09 LAB — FOLATE: Folate: 9.2 ng/mL (ref >5.9)

## 2019-02-09 NOTE — Consult Note (Signed)
[facility] Medical Consultation  Jon Reid [id number] DOB: 1928/09/19 DOA: 02/05/2019 PCP: Baruch Gouty, FNP   Requesting physician: Dr. Gladstone Lighter  Date of consultation: 02/09/2019   Reason for consultation: Positive troponins, stool occult blood positive  Impression/Recommendations Principal Problem:   Spinal stenosis, lumbar region with neurogenic claudication Active Problems:   Benign essential HTN   Myelodysplastic syndrome   Hx of CABG   Coronary artery disease involving native coronary artery of native heart without angina pectoris   Gastroesophageal reflux disease with esophagitis   Elevated troponin   Fecal occult blood test positive  Spinal stenosis of the lumbar region with neuro genic claudication.  Status post lumbar laminectomy/decompression of the L3-L4 level by Dr. Dellis Filbert on 02/05/2019.  Postoperative management as per primary team.  Patient is doing well as per the primary team.  Transient chest pain postoperative on 02/07/2019.  EKG showed right bundle branch block.  Troponins elevated at 24 followed by 94.  Could be demand ischemia.  No current chest pain.  History of CABG 2014, with chronic RBBB, moderate aortic stenosis.  Will get EKG stat, repeat troponins.  Check 2D echo for wall motion abnormality.  Hold off with aspirin due to significant anemia and occult blood positive.  No active GI bleed at this time.  Patient was seen by Dr. Percival Spanish, cardiology as outpatient on 01/28/2019 for preoperative evaluation and clearance..  Will consult cardiology at this time. Texted cardiology master.  Significant anemia.  Hemoccult positive.  Spoke with Dr. Dellis Filbert.  No significant postoperative blood loss was reported.  Patient did have significant drop in hemoglobin postoperatively.  Hemoglobin of 11.5 almost 5 days back.  Hemoglobin of 6.8 on 02/07/2019.  Received 2 units of packed RBC.  Hemoglobin of 9.0 today.  We will need to continue to monitor.  He stated  that he had a colonoscopy evaluation almost 5 years back or so and had polyps at that time.  Of note, patient does have myelodysplastic syndrome listed in his problem list.  He states that he had seen Dr. Julien Nordmann in the past long time back.  Vitamin B12 done 3 months back within normal limits.  Also check anemia panel.  Patient will likely need GI evaluation.  CKD stage III.  Will closely monitor.  BMP.  GERD.  We will continue with Protonix.  [facility] team will followup again tomorrow. Please contact me if I can be of assistance in the meanwhile. Thank you for this consultation.  Chief Complaint: Generalized weakness  HPI:  Patient is a 85 years old male who was electively admitted to the hospital for lumbar laminectomy for spinal stenosis with neurogenic claudication.  Patient underwent lumbar laminectomy and decompression on L3-L4 level by Dr. Dellis Filbert on 02/05/2019 and had unremarkable postoperative course.  On 02/07/2019 patient developed transient chest pain and an EKG was done which showed RBBB.  There was mild patient of troponin was trending up.  Subsequently patient was noted to have a significantly low hemoglobin of 6.8.  Hemoglobin prior to admission was 11.5.  Stool occult was done which was a positive.  Medical team was then consulted for admission to the hospital due to fetal hospital blood positive with elevated troponin.   At the time of my interview, patient denied any chest pain, dizziness, shortness of breath, dyspnea.  He has been participating with physical therapy and tolerating well.  Patient denied gross loss of blood, melena or rectal bleed.Marland Kitchen  He is slow to respond and poor  historian but stated that he had colonoscopy evaluation almost 5 to 6 years back and had polyps at that time.  He had seen Dr. Julien Nordmann hematooncology in the past as well for his anemia but this been several years.  As per the medical records, he is got a history of myelodysplastic syndrome.  Patient denies any  urinary urgency, frequency or dysuria.  Denies cough, shortness of breath, chest pain at this time.  Patient states that he takes aspirin at home.  Denies history of peptic ulceration.  Review of Systems:  All systems were reviewed and were negative except as in the history of presenting illness  Past Medical History:  Diagnosis Date  . Arthritis   . Carcinoma of prostate [facility])    prostate  . CKD (chronic kidney disease) stage 2, GFR 60-89 ml/min   . Coronary artery disease    a. LHC (8/14):  3v CAD => s/p CABG (LIMA-LAD, SVG-OM1, SVG-PDA)  . First degree AV block 01/28/2019   Noted on EKG  . GERD (gastroesophageal reflux disease)   . Gout   . HTN (hypertension)   . Hx of echocardiogram    Echocardiogram 11/13/12: EF 55-60%, normal wall motion, biatrial enlargement, mild AI, mild TR, no pericardial effusion  . Moderate aortic stenosis 12/06/2017   Noted on ECHO  . 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 Sleep apnea    Questionable  . Spinal stenosis    Past Surgical History:  Procedure Laterality Date  . CARDIAC CATHETERIZATION  11/07/2012   Dr Acie Fredrickson  . CATARACT EXTRACTION W/ INTRAOCULAR LENS IMPLANT Bilateral   . CORONARY ARTERY BYPASS GRAFT N/A 11/11/2012   Procedure: CORONARY ARTERY BYPASS GRAFTING times four using Right Greater Saphenous Vein Graft harvested endoscopically and Left Internal Mammary Artery.;  Surgeon: Ivin Poot, MD;  Location: [place];  Service: Open Heart Surgery;  Laterality: N/A;  . INGUINAL HERNIA REPAIR Right 01/10/2017   Procedure: OPEN RIGHT HERNIA REPAIR INGUINAL;  Surgeon: Ileana Roup, MD;  Location: [facility];  Service: General;  Laterality: Right;  . INSERTION OF MESH Right 01/10/2017   Procedure: INSERTION OF MESH;  Surgeon: Ileana Roup, MD;  Location: [facility];  Service: General;  Laterality: Right;  . INTRAOPERATIVE TRANSESOPHAGEAL ECHOCARDIOGRAM N/A 11/11/2012   Procedure:  INTRAOPERATIVE TRANSESOPHAGEAL ECHOCARDIOGRAM;  Surgeon: Ivin Poot, MD;  Location: [place];  Service: Open Heart Surgery;  Laterality: N/A;  . LEFT HEART CATHETERIZATION WITH CORONARY ANGIOGRAM N/A 11/07/2012   Procedure: LEFT HEART CATHETERIZATION WITH CORONARY ANGIOGRAM;  Surgeon: Thayer Headings, MD;  [facility] C[facility];  Service: Cardiovascular;  Laterality: N/A;  . LUMBAR LAMINECTOMY/DECOMPRESSION MICRODISCECTOMY N/A 02/05/2019   Procedure: LUMBAR LAMINECTOMY/DECOMPRESSION L3-L4;  Surgeon: Latanya Maudlin, MD;  Location: [facility];  Service: Orthopedics;  Laterality: N/A;  67min  . LYMPH NODE DISSECTION     Bilateral pelvic  . RETROPUBIC PROSTATECTOMY     Radical   Social History:  reports that he has never smoked. He has never used smokeless tobacco. He reports current alcohol use. He reports that he does not use drugs.  Allergies  Allergen Reactions  . Crestor [Rosuvastatin]     GYNECOMASTIA AND SWELLING   Family History  Problem Relation Age of Onset  . Aneurysm Father        Cerebral  . Stroke Father   . Hypertension Father   . Cancer Mother  colon  . Parkinson's disease Son     Prior to Admission medications   Medication Sig Start Date End Date Taking? Authorizing Provider  aspirin EC 81 MG tablet Take 81 mg by mouth daily.   Yes [provider]  Cholecalciferol (VITAMIN D) 2000 units CAPS Take 2,000 Units by mouth daily.   Yes [provider]  diclofenac sodium (VOLTAREN) 1 % GEL Apply 2 g topically 4 (four) times daily. 11/07/18  Yes Rakes, Connye Burkitt, FNP  fluticasone (FLONASE) 50 MCG/ACT nasal spray One to 2 sprays each nostril at bedtime Patient taking differently: Place 1-2 sprays into both nostrils daily as needed for allergies or rhinitis (depends on congestion if does 1-2 sprays).  10/12/14  Yes Chipper Herb, MD  furosemide (LASIX) 40 MG tablet Take 1 tablet (40 mg total) by mouth daily. 01/13/19  Yes Rakes, Connye Burkitt, FNP  pantoprazole  (PROTONIX) 40 MG tablet Take 1 tablet (40 mg total) by mouth daily. For stomach 03/13/18  Yes Stacks, [name], MD  triamcinolone cream (KENALOG) 0.1 % Apply 1 application topically 2 (two) times daily. Patient taking differently: Apply 1 application topically daily as needed (itching).  04/30/17  Yes Chipper Herb, MD  HYDROcodone-acetaminophen (NORCO/VICODIN) 5-325 MG tablet Take 1 tablet by mouth every 4 (four) hours as needed for moderate pain ((score 4 to 6)). 02/06/19   Constable, Amber, PA-C  methocarbamol (ROBAXIN) 500 MG tablet Take 1 tablet (500 mg total) by mouth every 6 (six) hours as needed for muscle spasms. 02/06/19   Constable, Amber, PA-C  nitroGLYCERIN (NITROSTAT) 0.4 MG SL tablet Place 1 tablet (0.4 mg total) under the tongue every 5 (five) minutes as needed for chest pain. 07/20/15   Erlene Quan, PA-C    Physical Exam: Blood pressure (!) 144/62, pulse 79, temperature 98.4 F (36.9 C), temperature source Oral, resp. rate 16, height 5\' 11"  (1.803 m), weight 72.4 kg, SpO2 97 %. Vitals:   02/08/19 2115 02/09/19 0503  BP: 121/61 (!) 144/62  Pulse: 81 79  Resp: 16 16  Temp: 98.6 F (37 C) 98.4 F (36.9 C)  SpO2: 97% 97%   Body mass index is 22.27 kg/m.   Intake/Output Summary (Last 24 hours) at 02/09/2019 [id number] Last data filed at 02/08/2019 2210 Gross per 24 hour  Intake 1012.5 ml  Output 1550 ml  Net -537.5 ml     General: Thinly built not in obvious distress HENT: Normocephalic, pupils equally reacting to light and accommodation.  No scleral pallor or icterus noted. Oral mucosa is moist.  Chest:  Clear breath sounds.  Diminished breath sounds bilaterally. No crackles or wheezes.  CVS: S1 &S2 heard.  Systolic murmur noted.  Regular rate and rhythm.  He will sternal scar from prior CABG. Abdomen: Soft, nontender, nondistended.  Bowel sounds are heard.  Liver is not palpable, no abdominal mass palpated Extremities: No cyanosis, clubbing or edema.  Peripheral pulses  are palpable. Psych: Alert, awake and oriented, normal mood CNS:  No cranial nerve deficits.  Power equal in all extremities.  No sensory deficits noted.  No cerebellar signs.   Skin: Warm and dry.  No rashes noted.  Laboratory Data:  CBC: Recent Labs  Lab 02/04/19 1400 02/07/19 2252 02/08/19 1021  WBC 7.5 9.4  --   NEUTROABS 4.8  --   --   HGB 11.5* 6.8* 9.0*  HCT 37.1* 21.3* 28.0*  MCV 102.8* 103.9*  --   PLT 249 167  --  Basic Metabolic Panel: Recent Labs  Lab 02/04/19 1400  NA 140  K 4.1  CL 105  CO2 26  GLUCOSE 107*  BUN 21  CREATININE 1.36*  CALCIUM 9.4    Liver Function Tests: Recent Labs  Lab 02/04/19 1400  AST 27  ALT 18  ALKPHOS 59  BILITOT 1.3*  PROT 7.1  ALBUMIN 4.5   No results for input(s): LIPASE, AMYLASE in the last 168 hours. No results for input(s): AMMONIA in the last 168 hours.  Cardiac Enzymes: No results for input(s): CKTOTAL, CKMB, CKMBINDEX, TROPONINI in the last 168 hours.  BNP: Invalid input(s): POCBNP  CBG: No results for input(s): GLUCAP in the last 168 hours.  Radiological Exams on Admission: No results found.  EKG: Independently reviewed.  EKG showed normal sinus rhythm with right bundle branch block left axis deviation.  Patient does have history of chronic RBBB.   Vernica Wachtel [facility] 02/09/2019

## 2019-02-09 NOTE — Progress Notes (Addendum)
Cardiology Consultation:   Patient ID: Jon Reid; [id number]; 1928/12/18   Admit date: 02/05/2019 Date of Consult: 02/09/2019  Primary Care Provider: Baruch Gouty, FNP Primary Cardiologist: Minus Breeding, MD 01/28/2019 Primary Electrophysiologist:  None   Patient Profile:   Jon Reid is a 85 y.o. male with a hx of CABG 2014, neg MV 2017, HTN, mod AS, RBBB/LAFB, GERD, CKD II, prostate CA, spinal stenosis who was admitted 11/19 for lumbar laminectomy/decompression and is being seen today for the evaluation of chest pain & elevated troponin at the request of Dr Gladstone Lighter.  History of Present Illness:   Jon Reid says he is very weak, feels like he might be starting to get a little strength back.  He was increasing his ambulation.  His hemoglobin dropped to 6.8, stool guaiac positive.  He was transfused 2 units of packed cells.  He had an episode of chest pain in the setting of a hemoglobin of 6.8, and his troponin level was increased, cards asked to evaluate.  Jon Reid states that he was able to walk more than a block prior to admission.  However, he was was having some fatigue with exertion and feels like his ability to exert himself had decreased.  He was having some shortness of breath with exertion as well.  The episode of chest pain started at rest, but in the setting of decreased hemoglobin.  He says that his chest felt "tired".  His left arm was also tired and he had trouble raising it at all.  He did not have any nausea, vomiting, or diaphoresis.  He has not been short of breath at rest, but has had some dyspnea on exertion.  He could not rate the chest discomfort.  He states that prior to his bypass surgery, he was having some chest pressure and shortness of breath with exertion.  However, he has not had those symptoms since the surgery.  He is not aware of ever losing blood through his GI tract.    Past Medical History:  Diagnosis Date  . Arthritis   .  Carcinoma of prostate [facility])    prostate  . CKD (chronic kidney disease) stage 2, GFR 60-89 ml/min   . Coronary artery disease    a. LHC (8/14):  3v CAD => s/p CABG (LIMA-LAD, SVG-OM1, SVG-PDA)  . First degree AV block 01/28/2019   Noted on EKG  . GERD (gastroesophageal reflux disease)   . Gout   . HTN (hypertension)   . Hx of echocardiogram    Echocardiogram 11/13/12: EF 55-60%, normal wall motion, biatrial enlargement, mild AI, mild TR, no pericardial effusion  . Moderate aortic stenosis 12/06/2017   Noted on ECHO  . 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 Sleep apnea    Questionable  . Spinal stenosis     Past Surgical History:  Procedure Laterality Date  . CARDIAC CATHETERIZATION  11/07/2012   Dr Acie Fredrickson  . CATARACT EXTRACTION W/ INTRAOCULAR LENS IMPLANT Bilateral   . CORONARY ARTERY BYPASS GRAFT N/A 11/11/2012   Procedure: CORONARY ARTERY BYPASS GRAFTING times four using Right Greater Saphenous Vein Graft harvested endoscopically and Left Internal Mammary Artery.;  Surgeon: Ivin Poot, MD;  Location: [place];  Service: Open Heart Surgery;  Laterality: N/A;  . INGUINAL HERNIA REPAIR Right 01/10/2017   Procedure: OPEN RIGHT HERNIA REPAIR INGUINAL;  Surgeon: Ileana Roup, MD;  Location: [facility];  Service:  General;  Laterality: Right;  . INSERTION OF MESH Right 01/10/2017   Procedure: INSERTION OF MESH;  Surgeon: Ileana Roup, MD;  Location: [facility];  Service: General;  Laterality: Right;  . INTRAOPERATIVE TRANSESOPHAGEAL ECHOCARDIOGRAM N/A 11/11/2012   Procedure: INTRAOPERATIVE TRANSESOPHAGEAL ECHOCARDIOGRAM;  Surgeon: Ivin Poot, MD;  Location: [place];  Service: Open Heart Surgery;  Laterality: N/A;  . LEFT HEART CATHETERIZATION WITH CORONARY ANGIOGRAM N/A 11/07/2012   Procedure: LEFT HEART CATHETERIZATION WITH CORONARY ANGIOGRAM;  Surgeon: Thayer Headings, MD;  Location: [facility] CATH LAB;  Service: Cardiovascular;   Laterality: N/A;  . LUMBAR LAMINECTOMY/DECOMPRESSION MICRODISCECTOMY N/A 02/05/2019   Procedure: LUMBAR LAMINECTOMY/DECOMPRESSION L3-L4;  Surgeon: Latanya Maudlin, MD;  Location: [facility];  Service: Orthopedics;  Laterality: N/A;  66min  . LYMPH NODE DISSECTION     Bilateral pelvic  . RETROPUBIC PROSTATECTOMY     Radical     Prior to Admission medications   Medication Sig Start Date End Date Taking? Authorizing Provider  aspirin EC 81 MG tablet Take 81 mg by mouth daily.   Yes [provider]  Cholecalciferol (VITAMIN D) 2000 units CAPS Take 2,000 Units by mouth daily.   Yes [provider]  diclofenac sodium (VOLTAREN) 1 % GEL Apply 2 g topically 4 (four) times daily. 11/07/18  Yes Rakes, Connye Burkitt, FNP  fluticasone (FLONASE) 50 MCG/ACT nasal spray One to 2 sprays each nostril at bedtime Patient taking differently: Place 1-2 sprays into both nostrils daily as needed for allergies or rhinitis (depends on congestion if does 1-2 sprays).  10/12/14  Yes Chipper Herb, MD  furosemide (LASIX) 40 MG tablet Take 1 tablet (40 mg total) by mouth daily. 01/13/19  Yes Rakes, Connye Burkitt, FNP  pantoprazole (PROTONIX) 40 MG tablet Take 1 tablet (40 mg total) by mouth daily. For stomach 03/13/18  Yes Stacks, [name], MD  triamcinolone cream (KENALOG) 0.1 % Apply 1 application topically 2 (two) times daily. Patient taking differently: Apply 1 application topically daily as needed (itching).  04/30/17  Yes Chipper Herb, MD  HYDROcodone-acetaminophen (NORCO/VICODIN) 5-325 MG tablet Take 1 tablet by mouth every 4 (four) hours as needed for moderate pain ((score 4 to 6)). 02/06/19   Constable, Amber, PA-C  methocarbamol (ROBAXIN) 500 MG tablet Take 1 tablet (500 mg total) by mouth every 6 (six) hours as needed for muscle spasms. 02/06/19   Constable, Amber, PA-C  nitroGLYCERIN (NITROSTAT) 0.4 MG SL tablet Place 1 tablet (0.4 mg total) under the tongue every 5 (five) minutes as needed for chest pain.  07/20/15   Erlene Quan, PA-C    Inpatient Medications: Scheduled Meds: . bupivacaine liposome  20 mL Infiltration Once  . feeding supplement (ENSURE ENLIVE)  237 mL Oral BID BM  . furosemide  40 mg Oral Daily  . mouth rinse  15 mL Mouth Rinse BID  . metoCLOPramide (REGLAN) injection  10 mg Intravenous Q6H  . multivitamin with minerals  1 tablet Oral Daily  . pantoprazole  40 mg Oral Daily   Continuous Infusions: . lactated ringers 100 mL/hr at 02/06/19 2148  . methocarbamol (ROBAXIN) IV     PRN Meds: acetaminophen **OR** acetaminophen, bisacodyl, fluticasone, HYDROcodone-acetaminophen, HYDROcodone-acetaminophen, HYDROmorphone (DILAUDID) injection, menthol-cetylpyridinium **OR** phenol, methocarbamol **OR** methocarbamol (ROBAXIN) IV, nitroGLYCERIN, ondansetron **OR** ondansetron (ZOFRAN) IV, polyethylene glycol  Allergies:    Allergies  Allergen Reactions  . Crestor [Rosuvastatin]     GYNECOMASTIA AND SWELLING    Social History:   Social History   Socioeconomic History  .  Marital status: Married    Spouse name: [name]  . Number of children: 3  . Years of education: 49  . Highest education level: High school graduate  Occupational History  . Occupation: Retired  Scientific laboratory technician  . Financial resource strain: Not hard at all  . Food insecurity    Worry: Never true    Inability: Never true  . Transportation needs    Medical: No    Non-medical: No  Tobacco Use  . Smoking status: Never Smoker  . Smokeless tobacco: Never Used  Substance and Sexual Activity  . Alcohol use: Yes    Comment: Rare  . Drug use: No  . Sexual activity: Not Currently  Lifestyle  . Physical activity    Days per week: 7 days    Minutes per session: 30 min  . Stress: Not at all  Relationships  . Social connections    Talks on phone: More than three times a week    Gets together: More than three times a week    Attends religious service: Never    Active member of club or organization: No     Attends meetings of clubs or organizations: Never    Relationship status: Married  . Intimate partner violence    Fear of current or ex partner: No    Emotionally abused: No    Physically abused: No    Forced sexual activity: No  Other Topics Concern  . Not on file  Social History Narrative   Married with 3 children   Denies caffeine use     Family History:   Family History  Problem Relation Age of Onset  . Aneurysm Father        Cerebral  . Stroke Father   . Hypertension Father   . Cancer Mother        colon  . Parkinson's disease Son    Family Status:  Family Status  Relation Name Status  . Father  Deceased  . Brother  Alive  . Mother  Deceased  . Son lee Alive  . Son [employer]  . Son [employer]  . MGM  Deceased  . MGF  Deceased  . PGM  Deceased  . PGF  Deceased    ROS:  Please see the history of present illness.  All other ROS reviewed and negative.     Physical Exam/Data:   Vitals:   02/08/19 0815 02/08/19 1439 02/08/19 2115 02/09/19 0503  BP: 135/64 (!) 107/51 121/61 (!) 144/62  Pulse: 74 72 81 79  Resp: 16 16 16 16   Temp: 98.9 F (37.2 C) 98.3 F (36.8 C) 98.6 F (37 C) 98.4 F (36.9 C)  TempSrc: Oral Oral Oral Oral  SpO2: 100% 100% 97% 97%  Weight:      Height:        Intake/Output Summary (Last 24 hours) at 02/09/2019 0818 Last data filed at 02/08/2019 2210 Gross per 24 hour  Intake 600 ml  Output 1150 ml  Net -550 ml   Filed Weights   02/05/19 1306  Weight: 72.4 kg   Body mass index is 22.27 kg/m.  General:  Well nourished, well developed, elderly male in no acute distress HEENT: normal Lymph: no adenopathy Neck: JVD -9 cm Endocrine:  No thryomegaly Vascular: No carotid bruits; 4/4 extremity pulses 2+, without bruits  Cardiac:  normal S1, S2; RRR; 2/6 murmur Lungs:  clear bilaterally, no wheezing, rhonchi or rales  Abd: soft, nontender, no hepatomegaly  Ext:  no edema Musculoskeletal:  No deformities, BUE and BLE strength  normal and equal Skin: warm and dry  Neuro:  CNs 2-12 intact, no focal abnormalities noted Psych:  Normal affect   EKG:  The EKG was personally reviewed and demonstrates: 11/23 ECG is sinus rhythm, heart rate 75, RBBB and first-degree AV block Telemetry:  Telemetry was personally reviewed and demonstrates: N/A   CV studies:   ECHO: ORDERED  ECHO: 12/06/2017 - Left ventricle: The cavity size was normal. Wall thickness was   increased in a pattern of mild LVH. Systolic function was normal.   The estimated ejection fraction was in the range of 60% to 65%.   Wall motion was normal; there were no regional wall motion   abnormalities. Left ventricular diastolic function parameters   were normal. - Aortic valve: Moderate thickening and calcification involving the   noncoronary cusp. Noncoronary cusp mobility was restricted. There   was moderate stenosis. There was trivial regurgitation. Mean   gradient (S): 17 mm Hg. Peak gradient (S): 34 mm Hg. VTI ratio of   LVOT to aortic valve: 0.32. Valve area by planimetry 1.24 cm2. - Mitral valve: Mildly to moderately calcified annulus. There was   mild regurgitation. Valve area by pressure half-time: 2.27 cm^2.   Valve area by continuity equation (using LVOT flow): 1.83 cm^2. - Left atrium: The atrium was mildly dilated. - Right atrium: Central venous pressure (est): 3 mm Hg. - Atrial septum: No defect or patent foramen ovale was identified. - Tricuspid valve: There was mild regurgitation. - Pulmonary arteries: PA peak pressure: 34 mm Hg (S). - Pericardium, extracardiac: There was no pericardial effusion.  MYOVIEW: 07/25/2015 FINDINGS: Perfusion: There is decreased uptake along the base and proximal septum compatible with a remote infarct. No focal profusion abnormality is present on the stress images.  Wall Motion: Decreased wall motion is present in the proximal septum. Wall motion and contractility are otherwise within normal limits.   Left Ventricular Ejection Fraction: 57 %  End diastolic volume 123XX123 ml  End systolic volume 51 ml  IMPRESSION: 1. Focal infarct along the base and proximal septum without areas of reversible ischemia.  2. Hypokinesia along the proximal septum. Wall motion is otherwise normal.  3. Left ventricular ejection fraction 57%  4. Low-risk stress test findings*.   CATH: None since CABG   Laboratory Data:   Chemistry Recent Labs  Lab 02/04/19 1400  NA 140  K 4.1  CL 105  CO2 26  GLUCOSE 107*  BUN 21  CREATININE 1.36*  CALCIUM 9.4  GFRNONAA 46*  GFRAA 53*  ANIONGAP 9    Lab Results  Component Value Date   ALT 18 02/04/2019   AST 27 02/04/2019   ALKPHOS 59 02/04/2019   BILITOT 1.3 (H) 02/04/2019   Hematology Recent Labs  Lab 02/04/19 1400 02/07/19 2252 02/08/19 1021  WBC 7.5 9.4  --   RBC 3.61* 2.05*  --   HGB 11.5* 6.8* 9.0*  HCT 37.1* 21.3* 28.0*  MCV 102.8* 103.9*  --   MCH 31.9 33.2  --   MCHC 31.0 31.9  --   RDW 13.6 13.9  --   PLT 249 167  --    Cardiac Enzymes High Sensitivity Troponin:   Recent Labs  Lab 02/07/19 2252 02/08/19 1021  TROPONINIHS 24* 94*     TSH:  Lab Results  Component Value Date   TSH 2.180 10/23/2018   Lipids: Lab Results  Component Value Date   CHOL 148  05/09/2018   HDL 62 05/09/2018   LDLCALC 76 05/09/2018   TRIG 50 05/09/2018   CHOLHDL 2.4 05/09/2018   HgbA1c: Lab Results  Component Value Date   HGBA1C 5.5% 05/31/2014   Magnesium:  Magnesium  Date Value Ref Range Status  11/12/2012 2.6 (H) 1.5 - 2.5 mg/dL Final    Radiology/Studies:  Dg Spine Portable 1 View  Result Date: 02/05/2019 CLINICAL DATA:  Intraoperative image number 3 EXAM: PORTABLE SPINE - 1 VIEW COMPARISON:  Same day radiographs, lumbar CT 01/16/2019 FINDINGS: Intraoperative radiograph demonstrates posterior retractor placement and direction of a pointed surgical implement at the lamina of L4. Additional osseous findings are  unchanged from comparisons. IMPRESSION: Pointed surgical implement at the lamina of L4.  Image labeled. Electronically Signed   By: Lovena Le M.D.   On: 02/05/2019 17:30   Dg Spine Portable 1 View  Result Date: 02/05/2019 CLINICAL DATA:  Intraoperative localization EXAM: PORTABLE SPINE - 1 VIEW COMPARISON:  Radiographs 02/05/2019, CT 01/16/2019 FINDINGS: Intraoperative radiograph the picks placement of 2 surgical clamps the first on the spinous process of L2 and the second on the spinous process of L3. Multilevel discogenic and facet degenerative changes are similar to comparison radiographs there is stable retrolisthesis L3 on L4 and anterolisthesis L4 on L5. Interspinous arthrosis is noted throughout the spine as well. IMPRESSION: Surgical clamps upon the spinous processes of L2 and L3. Electronically Signed   By: Lovena Le M.D.   On: 02/05/2019 15:39   Dg Spine Portable 1 View  Result Date: 02/05/2019 CLINICAL DATA:  Back surgery.  Number vertebra EXAM: PORTABLE SPINE - 1 VIEW COMPARISON:  02/04/2019 FINDINGS: Lowest disc space L5-S1 which is markedly narrowed. Anterolisthesis L4-5. Inferior endplate fracture of L3 is unchanged 2 needles are present in the soft tissues. One overlying the spinous process of L4 and the second needle overlying the spinous process of L5 IMPRESSION: Localization of L4 and L5 spinous processes. Electronically Signed   By: Franchot Gallo M.D.   On: 02/05/2019 15:24    Assessment and Plan:   1. Elevated troponin/chest pain -It happened without exertion, but in the setting of a hemoglobin of 6.8. -No history of exertional chest pain, but he has had some dyspnea on exertion recently -Echocardiogram is ordered, follow-up on results -His EF is normal and there is no progression of his valvular disease, consider Myoview -If his valvular heart disease has worsened or he has new left ventricular dysfunction/wall motion abnormality, needs cath but this might need to be  delayed in the setting of possible GI bleed  2.  Anemia -It happened postoperatively, but the amount of anemia was out of proportion to the blood loss during the surgery -Stool guaiacs are positive x2 -H&H improved with transfusion 2 units PRBCs -Per IM/Dr. Gladstone Lighter  Principal Problem:   Spinal stenosis, lumbar region with neurogenic claudication Active Problems:   Benign essential HTN   Myelodysplastic syndrome   Hx of CABG   Coronary artery disease involving native coronary artery of native heart without angina pectoris   Gastroesophageal reflux disease with esophagitis   Elevated troponin   Fecal occult blood test positive     For questions or updates, please contact [facility] Please consult [email or web address] for contact info under Cardiology/STEMI.   Signed, Rosaria Ferries, PA-C  02/09/2019 8:18 AM  As above, patient seen and examined.  Briefly he is an 85 year old male with past medical history of coronary artery disease status post coronary artery bypass and graft  in 2004, mild to moderate aortic stenosis, hypertension, trifascicular block, chronic stage III kidney disease, prostate cancer and now status post lumbar laminectomy/decompression for evaluation of chest pain and elevated troponin.  Patient's surgery was on November 19.  Postoperatively patient's hemoglobin decreased to 6.8.  He describes some weakness and also "tired feeling" in his left chest.  There is no dyspnea.  Symptoms lasted 2 to 3 hours and resolved.  Electrocardiogram showed sinus rhythm, first-degree AV block, right bundle branch block and left anterior fascicular block.  No ST changes.  Troponin was 24, 94 and 57.  Patient was transfused and his hemoglobin has increased to 9.6.  He has had no recurrent symptoms in his chest.  He does continue to have some fatigue but improved.  1 chest pain-patient described a "tired feeling" in his chest.  However this occurred in the setting of a hemoglobin of 6.8.   Troponin minimally elevated but no ST changes.  His symptoms have resolved with transfusion.  This is likely demand ischemia in the setting of significant anemia.  Echocardiogram is pending.  If LV function remains normal patient can likely be discharged tomorrow morning and follow-up with Dr. Percival Spanish.  Likely will need outpatient Lexiscan nuclear study once he recovers from his back surgery.  2 mild to moderate aortic stenosis-noted on previous echocardiogram.  Follow-up study is pending.  3 elevated troponin-as outlined above likely secondary to demand ischemia.  4 anemia-likely postoperative.  Patient also heme positive.  Further evaluation per hospitalist service.  5 coronary artery disease-patient apparently with statin intolerance.  Resume aspirin 81 mg daily when okay with surgery.  Kirk Ruths, MD

## 2019-02-09 NOTE — Progress Notes (Signed)
Physical Therapy Treatment Patient Details Name: Jon Reid MRN: [id number] DOB: June 10, 1928 Today's Date: 02/09/2019    History of Present Illness Pt is an 85 year old male s/p LUMBAR LAMINECTOMY/DECOMPRESSION L3-L4 due to spinal stenosis with PMHx of CABG x4, CAD, right BBB, HTN    PT Comments    POD #4 pm session. Pt demonstrated good motivation during today's treatment. General bed mobility comments: Pt required VC's to perform a log roll and HHA to sit up from sidelying and scoot out to the EOB General transfer comment: Pt was able to perform a sit to stand with min assist the [facility] elevated. Pt required VC's to push up from the bed and not the walker General Gait Details: Pt was able to ambulate 100 ft with a RW. Pt demonstrated a shuffling gait pattern (maybe due to R ankle/foot pain) When turning, pt stuffling got worse and stride lenght significantly decreased. Pt's back precautions were reviewed and pt given a hand out.    Follow Up Recommendations  Follow surgeon's recommendation for DC plan and follow-up therapies     Equipment Recommendations  Rolling walker with 5" wheels    Recommendations for Other Services       Precautions / Restrictions Precautions Precautions: Fall;Back Precaution Comments: reviewed precautions with pt Restrictions Weight Bearing Restrictions: No Other Position/Activity Restrictions: avoid bending lifting twisting, arching    Mobility  Bed Mobility Overal bed mobility: Needs Assistance Bed Mobility: Sidelying to Sit;Rolling Rolling: Min assist Sidelying to sit: Min assist;Mod assist       General bed mobility comments: Pt required VC's to perform a log roll and HHA to sit up from sidelying and scoot out to the EOB  Transfers Overall transfer level: Needs assistance Equipment used: Rolling walker (2 wheeled) Transfers: Sit to/from Stand Sit to Stand: Min assist;From elevated surface         General transfer comment: Pt was able  to perform a sit to stand with min assist the HOB elevated. Pt required VC's to push up from the bed and not the walker  Ambulation/Gait Ambulation/Gait assistance: Min guard Gait Distance (Feet): 100 Feet Assistive device: Rolling walker (2 wheeled) Gait Pattern/deviations: Step-through pattern;Decreased stride length;Trunk flexed;Shuffle     General Gait Details: Pt was able to ambulate 100 ft with a RW. Pt demonstrated a shuffling gait pattern (maybe due to R ankle/foot pain) When turning, pt stuffling got worse and stride lenght significantly decreased   Stairs             Wheelchair Mobility    Modified Rankin (Stroke Patients Only)       Balance                                            Cognition Arousal/Alertness: Awake/alert Behavior During Therapy: WFL for tasks assessed/performed Overall Cognitive Status: Within Functional Limits for tasks assessed                                        Exercises      General Comments        Pertinent Vitals/Pain Pain Assessment: 0-10 Pain Score: 6  Pain Location: R foot/ankle pain (no pain in back) Pain Descriptors / Indicators: Sore;Aching;Discomfort Pain Intervention(s): Monitored during session;Repositioned;Ice applied  Home Living                      Prior Function            PT Goals (current goals can now be found in the care plan section) Progress towards PT goals: Progressing toward goals    Frequency    Min 5X/week      PT Plan Current plan remains appropriate    Co-evaluation              AM-PAC PT "6 Clicks" Mobility   Outcome Measure  Help needed turning from your back to your side while in a flat bed without using bedrails?: A Little Help needed moving from lying on your back to sitting on the side of a flat bed without using bedrails?: A Little Help needed moving to and from a bed to a chair (including a wheelchair)?: A  Little Help needed standing up from a chair using your arms (e.g., wheelchair or bedside chair)?: A Little Help needed to walk in hospital room?: A Little Help needed climbing 3-5 steps with a railing? : A Lot 6 Click Score: 17    End of Session Equipment Utilized During Treatment: Gait belt Activity Tolerance: Patient tolerated treatment well Patient left: in chair;with call bell/phone within reach;with chair alarm set Nurse Communication: Mobility status PT Visit Diagnosis: Muscle weakness (generalized) (M62.81);Difficulty in walking, not elsewhere classified (R26.2)     Time: [id number] PT Time Calculation (min) (ACUTE ONLY): 29 min  Charges:  $Gait Training: 8-22 mins $Therapeutic Activity: 8-22 mins                     Excell Seltzer, [facility]

## 2019-02-09 NOTE — Progress Notes (Signed)
  Echocardiogram 2D Echocardiogram has been performed.  Matilde Bash 02/09/2019, 9:07 AM

## 2019-02-09 NOTE — Progress Notes (Signed)
Occupational Therapy Treatment Patient Details Name: Jon Reid MRN: [id number] DOB: 1928/10/23 Today's Date: 02/09/2019    History of present illness Pt is an 85 year old male s/p LUMBAR LAMINECTOMY/DECOMPRESSION L3-L4 due to spinal stenosis with PMHx of CABG x4, CAD, right BBB, HTN   OT comments  Pt able to verbalize 3/3 back precautions.  Follow Up Recommendations  Home health OT;Supervision/Assistance - 24 hour    Equipment Recommendations  3 in 1 bedside commode    Recommendations for Other Services      Precautions / Restrictions Precautions Precautions: Fall;Back Precaution Comments: reviewed precautions with pt Restrictions Weight Bearing Restrictions: No Other Position/Activity Restrictions: avoid bending lifting twisting, arching       Mobility Bed Mobility Overal bed mobility: Needs Assistance Bed Mobility: Sidelying to Sit;Rolling Rolling: Min assist Sidelying to sit: Min assist;Mod assist       General bed mobility comments: Pt required VC's to perform a log roll and HHA to sit up from sidelying and scoot out to the EOB  Transfers Overall transfer level: Needs assistance Equipment used: Rolling walker (2 wheeled) Transfers: Sit to/from Stand Sit to Stand: Min assist;From elevated surface         General transfer comment: Pt was able to perform a sit to stand with min assist the HOB elevated. Pt required VC's to push up from the bed and not the walker    Balance Overall balance assessment: Needs assistance Sitting-balance support: Feet supported Sitting balance-Leahy Scale: Good     Standing balance support: Bilateral upper extremity supported;During functional activity Standing balance-Leahy Scale: Fair                             ADL either performed or assessed with clinical judgement   ADL Overall ADL's : Needs assistance/impaired     Grooming: Wash/dry hands;Wash/dry face;Sitting;Set up               Lower Body  Dressing: Sit to/from stand;Cueing for safety;With adaptive equipment;Cueing for sequencing;Cueing for compensatory techniques;Maximal assistance                 General ADL Comments: pt states wife will assist with LB adls at home although he did want to practice with the reacherr this day. Pt stated wife had a reacher     Vision Patient Visual Report: No change from baseline            Cognition Arousal/Alertness: Awake/alert Behavior During Therapy: WFL for tasks assessed/performed Overall Cognitive Status: Within Functional Limits for tasks assessed                                                     Pertinent Vitals/ Pain       Pain Assessment: 0-10 Pain Score: 2  Pain Location: R foot/ankle pain (no pain in back) Pain Descriptors / Indicators: Sore Pain Intervention(s): Limited activity within patient's tolerance         Frequency  Min 2X/week        Progress Toward Goals  OT Goals(current goals can now be found in the care plan section)  Progress towards OT goals: Progressing toward goals     Plan Discharge plan remains appropriate       AM-PAC OT "6 Clicks" Daily Activity  Outcome Measure   Help from another person eating meals?: None Help from another person taking care of personal grooming?: A Little Help from another person toileting, which includes using toliet, bedpan, or urinal?: A Little Help from another person bathing (including washing, rinsing, drying)?: A Lot Help from another person to put on and taking off regular upper body clothing?: A Little Help from another person to put on and taking off regular lower body clothing?: A Lot 6 Click Score: 17    End of Session Equipment Utilized During Treatment: Gait belt;Rolling walker  OT Visit Diagnosis: Muscle weakness (generalized) (M62.81);Unsteadiness on feet (R26.81)   Activity Tolerance Patient tolerated treatment well   Patient Left in chair;with call  bell/phone within reach;with chair alarm set   Nurse Communication          Time: 0215-0235 OT Time Calculation (min): 20 min  Charges: OT General Charges $OT Visit: 1 Visit OT Treatments $Self Care/Home Management : 8-22 mins  [name], [place] [phone] Office- [phone], Edwena Felty D 02/09/2019, 3:42 PM

## 2019-02-09 NOTE — Progress Notes (Signed)
Progress Note  Patient Name: Jon Reid Date of Encounter: 02/10/2019  Primary Cardiologist:  Minus Breeding, MD  Subjective   Feels a little stronger today, yesterday was too weak to hold up his phone.  Inpatient Medications    Scheduled Meds: . bupivacaine liposome  20 mL Infiltration Once  . feeding supplement (ENSURE ENLIVE)  237 mL Oral BID BM  . furosemide  40 mg Oral Daily  . mouth rinse  15 mL Mouth Rinse BID  . metoCLOPramide (REGLAN) injection  10 mg Intravenous Q6H  . multivitamin with minerals  1 tablet Oral Daily  . pantoprazole  40 mg Oral Daily  . potassium chloride  40 mEq Oral Once   Continuous Infusions: . lactated ringers 100 mL/hr at 02/06/19 2148  . methocarbamol (ROBAXIN) IV    . phenylephrine     PRN Meds: acetaminophen **OR** acetaminophen, bisacodyl, fluticasone, HYDROcodone-acetaminophen, HYDROcodone-acetaminophen, HYDROmorphone (DILAUDID) injection, menthol-cetylpyridinium **OR** phenol, methocarbamol **OR** methocarbamol (ROBAXIN) IV, nitroGLYCERIN, ondansetron **OR** ondansetron (ZOFRAN) IV, polyethylene glycol   Vital Signs    Vitals:   02/09/19 0503 02/09/19 1449 02/09/19 2115 02/10/19 0556  BP: (!) 144/62 (!) 153/74 (!) 113/54 (!) 112/57  Pulse: 79 85 86 77  Resp: 16 16 16 16   Temp: 98.4 F (36.9 C) 98.1 F (36.7 C) 99.3 F (37.4 C) 98.2 F (36.8 C)  TempSrc: Oral Oral    SpO2: 97% 100% 96% 97%  Weight:      Height:        Intake/Output Summary (Last 24 hours) at 02/10/2019 1009 Last data filed at 02/10/2019 0630 Gross per 24 hour  Intake 470 ml  Output 1725 ml  Net -1255 ml   Filed Weights   02/05/19 1306  Weight: 72.4 kg   Last Weight  Most recent update: 02/05/2019  1:06 PM   Weight  72.4 kg (159 lb 11.2 oz)           Weight change:    Telemetry    Not on - Personally Reviewed  ECG    None today - Personally Reviewed  Physical Exam   General: Well developed, well nourished, male appearing in no  acute distress. Head: Normocephalic, atraumatic.  Neck: Supple without bruits, JVD 8-9 cm. Lungs:  Resp regular and unlabored, CTA. Heart: RRR, S1, S2, no S3, S4, 2/6 murmur; no rub. Abdomen: Soft, non-tender, non-distended with normoactive bowel sounds. No hepatomegaly. No rebound/guarding. No obvious abdominal masses. Extremities: No clubbing, cyanosis, no edema. Distal pedal pulses are 2+ bilaterally. Neuro: Alert and oriented X 3. Moves all extremities spontaneously. Psych: Normal affect.  Labs    Hematology Recent Labs  Lab 02/07/19 2252 02/08/19 1021 02/09/19 0820 02/10/19 0323  WBC 9.4  --  9.5 8.9  RBC 2.05*  --  3.12*  3.10* 3.16*  HGB 6.8* 9.0* 9.6* 9.6*  HCT 21.3* 28.0* 30.3* 30.1*  MCV 103.9*  --  97.1 95.3  MCH 33.2  --  30.8 30.4  MCHC 31.9  --  31.7 31.9  RDW 13.9  --  18.5* 17.9*  PLT 167  --  181 209    Chemistry Recent Labs  Lab 02/04/19 1400 02/09/19 0820 02/10/19 0323  NA 140 139 138  K 4.1 3.6 3.4*  CL 105 104 103  CO2 26 25 26   GLUCOSE 107* 123* 120*  BUN 21 27* 26*  CREATININE 1.36* 1.31* 1.28*  CALCIUM 9.4 8.8* 8.7*  PROT 7.1  --   --   ALBUMIN 4.5  --   --  AST 27  --   --   ALT 18  --   --   ALKPHOS 59  --   --   BILITOT 1.3*  --   --   GFRNONAA 46* 48* 50*  GFRAA 53* 56* 58*  ANIONGAP 9 10 9      High Sensitivity Troponin:   Recent Labs  Lab 02/07/19 2252 02/08/19 1021 02/09/19 0820  TROPONINIHS 24* 94* 57*     Lab Results  Component Value Date   CHOL 148 05/09/2018   HDL 62 05/09/2018   LDLCALC 76 05/09/2018   TRIG 50 05/09/2018   CHOLHDL 2.4 05/09/2018   Lab Results  Component Value Date   IRON 22 (L) 02/09/2019   TIBC 249 (L) 02/09/2019   FERRITIN 37 02/09/2019    Radiology    Dg Ankle 2 Views Right  Result Date: 02/10/2019 CLINICAL DATA:  Diffuse RIGHT ankle pain and swelling, mid RIGHT foot pain and swelling EXAM: RIGHT ANKLE - 2 VIEW COMPARISON:  None FINDINGS: Osseous demineralization. Ankle joint  space preserved. Tiny plantar calcaneal spur. No acute fracture, dislocation, or bone destruction. Minimal vascular calcification and few small calcified phleboliths at distal lower leg. IMPRESSION: No acute osseous abnormalities. Electronically Signed   By: Lavonia Dana M.D.   On: 02/10/2019 08:43   Dg Foot 2 Views Right  Result Date: 02/10/2019 CLINICAL DATA:  Diffuse RIGHT ankle pain and swelling, mid RIGHT foot pain and swelling EXAM: RIGHT FOOT - 2 VIEW COMPARISON:  None FINDINGS: Osseous demineralization. Joint space narrowing at first MTP joint. Remaining joint spaces preserved. Tiny plantar calcaneal spur. Mild soft tissue swelling at dorsum of foot. No acute fracture, dislocation, or bone destruction. IMPRESSION: Degenerative changes first MTP joint. No acute osseous abnormalities. Electronically Signed   By: Lavonia Dana M.D.   On: 02/10/2019 08:44     Cardiac Studies   ECHO:  02/09/2019  1. Left ventricular ejection fraction, by visual estimation, is 60 to 65%. The left ventricle has normal function. There is mildly increased left ventricular hypertrophy.  2. The left ventricle has no regional wall motion abnormalities.  3. Global right ventricle has normal systolic function.The right ventricular size is normal. No increase in right ventricular wall thickness.  4. Left atrial size was normal.  5. There is a mass seen just behind the left atrium, somewhat impinging on it. Favor hiatal hernia, recommend clinical correlation.  6. Right atrial size was normal.  7. Mild to moderate mitral annular calcification.  8. The mitral valve is degenerative. Trace mitral valve regurgitation.  9. The tricuspid valve is normal in structure. Tricuspid valve regurgitation is trivial. 10. The aortic valve is tricuspid. Aortic valve regurgitation is trivial. Mild aortic valve stenosis. Aortic valve mean gradient measures 17.0 mmHg. Aortic valve peak gradient measures 28.1 mmHg. 11. There is Moderate  calcification of the aortic valve. 12. There is Moderate thickening of the aortic valve. 13. The pulmonic valve was grossly normal. Pulmonic valve regurgitation is trivial. 14. Mildly elevated pulmonary artery systolic pressure.  Patient Profile     85 y.o. male w/ hx CABG 2014, neg MV 2017, HTN, mod AS, RBBB/LAFB, GERD, CKD II, prostate CA, spinal stenosis was admitted 11/19 for lumbar laminectomy/decompression and was seen 11/23 by cards for the evaluation of chest pain & elevated troponin.  Assessment & Plan    1. Chest pain, elevated troponin - ECG w/ no acute changes - echo results above, EF normal w/ no WMA - no ongoing  ischemic sx - no further workup needed - f/u as outpt  2. Echo results - ?mass behind L atrium, possibly HH, MD advise on eval - mild AS, gradients above - follow AS clinically, no volume overload by exam, no presyncope or syncope  3. Anemia, +FOB - H&H stable after transfusion - +iron deficiency - per IM/Dr Gioffre, but agree that he needs GI eval   Otherwise, per Dr Keene Breath Principal Problem:   Spinal stenosis, lumbar region with neurogenic claudication Active Problems:   Benign essential HTN   Myelodysplastic syndrome   Hx of CABG   Coronary artery disease involving native coronary artery of native heart without angina pectoris   Gastroesophageal reflux disease with esophagitis   Elevated troponin   Fecal occult blood test positive   Jonetta Speak , PA-C 10:09 AM 02/10/2019 Pager: [phone]

## 2019-02-09 NOTE — Progress Notes (Signed)
Subjective: 4 Days Post-Op Procedure(s) (LRB): LUMBAR LAMINECTOMY/DECOMPRESSION L3-L4 (N/A) Patient reports pain as 1 on 0-10 scale.  Elevated Troponin level and stool is positive for blood. No GI history according to the patient. His HBg ccame up to (.0 after transfusion. He informed me that DR. Mohammed has seen him in the past for some type of "Blood Disorder". He is very vague in regards what his problem was.Hospitalist has not seen him yet. We will call again. No chest Pain the past two days.I will call DR. Mohammed.Mild right ankle sprain .  Objective: Vital signs in last 24 hours: Temp:  [98.3 F (36.8 C)-98.9 F (37.2 C)] 98.4 F (36.9 C) (11/23 0503) Pulse Rate:  [72-81] 79 (11/23 0503) Resp:  [16] 16 (11/23 0503) BP: (107-144)/(51-64) 144/62 (11/23 0503) SpO2:  [97 %-100 %] 97 % (11/23 0503)  Intake/Output from previous day: 11/22 0701 - 11/23 0700 In: 1012.5 [P.O.:720; Blood:292.5] Out: 1650 [Urine:1650] Intake/Output this shift: No intake/output data recorded.  Recent Labs    02/07/19 2252 02/08/19 1021  HGB 6.8* 9.0*   Recent Labs    02/07/19 2252 02/08/19 1021  WBC 9.4  --   RBC 2.05*  --   HCT 21.3* 28.0*  PLT 167  --    No results for input(s): NA, K, CL, CO2, BUN, CREATININE, GLUCOSE, CALCIUM in the last 72 hours. No results for input(s): LABPT, INR in the last 72 hours.  Neurologically intact Dorsiflexion/Plantar flexion intact   Assessment/Plan: 4 Days Post-Op Procedure(s) (LRB): LUMBAR LAMINECTOMY/DECOMPRESSION L3-L4 (N/A) Up with therapy      Latanya Maudlin 02/09/2019, 7:26 AM

## 2019-02-10 ENCOUNTER — Inpatient Hospital Stay (HOSPITAL_COMMUNITY): Payer: MEDICARE

## 2019-02-10 DIAGNOSIS — M48062 Spinal stenosis, lumbar region with neurogenic claudication: Principal | ICD-10-CM

## 2019-02-10 LAB — BASIC METABOLIC PANEL
Anion gap: 9 (ref 5–15)
BUN: 26 mg/dL — ABNORMAL HIGH (ref 8–23)
CO2: 26 mmol/L (ref 22–32)
Calcium: 8.7 mg/dL — ABNORMAL LOW (ref 8.9–10.3)
Chloride: 103 mmol/L (ref 98–111)
Creatinine, Ser: 1.28 mg/dL — ABNORMAL HIGH (ref 0.61–1.24)
GFR calc Af Amer: 58 mL/min — ABNORMAL LOW (ref >60)
GFR calc non Af Amer: 50 mL/min — ABNORMAL LOW (ref >60)
Glucose, Bld: 120 mg/dL — ABNORMAL HIGH (ref 70–99)
Potassium: 3.4 mmol/L — ABNORMAL LOW (ref 3.5–5.1)
Sodium: 138 mmol/L (ref 135–145)

## 2019-02-10 LAB — CBC
HCT: 30.1 % — ABNORMAL LOW (ref 39.0–52.0)
Hemoglobin: 9.6 g/dL — ABNORMAL LOW (ref 13.0–17.0)
MCH: 30.4 pg (ref 26.0–34.0)
MCHC: 31.9 g/dL (ref 30.0–36.0)
MCV: 95.3 fL (ref 80.0–100.0)
Platelets: 209 10*3/uL (ref 150–400)
RBC: 3.16 MIL/uL — ABNORMAL LOW (ref 4.22–5.81)
RDW: 17.9 % — ABNORMAL HIGH (ref 11.5–15.5)
WBC: 8.9 10*3/uL (ref 4.0–10.5)
nRBC: 0 % (ref 0.0–0.2)

## 2019-02-10 LAB — MAGNESIUM: Magnesium: 2 mg/dL (ref 1.7–2.4)

## 2019-02-10 MED ORDER — POTASSIUM CHLORIDE CRYS ER 20 MEQ PO TBCR
40.0000 meq | EXTENDED_RELEASE_TABLET | Freq: Once | ORAL | Status: AC
  Administered 2019-02-10: 40 meq via ORAL
  Filled 2019-02-10: qty 2

## 2019-02-10 MED ORDER — PHENYLEPHRINE HCL-NACL 10-0.9 MG/250ML-% IV SOLN
INTRAVENOUS | Status: AC
  Filled 2019-02-10: qty 750

## 2019-02-10 MED ORDER — FERROUS SULFATE 325 (65 FE) MG PO TBEC: 325.0000 mg | DELAYED_RELEASE_TABLET | Freq: Two times a day (BID) | ORAL | 0 refills | Status: DC

## 2019-02-10 NOTE — Care Management Important Message (Signed)
Important Message  Patient Details IM Letter given to Kathrin Greathouse SW to present to the Patient Name: DEZMUND VELILLA MRN: [id number] Date of Birth: 03-14-1929   Medicare Important Message Given:  Yes     Kerin Salen 02/10/2019, 11:32 AM

## 2019-02-10 NOTE — Progress Notes (Signed)
PROGRESS NOTE    Jon Reid  [id number] DOB: 1928/05/26 DOA: 02/05/2019 PCP: Baruch Gouty, FNP   Brief Narrative:   Patient is a 85 years old male who was electively admitted to the hospital for lumbar laminectomy for spinal stenosis with neurogenic claudication.  Patient underwent lumbar laminectomy and decompression on L3-L4 level by Dr. Dellis Filbert on 02/05/2019 and had unremarkable postoperative course.  On 02/07/2019 patient developed transient chest pain and an EKG was done which showed RBBB.  There was mild elevation of troponin and this was trending up.  Subsequently patient was noted to have a significantly low hemoglobin of 6.8.  Hemoglobin prior to admission was 11.5.  Stool occult was done which was a positive.  Medical team was then consulted on 02/09/2019.  Cardiology was also consulted due to elevated troponin.  Assessment & Plan:   Principal Problem:   Spinal stenosis, lumbar region with neurogenic claudication Active Problems:   Benign essential HTN   Myelodysplastic syndrome   Hx of CABG   Coronary artery disease involving native coronary artery of native heart without angina pectoris   Gastroesophageal reflux disease with esophagitis   Elevated troponin   Fecal occult blood test positive  Spinal stenosis of the lumbar region with neuro genic claudication.  Status post lumbar laminectomy/decompression of the L3-L4 level by Dr. Dellis Filbert on 02/05/2019.  Postoperative management as per primary team.  Patient is doing well as per the primary team.  Transient chest pain postoperative on 02/07/2019.  EKG showed right bundle branch block.  Troponins elevated at 24 followed by 94.  Echo does not show any wall motion abnormality and has normal ejection fraction.  Cardiology consulted.  This is likely demand ischemia.  No further intervention now but cardiology recommends possible stress test as an outpatient and resuming aspirin when discharged by primary team.   Acute  blood loss anemia.  Hemoccult positive.    Per Dr. Gladstone Lighter.  No significant postoperative blood loss was reported.  Patient did have significant drop in hemoglobin postoperatively.  Hemoglobin of 11.5 almost 5 days back.  Hemoglobin of 6.8 on 02/07/2019.  Received 2 units of packed RBC.  Hemoglobin remained stable overnight and 9.6 yesterday and today.  No signs of GI bleed.  He stated that he had a colonoscopy evaluation almost 5 years back or so and had polyps at that time.  Of note, patient does have myelodysplastic syndrome listed in his problem list.  He states that he had seen Dr. Julien Nordmann in the past long time back.  Vitamin B12 done 3 months back within normal limits.  Also check anemia panel.  Patient needs GI evaluation.  Since his hemoglobin has remained stable over 9 posttransfusion, what ever GI bleed, if he had any seems to have stopped.  Primary service could consider consulting GI services either during this hospitalization or as outpatient at per primary service's discretion.  CKD stage III.  Will closely monitor.  BMP.  GERD.  We will continue with Protonix.  DVT prophylaxis: SCD Code Status: Full code Family Communication:  None present at bedside.  Plan of care discussed with patient in length and he verbalized understanding and agreed with it. Disposition Plan: Per primary service  Estimated body mass index is 22.27 kg/m as calculated from the following:   Height as of this encounter: 5\' 11"  (1.803 m).   Weight as of this encounter: 72.4 kg.      Nutritional status:  Nutrition Problem: Increased nutrient needs Etiology:  acute illness   Signs/Symptoms: estimated needs   Interventions: MVI, Ensure Enlive (each supplement provides 350kcal and 20 grams of protein)    Consultants:   [facility]  Cardiology  Procedures:   lumbar laminectomy and decompression on L3-L4 level by Dr. Dellis Filbert on 02/05/2019  Antimicrobials:   None   Subjective: Seen and examined.  He  has no complaints.  Objective: Vitals:   02/09/19 0503 02/09/19 1449 02/09/19 2115 02/10/19 0556  BP: (!) 144/62 (!) 153/74 (!) 113/54 (!) 112/57  Pulse: 79 85 86 77  Resp: 16 16 16 16   Temp: 98.4 F (36.9 C) 98.1 F (36.7 C) 99.3 F (37.4 C) 98.2 F (36.8 C)  TempSrc: Oral Oral    SpO2: 97% 100% 96% 97%  Weight:      Height:        Intake/Output Summary (Last 24 hours) at 02/10/2019 1318 Last data filed at 02/10/2019 0630 Gross per 24 hour  Intake 350 ml  Output 1425 ml  Net -1075 ml   Filed Weights   02/05/19 1306  Weight: 72.4 kg    Examination:  General exam: Appears calm and comfortable  Respiratory system: Clear to auscultation. Respiratory effort normal. Cardiovascular system: S1 & S2 heard, RRR. No JVD, murmurs, rubs, gallops or clicks. No pedal edema. Gastrointestinal system: Abdomen is nondistended, soft and nontender. No organomegaly or masses felt. Normal bowel sounds heard. Central nervous system: Alert and oriented. No focal neurological deficits. Extremities: Symmetric 5 x 5 power. Skin: No rashes, lesions or ulcers Psychiatry: Judgement and insight appear normal. Mood & affect appropriate.    Data Reviewed: I have personally reviewed following labs and imaging studies  CBC: Recent Labs  Lab 02/04/19 1400 02/07/19 2252 02/08/19 1021 02/09/19 0820 02/10/19 0323  WBC 7.5 9.4  --  9.5 8.9  NEUTROABS 4.8  --   --   --   --   HGB 11.5* 6.8* 9.0* 9.6* 9.6*  HCT 37.1* 21.3* 28.0* 30.3* 30.1*  MCV 102.8* 103.9*  --  97.1 95.3  PLT 249 167  --  181 [id number]   Basic Metabolic Panel: Recent Labs  Lab 02/04/19 1400 02/09/19 0820 02/09/19 0822 02/10/19 0323  NA 140 139  --  138  K 4.1 3.6  --  3.4*  CL 105 104  --  103  CO2 26 25  --  26  GLUCOSE 107* 123*  --  120*  BUN 21 27*  --  26*  CREATININE 1.36* 1.31*  --  1.28*  CALCIUM 9.4 8.8*  --  8.7*  MG  --   --  1.9 2.0   GFR: Estimated Creatinine Clearance: 40.9 mL/min (A) (by C-G formula  based on SCr of 1.28 mg/dL (H)). Liver Function Tests: Recent Labs  Lab 02/04/19 1400  AST 27  ALT 18  ALKPHOS 59  BILITOT 1.3*  PROT 7.1  ALBUMIN 4.5   No results for input(s): LIPASE, AMYLASE in the last 168 hours. No results for input(s): AMMONIA in the last 168 hours. Coagulation Profile: Recent Labs  Lab 02/04/19 1400  INR 1.0   Cardiac Enzymes: No results for input(s): CKTOTAL, CKMB, CKMBINDEX, TROPONINI in the last 168 hours. BNP (last 3 results) No results for input(s): PROBNP in the last 8760 hours. HbA1C: No results for input(s): HGBA1C in the last 72 hours. CBG: No results for input(s): GLUCAP in the last 168 hours. Lipid Profile: No results for input(s): CHOL, HDL, LDLCALC, TRIG, CHOLHDL, LDLDIRECT in the last 72 hours.  Thyroid Function Tests: No results for input(s): TSH, T4TOTAL, FREET4, T3FREE, THYROIDAB in the last 72 hours. Anemia Panel: Recent Labs    02/09/19 0820 02/09/19 0857  VITAMINB12  --  552  FOLATE  --  9.2  FERRITIN  --  37  TIBC  --  249*  IRON  --  22*  RETICCTPCT 1.8  --    Sepsis Labs: No results for input(s): PROCALCITON, LATICACIDVEN in the last 168 hours.  Recent Results (from the past 240 hour(s))  Novel Coronavirus, NAA (Hosp order, Send-out to Ref Lab; TAT 18-24 hrs     Status: None   Collection Time: 02/02/19  2:37 PM   Specimen: Nasopharyngeal Swab; Respiratory  Result Value Ref Range Status   SARS-CoV-2, NAA NOT DETECTED NOT DETECTED Final    Comment: (NOTE) This nucleic acid amplification test was developed and its performance characteristics determined by [employer]. Nucleic acid amplification tests include PCR and TMA. This test has not been FDA cleared or approved. This test has been authorized by FDA under an Emergency Use Authorization (EUA). This test is only authorized for the duration of time the declaration that circumstances exist justifying the authorization of the emergency use of in vitro  diagnostic tests for detection of SARS-CoV-2 virus and/or diagnosis of COVID-19 infection under section 564(b)(1) of the Act, 21 U.S.C. [id number]) (1), unless the authorization is terminated or revoked sooner. When diagnostic testing is negative, the possibility of a false negative result should be considered in the context of a patient's recent exposures and the presence of clinical signs and symptoms consistent with COVID-19. An individual without symptoms of COVID- 19 and who is not shedding SARS-CoV-2 vi rus would expect to have a negative (not detected) result in this assay. Performed At: [facility] [address] [place], [place] [id number] Rush Farmer MD [id number]    [place]  Final    Comment: Performed at [facility], [place] [address] [place], [place] [address]  Surgical pcr screen     Status: None   Collection Time: 02/05/19 12:39 PM   Specimen: Nasal Mucosa; Nasal Swab  Result Value Ref Range Status   MRSA, PCR NEGATIVE NEGATIVE Final   Staphylococcus aureus NEGATIVE NEGATIVE Final    Comment: (NOTE) The Xpert SA Assay (FDA approved for NASAL specimens in patients 63 years of age and older), is one component of a comprehensive surveillance program. It is not intended to diagnose infection nor to guide or monitor treatment. Performed at [facility] [place], [place] [address], [place], [place] [address]       Radiology Studies: Dg Ankle 2 Views Right  Result Date: 02/10/2019 CLINICAL DATA:  Diffuse RIGHT ankle pain and swelling, mid RIGHT foot pain and swelling EXAM: RIGHT ANKLE - 2 VIEW COMPARISON:  None FINDINGS: Osseous demineralization. Ankle joint space preserved. Tiny plantar calcaneal spur. No acute fracture, dislocation, or bone destruction. Minimal vascular calcification and few small calcified phleboliths at distal lower leg. IMPRESSION: No acute osseous abnormalities. Electronically Signed   By: Lavonia Dana M.D.   On: 02/10/2019 08:43   Dg Foot 2 Views Right  Result Date: 02/10/2019 CLINICAL DATA:  Diffuse RIGHT ankle pain and swelling, mid RIGHT foot pain and swelling EXAM: RIGHT FOOT - 2 VIEW COMPARISON:  None FINDINGS: Osseous demineralization. Joint space narrowing at first MTP joint. Remaining joint spaces preserved. Tiny plantar calcaneal spur. Mild soft tissue swelling at dorsum of foot. No acute fracture, dislocation, or bone  destruction. IMPRESSION: Degenerative changes first MTP joint. No acute osseous abnormalities. Electronically Signed   By: Lavonia Dana M.D.   On: 02/10/2019 08:44    Scheduled Meds: . bupivacaine liposome  20 mL Infiltration Once  . feeding supplement (ENSURE ENLIVE)  237 mL Oral BID BM  . furosemide  40 mg Oral Daily  . mouth rinse  15 mL Mouth Rinse BID  . metoCLOPramide (REGLAN) injection  10 mg Intravenous Q6H  . multivitamin with minerals  1 tablet Oral Daily  . pantoprazole  40 mg Oral Daily   Continuous Infusions: . lactated ringers 100 mL/hr at 02/06/19 2148  . methocarbamol (ROBAXIN) IV    . phenylephrine       LOS: 3 days   Time spent: 30 minutes   Darliss Cheney, MD [facility]  02/10/2019, 1:18 PM   To contact the attending provider between 7A-7P or the covering provider during after hours 7P-7A, please log into the web site [email or web address] and use password [email or web address].

## 2019-02-10 NOTE — Progress Notes (Addendum)
RN reviewed discharge instructions with patient and son. All questions answered.   NT rolled patient down to family car.   RN called about BSC that was left on cart.   No answer at 1355. Will call again.   [place]emore, Therapist, sports

## 2019-02-10 NOTE — Plan of Care (Signed)
  Problem: Education: Goal: Ability to verbalize activity precautions or restrictions will improve Outcome: Progressing Goal: Knowledge of the prescribed therapeutic regimen will improve Outcome: Progressing   Problem: Activity: Goal: Ability to tolerate increased activity will improve Outcome: Progressing Goal: Will remain free from falls Outcome: Progressing   Problem: Pain Management: Goal: Pain level will decrease Outcome: Progressing   Problem: Bladder/Genitourinary: Goal: Urinary functional status for postoperative course will improve Outcome: Progressing   Problem: Clinical Measurements: Goal: Will remain free from infection Outcome: Progressing Goal: Respiratory complications will improve Outcome: Progressing Goal: Cardiovascular complication will be avoided Outcome: Progressing   Problem: Elimination: Goal: Will not experience complications related to bowel motility Outcome: Progressing Goal: Will not experience complications related to urinary retention Outcome: Progressing   Problem: Safety: Goal: Ability to remain free from injury will improve Outcome: Progressing

## 2019-02-10 NOTE — Progress Notes (Signed)
Physical Therapy Treatment Patient Details Name: Jon Reid MRN: [id number] DOB: 1928/06/05 Today's Date: 02/10/2019    History of Present Illness Pt is an 85 year old male s/p LUMBAR LAMINECTOMY/DECOMPRESSION L3-L4 due to spinal stenosis with PMHx of CABG x4, CAD, right BBB, HTN    PT Comments    POD #5 am session. Pt still c/o R foot/toe pain, reporting no back pain at this time. General bed mobility comments: Pt OOB in recliner and returned to recliner after session General transfer comment: Pt required VC's to push up from the arm rests and not pull up with the RW. Pt needs CGA to help with steadying once standing General Gait Details: Pt was able to ambulate 22ft with RW. Pt was limited by R foot pain. Pt shuffling gets worse when turning around or turning to sit in a chair. Pt stride length has significatly decreased from yesterday. Pt back precautions have been reviewed and a handout has been given. We will return this afternoon to go over everything with son including precautions, log rolling and any other questions.   Follow Up Recommendations  Follow surgeon's recommendation for DC plan and follow-up therapies     Equipment Recommendations       Recommendations for Other Services       Precautions / Restrictions Precautions Precautions: Fall;Back Precaution Comments: reviewed precautions with pt Restrictions Weight Bearing Restrictions: No Other Position/Activity Restrictions: avoid bending lifting twisting, arching    Mobility  Bed Mobility Overal bed mobility: Needs Assistance Bed Mobility: Sidelying to Sit;Rolling Rolling: Min assist Sidelying to sit: Min assist;Mod assist       General bed mobility comments: Pt OOB in recliner and returned to recliner after session  Transfers Overall transfer level: Needs assistance Equipment used: Rolling walker (2 wheeled) Transfers: Sit to/from Stand Sit to Stand: Min assist         General transfer comment: Pt  required VC's to push up from the arm rests and not pull up with the RW. Pt needs CGA to help with steadying once standing  Ambulation/Gait Ambulation/Gait assistance: Min assist Gait Distance (Feet): 50 Feet Assistive device: Rolling walker (2 wheeled) Gait Pattern/deviations: Decreased stride length;Trunk flexed;Shuffle;Step-to pattern     General Gait Details: Pt was able to ambulate 10ft with RW. Pt was limited by R foot pain. Pt shuffling gets worse when turning around or turning to sit in a chair. Pt stride length has significatly decreased from yesterday   Stairs             Wheelchair Mobility    Modified Rankin (Stroke Patients Only)       Balance Overall balance assessment: History of Falls Sitting-balance support: Bilateral upper extremity supported Sitting balance-Leahy Scale: Fair     Standing balance support: Bilateral upper extremity supported;During functional activity Standing balance-Leahy Scale: Poor                              Cognition Arousal/Alertness: Awake/alert Behavior During Therapy: WFL for tasks assessed/performed Overall Cognitive Status: Within Functional Limits for tasks assessed                                        Exercises      General Comments        Pertinent Vitals/Pain Pain Assessment: 0-10 Pain Score: 7  Pain Location:  R foot/ankle pain (no pain in back) Pain Descriptors / Indicators: Discomfort;Sore;Aching Pain Intervention(s): Monitored during session;Repositioned;Ice applied    Home Living                      Prior Function            PT Goals (current goals can now be found in the care plan section) Progress towards PT goals: Progressing toward goals    Frequency    Min 5X/week      PT Plan Current plan remains appropriate    Co-evaluation              AM-PAC PT "6 Clicks" Mobility   Outcome Measure  Help needed turning from your back to your  side while in a flat bed without using bedrails?: A Little Help needed moving from lying on your back to sitting on the side of a flat bed without using bedrails?: A Little Help needed moving to and from a bed to a chair (including a wheelchair)?: A Lot Help needed standing up from a chair using your arms (e.g., wheelchair or bedside chair)?: A Little Help needed to walk in hospital room?: A Little Help needed climbing 3-5 steps with a railing? : A Lot 6 Click Score: 16    End of Session Equipment Utilized During Treatment: Gait belt Activity Tolerance: Patient tolerated treatment well Patient left: in chair;with call bell/phone within reach;with chair alarm set Nurse Communication: Mobility status PT Visit Diagnosis: Muscle weakness (generalized) (M62.81);Difficulty in walking, not elsewhere classified (R26.2)     Time: [id number] PT Time Calculation (min) (ACUTE ONLY): 26 min  Charges:  $Gait Training: 8-22 mins $Therapeutic Activity: 8-22 mins                     Excell Seltzer, [facility]

## 2019-02-10 NOTE — Progress Notes (Signed)
Subjective: 5 Days Post-Op Procedure(s) (LRB): LUMBAR LAMINECTOMY/DECOMPRESSION L3-L4 (N/A) Patient reports pain as 1 on 0-10 scale. Doing well today except some pain in his right foot. He twisted his foot getting into bed. His foot exam shows diffuse pain over the midfoot. Xrays ordered. I called a GI consult to discuss hispositive Stool Guiac situation. Two consectutive HBG have been stable at 9.6. He is asymptomatic and dos not want to stay for any further tests.Since this is the case he was instructed to see his private  MD as an outpatient. Case discussed with DR. Schooler.GI and he recommends to follow as an outpatient since his HBG is stable head injury vital signs are stable.Will DC on iron and with the understanding that he follow up with his Private MD.He is cleared by cardiology as well.  Objective: Vital signs in last 24 hours: Temp:  [98.1 F (36.7 C)-99.3 F (37.4 C)] 98.2 F (36.8 C) (11/24 0556) Pulse Rate:  [77-86] 77 (11/24 0556) Resp:  [16] 16 (11/24 0556) BP: (112-153)/(54-74) 112/57 (11/24 0556) SpO2:  [96 %-100 %] 97 % (11/24 0556)  Intake/Output from previous day: 11/23 0701 - 11/24 0700 In: 220 [P.O.:220] Out: 1900 [Urine:1900] Intake/Output this shift: No intake/output data recorded.  Recent Labs    02/07/19 2252 02/08/19 1021 02/09/19 0820 02/10/19 0323  HGB 6.8* 9.0* 9.6* 9.6*   Recent Labs    02/09/19 0820 02/10/19 0323  WBC 9.5 8.9  RBC 3.12*  3.10* 3.16*  HCT 30.3* 30.1*  PLT 181 209   Recent Labs    02/09/19 0820 02/10/19 0323  NA 139 138  K 3.6 3.4*  CL 104 103  CO2 25 26  BUN 27* 26*  CREATININE 1.31* 1.28*  GLUCOSE 123* 120*  CALCIUM 8.8* 8.7*   No results for input(s): LABPT, INR in the last 72 hours.  Neurologically intact Dorsiflexion/Plantar flexion intact   Assessment/Plan: 5 Days Post-Op Procedure(s) (LRB): LUMBAR LAMINECTOMY/DECOMPRESSION L3-L4 (N/A) Up with therapy.Will DC after I review his Xrays. Will DC on  Iron as well.      Latanya Maudlin 02/10/2019, 7:21 AM

## 2019-02-10 NOTE — Progress Notes (Signed)
Barrett, Evelene Croon, PA-C  Physician Assistant Certified  Cardiology  Progress Notes  Signed  Date of Service:  02/09/2019 5:25 PM          Signed      Expand All Collapse All    Show:Clear all [x] Manual[x] Template[x] Copied  Added by: [x] Barrett, Evelene Croon, PA-C  [] Hover for details   Progress Note  Patient Name: Jon Reid Date of Encounter: 02/10/2019  Primary Cardiologist:  Minus Breeding, MD  Subjective   Feels a little stronger today, yesterday was too weak to hold up his phone.  Inpatient Medications    Scheduled Meds: . bupivacaine liposome  20 mL Infiltration Once  . feeding supplement (ENSURE ENLIVE)  237 mL Oral BID BM  . furosemide  40 mg Oral Daily  . mouth rinse  15 mL Mouth Rinse BID  . metoCLOPramide (REGLAN) injection  10 mg Intravenous Q6H  . multivitamin with minerals  1 tablet Oral Daily  . pantoprazole  40 mg Oral Daily  . potassium chloride  40 mEq Oral Once   Continuous Infusions: . lactated ringers 100 mL/hr at 02/06/19 2148  . methocarbamol (ROBAXIN) IV    . phenylephrine     PRN Meds: acetaminophen **OR** acetaminophen, bisacodyl, fluticasone, HYDROcodone-acetaminophen, HYDROcodone-acetaminophen, HYDROmorphone (DILAUDID) injection, menthol-cetylpyridinium **OR** phenol, methocarbamol **OR** methocarbamol (ROBAXIN) IV, nitroGLYCERIN, ondansetron **OR** ondansetron (ZOFRAN) IV, polyethylene glycol   Vital Signs          Vitals:   02/09/19 0503 02/09/19 1449 02/09/19 2115 02/10/19 0556  BP: (!) 144/62 (!) 153/74 (!) 113/54 (!) 112/57  Pulse: 79 85 86 77  Resp: 16 16 16 16   Temp: 98.4 F (36.9 C) 98.1 F (36.7 C) 99.3 F (37.4 C) 98.2 F (36.8 C)  TempSrc: Oral Oral    SpO2: 97% 100% 96% 97%  Weight:      Height:        Intake/Output Summary (Last 24 hours) at 02/10/2019 1009 Last data filed at 02/10/2019 0630    Gross per 24 hour  Intake 470 ml  Output 1725 ml  Net -1255 ml      Filed  Weights   02/05/19 1306  Weight: 72.4 kg            Last Weight  Most recent update: 02/05/2019  1:06 PM           Weight  72.4 kg (159 lb 11.2 oz)            Weight change:    Telemetry    Not on - Personally Reviewed  ECG    None today - Personally Reviewed  Physical Exam   General: Well developed, well nourished, male appearing in no acute distress. Head: Normocephalic, atraumatic.      Neck: Supple without bruits, JVD 8-9 cm. Lungs:  Resp regular and unlabored, CTA. Heart: RRR, S1, S2, no S3, S4, 2/6 murmur; no rub. Abdomen: Soft, non-tender, non-distended with normoactive bowel sounds. No hepatomegaly. No rebound/guarding. No obvious abdominal masses. Extremities: No clubbing, cyanosis, no edema. Distal pedal pulses are 2+ bilaterally. Neuro: Alert and oriented X 3. Moves all extremities spontaneously. Psych: Normal affect.  Labs    Hematology Last Labs         Recent Labs  Lab 02/07/19 2252 02/08/19 1021 02/09/19 0820 02/10/19 0323  WBC 9.4  --  9.5 8.9  RBC 2.05*  --  3.12*  3.10* 3.16*  HGB 6.8* 9.0* 9.6* 9.6*  HCT 21.3* 28.0* 30.3* 30.1*  MCV 103.9*  --  97.1 95.3  MCH 33.2  --  30.8 30.4  MCHC 31.9  --  31.7 31.9  RDW 13.9  --  18.5* 17.9*  PLT 167  --  181 209      Chemistry Last Labs        Recent Labs  Lab 02/04/19 1400 02/09/19 0820 02/10/19 0323  NA 140 139 138  K 4.1 3.6 3.4*  CL 105 104 103  CO2 26 25 26   GLUCOSE 107* 123* 120*  BUN 21 27* 26*  CREATININE 1.36* 1.31* 1.28*  CALCIUM 9.4 8.8* 8.7*  PROT 7.1  --   --   ALBUMIN 4.5  --   --   AST 27  --   --   ALT 18  --   --   ALKPHOS 59  --   --   BILITOT 1.3*  --   --   GFRNONAA 46* 48* 50*  GFRAA 53* 56* 58*  ANIONGAP 9 10 9        High Sensitivity Troponin:   Last Labs        Recent Labs  Lab 02/07/19 2252 02/08/19 1021 02/09/19 0820  TROPONINIHS 24* 94* 57*       Recent Labs       Lab Results  Component Value Date   CHOL 148  05/09/2018   HDL 62 05/09/2018   LDLCALC 76 05/09/2018   TRIG 50 05/09/2018   CHOLHDL 2.4 05/09/2018     Recent Labs       Lab Results  Component Value Date   IRON 22 (L) 02/09/2019   TIBC 249 (L) 02/09/2019   FERRITIN 37 02/09/2019      Radiology    Dg Ankle 2 Views Right  Result Date: 02/10/2019 CLINICAL DATA:  Diffuse RIGHT ankle pain and swelling, mid RIGHT foot pain and swelling EXAM: RIGHT ANKLE - 2 VIEW COMPARISON:  None FINDINGS: Osseous demineralization. Ankle joint space preserved. Tiny plantar calcaneal spur. No acute fracture, dislocation, or bone destruction. Minimal vascular calcification and few small calcified phleboliths at distal lower leg. IMPRESSION: No acute osseous abnormalities. Electronically Signed   By: Lavonia Dana M.D.   On: 02/10/2019 08:43   Dg Foot 2 Views Right  Result Date: 02/10/2019 CLINICAL DATA:  Diffuse RIGHT ankle pain and swelling, mid RIGHT foot pain and swelling EXAM: RIGHT FOOT - 2 VIEW COMPARISON:  None FINDINGS: Osseous demineralization. Joint space narrowing at first MTP joint. Remaining joint spaces preserved. Tiny plantar calcaneal spur. Mild soft tissue swelling at dorsum of foot. No acute fracture, dislocation, or bone destruction. IMPRESSION: Degenerative changes first MTP joint. No acute osseous abnormalities. Electronically Signed   By: Lavonia Dana M.D.   On: 02/10/2019 08:44     Cardiac Studies   ECHO:  02/09/2019 1. Left ventricular ejection fraction, by visual estimation, is 60 to 65%. The left ventricle has normal function. There is mildly increased left ventricular hypertrophy. 2. The left ventricle has no regional wall motion abnormalities. 3. Global right ventricle has normal systolic function.The right ventricular size is normal. No increase in right ventricular wall thickness. 4. Left atrial size was normal. 5. There is a mass seen just behind the left atrium, somewhat impinging on it. Favor hiatal  hernia, recommend clinical correlation. 6. Right atrial size was normal. 7. Mild to moderate mitral annular calcification. 8. The mitral valve is degenerative. Trace mitral valve regurgitation. 9. The tricuspid valve is normal in structure. Tricuspid valve regurgitation is trivial. 10. The aortic  valve is tricuspid. Aortic valve regurgitation is trivial. Mild aortic valve stenosis. Aortic valve mean gradient measures 17.0 mmHg. Aortic valve peak gradient measures 28.1 mmHg. 11. There is Moderate calcification of the aortic valve. 12. There is Moderate thickening of the aortic valve. 13. The pulmonic valve was grossly normal. Pulmonic valve regurgitation is trivial. 14. Mildly elevated pulmonary artery systolic pressure.  Patient Profile     85 y.o. male w/ hx CABG 2014, neg MV 2017, HTN, mod AS, RBBB/LAFB, GERD, CKD II, prostate CA, spinal stenosiswas admitted 11/19 for lumbar laminectomy/decompression andwas seen 11/23 by cards for the evaluation of chest pain & elevated troponin.  Assessment & Plan    1. Chest pain, elevated troponin - ECG w/ no acute changes - echo results above, EF normal w/ no WMA - no ongoing ischemic sx - no further workup needed - f/u as outpt  2. Echo results - ?mass behind L atrium, possibly HH, MD advise on eval - mild AS, gradients above - follow AS clinically, no volume overload by exam, no presyncope or syncope  3. Anemia, +FOB - H&H stable after transfusion - +iron deficiency - per IM/Dr Gioffre, but agree that he needs GI eval   Otherwise, per Dr Keene Breath Principal Problem:   Spinal stenosis, lumbar region with neurogenic claudication Active Problems:   Benign essential HTN   Myelodysplastic syndrome   Hx of CABG   Coronary artery disease involving native coronary artery of native heart without angina pectoris   Gastroesophageal reflux disease with esophagitis   Elevated troponin   Fecal occult blood test positive    SignedRosaria Ferries , PA-C 10:09 AM 02/10/2019 Pager: [phone]         As above, patient seen and examined.  Patient denies chest pain or dyspnea today.  Troponin minimally elevated but in the setting of anemia and likely related to demand ischemia.  Follow-up echocardiogram showed normal LV function and mild aortic stenosis.  I do not think he needs further inpatient evaluation from a cardiac standpoint.  Once he recovers from surgery it may be worthwhile to proceed with a nuclear study for risk stratification.  He will follow up with Dr. Percival Spanish for this issue.  Echo notes possible mass behind left atrium question secondary to hiatal hernia.  This can be followed up as an outpatient.  Cardiology will sign off.  Please call with questions.  Would resume aspirin at time of discharge. Jon Ruths, MD

## 2019-02-10 NOTE — Progress Notes (Signed)
Physical Therapy Treatment Patient Details Name: Jon Reid MRN: [id number] DOB: April 17, 1928 Today's Date: 02/10/2019    History of Present Illness Pt is an 85 year old male s/p LUMBAR LAMINECTOMY/DECOMPRESSION L3-L4 due to spinal stenosis with PMHx of CABG x4, CAD, right BBB, HTN    PT Comments    POD #5 pm session. Pt's son was present for treatment. Pt and son were educated on back precautions, log rolling, and proper guarding. General bed mobility comments: Pt son was present and trained on how to perform log rolling. Pt and son required VC's to complete the log roll in and out of bed. General transfer comment: Pt required VC's on pushing up from armrests. Son showed proper way to guard patient and instructed not to pull on pt arms/hands to help pt stand General Gait Details: Pt was able to ambulate 78ft with a RW. Pt was still limited by R foot pain. Pt shuffling when turning has gotten worse, Pt son shown proper way to guard pt while walking. Pt gait speed was decreased. All questions were asked and answered.   Follow Up Recommendations  Follow surgeon's recommendation for DC plan and follow-up therapies     Equipment Recommendations       Recommendations for Other Services       Precautions / Restrictions Precautions Precautions: Fall;Back Precaution Comments: reviewed precautions with pt Restrictions Weight Bearing Restrictions: No Other Position/Activity Restrictions: avoid bending lifting twisting, arching    Mobility  Bed Mobility Overal bed mobility: Needs Assistance Bed Mobility: Sidelying to Sit;Rolling Rolling: Min assist Sidelying to sit: Min assist     Sit to sidelying: Min assist General bed mobility comments: Pt son was present and trained on how to perform log rolling. Pt and son required VC's to complete the log roll in and out of bed.  Transfers Overall transfer level: Needs assistance Equipment used: Rolling walker (2 wheeled) Transfers: Sit  to/from Stand Sit to Stand: Min assist         General transfer comment: Pt required VC's on pushing up from armrests. Son showed proper way to guard patient and instructed not to pull on pt arms/hands to help pt stand  Ambulation/Gait Ambulation/Gait assistance: Min assist Gait Distance (Feet): 32 Feet Assistive device: Rolling walker (2 wheeled) Gait Pattern/deviations: Decreased stride length;Trunk flexed;Shuffle;Step-to pattern     General Gait Details: Pt was able to ambulate 3ft with a RW. Pt was still limited by R foot pain. Pt shuffling when turning has gotten worse, Pt son shown proper way to guard pt while walking. Pt gait speed was decreased.   Stairs             Wheelchair Mobility    Modified Rankin (Stroke Patients Only)       Balance Overall balance assessment: History of Falls Sitting-balance support: Bilateral upper extremity supported Sitting balance-Leahy Scale: Fair     Standing balance support: Bilateral upper extremity supported;During functional activity Standing balance-Leahy Scale: Poor                              Cognition Arousal/Alertness: Awake/alert Behavior During Therapy: WFL for tasks assessed/performed Overall Cognitive Status: Within Functional Limits for tasks assessed                                        Exercises  General Comments        Pertinent Vitals/Pain Pain Assessment: 0-10 Pain Score: 7  Pain Location: R foot/ankle pain (no pain in back) Pain Descriptors / Indicators: Discomfort;Sore;Aching Pain Intervention(s): Monitored during session;Repositioned;Ice applied    Home Living                      Prior Function            PT Goals (current goals can now be found in the care plan section) Progress towards PT goals: Progressing toward goals    Frequency    Min 5X/week      PT Plan Current plan remains appropriate    Co-evaluation               AM-PAC PT "6 Clicks" Mobility   Outcome Measure  Help needed turning from your back to your side while in a flat bed without using bedrails?: A Little Help needed moving from lying on your back to sitting on the side of a flat bed without using bedrails?: A Little Help needed moving to and from a bed to a chair (including a wheelchair)?: A Lot Help needed standing up from a chair using your arms (e.g., wheelchair or bedside chair)?: A Little Help needed to walk in hospital room?: A Little Help needed climbing 3-5 steps with a railing? : A Lot 6 Click Score: 16    End of Session Equipment Utilized During Treatment: Gait belt Activity Tolerance: Patient tolerated treatment well Patient left: in chair;with call bell/phone within reach;with chair alarm set;with family/visitor present Nurse Communication: Mobility status PT Visit Diagnosis: Muscle weakness (generalized) (M62.81);Difficulty in walking, not elsewhere classified (R26.2)     Time: [id number] PT Time Calculation (min) (ACUTE ONLY): 25 min  Charges:  $Gait Training: 8-22 mins $Therapeutic Activity: 8-22 mins                     Excell Seltzer, [facility]

## 2019-02-10 NOTE — Progress Notes (Signed)
Occupational Therapy Treatment Patient Details Name: Jon Reid MRN: [id number] DOB: June 12, 1928 Today's Date: 02/10/2019    History of present illness Pt is an 85 year old male s/p LUMBAR LAMINECTOMY/DECOMPRESSION L3-L4 due to spinal stenosis with PMHx of CABG x4, CAD, right BBB, HTN   OT comments  Pt will need significant A with ADL activity. Pt states grandson will A as needed.  Spoke with RN regarding this as well.   Follow Up Recommendations  Home health OT;Supervision/Assistance - 24 hour    Equipment Recommendations  3 in 1 bedside commode    Recommendations for Other Services      Precautions / Restrictions Precautions Precautions: Fall;Back Precaution Comments: reviewed precautions with pt Restrictions Other Position/Activity Restrictions: avoid bending lifting twisting, arching       Mobility Bed Mobility Overal bed mobility: Needs Assistance Bed Mobility: Sidelying to Sit;Rolling Rolling: Min assist Sidelying to sit: Min assist;Mod assist       General bed mobility comments: Pt required VC's to perform a log roll and HHA to sit up from sidelying and scoot out to the EOB  Transfers Overall transfer level: Needs assistance Equipment used: Rolling walker (2 wheeled) Transfers: Sit to/from Stand Sit to Stand: Min assist;From elevated surface         General transfer comment: VC for hand placement    Balance Overall balance assessment: History of Falls Sitting-balance support: Bilateral upper extremity supported Sitting balance-Leahy Scale: Fair     Standing balance support: Bilateral upper extremity supported;During functional activity Standing balance-Leahy Scale: Poor                             ADL either performed or assessed with clinical judgement   ADL Overall ADL's : Needs assistance/impaired Eating/Feeding: Set up;Sitting   Grooming: Oral care;Standing;Minimal assistance;Cueing for safety Grooming Details (indicate cue  type and reason): standing at sink             Lower Body Dressing: Maximal assistance;Minimal assistance   Toilet Transfer: Minimal assistance;Cueing for safety;Stand-pivot   Toileting- Clothing Manipulation and Hygiene: Minimal assistance;Cueing for safety;Cueing for sequencing;Adhering to back precautions;Sit to/from stand         General ADL Comments: pt anxious to go home.     Vision Patient Visual Report: No change from baseline            Cognition Arousal/Alertness: Awake/alert Behavior During Therapy: WFL for tasks assessed/performed Overall Cognitive Status: Within Functional Limits for tasks assessed                                                     Pertinent Vitals/ Pain       Pain Score: 4  Pain Location: R foot/ankle pain (no pain in back) Pain Intervention(s): Monitored during session;Repositioned     Prior Functioning/Environment              Frequency  Min 2X/week        Progress Toward Goals  OT Goals(current goals can now be found in the care plan section)  Progress towards OT goals: Progressing toward goals     Plan Discharge plan remains appropriate       AM-PAC OT "6 Clicks" Daily Activity     Outcome Measure   Help from another person  eating meals?: None Help from another person taking care of personal grooming?: A Little Help from another person toileting, which includes using toliet, bedpan, or urinal?: A Little Help from another person bathing (including washing, rinsing, drying)?: A Lot Help from another person to put on and taking off regular upper body clothing?: A Little Help from another person to put on and taking off regular lower body clothing?: A Lot 6 Click Score: 17    End of Session Equipment Utilized During Treatment: Gait belt;Rolling walker  OT Visit Diagnosis: Muscle weakness (generalized) (M62.81);Unsteadiness on feet (R26.81)   Activity Tolerance Patient tolerated treatment  well   Patient Left in chair;with call bell/phone within reach;with chair alarm set   Nurse Communication Mobility status        Time: 1030-1052 OT Time Calculation (min): 22 min  Charges: OT General Charges $OT Visit: 1 Visit OT Treatments $Self Care/Home Management : 8-22 mins  [name], [place] [phone] Office- [phone]      [name], Edwena Felty D 02/10/2019, 1:32 PM

## 2019-02-11 DIAGNOSIS — I350 Nonrheumatic aortic (valve) stenosis: Secondary | ICD-10-CM | POA: Diagnosis not present

## 2019-02-11 DIAGNOSIS — Z951 Presence of aortocoronary bypass graft: Secondary | ICD-10-CM | POA: Diagnosis not present

## 2019-02-11 DIAGNOSIS — Z79899 Other long term (current) drug therapy: Secondary | ICD-10-CM | POA: Diagnosis not present

## 2019-02-11 DIAGNOSIS — D62 Acute posthemorrhagic anemia: Secondary | ICD-10-CM | POA: Diagnosis not present

## 2019-02-11 DIAGNOSIS — K2100 Gastro-esophageal reflux disease with esophagitis, without bleeding: Secondary | ICD-10-CM | POA: Diagnosis not present

## 2019-02-11 DIAGNOSIS — R7989 Other specified abnormal findings of blood chemistry: Secondary | ICD-10-CM | POA: Diagnosis not present

## 2019-02-11 DIAGNOSIS — I2510 Atherosclerotic heart disease of native coronary artery without angina pectoris: Secondary | ICD-10-CM | POA: Diagnosis not present

## 2019-02-11 DIAGNOSIS — M1990 Unspecified osteoarthritis, unspecified site: Secondary | ICD-10-CM | POA: Diagnosis not present

## 2019-02-11 DIAGNOSIS — M1030 Gout due to renal impairment, unspecified site: Secondary | ICD-10-CM | POA: Diagnosis not present

## 2019-02-11 DIAGNOSIS — R531 Weakness: Secondary | ICD-10-CM | POA: Diagnosis not present

## 2019-02-11 DIAGNOSIS — Z7982 Long term (current) use of aspirin: Secondary | ICD-10-CM | POA: Diagnosis not present

## 2019-02-11 DIAGNOSIS — R262 Difficulty in walking, not elsewhere classified: Secondary | ICD-10-CM | POA: Diagnosis not present

## 2019-02-11 DIAGNOSIS — R195 Other fecal abnormalities: Secondary | ICD-10-CM | POA: Diagnosis not present

## 2019-02-11 DIAGNOSIS — I129 Hypertensive chronic kidney disease with stage 1 through stage 4 chronic kidney disease, or unspecified chronic kidney disease: Secondary | ICD-10-CM | POA: Diagnosis not present

## 2019-02-11 DIAGNOSIS — I4510 Unspecified right bundle-branch block: Secondary | ICD-10-CM | POA: Diagnosis not present

## 2019-02-11 DIAGNOSIS — D469 Myelodysplastic syndrome, unspecified: Secondary | ICD-10-CM | POA: Diagnosis not present

## 2019-02-11 DIAGNOSIS — Z4789 Encounter for other orthopedic aftercare: Secondary | ICD-10-CM | POA: Diagnosis not present

## 2019-02-11 DIAGNOSIS — N183 Chronic kidney disease, stage 3 unspecified: Secondary | ICD-10-CM | POA: Diagnosis not present

## 2019-02-11 DIAGNOSIS — M48062 Spinal stenosis, lumbar region with neurogenic claudication: Secondary | ICD-10-CM | POA: Diagnosis not present

## 2019-02-16 ENCOUNTER — Telehealth: Payer: Self-pay | Admitting: Family Medicine

## 2019-02-16 ENCOUNTER — Ambulatory Visit (INDEPENDENT_AMBULATORY_CARE_PROVIDER_SITE_OTHER): Payer: MEDICARE | Admitting: Physician Assistant

## 2019-02-16 ENCOUNTER — Other Ambulatory Visit: Payer: Self-pay

## 2019-02-16 ENCOUNTER — Ambulatory Visit: Payer: MEDICARE

## 2019-02-16 ENCOUNTER — Encounter: Payer: Self-pay | Admitting: Physician Assistant

## 2019-02-16 VITALS — BP 137/72 | HR 84 | Temp 96.9°F | Ht 71.0 in

## 2019-02-16 DIAGNOSIS — K5903 Drug induced constipation: Secondary | ICD-10-CM

## 2019-02-16 DIAGNOSIS — L03115 Cellulitis of right lower limb: Secondary | ICD-10-CM

## 2019-02-16 DIAGNOSIS — E538 Deficiency of other specified B group vitamins: Secondary | ICD-10-CM

## 2019-02-16 MED ORDER — CEPHALEXIN 250 MG PO CAPS: 500.0000 mg | ORAL_CAPSULE | Freq: Three times a day (TID) | ORAL | 0 refills | Status: DC

## 2019-02-16 MED ORDER — CYANOCOBALAMIN 1000 MCG/ML IJ SOLN
1000.0000 ug | INTRAMUSCULAR | Status: DC
  Administered 2019-02-16: 1000 ug via INTRAMUSCULAR

## 2019-02-16 NOTE — Progress Notes (Signed)
BP 137/72   Pulse 84   Temp (!) 96.9 F (36.1 C) (Temporal)   Ht 5\' 11"  (1.803 m)   SpO2 96%   BMI 22.27 kg/m    Subjective:    Patient ID: Jon Reid, male    DOB: 1928-03-30, 85 y.o.   MRN: [id number]  HPI: Jon Reid is a 85 y.o. male presenting on 02/16/2019 for Foot Pain  The patient comes in accompanied by family member with right lower leg pain and swelling.  He had an x-ray while he was in the hospital for his back surgery.  He would experience a little bit of pain in that foot to begin with.  X-ray was normal.  Chart is reviewed.  He did have back surgery on 02/05/2019.  He states that the pain is somewhat better.  He has not had any significant amount of pain medicine or the muscle relaxant.  The pain in his foot is bothersome now.  He denies any fever or chills.  No shortness of breath.  He is also still dealing with constipation from having had surgery.  He failed MiraLAX.  We have talked about using mag citrate over-the-counter with some suppositories and they are going to try that when they get home.  We will start Keflex 250 mg 3 times a day for 10 days.  Prescription sent to pharmacy.  Since he is having a lot of pain at night have encouraged him to take one of his pain pills for the next 3 months to see if he can have some relief in the spike foot.  Past Medical History:  Diagnosis Date  . Arthritis   . Carcinoma of prostate [facility])    prostate  . CKD (chronic kidney disease) stage 2, GFR 60-89 ml/min   . Coronary artery disease    a. LHC (8/14):  3v CAD => s/p CABG (LIMA-LAD, SVG-OM1, SVG-PDA)  . First degree AV block 01/28/2019   Noted on EKG  . GERD (gastroesophageal reflux disease)   . Gout   . HTN (hypertension)   . Hx of echocardiogram    Echocardiogram 11/13/12: EF 55-60%, normal wall motion, biatrial enlargement, mild AI, mild TR, no pericardial effusion  . Moderate aortic stenosis 12/06/2017   Noted on ECHO  . Right BBB/left ant fasc block  01/28/2019   Noted on EKG  . 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 Sleep apnea    Questionable  . Spinal stenosis    Relevant past medical, surgical, family and social history reviewed and updated as indicated. Interim medical history since our last visit reviewed. Allergies and medications reviewed and updated. DATA REVIEWED: CHART IN EPIC  Family History reviewed for pertinent findings.  Review of Systems  Constitutional: Negative.  Negative for appetite change, fatigue and fever.  Eyes: Negative for pain and visual disturbance.  Respiratory: Negative.  Negative for cough, chest tightness, shortness of breath and wheezing.   Cardiovascular: Positive for leg swelling. Negative for chest pain and palpitations.  Gastrointestinal: Negative.   Genitourinary: Negative.   Skin: Positive for color change. Negative for rash.  Neurological: Negative.  Negative for weakness, numbness and headaches.  Psychiatric/Behavioral: Negative.     Allergies as of 02/16/2019      Reactions   Crestor [rosuvastatin]    GYNECOMASTIA AND SWELLING      Medication List       Accurate as of February 16, 2019  3:57 PM. If you have any questions, ask your nurse  or doctor.        aspirin EC 81 MG tablet Take 81 mg by mouth daily.   cephALEXin 250 MG capsule Commonly known as: KEFLEX Take 2 capsules (500 mg total) by mouth 3 (three) times daily. Started by: Terald Sleeper, PA-C   diclofenac sodium 1 % Gel Commonly known as: VOLTAREN Apply 2 g topically 4 (four) times daily.   ferrous sulfate 325 (65 FE) MG EC tablet Take 1 tablet (325 mg total) by mouth 2 (two) times daily for 30 doses.   fluticasone 50 MCG/ACT nasal spray Commonly known as: FLONASE One to 2 sprays each nostril at bedtime What changed:   how much to take  how to take this  when to take this  reasons to take this  additional instructions   furosemide 40 MG tablet Commonly known as: LASIX Take 1 tablet (40 mg total) by mouth  daily.   HYDROcodone-acetaminophen 5-325 MG tablet Commonly known as: NORCO/VICODIN Take 1 tablet by mouth every 4 (four) hours as needed for moderate pain ((score 4 to 6)).   methocarbamol 500 MG tablet Commonly known as: ROBAXIN Take 1 tablet (500 mg total) by mouth every 6 (six) hours as needed for muscle spasms.   nitroGLYCERIN 0.4 MG SL tablet Commonly known as: NITROSTAT Place 1 tablet (0.4 mg total) under the tongue every 5 (five) minutes as needed for chest pain.   pantoprazole 40 MG tablet Commonly known as: PROTONIX Take 1 tablet (40 mg total) by mouth daily. For stomach   triamcinolone cream 0.1 % Commonly known as: KENALOG Apply 1 application topically 2 (two) times daily. What changed:   when to take this  reasons to take this   Vitamin D 50 MCG (2000 UT) Caps Take 2,000 Units by mouth daily.          Objective:    BP 137/72   Pulse 84   Temp (!) 96.9 F (36.1 C) (Temporal)   Ht 5\' 11"  (1.803 m)   SpO2 96%   BMI 22.27 kg/m   Allergies  Allergen Reactions  . Crestor [Rosuvastatin]     GYNECOMASTIA AND SWELLING    Wt Readings from Last 3 Encounters:  02/05/19 159 lb 11.2 oz (72.4 kg)  02/04/19 159 lb 11.2 oz (72.4 kg)  01/28/19 163 lb (73.9 kg)    Physical Exam Vitals signs and nursing note reviewed.  Constitutional:      General: He is not in acute distress.    Appearance: He is well-developed.  HENT:     Head: Normocephalic and atraumatic.  Eyes:     Conjunctiva/sclera: Conjunctivae normal.     Pupils: Pupils are equal, round, and reactive to light.  Cardiovascular:     Rate and Rhythm: Normal rate and regular rhythm.     Heart sounds: Normal heart sounds.  Pulmonary:     Effort: Pulmonary effort is normal. No respiratory distress.     Breath sounds: Normal breath sounds.  Musculoskeletal:     Right lower leg: 1+ Edema present.  Skin:    General: Skin is warm and dry.     Findings: Erythema present. No abscess, laceration, rash  or wound.       Psychiatric:        Behavior: Behavior normal.         Assessment & Plan:   1. Cellulitis of right lower extremity Gently wrap foot from toes to knee with ACE bandage, if gets too tight, loosen. Compression stockings  are too tight for this leg.  - cephALEXin (KEFLEX) 250 MG capsule; Take 2 capsules (500 mg total) by mouth 3 (three) times daily.  Dispense: 30 capsule; Refill: 0  2. Vitamin B12 deficiency Update injection  3. Drug-induced constipation Mag citrate OTC and suppositories.  failed miralax   Continue all other maintenance medications as listed above.  Follow up plan: No follow-ups on file.  Educational handout given for cellulitis  Terald Sleeper PA-C [facility] [address]  [place], [place] [address] [phone]   02/16/2019, 3:57 PM

## 2019-02-16 NOTE — Addendum Note (Signed)
Addended byCarrolyn Leigh on: 02/16/2019 04:41 PM   Modules accepted: Orders

## 2019-02-16 NOTE — Patient Instructions (Signed)

## 2019-02-16 NOTE — Telephone Encounter (Signed)
Patient is coming in

## 2019-02-19 ENCOUNTER — Telehealth: Payer: Self-pay | Admitting: Family Medicine

## 2019-02-19 ENCOUNTER — Other Ambulatory Visit: Payer: Self-pay | Admitting: Family Medicine

## 2019-02-19 DIAGNOSIS — K2100 Gastro-esophageal reflux disease with esophagitis, without bleeding: Secondary | ICD-10-CM

## 2019-02-19 DIAGNOSIS — K439 Ventral hernia without obstruction or gangrene: Secondary | ICD-10-CM

## 2019-02-19 DIAGNOSIS — K5900 Constipation, unspecified: Secondary | ICD-10-CM | POA: Insufficient documentation

## 2019-02-19 DIAGNOSIS — K5903 Drug induced constipation: Secondary | ICD-10-CM

## 2019-02-19 NOTE — Discharge Summary (Signed)
Physician Discharge Summary   Patient ID: Jon Reid MRN: [id number] DOB/AGE: 11/23/1928 85 y.o.  Admit date: 02/05/2019 Discharge date: 02/19/2019  Primary Diagnosis:  Lumbar spinal stenosis L3-L4 complete block  Admission Diagnoses:  Past Medical History:  Diagnosis Date  . Arthritis   . Carcinoma of prostate [facility])    prostate  . CKD (chronic kidney disease) stage 2, GFR 60-89 ml/min   . Coronary artery disease    a. LHC (8/14):  3v CAD => s/p CABG (LIMA-LAD, SVG-OM1, SVG-PDA)  . First degree AV block 01/28/2019   Noted on EKG  . GERD (gastroesophageal reflux disease)   . Gout   . HTN (hypertension)   . Hx of echocardiogram    Echocardiogram 11/13/12: EF 55-60%, normal wall motion, biatrial enlargement, mild AI, mild TR, no pericardial effusion  . Moderate aortic stenosis 12/06/2017   Noted on ECHO  . 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 Sleep apnea    Questionable  . Spinal stenosis    Discharge Diagnoses:   Principal Problem:   Spinal stenosis, lumbar region with neurogenic claudication Active Problems:   Benign essential HTN   Myelodysplastic syndrome   Hx of CABG   Coronary artery disease involving native coronary artery of native heart without angina pectoris   Gastroesophageal reflux disease with esophagitis   Elevated troponin   Fecal occult blood test positive  Procedure:  Procedure(s) (LRB): LUMBAR LAMINECTOMY/DECOMPRESSION L3-L4 (N/A)   Consults: None  HPI: The patient is an 85 year old male who presented to the office with the chief complaint of low back pain. He reported that he had his right knee gave out on him and he realized that he had some pain and weakness in the legs with walking. No injuries or falls. CT myelogram showed severe spinal stenosis L3-L4.    Laboratory Data: Hospital Outpatient Visit on 02/04/2019  Component Date Value Ref Range Status  . aPTT 02/04/2019 55* 24 - 36  seconds Final   Comment:        IF BASELINE aPTT IS ELEVATED, SUGGEST PATIENT RISK ASSESSMENT BE USED TO DETERMINE APPROPRIATE ANTICOAGULANT THERAPY. Performed at [facility], [place] [address], [place], [place] [address]   . WBC 02/04/2019 7.5  4.0 - 10.5 K/uL Final  . RBC 02/04/2019 3.61* 4.22 - 5.81 MIL/uL Final  . Hemoglobin 02/04/2019 11.5* 13.0 - 17.0 g/dL Final  . HCT 02/04/2019 37.1* 39.0 - 52.0 % Final  . MCV 02/04/2019 102.8* 80.0 - 100.0 fL Final  . MCH 02/04/2019 31.9  26.0 - 34.0 pg Final  . MCHC 02/04/2019 31.0  30.0 - 36.0 g/dL Final  . RDW 02/04/2019 13.6  11.5 - 15.5 % Final  . Platelets 02/04/2019 249  150 - 400 K/uL Final  . nRBC 02/04/2019 0.0  0.0 - 0.2 % Final  . Neutrophils Relative % 02/04/2019 64  % Final  . Neutro Abs 02/04/2019 4.8  1.7 - 7.7 K/uL Final  . Lymphocytes Relative 02/04/2019 26  % Final  . Lymphs Abs 02/04/2019 1.9  0.7 - 4.0 K/uL Final  . Monocytes Relative 02/04/2019 8  % Final  . Monocytes Absolute 02/04/2019 0.6  0.1 - 1.0 K/uL Final  . Eosinophils Relative 02/04/2019 1  % Final  . Eosinophils Absolute 02/04/2019 0.1  0.0 - 0.5 K/uL Final  . Basophils Relative 02/04/2019 1  % Final  . Basophils Absolute 02/04/2019 0.1  0.0 - 0.1 K/uL Final  . Immature Granulocytes 02/04/2019 0  % Final  . Abs Immature Granulocytes 02/04/2019 0.02  0.00 - 0.07 K/uL Final   Performed at [facility], [place] [address] [place], [place] [address]  . Sodium 02/04/2019 140  135 - 145 mmol/L Final  . Potassium 02/04/2019 4.1  3.5 - 5.1 mmol/L Final  . Chloride 02/04/2019 105  98 - 111 mmol/L Final  . CO2 02/04/2019 26  22 - 32 mmol/L Final  . Glucose, Bld 02/04/2019 107* 70 - 99 mg/dL Final  . BUN 02/04/2019 21  8 - 23 mg/dL Final  . Creatinine, Ser 02/04/2019 1.36* 0.61 - 1.24 mg/dL Final  . Calcium 02/04/2019 9.4  8.9 - 10.3 mg/dL Final  . Total Protein 02/04/2019 7.1  6.5 - 8.1 g/dL Final  . Albumin 02/04/2019 4.5  3.5 -  5.0 g/dL Final  . AST 02/04/2019 27  15 - 41 U/L Final  . ALT 02/04/2019 18  0 - 44 U/L Final  . Alkaline Phosphatase 02/04/2019 59  38 - 126 U/L Final  . Total Bilirubin 02/04/2019 1.3* 0.3 - 1.2 mg/dL Final  . GFR calc non Af Amer 02/04/2019 46* >60 mL/min Final  . GFR calc Af Amer 02/04/2019 53* >60 mL/min Final  . Anion gap 02/04/2019 9  5 - 15 Final   Performed at [facility], [place] [address] [place], [place] [address]  . Prothrombin Time 02/04/2019 13.4  11.4 - 15.2 seconds Final  . INR 02/04/2019 1.0  0.8 - 1.2 Final   Comment: (NOTE) INR goal varies based on device and disease states. Performed at [facility], [place] [address], [place], [place] [address]   . ABO/RH(D) 02/04/2019 O POS   Final  . Antibody Screen 02/04/2019 NEG   Final  . Sample Expiration 02/04/2019 02/08/2019,2359   Final  . Extend sample reason 02/04/2019 NO TRANSFUSIONS OR PREGNANCY IN THE PAST 3 MONTHS   Final  . Unit Number 02/04/2019 [id number]   Final  . Blood Component Type 02/04/2019 RBC LR PHER2   Final  . Unit division 02/04/2019 00   Final  . Status of Unit 02/04/2019 ISSUED,FINAL   Final  . Transfusion Status 02/04/2019 OK TO TRANSFUSE   Final  . Crossmatch Result 02/04/2019 Compatible   Final  . Unit Number 02/04/2019 [id number]   Final  . Blood Component Type 02/04/2019 RED CELLS,LR   Final  . Unit division 02/04/2019 00   Final  . Status of Unit 02/04/2019 ISSUED,FINAL   Final  . Transfusion Status 02/04/2019 OK TO TRANSFUSE   Final  . Crossmatch Result 02/04/2019    Final                   Value:Compatible Performed at [facility], [place] [address] [place], [place] [address]   . ABO/RH(D) 02/04/2019    Final                   Value:O POS Performed at [facility], [place] [address], [place], [place] [address]   . ISSUE DATE / TIME 02/04/2019 [id number]   Final  . Blood Product Unit Number 02/04/2019  [id number]   Final  . PRODUCT CODE 02/04/2019 [id number]   Final  . Unit Type and Rh 02/04/2019 5100   Final  . Blood Product Expiration Date 02/04/2019 [id number]   Final  . ISSUE DATE / TIME 02/04/2019 [id number]  Final  . Blood Product Unit Number 02/04/2019 [id number]   Final  . PRODUCT CODE 02/04/2019 [id number]   Final  . Unit Type and Rh 02/04/2019 5100   Final  . Blood Product Expiration Date 02/04/2019 [id number]   Final    X-Rays:Dg Lumbar Spine 2-3 Views  Result Date: 02/05/2019 CLINICAL DATA:  Preop. Please number vertebra. EXAM: LUMBAR SPINE - 2-3 VIEW COMPARISON:  Lumbar spine CT 01/16/2019 FINDINGS: Five non-rib-bearing lumbar vertebra, vertebra numbered. L3 inferior endplate fracture is unchanged from recent CT lumbar spine. The remaining vertebral body heights are preserved. Diffuse degenerative disc disease and facet arthropathy. Grade 1 anterolisthesis of L4 on L5 is unchanged. Grade 1 retrolisthesis of L5 on S1 is unchanged. IMPRESSION: 1. Unchanged appearance of L3 inferior endplate fracture. 2. Unchanged alignment with diffuse degenerative disc disease and facet arthropathy. 3. Five non-rib-bearing lumbar vertebra, vertebra were numbered on the images. Electronically Signed   By: Keith Rake M.D.   On: 02/05/2019 00:39   Dg Ankle 2 Views Right  Result Date: 02/10/2019 CLINICAL DATA:  Diffuse RIGHT ankle pain and swelling, mid RIGHT foot pain and swelling EXAM: RIGHT ANKLE - 2 VIEW COMPARISON:  None FINDINGS: Osseous demineralization. Ankle joint space preserved. Tiny plantar calcaneal spur. No acute fracture, dislocation, or bone destruction. Minimal vascular calcification and few small calcified phleboliths at distal lower leg. IMPRESSION: No acute osseous abnormalities. Electronically Signed   By: Lavonia Dana M.D.   On: 02/10/2019 08:43   Dg Spine Portable 1 View  Result Date: 02/05/2019 CLINICAL DATA:  Intraoperative image number 3 EXAM: PORTABLE SPINE -  1 VIEW COMPARISON:  Same day radiographs, lumbar CT 01/16/2019 FINDINGS: Intraoperative radiograph demonstrates posterior retractor placement and direction of a pointed surgical implement at the lamina of L4. Additional osseous findings are unchanged from comparisons. IMPRESSION: Pointed surgical implement at the lamina of L4.  Image labeled. Electronically Signed   By: Lovena Le M.D.   On: 02/05/2019 17:30   Dg Spine Portable 1 View  Result Date: 02/05/2019 CLINICAL DATA:  Intraoperative localization EXAM: PORTABLE SPINE - 1 VIEW COMPARISON:  Radiographs 02/05/2019, CT 01/16/2019 FINDINGS: Intraoperative radiograph the picks placement of 2 surgical clamps the first on the spinous process of L2 and the second on the spinous process of L3. Multilevel discogenic and facet degenerative changes are similar to comparison radiographs there is stable retrolisthesis L3 on L4 and anterolisthesis L4 on L5. Interspinous arthrosis is noted throughout the spine as well. IMPRESSION: Surgical clamps upon the spinous processes of L2 and L3. Electronically Signed   By: Lovena Le M.D.   On: 02/05/2019 15:39   Dg Spine Portable 1 View  Result Date: 02/05/2019 CLINICAL DATA:  Back surgery.  Number vertebra EXAM: PORTABLE SPINE - 1 VIEW COMPARISON:  02/04/2019 FINDINGS: Lowest disc space L5-S1 which is markedly narrowed. Anterolisthesis L4-5. Inferior endplate fracture of L3 is unchanged 2 needles are present in the soft tissues. One overlying the spinous process of L4 and the second needle overlying the spinous process of L5 IMPRESSION: Localization of L4 and L5 spinous processes. Electronically Signed   By: Franchot Gallo M.D.   On: 02/05/2019 15:24   Dg Foot 2 Views Right  Result Date: 02/10/2019 CLINICAL DATA:  Diffuse RIGHT ankle pain and swelling, mid RIGHT foot pain and swelling EXAM: RIGHT FOOT - 2 VIEW COMPARISON:  None FINDINGS: Osseous demineralization. Joint space narrowing at first MTP joint. Remaining  joint spaces preserved. Tiny plantar calcaneal spur. Mild soft tissue  swelling at dorsum of foot. No acute fracture, dislocation, or bone destruction. IMPRESSION: Degenerative changes first MTP joint. No acute osseous abnormalities. Electronically Signed   By: Lavonia Dana M.D.   On: 02/10/2019 08:44    Hospital Course: Patient was admitted to [facility] and taken to the OR and underwent the above state procedure without complications.  Patient tolerated the procedure well and was later transferred to the recovery room and then to the orthopaedic floor for postoperative care.  They were given PO and IV analgesics for pain control following their surgery.  They were given 24 hours of postoperative antibiotics.   PT was consulted postop to assist with mobility and transfers.  The patient was allowed to be WBAT with therapy and was taught back precautions. Discharge planning was consulted to help with postop disposition and equipment needs.  Patient had a fair night on the evening of surgery and started to get up OOB with therapy on day one. Patient progressed slowly due to ABLA. He had a positive stool guiac. He required two units of packed cells. He had chest pain due to the strain from anemia but was cleared by cardiology. Post op day 5 he was improved and ready for DC. They were given discharge instructions and dressing directions.  They were instructed on when to follow up in the office with Dr. Gladstone Lighter.   Diet: Cardiac diet Activity:WBAT Follow-up:in 1 week Disposition - Home Discharged Condition: stable   Discharge Instructions    Call MD / Call 911   Complete by: As directed    If you experience chest pain or shortness of breath, CALL 911 and be transported to the hospital emergency room.  If you develope a fever above 101 F, pus (white drainage) or increased drainage or redness at the wound, or calf pain, call your surgeon's office.   Call MD / Call 911   Complete by: As directed     If you experience chest pain or shortness of breath, CALL 911 and be transported to the hospital emergency room.  If you develope a fever above 101 F, pus (white drainage) or increased drainage or redness at the wound, or calf pain, call your surgeon's office.   Constipation Prevention   Complete by: As directed    Drink plenty of fluids.  Prune juice may be helpful.  You may use a stool softener, such as Colace (over the counter) 100 mg twice a day.  Use MiraLax (over the counter) for constipation as needed.   Constipation Prevention   Complete by: As directed    Drink plenty of fluids.  Prune juice may be helpful.  You may use a stool softener, such as Colace (over the counter) 100 mg twice a day.  Use MiraLax (over the counter) for constipation as needed.   Diet - low sodium heart healthy   Complete by: As directed    Discharge instructions   Complete by: As directed    For the first three days, remove your dressing, and tape a piece of saran wrap over your incision Take your shower, then remove the saran wrap and put a clean dressing on, then reapply your sling. After three days you can shower without the saran wrap.  No lifting or excessive bending No driving while taking pain medications.  Call Dr. Gladstone Lighter if any wound complications or temperature of 101 degrees F or over.  Call the office for an appointment to see Dr. Gladstone Lighter in two weeks: [phone]  and ask for Dr. Charlestine Night medical assistant, Brunilda Payor.   Face-to-face encounter (required for Medicare/Medicaid patients)   Complete by: As directed    I Nicholes Stairs certify that this patient is under my care and that I, or a nurse practitioner or physician's assistant working with me, had a face-to-face encounter that meets the physician face-to-face encounter requirements with this patient on 02/07/2019. The encounter with the patient was in whole, or in part for the following medical condition(s) which is the primary reason  for home health care (List medical condition): Lumbar spinal stenosis   The encounter with the patient was in whole, or in part, for the following medical condition, which is the primary reason for home health care: S/P lumbar laminectomy   I certify that, based on my findings, the following services are medically necessary home health services: Physical therapy   Reason for Medically Necessary Home Health Services: Therapy- Therapeutic Exercises to Increase Strength and Endurance   My clinical findings support the need for the above services: Pain interferes with ambulation/mobility   Further, I certify that my clinical findings support that this patient is homebound due to: Unable to leave home safely without assistance   Home Health   Complete by: As directed    To provide the following care/treatments:  PT OT     Incentive spirometry RT   Complete by: As directed    Increase activity slowly as tolerated   Complete by: As directed    Increase activity slowly as tolerated   Complete by: As directed      Allergies as of 02/10/2019      Reactions   Crestor [rosuvastatin]    GYNECOMASTIA AND SWELLING      Medication List    TAKE these medications   aspirin EC 81 MG tablet Take 81 mg by mouth daily.   diclofenac sodium 1 % Gel Commonly known as: VOLTAREN Apply 2 g topically 4 (four) times daily.   ferrous sulfate 325 (65 FE) MG EC tablet Take 1 tablet (325 mg total) by mouth 2 (two) times daily for 30 doses.   fluticasone 50 MCG/ACT nasal spray Commonly known as: FLONASE One to 2 sprays each nostril at bedtime What changed:   how much to take  how to take this  when to take this  reasons to take this  additional instructions   furosemide 40 MG tablet Commonly known as: LASIX Take 1 tablet (40 mg total) by mouth daily.   HYDROcodone-acetaminophen 5-325 MG tablet Commonly known as: NORCO/VICODIN Take 1 tablet by mouth every 4 (four) hours as needed for moderate  pain ((score 4 to 6)).   methocarbamol 500 MG tablet Commonly known as: ROBAXIN Take 1 tablet (500 mg total) by mouth every 6 (six) hours as needed for muscle spasms.   nitroGLYCERIN 0.4 MG SL tablet Commonly known as: NITROSTAT Place 1 tablet (0.4 mg total) under the tongue every 5 (five) minutes as needed for chest pain.   pantoprazole 40 MG tablet Commonly known as: PROTONIX Take 1 tablet (40 mg total) by mouth daily. For stomach   triamcinolone cream 0.1 % Commonly known as: KENALOG Apply 1 application topically 2 (two) times daily. What changed:   when to take this  reasons to take this   Vitamin D 50 MCG (2000 UT) Caps Take 2,000 Units by mouth daily.      Follow-up Information    Latanya Maudlin, MD. Schedule an appointment as soon as possible for  a visit in 1 week(s).   Specialty: Orthopedic Surgery Contact information: [address] [place] [address] [place] [address] [phone]        [facility] Follow up.   Why: agency will provide home health physical therapy and occupational therapy. agency will call you to schedule visit. Contact information: [place] [address] [phone]        Wilford Corner, MD. Schedule an appointment as soon as possible for a visit.   Specialty: Gastroenterology Contact information: [id number] [place] [address] [phone]           Signed: Ardeen Jourdain, PA-C Orthopaedic Surgery 02/19/2019, 9:25 AM

## 2019-02-19 NOTE — Telephone Encounter (Signed)
Referral placed.

## 2019-02-19 NOTE — Telephone Encounter (Signed)
Pt's son aware.

## 2019-02-20 ENCOUNTER — Telehealth: Payer: Self-pay | Admitting: Family Medicine

## 2019-02-20 NOTE — Telephone Encounter (Signed)
Spoke with pt and he says he is anxious and would like something called in for his nerves. Advised pt he would ntbs for this and offered appt with Sharyn Lull 12/9 but pt declined.

## 2019-02-23 ENCOUNTER — Telehealth: Payer: Self-pay | Admitting: Family Medicine

## 2019-02-23 DIAGNOSIS — M79671 Pain in right foot: Secondary | ICD-10-CM | POA: Diagnosis not present

## 2019-02-23 DIAGNOSIS — Z4789 Encounter for other orthopedic aftercare: Secondary | ICD-10-CM | POA: Diagnosis not present

## 2019-02-23 NOTE — Telephone Encounter (Signed)
Wife wanted to know if we could draw blood for his cancer doctor. Told her yes, with an order only

## 2019-02-25 ENCOUNTER — Telehealth: Payer: Self-pay | Admitting: Family Medicine

## 2019-02-25 NOTE — Telephone Encounter (Signed)
He needs to see GI as his stool was positive for blood. Not sure why he would need to see hematology unless they could not find the cause of the anemia.

## 2019-02-25 NOTE — Telephone Encounter (Signed)
Pt states the surgeon that did his spinal surgery said he needs a referral to hematology for his blood work and since he had a transfusion but then pt says that is why he is going to the GI doctor. Pt seems a little confused. Do you know anything about this? Does pt ntbs?

## 2019-03-04 ENCOUNTER — Ambulatory Visit (INDEPENDENT_AMBULATORY_CARE_PROVIDER_SITE_OTHER): Payer: MEDICARE

## 2019-03-04 ENCOUNTER — Other Ambulatory Visit: Payer: Self-pay

## 2019-03-04 DIAGNOSIS — R631 Polydipsia: Secondary | ICD-10-CM | POA: Diagnosis not present

## 2019-03-04 DIAGNOSIS — I350 Nonrheumatic aortic (valve) stenosis: Secondary | ICD-10-CM

## 2019-03-04 DIAGNOSIS — M1990 Unspecified osteoarthritis, unspecified site: Secondary | ICD-10-CM

## 2019-03-04 DIAGNOSIS — I129 Hypertensive chronic kidney disease with stage 1 through stage 4 chronic kidney disease, or unspecified chronic kidney disease: Secondary | ICD-10-CM | POA: Diagnosis not present

## 2019-03-04 DIAGNOSIS — R7989 Other specified abnormal findings of blood chemistry: Secondary | ICD-10-CM

## 2019-03-04 DIAGNOSIS — Z951 Presence of aortocoronary bypass graft: Secondary | ICD-10-CM

## 2019-03-04 DIAGNOSIS — M1030 Gout due to renal impairment, unspecified site: Secondary | ICD-10-CM | POA: Diagnosis not present

## 2019-03-04 DIAGNOSIS — R262 Difficulty in walking, not elsewhere classified: Secondary | ICD-10-CM

## 2019-03-04 DIAGNOSIS — N183 Chronic kidney disease, stage 3 unspecified: Secondary | ICD-10-CM

## 2019-03-04 DIAGNOSIS — D62 Acute posthemorrhagic anemia: Secondary | ICD-10-CM

## 2019-03-04 DIAGNOSIS — R195 Other fecal abnormalities: Secondary | ICD-10-CM

## 2019-03-04 DIAGNOSIS — I2510 Atherosclerotic heart disease of native coronary artery without angina pectoris: Secondary | ICD-10-CM

## 2019-03-04 DIAGNOSIS — Z79899 Other long term (current) drug therapy: Secondary | ICD-10-CM

## 2019-03-04 DIAGNOSIS — Z4789 Encounter for other orthopedic aftercare: Secondary | ICD-10-CM | POA: Diagnosis not present

## 2019-03-04 DIAGNOSIS — I4510 Unspecified right bundle-branch block: Secondary | ICD-10-CM

## 2019-03-04 DIAGNOSIS — Z7982 Long term (current) use of aspirin: Secondary | ICD-10-CM

## 2019-03-04 DIAGNOSIS — M48062 Spinal stenosis, lumbar region with neurogenic claudication: Secondary | ICD-10-CM | POA: Diagnosis not present

## 2019-03-04 DIAGNOSIS — K2100 Gastro-esophageal reflux disease with esophagitis, without bleeding: Secondary | ICD-10-CM

## 2019-03-04 DIAGNOSIS — D469 Myelodysplastic syndrome, unspecified: Secondary | ICD-10-CM | POA: Diagnosis not present

## 2019-03-06 DIAGNOSIS — K219 Gastro-esophageal reflux disease without esophagitis: Secondary | ICD-10-CM | POA: Diagnosis not present

## 2019-03-06 DIAGNOSIS — K5900 Constipation, unspecified: Secondary | ICD-10-CM | POA: Diagnosis not present

## 2019-03-06 DIAGNOSIS — D649 Anemia, unspecified: Secondary | ICD-10-CM | POA: Diagnosis not present

## 2019-03-18 ENCOUNTER — Ambulatory Visit (INDEPENDENT_AMBULATORY_CARE_PROVIDER_SITE_OTHER): Payer: MEDICARE | Admitting: Family Medicine

## 2019-03-18 ENCOUNTER — Other Ambulatory Visit: Payer: Self-pay

## 2019-03-18 ENCOUNTER — Encounter: Payer: Self-pay | Admitting: Family Medicine

## 2019-03-18 VITALS — BP 126/68 | HR 74 | Temp 96.8°F | Resp 20 | Ht 71.0 in | Wt 157.0 lb

## 2019-03-18 DIAGNOSIS — D500 Iron deficiency anemia secondary to blood loss (chronic): Secondary | ICD-10-CM | POA: Diagnosis not present

## 2019-03-18 DIAGNOSIS — M25561 Pain in right knee: Secondary | ICD-10-CM

## 2019-03-18 DIAGNOSIS — R6889 Other general symptoms and signs: Secondary | ICD-10-CM | POA: Diagnosis not present

## 2019-03-18 DIAGNOSIS — D649 Anemia, unspecified: Secondary | ICD-10-CM | POA: Diagnosis not present

## 2019-03-18 DIAGNOSIS — G8929 Other chronic pain: Secondary | ICD-10-CM

## 2019-03-18 MED ORDER — METHYLPREDNISOLONE ACETATE 80 MG/ML IJ SUSP
60.0000 mg | Freq: Once | INTRAMUSCULAR | Status: AC
  Administered 2019-03-18: 09:00:00 60 mg via INTRA_ARTICULAR

## 2019-03-18 NOTE — Progress Notes (Signed)
Subjective:  Patient ID: Jon Reid, male    DOB: 10-Mar-1929, 85 y.o.   MRN: [id number]  Patient Care Team: Baruch Gouty, FNP as PCP - General (Family Medicine) Minus Breeding, MD as PCP - Cardiology (Cardiology) Festus Aloe, MD (Urology) Curt Bears, MD (Hematology and Oncology) Warden Fillers, MD as Consulting Physician (Ophthalmology) Juanita Craver, MD as Consulting Physician (Gastroenterology) Latanya Maudlin, MD as Consulting Physician (Orthopedic Surgery) Kathrynn Ducking, MD as Consulting Physician (Neurology)   Chief Complaint:  right knee pain (Right >left )   HPI: Jon Reid is a 85 y.o. male presenting on 03/18/2019 for right knee pain (Right >left )   Pt recently had back surgery and Hgb and Hct dropped while in hospital/. Pt did received blood during stay. Has not had repeat labs to see if corrected. No dizziness, chest pain, shortness of breath, palpitations, or syncope. No pallor.   Knee Pain  The incident occurred more than 1 week ago. There was no injury mechanism. The pain is present in the left knee and right knee (right worse than left). The pain is at a severity of 6/10. The pain is moderate. The pain has been constant since onset. Pertinent negatives include no inability to bear weight, loss of motion, loss of sensation, muscle weakness, numbness or tingling. He reports no foreign bodies present. The symptoms are aggravated by movement, weight bearing and palpation. He has tried NSAIDs for the symptoms. The treatment provided no relief.     Relevant past medical, surgical, family, and social history reviewed and updated as indicated.  Allergies and medications reviewed and updated. Date reviewed: Chart in Epic.   Past Medical History:  Diagnosis Date  . Arthritis   . Carcinoma of prostate [facility])    prostate  . CKD (chronic kidney disease) stage 2, GFR 60-89 ml/min   . Coronary artery disease    a. LHC (8/14):  3v CAD => s/p  CABG (LIMA-LAD, SVG-OM1, SVG-PDA)  . First degree AV block 01/28/2019   Noted on EKG  . GERD (gastroesophageal reflux disease)   . Gout   . HTN (hypertension)   . Hx of echocardiogram    Echocardiogram 11/13/12: EF 55-60%, normal wall motion, biatrial enlargement, mild AI, mild TR, no pericardial effusion  . Moderate aortic stenosis 12/06/2017   Noted on ECHO  . 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 Sleep apnea    Questionable  . Spinal stenosis     Past Surgical History:  Procedure Laterality Date  . CARDIAC CATHETERIZATION  11/07/2012   Dr Acie Fredrickson  . CATARACT EXTRACTION W/ INTRAOCULAR LENS IMPLANT Bilateral   . CORONARY ARTERY BYPASS GRAFT N/A 11/11/2012   Procedure: CORONARY ARTERY BYPASS GRAFTING times four using Right Greater Saphenous Vein Graft harvested endoscopically and Left Internal Mammary Artery.;  Surgeon: Ivin Poot, MD;  Location: [facility];  Service: Open Heart Surgery;  Laterality: N/A;  . INGUINAL HERNIA REPAIR Right 01/10/2017   Procedure: OPEN RIGHT HERNIA REPAIR INGUINAL;  Surgeon: Ileana Roup, MD;  Location: [facility];  Service: General;  Laterality: Right;  . INSERTION OF MESH Right 01/10/2017   Procedure: INSERTION OF MESH;  Surgeon: Ileana Roup, MD;  Location: [facility];  Service: General;  Laterality: Right;  . INTRAOPERATIVE TRANSESOPHAGEAL ECHOCARDIOGRAM N/A 11/11/2012   Procedure: INTRAOPERATIVE TRANSESOPHAGEAL ECHOCARDIOGRAM;  Surgeon: Ivin Poot, MD;  Location: [facility];  Service:  Open Heart Surgery;  Laterality: N/A;  . LEFT HEART CATHETERIZATION WITH CORONARY ANGIOGRAM N/A 11/07/2012   Procedure: LEFT HEART CATHETERIZATION WITH CORONARY ANGIOGRAM;  Surgeon: Thayer Headings, MD;  Location: [facility];  Service: Cardiovascular;  Laterality: N/A;  . LUMBAR LAMINECTOMY/DECOMPRESSION MICRODISCECTOMY N/A 02/05/2019   Procedure: LUMBAR LAMINECTOMY/DECOMPRESSION L3-L4;  Surgeon: Latanya Maudlin, MD;   Location: [facility];  Service: Orthopedics;  Laterality: N/A;  4mn  . LYMPH NODE DISSECTION     Bilateral pelvic  . RETROPUBIC PROSTATECTOMY     Radical    Social History   Socioeconomic History  . Marital status: Married    Spouse name: [name] . Number of children: 3  . Years of education: 169 . Highest education level: High school graduate  Occupational History  . Occupation: Retired  Tobacco Use  . Smoking status: Never Smoker  . Smokeless tobacco: Never Used  Substance and Sexual Activity  . Alcohol use: Yes    Comment: Rare  . Drug use: No  . Sexual activity: Not Currently  Other Topics Concern  . Not on file  Social History Narrative   Married with 3 children   Denies caffeine use    Social Determinants of Health   Financial Resource Strain: Low Risk   . Difficulty of Paying Living Expenses: Not hard at all  Food Insecurity: No Food Insecurity  . Worried About RCharity fundraiserin the Last Year: Never true  . Ran Out of Food in the Last Year: Never true  Transportation Needs: No Transportation Needs  . Lack of Transportation (Medical): No  . Lack of Transportation (Non-Medical): No  Physical Activity: Sufficiently Active  . Days of Exercise per Week: 7 days  . Minutes of Exercise per Session: 30 min  Stress: No Stress Concern Present  . Feeling of Stress : Not at all  Social Connections: Somewhat Isolated  . Frequency of Communication with Friends and Family: More than three times a week  . Frequency of Social Gatherings with Friends and Family: More than three times a week  . Attends Religious Services: Never  . Active Member of Clubs or Organizations: No  . Attends CArchivistMeetings: Never  . Marital Status: Married  IHuman resources officerViolence: Not At Risk  . Fear of Current or Ex-Partner: No  . Emotionally Abused: No  . Physically Abused: No  . Sexually Abused: No    Outpatient Encounter Medications as of 03/18/2019  Medication Sig    . aspirin EC 81 MG tablet Take 81 mg by mouth daily.  . Cholecalciferol (VITAMIN D) 2000 units CAPS Take 2,000 Units by mouth daily.  . diclofenac sodium (VOLTAREN) 1 % GEL Apply 2 g topically 4 (four) times daily.  . furosemide (LASIX) 40 MG tablet Take 1 tablet (40 mg total) by mouth daily.  . pantoprazole (PROTONIX) 40 MG tablet Take 1 tablet (40 mg total) by mouth daily. For stomach  . triamcinolone cream (KENALOG) 0.1 % Apply 1 application topically 2 (two) times daily. (Patient taking differently: Apply 1 application topically daily as needed (itching). )  . fluticasone (FLONASE) 50 MCG/ACT nasal spray One to 2 sprays each nostril at bedtime (Patient not taking: Reported on 03/18/2019)  . methocarbamol (ROBAXIN) 500 MG tablet Take 1 tablet (500 mg total) by mouth every 6 (six) hours as needed for muscle spasms. (Patient not taking: Reported on 03/18/2019)  . nitroGLYCERIN (NITROSTAT) 0.4 MG SL tablet Place 1 tablet (0.4 mg total) under  the tongue every 5 (five) minutes as needed for chest pain. (Patient not taking: Reported on 03/18/2019)  . [DISCONTINUED] cephALEXin (KEFLEX) 250 MG capsule Take 2 capsules (500 mg total) by mouth 3 (three) times daily.  . [DISCONTINUED] ferrous sulfate 325 (65 FE) MG EC tablet Take 1 tablet (325 mg total) by mouth 2 (two) times daily for 30 doses.  . [DISCONTINUED] HYDROcodone-acetaminophen (NORCO/VICODIN) 5-325 MG tablet Take 1 tablet by mouth every 4 (four) hours as needed for moderate pain ((score 4 to 6)).   Facility-Administered Encounter Medications as of 03/18/2019  Medication  . cyanocobalamin ((VITAMIN B-12)) injection 1,000 mcg  . [COMPLETED] methylPREDNISolone acetate (DEPO-MEDROL) injection 60 mg    Allergies  Allergen Reactions  . Crestor [Rosuvastatin]     GYNECOMASTIA AND SWELLING    Review of Systems  Constitutional: Negative for activity change, appetite change, chills, diaphoresis, fatigue, fever and unexpected weight change.   HENT: Negative.   Eyes: Negative.  Negative for photophobia and visual disturbance.  Respiratory: Negative for cough, chest tightness and shortness of breath.   Cardiovascular: Negative for chest pain, palpitations and leg swelling.  Gastrointestinal: Negative for abdominal pain, anal bleeding, blood in stool, constipation, diarrhea, nausea and vomiting.  Endocrine: Negative.   Genitourinary: Negative for decreased urine volume, difficulty urinating, dysuria, frequency, hematuria and urgency.  Musculoskeletal: Positive for arthralgias, back pain, gait problem and myalgias.  Skin: Negative.  Negative for color change and pallor.  Allergic/Immunologic: Negative.   Neurological: Negative for dizziness, tingling, tremors, seizures, syncope, facial asymmetry, speech difficulty, weakness, light-headedness, numbness and headaches.  Hematological: Negative.   Psychiatric/Behavioral: Negative for confusion, hallucinations, sleep disturbance and suicidal ideas.  All other systems reviewed and are negative.       Objective:  BP 126/68   Pulse 74   Temp (!) 96.8 F (36 C)   Resp 20   Ht _0  (1.803 m)   Wt 157 lb (71.2 kg)   SpO2 97%   BMI 21.90 kg/m    Wt Readings from Last 3 Encounters:  03/18/19 157 lb (71.2 kg)  02/05/19 159 lb 11.2 oz (72.4 kg)  02/04/19 159 lb 11.2 oz (72.4 kg)    Physical Exam Vitals and nursing note reviewed.  Constitutional:      General: He is not in acute distress.    Appearance: Normal appearance. He is well-developed, well-groomed and normal weight. He is not ill-appearing, toxic-appearing or diaphoretic.  HENT:     Head: Normocephalic and atraumatic.     Jaw: There is normal jaw occlusion.     Right Ear: Hearing normal.     Left Ear: Hearing normal.     Nose: Nose normal.     Mouth/Throat:     Lips: Pink.     Mouth: Mucous membranes are moist.     Pharynx: Oropharynx is clear. Uvula midline.  Eyes:     General: Lids are normal.      Extraocular Movements: Extraocular movements intact.     Conjunctiva/sclera: Conjunctivae normal.     Pupils: Pupils are equal, round, and reactive to light.  Neck:     Thyroid: No thyroid mass, thyromegaly or thyroid tenderness.     Vascular: No carotid bruit or JVD.     Trachea: Trachea and phonation normal.  Cardiovascular:     Rate and Rhythm: Normal rate and regular rhythm.     Chest Wall: PMI is not displaced.     Pulses: Normal pulses.     Heart sounds:  Murmur present. Systolic murmur present with a grade of 2/6. No friction rub. No gallop.   Pulmonary:     Effort: Pulmonary effort is normal. No respiratory distress.     Breath sounds: Normal breath sounds. No wheezing.  Abdominal:     General: Bowel sounds are normal. There is no distension or abdominal bruit.     Palpations: Abdomen is soft. There is no hepatomegaly or splenomegaly.     Tenderness: There is no abdominal tenderness. There is no right CVA tenderness or left CVA tenderness.     Hernia: No hernia is present.  Musculoskeletal:     Cervical back: Normal range of motion and neck supple.     Right upper leg: Normal.     Left upper leg: Normal.     Right knee: Swelling present. No deformity, effusion, erythema, ecchymosis, lacerations, bony tenderness or crepitus. Decreased range of motion. Tenderness present over the medial joint line and lateral joint line. No MCL, LCL, ACL, PCL or patellar tendon tenderness. No LCL laxity, MCL laxity, ACL laxity or PCL laxity. Normal alignment, normal meniscus and normal patellar mobility. Normal pulse.     Instability Tests: Negative medial McMurray test and negative lateral McMurray test.     Left knee: No swelling, deformity, effusion, erythema, ecchymosis, lacerations, bony tenderness or crepitus. Decreased range of motion. Tenderness present. No medial joint line, lateral joint line, MCL, LCL, ACL, PCL or patellar tendon tenderness. No LCL laxity, MCL laxity, ACL laxity or PCL  laxity.Normal alignment, normal meniscus and normal patellar mobility. Normal pulse.     Instability Tests: Negative medial McMurray test and negative lateral McMurray test.     Right lower leg: Normal. No edema.     Left lower leg: Normal. No edema.  Lymphadenopathy:     Cervical: No cervical adenopathy.  Skin:    General: Skin is warm and dry.     Capillary Refill: Capillary refill takes less than 2 seconds.     Coloration: Skin is not cyanotic, jaundiced or pale.     Findings: No rash.  Neurological:     General: No focal deficit present.     Mental Status: He is alert and oriented to person, place, and time.     Cranial Nerves: Cranial nerves are intact.     Sensory: Sensation is intact.     Motor: Motor function is intact.     Coordination: Coordination is intact.     Gait: Gait is intact.     Deep Tendon Reflexes: Reflexes are normal and symmetric.  Psychiatric:        Attention and Perception: Attention and perception normal.        Mood and Affect: Mood and affect normal.        Speech: Speech normal.        Behavior: Behavior normal. Behavior is cooperative.        Thought Content: Thought content normal.        Cognition and Memory: Cognition and memory normal.        Judgment: Judgment normal.      Results for orders placed or performed during the hospital encounter of 02/05/19  Surgical pcr screen   Specimen: Nasal Mucosa; Nasal Swab  Result Value Ref Range   MRSA, PCR NEGATIVE NEGATIVE   Staphylococcus aureus NEGATIVE NEGATIVE  CBC  Result Value Ref Range   WBC 9.4 4.0 - 10.5 K/uL   RBC 2.05 (L) 4.22 - 5.81 MIL/uL   Hemoglobin 6.8 (  LL) 13.0 - 17.0 g/dL   HCT 21.3 (L) 39.0 - 52.0 %   MCV 103.9 (H) 80.0 - 100.0 fL   MCH 33.2 26.0 - 34.0 pg   MCHC 31.9 30.0 - 36.0 g/dL   RDW 13.9 11.5 - 15.5 %   Platelets 167 150 - 400 K/uL   nRBC 0.0 0.0 - 0.2 %  Hemoglobin and hematocrit, blood  Result Value Ref Range   Hemoglobin 9.0 (L) 13.0 - 17.0 g/dL   HCT 28.0  (L) 39.0 - 52.0 %  Occult blood card to lab, stool RN will collect  Result Value Ref Range   Fecal Occult Bld POSITIVE (A) NEGATIVE  CBC  Result Value Ref Range   WBC 9.5 4.0 - 10.5 K/uL   RBC 3.12 (L) 4.22 - 5.81 MIL/uL   Hemoglobin 9.6 (L) 13.0 - 17.0 g/dL   HCT 30.3 (L) 39.0 - 52.0 %   MCV 97.1 80.0 - 100.0 fL   MCH 30.8 26.0 - 34.0 pg   MCHC 31.7 30.0 - 36.0 g/dL   RDW 18.5 (H) 11.5 - 15.5 %   Platelets 181 150 - 400 K/uL   nRBC 0.0 0.0 - 0.2 %  Basic metabolic panel  Result Value Ref Range   Sodium 139 135 - 145 mmol/L   Potassium 3.6 3.5 - 5.1 mmol/L   Chloride 104 98 - 111 mmol/L   CO2 25 22 - 32 mmol/L   Glucose, Bld 123 (H) 70 - 99 mg/dL   BUN 27 (H) 8 - 23 mg/dL   Creatinine, Ser 1.31 (H) 0.61 - 1.24 mg/dL   Calcium 8.8 (L) 8.9 - 10.3 mg/dL   GFR calc non Af Amer 48 (L) >60 mL/min   GFR calc Af Amer 56 (L) >60 mL/min   Anion gap 10 5 - 15  Magnesium  Result Value Ref Range   Magnesium 1.9 1.7 - 2.4 mg/dL  Vitamin B12  Result Value Ref Range   Vitamin B-12 552 180 - 914 pg/mL  Folate  Result Value Ref Range   Folate 9.2 >5.9 ng/mL  Iron and TIBC  Result Value Ref Range   Iron 22 (L) 45 - 182 ug/dL   TIBC 249 (L) 250 - 450 ug/dL   Saturation Ratios 9 (L) 17.9 - 39.5 %   UIBC 227 ug/dL  Ferritin  Result Value Ref Range   Ferritin 37 24 - 336 ng/mL  Reticulocytes  Result Value Ref Range   Retic Ct Pct 1.8 0.4 - 3.1 %   RBC. 3.10 (L) 4.22 - 5.81 MIL/uL   Retic Count, Absolute 55.5 19.0 - 186.0 K/uL   Immature Retic Fract 27.9 (H) 2.3 - 58.5 %  Basic metabolic panel  Result Value Ref Range   Sodium 138 135 - 145 mmol/L   Potassium 3.4 (L) 3.5 - 5.1 mmol/L   Chloride 103 98 - 111 mmol/L   CO2 26 22 - 32 mmol/L   Glucose, Bld 120 (H) 70 - 99 mg/dL   BUN 26 (H) 8 - 23 mg/dL   Creatinine, Ser 1.28 (H) 0.61 - 1.24 mg/dL   Calcium 8.7 (L) 8.9 - 10.3 mg/dL   GFR calc non Af Amer 50 (L) >60 mL/min   GFR calc Af Amer 58 (L) >60 mL/min   Anion gap 9 5 - 15    CBC  Result Value Ref Range   WBC 8.9 4.0 - 10.5 K/uL   RBC 3.16 (L) 4.22 - 5.81 MIL/uL  Hemoglobin 9.6 (L) 13.0 - 17.0 g/dL   HCT 30.1 (L) 39.0 - 52.0 %   MCV 95.3 80.0 - 100.0 fL   MCH 30.4 26.0 - 34.0 pg   MCHC 31.9 30.0 - 36.0 g/dL   RDW 17.9 (H) 11.5 - 15.5 %   Platelets 209 150 - 400 K/uL   nRBC 0.0 0.0 - 0.2 %  Magnesium  Result Value Ref Range   Magnesium 2.0 1.7 - 2.4 mg/dL  ECHOCARDIOGRAM COMPLETE  Result Value Ref Range   Weight 2,555.18 oz   Height 71 in   BP 144/62 mmHg  Prepare RBC  Result Value Ref Range   Order Confirmation      ORDER PROCESSED BY BLOOD BANK Performed at [facility] [address] [place], [place] [address]   Troponin I (High Sensitivity)  Result Value Ref Range   Troponin I (High Sensitivity) 24 (H) <18 ng/L  Troponin I (High Sensitivity)  Result Value Ref Range   Troponin I (High Sensitivity) 94 (H) <18 ng/L  Troponin I (High Sensitivity)  Result Value Ref Range   Troponin I (High Sensitivity) 57 (H) <18 ng/L     Joint Injection/Arthrocentesis  Date/Time: 03/18/2019 8:44 AM Performed by: Baruch Gouty, FNP Authorized by: Baruch Gouty, FNP  Indications: joint swelling and pain  Body area: knee Joint: right knee Local anesthesia used: yes  Anesthesia: Local anesthesia used: yes Local Anesthetic: lidocaine spray  Sedation: Patient sedated: no  Preparation: Patient was prepped and draped in the usual sterile fashion. Needle size: 18 G Ultrasound guidance: no Approach: anterior Aspirate amount: 0 mL Methylprednisolone amount: 60 mg Lidocaine 2% amount: 3 mL Patient tolerance: patient tolerated the procedure well with no immediate complications Comments: Pts name, DOB, allergies, and procedure verified prior to starting. Pt gave verbal consent for procedure.      Pertinent labs & imaging results that were available during my care of the patient were reviewed by me and considered in my medical  decision making.  Assessment & Plan:  Gaege was seen today for right knee pain.  Diagnoses and all orders for this visit:  Chronic pain of right knee Ongoing pain in right knee, worse with weight bearing and going up and down stairs. Had relief for several months after joint injection. Pt would like relief via joint injection. Joint injection today with 60 mg DepoMedrol and 2% lidocaine. Pt tolerated well. Follow up in 4 weeks for reevaluation.   Anemia, unspecified type Hgb 9.6 and Hct 30.1 on 02/10/2019 after surgery.  Pt has not been taking repletion iron therapy. Will check labs today.  -     Anemia Profile with CBC  Hypocalcemia Will recheck today. Will add repletion therapy if warranted.  -     CMP14+EGFR     Continue all other maintenance medications.  Follow up plan: Return in about 4 weeks (around 04/15/2019), or if symptoms worsen or fail to improve.  Continue healthy lifestyle choices, including diet (rich in fruits, vegetables, and lean proteins, and low in salt and simple carbohydrates) and exercise (at least 30 minutes of moderate physical activity daily).  Educational handout given for knee pain  The above assessment and management plan was discussed with the patient. The patient verbalized understanding of and has agreed to the management plan. Patient is aware to call the clinic if they develop any new symptoms or if symptoms persist or worsen. Patient is aware when to return to the clinic for a  follow-up visit. Patient educated on when it is appropriate to go to the emergency department.   Monia Pouch, FNP-C [facility] [phone]

## 2019-03-18 NOTE — Telephone Encounter (Signed)
Several attempts have been made to patient with no call back- this encounter will be closed.

## 2019-03-18 NOTE — Patient Instructions (Signed)
Acute Knee Pain, Adult Many things can cause knee pain. Sometimes, knee pain is sudden (acute) and may be caused by damage, swelling, or irritation of the muscles and tissues that support your knee. The pain often goes away on its own with time and rest. If the pain does not go away, tests may be done to find out what is causing the pain. Follow these instructions at home: Pay attention to any changes in your symptoms. Take these actions to relieve your pain. If you have a knee sleeve or brace:   Wear the sleeve or brace as told by your doctor. Remove it only as told by your doctor.  Loosen the sleeve or brace if your toes: ? Tingle. ? Become numb. ? Turn cold and blue.  Keep the sleeve or brace clean.  If the sleeve or brace is not waterproof: ? Do not let it get wet. ? Cover it with a watertight covering when you take a bath or shower. Activity  Rest your knee.  Do not do things that cause pain.  Avoid activities where both feet leave the ground at the same time (high-impact activities). Examples are running, jumping rope, and doing jumping jacks.  Work with a physical therapist to make a safe exercise program, as told by your doctor. Managing pain, stiffness, and swelling   If told, put ice on the knee: ? Put ice in a plastic bag. ? Place a towel between your skin and the bag. ? Leave the ice on for 20 minutes, 2-3 times a day.  If told, put pressure (compression) on your injured knee to control swelling, give support, and help with discomfort. Compression may be done with an elastic bandage. General instructions  Take all medicines only as told by your doctor.  Raise (elevate) your knee while you are sitting or lying down. Make sure your knee is higher than your heart.  Sleep with a pillow under your knee.  Do not use any products that contain nicotine or tobacco. These include cigarettes, e-cigarettes, and chewing tobacco. These products may slow down healing. If  you need help quitting, ask your doctor.  If you are overweight, work with your doctor and a food expert (dietitian) to set goals to lose weight. Being overweight can make your knee hurt more.  Keep all follow-up visits as told by your doctor. This is important. Contact a doctor if:  The knee pain does not stop.  The knee pain changes or gets worse.  You have a fever along with knee pain.  Your knee feels warm when you touch it.  Your knee gives out or locks up. Get help right away if:  Your knee swells, and the swelling gets worse.  You cannot move your knee.  You have very bad knee pain. Summary  Many things can cause knee pain. The pain often goes away on its own with time and rest.  Your doctor may do tests to find out the cause of the pain.  Pay attention to any changes in your symptoms. Relieve your pain with rest, medicines, light activity, and use of ice.  Get help right away if you cannot move your knee or your knee pain is very bad. This information is not intended to replace advice given to you by your health care provider. Make sure you discuss any questions you have with your health care provider. Document Released: 06/01/2008 Document Revised: 08/15/2017 Document Reviewed: 08/15/2017 Elsevier Patient Education  2020 [employer].

## 2019-03-19 LAB — ANEMIA PROFILE B
Basophils Absolute: 0 10*3/uL (ref 0.0–0.2)
Basos: 1 %
EOS (ABSOLUTE): 0.1 10*3/uL (ref 0.0–0.4)
Eos: 1 %
Ferritin: 164 ng/mL (ref 30–400)
Folate: 9.7 ng/mL (ref >3.0)
Hematocrit: 32.1 % — ABNORMAL LOW (ref 37.5–51.0)
Hemoglobin: 10.8 g/dL — ABNORMAL LOW (ref 13.0–17.7)
Immature Grans (Abs): 0 10*3/uL (ref 0.0–0.1)
Immature Granulocytes: 0 %
Iron Saturation: 13 % — ABNORMAL LOW (ref 15–55)
Iron: 33 ug/dL — ABNORMAL LOW (ref 38–169)
Lymphocytes Absolute: 1.8 10*3/uL (ref 0.7–3.1)
Lymphs: 23 %
MCH: 32 pg (ref 26.6–33.0)
MCHC: 33.6 g/dL (ref 31.5–35.7)
MCV: 95 fL (ref 79–97)
Monocytes Absolute: 0.9 10*3/uL (ref 0.1–0.9)
Monocytes: 11 %
Neutrophils Absolute: 5.1 10*3/uL (ref 1.4–7.0)
Neutrophils: 64 %
Platelets: 290 10*3/uL (ref 150–450)
RBC: 3.38 x10E6/uL — ABNORMAL LOW (ref 4.14–5.80)
RDW: 15.4 % (ref 11.6–15.4)
Retic Ct Pct: 1.3 % (ref 0.6–2.6)
Total Iron Binding Capacity: 248 ug/dL — ABNORMAL LOW (ref 250–450)
UIBC: 215 ug/dL (ref 111–343)
Vitamin B-12: 928 pg/mL (ref 232–1245)
WBC: 8 10*3/uL (ref 3.4–10.8)

## 2019-03-19 LAB — CMP14+EGFR
ALT: 16 IU/L (ref 0–44)
AST: 21 IU/L (ref 0–40)
Albumin/Globulin Ratio: 1.7 (ref 1.2–2.2)
Albumin: 4 g/dL (ref 3.6–4.6)
Alkaline Phosphatase: 78 IU/L (ref 39–117)
BUN/Creatinine Ratio: 13 (ref 10–24)
BUN: 20 mg/dL (ref 8–27)
Bilirubin Total: 0.8 mg/dL (ref 0.0–1.2)
CO2: 24 mmol/L (ref 20–29)
Calcium: 9.3 mg/dL (ref 8.6–10.2)
Chloride: 101 mmol/L (ref 96–106)
Creatinine, Ser: 1.49 mg/dL — ABNORMAL HIGH (ref 0.76–1.27)
GFR calc Af Amer: 48 mL/min/{1.73_m2} — ABNORMAL LOW (ref >59)
GFR calc non Af Amer: 41 mL/min/{1.73_m2} — ABNORMAL LOW (ref >59)
Globulin, Total: 2.3 g/dL (ref 1.5–4.5)
Glucose: 105 mg/dL — ABNORMAL HIGH (ref 65–99)
Potassium: 3.5 mmol/L (ref 3.5–5.2)
Sodium: 141 mmol/L (ref 134–144)
Total Protein: 6.3 g/dL (ref 6.0–8.5)

## 2019-03-22 MED ORDER — IRON (FERROUS SULFATE) 325 (65 FE) MG PO TABS: 1.0000 | ORAL_TABLET | ORAL | 6 refills | Status: DC

## 2019-03-22 NOTE — Addendum Note (Signed)
Addended by: Baruch Gouty on: 03/22/2019 07:47 PM   Modules accepted: Orders

## 2019-03-30 ENCOUNTER — Other Ambulatory Visit: Payer: Self-pay

## 2019-03-30 ENCOUNTER — Ambulatory Visit (INDEPENDENT_AMBULATORY_CARE_PROVIDER_SITE_OTHER): Payer: MEDICARE

## 2019-03-30 ENCOUNTER — Ambulatory Visit (INDEPENDENT_AMBULATORY_CARE_PROVIDER_SITE_OTHER): Payer: MEDICARE | Admitting: Family Medicine

## 2019-03-30 ENCOUNTER — Encounter: Payer: Self-pay | Admitting: Family Medicine

## 2019-03-30 VITALS — BP 135/66 | HR 79 | Temp 97.3°F | Ht 71.0 in | Wt 157.6 lb

## 2019-03-30 DIAGNOSIS — M25552 Pain in left hip: Secondary | ICD-10-CM | POA: Diagnosis not present

## 2019-03-30 DIAGNOSIS — W19XXXA Unspecified fall, initial encounter: Secondary | ICD-10-CM

## 2019-03-30 DIAGNOSIS — S79912A Unspecified injury of left hip, initial encounter: Secondary | ICD-10-CM | POA: Diagnosis not present

## 2019-03-30 NOTE — Progress Notes (Signed)
BP 135/66   Pulse 79   Temp (!) 97.3 F (36.3 C) (Temporal)   Ht 5\' 11"  (1.803 m)   Wt 157 lb 9.6 oz (71.5 kg)   SpO2 96%   BMI 21.98 kg/m    Subjective:   Patient ID: Jon Reid, male    DOB: 04/25/1928, 86 y.o.   MRN: [id number]  HPI: Jon Reid is a 86 y.o. male presenting on 03/30/2019 for Hip Pain (Patient is c/o left hip pain after a fall on Friday.)   HPI Left hip pain after fall 3 days ago Patient comes in today complaining of left hip pain after fall 3 days ago.  He says he was at his work and he was trying to go out the door and somehow fell and landed on that left side and has been having pain mostly when he is standing on it that comes from the lateral aspect of the hip around into the front into the groin.  He denies any pain anywhere in his back or going down his leg or any numbness or weakness.  He is more unstable because of age and uses a walker all the time.  He denies any pain at rest  Relevant past medical, surgical, family and social history reviewed and updated as indicated. Interim medical history since our last visit reviewed. Allergies and medications reviewed and updated.  Review of Systems  Constitutional: Negative for chills and fever.  Respiratory: Negative for shortness of breath and wheezing.   Cardiovascular: Negative for chest pain and leg swelling.  Musculoskeletal: Positive for arthralgias and gait problem. Negative for back pain and joint swelling.  Skin: Negative for rash.  All other systems reviewed and are negative.   Per HPI unless specifically indicated above   Allergies as of 03/30/2019      Reactions   Crestor [rosuvastatin]    GYNECOMASTIA AND SWELLING      Medication List       Accurate as of March 30, 2019 11:25 AM. If you have any questions, ask your nurse or doctor.        aspirin EC 81 MG tablet Take 81 mg by mouth daily.   diclofenac sodium 1 % Gel Commonly known as: VOLTAREN Apply 2 g topically 4  (four) times daily.   fluticasone 50 MCG/ACT nasal spray Commonly known as: FLONASE One to 2 sprays each nostril at bedtime   furosemide 40 MG tablet Commonly known as: LASIX Take 1 tablet (40 mg total) by mouth daily.   Iron (Ferrous Sulfate) 325 (65 Fe) MG Tabs Take 1 tablet by mouth every Monday, Wednesday, and Friday.   methocarbamol 500 MG tablet Commonly known as: ROBAXIN Take 1 tablet (500 mg total) by mouth every 6 (six) hours as needed for muscle spasms.   nitroGLYCERIN 0.4 MG SL tablet Commonly known as: NITROSTAT Place 1 tablet (0.4 mg total) under the tongue every 5 (five) minutes as needed for chest pain.   pantoprazole 40 MG tablet Commonly known as: PROTONIX Take 1 tablet (40 mg total) by mouth daily. For stomach   triamcinolone cream 0.1 % Commonly known as: KENALOG Apply 1 application topically 2 (two) times daily. What changed:   when to take this  reasons to take this   Vitamin D 50 MCG (2000 UT) Caps Take 2,000 Units by mouth daily.        Objective:   BP 135/66   Pulse 79   Temp (!) 97.3 F (36.3  C) (Temporal)   Ht 5\' 11"  (1.803 m)   Wt 157 lb 9.6 oz (71.5 kg)   SpO2 96%   BMI 21.98 kg/m   Wt Readings from Last 3 Encounters:  03/30/19 157 lb 9.6 oz (71.5 kg)  03/18/19 157 lb (71.2 kg)  02/05/19 159 lb 11.2 oz (72.4 kg)    Physical Exam Vitals and nursing note reviewed.  Constitutional:      General: He is not in acute distress.    Appearance: He is well-developed. He is not diaphoretic.  Eyes:     General: No scleral icterus.    Conjunctiva/sclera: Conjunctivae normal.  Musculoskeletal:        General: Normal range of motion.     Lumbar back: Normal. No swelling, deformity or tenderness.     Left hip: Normal. No deformity, tenderness (No tenderness to palpation) or bony tenderness. Normal range of motion.  Skin:    General: Skin is warm and dry.     Findings: No rash.  Neurological:     Mental Status: He is alert and  oriented to person, place, and time.     Coordination: Coordination normal.  Psychiatric:        Behavior: Behavior normal.     Left hip x-ray: No signs of acute bony abnormality, arthritis in the hip, await final read from radiologist  Assessment & Plan:   Problem List Items Addressed This Visit    None    Visit Diagnoses    Fall, initial encounter    -  Primary   Relevant Orders   DG HIP UNILAT WITH PELVIS 2-3 VIEWS LEFT   Left hip pain          X-ray looks normal and recommended that he use conservative management with stretching exercises and the occasional anti-inflammatory and Tylenol.  He also has Voltaren gel. Follow up plan: Return if symptoms worsen or fail to improve.  Counseling provided for all of the vaccine components Orders Placed This Encounter  Procedures  . DG HIP UNILAT WITH PELVIS 2-3 VIEWS LEFT    Caryl Pina, MD [facility] 03/30/2019, 11:25 AM

## 2019-04-06 DIAGNOSIS — Z7189 Other specified counseling: Secondary | ICD-10-CM | POA: Insufficient documentation

## 2019-04-06 NOTE — Progress Notes (Deleted)
Cardiology Office Note   Date:  04/06/2019   ID:  Jon Reid, DOB [birth year], MRN [id number]  PCP:  Baruch Gouty, FNP  Cardiologist:   Minus Breeding, MD  Referring:  Baruch Gouty, FNP  No chief complaint on file.     History of Present Illness: Jon Reid is a 86 y.o. male who presents for follow up of CAD.  He had CABG in 2014.  In 2017 he had a negative stress perfusion study.   He had back surgery since I last saw him.  ***    Since I last saw him he has had progressive leg weakness.  He has had several falls because of this.  He is being considered for back surgery as this might be the etiology.  He has had no cardiac complaints per se. The patient denies any new symptoms such as chest discomfort, neck or arm discomfort. There has been no new shortness of breath, PND or orthopnea. There have been no reported palpitations, presyncope or syncope.  Clearly his falls are mechanical and not a loss of consciousness.  He has had about a 20 pound weight loss but says he is eating okay.   Past Medical History:  Diagnosis Date  . Arthritis   . Carcinoma of prostate [facility])    prostate  . CKD (chronic kidney disease) stage 2, GFR 60-89 ml/min   . Coronary artery disease    a. LHC (8/14):  3v CAD => s/p CABG (LIMA-LAD, SVG-OM1, SVG-PDA)  . First degree AV block 01/28/2019   Noted on EKG  . GERD (gastroesophageal reflux disease)   . Gout   . HTN (hypertension)   . Hx of echocardiogram    Echocardiogram 11/13/12: EF 55-60%, normal wall motion, biatrial enlargement, mild AI, mild TR, no pericardial effusion  . Moderate aortic stenosis 12/06/2017   Noted on ECHO  . 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 Sleep apnea    Questionable  . Spinal stenosis     Past Surgical History:  Procedure Laterality Date  . CARDIAC CATHETERIZATION  11/07/2012   Dr Acie Fredrickson  . CATARACT EXTRACTION W/ INTRAOCULAR LENS IMPLANT Bilateral   .  CORONARY ARTERY BYPASS GRAFT N/A 11/11/2012   Procedure: CORONARY ARTERY BYPASS GRAFTING times four using Right Greater Saphenous Vein Graft harvested endoscopically and Left Internal Mammary Artery.;  Surgeon: Ivin Poot, MD;  Location: [place];  Service: Open Heart Surgery;  Laterality: N/A;  . INGUINAL HERNIA REPAIR Right 01/10/2017   Procedure: OPEN RIGHT HERNIA REPAIR INGUINAL;  Surgeon: Ileana Roup, MD;  Location: [facility];  Service: General;  Laterality: Right;  . INSERTION OF MESH Right 01/10/2017   Procedure: INSERTION OF MESH;  Surgeon: Ileana Roup, MD;  Location: [facility];  Service: General;  Laterality: Right;  . INTRAOPERATIVE TRANSESOPHAGEAL ECHOCARDIOGRAM N/A 11/11/2012   Procedure: INTRAOPERATIVE TRANSESOPHAGEAL ECHOCARDIOGRAM;  Surgeon: Ivin Poot, MD;  Location: [place];  Service: Open Heart Surgery;  Laterality: N/A;  . LEFT HEART CATHETERIZATION WITH CORONARY ANGIOGRAM N/A 11/07/2012   Procedure: LEFT HEART CATHETERIZATION WITH CORONARY ANGIOGRAM;  Surgeon: Thayer Headings, MD;  Location: [facility] C[facility] LAB;  Service: Cardiovascular;  Laterality: N/A;  . LUMBAR LAMINECTOMY/DECOMPRESSION MICRODISCECTOMY N/A 02/05/2019   Procedure: LUMBAR LAMINECTOMY/DECOMPRESSION L3-L4;  Surgeon: Latanya Maudlin, MD;  Location: [facility];  Service: Orthopedics;  Laterality: N/A;  77min  . LYMPH NODE DISSECTION  Bilateral pelvic  . RETROPUBIC PROSTATECTOMY     Radical     Current Outpatient Medications  Medication Sig Dispense Refill  . aspirin EC 81 MG tablet Take 81 mg by mouth daily.    . Cholecalciferol (VITAMIN D) 2000 units CAPS Take 2,000 Units by mouth daily.    . diclofenac sodium (VOLTAREN) 1 % GEL Apply 2 g topically 4 (four) times daily. 150 g 3  . fluticasone (FLONASE) 50 MCG/ACT nasal spray One to 2 sprays each nostril at bedtime 16 g 6  . furosemide (LASIX) 40 MG tablet Take 1 tablet (40 mg total) by mouth daily. 90 tablet 0  . Iron, Ferrous Sulfate, 325 (65 Fe) MG  TABS Take 1 tablet by mouth every Monday, Wednesday, and Friday. 30 tablet 6  . methocarbamol (ROBAXIN) 500 MG tablet Take 1 tablet (500 mg total) by mouth every 6 (six) hours as needed for muscle spasms. 40 tablet 0  . nitroGLYCERIN (NITROSTAT) 0.4 MG SL tablet Place 1 tablet (0.4 mg total) under the tongue every 5 (five) minutes as needed for chest pain. 25 tablet PRN  . pantoprazole (PROTONIX) 40 MG tablet Take 1 tablet (40 mg total) by mouth daily. For stomach 30 tablet 11  . triamcinolone cream (KENALOG) 0.1 % Apply 1 application topically 2 (two) times daily. (Patient taking differently: Apply 1 application topically daily as needed (itching). ) 30 g 3   Current Facility-Administered Medications  Medication Dose Route Frequency Provider Last Rate Last Admin  . cyanocobalamin ((VITAMIN B-12)) injection 1,000 mcg  1,000 mcg Intramuscular Q30 days Baruch Gouty, FNP   1,000 mcg at 02/16/19 1640    Allergies:   Crestor [rosuvastatin]    ROS:  Please see the history of present illness.   Otherwise, review of systems are positive for ***.   All other systems are reviewed and negative.    PHYSICAL EXAM: VS:  There were no vitals taken for this visit. , BMI There is no height or weight on file to calculate BMI. GENERAL:  Well appearing NECK:  No jugular venous distention, waveform within normal limits, carotid upstroke brisk and symmetric, no bruits, no thyromegaly LUNGS:  Clear to auscultation bilaterally CHEST:  Well healed sternotomy scar. HEART:  PMI not displaced or sustained,S1 and S2 within normal limits, no S3, no S4, no clicks, no rubs, *** murmurs ABD:  Flat, positive bowel sounds normal in frequency in pitch, no bruits, no rebound, no guarding, no midline pulsatile mass, no hepatomegaly, no splenomegaly EXT:  2 plus pulses throughout, no edema, no cyanosis no clubbing     ***GENERAL: Slightly frail appearing NECK:  No jugular venous distention, waveform within normal limits,  carotid upstroke brisk and symmetric, no bruits, no thyromegaly LUNGS:  Clear to auscultation bilaterally CHEST:  Unremarkable HEART:  PMI not displaced or sustained,S1 and S2 within normal limits, no S3, no S4, no clicks, no rubs, apical early peaking systolic murmur, no diastolic murmurs ABD:  Flat, positive bowel sounds normal in frequency in pitch, no bruits, no rebound, no guarding, no midline pulsatile mass, no hepatomegaly, no splenomegaly EXT:  2 plus pulses throughout, no edema, no cyanosis no clubbing NEURO: Diffuse lower extremity weakness.   EKG:  EKG is ***  ordered today. The ekg ordered ***demonstrates NSR, LAD, new right bundle branch block, first-degree AV block rate ***.  Right bundle branch block is new since previous.   Recent Labs: 10/23/2018: TSH 2.180 02/10/2019: Magnesium 2.0 03/18/2019: ALT 16; BUN 20;  Creatinine, Ser 1.49; Hemoglobin 10.8; Platelets 290; Potassium 3.5; Sodium 141    Lipid Panel    Component Value Date/Time   CHOL 148 05/09/2018 1036   CHOL 173 11/06/2012 1058   TRIG 50 05/09/2018 1036   TRIG 55 07/07/2015 0945   TRIG 73 11/06/2012 1058   HDL 62 05/09/2018 1036   HDL 54 07/07/2015 0945   HDL 66 11/06/2012 1058   CHOLHDL 2.4 05/09/2018 1036   CHOLHDL 2.5 11/08/2012 0500   VLDL 11 11/08/2012 0500   LDLCALC 76 05/09/2018 1036   LDLCALC 92 09/03/2013 0959   LDLCALC 92 11/06/2012 1058      Wt Readings from Last 3 Encounters:  03/30/19 157 lb 9.6 oz (71.5 kg)  03/18/19 157 lb (71.2 kg)  02/05/19 159 lb 11.2 oz (72.4 kg)      Other studies Reviewed: Additional studies/ records that were reviewed today include: *** Review of the above records demonstrates:  ***  Lab Results  Component Value Date   TSH 2.180 10/23/2018   Lab Results  Component Value Date   CREATININE 1.49 (H) 03/18/2019   ASSESSMENT AND PLAN:   Hx of CABG *** He has no symptoms since his stress test in 2017.  No change in therapy. work-up is  planned.  Benign essential HTN The blood pressure is *** at target and actually running slightly low.  I will adjust meds as below.   Chronic renal insufficiency, stage III (moderate) Creatinine was 1.5 *** recently which is stable.  No change in therapy.   AS This was moderate on echo in 2019.  *** I would not suspect that this has changed clinically.  No change in therapy.   MR  Mild on echo last year.  ***  I will follow this clinically.     Hyperlipemia LDL was *** 76.  Continue current therapy.   Bifasicular block *** I discussed with him the need to let me know if he has any presyncope or syncope going forward.  He said he has conduction disease.  Having stopped his beta-blocker.  At this point there is no indication for pacing  Covid education ***  Current medicines are reviewed at length with the patient today.  The patient does not have concerns regarding medicines.  The following changes have been made:  None  Labs/ tests ordered today include:   No orders of the defined types were placed in this encounter.    Disposition:   FU with me in 12 months   Signed, Minus Breeding, MD  04/06/2019 10:09 PM    [place]

## 2019-04-08 ENCOUNTER — Ambulatory Visit: Payer: MEDICARE | Admitting: Cardiology

## 2019-04-11 ENCOUNTER — Other Ambulatory Visit: Payer: Self-pay | Admitting: Family Medicine

## 2019-04-30 DIAGNOSIS — R601 Generalized edema: Secondary | ICD-10-CM | POA: Diagnosis not present

## 2019-04-30 DIAGNOSIS — L03125 Acute lymphangitis of right lower limb: Secondary | ICD-10-CM | POA: Diagnosis not present

## 2019-04-30 DIAGNOSIS — M79671 Pain in right foot: Secondary | ICD-10-CM | POA: Diagnosis not present

## 2019-05-05 ENCOUNTER — Other Ambulatory Visit: Payer: Self-pay

## 2019-05-05 ENCOUNTER — Other Ambulatory Visit: Payer: MEDICARE

## 2019-05-05 DIAGNOSIS — D649 Anemia, unspecified: Secondary | ICD-10-CM | POA: Diagnosis not present

## 2019-05-11 ENCOUNTER — Other Ambulatory Visit: Payer: Self-pay | Admitting: Family Medicine

## 2019-05-26 ENCOUNTER — Other Ambulatory Visit: Payer: Self-pay

## 2019-05-26 DIAGNOSIS — I444 Left anterior fascicular block: Secondary | ICD-10-CM | POA: Insufficient documentation

## 2019-05-26 DIAGNOSIS — I340 Nonrheumatic mitral (valve) insufficiency: Secondary | ICD-10-CM | POA: Insufficient documentation

## 2019-05-26 NOTE — Progress Notes (Signed)
Cardiology Office Note   Date:  05/27/2019   ID:  Jon Reid, DOB Aug 03, 1928, MRN [id number]  PCP:  Baruch Gouty, FNP  Cardiologist:   Minus Breeding, MD  Referring:  Baruch Gouty, FNP  Chief Complaint  Patient presents with  . Coronary Artery Disease      History of Present Illness: Jon Reid is a 86 y.o. male who presents for follow up of CAD.  He has CABG in 2014.  In 2017 he had a negative stress perfusion study.  Since I last saw him he did have a back procedure which seemed to help.  He has had no new cardiovascular problems.  He walks at [employer].  With this he denies any cardiovascular symptoms. The patient denies any new symptoms such as chest discomfort, neck or arm discomfort. There has been no new shortness of breath, PND or orthopnea. There have been no reported palpitations, presyncope or syncope.  He gets around slowly.   Past Medical History:  Diagnosis Date  . Arthritis   . Carcinoma of prostate [facility])    prostate  . CKD (chronic kidney disease) stage 2, GFR 60-89 ml/min   . Coronary artery disease    a. LHC (8/14):  3v CAD => s/p CABG (LIMA-LAD, SVG-OM1, SVG-PDA)  . First degree AV block 01/28/2019   Noted on EKG  . GERD (gastroesophageal reflux disease)   . Gout   . HTN (hypertension)   . Hx of echocardiogram    Echocardiogram 11/13/12: EF 55-60%, normal wall motion, biatrial enlargement, mild AI, mild TR, no pericardial effusion  . Moderate aortic stenosis 12/06/2017   Noted on ECHO  . 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 Sleep apnea    Questionable  . Spinal stenosis     Past Surgical History:  Procedure Laterality Date  . CARDIAC CATHETERIZATION  11/07/2012   Dr Acie Fredrickson  . CATARACT EXTRACTION W/ INTRAOCULAR LENS IMPLANT Bilateral   . CORONARY ARTERY BYPASS GRAFT N/A 11/11/2012   Procedure: CORONARY ARTERY BYPASS GRAFTING times four using Right Greater Saphenous Vein Graft harvested  endoscopically and Left Internal Mammary Artery.;  Surgeon: Ivin Poot, MD;  Location: [place];  Service: Open Heart Surgery;  Laterality: N/A;  . INGUINAL HERNIA REPAIR Right 01/10/2017   Procedure: OPEN RIGHT HERNIA REPAIR INGUINAL;  Surgeon: Ileana Roup, MD;  Location: [facility];  Service: General;  Laterality: Right;  . INSERTION OF MESH Right 01/10/2017   Procedure: INSERTION OF MESH;  Surgeon: Ileana Roup, MD;  Location[facility];  Service: General;  Laterality: Right;  . INTRAOPERATIVE TRANSESOPHAGEAL ECHOCARDIOGRAM N/A 11/11/2012   Procedure: INTRAOPERATIVE TRANSESOPHAGEAL ECHOCARDIOGRAM;  Surgeon: Ivin Poot, MD;  Location: [place];  Service: Open Heart Surgery;  Laterality: N/A;  . LEFT HEART CATHETERIZATION WITH CORONARY ANGIOGRAM N/A 11/07/2012   Procedure: LEFT HEART CATHETERIZATION WITH CORONARY ANGIOGRAM;  Surgeon: Thayer Headings, MD;  Location: [facility] CATH LAB;  Service: Cardiovascular;  Laterality: N/A;  . LUMBAR LAMINECTOMY/DECOMPRESSION MICRODISCECTOMY N/A 02/05/2019   Procedure: LUMBAR LAMINECTOMY/DECOMPRESSION L3-L4;  Surgeon: Latanya Maudlin, MD;  Location: [facility];  Service: Orthopedics;  Laterality: N/A;  11min  . LYMPH NODE DISSECTION     Bilateral pelvic  . RETROPUBIC PROSTATECTOMY     Radical     Current Outpatient Medications  Medication Sig Dispense Refill  . aspirin EC 81 MG tablet Take 81 mg by mouth  daily.    . Cholecalciferol (VITAMIN D) 2000 units CAPS Take 2,000 Units by mouth daily.    . diclofenac sodium (VOLTAREN) 1 % GEL Apply 2 g topically 4 (four) times daily. 150 g 3  . fluticasone (FLONASE) 50 MCG/ACT nasal spray One to 2 sprays each nostril at bedtime 16 g 6  . furosemide (LASIX) 40 MG tablet TAKE 1 TABLET DAILY 90 tablet 0  . Iron, Ferrous Sulfate, 325 (65 Fe) MG TABS Take 1 tablet by mouth every Monday, Wednesday, and Friday. 30 tablet 6  . methocarbamol (ROBAXIN) 500 MG tablet Take 1 tablet (500 mg total) by mouth every 6 (six)  hours as needed for muscle spasms. 40 tablet 0  . nitroGLYCERIN (NITROSTAT) 0.4 MG SL tablet Place 1 tablet (0.4 mg total) under the tongue every 5 (five) minutes as needed for chest pain. 25 tablet PRN  . pantoprazole (PROTONIX) 40 MG tablet TAKE 1 TABLET ONCE DAILY FOR REFLUX 30 tablet 2  . triamcinolone cream (KENALOG) 0.1 % Apply 1 application topically 2 (two) times daily. (Patient taking differently: Apply 1 application topically daily as needed (itching). ) 30 g 3   Current Facility-Administered Medications  Medication Dose Route Frequency Provider Last Rate Last Admin  . cyanocobalamin ((VITAMIN B-12)) injection 1,000 mcg  1,000 mcg Intramuscular Q30 days Baruch Gouty, FNP   1,000 mcg at 02/16/19 1640    Allergies:   Crestor [rosuvastatin]    ROS:  Please see the history of present illness.   Otherwise, review of systems are positive for 30 pound weight loss in 6 months..   All other systems are reviewed and negative.    PHYSICAL EXAM: VS:  BP 138/72   Pulse 72   Ht 5\' 11"  (1.803 m)   Wt 156 lb (70.8 kg)   BMI 21.76 kg/m  , BMI Body mass index is 21.76 kg/m. GENERAL:  Well appearing NECK:  No jugular venous distention, waveform within normal limits, carotid upstroke brisk and symmetric, no bruits, no thyromegaly LUNGS:  Clear to auscultation bilaterally CHEST:  Well healed sternotomy scar. HEART:  PMI not displaced or sustained,S1 and S2 within normal limits, no S3, no S4, no clicks, no rubs, 2 out of 6 apical systolic murmur radiating at the aortic outflow tract, no diastolic murmurs ABD:  Flat, positive bowel sounds normal in frequency in pitch, no bruits, no rebound, no guarding, no midline pulsatile mass, no hepatomegaly, no splenomegaly EXT:  2 plus pulses throughout, no edema, no cyanosis no clubbing   EKG:  EKG is  Not  ordered today.  Recent Labs: 10/23/2018: TSH 2.180 02/10/2019: Magnesium 2.0 03/18/2019: ALT 16; BUN 20; Creatinine, Ser 1.49; Hemoglobin 10.8;  Platelets 290; Potassium 3.5; Sodium 141    Lipid Panel    Component Value Date/Time   CHOL 148 05/09/2018 1036   CHOL 173 11/06/2012 1058   TRIG 50 05/09/2018 1036   TRIG 55 07/07/2015 0945   TRIG 73 11/06/2012 1058   HDL 62 05/09/2018 1036   HDL 54 07/07/2015 0945   HDL 66 11/06/2012 1058   CHOLHDL 2.4 05/09/2018 1036   CHOLHDL 2.5 11/08/2012 0500   VLDL 11 11/08/2012 0500   LDLCALC 76 05/09/2018 1036   LDLCALC 92 09/03/2013 0959   LDLCALC 92 11/06/2012 1058      Wt Readings from Last 3 Encounters:  05/27/19 156 lb (70.8 kg)  05/27/19 156 lb (70.8 kg)  03/30/19 157 lb 9.6 oz (71.5 kg)  Other studies Reviewed: Additional studies/ records that were reviewed today include: None Review of the above records demonstrates:  NA  Lab Results  Component Value Date   TSH 2.180 10/23/2018   Lab Results  Component Value Date   CREATININE 1.49 (H) 03/18/2019   ASSESSMENT AND PLAN:   Hx of CABG He has no symptoms since his stress test in 2017.  No change in therapy.   Benign essential HTN Blood pressure is well controlled.  He will continue the meds as listed.  Chronic renal insufficiency, stage III (moderate) Creatinine was 1.5 no change in therapy this is stable.   AS We will follow this clinically.  His exam is not changed.  This was moderate in 2019.erapy.   MR  Mild on echo two years ago.  This was mild 2 years ago.  No change in therapy.  Hyperlipemia LDL was 76.  No change in therapy.   Bifasicular block I discussed with him the need to let me know if he has any presyncope or syncope going forward.  I have discussed this with him.  He has not had any syncope.  No change in therapy. Me know if he ever has any presyncope or syncope.  Covid education I tried to discuss vaccination with him he thinks he might try to get it next week.   Current medicines are reviewed at length with the patient today.  The patient does not have concerns regarding  medicines.  The following changes have been made:  None  Labs/ tests ordered today include: None  No orders of the defined types were placed in this encounter.    Disposition:   FU with me in 12 months   Signed, Minus Breeding, MD  05/27/2019 3:44 PM    [facility]

## 2019-05-27 ENCOUNTER — Encounter: Payer: Self-pay | Admitting: Family Medicine

## 2019-05-27 ENCOUNTER — Ambulatory Visit (INDEPENDENT_AMBULATORY_CARE_PROVIDER_SITE_OTHER): Payer: MEDICARE | Admitting: Cardiology

## 2019-05-27 ENCOUNTER — Ambulatory Visit (INDEPENDENT_AMBULATORY_CARE_PROVIDER_SITE_OTHER): Payer: MEDICARE | Admitting: Family Medicine

## 2019-05-27 ENCOUNTER — Encounter: Payer: Self-pay | Admitting: Cardiology

## 2019-05-27 VITALS — BP 138/72 | HR 72 | Ht 71.0 in | Wt 156.0 lb

## 2019-05-27 VITALS — BP 148/73 | HR 62 | Temp 98.2°F | Resp 20 | Ht 71.0 in | Wt 156.0 lb

## 2019-05-27 DIAGNOSIS — I2510 Atherosclerotic heart disease of native coronary artery without angina pectoris: Secondary | ICD-10-CM

## 2019-05-27 DIAGNOSIS — E559 Vitamin D deficiency, unspecified: Secondary | ICD-10-CM | POA: Diagnosis not present

## 2019-05-27 DIAGNOSIS — E7800 Pure hypercholesterolemia, unspecified: Secondary | ICD-10-CM | POA: Diagnosis not present

## 2019-05-27 DIAGNOSIS — I700 Atherosclerosis of aorta: Secondary | ICD-10-CM | POA: Diagnosis not present

## 2019-05-27 DIAGNOSIS — I340 Nonrheumatic mitral (valve) insufficiency: Secondary | ICD-10-CM | POA: Diagnosis not present

## 2019-05-27 DIAGNOSIS — I10 Essential (primary) hypertension: Secondary | ICD-10-CM

## 2019-05-27 DIAGNOSIS — E538 Deficiency of other specified B group vitamins: Secondary | ICD-10-CM

## 2019-05-27 DIAGNOSIS — D500 Iron deficiency anemia secondary to blood loss (chronic): Secondary | ICD-10-CM

## 2019-05-27 DIAGNOSIS — Z7189 Other specified counseling: Secondary | ICD-10-CM

## 2019-05-27 DIAGNOSIS — I350 Nonrheumatic aortic (valve) stenosis: Secondary | ICD-10-CM

## 2019-05-27 DIAGNOSIS — N1832 Chronic kidney disease, stage 3b: Secondary | ICD-10-CM

## 2019-05-27 DIAGNOSIS — E785 Hyperlipidemia, unspecified: Secondary | ICD-10-CM | POA: Diagnosis not present

## 2019-05-27 DIAGNOSIS — N1831 Chronic kidney disease, stage 3a: Secondary | ICD-10-CM

## 2019-05-27 DIAGNOSIS — I739 Peripheral vascular disease, unspecified: Secondary | ICD-10-CM | POA: Diagnosis not present

## 2019-05-27 DIAGNOSIS — I444 Left anterior fascicular block: Secondary | ICD-10-CM | POA: Diagnosis not present

## 2019-05-27 DIAGNOSIS — I129 Hypertensive chronic kidney disease with stage 1 through stage 4 chronic kidney disease, or unspecified chronic kidney disease: Secondary | ICD-10-CM | POA: Diagnosis not present

## 2019-05-27 NOTE — Patient Instructions (Signed)
Medication Instructions:  The current medical regimen is effective;  continue present plan and medications.  *If you need a refill on your cardiac medications before your next appointment, please call your pharmacy*  Follow-Up: At [facility], you and your health needs are our priority.  As part of our continuing mission to provide you with exceptional heart care, we have created designated Provider Care Teams.  These Care Teams include your primary Cardiologist (physician) and Advanced Practice Providers (APPs -  Physician Assistants and Nurse Practitioners) who all work together to provide you with the care you need, when you need it.  We recommend signing up for the patient portal called "MyChart".  Sign up information is provided on this After Visit Summary.  MyChart is used to connect with patients for Virtual Visits (Telemedicine).  Patients are able to view lab/test results, encounter notes, upcoming appointments, etc.  Non-urgent messages can be sent to your provider as well.   To learn more about what you can do with MyChart, go to [email or web address].    Your next appointment:   12 month(s)  The format for your next appointment:   In Person  Provider:   James Hochrein, MD   Thank you for choosing [facility]!!     

## 2019-05-27 NOTE — Progress Notes (Signed)
Subjective:  Patient ID: Jon Reid, male    DOB: Jan 17, 1929, 86 y.o.   MRN: [id number]  Patient Care Team: Baruch Gouty, FNP as PCP - General (Family Medicine) Minus Breeding, MD as PCP - Cardiology (Cardiology) Festus Aloe, MD (Urology) Curt Bears, MD (Hematology and Oncology) Warden Fillers, MD as Consulting Physician (Ophthalmology) Juanita Craver, MD as Consulting Physician (Gastroenterology) Latanya Maudlin, MD as Consulting Physician (Orthopedic Surgery) Kathrynn Ducking, MD as Consulting Physician (Neurology)   Chief Complaint:  Medical Management of Chronic Issues (4 mo ), Hypertension, and Hyperlipidemia   HPI: Jon Reid is a 86 y.o. male presenting on 05/27/2019 for Medical Management of Chronic Issues (4 mo ), Hypertension, and Hyperlipidemia  1. Iron deficiency anemia due to chronic blood loss On iron repletion therapy and tolerating well. No constipation, dark stools, shortness of breath, or chest pain.   2. Abdominal aortic atherosclerosis (HCC) Takes ASA therpay daily. Does try to watch diet and is active daily. Intolerant to statins.   3. Chronic renal impairment, stage 3b No changes in urine output. No edema, weakness, confusion, or fatigue.   4. Benign essential HTN This has been diet controlled and he denies elevated readings at home. No chest pain, shortness of breath, leg swelling, confusion, headaches, or dizziness.   5. Pure hypercholesterolemia Diet controlled as he is statin intolerant. Does try to watch what he eats and he is active on a daily basis.   6. Peripheral vascular disease ([place]) No new or worsening symptoms. No open wounds or edema.   7. Vitamin D deficiency On repletion therapy and tolerating well. Does have arthralgias. Worsening arthralgias or myalgias.   8. Vitamin B12 deficiency On monthly repletion therapy. Has not had in 2 months. Will recheck levels today to see if therapy is still warranted.       Relevant past medical, surgical, family, and social history reviewed and updated as indicated.  Allergies and medications reviewed and updated. Date reviewed: Chart in Epic.   Past Medical History:  Diagnosis Date  . Arthritis   . Carcinoma of prostate [facility])    prostate  . CKD (chronic kidney disease) stage 2, GFR 60-89 ml/min   . Coronary artery disease    a. LHC (8/14):  3v CAD => s/p CABG (LIMA-LAD, SVG-OM1, SVG-PDA)  . First degree AV block 01/28/2019   Noted on EKG  . GERD (gastroesophageal reflux disease)   . Gout   . HTN (hypertension)   . Hx of echocardiogram    Echocardiogram 11/13/12: EF 55-60%, normal wall motion, biatrial enlargement, mild AI, mild TR, no pericardial effusion  . Moderate aortic stenosis 12/06/2017   Noted on ECHO  . 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 Sleep apnea    Questionable  . Spinal stenosis     Past Surgical History:  Procedure Laterality Date  . CARDIAC CATHETERIZATION  11/07/2012   Dr Acie Fredrickson  . CATARACT EXTRACTION W/ INTRAOCULAR LENS IMPLANT Bilateral   . CORONARY ARTERY BYPASS GRAFT N/A 11/11/2012   Procedure: CORONARY ARTERY BYPASS GRAFTING times four using Right Greater Saphenous Vein Graft harvested endoscopically and Left Internal Mammary Artery.;  Surgeon: Ivin Poot, MD;  Location: [place];  Service: Open Heart Surgery;  Laterality: N/A;  . INGUINAL HERNIA REPAIR Right 01/10/2017   Procedure: OPEN RIGHT HERNIA REPAIR INGUINAL;  Surgeon: Ileana Roup, MD;  Location: [facility];  Service: General;  Laterality: Right;  . INSERTION OF MESH Right 01/10/2017   Procedure: INSERTION OF MESH;  Surgeon: Ileana Roup, MD;  Location: [facility];  Service: General;  Laterality: Right;  . INTRAOPERATIVE TRANSESOPHAGEAL ECHOCARDIOGRAM N/A 11/11/2012   Procedure: INTRAOPERATIVE TRANSESOPHAGEAL ECHOCARDIOGRAM;  Surgeon: Ivin Poot, MD;  Location: [place];  Service: Open Heart  Surgery;  Laterality: N/A;  . LEFT HEART CATHETERIZATION WITH CORONARY ANGIOGRAM N/A 11/07/2012   Procedure: LEFT HEART CATHETERIZATION WITH CORONARY ANGIOGRAM;  Surgeon: Thayer Headings, MD;  Location: [facility];  Service: Cardiovascular;  Laterality: N/A;  . LUMBAR LAMINECTOMY/DECOMPRESSION MICRODISCECTOMY N/A 02/05/2019   Procedure: LUMBAR LAMINECTOMY/DECOMPRESSION L3-L4;  Surgeon: Latanya Maudlin, MD;  Location: [facility];  Service: Orthopedics;  Laterality: N/A;  93mn  . LYMPH NODE DISSECTION     Bilateral pelvic  . RETROPUBIC PROSTATECTOMY     Radical    Social History   Socioeconomic History  . Marital status: Married    Spouse name: [name] . Number of children: 3  . Years of education: 146 . Highest education level: High school graduate  Occupational History  . Occupation: Retired  Tobacco Use  . Smoking status: Never Smoker  . Smokeless tobacco: Never Used  Substance and Sexual Activity  . Alcohol use: Yes    Comment: Rare  . Drug use: No  . Sexual activity: Not Currently  Other Topics Concern  . Not on file  Social History Narrative   Married with 3 children   Denies caffeine use    Social Determinants of Health   Financial Resource Strain: Low Risk   . Difficulty of Paying Living Expenses: Not hard at all  Food Insecurity: No Food Insecurity  . Worried About RCharity fundraiserin the Last Year: Never true  . Ran Out of Food in the Last Year: Never true  Transportation Needs: No Transportation Needs  . Lack of Transportation (Medical): No  . Lack of Transportation (Non-Medical): No  Physical Activity: Sufficiently Active  . Days of Exercise per Week: 7 days  . Minutes of Exercise per Session: 30 min  Stress: No Stress Concern Present  . Feeling of Stress : Not at all  Social Connections: Somewhat Isolated  . Frequency of Communication with Friends and Family: More than three times a week  . Frequency of Social Gatherings with Friends and Family: More than  three times a week  . Attends Religious Services: Never  . Active Member of Clubs or Organizations: No  . Attends CArchivistMeetings: Never  . Marital Status: Married  IHuman resources officerViolence: Not At Risk  . Fear of Current or Ex-Partner: No  . Emotionally Abused: No  . Physically Abused: No  . Sexually Abused: No    Outpatient Encounter Medications as of 05/27/2019  Medication Sig  . aspirin EC 81 MG tablet Take 81 mg by mouth daily.  . Cholecalciferol (VITAMIN D) 2000 units CAPS Take 2,000 Units by mouth daily.  . diclofenac sodium (VOLTAREN) 1 % GEL Apply 2 g topically 4 (four) times daily.  . fluticasone (FLONASE) 50 MCG/ACT nasal spray One to 2 sprays each nostril at bedtime  . furosemide (LASIX) 40 MG tablet TAKE 1 TABLET DAILY  . Iron, Ferrous Sulfate, 325 (65 Fe) MG TABS Take 1 tablet by mouth every Monday, Wednesday, and Friday.  . methocarbamol (ROBAXIN) 500 MG tablet Take 1 tablet (500 mg total) by mouth every 6 (six) hours as needed for muscle  spasms.  . pantoprazole (PROTONIX) 40 MG tablet TAKE 1 TABLET ONCE DAILY FOR REFLUX  . triamcinolone cream (KENALOG) 0.1 % Apply 1 application topically 2 (two) times daily. (Patient taking differently: Apply 1 application topically daily as needed (itching). )  . nitroGLYCERIN (NITROSTAT) 0.4 MG SL tablet Place 1 tablet (0.4 mg total) under the tongue every 5 (five) minutes as needed for chest pain. (Patient not taking: Reported on 05/27/2019)   Facility-Administered Encounter Medications as of 05/27/2019  Medication  . cyanocobalamin ((VITAMIN B-12)) injection 1,000 mcg    Allergies  Allergen Reactions  . Crestor [Rosuvastatin]     GYNECOMASTIA AND SWELLING    Review of Systems  Constitutional: Positive for fatigue. Negative for activity change, appetite change, chills, diaphoresis, fever and unexpected weight change.  HENT: Negative.   Eyes: Negative.  Negative for photophobia and visual disturbance.   Respiratory: Negative for cough and chest tightness.   Cardiovascular: Negative for leg swelling.  Gastrointestinal: Negative for abdominal pain, blood in stool, constipation, diarrhea, nausea and vomiting.  Endocrine: Negative.  Negative for cold intolerance, heat intolerance, polydipsia, polyphagia and polyuria.  Genitourinary: Negative for decreased urine volume, difficulty urinating, dysuria, frequency and urgency.  Musculoskeletal: Positive for arthralgias, back pain, gait problem and joint swelling. Negative for neck stiffness.  Skin: Negative.   Allergic/Immunologic: Negative.   Neurological: Positive for weakness (generalized). Negative for dizziness, tremors, seizures, syncope, facial asymmetry, speech difficulty, light-headedness and numbness.  Hematological: Negative.   Psychiatric/Behavioral: Negative for confusion, hallucinations, sleep disturbance and suicidal ideas.  All other systems reviewed and are negative.       Objective:  BP (!) 148/73   Pulse 62   Temp 98.2 F (36.8 C)   Resp 20   Ht '5\' 11"'$  (1.803 m)   Wt 156 lb (70.8 kg)   SpO2 98%   BMI 21.76 kg/m    Wt Readings from Last 3 Encounters:  05/27/19 156 lb (70.8 kg)  03/30/19 157 lb 9.6 oz (71.5 kg)  03/18/19 157 lb (71.2 kg)    Physical Exam Vitals and nursing note reviewed.  Constitutional:      General: He is not in acute distress.    Appearance: Normal appearance. He is well-developed, well-groomed and normal weight. He is not ill-appearing, toxic-appearing or diaphoretic.  HENT:     Head: Normocephalic and atraumatic.     Jaw: There is normal jaw occlusion.     Right Ear: Hearing normal.     Left Ear: Hearing normal.     Nose: Nose normal.     Mouth/Throat:     Lips: Pink.     Mouth: Mucous membranes are moist.     Pharynx: Oropharynx is clear. Uvula midline.  Eyes:     General: Lids are normal.     Extraocular Movements: Extraocular movements intact.     Conjunctiva/sclera: Conjunctivae  normal.     Pupils: Pupils are equal, round, and reactive to light.  Neck:     Thyroid: No thyroid mass, thyromegaly or thyroid tenderness.     Vascular: No carotid bruit or JVD.     Trachea: Trachea and phonation normal.  Cardiovascular:     Rate and Rhythm: Normal rate and regular rhythm.     Chest Wall: PMI is not displaced.     Pulses: Normal pulses.     Heart sounds: Murmur present. Systolic murmur present with a grade of 2/6. No friction rub. No gallop.   Pulmonary:     Effort: Pulmonary  effort is normal. No respiratory distress.     Breath sounds: Normal breath sounds. No wheezing.  Abdominal:     General: Bowel sounds are normal. There is no distension or abdominal bruit.     Palpations: Abdomen is soft. There is no hepatomegaly or splenomegaly.     Tenderness: There is no abdominal tenderness. There is no right CVA tenderness or left CVA tenderness.     Hernia: No hernia is present.  Musculoskeletal:     Cervical back: Normal, normal range of motion and neck supple.     Thoracic back: No swelling, edema, deformity, signs of trauma, lacerations, spasms, tenderness or bony tenderness. Decreased range of motion. No scoliosis.     Lumbar back: Tenderness present. No swelling, edema, deformity, signs of trauma, lacerations, spasms or bony tenderness. Decreased range of motion. Negative right straight leg raise test and negative left straight leg raise test. No scoliosis.     Right hip: Normal.     Left hip: Normal.     Right knee: No swelling, deformity, effusion, erythema, ecchymosis or bony tenderness. Decreased range of motion. Tenderness present.     Left knee: No swelling, deformity, effusion, erythema, ecchymosis or bony tenderness. Decreased range of motion. Tenderness present.     Right lower leg: No edema.     Left lower leg: No edema.     Right ankle: Normal.     Left ankle: Normal.     Comments: kyphosis  Lymphadenopathy:     Cervical: No cervical adenopathy.  Skin:     General: Skin is warm and dry.     Capillary Refill: Capillary refill takes less than 2 seconds.     Coloration: Skin is not cyanotic, jaundiced or pale.     Findings: No bruising, erythema or rash.  Neurological:     General: No focal deficit present.     Mental Status: He is alert and oriented to person, place, and time.     Cranial Nerves: Cranial nerves are intact. No cranial nerve deficit.     Sensory: Sensation is intact. No sensory deficit.     Motor: Motor function is intact. No weakness.     Coordination: Coordination is intact. Coordination normal.     Gait: Gait abnormal (antalgic, uses cane and walker).     Deep Tendon Reflexes: Reflexes are normal and symmetric. Reflexes normal.  Psychiatric:        Attention and Perception: Attention and perception normal.        Mood and Affect: Mood and affect normal.        Speech: Speech normal.        Behavior: Behavior normal. Behavior is cooperative.        Thought Content: Thought content normal.        Cognition and Memory: Cognition and memory normal.        Judgment: Judgment normal.     Results for orders placed or performed in visit on 03/18/19  Anemia Profile with CBC  Result Value Ref Range   Total Iron Binding Capacity 248 (L) 250 - 450 ug/dL   UIBC 215 111 - 343 ug/dL   Iron 33 (L) 38 - 169 ug/dL   Iron Saturation 13 (L) 15 - 55 %   Ferritin 164 30 - 400 ng/mL   Vitamin B-12 928 232 - 1,245 pg/mL   Folate 9.7 >3.0 ng/mL   WBC 8.0 3.4 - 10.8 x10E3/uL   RBC 3.38 (L) 4.14 - 5.80 x10E6/uL  Hemoglobin 10.8 (L) 13.0 - 17.7 g/dL   Hematocrit 32.1 (L) 37.5 - 51.0 %   MCV 95 79 - 97 fL   MCH 32.0 26.6 - 33.0 pg   MCHC 33.6 31.5 - 35.7 g/dL   RDW 15.4 11.6 - 15.4 %   Platelets 290 150 - 450 x10E3/uL   Neutrophils 64 Not Estab. %   Lymphs 23 Not Estab. %   Monocytes 11 Not Estab. %   Eos 1 Not Estab. %   Basos 1 Not Estab. %   Neutrophils Absolute 5.1 1.4 - 7.0 x10E3/uL   Lymphocytes Absolute 1.8 0.7 - 3.1  x10E3/uL   Monocytes Absolute 0.9 0.1 - 0.9 x10E3/uL   EOS (ABSOLUTE) 0.1 0.0 - 0.4 x10E3/uL   Basophils Absolute 0.0 0.0 - 0.2 x10E3/uL   Immature Granulocytes 0 Not Estab. %   Immature Grans (Abs) 0.0 0.0 - 0.1 x10E3/uL   Retic Ct Pct 1.3 0.6 - 2.6 %  CMP14+EGFR  Result Value Ref Range   Glucose 105 (H) 65 - 99 mg/dL   BUN 20 8 - 27 mg/dL   Creatinine, Ser 1.49 (H) 0.76 - 1.27 mg/dL   GFR calc non Af Amer 41 (L) >59 mL/min/1.73   GFR calc Af Amer 48 (L) >59 mL/min/1.73   BUN/Creatinine Ratio 13 10 - 24   Sodium 141 134 - 144 mmol/L   Potassium 3.5 3.5 - 5.2 mmol/L   Chloride 101 96 - 106 mmol/L   CO2 24 20 - 29 mmol/L   Calcium 9.3 8.6 - 10.2 mg/dL   Total Protein 6.3 6.0 - 8.5 g/dL   Albumin 4.0 3.6 - 4.6 g/dL   Globulin, Total 2.3 1.5 - 4.5 g/dL   Albumin/Globulin Ratio 1.7 1.2 - 2.2   Bilirubin Total 0.8 0.0 - 1.2 mg/dL   Alkaline Phosphatase 78 39 - 117 IU/L   AST 21 0 - 40 IU/L   ALT 16 0 - 44 IU/L       Pertinent labs & imaging results that were available during my care of the patient were reviewed by me and considered in my medical decision making.  Assessment & Plan:  Vash was seen today for medical management of chronic issues, hypertension and hyperlipidemia.  Diagnoses and all orders for this visit:  Iron deficiency anemia due to chronic blood loss Continue repletion therapy. Labs pending.  -     Anemia Profile B  Abdominal aortic atherosclerosis (HCC) Continue ASA therapy. Labs pending.  -     CMP14+EGFR -     Lipid panel  Chronic renal impairment, stage 3b No new or worsening symptoms. Labs pending.  -     CMP14+EGFR -     Anemia Profile B  Benign essential HTN BP fairly controlled. Diet controlled. Goal BP is 140/90. Pt aware to report any persistent high or low readings. DASH diet and exercise encouraged. Exercise at least 150 minutes per week and increase as tolerated. Goal BMI > 25. Stress management encouraged. Avoid nicotine and tobacco  product use. Avoid excessive alcohol and NSAID's. Avoid more than 2000 mg of sodium daily. Follow up as scheduled.  -     CMP14+EGFR -     Lipid panel -     Thyroid Panel With TSH -     Anemia Profile B  Pure hypercholesterolemia Diet encouraged - increase intake of fresh fruits and vegetables, increase intake of lean proteins. Bake, broil, or grill foods. Avoid fried, greasy, and fatty foods. Avoid fast  foods. Increase intake of fiber-rich whole grains. Exercise encouraged - at least 150 minutes per week and advance as tolerated.  Goal BMI < 25. Follow up in 3-6 months as discussed.  -     CMP14+EGFR -     Lipid panel  Peripheral vascular disease ([place]) No new or worsening symptoms. Continue current medications. Labs pending. Discussed using compression hose more frequently.  -     CMP14+EGFR -     Lipid panel  Vitamin D deficiency Labs pending. Continue repletion therapy. If indicated, will change repletion dosage. Eat foods rich in Vit D including milk, orange juice, yogurt with vitamin D added, salmon or mackerel, canned tuna fish, cereals with vitamin D added, and cod liver oil. Get out in the sun but make sure to wear at least SPF 30 sunscreen.  -     Anemia Profile B -     VITAMIN D 25 Hydroxy (Vit-D Deficiency, Fractures)  Vitamin B12 deficiency No recent repletion therapy. Will check below today and reinitiate monthly repletion if warranted.  -     Anemia Profile B -     Vitamin B12     Continue all other maintenance medications.  Follow up plan: Return in about 4 months (around 09/26/2019), or if symptoms worsen or fail to improve.  Continue healthy lifestyle choices, including diet (rich in fruits, vegetables, and lean proteins, and low in salt and simple carbohydrates) and exercise (at least 30 minutes of moderate physical activity daily).  Educational handout given for fall prevention in the home  The above assessment and management plan was discussed with the  patient. The patient verbalized understanding of and has agreed to the management plan. Patient is aware to call the clinic if they develop any new symptoms or if symptoms persist or worsen. Patient is aware when to return to the clinic for a follow-up visit. Patient educated on when it is appropriate to go to the emergency department.   Monia Pouch, FNP-C [facility] [phone]

## 2019-05-27 NOTE — Patient Instructions (Signed)

## 2019-05-28 LAB — ANEMIA PROFILE B
Basophils Absolute: 0.1 10*3/uL (ref 0.0–0.2)
Basos: 1 %
EOS (ABSOLUTE): 0.1 10*3/uL (ref 0.0–0.4)
Eos: 2 %
Ferritin: 93 ng/mL (ref 30–400)
Folate: 9.6 ng/mL (ref >3.0)
Hematocrit: 33.5 % — ABNORMAL LOW (ref 37.5–51.0)
Hemoglobin: 11.2 g/dL — ABNORMAL LOW (ref 13.0–17.7)
Immature Grans (Abs): 0 10*3/uL (ref 0.0–0.1)
Immature Granulocytes: 0 %
Iron Saturation: 72 % — ABNORMAL HIGH (ref 15–55)
Iron: 179 ug/dL — ABNORMAL HIGH (ref 38–169)
Lymphocytes Absolute: 1.9 10*3/uL (ref 0.7–3.1)
Lymphs: 32 %
MCH: 31.7 pg (ref 26.6–33.0)
MCHC: 33.4 g/dL (ref 31.5–35.7)
MCV: 95 fL (ref 79–97)
Monocytes Absolute: 0.5 10*3/uL (ref 0.1–0.9)
Monocytes: 8 %
Neutrophils Absolute: 3.5 10*3/uL (ref 1.4–7.0)
Neutrophils: 57 %
Platelets: 259 10*3/uL (ref 150–450)
RBC: 3.53 x10E6/uL — ABNORMAL LOW (ref 4.14–5.80)
RDW: 13.9 % (ref 11.6–15.4)
Retic Ct Pct: 1.4 % (ref 0.6–2.6)
Total Iron Binding Capacity: 248 ug/dL — ABNORMAL LOW (ref 250–450)
UIBC: 69 ug/dL — ABNORMAL LOW (ref 111–343)
Vitamin B-12: 569 pg/mL (ref 232–1245)
WBC: 6.1 10*3/uL (ref 3.4–10.8)

## 2019-05-28 LAB — CMP14+EGFR
ALT: 12 IU/L (ref 0–44)
AST: 21 IU/L (ref 0–40)
Albumin/Globulin Ratio: 1.7 (ref 1.2–2.2)
Albumin: 4.1 g/dL (ref 3.6–4.6)
Alkaline Phosphatase: 168 IU/L — ABNORMAL HIGH (ref 39–117)
BUN/Creatinine Ratio: 16 (ref 10–24)
BUN: 23 mg/dL (ref 8–27)
Bilirubin Total: 0.3 mg/dL (ref 0.0–1.2)
CO2: 23 mmol/L (ref 20–29)
Calcium: 9.4 mg/dL (ref 8.6–10.2)
Chloride: 103 mmol/L (ref 96–106)
Creatinine, Ser: 1.47 mg/dL — ABNORMAL HIGH (ref 0.76–1.27)
GFR calc Af Amer: 49 mL/min/{1.73_m2} — ABNORMAL LOW (ref >59)
GFR calc non Af Amer: 42 mL/min/{1.73_m2} — ABNORMAL LOW (ref >59)
Globulin, Total: 2.4 g/dL (ref 1.5–4.5)
Glucose: 95 mg/dL (ref 65–99)
Potassium: 4.4 mmol/L (ref 3.5–5.2)
Sodium: 142 mmol/L (ref 134–144)
Total Protein: 6.5 g/dL (ref 6.0–8.5)

## 2019-05-28 LAB — LIPID PANEL
Chol/HDL Ratio: 2.8 ratio (ref 0.0–5.0)
Cholesterol, Total: 159 mg/dL (ref 100–199)
HDL: 57 mg/dL (ref >39)
LDL Chol Calc (NIH): 89 mg/dL (ref 0–99)
Triglycerides: 64 mg/dL (ref 0–149)
VLDL Cholesterol Cal: 13 mg/dL (ref 5–40)

## 2019-05-28 LAB — VITAMIN D 25 HYDROXY (VIT D DEFICIENCY, FRACTURES): Vit D, 25-Hydroxy: 44.6 ng/mL (ref 30.0–100.0)

## 2019-05-28 LAB — THYROID PANEL WITH TSH
Free Thyroxine Index: 1.7 (ref 1.2–4.9)
T3 Uptake Ratio: 28 % (ref 24–39)
T4, Total: 6.1 ug/dL (ref 4.5–12.0)
TSH: 2.47 u[IU]/mL (ref 0.450–4.500)

## 2019-06-08 ENCOUNTER — Other Ambulatory Visit: Payer: Self-pay

## 2019-06-08 DIAGNOSIS — R7989 Other specified abnormal findings of blood chemistry: Secondary | ICD-10-CM

## 2019-06-16 ENCOUNTER — Other Ambulatory Visit: Payer: MEDICARE

## 2019-06-16 ENCOUNTER — Other Ambulatory Visit: Payer: Self-pay

## 2019-06-16 DIAGNOSIS — E7800 Pure hypercholesterolemia, unspecified: Secondary | ICD-10-CM | POA: Diagnosis not present

## 2019-06-16 DIAGNOSIS — R7989 Other specified abnormal findings of blood chemistry: Secondary | ICD-10-CM | POA: Diagnosis not present

## 2019-06-21 LAB — CMP14+EGFR
ALT: 11 IU/L (ref 0–44)
AST: 23 IU/L (ref 0–40)
Albumin/Globulin Ratio: 1.8 (ref 1.2–2.2)
Albumin: 4.2 g/dL (ref 3.6–4.6)
Alkaline Phosphatase: 118 IU/L — ABNORMAL HIGH (ref 39–117)
BUN/Creatinine Ratio: 11 (ref 10–24)
BUN: 18 mg/dL (ref 8–27)
Bilirubin Total: 0.4 mg/dL (ref 0.0–1.2)
CO2: 25 mmol/L (ref 20–29)
Calcium: 9.3 mg/dL (ref 8.6–10.2)
Chloride: 101 mmol/L (ref 96–106)
Creatinine, Ser: 1.6 mg/dL — ABNORMAL HIGH (ref 0.76–1.27)
GFR calc Af Amer: 44 mL/min/{1.73_m2} — ABNORMAL LOW (ref >59)
GFR calc non Af Amer: 38 mL/min/{1.73_m2} — ABNORMAL LOW (ref >59)
Globulin, Total: 2.3 g/dL (ref 1.5–4.5)
Glucose: 107 mg/dL — ABNORMAL HIGH (ref 65–99)
Potassium: 4 mmol/L (ref 3.5–5.2)
Sodium: 142 mmol/L (ref 134–144)
Total Protein: 6.5 g/dL (ref 6.0–8.5)

## 2019-06-21 LAB — ALKALINE PHOSPHATASE, ISOENZYMES
BONE FRACTION: 58 % (ref 12–68)
INTESTINAL FRAC.: 0 % (ref 0–18)
LIVER FRACTION: 42 % (ref 13–88)

## 2019-06-21 LAB — GAMMA GT: GGT: 13 IU/L (ref 0–65)

## 2019-06-24 ENCOUNTER — Telehealth: Payer: Self-pay | Admitting: Family Medicine

## 2019-06-24 NOTE — Telephone Encounter (Signed)
Should be reevaluated and sent to ortho if symptoms persist

## 2019-06-24 NOTE — Telephone Encounter (Signed)
  REFERRAL REQUEST Telephone Note 06/24/2019  What type of referral do you need? Ortho  Have you been seen at our office for this problem? Yes got injection by Sharyn Lull and it right knee is not better (Advise that they may need an appointment with their PCP before a referral can be done)  Is there a particular doctor or location that you prefer? DR. Giofree  Patient notified that referrals can take up to a week or longer to process. If they haven't heard anything within a week they should call back and speak with the referral department.

## 2019-06-24 NOTE — Telephone Encounter (Signed)
°  Incoming Patient Call  06/24/2019  What symptoms do you have? Right knee pain  How long have you been sick? For a few weeks   Have you been seen for this problem? Yes he was given a shot by Sharyn Lull and it did not get any better   If your provider decides to give you a prescription, which pharmacy would you like for it to be sent to? [facility]    Patient informed that this information will be sent to the clinical staff for review and that they should receive a follow up call.

## 2019-06-24 NOTE — Telephone Encounter (Signed)
Left message to please call our office.  Need an appointment and possible referral to orthopedics.

## 2019-06-24 NOTE — Telephone Encounter (Signed)
Please advise on request for medicine for knee pain.

## 2019-06-25 ENCOUNTER — Other Ambulatory Visit: Payer: Self-pay | Admitting: Family Medicine

## 2019-06-25 DIAGNOSIS — G8929 Other chronic pain: Secondary | ICD-10-CM

## 2019-06-25 NOTE — Telephone Encounter (Signed)
Referral placed  Thanks!

## 2019-06-26 ENCOUNTER — Telehealth: Payer: Self-pay | Admitting: Family Medicine

## 2019-06-30 NOTE — Telephone Encounter (Signed)
Thanks

## 2019-06-30 NOTE — Telephone Encounter (Signed)
LM for patient

## 2019-07-10 DIAGNOSIS — M25561 Pain in right knee: Secondary | ICD-10-CM | POA: Diagnosis not present

## 2019-07-10 DIAGNOSIS — M79671 Pain in right foot: Secondary | ICD-10-CM | POA: Diagnosis not present

## 2019-07-14 ENCOUNTER — Other Ambulatory Visit: Payer: Self-pay | Admitting: Family Medicine

## 2019-08-06 ENCOUNTER — Ambulatory Visit (INDEPENDENT_AMBULATORY_CARE_PROVIDER_SITE_OTHER): Payer: MEDICARE | Admitting: Nurse Practitioner

## 2019-08-06 ENCOUNTER — Other Ambulatory Visit: Payer: Self-pay

## 2019-08-06 ENCOUNTER — Encounter: Payer: Self-pay | Admitting: Nurse Practitioner

## 2019-08-06 VITALS — BP 127/67 | HR 71 | Temp 97.1°F | Resp 20 | Ht 71.0 in | Wt 148.0 lb

## 2019-08-06 DIAGNOSIS — M25562 Pain in left knee: Secondary | ICD-10-CM | POA: Diagnosis not present

## 2019-08-06 DIAGNOSIS — G8929 Other chronic pain: Secondary | ICD-10-CM | POA: Diagnosis not present

## 2019-08-06 DIAGNOSIS — E538 Deficiency of other specified B group vitamins: Secondary | ICD-10-CM | POA: Diagnosis not present

## 2019-08-06 DIAGNOSIS — M25561 Pain in right knee: Secondary | ICD-10-CM

## 2019-08-06 DIAGNOSIS — D500 Iron deficiency anemia secondary to blood loss (chronic): Secondary | ICD-10-CM | POA: Diagnosis not present

## 2019-08-06 MED ORDER — NAPROXEN 500 MG PO TABS: 500.0000 mg | ORAL_TABLET | Freq: Two times a day (BID) | ORAL | 0 refills | Status: DC

## 2019-08-06 NOTE — Patient Instructions (Signed)

## 2019-08-06 NOTE — Assessment & Plan Note (Signed)
Knee pain not well controlled, steroid shot can not be repeated. Patient injections in both knees 2 weeks ago. Started patient on Naproxen 500 mg 2 times a day, education provided to patient to apply ice, and follow up with unresolved or worsening symptoms.  Rx sent to pharmacy

## 2019-08-06 NOTE — Assessment & Plan Note (Signed)
Patient reports fatigue and low energy, not currently taken B12, Labs collected. Results pending. Education provided

## 2019-08-06 NOTE — Progress Notes (Signed)
Established Patient Office Visit  Subjective:  Patient ID: Jon Reid, male    DOB: Mar 19, 1929  Age: 86 y.o. MRN: [id number]  CC:  Chief Complaint  Patient presents with   Knee Pain    right > left     HPI Jon Reid presents for knee pain with the right knee greater than left.  Patient reports hitting his right knee against the hospital bed while he was on admission, pain has progressively gotten worse in the last week.  Patient reports falling 3 weeks ago and has become progressively weaker every day.  Patient reports stopping vitamin B12 shots and that may have caused his increase weakness. Patient reports bulking of knees while walking, pain is a 9 out of 10 on the scale of 0-10.  Nothing has alleviated pain. patient reports getting steroid shots 3 weeks ago and that moderately helped pain.  Past Medical History:  Diagnosis Date   Arthritis    Carcinoma of prostate ([place])    prostate   CKD (chronic kidney disease) stage 2, GFR 60-89 ml/min    Coronary artery disease    a. 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   Sleep apnea    Questionable   Spinal stenosis    [facility]roke [facility])     Past Surgical History:  Procedure Laterality Date   CARDIAC CATHETERIZATION  11/07/2012   Dr Acie Fredrickson   CATARACT EXTRACTION W/ INTRAOCULAR LENS IMPLANT Bilateral    CORONARY ARTERY BYPASS GRAFT N/A 11/11/2012   Procedure: CORONARY ARTERY BYPASS GRAFTING times four using Right Greater Saphenous Vein Graft harvested endoscopically and Left Internal  Mammary Artery.;  Surgeon: Ivin Poot, MD;  Location: [place];  Service: Open Heart Surgery;  Laterality: N/A;   INGUINAL HERNIA REPAIR Right 01/10/2017   Procedure: OPEN RIGHT HERNIA REPAIR INGUINAL;  Surgeon: Ileana Roup, MD;  Location: [facility];  Service: General;  Laterality: Right;   INSERTION OF MESH Right 01/10/2017   Procedure: INSERTION OF MESH;  Surgeon: Ileana Roup, MD;  Location: [facility];  Service: General;  Laterality: Right;   INTRAOPERATIVE TRANSESOPHAGEAL ECHOCARDIOGRAM N/A 11/11/2012   Procedure: INTRAOPERATIVE TRANSESOPHAGEAL ECHOCARDIOGRAM;  Surgeon: Ivin Poot, MD;  Location: [place];  Service: Open Heart Surgery;  Laterality: N/A;   LEFT HEART CATHETERIZATION WITH CORONARY ANGIOGRAM N/A 11/07/2012   Procedure: LEFT HEART CATHETERIZATION WITH CORONARY ANGIOGRAM;  Surgeon: Thayer Headings, MD;  [facility] C[facility];  Service: Cardiovascular;  Laterality: N/A;   LUMBAR LAMINECTOMY/DECOMPRESSION MICRODISCECTOMY N/A 02/05/2019   Procedure: LUMBAR LAMINECTOMY/DECOMPRESSION L3-L4;  Surgeon: Latanya Maudlin, MD;  Location: [facility];  Service: Orthopedics;  Laterality: N/A;  11min   LYMPH NODE DISSECTION     Bilateral pelvic   RETROPUBIC PROSTATECTOMY     Radical    Family History  Problem Relation Age of Onset   Aneurysm Father        Cerebral   Stroke Father    Hypertension Father    Cancer  Mother        colon   Parkinson's disease Son     Social History   Socioeconomic History   Marital status: Married    Spouse name: [name]   Number of children: 3   Years of education: 12   Highest education level: High school graduate  Occupational History   Occupation: Retired  Tobacco Use   Smoking status: Never Smoker   Smokeless tobacco: Never Used  Substance and Sexual Activity   Alcohol use: Yes    Comment: Rare   Drug use: No   Sexual activity: Not Currently  Other Topics Concern   Not on file  Social History Narrative    Married with 3 children   Denies caffeine use    Social Determinants of Radio broadcast assistant Strain: Low Risk    Difficulty of Paying Living Expenses: Not hard at all  Food Insecurity: No Food Insecurity   Worried About Charity fundraiser in the Last Year: Never true   Arboriculturist in the Last Year: Never true  Transportation Needs: No Transportation Needs   Lack of Transportation (Medical): No   Lack of Transportation (Non-Medical): No  Physical Activity: Sufficiently Active   Days of Exercise per Week: 7 days   Minutes of Exercise per Session: 30 min  Stress: No Stress Concern Present   Feeling of Stress : Not at all  Social Connections: Somewhat Isolated   Frequency of Communication with Friends and Family: More than three times a week   Frequency of Social Gatherings with Friends and Family: More than three times a week   Attends Religious Services: Never   Marine scientist or Organizations: No   Attends Music therapist: Never   Marital Status: Married  Human resources officer Violence: Not At Risk   Fear of Current or Ex-Partner: No   Emotionally Abused: No   Physically Abused: No   Sexually Abused: No    Outpatient Medications Prior to Visit  Medication Sig Dispense Refill   aspirin EC 81 MG tablet Take 81 mg by mouth daily.     Cholecalciferol (VITAMIN D) 2000 units CAPS Take 2,000 Units by mouth daily.     diclofenac sodium (VOLTAREN) 1 % GEL Apply 2 g topically 4 (four) times daily. 150 g 3   fluticasone (FLONASE) 50 MCG/ACT nasal spray One to 2 sprays each nostril at bedtime 16 g 6   furosemide (LASIX) 40 MG tablet TAKE 1 TABLET DAILY 90 tablet 0   Iron, Ferrous Sulfate, 325 (65 Fe) MG TABS Take 1 tablet by mouth every Monday, Wednesday, and Friday. 30 tablet 6   pantoprazole (PROTONIX) 40 MG tablet TAKE 1 TABLET ONCE DAILY FOR REFLUX 30 tablet 0   triamcinolone cream (KENALOG) 0.1 % Apply 1 application topically 2  (two) times daily. (Patient taking differently: Apply 1 application topically daily as needed (itching). ) 30 g 3   nitroGLYCERIN (NITROSTAT) 0.4 MG SL tablet Place 1 tablet (0.4 mg total) under the tongue every 5 (five) minutes as needed for chest pain. (Patient not taking: Reported on 08/06/2019) 25 tablet PRN   methocarbamol (ROBAXIN) 500 MG tablet Take 1 tablet (500 mg total) by mouth every 6 (six) hours as needed for muscle spasms. 40 tablet 0   cyanocobalamin ((VITAMIN B-12)) injection 1,000 mcg      No facility-administered medications prior to visit.    Allergies  Allergen Reactions   Crestor [Rosuvastatin]  GYNECOMASTIA AND SWELLING    ROS Review of Systems  Constitutional: Positive for activity change, appetite change and fatigue.  HENT: Negative.   Respiratory: Negative for chest tightness and shortness of breath.   Cardiovascular: Negative.   Gastrointestinal: Negative for constipation, diarrhea, nausea and vomiting.  Neurological: Negative for light-headedness.  Psychiatric/Behavioral: Negative for sleep disturbance. The patient is not nervous/anxious.       Objective:    Physical Exam  Constitutional: He is oriented to person, place, and time. He appears well-developed and well-nourished.  HENT:  Head: Normocephalic.  Mouth/Throat: Oropharynx is clear and moist.  Eyes: Conjunctivae are normal.  Cardiovascular: Normal rate and regular rhythm.  Pulmonary/Chest: Breath sounds normal.  Abdominal: Bowel sounds are normal.  Musculoskeletal:        General: Tenderness present.     Cervical back: Neck supple.     Comments: Bilateral Knee pain  Neurological: He is oriented to person, place, and time.  Skin: Skin is warm. No rash noted. No erythema.  Psychiatric: He has a normal mood and affect. His behavior is normal.     BP 127/67    Pulse 71    Temp (!) 97.1 F (36.2 C)    Resp 20    Ht 5\' 11"  (1.803 m)    Wt 148 lb (67.1 kg)    SpO2 98%    BMI 20.64 kg/m    Wt Readings from Last 3 Encounters:  08/06/19 148 lb (67.1 kg)  05/27/19 156 lb (70.8 kg)  05/27/19 156 lb (70.8 kg)       Lab Results  Component Value Date   TSH 2.470 05/27/2019   Lab Results  Component Value Date   WBC 6.1 05/27/2019   HGB 11.2 (L) 05/27/2019   HCT 33.5 (L) 05/27/2019   MCV 95 05/27/2019   PLT 259 05/27/2019   Lab Results  Component Value Date   NA 142 06/16/2019   K 4.0 06/16/2019   CO2 25 06/16/2019   GLUCOSE 107 (H) 06/16/2019   BUN 18 06/16/2019   CREATININE 1.60 (H) 06/16/2019   BILITOT 0.4 06/16/2019   ALKPHOS 118 (H) 06/16/2019   AST 23 06/16/2019   ALT 11 06/16/2019   PROT 6.5 06/16/2019   ALBUMIN 4.2 06/16/2019   CALCIUM 9.3 06/16/2019   ANIONGAP 9 02/10/2019   GFR 59.83 (L) 12/10/2012   Lab Results  Component Value Date   CHOL 159 05/27/2019   Lab Results  Component Value Date   HDL 57 05/27/2019   Lab Results  Component Value Date   LDLCALC 89 05/27/2019   Lab Results  Component Value Date   TRIG 64 05/27/2019   Lab Results  Component Value Date   CHOLHDL 2.8 05/27/2019   Lab Results  Component Value Date   HGBA1C 5.5% 05/31/2014      Assessment & Plan:  Vitamin B12 deficiency Patient reports fatigue and low energy, not currently taken B12, Labs collected. Results pending. Education provided  Chronic pain of both knees Knee pain not well controlled, steroid shot can not be repeated. Patient injections in both knees 2 weeks ago. Started patient on Naproxen 500 mg 2 times a day, education provided to patient to apply ice, and follow up with unresolved or worsening symptoms.  Rx sent to pharmacy  Problem List Items Addressed This Visit      Other   Vitamin B12 deficiency   Relevant Orders   Anemia Profile B   Iron deficiency anemia due to  chronic blood loss - Primary   Relevant Orders   Anemia Profile B        Follow-up: Return if symptoms worsen or fail to improve.    Ivy Lynn, NP

## 2019-08-07 LAB — ANEMIA PROFILE B
Basophils Absolute: 0.1 10*3/uL (ref 0.0–0.2)
Basos: 1 %
EOS (ABSOLUTE): 0.1 10*3/uL (ref 0.0–0.4)
Eos: 2 %
Ferritin: 54 ng/mL (ref 30–400)
Folate: 13.3 ng/mL (ref >3.0)
Hematocrit: 32.9 % — ABNORMAL LOW (ref 37.5–51.0)
Hemoglobin: 10.3 g/dL — ABNORMAL LOW (ref 13.0–17.7)
Immature Grans (Abs): 0 10*3/uL (ref 0.0–0.1)
Immature Granulocytes: 0 %
Iron Saturation: 14 % — ABNORMAL LOW (ref 15–55)
Iron: 42 ug/dL (ref 38–169)
Lymphocytes Absolute: 2.3 10*3/uL (ref 0.7–3.1)
Lymphs: 35 %
MCH: 29.4 pg (ref 26.6–33.0)
MCHC: 31.3 g/dL — ABNORMAL LOW (ref 31.5–35.7)
MCV: 94 fL (ref 79–97)
Monocytes Absolute: 0.5 10*3/uL (ref 0.1–0.9)
Monocytes: 8 %
Neutrophils Absolute: 3.5 10*3/uL (ref 1.4–7.0)
Neutrophils: 54 %
Platelets: 299 10*3/uL (ref 150–450)
RBC: 3.5 x10E6/uL — ABNORMAL LOW (ref 4.14–5.80)
RDW: 15.2 % (ref 11.6–15.4)
Retic Ct Pct: 1.2 % (ref 0.6–2.6)
Total Iron Binding Capacity: 302 ug/dL (ref 250–450)
UIBC: 260 ug/dL (ref 111–343)
Vitamin B-12: 445 pg/mL (ref 232–1245)
WBC: 6.5 10*3/uL (ref 3.4–10.8)

## 2019-08-12 ENCOUNTER — Other Ambulatory Visit: Payer: Self-pay | Admitting: Family Medicine

## 2019-08-18 ENCOUNTER — Telehealth: Payer: Self-pay | Admitting: Family Medicine

## 2019-08-18 ENCOUNTER — Other Ambulatory Visit: Payer: Self-pay

## 2019-08-18 MED ORDER — FUROSEMIDE 40 MG PO TABS: 40.0000 mg | ORAL_TABLET | Freq: Every day | ORAL | 0 refills | Status: DC

## 2019-08-18 NOTE — Telephone Encounter (Signed)
Pt. Needs to be seen for this. Thanks, WS 

## 2019-08-18 NOTE — Telephone Encounter (Signed)
°  REFERRAL REQUEST Telephone Note 08/18/2019  What type of referral do you need? Ortho  Have you been seen at our office for this problem? Yes, knee pain (Advise that they may need an appointment with their PCP before a referral can be done)  Is there a particular doctor or location that you prefer? No just a good one,   Patient notified that referrals can take up to a week or longer to process. If they haven't heard anything within a week they should call back and speak with the referral department.

## 2019-08-19 NOTE — Telephone Encounter (Signed)
His son will relay message to call [facility] at [phone] to schedule an appointment.  He has been seen by them before.

## 2019-08-24 ENCOUNTER — Ambulatory Visit (INDEPENDENT_AMBULATORY_CARE_PROVIDER_SITE_OTHER): Payer: MEDICARE | Admitting: Family Medicine

## 2019-08-24 ENCOUNTER — Encounter: Payer: Self-pay | Admitting: Family Medicine

## 2019-08-24 DIAGNOSIS — G8929 Other chronic pain: Secondary | ICD-10-CM

## 2019-08-24 DIAGNOSIS — I2510 Atherosclerotic heart disease of native coronary artery without angina pectoris: Secondary | ICD-10-CM | POA: Diagnosis not present

## 2019-08-24 DIAGNOSIS — M25561 Pain in right knee: Secondary | ICD-10-CM | POA: Diagnosis not present

## 2019-08-24 MED ORDER — PREDNISONE 20 MG PO TABS: ORAL_TABLET | ORAL | 0 refills | Status: DC

## 2019-08-24 NOTE — Progress Notes (Signed)
Virtual Visit via telephone Note  I connected with Jon Reid on 08/24/19 at [phone] by telephone and verified that I am speaking with the correct person using two identifiers. Jon Reid is currently located at home and no other people are currently with her during visit. The provider, Fransisca Kaufmann Verna Hamon, MD is located in their office at time of visit.  Call ended at [phone]  I discussed the limitations, risks, security and privacy concerns of performing an evaluation and management service by telephone and the availability of in person appointments. I also discussed with the patient that there may be a patient responsible charge related to this service. The patient expressed understanding and agreed to proceed.   History and Present Illness: Patient is calling in for right knee pain up to hip pain.  This has been going on since November since he was in hospital in November. He had a back operation with Dr Gladstone Lighter and knee injection on November 18th.  He had another Xray with him after. He has mostly pain in the knee cap and sometimes the muscle in the leg.  At first it was on top of the foot up to knee.  He says his foot was caught and twisted under the railing of the bed in November and hurting since then. He had a knee injection at the time. The injection in the right knee and it did not help. He denies numbness or weakness. Knee hurts like a sprain.    No diagnosis found.  Outpatient Encounter Medications as of 08/24/2019  Medication Sig  . aspirin EC 81 MG tablet Take 81 mg by mouth daily.  . Cholecalciferol (VITAMIN D) 2000 units CAPS Take 2,000 Units by mouth daily.  . diclofenac sodium (VOLTAREN) 1 % GEL Apply 2 g topically 4 (four) times daily.  . fluticasone (FLONASE) 50 MCG/ACT nasal spray One to 2 sprays each nostril at bedtime  . furosemide (LASIX) 40 MG tablet Take 1 tablet (40 mg total) by mouth daily.  . Iron, Ferrous Sulfate, 325 (65 Fe) MG TABS Take 1 tablet by mouth every  Monday, Wednesday, and Friday.  . naproxen (NAPROSYN) 500 MG tablet Take 1 tablet (500 mg total) by mouth 2 (two) times daily with a meal.  . nitroGLYCERIN (NITROSTAT) 0.4 MG SL tablet Place 1 tablet (0.4 mg total) under the tongue every 5 (five) minutes as needed for chest pain. (Patient not taking: Reported on 08/06/2019)  . pantoprazole (PROTONIX) 40 MG tablet TAKE 1 TABLET ONCE DAILY FOR REFLUX  . triamcinolone cream (KENALOG) 0.1 % Apply 1 application topically 2 (two) times daily. (Patient taking differently: Apply 1 application topically daily as needed (itching). )   No facility-administered encounter medications on file as of 08/24/2019.    Review of Systems  Constitutional: Negative for chills and fever.  Respiratory: Negative for shortness of breath and wheezing.   Cardiovascular: Negative for chest pain and leg swelling.  Musculoskeletal: Positive for arthralgias. Negative for back pain and gait problem.  Skin: Negative for rash.  Neurological: Negative for dizziness, weakness and light-headedness.  All other systems reviewed and are negative.   Observations/Objective: Patient sounds comfortable and in no acute distress  Assessment and Plan: Problem List Items Addressed This Visit    None    Visit Diagnoses    Chronic pain of right knee    -  Primary   Relevant Medications   predniSONE (DELTASONE) 20 MG tablet   Other Relevant Orders  MR Knee Right Wo Contrast     will try for knee mri, may be related to back issues.  Follow up plan: Return if symptoms worsen or fail to improve.     I discussed the assessment and treatment plan with the patient. The patient was provided an opportunity to ask questions and all were answered. The patient agreed with the plan and demonstrated an understanding of the instructions.   The patient was advised to call back or seek an in-person evaluation if the symptoms worsen or if the condition fails to improve as anticipated.  The  above assessment and management plan was discussed with the patient. The patient verbalized understanding of and has agreed to the management plan. Patient is aware to call the clinic if symptoms persist or worsen. Patient is aware when to return to the clinic for a follow-up visit. Patient educated on when it is appropriate to go to the emergency department.    I provided 14 minutes of non-face-to-face time during this encounter.    Worthy Rancher, MD

## 2019-08-25 ENCOUNTER — Telehealth: Payer: Self-pay | Admitting: Cardiology

## 2019-08-25 NOTE — Telephone Encounter (Signed)
Spoke with pt and had several complaints has noted loss of control of right hand (ex trying to write name was unable to perform task) and shaking for 2-3 weeks and  also memory issues and continues to have unsteady gait Per pt also has lost 35 lb in 3 months with no change in dietary intake.Will forward to Dr Percival Spanish for review and recommendations./cy

## 2019-08-25 NOTE — Telephone Encounter (Signed)
If he is having acute neurologic complaints he needs to present to the emergency room.  Otherwise I would start with his primary provider given the description of his symptoms.

## 2019-08-25 NOTE — Telephone Encounter (Signed)
New Message:     Pt says he have problem with using his right hand,he can not use it all times. This started about 2 weeks ago. He was wondering if he might had a stroke.

## 2019-08-26 NOTE — Telephone Encounter (Signed)
Spoke with patient. Informed patient of Dr. Hochrein's recommendations. Patient verbalized understanding.  

## 2019-09-10 ENCOUNTER — Other Ambulatory Visit: Payer: Self-pay

## 2019-09-10 ENCOUNTER — Ambulatory Visit (HOSPITAL_COMMUNITY)
Admission: RE | Admit: 2019-09-10 | Discharge: 2019-09-10 | Disposition: A | Discharge status: Home / Self Care | Payer: MEDICARE | Source: Ambulatory Visit | Attending: Family Medicine | Admitting: Family Medicine

## 2019-09-10 DIAGNOSIS — G8929 Other chronic pain: Secondary | ICD-10-CM | POA: Diagnosis not present

## 2019-09-10 DIAGNOSIS — M25561 Pain in right knee: Secondary | ICD-10-CM | POA: Diagnosis not present

## 2019-09-16 ENCOUNTER — Telehealth: Payer: Self-pay | Admitting: Family Medicine

## 2019-09-16 NOTE — Telephone Encounter (Signed)
Patient aware of results.

## 2019-09-17 ENCOUNTER — Other Ambulatory Visit: Payer: Self-pay | Admitting: Family Medicine

## 2019-09-17 DIAGNOSIS — S83206D Unspecified tear of unspecified meniscus, current injury, right knee, subsequent encounter: Secondary | ICD-10-CM

## 2019-09-17 NOTE — Progress Notes (Signed)
Placed referral for the patient to [facility] for his right knee

## 2019-09-25 ENCOUNTER — Other Ambulatory Visit: Payer: Self-pay

## 2019-09-25 ENCOUNTER — Ambulatory Visit (INDEPENDENT_AMBULATORY_CARE_PROVIDER_SITE_OTHER): Payer: MEDICARE | Admitting: Family Medicine

## 2019-09-25 ENCOUNTER — Encounter: Payer: Self-pay | Admitting: Family Medicine

## 2019-09-25 VITALS — BP 135/72 | HR 70 | Temp 97.4°F | Ht 71.0 in | Wt 150.0 lb

## 2019-09-25 DIAGNOSIS — G969 Disorder of central nervous system, unspecified: Secondary | ICD-10-CM

## 2019-09-25 DIAGNOSIS — R251 Tremor, unspecified: Secondary | ICD-10-CM

## 2019-09-25 DIAGNOSIS — I2510 Atherosclerotic heart disease of native coronary artery without angina pectoris: Secondary | ICD-10-CM

## 2019-09-25 NOTE — Progress Notes (Signed)
Subjective:  Patient ID: Jon Reid, male    DOB: 02/20/1929  Age: 86 y.o. MRN: [id number]  CC: Shaking in right hand (patient reports stroke while having back surgery in October (he thinks))   HPI Jon Reid presents for inability to control shaking in his right hand.  He had surgery about 9 months ago and apparently had a stroke during the surgery.  He has had shaking in the right hand ever since.  It is isolated to the right hand and forearm.  It is not present throughout the rest of the body.  It makes it next impossible for him to grip or hold a utensil or pen or pencil etc.  Depression screen [facility] 2/9 09/25/2019 08/06/2019 08/06/2019  Decreased Interest 1 1 0  Down, Depressed, Hopeless 0 0 0  PHQ - 2 Score 1 1 0  Altered sleeping - - -  Tired, decreased energy - - -  Change in appetite - - -  Feeling bad or failure about yourself  - - -  Trouble concentrating - - -  Moving slowly or fidgety/restless - - -  Suicidal thoughts - - -  PHQ-9 Score - - -  Some recent data might be hidden    History Jon Reid has a past medical history of Arthritis, Carcinoma of prostate ([place]), CKD (chronic kidney disease) stage 2, GFR 60-89 ml/min, Coronary artery disease, First degree AV block (01/28/2019), GERD (gastroesophageal reflux disease), Gout, HTN (hypertension), echocardiogram, Moderate aortic stenosis (12/06/2017), Right BBB/left ant fasc block (01/28/2019), S/P CABG x 4 (02/02/2013), Sleep apnea, Spinal stenosis, and Stroke ([place]).   He has a past surgical history that includes Retropubic prostatectomy; Lymph node dissection; Cardiac catheterization (11/07/2012); Coronary artery bypass graft (N/A, 11/11/2012); Intraoprative transesophageal echocardiogram (N/A, 11/11/2012); left heart catheterization with coronary angiogram (N/A, 11/07/2012); Inguinal hernia repair (Right, 01/10/2017); Insertion of mesh (Right, 01/10/2017); Cataract extraction w/ intraocular lens implant (Bilateral); and Lumbar  laminectomy/decompression microdiscectomy (N/A, 02/05/2019).   His family history includes Aneurysm in his father; Cancer in his mother; Hypertension in his father; Parkinson's disease in his son; Stroke in his father.He reports that he has never smoked. He has never used smokeless tobacco. He reports current alcohol use. He reports that he does not use drugs.    ROS Review of Systems  Constitutional: Negative for fever.  Respiratory: Negative for shortness of breath.   Cardiovascular: Negative for chest pain.  Musculoskeletal: Negative for arthralgias.  Skin: Negative for rash.  Neurological: Positive for tremors.    Objective:  BP 135/72   Pulse 70   Temp (!) 97.4 F (36.3 C) (Temporal)   Ht 5\' 11"  (1.803 m)   Wt 150 lb (68 kg)   BMI 20.92 kg/m   BP Readings from Last 3 Encounters:  09/25/19 135/72  08/06/19 127/67  05/27/19 138/72    Wt Readings from Last 3 Encounters:  09/25/19 150 lb (68 kg)  08/06/19 148 lb (67.1 kg)  05/27/19 156 lb (70.8 kg)     Physical Exam Vitals reviewed.  Constitutional:      Appearance: He is well-developed.  HENT:     Head: Normocephalic and atraumatic.     Right Ear: External ear normal.     Left Ear: External ear normal.     Mouth/Throat:     Pharynx: No oropharyngeal exudate or posterior oropharyngeal erythema.  Eyes:     Pupils: Pupils are equal, round, and reactive to light.  Cardiovascular:     Rate and  Rhythm: Normal rate and regular rhythm.     Heart sounds: No murmur heard.   Pulmonary:     Effort: No respiratory distress.     Breath sounds: Normal breath sounds.  Musculoskeletal:     Cervical back: Normal range of motion and neck supple.  Neurological:     Mental Status: He is alert and oriented to person, place, and time.     Coordination: Coordination abnormal (marked tremor right hand).       Assessment & Plan:   Jon Reid was seen today for shaking in right hand.  Diagnoses and all orders for this  visit:  Tremor due to disorder of central nervous system -     Ambulatory referral to Neurology       I have discontinued Jon Reid's predniSONE. I am also having him maintain his fluticasone, nitroGLYCERIN, aspirin EC, Vitamin D, triamcinolone cream, diclofenac sodium, Iron (Ferrous Sulfate), naproxen, pantoprazole, and furosemide.  Allergies as of 09/25/2019      Reactions   Crestor [rosuvastatin]    GYNECOMASTIA AND SWELLING      Medication List       Accurate as of September 25, 2019 11:59 PM. If you have any questions, ask your nurse or doctor.        STOP taking these medications   predniSONE 20 MG tablet Commonly known as: DELTASONE Stopped by: Claretta Fraise, MD     TAKE these medications   aspirin EC 81 MG tablet Take 81 mg by mouth daily.   diclofenac sodium 1 % Gel Commonly known as: VOLTAREN Apply 2 g topically 4 (four) times daily.   fluticasone 50 MCG/ACT nasal spray Commonly known as: FLONASE One to 2 sprays each nostril at bedtime   furosemide 40 MG tablet Commonly known as: LASIX Take 1 tablet (40 mg total) by mouth daily.   Iron (Ferrous Sulfate) 325 (65 Fe) MG Tabs Take 1 tablet by mouth every Monday, Wednesday, and Friday.   naproxen 500 MG tablet Commonly known as: Naprosyn Take 1 tablet (500 mg total) by mouth 2 (two) times daily with a meal.   nitroGLYCERIN 0.4 MG SL tablet Commonly known as: NITROSTAT Place 1 tablet (0.4 mg total) under the tongue every 5 (five) minutes as needed for chest pain.   pantoprazole 40 MG tablet Commonly known as: PROTONIX TAKE 1 TABLET ONCE DAILY FOR REFLUX   triamcinolone cream 0.1 % Commonly known as: KENALOG Apply 1 application topically 2 (two) times daily. What changed:   when to take this  reasons to take this   Vitamin D 50 MCG (2000 UT) Caps Take 2,000 Units by mouth daily.        Follow-up: Return in about 6 months (around 03/27/2020), or if symptoms worsen or fail to  improve.  Claretta Fraise, M.D.

## 2019-09-27 ENCOUNTER — Encounter: Payer: Self-pay | Admitting: Family Medicine

## 2019-09-29 ENCOUNTER — Other Ambulatory Visit: Payer: Self-pay | Admitting: Family Medicine

## 2019-09-29 ENCOUNTER — Telehealth: Payer: Self-pay | Admitting: Family Medicine

## 2019-09-29 DIAGNOSIS — D500 Iron deficiency anemia secondary to blood loss (chronic): Secondary | ICD-10-CM

## 2019-09-29 MED ORDER — IRON (FERROUS SULFATE) 325 (65 FE) MG PO TABS: 1.0000 | ORAL_TABLET | ORAL | 6 refills | Status: DC

## 2019-09-29 MED ORDER — PANTOPRAZOLE SODIUM 40 MG PO TBEC: DELAYED_RELEASE_TABLET | ORAL | 1 refills | Status: DC

## 2019-09-29 MED ORDER — FUROSEMIDE 40 MG PO TABS: 40.0000 mg | ORAL_TABLET | Freq: Every day | ORAL | 0 refills | Status: DC

## 2019-09-29 MED ORDER — DOXYCYCLINE HYCLATE 100 MG PO CAPS: 100.0000 mg | ORAL_CAPSULE | Freq: Two times a day (BID) | ORAL | 0 refills | Status: DC

## 2019-09-29 NOTE — Telephone Encounter (Signed)
Done. Thanks, WS 

## 2019-09-29 NOTE — Telephone Encounter (Signed)
Pt says that his medications still have not been sent in to [facility]. He was going to send in antibiotic. He was here last Friday.

## 2019-09-29 NOTE — Telephone Encounter (Signed)
Pt aware rx sent to pharmacy.

## 2019-10-06 ENCOUNTER — Other Ambulatory Visit: Payer: Self-pay

## 2019-10-06 ENCOUNTER — Ambulatory Visit (INDEPENDENT_AMBULATORY_CARE_PROVIDER_SITE_OTHER): Payer: MEDICARE | Admitting: Family Medicine

## 2019-10-06 ENCOUNTER — Encounter: Payer: Self-pay | Admitting: Family Medicine

## 2019-10-06 VITALS — BP 138/70 | HR 67 | Temp 97.3°F | Resp 20 | Ht 71.0 in | Wt 147.4 lb

## 2019-10-06 DIAGNOSIS — I350 Nonrheumatic aortic (valve) stenosis: Secondary | ICD-10-CM | POA: Diagnosis not present

## 2019-10-06 DIAGNOSIS — R634 Abnormal weight loss: Secondary | ICD-10-CM | POA: Diagnosis not present

## 2019-10-06 DIAGNOSIS — I340 Nonrheumatic mitral (valve) insufficiency: Secondary | ICD-10-CM

## 2019-10-06 DIAGNOSIS — N1832 Chronic kidney disease, stage 3b: Secondary | ICD-10-CM

## 2019-10-06 DIAGNOSIS — G969 Disorder of central nervous system, unspecified: Secondary | ICD-10-CM | POA: Diagnosis not present

## 2019-10-06 DIAGNOSIS — I739 Peripheral vascular disease, unspecified: Secondary | ICD-10-CM | POA: Diagnosis not present

## 2019-10-06 DIAGNOSIS — R251 Tremor, unspecified: Secondary | ICD-10-CM

## 2019-10-06 DIAGNOSIS — I700 Atherosclerosis of aorta: Secondary | ICD-10-CM | POA: Diagnosis not present

## 2019-10-06 DIAGNOSIS — R29818 Other symptoms and signs involving the nervous system: Secondary | ICD-10-CM

## 2019-10-06 DIAGNOSIS — E538 Deficiency of other specified B group vitamins: Secondary | ICD-10-CM

## 2019-10-06 DIAGNOSIS — I10 Essential (primary) hypertension: Secondary | ICD-10-CM

## 2019-10-06 DIAGNOSIS — E559 Vitamin D deficiency, unspecified: Secondary | ICD-10-CM | POA: Diagnosis not present

## 2019-10-06 DIAGNOSIS — I2510 Atherosclerotic heart disease of native coronary artery without angina pectoris: Secondary | ICD-10-CM

## 2019-10-06 DIAGNOSIS — E7800 Pure hypercholesterolemia, unspecified: Secondary | ICD-10-CM

## 2019-10-06 NOTE — Progress Notes (Signed)
Subjective:  Patient ID: Jon Reid, male    DOB: 1928/12/06  Age: 86 y.o. MRN: [id number]  CC: Medical Management of Chronic Issues   HPI Jon Reid presents for concern that his weight has dropped over the last year from 180 pounds to 144 pounds.  This is in spite of eating a regular diet.  He has not come back in any way.  He continues to have the right upper extremity tremor.  This interferes with him writing and doing fine movements.  Patient denies any chest pain or shortness of breath he has not had any edema.  Of note is that he does have valvular disease of both mitral and aortic valves.  He is also been noted to have abdominal aortic atherosclerosis   Follow-up of hypertension. Patient has no history of headache chest pain or shortness of breath or recent cough.  Patient has history of vitamin D and vitamin B-12 deficiencies those will be checked today.    Patient also denies symptoms of TIA such as numbness weakness lateralizing. Patient checks  blood pressure at home and has not had any elevated readings recently. Patient denies side effects from his medication. States taking it regularly.   Depression screen [facility] 2/9 10/06/2019 09/25/2019 08/06/2019  Decreased Interest _0 Down, Depressed, Hopeless 0 0 0  PHQ - 2 Score _1 Altered sleeping - - -  Tired, decreased energy - - -  Change in appetite - - -  Feeling bad or failure about yourself  - - -  Trouble concentrating - - -  Moving slowly or fidgety/restless - - -  Suicidal thoughts - - -  PHQ-9 Score - - -  Some recent data might be hidden    History Jon Reid has a past medical history of Arthritis, Carcinoma of prostate ([place]), CKD (chronic kidney disease) stage 2, GFR 60-89 ml/min, Coronary artery disease, First degree AV block (01/28/2019), GERD (gastroesophageal reflux disease), Gout, HTN (hypertension), echocardiogram, Macrocytosis without anemia (01/08/2012), Moderate aortic stenosis (12/06/2017), 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   He has a past surgical history that includes Retropubic prostatectomy; Lymph node dissection; Cardiac catheterization (11/07/2012); Coronary artery bypass graft (N/A, 11/11/2012); Intraoprative transesophageal echocardiogram (N/A, 11/11/2012); left heart catheterization with coronary angiogram (N/A, 11/07/2012); Inguinal hernia repair (Right, 01/10/2017); Insertion of mesh (Right, 01/10/2017); Cataract extraction w/ intraocular lens implant (Bilateral); and Lumbar laminectomy/decompression microdiscectomy (N/A, 02/05/2019).   His family history includes Aneurysm in his father; Cancer in his mother; Hypertension in his father; Parkinson's disease in his son; Stroke in his father.He reports that he has never smoked. He has never used smokeless tobacco. He reports current alcohol use. He reports that he does not use drugs.    ROS Review of Systems  Constitutional: Negative.   HENT: Negative.   Eyes: Negative for visual disturbance.  Respiratory: Negative for cough and shortness of breath.   Cardiovascular: Negative for chest pain and leg swelling.  Gastrointestinal: Negative for abdominal pain, diarrhea, nausea and vomiting.  Genitourinary: Negative for difficulty urinating.  Musculoskeletal: Negative for arthralgias and myalgias.  Skin: Negative for rash.  Neurological: Positive for tremors (In the right hand and forearm with associated numbness intermittently.) and numbness. Negative for headaches.  Psychiatric/Behavioral: Negative for sleep disturbance.       Patient reports inability to control his emotions at times he just breaks out crying for no reason.  This is been ongoing since his surgery last fall during which there was suspicion for a stroke.    Objective:  BP 138/70   Pulse 67   Temp (!) 97.3 F (36.3 C) (Temporal)   Resp 20   Ht _0  (1.803 m)   Wt 147 lb 6 oz  (66.8 kg)   SpO2 98%   BMI 20.55 kg/m   BP Readings from Last 3 Encounters:  10/06/19 138/70  09/25/19 135/72  08/06/19 127/67    Wt Readings from Last 3 Encounters:  10/06/19 147 lb 6 oz (66.8 kg)  09/25/19 150 lb (68 kg)  08/06/19 148 lb (67.1 kg)     Physical Exam Vitals reviewed.  Constitutional:      Appearance: He is well-developed.  HENT:     Head: Normocephalic and atraumatic.     Right Ear: Tympanic membrane and external ear normal. No decreased hearing noted.     Left Ear: Tympanic membrane and external ear normal. No decreased hearing noted.     Mouth/Throat:     Pharynx: No oropharyngeal exudate or posterior oropharyngeal erythema.  Eyes:     Pupils: Pupils are equal, round, and reactive to light.  Cardiovascular:     Rate and Rhythm: Normal rate and regular rhythm.     Heart sounds: Murmur (6-2/6 holosystolic, all auscultation points) heard.   Pulmonary:     Effort: No respiratory distress.     Breath sounds: Normal breath sounds.  Abdominal:     General: Bowel sounds are normal.     Palpations: Abdomen is soft. There is no mass.     Tenderness: There is no abdominal tenderness.  Musculoskeletal:     Cervical back: Normal range of motion and neck supple.       Assessment & Plan:   Jon Reid was seen today for medical management of chronic issues.  Diagnoses and all orders for this visit:  Benign essential hypertension -     CBC with Differential/Platelet -     CMP14+EGFR  Chronic renal impairment, stage 3b -     CBC with Differential/Platelet -     CMP14+EGFR  Peripheral vascular disease (HCC) -     CBC with Differential/Platelet -     CMP14+EGFR  Vitamin B12 deficiency -     CBC with Differential/Platelet -     CMP14+EGFR -     Vitamin B12  Pure hypercholesterolemia -     CBC with Differential/Platelet -     CMP14+EGFR  Abdominal aortic atherosclerosis (HCC) -     CBC with Differential/Platelet -     CMP14+EGFR -     CT ABDOMEN  PELVIS W CONTRAST; Future  Vitamin D deficiency -     CBC with Differential/Platelet -     CMP14+EGFR -     VITAMIN D 25 Hydroxy (Vit-D Deficiency, Fractures)  Nonrheumatic aortic valve stenosis -     CBC with Differential/Platelet -     CMP14+EGFR  Nonrheumatic mitral valve regurgitation -     CBC with Differential/Platelet -     CMP14+EGFR  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  Allergies as of 10/06/2019      Reactions   Crestor [rosuvastatin]    GYNECOMASTIA AND SWELLING      Medication List       Accurate as of October 06, 2019  6:51 PM. If you have any questions, ask your nurse or doctor.        aspirin EC 81 MG tablet Take 81 mg by mouth daily.   diclofenac sodium 1 % Gel Commonly known as: VOLTAREN Apply 2 g topically 4 (four) times daily.   doxycycline 100 MG capsule Commonly known as: Vibramycin Take 1 capsule (100 mg total) by mouth 2 (two) times daily.   fluticasone 50 MCG/ACT nasal spray Commonly known as: FLONASE One to 2 sprays each nostril at bedtime   furosemide 40 MG tablet Commonly known as: LASIX Take 1 tablet (40 mg total) by mouth daily.   Iron (Ferrous Sulfate) 325 (65 Fe) MG Tabs Take 1 tablet by mouth every Monday, Wednesday, and Friday.   naproxen 500 MG tablet Commonly known as: Naprosyn Take 1 tablet (500 mg total) by mouth 2 (two) times daily with a meal.   nitroGLYCERIN 0.4 MG SL tablet Commonly known as: NITROSTAT Place 1 tablet (0.4 mg total) under the tongue  every 5 (five) minutes as needed for chest pain.   pantoprazole 40 MG tablet Commonly known as: PROTONIX TAKE 1 TABLET ONCE DAILY FOR REFLUX   triamcinolone cream 0.1 % Commonly known as: KENALOG Apply 1 application topically 2 (two) times daily. What changed:   when to take this  reasons to take this   Vitamin D 50 MCG (2000 UT) Caps Take 2,000 Units by mouth daily.        Follow-up: No follow-ups on file.  Claretta Fraise, M.D.

## 2019-10-07 LAB — CMP14+EGFR
ALT: 10 IU/L (ref 0–44)
AST: 20 IU/L (ref 0–40)
Albumin/Globulin Ratio: 1.9 (ref 1.2–2.2)
Albumin: 4.6 g/dL (ref 3.6–4.6)
Alkaline Phosphatase: 88 IU/L (ref 48–121)
BUN/Creatinine Ratio: 14 (ref 10–24)
BUN: 21 mg/dL (ref 8–27)
Bilirubin Total: 0.5 mg/dL (ref 0.0–1.2)
CO2: 25 mmol/L (ref 20–29)
Calcium: 9.7 mg/dL (ref 8.6–10.2)
Chloride: 102 mmol/L (ref 96–106)
Creatinine, Ser: 1.52 mg/dL — ABNORMAL HIGH (ref 0.76–1.27)
GFR calc Af Amer: 46 mL/min/{1.73_m2} — ABNORMAL LOW (ref >59)
GFR calc non Af Amer: 40 mL/min/{1.73_m2} — ABNORMAL LOW (ref >59)
Globulin, Total: 2.4 g/dL (ref 1.5–4.5)
Glucose: 101 mg/dL — ABNORMAL HIGH (ref 65–99)
Potassium: 4.4 mmol/L (ref 3.5–5.2)
Sodium: 143 mmol/L (ref 134–144)
Total Protein: 7 g/dL (ref 6.0–8.5)

## 2019-10-07 LAB — CBC WITH DIFFERENTIAL/PLATELET
Basophils Absolute: 0.1 10*3/uL (ref 0.0–0.2)
Basos: 1 %
EOS (ABSOLUTE): 0.1 10*3/uL (ref 0.0–0.4)
Eos: 1 %
Hematocrit: 39.4 % (ref 37.5–51.0)
Hemoglobin: 12.8 g/dL — ABNORMAL LOW (ref 13.0–17.7)
Immature Grans (Abs): 0 10*3/uL (ref 0.0–0.1)
Immature Granulocytes: 0 %
Lymphocytes Absolute: 2.5 10*3/uL (ref 0.7–3.1)
Lymphs: 35 %
MCH: 30.5 pg (ref 26.6–33.0)
MCHC: 32.5 g/dL (ref 31.5–35.7)
MCV: 94 fL (ref 79–97)
Monocytes Absolute: 0.7 10*3/uL (ref 0.1–0.9)
Monocytes: 9 %
Neutrophils Absolute: 3.9 10*3/uL (ref 1.4–7.0)
Neutrophils: 54 %
Platelets: 288 10*3/uL (ref 150–450)
RBC: 4.2 x10E6/uL (ref 4.14–5.80)
RDW: 17 % — ABNORMAL HIGH (ref 11.6–15.4)
WBC: 7.2 10*3/uL (ref 3.4–10.8)

## 2019-10-07 LAB — VITAMIN D 25 HYDROXY (VIT D DEFICIENCY, FRACTURES): Vit D, 25-Hydroxy: 34.5 ng/mL (ref 30.0–100.0)

## 2019-10-07 LAB — VITAMIN B12: Vitamin B-12: 513 pg/mL (ref 232–1245)

## 2019-10-07 LAB — MAGNESIUM: Magnesium: 2.2 mg/dL (ref 1.6–2.3)

## 2019-10-07 LAB — TSH+FREE T4
Free T4: 1.4 ng/dL (ref 0.82–1.77)
TSH: 1.9 u[IU]/mL (ref 0.450–4.500)

## 2019-10-07 NOTE — Progress Notes (Signed)
Hello Jabarie,  Your lab result is normal and/or stable.Some minor variations that are not significant are commonly marked abnormal, but do not represent any medical problem for you.  Best regards, Claretta Fraise, M.D.

## 2019-10-16 DIAGNOSIS — M222X1 Patellofemoral disorders, right knee: Secondary | ICD-10-CM | POA: Diagnosis not present

## 2019-10-16 DIAGNOSIS — M25561 Pain in right knee: Secondary | ICD-10-CM | POA: Diagnosis not present

## 2019-10-19 ENCOUNTER — Ambulatory Visit
Admission: RE | Admit: 2019-10-19 | Discharge: 2019-10-19 | Disposition: A | Discharge status: Home / Self Care | Payer: MEDICARE | Source: Ambulatory Visit | Attending: Family Medicine | Admitting: Family Medicine

## 2019-10-19 DIAGNOSIS — I700 Atherosclerosis of aorta: Secondary | ICD-10-CM | POA: Diagnosis not present

## 2019-10-19 DIAGNOSIS — S32592D Other specified fracture of left pubis, subsequent encounter for fracture with routine healing: Secondary | ICD-10-CM | POA: Diagnosis not present

## 2019-10-19 DIAGNOSIS — K449 Diaphragmatic hernia without obstruction or gangrene: Secondary | ICD-10-CM | POA: Diagnosis not present

## 2019-10-19 DIAGNOSIS — R634 Abnormal weight loss: Secondary | ICD-10-CM

## 2019-10-19 DIAGNOSIS — N281 Cyst of kidney, acquired: Secondary | ICD-10-CM | POA: Diagnosis not present

## 2019-10-19 MED ORDER — IOPAMIDOL (ISOVUE-300) INJECTION 61%
100.0000 mL | Freq: Once | INTRAVENOUS | Status: AC | PRN
  Administered 2019-10-19: 80 mL via INTRAVENOUS

## 2019-10-28 ENCOUNTER — Ambulatory Visit (HOSPITAL_COMMUNITY): Payer: MEDICARE

## 2019-10-28 ENCOUNTER — Other Ambulatory Visit: Payer: Self-pay

## 2019-10-28 ENCOUNTER — Other Ambulatory Visit (HOSPITAL_COMMUNITY): Payer: MEDICARE

## 2019-10-28 ENCOUNTER — Encounter: Payer: Self-pay | Admitting: Physical Therapy

## 2019-10-28 ENCOUNTER — Ambulatory Visit: Payer: MEDICARE | Attending: Orthopedic Surgery | Admitting: Physical Therapy

## 2019-10-28 DIAGNOSIS — R2681 Unsteadiness on feet: Secondary | ICD-10-CM | POA: Insufficient documentation

## 2019-10-28 DIAGNOSIS — G8929 Other chronic pain: Secondary | ICD-10-CM

## 2019-10-28 DIAGNOSIS — R600 Localized edema: Secondary | ICD-10-CM | POA: Insufficient documentation

## 2019-10-28 DIAGNOSIS — R262 Difficulty in walking, not elsewhere classified: Secondary | ICD-10-CM | POA: Diagnosis not present

## 2019-10-28 DIAGNOSIS — M25561 Pain in right knee: Secondary | ICD-10-CM | POA: Diagnosis not present

## 2019-10-28 NOTE — Therapy (Signed)
[facility] [place], [place], [address] Phone: [phone]   Fax:  [phone]  Physical Therapy Evaluation  Patient Details  Name: Jon Reid MRN: [id number] Date of Birth: [birth year] Referring Provider (PT): Rod Can, MD   Encounter Date: 10/28/2019   PT End of Session - 10/28/19 2031    Visit Number 1    Number of Visits 8    Date for PT Re-Evaluation 12/02/19    Authorization Type Progress note after 10th visit    PT Start Time 1300    PT Stop Time 1343    PT Time Calculation (min) 43 min    Activity Tolerance Patient tolerated treatment well    Behavior During Therapy [facility] for tasks assessed/performed           Past Medical History:  Diagnosis Date  . Arthritis   . Carcinoma of prostate [facility])    prostate  . CKD (chronic kidney disease) stage 2, GFR 60-89 ml/min   . Coronary artery disease    a. LHC (8/14):  3v CAD => s/p CABG (LIMA-LAD, SVG-OM1, SVG-PDA)  . First degree AV block 01/28/2019   Noted on EKG  . GERD (gastroesophageal reflux disease)   . Gout   . HTN (hypertension)   . Hx of echocardiogram    Echocardiogram 11/13/12: EF 55-60%, normal wall motion, biatrial enlargement, mild AI, mild TR, no pericardial effusion  . Macrocytosis without anemia 01/08/2012  . Moderate aortic stenosis 12/06/2017   Noted on ECHO  . 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 Sleep apnea    Questionable  . Spinal stenosis   . Stroke [facility])     Past Surgical History:  Procedure Laterality Date  . CARDIAC CATHETERIZATION  11/07/2012   Dr Acie Fredrickson  . CATARACT EXTRACTION W/ INTRAOCULAR LENS IMPLANT Bilateral   . CORONARY ARTERY BYPASS GRAFT N/A 11/11/2012   Procedure: CORONARY ARTERY BYPASS GRAFTING times four using Right Greater Saphenous Vein Graft harvested endoscopically and Left Internal Mammary Artery.;  Surgeon: Ivin Poot, MD;  Location: [place];   Service: Open Heart Surgery;  Laterality: N/A;  . INGUINAL HERNIA REPAIR Right 01/10/2017   Procedure: OPEN RIGHT HERNIA REPAIR INGUINAL;  Surgeon: Ileana Roup, MD;  Location: [facility];  Service: General;  Laterality: Right;  . INSERTION OF MESH Right 01/10/2017   Procedure: INSERTION OF MESH;  Surgeon: Ileana Roup, MD;  Location: [facility];  Service: General;  Laterality: Right;  . INTRAOPERATIVE TRANSESOPHAGEAL ECHOCARDIOGRAM N/A 11/11/2012   Procedure: INTRAOPERATIVE TRANSESOPHAGEAL ECHOCARDIOGRAM;  Surgeon: Ivin Poot, MD;  Location: [place];  Service: Open Heart Surgery;  Laterality: N/A;  . LEFT HEART CATHETERIZATION WITH CORONARY ANGIOGRAM N/A 11/07/2012   Procedure: LEFT HEART CATHETERIZATION WITH CORONARY ANGIOGRAM;  Surgeon: Thayer Headings, MD;  Location: [facility] CATH LAB;  Service: Cardiovascular;  Laterality: N/A;  . LUMBAR LAMINECTOMY/DECOMPRESSION MICRODISCECTOMY N/A 02/05/2019   Procedure: LUMBAR LAMINECTOMY/DECOMPRESSION L3-L4;  Surgeon: Latanya Maudlin, MD;  Location: [facility];  Service: Orthopedics;  Laterality: N/A;  67min  . LYMPH NODE DISSECTION     Bilateral pelvic  . RETROPUBIC PROSTATECTOMY     Radical    There were no vitals filed for this visit.    Subjective Assessment - 10/28/19 2027    Subjective COVID-19 screening performed upon arrival.Patient arrives to physical therapy with reports of right knee pain, difficulty walking, and frequent falls that progressively  worsened since November 2020. Patient reports 3 falls in the past 6 months due to an unknown cause. Patient reports pain limits his ability to stand, walk, and perform home activities. Patient reports he has a cane and a walker but does not use them stating he does not feel he needs them at this time. Patient reports pain at worst as 8/10 and pain at best as 0/10 with rest. Patient anticipating series of gel injections in the next few months. Patient's goals are to decrease pain improve movement,  improve strength, improve balance, and improve ability to perform ADLs and home tasks.    Pertinent History CKD, HTN, history of stroke, CABG x4, lumbar laminectomy/decompression microdiscectomy 02/05/2019    Limitations House hold activities;Walking;Standing;Sitting    Diagnostic tests x-ray & MRI: unsure of results per patient    Patient Stated Goals decrease pain, walk better    Currently in Pain? Yes    Pain Score 8     Pain Location Knee    Pain Orientation Right    Pain Descriptors / Indicators Aching;Sore    Pain Type Chronic pain    Pain Onset More than a month ago    Pain Frequency Constant    Aggravating Factors  standing and walking    Pain Relieving Factors not moving    Effect of Pain on Daily Activities pain with walking and activities              [facility] PT Assessment - 10/28/19 0001      Assessment   Medical Diagnosis Patellofemoral syndrome of right knee    Referring Provider (PT) Rod Can, MD    Onset Date/Surgical Date --   November 2020   Next MD Visit "next week"    Prior Therapy no      Precautions   Precautions Fall      Restrictions   Weight Bearing Restrictions No      Balance Screen   Has the patient fallen in the past 6 months Yes    How many times? 3    Has the patient had a decrease in activity level because of a fear of falling?  Yes    Is the patient reluctant to leave their home because of a fear of falling?  No      Home [employer] residence    Living Arrangements Other (Comment)   family     Prior Function   Level of Independence Independent with basic ADLs      Observation/Other Assessments-Edema    Edema Circumferential      Circumferential Edema   Circumferential - Right 39.2 cm at mid patella    Circumferential - Left  38.1 cm at mid patella      ROM / Strength   AROM / PROM / Strength AROM;PROM;Strength      AROM   Overall AROM  Deficits    AROM Assessment Site Knee    Right/Left  Knee Right    Right Knee Extension 5    Right Knee Flexion 130      PROM   Overall PROM  Deficits    PROM Assessment Site Knee    Right/Left Knee Right    Right Knee Extension 2    Right Knee Flexion 135      Strength   Overall Strength Deficits    Strength Assessment Site Hip;Knee    Right/Left Hip Right;Left    Right Hip Flexion 3+/5  Right Hip ABduction 3+/5   performed in sitting   Right Hip ADduction 3+/5   performed in sitting   Left Hip Flexion 3+/5    Left Hip ABduction 3+/5   performed in sitting   Left Hip ADduction 3+/5   performed in sitting   Right/Left Knee Right;Left    Right Knee Flexion 4/5    Right Knee Extension 4/5    Left Knee Flexion 4/5    Left Knee Extension 4/5      Palpation   Patella mobility WFL    Palpation comment minimal tenderness to palpation but reports pain in medial aspect of right knee and under the patella      Transfers   Five time sit to stand comments  21 seconds without UE support      Ambulation/Gait   Gait Pattern Step-through pattern;Decreased step length - right;Decreased step length - left;Decreased stride length;Decreased hip/knee flexion - right;Decreased weight shift to right;Right foot flat;Right flexed knee in stance;Antalgic;Trunk flexed      Balance   Balance Assessed Yes      Static Standing Balance   Static Standing - Balance Support No upper extremity supported    Static Standing - Level of Assistance 5: Stand by assistance    Static Standing Balance -  Activities  Single Leg Stance - Right Leg;Single Leg Stance - Left Leg;Romberg - Eyes Opened    Static Standing - Comment/# of Minutes 1 min WBOS, 1 min NBOS, able to achieve positions independently; unable to perform SLS without assistance                       Objective measurements completed on examination: See above findings.       [place] Adult PT Treatment/Exercise - 10/28/19 0001      Modalities   Modalities Electrical Stimulation       Electrical Stimulation   Electrical Stimulation Location right knee    Electrical Stimulation Action Pre-mod    Electrical Stimulation Parameters 80-150 hz x10 mins    Electrical Stimulation Goals Pain                  PT Education - 10/28/19 2030    Education Details LAQ, heel raise, toe raise, SLR, seated hip abduction    Person(s) Educated Patient    Methods Explanation;Demonstration;Handout    Comprehension Verbalized understanding;Returned demonstration            PT Short Term Goals - 10/28/19 2032      PT SHORT TERM GOAL #1   Title STG=LTG             PT Long Term Goals - 10/28/19 2032      PT LONG TERM GOAL #1   Title Patient will be independent with HEP.    Time 4    Period Weeks    Status New      PT LONG TERM GOAL #2   Title Patient will demonstrate 4+/5 bilateral LE MMT to improve stability during functional tasks.    Time 4    Period Weeks    Status New      PT LONG TERM GOAL #3   Title Patient will decrease risk of falls as noted by the ability to perfom modified 5x sit to stand with UE support in 18 seconds or less.    Time 4    Period Weeks    Status New      PT LONG TERM  GOAL #4   Title Patient will report ability to perform ADLs with right knee pain less than or equal to 3/10.    Time 4    Period Weeks    Status New                  Plan - 10/28/19 2043    Clinical Impression Statement Patient is an 86 year old male who presents to physical therapy with right knee pain, decreased bilateral LE MMT, increased R knee edema, and decreased balance that began November 2020. Patient reports pain at end range extension and flexion. Patient's 5x sit to stand test of 21 seconds with intermittent UE support categorizes him as a fall risk. Patient is very cautious with sit to stand transfers and bed mobility. Patient ambulates with deceased stride lengths, right knee flexion during stance, foot flat contact with trunk flexed forward.  Patient and PT discussed plan of care and discussed HEP to which he reported understanding. Patient would benefit from skilled physical therapy to address deficits and patient's goals.    Personal Factors and Comorbidities Comorbidity 3+;Age;Time since onset of injury/illness/exacerbation    Comorbidities CKD, HTN, history of stroke, CABG x4, lumbar laminectomy/decompression microdiscectomy 02/05/2019    Examination-Activity Limitations Locomotion Level;Transfers;Stairs;Stand    Stability/Clinical Decision Making Stable/Uncomplicated    Clinical Decision Making Low    Rehab Potential Fair    PT Frequency 2x / week    PT Duration 4 weeks    PT Treatment/Interventions ADLs/Self Care Home Management;Cryotherapy;Electrical Stimulation;Moist Heat;Therapeutic activities;Functional mobility training;Stair training;Gait training;Therapeutic exercise;Balance training;Neuromuscular re-education;Manual techniques;Passive range of motion;Patient/family education;Vasopneumatic Device    PT Next Visit Plan Nustep, pain free strengthening and ROM; hip strengthening, balance activities; modalities PRN for pain relief.    PT Home Exercise Plan see patient education section    Consulted and Agree with Plan of Care Patient           Patient will benefit from skilled therapeutic intervention in order to improve the following deficits and impairments:  Abnormal gait, Decreased activity tolerance, Decreased balance, Decreased mobility, Decreased strength, Increased edema, Pain, Difficulty walking, Decreased range of motion  Visit Diagnosis: Chronic pain of right knee  Localized edema  Difficulty in walking, not elsewhere classified  Unsteadiness on feet     Problem List Patient Active Problem List   Diagnosis Date Noted  . Iron deficiency anemia due to chronic blood loss 05/27/2019  . Nonrheumatic mitral valve regurgitation 05/26/2019  . Left anterior fascicular block 05/26/2019  . Educated about  COVID-19 virus infection 04/06/2019  . Drug-induced constipation 02/19/2019  . Elevated troponin 02/09/2019  . Fecal occult blood test positive 02/09/2019  . Spinal stenosis, lumbar region with neurogenic claudication 02/05/2019  . Leg swelling 01/26/2019  . Nonrheumatic aortic valve stenosis 01/26/2019  . Multiple joint pain 12/11/2018  . Chronic pain of both knees 11/07/2018  . Vitamin D deficiency 10/23/2018  . Gastroesophageal reflux disease with esophagitis 03/13/2018  . Murmur, cardiac 12/04/2017  . Coronary artery disease involving native coronary artery of native heart without angina pectoris 12/04/2017  . PVC's (premature ventricular contractions) 12/04/2017  . Heart disease 08/21/2017  . Fatigue 11/14/2016  . Abnormality of gait 12/06/2015  . Abdominal aortic atherosclerosis ([place]) 12/01/2015  . Degeneration of lumbar intervertebral disc 12/01/2015  . Exertional angina ([place]) 07/20/2015  . Hernia of abdominal wall 10/12/2014  . Gynecomastia 04/09/2013  . Hx of CABG 02/02/2013  . Myelodysplastic syndrome 11/06/2012  . Vitamin B12 deficiency 07/07/2012  .  Hyperlipemia 07/07/2012  . Peripheral vascular disease ([place]) 04/14/2009  . CARCINOMA, PROSTATE 04/13/2009  . Non-thrombocytopenic purpura ([place]) 04/13/2009  . Benign essential hypertension 04/13/2009  . Chronic renal insufficiency, stage III (moderate) 04/13/2009  . SLEEP APNEA 04/13/2009    Gabriela Eves, PT, DPT 10/28/2019, 8:45 PM  [facility] [address] [place], [place], [address] Phone: [phone]   Fax:  [phone]  Name: LABARON DIGIROLAMO MRN: [id number] Date of Birth: September 15, 1928

## 2019-11-02 ENCOUNTER — Ambulatory Visit
Admission: RE | Admit: 2019-11-02 | Discharge: 2019-11-02 | Disposition: A | Discharge status: Home / Self Care | Payer: MEDICARE | Source: Ambulatory Visit | Attending: Family Medicine | Admitting: Family Medicine

## 2019-11-02 ENCOUNTER — Other Ambulatory Visit: Payer: Self-pay

## 2019-11-02 DIAGNOSIS — R269 Unspecified abnormalities of gait and mobility: Secondary | ICD-10-CM | POA: Diagnosis not present

## 2019-11-02 DIAGNOSIS — G319 Degenerative disease of nervous system, unspecified: Secondary | ICD-10-CM | POA: Diagnosis not present

## 2019-11-02 DIAGNOSIS — J341 Cyst and mucocele of nose and nasal sinus: Secondary | ICD-10-CM | POA: Diagnosis not present

## 2019-11-02 DIAGNOSIS — I614 Nontraumatic intracerebral hemorrhage in cerebellum: Secondary | ICD-10-CM | POA: Diagnosis not present

## 2019-11-02 DIAGNOSIS — R29818 Other symptoms and signs involving the nervous system: Secondary | ICD-10-CM

## 2019-11-02 MED ORDER — GADOBENATE DIMEGLUMINE 529 MG/ML IV SOLN
13.0000 mL | Freq: Once | INTRAVENOUS | Status: AC | PRN
  Administered 2019-11-02: 13 mL via INTRAVENOUS

## 2019-11-03 DIAGNOSIS — M1711 Unilateral primary osteoarthritis, right knee: Secondary | ICD-10-CM | POA: Diagnosis not present

## 2019-11-05 ENCOUNTER — Ambulatory Visit: Payer: MEDICARE | Admitting: Physical Therapy

## 2019-11-05 ENCOUNTER — Other Ambulatory Visit: Payer: Self-pay

## 2019-11-05 DIAGNOSIS — G8929 Other chronic pain: Secondary | ICD-10-CM

## 2019-11-05 DIAGNOSIS — R600 Localized edema: Secondary | ICD-10-CM

## 2019-11-05 DIAGNOSIS — R2681 Unsteadiness on feet: Secondary | ICD-10-CM

## 2019-11-05 DIAGNOSIS — R262 Difficulty in walking, not elsewhere classified: Secondary | ICD-10-CM

## 2019-11-05 DIAGNOSIS — M25561 Pain in right knee: Secondary | ICD-10-CM | POA: Diagnosis not present

## 2019-11-05 NOTE — Therapy (Signed)
[facility] [place], [place], [address] Phone: [phone]   Fax:  [phone]  Physical Therapy Treatment  Patient Details  Name: Jon Reid MRN: [id number] Date of Birth: 1928/08/15 Referring Provider (PT): Rod Can, MD   Encounter Date: 11/05/2019   PT End of Session - 11/05/19 1108    Visit Number 2    Number of Visits 8    Date for PT Re-Evaluation 12/02/19    Authorization Type Progress note after 10th visit    PT Start Time 1030    PT Stop Time 1112    PT Time Calculation (min) 42 min    Activity Tolerance Patient tolerated treatment well    Behavior During Therapy [facility] for tasks assessed/performed           Past Medical History:  Diagnosis Date  . Arthritis   . Carcinoma of prostate [facility])    prostate  . CKD (chronic kidney disease) stage 2, GFR 60-89 ml/min   . Coronary artery disease    a. LHC (8/14):  3v CAD => s/p CABG (LIMA-LAD, SVG-OM1, SVG-PDA)  . First degree AV block 01/28/2019   Noted on EKG  . GERD (gastroesophageal reflux disease)   . Gout   . HTN (hypertension)   . Hx of echocardiogram    Echocardiogram 11/13/12: EF 55-60%, normal wall motion, biatrial enlargement, mild AI, mild TR, no pericardial effusion  . Macrocytosis without anemia 01/08/2012  . Moderate aortic stenosis 12/06/2017   Noted on ECHO  . 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 Sleep apnea    Questionable  . Spinal stenosis   . Stroke [facility])     Past Surgical History:  Procedure Laterality Date  . CARDIAC CATHETERIZATION  11/07/2012   Dr Acie Fredrickson  . CATARACT EXTRACTION W/ INTRAOCULAR LENS IMPLANT Bilateral   . CORONARY ARTERY BYPASS GRAFT N/A 11/11/2012   Procedure: CORONARY ARTERY BYPASS GRAFTING times four using Right Greater Saphenous Vein Graft harvested endoscopically and Left Internal Mammary Artery.;  Surgeon: Ivin Poot, MD;  Location: [place];  Service:  Open Heart Surgery;  Laterality: N/A;  . INGUINAL HERNIA REPAIR Right 01/10/2017   Procedure: OPEN RIGHT HERNIA REPAIR INGUINAL;  Surgeon: Ileana Roup, MD;  [facility];  Service: General;  Laterality: Right;  . INSERTION OF MESH Right 01/10/2017   Procedure: INSERTION OF MESH;  Surgeon: Ileana Roup, MD;  [facility];  Service: General;  Laterality: Right;  . INTRAOPERATIVE TRANSESOPHAGEAL ECHOCARDIOGRAM N/A 11/11/2012   Procedure: INTRAOPERATIVE TRANSESOPHAGEAL ECHOCARDIOGRAM;  Surgeon: Ivin Poot, MD;  Location: [place];  Service: Open Heart Surgery;  Laterality: N/A;  . LEFT HEART CATHETERIZATION WITH CORONARY ANGIOGRAM N/A 11/07/2012   Procedure: LEFT HEART CATHETERIZATION WITH CORONARY ANGIOGRAM;  Surgeon: Thayer Headings, MD;  Location: [facility] CATH LAB;  Service: Cardiovascular;  Laterality: N/A;  . LUMBAR LAMINECTOMY/DECOMPRESSION MICRODISCECTOMY N/A 02/05/2019   Procedure: LUMBAR LAMINECTOMY/DECOMPRESSION L3-L4;  Surgeon: Latanya Maudlin, MD;  Location[facility];  Service: Orthopedics;  Laterality: N/A;  1min  . LYMPH NODE DISSECTION     Bilateral pelvic  . RETROPUBIC PROSTATECTOMY     Radical    There were no vitals filed for this visit.   Subjective Assessment - 11/05/19 1035    Subjective COVID-19 screening performed upon arrival. Patient arrived with some ongoing knee pain    Pertinent History CKD, HTN, history of stroke, CABG  x4, lumbar laminectomy/decompression microdiscectomy 02/05/2019    Limitations House hold activities;Walking;Standing;Sitting    Diagnostic tests x-ray & MRI: unsure of results per patient    Patient Stated Goals decrease pain, walk better    Currently in Pain? Yes    Pain Score --   no number provided   Pain Location Knee    Pain Orientation Right    Pain Descriptors / Indicators Sore    Pain Type Chronic pain    Pain Onset More than a month ago    Pain Frequency Constant    Aggravating Factors  standing/walking    Pain  Relieving Factors rest                             [facility] Adult PT Treatment/Exercise - 11/05/19 0001      Exercises   Exercises Lumbar;Knee/Hip      Lumbar Exercises: Supine   Straight Leg Raise 15 reps   right LE     Knee/Hip Exercises: Standing   Heel Raises Both;1 set;10 reps      Knee/Hip Exercises: Seated   Long Arc Quad AROM;Both;2 sets;10 reps    Ball Squeeze 27min    Other Seated Knee/Hip Exercises clam with yellow band x30    Marching Strengthening;Both;2 sets;10 reps    Marching Limitations yellow band    Sit to [employer] 1 set;5 reps      Acupuncturist Location right knee    Electrical Stimulation Action premod    Electrical Stimulation Parameters 80-150hz  x87min    Electrical Stimulation Goals Pain                    PT Short Term Goals - 10/28/19 2032      PT SHORT TERM GOAL #1   Title STG=LTG             PT Long Term Goals - 11/05/19 1037      PT LONG TERM GOAL #1   Title Patient will be independent with HEP.    Time 4    Period Weeks    Status On-going      PT LONG TERM GOAL #2   Title Patient will demonstrate 4+/5 bilateral LE MMT to improve stability during functional tasks.    Time 4    Period Weeks    Status On-going      PT LONG TERM GOAL #3   Title Patient will decrease risk of falls as noted by the ability to perfom modified 5x sit to stand with UE support in 18 seconds or less.    Time 4    Period Weeks    Status On-going      PT LONG TERM GOAL #4   Title Patient will report ability to perform ADLs with right knee pain less than or equal to 3/10.    Time 4    Period Weeks    Status On-going                 Plan - 11/05/19 1109    Clinical Impression Statement Patient tolerated treatment well today. Patient arrived with no assistive device and a slow shuffle gait pattern. SBA throughtout for safety. Patient able to complete all sitting and supine exercises with  good technique today. Patient continues to have knee discomfort with walking and standing. Patient current goals ongoing due to limitations.    Personal Factors and Comorbidities Comorbidity  3+;Age;Time since onset of injury/illness/exacerbation    Comorbidities CKD, HTN, history of stroke, CABG x4, lumbar laminectomy/decompression microdiscectomy 02/05/2019    Examination-Activity Limitations Locomotion Level;Transfers;Stairs;Stand    Stability/Clinical Decision Making Stable/Uncomplicated    Rehab Potential Fair    PT Frequency 2x / week    PT Duration 4 weeks    PT Treatment/Interventions ADLs/Self Care Home Management;Cryotherapy;Electrical Stimulation;Moist Heat;Therapeutic activities;Functional mobility training;Stair training;Gait training;Therapeutic exercise;Balance training;Neuromuscular re-education;Manual techniques;Passive range of motion;Patient/family education;Vasopneumatic Device    PT Next Visit Plan Nustep, pain free strengthening and ROM; hip strengthening, balance activities; modalities PRN for pain relief.    Consulted and Agree with Plan of Care Patient           Patient will benefit from skilled therapeutic intervention in order to improve the following deficits and impairments:  Abnormal gait, Decreased activity tolerance, Decreased balance, Decreased mobility, Decreased strength, Increased edema, Pain, Difficulty walking, Decreased range of motion  Visit Diagnosis: Chronic pain of right knee  Localized edema  Difficulty in walking, not elsewhere classified  Unsteadiness on feet     Problem List Patient Active Problem List   Diagnosis Date Noted  . Iron deficiency anemia due to chronic blood loss 05/27/2019  . Nonrheumatic mitral valve regurgitation 05/26/2019  . Left anterior fascicular block 05/26/2019  . Educated about COVID-19 virus infection 04/06/2019  . Drug-induced constipation 02/19/2019  . Elevated troponin 02/09/2019  . Fecal occult blood  test positive 02/09/2019  . Spinal stenosis, lumbar region with neurogenic claudication 02/05/2019  . Leg swelling 01/26/2019  . Nonrheumatic aortic valve stenosis 01/26/2019  . Multiple joint pain 12/11/2018  . Chronic pain of both knees 11/07/2018  . Vitamin D deficiency 10/23/2018  . Gastroesophageal reflux disease with esophagitis 03/13/2018  . Murmur, cardiac 12/04/2017  . Coronary artery disease involving native coronary artery of native heart without angina pectoris 12/04/2017  . PVC's (premature ventricular contractions) 12/04/2017  . Heart disease 08/21/2017  . Fatigue 11/14/2016  . Abnormality of gait 12/06/2015  . Abdominal aortic atherosclerosis ([place]) 12/01/2015  . Degeneration of lumbar intervertebral disc 12/01/2015  . Exertional angina ([place]) 07/20/2015  . Hernia of abdominal wall 10/12/2014  . Gynecomastia 04/09/2013  . Hx of CABG 02/02/2013  . Myelodysplastic syndrome 11/06/2012  . Vitamin B12 deficiency 07/07/2012  . Hyperlipemia 07/07/2012  . Peripheral vascular disease ([place]) 04/14/2009  . CARCINOMA, PROSTATE 04/13/2009  . Non-thrombocytopenic purpura ([place]) 04/13/2009  . Benign essential hypertension 04/13/2009  . Chronic renal insufficiency, stage III (moderate) 04/13/2009  . SLEEP APNEA 04/13/2009    Jon Reid P , PTA 11/05/2019, 11:17 AM  [facility] [place], [place], [address] Phone: [phone]   Fax:  [phone]  Name: TAEVIN MCFERRAN MRN: [id number] Date of Birth: 1929-03-14

## 2019-11-06 ENCOUNTER — Telehealth: Payer: Self-pay | Admitting: Family Medicine

## 2019-11-06 NOTE — Telephone Encounter (Signed)
Reviewed scans with pt.

## 2019-11-10 ENCOUNTER — Ambulatory Visit: Payer: MEDICARE | Admitting: Physical Therapy

## 2019-11-10 ENCOUNTER — Ambulatory Visit: Payer: MEDICARE | Admitting: Family Medicine

## 2019-11-10 ENCOUNTER — Other Ambulatory Visit: Payer: Self-pay

## 2019-11-10 ENCOUNTER — Encounter: Payer: Self-pay | Admitting: Physical Therapy

## 2019-11-10 DIAGNOSIS — M25561 Pain in right knee: Secondary | ICD-10-CM | POA: Diagnosis not present

## 2019-11-10 DIAGNOSIS — M1711 Unilateral primary osteoarthritis, right knee: Secondary | ICD-10-CM | POA: Diagnosis not present

## 2019-11-10 DIAGNOSIS — R262 Difficulty in walking, not elsewhere classified: Secondary | ICD-10-CM

## 2019-11-10 DIAGNOSIS — G8929 Other chronic pain: Secondary | ICD-10-CM

## 2019-11-10 DIAGNOSIS — R2681 Unsteadiness on feet: Secondary | ICD-10-CM

## 2019-11-10 DIAGNOSIS — R600 Localized edema: Secondary | ICD-10-CM

## 2019-11-10 NOTE — Therapy (Signed)
[facility] [place], [place], [address] Phone: [phone]   Fax:  [phone]  Physical Therapy Treatment  Patient Details  Name: Jon Reid MRN: [id number] Date of Birth: 1929-02-14 Referring Provider (PT): Rod Can, MD   Encounter Date: 11/10/2019   PT End of Session - 11/10/19 0946    Visit Number 3    Number of Visits 8    Date for PT Re-Evaluation 12/02/19    Authorization Type Progress note after 10th visit    PT Start Time 0948    PT Stop Time 1030    PT Time Calculation (min) 42 min    Activity Tolerance Patient tolerated treatment well    Behavior During Therapy [facility] for tasks assessed/performed           Past Medical History:  Diagnosis Date  . Arthritis   . Carcinoma of prostate [facility])    prostate  . CKD (chronic kidney disease) stage 2, GFR 60-89 ml/min   . Coronary artery disease    a. LHC (8/14):  3v CAD => s/p CABG (LIMA-LAD, SVG-OM1, SVG-PDA)  . First degree AV block 01/28/2019   Noted on EKG  . GERD (gastroesophageal reflux disease)   . Gout   . HTN (hypertension)   . Hx of echocardiogram    Echocardiogram 11/13/12: EF 55-60%, normal wall motion, biatrial enlargement, mild AI, mild TR, no pericardial effusion  . Macrocytosis without anemia 01/08/2012  . Moderate aortic stenosis 12/06/2017   Noted on ECHO  . 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 Sleep apnea    Questionable  . Spinal stenosis   . Stroke [facility])     Past Surgical History:  Procedure Laterality Date  . CARDIAC CATHETERIZATION  11/07/2012   Dr Acie Fredrickson  . CATARACT EXTRACTION W/ INTRAOCULAR LENS IMPLANT Bilateral   . CORONARY ARTERY BYPASS GRAFT N/A 11/11/2012   Procedure: CORONARY ARTERY BYPASS GRAFTING times four using Right Greater Saphenous Vein Graft harvested endoscopically and Left Internal Mammary Artery.;  Surgeon: Ivin Poot, MD;  Location: [place];  Service:  Open Heart Surgery;  Laterality: N/A;  . INGUINAL HERNIA REPAIR Right 01/10/2017   Procedure: OPEN RIGHT HERNIA REPAIR INGUINAL;  Surgeon: Ileana Roup, MD;  [facility];  Service: General;  Laterality: Right;  . INSERTION OF MESH Right 01/10/2017   Procedure: INSERTION OF MESH;  Surgeon: Ileana Roup, MD;  [facility];  Service: General;  Laterality: Right;  . INTRAOPERATIVE TRANSESOPHAGEAL ECHOCARDIOGRAM N/A 11/11/2012   Procedure: INTRAOPERATIVE TRANSESOPHAGEAL ECHOCARDIOGRAM;  Surgeon: Ivin Poot, MD;  Location: [place];  Service: Open Heart Surgery;  Laterality: N/A;  . LEFT HEART CATHETERIZATION WITH CORONARY ANGIOGRAM N/A 11/07/2012   Procedure: LEFT HEART CATHETERIZATION WITH CORONARY ANGIOGRAM;  Surgeon: Thayer Headings, MD;  Location: [facility] CATH LAB;  Service: Cardiovascular;  Laterality: N/A;  . LUMBAR LAMINECTOMY/DECOMPRESSION MICRODISCECTOMY N/A 02/05/2019   Procedure: LUMBAR LAMINECTOMY/DECOMPRESSION L3-L4;  Surgeon: Latanya Maudlin, MD;  [facility];  Service: Orthopedics;  Laterality: N/A;  37min  . LYMPH NODE DISSECTION     Bilateral pelvic  . RETROPUBIC PROSTATECTOMY     Radical    There were no vitals filed for this visit.   Subjective Assessment - 11/10/19 0944    Subjective COVID-19 screening performed upon arrival. Patient reports continued R knee pain.    Pertinent History CKD, HTN, history of stroke, CABG x4,  lumbar laminectomy/decompression microdiscectomy 02/05/2019    Limitations House hold activities;Walking;Standing;Sitting    Diagnostic tests x-ray & MRI: unsure of results per patient    Patient Stated Goals decrease pain, walk better    Currently in Pain? Yes    Pain Score --   Did not provide pain score   Pain Location Knee    Pain Orientation Right    Pain Descriptors / Indicators Sore    Pain Type Chronic pain    Pain Onset More than a month ago    Pain Frequency Constant              [facility] PT Assessment - 11/10/19  0001      Assessment   Medical Diagnosis Patellofemoral syndrome of right knee    Referring Provider (PT) Rod Can, MD    Next MD Visit 11/10/2019    Prior Therapy no      Precautions   Precautions Fall      Restrictions   Weight Bearing Restrictions No                         [facility] Adult PT Treatment/Exercise - 11/10/19 0001      Knee/Hip Exercises: Aerobic   Nustep L6 x15 min      Knee/Hip Exercises: Machines for Strengthening   Cybex Knee Extension 10# 2x10 reps    Cybex Knee Flexion 30# 2x10 reps      Knee/Hip Exercises: Seated   Clamshell with TheraBand Yellow   x20 reps   Other Seated Knee/Hip Exercises B heel/toe raises x20 reps    Marching Strengthening;Both;2 sets;10 reps    Marching Limitations yellow band    Sit to [place] 10 reps;with UE support;without UE support   initially requires UE support     Knee/Hip Exercises: Supine   Short Arc Quad Sets Strengthening;Both;20 reps    Bridges Strengthening;Both;15 reps    Straight Leg Raises Strengthening;Right;15 reps                    PT Short Term Goals - 10/28/19 2032      PT SHORT TERM GOAL #1   Title STG=LTG             PT Long Term Goals - 11/05/19 1037      PT LONG TERM GOAL #1   Title Patient will be independent with HEP.    Time 4    Period Weeks    Status On-going      PT LONG TERM GOAL #2   Title Patient will demonstrate 4+/5 bilateral LE MMT to improve stability during functional tasks.    Time 4    Period Weeks    Status On-going      PT LONG TERM GOAL #3   Title Patient will decrease risk of falls as noted by the ability to perfom modified 5x sit to stand with UE support in 18 seconds or less.    Time 4    Period Weeks    Status On-going      PT LONG TERM GOAL #4   Title Patient will report ability to perform ADLs with right knee pain less than or equal to 3/10.    Time 4    Period Weeks    Status On-going                 Plan - 11/10/19  1144    Clinical Impression Statement Patient presented in  clinic today with continued R knee pain. Patient did not have AD and was observed in trunk flexion with shuffling gait. Patient progressed for strengthening exercises in various positions with only reports of fatigue provided. Patient monitored to correct any technique deviations. Patient initially required UE support for sit <> stands but able to complete some reps without UE support.    Personal Factors and Comorbidities Comorbidity 3+;Age;Time since onset of injury/illness/exacerbation    Comorbidities CKD, HTN, history of stroke, CABG x4, lumbar laminectomy/decompression microdiscectomy 02/05/2019    Examination-Activity Limitations Locomotion Level;Transfers;Stairs;Stand    Stability/Clinical Decision Making Stable/Uncomplicated    Rehab Potential Fair    PT Frequency 2x / week    PT Duration 4 weeks    PT Treatment/Interventions ADLs/Self Care Home Management;Cryotherapy;Electrical Stimulation;Moist Heat;Therapeutic activities;Functional mobility training;Stair training;Gait training;Therapeutic exercise;Balance training;Neuromuscular re-education;Manual techniques;Passive range of motion;Patient/family education;Vasopneumatic Device    PT Next Visit Plan Nustep, pain free strengthening and ROM; hip strengthening, balance activities; modalities PRN for pain relief.    PT Home Exercise Plan see patient education section    Consulted and Agree with Plan of Care Patient           Patient will benefit from skilled therapeutic intervention in order to improve the following deficits and impairments:  Abnormal gait, Decreased activity tolerance, Decreased balance, Decreased mobility, Decreased strength, Increased edema, Pain, Difficulty walking, Decreased range of motion  Visit Diagnosis: Chronic pain of right knee  Localized edema  Difficulty in walking, not elsewhere classified  Unsteadiness on feet     Problem List Patient  Active Problem List   Diagnosis Date Noted  . Iron deficiency anemia due to chronic blood loss 05/27/2019  . Nonrheumatic mitral valve regurgitation 05/26/2019  . Left anterior fascicular block 05/26/2019  . Educated about COVID-19 virus infection 04/06/2019  . Drug-induced constipation 02/19/2019  . Elevated troponin 02/09/2019  . Fecal occult blood test positive 02/09/2019  . Spinal stenosis, lumbar region with neurogenic claudication 02/05/2019  . Leg swelling 01/26/2019  . Nonrheumatic aortic valve stenosis 01/26/2019  . Multiple joint pain 12/11/2018  . Chronic pain of both knees 11/07/2018  . Vitamin D deficiency 10/23/2018  . Gastroesophageal reflux disease with esophagitis 03/13/2018  . Murmur, cardiac 12/04/2017  . Coronary artery disease involving native coronary artery of native heart without angina pectoris 12/04/2017  . PVC's (premature ventricular contractions) 12/04/2017  . Heart disease 08/21/2017  . Fatigue 11/14/2016  . Abnormality of gait 12/06/2015  . Abdominal aortic atherosclerosis ([employer]) 12/01/2015  . Degeneration of lumbar intervertebral disc 12/01/2015  . Exertional angina ([place]) 07/20/2015  . Hernia of abdominal wall 10/12/2014  . Gynecomastia 04/09/2013  . Hx of CABG 02/02/2013  . Myelodysplastic syndrome 11/06/2012  . Vitamin B12 deficiency 07/07/2012  . Hyperlipemia 07/07/2012  . Peripheral vascular disease ([place]) 04/14/2009  . CARCINOMA, PROSTATE 04/13/2009  . Non-thrombocytopenic purpura ([place]) 04/13/2009  . Benign essential hypertension 04/13/2009  . Chronic renal insufficiency, stage III (moderate) 04/13/2009  . SLEEP APNEA 04/13/2009    Standley Brooking, PTA 11/10/2019, 11:48 AM  [facility] [address] [place], [place], [address] Phone: [phone]   Fax:  [phone]  Name: Jon Reid MRN: [id number] Date of Birth: 04-03-1928

## 2019-11-12 ENCOUNTER — Ambulatory Visit: Payer: MEDICARE | Admitting: Physical Therapy

## 2019-11-12 ENCOUNTER — Encounter: Payer: Self-pay | Admitting: Physical Therapy

## 2019-11-12 ENCOUNTER — Other Ambulatory Visit: Payer: Self-pay

## 2019-11-12 DIAGNOSIS — R262 Difficulty in walking, not elsewhere classified: Secondary | ICD-10-CM

## 2019-11-12 DIAGNOSIS — M25561 Pain in right knee: Secondary | ICD-10-CM | POA: Diagnosis not present

## 2019-11-12 DIAGNOSIS — G8929 Other chronic pain: Secondary | ICD-10-CM

## 2019-11-12 DIAGNOSIS — R2681 Unsteadiness on feet: Secondary | ICD-10-CM

## 2019-11-12 DIAGNOSIS — R600 Localized edema: Secondary | ICD-10-CM

## 2019-11-12 NOTE — Therapy (Signed)
[facility] [place], [place], [address] Phone: [phone]   Fax:  [phone]  Physical Therapy Treatment  Patient Details  Name: Jon Reid MRN: [id number] Date of Birth: May 11, 1928 Referring Provider (PT): Rod Can, MD   Encounter Date: 11/12/2019   PT End of Session - 11/12/19 0957    Visit Number 3    Number of Visits 8    Date for PT Re-Evaluation 12/02/19    Authorization Type Progress note after 10th visit    PT Start Time 0945    PT Stop Time 1025    PT Time Calculation (min) 40 min    Activity Tolerance Patient tolerated treatment well    Behavior During Therapy [facility] for tasks assessed/performed           Past Medical History:  Diagnosis Date  . Arthritis   . Carcinoma of prostate [facility])    prostate  . CKD (chronic kidney disease) stage 2, GFR 60-89 ml/min   . Coronary artery disease    a. LHC (8/14):  3v CAD => s/p CABG (LIMA-LAD, SVG-OM1, SVG-PDA)  . First degree AV block 01/28/2019   Noted on EKG  . GERD (gastroesophageal reflux disease)   . Gout   . HTN (hypertension)   . Hx of echocardiogram    Echocardiogram 11/13/12: EF 55-60%, normal wall motion, biatrial enlargement, mild AI, mild TR, no pericardial effusion  . Macrocytosis without anemia 01/08/2012  . Moderate aortic stenosis 12/06/2017   Noted on ECHO  . 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 Sleep apnea    Questionable  . Spinal stenosis   . Stroke [facility])     Past Surgical History:  Procedure Laterality Date  . CARDIAC CATHETERIZATION  11/07/2012   Dr Acie Fredrickson  . CATARACT EXTRACTION W/ INTRAOCULAR LENS IMPLANT Bilateral   . CORONARY ARTERY BYPASS GRAFT N/A 11/11/2012   Procedure: CORONARY ARTERY BYPASS GRAFTING times four using Right Greater Saphenous Vein Graft harvested endoscopically and Left Internal Mammary Artery.;  Surgeon: Ivin Poot, MD;  Location: [place];  Service:  Open Heart Surgery;  Laterality: N/A;  . INGUINAL HERNIA REPAIR Right 01/10/2017   Procedure: OPEN RIGHT HERNIA REPAIR INGUINAL;  Surgeon: Ileana Roup, MD;  Location: [facility];  Service: General;  Laterality: Right;  . INSERTION OF MESH Right 01/10/2017   Procedure: INSERTION OF MESH;  Surgeon: Ileana Roup, MD;  [facility];  Service: General;  Laterality: Right;  . INTRAOPERATIVE TRANSESOPHAGEAL ECHOCARDIOGRAM N/A 11/11/2012   Procedure: INTRAOPERATIVE TRANSESOPHAGEAL ECHOCARDIOGRAM;  Surgeon: Ivin Poot, MD;  Location: [place];  Service: Open Heart Surgery;  Laterality: N/A;  . LEFT HEART CATHETERIZATION WITH CORONARY ANGIOGRAM N/A 11/07/2012   Procedure: LEFT HEART CATHETERIZATION WITH CORONARY ANGIOGRAM;  Surgeon: Thayer Headings, MD;  Location: [facility] CATH LAB;  Service: Cardiovascular;  Laterality: N/A;  . LUMBAR LAMINECTOMY/DECOMPRESSION MICRODISCECTOMY N/A 02/05/2019   Procedure: LUMBAR LAMINECTOMY/DECOMPRESSION L3-L4;  Surgeon: Latanya Maudlin, MD;  Location[facility];  Service: Orthopedics;  Laterality: N/A;  49min  . LYMPH NODE DISSECTION     Bilateral pelvic  . RETROPUBIC PROSTATECTOMY     Radical    There were no vitals filed for this visit.   Subjective Assessment - 11/12/19 0955    Subjective COVID-19 screening performed upon arrival. Patient reporting getting second shot in his R knee yesterday.    Pertinent History CKD, HTN, history  of stroke, CABG x4, lumbar laminectomy/decompression microdiscectomy 02/05/2019    Limitations House hold activities;Walking;Standing;Sitting    Diagnostic tests x-ray & MRI: unsure of results per patient    Patient Stated Goals decrease pain, walk better    Currently in Pain? Yes    Pain Score 5     Pain Location Knee    Pain Orientation Right    Pain Descriptors / Indicators Aching;Sore    Pain Type Chronic pain    Pain Onset More than a month ago    Pain Frequency Constant                              [facility] Adult PT Treatment/Exercise - 11/12/19 0001      Ambulation/Gait   Gait Comments pt instructed in heel to toe gait pattern and improvements in posture holding head up instead of looking down when walking. Pt also instructed in step through gait pattern.       Exercises   Exercises Knee/Hip      Knee/Hip Exercises: Aerobic   Nustep L5 x12 min      Knee/Hip Exercises: Machines for Strengthening   Cybex Knee Extension 10# 2x10 reps    Cybex Knee Flexion 30# 2x10 reps      Knee/Hip Exercises: Standing   Heel Raises Both;15 reps    Hip Abduction Stengthening;Both;15 reps;Limitations    Abduction Limitations UE support    Forward Step Up Both;10 reps;Hand Hold: 2;Step Height: 4"    Rocker Board 2 minutes      Knee/Hip Exercises: Seated   Clamshell with TheraBand Red    Other Seated Knee/Hip Exercises hamstring curls with red theraband x 15 on each LE    Sit to Sand 10 reps;with UE support;without UE support   initially requires UE support                   PT Short Term Goals - 10/28/19 2032      PT SHORT TERM GOAL #1   Title STG=LTG             PT Long Term Goals - 11/12/19 1030      PT LONG TERM GOAL #1   Title Patient will be independent with HEP.    Status On-going      PT LONG TERM GOAL #2   Status On-going      PT LONG TERM GOAL #3   Title Patient will decrease risk of falls as noted by the ability to perfom modified 5x sit to stand with UE support in 18 seconds or less.    Status On-going      PT LONG TERM GOAL #4   Title Patient will report ability to perform ADLs with right knee pain less than or equal to 3/10.    Status On-going                 Plan - 11/12/19 8937    Clinical Impression Statement Pt presenting following injection in his R knee yesterday. Pt still reporting 5/10 pain in his R knee. Pt with shuffling gait pattern and needed instructions for heel to toe gait. Pt requiring  instuctions for all exercise techniques. Continue skilled PT.    Personal Factors and Comorbidities Comorbidity 3+;Age;Time since onset of injury/illness/exacerbation    Comorbidities CKD, HTN, history of stroke, CABG x4, lumbar laminectomy/decompression microdiscectomy 02/05/2019    Examination-Activity Limitations Locomotion Level;Transfers;Stairs;Stand    Stability/Clinical  Decision Making Stable/Uncomplicated    Rehab Potential Fair    PT Frequency 2x / week    PT Treatment/Interventions ADLs/Self Care Home Management;Cryotherapy;Electrical Stimulation;Moist Heat;Therapeutic activities;Functional mobility training;Stair training;Gait training;Therapeutic exercise;Balance training;Neuromuscular re-education;Manual techniques;Passive range of motion;Patient/family education;Vasopneumatic Device    PT Next Visit Plan Nustep, pain free strengthening and ROM; hip strengthening, balance activities; modalities PRN for pain relief.    PT Home Exercise Plan see patient education section    Consulted and Agree with Plan of Care Patient           Patient will benefit from skilled therapeutic intervention in order to improve the following deficits and impairments:  Abnormal gait, Decreased activity tolerance, Decreased balance, Decreased mobility, Decreased strength, Increased edema, Pain, Difficulty walking, Decreased range of motion  Visit Diagnosis: Chronic pain of right knee  Localized edema  Difficulty in walking, not elsewhere classified  Unsteadiness on feet     Problem List Patient Active Problem List   Diagnosis Date Noted  . Iron deficiency anemia due to chronic blood loss 05/27/2019  . Nonrheumatic mitral valve regurgitation 05/26/2019  . Left anterior fascicular block 05/26/2019  . Educated about COVID-19 virus infection 04/06/2019  . Drug-induced constipation 02/19/2019  . Elevated troponin 02/09/2019  . Fecal occult blood test positive 02/09/2019  . Spinal stenosis,  lumbar region with neurogenic claudication 02/05/2019  . Leg swelling 01/26/2019  . Nonrheumatic aortic valve stenosis 01/26/2019  . Multiple joint pain 12/11/2018  . Chronic pain of both knees 11/07/2018  . Vitamin D deficiency 10/23/2018  . Gastroesophageal reflux disease with esophagitis 03/13/2018  . Murmur, cardiac 12/04/2017  . Coronary artery disease involving native coronary artery of native heart without angina pectoris 12/04/2017  . PVC's (premature ventricular contractions) 12/04/2017  . Heart disease 08/21/2017  . Fatigue 11/14/2016  . Abnormality of gait 12/06/2015  . Abdominal aortic atherosclerosis ([place]) 12/01/2015  . Degeneration of lumbar intervertebral disc 12/01/2015  . Exertional angina ([place]) 07/20/2015  . Hernia of abdominal wall 10/12/2014  . Gynecomastia 04/09/2013  . Hx of CABG 02/02/2013  . Myelodysplastic syndrome 11/06/2012  . Vitamin B12 deficiency 07/07/2012  . Hyperlipemia 07/07/2012  . Peripheral vascular disease ([place]) 04/14/2009  . CARCINOMA, PROSTATE 04/13/2009  . Non-thrombocytopenic purpura ([place]) 04/13/2009  . Benign essential hypertension 04/13/2009  . Chronic renal insufficiency, stage III (moderate) 04/13/2009  . SLEEP APNEA 04/13/2009    Oretha Caprice, PT, MPT 11/12/2019, 10:33 AM  [facility] [address] [place], [place], [address] Phone: [phone]   Fax:  [phone]  Name: SKYLER CAREL MRN: [id number] Date of Birth: 02/20/1929

## 2019-11-17 ENCOUNTER — Ambulatory Visit: Payer: MEDICARE | Admitting: Physical Therapy

## 2019-11-17 ENCOUNTER — Other Ambulatory Visit: Payer: Self-pay

## 2019-11-17 DIAGNOSIS — G8929 Other chronic pain: Secondary | ICD-10-CM

## 2019-11-17 DIAGNOSIS — R2681 Unsteadiness on feet: Secondary | ICD-10-CM

## 2019-11-17 DIAGNOSIS — M25561 Pain in right knee: Secondary | ICD-10-CM | POA: Diagnosis not present

## 2019-11-17 DIAGNOSIS — R262 Difficulty in walking, not elsewhere classified: Secondary | ICD-10-CM

## 2019-11-17 DIAGNOSIS — R600 Localized edema: Secondary | ICD-10-CM

## 2019-11-17 DIAGNOSIS — M1711 Unilateral primary osteoarthritis, right knee: Secondary | ICD-10-CM | POA: Diagnosis not present

## 2019-11-17 NOTE — Therapy (Signed)
[facility] [place], [place], [address] Phone: [phone]   Fax:  [phone]  Physical Therapy Treatment  Patient Details  Name: Jon Reid MRN: [id number] Date of Birth: 06-Jun-1928 Referring Provider (PT): Rod Can, MD   Encounter Date: 11/17/2019   PT End of Session - 11/17/19 1003    Visit Number 4    Number of Visits 8    Date for PT Re-Evaluation 12/02/19    Authorization Type Progress note after 10th visit    PT Start Time 0945    PT Stop Time 1030    PT Time Calculation (min) 45 min    Activity Tolerance Patient tolerated treatment well    Behavior During Therapy [facility] for tasks assessed/performed           Past Medical History:  Diagnosis Date  . Arthritis   . Carcinoma of prostate [facility])    prostate  . CKD (chronic kidney disease) stage 2, GFR 60-89 ml/min   . Coronary artery disease    a. LHC (8/14):  3v CAD => s/p CABG (LIMA-LAD, SVG-OM1, SVG-PDA)  . First degree AV block 01/28/2019   Noted on EKG  . GERD (gastroesophageal reflux disease)   . Gout   . HTN (hypertension)   . Hx of echocardiogram    Echocardiogram 11/13/12: EF 55-60%, normal wall motion, biatrial enlargement, mild AI, mild TR, no pericardial effusion  . Macrocytosis without anemia 01/08/2012  . Moderate aortic stenosis 12/06/2017   Noted on ECHO  . 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 Sleep apnea    Questionable  . Spinal stenosis   . Stroke [facility])     Past Surgical History:  Procedure Laterality Date  . CARDIAC CATHETERIZATION  11/07/2012   Dr Acie Fredrickson  . CATARACT EXTRACTION W/ INTRAOCULAR LENS IMPLANT Bilateral   . CORONARY ARTERY BYPASS GRAFT N/A 11/11/2012   Procedure: CORONARY ARTERY BYPASS GRAFTING times four using Right Greater Saphenous Vein Graft harvested endoscopically and Left Internal Mammary Artery.;  Surgeon: Ivin Poot, MD;  Location: [place];  Service:  Open Heart Surgery;  Laterality: N/A;  . INGUINAL HERNIA REPAIR Right 01/10/2017   Procedure: OPEN RIGHT HERNIA REPAIR INGUINAL;  Surgeon: Ileana Roup, MD;  [facility];  Service: General;  Laterality: Right;  . INSERTION OF MESH Right 01/10/2017   Procedure: INSERTION OF MESH;  Surgeon: Ileana Roup, MD;  [facility];  Service: General;  Laterality: Right;  . INTRAOPERATIVE TRANSESOPHAGEAL ECHOCARDIOGRAM N/A 11/11/2012   Procedure: INTRAOPERATIVE TRANSESOPHAGEAL ECHOCARDIOGRAM;  Surgeon: Ivin Poot, MD;  Location: [place];  Service: Open Heart Surgery;  Laterality: N/A;  . LEFT HEART CATHETERIZATION WITH CORONARY ANGIOGRAM N/A 11/07/2012   Procedure: LEFT HEART CATHETERIZATION WITH CORONARY ANGIOGRAM;  Surgeon: Thayer Headings, MD;  Location: [facility] CATH LAB;  Service: Cardiovascular;  Laterality: N/A;  . LUMBAR LAMINECTOMY/DECOMPRESSION MICRODISCECTOMY N/A 02/05/2019   Procedure: LUMBAR LAMINECTOMY/DECOMPRESSION L3-L4;  Surgeon: Latanya Maudlin, MD;  [facility];  Service: Orthopedics;  Laterality: N/A;  67min  . LYMPH NODE DISSECTION     Bilateral pelvic  . RETROPUBIC PROSTATECTOMY     Radical    There were no vitals filed for this visit.   Subjective Assessment - 11/17/19 0958    Subjective COVID-19 screening performed upon arrival. Patient reporting he is getting 3rd injection tonight in R knee.    Pertinent History CKD, HTN,  history of stroke, CABG x4, lumbar laminectomy/decompression microdiscectomy 02/05/2019    Limitations House hold activities;Walking;Standing;Sitting    Diagnostic tests x-ray & MRI: unsure of results per patient    Patient Stated Goals decrease pain, walk better    Currently in Pain? Yes    Pain Score 7     Pain Location Knee    Pain Orientation Right    Pain Descriptors / Indicators Aching;Sore    Pain Type Chronic pain    Pain Onset More than a month ago                             [facility] Adult PT  Treatment/Exercise - 11/17/19 0001      Exercises   Exercises Knee/Hip      Lumbar Exercises: Supine   Clam 20 reps;2 seconds    Clam Limitations red theraband    Bridge 15 reps;3 seconds;Limitations      Knee/Hip Exercises: Aerobic   Recumbent Bike L2 x 13 minutes      Knee/Hip Exercises: Machines for Strengthening   Cybex Knee Extension 10# 3 x10 reps    Cybex Knee Flexion 30# 3 x10 reps      Knee/Hip Exercises: Standing   Heel Raises Both;15 reps    Hip Abduction Stengthening;Both;15 reps;Limitations    Abduction Limitations UE support    Forward Step Up Both;20 reps;Hand Hold: 2;Step Height: 6"    Rocker Board 2 minutes      Knee/Hip Exercises: Seated   Clamshell with TheraBand --    Sit to Sand 15 reps;with UE support;without UE support   initially requires UE support                   PT Short Term Goals - 10/28/19 2032      PT SHORT TERM GOAL #1   Title STG=LTG             PT Long Term Goals - 11/17/19 1007      PT LONG TERM GOAL #1   Title Patient will be independent with HEP.    Time 4    Period Weeks    Status On-going      PT LONG TERM GOAL #2   Title Patient will demonstrate 4+/5 bilateral LE MMT to improve stability during functional tasks.    Status On-going      PT LONG TERM GOAL #3   Title Patient will decrease risk of falls as noted by the ability to perfom modified 5x sit to stand with UE support in 18 seconds or less.    Status On-going      PT LONG TERM GOAL #4   Title Patient will report ability to perform ADLs with right knee pain less than or equal to 3/10.    Baseline reporting 7/10 pain on 11/17/2019    Status On-going                 Plan - 11/17/19 1004    Clinical Impression Statement Pt presenting today reporting increased pain of 7/10. Pt stating he is scheduled to have 3rd injection this evening. Pt contiuing to require verbal instructions throughout session for exercise technique. Pt is making progress  with overall strengthening.    Personal Factors and Comorbidities Comorbidity 3+;Age;Time since onset of injury/illness/exacerbation    Comorbidities CKD, HTN, history of stroke, CABG x4, lumbar laminectomy/decompression microdiscectomy 02/05/2019    Examination-Activity Limitations Locomotion Level;Transfers;Stairs;Stand  Stability/Clinical Decision Making Stable/Uncomplicated    Rehab Potential Fair    PT Frequency 2x / week    PT Duration 4 weeks    PT Treatment/Interventions ADLs/Self Care Home Management;Cryotherapy;Electrical Stimulation;Moist Heat;Therapeutic activities;Functional mobility training;Stair training;Gait training;Therapeutic exercise;Balance training;Neuromuscular re-education;Manual techniques;Passive range of motion;Patient/family education;Vasopneumatic Device    PT Next Visit Plan Nustep, pain free strengthening and ROM; hip strengthening, balance activities; modalities PRN for pain relief.    PT Home Exercise Plan see patient education section    Consulted and Agree with Plan of Care Patient           Patient will benefit from skilled therapeutic intervention in order to improve the following deficits and impairments:  Abnormal gait, Decreased activity tolerance, Decreased balance, Decreased mobility, Decreased strength, Increased edema, Pain, Difficulty walking, Decreased range of motion  Visit Diagnosis: Chronic pain of right knee  Localized edema  Difficulty in walking, not elsewhere classified  Unsteadiness on feet     Problem List Patient Active Problem List   Diagnosis Date Noted  . Iron deficiency anemia due to chronic blood loss 05/27/2019  . Nonrheumatic mitral valve regurgitation 05/26/2019  . Left anterior fascicular block 05/26/2019  . Educated about COVID-19 virus infection 04/06/2019  . Drug-induced constipation 02/19/2019  . Elevated troponin 02/09/2019  . Fecal occult blood test positive 02/09/2019  . Spinal stenosis, lumbar region  with neurogenic claudication 02/05/2019  . Leg swelling 01/26/2019  . Nonrheumatic aortic valve stenosis 01/26/2019  . Multiple joint pain 12/11/2018  . Chronic pain of both knees 11/07/2018  . Vitamin D deficiency 10/23/2018  . Gastroesophageal reflux disease with esophagitis 03/13/2018  . Murmur, cardiac 12/04/2017  . Coronary artery disease involving native coronary artery of native heart without angina pectoris 12/04/2017  . PVC's (premature ventricular contractions) 12/04/2017  . Heart disease 08/21/2017  . Fatigue 11/14/2016  . Abnormality of gait 12/06/2015  . Abdominal aortic atherosclerosis ([place]) 12/01/2015  . Degeneration of lumbar intervertebral disc 12/01/2015  . Exertional angina ([place]) 07/20/2015  . Hernia of abdominal wall 10/12/2014  . Gynecomastia 04/09/2013  . Hx of CABG 02/02/2013  . Myelodysplastic syndrome 11/06/2012  . Vitamin B12 deficiency 07/07/2012  . Hyperlipemia 07/07/2012  . Peripheral vascular disease ([place]) 04/14/2009  . CARCINOMA, PROSTATE 04/13/2009  . Non-thrombocytopenic purpura ([place]) 04/13/2009  . Benign essential hypertension 04/13/2009  . Chronic renal insufficiency, stage III (moderate) 04/13/2009  . SLEEP APNEA 04/13/2009    Oretha Caprice, PT, MPT 11/17/2019, 10:31 AM  [facility] [address] [place], [place], [address] Phone: [phone]   Fax:  [phone]  Name: Jon Reid MRN: [id number] Date of Birth: 1929-03-13

## 2019-11-19 ENCOUNTER — Other Ambulatory Visit: Payer: Self-pay

## 2019-11-19 ENCOUNTER — Ambulatory Visit: Payer: MEDICARE | Attending: Orthopedic Surgery | Admitting: Physical Therapy

## 2019-11-19 ENCOUNTER — Encounter: Payer: Self-pay | Admitting: Physical Therapy

## 2019-11-19 DIAGNOSIS — R2681 Unsteadiness on feet: Secondary | ICD-10-CM | POA: Diagnosis not present

## 2019-11-19 DIAGNOSIS — G8929 Other chronic pain: Secondary | ICD-10-CM

## 2019-11-19 DIAGNOSIS — M25561 Pain in right knee: Secondary | ICD-10-CM | POA: Insufficient documentation

## 2019-11-19 DIAGNOSIS — R600 Localized edema: Secondary | ICD-10-CM | POA: Diagnosis not present

## 2019-11-19 DIAGNOSIS — R262 Difficulty in walking, not elsewhere classified: Secondary | ICD-10-CM

## 2019-11-19 NOTE — Therapy (Signed)
[facility] [place], [place], [address] Phone: [phone]   Fax:  [phone]  Physical Therapy Treatment  Patient Details  Name: Jon Reid MRN: [id number] Date of Birth: Sep 02, 1928 Referring Provider (PT): Rod Can, MD   Encounter Date: 11/19/2019   PT End of Session - 11/19/19 0955    Visit Number 5    Number of Visits 8    Date for PT Re-Evaluation 12/02/19    Authorization Type Progress note after 10th visit    PT Start Time 0945    PT Stop Time 1030    PT Time Calculation (min) 45 min    Activity Tolerance Patient tolerated treatment well    Behavior During Therapy [facility] for tasks assessed/performed           Past Medical History:  Diagnosis Date  . Arthritis   . Carcinoma of prostate [facility])    prostate  . CKD (chronic kidney disease) stage 2, GFR 60-89 ml/min   . Coronary artery disease    a. LHC (8/14):  3v CAD => s/p CABG (LIMA-LAD, SVG-OM1, SVG-PDA)  . First degree AV block 01/28/2019   Noted on EKG  . GERD (gastroesophageal reflux disease)   . Gout   . HTN (hypertension)   . Hx of echocardiogram    Echocardiogram 11/13/12: EF 55-60%, normal wall motion, biatrial enlargement, mild AI, mild TR, no pericardial effusion  . Macrocytosis without anemia 01/08/2012  . Moderate aortic stenosis 12/06/2017   Noted on ECHO  . 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 Sleep apnea    Questionable  . Spinal stenosis   . Stroke [facility])     Past Surgical History:  Procedure Laterality Date  . CARDIAC CATHETERIZATION  11/07/2012   Dr Acie Fredrickson  . CATARACT EXTRACTION W/ INTRAOCULAR LENS IMPLANT Bilateral   . CORONARY ARTERY BYPASS GRAFT N/A 11/11/2012   Procedure: CORONARY ARTERY BYPASS GRAFTING times four using Right Greater Saphenous Vein Graft harvested endoscopically and Left Internal Mammary Artery.;  Surgeon: Ivin Poot, MD;  Location: [place];  Service:  Open Heart Surgery;  Laterality: N/A;  . INGUINAL HERNIA REPAIR Right 01/10/2017   Procedure: OPEN RIGHT HERNIA REPAIR INGUINAL;  Surgeon: Ileana Roup, MD;  Location: [facility];  Service: General;  Laterality: Right;  . INSERTION OF MESH Right 01/10/2017   Procedure: INSERTION OF MESH;  Surgeon: Ileana Roup, MD;  [facility];  Service: General;  Laterality: Right;  . INTRAOPERATIVE TRANSESOPHAGEAL ECHOCARDIOGRAM N/A 11/11/2012   Procedure: INTRAOPERATIVE TRANSESOPHAGEAL ECHOCARDIOGRAM;  Surgeon: Ivin Poot, MD;  Location: [place];  Service: Open Heart Surgery;  Laterality: N/A;  . LEFT HEART CATHETERIZATION WITH CORONARY ANGIOGRAM N/A 11/07/2012   Procedure: LEFT HEART CATHETERIZATION WITH CORONARY ANGIOGRAM;  Surgeon: Thayer Headings, MD;  Location: [facility] CATH LAB;  Service: Cardiovascular;  Laterality: N/A;  . LUMBAR LAMINECTOMY/DECOMPRESSION MICRODISCECTOMY N/A 02/05/2019   Procedure: LUMBAR LAMINECTOMY/DECOMPRESSION L3-L4;  Surgeon: Latanya Maudlin, MD;  Location[facility];  Service: Orthopedics;  Laterality: N/A;  43min  . LYMPH NODE DISSECTION     Bilateral pelvic  . RETROPUBIC PROSTATECTOMY     Radical    There were no vitals filed for this visit.   Subjective Assessment - 11/19/19 0952    Subjective COVID-19 screening performed upon arrival. Patient reporting he received his 3rd injection in R knee and it's been sore.    Pertinent  History CKD, HTN, history of stroke, CABG x4, lumbar laminectomy/decompression microdiscectomy 02/05/2019    Limitations House hold activities;Walking;Standing;Sitting    Diagnostic tests x-ray & MRI: unsure of results per patient    Patient Stated Goals decrease pain, walk better    Currently in Pain? No/denies                             [facility] Adult PT Treatment/Exercise - 11/19/19 0001      Exercises   Exercises Knee/Hip      Knee/Hip Exercises: Aerobic   Recumbent Bike L2 x 10 minutes      Knee/Hip  Exercises: Machines for Strengthening   Cybex Knee Extension 10# 3 x10 reps    Cybex Knee Flexion 30# 3 x10 reps      Knee/Hip Exercises: Standing   Heel Raises Both;15 reps    Hip Abduction Stengthening;Both;15 reps;Limitations    Abduction Limitations UE support    Forward Step Up Both;20 reps;Hand Hold: 2;Step Height: 6"    Rocker Board 2 minutes    Rocker Board Limitations df/pf and pronation/supination    SLS SLS on level surfaces with intermittent UE support      Knee/Hip Exercises: Seated   Sit to Sand 5 reps   5 time sit to stand 14.2 seconds UE support                   PT Short Term Goals - 10/28/19 2032      PT SHORT TERM GOAL #1   Title STG=LTG             PT Long Term Goals - 11/19/19 0957      PT LONG TERM GOAL #1   Title Patient will be independent with HEP.    Status On-going      PT LONG TERM GOAL #2   Title Patient will demonstrate 4+/5 bilateral LE MMT to improve stability during functional tasks.    Status On-going      PT LONG TERM GOAL #3   Title Patient will decrease risk of falls as noted by the ability to perfom modified 5x sit to stand with UE support in 18 seconds or less.    Baseline 14.3 seconds using UE support on 11/19/2019    Status Achieved      PT LONG TERM GOAL #4   Title Patient will report ability to perform ADLs with right knee pain less than or equal to 3/10.    Baseline reporting 7/10 pain on 11/17/2019    Status On-going                 Plan - 11/19/19 0956    Clinical Impression Statement Pt tolerating exercises well today. Pt reporting no pain at beginning of session after having his 3rd injection yesterday. Pt progressing with LE strengthening and overall functional mobility. Pt still reporting increased pain when he first wakes up in the morning and stiffness. Continue skilled PT.    Personal Factors and Comorbidities Comorbidity 3+;Age;Time since onset of injury/illness/exacerbation    Comorbidities CKD,  HTN, history of stroke, CABG x4, lumbar laminectomy/decompression microdiscectomy 02/05/2019    Examination-Activity Limitations Locomotion Level;Transfers;Stairs;Stand    Stability/Clinical Decision Making Stable/Uncomplicated    Rehab Potential Fair    PT Frequency 2x / week    PT Duration 4 weeks    PT Treatment/Interventions ADLs/Self Care Home Management;Cryotherapy;Electrical Stimulation;Moist Heat;Therapeutic activities;Functional mobility training;Stair training;Gait training;Therapeutic exercise;Balance training;Neuromuscular  re-education;Manual techniques;Passive range of motion;Patient/family education;Vasopneumatic Device    PT Next Visit Plan Bike, pain free strengthening and ROM; hip strengthening, balance activities; modalities PRN for pain relief.    PT Home Exercise Plan see patient education section    Consulted and Agree with Plan of Care Patient           Patient will benefit from skilled therapeutic intervention in order to improve the following deficits and impairments:  Abnormal gait, Decreased activity tolerance, Decreased balance, Decreased mobility, Decreased strength, Increased edema, Pain, Difficulty walking, Decreased range of motion  Visit Diagnosis: Chronic pain of right knee  Localized edema  Difficulty in walking, not elsewhere classified  Unsteadiness on feet     Problem List Patient Active Problem List   Diagnosis Date Noted  . Iron deficiency anemia due to chronic blood loss 05/27/2019  . Nonrheumatic mitral valve regurgitation 05/26/2019  . Left anterior fascicular block 05/26/2019  . Educated about COVID-19 virus infection 04/06/2019  . Drug-induced constipation 02/19/2019  . Elevated troponin 02/09/2019  . Fecal occult blood test positive 02/09/2019  . Spinal stenosis, lumbar region with neurogenic claudication 02/05/2019  . Leg swelling 01/26/2019  . Nonrheumatic aortic valve stenosis 01/26/2019  . Multiple joint pain 12/11/2018  .  Chronic pain of both knees 11/07/2018  . Vitamin D deficiency 10/23/2018  . Gastroesophageal reflux disease with esophagitis 03/13/2018  . Murmur, cardiac 12/04/2017  . Coronary artery disease involving native coronary artery of native heart without angina pectoris 12/04/2017  . PVC's (premature ventricular contractions) 12/04/2017  . Heart disease 08/21/2017  . Fatigue 11/14/2016  . Abnormality of gait 12/06/2015  . Abdominal aortic atherosclerosis ([place]) 12/01/2015  . Degeneration of lumbar intervertebral disc 12/01/2015  . Exertional angina ([place]) 07/20/2015  . Hernia of abdominal wall 10/12/2014  . Gynecomastia 04/09/2013  . Hx of CABG 02/02/2013  . Myelodysplastic syndrome 11/06/2012  . Vitamin B12 deficiency 07/07/2012  . Hyperlipemia 07/07/2012  . Peripheral vascular disease ([place]) 04/14/2009  . CARCINOMA, PROSTATE 04/13/2009  . Non-thrombocytopenic purpura ([place]) 04/13/2009  . Benign essential hypertension 04/13/2009  . Chronic renal insufficiency, stage III (moderate) 04/13/2009  . SLEEP APNEA 04/13/2009    Oretha Caprice, PT, MPT 11/19/2019, 10:06 AM  [facility] [address] [place], [place], [address] Phone: [phone]   Fax:  [phone]  Name: Jon Reid MRN: [id number] Date of Birth: 06/24/1928

## 2019-11-25 ENCOUNTER — Ambulatory Visit: Payer: MEDICARE | Admitting: Physical Therapy

## 2019-11-25 ENCOUNTER — Other Ambulatory Visit: Payer: Self-pay

## 2019-11-25 DIAGNOSIS — R2681 Unsteadiness on feet: Secondary | ICD-10-CM

## 2019-11-25 DIAGNOSIS — G8929 Other chronic pain: Secondary | ICD-10-CM

## 2019-11-25 DIAGNOSIS — R262 Difficulty in walking, not elsewhere classified: Secondary | ICD-10-CM

## 2019-11-25 DIAGNOSIS — M25561 Pain in right knee: Secondary | ICD-10-CM | POA: Diagnosis not present

## 2019-11-25 DIAGNOSIS — R600 Localized edema: Secondary | ICD-10-CM

## 2019-11-25 NOTE — Therapy (Signed)
[facility] [place], [place], [address] Phone: [phone]   Fax:  [phone]  Physical Therapy Treatment  Patient Details  Name: Jon Reid MRN: [id number] Date of Birth: 10/02/1928 Referring Provider (PT): Rod Can, MD   Encounter Date: 11/25/2019   PT End of Session - 11/25/19 1010    Visit Number 6    Number of Visits 8    Date for PT Re-Evaluation 12/02/19    Authorization Type Progress note after 10th visit    PT Start Time 0946    PT Stop Time 1027    PT Time Calculation (min) 41 min    Activity Tolerance Patient tolerated treatment well   limited with weakness today   Behavior During Therapy [facility] for tasks assessed/performed           Past Medical History:  Diagnosis Date  . Arthritis   . Carcinoma of prostate [facility])    prostate  . CKD (chronic kidney disease) stage 2, GFR 60-89 ml/min   . Coronary artery disease    a. LHC (8/14):  3v CAD => s/Reid CABG (LIMA-LAD, SVG-OM1, SVG-PDA)  . First degree AV block 01/28/2019   Noted on EKG  . GERD (gastroesophageal reflux disease)   . Gout   . HTN (hypertension)   . Hx of echocardiogram    Echocardiogram 11/13/12: EF 55-60%, normal wall motion, biatrial enlargement, mild AI, mild TR, no pericardial effusion  . Macrocytosis without anemia 01/08/2012  . Moderate aortic stenosis 12/06/2017   Noted on ECHO  . 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 S/Reid CABG x 4 02/02/2013  . Sleep apnea    Questionable  . Spinal stenosis   . Stroke [facility])     Past Surgical History:  Procedure Laterality Date  . CARDIAC CATHETERIZATION  11/07/2012   Dr Acie Fredrickson  . CATARACT EXTRACTION W/ INTRAOCULAR LENS IMPLANT Bilateral   . CORONARY ARTERY BYPASS GRAFT N/A 11/11/2012   Procedure: CORONARY ARTERY BYPASS GRAFTING times four using Right Greater Saphenous Vein Graft harvested endoscopically and Left Internal Mammary Artery.;  Surgeon: Ivin Poot, MD;   Location: [place];  Service: Open Heart Surgery;  Laterality: N/A;  . INGUINAL HERNIA REPAIR Right 01/10/2017   Procedure: OPEN RIGHT HERNIA REPAIR INGUINAL;  Surgeon: Ileana Roup, MD;  [facility];  Service: General;  Laterality: Right;  . INSERTION OF MESH Right 01/10/2017   Procedure: INSERTION OF MESH;  Surgeon: Ileana Roup, MD;  [facility];  Service: General;  Laterality: Right;  . INTRAOPERATIVE TRANSESOPHAGEAL ECHOCARDIOGRAM N/A 11/11/2012   Procedure: INTRAOPERATIVE TRANSESOPHAGEAL ECHOCARDIOGRAM;  Surgeon: Ivin Poot, MD;  Location: [place];  Service: Open Heart Surgery;  Laterality: N/A;  . LEFT HEART CATHETERIZATION WITH CORONARY ANGIOGRAM N/A 11/07/2012   Procedure: LEFT HEART CATHETERIZATION WITH CORONARY ANGIOGRAM;  Surgeon: Thayer Headings, MD;  Location: [facility] CATH LAB;  Service: Cardiovascular;  Laterality: N/A;  . LUMBAR LAMINECTOMY/DECOMPRESSION MICRODISCECTOMY N/A 02/05/2019   Procedure: LUMBAR LAMINECTOMY/DECOMPRESSION L3-L4;  Surgeon: Latanya Maudlin, MD;  [facility];  Service: Orthopedics;  Laterality: N/A;  34mn  . LYMPH NODE DISSECTION     Bilateral pelvic  . RETROPUBIC PROSTATECTOMY     Radical    There were no vitals filed for this visit.   Subjective Assessment - 11/25/19 0957    Subjective COVID-19 screening performed upon arrival. Patient reporting feeling weak today and yesterday    Pertinent History CKD,  HTN, history of stroke, CABG x4, lumbar laminectomy/decompression microdiscectomy 02/05/2019    Limitations House hold activities;Walking;Standing;Sitting    Diagnostic tests x-ray & MRI: unsure of results per patient    Patient Stated Goals decrease pain, walk better    Currently in Pain? No/denies                             [facility] Adult PT Treatment/Exercise - 11/25/19 0001      Knee/Hip Exercises: Aerobic   Recumbent Bike L3 x10 UE/LE, monitored      Knee/Hip Exercises: Machines for Strengthening     Cybex Knee Extension 10# 3 x10 reps    Cybex Knee Flexion 30# 3 x10 reps      Knee/Hip Exercises: Standing   Heel Raises Both;15 reps    Rocker Board 2 minutes    Other Standing Knee Exercises balance on airex x57mn    Other Standing Knee Exercises side stepping 2 sets in parallel bars      Knee/Hip Exercises: Seated   Clamshell with TheraBand Red   x30   Marching Strengthening;Both;10 reps;3 sets    Marching Limitations red t-band    Sit to Sand 10 reps;with UE support                    PT Short Term Goals - 10/28/19 2032      PT SHORT TERM GOAL #1   Title STG=LTG             PT Long Term Goals - 11/25/19 0957      PT LONG TERM GOAL #1   Title Patient will be independent with HEP.    Time 4    Period Weeks    Status On-going      PT LONG TERM GOAL #2   Title Patient will demonstrate 4+/5 bilateral LE MMT to improve stability during functional tasks.    Time 4    Period Weeks    Status On-going      PT LONG TERM GOAL #3   Title Patient will decrease risk of falls as noted by the ability to perfom modified 5x sit to stand with UE support in 18 seconds or less.    Baseline 14.3 seconds using UE support on 11/19/2019    Time 4    Period Weeks    Status Achieved      PT LONG TERM GOAL #4   Title Patient will report ability to perform ADLs with right knee pain less than or equal to 3/10.    Baseline no pain reported since shot in knee per reported, less ADL's due to weakness/balance deficts 11/25/19    Time 4    Period Weeks    Status Partially Met                 Plan - 11/25/19 1011    Clinical Impression Statement Patient tolerated treatment fair due to weakness reported today and yesterday for unknown reason. Patient has reported no knee pain yet less ADL's due to weakness and balance. Today focused on more seated exercises and less standing due to weakness. Patient is not using an assistive device yet was educated on use for safety due to  balance and LOB notived today. Patient goals are progressing.    Personal Factors and Comorbidities Comorbidity 3+;Age;Time since onset of injury/illness/exacerbation    Comorbidities CKD, HTN, history of stroke, CABG x4, lumbar laminectomy/decompression microdiscectomy 02/05/2019  Examination-Activity Limitations Locomotion Level;Transfers;Stairs;Stand    Stability/Clinical Decision Making Stable/Uncomplicated    Rehab Potential Fair    PT Frequency 2x / week    PT Duration 4 weeks    PT Treatment/Interventions ADLs/Self Care Home Management;Cryotherapy;Electrical Stimulation;Moist Heat;Therapeutic activities;Functional mobility training;Stair training;Gait training;Therapeutic exercise;Balance training;Neuromuscular re-education;Manual techniques;Passive range of motion;Patient/family education;Vasopneumatic Device    PT Next Visit Plan cont with POC for pain free strengthening and ROM; hip strengthening, balance activities; modalities PRN for pain relief.    Consulted and Agree with Plan of Care Patient           Patient will benefit from skilled therapeutic intervention in order to improve the following deficits and impairments:  Abnormal gait, Decreased activity tolerance, Decreased balance, Decreased mobility, Decreased strength, Increased edema, Pain, Difficulty walking, Decreased range of motion  Visit Diagnosis: Chronic pain of right knee  Localized edema  Difficulty in walking, not elsewhere classified  Unsteadiness on feet     Problem List Patient Active Problem List   Diagnosis Date Noted  . Iron deficiency anemia due to chronic blood loss 05/27/2019  . Nonrheumatic mitral valve regurgitation 05/26/2019  . Left anterior fascicular block 05/26/2019  . Educated about COVID-19 virus infection 04/06/2019  . Drug-induced constipation 02/19/2019  . Elevated troponin 02/09/2019  . Fecal occult blood test positive 02/09/2019  . Spinal stenosis, lumbar region with  neurogenic claudication 02/05/2019  . Leg swelling 01/26/2019  . Nonrheumatic aortic valve stenosis 01/26/2019  . Multiple joint pain 12/11/2018  . Chronic pain of both knees 11/07/2018  . Vitamin D deficiency 10/23/2018  . Gastroesophageal reflux disease with esophagitis 03/13/2018  . Murmur, cardiac 12/04/2017  . Coronary artery disease involving native coronary artery of native heart without angina pectoris 12/04/2017  . PVC's (premature ventricular contractions) 12/04/2017  . Heart disease 08/21/2017  . Fatigue 11/14/2016  . Abnormality of gait 12/06/2015  . Abdominal aortic atherosclerosis ([place]) 12/01/2015  . Degeneration of lumbar intervertebral disc 12/01/2015  . Exertional angina ([place]) 07/20/2015  . Hernia of abdominal wall 10/12/2014  . Gynecomastia 04/09/2013  . Hx of CABG 02/02/2013  . Myelodysplastic syndrome 11/06/2012  . Vitamin B12 deficiency 07/07/2012  . Hyperlipemia 07/07/2012  . Peripheral vascular disease ([place]) 04/14/2009  . CARCINOMA, PROSTATE 04/13/2009  . Non-thrombocytopenic purpura ([place]) 04/13/2009  . Benign essential hypertension 04/13/2009  . Chronic renal insufficiency, stage III (moderate) 04/13/2009  . SLEEP APNEA 04/13/2009    Jon Reid, PTA 11/25/2019, 10:29 AM  [facility] [place], [place], [address] Phone: [phone]   Fax:  [phone]  Name: Jon Reid MRN: [id number] Date of Birth: 22-Jan-1929

## 2019-11-26 ENCOUNTER — Ambulatory Visit (INDEPENDENT_AMBULATORY_CARE_PROVIDER_SITE_OTHER): Payer: MEDICARE | Admitting: Family Medicine

## 2019-11-26 ENCOUNTER — Encounter: Payer: Self-pay | Admitting: Family Medicine

## 2019-11-26 VITALS — BP 138/83 | HR 78 | Temp 98.1°F | Ht 71.0 in | Wt 150.0 lb

## 2019-11-26 DIAGNOSIS — M48061 Spinal stenosis, lumbar region without neurogenic claudication: Secondary | ICD-10-CM

## 2019-11-26 DIAGNOSIS — I2510 Atherosclerotic heart disease of native coronary artery without angina pectoris: Secondary | ICD-10-CM | POA: Diagnosis not present

## 2019-11-26 DIAGNOSIS — R634 Abnormal weight loss: Secondary | ICD-10-CM | POA: Diagnosis not present

## 2019-11-26 DIAGNOSIS — F482 Pseudobulbar affect: Secondary | ICD-10-CM

## 2019-11-26 NOTE — Progress Notes (Signed)
Subjective:  Patient ID: Jon Reid, male    DOB: 1928-10-15  Age: 86 y.o. MRN: [id number]  CC: Follow-up (1 month)   HPI AMANTE FOMBY presents for history of CVA last year.  He says he has unexplained crying spells.  He does not feel sad he does not have any emotion but he will just start crying uncontrollably without any conscious emotion to do so.  Patient's been noted to have a hiatal hernia.  He has some shortness of breath and cough with concern about a internal lesion.  He continues to have right leg pain.  He has had 3 shots in the right knee 1/week for 3 weeks and says that it works better than cortisone.  Depression screen [facility] 2/9 11/26/2019 10/06/2019 09/25/2019  Decreased Interest 0 1 1  Down, Depressed, Hopeless 0 0 0  PHQ - 2 Score 0 1 1  Altered sleeping - - -  Tired, decreased energy - - -  Change in appetite - - -  Feeling bad or failure about yourself  - - -  Trouble concentrating - - -  Moving slowly or fidgety/restless - - -  Suicidal thoughts - - -  PHQ-9 Score - - -  Some recent data might be hidden    History Tin has a past medical history of Arthritis, Carcinoma of prostate ([place]), CKD (chronic kidney disease) stage 2, GFR 60-89 ml/min, Coronary artery disease, First degree AV block (01/28/2019), GERD (gastroesophageal reflux disease), Gout, HTN (hypertension), echocardiogram, Macrocytosis without anemia (01/08/2012), Moderate aortic stenosis (12/06/2017), Right BBB/left ant fasc block (01/28/2019), S/P CABG x 4 (02/02/2013), Sleep apnea, Spinal stenosis, and Stroke ([place]).   He has a past surgical history that includes Retropubic prostatectomy; Lymph node dissection; Cardiac catheterization (11/07/2012); Coronary artery bypass graft (N/A, 11/11/2012); Intraoprative transesophageal echocardiogram (N/A, 11/11/2012); left heart catheterization with coronary angiogram (N/A, 11/07/2012); Inguinal hernia repair (Right, 01/10/2017); Insertion of mesh (Right,  01/10/2017); Cataract extraction w/ intraocular lens implant (Bilateral); and Lumbar laminectomy/decompression microdiscectomy (N/A, 02/05/2019).   His family history includes Aneurysm in his father; Cancer in his mother; Hypertension in his father; Parkinson's disease in his son; Stroke in his father.He reports that he has never smoked. He has never used smokeless tobacco. He reports current alcohol use. He reports that he does not use drugs.    ROS Review of Systems  Constitutional: Negative for fever.  Respiratory: Negative for shortness of breath.   Cardiovascular: Negative for chest pain.  Musculoskeletal: Positive for arthralgias (right knee).  Skin: Negative for rash.  Neurological: Positive for tremors.  Psychiatric/Behavioral: Agitation: cries uncontrollably and randomly since stroke last year.    Objective:  BP 138/83   Pulse 78   Temp 98.1 F (36.7 C) (Temporal)   Ht 5\' 11"  (1.803 m)   Wt 150 lb (68 kg)   BMI 20.92 kg/m   BP Readings from Last 3 Encounters:  11/26/19 138/83  10/06/19 138/70  09/25/19 135/72    Wt Readings from Last 3 Encounters:  11/26/19 150 lb (68 kg)  10/06/19 147 lb 6 oz (66.8 kg)  09/25/19 150 lb (68 kg)     Physical Exam Vitals reviewed.  Constitutional:      Appearance: He is well-developed.  HENT:     Head: Normocephalic and atraumatic.     Right Ear: External ear normal.     Left Ear: External ear normal.     Mouth/Throat:     Pharynx: No oropharyngeal exudate or posterior oropharyngeal  erythema.  Eyes:     Pupils: Pupils are equal, round, and reactive to light.  Cardiovascular:     Rate and Rhythm: Normal rate and regular rhythm.     Heart sounds: No murmur heard.   Pulmonary:     Effort: No respiratory distress.     Breath sounds: Normal breath sounds.  Musculoskeletal:        General: Deformity (stooped posture) present.     Cervical back: Normal range of motion and neck supple.  Skin:    General: Skin is warm and  dry.  Neurological:     Mental Status: He is alert and oriented to person, place, and time.     Motor: Weakness (RUE, with tremor, plus generalized weakness) present.       Assessment & Plan:   Drayden was seen today for follow-up.  Diagnoses and all orders for this visit:  [name] affect -     Ambulatory referral to Neurology  Spinal stenosis of lumbar region, unspecified whether neurogenic claudication present -     Ambulatory referral to Neurology  Weight loss, unintentional -     Ambulatory referral to Neurology -     CT Chest W Contrast; Future       I am having Barnabas Lister A. Amano maintain his fluticasone, nitroGLYCERIN, aspirin EC, Vitamin D, triamcinolone cream, diclofenac sodium, naproxen, furosemide, Iron (Ferrous Sulfate), pantoprazole, and doxycycline.  Allergies as of 11/26/2019      Reactions   Crestor [rosuvastatin]    GYNECOMASTIA AND SWELLING      Medication List       Accurate as of November 26, 2019 11:59 PM. If you have any questions, ask your nurse or doctor.        aspirin EC 81 MG tablet Take 81 mg by mouth daily.   diclofenac sodium 1 % Gel Commonly known as: VOLTAREN Apply 2 g topically 4 (four) times daily.   doxycycline 100 MG capsule Commonly known as: Vibramycin Take 1 capsule (100 mg total) by mouth 2 (two) times daily.   fluticasone 50 MCG/ACT nasal spray Commonly known as: FLONASE One to 2 sprays each nostril at bedtime   furosemide 40 MG tablet Commonly known as: LASIX Take 1 tablet (40 mg total) by mouth daily.   Iron (Ferrous Sulfate) 325 (65 Fe) MG Tabs Take 1 tablet by mouth every Monday, Wednesday, and Friday.   naproxen 500 MG tablet Commonly known as: Naprosyn Take 1 tablet (500 mg total) by mouth 2 (two) times daily with a meal.   nitroGLYCERIN 0.4 MG SL tablet Commonly known as: NITROSTAT Place 1 tablet (0.4 mg total) under the tongue every 5 (five) minutes as needed for chest pain.   pantoprazole 40 MG  tablet Commonly known as: PROTONIX TAKE 1 TABLET ONCE DAILY FOR REFLUX   triamcinolone cream 0.1 % Commonly known as: KENALOG Apply 1 application topically 2 (two) times daily. What changed:   when to take this  reasons to take this   Vitamin D 50 MCG (2000 UT) Caps Take 2,000 Units by mouth daily.        Follow-up: Return in about 3 months (around 02/25/2020).  Claretta Fraise, M.D.

## 2019-12-01 ENCOUNTER — Ambulatory Visit: Payer: MEDICARE | Admitting: Physical Therapy

## 2019-12-01 ENCOUNTER — Other Ambulatory Visit: Payer: Self-pay

## 2019-12-01 ENCOUNTER — Telehealth: Payer: Self-pay | Admitting: Family Medicine

## 2019-12-01 ENCOUNTER — Encounter: Payer: Self-pay | Admitting: Physical Therapy

## 2019-12-01 DIAGNOSIS — R262 Difficulty in walking, not elsewhere classified: Secondary | ICD-10-CM

## 2019-12-01 DIAGNOSIS — R2681 Unsteadiness on feet: Secondary | ICD-10-CM

## 2019-12-01 DIAGNOSIS — G8929 Other chronic pain: Secondary | ICD-10-CM

## 2019-12-01 DIAGNOSIS — M25561 Pain in right knee: Secondary | ICD-10-CM | POA: Diagnosis not present

## 2019-12-01 DIAGNOSIS — R600 Localized edema: Secondary | ICD-10-CM

## 2019-12-01 NOTE — Therapy (Addendum)
Jon [place], [place], [address] Phone: [phone]   Fax:  [phone]  Physical Therapy Treatment  PHYSICAL THERAPY DISCHARGE SUMMARY  Visits from Start of Care: 8  Current functional level related to goals / functional outcomes: See below   Remaining deficits: Goals partially met   Education / Equipment: HEP Plan: Patient agrees to discharge.  Patient goals were partially met. Patient is being discharged due to lack of progress.  ?????     Patient Details  Name: Jon Reid MRN: [id number] Date of Birth: 04/29/1928 Referring Provider (PT): Rod Can, MD   Encounter Date: 12/01/2019   PT End of Session - 12/01/19 0951    Visit Number 8   Error in count   Number of Visits 8    Date for PT Re-Evaluation 12/02/19    Authorization Type Progress note after 10th visit    PT Start Time 0945    PT Stop Time 1029    PT Time Calculation (min) 44 min    Activity Tolerance Patient tolerated treatment well    Behavior During Therapy [facility] for tasks assessed/performed           Past Medical History:  Diagnosis Date  . Arthritis   . Carcinoma of prostate [facility])    prostate  . CKD (chronic kidney disease) stage 2, GFR 60-89 ml/min   . Coronary artery disease    a. LHC (8/14):  3v CAD => s/p CABG (LIMA-LAD, SVG-OM1, SVG-PDA)  . First degree AV block 01/28/2019   Noted on EKG  . GERD (gastroesophageal reflux disease)   . Gout   . HTN (hypertension)   . Hx of echocardiogram    Echocardiogram 11/13/12: EF 55-60%, normal wall motion, biatrial enlargement, mild AI, mild TR, no pericardial effusion  . Macrocytosis without anemia 01/08/2012  . Moderate aortic stenosis 12/06/2017   Noted on ECHO  . 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 Sleep apnea    Questionable  . Spinal stenosis   . Stroke [facility])     Past Surgical History:  Procedure Laterality Date    . CARDIAC CATHETERIZATION  11/07/2012   Dr Acie Fredrickson  . CATARACT EXTRACTION W/ INTRAOCULAR LENS IMPLANT Bilateral   . CORONARY ARTERY BYPASS GRAFT N/A 11/11/2012   Procedure: CORONARY ARTERY BYPASS GRAFTING times four using Right Greater Saphenous Vein Graft harvested endoscopically and Left Internal Mammary Artery.;  Surgeon: Ivin Poot, MD;  Location: [place];  Service: Open Heart Surgery;  Laterality: N/A;  . INGUINAL HERNIA REPAIR Right 01/10/2017   Procedure: OPEN RIGHT HERNIA REPAIR INGUINAL;  Surgeon: Ileana Roup, MD;  Location: [facility];  Service: General;  Laterality: Right;  . INSERTION OF MESH Right 01/10/2017   Procedure: INSERTION OF MESH;  Surgeon: Ileana Roup, MD;  Location: [facility];  Service: General;  Laterality: Right;  . INTRAOPERATIVE TRANSESOPHAGEAL ECHOCARDIOGRAM N/A 11/11/2012   Procedure: INTRAOPERATIVE TRANSESOPHAGEAL ECHOCARDIOGRAM;  Surgeon: Ivin Poot, MD;  Location: [place];  Service: Open Heart Surgery;  Laterality: N/A;  . LEFT HEART CATHETERIZATION WITH CORONARY ANGIOGRAM N/A 11/07/2012   Procedure: LEFT HEART CATHETERIZATION WITH CORONARY ANGIOGRAM;  Surgeon: Thayer Headings, MD;  Location: [facility] CATH LAB;  Service: Cardiovascular;  Laterality: N/A;  . LUMBAR LAMINECTOMY/DECOMPRESSION MICRODISCECTOMY N/A 02/05/2019   Procedure: LUMBAR LAMINECTOMY/DECOMPRESSION L3-L4;  Surgeon: Latanya Maudlin, MD;  Location: [facility];  Service: Orthopedics;  Laterality: N/A;  68mn  . LYMPH NODE DISSECTION     Bilateral pelvic  . RETROPUBIC PROSTATECTOMY     Radical    There were no vitals filed for this visit.   Subjective Assessment - 12/01/19 0950    Subjective COVID-19 screening performed upon arrival. Patient reports feeling about the same. No change in pain since start of therapy.    Pertinent History CKD, HTN, history of stroke, CABG x4, lumbar laminectomy/decompression microdiscectomy 02/05/2019    Limitations House hold  activities;Walking;Standing;Sitting    Diagnostic tests x-ray & MRI: unsure of results per patient    Patient Stated Goals decrease pain, walk better    Currently in Pain? No/denies   but can increase to 8 with pressure             [facility] PT Assessment - 12/01/19 0001      Assessment   Medical Diagnosis Patellofemoral syndrome of right knee    Referring Provider (PT) BRod Can MD    Next MD Visit 11/10/2019    Prior Therapy no      Precautions   Precautions Fall      Strength   Right Hip Flexion 4-/5    Left Hip Flexion 4-/5    Right Knee Flexion 4/5    Right Knee Extension 4/5    Left Knee Flexion 4/5    Left Knee Extension 4+/5                         [facility] Adult PT Treatment/Exercise - 12/01/19 0001      Exercises   Exercises Knee/Hip      Knee/Hip Exercises: Aerobic   Recumbent Bike L4 x10 UE/LE, monitored      Knee/Hip Exercises: Machines for Strengthening   Cybex Knee Extension 10# 3 x10 reps    Cybex Knee Flexion 30# 3 x10 reps      Knee/Hip Exercises: Standing   Heel Raises Both;15 reps    Heel Raises Limitations toe raises x15    Hip Flexion Stengthening;Both;2 sets;10 reps;Knee bent    Hip Flexion Limitations 2#    Hip Abduction Stengthening;Both;2 sets;10 reps;Knee straight    Abduction Limitations 2#    Forward Step Up Both;20 reps;Hand Hold: 2;Step Height: 6"    Rocker Board 2 minutes    Other Standing Knee Exercises side stepping on balance beam x2 minutes                    PT Short Term Goals - 10/28/19 2032      PT SHORT TERM GOAL #1   Title STG=LTG             PT Long Term Goals - 12/01/19 1310      PT LONG TERM GOAL #1   Title Patient will be independent with HEP.    Time 4    Period Weeks    Status Partially Met      PT LONG TERM GOAL #2   Title Patient will demonstrate 4+/5 bilateral LE MMT to improve stability during functional tasks.    Time 4    Period Weeks    Status Not Met      PT  LONG TERM GOAL #3   Title Patient will decrease risk of falls as noted by the ability to perfom modified 5x sit to stand with UE support in 18 seconds or less.    Baseline 14.3 seconds using UE support on 11/19/2019  Time 4    Period Weeks    Status Achieved      PT LONG TERM GOAL #4   Title Patient will report ability to perform ADLs with right knee pain less than or equal to 3/10.    Baseline no pain reported since shot in knee per reported, less ADL's due to weakness/balance deficts 11/25/19    Time 4    Period Weeks    Status Partially Met                 Plan - 12/01/19 1300    Clinical Impression Statement Patient was able to tolerate treatment fairly well but did require constant cuing for form and technique. Patient and PT discussed DC today due to lack of progress and minimal goals met. Patient instructed to continue HEP at home and also discussed scanning environment while ambulating to avoid tripping hazards. Patient reported understanding.    Personal Factors and Comorbidities Comorbidity 3+;Age;Time since onset of injury/illness/exacerbation    Comorbidities CKD, HTN, history of stroke, CABG x4, lumbar laminectomy/decompression microdiscectomy 02/05/2019    Examination-Activity Limitations Locomotion Level;Transfers;Stairs;Stand    Stability/Clinical Decision Making Stable/Uncomplicated    Clinical Decision Making Low    Rehab Potential Fair    PT Frequency 2x / week    PT Duration 4 weeks    PT Treatment/Interventions ADLs/Self Care Home Management;Cryotherapy;Electrical Stimulation;Moist Heat;Therapeutic activities;Functional mobility training;Stair training;Gait training;Therapeutic exercise;Balance training;Neuromuscular re-education;Manual techniques;Passive range of motion;Patient/family education;Vasopneumatic Device    PT Next Visit Plan cont with POC for pain free strengthening and ROM; hip strengthening, balance activities; modalities PRN for pain relief.    PT  Home Exercise Plan see patient education section    Consulted and Agree with Plan of Care Patient           Patient will benefit from skilled therapeutic intervention in order to improve the following deficits and impairments:  Abnormal gait, Decreased activity tolerance, Decreased balance, Decreased mobility, Decreased strength, Increased edema, Pain, Difficulty walking, Decreased range of motion  Visit Diagnosis: Chronic pain of right knee  Localized edema  Difficulty in walking, not elsewhere classified  Unsteadiness on feet     Problem List Patient Active Problem List   Diagnosis Date Noted  . Iron deficiency anemia due to chronic blood loss 05/27/2019  . Nonrheumatic mitral valve regurgitation 05/26/2019  . Left anterior fascicular block 05/26/2019  . Educated about COVID-19 virus infection 04/06/2019  . Drug-induced constipation 02/19/2019  . Elevated troponin 02/09/2019  . Fecal occult blood test positive 02/09/2019  . Spinal stenosis, lumbar region with neurogenic claudication 02/05/2019  . Leg swelling 01/26/2019  . Nonrheumatic aortic valve stenosis 01/26/2019  . Multiple joint pain 12/11/2018  . Chronic pain of both knees 11/07/2018  . Vitamin D deficiency 10/23/2018  . Gastroesophageal reflux disease with esophagitis 03/13/2018  . Murmur, cardiac 12/04/2017  . Coronary artery disease involving native coronary artery of native heart without angina pectoris 12/04/2017  . PVC's (premature ventricular contractions) 12/04/2017  . Heart disease 08/21/2017  . Fatigue 11/14/2016  . Abnormality of gait 12/06/2015  . Abdominal aortic atherosclerosis ([place]) 12/01/2015  . Degeneration of lumbar intervertebral disc 12/01/2015  . Exertional angina ([place]) 07/20/2015  . Hernia of abdominal wall 10/12/2014  . Gynecomastia 04/09/2013  . Hx of CABG 02/02/2013  . Myelodysplastic syndrome 11/06/2012  . Vitamin B12 deficiency 07/07/2012  . Hyperlipemia 07/07/2012  .  Peripheral vascular disease ([place]) 04/14/2009  . CARCINOMA, PROSTATE 04/13/2009  . Non-thrombocytopenic purpura ([place]) 04/13/2009  .  Benign essential hypertension 04/13/2009  . Chronic renal insufficiency, stage III (moderate) 04/13/2009  . SLEEP APNEA 04/13/2009    Gabriela Eves, PT, DPT 12/01/2019, 1:11 PM  [facility] [place], [place], [address] Phone: [phone]   Fax:  [phone]  Name: EZRI LANDERS MRN: [id number] Date of Birth: 1928-10-06

## 2019-12-03 ENCOUNTER — Encounter: Payer: MEDICARE | Admitting: Physical Therapy

## 2019-12-08 ENCOUNTER — Telehealth: Payer: Self-pay | Admitting: Family Medicine

## 2019-12-08 NOTE — Telephone Encounter (Signed)
Pt called stating that he has an appt to see Dr Jannifer Franklin this week and needs [name] to send him his MRI notes/results before his appt.

## 2019-12-08 NOTE — Telephone Encounter (Signed)
[facility] is a part of [facility]. They will be able to view the MRI.

## 2019-12-22 ENCOUNTER — Other Ambulatory Visit: Payer: Self-pay | Admitting: Family Medicine

## 2019-12-22 DIAGNOSIS — R634 Abnormal weight loss: Secondary | ICD-10-CM

## 2019-12-23 ENCOUNTER — Ambulatory Visit (HOSPITAL_COMMUNITY): Payer: MEDICARE

## 2020-01-04 DIAGNOSIS — M79604 Pain in right leg: Secondary | ICD-10-CM | POA: Diagnosis not present

## 2020-01-04 DIAGNOSIS — M1711 Unilateral primary osteoarthritis, right knee: Secondary | ICD-10-CM | POA: Diagnosis not present

## 2020-01-04 DIAGNOSIS — M79659 Pain in unspecified thigh: Secondary | ICD-10-CM | POA: Diagnosis not present

## 2020-01-18 ENCOUNTER — Ambulatory Visit (HOSPITAL_COMMUNITY)
Admission: RE | Admit: 2020-01-18 | Discharge: 2020-01-18 | Disposition: A | Discharge status: Home / Self Care | Payer: MEDICARE | Source: Ambulatory Visit | Attending: Family Medicine | Admitting: Family Medicine

## 2020-01-18 ENCOUNTER — Other Ambulatory Visit: Payer: Self-pay

## 2020-01-18 DIAGNOSIS — K449 Diaphragmatic hernia without obstruction or gangrene: Secondary | ICD-10-CM | POA: Insufficient documentation

## 2020-01-18 DIAGNOSIS — R634 Abnormal weight loss: Secondary | ICD-10-CM | POA: Insufficient documentation

## 2020-01-18 DIAGNOSIS — Z8546 Personal history of malignant neoplasm of prostate: Secondary | ICD-10-CM | POA: Diagnosis not present

## 2020-01-18 DIAGNOSIS — I700 Atherosclerosis of aorta: Secondary | ICD-10-CM | POA: Insufficient documentation

## 2020-01-18 DIAGNOSIS — R911 Solitary pulmonary nodule: Secondary | ICD-10-CM | POA: Diagnosis not present

## 2020-01-18 DIAGNOSIS — M40204 Unspecified kyphosis, thoracic region: Secondary | ICD-10-CM | POA: Diagnosis not present

## 2020-01-18 LAB — POCT I-STAT CREATININE: Creatinine, Ser: 1.6 mg/dL — ABNORMAL HIGH (ref 0.61–1.24)

## 2020-01-18 MED ORDER — IOHEXOL 300 MG/ML  SOLN
75.0000 mL | Freq: Once | INTRAMUSCULAR | Status: AC | PRN
  Administered 2020-01-18: 60 mL via INTRAVENOUS

## 2020-01-20 ENCOUNTER — Encounter: Payer: Self-pay | Admitting: *Deleted

## 2020-01-25 DIAGNOSIS — M79651 Pain in right thigh: Secondary | ICD-10-CM | POA: Diagnosis not present

## 2020-02-01 DIAGNOSIS — M79651 Pain in right thigh: Secondary | ICD-10-CM | POA: Diagnosis not present

## 2020-02-05 ENCOUNTER — Telehealth: Payer: Self-pay | Admitting: Internal Medicine

## 2020-02-05 NOTE — Telephone Encounter (Signed)
Received a staff msg on 11/18 to sch [name] to see Dr. Julien Nordmann. Pt cld and has been scheduled to see Dr. Julien Nordmann on 12/6 at 2:15pm w/labs at 1:45pm. Pt aware to arrive 30 minutes early.

## 2020-02-09 ENCOUNTER — Ambulatory Visit (INDEPENDENT_AMBULATORY_CARE_PROVIDER_SITE_OTHER): Payer: MEDICARE | Admitting: Neurology

## 2020-02-09 ENCOUNTER — Encounter: Payer: Self-pay | Admitting: Neurology

## 2020-02-09 VITALS — BP 160/80 | HR 81 | Ht 72.0 in | Wt 152.5 lb

## 2020-02-09 DIAGNOSIS — I2510 Atherosclerotic heart disease of native coronary artery without angina pectoris: Secondary | ICD-10-CM

## 2020-02-09 DIAGNOSIS — R269 Unspecified abnormalities of gait and mobility: Secondary | ICD-10-CM | POA: Diagnosis not present

## 2020-02-09 DIAGNOSIS — F482 Pseudobulbar affect: Secondary | ICD-10-CM

## 2020-02-09 HISTORY — DX: Pseudobulbar affect: F48.2

## 2020-02-09 MED ORDER — NUEDEXTA 20-10 MG PO CAPS: 1.0000 | ORAL_CAPSULE | Freq: Every day | ORAL | 3 refills | Status: DC

## 2020-02-09 NOTE — Progress Notes (Signed)
Reason for visit: [name] affect, gait disorder  Referring physician: Dr. Daniel Nones is a 86 y.o. male  History of present illness:  Jon Reid is an 86 year old left-handed white male with a history of a chronic gait disorder, he was seen here 3 years ago. The patient has had severe spinal stenosis, he underwent lumbar spine surgery on 05 February 2019 by Dr. Gladstone Lighter. Since then, the patient has noted problems with a change in his emotional status. He is crying at times inappropriately, he does not seem to be able to control this. This issue has continued to the present date. He has also had some worsening of his ability to ambulate. He has fallen 4 times in the last several months. He is also losing weight although he claims that he is eating well. He does have prostate cancer and is being followed for this. The patient reports that he has developed micrographia. He is left-handed but writes with his right hand. Occasionally, he might note some tremors with the right hand. He is sleeping fairly well but takes Tylenol PM. He reports some significant right knee pain, this also impacts his ability to walk, he has become quite stooped when he is ambulating. He reports some mild memory issues, but nothing significant. 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 He keeps up with the finances. He comes to this office for further evaluation.  Past Medical History:  Diagnosis Date  . Arthritis   . Carcinoma of prostate [facility])    prostate  . CKD (chronic kidney disease) stage 2, GFR 60-89 ml/min   . Coronary artery disease    a. LHC (8/14):  3v CAD => s/p CABG (LIMA-LAD, SVG-OM1, SVG-PDA)  . First degree AV block 01/28/2019   Noted on EKG  . GERD (gastroesophageal reflux disease)   . Gout   . HTN (hypertension)   . Hx of echocardiogram    Echocardiogram 11/13/12: EF 55-60%, normal wall motion, biatrial enlargement,  mild AI, mild TR, no pericardial effusion  . Macrocytosis without anemia 01/08/2012  . Moderate aortic stenosis 12/06/2017   Noted on ECHO  . 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 Sleep apnea    Questionable  . Spinal stenosis   . Stroke [facility])     Past Surgical History:  Procedure Laterality Date  . CARDIAC CATHETERIZATION  11/07/2012   Dr Acie Fredrickson  . CATARACT EXTRACTION W/ INTRAOCULAR LENS IMPLANT Bilateral   . CORONARY ARTERY BYPASS GRAFT N/A 11/11/2012   Procedure: CORONARY ARTERY BYPASS GRAFTING times four using Right Greater Saphenous Vein Graft harvested endoscopically and Left Internal Mammary Artery.;  Surgeon: Ivin Poot, MD;  Location: [place];  Service: Open Heart Surgery;  Laterality: N/A;  . INGUINAL HERNIA REPAIR Right 01/10/2017   Procedure: OPEN RIGHT HERNIA REPAIR INGUINAL;  Surgeon: Ileana Roup, MD;  Location: [facility];  Service: General;  Laterality: Right;  . INSERTION OF MESH Right 01/10/2017   Procedure: INSERTION OF MESH;  Surgeon: Ileana Roup, MD;  [facility];  Service: General;  Laterality: Right;  . INTRAOPERATIVE TRANSESOPHAGEAL ECHOCARDIOGRAM N/A 11/11/2012   Procedure: INTRAOPERATIVE TRANSESOPHAGEAL ECHOCARDIOGRAM;  Surgeon: Ivin Poot, MD;  Location: [place];  Service: Open Heart Surgery;  Laterality: N/A;  . LEFT HEART CATHETERIZATION WITH CORONARY ANGIOGRAM N/A 11/07/2012   Procedure: LEFT HEART  CATHETERIZATION WITH CORONARY ANGIOGRAM;  Surgeon: Thayer Headings, MD;  Location: [facility] CATH LAB;  Service: Cardiovascular;  Laterality: N/A;  . LUMBAR LAMINECTOMY/DECOMPRESSION MICRODISCECTOMY N/A 02/05/2019   Procedure: LUMBAR LAMINECTOMY/DECOMPRESSION L3-L4;  Surgeon: Latanya Maudlin, MD;  [facility];  Service: Orthopedics;  Laterality: N/A;  90min  . LYMPH NODE DISSECTION     Bilateral pelvic  . RETROPUBIC PROSTATECTOMY     Radical    Family History  Problem Relation Age of Onset   . Aneurysm Father        Cerebral  . Stroke Father   . Hypertension Father   . Cancer Mother        colon  . Parkinson's disease Son     Social history:  reports that he has never smoked. He has never used smokeless tobacco. He reports current alcohol use. He reports that he does not use drugs.  Medications:  Prior to Admission medications   Medication Sig Start Date End Date Taking? Authorizing Provider  aspirin EC 81 MG tablet Take 81 mg by mouth daily.   Yes [provider]  Cholecalciferol (VITAMIN D) 2000 units CAPS Take 2,000 Units by mouth daily.   Yes [provider]  diclofenac sodium (VOLTAREN) 1 % GEL Apply 2 g topically 4 (four) times daily. 11/07/18  Yes Rakes, Connye Burkitt, FNP  doxycycline (VIBRAMYCIN) 100 MG capsule Take 1 capsule (100 mg total) by mouth 2 (two) times daily. 09/29/19  Yes Claretta Fraise, MD  fluticasone Asencion Islam) 50 MCG/ACT nasal spray One to 2 sprays each nostril at bedtime 10/12/14  Yes Chipper Herb, MD  furosemide (LASIX) 40 MG tablet Take 1 tablet (40 mg total) by mouth daily. 09/29/19  Yes Claretta Fraise, MD  Iron, Ferrous Sulfate, 325 (65 Fe) MG TABS Take 1 tablet by mouth every Monday, Wednesday, and Friday. 09/30/19  Yes Claretta Fraise, MD  naproxen (NAPROSYN) 500 MG tablet Take 1 tablet (500 mg total) by mouth 2 (two) times daily with a meal. 08/06/19  Yes Ivy Lynn, NP  nitroGLYCERIN (NITROSTAT) 0.4 MG SL tablet Place 1 tablet (0.4 mg total) under the tongue every 5 (five) minutes as needed for chest pain. 07/20/15  Yes Kilroy, Luke K, PA-C  pantoprazole (PROTONIX) 40 MG tablet TAKE 1 TABLET ONCE DAILY FOR REFLUX 09/29/19  Yes Stacks, [name], MD  triamcinolone cream (KENALOG) 0.1 % Apply 1 application topically 2 (two) times daily. Patient taking differently: Apply 1 application topically daily as needed (itching).  04/30/17  Yes Chipper Herb, MD      Allergies  Allergen Reactions  . Crestor [Rosuvastatin]     GYNECOMASTIA  AND SWELLING    ROS:  Out of a complete 14 system review of symptoms, the patient complains only of the following symptoms, and all other reviewed systems are negative.  Emotional outbursts walking difficulty knee pain  Blood pressure (!) 160/80, pulse 81, height 6' (1.829 m), weight 152 lb 8 oz (69.2 kg).  Physical Exam  General: The patient is alert and cooperative at the time of the examination.  Eyes: Pupils are equal, round, and reactive to light. Discs are flat bilaterally.  Neck: The neck is supple, no carotid bruits are noted. The cardiac murmur radiates into the carotids bilaterally.  Respiratory: The respiratory examination is clear.  Cardiovascular: The cardiovascular examination reveals a regular rate and rhythm, a grade III/VI systolic ejection murmur at the aortic area is noted.  Skin: Extremities are with 1+ edema below the knees  bilaterally.  Neurologic Exam  Mental status: The patient is alert and oriented x 3 at the time of the examination. The Mini-Mental status examination done today shows a total score 24/30.  Cranial nerves: Facial symmetry is present. There is good sensation of the face to pinprick and soft touch bilaterally. The strength of the facial muscles and the muscles to head turning and shoulder shrug are normal bilaterally. Speech is well enunciated, no aphasia or dysarthria is noted. Extraocular movements are full. Visual fields are full. The tongue is midline, and the patient has symmetric elevation of the soft palate. No obvious hearing deficits are noted.  Motor: The motor testing reveals 5 over 5 strength of all 4 extremities. Good symmetric motor tone is noted throughout.  Sensory: Sensory testing is intact to pinprick, soft touch, vibration sensation, and position sense on all 4 extremities. No evidence of extinction is noted.  Coordination: Cerebellar testing reveals good finger-nose-finger and heel-to-shin bilaterally. Occasionally, a  resting tremor is noted with the right upper extremity.  Gait and station: The patient is able to ambulate independently, does shuffle his feet slightly, he holds his right arm in flexion with walking, has arm swing on the left. The patient has negative Romberg. Tandem gait was not attempted.  Reflexes: Deep tendon reflexes are symmetric and normal bilaterally. Toes are downgoing bilaterally.    MRI brain 11/02/19:  IMPRESSION: 1. No acute intracranial abnormality. 2. Mild chronic small vessel ischemic disease and moderate cerebral Atrophy.  * MRI scan images were reviewed online. I agree with the written report.    Assessment/Plan:  1. Gait disorder  2. [name] affect  The patient has ongoing issues with his walking, he believes that this has worsened some since his surgery. The patient may be developing signs of parkinsonism, he does have a slight tremor on the right arm and holds his right arm in flexion with walking and he reports micrographia. [name] affect can be associated with Parkinson's disease. The patient be set up for physical therapy for his walking, he will be placed on Nuedexta. He will follow-up here in 3 to 4 months. We may consider addition of Sinemet in the future if the signs of parkinsonism develop.  Jon Alexanders MD 02/09/2020 10:24 AM  [facility] [address] [place],  [address]  Phone [phone] Fax [phone]

## 2020-02-09 NOTE — Patient Instructions (Signed)
We will start nuedexta for the emotional changes.

## 2020-02-19 ENCOUNTER — Ambulatory Visit: Payer: Medicare Other | Attending: Orthopedic Surgery | Admitting: Physical Therapy

## 2020-02-19 ENCOUNTER — Encounter: Payer: Self-pay | Admitting: Physical Therapy

## 2020-02-19 ENCOUNTER — Other Ambulatory Visit: Payer: Self-pay

## 2020-02-19 DIAGNOSIS — R2681 Unsteadiness on feet: Secondary | ICD-10-CM | POA: Insufficient documentation

## 2020-02-19 DIAGNOSIS — R296 Repeated falls: Secondary | ICD-10-CM | POA: Diagnosis not present

## 2020-02-19 DIAGNOSIS — R262 Difficulty in walking, not elsewhere classified: Secondary | ICD-10-CM

## 2020-02-19 DIAGNOSIS — M6281 Muscle weakness (generalized): Secondary | ICD-10-CM | POA: Diagnosis not present

## 2020-02-19 NOTE — Therapy (Signed)
[facility] [place], [place], [address] Phone: [phone]   Fax:  [phone]  Physical Therapy Evaluation  Patient Details  Name: Jon Reid MRN: [id number] Date of Birth: 10/24/1928 Referring Provider (PT): Margette Fast, MD   Encounter Date: 02/19/2020   PT End of Session - 02/19/20 1227    Visit Number 1    Number of Visits 8    Date for PT Re-Evaluation 03/25/20    Authorization Type Progress note after 10th visit    PT Start Time 1030    PT Stop Time 1112    PT Time Calculation (min) 42 min    Activity Tolerance Patient tolerated treatment well    Behavior During Therapy [facility] for tasks assessed/performed           Past Medical History:  Diagnosis Date   Arthritis    Carcinoma of prostate ([place])    prostate   CKD (chronic kidney disease) stage 2, GFR 60-89 ml/min    Coronary artery disease    a. LHC (8/14):  3v CAD => s/p CABG (LIMA-LAD, SVG-OM1, SVG-PDA)   First degree AV block 01/28/2019   Noted on EKG   GERD (gastroesophageal reflux disease)    Gout    HTN (hypertension)    Hx of echocardiogram    Echocardiogram 11/13/12: EF 55-60%, normal wall motion, biatrial enlargement, mild AI, mild TR, no pericardial effusion   Macrocytosis without anemia 01/08/2012   Moderate aortic stenosis 12/06/2017   Noted on ECHO   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   Sleep apnea    Questionable   Spinal stenosis    Stroke [facility])     Past Surgical History:  Procedure Laterality Date   CARDIAC CATHETERIZATION  11/07/2012   Dr Acie Fredrickson   CATARACT EXTRACTION W/ INTRAOCULAR LENS IMPLANT Bilateral    CORONARY ARTERY BYPASS GRAFT N/A 11/11/2012   Procedure: CORONARY ARTERY BYPASS GRAFTING times four using Right Greater Saphenous Vein Graft harvested endoscopically and Left Internal Mammary Artery.;  Surgeon: Ivin Poot, MD;  Location: [place];  Service: Open Heart Surgery;  Laterality: N/A;   INGUINAL HERNIA REPAIR Right 01/10/2017   Procedure: OPEN RIGHT HERNIA REPAIR INGUINAL;  Surgeon: Ileana Roup, MD;  [facility];  Service: General;  Laterality: Right;   INSERTION OF MESH Right 01/10/2017   Procedure: INSERTION OF MESH;  Surgeon: Ileana Roup, MD;  [facility];  Service: General;  Laterality: Right;   INTRAOPERATIVE TRANSESOPHAGEAL ECHOCARDIOGRAM N/A 11/11/2012   Procedure: INTRAOPERATIVE TRANSESOPHAGEAL ECHOCARDIOGRAM;  Surgeon: Ivin Poot, MD;  Location: [place];  Service: Open Heart Surgery;  Laterality: N/A;   LEFT HEART CATHETERIZATION WITH CORONARY ANGIOGRAM N/A 11/07/2012   Procedure: LEFT HEART CATHETERIZATION WITH CORONARY ANGIOGRAM;  Surgeon: Thayer Headings, MD;  Location: [facility] CATH LAB;  Service: Cardiovascular;  Laterality: N/A;   LUMBAR LAMINECTOMY/DECOMPRESSION MICRODISCECTOMY N/A 02/05/2019   Procedure: LUMBAR LAMINECTOMY/DECOMPRESSION L3-L4;  Surgeon: Latanya Maudlin, MD;  [facility];  Service: Orthopedics;  Laterality: N/A;  26min   LYMPH NODE DISSECTION     Bilateral pelvic   RETROPUBIC PROSTATECTOMY     Radical    There were no vitals filed for this visit.    Subjective Assessment - 02/19/20 1221    Subjective COVID-19 screening performed upon arrival. Patient arrives to physical therapy with reports of frequent falls that has  progressively worsened since lumbar surgery in 2020. Patient reports most recent fall occured 02/15/2020 when he was raking leaves. Patient had to wait for someone to assist him to standing. Patient able to complete all basic ADLs independently. Patient reports walking for about 1 mile in [employer] daily. Patient has ADs but does not use them. Patient's goals are to improve balance, improve strength, and decrease falls.    Pertinent History CKD, CAD, HTN, history of stroke, CABG x4, lumbar laminectomy/decompression  microdiscectomy 02/05/2019    Limitations House hold activities;Walking;Standing;Sitting    Diagnostic tests --    Patient Stated Goals decrease pain, walk better    Currently in Pain? Yes    Pain Score 9     Pain Location Leg    Pain Orientation Right    Pain Descriptors / Indicators Aching;Sore    Pain Type Chronic pain    Pain Onset More than a month ago    Pain Frequency Constant    Aggravating Factors  pressure    Pain Relieving Factors not using    Effect of Pain on Daily Activities unable to walk              [facility] PT Assessment - 02/19/20 0001      Assessment   Medical Diagnosis Gait abnormality    Referring Provider (PT) Margette Fast, MD    Onset Date/Surgical Date --   ongoing, progressed since 02/05/2019   Next MD Visit June 01, 2019    Prior Therapy yes for knee      Precautions   Precautions Fall      Restrictions   Weight Bearing Restrictions No      Balance Screen   Has the patient fallen in the past 6 months Yes    How many times? ~7    Has the patient had a decrease in activity level because of a fear of falling?  Yes    Is the patient reluctant to leave their home because of a fear of falling?  Yes      [place] residence    Living Arrangements Other (Comment)   Family   Home Layout Two level    Alternate Level Stairs-Number of Steps 16    Alternate Level Stairs-Rails Can reach both      Prior Function   Level of Independence Independent with basic ADLs      Posture/Postural Control   Posture/Postural Control Postural limitations    Postural Limitations Rounded Shoulders;Forward head;Increased thoracic kyphosis;Flexed trunk      Strength   Overall Strength Deficits    Overall Strength Comments hip abduction and adduction MMT performed in sitting    Strength Assessment Site Ankle;Hip;Knee    Right/Left Hip Right;Left    Right Hip Flexion 4-/5    Right Hip ABduction 3+/5    Right Hip ADduction 3+/5     Left Hip Flexion 3+/5    Left Hip ABduction 3+/5    Left Hip ADduction 3+/5    Right Knee Flexion 4-/5    Right Knee Extension 4/5    Left Knee Flexion 4-/5    Left Knee Extension 4/5    Right/Left Ankle Right;Left    Right Ankle Dorsiflexion 4/5    Right Ankle Plantar Flexion 4/5    Right Ankle Inversion 3+/5    Right Ankle Eversion 3+/5    Left Ankle Dorsiflexion 4/5    Left Ankle Plantar Flexion 4/5    Left Ankle Inversion  3+/5    Left Ankle Eversion 3+/5      Ambulation/Gait   Gait Pattern Step-to pattern;Step-through pattern;Decreased step length - left;Decreased stance time - right;Decreased stride length;Decreased hip/knee flexion - right;Decreased weight shift to right;Antalgic;Trunk flexed;Wide base of support    Gait Comments mainly head toward floor      Standardized Balance Assessment   Standardized Balance Assessment Five Times Sit to Stand;Berg Balance Test    Five times sit to stand comments  with UE support 17.4 seconds      Berg Balance Test   Sit to Stand Able to stand  independently using hands    Standing Unsupported Able to stand 2 minutes with supervision    Sitting with Back Unsupported but Feet Supported on Floor or Stool Able to sit safely and securely 2 minutes    Stand to Sit Uses backs of legs against chair to control descent    Transfers Able to transfer safely, definite need of hands    Standing Unsupported with Eyes Closed Able to stand 10 seconds with supervision    Standing Unsupported with Feet Together Able to place feet together independently and stand for 1 minute with supervision    From Standing, Reach Forward with Outstretched Arm Can reach forward >12 cm safely (5")    From Standing Position, Pick up Object from Floor Able to pick up shoe, needs supervision    From Standing Position, Turn to Look Behind Over each Shoulder Turn sideways only but maintains balance    Turn 360 Degrees Able to turn 360 degrees safely but slowly    Standing  Unsupported, Alternately Place Feet on Step/Stool Needs assistance to keep from falling or unable to try    Standing Unsupported, One Foot in [employer] balance while stepping or standing    Standing on One Leg Tries to lift leg/unable to hold 3 seconds but remains standing independently    Total Score 32                      Objective measurements completed on examination: See above findings.               PT Education - 02/19/20 1226    Education Details LAQ, seated heel toe raises, marching, scapular retractions    Person(s) Educated Patient    Methods Explanation;Demonstration;Handout    Comprehension Verbalized understanding;Returned demonstration            PT Short Term Goals - 10/28/19 2032      PT SHORT TERM GOAL #1   Title STG=LTG             PT Long Term Goals - 02/19/20 1231      PT LONG TERM GOAL #1   Title Patient will be independent with HEP.    Time 4    Period Weeks    Status New      PT LONG TERM GOAL #2   Title Patient will perform 5x sit to stand test in 15 seconds or less with no UE support to improve balance and improve functional LE strength.    Time 4    Period Weeks    Status New      PT LONG TERM GOAL #3   Title Patient will decrease risk of falls as noted by a 4+ improvement on Berg Balance Scale.    Baseline 32/56    Time 4    Period Weeks    Status New  PT LONG TERM GOAL #4   Title Patient will demonstrate  4+/5 or greater bilateral LE MMT to improve stability during functional tasks.    Baseline --    Time 4    Period Weeks    Status New                  Plan - 02/19/20 1227    Clinical Impression Statement Patient is an 86 year old male who presents to physical therapy with difficulty walking, decreased balance, and decreased bilateral LE MMT . Patient's Berg Balance score of 32/56 categorizes him as a moderate fall risk. Patient ambulates without an AD with decreased stride length, varying  step to and step through pattern, and flexed trunk. Patient and PT discussed HEP and plan of care to which he reported understanding. Patient would benefit from skilled physical therapy to address deficits and goals.    Personal Factors and Comorbidities Comorbidity 3+;Age;Time since onset of injury/illness/exacerbation    Comorbidities CKD, HTN, history of stroke, CABG x4, lumbar laminectomy/decompression microdiscectomy 02/05/2019    Examination-Activity Limitations Locomotion Level;Transfers;Stairs;Stand    Stability/Clinical Decision Making Stable/Uncomplicated    Clinical Decision Making Low    Rehab Potential Fair    PT Frequency 2x / week    PT Duration 4 weeks    PT Treatment/Interventions ADLs/Self Care Home Management;Cryotherapy;Electrical Stimulation;Moist Heat;Therapeutic activities;Functional mobility training;Stair training;Gait training;Therapeutic exercise;Balance training;Neuromuscular re-education;Manual techniques;Passive range of motion;Patient/family education;Vasopneumatic Device    PT Next Visit Plan nustep, balance activities and LE strengthening.    PT Home Exercise Plan see patient education section    Consulted and Agree with Plan of Care Patient           Patient will benefit from skilled therapeutic intervention in order to improve the following deficits and impairments:  Abnormal gait, Decreased activity tolerance, Decreased balance, Decreased mobility, Decreased strength, Pain, Difficulty walking, Decreased range of motion  Visit Diagnosis: Repeated falls - Plan: PT plan of care cert/re-cert  Unsteadiness on feet - Plan: PT plan of care cert/re-cert  Difficulty in walking, not elsewhere classified - Plan: PT plan of care cert/re-cert  Muscle weakness (generalized) - Plan: PT plan of care cert/re-cert     Problem List Patient Active Problem List   Diagnosis Date Noted   [name] affect 02/09/2020   Iron deficiency anemia due to chronic blood  loss 05/27/2019   Nonrheumatic mitral valve regurgitation 05/26/2019   Left anterior fascicular block 05/26/2019   Educated about COVID-19 virus infection 04/06/2019   Drug-induced constipation 02/19/2019   Elevated troponin 02/09/2019   Fecal occult blood test positive 02/09/2019   Spinal stenosis, lumbar region with neurogenic claudication 02/05/2019   Leg swelling 01/26/2019   Nonrheumatic aortic valve stenosis 01/26/2019   Multiple joint pain 12/11/2018   Chronic pain of both knees 11/07/2018   Vitamin D deficiency 10/23/2018   Gastroesophageal reflux disease with esophagitis 03/13/2018   Murmur, cardiac 12/04/2017   Coronary artery disease involving native coronary artery of native heart without angina pectoris 12/04/2017   PVC's (premature ventricular contractions) 12/04/2017   Heart disease 08/21/2017   Fatigue 11/14/2016   Abnormality of gait 12/06/2015   Abdominal aortic atherosclerosis ([place]) 12/01/2015   Degeneration of lumbar intervertebral disc 12/01/2015   Exertional angina ([place]) 07/20/2015   Hernia of abdominal wall 10/12/2014   Gynecomastia 04/09/2013   Hx of CABG 02/02/2013   Myelodysplastic syndrome 11/06/2012   Vitamin B12 deficiency 07/07/2012   Hyperlipemia 07/07/2012   Peripheral vascular disease ([place])  04/14/2009   CARCINOMA, PROSTATE 04/13/2009   Non-thrombocytopenic purpura ([place]) 04/13/2009   Benign essential hypertension 04/13/2009   Chronic renal insufficiency, stage III (moderate) ([place]) 04/13/2009   SLEEP APNEA 04/13/2009    Gabriela Eves, PT, DPT 02/19/2020, 12:36 PM  [facility] [address] [place], [place], [address] Phone: [phone]   Fax:  [phone]  Name: Jon Reid MRN: [id number] Date of Birth: 1928/05/17

## 2020-02-22 ENCOUNTER — Other Ambulatory Visit: Payer: Self-pay | Admitting: *Deleted

## 2020-02-22 ENCOUNTER — Other Ambulatory Visit: Payer: Self-pay

## 2020-02-22 ENCOUNTER — Inpatient Hospital Stay: Payer: MEDICARE

## 2020-02-22 ENCOUNTER — Inpatient Hospital Stay: Payer: MEDICARE | Attending: Internal Medicine | Admitting: Internal Medicine

## 2020-02-22 VITALS — BP 164/93 | HR 70 | Temp 98.1°F | Resp 20 | Ht 72.0 in | Wt 153.7 lb

## 2020-02-22 DIAGNOSIS — Z800 Family history of malignant neoplasm of digestive organs: Secondary | ICD-10-CM

## 2020-02-22 DIAGNOSIS — M79604 Pain in right leg: Secondary | ICD-10-CM | POA: Diagnosis not present

## 2020-02-22 DIAGNOSIS — G4733 Obstructive sleep apnea (adult) (pediatric): Secondary | ICD-10-CM | POA: Diagnosis not present

## 2020-02-22 DIAGNOSIS — E539 Vitamin B deficiency, unspecified: Secondary | ICD-10-CM

## 2020-02-22 DIAGNOSIS — D509 Iron deficiency anemia, unspecified: Secondary | ICD-10-CM

## 2020-02-22 DIAGNOSIS — D469 Myelodysplastic syndrome, unspecified: Secondary | ICD-10-CM

## 2020-02-22 DIAGNOSIS — E538 Deficiency of other specified B group vitamins: Secondary | ICD-10-CM

## 2020-02-22 DIAGNOSIS — D539 Nutritional anemia, unspecified: Secondary | ICD-10-CM | POA: Insufficient documentation

## 2020-02-22 DIAGNOSIS — Z8546 Personal history of malignant neoplasm of prostate: Secondary | ICD-10-CM

## 2020-02-22 DIAGNOSIS — I350 Nonrheumatic aortic (valve) stenosis: Secondary | ICD-10-CM

## 2020-02-22 DIAGNOSIS — I25810 Atherosclerosis of coronary artery bypass graft(s) without angina pectoris: Secondary | ICD-10-CM

## 2020-02-22 DIAGNOSIS — M1990 Unspecified osteoarthritis, unspecified site: Secondary | ICD-10-CM | POA: Diagnosis not present

## 2020-02-22 DIAGNOSIS — Z8673 Personal history of transient ischemic attack (TIA), and cerebral infarction without residual deficits: Secondary | ICD-10-CM | POA: Diagnosis not present

## 2020-02-22 DIAGNOSIS — Z951 Presence of aortocoronary bypass graft: Secondary | ICD-10-CM | POA: Diagnosis not present

## 2020-02-22 DIAGNOSIS — N189 Chronic kidney disease, unspecified: Secondary | ICD-10-CM | POA: Diagnosis not present

## 2020-02-22 LAB — CBC WITH DIFFERENTIAL (CANCER CENTER ONLY)
Abs Immature Granulocytes: 0.02 10*3/uL (ref 0.00–0.07)
Basophils Absolute: 0.1 10*3/uL (ref 0.0–0.1)
Basophils Relative: 1 %
Eosinophils Absolute: 0.1 10*3/uL (ref 0.0–0.5)
Eosinophils Relative: 1 %
HCT: 39.7 % (ref 39.0–52.0)
Hemoglobin: 12.9 g/dL — ABNORMAL LOW (ref 13.0–17.0)
Immature Granulocytes: 0 %
Lymphocytes Relative: 27 %
Lymphs Abs: 2 10*3/uL (ref 0.7–4.0)
MCH: 33.8 pg (ref 26.0–34.0)
MCHC: 32.5 g/dL (ref 30.0–36.0)
MCV: 103.9 fL — ABNORMAL HIGH (ref 80.0–100.0)
Monocytes Absolute: 0.5 10*3/uL (ref 0.1–1.0)
Monocytes Relative: 7 %
Neutro Abs: 4.9 10*3/uL (ref 1.7–7.7)
Neutrophils Relative %: 64 %
Platelet Count: 255 10*3/uL (ref 150–400)
RBC: 3.82 MIL/uL — ABNORMAL LOW (ref 4.22–5.81)
RDW: 12.5 % (ref 11.5–15.5)
WBC Count: 7.6 10*3/uL (ref 4.0–10.5)
nRBC: 0 % (ref 0.0–0.2)

## 2020-02-22 LAB — CMP (CANCER CENTER ONLY)
ALT: 16 U/L (ref 0–44)
AST: 24 U/L (ref 15–41)
Albumin: 4.1 g/dL (ref 3.5–5.0)
Alkaline Phosphatase: 71 U/L (ref 38–126)
Anion gap: 7 (ref 5–15)
BUN: 14 mg/dL (ref 8–23)
CO2: 29 mmol/L (ref 22–32)
Calcium: 9.5 mg/dL (ref 8.9–10.3)
Chloride: 106 mmol/L (ref 98–111)
Creatinine: 1.41 mg/dL — ABNORMAL HIGH (ref 0.61–1.24)
GFR, Estimated: 48 mL/min — ABNORMAL LOW (ref >60)
Glucose, Bld: 95 mg/dL (ref 70–99)
Potassium: 3.7 mmol/L (ref 3.5–5.1)
Sodium: 142 mmol/L (ref 135–145)
Total Bilirubin: 0.8 mg/dL (ref 0.3–1.2)
Total Protein: 7.1 g/dL (ref 6.5–8.1)

## 2020-02-22 NOTE — Progress Notes (Signed)
[place] [phone]   [phone]  CONSULT NOTE  REFERRING PHYSICIAN: Dr. Claretta Fraise  REASON FOR CONSULTATION:  86 years old white male with microcytic anemia.  HPI Jon Reid is a 86 y.o. male with past medical history significant for multiple medical problems including history of prostate adenocarcinoma as well as chronic kidney disease, coronary artery disease, osteoarthritis, hypertension, aortic stenosis, stroke, obstructive sleep apnea.  I saw the patient several years ago for evaluation of macrocytosis without anemia.  He had extensive work-up at that time that was unremarkable and it was felt to be secondary to nutritional deficiency.  The patient has been doing fine for several years.  His previous primary care physician retired and he was seen recently by Dr. Livia Snellen and repeat blood work showed evidence for microcytic anemia again.  The patient was referred for reevaluation and recommendation regarding his condition. When seen today he has been complaining of pain on the right leg and he is followed by orthopedic surgery. He denied having any current chest pain but has shortness of breath with exertion with no cough or hemoptysis.  He has no nausea, vomiting, diarrhea or constipation.  He is not on any supplements at this point.  He was treated in the past with monthly vitamin B12 injection but not over the last few years since the retirement of his previous primary care physician. HPI  Past Medical History:  Diagnosis Date  . Arthritis   . Carcinoma of prostate [facility])    prostate  . CKD (chronic kidney disease) stage 2, GFR 60-89 ml/min   . Coronary artery disease    a. LHC (8/14):  3v CAD => s/p CABG (LIMA-LAD, SVG-OM1, SVG-PDA)  . First degree AV block 01/28/2019   Noted on EKG  . GERD (gastroesophageal reflux disease)   . Gout   . HTN (hypertension)   . Hx of echocardiogram    Echocardiogram 11/13/12: EF 55-60%, normal wall  motion, biatrial enlargement, mild AI, mild TR, no pericardial effusion  . Macrocytosis without anemia 01/08/2012  . Moderate aortic stenosis 12/06/2017   Noted on ECHO  . [name] affect 02/09/2020  . 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 Sleep apnea    Questionable  . Spinal stenosis   . Stroke [facility])     Past Surgical History:  Procedure Laterality Date  . CARDIAC CATHETERIZATION  11/07/2012   Dr Acie Fredrickson  . CATARACT EXTRACTION W/ INTRAOCULAR LENS IMPLANT Bilateral   . CORONARY ARTERY BYPASS GRAFT N/A 11/11/2012   Procedure: CORONARY ARTERY BYPASS GRAFTING times four using Right Greater Saphenous Vein Graft harvested endoscopically and Left Internal Mammary Artery.;  Surgeon: Ivin Poot, MD;  Location: [place];  Service: Open Heart Surgery;  Laterality: N/A;  . INGUINAL HERNIA REPAIR Right 01/10/2017   Procedure: OPEN RIGHT HERNIA REPAIR INGUINAL;  Surgeon: Ileana Roup, MD;  [facility];  Service: General;  Laterality: Right;  . INSERTION OF MESH Right 01/10/2017   Procedure: INSERTION OF MESH;  Surgeon: Ileana Roup, MD;  [facility];  Service: General;  Laterality: Right;  . INTRAOPERATIVE TRANSESOPHAGEAL ECHOCARDIOGRAM N/A 11/11/2012   Procedure: INTRAOPERATIVE TRANSESOPHAGEAL ECHOCARDIOGRAM;  Surgeon: Ivin Poot, MD;  Location: [place];  Service: Open Heart Surgery;  Laterality: N/A;  . LEFT HEART CATHETERIZATION WITH CORONARY ANGIOGRAM N/A 11/07/2012   Procedure: LEFT HEART CATHETERIZATION WITH CORONARY ANGIOGRAM;  Surgeon:  Thayer Headings, MD;  Location: [facility] CATH LAB;  Service: Cardiovascular;  Laterality: N/A;  . LUMBAR LAMINECTOMY/DECOMPRESSION MICRODISCECTOMY N/A 02/05/2019   Procedure: LUMBAR LAMINECTOMY/DECOMPRESSION L3-L4;  Surgeon: Latanya Maudlin, MD;  [facility];  Service: Orthopedics;  Laterality: N/A;  21min  . LYMPH NODE DISSECTION     Bilateral pelvic  . RETROPUBIC PROSTATECTOMY      Radical    Family History  Problem Relation Age of Onset  . Aneurysm Father        Cerebral  . Stroke Father   . Hypertension Father   . Cancer Mother        colon  . Parkinson's disease Son     Social History Social History   Tobacco Use  . Smoking status: Never Smoker  . Smokeless tobacco: Never Used  Vaping Use  . Vaping Use: Never used  Substance Use Topics  . Alcohol use: Yes    Comment: Rare  . Drug use: No    Allergies  Allergen Reactions  . Crestor [Rosuvastatin]     GYNECOMASTIA AND SWELLING    Current Outpatient Medications  Medication Sig Dispense Refill  . aspirin EC 81 MG tablet Take 81 mg by mouth daily.    . Cholecalciferol (VITAMIN D) 2000 units CAPS Take 2,000 Units by mouth daily.    . diclofenac sodium (VOLTAREN) 1 % GEL Apply 2 g topically 4 (four) times daily. 150 g 3  . fluticasone (FLONASE) 50 MCG/ACT nasal spray One to 2 sprays each nostril at bedtime 16 g 6  . furosemide (LASIX) 40 MG tablet Take 1 tablet (40 mg total) by mouth daily. 90 tablet 0  . Iron, Ferrous Sulfate, 325 (65 Fe) MG TABS Take 1 tablet by mouth every Monday, Wednesday, and Friday. 30 tablet 6  . pantoprazole (PROTONIX) 40 MG tablet TAKE 1 TABLET ONCE DAILY FOR REFLUX 90 tablet 1  . triamcinolone cream (KENALOG) 0.1 % Apply 1 application topically 2 (two) times daily. (Patient taking differently: Apply 1 application topically daily as needed (itching). ) 30 g 3  . Dextromethorphan-quiNIDine (NUEDEXTA) 20-10 MG capsule Take 1 capsule by mouth daily. (Patient not taking: Reported on 02/22/2020) 30 capsule 3  . naproxen (NAPROSYN) 500 MG tablet Take 1 tablet (500 mg total) by mouth 2 (two) times daily with a meal. (Patient not taking: Reported on 02/22/2020) 10 tablet 0  . nitroGLYCERIN (NITROSTAT) 0.4 MG SL tablet Place 1 tablet (0.4 mg total) under the tongue every 5 (five) minutes as needed for chest pain. (Patient not taking: Reported on 02/22/2020) 25 tablet PRN   No  current facility-administered medications for this visit.    Review of Systems  Constitutional: positive for fatigue Eyes: negative Ears, nose, mouth, throat, and face: negative Respiratory: negative Cardiovascular: negative Gastrointestinal: negative Genitourinary:negative Integument/breast: negative Hematologic/lymphatic: negative Musculoskeletal:positive for arthralgias and muscle weakness Neurological: negative Behavioral/Psych: negative Endocrine: negative Allergic/Immunologic: negative  Physical Exam  PYP:PJKDT, healthy, no distress, well nourished and well developed SKIN: skin color, texture, turgor are normal, no rashes or significant lesions HEAD: Normocephalic, No masses, lesions, tenderness or abnormalities EYES: normal, PERRLA, Conjunctiva are pink and non-injected EARS: External ears normal, Canals clear OROPHARYNX:no exudate, no erythema and lips, buccal mucosa, and tongue normal  NECK: supple, no adenopathy, no JVD LYMPH:  no palpable lymphadenopathy, no hepatosplenomegaly LUNGS: clear to auscultation , and palpation HEART: regular rate & rhythm, no murmurs and no gallops ABDOMEN:abdomen soft, non-tender, normal bowel sounds and no masses or organomegaly BACK: Back  symmetric, no curvature., No CVA tenderness EXTREMITIES:no joint deformities, effusion, or inflammation  NEURO: alert & oriented x 3 with fluent speech, no focal motor/sensory deficits  PERFORMANCE STATUS: ECOG 1  LABORATORY DATA: Lab Results  Component Value Date   WBC 7.6 02/22/2020   HGB 12.9 (L) 02/22/2020   HCT 39.7 02/22/2020   MCV 103.9 (H) 02/22/2020   PLT 255 02/22/2020      Chemistry      Component Value Date/Time   NA 143 10/06/2019 1033   NA 140 04/17/2012 1110   K 4.4 10/06/2019 1033   K 4.1 04/17/2012 1110   CL 102 10/06/2019 1033   CL 105 04/17/2012 1110   CO2 25 10/06/2019 1033   CO2 25 04/17/2012 1110   BUN 21 10/06/2019 1033   BUN 23.9 04/17/2012 1110    CREATININE 1.60 (H) 01/18/2020 1058   CREATININE 1.53 (H) 11/06/2012 1058   CREATININE 1.5 (H) 04/17/2012 1110      Component Value Date/Time   CALCIUM 9.7 10/06/2019 1033   CALCIUM 9.3 04/17/2012 1110   ALKPHOS 88 10/06/2019 1033   ALKPHOS 89 04/17/2012 1110   AST 20 10/06/2019 1033   AST 25 04/17/2012 1110   ALT 10 10/06/2019 1033   ALT 12 04/17/2012 1110   BILITOT 0.5 10/06/2019 1033   BILITOT 0.97 04/17/2012 1110       RADIOGRAPHIC STUDIES: No results found.  ASSESSMENT: This is a very pleasant 86 years old white male with history of macrocytic anemia likely secondary to nutritional deficiency.  The patient has been treated in the past with vitamin B12 injection and has been doing well for several years but this treatment was discontinued few years ago after he changed primary care physician.   PLAN: I had a lengthy discussion with the patient and his son today about his current condition and treatment options.  Repeat CBC today showed very mild macrocytic anemia. I recommended for the patient to resume his vitamin B12 injection by his primary care physician on monthly basis at this point.  I also recommended for the patient to start using once daily multivitamins and minerals over-the-counter. I do not see a need to proceed with extensive blood work and investigation at this point but I will be happy to reevaluate the patient again if he has no improvement of his condition or worsening of his macrocytic anemia. The patient and his son agreed to the current plan especially with his current age and condition. He was advised to call immediately if he has any other concerning symptoms in the interval.  The patient voices understanding of current disease status and treatment options and is in agreement with the current care plan.  All questions were answered. The patient knows to call the clinic with any problems, questions or concerns. We can certainly see the patient much sooner if  necessary.  Thank you so much for allowing me to participate in the care of Jon Reid. I will continue to follow up the patient with you and assist in his care.  The total time spent in the appointment was 60 minutes.  Disclaimer: This note was dictated with voice recognition software. Similar sounding words can inadvertently be transcribed and may not be corrected upon review.   Eilleen Kempf February 22, 2020, 2:31 PM

## 2020-02-23 ENCOUNTER — Ambulatory Visit: Payer: Medicare Other | Admitting: Physical Therapy

## 2020-02-23 ENCOUNTER — Encounter: Payer: Self-pay | Admitting: Physical Therapy

## 2020-02-23 ENCOUNTER — Other Ambulatory Visit: Payer: Self-pay

## 2020-02-23 DIAGNOSIS — R296 Repeated falls: Secondary | ICD-10-CM

## 2020-02-23 DIAGNOSIS — R2681 Unsteadiness on feet: Secondary | ICD-10-CM

## 2020-02-23 DIAGNOSIS — M6281 Muscle weakness (generalized): Secondary | ICD-10-CM

## 2020-02-23 DIAGNOSIS — R262 Difficulty in walking, not elsewhere classified: Secondary | ICD-10-CM

## 2020-02-23 NOTE — Therapy (Signed)
[facility] [place], [place], [address] Phone: [phone]   Fax:  [phone]  Physical Therapy Treatment  Patient Details  Name: Jon Reid MRN: [id number] Date of Birth: Mar 13, 1929 Referring Provider (PT): Margette Fast, MD   Encounter Date: 02/23/2020   PT End of Session - 02/23/20 0949    Visit Number 2    Number of Visits 8    Date for PT Re-Evaluation 03/25/20    Authorization Type Progress note after 10th visit    PT Start Time 0901    PT Stop Time 0942    PT Time Calculation (min) 41 min    Activity Tolerance Patient tolerated treatment well    Behavior During Therapy [facility] for tasks assessed/performed           Past Medical History:  Diagnosis Date  . Arthritis   . Carcinoma of prostate [facility])    prostate  . CKD (chronic kidney disease) stage 2, GFR 60-89 ml/min   . Coronary artery disease    a. LHC (8/14):  3v CAD => s/p CABG (LIMA-LAD, SVG-OM1, SVG-PDA)  . First degree AV block 01/28/2019   Noted on EKG  . GERD (gastroesophageal reflux disease)   . Gout   . HTN (hypertension)   . Hx of echocardiogram    Echocardiogram 11/13/12: EF 55-60%, normal wall motion, biatrial enlargement, mild AI, mild TR, no pericardial effusion  . Macrocytosis without anemia 01/08/2012  . Moderate aortic stenosis 12/06/2017   Noted on ECHO  . [name] affect 02/09/2020  . 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 Sleep apnea    Questionable  . Spinal stenosis   . Stroke [facility])     Past Surgical History:  Procedure Laterality Date  . CARDIAC CATHETERIZATION  11/07/2012   Dr Acie Fredrickson  . CATARACT EXTRACTION W/ INTRAOCULAR LENS IMPLANT Bilateral   . CORONARY ARTERY BYPASS GRAFT N/A 11/11/2012   Procedure: CORONARY ARTERY BYPASS GRAFTING times four using Right Greater Saphenous Vein Graft harvested endoscopically and Left Internal Mammary Artery.;  Surgeon: Ivin Poot, MD;  Location: [place];  Service: Open Heart Surgery;  Laterality: N/A;  . INGUINAL HERNIA REPAIR Right 01/10/2017   Procedure: OPEN RIGHT HERNIA REPAIR INGUINAL;  Surgeon: Ileana Roup, MD;  [facility];  Service: General;  Laterality: Right;  . INSERTION OF MESH Right 01/10/2017   Procedure: INSERTION OF MESH;  Surgeon: Ileana Roup, MD;  [facility];  Service: General;  Laterality: Right;  . INTRAOPERATIVE TRANSESOPHAGEAL ECHOCARDIOGRAM N/A 11/11/2012   Procedure: INTRAOPERATIVE TRANSESOPHAGEAL ECHOCARDIOGRAM;  Surgeon: Ivin Poot, MD;  Location: [place];  Service: Open Heart Surgery;  Laterality: N/A;  . LEFT HEART CATHETERIZATION WITH CORONARY ANGIOGRAM N/A 11/07/2012   Procedure: LEFT HEART CATHETERIZATION WITH CORONARY ANGIOGRAM;  Surgeon: Thayer Headings, MD;  Location: [facility] CATH LAB;  Service: Cardiovascular;  Laterality: N/A;  . LUMBAR LAMINECTOMY/DECOMPRESSION MICRODISCECTOMY N/A 02/05/2019   Procedure: LUMBAR LAMINECTOMY/DECOMPRESSION L3-L4;  Surgeon: Latanya Maudlin, MD;  [facility];  Service: Orthopedics;  Laterality: N/A;  27min  . LYMPH NODE DISSECTION     Bilateral pelvic  . RETROPUBIC PROSTATECTOMY     Radical    There were no vitals filed for this visit.   Subjective Assessment - 02/23/20 0903    Subjective COVID 19 screening performed on patient upon arrival. Patient reports no big changes but R knee constantly aches.  Pertinent History CKD, CAD, HTN, history of stroke, CABG x4, lumbar laminectomy/decompression microdiscectomy 02/05/2019    Limitations House hold activities;Walking;Standing;Sitting    Patient Stated Goals decrease pain, walk better    Currently in Pain? Yes    Pain Score --   No numerical pain score provided   Pain Location Knee    Pain Orientation Right    Pain Descriptors / Indicators Aching    Pain Type Chronic pain    Pain Onset More than a month ago    Pain Frequency Constant              [facility] PT  Assessment - 02/23/20 0001      Assessment   Medical Diagnosis Gait abnormality    Referring Provider (PT) Margette Fast, MD    Next MD Visit June 01, 2019    Prior Therapy yes for knee      Precautions   Precautions Fall      Restrictions   Weight Bearing Restrictions No                         [facility] Adult PT Treatment/Exercise - 02/23/20 0001      Knee/Hip Exercises: Aerobic   Nustep L3 x18 min      Knee/Hip Exercises: Machines for Strengthening   Cybex Knee Extension 10# 2x10 reps    Cybex Knee Flexion 30# 2x10 reps      Knee/Hip Exercises: Standing   Heel Raises Both;20 reps    Heel Raises Limitations B toe raise x20 reps    Hip Abduction Stengthening;Both;20 reps;Knee bent    Functional Squat 10 reps;3 seconds    Other Standing Knee Exercises semitandem stance 2x30 sec    Other Standing Knee Exercises sidestepping red theraband x3       Knee/Hip Exercises: Seated   Clamshell with TheraBand Red   x20 reps   Sit to Sand 15 reps;with UE support   with red clam for hip abductor strengthening                   PT Short Term Goals - 10/28/19 2032      PT SHORT TERM GOAL #1   Title STG=LTG             PT Long Term Goals - 02/23/20 0957      PT LONG TERM GOAL #1   Title Patient will be independent with HEP.    Time 4    Period Weeks    Status On-going      PT LONG TERM GOAL #2   Title Patient will perform 5x sit to stand test in 15 seconds or less with no UE support to improve balance and improve functional LE strength.    Time 4    Period Weeks    Status On-going      PT LONG TERM GOAL #3   Title Patient will decrease risk of falls as noted by a 4+ improvement on Berg Balance Scale.    Baseline 32/56    Time 4    Period Weeks    Status On-going      PT LONG TERM GOAL #4   Title Patient will demonstrate  4+/5 or greater bilateral LE MMT to improve stability during functional tasks.    Baseline --    Time 4    Period  Weeks    Status On-going  Plan - 02/23/20 0953    Clinical Impression Statement Patient presented in clinic with reports of chronic R knee aching. Patient stated no new changes since last eipsode of PT. Patient progressed through LE strengthening to assist with stability. Patient did require moderate UE support during standing semitandem activity.    Personal Factors and Comorbidities Comorbidity 3+;Age;Time since onset of injury/illness/exacerbation    Comorbidities CKD, HTN, history of stroke, CABG x4, lumbar laminectomy/decompression microdiscectomy 02/05/2019    Examination-Activity Limitations Locomotion Level;Transfers;Stairs;Stand    Stability/Clinical Decision Making Stable/Uncomplicated    Rehab Potential Fair    PT Frequency 2x / week    PT Duration 4 weeks    PT Treatment/Interventions ADLs/Self Care Home Management;Cryotherapy;Electrical Stimulation;Moist Heat;Therapeutic activities;Functional mobility training;Stair training;Gait training;Therapeutic exercise;Balance training;Neuromuscular re-education;Manual techniques;Passive range of motion;Patient/family education;Vasopneumatic Device    PT Next Visit Plan nustep, balance activities and LE strengthening.    PT Home Exercise Plan see patient education section    Consulted and Agree with Plan of Care Patient           Patient will benefit from skilled therapeutic intervention in order to improve the following deficits and impairments:  Abnormal gait, Decreased activity tolerance, Decreased balance, Decreased mobility, Decreased strength, Pain, Difficulty walking, Decreased range of motion  Visit Diagnosis: Repeated falls  Unsteadiness on feet  Difficulty in walking, not elsewhere classified  Muscle weakness (generalized)     Problem List Patient Active Problem List   Diagnosis Date Noted  . [name] affect 02/09/2020  . Iron deficiency anemia due to chronic blood loss 05/27/2019  .  Nonrheumatic mitral valve regurgitation 05/26/2019  . Left anterior fascicular block 05/26/2019  . Educated about COVID-19 virus infection 04/06/2019  . Drug-induced constipation 02/19/2019  . Elevated troponin 02/09/2019  . Fecal occult blood test positive 02/09/2019  . Spinal stenosis, lumbar region with neurogenic claudication 02/05/2019  . Leg swelling 01/26/2019  . Nonrheumatic aortic valve stenosis 01/26/2019  . Multiple joint pain 12/11/2018  . Chronic pain of both knees 11/07/2018  . Vitamin D deficiency 10/23/2018  . Gastroesophageal reflux disease with esophagitis 03/13/2018  . Murmur, cardiac 12/04/2017  . Coronary artery disease involving native coronary artery of native heart without angina pectoris 12/04/2017  . PVC's (premature ventricular contractions) 12/04/2017  . Heart disease 08/21/2017  . Fatigue 11/14/2016  . Abnormality of gait 12/06/2015  . Abdominal aortic atherosclerosis ([place]) 12/01/2015  . Degeneration of lumbar intervertebral disc 12/01/2015  . Exertional angina ([place]) 07/20/2015  . Hernia of abdominal wall 10/12/2014  . Gynecomastia 04/09/2013  . Hx of CABG 02/02/2013  . Myelodysplastic syndrome 11/06/2012  . Vitamin B12 deficiency 07/07/2012  . Hyperlipemia 07/07/2012  . Peripheral vascular disease ([place]) 04/14/2009  . CARCINOMA, PROSTATE 04/13/2009  . Non-thrombocytopenic purpura ([place]) 04/13/2009  . Benign essential hypertension 04/13/2009  . Chronic renal insufficiency, stage III (moderate) ([place]) 04/13/2009  . SLEEP APNEA 04/13/2009    Standley Brooking, PTA 02/23/2020, 9:57 AM  [facility] [address] [place], [place], [address] Phone: [phone]   Fax:  [phone]  Name: CEDRICK PARTAIN MRN: [id number] Date of Birth: 1928/08/30

## 2020-02-25 ENCOUNTER — Encounter: Payer: Self-pay | Admitting: Family Medicine

## 2020-02-25 ENCOUNTER — Other Ambulatory Visit: Payer: Self-pay

## 2020-02-25 ENCOUNTER — Ambulatory Visit (INDEPENDENT_AMBULATORY_CARE_PROVIDER_SITE_OTHER): Payer: MEDICARE | Admitting: Family Medicine

## 2020-02-25 VITALS — BP 130/79 | HR 60 | Temp 96.7°F | Ht 72.0 in | Wt 148.4 lb

## 2020-02-25 DIAGNOSIS — M5136 Other intervertebral disc degeneration, lumbar region: Secondary | ICD-10-CM | POA: Diagnosis not present

## 2020-02-25 DIAGNOSIS — K2100 Gastro-esophageal reflux disease with esophagitis, without bleeding: Secondary | ICD-10-CM | POA: Diagnosis not present

## 2020-02-25 DIAGNOSIS — I2510 Atherosclerotic heart disease of native coronary artery without angina pectoris: Secondary | ICD-10-CM

## 2020-02-25 DIAGNOSIS — I10 Essential (primary) hypertension: Secondary | ICD-10-CM

## 2020-02-25 DIAGNOSIS — F482 Pseudobulbar affect: Secondary | ICD-10-CM | POA: Diagnosis not present

## 2020-02-25 NOTE — Progress Notes (Signed)
Subjective:  Patient ID: Jon Reid, male    DOB: [birth year]  Age: 86 y.o. MRN: [id number]  CC: Follow-up (3 month/)   HPI Jon Reid presents for 91-month follow-up on his chronic pain in the right knee and the right thigh.  Follow-up of hypertension. Patient has no history of headache chest pain or shortness of breath or recent cough. Patient also denies symptoms of TIA such as numbness weakness lateralizing. Patient checks  blood pressure at home and has not had any elevated readings recently. Patient denies side effects from his medication. States taking it regularly.  The patient tells me he went inside and saw neurology and Jon Reid so was prescribed.  However it was too expensive and he has not been able to get it as yet.  He continues to have pseudobulbar symptoms.  Patient in for follow-up of GERD. Currently asymptomatic taking  PPI daily. There is no chest pain or heartburn. No hematemesis and no melena. No dysphagia or choking. Onset is remote. Progression is stable. Complicating factors, none.    Depression screen Jon Reid 2/9 02/25/2020 11/26/2019 10/06/2019  Decreased Interest 0 0 1  Down, Depressed, Hopeless 0 0 0  PHQ - 2 Score 0 0 1  Altered sleeping - - -  Tired, decreased energy - - -  Change in appetite - - -  Feeling bad or failure about yourself  - - -  Trouble concentrating - - -  Moving slowly or fidgety/restless - - -  Suicidal thoughts - - -  PHQ-9 Score - - -  Some recent data might be hidden    History Jon Reid has a past medical history of Arthritis, Carcinoma of prostate (Jon Reid), CKD (chronic kidney disease) stage 2, GFR 60-89 ml/min, Coronary artery disease, First degree AV block (01/28/2019), GERD (gastroesophageal reflux disease), Gout, HTN (hypertension), echocardiogram, Macrocytosis without anemia (01/08/2012), Moderate aortic stenosis (12/06/2017), [name] affect (02/09/2020), Right BBB/left ant fasc block (01/28/2019), S/P CABG x 4 (02/02/2013), Sleep  apnea, Spinal stenosis, and Stroke (Jon Reid).   He has a past surgical history that includes Retropubic prostatectomy; Lymph node dissection; Cardiac catheterization (11/07/2012); Coronary artery bypass graft (N/A, 11/11/2012); Intraoprative transesophageal echocardiogram (N/A, 11/11/2012); left heart catheterization with coronary angiogram (N/A, 11/07/2012); Inguinal hernia repair (Right, 01/10/2017); Insertion of mesh (Right, 01/10/2017); Cataract extraction w/ intraocular lens implant (Bilateral); and Lumbar laminectomy/decompression microdiscectomy (N/A, 02/05/2019).   His family history includes Aneurysm in his father; Cancer in his mother; Hypertension in his father; Parkinson's disease in his son; Stroke in his father.He reports that he has never smoked. He has never used smokeless tobacco. He reports current alcohol use. He reports that he does not use drugs.    ROS Review of Systems  Constitutional: Negative for fever.  Respiratory: Negative for shortness of breath.   Cardiovascular: Negative for chest pain.  Musculoskeletal: Positive for arthralgias.  Skin: Negative for rash.    Objective:  BP 130/79    Pulse 60    Temp (!) 96.7 F (35.9 C) (Temporal)    Ht 6' (1.829 m)    Wt 148 lb 6.4 oz (67.3 kg)    BMI 20.13 kg/m   BP Readings from Last 3 Encounters:  02/25/20 130/79  02/22/20 (!) 164/93  02/09/20 (!) 160/80    Wt Readings from Last 3 Encounters:  02/25/20 148 lb 6.4 oz (67.3 kg)  02/22/20 153 lb 11.2 oz (69.7 kg)  02/09/20 152 lb 8 oz (69.2 kg)     Physical Exam Vitals reviewed.  Constitutional:      Appearance: He is well-developed and well-nourished.  HENT:     Head: Normocephalic and atraumatic.     Right Ear: Tympanic membrane and external ear normal. No decreased hearing noted.     Left Ear: Tympanic membrane and external ear normal. No decreased hearing noted.     Mouth/Throat:     Pharynx: No oropharyngeal exudate or posterior oropharyngeal erythema.  Eyes:      Pupils: Pupils are equal, round, and reactive to light.  Cardiovascular:     Rate and Rhythm: Normal rate and regular rhythm.     Heart sounds: No murmur heard.   Pulmonary:     Effort: No respiratory distress.     Breath sounds: Normal breath sounds.  Abdominal:     General: Bowel sounds are normal.     Palpations: Abdomen is soft. There is no mass.     Tenderness: There is no abdominal tenderness.  Musculoskeletal:        General: Tenderness present.     Cervical back: Normal range of motion and neck supple.       Assessment & Plan:   Jon Reid was seen today for follow-up.  Diagnoses and all orders for this visit:  Benign essential hypertension  [name] affect -     Dextromethorphan-quiNIDine (NUEDEXTA) 20-10 MG capsule; Take 1 capsule by mouth daily.  Gastroesophageal reflux disease with esophagitis without hemorrhage -     pantoprazole (PROTONIX) 40 MG tablet; TAKE 1 TABLET ONCE DAILY FOR REFLUX  Degeneration of lumbar intervertebral disc -     celecoxib (CELEBREX) 200 MG capsule; Take 1 capsule (200 mg total) by mouth daily. With food       I have discontinued Jon Reid's naproxen. I am also having him start on celecoxib. Additionally, I am having him maintain his fluticasone, nitroGLYCERIN, aspirin EC, Vitamin D, triamcinolone, diclofenac sodium, furosemide, Iron (Ferrous Sulfate), pantoprazole, and Nuedexta.  Allergies as of 02/25/2020      Reactions   Crestor [rosuvastatin]    GYNECOMASTIA AND SWELLING      Medication List       Accurate as of February 25, 2020 11:59 PM. If you have any questions, ask your nurse or doctor.        STOP taking these medications   naproxen 500 MG tablet Commonly known as: Naprosyn Stopped by: Jon Fraise, MD     TAKE these medications   aspirin EC 81 MG tablet Take 81 mg by mouth daily.   celecoxib 200 MG capsule Commonly known as: CeleBREX Take 1 capsule (200 mg total) by mouth daily. With  food Started by: Jon Fraise, MD   diclofenac sodium 1 % Gel Commonly known as: VOLTAREN Apply 2 g topically 4 (four) times daily.   fluticasone 50 MCG/ACT nasal spray Commonly known as: FLONASE One to 2 sprays each nostril at bedtime   furosemide 40 MG tablet Commonly known as: LASIX Take 1 tablet (40 mg total) by mouth daily.   Iron (Ferrous Sulfate) 325 (65 Fe) MG Tabs Take 1 tablet by mouth every Monday, Wednesday, and Friday.   nitroGLYCERIN 0.4 MG SL tablet Commonly known as: NITROSTAT Place 1 tablet (0.4 mg total) under the tongue every 5 (five) minutes as needed for chest pain.   Nuedexta 20-10 MG capsule Generic drug: Dextromethorphan-quiNIDine Take 1 capsule by mouth daily.   pantoprazole 40 MG tablet Commonly known as: PROTONIX TAKE 1 TABLET ONCE DAILY FOR REFLUX   triamcinolone 0.1 %  Commonly known as: KENALOG Apply 1 application topically 2 (two) times daily. What changed:   when to take this  reasons to take this   Vitamin D 50 MCG (2000 UT) Caps Take 2,000 Units by mouth daily.        Follow-up: No follow-ups on file.  Jon Reid, M.D.

## 2020-02-26 ENCOUNTER — Ambulatory Visit: Payer: Medicare Other | Admitting: *Deleted

## 2020-02-26 ENCOUNTER — Other Ambulatory Visit: Payer: Self-pay

## 2020-02-26 DIAGNOSIS — M6281 Muscle weakness (generalized): Secondary | ICD-10-CM

## 2020-02-26 DIAGNOSIS — R296 Repeated falls: Secondary | ICD-10-CM

## 2020-02-26 DIAGNOSIS — R262 Difficulty in walking, not elsewhere classified: Secondary | ICD-10-CM

## 2020-02-26 DIAGNOSIS — R2681 Unsteadiness on feet: Secondary | ICD-10-CM

## 2020-02-26 NOTE — Therapy (Signed)
[facility] [place], [place], [address] Phone: [phone]   Fax:  [phone]  Physical Therapy Treatment  Patient Details  Name: Jon Reid MRN: [id number] Date of Birth: 07/06/1928 Referring Provider (PT): Margette Fast, MD   Encounter Date: 02/26/2020   PT End of Session - 02/26/20 1040    Visit Number 3    Number of Visits 8    Date for PT Re-Evaluation 03/25/20    Authorization Type Progress note after 10th visit    PT Start Time 0900    PT Stop Time 0949    PT Time Calculation (min) 49 min           Past Medical History:  Diagnosis Date  . Arthritis   . Carcinoma of prostate [facility])    prostate  . CKD (chronic kidney disease) stage 2, GFR 60-89 ml/min   . Coronary artery disease    a. LHC (8/14):  3v CAD => s/p CABG (LIMA-LAD, SVG-OM1, SVG-PDA)  . First degree AV block 01/28/2019   Noted on EKG  . GERD (gastroesophageal reflux disease)   . Gout   . HTN (hypertension)   . Hx of echocardiogram    Echocardiogram 11/13/12: EF 55-60%, normal wall motion, biatrial enlargement, mild AI, mild TR, no pericardial effusion  . Macrocytosis without anemia 01/08/2012  . Moderate aortic stenosis 12/06/2017   Noted on ECHO  . [name] affect 02/09/2020  . 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 Sleep apnea    Questionable  . Spinal stenosis   . Stroke [facility])     Past Surgical History:  Procedure Laterality Date  . CARDIAC CATHETERIZATION  11/07/2012   Dr Acie Fredrickson  . CATARACT EXTRACTION W/ INTRAOCULAR LENS IMPLANT Bilateral   . CORONARY ARTERY BYPASS GRAFT N/A 11/11/2012   Procedure: CORONARY ARTERY BYPASS GRAFTING times four using Right Greater Saphenous Vein Graft harvested endoscopically and Left Internal Mammary Artery.;  Surgeon: Ivin Poot, MD;  Location: [place];  Service: Open Heart Surgery;  Laterality: N/A;  . INGUINAL HERNIA REPAIR Right  01/10/2017   Procedure: OPEN RIGHT HERNIA REPAIR INGUINAL;  Surgeon: Ileana Roup, MD;  Location: [facility];  Service: General;  Laterality: Right;  . INSERTION OF MESH Right 01/10/2017   Procedure: INSERTION OF MESH;  Surgeon: Ileana Roup, MD;  Location: [facility];  Service: General;  Laterality: Right;  . INTRAOPERATIVE TRANSESOPHAGEAL ECHOCARDIOGRAM N/A 11/11/2012   Procedure: INTRAOPERATIVE TRANSESOPHAGEAL ECHOCARDIOGRAM;  Surgeon: Ivin Poot, MD;  Location: [place];  Service: Open Heart Surgery;  Laterality: N/A;  . LEFT HEART CATHETERIZATION WITH CORONARY ANGIOGRAM N/A 11/07/2012   Procedure: LEFT HEART CATHETERIZATION WITH CORONARY ANGIOGRAM;  Surgeon: Thayer Headings, MD;  Location: [facility] CATH LAB;  Service: Cardiovascular;  Laterality: N/A;  . LUMBAR LAMINECTOMY/DECOMPRESSION MICRODISCECTOMY N/A 02/05/2019   Procedure: LUMBAR LAMINECTOMY/DECOMPRESSION L3-L4;  Surgeon: Latanya Maudlin, MD;  Location: [facility];  Service: Orthopedics;  Laterality: N/A;  56min  . LYMPH NODE DISSECTION     Bilateral pelvic  . RETROPUBIC PROSTATECTOMY     Radical    There were no vitals filed for this visit.   Subjective Assessment - 02/26/20 0900    Subjective COVID 19 screening performed on patient upon arrival. Patient reports no big changes but R knee constantly aches.    Pertinent History CKD, CAD, HTN, history of stroke, CABG x4, lumbar laminectomy/decompression microdiscectomy 02/05/2019  Limitations House hold activities;Walking;Standing;Sitting    Patient Stated Goals decrease pain, walk better    Currently in Pain? Yes    Pain Score 4     Pain Location Knee    Pain Orientation Right    Pain Descriptors / Indicators Aching                             [facility] Adult PT Treatment/Exercise - 02/26/20 0001      Knee/Hip Exercises: Aerobic   Nustep L3 x18 min      Knee/Hip Exercises: Standing   Heel Raises Both;20 reps    Heel Raises Limitations B toe raise x20  reps    Forward Lunges 2 sets;10 reps;Right    Step Down Right;2 sets;10 reps;Step Height: 4"    Rocker Board 3 minutes      Knee/Hip Exercises: Seated   Long Arc Quad Strengthening;Right;3 sets;10 reps    Long Arc Quad Weight 2 lbs.    Hamstring Curl Strengthening;Right;3 sets   red band                   PT Short Term Goals - 10/28/19 2032      PT SHORT TERM GOAL #1   Title STG=LTG             PT Long Term Goals - 02/23/20 0957      PT LONG TERM GOAL #1   Title Patient will be independent with HEP.    Time 4    Period Weeks    Status On-going      PT LONG TERM GOAL #2   Title Patient will perform 5x sit to stand test in 15 seconds or less with no UE support to improve balance and improve functional LE strength.    Time 4    Period Weeks    Status On-going      PT LONG TERM GOAL #3   Title Patient will decrease risk of falls as noted by a 4+ improvement on Berg Balance Scale.    Baseline 32/56    Time 4    Period Weeks    Status On-going      PT LONG TERM GOAL #4   Title Patient will demonstrate  4+/5 or greater bilateral LE MMT to improve stability during functional tasks.    Baseline --    Time 4    Period Weeks    Status On-going                 Plan - 02/26/20 0936    Clinical Impression Statement Pt arrrived today doing fairly well and reports tightness in RT knee. Rx focused on RT LE strengthening in standing as well as sitting. He reports end of session that his knee felt better with less tightness    Personal Factors and Comorbidities Comorbidity 3+;Age;Time since onset of injury/illness/exacerbation    Comorbidities CKD, HTN, history of stroke, CABG x4, lumbar laminectomy/decompression microdiscectomy 02/05/2019    Stability/Clinical Decision Making Stable/Uncomplicated    Rehab Potential Fair    PT Frequency 2x / week    PT Treatment/Interventions ADLs/Self Care Home Management;Cryotherapy;Electrical Stimulation;Moist  Heat;Therapeutic activities;Functional mobility training;Stair training;Gait training;Therapeutic exercise;Balance training;Neuromuscular re-education;Manual techniques;Passive range of motion;Patient/family education;Vasopneumatic Device    PT Next Visit Plan nustep, balance activities and LE strengthening.    PT Home Exercise Plan see patient education section           Patient  will benefit from skilled therapeutic intervention in order to improve the following deficits and impairments:  Abnormal gait,Decreased activity tolerance,Decreased balance,Decreased mobility,Decreased strength,Pain,Difficulty walking,Decreased range of motion  Visit Diagnosis: Repeated falls  Unsteadiness on feet  Difficulty in walking, not elsewhere classified  Muscle weakness (generalized)     Problem List Patient Active Problem List   Diagnosis Date Noted  . [name] affect 02/09/2020  . Iron deficiency anemia due to chronic blood loss 05/27/2019  . Nonrheumatic mitral valve regurgitation 05/26/2019  . Left anterior fascicular block 05/26/2019  . Educated about COVID-19 virus infection 04/06/2019  . Drug-induced constipation 02/19/2019  . Elevated troponin 02/09/2019  . Fecal occult blood test positive 02/09/2019  . Spinal stenosis, lumbar region with neurogenic claudication 02/05/2019  . Leg swelling 01/26/2019  . Nonrheumatic aortic valve stenosis 01/26/2019  . Multiple joint pain 12/11/2018  . Chronic pain of both knees 11/07/2018  . Vitamin D deficiency 10/23/2018  . Gastroesophageal reflux disease with esophagitis 03/13/2018  . Murmur, cardiac 12/04/2017  . Coronary artery disease involving native coronary artery of native heart without angina pectoris 12/04/2017  . PVC's (premature ventricular contractions) 12/04/2017  . Heart disease 08/21/2017  . Fatigue 11/14/2016  . Abnormality of gait 12/06/2015  . Abdominal aortic atherosclerosis ([place]) 12/01/2015  . Degeneration of lumbar  intervertebral disc 12/01/2015  . Exertional angina ([place]) 07/20/2015  . Hernia of abdominal wall 10/12/2014  . Gynecomastia 04/09/2013  . Hx of CABG 02/02/2013  . Myelodysplastic syndrome 11/06/2012  . Vitamin B12 deficiency 07/07/2012  . Hyperlipemia 07/07/2012  . Peripheral vascular disease ([place]) 04/14/2009  . CARCINOMA, PROSTATE 04/13/2009  . Non-thrombocytopenic purpura ([place]) 04/13/2009  . Benign essential hypertension 04/13/2009  . Chronic renal insufficiency, stage III (moderate) ([place]) 04/13/2009  . SLEEP APNEA 04/13/2009    Derril Franek,CHRIS, PTA 02/26/2020, 12:23 PM  [facility] [place], [place], [address] Phone: [phone]   Fax:  [phone]  Name: Jon Reid MRN: [id number] Date of Birth: 06-13-1928

## 2020-02-28 ENCOUNTER — Encounter: Payer: Self-pay | Admitting: Family Medicine

## 2020-02-28 MED ORDER — PANTOPRAZOLE SODIUM 40 MG PO TBEC: DELAYED_RELEASE_TABLET | ORAL | 1 refills | Status: DC

## 2020-02-28 MED ORDER — CELECOXIB 200 MG PO CAPS: 200.0000 mg | ORAL_CAPSULE | Freq: Every day | ORAL | 5 refills | Status: DC

## 2020-02-28 MED ORDER — NUEDEXTA 20-10 MG PO CAPS: 1.0000 | ORAL_CAPSULE | Freq: Every day | ORAL | 3 refills | Status: DC

## 2020-03-02 ENCOUNTER — Encounter: Payer: Self-pay | Admitting: Physical Therapy

## 2020-03-02 ENCOUNTER — Other Ambulatory Visit: Payer: Self-pay

## 2020-03-02 ENCOUNTER — Ambulatory Visit: Payer: Medicare Other | Admitting: Physical Therapy

## 2020-03-02 DIAGNOSIS — R296 Repeated falls: Secondary | ICD-10-CM

## 2020-03-02 DIAGNOSIS — R262 Difficulty in walking, not elsewhere classified: Secondary | ICD-10-CM

## 2020-03-02 DIAGNOSIS — R2681 Unsteadiness on feet: Secondary | ICD-10-CM

## 2020-03-02 DIAGNOSIS — M6281 Muscle weakness (generalized): Secondary | ICD-10-CM

## 2020-03-02 NOTE — Therapy (Signed)
[facility] [place], [place], [address] Phone: [phone]   Fax:  [phone]  Physical Therapy Treatment  Patient Details  Name: Jon Reid MRN: [id number] Date of Birth: 1928-05-22 Referring Provider (PT): Margette Fast, MD   Encounter Date: 03/02/2020   PT End of Session - 03/02/20 1040    Visit Number 4    Number of Visits 8    Date for PT Re-Evaluation 03/25/20    Authorization Type Progress note after 10th visit    PT Start Time 0946    PT Stop Time 1027    PT Time Calculation (min) 41 min    Activity Tolerance Patient tolerated treatment well    Behavior During Therapy [facility] for tasks assessed/performed           Past Medical History:  Diagnosis Date  . Arthritis   . Carcinoma of prostate [facility])    prostate  . CKD (chronic kidney disease) stage 2, GFR 60-89 ml/min   . Coronary artery disease    a. LHC (8/14):  3v CAD => s/p CABG (LIMA-LAD, SVG-OM1, SVG-PDA)  . First degree AV block 01/28/2019   Noted on EKG  . GERD (gastroesophageal reflux disease)   . Gout   . HTN (hypertension)   . Hx of echocardiogram    Echocardiogram 11/13/12: EF 55-60%, normal wall motion, biatrial enlargement, mild AI, mild TR, no pericardial effusion  . Macrocytosis without anemia 01/08/2012  . Moderate aortic stenosis 12/06/2017   Noted on ECHO  . [name] affect 02/09/2020  . 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 Sleep apnea    Questionable  . Spinal stenosis   . Stroke [facility])     Past Surgical History:  Procedure Laterality Date  . CARDIAC CATHETERIZATION  11/07/2012   Dr Acie Fredrickson  . CATARACT EXTRACTION W/ INTRAOCULAR LENS IMPLANT Bilateral   . CORONARY ARTERY BYPASS GRAFT N/A 11/11/2012   Procedure: CORONARY ARTERY BYPASS GRAFTING times four using Right Greater Saphenous Vein Graft harvested endoscopically and Left Internal Mammary Artery.;  Surgeon: Ivin Poot, MD;  Location: [place];  Service: Open Heart Surgery;  Laterality: N/A;  . INGUINAL HERNIA REPAIR Right 01/10/2017   Procedure: OPEN RIGHT HERNIA REPAIR INGUINAL;  Surgeon: Ileana Roup, MD;  Location[facility];  Service: General;  Laterality: Right;  . INSERTION OF MESH Right 01/10/2017   Procedure: INSERTION OF MESH;  Surgeon: Ileana Roup, MD;  [facility];  Service: General;  Laterality: Right;  . INTRAOPERATIVE TRANSESOPHAGEAL ECHOCARDIOGRAM N/A 11/11/2012   Procedure: INTRAOPERATIVE TRANSESOPHAGEAL ECHOCARDIOGRAM;  Surgeon: Ivin Poot, MD;  Location: [place];  Service: Open Heart Surgery;  Laterality: N/A;  . LEFT HEART CATHETERIZATION WITH CORONARY ANGIOGRAM N/A 11/07/2012   Procedure: LEFT HEART CATHETERIZATION WITH CORONARY ANGIOGRAM;  Surgeon: Thayer Headings, MD;  Location: [facility] CATH LAB;  Service: Cardiovascular;  Laterality: N/A;  . LUMBAR LAMINECTOMY/DECOMPRESSION MICRODISCECTOMY N/A 02/05/2019   Procedure: LUMBAR LAMINECTOMY/DECOMPRESSION L3-L4;  Surgeon: Latanya Maudlin, MD;  Location[facility];  Service: Orthopedics;  Laterality: N/A;  78min  . LYMPH NODE DISSECTION     Bilateral pelvic  . RETROPUBIC PROSTATECTOMY     Radical    There were no vitals filed for this visit.   Subjective Assessment - 03/02/20 1005    Subjective COVID 19 screening performed on patient upon arrival. Patient reports no big changes but R knee constantly aches.  Pertinent History CKD, CAD, HTN, history of stroke, CABG x4, lumbar laminectomy/decompression microdiscectomy 02/05/2019    Limitations House hold activities;Walking;Standing;Sitting    Patient Stated Goals decrease pain, walk better    Currently in Pain? Yes    Pain Location Knee    Pain Orientation Right    Pain Descriptors / Indicators Aching    Pain Type Chronic pain    Pain Onset More than a month ago    Pain Frequency Constant              [facility] PT Assessment - 03/02/20 0001      Assessment    Medical Diagnosis Gait abnormality    Referring Provider (PT) Margette Fast, MD    Next MD Visit June 01, 2019    Prior Therapy yes for knee      Precautions   Precautions Fall      Restrictions   Weight Bearing Restrictions No                         [facility] Adult PT Treatment/Exercise - 03/02/20 0001      Knee/Hip Exercises: Aerobic   Nustep L4 x18 min      Knee/Hip Exercises: Machines for Strengthening   Cybex Knee Extension 10# 3x10 reps    Cybex Knee Flexion 30# 3x10 reps      Knee/Hip Exercises: Standing   Heel Raises Both;20 reps    Heel Raises Limitations B toe raise x20 reps    Hip Flexion Stengthening;Both;2 sets;10 reps;Knee bent    Hip Abduction Stengthening;Both;2 sets;10 reps;Knee straight    Hip Extension Stengthening;Both;2 sets;10 reps;Knee bent    Forward Step Up Both;3 sets;10 reps;Hand Hold: 2;Step Height: 6"    Other Standing Knee Exercises sidestepping red theraband x3 RT      Knee/Hip Exercises: Seated   Clamshell with TheraBand Red   x20 reps   Sit to Sand 15 reps;with UE support   with red therband for hip abductor strengthening                   PT Short Term Goals - 10/28/19 2032      PT SHORT TERM GOAL #1   Title STG=LTG             PT Long Term Goals - 02/23/20 0957      PT LONG TERM GOAL #1   Title Patient will be independent with HEP.    Time 4    Period Weeks    Status On-going      PT LONG TERM GOAL #2   Title Patient will perform 5x sit to stand test in 15 seconds or less with no UE support to improve balance and improve functional LE strength.    Time 4    Period Weeks    Status On-going      PT LONG TERM GOAL #3   Title Patient will decrease risk of falls as noted by a 4+ improvement on Berg Balance Scale.    Baseline 32/56    Time 4    Period Weeks    Status On-going      PT LONG TERM GOAL #4   Title Patient will demonstrate  4+/5 or greater bilateral LE MMT to improve stability during  functional tasks.    Baseline --    Time 4    Period Weeks    Status On-going  Plan - 03/02/20 1041    Clinical Impression Statement Patient presented in clinic with reports of R knee aching. Patient guided through resisted and antigravity hip/knee strengthening without complaint of increased pain. Patient observed standing with knee flexion in stance and trunk flexion. Patient reported by end of treatment that his R knee felt "better."    Personal Factors and Comorbidities Comorbidity 3+;Age;Time since onset of injury/illness/exacerbation    Comorbidities CKD, HTN, history of stroke, CABG x4, lumbar laminectomy/decompression microdiscectomy 02/05/2019    Examination-Activity Limitations Locomotion Level;Transfers;Stairs;Stand    Stability/Clinical Decision Making Stable/Uncomplicated    Rehab Potential Fair    PT Frequency 2x / week    PT Duration 4 weeks    PT Treatment/Interventions ADLs/Self Care Home Management;Cryotherapy;Electrical Stimulation;Moist Heat;Therapeutic activities;Functional mobility training;Stair training;Gait training;Therapeutic exercise;Balance training;Neuromuscular re-education;Manual techniques;Passive range of motion;Patient/family education;Vasopneumatic Device    PT Next Visit Plan nustep, balance activities and LE strengthening.    PT Home Exercise Plan see patient education section    Consulted and Agree with Plan of Care Patient           Patient will benefit from skilled therapeutic intervention in order to improve the following deficits and impairments:  Abnormal gait,Decreased activity tolerance,Decreased balance,Decreased mobility,Decreased strength,Pain,Difficulty walking,Decreased range of motion  Visit Diagnosis: Repeated falls  Unsteadiness on feet  Difficulty in walking, not elsewhere classified  Muscle weakness (generalized)     Problem List Patient Active Problem List   Diagnosis Date Noted  . [name]  affect 02/09/2020  . Iron deficiency anemia due to chronic blood loss 05/27/2019  . Nonrheumatic mitral valve regurgitation 05/26/2019  . Left anterior fascicular block 05/26/2019  . Educated about COVID-19 virus infection 04/06/2019  . Drug-induced constipation 02/19/2019  . Elevated troponin 02/09/2019  . Fecal occult blood test positive 02/09/2019  . Spinal stenosis, lumbar region with neurogenic claudication 02/05/2019  . Leg swelling 01/26/2019  . Nonrheumatic aortic valve stenosis 01/26/2019  . Multiple joint pain 12/11/2018  . Chronic pain of both knees 11/07/2018  . Vitamin D deficiency 10/23/2018  . Gastroesophageal reflux disease with esophagitis 03/13/2018  . Murmur, cardiac 12/04/2017  . Coronary artery disease involving native coronary artery of native heart without angina pectoris 12/04/2017  . PVC's (premature ventricular contractions) 12/04/2017  . Heart disease 08/21/2017  . Fatigue 11/14/2016  . Abnormality of gait 12/06/2015  . Abdominal aortic atherosclerosis ([place]) 12/01/2015  . Degeneration of lumbar intervertebral disc 12/01/2015  . Exertional angina ([place]) 07/20/2015  . Hernia of abdominal wall 10/12/2014  . Gynecomastia 04/09/2013  . Hx of CABG 02/02/2013  . Myelodysplastic syndrome 11/06/2012  . Vitamin B12 deficiency 07/07/2012  . Hyperlipemia 07/07/2012  . Peripheral vascular disease ([place]) 04/14/2009  . CARCINOMA, PROSTATE 04/13/2009  . Non-thrombocytopenic purpura ([place]) 04/13/2009  . Benign essential hypertension 04/13/2009  . Chronic renal insufficiency, stage III (moderate) ([place]) 04/13/2009  . SLEEP APNEA 04/13/2009    Standley Brooking, PTA 03/02/2020, 10:47 AM  [facility] [address] [place], [place], [address] Phone: [phone]   Fax:  [phone]  Name: Jon Reid MRN: [id number] Date of Birth: [birth year]

## 2020-03-04 ENCOUNTER — Other Ambulatory Visit: Payer: Self-pay

## 2020-03-04 ENCOUNTER — Encounter: Payer: Self-pay | Admitting: Physical Therapy

## 2020-03-04 ENCOUNTER — Ambulatory Visit: Payer: Medicare Other | Admitting: Physical Therapy

## 2020-03-04 DIAGNOSIS — R296 Repeated falls: Secondary | ICD-10-CM | POA: Diagnosis not present

## 2020-03-04 DIAGNOSIS — R2681 Unsteadiness on feet: Secondary | ICD-10-CM

## 2020-03-04 DIAGNOSIS — R262 Difficulty in walking, not elsewhere classified: Secondary | ICD-10-CM

## 2020-03-04 DIAGNOSIS — M6281 Muscle weakness (generalized): Secondary | ICD-10-CM

## 2020-03-04 NOTE — Therapy (Signed)
[facility] [place], [place], [address] Phone: [phone]   Fax:  [phone]  Physical Therapy Treatment  Patient Details  Name: Jon Reid MRN: [id number] Date of Birth: [birth year] Referring Provider (PT): Margette Fast, MD   Encounter Date: 03/04/2020   PT End of Session - 03/04/20 1021    Visit Number 5    Number of Visits 8    Date for PT Re-Evaluation 03/25/20    Authorization Type Progress note after 10th visit    PT Start Time 0901    PT Stop Time 0942    PT Time Calculation (min) 41 min    Activity Tolerance Patient tolerated treatment well    Behavior During Therapy [facility] for tasks assessed/performed           Past Medical History:  Diagnosis Date  . Arthritis   . Carcinoma of prostate [facility])    prostate  . CKD (chronic kidney disease) stage 2, GFR 60-89 ml/min   . Coronary artery disease    a. LHC (8/14):  3v CAD => s/p CABG (LIMA-LAD, SVG-OM1, SVG-PDA)  . First degree AV block 01/28/2019   Noted on EKG  . GERD (gastroesophageal reflux disease)   . Gout   . HTN (hypertension)   . Hx of echocardiogram    Echocardiogram 11/13/12: EF 55-60%, normal wall motion, biatrial enlargement, mild AI, mild TR, no pericardial effusion  . Macrocytosis without anemia 01/08/2012  . Moderate aortic stenosis 12/06/2017   Noted on ECHO  . [name] affect 02/09/2020  . 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 Sleep apnea    Questionable  . Spinal stenosis   . Stroke [facility])     Past Surgical History:  Procedure Laterality Date  . CARDIAC CATHETERIZATION  11/07/2012   Dr Acie Fredrickson  . CATARACT EXTRACTION W/ INTRAOCULAR LENS IMPLANT Bilateral   . CORONARY ARTERY BYPASS GRAFT N/A 11/11/2012   Procedure: CORONARY ARTERY BYPASS GRAFTING times four using Right Greater Saphenous Vein Graft harvested endoscopically and Left Internal Mammary Artery.;  Surgeon: Ivin Poot, MD;  Location: [place];  Service: Open Heart Surgery;  Laterality: N/A;  . INGUINAL HERNIA REPAIR Right 01/10/2017   Procedure: OPEN RIGHT HERNIA REPAIR INGUINAL;  Surgeon: Ileana Roup, MD;  [facility];  Service: General;  Laterality: Right;  . INSERTION OF MESH Right 01/10/2017   Procedure: INSERTION OF MESH;  Surgeon: Ileana Roup, MD;  [facility];  Service: General;  Laterality: Right;  . INTRAOPERATIVE TRANSESOPHAGEAL ECHOCARDIOGRAM N/A 11/11/2012   Procedure: INTRAOPERATIVE TRANSESOPHAGEAL ECHOCARDIOGRAM;  Surgeon: Ivin Poot, MD;  Location: [place];  Service: Open Heart Surgery;  Laterality: N/A;  . LEFT HEART CATHETERIZATION WITH CORONARY ANGIOGRAM N/A 11/07/2012   Procedure: LEFT HEART CATHETERIZATION WITH CORONARY ANGIOGRAM;  Surgeon: Thayer Headings, MD;  Location: [facility] CATH LAB;  Service: Cardiovascular;  Laterality: N/A;  . LUMBAR LAMINECTOMY/DECOMPRESSION MICRODISCECTOMY N/A 02/05/2019   Procedure: LUMBAR LAMINECTOMY/DECOMPRESSION L3-L4;  Surgeon: Latanya Maudlin, MD;  [facility];  Service: Orthopedics;  Laterality: N/A;  68min  . LYMPH NODE DISSECTION     Bilateral pelvic  . RETROPUBIC PROSTATECTOMY     Radical    There were no vitals filed for this visit.   Subjective Assessment - 03/04/20 0904    Subjective COVID 19 screening performed on patient upon arrival. Patient reports R knee flares up sometimes.  Pertinent History CKD, CAD, HTN, history of stroke, CABG x4, lumbar laminectomy/decompression microdiscectomy 02/05/2019    Limitations House hold activities;Walking;Standing;Sitting    Patient Stated Goals decrease pain, walk better    Currently in Pain? Other (Comment)   No pain assessment provided             [facility] PT Assessment - 03/04/20 0001      Assessment   Medical Diagnosis Gait abnormality    Referring Provider (PT) Margette Fast, MD    Next MD Visit May 31, 2020      Precautions   Precautions Fall       Restrictions   Weight Bearing Restrictions No                         [facility] Adult PT Treatment/Exercise - 03/04/20 0001      Knee/Hip Exercises: Aerobic   Nustep L4 x19 min      Knee/Hip Exercises: Machines for Strengthening   Cybex Knee Extension 10# 3x10 reps    Cybex Knee Flexion 30# 3x10 reps      Knee/Hip Exercises: Standing   Heel Raises Both;20 reps    Heel Raises Limitations B toe raise x20 reps    Hip Flexion Stengthening;Both;2 sets;10 reps;Knee bent    Hip Abduction Stengthening;Both;2 sets;10 reps;Knee straight    Hip Extension Stengthening;Both;2 sets;10 reps;Knee bent    Functional Squat 20 reps;3 seconds      Knee/Hip Exercises: Seated   Sit to Sand 20 reps;with UE support   minimal UE support                   PT Short Term Goals - 10/28/19 2032      PT SHORT TERM GOAL #1   Title STG=LTG             PT Long Term Goals - 02/23/20 0957      PT LONG TERM GOAL #1   Title Patient will be independent with HEP.    Time 4    Period Weeks    Status On-going      PT LONG TERM GOAL #2   Title Patient will perform 5x sit to stand test in 15 seconds or less with no UE support to improve balance and improve functional LE strength.    Time 4    Period Weeks    Status On-going      PT LONG TERM GOAL #3   Title Patient will decrease risk of falls as noted by a 4+ improvement on Berg Balance Scale.    Baseline 32/56    Time 4    Period Weeks    Status On-going      PT LONG TERM GOAL #4   Title Patient will demonstrate  4+/5 or greater bilateral LE MMT to improve stability during functional tasks.    Baseline --    Time 4    Period Weeks    Status On-going                 Plan - 03/04/20 1022    Clinical Impression Statement Patient presented in clinic with reports of R knee flare ups but no pain assessment provided. Patient guided through more standing and functional training and strengthening. Patient able to use less  UE support for sit/stands but able to complete functional squats with minimal UE support well. Patient had no complaints upon end of treatment.    Personal Factors and  Comorbidities Comorbidity 3+;Age;Time since onset of injury/illness/exacerbation    Comorbidities CKD, HTN, history of stroke, CABG x4, lumbar laminectomy/decompression microdiscectomy 02/05/2019    Examination-Activity Limitations Locomotion Level;Transfers;Stairs;Stand    Stability/Clinical Decision Making Stable/Uncomplicated    Rehab Potential Fair    PT Frequency 2x / week    PT Duration 4 weeks    PT Treatment/Interventions ADLs/Self Care Home Management;Cryotherapy;Electrical Stimulation;Moist Heat;Therapeutic activities;Functional mobility training;Stair training;Gait training;Therapeutic exercise;Balance training;Neuromuscular re-education;Manual techniques;Passive range of motion;Patient/family education;Vasopneumatic Device    PT Next Visit Plan nustep, balance activities and LE strengthening.    PT Home Exercise Plan see patient education section    Consulted and Agree with Plan of Care Patient           Patient will benefit from skilled therapeutic intervention in order to improve the following deficits and impairments:  Abnormal gait,Decreased activity tolerance,Decreased balance,Decreased mobility,Decreased strength,Pain,Difficulty walking,Decreased range of motion  Visit Diagnosis: Repeated falls  Unsteadiness on feet  Difficulty in walking, not elsewhere classified  Muscle weakness (generalized)     Problem List Patient Active Problem List   Diagnosis Date Noted  . [name] affect 02/09/2020  . Iron deficiency anemia due to chronic blood loss 05/27/2019  . Nonrheumatic mitral valve regurgitation 05/26/2019  . Left anterior fascicular block 05/26/2019  . Educated about COVID-19 virus infection 04/06/2019  . Drug-induced constipation 02/19/2019  . Elevated troponin 02/09/2019  . Fecal occult  blood test positive 02/09/2019  . Spinal stenosis, lumbar region with neurogenic claudication 02/05/2019  . Leg swelling 01/26/2019  . Nonrheumatic aortic valve stenosis 01/26/2019  . Multiple joint pain 12/11/2018  . Chronic pain of both knees 11/07/2018  . Vitamin D deficiency 10/23/2018  . Gastroesophageal reflux disease with esophagitis 03/13/2018  . Murmur, cardiac 12/04/2017  . Coronary artery disease involving native coronary artery of native heart without angina pectoris 12/04/2017  . PVC's (premature ventricular contractions) 12/04/2017  . Heart disease 08/21/2017  . Fatigue 11/14/2016  . Abnormality of gait 12/06/2015  . Abdominal aortic atherosclerosis ([place]) 12/01/2015  . Degeneration of lumbar intervertebral disc 12/01/2015  . Exertional angina ([place]) 07/20/2015  . Hernia of abdominal wall 10/12/2014  . Gynecomastia 04/09/2013  . Hx of CABG 02/02/2013  . Myelodysplastic syndrome 11/06/2012  . Vitamin B12 deficiency 07/07/2012  . Hyperlipemia 07/07/2012  . Peripheral vascular disease ([place]) 04/14/2009  . CARCINOMA, PROSTATE 04/13/2009  . Non-thrombocytopenic purpura ([place]) 04/13/2009  . Benign essential hypertension 04/13/2009  . Chronic renal insufficiency, stage III (moderate) ([place]) 04/13/2009  . SLEEP APNEA 04/13/2009    Standley Brooking, PTA 03/04/2020, 10:27 AM  [facility] [address] [place], [place], [address] Phone: [phone]   Fax:  [phone]  Name: ASHDEN SONNENBERG MRN: [id number] Date of Birth: 1928/08/09

## 2020-03-09 ENCOUNTER — Ambulatory Visit: Payer: Medicare Other | Admitting: Physical Therapy

## 2020-03-09 ENCOUNTER — Other Ambulatory Visit: Payer: Self-pay

## 2020-03-09 DIAGNOSIS — M6281 Muscle weakness (generalized): Secondary | ICD-10-CM

## 2020-03-09 DIAGNOSIS — R2681 Unsteadiness on feet: Secondary | ICD-10-CM

## 2020-03-09 DIAGNOSIS — R296 Repeated falls: Secondary | ICD-10-CM | POA: Diagnosis not present

## 2020-03-09 DIAGNOSIS — R262 Difficulty in walking, not elsewhere classified: Secondary | ICD-10-CM

## 2020-03-09 NOTE — Therapy (Signed)
[facility] [place], [place], [address] Phone: [phone]   Fax:  [phone]  Physical Therapy Treatment  Patient Details  Name: Jon Reid MRN: [id number] Date of Birth: 12/21/1928 Referring Provider (PT): Margette Fast, MD   Encounter Date: 03/09/2020   PT End of Session - 03/09/20 1001    Visit Number 6    Number of Visits 8    Date for PT Re-Evaluation 03/25/20    Authorization Type Progress note after 10th visit    PT Start Time 0946    PT Stop Time 1028    PT Time Calculation (min) 42 min    Activity Tolerance Patient tolerated treatment well    Behavior During Therapy [facility] for tasks assessed/performed           Past Medical History:  Diagnosis Date  . Arthritis   . Carcinoma of prostate [facility])    prostate  . CKD (chronic kidney disease) stage 2, GFR 60-89 ml/min   . Coronary artery disease    a. LHC (8/14):  3v CAD => s/p CABG (LIMA-LAD, SVG-OM1, SVG-PDA)  . First degree AV block 01/28/2019   Noted on EKG  . GERD (gastroesophageal reflux disease)   . Gout   . HTN (hypertension)   . Hx of echocardiogram    Echocardiogram 11/13/12: EF 55-60%, normal wall motion, biatrial enlargement, mild AI, mild TR, no pericardial effusion  . Macrocytosis without anemia 01/08/2012  . Moderate aortic stenosis 12/06/2017   Noted on ECHO  . [name] affect 02/09/2020  . 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 Sleep apnea    Questionable  . Spinal stenosis   . Stroke [facility])     Past Surgical History:  Procedure Laterality Date  . CARDIAC CATHETERIZATION  11/07/2012   Dr Acie Fredrickson  . CATARACT EXTRACTION W/ INTRAOCULAR LENS IMPLANT Bilateral   . CORONARY ARTERY BYPASS GRAFT N/A 11/11/2012   Procedure: CORONARY ARTERY BYPASS GRAFTING times four using Right Greater Saphenous Vein Graft harvested endoscopically and Left Internal Mammary Artery.;  Surgeon: Ivin Poot, MD;  Location: [place];  Service: Open Heart Surgery;  Laterality: N/A;  . INGUINAL HERNIA REPAIR Right 01/10/2017   Procedure: OPEN RIGHT HERNIA REPAIR INGUINAL;  Surgeon: Ileana Roup, MD;  [facility];  Service: General;  Laterality: Right;  . INSERTION OF MESH Right 01/10/2017   Procedure: INSERTION OF MESH;  Surgeon: Ileana Roup, MD;  [facility];  Service: General;  Laterality: Right;  . INTRAOPERATIVE TRANSESOPHAGEAL ECHOCARDIOGRAM N/A 11/11/2012   Procedure: INTRAOPERATIVE TRANSESOPHAGEAL ECHOCARDIOGRAM;  Surgeon: Ivin Poot, MD;  Location: [place];  Service: Open Heart Surgery;  Laterality: N/A;  . LEFT HEART CATHETERIZATION WITH CORONARY ANGIOGRAM N/A 11/07/2012   Procedure: LEFT HEART CATHETERIZATION WITH CORONARY ANGIOGRAM;  Surgeon: Thayer Headings, MD;  Location: [facility] CATH LAB;  Service: Cardiovascular;  Laterality: N/A;  . LUMBAR LAMINECTOMY/DECOMPRESSION MICRODISCECTOMY N/A 02/05/2019   Procedure: LUMBAR LAMINECTOMY/DECOMPRESSION L3-L4;  Surgeon: Latanya Maudlin, MD;  [facility];  Service: Orthopedics;  Laterality: N/A;  12min  . LYMPH NODE DISSECTION     Bilateral pelvic  . RETROPUBIC PROSTATECTOMY     Radical    There were no vitals filed for this visit.   Subjective Assessment - 03/09/20 1001    Subjective COVID 19 screening performed on patient upon arrival. Patient arrived feeling more LE strength and is able to  get up easier.    Pertinent History CKD, CAD, HTN, history of stroke, CABG x4, lumbar laminectomy/decompression microdiscectomy 02/05/2019    Limitations House hold activities;Walking;Standing;Sitting    Patient Stated Goals decrease pain, walk better    Currently in Pain? No/denies              [facility] PT Assessment - 03/09/20 0001      Assessment   Medical Diagnosis Gait abnormality    Referring Provider (PT) Margette Fast, MD    Next MD Visit May 31, 2020      Precautions   Precautions Fall       Restrictions   Weight Bearing Restrictions No                         [facility] Adult PT Treatment/Exercise - 03/09/20 0001      Knee/Hip Exercises: Aerobic   Nustep L4 x17 min      Knee/Hip Exercises: Machines for Strengthening   Cybex Knee Extension 10# 3x10 reps    Cybex Knee Flexion 30# 3x10 reps      Knee/Hip Exercises: Standing   Hip Flexion Stengthening;Both;2 sets;10 reps;Knee bent    Hip Flexion Limitations 2#    Hip Abduction Stengthening;Both;2 sets;10 reps;Knee straight    Abduction Limitations 2#    Hip Extension Stengthening;Both;2 sets;10 reps;Knee bent    Extension Limitations 2#               Balance Exercises - 03/09/20 0001      Balance Exercises: Standing   Standing Eyes Opened Narrow base of support (BOS);Wide (BOA);30 secs;4 reps;Foam/compliant surface   each   Balance Beam lateral stepping x3 minutes with bilateral UE support    Marching Foam/compliant surface;Upper extremity assist 1;Static;20 reps   alternating UE support   Heel Raises 20 reps   on foam              PT Short Term Goals - 10/28/19 2032      PT SHORT TERM GOAL #1   Title STG=LTG             PT Long Term Goals - 02/23/20 0957      PT LONG TERM GOAL #1   Title Patient will be independent with HEP.    Time 4    Period Weeks    Status On-going      PT LONG TERM GOAL #2   Title Patient will perform 5x sit to stand test in 15 seconds or less with no UE support to improve balance and improve functional LE strength.    Time 4    Period Weeks    Status On-going      PT LONG TERM GOAL #3   Title Patient will decrease risk of falls as noted by a 4+ improvement on Berg Balance Scale.    Baseline 32/56    Time 4    Period Weeks    Status On-going      PT LONG TERM GOAL #4   Title Patient will demonstrate  4+/5 or greater bilateral LE MMT to improve stability during functional tasks.    Baseline --    Time 4    Period Weeks    Status On-going                  Plan - 03/09/20 1035    Clinical Impression Statement Patient arrived doing well with no reports falls. Patient responded well to with  standing TEs with verbal cuing for form. Patient demonstrated good use of ankle strategies duirng balance activities with close supervision.  Patient reported feeling sore at end of session but states he's always sore.    Personal Factors and Comorbidities Comorbidity 3+;Age;Time since onset of injury/illness/exacerbation    Comorbidities CKD, HTN, history of stroke, CABG x4, lumbar laminectomy/decompression microdiscectomy 02/05/2019    Examination-Activity Limitations Locomotion Level;Transfers;Stairs;Stand    Stability/Clinical Decision Making Stable/Uncomplicated    Clinical Decision Making Low    Rehab Potential Fair    PT Frequency 2x / week    PT Duration 4 weeks    PT Treatment/Interventions ADLs/Self Care Home Management;Cryotherapy;Electrical Stimulation;Moist Heat;Therapeutic activities;Functional mobility training;Stair training;Gait training;Therapeutic exercise;Balance training;Neuromuscular re-education;Manual techniques;Passive range of motion;Patient/family education;Vasopneumatic Device    PT Next Visit Plan nustep, balance activities and LE strengthening.    PT Home Exercise Plan see patient education section    Consulted and Agree with Plan of Care Patient           Patient will benefit from skilled therapeutic intervention in order to improve the following deficits and impairments:  Abnormal gait,Decreased activity tolerance,Decreased balance,Decreased mobility,Decreased strength,Pain,Difficulty walking,Decreased range of motion  Visit Diagnosis: Repeated falls  Unsteadiness on feet  Difficulty in walking, not elsewhere classified  Muscle weakness (generalized)     Problem List Patient Active Problem List   Diagnosis Date Noted  . [name] affect 02/09/2020  . Iron deficiency anemia due to chronic blood  loss 05/27/2019  . Nonrheumatic mitral valve regurgitation 05/26/2019  . Left anterior fascicular block 05/26/2019  . Educated about COVID-19 virus infection 04/06/2019  . Drug-induced constipation 02/19/2019  . Elevated troponin 02/09/2019  . Fecal occult blood test positive 02/09/2019  . Spinal stenosis, lumbar region with neurogenic claudication 02/05/2019  . Leg swelling 01/26/2019  . Nonrheumatic aortic valve stenosis 01/26/2019  . Multiple joint pain 12/11/2018  . Chronic pain of both knees 11/07/2018  . Vitamin D deficiency 10/23/2018  . Gastroesophageal reflux disease with esophagitis 03/13/2018  . Murmur, cardiac 12/04/2017  . Coronary artery disease involving native coronary artery of native heart without angina pectoris 12/04/2017  . PVC's (premature ventricular contractions) 12/04/2017  . Heart disease 08/21/2017  . Fatigue 11/14/2016  . Abnormality of gait 12/06/2015  . Abdominal aortic atherosclerosis ([place]) 12/01/2015  . Degeneration of lumbar intervertebral disc 12/01/2015  . Exertional angina ([place]) 07/20/2015  . Hernia of abdominal wall 10/12/2014  . Gynecomastia 04/09/2013  . Hx of CABG 02/02/2013  . Myelodysplastic syndrome 11/06/2012  . Vitamin B12 deficiency 07/07/2012  . Hyperlipemia 07/07/2012  . Peripheral vascular disease ([place]) 04/14/2009  . CARCINOMA, PROSTATE 04/13/2009  . Non-thrombocytopenic purpura ([place]) 04/13/2009  . Benign essential hypertension 04/13/2009  . Chronic renal insufficiency, stage III (moderate) ([place]) 04/13/2009  . SLEEP APNEA 04/13/2009    Gabriela Eves, PT, DPT 03/09/2020, 10:44 AM  [facility] [address] [place], [place], [address] Phone: [phone]   Fax:  [phone]  Name: Jon Reid MRN: [id number] Date of Birth: [birth year]

## 2020-03-16 ENCOUNTER — Encounter: Payer: Self-pay | Admitting: Physical Therapy

## 2020-03-16 ENCOUNTER — Other Ambulatory Visit: Payer: Self-pay

## 2020-03-16 ENCOUNTER — Ambulatory Visit: Payer: Medicare Other | Admitting: Physical Therapy

## 2020-03-16 DIAGNOSIS — R2681 Unsteadiness on feet: Secondary | ICD-10-CM

## 2020-03-16 DIAGNOSIS — R262 Difficulty in walking, not elsewhere classified: Secondary | ICD-10-CM

## 2020-03-16 DIAGNOSIS — M6281 Muscle weakness (generalized): Secondary | ICD-10-CM

## 2020-03-16 DIAGNOSIS — R296 Repeated falls: Secondary | ICD-10-CM

## 2020-03-16 NOTE — Therapy (Signed)
[facility] [place], [place], [address] Phone: [phone]   Fax:  [phone]  Physical Therapy Treatment  Patient Details  Name: Jon Reid MRN: [id number] Date of Birth: 14-Mar-1929 Referring Provider (PT): Margette Fast, MD   Encounter Date: 03/16/2020   PT End of Session - 03/16/20 0957    Visit Number 7    Number of Visits 8    Date for PT Re-Evaluation 03/25/20    Authorization Type Progress note after 10th visit    PT Start Time 0947    PT Stop Time 1030    PT Time Calculation (min) 43 min    Activity Tolerance Patient tolerated treatment well    Behavior During Therapy [facility] for tasks assessed/performed           Past Medical History:  Diagnosis Date  . Arthritis   . Carcinoma of prostate [facility])    prostate  . CKD (chronic kidney disease) stage 2, GFR 60-89 ml/min   . Coronary artery disease    a. LHC (8/14):  3v CAD => s/p CABG (LIMA-LAD, SVG-OM1, SVG-PDA)  . First degree AV block 01/28/2019   Noted on EKG  . GERD (gastroesophageal reflux disease)   . Gout   . HTN (hypertension)   . Hx of echocardiogram    Echocardiogram 11/13/12: EF 55-60%, normal wall motion, biatrial enlargement, mild AI, mild TR, no pericardial effusion  . Macrocytosis without anemia 01/08/2012  . Moderate aortic stenosis 12/06/2017   Noted on ECHO  . [name] affect 02/09/2020  . 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 Sleep apnea    Questionable  . Spinal stenosis   . Stroke [facility])     Past Surgical History:  Procedure Laterality Date  . CARDIAC CATHETERIZATION  11/07/2012   Dr Acie Fredrickson  . CATARACT EXTRACTION W/ INTRAOCULAR LENS IMPLANT Bilateral   . CORONARY ARTERY BYPASS GRAFT N/A 11/11/2012   Procedure: CORONARY ARTERY BYPASS GRAFTING times four using Right Greater Saphenous Vein Graft harvested endoscopically and Left Internal Mammary Artery.;  Surgeon: Ivin Poot, MD;  Location: [place];  Service: Open Heart Surgery;  Laterality: N/A;  . INGUINAL HERNIA REPAIR Right 01/10/2017   Procedure: OPEN RIGHT HERNIA REPAIR INGUINAL;  Surgeon: Ileana Roup, MD;  [facility];  Service: General;  Laterality: Right;  . INSERTION OF MESH Right 01/10/2017   Procedure: INSERTION OF MESH;  Surgeon: Ileana Roup, MD;  [facility];  Service: General;  Laterality: Right;  . INTRAOPERATIVE TRANSESOPHAGEAL ECHOCARDIOGRAM N/A 11/11/2012   Procedure: INTRAOPERATIVE TRANSESOPHAGEAL ECHOCARDIOGRAM;  Surgeon: Ivin Poot, MD;  Location: [facility];  Service: Open Heart Surgery;  Laterality: N/A;  . LEFT HEART CATHETERIZATION WITH CORONARY ANGIOGRAM N/A 11/07/2012   Procedure: LEFT HEART CATHETERIZATION WITH CORONARY ANGIOGRAM;  Surgeon: Thayer Headings, MD;  Location: [facility] CATH LAB;  Service: Cardiovascular;  Laterality: N/A;  . LUMBAR LAMINECTOMY/DECOMPRESSION MICRODISCECTOMY N/A 02/05/2019   Procedure: LUMBAR LAMINECTOMY/DECOMPRESSION L3-L4;  Surgeon: Latanya Maudlin, MD;  [facility];  Service: Orthopedics;  Laterality: N/A;  30mn  . LYMPH NODE DISSECTION     Bilateral pelvic  . RETROPUBIC PROSTATECTOMY     Radical    There were no vitals filed for this visit.   Subjective Assessment - 03/16/20 0956    Subjective COVID 19 screening performed on patient upon arrival. Patient reports R knee pain today.    Pertinent  History CKD, CAD, HTN, history of stroke, CABG x4, lumbar laminectomy/decompression microdiscectomy 02/05/2019    Limitations House hold activities;Walking;Standing;Sitting    Patient Stated Goals decrease pain, walk better    Currently in Pain? Yes    Pain Location Knee    Pain Orientation Right    Pain Descriptors / Indicators Discomfort    Pain Type Chronic pain    Pain Onset More than a month ago    Pain Frequency Constant              [facility] PT Assessment - 03/16/20 0001      Assessment   Medical Diagnosis Gait  abnormality    Referring Provider (PT) Margette Fast, MD    Next MD Visit May 31, 2020    Prior Therapy yes for knee      Precautions   Precautions Fall      Restrictions   Weight Bearing Restrictions No                         [facility] Adult PT Treatment/Exercise - 03/16/20 0001      Standardized Balance Assessment   Standardized Balance Assessment Berg Balance Test      Berg Balance Test   Sit to Stand Able to stand without using hands and stabilize independently    Standing Unsupported Able to stand safely 2 minutes    Sitting with Back Unsupported but Feet Supported on Floor or Stool Able to sit safely and securely 2 minutes    Stand to Sit Sits safely with minimal use of hands    Transfers Able to transfer safely, minor use of hands    Standing Unsupported with Eyes Closed Able to stand 10 seconds safely    Standing Ubsupported with Feet Together Able to place feet together independently and stand 1 minute safely    From Standing, Reach Forward with Outstretched Arm Can reach forward >12 cm safely (5")    From Standing Position, Pick up Object from Floor Able to pick up shoe safely and easily    From Standing Position, Turn to Look Behind Over each Shoulder Turn sideways only but maintains balance    Turn 360 Degrees Able to turn 360 degrees safely one side only in 4 seconds or less    Standing Unsupported, Alternately Place Feet on Step/Stool Able to complete 4 steps without aid or supervision    Standing Unsupported, One Foot in Front Able to take small step independently and hold 30 seconds    Standing on One Leg Tries to lift leg/unable to hold 3 seconds but remains standing independently    Total Score 45      Knee/Hip Exercises: Aerobic   Nustep L4 x16 min      Knee/Hip Exercises: Machines for Strengthening   Cybex Knee Extension 10# 3x10 reps    Cybex Knee Flexion 30# 3x10 reps      Knee/Hip Exercises: Standing   Heel Raises Both;20 reps   on airex      Knee/Hip Exercises: Seated   Sit to Sand 5 reps;without UE support   10 sec              Balance Exercises - 03/16/20 0001      Balance Exercises: Standing   Sidestepping Foam/compliant support;Upper extremity support;5 reps               PT Short Term Goals - 10/28/19 2032      PT SHORT  TERM GOAL #1   Title STG=LTG             PT Long Term Goals - 03/16/20 1003      PT LONG TERM GOAL #1   Title Patient will be independent with HEP.    Time 4    Period Weeks    Status Achieved      PT LONG TERM GOAL #2   Title Patient will perform 5x sit to stand test in 15 seconds or less with no UE support to improve balance and improve functional LE strength.    Time 4    Period Weeks    Status Achieved      PT LONG TERM GOAL #3   Title Patient will decrease risk of falls as noted by a 4+ improvement on Berg Balance Scale.    Baseline 32/56    Time 4    Period Weeks    Status Achieved   32/65-45/56 on 03/16/2020     PT LONG TERM GOAL #4   Title Patient will demonstrate  4+/5 or greater bilateral LE MMT to improve stability during functional tasks.    Baseline --    Time 4    Period Weeks    Status On-going                 Plan - 03/16/20 1044    Clinical Impression Statement Patient has made great improvement regarding LE strength and upon goal assessment, has met most LTGs. Patient able to surpass 4+ point BERG score today with total score of 45/56. Patient reports sit<> stands from chair or couch has gotten easier due to LE strength as well.    Personal Factors and Comorbidities Comorbidity 3+;Age;Time since onset of injury/illness/exacerbation    Comorbidities CKD, HTN, history of stroke, CABG x4, lumbar laminectomy/decompression microdiscectomy 02/05/2019    Examination-Activity Limitations Locomotion Level;Transfers;Stairs;Stand    Stability/Clinical Decision Making Stable/Uncomplicated    Rehab Potential Fair    PT Frequency 2x / week    PT  Duration 4 weeks    PT Treatment/Interventions ADLs/Self Care Home Management;Cryotherapy;Electrical Stimulation;Moist Heat;Therapeutic activities;Functional mobility training;Stair training;Gait training;Therapeutic exercise;Balance training;Neuromuscular re-education;Manual techniques;Passive range of motion;Patient/family education;Vasopneumatic Device    PT Next Visit Plan nustep, balance activities and LE strengthening.    PT Home Exercise Plan see patient education section    Consulted and Agree with Plan of Care Patient           Patient will benefit from skilled therapeutic intervention in order to improve the following deficits and impairments:  Abnormal gait,Decreased activity tolerance,Decreased balance,Decreased mobility,Decreased strength,Pain,Difficulty walking,Decreased range of motion  Visit Diagnosis: Repeated falls  Unsteadiness on feet  Difficulty in walking, not elsewhere classified  Muscle weakness (generalized)     Problem List Patient Active Problem List   Diagnosis Date Noted  . [name] affect 02/09/2020  . Iron deficiency anemia due to chronic blood loss 05/27/2019  . Nonrheumatic mitral valve regurgitation 05/26/2019  . Left anterior fascicular block 05/26/2019  . Educated about COVID-19 virus infection 04/06/2019  . Drug-induced constipation 02/19/2019  . Elevated troponin 02/09/2019  . Fecal occult blood test positive 02/09/2019  . Spinal stenosis, lumbar region with neurogenic claudication 02/05/2019  . Leg swelling 01/26/2019  . Nonrheumatic aortic valve stenosis 01/26/2019  . Multiple joint pain 12/11/2018  . Chronic pain of both knees 11/07/2018  . Vitamin D deficiency 10/23/2018  . Gastroesophageal reflux disease with esophagitis 03/13/2018  . Murmur, cardiac 12/04/2017  . Coronary artery  disease involving native coronary artery of native heart without angina pectoris 12/04/2017  . PVC's (premature ventricular contractions) 12/04/2017   . Heart disease 08/21/2017  . Fatigue 11/14/2016  . Abnormality of gait 12/06/2015  . Abdominal aortic atherosclerosis ([place]) 12/01/2015  . Degeneration of lumbar intervertebral disc 12/01/2015  . Exertional angina ([place]) 07/20/2015  . Hernia of abdominal wall 10/12/2014  . Gynecomastia 04/09/2013  . Hx of CABG 02/02/2013  . Myelodysplastic syndrome 11/06/2012  . Vitamin B12 deficiency 07/07/2012  . Hyperlipemia 07/07/2012  . Peripheral vascular disease ([place]) 04/14/2009  . CARCINOMA, PROSTATE 04/13/2009  . Non-thrombocytopenic purpura ([place]) 04/13/2009  . Benign essential hypertension 04/13/2009  . Chronic renal insufficiency, stage III (moderate) ([place]) 04/13/2009  . SLEEP APNEA 04/13/2009    Standley Brooking, PTA 03/16/2020, 10:47 AM  [facility] [address] [place], [place], [address] Phone: [phone]   Fax:  [phone]  Name: Jon Reid MRN: [id number] Date of Birth: 13-Mar-1929

## 2020-03-21 ENCOUNTER — Ambulatory Visit: Payer: Medicare Other | Attending: Orthopedic Surgery | Admitting: Physical Therapy

## 2020-03-21 ENCOUNTER — Other Ambulatory Visit: Payer: Self-pay

## 2020-03-21 DIAGNOSIS — R296 Repeated falls: Secondary | ICD-10-CM

## 2020-03-21 DIAGNOSIS — R2681 Unsteadiness on feet: Secondary | ICD-10-CM | POA: Insufficient documentation

## 2020-03-21 DIAGNOSIS — M6281 Muscle weakness (generalized): Secondary | ICD-10-CM | POA: Diagnosis not present

## 2020-03-21 DIAGNOSIS — R262 Difficulty in walking, not elsewhere classified: Secondary | ICD-10-CM

## 2020-03-21 NOTE — Therapy (Signed)
[facility] [place], [place], [address] Phone: [phone]   Fax:  [phone]  Physical Therapy Treatment PHYSICAL THERAPY DISCHARGE SUMMARY  Visits from Start of Care: 8  Current functional level related to goals / functional outcomes: See below   Remaining deficits: See goals   Education / Equipment: HEP Plan: Patient agrees to discharge.  Patient goals were partially met. Patient is being discharged due to being pleased with the current functional level.  ?????   Gabriela Eves, PT, DPT     Patient Details  Name: Jon Reid MRN: [id number] Date of Birth: 1928/06/05 Referring Provider (PT): Margette Fast, MD   Encounter Date: 03/21/2020   PT End of Session - 03/21/20 0953    Visit Number 8    Number of Visits 8    Date for PT Re-Evaluation 03/25/20    Authorization Type Progress note after 10th visit    PT Start Time 0945    PT Stop Time 1025    PT Time Calculation (min) 40 min    Activity Tolerance Patient tolerated treatment well    Behavior During Therapy [facility] for tasks assessed/performed           Past Medical History:  Diagnosis Date  . Arthritis   . Carcinoma of prostate [facility])    prostate  . CKD (chronic kidney disease) stage 2, GFR 60-89 ml/min   . Coronary artery disease    a. LHC (8/14):  3v CAD => s/p CABG (LIMA-LAD, SVG-OM1, SVG-PDA)  . First degree AV block 01/28/2019   Noted on EKG  . GERD (gastroesophageal reflux disease)   . Gout   . HTN (hypertension)   . Hx of echocardiogram    Echocardiogram 11/13/12: EF 55-60%, normal wall motion, biatrial enlargement, mild AI, mild TR, no pericardial effusion  . Macrocytosis without anemia 01/08/2012  . Moderate aortic stenosis 12/06/2017   Noted on ECHO  . [name] affect 02/09/2020  . 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 Sleep apnea    Questionable  . Spinal stenosis   .  Stroke [facility])     Past Surgical History:  Procedure Laterality Date  . CARDIAC CATHETERIZATION  11/07/2012   Dr Acie Fredrickson  . CATARACT EXTRACTION W/ INTRAOCULAR LENS IMPLANT Bilateral   . CORONARY ARTERY BYPASS GRAFT N/A 11/11/2012   Procedure: CORONARY ARTERY BYPASS GRAFTING times four using Right Greater Saphenous Vein Graft harvested endoscopically and Left Internal Mammary Artery.;  Surgeon: Ivin Poot, MD;  Location: [place];  Service: Open Heart Surgery;  Laterality: N/A;  . INGUINAL HERNIA REPAIR Right 01/10/2017   Procedure: OPEN RIGHT HERNIA REPAIR INGUINAL;  Surgeon: Ileana Roup, MD;  [facility];  Service: General;  Laterality: Right;  . INSERTION OF MESH Right 01/10/2017   Procedure: INSERTION OF MESH;  Surgeon: Ileana Roup, MD;  [facility];  Service: General;  Laterality: Right;  . INTRAOPERATIVE TRANSESOPHAGEAL ECHOCARDIOGRAM N/A 11/11/2012   Procedure: INTRAOPERATIVE TRANSESOPHAGEAL ECHOCARDIOGRAM;  Surgeon: Ivin Poot, MD;  Location: [place];  Service: Open Heart Surgery;  Laterality: N/A;  . LEFT HEART CATHETERIZATION WITH CORONARY ANGIOGRAM N/A 11/07/2012   Procedure: LEFT HEART CATHETERIZATION WITH CORONARY ANGIOGRAM;  Surgeon: Thayer Headings, MD;  Location: [facility] CATH LAB;  Service: Cardiovascular;  Laterality: N/A;  . LUMBAR LAMINECTOMY/DECOMPRESSION MICRODISCECTOMY N/A 02/05/2019   Procedure: LUMBAR LAMINECTOMY/DECOMPRESSION L3-L4;  Surgeon: Latanya Maudlin, MD;  Location:  [facility];  Service: Orthopedics;  Laterality: N/A;  46mn  . LYMPH NODE DISSECTION     Bilateral pelvic  . RETROPUBIC PROSTATECTOMY     Radical    There were no vitals filed for this visit.   Subjective Assessment - 03/21/20 0951    Subjective COVID 19 screening performed on patient upon arrival. Patient arrived with some ongoing RT knee discomfort    Pertinent History CKD, CAD, HTN, history of stroke, CABG x4, lumbar laminectomy/decompression microdiscectomy 02/05/2019     Limitations House hold activities;Walking;Standing;Sitting    Patient Stated Goals decrease pain, walk better    Currently in Pain? Yes    Pain Score 4     Pain Location Knee    Pain Orientation Right    Pain Descriptors / Indicators Discomfort    Pain Type Chronic pain    Pain Onset More than a month ago    Pain Frequency Constant    Aggravating Factors  pressure    Pain Relieving Factors at rest              [facility] P[facility] Assessment - 03/21/20 0001      Strength   Right Hip ABduction 4/5    Right Hip ADduction 4-/5    Left Hip Flexion 4-/5    Left Hip ABduction 4/5    Left Hip ADduction 4-/5    Right Knee Flexion 4+/5    Right Knee Extension 4+/5    Left Knee Flexion 4+/5    Left Knee Extension 4+/5                         [facility] Adult PT Treatment/Exercise - 03/21/20 0001      Knee/Hip Exercises: Aerobic   Nustep L4 x16 min      Knee/Hip Exercises: Machines for Strengthening   Cybex Knee Extension 10# 3x10 reps    Cybex Knee Flexion 30# 3x10 reps      Knee/Hip Exercises: Standing   Heel Raises Both;20 reps    Hip Abduction Stengthening;Both;2 sets;10 reps;Knee straight    Abduction Limitations 2#    Hip Extension Stengthening;Both;2 sets;10 reps;Knee bent    Extension Limitations 2#    Forward Step Up Both;3 sets;10 reps;Hand Hold: 2;Step Height: 6"      Knee/Hip Exercises: Seated   Sit to Sand 10 reps;without UE support;with UE support                    PT Short Term Goals - 10/28/19 2032      PT SHORT TERM GOAL #1   Title STG=LTG             PT Long Term Goals - 03/21/20 0953      PT LONG TERM GOAL #1   Title Patient will be independent with HEP.    Time 4    Period Weeks    Status Achieved      PT LONG TERM GOAL #2   Title Patient will perform 5x sit to stand test in 15 seconds or less with no UE support to improve balance and improve functional LE strength.    Time 4    Period Weeks    Status Achieved      PT  LONG TERM GOAL #3   Title Patient will decrease risk of falls as noted by a 4+ improvement on Berg Balance Scale.    Baseline 32/56    Time 4    Period  Weeks    Status Achieved      PT LONG TERM GOAL #4   Title Patient will demonstrate  4+/5 or greater bilateral LE MMT to improve stability during functional tasks.    Baseline see flow sheet    Time 4    Period Weeks    Status Partially Met                 Plan - 03/21/20 1018    Clinical Impression Statement Patient tolerated treatment well today. Patient met all current goals except strength goal partial met. Paient will cont to do HEP and F/U with MD.    Personal Factors and Comorbidities Comorbidity 3+;Age;Time since onset of injury/illness/exacerbation    Comorbidities CKD, HTN, history of stroke, CABG x4, lumbar laminectomy/decompression microdiscectomy 02/05/2019    Examination-Activity Limitations Locomotion Level;Transfers;Stairs;Stand    Stability/Clinical Decision Making Stable/Uncomplicated    Rehab Potential Fair    PT Frequency 2x / week    PT Duration 4 weeks    PT Treatment/Interventions ADLs/Self Care Home Management;Cryotherapy;Electrical Stimulation;Moist Heat;Therapeutic activities;Functional mobility training;Stair training;Gait training;Therapeutic exercise;Balance training;Neuromuscular re-education;Manual techniques;Passive range of motion;Patient/family education;Vasopneumatic Device    PT Next Visit Plan DC    Consulted and Agree with Plan of Care Patient           Patient will benefit from skilled therapeutic intervention in order to improve the following deficits and impairments:  Abnormal gait,Decreased activity tolerance,Decreased balance,Decreased mobility,Decreased strength,Pain,Difficulty walking,Decreased range of motion  Visit Diagnosis: Repeated falls  Unsteadiness on feet  Difficulty in walking, not elsewhere classified  Muscle weakness (generalized)     Problem List Patient  Active Problem List   Diagnosis Date Noted  . [name] affect 02/09/2020  . Iron deficiency anemia due to chronic blood loss 05/27/2019  . Nonrheumatic mitral valve regurgitation 05/26/2019  . Left anterior fascicular block 05/26/2019  . Educated about COVID-19 virus infection 04/06/2019  . Drug-induced constipation 02/19/2019  . Elevated troponin 02/09/2019  . Fecal occult blood test positive 02/09/2019  . Spinal stenosis, lumbar region with neurogenic claudication 02/05/2019  . Leg swelling 01/26/2019  . Nonrheumatic aortic valve stenosis 01/26/2019  . Multiple joint pain 12/11/2018  . Chronic pain of both knees 11/07/2018  . Vitamin D deficiency 10/23/2018  . Gastroesophageal reflux disease with esophagitis 03/13/2018  . Murmur, cardiac 12/04/2017  . Coronary artery disease involving native coronary artery of native heart without angina pectoris 12/04/2017  . PVC's (premature ventricular contractions) 12/04/2017  . Heart disease 08/21/2017  . Fatigue 11/14/2016  . Abnormality of gait 12/06/2015  . Abdominal aortic atherosclerosis ([place]) 12/01/2015  . Degeneration of lumbar intervertebral disc 12/01/2015  . Exertional angina ([place]) 07/20/2015  . Hernia of abdominal wall 10/12/2014  . Gynecomastia 04/09/2013  . Hx of CABG 02/02/2013  . Myelodysplastic syndrome 11/06/2012  . Vitamin B12 deficiency 07/07/2012  . Hyperlipemia 07/07/2012  . Peripheral vascular disease ([place]) 04/14/2009  . CARCINOMA, PROSTATE 04/13/2009  . Non-thrombocytopenic purpura ([place]) 04/13/2009  . Benign essential hypertension 04/13/2009  . Chronic renal insufficiency, stage III (moderate) ([place]) 04/13/2009  . SLEEP APNEA 04/13/2009    Ladean Raya, PTA 03/21/20 10:32 AM   [facility] [place], [place], [address] Phone: [phone]   Fax:  [phone]  Name: JERELLE VIRDEN MRN: [id number] Date of Birth: 02/15/1929

## 2020-03-23 ENCOUNTER — Encounter: Payer: MEDICARE | Admitting: Physical Therapy

## 2020-05-31 ENCOUNTER — Ambulatory Visit (INDEPENDENT_AMBULATORY_CARE_PROVIDER_SITE_OTHER): Payer: MEDICARE | Admitting: Neurology

## 2020-05-31 ENCOUNTER — Encounter: Payer: Self-pay | Admitting: Neurology

## 2020-05-31 VITALS — BP 171/65 | HR 67 | Ht 72.0 in | Wt 159.6 lb

## 2020-05-31 DIAGNOSIS — R269 Unspecified abnormalities of gait and mobility: Secondary | ICD-10-CM

## 2020-05-31 DIAGNOSIS — F482 Pseudobulbar affect: Secondary | ICD-10-CM | POA: Diagnosis not present

## 2020-05-31 MED ORDER — CARBIDOPA-LEVODOPA 25-100 MG PO TABS
ORAL_TABLET | ORAL | 3 refills | Status: DC
Start: 2020-05-31 — End: 2020-09-21

## 2020-05-31 NOTE — Patient Instructions (Signed)
We will start sinemet (carbidopa) for the walking.  Sinemet (carbidopa) may result in confusion or hallucinations, drowsiness, nausea, or dizziness. If any significant side effects are noted, please contact our office. Sinemet may not be well absorbed when taken with high protein meals, if tolerated it is best to take 30-45 minutes before you eat.

## 2020-05-31 NOTE — Progress Notes (Signed)
Reason for visit: Gait disorder  Jon Reid is an 87 y.o. male  History of present illness:  [name] is a 87 year old right-handed white male with a history of a chronic gait disorder.  The patient had spinal stenosis and underwent surgery but his gait has continued to deteriorate.  He has reported some mild tremor with the right arm and some micrographia, he has been followed for developing signs of parkinsonism.  He also reports a pseudobulbar affect that has occurred, he indicates that he has had a 10-year history of decreased sensation of smell.  He was placed on Nuedexta, but he cannot afford the medication.  The patient returns for an evaluation.  He has undergone physical therapy for his walking but continues to fall.  He reports that he is losing weight, he states that his appetite is good but he has continued to have ongoing weight loss.  Past Medical History:  Diagnosis Date  . Arthritis   . Carcinoma of prostate [facility])    prostate  . CKD (chronic kidney disease) stage 2, GFR 60-89 ml/min   . Coronary artery disease    a. LHC (8/14):  3v CAD => s/p CABG (LIMA-LAD, SVG-OM1, SVG-PDA)  . First degree AV block 01/28/2019   Noted on EKG  . GERD (gastroesophageal reflux disease)   . Gout   . HTN (hypertension)   . Hx of echocardiogram    Echocardiogram 11/13/12: EF 55-60%, normal wall motion, biatrial enlargement, mild AI, mild TR, no pericardial effusion  . Macrocytosis without anemia 01/08/2012  . Moderate aortic stenosis 12/06/2017   Noted on ECHO  . [name] affect 02/09/2020  . 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 Sleep apnea    Questionable  . Spinal stenosis   . Stroke [facility])     Past Surgical History:  Procedure Laterality Date  . CARDIAC CATHETERIZATION  11/07/2012   Dr Acie Fredrickson  . CATARACT EXTRACTION W/ INTRAOCULAR LENS IMPLANT Bilateral   . CORONARY ARTERY BYPASS GRAFT N/A 11/11/2012   Procedure:  CORONARY ARTERY BYPASS GRAFTING times four using Right Greater Saphenous Vein Graft harvested endoscopically and Left Internal Mammary Artery.;  Surgeon: Ivin Poot, MD;  Location: [place];  Service: Open Heart Surgery;  Laterality: N/A;  . INGUINAL HERNIA REPAIR Right 01/10/2017   Procedure: OPEN RIGHT HERNIA REPAIR INGUINAL;  Surgeon: Ileana Roup, MD;  [facility];  Service: General;  Laterality: Right;  . INSERTION OF MESH Right 01/10/2017   Procedure: INSERTION OF MESH;  Surgeon: Ileana Roup, MD;  [facility];  Service: General;  Laterality: Right;  . INTRAOPERATIVE TRANSESOPHAGEAL ECHOCARDIOGRAM N/A 11/11/2012   Procedure: INTRAOPERATIVE TRANSESOPHAGEAL ECHOCARDIOGRAM;  Surgeon: Ivin Poot, MD;  Location: [place];  Service: Open Heart Surgery;  Laterality: N/A;  . LEFT HEART CATHETERIZATION WITH CORONARY ANGIOGRAM N/A 11/07/2012   Procedure: LEFT HEART CATHETERIZATION WITH CORONARY ANGIOGRAM;  Surgeon: Thayer Headings, MD;  Location: [facility] CATH LAB;  Service: Cardiovascular;  Laterality: N/A;  . LUMBAR LAMINECTOMY/DECOMPRESSION MICRODISCECTOMY N/A 02/05/2019   Procedure: LUMBAR LAMINECTOMY/DECOMPRESSION L3-L4;  Surgeon: Latanya Maudlin, MD;  [facility];  Service: Orthopedics;  Laterality: N/A;  48min  . LYMPH NODE DISSECTION     Bilateral pelvic  . RETROPUBIC PROSTATECTOMY     Radical    Family History  Problem Relation Age of Onset  . Aneurysm Father  Cerebral  . Stroke Father   . Hypertension Father   . Cancer Mother        colon  . Parkinson's disease Son     Social history:  reports that he has never smoked. He has never used smokeless tobacco. He reports current alcohol use. He reports that he does not use drugs.    Allergies  Allergen Reactions  . Crestor [Rosuvastatin]     GYNECOMASTIA AND SWELLING    Medications:  Prior to Admission medications   Medication Sig Start Date End Date Taking? Authorizing Provider  aspirin EC 81  MG tablet Take 81 mg by mouth daily.   Yes [provider]  celecoxib (CELEBREX) 200 MG capsule Take 1 capsule (200 mg total) by mouth daily. With food 02/28/20  Yes Stacks, [name], MD  Cholecalciferol (VITAMIN D) 2000 units CAPS Take 2,000 Units by mouth daily.   Yes [provider]  Dextromethorphan-quiNIDine (NUEDEXTA) 20-10 MG capsule Take 1 capsule by mouth daily. 02/28/20  Yes Claretta Fraise, MD  diclofenac sodium (VOLTAREN) 1 % GEL Apply 2 g topically 4 (four) times daily. 11/07/18  Yes RakesConnye Burkitt, FNP  fluticasone Asencion Islam) 50 MCG/ACT nasal spray One to 2 sprays each nostril at bedtime 10/12/14  Yes Chipper Herb, MD  furosemide (LASIX) 40 MG tablet Take 1 tablet (40 mg total) by mouth daily. 09/29/19  Yes Claretta Fraise, MD  Iron, Ferrous Sulfate, 325 (65 Fe) MG TABS Take 1 tablet by mouth every Monday, Wednesday, and Friday. 09/30/19  Yes Stacks, [name], MD  nitroGLYCERIN (NITROSTAT) 0.4 MG SL tablet Place 1 tablet (0.4 mg total) under the tongue every 5 (five) minutes as needed for chest pain. 07/20/15  Yes Kilroy, Luke K, PA-C  pantoprazole (PROTONIX) 40 MG tablet TAKE 1 TABLET ONCE DAILY FOR REFLUX 02/28/20  Yes Stacks, [name], MD  triamcinolone cream (KENALOG) 0.1 % Apply 1 application topically 2 (two) times daily. Patient taking differently: Apply 1 application topically daily as needed (itching). 04/30/17  Yes Chipper Herb, MD    ROS:  Out of a complete 14 system review of symptoms, the patient complains only of the following symptoms, and all other reviewed systems are negative.  Weight loss Walking difficulty Difficulty with handwriting  Blood pressure (!) 171/65, pulse 67, height 6' (1.829 m), weight 159 lb 9.6 oz (72.4 kg).  Physical Exam  General: The patient is alert and cooperative at the time of the examination.  Skin: No significant peripheral edema is noted.   Neurologic Exam  Mental status: The patient is alert and oriented x 3 at the  time of the examination. The patient has apparent normal recent and remote memory, with an apparently normal attention span and concentration ability.   Cranial nerves: Facial symmetry is present. Speech is normal, no aphasia or dysarthria is noted. Extraocular movements are full. Visual fields are full.  Some masking the face is seen.  Motor: The patient has good strength in all 4 extremities.  Sensory examination: Soft touch sensation is symmetric on the face, arms, and legs.  Coordination: The patient has good finger-nose-finger and heel-to-shin bilaterally.  Gait and station: The patient has some difficulty arising from a seated position.  Once up, he is able to ambulate independently but has the right arm in flexion, not swinging the right arm, has arm swing on the left.  He seems to shuffle more with his right leg than the left.  Tandem gait was not attempted.  Reflexes: Deep  tendon reflexes are symmetric.   Assessment/Plan:  1.  Gait disorder, possible parkinsonism  2.  [name] affect  The patient has now developed some focal aspects to his gait, with decreased arm swing in the right arm, he has masking of the face, and with reports of micrographia.  The patient will be started on low-dose Sinemet taking the 25/100 mg tablets, 1/2 tablet 3 times daily for 1 month and then go to 1 full tablet 3 times daily.  He will follow up here in 3 months.  Jill Alexanders MD 05/31/2020 11:02 AM  [facility] [address] [place], [place] [address]  Phone [phone] Fax [phone]

## 2020-06-06 DIAGNOSIS — H02831 Dermatochalasis of right upper eyelid: Secondary | ICD-10-CM | POA: Diagnosis not present

## 2020-06-06 DIAGNOSIS — H02135 Senile ectropion of left lower eyelid: Secondary | ICD-10-CM | POA: Diagnosis not present

## 2020-06-06 DIAGNOSIS — Z961 Presence of intraocular lens: Secondary | ICD-10-CM | POA: Diagnosis not present

## 2020-06-06 DIAGNOSIS — H43813 Vitreous degeneration, bilateral: Secondary | ICD-10-CM | POA: Diagnosis not present

## 2020-06-06 DIAGNOSIS — H04123 Dry eye syndrome of bilateral lacrimal glands: Secondary | ICD-10-CM | POA: Diagnosis not present

## 2020-06-06 DIAGNOSIS — H02423 Myogenic ptosis of bilateral eyelids: Secondary | ICD-10-CM | POA: Diagnosis not present

## 2020-06-06 DIAGNOSIS — H02834 Dermatochalasis of left upper eyelid: Secondary | ICD-10-CM | POA: Diagnosis not present

## 2020-06-06 DIAGNOSIS — H353131 Nonexudative age-related macular degeneration, bilateral, early dry stage: Secondary | ICD-10-CM | POA: Diagnosis not present

## 2020-06-09 ENCOUNTER — Other Ambulatory Visit: Payer: Self-pay

## 2020-06-09 ENCOUNTER — Ambulatory Visit (INDEPENDENT_AMBULATORY_CARE_PROVIDER_SITE_OTHER): Payer: MEDICARE | Admitting: Family Medicine

## 2020-06-09 ENCOUNTER — Encounter: Payer: Self-pay | Admitting: Family Medicine

## 2020-06-09 VITALS — BP 135/76 | HR 99 | Temp 97.9°F | Ht 72.0 in | Wt 154.6 lb

## 2020-06-09 DIAGNOSIS — I700 Atherosclerosis of aorta: Secondary | ICD-10-CM

## 2020-06-09 DIAGNOSIS — N1832 Chronic kidney disease, stage 3b: Secondary | ICD-10-CM | POA: Diagnosis not present

## 2020-06-09 DIAGNOSIS — I350 Nonrheumatic aortic (valve) stenosis: Secondary | ICD-10-CM | POA: Diagnosis not present

## 2020-06-09 DIAGNOSIS — D469 Myelodysplastic syndrome, unspecified: Secondary | ICD-10-CM

## 2020-06-09 DIAGNOSIS — I2510 Atherosclerotic heart disease of native coronary artery without angina pectoris: Secondary | ICD-10-CM

## 2020-06-09 DIAGNOSIS — I208 Other forms of angina pectoris: Secondary | ICD-10-CM | POA: Diagnosis not present

## 2020-06-09 DIAGNOSIS — R011 Cardiac murmur, unspecified: Secondary | ICD-10-CM | POA: Diagnosis not present

## 2020-06-09 NOTE — Progress Notes (Signed)
Subjective:  Patient ID: Jon Reid, male    DOB: 05-22-1928  Age: 87 y.o. MRN: [id number]  CC: Knee Pain (right)   HPI Jon Reid presents for had stroke during heart surgery in Nov. 2020. Right hand use is okay, for lifting objects. However, he can't write plainly. Having weakness. Gets exhausted walking from the car to our front door.   Pt. Having right knee pain. It has responded to cortisone injections in the past. Had a series of three shots over a year ago with Ortho.   Depression screen [facility] 2/9 06/09/2020 02/25/2020 11/26/2019  Decreased Interest 0 0 0  Down, Depressed, Hopeless 0 0 0  PHQ - 2 Score 0 0 0  Altered sleeping - - -  Tired, decreased energy - - -  Change in appetite - - -  Feeling bad or failure about yourself  - - -  Trouble concentrating - - -  Moving slowly or fidgety/restless - - -  Suicidal thoughts - - -  PHQ-9 Score - - -  Some recent data might be hidden    History Jon Reid has a past medical history of Arthritis, Carcinoma of prostate ([place]), CKD (chronic kidney disease) stage 2, GFR 60-89 ml/min, Coronary artery disease, First degree AV block (01/28/2019), GERD (gastroesophageal reflux disease), Gout, HTN (hypertension), echocardiogram, Macrocytosis without anemia (01/08/2012), Moderate aortic stenosis (12/06/2017), [name] affect (02/09/2020), Right BBB/left ant fasc block (01/28/2019), S/P CABG x 4 (02/02/2013), Sleep apnea, Spinal stenosis, and Stroke ([place]).   He has a past surgical history that includes Retropubic prostatectomy; Lymph node dissection; Cardiac catheterization (11/07/2012); Coronary artery bypass graft (N/A, 11/11/2012); Intraoprative transesophageal echocardiogram (N/A, 11/11/2012); left heart catheterization with coronary angiogram (N/A, 11/07/2012); Inguinal hernia repair (Right, 01/10/2017); Insertion of mesh (Right, 01/10/2017); Cataract extraction w/ intraocular lens implant (Bilateral); and Lumbar laminectomy/decompression  microdiscectomy (N/A, 02/05/2019).   His family history includes Aneurysm in his father; Cancer in his mother; Hypertension in his father; Parkinson's disease in his son; Stroke in his father.He reports that he has never smoked. He has never used smokeless tobacco. He reports current alcohol use. He reports that he does not use drugs.    ROS Review of Systems  Constitutional: Negative for fever.  Respiratory: Negative for shortness of breath.   Cardiovascular: Negative for chest pain.  Musculoskeletal: Positive for arthralgias.  Skin: Negative for rash.    Objective:  BP 135/76   Pulse 99   Temp 97.9 F (36.6 C)   Ht 6' (1.829 m)   Wt 154 lb 9.6 oz (70.1 kg)   SpO2 100%   BMI 20.97 kg/m   BP Readings from Last 3 Encounters:  06/09/20 135/76  05/31/20 (!) 171/65  02/25/20 130/79    Wt Readings from Last 3 Encounters:  06/09/20 154 lb 9.6 oz (70.1 kg)  05/31/20 159 lb 9.6 oz (72.4 kg)  02/25/20 148 lb 6.4 oz (67.3 kg)     Physical Exam Vitals reviewed.  Constitutional:      Appearance: He is well-developed.  HENT:     Head: Normocephalic and atraumatic.     Right Ear: External ear normal.     Left Ear: External ear normal.     Mouth/Throat:     Pharynx: No oropharyngeal exudate or posterior oropharyngeal erythema.  Eyes:     Pupils: Pupils are equal, round, and reactive to light.  Cardiovascular:     Rate and Rhythm: Normal rate and regular rhythm.     Heart sounds: No  murmur heard.   Pulmonary:     Effort: No respiratory distress.     Breath sounds: Normal breath sounds.  Musculoskeletal:     Cervical back: Normal range of motion and neck supple.  Neurological:     Mental Status: He is alert and oriented to person, place, and time.       Assessment & Plan:   Jon Reid was seen today for knee pain.  Diagnoses and all orders for this visit:  Coronary artery disease involving native coronary artery of native heart without angina pectoris  Abdominal  aortic atherosclerosis (HCC)  Chronic renal impairment, stage 3b (HCC)  Nonrheumatic aortic valve stenosis  Myelodysplastic syndrome  Exertional angina (HCC)  Murmur, cardiac       I am having Jon Reid maintain his fluticasone, nitroGLYCERIN, aspirin EC, Vitamin D, triamcinolone, diclofenac sodium, furosemide, Iron (Ferrous Sulfate), pantoprazole, celecoxib, and carbidopa-levodopa.  Allergies as of 06/09/2020      Reactions   Crestor [rosuvastatin]    GYNECOMASTIA AND SWELLING      Medication List       Accurate as of June 09, 2020 11:59 PM. If you have any questions, ask your nurse or doctor.        aspirin EC 81 MG tablet Take 81 mg by mouth daily.   carbidopa-levodopa 25-100 MG tablet Commonly known as: SINEMET IR 1/2 tablet three times a day for 4 weeks, then take one tablet three times a day   celecoxib 200 MG capsule Commonly known as: CeleBREX Take 1 capsule (200 mg total) by mouth daily. With food   diclofenac sodium 1 % Gel Commonly known as: VOLTAREN Apply 2 g topically 4 (four) times daily.   fluticasone 50 MCG/ACT nasal spray Commonly known as: FLONASE One to 2 sprays each nostril at bedtime   furosemide 40 MG tablet Commonly known as: LASIX Take 1 tablet (40 mg total) by mouth daily.   Iron (Ferrous Sulfate) 325 (65 Fe) MG Tabs Take 1 tablet by mouth every Monday, Wednesday, and Friday.   nitroGLYCERIN 0.4 MG SL tablet Commonly known as: NITROSTAT Place 1 tablet (0.4 mg total) under the tongue every 5 (five) minutes as needed for chest pain.   pantoprazole 40 MG tablet Commonly known as: PROTONIX TAKE 1 TABLET ONCE DAILY FOR REFLUX   triamcinolone 0.1 % Commonly known as: KENALOG Apply 1 application topically 2 (two) times daily. What changed:   when to take this  reasons to take this   Vitamin D 50 MCG (2000 UT) Caps Take 2,000 Units by mouth daily.        Follow-up: Return in about 3 months (around  09/09/2020).  Claretta Fraise, M.D.

## 2020-06-13 ENCOUNTER — Encounter: Payer: Self-pay | Admitting: Family Medicine

## 2020-06-21 ENCOUNTER — Other Ambulatory Visit: Payer: Self-pay

## 2020-06-21 ENCOUNTER — Encounter: Payer: Self-pay | Admitting: Family Medicine

## 2020-06-21 ENCOUNTER — Ambulatory Visit (INDEPENDENT_AMBULATORY_CARE_PROVIDER_SITE_OTHER): Payer: MEDICARE | Admitting: Family Medicine

## 2020-06-21 VITALS — BP 122/64 | HR 81 | Temp 97.9°F | Ht 72.0 in | Wt 155.8 lb

## 2020-06-21 DIAGNOSIS — G8929 Other chronic pain: Secondary | ICD-10-CM

## 2020-06-21 DIAGNOSIS — M5136 Other intervertebral disc degeneration, lumbar region: Secondary | ICD-10-CM | POA: Diagnosis not present

## 2020-06-21 DIAGNOSIS — I208 Other forms of angina pectoris: Secondary | ICD-10-CM | POA: Diagnosis not present

## 2020-06-21 DIAGNOSIS — M25561 Pain in right knee: Secondary | ICD-10-CM

## 2020-06-21 MED ORDER — PREDNISONE 10 MG PO TABS
ORAL_TABLET | ORAL | 0 refills | Status: DC
Start: 2020-06-21 — End: 2020-06-29

## 2020-06-21 MED ORDER — CELECOXIB 400 MG PO CAPS: 400.0000 mg | ORAL_CAPSULE | Freq: Every day | ORAL | 5 refills | Status: DC

## 2020-06-21 NOTE — Progress Notes (Signed)
Subjective:  Patient ID: Jon Reid, male    DOB: 05-29-1928  Age: 87 y.o. MRN: [id number]  CC: Leg Pain (Right femur/ knee pain)   HPI KEEN EWALT presents for right knee pain. Cortisone only helped for 2 days.  Would like to have something to care for relief.  He relates once again that this started when he had a stroke while on the operating table.  Depression screen [facility] 2/9 06/21/2020 06/21/2020 06/09/2020  Decreased Interest 0 0 0  Down, Depressed, Hopeless 0 0 0  PHQ - 2 Score 0 0 0  Altered sleeping - - -  Tired, decreased energy - - -  Change in appetite - - -  Feeling bad or failure about yourself  - - -  Trouble concentrating - - -  Moving slowly or fidgety/restless - - -  Suicidal thoughts - - -  PHQ-9 Score - - -  Some recent data might be hidden    History Huntington has a past medical history of Arthritis, Carcinoma of prostate ([place]), CKD (chronic kidney disease) stage 2, GFR 60-89 ml/min, Coronary artery disease, First degree AV block (01/28/2019), GERD (gastroesophageal reflux disease), Gout, HTN (hypertension), echocardiogram, Macrocytosis without anemia (01/08/2012), Moderate aortic stenosis (12/06/2017), [name] affect (02/09/2020), Right BBB/left ant fasc block (01/28/2019), S/P CABG x 4 (02/02/2013), Sleep apnea, Spinal stenosis, and Stroke ([place]).   He has a past surgical history that includes Retropubic prostatectomy; Lymph node dissection; Cardiac catheterization (11/07/2012); Coronary artery bypass graft (N/A, 11/11/2012); Intraoprative transesophageal echocardiogram (N/A, 11/11/2012); left heart catheterization with coronary angiogram (N/A, 11/07/2012); Inguinal hernia repair (Right, 01/10/2017); Insertion of mesh (Right, 01/10/2017); Cataract extraction w/ intraocular lens implant (Bilateral); and Lumbar laminectomy/decompression microdiscectomy (N/A, 02/05/2019).   His family history includes Aneurysm in his father; Cancer in his mother; Hypertension in his  father; Parkinson's disease in his son; Stroke in his father.He reports that he has never smoked. He has never used smokeless tobacco. He reports current alcohol use. He reports that he does not use drugs.    ROS Review of Systems  Constitutional: Negative for activity change.  Musculoskeletal: Positive for arthralgias.  Neurological: Negative for dizziness and headaches.  Psychiatric/Behavioral: Negative for dysphoric mood.    Objective:  BP 122/64   Pulse 81   Temp 97.9 F (36.6 C)   Ht 6' (1.829 m)   Wt 155 lb 12.8 oz (70.7 kg)   SpO2 99%   BMI 21.13 kg/m   BP Readings from Last 3 Encounters:  06/21/20 122/64  06/09/20 135/76  05/31/20 (!) 171/65    Wt Readings from Last 3 Encounters:  06/21/20 155 lb 12.8 oz (70.7 kg)  06/09/20 154 lb 9.6 oz (70.1 kg)  05/31/20 159 lb 9.6 oz (72.4 kg)     Physical Exam Vitals reviewed.  Constitutional:      Appearance: He is well-developed.  HENT:     Head: Normocephalic and atraumatic.     Right Ear: External ear normal.     Left Ear: External ear normal.     Mouth/Throat:     Pharynx: No oropharyngeal exudate or posterior oropharyngeal erythema.  Eyes:     Pupils: Pupils are equal, round, and reactive to light.  Cardiovascular:     Rate and Rhythm: Normal rate and regular rhythm.     Heart sounds: No murmur heard.   Pulmonary:     Effort: No respiratory distress.     Breath sounds: Normal breath sounds.  Musculoskeletal:  General: Tenderness (right knee, joint line, anteriorly and medially. ) present.     Cervical back: Normal range of motion and neck supple.  Neurological:     Mental Status: He is alert and oriented to person, place, and time.    MRI of September 11, 2019 was reviewed.  This shows that he has tears of both the medial and lateral meniscus as well as mild lateral compartment arthritis, evidence for patella tendon and lateral femoral condyle friction syndrome as well as prepatellar  bursitis.   Assessment & Plan:   Zayden was seen today for leg pain.  Diagnoses and all orders for this visit:  Chronic pain of right knee  Degeneration of lumbar intervertebral disc -     celecoxib (CELEBREX) 400 MG capsule; Take 1 capsule (400 mg total) by mouth daily. With food  Other orders -     predniSONE (DELTASONE) 10 MG tablet; Take 5 daily for 3 days followed by 4,3,2 and 1 for 3 days each.       I have changed Barnabas Lister A. Blackstock's celecoxib. I am also having him start on predniSONE. Additionally, I am having him maintain his fluticasone, nitroGLYCERIN, aspirin EC, Vitamin D, triamcinolone, diclofenac sodium, furosemide, Iron (Ferrous Sulfate), pantoprazole, and carbidopa-levodopa.  Allergies as of 06/21/2020      Reactions   Crestor [rosuvastatin]    GYNECOMASTIA AND SWELLING      Medication List       Accurate as of June 21, 2020  7:02 PM. If you have any questions, ask your nurse or doctor.        aspirin EC 81 MG tablet Take 81 mg by mouth daily.   carbidopa-levodopa 25-100 MG tablet Commonly known as: SINEMET IR 1/2 tablet three times a day for 4 weeks, then take one tablet three times a day   celecoxib 400 MG capsule Commonly known as: CeleBREX Take 1 capsule (400 mg total) by mouth daily. With food What changed:   medication strength  how much to take Changed by: Claretta Fraise, MD   diclofenac sodium 1 % Gel Commonly known as: VOLTAREN Apply 2 g topically 4 (four) times daily.   fluticasone 50 MCG/ACT nasal spray Commonly known as: FLONASE One to 2 sprays each nostril at bedtime   furosemide 40 MG tablet Commonly known as: LASIX Take 1 tablet (40 mg total) by mouth daily.   Iron (Ferrous Sulfate) 325 (65 Fe) MG Tabs Take 1 tablet by mouth every Monday, Wednesday, and Friday.   nitroGLYCERIN 0.4 MG SL tablet Commonly known as: NITROSTAT Place 1 tablet (0.4 mg total) under the tongue every 5 (five) minutes as needed for chest pain.    pantoprazole 40 MG tablet Commonly known as: PROTONIX TAKE 1 TABLET ONCE DAILY FOR REFLUX   predniSONE 10 MG tablet Commonly known as: DELTASONE Take 5 daily for 3 days followed by 4,3,2 and 1 for 3 days each. Started by: Claretta Fraise, MD   triamcinolone 0.1 % Commonly known as: KENALOG Apply 1 application topically 2 (two) times daily. What changed:   when to take this  reasons to take this   Vitamin D 50 MCG (2000 UT) Caps Take 2,000 Units by mouth daily.        Follow-up: Return if symptoms worsen or fail to improve.  Claretta Fraise, M.D.

## 2020-06-25 ENCOUNTER — Inpatient Hospital Stay (HOSPITAL_COMMUNITY)
Admission: EM | Admit: 2020-06-25 | Discharge: 2020-06-29 | DRG: 380 | Disposition: A | Discharge status: Home / Self Care | Payer: MEDICARE | Attending: Internal Medicine | Admitting: Internal Medicine

## 2020-06-25 ENCOUNTER — Encounter (HOSPITAL_COMMUNITY): Payer: Self-pay | Admitting: Emergency Medicine

## 2020-06-25 ENCOUNTER — Other Ambulatory Visit: Payer: Self-pay

## 2020-06-25 ENCOUNTER — Inpatient Hospital Stay (HOSPITAL_COMMUNITY): Payer: MEDICARE

## 2020-06-25 DIAGNOSIS — I350 Nonrheumatic aortic (valve) stenosis: Secondary | ICD-10-CM | POA: Diagnosis not present

## 2020-06-25 DIAGNOSIS — K449 Diaphragmatic hernia without obstruction or gangrene: Secondary | ICD-10-CM | POA: Diagnosis present

## 2020-06-25 DIAGNOSIS — Z791 Long term (current) use of non-steroidal anti-inflammatories (NSAID): Secondary | ICD-10-CM

## 2020-06-25 DIAGNOSIS — F482 Pseudobulbar affect: Secondary | ICD-10-CM | POA: Diagnosis present

## 2020-06-25 DIAGNOSIS — R531 Weakness: Secondary | ICD-10-CM | POA: Diagnosis not present

## 2020-06-25 DIAGNOSIS — I739 Peripheral vascular disease, unspecified: Secondary | ICD-10-CM | POA: Diagnosis present

## 2020-06-25 DIAGNOSIS — Z951 Presence of aortocoronary bypass graft: Secondary | ICD-10-CM

## 2020-06-25 DIAGNOSIS — R110 Nausea: Secondary | ICD-10-CM | POA: Diagnosis not present

## 2020-06-25 DIAGNOSIS — R778 Other specified abnormalities of plasma proteins: Secondary | ICD-10-CM | POA: Diagnosis not present

## 2020-06-25 DIAGNOSIS — J1282 Pneumonia due to coronavirus disease 2019: Secondary | ICD-10-CM | POA: Diagnosis present

## 2020-06-25 DIAGNOSIS — K2211 Ulcer of esophagus with bleeding: Principal | ICD-10-CM | POA: Diagnosis present

## 2020-06-25 DIAGNOSIS — Z8673 Personal history of transient ischemic attack (TIA), and cerebral infarction without residual deficits: Secondary | ICD-10-CM | POA: Diagnosis not present

## 2020-06-25 DIAGNOSIS — G8929 Other chronic pain: Secondary | ICD-10-CM | POA: Diagnosis present

## 2020-06-25 DIAGNOSIS — Z8546 Personal history of malignant neoplasm of prostate: Secondary | ICD-10-CM

## 2020-06-25 DIAGNOSIS — G4730 Sleep apnea, unspecified: Secondary | ICD-10-CM | POA: Diagnosis present

## 2020-06-25 DIAGNOSIS — I2510 Atherosclerotic heart disease of native coronary artery without angina pectoris: Secondary | ICD-10-CM | POA: Diagnosis not present

## 2020-06-25 DIAGNOSIS — U071 COVID-19: Secondary | ICD-10-CM | POA: Diagnosis not present

## 2020-06-25 DIAGNOSIS — E876 Hypokalemia: Secondary | ICD-10-CM | POA: Diagnosis not present

## 2020-06-25 DIAGNOSIS — N183 Chronic kidney disease, stage 3 unspecified: Secondary | ICD-10-CM | POA: Diagnosis present

## 2020-06-25 DIAGNOSIS — Z888 Allergy status to other drugs, medicaments and biological substances status: Secondary | ICD-10-CM

## 2020-06-25 DIAGNOSIS — I252 Old myocardial infarction: Secondary | ICD-10-CM | POA: Diagnosis not present

## 2020-06-25 DIAGNOSIS — I129 Hypertensive chronic kidney disease with stage 1 through stage 4 chronic kidney disease, or unspecified chronic kidney disease: Secondary | ICD-10-CM | POA: Diagnosis not present

## 2020-06-25 DIAGNOSIS — Z9079 Acquired absence of other genital organ(s): Secondary | ICD-10-CM

## 2020-06-25 DIAGNOSIS — K922 Gastrointestinal hemorrhage, unspecified: Secondary | ICD-10-CM | POA: Diagnosis present

## 2020-06-25 DIAGNOSIS — N1832 Chronic kidney disease, stage 3b: Secondary | ICD-10-CM | POA: Diagnosis not present

## 2020-06-25 DIAGNOSIS — D62 Acute posthemorrhagic anemia: Secondary | ICD-10-CM | POA: Diagnosis not present

## 2020-06-25 DIAGNOSIS — I4510 Unspecified right bundle-branch block: Secondary | ICD-10-CM | POA: Diagnosis present

## 2020-06-25 DIAGNOSIS — E785 Hyperlipidemia, unspecified: Secondary | ICD-10-CM | POA: Diagnosis present

## 2020-06-25 DIAGNOSIS — K921 Melena: Secondary | ICD-10-CM | POA: Diagnosis not present

## 2020-06-25 DIAGNOSIS — K2101 Gastro-esophageal reflux disease with esophagitis, with bleeding: Secondary | ICD-10-CM | POA: Diagnosis not present

## 2020-06-25 DIAGNOSIS — K219 Gastro-esophageal reflux disease without esophagitis: Secondary | ICD-10-CM | POA: Diagnosis not present

## 2020-06-25 DIAGNOSIS — I440 Atrioventricular block, first degree: Secondary | ICD-10-CM | POA: Diagnosis not present

## 2020-06-25 DIAGNOSIS — Z7982 Long term (current) use of aspirin: Secondary | ICD-10-CM

## 2020-06-25 DIAGNOSIS — D500 Iron deficiency anemia secondary to blood loss (chronic): Secondary | ICD-10-CM | POA: Diagnosis not present

## 2020-06-25 DIAGNOSIS — I248 Other forms of acute ischemic heart disease: Secondary | ICD-10-CM | POA: Diagnosis not present

## 2020-06-25 DIAGNOSIS — I517 Cardiomegaly: Secondary | ICD-10-CM | POA: Diagnosis not present

## 2020-06-25 DIAGNOSIS — R1111 Vomiting without nausea: Secondary | ICD-10-CM | POA: Diagnosis not present

## 2020-06-25 DIAGNOSIS — K2100 Gastro-esophageal reflux disease with esophagitis, without bleeding: Secondary | ICD-10-CM

## 2020-06-25 DIAGNOSIS — Z823 Family history of stroke: Secondary | ICD-10-CM

## 2020-06-25 DIAGNOSIS — M109 Gout, unspecified: Secondary | ICD-10-CM | POA: Diagnosis not present

## 2020-06-25 DIAGNOSIS — K208 Other esophagitis without bleeding: Secondary | ICD-10-CM | POA: Diagnosis not present

## 2020-06-25 DIAGNOSIS — Z79899 Other long term (current) drug therapy: Secondary | ICD-10-CM

## 2020-06-25 DIAGNOSIS — Z743 Need for continuous supervision: Secondary | ICD-10-CM | POA: Diagnosis not present

## 2020-06-25 HISTORY — DX: Chronic kidney disease, stage 3 unspecified: N18.30

## 2020-06-25 LAB — FOLATE: Folate: 15 ng/mL (ref >5.9)

## 2020-06-25 LAB — FERRITIN
Ferritin: 13 ng/mL — ABNORMAL LOW (ref 24–336)
Ferritin: 13 ng/mL — ABNORMAL LOW (ref 24–336)

## 2020-06-25 LAB — URINALYSIS, ROUTINE W REFLEX MICROSCOPIC
Bilirubin Urine: NEGATIVE
Glucose, UA: NEGATIVE mg/dL
Hgb urine dipstick: NEGATIVE
Ketones, ur: 5 mg/dL — AB
Leukocytes,Ua: NEGATIVE
Nitrite: NEGATIVE
Protein, ur: NEGATIVE mg/dL
Specific Gravity, Urine: 1.018 (ref 1.005–1.030)
pH: 5 (ref 5.0–8.0)

## 2020-06-25 LAB — RETICULOCYTES
Immature Retic Fract: 35.6 % — ABNORMAL HIGH (ref 2.3–15.9)
RBC.: 1.78 MIL/uL — ABNORMAL LOW (ref 4.22–5.81)
Retic Count, Absolute: 58.9 10*3/uL (ref 19.0–186.0)
Retic Ct Pct: 3.3 % — ABNORMAL HIGH (ref 0.4–3.1)

## 2020-06-25 LAB — COMPREHENSIVE METABOLIC PANEL
ALT: 19 U/L (ref 0–44)
AST: 27 U/L (ref 15–41)
Albumin: 3.7 g/dL (ref 3.5–5.0)
Alkaline Phosphatase: 42 U/L (ref 38–126)
Anion gap: 10 (ref 5–15)
BUN: 68 mg/dL — ABNORMAL HIGH (ref 8–23)
CO2: 23 mmol/L (ref 22–32)
Calcium: 9.5 mg/dL (ref 8.9–10.3)
Chloride: 105 mmol/L (ref 98–111)
Creatinine, Ser: 1.4 mg/dL — ABNORMAL HIGH (ref 0.61–1.24)
GFR, Estimated: 48 mL/min — ABNORMAL LOW (ref >60)
Glucose, Bld: 124 mg/dL — ABNORMAL HIGH (ref 70–99)
Potassium: 4 mmol/L (ref 3.5–5.1)
Sodium: 138 mmol/L (ref 135–145)
Total Bilirubin: 0.8 mg/dL (ref 0.3–1.2)
Total Protein: 5.8 g/dL — ABNORMAL LOW (ref 6.5–8.1)

## 2020-06-25 LAB — RESP PANEL BY RT-PCR (FLU A&B, COVID) ARPGX2
Influenza A by PCR: NEGATIVE
Influenza B by PCR: NEGATIVE
SARS Coronavirus 2 by RT PCR: POSITIVE — AB

## 2020-06-25 LAB — CBC WITH DIFFERENTIAL/PLATELET
Abs Immature Granulocytes: 0.06 10*3/uL (ref 0.00–0.07)
Basophils Absolute: 0 10*3/uL (ref 0.0–0.1)
Basophils Relative: 0 %
Eosinophils Absolute: 0 10*3/uL (ref 0.0–0.5)
Eosinophils Relative: 0 %
HCT: 19.6 % — ABNORMAL LOW (ref 39.0–52.0)
Hemoglobin: 6.1 g/dL — CL (ref 13.0–17.0)
Immature Granulocytes: 0 %
Lymphocytes Relative: 10 %
Lymphs Abs: 1.5 10*3/uL (ref 0.7–4.0)
MCH: 32.6 pg (ref 26.0–34.0)
MCHC: 31.1 g/dL (ref 30.0–36.0)
MCV: 104.8 fL — ABNORMAL HIGH (ref 80.0–100.0)
Monocytes Absolute: 1 10*3/uL (ref 0.1–1.0)
Monocytes Relative: 6 %
Neutro Abs: 12.7 10*3/uL — ABNORMAL HIGH (ref 1.7–7.7)
Neutrophils Relative %: 84 %
Platelets: 226 10*3/uL (ref 150–400)
RBC: 1.87 MIL/uL — ABNORMAL LOW (ref 4.22–5.81)
RDW: 14.3 % (ref 11.5–15.5)
WBC: 15.2 10*3/uL — ABNORMAL HIGH (ref 4.0–10.5)
nRBC: 0 % (ref 0.0–0.2)

## 2020-06-25 LAB — POC OCCULT BLOOD, ED: Fecal Occult Bld: POSITIVE — AB

## 2020-06-25 LAB — HEPATIC FUNCTION PANEL
ALT: 18 U/L (ref 0–44)
AST: 27 U/L (ref 15–41)
Albumin: 3.7 g/dL (ref 3.5–5.0)
Alkaline Phosphatase: 43 U/L (ref 38–126)
Bilirubin, Direct: 0.1 mg/dL (ref 0.0–0.2)
Indirect Bilirubin: 0.8 mg/dL (ref 0.3–0.9)
Total Bilirubin: 0.9 mg/dL (ref 0.3–1.2)
Total Protein: 5.8 g/dL — ABNORMAL LOW (ref 6.5–8.1)

## 2020-06-25 LAB — VITAMIN B12: Vitamin B-12: 189 pg/mL (ref 180–914)

## 2020-06-25 LAB — TROPONIN I (HIGH SENSITIVITY)
Troponin I (High Sensitivity): 1556 ng/L (ref <18)
Troponin I (High Sensitivity): 1572 ng/L (ref <18)

## 2020-06-25 LAB — PROCALCITONIN: Procalcitonin: <0.1 ng/mL

## 2020-06-25 LAB — IRON AND TIBC
Iron: 11 ug/dL — ABNORMAL LOW (ref 45–182)
Saturation Ratios: 4 % — ABNORMAL LOW (ref 17.9–39.5)
TIBC: 309 ug/dL (ref 250–450)
UIBC: 298 ug/dL

## 2020-06-25 LAB — D-DIMER, QUANTITATIVE: D-Dimer, Quant: 0.67 ug/mL-FEU — ABNORMAL HIGH (ref 0.00–0.50)

## 2020-06-25 LAB — C-REACTIVE PROTEIN: CRP: 0.6 mg/dL (ref <1.0)

## 2020-06-25 LAB — MRSA PCR SCREENING: MRSA by PCR: NEGATIVE

## 2020-06-25 LAB — LIPASE, BLOOD: Lipase: 24 U/L (ref 11–51)

## 2020-06-25 MED ORDER — SODIUM CHLORIDE 0.9 % IV SOLN
100.0000 mg | INTRAVENOUS | Status: AC
  Administered 2020-06-25 (×2): 100 mg via INTRAVENOUS
  Filled 2020-06-25: qty 20

## 2020-06-25 MED ORDER — SODIUM CHLORIDE 0.9 % IV SOLN: INTRAVENOUS | Status: DC

## 2020-06-25 MED ORDER — ONDANSETRON HCL 4 MG/2ML IJ SOLN
4.0000 mg | Freq: Four times a day (QID) | INTRAMUSCULAR | Status: DC | PRN
  Administered 2020-06-26: 4 mg via INTRAVENOUS
  Filled 2020-06-25: qty 2

## 2020-06-25 MED ORDER — SODIUM CHLORIDE 0.9 % IV BOLUS
1000.0000 mL | Freq: Once | INTRAVENOUS | Status: AC
  Administered 2020-06-25: 1000 mL via INTRAVENOUS

## 2020-06-25 MED ORDER — ACETAMINOPHEN 325 MG PO TABS: 650.0000 mg | ORAL_TABLET | Freq: Four times a day (QID) | ORAL | Status: DC | PRN

## 2020-06-25 MED ORDER — PANTOPRAZOLE SODIUM 40 MG IV SOLR
40.0000 mg | Freq: Two times a day (BID) | INTRAVENOUS | Status: DC
  Administered 2020-06-29 (×2): 40 mg via INTRAVENOUS
  Filled 2020-06-25 (×2): qty 40

## 2020-06-25 MED ORDER — SODIUM CHLORIDE 0.9 % IV SOLN
100.0000 mg | Freq: Every day | INTRAVENOUS | Status: AC
  Administered 2020-06-26 – 2020-06-27 (×2): 100 mg via INTRAVENOUS
  Filled 2020-06-25 (×3): qty 20

## 2020-06-25 MED ORDER — SODIUM CHLORIDE 0.9 % IV SOLN
80.0000 mg | Freq: Once | INTRAVENOUS | Status: AC
  Administered 2020-06-25: 80 mg via INTRAVENOUS
  Filled 2020-06-25: qty 80

## 2020-06-25 MED ORDER — ONDANSETRON HCL 4 MG/2ML IJ SOLN
4.0000 mg | Freq: Once | INTRAMUSCULAR | Status: AC
  Administered 2020-06-25: 4 mg via INTRAVENOUS
  Filled 2020-06-25: qty 2

## 2020-06-25 MED ORDER — ASCORBIC ACID 500 MG PO TABS
500.0000 mg | ORAL_TABLET | Freq: Every day | ORAL | Status: DC
  Administered 2020-06-26 – 2020-06-29 (×3): 500 mg via ORAL
  Filled 2020-06-25 (×4): qty 1

## 2020-06-25 MED ORDER — SODIUM CHLORIDE 0.9 % IV SOLN: 100.0000 mg | Freq: Every day | INTRAVENOUS | Status: DC

## 2020-06-25 MED ORDER — ONDANSETRON HCL 4 MG PO TABS: 4.0000 mg | ORAL_TABLET | Freq: Four times a day (QID) | ORAL | Status: DC | PRN

## 2020-06-25 MED ORDER — PANTOPRAZOLE SODIUM 40 MG IV SOLR: 40.0000 mg | Freq: Once | INTRAVENOUS | Status: DC

## 2020-06-25 MED ORDER — ZINC SULFATE 220 (50 ZN) MG PO CAPS
220.0000 mg | ORAL_CAPSULE | Freq: Every day | ORAL | Status: DC
  Administered 2020-06-26 – 2020-06-29 (×3): 220 mg via ORAL
  Filled 2020-06-25 (×4): qty 1

## 2020-06-25 MED ORDER — SODIUM CHLORIDE 0.9% IV SOLUTION: Freq: Once | INTRAVENOUS | Status: AC

## 2020-06-25 MED ORDER — FLUTICASONE PROPIONATE 50 MCG/ACT NA SUSP
2.0000 | Freq: Every day | NASAL | Status: DC
  Administered 2020-06-26 – 2020-06-29 (×4): 2 via NASAL
  Filled 2020-06-25: qty 16

## 2020-06-25 MED ORDER — SODIUM CHLORIDE 0.9 % IV SOLN: 200.0000 mg | Freq: Once | INTRAVENOUS | Status: DC

## 2020-06-25 MED ORDER — SODIUM CHLORIDE 0.9 % IV SOLN
10.0000 mL/h | Freq: Once | INTRAVENOUS | Status: AC
  Administered 2020-06-25: 10 mL/h via INTRAVENOUS

## 2020-06-25 MED ORDER — SODIUM CHLORIDE 0.9 % IV SOLN
8.0000 mg/h | INTRAVENOUS | Status: AC
  Administered 2020-06-25 – 2020-06-28 (×6): 8 mg/h via INTRAVENOUS
  Filled 2020-06-25 (×8): qty 80

## 2020-06-25 MED ORDER — CYANOCOBALAMIN 1000 MCG/ML IJ SOLN
1000.0000 ug | Freq: Once | INTRAMUSCULAR | Status: AC
  Administered 2020-06-25: 1000 ug via INTRAMUSCULAR
  Filled 2020-06-25: qty 1

## 2020-06-25 NOTE — ED Provider Notes (Signed)
[facility] EMERGENCY DEPARTMENT Provider Note   CSN: [id number] Arrival date & time: 06/25/20  1115     History Chief Complaint  Patient presents with  . Emesis    Jon Reid is a 87 y.o. male.  Patient states he has been vomiting for 3 days and has felt weak.  Patient does state that some of the vomitus is black.  No fevers no chills  The history is provided by the patient and medical records. No language interpreter was used.  Emesis Severity:  Moderate Quality:  Coffee grounds Able to tolerate:  Liquids Progression:  Unchanged Chronicity:  New Recent urination:  Normal Context: not post-tussive   Relieved by:  Nothing Worsened by:  Nothing Associated symptoms: no abdominal pain, no cough, no diarrhea and no headaches        Past Medical History:  Diagnosis Date  . Arthritis   . Carcinoma of prostate [facility])    prostate  . CKD (chronic kidney disease) stage 2, GFR 60-89 ml/min   . Coronary artery disease    a. LHC (8/14):  3v CAD => s/p CABG (LIMA-LAD, SVG-OM1, SVG-PDA)  . First degree AV block 01/28/2019   Noted on EKG  . GERD (gastroesophageal reflux disease)   . Gout   . HTN (hypertension)   . Hx of echocardiogram    Echocardiogram 11/13/12: EF 55-60%, normal wall motion, biatrial enlargement, mild AI, mild TR, no pericardial effusion  . Macrocytosis without anemia 01/08/2012  . Moderate aortic stenosis 12/06/2017   Noted on ECHO  . [name] affect 02/09/2020  . 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 Sleep apnea    Questionable  . Spinal stenosis   . Stroke [facility])     Patient Active Problem List   Diagnosis Date Noted  . [name] affect 02/09/2020  . Iron deficiency anemia due to chronic blood loss 05/27/2019  . Nonrheumatic mitral valve regurgitation 05/26/2019  . Left anterior fascicular block 05/26/2019  . Educated about COVID-19 virus infection 04/06/2019  . Drug-induced constipation  02/19/2019  . Elevated troponin 02/09/2019  . Fecal occult blood test positive 02/09/2019  . Spinal stenosis, lumbar region with neurogenic claudication 02/05/2019  . Leg swelling 01/26/2019  . Nonrheumatic aortic valve stenosis 01/26/2019  . Multiple joint pain 12/11/2018  . Chronic pain of both knees 11/07/2018  . Vitamin D deficiency 10/23/2018  . Gastroesophageal reflux disease with esophagitis 03/13/2018  . Murmur, cardiac 12/04/2017  . Coronary artery disease involving native coronary artery of native heart without angina pectoris 12/04/2017  . PVC's (premature ventricular contractions) 12/04/2017  . Heart disease 08/21/2017  . Fatigue 11/14/2016  . Abnormality of gait 12/06/2015  . Abdominal aortic atherosclerosis ([place]) 12/01/2015  . Degeneration of lumbar intervertebral disc 12/01/2015  . Exertional angina ([place]) 07/20/2015  . Hernia of abdominal wall 10/12/2014  . Gynecomastia 04/09/2013  . Hx of CABG 02/02/2013  . Myelodysplastic syndrome 11/06/2012  . Vitamin B12 deficiency 07/07/2012  . Hyperlipemia 07/07/2012  . Peripheral vascular disease ([place]) 04/14/2009  . CARCINOMA, PROSTATE 04/13/2009  . Non-thrombocytopenic purpura ([place]) 04/13/2009  . Benign essential hypertension 04/13/2009  . Chronic renal insufficiency, stage III (moderate) ([place]) 04/13/2009  . SLEEP APNEA 04/13/2009    Past Surgical History:  Procedure Laterality Date  . CARDIAC CATHETERIZATION  11/07/2012   Dr Acie Fredrickson  . CATARACT EXTRACTION W/ INTRAOCULAR LENS IMPLANT Bilateral   . CORONARY ARTERY BYPASS GRAFT N/A 11/11/2012  Procedure: CORONARY ARTERY BYPASS GRAFTING times four using Right Greater Saphenous Vein Graft harvested endoscopically and Left Internal Mammary Artery.;  Surgeon: Ivin Poot, MD;  Location: [place];  Service: Open Heart Surgery;  Laterality: N/A;  . INGUINAL HERNIA REPAIR Right 01/10/2017   Procedure: OPEN RIGHT HERNIA REPAIR INGUINAL;  Surgeon: Ileana Roup, MD;   Location: [facility];  Service: General;  Laterality: Right;  . INSERTION OF MESH Right 01/10/2017   Procedure: INSERTION OF MESH;  Surgeon: Ileana Roup, MD;  Location: [facility];  Service: General;  Laterality: Right;  . INTRAOPERATIVE TRANSESOPHAGEAL ECHOCARDIOGRAM N/A 11/11/2012   Procedure: INTRAOPERATIVE TRANSESOPHAGEAL ECHOCARDIOGRAM;  Surgeon: Ivin Poot, MD;  Location: [place];  Service: Open Heart Surgery;  Laterality: N/A;  . LEFT HEART CATHETERIZATION WITH CORONARY ANGIOGRAM N/A 11/07/2012   Procedure: LEFT HEART CATHETERIZATION WITH CORONARY ANGIOGRAM;  Surgeon: Thayer Headings, MD;  Location: [facility] CATH LAB;  Service: Cardiovascular;  Laterality: N/A;  . LUMBAR LAMINECTOMY/DECOMPRESSION MICRODISCECTOMY N/A 02/05/2019   Procedure: LUMBAR LAMINECTOMY/DECOMPRESSION L3-L4;  Surgeon: Latanya Maudlin, MD;  Location: [facility];  Service: Orthopedics;  Laterality: N/A;  68min  . LYMPH NODE DISSECTION     Bilateral pelvic  . RETROPUBIC PROSTATECTOMY     Radical       Family History  Problem Relation Age of Onset  . Aneurysm Father        Cerebral  . Stroke Father   . Hypertension Father   . Cancer Mother        colon  . Parkinson's disease Son     Social History   Tobacco Use  . Smoking status: Never Smoker  . Smokeless tobacco: Never Used  Vaping Use  . Vaping Use: Never used  Substance Use Topics  . Alcohol use: Yes    Comment: Rare  . Drug use: No    Home Medications Prior to Admission medications   Medication Sig Start Date End Date Taking? Authorizing Provider  aspirin EC 81 MG tablet Take 81 mg by mouth daily.    [provider]  carbidopa-levodopa (SINEMET IR) 25-100 MG tablet 1/2 tablet three times a day for 4 weeks, then take one tablet three times a day 05/31/20   Kathrynn Ducking, MD  celecoxib (CELEBREX) 400 MG capsule Take 1 capsule (400 mg total) by mouth daily. With food 06/21/20   Claretta Fraise, MD  Cholecalciferol (VITAMIN D) 2000 units CAPS  Take 2,000 Units by mouth daily.    [provider]  diclofenac sodium (VOLTAREN) 1 % GEL Apply 2 g topically 4 (four) times daily. 11/07/18   Baruch Gouty, FNP  fluticasone Asencion Islam) 50 MCG/ACT nasal spray One to 2 sprays each nostril at bedtime 10/12/14   Chipper Herb, MD  furosemide (LASIX) 40 MG tablet Take 1 tablet (40 mg total) by mouth daily. 09/29/19   Claretta Fraise, MD  Iron, Ferrous Sulfate, 325 (65 Fe) MG TABS Take 1 tablet by mouth every Monday, Wednesday, and Friday. 09/30/19   Claretta Fraise, MD  nitroGLYCERIN (NITROSTAT) 0.4 MG SL tablet Place 1 tablet (0.4 mg total) under the tongue every 5 (five) minutes as needed for chest pain. 07/20/15   Erlene Quan, PA-C  pantoprazole (PROTONIX) 40 MG tablet TAKE 1 TABLET ONCE DAILY FOR REFLUX 02/28/20   Claretta Fraise, MD  predniSONE (DELTASONE) 10 MG tablet Take 5 daily for 3 days followed by 4,3,2 and 1 for 3 days each. 06/21/20   Claretta Fraise, MD  triamcinolone cream (KENALOG) 0.1 % Apply 1 application topically 2 (two) times daily. Patient taking differently: Apply 1 application topically daily as needed (itching). 04/30/17   Chipper Herb, MD    Allergies    Crestor [rosuvastatin]  Review of Systems   Review of Systems  Constitutional: Negative for appetite change and fatigue.  HENT: Negative for congestion, ear discharge and sinus pressure.   Eyes: Negative for discharge.  Respiratory: Negative for cough.   Cardiovascular: Negative for chest pain.  Gastrointestinal: Positive for vomiting. Negative for abdominal pain and diarrhea.  Genitourinary: Negative for frequency and hematuria.  Musculoskeletal: Negative for back pain.  Skin: Negative for rash.  Neurological: Negative for seizures and headaches.  Psychiatric/Behavioral: Negative for hallucinations.    Physical Exam Updated Vital Signs BP (!) 108/52   Pulse 79   Temp 99 F (37.2 C) (Oral)   Resp 15   Ht 6' (1.829 m)   Wt 70 kg   SpO2 100%   BMI  20.93 kg/m   Physical Exam Vitals and nursing note reviewed.  Constitutional:      Appearance: He is well-developed.  HENT:     Head: Normocephalic.     Nose: Nose normal.  Eyes:     General: No scleral icterus.    Conjunctiva/sclera: Conjunctivae normal.  Neck:     Thyroid: No thyromegaly.  Cardiovascular:     Rate and Rhythm: Normal rate and regular rhythm.     Heart sounds: No murmur heard. No friction rub. No gallop.   Pulmonary:     Breath sounds: No stridor. No wheezing or rales.  Chest:     Chest wall: No tenderness.  Abdominal:     General: There is no distension.     Tenderness: There is no abdominal tenderness. There is no rebound.  Genitourinary:    Comments: Rectal exam shows brown stool heme positive Musculoskeletal:        General: Normal range of motion.     Cervical back: Neck supple.  Lymphadenopathy:     Cervical: No cervical adenopathy.  Skin:    Findings: No erythema or rash.  Neurological:     Mental Status: He is alert and oriented to person, place, and time.     Motor: No abnormal muscle tone.     Coordination: Coordination normal.  Psychiatric:        Behavior: Behavior normal.     ED Results / Procedures / Treatments   Labs (all labs ordered are listed, but only abnormal results are displayed) Labs Reviewed  RESP PANEL BY RT-PCR (FLU A&B, COVID) ARPGX2 - Abnormal; Notable for the following components:      Result Value   SARS Coronavirus 2 by RT PCR POSITIVE (*)    All other components within normal limits  CBC WITH DIFFERENTIAL/PLATELET - Abnormal; Notable for the following components:   WBC 15.2 (*)    RBC 1.87 (*)    Hemoglobin 6.1 (*)    HCT 19.6 (*)    MCV 104.8 (*)    Neutro Abs 12.7 (*)    All other components within normal limits  COMPREHENSIVE METABOLIC PANEL - Abnormal; Notable for the following components:   Glucose, Bld 124 (*)    BUN 68 (*)    Creatinine, Ser 1.40 (*)    Total Protein 5.8 (*)    GFR, Estimated 48  (*)    All other components within normal limits  POC OCCULT BLOOD, ED - Abnormal; Notable for the  following components:   Fecal Occult Bld POSITIVE (*)    All other components within normal limits  LIPASE, BLOOD  URINALYSIS, ROUTINE W REFLEX MICROSCOPIC  VITAMIN B12  FOLATE  IRON AND TIBC  FERRITIN  RETICULOCYTES  TYPE AND SCREEN  PREPARE RBC (CROSSMATCH)    EKG None  Radiology No results found.  Procedures Procedures   Medications Ordered in ED Medications  0.9 %  sodium chloride infusion (has no administration in time range)  pantoprazole (PROTONIX) 80 mg in sodium chloride 0.9 % 100 mL IVPB (has no administration in time range)  pantoprazole (PROTONIX) 80 mg in sodium chloride 0.9 % 100 mL (0.8 mg/mL) infusion (has no administration in time range)  pantoprazole (PROTONIX) injection 40 mg (has no administration in time range)  sodium chloride 0.9 % bolus 1,000 mL (1,000 mLs Intravenous New Bag/Given 06/25/20 1154)  ondansetron (ZOFRAN) injection 4 mg (4 mg Intravenous Given 06/25/20 1154)    ED Course  I have reviewed the triage vital signs and the nursing notes.  Pertinent labs & imaging results that were available during my care of the patient were reviewed by me and considered in my medical decision making (see chart for details). CRITICAL CARE Performed by: Milton Ferguson Total critical care time: 37minutes Critical care time was exclusive of separately billable procedures and treating other patients. Critical care was necessary to treat or prevent imminent or life-threatening deterioration. Critical care was time spent personally by me on the following activities: development of treatment plan with patient and/or surrogate as well as nursing, discussions with consultants, evaluation of patient's response to treatment, examination of patient, obtaining history from patient or surrogate, ordering and performing treatments and interventions, ordering and review of  laboratory studies, ordering and review of radiographic studies, pulse oximetry and re-evaluation of patient's condition.    MDM Rules/Calculators/A&P                          Patient with an upper GI bleed.  He is put on a Protonix drip and will be transfused 2 units along with a GI consult and medicine admission.  Patient is also Covid positive Final Clinical Impression(s) / ED Diagnoses Final diagnoses:  Upper GI bleed    Rx / DC Orders ED Discharge Orders    None       Milton Ferguson, MD 06/25/20 1306

## 2020-06-25 NOTE — Progress Notes (Addendum)
Notified by RN about critical troponin value 1556 -patient does not have any cp presently -case discussed with cardiology, Dr. Chilton Si is not a candidate for anticoagulation or any aggressive cardiac intervention-->no need to transfer to [facility] -trend troponins and obtain Echo -patient's family including son and spouse updated on phone -[place] and code status discussed with patient, son, spouse-->continue full scope of care -GI, Dr. Abbey Chatters also updated on current status and changes -2 units PRBCs ordered -personally reviewed EKG--sinus, RBBB, essentially unchanged from prior  Jon Reid , DO

## 2020-06-25 NOTE — ED Notes (Signed)
Date and time results received: 06/25/20 & 1215  Test: hemoglobin  Critical Value: 6.1  Name of Provider Notified: Dr. Roderic Palau  Orders Received? Or Actions Taken?: Notified

## 2020-06-25 NOTE — Consult Note (Signed)
Consulting  Provider: Dr. Roderic Palau Primary Care Physician:  Claretta Fraise, MD Primary Gastroenterologist:  None  Reason for Consultation:  Upper GI bleeding  HPI:  Jon Reid is a 87 y.o. male with a past medical history of chronic kidney disease, CAD, GERD, previous stroke, who presented to [facility], ER earlier today with chief complaint of generalized weakness/fatigue as well as melena.  Patient states for the last 3 to 4 weeks he has had black "gross looking" stools.  Also has had a few episodes of vomiting which she describes as dark.  No gross hematochezia.  No abdominal pain.  No history of H. pylori peptic ulcer disease that he knows of.  Does chronically take Celebrex.  No other NSAIDs that he knows of.  Does take Protonix once daily at home.  No dysphagia odynophagia.  In the ER, hemoglobin found to be 6.1 with baseline around 13.  BUN significantly elevated at 68, creatinine 1.4 around baseline.  Hemodynamically okay, no tachycardia or hypotension.  Fecal occult positive.  Past Medical History:  Diagnosis Date  . Arthritis   . Carcinoma of prostate [facility])    prostate  . CKD (chronic kidney disease) stage 2, GFR 60-89 ml/min   . Coronary artery disease    a. LHC (8/14):  3v CAD => s/p CABG (LIMA-LAD, SVG-OM1, SVG-PDA)  . First degree AV block 01/28/2019   Noted on EKG  . GERD (gastroesophageal reflux disease)   . Gout   . HTN (hypertension)   . Hx of echocardiogram    Echocardiogram 11/13/12: EF 55-60%, normal wall motion, biatrial enlargement, mild AI, mild TR, no pericardial effusion  . Macrocytosis without anemia 01/08/2012  . Moderate aortic stenosis 12/06/2017   Noted on ECHO  . [name] affect 02/09/2020  . 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 Sleep apnea    Questionable  . Spinal stenosis   . Stroke [facility])     Past Surgical History:  Procedure Laterality Date  . CARDIAC CATHETERIZATION  11/07/2012   Dr  Acie Fredrickson  . CATARACT EXTRACTION W/ INTRAOCULAR LENS IMPLANT Bilateral   . CORONARY ARTERY BYPASS GRAFT N/A 11/11/2012   Procedure: CORONARY ARTERY BYPASS GRAFTING times four using Right Greater Saphenous Vein Graft harvested endoscopically and Left Internal Mammary Artery.;  Surgeon: Ivin Poot, MD;  Location: [place];  Service: Open Heart Surgery;  Laterality: N/A;  . INGUINAL HERNIA REPAIR Right 01/10/2017   Procedure: OPEN RIGHT HERNIA REPAIR INGUINAL;  Surgeon: Ileana Roup, MD;  Location: [facility];  Service: General;  Laterality: Right;  . INSERTION OF MESH Right 01/10/2017   Procedure: INSERTION OF MESH;  Surgeon: Ileana Roup, MD;  Location: [facility];  Service: General;  Laterality: Right;  . INTRAOPERATIVE TRANSESOPHAGEAL ECHOCARDIOGRAM N/A 11/11/2012   Procedure: INTRAOPERATIVE TRANSESOPHAGEAL ECHOCARDIOGRAM;  Surgeon: Ivin Poot, MD;  Location: [place];  Service: Open Heart Surgery;  Laterality: N/A;  . LEFT HEART CATHETERIZATION WITH CORONARY ANGIOGRAM N/A 11/07/2012   Procedure: LEFT HEART CATHETERIZATION WITH CORONARY ANGIOGRAM;  Surgeon: Thayer Headings, MD;  Location: [facility] CATH LAB;  Service: Cardiovascular;  Laterality: N/A;  . LUMBAR LAMINECTOMY/DECOMPRESSION MICRODISCECTOMY N/A 02/05/2019   Procedure: LUMBAR LAMINECTOMY/DECOMPRESSION L3-L4;  Surgeon: Latanya Maudlin, MD;  Location: [facility];  Service: Orthopedics;  Laterality: N/A;  29min  . LYMPH NODE DISSECTION     Bilateral pelvic  . RETROPUBIC PROSTATECTOMY     Radical  Prior to Admission medications   Medication Sig Start Date End Date Taking? Authorizing Provider  aspirin EC 81 MG tablet Take 81 mg by mouth daily.    [provider]  carbidopa-levodopa (SINEMET IR) 25-100 MG tablet 1/2 tablet three times a day for 4 weeks, then take one tablet three times a day 05/31/20   Kathrynn Ducking, MD  celecoxib (CELEBREX) 400 MG capsule Take 1 capsule (400 mg total) by mouth daily. With food 06/21/20    Claretta Fraise, MD  Cholecalciferol (VITAMIN D) 2000 units CAPS Take 2,000 Units by mouth daily.    [provider]  diclofenac sodium (VOLTAREN) 1 % GEL Apply 2 g topically 4 (four) times daily. 11/07/18   Baruch Gouty, FNP  fluticasone Asencion Islam) 50 MCG/ACT nasal spray One to 2 sprays each nostril at bedtime 10/12/14   Chipper Herb, MD  furosemide (LASIX) 40 MG tablet Take 1 tablet (40 mg total) by mouth daily. 09/29/19   Claretta Fraise, MD  Iron, Ferrous Sulfate, 325 (65 Fe) MG TABS Take 1 tablet by mouth every Monday, Wednesday, and Friday. 09/30/19   Claretta Fraise, MD  nitroGLYCERIN (NITROSTAT) 0.4 MG SL tablet Place 1 tablet (0.4 mg total) under the tongue every 5 (five) minutes as needed for chest pain. 07/20/15   Erlene Quan, PA-C  pantoprazole (PROTONIX) 40 MG tablet TAKE 1 TABLET ONCE DAILY FOR REFLUX 02/28/20   Claretta Fraise, MD  predniSONE (DELTASONE) 10 MG tablet Take 5 daily for 3 days followed by 4,3,2 and 1 for 3 days each. 06/21/20   Claretta Fraise, MD  triamcinolone cream (KENALOG) 0.1 % Apply 1 application topically 2 (two) times daily. Patient taking differently: Apply 1 application topically daily as needed (itching). 04/30/17   Chipper Herb, MD    Current Facility-Administered Medications  Medication Dose Route Frequency Provider Last Rate Last Admin  . 0.9 %  sodium chloride infusion  10 mL/hr Intravenous Once Milton Ferguson, MD      . pantoprazole (PROTONIX) 80 mg in sodium chloride 0.9 % 100 mL (0.8 mg/mL) infusion  8 mg/hr Intravenous Continuous Milton Ferguson, MD      . pantoprazole (PROTONIX) 80 mg in sodium chloride 0.9 % 100 mL IVPB  80 mg Intravenous Once Milton Ferguson, MD      . Derrill Memo ON 06/29/2020] pantoprazole (PROTONIX) injection 40 mg  40 mg Intravenous Q12H Milton Ferguson, MD       Current Outpatient Medications  Medication Sig Dispense Refill  . aspirin EC 81 MG tablet Take 81 mg by mouth daily.    . carbidopa-levodopa (SINEMET IR) 25-100 MG  tablet 1/2 tablet three times a day for 4 weeks, then take one tablet three times a day 90 tablet 3  . celecoxib (CELEBREX) 400 MG capsule Take 1 capsule (400 mg total) by mouth daily. With food 30 capsule 5  . Cholecalciferol (VITAMIN D) 2000 units CAPS Take 2,000 Units by mouth daily.    . diclofenac sodium (VOLTAREN) 1 % GEL Apply 2 g topically 4 (four) times daily. 150 g 3  . fluticasone (FLONASE) 50 MCG/ACT nasal spray One to 2 sprays each nostril at bedtime 16 g 6  . furosemide (LASIX) 40 MG tablet Take 1 tablet (40 mg total) by mouth daily. 90 tablet 0  . Iron, Ferrous Sulfate, 325 (65 Fe) MG TABS Take 1 tablet by mouth every Monday, Wednesday, and Friday. 30 tablet 6  . nitroGLYCERIN (NITROSTAT) 0.4 MG SL tablet Place 1  tablet (0.4 mg total) under the tongue every 5 (five) minutes as needed for chest pain. 25 tablet PRN  . pantoprazole (PROTONIX) 40 MG tablet TAKE 1 TABLET ONCE DAILY FOR REFLUX 90 tablet 1  . predniSONE (DELTASONE) 10 MG tablet Take 5 daily for 3 days followed by 4,3,2 and 1 for 3 days each. 45 tablet 0  . triamcinolone cream (KENALOG) 0.1 % Apply 1 application topically 2 (two) times daily. (Patient taking differently: Apply 1 application topically daily as needed (itching).) 30 g 3    Allergies as of 06/25/2020 - Review Complete 06/25/2020  Allergen Reaction Noted  . Crestor [rosuvastatin]  09/03/2013    Family History  Problem Relation Age of Onset  . Aneurysm Father        Cerebral  . Stroke Father   . Hypertension Father   . Cancer Mother        colon  . Parkinson's disease Son     Social History   Socioeconomic History  . Marital status: Married    Spouse name: [name]  . Number of children: 3  . Years of education: 78  . Highest education level: High school graduate  Occupational History  . Occupation: Retired  Tobacco Use  . Smoking status: Never Smoker  . Smokeless tobacco: Never Used  Vaping Use  . Vaping Use: Never used  Substance and  Sexual Activity  . Alcohol use: Yes    Comment: Rare  . Drug use: No  . Sexual activity: Not Currently  Other Topics Concern  . Not on file  Social History Narrative   Lives at home with wife/sons will stay with them occassionally   Married with 3 children   Denies caffeine use    Social Determinants of Radio broadcast assistant Strain: Not on file  Food Insecurity: Not on file  Transportation Needs: Not on file  Physical Activity: Not on file  Stress: Not on file  Social Connections: Not on file  Intimate Partner Violence: Not on file    Review of Systems: General: Negative for anorexia, weight loss, fever, chills, fatigue, weakness. Eyes: Negative for vision changes.  ENT: Negative for hoarseness, difficulty swallowing , nasal congestion. CV: Negative for chest pain, angina, palpitations, dyspnea on exertion, peripheral edema.  Respiratory: Negative for dyspnea at rest, dyspnea on exertion, cough, sputum, wheezing.  GI: See history of present illness. GU:  Negative for dysuria, hematuria, urinary incontinence, urinary frequency, nocturnal urination.  MS: Negative for joint pain, low back pain.  Derm: Negative for rash or itching.  Neuro: Negative for weakness, abnormal sensation, seizure, frequent headaches, memory loss, confusion.  Psych: Negative for anxiety, depression Endo: Negative for unusual weight change.  Heme: Negative for bruising or bleeding. Allergy: Negative for rash or hives.  Physical Exam: Vital signs in last 24 hours: Temp:  [99 F (37.2 C)] 99 F (37.2 C) (04/09 1131) Pulse Rate:  [79-81] 79 (04/09 1200) Resp:  [15-18] 15 (04/09 1200) BP: (108-111)/(48-52) 108/52 (04/09 1200) SpO2:  [100 %] 100 % (04/09 1200) Weight:  [70 kg] 70 kg (04/09 1118)   General:   Alert,  Well-developed, well-nourished, pleasant and cooperative in NAD Head:  Normocephalic and atraumatic. Eyes:  Sclera clear, no icterus.   Conjunctiva pink. Ears:  Normal auditory  acuity. Nose:  No deformity, discharge,  or lesions. Mouth:  No deformity or lesions, dentition normal. Neck:  Supple; no masses or thyromegaly. Lungs:  Clear throughout to auscultation.   No wheezes, crackles,  or rhonchi. No acute distress. Heart:  Regular rate and rhythm; no murmurs, clicks, rubs,  or gallops. Abdomen:  Soft, nontender and nondistended. No masses, hepatosplenomegaly or hernias noted. Normal bowel sounds, without guarding, and without rebound.   Rectal:  Deferred until time of colonoscopy.   Msk:  Symmetrical without gross deformities. Normal posture. Pulses:  Normal pulses noted. Extremities:  Without clubbing or edema. Neurologic:  Alert and  oriented x4;  grossly normal neurologically. Skin:  Intact without significant lesions or rashes. Cervical Nodes:  No significant cervical adenopathy. Psych:  Alert and cooperative. Normal mood and affect.  Intake/Output from previous day: No intake/output data recorded. Intake/Output this shift: No intake/output data recorded.  Lab Results: Recent Labs    06/25/20 1142  WBC 15.2*  HGB 6.1*  HCT 19.6*  PLT 226   BMET Recent Labs    06/25/20 1142  NA 138  K 4.0  CL 105  CO2 23  GLUCOSE 124*  BUN 68*  CREATININE 1.40*  CALCIUM 9.5   LFT Recent Labs    06/25/20 1142  PROT 5.8*  ALBUMIN 3.7  AST 27  ALT 19  ALKPHOS 42  BILITOT 0.8   PT/INR No results for input(s): LABPROT, INR in the last 72 hours. Hepatitis Panel No results for input(s): HEPBSAG, HCVAB, HEPAIGM, HEPBIGM in the last 72 hours. C-Diff No results for input(s): CDIFFTOX in the last 72 hours.  Studies/Results: No results found.  Impression: *Upper GI bleeding-source unclear *Acute blood loss anemia due to above *Chronic GERD *Chronic NSAID use  Plan: Etiology of patient's upper GI bleeding unclear.  Does chronically take Celebrex so peptic ulcer disease is certainly on differential.  Other potential causes include gastritis,  duodenitis, Mallory-Weiss tear, AVMs, Dieulafoy lesion, polyps, malignancy, or other.  Discussed further evaluation with EGD and patient would like to proceed.  Discussed case with anesthesia as well who agrees with adequate resuscitation prior to anesthesia with PRBCs and IVF.  2 units have been ordered by ER physician.  Continue to monitor hemoglobin and transfuse for less than 7 or evidence of active bleeding or hemodynamic instability.  Agree with IV Protonix twice daily.  We will tentatively plan on EGD in the a.m. to further evaluate.  If patient's clinical condition changes this evening please page GI at [phone].  Thank you for letting us take part in this patient's care.  Elon Alas. Abbey Chatters, D.O. Gastroenterology and Hepatology [facility]    LOS: 0 days     06/25/2020, 1:01 PM

## 2020-06-25 NOTE — ED Notes (Signed)
Date and time results received: 06/25/20 & 1252   Test: Covid  Critical Value: Positive  Name of Provider Notified: EDP  Orders Received? Or Actions Taken?: Notified

## 2020-06-25 NOTE — ED Notes (Signed)
GI Dr in to visit. Verbal order clear liquid diet today and NPO after midnight,

## 2020-06-25 NOTE — H&P (View-Only) (Signed)
Consulting  Provider: Dr. Roderic Palau Primary Care Physician:  Claretta Fraise, MD Primary Gastroenterologist:  None  Reason for Consultation:  Upper GI bleeding  HPI:  Jon Reid is a 87 y.o. male with a past medical history of chronic kidney disease, CAD, GERD, previous stroke, who presented to [facility], ER earlier today with chief complaint of generalized weakness/fatigue as well as melena.  Patient states for the last 3 to 4 weeks he has had black "gross looking" stools.  Also has had a few episodes of vomiting which she describes as dark.  No gross hematochezia.  No abdominal pain.  No history of H. pylori peptic ulcer disease that he knows of.  Does chronically take Celebrex.  No other NSAIDs that he knows of.  Does take Protonix once daily at home.  No dysphagia odynophagia.  In the ER, hemoglobin found to be 6.1 with baseline around 13.  BUN significantly elevated at 68, creatinine 1.4 around baseline.  Hemodynamically okay, no tachycardia or hypotension.  Fecal occult positive.  Past Medical History:  Diagnosis Date  . Arthritis   . Carcinoma of prostate [facility])    prostate  . CKD (chronic kidney disease) stage 2, GFR 60-89 ml/min   . Coronary artery disease    a. LHC (8/14):  3v CAD => s/p CABG (LIMA-LAD, SVG-OM1, SVG-PDA)  . First degree AV block 01/28/2019   Noted on EKG  . GERD (gastroesophageal reflux disease)   . Gout   . HTN (hypertension)   . Hx of echocardiogram    Echocardiogram 11/13/12: EF 55-60%, normal wall motion, biatrial enlargement, mild AI, mild TR, no pericardial effusion  . Macrocytosis without anemia 01/08/2012  . Moderate aortic stenosis 12/06/2017   Noted on ECHO  . [name] affect 02/09/2020  . 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 Sleep apnea    Questionable  . Spinal stenosis   . Stroke [facility])     Past Surgical History:  Procedure Laterality Date  . CARDIAC CATHETERIZATION  11/07/2012   Dr  Acie Fredrickson  . CATARACT EXTRACTION W/ INTRAOCULAR LENS IMPLANT Bilateral   . CORONARY ARTERY BYPASS GRAFT N/A 11/11/2012   Procedure: CORONARY ARTERY BYPASS GRAFTING times four using Right Greater Saphenous Vein Graft harvested endoscopically and Left Internal Mammary Artery.;  Surgeon: Ivin Poot, MD;  Location: [place];  Service: Open Heart Surgery;  Laterality: N/A;  . INGUINAL HERNIA REPAIR Right 01/10/2017   Procedure: OPEN RIGHT HERNIA REPAIR INGUINAL;  Surgeon: Ileana Roup, MD;  Location: [facility];  Service: General;  Laterality: Right;  . INSERTION OF MESH Right 01/10/2017   Procedure: INSERTION OF MESH;  Surgeon: Ileana Roup, MD;  Location: [facility];  Service: General;  Laterality: Right;  . INTRAOPERATIVE TRANSESOPHAGEAL ECHOCARDIOGRAM N/A 11/11/2012   Procedure: INTRAOPERATIVE TRANSESOPHAGEAL ECHOCARDIOGRAM;  Surgeon: Ivin Poot, MD;  Location: [place];  Service: Open Heart Surgery;  Laterality: N/A;  . LEFT HEART CATHETERIZATION WITH CORONARY ANGIOGRAM N/A 11/07/2012   Procedure: LEFT HEART CATHETERIZATION WITH CORONARY ANGIOGRAM;  Surgeon: Thayer Headings, MD;  Location: [facility] CATH LAB;  Service: Cardiovascular;  Laterality: N/A;  . LUMBAR LAMINECTOMY/DECOMPRESSION MICRODISCECTOMY N/A 02/05/2019   Procedure: LUMBAR LAMINECTOMY/DECOMPRESSION L3-L4;  Surgeon: Latanya Maudlin, MD;  Location: [facility];  Service: Orthopedics;  Laterality: N/A;  37min  . LYMPH NODE DISSECTION     Bilateral pelvic  . RETROPUBIC PROSTATECTOMY     Radical  Prior to Admission medications   Medication Sig Start Date End Date Taking? Authorizing Provider  aspirin EC 81 MG tablet Take 81 mg by mouth daily.    [provider]  carbidopa-levodopa (SINEMET IR) 25-100 MG tablet 1/2 tablet three times a day for 4 weeks, then take one tablet three times a day 05/31/20   Kathrynn Ducking, MD  celecoxib (CELEBREX) 400 MG capsule Take 1 capsule (400 mg total) by mouth daily. With food 06/21/20    Claretta Fraise, MD  Cholecalciferol (VITAMIN D) 2000 units CAPS Take 2,000 Units by mouth daily.    [provider]  diclofenac sodium (VOLTAREN) 1 % GEL Apply 2 g topically 4 (four) times daily. 11/07/18   Baruch Gouty, FNP  fluticasone Asencion Islam) 50 MCG/ACT nasal spray One to 2 sprays each nostril at bedtime 10/12/14   Chipper Herb, MD  furosemide (LASIX) 40 MG tablet Take 1 tablet (40 mg total) by mouth daily. 09/29/19   Claretta Fraise, MD  Iron, Ferrous Sulfate, 325 (65 Fe) MG TABS Take 1 tablet by mouth every Monday, Wednesday, and Friday. 09/30/19   Claretta Fraise, MD  nitroGLYCERIN (NITROSTAT) 0.4 MG SL tablet Place 1 tablet (0.4 mg total) under the tongue every 5 (five) minutes as needed for chest pain. 07/20/15   Erlene Quan, PA-C  pantoprazole (PROTONIX) 40 MG tablet TAKE 1 TABLET ONCE DAILY FOR REFLUX 02/28/20   Claretta Fraise, MD  predniSONE (DELTASONE) 10 MG tablet Take 5 daily for 3 days followed by 4,3,2 and 1 for 3 days each. 06/21/20   Claretta Fraise, MD  triamcinolone cream (KENALOG) 0.1 % Apply 1 application topically 2 (two) times daily. Patient taking differently: Apply 1 application topically daily as needed (itching). 04/30/17   Chipper Herb, MD    Current Facility-Administered Medications  Medication Dose Route Frequency Provider Last Rate Last Admin  . 0.9 %  sodium chloride infusion  10 mL/hr Intravenous Once Milton Ferguson, MD      . pantoprazole (PROTONIX) 80 mg in sodium chloride 0.9 % 100 mL (0.8 mg/mL) infusion  8 mg/hr Intravenous Continuous Milton Ferguson, MD      . pantoprazole (PROTONIX) 80 mg in sodium chloride 0.9 % 100 mL IVPB  80 mg Intravenous Once Milton Ferguson, MD      . Derrill Memo ON 06/29/2020] pantoprazole (PROTONIX) injection 40 mg  40 mg Intravenous Q12H Milton Ferguson, MD       Current Outpatient Medications  Medication Sig Dispense Refill  . aspirin EC 81 MG tablet Take 81 mg by mouth daily.    . carbidopa-levodopa (SINEMET IR) 25-100 MG  tablet 1/2 tablet three times a day for 4 weeks, then take one tablet three times a day 90 tablet 3  . celecoxib (CELEBREX) 400 MG capsule Take 1 capsule (400 mg total) by mouth daily. With food 30 capsule 5  . Cholecalciferol (VITAMIN D) 2000 units CAPS Take 2,000 Units by mouth daily.    . diclofenac sodium (VOLTAREN) 1 % GEL Apply 2 g topically 4 (four) times daily. 150 g 3  . fluticasone (FLONASE) 50 MCG/ACT nasal spray One to 2 sprays each nostril at bedtime 16 g 6  . furosemide (LASIX) 40 MG tablet Take 1 tablet (40 mg total) by mouth daily. 90 tablet 0  . Iron, Ferrous Sulfate, 325 (65 Fe) MG TABS Take 1 tablet by mouth every Monday, Wednesday, and Friday. 30 tablet 6  . nitroGLYCERIN (NITROSTAT) 0.4 MG SL tablet Place 1  tablet (0.4 mg total) under the tongue every 5 (five) minutes as needed for chest pain. 25 tablet PRN  . pantoprazole (PROTONIX) 40 MG tablet TAKE 1 TABLET ONCE DAILY FOR REFLUX 90 tablet 1  . predniSONE (DELTASONE) 10 MG tablet Take 5 daily for 3 days followed by 4,3,2 and 1 for 3 days each. 45 tablet 0  . triamcinolone cream (KENALOG) 0.1 % Apply 1 application topically 2 (two) times daily. (Patient taking differently: Apply 1 application topically daily as needed (itching).) 30 g 3    Allergies as of 06/25/2020 - Review Complete 06/25/2020  Allergen Reaction Noted  . Crestor [rosuvastatin]  09/03/2013    Family History  Problem Relation Age of Onset  . Aneurysm Father        Cerebral  . Stroke Father   . Hypertension Father   . Cancer Mother        colon  . Parkinson's disease Son     Social History   Socioeconomic History  . Marital status: Married    Spouse name: [name]  . Number of children: 3  . Years of education: 41  . Highest education level: High school graduate  Occupational History  . Occupation: Retired  Tobacco Use  . Smoking status: Never Smoker  . Smokeless tobacco: Never Used  Vaping Use  . Vaping Use: Never used  Substance and  Sexual Activity  . Alcohol use: Yes    Comment: Rare  . Drug use: No  . Sexual activity: Not Currently  Other Topics Concern  . Not on file  Social History Narrative   Lives at home with wife/sons will stay with them occassionally   Married with 3 children   Denies caffeine use    Social Determinants of Radio broadcast assistant Strain: Not on file  Food Insecurity: Not on file  Transportation Needs: Not on file  Physical Activity: Not on file  Stress: Not on file  Social Connections: Not on file  Intimate Partner Violence: Not on file    Review of Systems: General: Negative for anorexia, weight loss, fever, chills, fatigue, weakness. Eyes: Negative for vision changes.  ENT: Negative for hoarseness, difficulty swallowing , nasal congestion. CV: Negative for chest pain, angina, palpitations, dyspnea on exertion, peripheral edema.  Respiratory: Negative for dyspnea at rest, dyspnea on exertion, cough, sputum, wheezing.  GI: See history of present illness. GU:  Negative for dysuria, hematuria, urinary incontinence, urinary frequency, nocturnal urination.  MS: Negative for joint pain, low back pain.  Derm: Negative for rash or itching.  Neuro: Negative for weakness, abnormal sensation, seizure, frequent headaches, memory loss, confusion.  Psych: Negative for anxiety, depression Endo: Negative for unusual weight change.  Heme: Negative for bruising or bleeding. Allergy: Negative for rash or hives.  Physical Exam: Vital signs in last 24 hours: Temp:  [99 F (37.2 C)] 99 F (37.2 C) (04/09 1131) Pulse Rate:  [79-81] 79 (04/09 1200) Resp:  [15-18] 15 (04/09 1200) BP: (108-111)/(48-52) 108/52 (04/09 1200) SpO2:  [100 %] 100 % (04/09 1200) Weight:  [70 kg] 70 kg (04/09 1118)   General:   Alert,  Well-developed, well-nourished, pleasant and cooperative in NAD Head:  Normocephalic and atraumatic. Eyes:  Sclera clear, no icterus.   Conjunctiva pink. Ears:  Normal auditory  acuity. Nose:  No deformity, discharge,  or lesions. Mouth:  No deformity or lesions, dentition normal. Neck:  Supple; no masses or thyromegaly. Lungs:  Clear throughout to auscultation.   No wheezes, crackles,  or rhonchi. No acute distress. Heart:  Regular rate and rhythm; no murmurs, clicks, rubs,  or gallops. Abdomen:  Soft, nontender and nondistended. No masses, hepatosplenomegaly or hernias noted. Normal bowel sounds, without guarding, and without rebound.   Rectal:  Deferred until time of colonoscopy.   Msk:  Symmetrical without gross deformities. Normal posture. Pulses:  Normal pulses noted. Extremities:  Without clubbing or edema. Neurologic:  Alert and  oriented x4;  grossly normal neurologically. Skin:  Intact without significant lesions or rashes. Cervical Nodes:  No significant cervical adenopathy. Psych:  Alert and cooperative. Normal mood and affect.  Intake/Output from previous day: No intake/output data recorded. Intake/Output this shift: No intake/output data recorded.  Lab Results: Recent Labs    06/25/20 1142  WBC 15.2*  HGB 6.1*  HCT 19.6*  PLT 226   BMET Recent Labs    06/25/20 1142  NA 138  K 4.0  CL 105  CO2 23  GLUCOSE 124*  BUN 68*  CREATININE 1.40*  CALCIUM 9.5   LFT Recent Labs    06/25/20 1142  PROT 5.8*  ALBUMIN 3.7  AST 27  ALT 19  ALKPHOS 42  BILITOT 0.8   PT/INR No results for input(s): LABPROT, INR in the last 72 hours. Hepatitis Panel No results for input(s): HEPBSAG, HCVAB, HEPAIGM, HEPBIGM in the last 72 hours. C-Diff No results for input(s): CDIFFTOX in the last 72 hours.  Studies/Results: No results found.  Impression: *Upper GI bleeding-source unclear *Acute blood loss anemia due to above *Chronic GERD *Chronic NSAID use  Plan: Etiology of patient's upper GI bleeding unclear.  Does chronically take Celebrex so peptic ulcer disease is certainly on differential.  Other potential causes include gastritis,  duodenitis, Mallory-Weiss tear, AVMs, Dieulafoy lesion, polyps, malignancy, or other.  Discussed further evaluation with EGD and patient would like to proceed.  Discussed case with anesthesia as well who agrees with adequate resuscitation prior to anesthesia with PRBCs and IVF.  2 units have been ordered by ER physician.  Continue to monitor hemoglobin and transfuse for less than 7 or evidence of active bleeding or hemodynamic instability.  Agree with IV Protonix twice daily.  We will tentatively plan on EGD in the a.m. to further evaluate.  If patient's clinical condition changes this evening please page GI at [phone].  Thank you for letting us take part in this patient's care.  Elon Alas. Abbey Chatters, D.O. Gastroenterology and Hepatology [facility]    LOS: 0 days     06/25/2020, 1:01 PM

## 2020-06-25 NOTE — ED Notes (Signed)
Date and time results received: 06/25/20 & 1400   Test: Troponin  Critical Value: 1556  Name of Provider Notified: Dr. Carles Collet  Orders Received? Or Actions Taken?: EKG

## 2020-06-25 NOTE — ED Notes (Signed)
Updated wife Jushua Waltman on condition.

## 2020-06-25 NOTE — Progress Notes (Signed)
Date and time results received: 06/25/20 1558 (use smartphrase ".now" to insert current time)  Test: Troponin Critical Value: [place]  Name of Provider Notified: Tat MD  Orders Received? Or Actions Taken?:N/A

## 2020-06-25 NOTE — H&P (Signed)
History and Physical  CEASER EBELING KVQ:[id number] DOB: 06-27-1928 DOA: 06/25/2020   PCP: Claretta Fraise, MD   Patient coming from: Home  Chief Complaint: n/v, generalized weakness  HPI:  Jon Reid is a 87 y.o. male with medical history of knee pain, prostate adenocarcinoma, coronary artery disease, CKD stage III, hypertension, stroke, aortic stenosis, right bundle branch block presenting with nausea, vomiting, and generalized weakness.  The patient states that he has had nausea and vomiting for the better part of 2 weeks.  His generalized weakness has gradually worsened during this time to the point where he was having difficulty getting out of bed on the morning of 06/25/2020.  As result, the patient was brought to the emergency department for further evaluation.  He states that his emesis is very dark looking almost coffee-ground like.  He denies any bright red blood with emesis.  He denies any diarrhea, hematochezia, but admits to having dark/black stools.  He has had some subjective fevers and chills.  He states that he has been taking Celebrex for at least 1 month.  His dose of Celebrex was recently increased on 06/21/2020.  He denies any other over-the-counter NSAIDs.  He has had some exertional chest discomfort and shortness of breath over the past week.  He denies any coughing or hemoptysis.  There is no dysuria or hematuria.  He states that he has been vaccinated x3 for COVID-19.  In the emergency department, the patient had a low-grade temperature 99.0 F.  He was hemodynamically stable with oxygen saturation of 100% on room air.  BMP was essentially unremarkable with serum creatinine of 1.40 which is near his baseline.  WBC 15.2, hemoglobin 6.1, platelets 226,000.  2 units PRBC were ordered.  GI was consulted to assist with management.  FOBT with brown stool, heme +  Assessment/Plan: Upper GI bleed/symptomatic anemia -The patient endorses Celebrex use -GI consult -Plans noted for  possible EGD -Start Protonix drip -2 units PRBC have been ordered  COVID-19 Infection -Infection obtain chest x-ray -Start remdesivir -Stable on room air -Check inflammatory markers -Vitamin C and zinc  Coronary artery disease/chest pain -Cycle troponins -Obtain EKG  CKD stage IIIb -Baseline creatinine 1.4-1.6 -Monitor BMP -Holding furosemide  Leukocytosis -Check PCT -Check chest x-ray -UA and urine culture -Blood cultures x2 sets  Aortic stenosis -Outpatient cardiology follow-up  History of stroke -Holding aspirin in the setting of GI bleed        Past Medical History:  Diagnosis Date  . Arthritis   . Carcinoma of prostate [facility])    prostate  . CKD (chronic kidney disease) stage 2, GFR 60-89 ml/min   . Coronary artery disease    a. LHC (8/14):  3v CAD => s/p CABG (LIMA-LAD, SVG-OM1, SVG-PDA)  . First degree AV block 01/28/2019   Noted on EKG  . GERD (gastroesophageal reflux disease)   . Gout   . HTN (hypertension)   . Hx of echocardiogram    Echocardiogram 11/13/12: EF 55-60%, normal wall motion, biatrial enlargement, mild AI, mild TR, no pericardial effusion  . Macrocytosis without anemia 01/08/2012  . Moderate aortic stenosis 12/06/2017   Noted on ECHO  . [name] affect 02/09/2020  . 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 Sleep apnea    Questionable  . Spinal stenosis   . Stroke [facility])    Past Surgical History:  Procedure Laterality Date  .  CARDIAC CATHETERIZATION  11/07/2012   Dr Acie Fredrickson  . CATARACT EXTRACTION W/ INTRAOCULAR LENS IMPLANT Bilateral   . CORONARY ARTERY BYPASS GRAFT N/A 11/11/2012   Procedure: CORONARY ARTERY BYPASS GRAFTING times four using Right Greater Saphenous Vein Graft harvested endoscopically and Left Internal Mammary Artery.;  Surgeon: Ivin Poot, MD;  Location: [place];  Service: Open Heart Surgery;  Laterality: N/A;  . INGUINAL HERNIA REPAIR Right 01/10/2017    Procedure: OPEN RIGHT HERNIA REPAIR INGUINAL;  Surgeon: Ileana Roup, MD;  Location: [facility];  Service: General;  Laterality: Right;  . INSERTION OF MESH Right 01/10/2017   Procedure: INSERTION OF MESH;  Surgeon: Ileana Roup, MD;  Location: [facility];  Service: General;  Laterality: Right;  . INTRAOPERATIVE TRANSESOPHAGEAL ECHOCARDIOGRAM N/A 11/11/2012   Procedure: INTRAOPERATIVE TRANSESOPHAGEAL ECHOCARDIOGRAM;  Surgeon: Ivin Poot, MD;  Location: [place];  Service: Open Heart Surgery;  Laterality: N/A;  . LEFT HEART CATHETERIZATION WITH CORONARY ANGIOGRAM N/A 11/07/2012   Procedure: LEFT HEART CATHETERIZATION WITH CORONARY ANGIOGRAM;  Surgeon: Thayer Headings, MD;  Location[facility] CATH LAB;  Service: Cardiovascular;  Laterality: N/A;  . LUMBAR LAMINECTOMY/DECOMPRESSION MICRODISCECTOMY N/A 02/05/2019   Procedure: LUMBAR LAMINECTOMY/DECOMPRESSION L3-L4;  Surgeon: Latanya Maudlin, MD;  Location: [facility];  Service: Orthopedics;  Laterality: N/A;  81min  . LYMPH NODE DISSECTION     Bilateral pelvic  . RETROPUBIC PROSTATECTOMY     Radical   Social History:  reports that he has never smoked. He has never used smokeless tobacco. He reports current alcohol use. He reports that he does not use drugs.   Family History  Problem Relation Age of Onset  . Aneurysm Father        Cerebral  . Stroke Father   . Hypertension Father   . Cancer Mother        colon  . Parkinson's disease Son      Allergies  Allergen Reactions  . Crestor [Rosuvastatin]     GYNECOMASTIA AND SWELLING     Prior to Admission medications   Medication Sig Start Date End Date Taking? Authorizing Provider  aspirin EC 81 MG tablet Take 81 mg by mouth daily.    [provider]  carbidopa-levodopa (SINEMET IR) 25-100 MG tablet 1/2 tablet three times a day for 4 weeks, then take one tablet three times a day 05/31/20   Kathrynn Ducking, MD  celecoxib (CELEBREX) 400 MG capsule Take 1 capsule (400 mg total) by  mouth daily. With food 06/21/20   Claretta Fraise, MD  Cholecalciferol (VITAMIN D) 2000 units CAPS Take 2,000 Units by mouth daily.    [provider]  diclofenac sodium (VOLTAREN) 1 % GEL Apply 2 g topically 4 (four) times daily. 11/07/18   Baruch Gouty, FNP  fluticasone Asencion Islam) 50 MCG/ACT nasal spray One to 2 sprays each nostril at bedtime 10/12/14   Chipper Herb, MD  furosemide (LASIX) 40 MG tablet Take 1 tablet (40 mg total) by mouth daily. 09/29/19   Claretta Fraise, MD  Iron, Ferrous Sulfate, 325 (65 Fe) MG TABS Take 1 tablet by mouth every Monday, Wednesday, and Friday. 09/30/19   Claretta Fraise, MD  nitroGLYCERIN (NITROSTAT) 0.4 MG SL tablet Place 1 tablet (0.4 mg total) under the tongue every 5 (five) minutes as needed for chest pain. 07/20/15   Erlene Quan, PA-C  pantoprazole (PROTONIX) 40 MG tablet TAKE 1 TABLET ONCE DAILY FOR REFLUX 02/28/20   Claretta Fraise, MD  predniSONE (DELTASONE) 10 MG  tablet Take 5 daily for 3 days followed by 4,3,2 and 1 for 3 days each. 06/21/20   Claretta Fraise, MD  triamcinolone cream (KENALOG) 0.1 % Apply 1 application topically 2 (two) times daily. Patient taking differently: Apply 1 application topically daily as needed (itching). 04/30/17   Chipper Herb, MD    Review of Systems:  Constitutional:  No weight loss, night sweats  Head&Eyes: No headache.  No vision loss.  No eye pain or scotoma ENT:  No Difficulty swallowing,Tooth/dental problems,Sore throat,  No ear ache, post nasal drip,  Cardio-vascular:  No chest pain, Orthopnea, PND, swelling in lower extremities,  dizziness, palpitations  GI:  No  abdominal pain,diarrhea, loss of appetite, hematochezia, heartburn, indigestion, Resp:  No shortness of breath with exertion or at rest.  No coughing up of blood .No wheezing.No chest wall deformity  Skin:  no rash or lesions.  GU:  no dysuria, change in color of urine, no urgency or frequency. No flank pain.  Musculoskeletal:  No joint  pain or swelling. No decreased range of motion. No back pain.  Psych:  No change in mood or affect. No depression or anxiety. Neurologic: No headache, no dysesthesia, no focal weakness, no vision loss. No syncope  Physical Exam: Vitals:   06/25/20 1118 06/25/20 1130 06/25/20 1131 06/25/20 1200  BP:  (!) 111/48  (!) 108/52  Pulse:  81  79  Resp:  18  15  Temp:   99 F (37.2 C)   TempSrc:   Oral   SpO2:  100%  100%  Weight: 70 kg     Height: 6' (1.829 m)      General:  A&O x 3, NAD, nontoxic, pleasant/cooperative Head/Eye: No conjunctival hemorrhage, no icterus, [place]/AT, No nystagmus ENT:  No icterus,  No thrush, good dentition, no pharyngeal exudate Neck:  No masses, no lymphadenpathy, no bruits CV:  RRR, no rub, no gallop, no S3 Lung:  Bibasilar rales. No wheeze Abdomen: soft/NT, +BS, nondistended, no peritoneal signs Ext: No cyanosis, No rashes, No petechiae, No lymphangitis, trace LE edema Neuro: CNII-XII intact, strength 4/5 in bilateral upper and lower extremities, no dysmetria  Labs on Admission:  Basic Metabolic Panel: Recent Labs  Lab 06/25/20 1142  NA 138  K 4.0  CL 105  CO2 23  GLUCOSE 124*  BUN 68*  CREATININE 1.40*  CALCIUM 9.5   Liver Function Tests: Recent Labs  Lab 06/25/20 1142  AST 27  ALT 19  ALKPHOS 42  BILITOT 0.8  PROT 5.8*  ALBUMIN 3.7   Recent Labs  Lab 06/25/20 1142  LIPASE 24   No results for input(s): AMMONIA in the last 168 hours. CBC: Recent Labs  Lab 06/25/20 1142  WBC 15.2*  NEUTROABS 12.7*  HGB 6.1*  HCT 19.6*  MCV 104.8*  PLT 226   Coagulation Profile: No results for input(s): INR, PROTIME in the last 168 hours. Cardiac Enzymes: No results for input(s): CKTOTAL, CKMB, CKMBINDEX, TROPONINI in the last 168 hours. BNP: Invalid input(s): POCBNP CBG: No results for input(s): GLUCAP in the last 168 hours. Urine analysis:    Component Value Date/Time   COLORURINE YELLOW 01/20/2017 2005   APPEARANCEUR CLEAR  01/20/2017 2005   LABSPEC 1.018 01/20/2017 2005   PHURINE 5.0 01/20/2017 2005   GLUCOSEU NEGATIVE 01/20/2017 2005   HGBUR NEGATIVE 01/20/2017 2005   [place] NEGATIVE 01/20/2017 2005   BILIRUBINUR neg 07/07/2012 [place] 01/20/2017 2005   PROTEINUR NEGATIVE 01/20/2017 2005  UROBILINOGEN 0.2 11/08/2012 1927   NITRITE NEGATIVE 01/20/2017 2005   LEUKOCYTESUR NEGATIVE 01/20/2017 2005   Sepsis Labs: @LABRCNTIP (procalcitonin:4,lacticidven:4) ) Recent Results (from the past 240 hour(s))  Resp Panel by RT-PCR (Flu A&B, Covid) Nasopharyngeal Swab     Status: Abnormal   Collection Time: 06/25/20 11:42 AM   Specimen: Nasopharyngeal Swab; Nasopharyngeal(NP) swabs in vial transport medium  Result Value Ref Range Status   SARS Coronavirus 2 by RT PCR POSITIVE (A) NEGATIVE Final    Comment: RESULT CALLED TO, READ BACK BY AND VERIFIED WITH: LONG L.@ 1252 ON [id number] BY HENDERSON L (NOTE) SARS-CoV-2 target nucleic acids are DETECTED.  The SARS-CoV-2 RNA is generally detectable in upper respiratory specimens during the acute phase of infection. Positive results are indicative of the presence of the identified virus, but do not rule out bacterial infection or co-infection with other pathogens not detected by the test. Clinical correlation with patient history and other diagnostic information is necessary to determine patient infection status. The expected result is Negative.  Fact Sheet for Patients: EntrepreneurPulse.com.au  Fact Sheet for Healthcare Providers: IncredibleEmployment.be  This test is not yet approved or cleared by the [place] FDA and  has been authorized for detection and/or diagnosis of SARS-CoV-2 by FDA under an Emergency Use Authorization (EUA).  This EUA will remain in effect (meaning this test c an be used) for the duration of  the COVID-19 declaration under Section 564(b)(1) of the Act, 21 U.S.C. section  360bbb-3(b)(1), unless the authorization is terminated or revoked sooner.     Influenza A by PCR NEGATIVE NEGATIVE Final   Influenza B by PCR NEGATIVE NEGATIVE Final    Comment: (NOTE) The Xpert Xpress SARS-CoV-2/FLU/RSV plus assay is intended as an aid in the diagnosis of influenza from Nasopharyngeal swab specimens and should not be used as a sole basis for treatment. Nasal washings and aspirates are unacceptable for Xpert Xpress SARS-CoV-2/FLU/RSV testing.  Fact Sheet for Patients: EntrepreneurPulse.com.au  Fact Sheet for Healthcare Providers: IncredibleEmployment.be  This test is not yet approved or cleared by the [place] FDA and has been authorized for detection and/or diagnosis of SARS-CoV-2 by FDA under an Emergency Use Authorization (EUA). This EUA will remain in effect (meaning this test can be used) for the duration of the COVID-19 declaration under Section 564(b)(1) of the Act, 21 U.S.C. section 360bbb-3(b)(1), unless the authorization is terminated or revoked.  Performed at [facility], [address] [place], [place] [address]      Radiological Exams on Admission: No results found.  EKG: Independently reviewed. pending    Time spent:60 minutes Code Status:   FULL Family Communication:  No Family at bedside Disposition Plan: expect 2 day hospitalization Consults called: GI DVT Prophylaxis:   SCDs  Orson Eva, DO  [facility] Pager [phone]  If 7PM-7AM, please contact night-coverage [email or web address] Password [email or web address] 06/25/2020, 1:09 PM

## 2020-06-26 ENCOUNTER — Inpatient Hospital Stay (HOSPITAL_COMMUNITY): Payer: MEDICARE

## 2020-06-26 DIAGNOSIS — I2510 Atherosclerotic heart disease of native coronary artery without angina pectoris: Secondary | ICD-10-CM | POA: Diagnosis not present

## 2020-06-26 DIAGNOSIS — D500 Iron deficiency anemia secondary to blood loss (chronic): Secondary | ICD-10-CM

## 2020-06-26 DIAGNOSIS — K922 Gastrointestinal hemorrhage, unspecified: Secondary | ICD-10-CM

## 2020-06-26 DIAGNOSIS — I248 Other forms of acute ischemic heart disease: Secondary | ICD-10-CM | POA: Diagnosis not present

## 2020-06-26 LAB — CBC WITH DIFFERENTIAL/PLATELET
Abs Immature Granulocytes: 0.04 10*3/uL (ref 0.00–0.07)
Basophils Absolute: 0 10*3/uL (ref 0.0–0.1)
Basophils Relative: 0 %
Eosinophils Absolute: 0 10*3/uL (ref 0.0–0.5)
Eosinophils Relative: 0 %
HCT: 23.9 % — ABNORMAL LOW (ref 39.0–52.0)
Hemoglobin: 7.6 g/dL — ABNORMAL LOW (ref 13.0–17.0)
Immature Granulocytes: 0 %
Lymphocytes Relative: 22 %
Lymphs Abs: 2.2 10*3/uL (ref 0.7–4.0)
MCH: 31.5 pg (ref 26.0–34.0)
MCHC: 31.8 g/dL (ref 30.0–36.0)
MCV: 99.2 fL (ref 80.0–100.0)
Monocytes Absolute: 0.9 10*3/uL (ref 0.1–1.0)
Monocytes Relative: 9 %
Neutro Abs: 7.1 10*3/uL (ref 1.7–7.7)
Neutrophils Relative %: 69 %
Platelets: 175 10*3/uL (ref 150–400)
RBC: 2.41 MIL/uL — ABNORMAL LOW (ref 4.22–5.81)
RDW: 17.6 % — ABNORMAL HIGH (ref 11.5–15.5)
WBC: 10.3 10*3/uL (ref 4.0–10.5)
nRBC: 0 % (ref 0.0–0.2)

## 2020-06-26 LAB — COMPREHENSIVE METABOLIC PANEL
ALT: 17 U/L (ref 0–44)
AST: 35 U/L (ref 15–41)
Albumin: 3.3 g/dL — ABNORMAL LOW (ref 3.5–5.0)
Alkaline Phosphatase: 33 U/L — ABNORMAL LOW (ref 38–126)
Anion gap: 5 (ref 5–15)
BUN: 50 mg/dL — ABNORMAL HIGH (ref 8–23)
CO2: 25 mmol/L (ref 22–32)
Calcium: 8.3 mg/dL — ABNORMAL LOW (ref 8.9–10.3)
Chloride: 107 mmol/L (ref 98–111)
Creatinine, Ser: 1.27 mg/dL — ABNORMAL HIGH (ref 0.61–1.24)
GFR, Estimated: 54 mL/min — ABNORMAL LOW (ref >60)
Glucose, Bld: 99 mg/dL (ref 70–99)
Potassium: 3.6 mmol/L (ref 3.5–5.1)
Sodium: 137 mmol/L (ref 135–145)
Total Bilirubin: 1.3 mg/dL — ABNORMAL HIGH (ref 0.3–1.2)
Total Protein: 5.3 g/dL — ABNORMAL LOW (ref 6.5–8.1)

## 2020-06-26 LAB — ECHOCARDIOGRAM COMPLETE
AR max vel: 1.13 cm2
AV Area VTI: 1.2 cm2
AV Area mean vel: 1.13 cm2
AV Mean grad: 20.5 mmHg
AV Peak grad: 36.8 mmHg
Ao pk vel: 3.03 m/s
Area-P 1/2: 4.04 cm2
Height: 72 in
S' Lateral: 2.65 cm
Weight: 2451.52 oz

## 2020-06-26 LAB — PREPARE RBC (CROSSMATCH)

## 2020-06-26 LAB — D-DIMER, QUANTITATIVE: D-Dimer, Quant: 1.34 ug/mL-FEU — ABNORMAL HIGH (ref 0.00–0.50)

## 2020-06-26 LAB — C-REACTIVE PROTEIN: CRP: 0.5 mg/dL (ref <1.0)

## 2020-06-26 LAB — FERRITIN: Ferritin: 16 ng/mL — ABNORMAL LOW (ref 24–336)

## 2020-06-26 MED ORDER — CHLORHEXIDINE GLUCONATE CLOTH 2 % EX PADS
6.0000 | MEDICATED_PAD | Freq: Every day | CUTANEOUS | Status: DC
  Administered 2020-06-26 – 2020-06-27 (×2): 6 via TOPICAL

## 2020-06-26 MED ORDER — SODIUM CHLORIDE 0.9% IV SOLUTION: Freq: Once | INTRAVENOUS | Status: AC

## 2020-06-26 NOTE — Progress Notes (Signed)
PROGRESS NOTE  Jon Reid QMG:[id number] DOB: Jan 18, 1929 DOA: 06/25/2020 PCP: Claretta Fraise, MD  Brief History:  87 y.o. male with medical history of knee pain, prostate adenocarcinoma, coronary artery disease, CKD stage III, hypertension, stroke, aortic stenosis, right bundle branch block presenting with nausea, vomiting, and generalized weakness.  The patient states that he has had nausea and vomiting for the better part of 2 weeks.  His generalized weakness has gradually worsened during this time to the point where he was having difficulty getting out of bed on the morning of 06/25/2020.  As result, the patient was brought to the emergency department for further evaluation.  He states that his emesis is very dark looking almost coffee-ground like.  He denies any bright red blood with emesis.  He denies any diarrhea, hematochezia, but admits to having dark/black stools.  He has had some subjective fevers and chills.  He states that he has been taking Celebrex for at least 1 month.  His dose of Celebrex was recently increased on 06/21/2020.  He denies any other over-the-counter NSAIDs.  He has had some exertional chest discomfort and shortness of breath over the past week.  He denies any coughing or hemoptysis.  There is no dysuria or hematuria.  He states that he has been vaccinated x3 for COVID-19.  In the emergency department, the patient had a low-grade temperature 99.0 F.  He was hemodynamically stable with oxygen saturation of 100% on room air.  BMP was essentially unremarkable with serum creatinine of 1.40 which is near his baseline.  WBC 15.2, hemoglobin 6.1, platelets 226,000.  2 units PRBC were ordered.  GI was consulted to assist with management.  FOBT with brown stool, heme +  Assessment/Plan: Upper GI bleed/symptomatic anemia -The patient endorses Celebrex use -GI consulted -Plans noted for possible EGD -continue Protonix drip -2 units PRBC have been given -order one  additional unit  COVID-19 Infection -chest x-ray--personally reviewed--scattered interstitial opacities -Start remdesivir--D#2 of 3 -Stable on room air -CRP 0.6>>0.5>> -Ferritin 13>>16>> -D-Dimer 1.34>> -PCT <0.10 -Vitamin C and zinc  Coronary artery disease/chest pain/Elevated Troponin -troponins 1556>>1572 -Obtain EKG--sinus, RBBB -no chest pain since time of arrival -discussed with cardiology (Nishan)-->not a candidate for [facility] or aggressive intervention presently-->need to definitive treat GIB first -06/26/20--Echo EF 50-55%, distal septal HK, moderate AS, mild MR, mild AI  CKD stage IIIb -Baseline creatinine 1.4-1.6 -Monitor BMP -Holding furosemide  Leukocytosis -Check PCT <0.10 -Check chest x-ray--no consolidation -UA--no pyuria -likely stress demargination -remains afebrile and hemodynamically stable -Blood cultures x2 sets--neg to date  Aortic stenosis -Outpatient cardiology follow-up  History of stroke -Holding aspirin in the setting of GI bleed       Status is: Inpatient  Remains inpatient appropriate because:IV treatments appropriate due to intensity of illness or inability to take PO   Dispo: The patient is from: Home              Anticipated d/c is to: Home              Patient currently is not medically stable to d/c.   Difficult to place patient No        Family Communication:  no Family at bedside  Consultants:  GI, cardiology on phone  Code Status:  FULL   DVT Prophylaxis:  SCDs   Procedures: As Listed in Progress Note Above  Antibiotics: None  RN Pressure Injury Documentation:  Subjective: Patient denies fevers, chills, headache, chest pain, dyspnea, nausea, vomiting, diarrhea, abdominal pain, dysuria, hematuria, hematochezia, and melena. States he feels stronger today  Objective: Vitals:   06/26/20 0600 06/26/20 0700 06/26/20 0758 06/26/20 0800  BP: (!) 106/45 (!) 107/56  (!) 123/41  Pulse: (!) 54  (!) 47 (!) 51 (!) 45  Resp: 17 10 11 13   Temp:   98.3 F (36.8 C)   TempSrc:      SpO2: 98% 93% 96% 92%  Weight:      Height:        Intake/Output Summary (Last 24 hours) at 06/26/2020 1045 Last data filed at 06/26/2020 0803 Gross per 24 hour  Intake 2907.52 ml  Output 400 ml  Net 2507.52 ml   Weight change:  Exam:   General:  Pt is alert, follows commands appropriately, not in acute distress  HEENT: No icterus, No thrush, No neck mass, [place]/AT  Cardiovascular: RRR, S1/S2, no rubs, no gallops  Respiratory: bibasilar rales. No wheeze  Abdomen: Soft/+BS, non tender, non distended, no guarding  Extremities: No edema, No lymphangitis, No petechiae, No rashes, no synovitis   Data Reviewed: I have personally reviewed following labs and imaging studies Basic Metabolic Panel: Recent Labs  Lab 06/25/20 1142 06/26/20 0600  NA 138 137  K 4.0 3.6  CL 105 107  CO2 23 25  GLUCOSE 124* 99  BUN 68* 50*  CREATININE 1.40* 1.27*  CALCIUM 9.5 8.3*   Liver Function Tests: Recent Labs  Lab 06/25/20 1142 06/26/20 0600  AST 27  27 35  ALT 18  19 17   ALKPHOS 43  42 33*  BILITOT 0.9  0.8 1.3*  PROT 5.8*  5.8* 5.3*  ALBUMIN 3.7  3.7 3.3*   Recent Labs  Lab 06/25/20 1142  LIPASE 24   No results for input(s): AMMONIA in the last 168 hours. Coagulation Profile: No results for input(s): INR, PROTIME in the last 168 hours. CBC: Recent Labs  Lab 06/25/20 1142 06/26/20 0600  WBC 15.2* 10.3  NEUTROABS 12.7* 7.1  HGB 6.1* 7.6*  HCT 19.6* 23.9*  MCV 104.8* 99.2  PLT 226 175   Cardiac Enzymes: No results for input(s): CKTOTAL, CKMB, CKMBINDEX, TROPONINI in the last 168 hours. BNP: Invalid input(s): POCBNP CBG: No results for input(s): GLUCAP in the last 168 hours. HbA1C: No results for input(s): HGBA1C in the last 72 hours. Urine analysis:    Component Value Date/Time   COLORURINE YELLOW 06/25/2020 1250   APPEARANCEUR CLEAR 06/25/2020 1250   LABSPEC 1.018  06/25/2020 1250   PHURINE 5.0 06/25/2020 1250   GLUCOSEU NEGATIVE 06/25/2020 1250   HGBUR NEGATIVE 06/25/2020 1250   BILIRUBINUR NEGATIVE 06/25/2020 1250   BILIRUBINUR neg 07/07/2012 1455   KETONESUR 5 (A) 06/25/2020 1250   PROTEINUR NEGATIVE 06/25/2020 1250   UROBILINOGEN 0.2 11/08/2012 1927   NITRITE NEGATIVE 06/25/2020 1250   LEUKOCYTESUR NEGATIVE 06/25/2020 1250   Sepsis Labs: @LABRCNTIP (procalcitonin:4,lacticidven:4) ) Recent Results (from the past 240 hour(s))  Resp Panel by RT-PCR (Flu A&B, Covid) Nasopharyngeal Swab     Status: Abnormal   Collection Time: 06/25/20 11:42 AM   Specimen: Nasopharyngeal Swab; Nasopharyngeal(NP) swabs in vial transport medium  Result Value Ref Range Status   SARS Coronavirus 2 by RT PCR POSITIVE (A) NEGATIVE Final    Comment: RESULT CALLED TO, READ BACK BY AND VERIFIED WITH: LONG L.@ 1252 ON [id number] BY HENDERSON L (NOTE) SARS-CoV-2 target nucleic acids are DETECTED.  The SARS-CoV-2 RNA is generally detectable in  upper respiratory specimens during the acute phase of infection. Positive results are indicative of the presence of the identified virus, but do not rule out bacterial infection or co-infection with other pathogens not detected by the test. Clinical correlation with patient history and other diagnostic information is necessary to determine patient infection status. The expected result is Negative.  Fact Sheet for Patients: EntrepreneurPulse.com.au  Fact Sheet for Healthcare Providers: IncredibleEmployment.be  This test is not yet approved or cleared by the [place] FDA and  has been authorized for detection and/or diagnosis of SARS-CoV-2 by FDA under an Emergency Use Authorization (EUA).  This EUA will remain in effect (meaning this test c an be used) for the duration of  the COVID-19 declaration under Section 564(b)(1) of the Act, 21 U.S.C. section 360bbb-3(b)(1), unless the authorization  is terminated or revoked sooner.     Influenza A by PCR NEGATIVE NEGATIVE Final   Influenza B by PCR NEGATIVE NEGATIVE Final    Comment: (NOTE) The Xpert Xpress SARS-CoV-2/FLU/RSV plus assay is intended as an aid in the diagnosis of influenza from Nasopharyngeal swab specimens and should not be used as a sole basis for treatment. Nasal washings and aspirates are unacceptable for Xpert Xpress SARS-CoV-2/FLU/RSV testing.  Fact Sheet for Patients: EntrepreneurPulse.com.au  Fact Sheet for Healthcare Providers: IncredibleEmployment.be  This test is not yet approved or cleared by the [place] FDA and has been authorized for detection and/or diagnosis of SARS-CoV-2 by FDA under an Emergency Use Authorization (EUA). This EUA will remain in effect (meaning this test can be used) for the duration of the COVID-19 declaration under Section 564(b)(1) of the Act, 21 U.S.C. section 360bbb-3(b)(1), unless the authorization is terminated or revoked.  Performed at [facility], [address], [place], [place] [address]   Culture, blood (Routine X 2) w Reflex to ID Panel     Status: None (Preliminary result)   Collection Time: 06/25/20  1:45 PM   Specimen: Blood  Result Value Ref Range Status   Specimen Description   Final    BLOOD LEFT HAND BOTTLES DRAWN AEROBIC AND ANAEROBIC   Special Requests   Final    Blood Culture adequate volume Performed at [facility], [address], [place], [place] [address]    Culture PENDING  Incomplete   Report Status PENDING  Incomplete  Culture, blood (Routine X 2) w Reflex to ID Panel     Status: None (Preliminary result)   Collection Time: 06/25/20  1:45 PM   Specimen: Blood  Result Value Ref Range Status   Specimen Description BLOOD LEFT ARM BOTTLES DRAWN AEROBIC AND ANAEROBIC  Final   Special Requests   Final    Blood Culture adequate volume Performed at [facility], [address], [place],  [address]     Culture PENDING  Incomplete   Report Status PENDING  Incomplete  MRSA PCR Screening     Status: None   Collection Time: 06/25/20  7:44 PM   Specimen: Nasal Mucosa; Nasopharyngeal  Result Value Ref Range Status   MRSA by PCR NEGATIVE NEGATIVE Final    Comment:        The GeneXpert MRSA Assay (FDA approved for NASAL specimens only), is one component of a comprehensive MRSA colonization surveillance program. It is not intended to diagnose MRSA infection nor to guide or monitor treatment for MRSA infections. Performed at [facility], [address] [place],  [address]      Scheduled Meds: . vitamin C  500  mg Oral Daily  . Chlorhexidine Gluconate Cloth  6 each Topical Daily  . fluticasone  2 spray Each Nare Daily  . [START ON 06/29/2020] pantoprazole  40 mg Intravenous Q12H  . zinc sulfate  220 mg Oral Daily   Continuous Infusions: . sodium chloride 50 mL/hr at 06/25/20 1740  . pantoprozole (PROTONIX) infusion 8 mg/hr (06/26/20 0803)  . remdesivir 100 mg in NS 100 mL 100 mg (06/26/20 1042)    Procedures/Studies: DG CHEST PORT 1 VIEW  Result Date: 06/25/2020 CLINICAL DATA:  COVID 19 positive. EXAM: PORTABLE CHEST 1 VIEW COMPARISON:  CT chest dated January 18, 2020. Chest x-ray dated August 29, 2017. FINDINGS: Unchanged mild cardiomegaly status post CABG. Normal pulmonary vascularity. Subtle reticulonodular opacities at the lung bases. No focal consolidation, pleural effusion, or pneumothorax. No acute osseous abnormality. Unchanged large hiatal hernia. IMPRESSION: 1. Subtle reticulonodular opacities at the lung bases, nonspecific, but could reflect atypical infection/viral pneumonia. Electronically Signed   By: Titus Dubin M.D.   On: 06/25/2020 14:06   ECHOCARDIOGRAM COMPLETE  Result Date: 06/26/2020    ECHOCARDIOGRAM REPORT   Patient Name:   Jon Reid Date of Exam: 06/26/2020 Medical Rec #:  [id number]      Height:       72.0 in Accession #:    [id number]     Weight:        153.2 lb Date of Birth:  April 30, 1928      BSA:          1.902 m Patient Age:    50 years       BP:           106/45 mmHg Patient Gender: M              HR:           52 bpm. Exam Location:  [facility] Procedure: 2D Echo, Cardiac Doppler and Color Doppler Indications:    NSTEMI  History:        Patient has prior history of Echocardiogram examinations, most                 recent 02/09/2019. CAD, Prior CABG, Stroke, Aortic Valve Disease                 and Moderate aortic stenosis, Arrythmias:RBBB;                 Signs/Symptoms:Shortness of Breath and Chest Pain. COVID+, CKD.  Sonographer:    Dustin Flock RDCS Referring Phys: [phone] Clytie Shetley IMPRESSIONS  1. Distal septal hyokinesis. Left ventricular ejection fraction, by estimation, is 50 to 55%. The left ventricle has low normal function. The left ventricle demonstrates regional wall motion abnormalities (see scoring diagram/findings for description). There is mild left ventricular hypertrophy. Left ventricular diastolic parameters are indeterminate.  2. Right ventricular systolic function is normal. The right ventricular size is normal. There is mildly elevated pulmonary artery systolic pressure.  3. Left atrial size was moderately dilated.  4. The mitral valve is abnormal. Mild mitral valve regurgitation. No evidence of mitral stenosis. Moderate mitral annular calcification.  5. The aortic valve is tricuspid. There is moderate calcification of the aortic valve. There is moderate thickening of the aortic valve. Aortic valve regurgitation is mild. Moderate aortic valve stenosis.  6. The inferior vena cava is normal in size with greater than 50% respiratory variability, suggesting right atrial pressure of 3 mmHg. FINDINGS  Left Ventricle: Distal septal hyokinesis. Left ventricular ejection fraction,  by estimation, is 50 to 55%. The left ventricle has low normal function. The left ventricle demonstrates regional wall motion abnormalities. The left ventricular  internal cavity size was normal in size. There is mild left ventricular hypertrophy. Left ventricular diastolic parameters are indeterminate. Right Ventricle: The right ventricular size is normal. No increase in right ventricular wall thickness. Right ventricular systolic function is normal. There is mildly elevated pulmonary artery systolic pressure. The tricuspid regurgitant velocity is 3.00  m/s, and with an assumed right atrial pressure of 3 mmHg, the estimated right ventricular systolic pressure is 33.2 mmHg. Left Atrium: Left atrial size was moderately dilated. Right Atrium: Right atrial size was normal in size. Pericardium: There is no evidence of pericardial effusion. Mitral Valve: The mitral valve is abnormal. There is mild thickening of the mitral valve leaflet(s). There is mild calcification of the mitral valve leaflet(s). Moderate mitral annular calcification. Mild mitral valve regurgitation. No evidence of mitral  valve stenosis. Tricuspid Valve: The tricuspid valve is normal in structure. Tricuspid valve regurgitation is mild . No evidence of tricuspid stenosis. Aortic Valve: The aortic valve is tricuspid. There is moderate calcification of the aortic valve. There is moderate thickening of the aortic valve. There is moderate aortic valve annular calcification. Aortic valve regurgitation is mild. Moderate aortic stenosis is present. Aortic valve mean gradient measures 20.5 mmHg. Aortic valve peak gradient measures 36.8 mmHg. Aortic valve area, by VTI measures 1.20 cm. Pulmonic Valve: The pulmonic valve was normal in structure. Pulmonic valve regurgitation is not visualized. No evidence of pulmonic stenosis. Aorta: The aortic root is normal in size and structure. Venous: The inferior vena cava is normal in size with greater than 50% respiratory variability, suggesting right atrial pressure of 3 mmHg. IAS/Shunts: The interatrial septum was not well visualized.  LEFT VENTRICLE PLAX 2D LVIDd:         4.14  cm  Diastology LVIDs:         2.65 cm  LV e' medial:    4.90 cm/s LV PW:         1.15 cm  LV E/e' medial:  21.0 LV IVS:        1.19 cm  LV e' lateral:   8.49 cm/s LVOT diam:     2.20 cm  LV E/e' lateral: 12.1 LV SV:         93 LV SV Index:   49 LVOT Area:     3.80 cm  RIGHT VENTRICLE RV Basal diam:  3.73 cm RV S prime:     8.27 cm/s TAPSE (M-mode): 2.4 cm LEFT ATRIUM              Index       RIGHT ATRIUM           Index LA diam:        4.70 cm  2.47 cm/m  RA Area:     21.60 cm LA Vol (A2C):   71.6 ml  37.64 ml/m RA Volume:   64.90 ml  34.11 ml/m LA Vol (A4C):   110.0 ml 57.82 ml/m LA Biplane Vol: 95.9 ml  50.41 ml/m  AORTIC VALVE AV Area (Vmax):    1.13 cm AV Area (Vmean):   1.13 cm AV Area (VTI):     1.20 cm AV Vmax:           303.33 cm/s AV Vmean:          214.500 cm/s AV VTI:  0.772 m AV Peak Grad:      36.8 mmHg AV Mean Grad:      20.5 mmHg LVOT Vmax:         90.10 cm/s LVOT Vmean:        63.900 cm/s LVOT VTI:          0.244 m LVOT/AV VTI ratio: 0.32  AORTA Ao Root diam: 3.10 cm MITRAL VALVE                TRICUSPID VALVE MV Area (PHT): 4.04 cm     TR Peak grad:   36.0 mmHg MV Decel Time: 188 msec     TR Vmax:        300.00 cm/s MV E velocity: 103.00 cm/s MV A velocity: 60.80 cm/s   SHUNTS MV E/A ratio:  1.69         Systemic VTI:  0.24 m                             Systemic Diam: 2.20 cm Jenkins Rouge MD Electronically signed by Jenkins Rouge MD Signature Date/Time: 06/26/2020/10:39:59 AM    Final     Orson Eva, DO  [facility]  If 7PM-7AM, please contact night-coverage [email or web address] Password [email or web address] 06/26/2020, 10:45 AM   LOS: 1 day

## 2020-06-26 NOTE — Progress Notes (Signed)
Subjective:  Patient has no complaints.  He denies chest pain shortness of breath nausea or vomiting.  He also denies abdominal pain.  Current Medications:  Current Facility-Administered Medications:  .  0.9 %  sodium chloride infusion, , Intravenous, Continuous, Tat, David, MD, Last Rate: 50 mL/hr at 06/25/20 1740, New Bag at 06/25/20 1740 .  acetaminophen (TYLENOL) tablet 650 mg, 650 mg, Oral, Q6H PRN, Tat, David, MD .  ascorbic acid (VITAMIN C) tablet 500 mg, 500 mg, Oral, Daily, Tat, David, MD, 500 mg at 06/26/20 1040 .  Chlorhexidine Gluconate Cloth 2 % PADS 6 each, 6 each, Topical, Daily, Tat, David, MD, 6 each at 06/26/20 0848 .  fluticasone (FLONASE) 50 MCG/ACT nasal spray 2 spray, 2 spray, Each Nare, Daily, Tat, David, MD, 2 spray at 06/26/20 1040 .  ondansetron (ZOFRAN) tablet 4 mg, 4 mg, Oral, Q6H PRN **OR** ondansetron (ZOFRAN) injection 4 mg, 4 mg, Intravenous, Q6H PRN, Tat, David, MD, 4 mg at 06/26/20 1310 .  pantoprazole (PROTONIX) 80 mg in sodium chloride 0.9 % 100 mL (0.8 mg/mL) infusion, 8 mg/hr, Intravenous, Continuous, Tat, David, MD, Last Rate: 10 mL/hr at 06/26/20 0803, 8 mg/hr at 06/26/20 0803 .  [START ON 06/29/2020] pantoprazole (PROTONIX) injection 40 mg, 40 mg, Intravenous, Q12H, Tat, David, MD .  remdesivir 100 mg in sodium chloride 0.9 % 100 mL IVPB, 100 mg, Intravenous, Daily, Tat, David, MD, Last Rate: 200 mL/hr at 06/26/20 1042, 100 mg at 06/26/20 1042 .  zinc sulfate capsule 220 mg, 220 mg, Oral, Daily, Tat, David, MD, 220 mg at 06/26/20 1040    Objective: Blood pressure 103/62, pulse (!) 56, temperature 97.9 F (36.6 C), temperature source Oral, resp. rate 19, height 6' (1.829 m), weight 69.5 kg, SpO2 100 %. Patient is alert and in no acute distress. He has generalized wasting. No neck masses or thyromegaly noted. Cardiac exam with regular rhythm normal S1 and S2. No murmur or gallop noted. Lungs are clear to auscultation. Abdomen is flat.  Bowel sounds  are normal.  On palpation soft and nontender with organomegaly or masses. No LE edema or clubbing noted.  Labs/studies Results:  CBC Latest Ref Rng & Units 06/26/2020 06/25/2020 02/22/2020  WBC 4.0 - 10.5 K/uL 10.3 15.2(H) 7.6  Hemoglobin 13.0 - 17.0 g/dL 7.6(L) 6.1(LL) 12.9(L)  Hematocrit 39.0 - 52.0 % 23.9(L) 19.6(L) 39.7  Platelets 150 - 400 K/uL 175 226 255    CMP Latest Ref Rng & Units 06/26/2020 06/25/2020 06/25/2020  Glucose 70 - 99 mg/dL 99 - 124(H)  BUN 8 - 23 mg/dL 50(H) - 68(H)  Creatinine 0.61 - 1.24 mg/dL 1.27(H) - 1.40(H)  Sodium 135 - 145 mmol/L 137 - 138  Potassium 3.5 - 5.1 mmol/L 3.6 - 4.0  Chloride 98 - 111 mmol/L 107 - 105  CO2 22 - 32 mmol/L 25 - 23  Calcium 8.9 - 10.3 mg/dL 8.3(L) - 9.5  Total Protein 6.5 - 8.1 g/dL 5.3(L) 5.8(L) 5.8(L)  Total Bilirubin 0.3 - 1.2 mg/dL 1.3(H) 0.9 0.8  Alkaline Phos 38 - 126 U/L 33(L) 43 42  AST 15 - 41 U/L 35 27 27  ALT 0 - 44 U/L _0 Hepatic Function Latest Ref Rng & Units 06/26/2020 06/25/2020 06/25/2020  Total Protein 6.5 - 8.1 g/dL 5.3(L) 5.8(L) 5.8(L)  Albumin 3.5 - 5.0 g/dL 3.3(L) 3.7 3.7  AST 15 - 41 U/L 35 27 27  ALT 0 - 44 U/L _1 Alk Phosphatase 38 -  126 U/L 33(L) 43 42  Total Bilirubin 0.3 - 1.2 mg/dL 1.3(H) 0.9 0.8  Bilirubin, Direct 0.0 - 0.2 mg/dL - 0.1 -    Lab Results  Component Value Date   CRP 0.5 06/26/2020    SARS-COV-2 positive  Portable chest film on admission reveals subtle reticulonodular opacities at both lung bases.  Etiology felt to be nonspecific or atypical infection.   Serum iron 11, TIBC 309 and saturation 4%. Serum ferritin 13. Folate level 15 B12 189  Lab data from 08/06/2019 Serum iron 42 TIBC 302 and ferritin was 54.  CRP points 06 yesterday 1.05 today D-dimer was 0.67 yesterday and 1.34 today.   Assessment:  #1.  GI bleed.  Patient presented with melena.  His hemoglobin on admission was 6.1 and increased to 7.6 g after 2 units.  He received third unit today.  Patient is  hemodynamically stable.  History of NSAID use.  Therefore peptic ulcer disease likely but he could also have GI angiodysplasia given history of aortic stenosis.  Given history of anorexia and weight loss one has to worry about gastric neoplasm.  #2.  Anemia.  Iron studies suggest iron deficiency anemia.  B12 level is low normal.  May consider B12 replacement but will discuss with Dr. Carles Collet.  Records of prior endoscopic evaluation.  #3.  Patient is positive for Covid.  He does have reticular-nodular opacities at both lung bases.  He is on remdesivir.  He is not having any respiratory symptoms.  CRP is normal but D-dimer is mildly elevated.  He is also on p.o. zinc and vitamin C.  #4.  Aortic stenosis.  He has moderate aortic valve stenosis.  He had echo earlier today.  #5.  Elevated troponin levels.  Elevated levels may be due to anemia.  Patient is chest pain-free.   Recommendations  Full liquids today. CBC in a.m. Esophagogastroduodenoscopy on 06/27/2020.

## 2020-06-26 NOTE — Progress Notes (Signed)
  Echocardiogram 2D Echocardiogram has been performed.  Jon Reid 06/26/2020, 9:41 AM

## 2020-06-27 ENCOUNTER — Encounter (HOSPITAL_COMMUNITY): Payer: Self-pay | Admitting: Internal Medicine

## 2020-06-27 ENCOUNTER — Encounter (HOSPITAL_COMMUNITY)
Admission: EM | Disposition: A | Discharge status: Home / Self Care | Payer: Self-pay | Source: Home / Self Care | Attending: Internal Medicine

## 2020-06-27 ENCOUNTER — Inpatient Hospital Stay (HOSPITAL_COMMUNITY): Payer: MEDICARE | Admitting: Certified Registered Nurse Anesthetist

## 2020-06-27 ENCOUNTER — Telehealth: Payer: Self-pay | Admitting: Gastroenterology

## 2020-06-27 DIAGNOSIS — Z951 Presence of aortocoronary bypass graft: Secondary | ICD-10-CM

## 2020-06-27 DIAGNOSIS — I350 Nonrheumatic aortic (valve) stenosis: Secondary | ICD-10-CM

## 2020-06-27 DIAGNOSIS — I2510 Atherosclerotic heart disease of native coronary artery without angina pectoris: Secondary | ICD-10-CM | POA: Diagnosis not present

## 2020-06-27 DIAGNOSIS — K208 Other esophagitis without bleeding: Secondary | ICD-10-CM

## 2020-06-27 DIAGNOSIS — R778 Other specified abnormalities of plasma proteins: Secondary | ICD-10-CM

## 2020-06-27 DIAGNOSIS — K2211 Ulcer of esophagus with bleeding: Principal | ICD-10-CM

## 2020-06-27 DIAGNOSIS — K449 Diaphragmatic hernia without obstruction or gangrene: Secondary | ICD-10-CM | POA: Diagnosis not present

## 2020-06-27 HISTORY — PX: ESOPHAGOGASTRODUODENOSCOPY (EGD) WITH PROPOFOL: SHX5813

## 2020-06-27 LAB — TYPE AND SCREEN
ABO/RH(D): O POS
Antibody Screen: NEGATIVE
Unit division: 0
Unit division: 0
Unit division: 0

## 2020-06-27 LAB — CBC WITH DIFFERENTIAL/PLATELET
Abs Immature Granulocytes: 0.03 10*3/uL (ref 0.00–0.07)
Basophils Absolute: 0 10*3/uL (ref 0.0–0.1)
Basophils Relative: 0 %
Eosinophils Absolute: 0.1 10*3/uL (ref 0.0–0.5)
Eosinophils Relative: 1 %
HCT: 24.3 % — ABNORMAL LOW (ref 39.0–52.0)
Hemoglobin: 8 g/dL — ABNORMAL LOW (ref 13.0–17.0)
Immature Granulocytes: 0 %
Lymphocytes Relative: 21 %
Lymphs Abs: 1.7 10*3/uL (ref 0.7–4.0)
MCH: 31.5 pg (ref 26.0–34.0)
MCHC: 32.9 g/dL (ref 30.0–36.0)
MCV: 95.7 fL (ref 80.0–100.0)
Monocytes Absolute: 0.8 10*3/uL (ref 0.1–1.0)
Monocytes Relative: 10 %
Neutro Abs: 5.3 10*3/uL (ref 1.7–7.7)
Neutrophils Relative %: 68 %
Platelets: 155 10*3/uL (ref 150–400)
RBC: 2.54 MIL/uL — ABNORMAL LOW (ref 4.22–5.81)
RDW: 16.8 % — ABNORMAL HIGH (ref 11.5–15.5)
WBC: 7.8 10*3/uL (ref 4.0–10.5)
nRBC: 0 % (ref 0.0–0.2)

## 2020-06-27 LAB — BPAM RBC
Blood Product Expiration Date: 202204122359
Blood Product Expiration Date: 202205102359
Blood Product Expiration Date: 202205102359
ISSUE DATE / TIME: 202204091505
ISSUE DATE / TIME: 202204091842
ISSUE DATE / TIME: 202204101147
Unit Type and Rh: 5100
Unit Type and Rh: 5100
Unit Type and Rh: 9500

## 2020-06-27 LAB — COMPREHENSIVE METABOLIC PANEL
ALT: 17 U/L (ref 0–44)
AST: 30 U/L (ref 15–41)
Albumin: 2.8 g/dL — ABNORMAL LOW (ref 3.5–5.0)
Alkaline Phosphatase: 37 U/L — ABNORMAL LOW (ref 38–126)
Anion gap: 8 (ref 5–15)
BUN: 40 mg/dL — ABNORMAL HIGH (ref 8–23)
CO2: 21 mmol/L — ABNORMAL LOW (ref 22–32)
Calcium: 8.1 mg/dL — ABNORMAL LOW (ref 8.9–10.3)
Chloride: 111 mmol/L (ref 98–111)
Creatinine, Ser: 1.19 mg/dL (ref 0.61–1.24)
GFR, Estimated: 58 mL/min — ABNORMAL LOW (ref >60)
Glucose, Bld: 99 mg/dL (ref 70–99)
Potassium: 3.3 mmol/L — ABNORMAL LOW (ref 3.5–5.1)
Sodium: 140 mmol/L (ref 135–145)
Total Bilirubin: 1 mg/dL (ref 0.3–1.2)
Total Protein: 4.6 g/dL — ABNORMAL LOW (ref 6.5–8.1)

## 2020-06-27 LAB — C-REACTIVE PROTEIN: CRP: 0.6 mg/dL (ref <1.0)

## 2020-06-27 LAB — D-DIMER, QUANTITATIVE: D-Dimer, Quant: 2.2 ug/mL-FEU — ABNORMAL HIGH (ref 0.00–0.50)

## 2020-06-27 LAB — FERRITIN: Ferritin: 17 ng/mL — ABNORMAL LOW (ref 24–336)

## 2020-06-27 LAB — MAGNESIUM: Magnesium: 1.9 mg/dL (ref 1.7–2.4)

## 2020-06-27 SURGERY — ESOPHAGOGASTRODUODENOSCOPY (EGD) WITH PROPOFOL
Anesthesia: General

## 2020-06-27 MED ORDER — STERILE WATER FOR IRRIGATION IR SOLN
Status: DC | PRN
  Administered 2020-06-27: 100 mL

## 2020-06-27 MED ORDER — SUCRALFATE 1 GM/10ML PO SUSP
1.0000 g | Freq: Three times a day (TID) | ORAL | Status: DC
  Administered 2020-06-27 – 2020-06-29 (×8): 1 g via ORAL
  Filled 2020-06-27 (×8): qty 10

## 2020-06-27 MED ORDER — SODIUM CHLORIDE 0.9 % IV BOLUS
250.0000 mL | Freq: Once | INTRAVENOUS | Status: AC
  Administered 2020-06-27: 250 mL via INTRAVENOUS

## 2020-06-27 MED ORDER — PROPOFOL 10 MG/ML IV BOLUS
INTRAVENOUS | Status: DC | PRN
  Administered 2020-06-27: 30 mg via INTRAVENOUS
  Administered 2020-06-27: 80 mg via INTRAVENOUS

## 2020-06-27 MED ORDER — POTASSIUM CHLORIDE CRYS ER 20 MEQ PO TBCR: 40.0000 meq | EXTENDED_RELEASE_TABLET | Freq: Once | ORAL | Status: DC

## 2020-06-27 MED ORDER — PHENYLEPHRINE 40 MCG/ML (10ML) SYRINGE FOR IV PUSH (FOR BLOOD PRESSURE SUPPORT)
PREFILLED_SYRINGE | INTRAVENOUS | Status: DC | PRN
  Administered 2020-06-27 (×2): 80 ug via INTRAVENOUS

## 2020-06-27 MED ORDER — LIDOCAINE HCL (CARDIAC) PF 100 MG/5ML IV SOSY
PREFILLED_SYRINGE | INTRAVENOUS | Status: DC | PRN
  Administered 2020-06-27: 50 mg via INTRAVENOUS

## 2020-06-27 MED ORDER — POTASSIUM CHLORIDE 10 MEQ/100ML IV SOLN
10.0000 meq | INTRAVENOUS | Status: AC
  Administered 2020-06-27 (×3): 10 meq via INTRAVENOUS
  Filled 2020-06-27: qty 100

## 2020-06-27 MED ORDER — SODIUM CHLORIDE 0.9 % IV SOLN: INTRAVENOUS | Status: DC | PRN

## 2020-06-27 NOTE — Consult Note (Signed)
Cardiology Consult    Patient ID: Jon Reid MRN: [id number], DOB/AGE: [birth year]   Admit date: 06/25/2020 Date of Consult: 06/27/2020  Primary Physician: Claretta Fraise, MD Primary Cardiologist: Minus Breeding, MD Requesting Provider: D. Tat, MD  Patient Profile    Jon Reid is a 87 y.o. male with a history of CAD s/p CABG x 4 in2014, HTN, HL, moderate AS, stroke, CKD III, prostate cancer, and bifascicular block, who is being seen today for the evaluation of demand ischemia in the setting of GIB and COVID-19 infection at the request of Dr. Carles Collet.  Past Medical History   Past Medical History:  Diagnosis Date  . Arthritis   . Carcinoma of prostate [facility])    prostate  . CKD (chronic kidney disease), stage III ([place])   . Coronary artery disease    a. 10/2012 Cath: LM 20-30d, lAD 80p, 14m, 80-90d, LCX 90-53m, OM1 90, RCA 50p, 74m, 50d, RPDA 90, EF nl; b. 10/2012 CABG x 4 (LIMA-LAD, SVG-OM1, SVG-PDA->RPL); c. 07/2015 MV: Basal and proximal septal infarct w/o ischemia. EF 57%-->low risk.  . First degree AV block 01/28/2019   Noted on EKG  . GERD (gastroesophageal reflux disease)   . Gout   . HTN (hypertension)   . Macrocytosis without anemia 01/08/2012  . Moderate aortic stenosis    a. 10/2012 Echo: EF 55-60%, no rwma, BAE, mild AI, mild TR; b. 06/2020 Echo: EF 50-55%, no rwma, mild LVH. Mod dil LA. Mild MR. Mod AS.  Marland Kitchen [name] affect 02/09/2020  . 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 Sleep apnea    Questionable  . Spinal stenosis   . Stroke [facility])     Past Surgical History:  Procedure Laterality Date  . CARDIAC CATHETERIZATION  11/07/2012   Dr Acie Fredrickson  . CATARACT EXTRACTION W/ INTRAOCULAR LENS IMPLANT Bilateral   . CORONARY ARTERY BYPASS GRAFT N/A 11/11/2012   Procedure: CORONARY ARTERY BYPASS GRAFTING times four using Right Greater Saphenous Vein Graft harvested endoscopically and Left Internal Mammary Artery.;  Surgeon:  Ivin Poot, MD;  Location: [place];  Service: Open Heart Surgery;  Laterality: N/A;  . INGUINAL HERNIA REPAIR Right 01/10/2017   Procedure: OPEN RIGHT HERNIA REPAIR INGUINAL;  Surgeon: Ileana Roup, MD;  Location: [facility];  Service: General;  Laterality: Right;  . INSERTION OF MESH Right 01/10/2017   Procedure: INSERTION OF MESH;  Surgeon: Ileana Roup, MD;  Location: [facility];  Service: General;  Laterality: Right;  . INTRAOPERATIVE TRANSESOPHAGEAL ECHOCARDIOGRAM N/A 11/11/2012   Procedure: INTRAOPERATIVE TRANSESOPHAGEAL ECHOCARDIOGRAM;  Surgeon: Ivin Poot, MD;  Location: [place];  Service: Open Heart Surgery;  Laterality: N/A;  . LEFT HEART CATHETERIZATION WITH CORONARY ANGIOGRAM N/A 11/07/2012   Procedure: LEFT HEART CATHETERIZATION WITH CORONARY ANGIOGRAM;  Surgeon: Thayer Headings, MD;  Location: [facility] CATH LAB;  Service: Cardiovascular;  Laterality: N/A;  . LUMBAR LAMINECTOMY/DECOMPRESSION MICRODISCECTOMY N/A 02/05/2019   Procedure: LUMBAR LAMINECTOMY/DECOMPRESSION L3-L4;  Surgeon: Latanya Maudlin, MD;  Location: [facility];  Service: Orthopedics;  Laterality: N/A;  31min  . LYMPH NODE DISSECTION     Bilateral pelvic  . RETROPUBIC PROSTATECTOMY     Radical     Allergies  Allergies  Allergen Reactions  . Crestor [Rosuvastatin] Other (See Comments)    GYNECOMASTIA AND SWELLING    History of Present Illness    87 y/o ? w/ the above PMH including CAD, HTN, HL,  mod AS, CKD III, stroke, prostate cancer, and bifascicular block.  In the setting of [place], he underwent cath in 10/2012 revealing severe multivessel CAD.  He subsequently underwent CABG x 4 in 10/2012.  Nuc study in 07/2015 was low risk w/ nl EF.  Echo in 01/2019 showed EF 60-65% and mild AS.  He is followed by Dr. Percival Spanish in [place] and was feeling well @ his last visit in March 2021.  Pt lives locally w/ his wife.  He is relatively active.  He reports about a 1 year history of dark stools in the setting of iron therapy.   He has not noticed any recent change to his stool color/character.  He was in his USOH until late last week, when he began to experience fatigue, DOE, and exertional chest heaviness, similar to prior angina.  By the morning of 4/9, he felt very weak and noticed dyspnea on exertion and chest tenderness with minimal activity around his home.  As result, he presented to the [facility], ED where he was found to be anemic with a hemoglobin of 6.1 and hematocrit of 19.6.  WBCs were elevated at 15.2.  Creatinine was at baseline at 1.40.  Chest x-ray notable for subtle reticulonodular opacities at the lung bases with question of atypical infection versus viral pneumonia.  COVID-19 swab returned positive.  Stool was guaiac positive.  HsTrop was found to be elevated @ 1556  1572.  He was admitted and transfused x 3.  H/H 8.0/24.3 this AM.  He denies c/p or dyspnea since admission.  ECG w/o acute ST/T changes.  Inpatient Medications    . vitamin C  500 mg Oral Daily  . Chlorhexidine Gluconate Cloth  6 each Topical Daily  . fluticasone  2 spray Each Nare Daily  . [START ON 06/29/2020] pantoprazole  40 mg Intravenous Q12H  . zinc sulfate  220 mg Oral Daily    Family History    Family History  Problem Relation Age of Onset  . Aneurysm Father        Cerebral  . Stroke Father   . Hypertension Father   . Cancer Mother        colon  . Parkinson's disease Son    He indicated that his mother is deceased. He indicated that his father is deceased. He indicated that his brother is alive. He indicated that his maternal grandmother is deceased. He indicated that his maternal grandfather is deceased. He indicated that his paternal grandmother is deceased. He indicated that his paternal grandfather is deceased. He indicated that all of his three sons are alive.  Social History    Social History   Socioeconomic History  . Marital status: Married    Spouse name: [name]  . Number of children: 3  . Years of  education: 70  . Highest education level: High school graduate  Occupational History  . Occupation: Retired  Tobacco Use  . Smoking status: Never Smoker  . Smokeless tobacco: Never Used  Vaping Use  . Vaping Use: Never used  Substance and Sexual Activity  . Alcohol use: Yes    Comment: Rare  . Drug use: No  . Sexual activity: Not Currently  Other Topics Concern  . Not on file  Social History Narrative   Lives at home with wife/sons will stay with them occassionally   Married with 3 children   Denies caffeine use    Social Determinants of Health   Financial Resource Strain: Not on file  Food Insecurity: Not on file  Transportation Needs: Not on file  Physical Activity: Not on file  Stress: Not on file  Social Connections: Not on file  Intimate Partner Violence: Not on file     Review of Systems    General:  +++ wkns/malaise/fatigue. No chills, fever, night sweats or weight changes.  Cardiovascular:  +++ exertional chest pain, +++ dyspnea on exertion, no edema, orthopnea, palpitations, paroxysmal nocturnal dyspnea. Dermatological: No rash, lesions/masses Respiratory: No cough, +++ dyspnea Urologic: No hematuria, dysuria Abdominal:   No nausea, vomiting, diarrhea, bright red blood per rectum, melena, or hematemesis Neurologic:  No visual changes, wkns, changes in mental status. All other systems reviewed and are otherwise negative except as noted above.  Physical Exam    Blood pressure (!) 127/55, pulse (!) 51, temperature 98.4 F (36.9 C), temperature source Oral, resp. rate 11, height 6' (1.829 m), weight 69.5 kg, SpO2 100 %.  General: Pleasant, NAD Psych: Normal affect. Neuro: Alert and oriented X 3. Moves all extremities spontaneously. HEENT: Normal  Neck: Supple without bruits or JVD. Lungs:  Resp regular and unlabored, diminished breath sounds @ bilat bases, otw CTA. Heart: RRR, 2/6 SEM throughout, no s3, s4, or murmurs. Abdomen: Soft, non-tender,  non-distended, BS + x 4.  Extremities: No clubbing, cyanosis or edema. DP/PT2+, Radials 1+ and equal bilaterally.  Labs    Cardiac Enzymes Recent Labs  Lab 06/25/20 1142 06/25/20 1502  TROPONINIHS 1,556* 1,572*      Lab Results  Component Value Date   WBC 7.8 06/27/2020   HGB 8.0 (L) 06/27/2020   HCT 24.3 (L) 06/27/2020   MCV 95.7 06/27/2020   PLT 155 06/27/2020    Recent Labs  Lab 06/27/20 0435  NA 140  K 3.3*  CL 111  CO2 21*  BUN 40*  CREATININE 1.19  CALCIUM 8.1*  PROT 4.6*  BILITOT 1.0  ALKPHOS 37*  ALT 17  AST 30  GLUCOSE 99   Lab Results  Component Value Date   CHOL 159 05/27/2019   HDL 57 05/27/2019   LDLCALC 89 05/27/2019   TRIG 64 05/27/2019   Lab Results  Component Value Date   DDIMER 2.20 (H) 06/27/2020     Radiology Studies    DG CHEST PORT 1 VIEW  Result Date: 06/25/2020 CLINICAL DATA:  COVID 19 positive. EXAM: PORTABLE CHEST 1 VIEW COMPARISON:  CT chest dated January 18, 2020. Chest x-ray dated August 29, 2017. FINDINGS: Unchanged mild cardiomegaly status post CABG. Normal pulmonary vascularity. Subtle reticulonodular opacities at the lung bases. No focal consolidation, pleural effusion, or pneumothorax. No acute osseous abnormality. Unchanged large hiatal hernia. IMPRESSION: 1. Subtle reticulonodular opacities at the lung bases, nonspecific, but could reflect atypical infection/viral pneumonia. Electronically Signed   By: Titus Dubin M.D.   On: 06/25/2020 14:06    ECG & Cardiac Imaging    RSR, 79, 1st deg avb, LAD, LAFB, RBBB - personally reviewed.  Assessment & Plan    1.  CAD/Demand ischemia:  Pt w/o h/o CAD s/p  CABG x 4 in 2014 and low-risk MV in 2017.  He had been doing well but late last week noted increasing DOE and exertional c/p.    2.  GIB/Acute blood loss anemia::  EGD today with Rehman Hct stable   3.  COVID-19 infection: sats ok CT with basilar reticulonodular opacities has had remdesivir   4.  Essential HTN::  Stable    5.  HL:  Check labs not on statin  history of CABG   6.  CKD III:  Cr stable 1.19 today   7.  Moderate Ao Stenosis: stable gradients not likely even a TAVR candidate   8.  Hypokalemia: K 3.3 supplement   Signed, Murray Hodgkins, NP 06/27/2020, 8:06 AM  Patient examined chart reviewed Discussed care with patient and NP. Exam with elderly male moderate AS murmur basilar atelectasis TTE low risk with EF 50-55% stable moderate AS no effusion. Troponin elevation may be from COVID no signs of  Acute ischemic syndrome or ECG changes. He is not a candidate for cath with acute GI bleed now post 3 units transfusion  No ASA/plavix or heparin until GI/anemia issues resolved. Will likely need outpatient myovue to risk stratify Seen by [facility] plan for EGD given melena and iron deficiency anemia. If negative may need colon risk of angiodysplasia with AS.    Jenkins Rouge MD [facility] For questions or updates, please contact   Please consult [email or web address] for contact info under Cardiology/STEMI.

## 2020-06-27 NOTE — Op Note (Signed)
[facility] Patient Name: Jon Reid Procedure Date: 06/27/2020 2:59 PM MRN: [id number] Date of Birth: 13-Apr-1928 Attending MD: Elon Alas. Abbey Chatters DO CSN: [id number] Age: 87 Admit Type: Outpatient Procedure:                Upper GI endoscopy Indications:              Acute post hemorrhagic anemia, Melena Providers:                Elon Alas. Abbey Chatters, DO, Crystal Page, Aram Candela Referring MD:              Medicines:                See the Anesthesia note for documentation of the                            administered medications Complications:            No immediate complications. Estimated Blood Loss:     Estimated blood loss was minimal. Procedure:                Pre-Anesthesia Assessment:                           - The anesthesia plan was to use monitored                            anesthesia care (MAC).                           After obtaining informed consent, the endoscope was                            passed under direct vision. Throughout the                            procedure, the patient's blood pressure, pulse, and                            oxygen saturations were monitored continuously. The                            [id number]-[id number] ([id number]) scope was introduced through the                            mouth, and advanced to the second part of duodenum.                            The upper GI endoscopy was accomplished without                            difficulty. The patient tolerated the procedure                            well. Scope In: 3:29:34 PM Scope Out: 3:34:50 PM Total Procedure Duration: 0 hours 5 minutes 16 seconds  Findings:      A large hiatal hernia was present.  LA Grade D (one or more mucosal breaks involving at least 75% of       esophageal circumference) esophagitis with no bleeding was found in the       lower third of the esophagus.      There is no endoscopic evidence of bleeding or ulceration in the entire       examined stomach.       The duodenal bulb, first portion of the duodenum and second portion of       the duodenum were normal. Impression:               - Large hiatal hernia.                           - LA Grade D erosive esophagitis with no bleeding.                           - Normal duodenal bulb, first portion of the                            duodenum and second portion of the duodenum.                           - No specimens collected. Moderate Sedation:      Per Anesthesia Care Recommendation:           - Return patient to hospital ward for ongoing care.                           - Clear liquid diet.                           - Continue on IV Protonix while inpatient. I would                            add Carafate liquid 4 times daily. Clear liquids                            okay today. Patient will need repeat EGD in 8 weeks                            which we will arrange. GI to continue to follow. Procedure Code(s):        --- Professional ---                           [phone], Esophagogastroduodenoscopy, flexible,                            transoral; diagnostic, including collection of                            specimen(s) by brushing or washing, when performed                            (separate procedure) Diagnosis Code(s):        --- Professional ---  K44.9, Diaphragmatic hernia without obstruction or                            gangrene                           K22.11, Ulcer of esophagus with bleeding                           K20.80, Other esophagitis without bleeding                           D62, Acute posthemorrhagic anemia                           K92.1, Melena (includes Hematochezia) CPT copyright 2019 American Medical Association. All rights reserved. The codes documented in this report are preliminary and upon coder review may  be revised to meet current compliance requirements. Elon Alas. Abbey Chatters, DO [place] Abbey Chatters, DO 06/27/2020 3:45:09 PM This  report has been signed electronically. Number of Addenda: 0

## 2020-06-27 NOTE — Transfer of Care (Signed)
Immediate Anesthesia Transfer of Care Note  Patient: Jon Reid  Procedure(s) Performed: ESOPHAGOGASTRODUODENOSCOPY (EGD) WITH PROPOFOL (N/A )  Patient Location: Endoscopy Unit  Anesthesia Type:General  Level of Consciousness: drowsy  Airway & Oxygen Therapy: Patient Spontanous Breathing and Patient connected to nasal cannula oxygen  Post-op Assessment: Report given to RN and Post -op Vital signs reviewed and stable  Post vital signs: Reviewed and stable  Last Vitals:  Vitals Value Taken Time  BP    Temp    Pulse    Resp    SpO2      Last Pain:  Vitals:   06/27/20 1522  TempSrc:   PainSc: 0-No pain      Patients Stated Pain Goal: 0 (98/72/15 [id number])  Complications: No complications documented.

## 2020-06-27 NOTE — Telephone Encounter (Signed)
Jon Reid, needs office visit in next 6 weeks. We need to get him on the books for an EGD in 8 weeks.

## 2020-06-27 NOTE — Progress Notes (Addendum)
PROGRESS NOTE  Jon Reid DPO:[id number] DOB: [birth year] DOA: 06/25/2020 PCP: Claretta Fraise, MD  Brief History:  87 y.o.malewith medical history ofknee pain, prostate adenocarcinoma, coronary artery disease, CKD stage III, hypertension, stroke, aortic stenosis, right bundle branch block presenting with nausea, vomiting, and generalized weakness. The patient states that he has had nausea and vomiting for the better part of 2 weeks. His generalized weakness has gradually worsened during this time to the point where he was having difficulty getting out of bed on the morning of 06/25/2020. As result, the patient was brought to the emergency department for further evaluation. He states that his emesis is very dark looking almost coffee-ground like. He denies any bright red blood with emesis. He denies any diarrhea, hematochezia, but admits to having dark/black stools. He has had some subjective fevers and chills. He states that he has been taking Celebrex for at least 1 month. His dose of Celebrex was recently increased on 06/21/2020. He denies any other over-the-counter NSAIDs. He has had some exertional chest discomfort and shortness of breath over the past week. He denies any coughing or hemoptysis. There is no dysuria or hematuria. He states that he has been vaccinated x3 for COVID-19.  In the emergency department, the patient had a low-grade temperature 99.0 F. He was hemodynamically stable with oxygen saturation of 100% on room air. BMP was essentially unremarkable with serum creatinine of 1.40 which is near his baseline. WBC 15.2, hemoglobin 6.1, platelets 226,000. 2 units PRBC were ordered. GI was consulted to assist with management. FOBT with brown stool, heme +  Assessment/Plan: Upper GI bleed/symptomatic anemia -The patient endorses Celebrex use -GI consulted -Plans noted for possible EGD -continue Protonix drip -3 units PRBC have been given  total  COVID-19pneumonia -chest x-ray--personally reviewed--scattered interstitial opacities -Start remdesivir--D#3 of 3 -Stable on room air -CRP 0.6>>0.5>>0.6 -Ferritin 13>>16>>17 -D-Dimer 1.34>>2.20 -PCT <0.10 -Vitamin C and zinc  Coronary artery disease/chest pain/Elevated Troponin -troponins 1556>>1572 -Obtain EKG--sinus, RBBB -no chest pain since time of arrival -appreciate cardiology consult-->not candidate presently for cath or [facility] -06/26/20--Echo EF 50-55%, distal septal HK, moderate AS, mild MR, mild AI  CKD stage IIIb -Baseline creatinine 1.4-1.6 -Monitor BMP -Holding furosemide  Leukocytosis -Check PCT <0.10 -Check chest x-ray--no consolidation -UA--no pyuria -likely stress demargination -remains afebrile and hemodynamically stable -Blood cultures x2 sets--neg to date -overall improved  Aortic stenosis -Outpatient cardiology follow-up  History of stroke -Holding aspirin in the setting of GI bleed       Status is: Inpatient  Remains inpatient appropriate because:IV treatments appropriate due to intensity of illness or inability to take PO   Dispo: The patient is from: Home  Anticipated d/c is to: Home  Patient currently is not medically stable to d/c.              Difficult to place patient No        Family Communication:  son updated 06/27/20  Consultants:  GI, cardiologye  Code Status:  FULL   DVT Prophylaxis:  SCDs   Procedures: As Listed in Progress Note Above  Antibiotics: None     Subjective: Patient denies fevers, chills, headache, chest pain, dyspnea, nausea, vomiting, diarrhea, abdominal pain, dysuria, hematuria, hematochezia, and melena.   Objective: Vitals:   06/27/20 1100 06/27/20 1200 06/27/20 1300 06/27/20 1400  BP: (!) 121/37 (!) 131/46 (!) 125/36 (!) 135/42  Pulse: (!) 56 64 (!) 55 (!) 59  Resp: 15 20 17  15  Temp:      TempSrc:      SpO2: 97% 99% 99%  100%  Weight:      Height:        Intake/Output Summary (Last 24 hours) at 06/27/2020 1441 Last data filed at 06/27/2020 0636 Gross per 24 hour  Intake 1553.6 ml  Output 1250 ml  Net 303.6 ml   Weight change:  Exam:   General:  Pt is alert, follows commands appropriately, not in acute distress  HEENT: No icterus, No thrush, No neck mass, [place]/AT  Cardiovascular: RRR, S1/S2, no rubs, no gallops  Respiratory:bibasilar rales. No wheeze  Abdomen: Soft/+BS, non tender, non distended, no guarding  Extremities: No edema, No lymphangitis, No petechiae, No rashes, no synovitis   Data Reviewed: I have personally reviewed following labs and imaging studies Basic Metabolic Panel: Recent Labs  Lab 06/25/20 1142 06/26/20 0600 06/27/20 0435  NA 138 137 140  K 4.0 3.6 3.3*  CL 105 107 111  CO2 23 25 21*  GLUCOSE 124* 99 99  BUN 68* 50* 40*  CREATININE 1.40* 1.27* 1.19  CALCIUM 9.5 8.3* 8.1*  MG  --   --  1.9   Liver Function Tests: Recent Labs  Lab 06/25/20 1142 06/26/20 0600 06/27/20 0435  AST 27  27 35 30  ALT 18  19 17 17   ALKPHOS 43  42 33* 37*  BILITOT 0.9  0.8 1.3* 1.0  PROT 5.8*  5.8* 5.3* 4.6*  ALBUMIN 3.7  3.7 3.3* 2.8*   Recent Labs  Lab 06/25/20 1142  LIPASE 24   No results for input(s): AMMONIA in the last 168 hours. Coagulation Profile: No results for input(s): INR, PROTIME in the last 168 hours. CBC: Recent Labs  Lab 06/25/20 1142 06/26/20 0600 06/27/20 0435  WBC 15.2* 10.3 7.8  NEUTROABS 12.7* 7.1 5.3  HGB 6.1* 7.6* 8.0*  HCT 19.6* 23.9* 24.3*  MCV 104.8* 99.2 95.7  PLT 226 175 155   Cardiac Enzymes: No results for input(s): CKTOTAL, CKMB, CKMBINDEX, TROPONINI in the last 168 hours. BNP: Invalid input(s): POCBNP CBG: No results for input(s): GLUCAP in the last 168 hours. HbA1C: No results for input(s): HGBA1C in the last 72 hours. Urine analysis:    Component Value Date/Time   COLORURINE YELLOW 06/25/2020 1250    APPEARANCEUR CLEAR 06/25/2020 1250   LABSPEC 1.018 06/25/2020 1250   PHURINE 5.0 06/25/2020 1250   GLUCOSEU NEGATIVE 06/25/2020 1250   HGBUR NEGATIVE 06/25/2020 1250   BILIRUBINUR NEGATIVE 06/25/2020 1250   BILIRUBINUR neg 07/07/2012 1455   KETONESUR 5 (A) 06/25/2020 1250   PROTEINUR NEGATIVE 06/25/2020 1250   UROBILINOGEN 0.2 11/08/2012 1927   NITRITE NEGATIVE 06/25/2020 1250   LEUKOCYTESUR NEGATIVE 06/25/2020 1250   Sepsis Labs: @LABRCNTIP (procalcitonin:4,lacticidven:4) ) Recent Results (from the past 240 hour(s))  Resp Panel by RT-PCR (Flu A&B, Covid) Nasopharyngeal Swab     Status: Abnormal   Collection Time: 06/25/20 11:42 AM   Specimen: Nasopharyngeal Swab; Nasopharyngeal(NP) swabs in vial transport medium  Result Value Ref Range Status   SARS Coronavirus 2 by RT PCR POSITIVE (A) NEGATIVE Final    Comment: RESULT CALLED TO, READ BACK BY AND VERIFIED WITH: LONG L.@ 1252 ON [id number] BY HENDERSON L (NOTE) SARS-CoV-2 target nucleic acids are DETECTED.  The SARS-CoV-2 RNA is generally detectable in upper respiratory specimens during the acute phase of infection. Positive results are indicative of the presence of the identified virus, but do not rule out bacterial infection or co-infection with  other pathogens not detected by the test. Clinical correlation with patient history and other diagnostic information is necessary to determine patient infection status. The expected result is Negative.  Fact Sheet for Patients: EntrepreneurPulse.com.au  Fact Sheet for Healthcare Providers: IncredibleEmployment.be  This test is not yet approved or cleared by the [place] FDA and  has been authorized for detection and/or diagnosis of SARS-CoV-2 by FDA under an Emergency Use Authorization (EUA).  This EUA will remain in effect (meaning this test c an be used) for the duration of  the COVID-19 declaration under Section 564(b)(1) of the Act,  21 U.S.C. section 360bbb-3(b)(1), unless the authorization is terminated or revoked sooner.     Influenza A by PCR NEGATIVE NEGATIVE Final   Influenza B by PCR NEGATIVE NEGATIVE Final    Comment: (NOTE) The Xpert Xpress SARS-CoV-2/FLU/RSV plus assay is intended as an aid in the diagnosis of influenza from Nasopharyngeal swab specimens and should not be used as a sole basis for treatment. Nasal washings and aspirates are unacceptable for Xpert Xpress SARS-CoV-2/FLU/RSV testing.  Fact Sheet for Patients: EntrepreneurPulse.com.au  Fact Sheet for Healthcare Providers: IncredibleEmployment.be  This test is not yet approved or cleared by the [place] FDA and has been authorized for detection and/or diagnosis of SARS-CoV-2 by FDA under an Emergency Use Authorization (EUA). This EUA will remain in effect (meaning this test can be used) for the duration of the COVID-19 declaration under Section 564(b)(1) of the Act, 21 U.S.C. section 360bbb-3(b)(1), unless the authorization is terminated or revoked.  Performed at [facility], [address] [place], [place] [address]   Culture, blood (Routine X 2) w Reflex to ID Panel     Status: None (Preliminary result)   Collection Time: 06/25/20  1:45 PM   Specimen: Blood  Result Value Ref Range Status   Specimen Description   Final    BLOOD LEFT HAND BOTTLES DRAWN AEROBIC AND ANAEROBIC   Special Requests   Final    Blood Culture adequate volume Performed at [facility], [address] [place], [place] [address]    Culture PENDING  Incomplete   Report Status PENDING  Incomplete  Culture, blood (Routine X 2) w Reflex to ID Panel     Status: None (Preliminary result)   Collection Time: 06/25/20  1:45 PM   Specimen: Blood  Result Value Ref Range Status   Specimen Description BLOOD LEFT ARM BOTTLES DRAWN AEROBIC AND ANAEROBIC  Final   Special Requests   Final    Blood Culture adequate volume Performed  at [facility], [address], [place], [place] [address]    Culture PENDING  Incomplete   Report Status PENDING  Incomplete  MRSA PCR Screening     Status: None   Collection Time: 06/25/20  7:44 PM   Specimen: Nasal Mucosa; Nasopharyngeal  Result Value Ref Range Status   MRSA by PCR NEGATIVE NEGATIVE Final    Comment:        The GeneXpert MRSA Assay (FDA approved for NASAL specimens only), is one component of a comprehensive MRSA colonization surveillance program. It is not intended to diagnose MRSA infection nor to guide or monitor treatment for MRSA infections. Performed at [facility], [address] [place], [place] [address]      Scheduled Meds: . vitamin C  500 mg Oral Daily  . Chlorhexidine Gluconate Cloth  6 each Topical Daily  . fluticasone  2 spray Each Nare Daily  . [START ON 06/29/2020] pantoprazole  40  mg Intravenous Q12H  . zinc sulfate  220 mg Oral Daily   Continuous Infusions: . sodium chloride 50 mL/hr at 06/27/20 1218  . pantoprozole (PROTONIX) infusion 8 mg/hr (06/27/20 5170)    Procedures/Studies: DG CHEST PORT 1 VIEW  Result Date: 06/25/2020 CLINICAL DATA:  COVID 19 positive. EXAM: PORTABLE CHEST 1 VIEW COMPARISON:  CT chest dated January 18, 2020. Chest x-ray dated August 29, 2017. FINDINGS: Unchanged mild cardiomegaly status post CABG. Normal pulmonary vascularity. Subtle reticulonodular opacities at the lung bases. No focal consolidation, pleural effusion, or pneumothorax. No acute osseous abnormality. Unchanged large hiatal hernia. IMPRESSION: 1. Subtle reticulonodular opacities at the lung bases, nonspecific, but could reflect atypical infection/viral pneumonia. Electronically Signed   By: Titus Dubin M.D.   On: 06/25/2020 14:06   ECHOCARDIOGRAM COMPLETE  Result Date: 06/26/2020    ECHOCARDIOGRAM REPORT   Patient Name:   COHAN STIPES Date of Exam: 06/26/2020 Medical Rec #:  [id number]      Height:       72.0 in Accession #:    [id number]      Weight:       153.2 lb Date of Birth:  Feb 08, 1929      BSA:          1.902 m Patient Age:    87 years       BP:           106/45 mmHg Patient Gender: M              HR:           52 bpm. Exam Location:  [facility] Procedure: 2D Echo, Cardiac Doppler and Color Doppler Indications:    NSTEMI  History:        Patient has prior history of Echocardiogram examinations, most                 recent 02/09/2019. CAD, Prior CABG, Stroke, Aortic Valve Disease                 and Moderate aortic stenosis, Arrythmias:RBBB;                 Signs/Symptoms:Shortness of Breath and Chest Pain. COVID+, CKD.  Sonographer:    Dustin Flock RDCS Referring Phys: [phone] Harlee Pursifull IMPRESSIONS  1. Distal septal hyokinesis. Left ventricular ejection fraction, by estimation, is 50 to 55%. The left ventricle has low normal function. The left ventricle demonstrates regional wall motion abnormalities (see scoring diagram/findings for description). There is mild left ventricular hypertrophy. Left ventricular diastolic parameters are indeterminate.  2. Right ventricular systolic function is normal. The right ventricular size is normal. There is mildly elevated pulmonary artery systolic pressure.  3. Left atrial size was moderately dilated.  4. The mitral valve is abnormal. Mild mitral valve regurgitation. No evidence of mitral stenosis. Moderate mitral annular calcification.  5. The aortic valve is tricuspid. There is moderate calcification of the aortic valve. There is moderate thickening of the aortic valve. Aortic valve regurgitation is mild. Moderate aortic valve stenosis.  6. The inferior vena cava is normal in size with greater than 50% respiratory variability, suggesting right atrial pressure of 3 mmHg. FINDINGS  Left Ventricle: Distal septal hyokinesis. Left ventricular ejection fraction, by estimation, is 50 to 55%. The left ventricle has low normal function. The left ventricle demonstrates regional wall motion abnormalities. The left  ventricular internal cavity size was normal in size. There is mild left ventricular hypertrophy. Left ventricular diastolic parameters are  indeterminate. Right Ventricle: The right ventricular size is normal. No increase in right ventricular wall thickness. Right ventricular systolic function is normal. There is mildly elevated pulmonary artery systolic pressure. The tricuspid regurgitant velocity is 3.00  m/s, and with an assumed right atrial pressure of 3 mmHg, the estimated right ventricular systolic pressure is 69.6 mmHg. Left Atrium: Left atrial size was moderately dilated. Right Atrium: Right atrial size was normal in size. Pericardium: There is no evidence of pericardial effusion. Mitral Valve: The mitral valve is abnormal. There is mild thickening of the mitral valve leaflet(s). There is mild calcification of the mitral valve leaflet(s). Moderate mitral annular calcification. Mild mitral valve regurgitation. No evidence of mitral  valve stenosis. Tricuspid Valve: The tricuspid valve is normal in structure. Tricuspid valve regurgitation is mild . No evidence of tricuspid stenosis. Aortic Valve: The aortic valve is tricuspid. There is moderate calcification of the aortic valve. There is moderate thickening of the aortic valve. There is moderate aortic valve annular calcification. Aortic valve regurgitation is mild. Moderate aortic stenosis is present. Aortic valve mean gradient measures 20.5 mmHg. Aortic valve peak gradient measures 36.8 mmHg. Aortic valve area, by VTI measures 1.20 cm. Pulmonic Valve: The pulmonic valve was normal in structure. Pulmonic valve regurgitation is not visualized. No evidence of pulmonic stenosis. Aorta: The aortic root is normal in size and structure. Venous: The inferior vena cava is normal in size with greater than 50% respiratory variability, suggesting right atrial pressure of 3 mmHg. IAS/Shunts: The interatrial septum was not well visualized.  LEFT VENTRICLE PLAX 2D LVIDd:          4.14 cm  Diastology LVIDs:         2.65 cm  LV e' medial:    4.90 cm/s LV PW:         1.15 cm  LV E/e' medial:  21.0 LV IVS:        1.19 cm  LV e' lateral:   8.49 cm/s LVOT diam:     2.20 cm  LV E/e' lateral: 12.1 LV SV:         93 LV SV Index:   49 LVOT Area:     3.80 cm  RIGHT VENTRICLE RV Basal diam:  3.73 cm RV S prime:     8.27 cm/s TAPSE (M-mode): 2.4 cm LEFT ATRIUM              Index       RIGHT ATRIUM           Index LA diam:        4.70 cm  2.47 cm/m  RA Area:     21.60 cm LA Vol (A2C):   71.6 ml  37.64 ml/m RA Volume:   64.90 ml  34.11 ml/m LA Vol (A4C):   110.0 ml 57.82 ml/m LA Biplane Vol: 95.9 ml  50.41 ml/m  AORTIC VALVE AV Area (Vmax):    1.13 cm AV Area (Vmean):   1.13 cm AV Area (VTI):     1.20 cm AV Vmax:           303.33 cm/s AV Vmean:          214.500 cm/s AV VTI:            0.772 m AV Peak Grad:      36.8 mmHg AV Mean Grad:      20.5 mmHg LVOT Vmax:         90.10 cm/s LVOT Vmean:  63.900 cm/s LVOT VTI:          0.244 m LVOT/AV VTI ratio: 0.32  AORTA Ao Root diam: 3.10 cm MITRAL VALVE                TRICUSPID VALVE MV Area (PHT): 4.04 cm     TR Peak grad:   36.0 mmHg MV Decel Time: 188 msec     TR Vmax:        300.00 cm/s MV E velocity: 103.00 cm/s MV A velocity: 60.80 cm/s   SHUNTS MV E/A ratio:  1.69         Systemic VTI:  0.24 m                             Systemic Diam: 2.20 cm Jenkins Rouge MD Electronically signed by Jenkins Rouge MD Signature Date/Time: 06/26/2020/10:39:59 AM    Final     Orson Eva, DO  [facility]  If 7PM-7AM, please contact night-coverage [email or web address] Password [email or web address] 06/27/2020, 2:41 PM   LOS: 2 days

## 2020-06-27 NOTE — Progress Notes (Signed)
Consent signed for EGD and placed in front of chart.

## 2020-06-27 NOTE — Interval H&P Note (Signed)
History and Physical Interval Note:  06/27/2020 3:28 PM  Jon Reid  has presented today for surgery, with the diagnosis of Melena and an iron deficiency anemia.  The various methods of treatment have been discussed with the patient and family. After consideration of risks, benefits and other options for treatment, the patient has consented to  Procedure(s): ESOPHAGOGASTRODUODENOSCOPY (EGD) WITH PROPOFOL (N/A) as a surgical intervention.  The patient's history has been reviewed, patient examined, no change in status, stable for surgery.  I have reviewed the patient's chart and labs.  Questions were answered to the patient's satisfaction.     [name]

## 2020-06-27 NOTE — Anesthesia Postprocedure Evaluation (Signed)
Anesthesia Post Note  Patient: Jon Reid  Procedure(s) Performed: ESOPHAGOGASTRODUODENOSCOPY (EGD) WITH PROPOFOL (N/A )  Patient location during evaluation: ICU Anesthesia Type: General Level of consciousness: awake and alert and oriented Pain management: pain level controlled Vital Signs Assessment: post-procedure vital signs reviewed and stable Respiratory status: spontaneous breathing and respiratory function stable Cardiovascular status: blood pressure returned to baseline and stable Postop Assessment: no apparent nausea or vomiting Anesthetic complications: no   No complications documented.   Last Vitals:  Vitals:   06/27/20 1451 06/27/20 1500  BP:  (!) 136/59  Pulse: 62 (!) 57  Resp: 17 16  Temp:    SpO2: 100% 99%    Last Pain:  Vitals:   06/27/20 1522  TempSrc:   PainSc: 0-No pain                 [name]

## 2020-06-27 NOTE — Anesthesia Preprocedure Evaluation (Addendum)
Anesthesia Evaluation  Patient identified by MRN, date of birth, ID band Patient awake    Reviewed: Allergy & Precautions, NPO status , Patient's Chart, lab work & pertinent test results  History of Anesthesia Complications Negative for: history of anesthetic complications  Airway Mallampati: III  TM Distance: >3 FB Neck ROM: Full    Dental  (+) Dental Advisory Given, Partial Lower, Partial Upper   Pulmonary sleep apnea ,    Pulmonary exam normal breath sounds clear to auscultation       Cardiovascular Exercise Tolerance: Good hypertension, Pt. on medications + angina with exertion + CAD, + Past MI (elevated troponins ), + CABG and + Peripheral Vascular Disease  Normal cardiovascular exam+ dysrhythmias + Valvular Problems/Murmurs AS  Rhythm:Regular Rate:Normal + Systolic murmurs High troponin, reviewed Cardiology note and echo  1. Distal septal hyokinesis. Left ventricular ejection fraction, by estimation, is 50 to 55%. The left ventricle has low normal function. The left ventricle demonstrates regional wall motion abnormalities (see scoring diagram/findings for description).  There is mild left ventricular hypertrophy. Left ventricular diastolic parameters are indeterminate.  2. Right ventricular systolic function is normal. The right ventricular  size is normal. There is mildly elevated pulmonary artery systolic  pressure.  3. Left atrial size was moderately dilated.  4. The mitral valve is abnormal. Mild mitral valve regurgitation. No evidence of mitral stenosis. Moderate mitral annular calcification.  5. The aortic valve is tricuspid. There is moderate calcification of the aortic valve. There is moderate thickening of the aortic valve. Aortic valve regurgitation is mild. Moderate aortic valve stenosis.  6. The inferior vena cava is normal in size with greater than 50% respiratory variability, suggesting right atrial pressure  of 3 mmHg.    Neuro/Psych  Neuromuscular disease CVA, No Residual Symptoms negative psych ROS   GI/Hepatic Neg liver ROS, GERD  Medicated and Controlled,  Endo/Other  negative endocrine ROS  Renal/GU Renal InsufficiencyRenal disease     Musculoskeletal  (+) Arthritis  (back sx),   Abdominal   Peds  Hematology  (+) anemia ,   Anesthesia Other Findings Right hand weakness after back sx  Reproductive/Obstetrics negative OB ROS                           Anesthesia Physical Anesthesia Plan  ASA: IV  Anesthesia Plan: General   Post-op Pain Management:    Induction: Intravenous  PONV Risk Score and Plan: Propofol infusion  Airway Management Planned: Nasal Cannula and Natural Airway  Additional Equipment:   Intra-op Plan:   Post-operative Plan: Possible Post-op intubation/ventilation  Informed Consent: I have reviewed the patients History and Physical, chart, labs and discussed the procedure including the risks, benefits and alternatives for the proposed anesthesia with the patient or authorized representative who has indicated his/her understanding and acceptance.     Dental advisory given  Plan Discussed with: CRNA and Surgeon  Anesthesia Plan Comments:        Anesthesia Quick Evaluation

## 2020-06-28 DIAGNOSIS — K922 Gastrointestinal hemorrhage, unspecified: Secondary | ICD-10-CM

## 2020-06-28 LAB — COMPREHENSIVE METABOLIC PANEL
ALT: 20 U/L (ref 0–44)
AST: 27 U/L (ref 15–41)
Albumin: 2.8 g/dL — ABNORMAL LOW (ref 3.5–5.0)
Alkaline Phosphatase: 42 U/L (ref 38–126)
Anion gap: 9 (ref 5–15)
BUN: 27 mg/dL — ABNORMAL HIGH (ref 8–23)
CO2: 21 mmol/L — ABNORMAL LOW (ref 22–32)
Calcium: 7.8 mg/dL — ABNORMAL LOW (ref 8.9–10.3)
Chloride: 110 mmol/L (ref 98–111)
Creatinine, Ser: 1.22 mg/dL (ref 0.61–1.24)
GFR, Estimated: 57 mL/min — ABNORMAL LOW (ref >60)
Glucose, Bld: 101 mg/dL — ABNORMAL HIGH (ref 70–99)
Potassium: 3.4 mmol/L — ABNORMAL LOW (ref 3.5–5.1)
Sodium: 140 mmol/L (ref 135–145)
Total Bilirubin: 1.2 mg/dL (ref 0.3–1.2)
Total Protein: 4.6 g/dL — ABNORMAL LOW (ref 6.5–8.1)

## 2020-06-28 LAB — CBC WITH DIFFERENTIAL/PLATELET
Abs Immature Granulocytes: 0.03 10*3/uL (ref 0.00–0.07)
Basophils Absolute: 0 10*3/uL (ref 0.0–0.1)
Basophils Relative: 1 %
Eosinophils Absolute: 0.2 10*3/uL (ref 0.0–0.5)
Eosinophils Relative: 2 %
HCT: 26.4 % — ABNORMAL LOW (ref 39.0–52.0)
Hemoglobin: 8.6 g/dL — ABNORMAL LOW (ref 13.0–17.0)
Immature Granulocytes: 0 %
Lymphocytes Relative: 20 %
Lymphs Abs: 1.7 10*3/uL (ref 0.7–4.0)
MCH: 31.6 pg (ref 26.0–34.0)
MCHC: 32.6 g/dL (ref 30.0–36.0)
MCV: 97.1 fL (ref 80.0–100.0)
Monocytes Absolute: 0.9 10*3/uL (ref 0.1–1.0)
Monocytes Relative: 10 %
Neutro Abs: 5.8 10*3/uL (ref 1.7–7.7)
Neutrophils Relative %: 67 %
Platelets: 176 10*3/uL (ref 150–400)
RBC: 2.72 MIL/uL — ABNORMAL LOW (ref 4.22–5.81)
RDW: 16.1 % — ABNORMAL HIGH (ref 11.5–15.5)
WBC: 8.6 10*3/uL (ref 4.0–10.5)
nRBC: 0 % (ref 0.0–0.2)

## 2020-06-28 LAB — FERRITIN: Ferritin: 17 ng/mL — ABNORMAL LOW (ref 24–336)

## 2020-06-28 LAB — D-DIMER, QUANTITATIVE: D-Dimer, Quant: 2.34 ug/mL-FEU — ABNORMAL HIGH (ref 0.00–0.50)

## 2020-06-28 LAB — C-REACTIVE PROTEIN: CRP: 0.9 mg/dL (ref <1.0)

## 2020-06-28 MED ORDER — ASPIRIN 81 MG PO CHEW
81.0000 mg | CHEWABLE_TABLET | Freq: Every day | ORAL | Status: DC
  Administered 2020-06-29: 81 mg via ORAL
  Filled 2020-06-28: qty 1

## 2020-06-28 NOTE — Progress Notes (Signed)
PROGRESS NOTE  Jon Reid BWI:[id number] DOB: [birth year] DOA: 06/25/2020 PCP: Claretta Fraise, MD  Brief History: 87 y.o.malewith medical history ofknee pain, prostate adenocarcinoma, coronary artery disease, CKD stage III, hypertension, stroke, aortic stenosis, right bundle branch block presenting with nausea, vomiting, and generalized weakness. The patient states that he has had nausea and vomiting for the better part of 2 weeks. His generalized weakness has gradually worsened during this time to the point where he was having difficulty getting out of bed on the morning of 06/25/2020. As result, the patient was brought to the emergency department for further evaluation. He states that his emesis is very dark looking almost coffee-ground like. He denies any bright red blood with emesis. He denies any diarrhea, hematochezia, but admits to having dark/black stools. He has had some subjective fevers and chills. He states that he has been taking Celebrex for at least 1 month. His dose of Celebrex was recently increased on 06/21/2020. He denies any other over-the-counter NSAIDs. He has had some exertional chest discomfort and shortness of breath over the past week. He denies any coughing or hemoptysis. There is no dysuria or hematuria. He states that he has been vaccinated x3 for COVID-19.  In the emergency department, the patient had a low-grade temperature 99.0 F. He was hemodynamically stable with oxygen saturation of 100% on room air. BMP was essentially unremarkable with serum creatinine of 1.40 which is near his baseline. WBC 15.2, hemoglobin 6.1, platelets 226,000. 2 units PRBC were ordered. GI was consulted to assist with management. FOBT with brown stool, heme +  Assessment/Plan: Upper GI bleed/symptomatic anemia -The patient endorses Celebrex use -GI consulted appreciate -4/11 EGD--LA Grade D erosive esophagitis with no bleeding -continueProtonix drip>>bid  protonix and carafate -plan repeat EGD in 2 months -3 units PRBC have beengiven total -advance diet -4/12--discussed with GI>>ok to start ASA 81 mg 06/29/20  COVID-19pneumonia -chest x-ray--personally reviewed--scattered interstitial opacities -Start remdesivir--D#3 of 3 -Stable on room air -CRP 0.6>>0.5>>0.6>.0.9 -Ferritin 13>>16>>17>>17 -D-Dimer 1.34>>2.20>>2.32 -PCT <0.10 -Vitamin C and zinc  Coronary artery disease/chest pain/Elevated Troponin -troponins1556>>1572 -Obtain EKG--sinus, RBBB -no chest pain since time of arrival -appreciate cardiology consult-->not candidate presently for cath or [facility] -will need outpatient myovue -06/26/20--Echo EF 50-55%, distal septal HK, moderate AS, mild MR, mild AI  CKD stage IIIb -Baseline creatinine 1.4-1.6 -Monitor BMP -Holding furosemide  Leukocytosis -Check PCT<0.10 -Check chest x-ray--no consolidation -UA--no pyuria -likely stress demargination -remains afebrile and hemodynamically stable -Blood cultures x2 sets--neg to date -overall improved  Aortic stenosis -Outpatient cardiology follow-up  History of stroke -Holding aspirin in the setting of GI bleed       Status is: Inpatient  Remains inpatient appropriate because:IV treatments appropriate due to intensity of illness or inability to take PO   Dispo: The patient is from:Home Anticipated d/c is UL:AGTX Patient currently is not medically stable to d/c. Difficult to place patient No        Family Communication:son updated 06/28/20  Consultants:GI, cardiologye  Code Status: FULL   DVT Prophylaxis: SCDs   Procedures: As Listed in Progress Note Above  Antibiotics: None     Subjective: Patient denies fevers, chills, headache, chest pain, dyspnea, nausea, vomiting, diarrhea, abdominal pain, dysuria, hematuria, hematochezia, and melena.   Objective: Vitals:   06/28/20  1300 06/28/20 1400 06/28/20 1500 06/28/20 1600  BP: (!) 148/54 (!) 96/40 (!) 102/43 (!) 127/56  Pulse: 69 77 66   Resp: 17 20 20  16  Temp:      TempSrc:      SpO2: 99% 100% 100%   Weight:      Height:        Intake/Output Summary (Last 24 hours) at 06/28/2020 1734 Last data filed at 06/28/2020 1600 Gross per 24 hour  Intake 1564.96 ml  Output 725 ml  Net 839.96 ml   Weight change:  Exam:   General:  Pt is alert, follows commands appropriately, not in acute distress  HEENT: No icterus, No thrush, No neck mass, [place]/AT  Cardiovascular: RRR, S1/S2, no rubs, no gallops  Respiratory: bibasilar rales. No wheeze  Abdomen: Soft/+BS, non tender, non distended, no guarding  Extremities: No edema, No lymphangitis, No petechiae, No rashes, no synovitis   Data Reviewed: I have personally reviewed following labs and imaging studies Basic Metabolic Panel: Recent Labs  Lab 06/25/20 1142 06/26/20 0600 06/27/20 0435 06/28/20 0428  NA 138 137 140 140  K 4.0 3.6 3.3* 3.4*  CL 105 107 111 110  CO2 23 25 21* 21*  GLUCOSE 124* 99 99 101*  BUN 68* 50* 40* 27*  CREATININE 1.40* 1.27* 1.19 1.22  CALCIUM 9.5 8.3* 8.1* 7.8*  MG  --   --  1.9  --    Liver Function Tests: Recent Labs  Lab 06/25/20 1142 06/26/20 0600 06/27/20 0435 06/28/20 0428  AST 27  27 35 30 27  ALT 18  19 17 17 20   ALKPHOS 43  42 33* 37* 42  BILITOT 0.9  0.8 1.3* 1.0 1.2  PROT 5.8*  5.8* 5.3* 4.6* 4.6*  ALBUMIN 3.7  3.7 3.3* 2.8* 2.8*   Recent Labs  Lab 06/25/20 1142  LIPASE 24   No results for input(s): AMMONIA in the last 168 hours. Coagulation Profile: No results for input(s): INR, PROTIME in the last 168 hours. CBC: Recent Labs  Lab 06/25/20 1142 06/26/20 0600 06/27/20 0435 06/28/20 0428  WBC 15.2* 10.3 7.8 8.6  NEUTROABS 12.7* 7.1 5.3 5.8  HGB 6.1* 7.6* 8.0* 8.6*  HCT 19.6* 23.9* 24.3* 26.4*  MCV 104.8* 99.2 95.7 97.1  PLT 226 175 155 176   Cardiac Enzymes: No results for  input(s): CKTOTAL, CKMB, CKMBINDEX, TROPONINI in the last 168 hours. BNP: Invalid input(s): POCBNP CBG: No results for input(s): GLUCAP in the last 168 hours. HbA1C: No results for input(s): HGBA1C in the last 72 hours. Urine analysis:    Component Value Date/Time   COLORURINE YELLOW 06/25/2020 1250   APPEARANCEUR CLEAR 06/25/2020 1250   LABSPEC 1.018 06/25/2020 1250   PHURINE 5.0 06/25/2020 1250   GLUCOSEU NEGATIVE 06/25/2020 1250   HGBUR NEGATIVE 06/25/2020 1250   BILIRUBINUR NEGATIVE 06/25/2020 1250   BILIRUBINUR neg 07/07/2012 1455   KETONESUR 5 (A) 06/25/2020 1250   PROTEINUR NEGATIVE 06/25/2020 1250   UROBILINOGEN 0.2 11/08/2012 1927   NITRITE NEGATIVE 06/25/2020 1250   LEUKOCYTESUR NEGATIVE 06/25/2020 1250   Sepsis Labs: @LABRCNTIP (procalcitonin:4,lacticidven:4) ) Recent Results (from the past 240 hour(s))  Resp Panel by RT-PCR (Flu A&B, Covid) Nasopharyngeal Swab     Status: Abnormal   Collection Time: 06/25/20 11:42 AM   Specimen: Nasopharyngeal Swab; Nasopharyngeal(NP) swabs in vial transport medium  Result Value Ref Range Status   SARS Coronavirus 2 by RT PCR POSITIVE (A) NEGATIVE Final    Comment: RESULT CALLED TO, READ BACK BY AND VERIFIED WITH: LONG L.@ 1252 ON [id number] BY HENDERSON L (NOTE) SARS-CoV-2 target nucleic acids are DETECTED.  The SARS-CoV-2 RNA is generally detectable in upper respiratory specimens  during the acute phase of infection. Positive results are indicative of the presence of the identified virus, but do not rule out bacterial infection or co-infection with other pathogens not detected by the test. Clinical correlation with patient history and other diagnostic information is necessary to determine patient infection status. The expected result is Negative.  Fact Sheet for Patients: EntrepreneurPulse.com.au  Fact Sheet for Healthcare Providers: IncredibleEmployment.be  This test is not yet approved  or cleared by the [place] FDA and  has been authorized for detection and/or diagnosis of SARS-CoV-2 by FDA under an Emergency Use Authorization (EUA).  This EUA will remain in effect (meaning this test c an be used) for the duration of  the COVID-19 declaration under Section 564(b)(1) of the Act, 21 U.S.C. section 360bbb-3(b)(1), unless the authorization is terminated or revoked sooner.     Influenza A by PCR NEGATIVE NEGATIVE Final   Influenza B by PCR NEGATIVE NEGATIVE Final    Comment: (NOTE) The Xpert Xpress SARS-CoV-2/FLU/RSV plus assay is intended as an aid in the diagnosis of influenza from Nasopharyngeal swab specimens and should not be used as a sole basis for treatment. Nasal washings and aspirates are unacceptable for Xpert Xpress SARS-CoV-2/FLU/RSV testing.  Fact Sheet for Patients: EntrepreneurPulse.com.au  Fact Sheet for Healthcare Providers: IncredibleEmployment.be  This test is not yet approved or cleared by the [place] FDA and has been authorized for detection and/or diagnosis of SARS-CoV-2 by FDA under an Emergency Use Authorization (EUA). This EUA will remain in effect (meaning this test can be used) for the duration of the COVID-19 declaration under Section 564(b)(1) of the Act, 21 U.S.C. section 360bbb-3(b)(1), unless the authorization is terminated or revoked.  Performed at [facility], [address], [place], [place] [address]   Culture, blood (Routine X 2) w Reflex to ID Panel     Status: None (Preliminary result)   Collection Time: 06/25/20  1:45 PM   Specimen: BLOOD LEFT HAND  Result Value Ref Range Status   Specimen Description   Final    BLOOD LEFT HAND BOTTLES DRAWN AEROBIC AND ANAEROBIC   Special Requests Blood Culture adequate volume  Final   Culture   Final    NO GROWTH 3 DAYS Performed at [facility], [address], [place], [place] [address]    Report Status PENDING  Incomplete  Culture,  blood (Routine X 2) w Reflex to ID Panel     Status: None (Preliminary result)   Collection Time: 06/25/20  1:45 PM   Specimen: BLOOD LEFT ARM  Result Value Ref Range Status   Specimen Description BLOOD LEFT ARM BOTTLES DRAWN AEROBIC AND ANAEROBIC  Final   Special Requests Blood Culture adequate volume  Final   Culture   Final    NO GROWTH 3 DAYS Performed at [facility], [address] [place], [place] [address]    Report Status PENDING  Incomplete  MRSA PCR Screening     Status: None   Collection Time: 06/25/20  7:44 PM   Specimen: Nasal Mucosa; Nasopharyngeal  Result Value Ref Range Status   MRSA by PCR NEGATIVE NEGATIVE Final    Comment:        The GeneXpert MRSA Assay (FDA approved for NASAL specimens only), is one component of a comprehensive MRSA colonization surveillance program. It is not intended to diagnose MRSA infection nor to guide or monitor treatment for MRSA infections. Performed at [facility], [address], [place], [place] [address]  Scheduled Meds: . vitamin C  500 mg Oral Daily  . [START ON 06/29/2020] aspirin  81 mg Oral Daily  . Chlorhexidine Gluconate Cloth  6 each Topical Daily  . fluticasone  2 spray Each Nare Daily  . [START ON 06/29/2020] pantoprazole  40 mg Intravenous Q12H  . sucralfate  1 g Oral TID WC & HS  . zinc sulfate  220 mg Oral Daily   Continuous Infusions: . sodium chloride 50 mL/hr at 06/28/20 1539    Procedures/Studies: DG CHEST PORT 1 VIEW  Result Date: 06/25/2020 CLINICAL DATA:  COVID 19 positive. EXAM: PORTABLE CHEST 1 VIEW COMPARISON:  CT chest dated January 18, 2020. Chest x-ray dated August 29, 2017. FINDINGS: Unchanged mild cardiomegaly status post CABG. Normal pulmonary vascularity. Subtle reticulonodular opacities at the lung bases. No focal consolidation, pleural effusion, or pneumothorax. No acute osseous abnormality. Unchanged large hiatal hernia. IMPRESSION: 1. Subtle reticulonodular opacities at the lung bases,  nonspecific, but could reflect atypical infection/viral pneumonia. Electronically Signed   By: Titus Dubin M.D.   On: 06/25/2020 14:06   ECHOCARDIOGRAM COMPLETE  Result Date: 06/26/2020    ECHOCARDIOGRAM REPORT   Patient Name:   Jon Reid Date of Exam: 06/26/2020 Medical Rec #:  [id number]      Height:       72.0 in Accession #:    [id number]     Weight:       153.2 lb Date of Birth:  26-Sep-1928      BSA:          1.902 m Patient Age:    42 years       BP:           106/45 mmHg Patient Gender: M              HR:           52 bpm. Exam Location:  [facility] Procedure: 2D Echo, Cardiac Doppler and Color Doppler Indications:    NSTEMI  History:        Patient has prior history of Echocardiogram examinations, most                 recent 02/09/2019. CAD, Prior CABG, Stroke, Aortic Valve Disease                 and Moderate aortic stenosis, Arrythmias:RBBB;                 Signs/Symptoms:Shortness of Breath and Chest Pain. COVID+, CKD.  Sonographer:    Dustin Flock RDCS Referring Phys: [phone] Winefred Hillesheim IMPRESSIONS  1. Distal septal hyokinesis. Left ventricular ejection fraction, by estimation, is 50 to 55%. The left ventricle has low normal function. The left ventricle demonstrates regional wall motion abnormalities (see scoring diagram/findings for description). There is mild left ventricular hypertrophy. Left ventricular diastolic parameters are indeterminate.  2. Right ventricular systolic function is normal. The right ventricular size is normal. There is mildly elevated pulmonary artery systolic pressure.  3. Left atrial size was moderately dilated.  4. The mitral valve is abnormal. Mild mitral valve regurgitation. No evidence of mitral stenosis. Moderate mitral annular calcification.  5. The aortic valve is tricuspid. There is moderate calcification of the aortic valve. There is moderate thickening of the aortic valve. Aortic valve regurgitation is mild. Moderate aortic valve stenosis.  6. The inferior  vena cava is normal in size with greater than 50% respiratory variability, suggesting right atrial pressure of 3 mmHg. FINDINGS  Left  Ventricle: Distal septal hyokinesis. Left ventricular ejection fraction, by estimation, is 50 to 55%. The left ventricle has low normal function. The left ventricle demonstrates regional wall motion abnormalities. The left ventricular internal cavity size was normal in size. There is mild left ventricular hypertrophy. Left ventricular diastolic parameters are indeterminate. Right Ventricle: The right ventricular size is normal. No increase in right ventricular wall thickness. Right ventricular systolic function is normal. There is mildly elevated pulmonary artery systolic pressure. The tricuspid regurgitant velocity is 3.00  m/s, and with an assumed right atrial pressure of 3 mmHg, the estimated right ventricular systolic pressure is 65.6 mmHg. Left Atrium: Left atrial size was moderately dilated. Right Atrium: Right atrial size was normal in size. Pericardium: There is no evidence of pericardial effusion. Mitral Valve: The mitral valve is abnormal. There is mild thickening of the mitral valve leaflet(s). There is mild calcification of the mitral valve leaflet(s). Moderate mitral annular calcification. Mild mitral valve regurgitation. No evidence of mitral  valve stenosis. Tricuspid Valve: The tricuspid valve is normal in structure. Tricuspid valve regurgitation is mild . No evidence of tricuspid stenosis. Aortic Valve: The aortic valve is tricuspid. There is moderate calcification of the aortic valve. There is moderate thickening of the aortic valve. There is moderate aortic valve annular calcification. Aortic valve regurgitation is mild. Moderate aortic stenosis is present. Aortic valve mean gradient measures 20.5 mmHg. Aortic valve peak gradient measures 36.8 mmHg. Aortic valve area, by VTI measures 1.20 cm. Pulmonic Valve: The pulmonic valve was normal in structure. Pulmonic  valve regurgitation is not visualized. No evidence of pulmonic stenosis. Aorta: The aortic root is normal in size and structure. Venous: The inferior vena cava is normal in size with greater than 50% respiratory variability, suggesting right atrial pressure of 3 mmHg. IAS/Shunts: The interatrial septum was not well visualized.  LEFT VENTRICLE PLAX 2D LVIDd:         4.14 cm  Diastology LVIDs:         2.65 cm  LV e' medial:    4.90 cm/s LV PW:         1.15 cm  LV E/e' medial:  21.0 LV IVS:        1.19 cm  LV e' lateral:   8.49 cm/s LVOT diam:     2.20 cm  LV E/e' lateral: 12.1 LV SV:         93 LV SV Index:   49 LVOT Area:     3.80 cm  RIGHT VENTRICLE RV Basal diam:  3.73 cm RV S prime:     8.27 cm/s TAPSE (M-mode): 2.4 cm LEFT ATRIUM              Index       RIGHT ATRIUM           Index LA diam:        4.70 cm  2.47 cm/m  RA Area:     21.60 cm LA Vol (A2C):   71.6 ml  37.64 ml/m RA Volume:   64.90 ml  34.11 ml/m LA Vol (A4C):   110.0 ml 57.82 ml/m LA Biplane Vol: 95.9 ml  50.41 ml/m  AORTIC VALVE AV Area (Vmax):    1.13 cm AV Area (Vmean):   1.13 cm AV Area (VTI):     1.20 cm AV Vmax:           303.33 cm/s AV Vmean:          214.500 cm/s AV VTI:  0.772 m AV Peak Grad:      36.8 mmHg AV Mean Grad:      20.5 mmHg LVOT Vmax:         90.10 cm/s LVOT Vmean:        63.900 cm/s LVOT VTI:          0.244 m LVOT/AV VTI ratio: 0.32  AORTA Ao Root diam: 3.10 cm MITRAL VALVE                TRICUSPID VALVE MV Area (PHT): 4.04 cm     TR Peak grad:   36.0 mmHg MV Decel Time: 188 msec     TR Vmax:        300.00 cm/s MV E velocity: 103.00 cm/s MV A velocity: 60.80 cm/s   SHUNTS MV E/A ratio:  1.69         Systemic VTI:  0.24 m                             Systemic Diam: 2.20 cm Jenkins Rouge MD Electronically signed by Jenkins Rouge MD Signature Date/Time: 06/26/2020/10:39:59 AM    Final     Orson Eva, DO  [facility]  If 7PM-7AM, please contact night-coverage [email or web address] Password [facility] 06/28/2020,  5:34 PM   LOS: 3 days

## 2020-06-28 NOTE — Progress Notes (Signed)
Subjective: No complaints.  Denies abdominal pain, nausea, or vomiting.  He did have a small bowel movement this morning that was brown in color.   Objective: Vital signs in last 24 hours: Temp:  [98.1 F (36.7 C)-98.8 F (37.1 C)] 98.1 F (36.7 C) (04/12 0400) Pulse Rate:  [54-68] 54 (04/12 0600) Resp:  [13-22] 17 (04/12 0600) BP: (102-151)/(36-59) 142/43 (04/12 0600) SpO2:  [97 %-100 %] 98 % (04/12 0600) Weight:  [71.5 kg] 71.5 kg (04/12 0418) Last BM Date: 06/25/20 General:   Alert and oriented, pleasant Head:  Normocephalic and atraumatic. Abdomen:  Bowel sounds present, soft, non-tender, non-distended. No rebound or guarding. No masses appreciated  Msk:  Symmetrical without gross deformities. Normal posture. Extremities:  Without edema. Neurologic:  Alert and  oriented x4;  grossly normal neurologically. Psych:  Normal mood and affect.  Intake/Output from previous day: 04/11 0701 - 04/12 0700 In: 2205.6 [I.V.:1911.5; IV Piggyback:294.1] Out: 8588 [Urine:1375] Intake/Output this shift: No intake/output data recorded.  Lab Results: Recent Labs    06/26/20 0600 06/27/20 0435 06/28/20 0428  WBC 10.3 7.8 8.6  HGB 7.6* 8.0* 8.6*  HCT 23.9* 24.3* 26.4*  PLT 175 155 176   BMET Recent Labs    06/26/20 0600 06/27/20 0435 06/28/20 0428  NA 137 140 140  K 3.6 3.3* 3.4*  CL 107 111 110  CO2 25 21* 21*  GLUCOSE 99 99 101*  BUN 50* 40* 27*  CREATININE 1.27* 1.19 1.22  CALCIUM 8.3* 8.1* 7.8*   LFT Recent Labs    06/25/20 1142 06/26/20 0600 06/27/20 0435 06/28/20 0428  PROT 5.8*  5.8* 5.3* 4.6* 4.6*  ALBUMIN 3.7  3.7 3.3* 2.8* 2.8*  AST 27  27 35 30 27  ALT 18  19 17 17 20   ALKPHOS 43  42 33* 37* 42  BILITOT 0.9  0.8 1.3* 1.0 1.2  BILIDIR 0.1  --   --   --   IBILI 0.8  --   --   --     Assessment: 87 year old male who presented to the emergency room 06/25/2020 with reported coffee-ground emesis and melena in the setting of Celebrex daily.  Found  to have hemoglobin 6.1 with stool heme positive.  He was also found to be Covid positive with no significant respiratory symptoms, on remdesivir.  Upper GI bleed with acute on chronic anemia: Presented with melena and coffee ground emesis. Underwent EGD 4/11 revealing large hiatal hernia, LA grade D esophagitis with no bleeding, normal examined duodenum. Received 3 units PRBCs with last transfusion on 4/10. Hemoglobin stable/improved at 8.6 today. He is tolerating a clear liquid diet without abdominal pain, nausea, vomiting, or overt GI bleeding. He will be completing PPI infusion today. He will need to continue on PPI BID as well as Carafate QID and repeat EGD in 8 weeks. Will advance to full liquids for lunch and if he does well, advance to soft diet at dinner.    Anemia: Hemoglobin is stable today at 8.6. He was found to have iron deficiency as well as low normal vitamin B 12. He received vitamin B 12 this admission. He takes oral iron every other day at home. Advised that he will need to increase this to daily for now.   Plan: 1.  Advance to full liquids for lunch, and if he does well, advance to soft diet for dinner. 2.  Complete PPI infusion today. 3.  Start Protonix 40 mg twice daily after infusion is complete.  4.  Continue Carafate 3 times daily before meals and at bedtime. 5.  Patient will need to start ferrous sulfate 325 mg daily at discharge. 6.  Avoid NSAIDs.  7.  He will need repeat EGD in 8 weeks. 8.  We have outpatient follow-up arranged with [facility] on 08/19/20.    LOS: 3 days    06/28/2020, 10:26 AM   Aliene Altes, PA-C [facility]

## 2020-06-28 NOTE — Progress Notes (Signed)
    Dr Kyla Balzarine consult reviewed. Patient with elevated troponin in setting of GI bleed requiring transfusion and COVID +. Not a cath candidate nor a candidate at this time. No anticoag or antiplatelet therapy currently due to GI bleed, when cleared by GI could start aspirin. Can consider outpatient lexiscan for risk stratification.   No additional cardiology recs at this time, we will sign off inpatient care.       [name], MD  06/28/2020, 8:15 AM

## 2020-06-29 DIAGNOSIS — K2101 Gastro-esophageal reflux disease with esophagitis, with bleeding: Secondary | ICD-10-CM

## 2020-06-29 LAB — COMPREHENSIVE METABOLIC PANEL
ALT: 27 U/L (ref 0–44)
AST: 28 U/L (ref 15–41)
Albumin: 3.1 g/dL — ABNORMAL LOW (ref 3.5–5.0)
Alkaline Phosphatase: 51 U/L (ref 38–126)
Anion gap: 10 (ref 5–15)
BUN: 19 mg/dL (ref 8–23)
CO2: 21 mmol/L — ABNORMAL LOW (ref 22–32)
Calcium: 8.1 mg/dL — ABNORMAL LOW (ref 8.9–10.3)
Chloride: 109 mmol/L (ref 98–111)
Creatinine, Ser: 1.17 mg/dL (ref 0.61–1.24)
GFR, Estimated: 60 mL/min — ABNORMAL LOW (ref >60)
Glucose, Bld: 112 mg/dL — ABNORMAL HIGH (ref 70–99)
Potassium: 3.2 mmol/L — ABNORMAL LOW (ref 3.5–5.1)
Sodium: 140 mmol/L (ref 135–145)
Total Bilirubin: 1.2 mg/dL (ref 0.3–1.2)
Total Protein: 5.1 g/dL — ABNORMAL LOW (ref 6.5–8.1)

## 2020-06-29 LAB — CBC WITH DIFFERENTIAL/PLATELET
Abs Immature Granulocytes: 0.04 10*3/uL (ref 0.00–0.07)
Basophils Absolute: 0 10*3/uL (ref 0.0–0.1)
Basophils Relative: 0 %
Eosinophils Absolute: 0.3 10*3/uL (ref 0.0–0.5)
Eosinophils Relative: 3 %
HCT: 30.1 % — ABNORMAL LOW (ref 39.0–52.0)
Hemoglobin: 9.6 g/dL — ABNORMAL LOW (ref 13.0–17.0)
Immature Granulocytes: 0 %
Lymphocytes Relative: 17 %
Lymphs Abs: 1.6 10*3/uL (ref 0.7–4.0)
MCH: 31.1 pg (ref 26.0–34.0)
MCHC: 31.9 g/dL (ref 30.0–36.0)
MCV: 97.4 fL (ref 80.0–100.0)
Monocytes Absolute: 0.9 10*3/uL (ref 0.1–1.0)
Monocytes Relative: 10 %
Neutro Abs: 6.3 10*3/uL (ref 1.7–7.7)
Neutrophils Relative %: 70 %
Platelets: 214 10*3/uL (ref 150–400)
RBC: 3.09 MIL/uL — ABNORMAL LOW (ref 4.22–5.81)
RDW: 15.7 % — ABNORMAL HIGH (ref 11.5–15.5)
WBC: 9.2 10*3/uL (ref 4.0–10.5)
nRBC: 0 % (ref 0.0–0.2)

## 2020-06-29 LAB — FERRITIN: Ferritin: 21 ng/mL — ABNORMAL LOW (ref 24–336)

## 2020-06-29 LAB — D-DIMER, QUANTITATIVE: D-Dimer, Quant: 2 ug/mL-FEU — ABNORMAL HIGH (ref 0.00–0.50)

## 2020-06-29 LAB — C-REACTIVE PROTEIN: CRP: 2.8 mg/dL — ABNORMAL HIGH (ref <1.0)

## 2020-06-29 MED ORDER — IRON (FERROUS SULFATE) 325 (65 FE) MG PO TABS: 1.0000 | ORAL_TABLET | Freq: Every day | ORAL | 3 refills | Status: DC

## 2020-06-29 MED ORDER — ZINC SULFATE 220 (50 ZN) MG PO CAPS: 220.0000 mg | ORAL_CAPSULE | Freq: Every day | ORAL | 1 refills | Status: DC

## 2020-06-29 MED ORDER — ASCORBIC ACID 500 MG PO TABS: 500.0000 mg | ORAL_TABLET | Freq: Every day | ORAL | 1 refills | Status: AC

## 2020-06-29 MED ORDER — TRIAMCINOLONE ACETONIDE 0.1 % EX CREA: 1.0000 "application " | TOPICAL_CREAM | Freq: Every day | CUTANEOUS | Status: DC | PRN

## 2020-06-29 MED ORDER — PANTOPRAZOLE SODIUM 40 MG PO TBEC: 40.0000 mg | DELAYED_RELEASE_TABLET | Freq: Two times a day (BID) | ORAL | 2 refills | Status: DC

## 2020-06-29 MED ORDER — FUROSEMIDE 20 MG PO TABS: 20.0000 mg | ORAL_TABLET | Freq: Every day | ORAL | 2 refills | Status: DC

## 2020-06-29 MED ORDER — POTASSIUM CHLORIDE CRYS ER 20 MEQ PO TBCR
40.0000 meq | EXTENDED_RELEASE_TABLET | Freq: Once | ORAL | Status: AC
  Administered 2020-06-29: 40 meq via ORAL
  Filled 2020-06-29: qty 2

## 2020-06-29 NOTE — Plan of Care (Signed)

## 2020-06-29 NOTE — Plan of Care (Signed)
  Problem: Education: Goal: Knowledge of General Education information will improve Description: Including pain rating scale, medication(s)/side effects and non-pharmacologic comfort measures 06/29/2020 1531 by Melony Overly, RN Outcome: Adequate for Discharge 06/29/2020 1231 by Melony Overly, RN Outcome: Progressing   Problem: Health Behavior/Discharge Planning: Goal: Ability to manage health-related needs will improve 06/29/2020 1531 by Melony Overly, RN Outcome: Adequate for Discharge 06/29/2020 1231 by Melony Overly, RN Outcome: Progressing   Problem: Clinical Measurements: Goal: Ability to maintain clinical measurements within normal limits will improve 06/29/2020 1531 by Melony Overly, RN Outcome: Adequate for Discharge 06/29/2020 1231 by Melony Overly, RN Outcome: Progressing Goal: Will remain free from infection 06/29/2020 1531 by Melony Overly, RN Outcome: Adequate for Discharge 06/29/2020 1231 by Melony Overly, RN Outcome: Progressing Goal: Diagnostic test results will improve 06/29/2020 1531 by Melony Overly, RN Outcome: Adequate for Discharge 06/29/2020 1231 by Melony Overly, RN Outcome: Progressing Goal: Respiratory complications will improve 06/29/2020 1531 by Melony Overly, RN Outcome: Adequate for Discharge 06/29/2020 1231 by Melony Overly, RN Outcome: Progressing Goal: Cardiovascular complication will be avoided 06/29/2020 1531 by Melony Overly, RN Outcome: Adequate for Discharge 06/29/2020 1231 by Melony Overly, RN Outcome: Progressing   Problem: Activity: Goal: Risk for activity intolerance will decrease 06/29/2020 1531 by Melony Overly, RN Outcome: Adequate for Discharge 06/29/2020 1231 by Melony Overly, RN Outcome: Progressing   Problem: Nutrition: Goal: Adequate nutrition will be maintained 06/29/2020 1531 by Melony Overly, RN Outcome: Adequate for Discharge 06/29/2020 1231 by Melony Overly, RN Outcome: Progressing    Problem: Coping: Goal: Level of anxiety will decrease 06/29/2020 1531 by Melony Overly, RN Outcome: Adequate for Discharge 06/29/2020 1231 by Melony Overly, RN Outcome: Progressing   Problem: Elimination: Goal: Will not experience complications related to bowel motility 06/29/2020 1531 by Melony Overly, RN Outcome: Adequate for Discharge 06/29/2020 1231 by Melony Overly, RN Outcome: Progressing Goal: Will not experience complications related to urinary retention 06/29/2020 1531 by Melony Overly, RN Outcome: Adequate for Discharge 06/29/2020 1231 by Melony Overly, RN Outcome: Progressing   Problem: Pain Managment: Goal: General experience of comfort will improve 06/29/2020 1531 by Melony Overly, RN Outcome: Adequate for Discharge 06/29/2020 1231 by Melony Overly, RN Outcome: Progressing   Problem: Safety: Goal: Ability to remain free from injury will improve 06/29/2020 1531 by Melony Overly, RN Outcome: Adequate for Discharge 06/29/2020 1231 by Melony Overly, RN Outcome: Progressing   Problem: Skin Integrity: Goal: Risk for impaired skin integrity will decrease 06/29/2020 1531 by Melony Overly, RN Outcome: Adequate for Discharge 06/29/2020 1231 by Melony Overly, RN Outcome: Progressing

## 2020-06-29 NOTE — Discharge Summary (Signed)
Physician Discharge Summary  Jon Reid DOB: [birth year] DOA: 06/25/2020  PCP: Claretta Fraise, MD  Admit date: 06/25/2020 Discharge date: 06/29/2020  Time spent: 35 minutes  Recommendations for Outpatient Follow-up:  1. Repeat CBC to follow hemoglobin and WBCs trend 2. Repeat basic metabolic panel to follow lites and renal function 3. Outpatient follow-up with gastroenterology service with anticipated repeat endoscopy 4. -Outpatient follow-up with cardiology service for further work-up of articular stenosis and Myoview.   Discharge Diagnoses:  Active Problems:   Chronic renal insufficiency, stage III (moderate) (HCC)   Iron deficiency anemia due to chronic blood loss   Upper GI bleed   COVID-19 virus infection HTN Hx of stroke Acute blood loss anemia  Discharge Condition: Stable and improved.  Discharged home with instruction to follow-up with PCP and gastroenterology as an outpatient.  CODE STATUS: Full code  Diet recommendation: Heart healthy diet.  Filed Weights   06/25/20 1118 06/25/20 1500 06/28/20 0418  Weight: 70 kg 69.5 kg 71.5 kg    History of present illness:  87 y.o.malewith medical history ofknee pain, prostate adenocarcinoma, coronary artery disease, CKD stage III, hypertension, stroke, aortic stenosis, right bundle branch block presenting with nausea, vomiting, and generalized weakness. The patient states that he has had nausea and vomiting for the better part of 2 weeks. His generalized weakness has gradually worsened during this time to the point where he was having difficulty getting out of bed on the morning of 06/25/2020. As result, the patient was brought to the emergency department for further evaluation. He states that his emesis is very dark looking almost coffee-ground like. He denies any bright red blood with emesis. He denies any diarrhea, hematochezia, but admits to having dark/black stools. He has had some subjective fevers and  chills. He states that he has been taking Celebrex for at least 1 month. His dose of Celebrex was recently increased on 06/21/2020. He denies any other over-the-counter NSAIDs. He has had some exertional chest discomfort and shortness of breath over the past week. He denies any coughing or hemoptysis. There is no dysuria or hematuria. He states that he has been vaccinated x3 for COVID-19.  In the emergency department, the patient had a low-grade temperature 99.0 F. He was hemodynamically stable with oxygen saturation of 100% on room air. BMP was essentially unremarkable with serum creatinine of 1.40 which is near his baseline. WBC 15.2, hemoglobin 6.1, platelets 226,000. 2 units PRBC were ordered. GI was consulted to assist with management. FOBT with brown stool, heme +  Hospital Course:  Upper GI bleed/acute blood loss anemia/symptomatic anemia -Avoid the use of NSAIDs and Celebrex. -GI consulted appreciated -Discharged on Protonix twice a day. -plan repeat EGD in 2 months -3units PRBC have beengiventotal -Patient has been discharged on daily iron supplementation. -Diet has been advanced and well-tolerated. -Per GI service recommendations ok to start ASA 81 mg 06/29/20  COVID-19pneumonia -chest x-ray--personally reviewed--scattered interstitial opacities -Patient has completed 3 days of remdesivir. -Stable on room air -Stable inflammatory markers -Continue the use of Vitamin C and zinc daily.  Coronary artery disease/chest pain/Elevated Troponin -[id number]>>[id number] -Obtain EKG--sinus, RBBB -no chest pain since time of arrival -appreciate cardiology consult-->not candidate presently for cath or [facility] -will need outpatient myoview -06/26/20--Echo EF 50-55%, distal septal HK, moderate AS, mild MR, mild AI -Continue risk factor modifications and continue baby aspirin.  CKD stage IIIb -Baseline creatinine 1.4-1.6 -Has remained stable and at baseline -Repeat basic  metabolic panel at follow-up visit to reassess renal  function and stability.  Leukocytosis -Check PCT<0.10 -No acute abnormality appreciated on chest x-ray. -UA--no pyuria; patient denies dysuria. -likely stress demargination. -remains afebrile and hemodynamically stable at time of discharge. -Blood cultures x2 sets--neg to date -Repeat CBC to follow WBCs t.  Aortic stenosis -Patient will follow up as an outpatient with cardiology service  History of stroke -No acute deficits -Continue risk factor modification -Safe to resume baby aspirin for secondary prevention.  Procedures:  See below for x-ray reports.  2D echo: 1. Distal septal hyokinesis. Left ventricular ejection fraction, by  estimation, is 50 to 55%. The left ventricle has low normal function. The  left ventricle demonstrates regional wall motion abnormalities (see  scoring diagram/findings for description).  There is mild left ventricular hypertrophy. Left ventricular diastolic  parameters are indeterminate.  2. Right ventricular systolic function is normal. The right ventricular  size is normal. There is mildly elevated pulmonary artery systolic  pressure.  3. Left atrial size was moderately dilated.  4. The mitral valve is abnormal. Mild mitral valve regurgitation. No  evidence of mitral stenosis. Moderate mitral annular calcification.  5. The aortic valve is tricuspid. There is moderate calcification of the  aortic valve. There is moderate thickening of the aortic valve. Aortic  valve regurgitation is mild. Moderate aortic valve stenosis.  6. The inferior vena cava is normal in size with greater than 50%  respiratory variability, suggesting right atrial pressure of 3 mmHg.   Consultations:  GI service  Cardiology service  Discharge Exam: Vitals:   06/29/20 0355 06/29/20 1429  BP: 122/60 122/65  Pulse: (!) 59 67  Resp: 19 18  Temp: (!) 97.4 F (36.3 C) 98.3 F (36.8 C)  SpO2: 98% 100%     General: Afebrile, no chest pain, no nausea, no vomiting, no shortness of breath.  Patient feels ready to go home.  No further signs of acute GI bleed unable to tolerate diet without problems. Cardiovascular: S1 and S2, no rubs, no gallops, no JVD.  Positive systolic ejection murmur. Respiratory: Clear to auscultation bilaterally; no using accessory muscles. Abdomen: Soft, nontender, distended, positive bowel sounds Extremities: No cyanosis or clubbing.  Discharge Instructions   Discharge Instructions    Diet - low sodium heart healthy   Complete by: As directed    Discharge instructions   Complete by: As directed    -Follow-up with PCP in 2 weeks Follow-up with gastroenterology service as instructed Maintain adequate hydration Take medications as prescribed Avoid the use of NSAIDs (Celebrex, ibuprofen, advil, diclofenac, aleve, naproxen, indomethacin, motrin, goody powder, etc...).   No wound care   Complete by: As directed      Allergies as of 06/29/2020      Reactions   Crestor [rosuvastatin] Other (See Comments)   GYNECOMASTIA AND SWELLING      Medication List    STOP taking these medications   celecoxib 400 MG capsule Commonly known as: CeleBREX   diclofenac sodium 1 % Gel Commonly known as: VOLTAREN   predniSONE 10 MG tablet Commonly known as: DELTASONE     TAKE these medications   ascorbic acid 500 MG tablet Commonly known as: VITAMIN C Take 1 tablet (500 mg total) by mouth daily. Start taking on: June 30, 2020   aspirin EC 81 MG tablet Take 81 mg by mouth daily.   carbidopa-levodopa 25-100 MG tablet Commonly known as: SINEMET IR 1/2 tablet three times a day for 4 weeks, then take one tablet three times a day  fluticasone 50 MCG/ACT nasal spray Commonly known as: FLONASE One to 2 sprays each nostril at bedtime What changed:   how much to take  how to take this  when to take this  additional instructions   furosemide 20 MG  tablet Commonly known as: LASIX Take 1 tablet (20 mg total) by mouth daily. What changed:   medication strength  how much to take   Iron (Ferrous Sulfate) 325 (65 Fe) MG Tabs Take 1 tablet by mouth daily. What changed: when to take this   nitroGLYCERIN 0.4 MG SL tablet Commonly known as: NITROSTAT Place 1 tablet (0.4 mg total) under the tongue every 5 (five) minutes as needed for chest pain.   pantoprazole 40 MG tablet Commonly known as: PROTONIX Take 1 tablet (40 mg total) by mouth 2 (two) times daily. TAKE 1 TABLET ONCE DAILY FOR REFLUX What changed:   how much to take  how to take this  when to take this   triamcinolone cream 0.1 % Commonly known as: KENALOG Apply 1 application topically daily as needed (itching).   Vitamin D 50 MCG (2000 UT) Caps Take 2,000 Units by mouth daily.   zinc sulfate 220 (50 Zn) MG capsule Take 1 capsule (220 mg total) by mouth daily. Start taking on: June 30, 2020      Allergies  Allergen Reactions  . Crestor [Rosuvastatin] Other (See Comments)    GYNECOMASTIA AND SWELLING    Follow-up Information    Imogene Burn, PA-C Follow up on 07/25/2020.   Specialty: Cardiology Why: [facility]iology Hospital Follow-up on 07/25/2020 at 11:30 at [facility]. Please call the office if needing a different date or time.  Contact information: [place] [address] [phone]        [facility] Follow up on 08/19/2020.   Why: 0800 AM Contact information: [address] [place] [address] [phone]       Claretta Fraise, MD. Schedule an appointment as soon as possible for a visit in 2 week(s).   Specialty: Family Medicine Contact information: [place] [address] [phone]        Minus Breeding, MD .   Specialty: Cardiology Contact information: [address] [place] [address] [phone]               The results of  significant diagnostics from this hospitalization (including imaging, microbiology, ancillary and laboratory) are listed below for reference.    Significant Diagnostic Studies: DG CHEST PORT 1 VIEW  Result Date: 06/25/2020 CLINICAL DATA:  COVID 19 positive. EXAM: PORTABLE CHEST 1 VIEW COMPARISON:  CT chest dated January 18, 2020. Chest x-ray dated August 29, 2017. FINDINGS: Unchanged mild cardiomegaly status post CABG. Normal pulmonary vascularity. Subtle reticulonodular opacities at the lung bases. No focal consolidation, pleural effusion, or pneumothorax. No acute osseous abnormality. Unchanged large hiatal hernia. IMPRESSION: 1. Subtle reticulonodular opacities at the lung bases, nonspecific, but could reflect atypical infection/viral pneumonia. Electronically Signed   By: Titus Dubin M.D.   On: 06/25/2020 14:06   ECHOCARDIOGRAM COMPLETE  Result Date: 06/26/2020    ECHOCARDIOGRAM REPORT   Patient Name:   TRENT GABLER Date of Exam: 06/26/2020 Medical Rec #:  [id number]      Height:       72.0 in Accession #:    [id number]     Weight:       153.2 lb Date of Birth:  07-09-1928  BSA:          1.902 m Patient Age:    87 years       BP:           106/45 mmHg Patient Gender: M              HR:           52 bpm. Exam Location:  [facility] Procedure: 2D Echo, Cardiac Doppler and Color Doppler Indications:    NSTEMI  History:        Patient has prior history of Echocardiogram examinations, most                 recent 02/09/2019. CAD, Prior CABG, Stroke, Aortic Valve Disease                 and Moderate aortic stenosis, Arrythmias:RBBB;                 Signs/Symptoms:Shortness of Breath and Chest Pain. COVID+, CKD.  Sonographer:    Dustin Flock RDCS Referring Phys: [phone] DAVID TAT IMPRESSIONS  1. Distal septal hyokinesis. Left ventricular ejection fraction, by estimation, is 50 to 55%. The left ventricle has low normal function. The left ventricle demonstrates regional wall motion abnormalities (see  scoring diagram/findings for description). There is mild left ventricular hypertrophy. Left ventricular diastolic parameters are indeterminate.  2. Right ventricular systolic function is normal. The right ventricular size is normal. There is mildly elevated pulmonary artery systolic pressure.  3. Left atrial size was moderately dilated.  4. The mitral valve is abnormal. Mild mitral valve regurgitation. No evidence of mitral stenosis. Moderate mitral annular calcification.  5. The aortic valve is tricuspid. There is moderate calcification of the aortic valve. There is moderate thickening of the aortic valve. Aortic valve regurgitation is mild. Moderate aortic valve stenosis.  6. The inferior vena cava is normal in size with greater than 50% respiratory variability, suggesting right atrial pressure of 3 mmHg. FINDINGS  Left Ventricle: Distal septal hyokinesis. Left ventricular ejection fraction, by estimation, is 50 to 55%. The left ventricle has low normal function. The left ventricle demonstrates regional wall motion abnormalities. The left ventricular internal cavity size was normal in size. There is mild left ventricular hypertrophy. Left ventricular diastolic parameters are indeterminate. Right Ventricle: The right ventricular size is normal. No increase in right ventricular wall thickness. Right ventricular systolic function is normal. There is mildly elevated pulmonary artery systolic pressure. The tricuspid regurgitant velocity is 3.00  m/s, and with an assumed right atrial pressure of 3 mmHg, the estimated right ventricular systolic pressure is 03.5 mmHg. Left Atrium: Left atrial size was moderately dilated. Right Atrium: Right atrial size was normal in size. Pericardium: There is no evidence of pericardial effusion. Mitral Valve: The mitral valve is abnormal. There is mild thickening of the mitral valve leaflet(s). There is mild calcification of the mitral valve leaflet(s). Moderate mitral annular  calcification. Mild mitral valve regurgitation. No evidence of mitral  valve stenosis. Tricuspid Valve: The tricuspid valve is normal in structure. Tricuspid valve regurgitation is mild . No evidence of tricuspid stenosis. Aortic Valve: The aortic valve is tricuspid. There is moderate calcification of the aortic valve. There is moderate thickening of the aortic valve. There is moderate aortic valve annular calcification. Aortic valve regurgitation is mild. Moderate aortic stenosis is present. Aortic valve mean gradient measures 20.5 mmHg. Aortic valve peak gradient measures 36.8 mmHg. Aortic valve area, by VTI measures 1.20 cm. Pulmonic Valve: The  pulmonic valve was normal in structure. Pulmonic valve regurgitation is not visualized. No evidence of pulmonic stenosis. Aorta: The aortic root is normal in size and structure. Venous: The inferior vena cava is normal in size with greater than 50% respiratory variability, suggesting right atrial pressure of 3 mmHg. IAS/Shunts: The interatrial septum was not well visualized.  LEFT VENTRICLE PLAX 2D LVIDd:         4.14 cm  Diastology LVIDs:         2.65 cm  LV e' medial:    4.90 cm/s LV PW:         1.15 cm  LV E/e' medial:  21.0 LV IVS:        1.19 cm  LV e' lateral:   8.49 cm/s LVOT diam:     2.20 cm  LV E/e' lateral: 12.1 LV SV:         93 LV SV Index:   49 LVOT Area:     3.80 cm  RIGHT VENTRICLE RV Basal diam:  3.73 cm RV S prime:     8.27 cm/s TAPSE (M-mode): 2.4 cm LEFT ATRIUM              Index       RIGHT ATRIUM           Index LA diam:        4.70 cm  2.47 cm/m  RA Area:     21.60 cm LA Vol (A2C):   71.6 ml  37.64 ml/m RA Volume:   64.90 ml  34.11 ml/m LA Vol (A4C):   110.0 ml 57.82 ml/m LA Biplane Vol: 95.9 ml  50.41 ml/m  AORTIC VALVE AV Area (Vmax):    1.13 cm AV Area (Vmean):   1.13 cm AV Area (VTI):     1.20 cm AV Vmax:           303.33 cm/s AV Vmean:          214.500 cm/s AV VTI:            0.772 m AV Peak Grad:      36.8 mmHg AV Mean Grad:       20.5 mmHg LVOT Vmax:         90.10 cm/s LVOT Vmean:        63.900 cm/s LVOT VTI:          0.244 m LVOT/AV VTI ratio: 0.32  AORTA Ao Root diam: 3.10 cm MITRAL VALVE                TRICUSPID VALVE MV Area (PHT): 4.04 cm     TR Peak grad:   36.0 mmHg MV Decel Time: 188 msec     TR Vmax:        300.00 cm/s MV E velocity: 103.00 cm/s MV A velocity: 60.80 cm/s   SHUNTS MV E/A ratio:  1.69         Systemic VTI:  0.24 m                             Systemic Diam: 2.20 cm Jenkins Rouge MD Electronically signed by Jenkins Rouge MD Signature Date/Time: 06/26/2020/10:39:59 AM    Final     Microbiology: Recent Results (from the past 240 hour(s))  Resp Panel by RT-PCR (Flu A&B, Covid) Nasopharyngeal Swab     Status: Abnormal   Collection Time: 06/25/20 11:42 AM   Specimen: Nasopharyngeal Swab; Nasopharyngeal(NP) swabs  in vial transport medium  Result Value Ref Range Status   SARS Coronavirus 2 by RT PCR POSITIVE (A) NEGATIVE Final    Comment: RESULT CALLED TO, READ BACK BY AND VERIFIED WITH: LONG L.@ 1252 ON [id number] BY HENDERSON L (NOTE) SARS-CoV-2 target nucleic acids are DETECTED.  The SARS-CoV-2 RNA is generally detectable in upper respiratory specimens during the acute phase of infection. Positive results are indicative of the presence of the identified virus, but do not rule out bacterial infection or co-infection with other pathogens not detected by the test. Clinical correlation with patient history and other diagnostic information is necessary to determine patient infection status. The expected result is Negative.  Fact Sheet for Patients: EntrepreneurPulse.com.au  Fact Sheet for Healthcare Providers: IncredibleEmployment.be  This test is not yet approved or cleared by the [place] FDA and  has been authorized for detection and/or diagnosis of SARS-CoV-2 by FDA under an Emergency Use Authorization (EUA).  This EUA will remain in effect (meaning this test  c an be used) for the duration of  the COVID-19 declaration under Section 564(b)(1) of the Act, 21 U.S.C. section 360bbb-3(b)(1), unless the authorization is terminated or revoked sooner.     Influenza A by PCR NEGATIVE NEGATIVE Final   Influenza B by PCR NEGATIVE NEGATIVE Final    Comment: (NOTE) The Xpert Xpress SARS-CoV-2/FLU/RSV plus assay is intended as an aid in the diagnosis of influenza from Nasopharyngeal swab specimens and should not be used as a sole basis for treatment. Nasal washings and aspirates are unacceptable for Xpert Xpress SARS-CoV-2/FLU/RSV testing.  Fact Sheet for Patients: EntrepreneurPulse.com.au  Fact Sheet for Healthcare Providers: IncredibleEmployment.be  This test is not yet approved or cleared by the [place] FDA and has been authorized for detection and/or diagnosis of SARS-CoV-2 by FDA under an Emergency Use Authorization (EUA). This EUA will remain in effect (meaning this test can be used) for the duration of the COVID-19 declaration under Section 564(b)(1) of the Act, 21 U.S.C. section 360bbb-3(b)(1), unless the authorization is terminated or revoked.  Performed at [facility], [address], [place], [place] [address]   Culture, blood (Routine X 2) w Reflex to ID Panel     Status: None (Preliminary result)   Collection Time: 06/25/20  1:45 PM   Specimen: BLOOD LEFT HAND  Result Value Ref Range Status   Specimen Description   Final    BLOOD LEFT HAND BOTTLES DRAWN AEROBIC AND ANAEROBIC   Special Requests Blood Culture adequate volume  Final   Culture   Final    NO GROWTH 4 DAYS Performed at [facility], [address] [place],  [address]    Report Status PENDING  Incomplete  Culture, blood (Routine X 2) w Reflex to ID Panel     Status: None (Preliminary result)   Collection Time: 06/25/20  1:45 PM   Specimen: BLOOD LEFT ARM  Result Value Ref Range Status   Specimen Description BLOOD LEFT  ARM BOTTLES DRAWN AEROBIC AND ANAEROBIC  Final   Special Requests Blood Culture adequate volume  Final   Culture   Final    NO GROWTH 4 DAYS Performed at [facility], [address], [place],  [address]    Report Status PENDING  Incomplete  MRSA PCR Screening     Status: None   Collection Time: 06/25/20  7:44 PM   Specimen: Nasal Mucosa; Nasopharyngeal  Result Value Ref Range Status   MRSA by PCR NEGATIVE NEGATIVE Final  Comment:        The GeneXpert MRSA Assay (FDA approved for NASAL specimens only), is one component of a comprehensive MRSA colonization surveillance program. It is not intended to diagnose MRSA infection nor to guide or monitor treatment for MRSA infections. Performed at [facility], [address] [place], [place] [address]      Labs: Basic Metabolic Panel: Recent Labs  Lab 06/25/20 1142 06/26/20 0600 06/27/20 0435 06/28/20 0428 06/29/20 0647  NA 138 137 140 140 140  K 4.0 3.6 3.3* 3.4* 3.2*  CL 105 107 111 110 109  CO2 23 25 21* 21* 21*  GLUCOSE 124* 99 99 101* 112*  BUN 68* 50* 40* 27* 19  CREATININE 1.40* 1.27* 1.19 1.22 1.17  CALCIUM 9.5 8.3* 8.1* 7.8* 8.1*  MG  --   --  1.9  --   --    Liver Function Tests: Recent Labs  Lab 06/25/20 1142 06/26/20 0600 06/27/20 0435 06/28/20 0428 06/29/20 0647  AST 27  27 35 30 27 28   ALT 18  19 17 17 20 27   ALKPHOS 43  42 33* 37* 42 51  BILITOT 0.9  0.8 1.3* 1.0 1.2 1.2  PROT 5.8*  5.8* 5.3* 4.6* 4.6* 5.1*  ALBUMIN 3.7  3.7 3.3* 2.8* 2.8* 3.1*   Recent Labs  Lab 06/25/20 1142  LIPASE 24   CBC: Recent Labs  Lab 06/25/20 1142 06/26/20 0600 06/27/20 0435 06/28/20 0428 06/29/20 0647  WBC 15.2* 10.3 7.8 8.6 9.2  NEUTROABS 12.7* 7.1 5.3 5.8 6.3  HGB 6.1* 7.6* 8.0* 8.6* 9.6*  HCT 19.6* 23.9* 24.3* 26.4* 30.1*  MCV 104.8* 99.2 95.7 97.1 97.4  PLT 226 175 155 176 214     Signed:  Barton Dubois MD.  [facility] 06/29/2020, 2:54 PM

## 2020-06-30 ENCOUNTER — Encounter (HOSPITAL_COMMUNITY): Payer: Self-pay | Admitting: Internal Medicine

## 2020-06-30 LAB — CULTURE, BLOOD (ROUTINE X 2)
Culture: NO GROWTH
Culture: NO GROWTH
Special Requests: ADEQUATE
Special Requests: ADEQUATE

## 2020-07-04 ENCOUNTER — Telehealth: Payer: Self-pay

## 2020-07-04 NOTE — Telephone Encounter (Signed)
Appointment scheduled.

## 2020-07-05 ENCOUNTER — Telehealth: Payer: Self-pay | Admitting: Cardiology

## 2020-07-05 DIAGNOSIS — I10 Essential (primary) hypertension: Secondary | ICD-10-CM

## 2020-07-05 NOTE — Telephone Encounter (Signed)
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 He said he couldn't get his shoe off. He had not been doing any activities out of his normal routine.   He was on a heart-healthy diet in the hospital but "hasn't eaten a lot since I got out"  He eats a sandwich with ham about every day  He reports good UOP with lasix  He has lost weight over the last 6 months He has no SOB  Educated patient on dietary choices and monitoring for foods high in sodium - reviewed salty 6.  Advised OK to take extra lasix x1 dose but would suggest maybe he do this tomorrow, as to not keep him up going to bathroom this evening.  Advised he elevate legs, wear compression stockings if able.   Call back on Thursday with update/if symptoms not resolved/improved.

## 2020-07-05 NOTE — Telephone Encounter (Signed)
Pt c/o swelling: STAT is pt has developed SOB within 24 hours  1) How much weight have you gained and in what time span? States he has lost 35 lbs  2) If swelling, where is the swelling located? ankles  3) Are you currently taking a fluid pill? yes  4) Are you currently SOB? no  5) Do you have a log of your daily weights (if so, list)? Last was 185 and has been 160 for the last 6 months  6) Have you gained 3 pounds in a day or 5 pounds in a week? no  7) Have you traveled recently? no   Patient states he started having the swelling in his ankles yesterday afternoon. He states they also both hurt, but his right hurts more. He states he is not having any other symptoms.

## 2020-07-07 NOTE — Telephone Encounter (Signed)
Patient states his feet and ankles are still swollen. He states he has not noticed any improvement, but does not have any other symptoms.

## 2020-07-07 NOTE — Telephone Encounter (Signed)
Spoke with patient of Dr. Percival Spanish who reports LE edema has not resolved. He has some SOB when bending over for 2-3 months. He has no weight gain. He took extra dose of lasix as advised previously.   He is eating ham sandwiches often - previously discussed  He is not wearing compression stockings - said his regular socks are already tight.   His swelling does not resolve overnight  Patient was recently in hospital for pneumonia, COVID, anemia, chronic renal insufficency Patient sees PCP on 4/29 Patient sees Gerrianne Scale PA in [place] on 5/9  Advised will send message to MD to review and advise

## 2020-07-07 NOTE — Telephone Encounter (Signed)
Patient called with MD advice as noted below. He voiced understanding. BMET ordered - he will get done at PCP

## 2020-07-07 NOTE — Addendum Note (Signed)
Addended by: Fidel Levy on: 07/07/2020 05:10 PM   Modules accepted: Orders

## 2020-07-07 NOTE — Telephone Encounter (Signed)
Message  He can increase his Lasix to 40 mg bid x 3 days only. Should get a BMET next week. Cut down the salt.   ----- Message -----  From: Fidel Levy, RN  Sent: 07/07/2020  3:37 PM EDT  To: Minus Breeding, MD, Lelon Perla, MD    ----- Message from Fidel Levy, RN sent at 07/07/2020 3:37 PM EDT -----  Please advise if lasix should be increased? For how long?

## 2020-07-08 ENCOUNTER — Other Ambulatory Visit: Payer: Self-pay

## 2020-07-08 ENCOUNTER — Other Ambulatory Visit: Payer: MEDICARE

## 2020-07-08 DIAGNOSIS — I10 Essential (primary) hypertension: Secondary | ICD-10-CM | POA: Diagnosis not present

## 2020-07-09 LAB — BASIC METABOLIC PANEL
BUN/Creatinine Ratio: 12 (ref 10–24)
BUN: 17 mg/dL (ref 8–27)
CO2: 20 mmol/L (ref 20–29)
Calcium: 8.6 mg/dL (ref 8.6–10.2)
Chloride: 101 mmol/L (ref 96–106)
Creatinine, Ser: 1.43 mg/dL — ABNORMAL HIGH (ref 0.76–1.27)
Glucose: 114 mg/dL — ABNORMAL HIGH (ref 65–99)
Potassium: 4 mmol/L (ref 3.5–5.2)
Sodium: 141 mmol/L (ref 134–144)
eGFR: 47 mL/min/{1.73_m2} — ABNORMAL LOW (ref >59)

## 2020-07-12 DIAGNOSIS — R601 Generalized edema: Secondary | ICD-10-CM | POA: Diagnosis not present

## 2020-07-12 NOTE — Progress Notes (Signed)
Cardiology Office Note    Date:  07/25/2020   ID:  Jon Reid, DOB 1928/04/03, MRN [id number]   PCP:  Claretta Fraise, [place]  Cardiologist:  Minus Breeding, MD  Advanced Practice Provider:  No care team member to display Electrophysiologist:  None   [phone]   Chief Complaint  Patient presents with  . Shortness of Breath    History of Present Illness:  Jon Reid is a 87 y.o. male with a history of CAD s/p CABG x 4 in 2014, low risk NST 2017, HTN, HL, moderate AS, stroke, CKD III, prostate cancer, and bifascicular block   Patient was seen in the hospital 06/27/2020 for evaluation of demand ischemia in the setting of GI bleed and COVID-19 infection. TTE low risk with EF 50-55% stable moderate AS no effusion. Troponin elevation may be from COVID no signs of Acute ischemic syndrome or ECG changes. He is not a candidate for cath with acute GI bleed now post 3 units transfusion. Plan to consider outpatient NST.  Patient called in with increase edema and eating a lot of salt. Lasix increased for 3 days by Dr. Percival Spanish 07/05/20. PCP increased it to 20 mg bid 07/15/20. Crt 1.43 that day   Patient comes in for f/u. Chronic DOE.Very weak-trouble walking across the room. Usually happens when his blood count is low. No bleeding problems.Had erosive gastritis without bleeding on endo 06/27/20. To have repeat endo.  No chest pain. Edema improved on increased lasix. Does eat canned soups. Eats out daily-fast food. 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    Past Medical History:  Diagnosis Date  . Arthritis   . Carcinoma of prostate [facility])    prostate  . CKD (chronic kidney disease), stage III ([place])   . Coronary artery disease    a. 10/2012 Cath: LM 20-30d, lAD 80p, 74m, 80-90d, LCX 90-64m, OM1 90, RCA 50p, 61m, 50d, RPDA 90, EF nl; b. 10/2012 CABG x 4 (LIMA-LAD, SVG-OM1, SVG-PDA->RPL); c. 07/2015 MV: Basal and  proximal septal infarct w/o ischemia. EF 57%-->low risk.  . First degree AV block 01/28/2019   Noted on EKG  . GERD (gastroesophageal reflux disease)   . Gout   . HTN (hypertension)   . Macrocytosis without anemia 01/08/2012  . Moderate aortic stenosis    a. 10/2012 Echo: EF 55-60%, no rwma, BAE, mild AI, mild TR; b. 06/2020 Echo: EF 50-55%, no rwma, mild LVH. Mod dil LA. Mild MR. Mod AS.  Marland Kitchen [name] affect 02/09/2020  . 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 Sleep apnea    Questionable  . Spinal stenosis   . Stroke [facility])     Past Surgical History:  Procedure Laterality Date  . CARDIAC CATHETERIZATION  11/07/2012   Dr Acie Fredrickson  . CATARACT EXTRACTION W/ INTRAOCULAR LENS IMPLANT Bilateral   . CORONARY ARTERY BYPASS GRAFT N/A 11/11/2012   Procedure: CORONARY ARTERY BYPASS GRAFTING times four using Right Greater Saphenous Vein Graft harvested endoscopically and Left Internal Mammary Artery.;  Surgeon: Ivin Poot, MD;  Location: [place];  Service: Open Heart Surgery;  Laterality: N/A;  . ESOPHAGOGASTRODUODENOSCOPY (EGD) WITH PROPOFOL N/A 06/27/2020   Procedure: ESOPHAGOGASTRODUODENOSCOPY (EGD) WITH PROPOFOL;  Surgeon: [name], DO;  [facility];  Service: Endoscopy;  Laterality: N/A;  . INGUINAL HERNIA REPAIR Right 01/10/2017  Procedure: OPEN RIGHT HERNIA REPAIR INGUINAL;  Surgeon: Ileana Roup, MD;  Location: [facility];  Service: General;  Laterality: Right;  . INSERTION OF MESH Right 01/10/2017   Procedure: INSERTION OF MESH;  Surgeon: Ileana Roup, MD;  Location: [facility];  Service: General;  Laterality: Right;  . INTRAOPERATIVE TRANSESOPHAGEAL ECHOCARDIOGRAM N/A 11/11/2012   Procedure: INTRAOPERATIVE TRANSESOPHAGEAL ECHOCARDIOGRAM;  Surgeon: Ivin Poot, MD;  Location: [place];  Service: Open Heart Surgery;  Laterality: N/A;  . LEFT HEART CATHETERIZATION WITH CORONARY ANGIOGRAM N/A 11/07/2012    Procedure: LEFT HEART CATHETERIZATION WITH CORONARY ANGIOGRAM;  Surgeon: Thayer Headings, MD;  Location: [facility] CATH LAB;  Service: Cardiovascular;  Laterality: N/A;  . LUMBAR LAMINECTOMY/DECOMPRESSION MICRODISCECTOMY N/A 02/05/2019   Procedure: LUMBAR LAMINECTOMY/DECOMPRESSION L3-L4;  Surgeon: Latanya Maudlin, MD;  Location: [facility];  Service: Orthopedics;  Laterality: N/A;  64min  . LYMPH NODE DISSECTION     Bilateral pelvic  . RETROPUBIC PROSTATECTOMY     Radical    Current Medications: Current Meds  Medication Sig  . ascorbic acid (VITAMIN C) 500 MG tablet Take 1 tablet (500 mg total) by mouth daily.  Marland Kitchen aspirin EC 81 MG tablet Take 81 mg by mouth daily.  . carbidopa-levodopa (SINEMET IR) 25-100 MG tablet 1/2 tablet three times a day for 4 weeks, then take one tablet three times a day  . Cholecalciferol (VITAMIN D) 2000 units CAPS Take 2,000 Units by mouth daily.  . fluticasone (FLONASE) 50 MCG/ACT nasal spray One to 2 sprays each nostril at bedtime (Patient taking differently: Place 1-2 sprays into both nostrils daily.)  . furosemide (LASIX) 20 MG tablet Take 1 tablet (20 mg total) by mouth 2 (two) times daily. One at breakfast and another at lunch.  . Iron, Ferrous Sulfate, 325 (65 Fe) MG TABS Take 1 tablet by mouth daily.  . nitroGLYCERIN (NITROSTAT) 0.4 MG SL tablet Place 1 tablet (0.4 mg total) under the tongue every 5 (five) minutes as needed for chest pain.  . pantoprazole (PROTONIX) 40 MG tablet Take 1 tablet (40 mg total) by mouth 2 (two) times daily. TAKE 1 TABLET ONCE DAILY FOR REFLUX  . triamcinolone cream (KENALOG) 0.1 % Apply 1 application topically daily as needed (itching).  . zinc sulfate 220 (50 Zn) MG capsule Take 1 capsule (220 mg total) by mouth daily.     Allergies:   Crestor [rosuvastatin]   Social History   Socioeconomic History  . Marital status: Married    Spouse name: [name]  . Number of children: 3  . Years of education: 63  . Highest education level: High  school graduate  Occupational History  . Occupation: Retired  Tobacco Use  . Smoking status: Never Smoker  . Smokeless tobacco: Never Used  Vaping Use  . Vaping Use: Never used  Substance and Sexual Activity  . Alcohol use: Yes    Comment: Rare  . Drug use: No  . Sexual activity: Not Currently  Other Topics Concern  . Not on file  Social History Narrative   Lives at home with wife/sons will stay with them occassionally   Married with 3 children   Denies caffeine use    Social Determinants of Radio broadcast assistant Strain: Not on file  Food Insecurity: Not on file  Transportation Needs: Not on file  Physical Activity: Not on file  Stress: Not on file  Social Connections: Not on file     Family History:  The patient's family history includes  Aneurysm in his father; Cancer in his mother; Hypertension in his father; Parkinson's disease in his son; Stroke in his father.   ROS:   Please see the history of present illness.    ROS All other systems reviewed and are negative.   PHYSICAL EXAM:   VS:  BP 126/66   Pulse 84   Ht 5\' 11"  (1.803 m)   Wt 147 lb (66.7 kg)   SpO2 97%   BMI 20.50 kg/m   Physical Exam  GEN: Thin, elderly in no acute distress  Neck: no JVD, carotid bruits, or masses Cardiac:RRR; 3/6 systolic murmur LSB  Respiratory:  clear to auscultation bilaterally, normal work of breathing GI: soft, nontender, nondistended, + BS Ext: trace edema,without cyanosis, clubbing,  Good distal pulses bilaterally Neuro:  Alert and Oriented x 3,  Psych: euthymic mood, full affect  Wt Readings from Last 3 Encounters:  07/25/20 147 lb (66.7 kg)  07/15/20 155 lb (70.3 kg)  06/28/20 157 lb 10.1 oz (71.5 kg)      Studies/Labs Reviewed:   EKG:  EKG is not ordered today.    Recent Labs: 10/06/2019: TSH 1.900 06/27/2020: Magnesium 1.9 07/15/2020: ALT 14; BUN 18; Creatinine, Ser 1.43; Hemoglobin 9.1; Platelets 576; Potassium 4.2; Sodium 141   Lipid Panel     Component Value Date/Time   CHOL 159 05/27/2019 1004   CHOL 173 11/06/2012 1058   TRIG 64 05/27/2019 1004   TRIG 55 07/07/2015 0945   TRIG 73 11/06/2012 1058   HDL 57 05/27/2019 1004   HDL 54 07/07/2015 0945   HDL 66 11/06/2012 1058   CHOLHDL 2.8 05/27/2019 1004   CHOLHDL 2.5 11/08/2012 0500   VLDL 11 11/08/2012 0500   LDLCALC 89 05/27/2019 1004   LDLCALC 92 09/03/2013 0959   LDLCALC 92 11/06/2012 1058    Additional studies/ records that were reviewed today include:  2D echo 06/26/2020 IMPRESSIONS   1. Distal septal hyokinesis. Left ventricular ejection fraction, by  estimation, is 50 to 55%. The left ventricle has low normal function. The  left ventricle demonstrates regional wall motion abnormalities (see  scoring diagram/findings for description).  There is mild left ventricular hypertrophy. Left ventricular diastolic  parameters are indeterminate.   2. Right ventricular systolic function is normal. The right ventricular  size is normal. There is mildly elevated pulmonary artery systolic  pressure.   3. Left atrial size was moderately dilated.   4. The mitral valve is abnormal. Mild mitral valve regurgitation. No  evidence of mitral stenosis. Moderate mitral annular calcification.   5. The aortic valve is tricuspid. There is moderate calcification of the  aortic valve. There is moderate thickening of the aortic valve. Aortic  valve regurgitation is mild. Moderate aortic valve stenosis.   6. The inferior vena cava is normal in size with greater than 50%  respiratory variability, suggesting right atrial pressure of 3 mmHg.     Risk Assessment/Calculations:         ASSESSMENT:    1. Coronary artery disease involving coronary bypass graft of native heart without angina pectoris   2. Upper GI bleed   3. Essential hypertension   4. Hyperlipidemia, unspecified hyperlipidemia type   5. Stage 3a chronic kidney disease (HCC)   6. Nonrheumatic aortic valve stenosis       PLAN:  In order of problems listed above:    CAD/Demand ischemia in setting of GIB and covid19 infection.  Pt w/o h/o CAD s/p  CABG x 4 in 2014  and low-risk MV in 2017.  consider NST in future. Patient without chest pain but chronic DOE-similar to when blood count low. Will check CBC and bmet today. Hold off on NST at this time.  Chronic lower ext edema-getting fast food daily. 2 gm sodium diet. Check labs, may need to lower lasix back to 20 mg daily.   GIB/Acute blood loss anemia-no recent bleeding on protonix but very weak and DOE-check CBC. Has f/u scheduled with GI    Essential HTN::  Stable     HLD   CKD III:  Cr1.43 07/15/20-recheck today. May need to lower lasix dose.    Moderate Ao Stenosis: stable gradients not likely  a TAVR candidate        Shared Decision Making/Informed Consent        Medication Adjustments/Labs and Tests Ordered: Current medicines are reviewed at length with the patient today.  Concerns regarding medicines are outlined above.  Medication changes, Labs and Tests ordered today are listed in the Patient Instructions below. Patient Instructions   Medication Instructions:  Your physician recommends that you continue on your current medications as directed. Please refer to the Current Medication list given to you today.  *If you need a refill on your cardiac medications before your next appointment, please call your pharmacy*   Lab Work: Your physician recommends that you return for lab work in: Today   If you have labs (blood work) drawn today and your tests are completely normal, you will receive your results only by: Marland Kitchen MyChart Message (if you have MyChart) OR . A paper copy in the mail If you have any lab test that is abnormal or we need to change your treatment, we will call you to review the results.   Testing/Procedures: NONE    Follow-Up: At [facility], you and your health needs are our priority.  As part of our continuing  mission to provide you with exceptional heart care, we have created designated Provider Care Teams.  These Care Teams include your primary Cardiologist (physician) and Advanced Practice Providers (APPs -  Physician Assistants and Nurse Practitioners) who all work together to provide you with the care you need, when you need it.  We recommend signing up for the patient portal called "MyChart".  Sign up information is provided on this After Visit Summary.  MyChart is used to connect with patients for Virtual Visits (Telemedicine).  Patients are able to view lab/test results, encounter notes, upcoming appointments, etc.  Non-urgent messages can be sent to your provider as well.   To learn more about what you can do with MyChart, go to [email or web address].    Your next appointment:   1-2 month(s)  The format for your next appointment:   In Person  Provider:   Minus Breeding, MD   Other Instructions Thank you for choosing [facility]!  Two Gram Sodium Diet 2000 mg  What is Sodium? Sodium is a mineral found naturally in many foods. The most significant source of sodium in the diet is table salt, which is about 40% sodium.  Processed, convenience, and preserved foods also contain a large amount of sodium.  The body needs only 500 mg of sodium daily to function,  A normal diet provides more than enough sodium even if you do not use salt.  Why Limit Sodium? A build up of sodium in the body can cause thirst, increased blood pressure, shortness of breath, and water retention.  Decreasing sodium in the diet can reduce  edema and risk of heart attack or stroke associated with high blood pressure.  Keep in mind that there are many other factors involved in these health problems.  Heredity, obesity, lack of exercise, cigarette smoking, stress and what you eat all play a role.  General Guidelines:  Do not add salt at the table or in cooking.  One teaspoon of salt contains over 2 grams of  sodium.  Read food labels  Avoid processed and convenience foods  Ask your dietitian before eating any foods not dicussed in the menu planning guidelines  Consult your physician if you wish to use a salt substitute or a sodium containing medication such as antacids.  Limit milk and milk products to 16 oz (2 cups) per day.  Shopping Hints:  READ LABELS!! "Dietetic" does not necessarily mean low sodium.  Salt and other sodium ingredients are often added to foods during processing.   Menu Planning Guidelines Food Group Choose More Often Avoid  Beverages (see also the milk group All fruit juices, low-sodium, salt-free vegetables juices, low-sodium carbonated beverages Regular vegetable or tomato juices, commercially softened water used for drinking or cooking  Breads and Cereals Enriched white, wheat, rye and pumpernickel bread, hard rolls and dinner rolls; muffins, cornbread and waffles; most dry cereals, cooked cereal without added salt; unsalted crackers and breadsticks; low sodium or homemade bread crumbs Bread, rolls and crackers with salted tops; quick breads; instant hot cereals; pancakes; commercial bread stuffing; self-rising flower and biscuit mixes; regular bread crumbs or cracker crumbs  Desserts and Sweets Desserts and sweets mad with mild should be within allowance Instant pudding mixes and cake mixes  Fats Butter or margarine; vegetable oils; unsalted salad dressings, regular salad dressings limited to 1 Tbs; light, sour and heavy cream Regular salad dressings containing bacon fat, bacon bits, and salt pork; snack dips made with instant soup mixes or processed cheese; salted nuts  Fruits Most fresh, frozen and canned fruits Fruits processed with salt or sodium-containing ingredient (some dried fruits are processed with sodium sulfites        Vegetables Fresh, frozen vegetables and low- sodium canned vegetables Regular canned vegetables, sauerkraut, pickled vegetables, and  others prepared in brine; frozen vegetables in sauces; vegetables seasoned with ham, bacon or salt pork  Condiments, Sauces, Miscellaneous  Salt substitute with physician's approval; pepper, herbs, spices; vinegar, lemon or lime juice; hot pepper sauce; garlic powder, onion powder, low sodium soy sauce (1 Tbs.); low sodium condiments (ketchup, chili sauce, mustard) in limited amounts (1 tsp.) fresh ground horseradish; unsalted tortilla chips, pretzels, potato chips, popcorn, salsa (1/4 cup) Any seasoning made with salt including garlic salt, celery salt, onion salt, and seasoned salt; sea salt, rock salt, kosher salt; meat tenderizers; monosodium glutamate; mustard, regular soy sauce, barbecue, sauce, chili sauce, teriyaki sauce, steak sauce, Worcestershire sauce, and most flavored vinegars; canned gravy and mixes; regular condiments; salted snack foods, olives, picles, relish, horseradish sauce, catsup   Food preparation: Try these seasonings Meats:    Pork Sage, onion Serve with applesauce  Chicken Poultry seasoning, thyme, parsley Serve with cranberry sauce  Lamb Curry powder, rosemary, garlic, thyme Serve with mint sauce or jelly  Veal Marjoram, basil Serve with current jelly, cranberry sauce  Beef Pepper, bay leaf Serve with dry mustard, unsalted chive butter  Fish [place] leaf, dill Serve with unsalted lemon butter, unsalted parsley butter  Vegetables:    Asparagus Lemon juice   Broccoli Lemon juice   Carrots Mustard dressing parsley, mint, nutmeg, glazed  with unsalted butter and sugar   Green beans Marjoram, lemon juice, nutmeg,dill seed   Tomatoes Basil, marjoram, onion   Spice /blend for [employer]" 4 tsp ground thyme 1 tsp ground sage 3 tsp ground rosemary 4 tsp ground marjoram   Test your knowledge 1. A product that says "Salt Free" may still contain sodium. True or False 2. Garlic Powder and Hot Pepper Sauce an be used as alternative seasonings.True or False 3. Processed foods have  more sodium than fresh foods.  True or False 4. Canned Vegetables have less sodium than froze True or False  WAYS TO DECREASE YOUR SODIUM INTAKE 1. Avoid the use of added salt in cooking and at the table.  Table salt (and other prepared seasonings which contain salt) is probably one of the greatest sources of sodium in the diet.  Unsalted foods can gain flavor from the sweet, sour, and butter taste sensations of herbs and spices.  Instead of using salt for seasoning, try the following seasonings with the foods listed.  Remember: how you use them to enhance natural food flavors is limited only by your creativity... Allspice-Meat, fish, eggs, fruit, peas, red and yellow vegetables Almond Extract-Fruit baked goods Anise Seed-Sweet breads, fruit, carrots, beets, cottage cheese, cookies (tastes like licorice) Basil-Meat, fish, eggs, vegetables, rice, vegetables salads, soups, sauces Bay Leaf-Meat, fish, stews, poultry Burnet-Salad, vegetables (cucumber-like flavor) Caraway Seed-Bread, cookies, cottage cheese, meat, vegetables, cheese, rice Cardamon-Baked goods, fruit, soups Celery Powder or seed-Salads, salad dressings, sauces, meatloaf, soup, bread.Do not use  celery salt Chervil-Meats, salads, fish, eggs, vegetables, cottage cheese (parsley-like flavor) Chili Power-Meatloaf, chicken cheese, corn, eggplant, egg dishes Chives-Salads cottage cheese, egg dishes, soups, vegetables, sauces Cilantro-Salsa, casseroles Cinnamon-Baked goods, fruit, pork, lamb, chicken, carrots Cloves-Fruit, baked goods, fish, pot roast, green beans, beets, carrots Coriander-Pastry, cookies, meat, salads, cheese (lemon-orange flavor) Cumin-Meatloaf, fish,cheese, eggs, cabbage,fruit pie (caraway flavor) [employer], fruit, eggs, fish, poultry, cottage cheese, vegetables Dill Seed-Meat, cottage cheese, poultry, vegetables, fish, salads, bread Fennel Seed-Bread, cookies, apples, pork, eggs, fish, beets, cabbage, cheese,  Licorice-like flavor Garlic-(buds or powder) Salads, meat, poultry, fish, bread, butter, vegetables, potatoes.Do not  use garlic salt Ginger-Fruit, vegetables, baked goods, meat, fish, poultry Horseradish Root-Meet, vegetables, butter Lemon Juice or Extract-Vegetables, fruit, tea, baked goods, fish salads Mace-Baked goods fruit, vegetables, fish, poultry (taste like nutmeg) Maple Extract-Syrups Marjoram-Meat, chicken, fish, vegetables, breads, green salads (taste like Sage) Mint-Tea, lamb, sherbet, vegetables, desserts, carrots, cabbage Mustard, Dry or Seed-Cheese, eggs, meats, vegetables, poultry Nutmeg-Baked goods, fruit, chicken, eggs, vegetables, desserts Onion Powder-Meat, fish, poultry, vegetables, cheese, eggs, bread, rice salads (Do not use   Onion salt) Orange Extract-Desserts, baked goods Oregano-Pasta, eggs, cheese, onions, pork, lamb, fish, chicken, vegetables, green salads Paprika-Meat, fish, poultry, eggs, cheese, vegetables Parsley Flakes-Butter, vegetables, meat fish, poultry, eggs, bread, salads (certain forms may   Contain sodium Pepper-Meat fish, poultry, vegetables, eggs Peppermint Extract-Desserts, baked goods Poppy Seed-Eggs, bread, cheese, fruit dressings, baked goods, noodles, vegetables, cottage  [employer], poultry, meat, fish, cauliflower, turnips,eggs bread Saffron-Rice, bread, veal, chicken, fish, eggs Sage-Meat, fish, poultry, onions, eggplant, tomateos, pork, stews Savory-Eggs, salads, poultry, meat, rice, vegetables, soups, pork Tarragon-Meat, poultry, fish, eggs, butter, vegetables (licorice-like flavor)  Thyme-Meat, poultry, fish, eggs, vegetables, (clover-like flavor), sauces, soups Tumeric-Salads, butter, eggs, fish, rice, vegetables (saffron-like flavor) Vanilla Extract-Baked goods, candy Vinegar-Salads, vegetables, meat marinades Walnut Extract-baked goods, candy  2. Choose your Foods Wisely   The  following is a list of foods to avoid which are high  in sodium:  Meats-Avoid all smoked, canned, salt cured, dried and kosher meat and fish as well as Anchovies   [employer] meats:Bologna, Liverwurst, Pastrami Canned meat or fish  Marinated herring Caviar    Pepperoni Corned Beef   Pizza Dried chipped beef  Salami Frozen breaded fish or meat Salt pork Frankfurters or hot dogs  Sardines Gefilte fish   Sausage Ham (boiled ham, Proscuitto Smoked butt    spiced ham)   Spam      TV Dinners Vegetables Canned vegetables (Regular) Relish Canned mushrooms  Sauerkraut Olives    Tomato juice Pickles  Bakery and Dessert Products Canned puddings  Cream pies Cheesecake   Decorated cakes Cookies  Beverages/Juices Tomato juice, regular  Gatorade   V-8 vegetable juice, regular  Breads and Cereals Biscuit mixes   Salted potato chips, corn chips, pretzels Bread stuffing mixes  Salted crackers and rolls Pancake and waffle mixes Self-rising flour  Seasonings Accent    Meat sauces Barbecue sauce  Meat tenderizer Catsup    Monosodium glutamate (MSG) Celery salt   Onion salt Chili sauce   Prepared mustard Garlic salt   Salt, seasoned salt, sea salt Gravy mixes   Soy sauce Horseradish   Steak sauce Ketchup   Tartar sauce Lite salt    Teriyaki sauce Marinade mixes   Worcestershire sauce  Others Baking powder   Cocoa and cocoa mixes Baking soda   Commercial casserole mixes Candy-caramels, chocolate  Dehydrated soups    Bars, fudge,nougats  Instant rice and pasta mixes Canned broth or soup  Maraschino cherries Cheese, aged and processed cheese and cheese spreads  Learning Assessment Quiz  Indicated T (for True) or F (for False) for each of the following statements:  1. _____ Fresh fruits and vegetables and unprocessed grains are generally low in sodium 2. _____ Water may contain a considerable amount of sodium, depending on the source 3. _____ You can always tell if a  food is high in sodium by tasting it 4. _____ Certain laxatives my be high in sodium and should be avoided unless prescribed   by a physician or pharmacist 5. _____ Salt substitutes may be used freely by anyone on a sodium restricted diet 6. _____ Sodium is present in table salt, food additives and as a natural component of   most foods 7. _____ Table salt is approximately 90% sodium 8. _____ Limiting sodium intake may help prevent excess fluid accumulation in the body 9. _____ On a sodium-restricted diet, seasonings such as bouillon soy sauce, and    cooking wine should be used in place of table salt 10. _____ On an ingredient list, a product which lists monosodium glutamate as the first   ingredient is an appropriate food to include on a low sodium diet  Circle the best answer(s) to the following statements (Hint: there may be more than one correct answer)  11. On a low-sodium diet, some acceptable snack items are:    A. Olives  F. Bean dip   K. Grapefruit juice    B. Salted Pretzels G. Commercial Popcorn   L. Canned peaches    C. Carrot Sticks  H. Bouillon   M. Unsalted nuts   D. [place] fries  I. Peanut butter crackers N. Salami   E. Sweet pickles J. Tomato Juice   O. Pizza  12.  Seasonings that may be used freely on a reduced - sodium diet include   A. Lemon wedges F.Monosodium glutamate K.  Celery seed    B.Soysauce   G. Pepper   L. Mustard powder   C. Sea salt  H. Cooking wine  M. Onion flakes   D. Vinegar  E. Prepared horseradish N. Salsa   E. Sage   J. Worcestershire sauce  O. [address]     Sumner Boast, PA-C  07/25/2020 11:57 AM    [facility] [address], [place], [place]  [address] Phone: [phone]; Fax: [phone]

## 2020-07-15 ENCOUNTER — Other Ambulatory Visit: Payer: Self-pay

## 2020-07-15 ENCOUNTER — Ambulatory Visit (INDEPENDENT_AMBULATORY_CARE_PROVIDER_SITE_OTHER): Payer: MEDICARE

## 2020-07-15 ENCOUNTER — Ambulatory Visit (INDEPENDENT_AMBULATORY_CARE_PROVIDER_SITE_OTHER): Payer: MEDICARE | Admitting: Family Medicine

## 2020-07-15 ENCOUNTER — Encounter: Payer: Self-pay | Admitting: Family Medicine

## 2020-07-15 VITALS — BP 133/68 | HR 72 | Temp 98.0°F | Resp 20 | Ht 72.0 in | Wt 155.0 lb

## 2020-07-15 DIAGNOSIS — R600 Localized edema: Secondary | ICD-10-CM

## 2020-07-15 DIAGNOSIS — J1282 Pneumonia due to coronavirus disease 2019: Secondary | ICD-10-CM | POA: Diagnosis not present

## 2020-07-15 DIAGNOSIS — Z8616 Personal history of COVID-19: Secondary | ICD-10-CM | POA: Diagnosis not present

## 2020-07-15 DIAGNOSIS — Z09 Encounter for follow-up examination after completed treatment for conditions other than malignant neoplasm: Secondary | ICD-10-CM

## 2020-07-15 DIAGNOSIS — U071 COVID-19: Secondary | ICD-10-CM

## 2020-07-15 DIAGNOSIS — J189 Pneumonia, unspecified organism: Secondary | ICD-10-CM | POA: Diagnosis not present

## 2020-07-15 MED ORDER — FUROSEMIDE 20 MG PO TABS: 20.0000 mg | ORAL_TABLET | Freq: Two times a day (BID) | ORAL | 2 refills | Status: DC

## 2020-07-15 NOTE — Progress Notes (Signed)
Subjective:  Patient ID: [name], male    DOB: [birth year]  Age: 87 y.o. MRN: [id number]  CC: Medical Management of Chronic Issues (Recent GI bleed and PNA due to covid) and Hospitalization Follow-up   HPI MAXIM BEDEL presents for hospital follow up of covid pneumonia with GI bleed. Admitted on 06/25/20 and  DC on 4/13. Noted legs swelling starting 4 days ago. Denies dyspnea.   Depression screen [facility] 2/9 07/15/2020 06/21/2020 06/21/2020  Decreased Interest 0 0 0  Down, Depressed, Hopeless 0 0 0  PHQ - 2 Score 0 0 0  Altered sleeping - - -  Tired, decreased energy - - -  Change in appetite - - -  Feeling bad or failure about yourself  - - -  Trouble concentrating - - -  Moving slowly or fidgety/restless - - -  Suicidal thoughts - - -  PHQ-9 Score - - -  Some recent data might be hidden    History Filmore has a past medical history of Arthritis, Carcinoma of prostate ([place]), CKD (chronic kidney disease), stage III ([place]), Coronary artery disease, First degree AV block (01/28/2019), GERD (gastroesophageal reflux disease), Gout, HTN (hypertension), Macrocytosis without anemia (01/08/2012), Moderate aortic stenosis, 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   He has a past surgical history that includes Retropubic prostatectomy; Lymph node dissection; Cardiac catheterization (11/07/2012); Coronary artery bypass graft (N/A, 11/11/2012); Intraoprative transesophageal echocardiogram (N/A, 11/11/2012); left heart catheterization with coronary angiogram (N/A, 11/07/2012); Inguinal hernia repair (Right, 01/10/2017); Insertion of mesh (Right, 01/10/2017); Cataract extraction w/ intraocular lens implant (Bilateral); Lumbar laminectomy/decompression microdiscectomy (N/A, 02/05/2019); and Esophagogastroduodenoscopy (egd) with propofol (N/A, 06/27/2020).   His family history includes Aneurysm in his  father; Cancer in his mother; Hypertension in his father; Parkinson's disease in his son; Stroke in his father.He reports that he has never smoked. He has never used smokeless tobacco. He reports current alcohol use. He reports that he does not use drugs.    ROS Review of Systems  Constitutional: Negative for fever.  Respiratory: Negative for shortness of breath.   Cardiovascular: Positive for leg swelling. Negative for chest pain.  Musculoskeletal: Negative for arthralgias.  Skin: Negative for rash.    Objective:  BP 133/68   Pulse 72   Temp 98 F (36.7 C)   Resp 20   Ht 6' (1.829 m)   Wt 155 lb (70.3 kg)   SpO2 99%   BMI 21.02 kg/m   BP Readings from Last 3 Encounters:  07/15/20 133/68  06/29/20 122/65  06/21/20 122/64    Wt Readings from Last 3 Encounters:  07/15/20 155 lb (70.3 kg)  06/28/20 157 lb 10.1 oz (71.5 kg)  06/21/20 155 lb 12.8 oz (70.7 kg)     Physical Exam Vitals reviewed.  Constitutional:      Appearance: He is well-developed.  HENT:     Head: Normocephalic and atraumatic.     Right Ear: Tympanic membrane and external ear normal. No decreased hearing noted.     Left Ear: Tympanic membrane and external ear normal. No decreased hearing noted.     Mouth/Throat:     Pharynx: No oropharyngeal exudate or posterior oropharyngeal erythema.  Eyes:     Pupils: Pupils are equal, round, and reactive to light.  Cardiovascular:     Rate and Rhythm: Normal rate and regular rhythm.     Heart sounds: No murmur heard.  Pulmonary:     Effort: No respiratory distress.     Breath sounds: No wheezing.  Abdominal:     General: Bowel sounds are normal.     Palpations: Abdomen is soft. There is no mass.     Tenderness: There is no abdominal tenderness.  Musculoskeletal:     Cervical back: Normal range of motion and neck supple.       Assessment & Plan:   Fontaine was seen today for medical management of chronic issues and hospitalization  follow-up.  Diagnoses and all orders for this visit:  Pneumonia due to COVID-19 virus -     DG Chest 2 View; Future -     CBC with Differential/Platelet [facility] discharge follow-up -     DG Chest 2 View; Future  Edema of both legs  Other orders -     furosemide (LASIX) 20 MG tablet; Take 1 tablet (20 mg total) by mouth 2 (two) times daily. One at breakfast and another at lunch.       I have changed Barnabas Lister A. Nolet's furosemide. I am also having him maintain his fluticasone, nitroGLYCERIN, aspirin EC, Vitamin D, carbidopa-levodopa, pantoprazole, Iron (Ferrous Sulfate), ascorbic acid, zinc sulfate, and triamcinolone cream.  Allergies as of 07/15/2020      Reactions   Crestor [rosuvastatin] Other (See Comments)   GYNECOMASTIA AND SWELLING      Medication List       Accurate as of July 15, 2020 11:59 PM. If you have any questions, ask your nurse or doctor.        ascorbic acid 500 MG tablet Commonly known as: VITAMIN C Take 1 tablet (500 mg total) by mouth daily.   aspirin EC 81 MG tablet Take 81 mg by mouth daily.   carbidopa-levodopa 25-100 MG tablet Commonly known as: SINEMET IR 1/2 tablet three times a day for 4 weeks, then take one tablet three times a day   fluticasone 50 MCG/ACT nasal spray Commonly known as: FLONASE One to 2 sprays each nostril at bedtime What changed:   how much to take  how to take this  when to take this  additional instructions   furosemide 20 MG tablet Commonly known as: LASIX Take 1 tablet (20 mg total) by mouth 2 (two) times daily. One at breakfast and another at lunch. What changed:   when to take this  additional instructions Changed by: Claretta Fraise, MD   Iron (Ferrous Sulfate) 325 (65 Fe) MG Tabs Take 1 tablet by mouth daily.   nitroGLYCERIN 0.4 MG SL tablet Commonly known as: NITROSTAT Place 1 tablet (0.4 mg total) under the tongue every 5 (five) minutes as needed for chest pain.    pantoprazole 40 MG tablet Commonly known as: PROTONIX Take 1 tablet (40 mg total) by mouth 2 (two) times daily. TAKE 1 TABLET ONCE DAILY FOR REFLUX   triamcinolone cream 0.1 % Commonly known as: KENALOG Apply 1 application topically daily as needed (itching).   Vitamin D 50 MCG (2000 UT) Caps Take 2,000 Units by mouth daily.   zinc sulfate 220 (50 Zn) MG capsule Take 1 capsule (220 mg total) by mouth daily.        Follow-up: No follow-ups on file.  Claretta Fraise, M.D.

## 2020-07-16 LAB — CMP14+EGFR
ALT: 14 IU/L (ref 0–44)
AST: 14 IU/L (ref 0–40)
Albumin/Globulin Ratio: 1.4 (ref 1.2–2.2)
Albumin: 3.8 g/dL (ref 3.6–4.6)
Alkaline Phosphatase: 74 IU/L (ref 44–121)
BUN/Creatinine Ratio: 13 (ref 10–24)
BUN: 18 mg/dL (ref 8–27)
Bilirubin Total: 0.2 mg/dL (ref 0.0–1.2)
CO2: 24 mmol/L (ref 20–29)
Calcium: 9.2 mg/dL (ref 8.6–10.2)
Chloride: 99 mmol/L (ref 96–106)
Creatinine, Ser: 1.43 mg/dL — ABNORMAL HIGH (ref 0.76–1.27)
Globulin, Total: 2.8 g/dL (ref 1.5–4.5)
Glucose: 108 mg/dL — ABNORMAL HIGH (ref 65–99)
Potassium: 4.2 mmol/L (ref 3.5–5.2)
Sodium: 141 mmol/L (ref 134–144)
Total Protein: 6.6 g/dL (ref 6.0–8.5)
eGFR: 47 mL/min/{1.73_m2} — ABNORMAL LOW (ref >59)

## 2020-07-16 LAB — CBC WITH DIFFERENTIAL/PLATELET
Basophils Absolute: 0 10*3/uL (ref 0.0–0.2)
Basos: 1 %
EOS (ABSOLUTE): 0.1 10*3/uL (ref 0.0–0.4)
Eos: 1 %
Hematocrit: 28.6 % — ABNORMAL LOW (ref 37.5–51.0)
Hemoglobin: 9.1 g/dL — ABNORMAL LOW (ref 13.0–17.7)
Immature Grans (Abs): 0 10*3/uL (ref 0.0–0.1)
Immature Granulocytes: 0 %
Lymphocytes Absolute: 2.1 10*3/uL (ref 0.7–3.1)
Lymphs: 29 %
MCH: 29.6 pg (ref 26.6–33.0)
MCHC: 31.8 g/dL (ref 31.5–35.7)
MCV: 93 fL (ref 79–97)
Monocytes Absolute: 0.7 10*3/uL (ref 0.1–0.9)
Monocytes: 9 %
Neutrophils Absolute: 4.5 10*3/uL (ref 1.4–7.0)
Neutrophils: 60 %
Platelets: 576 10*3/uL — ABNORMAL HIGH (ref 150–450)
RBC: 3.07 x10E6/uL — ABNORMAL LOW (ref 4.14–5.80)
RDW: 13.4 % (ref 11.6–15.4)
WBC: 7.4 10*3/uL (ref 3.4–10.8)

## 2020-07-18 ENCOUNTER — Encounter: Payer: Self-pay | Admitting: Family Medicine

## 2020-07-18 NOTE — Progress Notes (Signed)
Your chest x-ray shows no acute condition. There is some osteoporosis in the spine.

## 2020-07-20 ENCOUNTER — Encounter: Payer: Self-pay | Admitting: Gastroenterology

## 2020-07-22 ENCOUNTER — Other Ambulatory Visit: Payer: Self-pay

## 2020-07-22 DIAGNOSIS — R710 Precipitous drop in hematocrit: Secondary | ICD-10-CM

## 2020-07-25 ENCOUNTER — Encounter: Payer: Self-pay | Admitting: Physician Assistant

## 2020-07-25 ENCOUNTER — Other Ambulatory Visit: Payer: Self-pay

## 2020-07-25 ENCOUNTER — Ambulatory Visit (INDEPENDENT_AMBULATORY_CARE_PROVIDER_SITE_OTHER): Payer: MEDICARE | Admitting: Physician Assistant

## 2020-07-25 ENCOUNTER — Other Ambulatory Visit (HOSPITAL_COMMUNITY)
Admission: RE | Admit: 2020-07-25 | Discharge: 2020-07-25 | Disposition: A | Discharge status: Home / Self Care | Payer: MEDICARE | Source: Ambulatory Visit | Attending: Physician Assistant | Admitting: Physician Assistant

## 2020-07-25 VITALS — BP 126/66 | HR 84 | Ht 71.0 in | Wt 147.0 lb

## 2020-07-25 DIAGNOSIS — I25810 Atherosclerosis of coronary artery bypass graft(s) without angina pectoris: Secondary | ICD-10-CM

## 2020-07-25 DIAGNOSIS — K922 Gastrointestinal hemorrhage, unspecified: Secondary | ICD-10-CM | POA: Diagnosis not present

## 2020-07-25 DIAGNOSIS — I208 Other forms of angina pectoris: Secondary | ICD-10-CM | POA: Diagnosis not present

## 2020-07-25 DIAGNOSIS — E785 Hyperlipidemia, unspecified: Secondary | ICD-10-CM

## 2020-07-25 DIAGNOSIS — I10 Essential (primary) hypertension: Secondary | ICD-10-CM

## 2020-07-25 DIAGNOSIS — N1831 Chronic kidney disease, stage 3a: Secondary | ICD-10-CM | POA: Insufficient documentation

## 2020-07-25 DIAGNOSIS — I350 Nonrheumatic aortic (valve) stenosis: Secondary | ICD-10-CM

## 2020-07-25 LAB — BASIC METABOLIC PANEL
Anion gap: 10 (ref 5–15)
BUN: 21 mg/dL (ref 8–23)
CO2: 27 mmol/L (ref 22–32)
Calcium: 8.9 mg/dL (ref 8.9–10.3)
Chloride: 101 mmol/L (ref 98–111)
Creatinine, Ser: 1.46 mg/dL — ABNORMAL HIGH (ref 0.61–1.24)
GFR, Estimated: 46 mL/min — ABNORMAL LOW (ref >60)
Glucose, Bld: 122 mg/dL — ABNORMAL HIGH (ref 70–99)
Potassium: 3.6 mmol/L (ref 3.5–5.1)
Sodium: 138 mmol/L (ref 135–145)

## 2020-07-25 LAB — CBC
HCT: 31.5 % — ABNORMAL LOW (ref 39.0–52.0)
Hemoglobin: 9.5 g/dL — ABNORMAL LOW (ref 13.0–17.0)
MCH: 29.9 pg (ref 26.0–34.0)
MCHC: 30.2 g/dL (ref 30.0–36.0)
MCV: 99.1 fL (ref 80.0–100.0)
Platelets: 368 10*3/uL (ref 150–400)
RBC: 3.18 MIL/uL — ABNORMAL LOW (ref 4.22–5.81)
RDW: 15.8 % — ABNORMAL HIGH (ref 11.5–15.5)
WBC: 11.2 10*3/uL — ABNORMAL HIGH (ref 4.0–10.5)
nRBC: 0 % (ref 0.0–0.2)

## 2020-07-25 LAB — BRAIN NATRIURETIC PEPTIDE: B Natriuretic Peptide: 265 pg/mL — ABNORMAL HIGH (ref 0.0–100.0)

## 2020-07-25 NOTE — Patient Instructions (Addendum)
Medication Instructions:  Your physician recommends that you continue on your current medications as directed. Please refer to the Current Medication list given to you today.  *If you need a refill on your cardiac medications before your next appointment, please call your pharmacy*   Lab Work: Your physician recommends that you return for lab work in: Today   If you have labs (blood work) drawn today and your tests are completely normal, you will receive your results only by: Marland Kitchen MyChart Message (if you have MyChart) OR . A paper copy in the mail If you have any lab test that is abnormal or we need to change your treatment, we will call you to review the results.   Testing/Procedures: NONE    Follow-Up: At [facility], you and your health needs are our priority.  As part of our continuing mission to provide you with exceptional heart care, we have created designated Provider Care Teams.  These Care Teams include your primary Cardiologist (physician) and Advanced Practice Providers (APPs -  Physician Assistants and Nurse Practitioners) who all work together to provide you with the care you need, when you need it.  We recommend signing up for the patient portal called "MyChart".  Sign up information is provided on this After Visit Summary.  MyChart is used to connect with patients for Virtual Visits (Telemedicine).  Patients are able to view lab/test results, encounter notes, upcoming appointments, etc.  Non-urgent messages can be sent to your provider as well.   To learn more about what you can do with MyChart, go to [email or web address].    Your next appointment:   1-2 month(s)  The format for your next appointment:   In Person  Provider:   Minus Breeding, MD   Other Instructions Thank you for choosing [facility]!  Two Gram Sodium Diet 2000 mg  What is Sodium? Sodium is a mineral found naturally in many foods. The most significant source of sodium in the diet is  table salt, which is about 40% sodium.  Processed, convenience, and preserved foods also contain a large amount of sodium.  The body needs only 500 mg of sodium daily to function,  A normal diet provides more than enough sodium even if you do not use salt.  Why Limit Sodium? A build up of sodium in the body can cause thirst, increased blood pressure, shortness of breath, and water retention.  Decreasing sodium in the diet can reduce edema and risk of heart attack or stroke associated with high blood pressure.  Keep in mind that there are many other factors involved in these health problems.  Heredity, obesity, lack of exercise, cigarette smoking, stress and what you eat all play a role.  General Guidelines:  Do not add salt at the table or in cooking.  One teaspoon of salt contains over 2 grams of sodium.  Read food labels  Avoid processed and convenience foods  Ask your dietitian before eating any foods not dicussed in the menu planning guidelines  Consult your physician if you wish to use a salt substitute or a sodium containing medication such as antacids.  Limit milk and milk products to 16 oz (2 cups) per day.  Shopping Hints:  READ LABELS!! "Dietetic" does not necessarily mean low sodium.  Salt and other sodium ingredients are often added to foods during processing.   Menu Planning Guidelines Food Group Choose More Often Avoid  Beverages (see also the milk group All fruit juices, low-sodium, salt-free vegetables juices,  low-sodium carbonated beverages Regular vegetable or tomato juices, commercially softened water used for drinking or cooking  Breads and Cereals Enriched white, wheat, rye and pumpernickel bread, hard rolls and dinner rolls; muffins, cornbread and waffles; most dry cereals, cooked cereal without added salt; unsalted crackers and breadsticks; low sodium or homemade bread crumbs Bread, rolls and crackers with salted tops; quick breads; instant hot cereals; pancakes;  commercial bread stuffing; self-rising flower and biscuit mixes; regular bread crumbs or cracker crumbs  Desserts and Sweets Desserts and sweets mad with mild should be within allowance Instant pudding mixes and cake mixes  Fats Butter or margarine; vegetable oils; unsalted salad dressings, regular salad dressings limited to 1 Tbs; light, sour and heavy cream Regular salad dressings containing bacon fat, bacon bits, and salt pork; snack dips made with instant soup mixes or processed cheese; salted nuts  Fruits Most fresh, frozen and canned fruits Fruits processed with salt or sodium-containing ingredient (some dried fruits are processed with sodium sulfites        Vegetables Fresh, frozen vegetables and low- sodium canned vegetables Regular canned vegetables, sauerkraut, pickled vegetables, and others prepared in brine; frozen vegetables in sauces; vegetables seasoned with ham, bacon or salt pork  Condiments, Sauces, Miscellaneous  Salt substitute with physician's approval; pepper, herbs, spices; vinegar, lemon or lime juice; hot pepper sauce; garlic powder, onion powder, low sodium soy sauce (1 Tbs.); low sodium condiments (ketchup, chili sauce, mustard) in limited amounts (1 tsp.) fresh ground horseradish; unsalted tortilla chips, pretzels, potato chips, popcorn, salsa (1/4 cup) Any seasoning made with salt including garlic salt, celery salt, onion salt, and seasoned salt; sea salt, rock salt, kosher salt; meat tenderizers; monosodium glutamate; mustard, regular soy sauce, barbecue, sauce, chili sauce, teriyaki sauce, steak sauce, Worcestershire sauce, and most flavored vinegars; canned gravy and mixes; regular condiments; salted snack foods, olives, picles, relish, horseradish sauce, catsup   Food preparation: Try these seasonings Meats:    Pork Sage, onion Serve with applesauce  Chicken Poultry seasoning, thyme, parsley Serve with cranberry sauce  Lamb Curry powder, rosemary, garlic, thyme  Serve with mint sauce or jelly  Veal Marjoram, basil Serve with current jelly, cranberry sauce  Beef Pepper, bay leaf Serve with dry mustard, unsalted chive butter  Fish [place] leaf, dill Serve with unsalted lemon butter, unsalted parsley butter  Vegetables:    Asparagus Lemon juice   Broccoli Lemon juice   Carrots Mustard dressing parsley, mint, nutmeg, glazed with unsalted butter and sugar   Green beans Marjoram, lemon juice, nutmeg,dill seed   Tomatoes Basil, marjoram, onion   Spice /blend for [employer]" 4 tsp ground thyme 1 tsp ground sage 3 tsp ground rosemary 4 tsp ground marjoram   Test your knowledge 1. A product that says "Salt Free" may still contain sodium. True or False 2. Garlic Powder and Hot Pepper Sauce an be used as alternative seasonings.True or False 3. Processed foods have more sodium than fresh foods.  True or False 4. Canned Vegetables have less sodium than froze True or False  WAYS TO DECREASE YOUR SODIUM INTAKE 1. Avoid the use of added salt in cooking and at the table.  Table salt (and other prepared seasonings which contain salt) is probably one of the greatest sources of sodium in the diet.  Unsalted foods can gain flavor from the sweet, sour, and butter taste sensations of herbs and spices.  Instead of using salt for seasoning, try the following seasonings with the foods listed.  Remember: how  you use them to enhance natural food flavors is limited only by your creativity... Allspice-Meat, fish, eggs, fruit, peas, red and yellow vegetables Almond Extract-Fruit baked goods Anise Seed-Sweet breads, fruit, carrots, beets, cottage cheese, cookies (tastes like licorice) Basil-Meat, fish, eggs, vegetables, rice, vegetables salads, soups, sauces Bay Leaf-Meat, fish, stews, poultry Burnet-Salad, vegetables (cucumber-like flavor) Caraway Seed-Bread, cookies, cottage cheese, meat, vegetables, cheese, rice Cardamon-Baked goods, fruit, soups Celery Powder or seed-Salads,  salad dressings, sauces, meatloaf, soup, bread.Do not use  celery salt Chervil-Meats, salads, fish, eggs, vegetables, cottage cheese (parsley-like flavor) Chili Power-Meatloaf, chicken cheese, corn, eggplant, egg dishes Chives-Salads cottage cheese, egg dishes, soups, vegetables, sauces Cilantro-Salsa, casseroles Cinnamon-Baked goods, fruit, pork, lamb, chicken, carrots Cloves-Fruit, baked goods, fish, pot roast, green beans, beets, carrots Coriander-Pastry, cookies, meat, salads, cheese (lemon-orange flavor) Cumin-Meatloaf, fish,cheese, eggs, cabbage,fruit pie (caraway flavor) [employer], fruit, eggs, fish, poultry, cottage cheese, vegetables Dill Seed-Meat, cottage cheese, poultry, vegetables, fish, salads, bread Fennel Seed-Bread, cookies, apples, pork, eggs, fish, beets, cabbage, cheese, Licorice-like flavor Garlic-(buds or powder) Salads, meat, poultry, fish, bread, butter, vegetables, potatoes.Do not  use garlic salt Ginger-Fruit, vegetables, baked goods, meat, fish, poultry Horseradish Root-Meet, vegetables, butter Lemon Juice or Extract-Vegetables, fruit, tea, baked goods, fish salads Mace-Baked goods fruit, vegetables, fish, poultry (taste like nutmeg) Maple Extract-Syrups Marjoram-Meat, chicken, fish, vegetables, breads, green salads (taste like Sage) Mint-Tea, lamb, sherbet, vegetables, desserts, carrots, cabbage Mustard, Dry or Seed-Cheese, eggs, meats, vegetables, poultry Nutmeg-Baked goods, fruit, chicken, eggs, vegetables, desserts Onion Powder-Meat, fish, poultry, vegetables, cheese, eggs, bread, rice salads (Do not use   Onion salt) Orange Extract-Desserts, baked goods Oregano-Pasta, eggs, cheese, onions, pork, lamb, fish, chicken, vegetables, green salads Paprika-Meat, fish, poultry, eggs, cheese, vegetables Parsley Flakes-Butter, vegetables, meat fish, poultry, eggs, bread, salads (certain forms may   Contain sodium Pepper-Meat fish, poultry, vegetables,  eggs Peppermint Extract-Desserts, baked goods Poppy Seed-Eggs, bread, cheese, fruit dressings, baked goods, noodles, vegetables, cottage  [employer], poultry, meat, fish, cauliflower, turnips,eggs bread Saffron-Rice, bread, veal, chicken, fish, eggs Sage-Meat, fish, poultry, onions, eggplant, tomateos, pork, stews Savory-Eggs, salads, poultry, meat, rice, vegetables, soups, pork Tarragon-Meat, poultry, fish, eggs, butter, vegetables (licorice-like flavor)  Thyme-Meat, poultry, fish, eggs, vegetables, (clover-like flavor), sauces, soups Tumeric-Salads, butter, eggs, fish, rice, vegetables (saffron-like flavor) Vanilla Extract-Baked goods, candy Vinegar-Salads, vegetables, meat marinades Walnut Extract-baked goods, candy  2. Choose your Foods Wisely   The following is a list of foods to avoid which are high in sodium:  Meats-Avoid all smoked, canned, salt cured, dried and kosher meat and fish as well as Anchovies   [employer] meats:Bologna, Liverwurst, Pastrami Canned meat or fish  Marinated herring Caviar    Pepperoni Corned Beef   Pizza Dried chipped beef  Salami Frozen breaded fish or meat Salt pork Frankfurters or hot dogs  Sardines Gefilte fish   Sausage Ham (boiled ham, Proscuitto Smoked butt    spiced ham)   Spam      TV Dinners Vegetables Canned vegetables (Regular) Relish Canned mushrooms  Sauerkraut Olives    Tomato juice Pickles  Bakery and Dessert Products Canned puddings  Cream pies Cheesecake   Decorated cakes Cookies  Beverages/Juices Tomato juice, regular  Gatorade   V-8 vegetable juice, regular  Breads and Cereals Biscuit mixes   Salted potato chips, corn chips, pretzels Bread stuffing mixes  Salted crackers and rolls Pancake and waffle mixes Self-rising flour  Seasonings Accent    Meat sauces Barbecue sauce  Meat tenderizer Catsup  Monosodium glutamate (MSG) Celery salt   Onion salt Chili  sauce   Prepared mustard Garlic salt   Salt, seasoned salt, sea salt Gravy mixes   Soy sauce Horseradish   Steak sauce Ketchup   Tartar sauce Lite salt    Teriyaki sauce Marinade mixes   Worcestershire sauce  Others Baking powder   Cocoa and cocoa mixes Baking soda   Commercial casserole mixes Candy-caramels, chocolate  Dehydrated soups    Bars, fudge,nougats  Instant rice and pasta mixes Canned broth or soup  Maraschino cherries Cheese, aged and processed cheese and cheese spreads  Learning Assessment Quiz  Indicated T (for True) or F (for False) for each of the following statements:  1. _____ Fresh fruits and vegetables and unprocessed grains are generally low in sodium 2. _____ Water may contain a considerable amount of sodium, depending on the source 3. _____ You can always tell if a food is high in sodium by tasting it 4. _____ Certain laxatives my be high in sodium and should be avoided unless prescribed   by a physician or pharmacist 5. _____ Salt substitutes may be used freely by anyone on a sodium restricted diet 6. _____ Sodium is present in table salt, food additives and as a natural component of   most foods 7. _____ Table salt is approximately 90% sodium 8. _____ Limiting sodium intake may help prevent excess fluid accumulation in the body 9. _____ On a sodium-restricted diet, seasonings such as bouillon soy sauce, and    cooking wine should be used in place of table salt 10. _____ On an ingredient list, a product which lists monosodium glutamate as the first   ingredient is an appropriate food to include on a low sodium diet  Circle the best answer(s) to the following statements (Hint: there may be more than one correct answer)  11. On a low-sodium diet, some acceptable snack items are:    A. Olives  F. Bean dip   K. Grapefruit juice    B. Salted Pretzels G. Commercial Popcorn   L. Canned peaches    C. Carrot Sticks  H. Bouillon   M. Unsalted nuts   D. [place]  fries  I. Peanut butter crackers N. Salami   E. Sweet pickles J. Tomato Juice   O. Pizza  12.  Seasonings that may be used freely on a reduced - sodium diet include   A. Lemon wedges F.Monosodium glutamate K. Celery seed    B.Soysauce   G. Pepper   L. Mustard powder   C. Sea salt  H. Cooking wine  M. Onion flakes   D. Vinegar  E. Prepared horseradish N. Salsa   E. Sage   J. Worcestershire sauce  O. Chutney

## 2020-08-17 ENCOUNTER — Other Ambulatory Visit: Payer: Self-pay

## 2020-08-17 ENCOUNTER — Encounter: Payer: Self-pay | Admitting: Family Medicine

## 2020-08-17 ENCOUNTER — Ambulatory Visit (INDEPENDENT_AMBULATORY_CARE_PROVIDER_SITE_OTHER): Payer: MEDICARE | Admitting: Family Medicine

## 2020-08-17 VITALS — BP 128/74 | HR 82 | Temp 97.6°F | Ht 71.0 in | Wt 147.0 lb

## 2020-08-17 DIAGNOSIS — R600 Localized edema: Secondary | ICD-10-CM | POA: Diagnosis not present

## 2020-08-17 DIAGNOSIS — R5383 Other fatigue: Secondary | ICD-10-CM | POA: Diagnosis not present

## 2020-08-17 NOTE — Progress Notes (Signed)
Assessment & Plan:  1. Bilateral lower extremity edema Encouraged to weigh himself daily and keep a log. He should notify PCP or cardiologist for a weight gain of 3 lbs in a day or 5 lbs in a week. Advised to wear compression hose daily - apply in the AM and remove in the PM.  - Brain natriuretic peptide - CBC with Differential/Platelet - CMP14+EGFR - TSH  2. Other fatigue Discussed fatigue could be due to a number of things including recent COVID, anemia from his GI bleed, CKD, or heart failure.  - CBC with Differential/Platelet - CMP14+EGFR - TSH   Follow up plan: Return if symptoms worsen or fail to improve.  Hendricks Limes, MSN, APRN, FNP-C [facility]  Subjective:   Patient ID: Jon Reid, male    DOB: Jun 18, 1928, 87 y.o.   MRN: [id number]  HPI: Jon Reid is a 87 y.o. male presenting on 08/17/2020 for Foot Swelling (Patient states he has been having bilateral feet and lower leg swelling x 2-3 weeks. ) and Fatigue (Patient states it has been on going on and off. Therapy? )  Patient reports both lower extremities have been swelling for the past 2-3 weeks. He also c/o fatigue/decreased energy. He had a GI bleed and COVID-19 less than two months ago which resulted in a hospitalization. Per the cardiology note from last month the patient has been eating a lot of salt. Today when asked about this he stated he "doesn't re-salt". He does take furosemide 20 mg twice daily. He does not wear compression hose or weight himself daily. He does not know what he currently weighs at all.    ROS: Negative unless specifically indicated above in HPI.   Relevant past medical history reviewed and updated as indicated.   Allergies and medications reviewed and updated.   Current Outpatient Medications:    ascorbic acid (VITAMIN C) 500 MG tablet, Take 1 tablet (500 mg total) by mouth daily., Disp: 30 tablet, Rfl: 1   aspirin EC 81 MG tablet, Take 81 mg by mouth  daily., Disp: , Rfl:    carbidopa-levodopa (SINEMET IR) 25-100 MG tablet, 1/2 tablet three times a day for 4 weeks, then take one tablet three times a day, Disp: 90 tablet, Rfl: 3   Cholecalciferol (VITAMIN D) 2000 units CAPS, Take 2,000 Units by mouth daily., Disp: , Rfl:    fluticasone (FLONASE) 50 MCG/ACT nasal spray, One to 2 sprays each nostril at bedtime (Patient taking differently: Place 1-2 sprays into both nostrils daily.), Disp: 16 g, Rfl: 6   furosemide (LASIX) 20 MG tablet, Take 1 tablet (20 mg total) by mouth 2 (two) times daily. One at breakfast and another at lunch., Disp: 30 tablet, Rfl: 2   Iron, Ferrous Sulfate, 325 (65 Fe) MG TABS, Take 1 tablet by mouth daily., Disp: 30 tablet, Rfl: 3   nitroGLYCERIN (NITROSTAT) 0.4 MG SL tablet, Place 1 tablet (0.4 mg total) under the tongue every 5 (five) minutes as needed for chest pain., Disp: 25 tablet, Rfl: PRN   pantoprazole (PROTONIX) 40 MG tablet, Take 1 tablet (40 mg total) by mouth 2 (two) times daily. TAKE 1 TABLET ONCE DAILY FOR REFLUX, Disp: 60 tablet, Rfl: 2   triamcinolone cream (KENALOG) 0.1 %, Apply 1 application topically daily as needed (itching)., Disp: , Rfl:    zinc sulfate 220 (50 Zn) MG capsule, Take 1 capsule (220 mg total) by mouth daily., Disp: 30 capsule, Rfl: 1  Allergies  Allergen Reactions   Crestor [Rosuvastatin] Other (See Comments)    GYNECOMASTIA AND SWELLING    Objective:   BP 128/74   Pulse 82   Temp 97.6 F (36.4 C) (Temporal)   Ht 5' 11"  (1.803 m)   Wt 147 lb (66.7 kg)   SpO2 99%   BMI 20.50 kg/m    Physical Exam Vitals reviewed.  Constitutional:      General: He is not in acute distress.    Appearance: Normal appearance. He is not ill-appearing, toxic-appearing or diaphoretic.  HENT:     Head: Normocephalic and atraumatic.  Eyes:     General: No scleral icterus.       Right eye: No discharge.        Left eye: No discharge.     Conjunctiva/sclera: Conjunctivae normal.   Cardiovascular:     Rate and Rhythm: Normal rate and regular rhythm.     Heart sounds: Normal heart sounds. No murmur heard.   No friction rub. No gallop.  Pulmonary:     Effort: Pulmonary effort is normal. No respiratory distress.     Breath sounds: Normal breath sounds. No stridor. No wheezing, rhonchi or rales.  Musculoskeletal:        General: Normal range of motion.     Cervical back: Normal range of motion.     Right lower leg: Edema present.     Left lower leg: Edema present.  Skin:    General: Skin is warm and dry.  Neurological:     Mental Status: He is alert and oriented to person, place, and time. Mental status is at baseline.  Psychiatric:        Mood and Affect: Mood normal.        Behavior: Behavior normal.        Thought Content: Thought content normal.        Judgment: Judgment normal.

## 2020-08-17 NOTE — Patient Instructions (Signed)
Wear compression hose - apply first thing in the morning and remove at night.  Weigh yourself daily at the same time in the same amount of clothing. Keep a log so that you can tell if you have a 3 lb weight gain in a day or 5 lbs in a week. If you do, you need to let us or your cardiologist know.

## 2020-08-18 DIAGNOSIS — Z08 Encounter for follow-up examination after completed treatment for malignant neoplasm: Secondary | ICD-10-CM | POA: Diagnosis not present

## 2020-08-18 DIAGNOSIS — Z85828 Personal history of other malignant neoplasm of skin: Secondary | ICD-10-CM | POA: Diagnosis not present

## 2020-08-18 DIAGNOSIS — L570 Actinic keratosis: Secondary | ICD-10-CM | POA: Diagnosis not present

## 2020-08-18 DIAGNOSIS — X32XXXD Exposure to sunlight, subsequent encounter: Secondary | ICD-10-CM | POA: Diagnosis not present

## 2020-08-18 LAB — CMP14+EGFR
ALT: 11 IU/L (ref 0–44)
AST: 22 IU/L (ref 0–40)
Albumin/Globulin Ratio: 1.5 (ref 1.2–2.2)
Albumin: 4.1 g/dL (ref 3.5–4.6)
Alkaline Phosphatase: 90 IU/L (ref 44–121)
BUN/Creatinine Ratio: 11 (ref 10–24)
BUN: 18 mg/dL (ref 10–36)
Bilirubin Total: 0.4 mg/dL (ref 0.0–1.2)
CO2: 23 mmol/L (ref 20–29)
Calcium: 9.6 mg/dL (ref 8.6–10.2)
Chloride: 104 mmol/L (ref 96–106)
Creatinine, Ser: 1.59 mg/dL — ABNORMAL HIGH (ref 0.76–1.27)
Globulin, Total: 2.8 g/dL (ref 1.5–4.5)
Glucose: 122 mg/dL — ABNORMAL HIGH (ref 65–99)
Potassium: 4.3 mmol/L (ref 3.5–5.2)
Sodium: 145 mmol/L — ABNORMAL HIGH (ref 134–144)
Total Protein: 6.9 g/dL (ref 6.0–8.5)
eGFR: 41 mL/min/{1.73_m2} — ABNORMAL LOW (ref >59)

## 2020-08-18 LAB — CBC WITH DIFFERENTIAL/PLATELET
Basophils Absolute: 0.1 10*3/uL (ref 0.0–0.2)
Basos: 1 %
EOS (ABSOLUTE): 0.1 10*3/uL (ref 0.0–0.4)
Eos: 1 %
Hematocrit: 32.7 % — ABNORMAL LOW (ref 37.5–51.0)
Hemoglobin: 10.1 g/dL — ABNORMAL LOW (ref 13.0–17.7)
Immature Grans (Abs): 0 10*3/uL (ref 0.0–0.1)
Immature Granulocytes: 0 %
Lymphocytes Absolute: 2.5 10*3/uL (ref 0.7–3.1)
Lymphs: 34 %
MCH: 28.9 pg (ref 26.6–33.0)
MCHC: 30.9 g/dL — ABNORMAL LOW (ref 31.5–35.7)
MCV: 94 fL (ref 79–97)
Monocytes Absolute: 0.7 10*3/uL (ref 0.1–0.9)
Monocytes: 9 %
Neutrophils Absolute: 4 10*3/uL (ref 1.4–7.0)
Neutrophils: 55 %
Platelets: 355 10*3/uL (ref 150–450)
RBC: 3.49 x10E6/uL — ABNORMAL LOW (ref 4.14–5.80)
RDW: 14.5 % (ref 11.6–15.4)
WBC: 7.3 10*3/uL (ref 3.4–10.8)

## 2020-08-18 LAB — TSH: TSH: 1.33 u[IU]/mL (ref 0.450–4.500)

## 2020-08-18 LAB — BRAIN NATRIURETIC PEPTIDE: BNP: 156.9 pg/mL — ABNORMAL HIGH (ref 0.0–100.0)

## 2020-08-18 NOTE — Progress Notes (Signed)
Referring Provider: Claretta Fraise, MD Primary Care Physician:  Claretta Fraise, MD Primary GI Physician: Dr. Abbey Chatters  Chief Complaint  Patient presents with  . hospital f/u    No further bleeding that he knows of    HPI:   Jon Reid is a 87 y.o. male presenting today for hospital follow-up.  Patient was admitted 06/25/2020 - 06/29/2020 for upper GI bleed, IDA, and COVID-19.  He presented with weakness fatigue and melena x3-4 weeks and a couple episodes of coffee-ground emesis.  Chronically taking Celebrex.  Chronically on Protonix daily for GERD. Hemoglobin 6.1 with baseline around 13.  Also found to be COVID-positive with no significant respiratory symptoms.  Underwent EGD 4/11 revealing large hiatal hernia, LA grade D esophagitis with no bleeding, normal examined duodenum.  Received 3 unit PRBCs with hemoglobin 9.6 on discharge.  Notably, he also had evidence of iron deficiency and low normal vitamin B12.  He received vitamin B12 during admission and was already taking oral iron every other day at home.  He was discharged on PPI twice daily, Carafate 4 times daily, iron daily, and recommended repeat EGD in 8 weeks.  Most recent labs completed 08/17/2020 with hemoglobin 10.1.  Today: No GERD symptoms.  Reports apples and corn bread want to stick in his esophagus. No nausea or vomiting, abdominal, melena, or brbpr. No NSAIDs aside from 81 mg aspirin. No longer taking celebrex. Can go 4-5 daily without a BM and will take a laxative. This is chronic. Doesn't take anything daily.   No CP or palpitation. Occasional SOB, especially with exertion. No cough. Chronic swelling in the LE, somewhat improved.   Down 10 lbs since hospitalization. Eats 3 meals a day most of the time. States his wife fusses at him for snacking at times rather than eating a meal.   Past Medical History:  Diagnosis Date  . Arthritis   . Carcinoma of prostate [facility])    prostate  . CKD (chronic kidney disease), stage  III ([place])   . Coronary artery disease    a. 10/2012 Cath: LM 20-30d, lAD 80p, 42m, 80-90d, LCX 90-78m, OM1 90, RCA 50p, 48m, 50d, RPDA 90, EF nl; b. 10/2012 CABG x 4 (LIMA-LAD, SVG-OM1, SVG-PDA->RPL); c. 07/2015 MV: Basal and proximal septal infarct w/o ischemia. EF 57%-->low risk.  . First degree AV block 01/28/2019   Noted on EKG  . GERD (gastroesophageal reflux disease)   . Gout   . HTN (hypertension)   . Macrocytosis without anemia 01/08/2012  . Moderate aortic stenosis    a. 10/2012 Echo: EF 55-60%, no rwma, BAE, mild AI, mild TR; b. 06/2020 Echo: EF 50-55%, no rwma, mild LVH. Mod dil LA. Mild MR. Mod AS.  Marland Kitchen [name] affect 02/09/2020  . 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 Sleep apnea    Questionable  . Spinal stenosis   . Stroke [facility])     Past Surgical History:  Procedure Laterality Date  . CARDIAC CATHETERIZATION  11/07/2012   Dr Acie Fredrickson  . CATARACT EXTRACTION W/ INTRAOCULAR LENS IMPLANT Bilateral   . CORONARY ARTERY BYPASS GRAFT N/A 11/11/2012   Procedure: CORONARY ARTERY BYPASS GRAFTING times four using Right Greater Saphenous Vein Graft harvested endoscopically and Left Internal Mammary Artery.;  Surgeon: Ivin Poot, MD;  Location: [place];  Service: Open Heart Surgery;  Laterality: N/A;  . ESOPHAGOGASTRODUODENOSCOPY (EGD) WITH PROPOFOL N/A 06/27/2020   Surgeon: Abbey Chatters,  Elon Alas, DO;  large hiatal hernia, LA grade D esophagitis with no bleeding, normal examined duodenum.   . INGUINAL HERNIA REPAIR Right 01/10/2017   Procedure: OPEN RIGHT HERNIA REPAIR INGUINAL;  Surgeon: Ileana Roup, MD;  Location: [facility];  Service: General;  Laterality: Right;  . INSERTION OF MESH Right 01/10/2017   Procedure: INSERTION OF MESH;  Surgeon: Ileana Roup, MD;  Location: [facility];  Service: General;  Laterality: Right;  . INTRAOPERATIVE TRANSESOPHAGEAL ECHOCARDIOGRAM N/A 11/11/2012   Procedure: INTRAOPERATIVE TRANSESOPHAGEAL  ECHOCARDIOGRAM;  Surgeon: Ivin Poot, MD;  Location: [place];  Service: Open Heart Surgery;  Laterality: N/A;  . LEFT HEART CATHETERIZATION WITH CORONARY ANGIOGRAM N/A 11/07/2012   Procedure: LEFT HEART CATHETERIZATION WITH CORONARY ANGIOGRAM;  Surgeon: Thayer Headings, MD;  [facility];  Service: Cardiovascular;  Laterality: N/A;  . LUMBAR LAMINECTOMY/DECOMPRESSION MICRODISCECTOMY N/A 02/05/2019   Procedure: LUMBAR LAMINECTOMY/DECOMPRESSION L3-L4;  Surgeon: Latanya Maudlin, MD;  Location: [facility];  Service: Orthopedics;  Laterality: N/A;  62min  . LYMPH NODE DISSECTION     Bilateral pelvic  . RETROPUBIC PROSTATECTOMY     Radical    Current Outpatient Medications  Medication Sig Dispense Refill  . ascorbic acid (VITAMIN C) 500 MG tablet Take 1 tablet (500 mg total) by mouth daily. 30 tablet 1  . aspirin EC 81 MG tablet Take 81 mg by mouth daily.    . carbidopa-levodopa (SINEMET IR) 25-100 MG tablet 1/2 tablet three times a day for 4 weeks, then take one tablet three times a day 90 tablet 3  . Cholecalciferol (VITAMIN D) 2000 units CAPS Take 2,000 Units by mouth daily.    . fluticasone (FLONASE) 50 MCG/ACT nasal spray One to 2 sprays each nostril at bedtime (Patient taking differently: Place 1-2 sprays into both nostrils daily.) 16 g 6  . furosemide (LASIX) 20 MG tablet Take 1 tablet (20 mg total) by mouth 2 (two) times daily. One at breakfast and another at lunch. 30 tablet 2  . Iron, Ferrous Sulfate, 325 (65 Fe) MG TABS Take 1 tablet by mouth daily. 30 tablet 3  . nitroGLYCERIN (NITROSTAT) 0.4 MG SL tablet Place 1 tablet (0.4 mg total) under the tongue every 5 (five) minutes as needed for chest pain. 25 tablet PRN  . pantoprazole (PROTONIX) 40 MG tablet Take 1 tablet (40 mg total) by mouth 2 (two) times daily. TAKE 1 TABLET ONCE DAILY FOR REFLUX 60 tablet 2  . triamcinolone cream (KENALOG) 0.1 % Apply 1 application topically daily as needed (itching).    . zinc sulfate 220 (50 Zn)  MG capsule Take 1 capsule (220 mg total) by mouth daily. 30 capsule 1   No current facility-administered medications for this visit.    Allergies as of 08/19/2020 - Review Complete 08/17/2020  Allergen Reaction Noted  . Crestor [rosuvastatin] Other (See Comments) 09/03/2013    Family History  Problem Relation Age of Onset  . Aneurysm Father        Cerebral  . Stroke Father   . Hypertension Father   . Cancer Mother        colon  . Parkinson's disease Son     Social History   Socioeconomic History  . Marital status: Married    Spouse name: [name]  . Number of children: 3  . Years of education: 71  . Highest education level: High school graduate  Occupational History  . Occupation: Retired  Tobacco Use  . Smoking status: Never Smoker  .  Smokeless tobacco: Never Used  Vaping Use  . Vaping Use: Never used  Substance and Sexual Activity  . Alcohol use: Yes    Comment: Rare  . Drug use: No  . Sexual activity: Not Currently  Other Topics Concern  . Not on file  Social History Narrative   Lives at home with wife/sons will stay with them occassionally   Married with 3 children   Denies caffeine use    Social Determinants of Radio broadcast assistant Strain: Not on file  Food Insecurity: Not on file  Transportation Needs: Not on file  Physical Activity: Not on file  Stress: Not on file  Social Connections: Not on file    Review of Systems: Gen: Denies fever, chills, cold or flulike symptoms, presyncope, syncope. CV: See HPI Resp: See HPI GI: See HPI Heme: See HPI  Physical Exam: BP 130/67   Pulse 70   Temp (!) 96.9 F (36.1 C) (Temporal)   Ht 5\' 11"  (1.803 m)   Wt 147 lb 6.4 oz (66.9 kg)   BMI 20.56 kg/m  General:   Alert and oriented. No distress noted. Pleasant and cooperative. Elderly, frail appearing.  Head:  Normocephalic and atraumatic. Eyes:  Conjuctiva clear without scleral icterus. Heart:  S1, S2 present.  Harsh systolic murmur. Lungs:   Clear to auscultation bilaterally. No wheezes, rales, or rhonchi. No distress.  Abdomen:  +BS, soft, non-tender and non-distended. No rebound or guarding. No HSM or masses noted. Msk:  Symmetrical without gross deformities. Normal posture. Extremities:  With 1-2+ bilateral lower extremity pitting edema to mid shin. Neurologic:  Alert and  oriented x4 Psych:  Normal mood and affect.   Assessment: 87 year old male with history of upper GI bleed in April 2022 and IDA presenting today for hospital follow-up.  He was hospitalized in April 2022 after presenting to the emergency room with weakness, fatigue, melena, and coffee-ground emesis.  He was found to have hemoglobin of 6.1.  EGD revealed large hiatal hernia, LA grade D esophagitis without bleeding, normal duodenum.  Required 3 unit PRBCs with hemoglobin 9.6 on discharge.  He was discharged on oral iron daily and PPI twice daily.  Dr. Abbey Chatters recommended repeating EGD in 8 weeks.  Clinically, he is doing very well with no overt GI bleeding.   Most recent lab 08/17/2020 with hemoglobin improved to 10.1.  He is compliant with iron. Reflux remains well controlled on PPI twice daily.  He does report dysphagia to apples and cornbread and may have developed esophageal narrowing or stricture in the setting of reflux esophagitis. Denies NSAIDs aside from 81 mg aspirin.  We will proceed with repeat EGD with possible dilation at this time.  Constipation: Reports chronic constipation.  May go 4 to 5 days without a bowel movement requiring a laxative.  Recommended MiraLAX daily, increasing water and fruit and vegetable intake.  Weight loss: Appears patient has lost about 10 pounds since hospitalization in April 2022.  However, it appears this weight loss occurred between April and May as weight has been stable over the last 1 month.  This may have been secondary to acute illness.  No significant GI symptoms at this time.  Recommended eating 3 meals a day and adding 1-2  Ensure, boost, or other protein shakes daily to maintain weight.  Continue to monitor.   Plan: 1.  Proceed with EGD +/- dilation with propofol with Dr. Abbey Chatters in the near future.The risks, benefits, and alternatives have been discussed with the  patient in detail. The patient states understanding and desires to proceed.  ASA IV Hold iron x7 days prior to procedure.  2.  Continue Protonix 40 mg twice daily 30 minutes before breakfast and dinner.  3.  Avoid all NSAIDs.  4.  MiraLAX 1 capful (17 g) daily in 8 ounces of water.  Advised to decrease frequency if he develops loose stools.  5.  Increase water intake and eat plenty of fruits and vegetables daily.  6.  Eat 3 meals a day and add 1-2 Ensure, boost, or other protein shakes daily to help maintain weight.  7.  Follow-up after procedures.    Aliene Altes, PA-C [facility] 08/19/2020

## 2020-08-19 ENCOUNTER — Ambulatory Visit: Payer: 59 | Admitting: Gastroenterology

## 2020-08-19 ENCOUNTER — Ambulatory Visit (INDEPENDENT_AMBULATORY_CARE_PROVIDER_SITE_OTHER): Payer: MEDICARE | Admitting: Gastroenterology

## 2020-08-19 ENCOUNTER — Encounter: Payer: Self-pay | Admitting: Gastroenterology

## 2020-08-19 VITALS — BP 130/67 | HR 70 | Temp 96.9°F | Ht 71.0 in | Wt 147.4 lb

## 2020-08-19 DIAGNOSIS — R1310 Dysphagia, unspecified: Secondary | ICD-10-CM | POA: Diagnosis not present

## 2020-08-19 DIAGNOSIS — Z8719 Personal history of other diseases of the digestive system: Secondary | ICD-10-CM | POA: Diagnosis not present

## 2020-08-19 DIAGNOSIS — R634 Abnormal weight loss: Secondary | ICD-10-CM | POA: Diagnosis not present

## 2020-08-19 DIAGNOSIS — K5900 Constipation, unspecified: Secondary | ICD-10-CM

## 2020-08-19 DIAGNOSIS — K2100 Gastro-esophageal reflux disease with esophagitis, without bleeding: Secondary | ICD-10-CM

## 2020-08-19 NOTE — Patient Instructions (Addendum)
We will arrange for you to have an upper endoscopy with possible stretching of your esophagus in the near future with Dr. Abbey Chatters. Hold iron for 7 days prior to your procedure.  Continue Protonix 40 mg twice daily 30 minutes before breakfast and dinner.   Continue to avoid all NSAID products including ibuprofen, Aleve, Advil, Goody powders, BC powders, and anything that says "NSAID" on the package.  For constipation: Try increasing your water intake. Eat plenty of fruits and vegetables daily. Use MiraLAX 1 capful (17 g) daily in 8 ounces of water.  You may reduce the frequency if you develop loose stools.   To help maintain weight: Ensure you are eating 3 meals a day. Add 1-2 Ensure, boost, or other protein shakes daily.  We will plan to follow-up with you in the office after your procedure.  Do not hesitate to call if you have any questions or concerns prior to your next visit.  It was good to see you today.   Aliene Altes, PA-C [facility]

## 2020-08-23 ENCOUNTER — Other Ambulatory Visit: Payer: Self-pay | Admitting: Family Medicine

## 2020-08-24 ENCOUNTER — Telehealth: Payer: Self-pay | Admitting: *Deleted

## 2020-08-24 NOTE — Telephone Encounter (Signed)
LMOVM to call back to schedule EGD +/-dil w/ propofol, Dr. Abbey Chatters, ASA 4

## 2020-08-30 NOTE — Telephone Encounter (Signed)
Letter mailed

## 2020-08-31 ENCOUNTER — Telehealth: Payer: Self-pay | Admitting: Family Medicine

## 2020-08-31 NOTE — Telephone Encounter (Signed)
He could hold off for now.

## 2020-08-31 NOTE — Telephone Encounter (Signed)
Patient's wife, [name] notified

## 2020-09-09 ENCOUNTER — Telehealth: Payer: Self-pay | Admitting: Internal Medicine

## 2020-09-09 NOTE — Telephone Encounter (Signed)
PLEASE CALL PATIENT RECEIVED LETTER TO SCHEUDLE PROCEDURE

## 2020-09-09 NOTE — Telephone Encounter (Signed)
Pre-op appt 7/15. Appt letter mailed with procedure instructions.

## 2020-09-09 NOTE — Telephone Encounter (Signed)
Called pt, EGD/-/+DIL w/Propofol w/Dr. Abbey Chatters ASA 4 scheduled for 10/03/20 at 2:00pm. Orders entered.  No PA required per [facility] website: BLOCKING - Product does not have notification requirements.

## 2020-09-21 ENCOUNTER — Other Ambulatory Visit: Payer: Self-pay

## 2020-09-21 ENCOUNTER — Ambulatory Visit (INDEPENDENT_AMBULATORY_CARE_PROVIDER_SITE_OTHER): Payer: MEDICARE | Admitting: Neurology

## 2020-09-21 ENCOUNTER — Encounter: Payer: Self-pay | Admitting: Neurology

## 2020-09-21 ENCOUNTER — Other Ambulatory Visit: Payer: Self-pay | Admitting: *Deleted

## 2020-09-21 VITALS — BP 125/70 | HR 76 | Ht 71.0 in | Wt 148.8 lb

## 2020-09-21 DIAGNOSIS — R269 Unspecified abnormalities of gait and mobility: Secondary | ICD-10-CM

## 2020-09-21 DIAGNOSIS — I208 Other forms of angina pectoris: Secondary | ICD-10-CM

## 2020-09-21 MED ORDER — CARBIDOPA-LEVODOPA 25-100 MG PO TABS: 1.5000 | ORAL_TABLET | Freq: Three times a day (TID) | ORAL | 3 refills | Status: AC

## 2020-09-21 MED ORDER — CARBIDOPA-LEVODOPA 25-100 MG PO TABS: 1.5000 | ORAL_TABLET | Freq: Three times a day (TID) | ORAL | 3 refills | Status: DC

## 2020-09-21 NOTE — Patient Instructions (Signed)
We will go up on the Sinemet ( carbidopa ) 25/100 mg tablets taking 1.5 tablets three times a day.  Sinemet (carbidopa) may result in confusion or hallucinations, drowsiness, nausea, or dizziness. If any significant side effects are noted, please contact our office. Sinemet may not be well absorbed when taken with high protein meals, if tolerated it is best to take 30-45 minutes before you eat.

## 2020-09-21 NOTE — Progress Notes (Signed)
Reason for visit: Gait disorder, parkinsonism  Jon Reid is an 87 y.o. male  History of present illness:  Jon Reid is a 87 year old right-handed white male with a history of a gait disorder.  The patient has had issues with shuffling his feet, he has some tremor and decreased arm swing on the right, for this reason he was given a trial on low-dose Sinemet.  The patient currently is on the 25/100 mg tablets of Sinemet taking 1 tablet 3 times daily.  It is not clear that he has gained much benefit with his walking.  He recently was in the hospital on 25 June 2020 with an upper GI bleed associated with esophagitis.  His hemoglobin dropped to 6.1.  He denies any issues with falls, at times he may feel weak with standing.  He reports arthritis type pain in the right knee.  He has had a prior issue with spinal stenosis of the low back and has had surgery for this.  He continues to have weight loss, the etiology is not known.  Since he was seen here on 31 May 2020, he has lost 11 pounds of weight.  He returns for further evaluation.  Past Medical History:  Diagnosis Date   Arthritis    Carcinoma of prostate ([place])    prostate   CKD (chronic kidney disease), stage III (HCC)    Coronary artery disease    a. 10/2012 Cath: LM 20-30d, lAD 80p, 84m, 80-90d, LCX 90-52m, OM1 90, RCA 50p, 4m, 50d, RPDA 90, EF nl; b. 10/2012 CABG x 4 (LIMA-LAD, SVG-OM1, SVG-PDA->RPL); c. 07/2015 MV: Basal and proximal septal infarct w/o ischemia. EF 57%-->low risk.   First degree AV block 01/28/2019   Noted on EKG   GERD (gastroesophageal reflux disease)    Gout    HTN (hypertension)    Macrocytosis without anemia 01/08/2012   Moderate aortic stenosis    a. 10/2012 Echo: EF 55-60%, no rwma, BAE, mild AI, mild TR; b. 06/2020 Echo: EF 50-55%, no rwma, mild LVH. Mod dil LA. Mild MR. Mod AS.   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   Sleep  apnea    Questionable   Spinal stenosis    Stroke [facility])     Past Surgical History:  Procedure Laterality Date   CARDIAC CATHETERIZATION  11/07/2012   Dr Acie Fredrickson   CATARACT EXTRACTION W/ INTRAOCULAR LENS IMPLANT Bilateral    CORONARY ARTERY BYPASS GRAFT N/A 11/11/2012   Procedure: CORONARY ARTERY BYPASS GRAFTING times four using Right Greater Saphenous Vein Graft harvested endoscopically and Left Internal Mammary Artery.;  Surgeon: Ivin Poot, MD;  Location: [facility];  Service: Open Heart Surgery;  Laterality: N/A;   ESOPHAGOGASTRODUODENOSCOPY (EGD) WITH PROPOFOL N/A 06/27/2020   Surgeon: [name], DO;  large hiatal hernia, LA grade D esophagitis with no bleeding, normal examined duodenum.    INGUINAL HERNIA REPAIR Right 01/10/2017   Procedure: OPEN RIGHT HERNIA REPAIR INGUINAL;  Surgeon: Ileana Roup, MD;  Location: [facility];  Service: General;  Laterality: Right;   INSERTION OF MESH Right 01/10/2017   Procedure: INSERTION OF MESH;  Surgeon: Ileana Roup, MD;  Location: [facility];  Service: General;  Laterality: Right;   INTRAOPERATIVE TRANSESOPHAGEAL ECHOCARDIOGRAM N/A 11/11/2012   Procedure: INTRAOPERATIVE TRANSESOPHAGEAL ECHOCARDIOGRAM;  Surgeon: Ivin Poot, MD;  Location: [facility];  Service: Open Heart Surgery;  Laterality:  N/A;   LEFT HEART CATHETERIZATION WITH CORONARY ANGIOGRAM N/A 11/07/2012   Procedure: LEFT HEART CATHETERIZATION WITH CORONARY ANGIOGRAM;  Surgeon: Thayer Headings, MD;  Location[facility] C[facility] LAB;  Service: Cardiovascular;  Laterality: N/A;   LUMBAR LAMINECTOMY/DECOMPRESSION MICRODISCECTOMY N/A 02/05/2019   Procedure: LUMBAR LAMINECTOMY/DECOMPRESSION L3-L4;  Surgeon: Latanya Maudlin, MD;  Location: [facility];  Service: Orthopedics;  Laterality: N/A;  48min   LYMPH NODE DISSECTION     Bilateral pelvic   RETROPUBIC PROSTATECTOMY     Radical    Family History  Problem Relation Age of Onset   Aneurysm Father        Cerebral   Stroke Father     Hypertension Father    Cancer Mother        colon   Parkinson's disease Son     Social history:  reports that he has never smoked. He has never used smokeless tobacco. He reports current alcohol use. He reports that he does not use drugs.    Allergies  Allergen Reactions   Crestor [Rosuvastatin] Other (See Comments)    GYNECOMASTIA AND SWELLING    Medications:  Prior to Admission medications   Medication Sig Start Date End Date Taking? Authorizing Provider  ascorbic acid (VITAMIN C) 500 MG tablet Take 1 tablet (500 mg total) by mouth daily. 06/30/20   Barton Dubois, MD  aspirin EC 81 MG tablet Take 81 mg by mouth daily.    [provider]  carbidopa-levodopa (SINEMET IR) 25-100 MG tablet 1/2 tablet three times a day for 4 weeks, then take one tablet three times a day 05/31/20   Kathrynn Ducking, MD  Cholecalciferol (VITAMIN D) 2000 units CAPS Take 2,000 Units by mouth daily.    [provider]  fluticasone (FLONASE) 50 MCG/ACT nasal spray One to 2 sprays each nostril at bedtime Patient taking differently: Place 1-2 sprays into both nostrils daily. 10/12/14   Chipper Herb, MD  furosemide (LASIX) 20 MG tablet Take 1 tablet (20 mg total) by mouth 2 (two) times daily. One at breakfast and another at lunch. 07/15/20   Claretta Fraise, MD  Iron, Ferrous Sulfate, 325 (65 Fe) MG TABS Take 1 tablet by mouth daily. 06/29/20   Barton Dubois, MD  nitroGLYCERIN (NITROSTAT) 0.4 MG SL tablet Place 1 tablet (0.4 mg total) under the tongue every 5 (five) minutes as needed for chest pain. 07/20/15   Erlene Quan, PA-C  pantoprazole (PROTONIX) 40 MG tablet Take 1 tablet (40 mg total) by mouth 2 (two) times daily. TAKE 1 TABLET ONCE DAILY FOR REFLUX 06/29/20   Barton Dubois, MD  triamcinolone cream (KENALOG) 0.1 % Apply 1 application topically daily as needed (itching). 06/29/20   Barton Dubois, MD  zinc sulfate 220 (50 Zn) MG capsule TAKE ONE CAPSULE ONCE DAILY 08/23/20   Claretta Fraise, MD     ROS:  Out of a complete 14 system review of symptoms, the patient complains only of the following symptoms, and all other reviewed systems are negative.  Walking difficulty Right knee pain, back pain Weight loss  Blood pressure 125/70, pulse 76, height 5\' 11"  (1.803 m), weight 148 lb 12.8 oz (67.5 kg).  Physical Exam  General: The patient is alert and cooperative at the time of the examination.  Skin: No significant peripheral edema is noted.   Neurologic Exam  Mental status: The patient is alert and oriented x 3 at the time of the examination. The patient has apparent normal recent and remote memory,  with an apparently normal attention span and concentration ability.   Cranial nerves: Facial symmetry is present. Speech is normal, no aphasia or dysarthria is noted. Extraocular movements are full. Visual fields are full.  Mild masking the face is seen.  Motor: The patient has good strength in all 4 extremities.  Sensory examination: Soft touch sensation is symmetric on the face, arms, and legs.  Coordination: The patient has good finger-nose-finger and heel-to-shin bilaterally.  The patient has slightly sloppy ability to draw a spiral, he has significant difficulty with signing his name.  Gait and station: The patient is able to walk independently, he does shuffle his feet with walking, he has slight flexion of the right arm, decreased arm swing on the right.  Romberg is negative.  Tandem gait was not attempted.  Reflexes: Deep tendon reflexes are symmetric.   MRI brain 11/02/19:  IMPRESSION: 1. No acute intracranial abnormality. 2. Mild chronic small vessel ischemic disease and moderate cerebral atrophy.  * MRI scan images were reviewed online. I agree with the written report.    Assessment/Plan:  1.  Gait disorder, parkinsonism  The patient has been placed on low-dose Sinemet, no significant benefit from this has been seen.  The patient will be increased on  Sinemet taking 1.5 of the 25/100 mg tablet 3 times daily.  He will be set up for physical therapy, he will follow-up here in 4 months, he may see Dr. Rexene Alberts in the future.  I am not completely convinced that he has Parkinson's disease, potentially a DaTscan in the future could be useful.  Jill Alexanders MD 09/21/2020 11:47 AM  [facility] [address] [place], [place] [address]  Phone [phone] Fax [phone]

## 2020-09-22 ENCOUNTER — Telehealth: Payer: Self-pay | Admitting: Family Medicine

## 2020-09-22 NOTE — Telephone Encounter (Signed)
Patient reports he has been feeling very weak today and is concerned that his hemoglobin level has dropped again.  It was last checked one month ago and patient had recently hospitalization.  Appointment scheduled with Je tomorrow at 11:15 am.

## 2020-09-23 ENCOUNTER — Other Ambulatory Visit: Payer: Self-pay

## 2020-09-23 ENCOUNTER — Encounter: Payer: Self-pay | Admitting: Nurse Practitioner

## 2020-09-23 ENCOUNTER — Ambulatory Visit (INDEPENDENT_AMBULATORY_CARE_PROVIDER_SITE_OTHER): Payer: MEDICARE | Admitting: Nurse Practitioner

## 2020-09-23 VITALS — BP 116/63 | HR 78 | Temp 96.9°F | Ht 70.0 in | Wt 149.0 lb

## 2020-09-23 DIAGNOSIS — R531 Weakness: Secondary | ICD-10-CM | POA: Diagnosis not present

## 2020-09-23 DIAGNOSIS — R42 Dizziness and giddiness: Secondary | ICD-10-CM | POA: Insufficient documentation

## 2020-09-23 LAB — COMPREHENSIVE METABOLIC PANEL
ALT: 11 IU/L (ref 0–44)
AST: 20 IU/L (ref 0–40)
Albumin/Globulin Ratio: 1.7 (ref 1.2–2.2)
Albumin: 4.2 g/dL (ref 3.5–4.6)
Alkaline Phosphatase: 84 IU/L (ref 44–121)
BUN/Creatinine Ratio: 13 (ref 10–24)
BUN: 22 mg/dL (ref 10–36)
Bilirubin Total: 0.7 mg/dL (ref 0.0–1.2)
CO2: 24 mmol/L (ref 20–29)
Calcium: 9.4 mg/dL (ref 8.6–10.2)
Chloride: 101 mmol/L (ref 96–106)
Creatinine, Ser: 1.63 mg/dL — ABNORMAL HIGH (ref 0.76–1.27)
Globulin, Total: 2.5 g/dL (ref 1.5–4.5)
Glucose: 107 mg/dL — ABNORMAL HIGH (ref 65–99)
Potassium: 4.3 mmol/L (ref 3.5–5.2)
Sodium: 139 mmol/L (ref 134–144)
Total Protein: 6.7 g/dL (ref 6.0–8.5)
eGFR: 40 mL/min/{1.73_m2} — ABNORMAL LOW (ref >59)

## 2020-09-23 LAB — CBC WITH DIFFERENTIAL/PLATELET
Basophils Absolute: 0 10*3/uL (ref 0.0–0.2)
Basos: 0 %
EOS (ABSOLUTE): 0.1 10*3/uL (ref 0.0–0.4)
Eos: 1 %
Hematocrit: 34.8 % — ABNORMAL LOW (ref 37.5–51.0)
Hemoglobin: 11.4 g/dL — ABNORMAL LOW (ref 13.0–17.7)
Immature Grans (Abs): 0 10*3/uL (ref 0.0–0.1)
Immature Granulocytes: 0 %
Lymphocytes Absolute: 2.5 10*3/uL (ref 0.7–3.1)
Lymphs: 26 %
MCH: 30.4 pg (ref 26.6–33.0)
MCHC: 32.8 g/dL (ref 31.5–35.7)
MCV: 93 fL (ref 79–97)
Monocytes Absolute: 1.1 10*3/uL — ABNORMAL HIGH (ref 0.1–0.9)
Monocytes: 11 %
Neutrophils Absolute: 5.9 10*3/uL (ref 1.4–7.0)
Neutrophils: 62 %
Platelets: 272 10*3/uL (ref 150–450)
RBC: 3.75 x10E6/uL — ABNORMAL LOW (ref 4.14–5.80)
RDW: 15.8 % — ABNORMAL HIGH (ref 11.6–15.4)
WBC: 9.5 10*3/uL (ref 3.4–10.8)

## 2020-09-23 NOTE — Patient Instructions (Signed)
Weakness Weakness is a lack of strength. You may feel weak all over your body (generalized), or you may feel weak in one part of your body (focal). There are many potential causes of weakness. Sometimes, the cause of your weakness may not be known. Some causes of weakness can be serious, so it isimportant to see your doctor. Follow these instructions at home: Activity Rest as needed. Try to get enough sleep. Most adults need 7-8 hours of sleep each night. Talk to your doctor about how much sleep you need each night. Do exercises, such as arm curls and leg raises, for 30 minutes at least 2 days a week or as told by your doctor. Think about working with a physical therapist or trainer to help you get stronger. General instructions  Take over-the-counter and prescription medicines only as told by your doctor. Eat a healthy, well-balanced diet. This includes: Proteins to build muscles, such as lean meats and fish. Fresh fruits and vegetables. Carbohydrates to boost energy, such as whole grains. Drink enough fluid to keep your pee (urine) pale yellow. Keep all follow-up visits as told by your doctor. This is important.  Contact a doctor if: Your weakness does not get better or it gets worse. Your weakness affects your ability to: Think clearly. Do your normal daily activities. Get help right away if you: Have sudden weakness on one side of your face or body. Have chest pain. Have trouble breathing or shortness of breath. Have problems with your vision. Have trouble talking or swallowing. Have trouble standing or walking. Are light-headed. Pass out (lose consciousness). Summary Weakness is a lack of strength. You may feel weak all over your body or just in one part of your body. There are many potential causes of weakness. Sometimes, the cause of your weakness may not be known. Rest as needed, and try to get enough sleep. Most adults need 7-8 hours of sleep each night. Eat a healthy,  well-balanced diet. This information is not intended to replace advice given to you by your health care provider. Make sure you discuss any questions you have with your healthcare provider. Document Revised: 10/09/2017 Document Reviewed: 10/09/2017 Elsevier Patient Education  [place]. Dizziness Dizziness is a common problem. It makes you feel unsteady or light-headed. You may feel like you are about to pass out (faint). Dizziness can lead to getting hurt if you stumble or fall. Dizziness can be caused by many things, including: Medicines. Not having enough water in your body (dehydration). Illness. Follow these instructions at home: Eating and drinking  Drink enough fluid to keep your pee (urine) pale yellow. This helps to keep you from getting dehydrated. Try to drink more clear fluids, such as water. Do not drink alcohol. Limit how much caffeine you drink or eat, if your doctor tells you to do that. Limit how much salt (sodium) you drink or eat, if your doctor tells you to do that.  Activity  Avoid making quick movements. Stand up slowly from sitting in a chair, and steady yourself until you feel okay. In the morning, first sit up on the side of the bed. When you feel okay, stand up slowly while you hold onto something. Do this until you know that your balance is okay. If you need to stand in one place for a long time, move your legs often. Tighten and relax the muscles in your legs while you are standing. Do not drive or use machinery if you feel dizzy. Avoid bending  down if you feel dizzy. Place items in your home so you can reach them easily without leaning over.  Lifestyle Do not smoke or use any products that contain nicotine or tobacco. If you need help quitting, ask your doctor. Try to lower your stress level. You can do this by using methods such as yoga or meditation. Talk with your doctor if you need help. General instructions Watch your dizziness for any  changes. Take over-the-counter and prescription medicines only as told by your doctor. Talk with your doctor if you think that you are dizzy because of a medicine that you are taking. Tell a friend or a family member that you are feeling dizzy. If he or she notices any changes in your behavior, have this person call your doctor. Keep all follow-up visits. Contact a doctor if: Your dizziness does not go away. Your dizziness or light-headedness gets worse. You feel like you may vomit (are nauseous). You have trouble hearing. You have new symptoms. You are unsteady on your feet. You feel like the room is spinning. You have neck pain or a stiff neck. You have a fever. Get help right away if: You vomit or have watery poop (diarrhea), and you cannot eat or drink anything. You have trouble: Talking. Walking. Swallowing. Using your arms, hands, or legs. You feel generally weak. You are not thinking clearly, or you have trouble forming sentences. A friend or family member may notice this. You have: Chest pain. Pain in your belly (abdomen). Shortness of breath. Sweating. Your vision changes. You are bleeding. You have a very bad headache. These symptoms may be an emergency. Get help right away. Call your local emergency services (911 in the [place] Do not wait to see if the symptoms will go away. Do not drive yourself to the hospital. Summary Dizziness makes you feel unsteady or light-headed. You may feel like you are about to pass out (faint). Drink enough fluid to keep your pee (urine) pale yellow. Do not drink alcohol. Avoid making quick movements if you feel dizzy. Watch your dizziness for any changes. This information is not intended to replace advice given to you by your health care provider. Make sure you discuss any questions you have with your healthcare provider. Document Revised: 02/08/2020 Document Reviewed: 02/08/2020 Elsevier Patient Education  2022 [employer].

## 2020-09-23 NOTE — Assessment & Plan Note (Signed)
Symptoms are worse in the last few days, completed assessment, patient is dehydrated, encouraged to increase hydration. Completed CBC/CMP, follow up with worsening unresolved symptom.

## 2020-09-23 NOTE — Assessment & Plan Note (Signed)
Ongoing weakness associated with dizziness and orthostatic hypotension. This symptoms are not new for patient. On assessment, slow capillary reflex, dry lips and skin. Patient looks dehydrated and admitted to drinking only 16 oz of water a day. Provided education to patient on the dangers of dehydration and to increase hydration. Rise from sitting position slowly, and follow up with worsening unresolved symptoms. Completed cbc and CMP results pending to assess low hemoglobin and electrolyte

## 2020-09-23 NOTE — Progress Notes (Signed)
Acute Office Visit  Subjective:    Patient ID: Jon Reid, male    DOB: 12-Sep-1928, 87 y.o.   MRN: [id number]  Chief Complaint  Patient presents with   Extremity Weakness   Dizziness    Dizziness This is a recurrent problem. The current episode started in the past 7 days. The problem occurs 2 to 4 times per day. The problem has been gradually worsening. Associated symptoms include vertigo and weakness. Pertinent negatives include no congestion, coughing, fever, headaches, numbness or visual change. The symptoms are aggravated by walking. He has tried nothing for the symptoms.  Extremity Weakness  The pain is present in the left hand, right hand, right lower leg and left lower leg. This is a recurrent problem. The current episode started in the past 7 days. There has been no history of extremity trauma. The problem occurs intermittently. The problem has been unchanged. The patient is experiencing no pain. Pertinent negatives include no fever or numbness. The symptoms are aggravated by activity. He has tried nothing for the symptoms. His past medical history is significant for rheumatoid arthritis.    Past Medical History:  Diagnosis Date   Arthritis    Carcinoma of prostate ([place])    prostate   CKD (chronic kidney disease), stage III (HCC)    Coronary artery disease    a. 10/2012 Cath: LM 20-30d, lAD 80p, 26m 80-90d, LCX 90-972mOM1 90, RCA 50p, 2024m0d, RPDA 90, EF nl; b. 10/2012 CABG x 4 (LIMA-LAD, SVG-OM1, SVG-PDA->RPL); c. 07/2015 MV: Basal and proximal septal infarct w/o ischemia. EF 57%-->low risk.   First degree AV block 01/28/2019   Noted on EKG   GERD (gastroesophageal reflux disease)    Gout    HTN (hypertension)    Macrocytosis without anemia 01/08/2012   Moderate aortic stenosis    a. 10/2012 Echo: EF 55-60%, no rwma, BAE, mild AI, mild TR; b. 06/2020 Echo: EF 50-55%, no rwma, mild LVH. Mod dil LA. Mild MR. Mod AS.   [name] affect 02/09/2020   Right BBB/left ant  fasc block 01/28/2019   Noted on EKG   S/P CABG x 4 02/02/2013   Sleep apnea    Questionable   Spinal stenosis    [facility]   Past Surgical History:  Procedure Laterality Date   CARDIAC CATHETERIZATION  11/07/2012   Dr NahAcie FredricksonCATARACT EXTRACTION W/ INTRAOCULAR LENS IMPLANT Bilateral    CORONARY ARTERY BYPASS GRAFT N/A 11/11/2012   Procedure: CORONARY ARTERY BYPASS GRAFTING times four using Right Greater Saphenous Vein Graft harvested endoscopically and Left Internal Mammary Artery.;  Surgeon: PetIvin PootD;  [facility];  Laterality: N/A;   ESOPHAGOGASTRODUODENOSCOPY (EGD) WITH PROPOFOL N/A 06/27/2020   Surgeon: CarEloise HarmanO;  large hiatal hernia, LA grade D esophagitis with no bleeding, normal examined duodenum.    INGUINAL HERNIA REPAIR Right 01/10/2017   Procedure: OPEN RIGHT HERNIA REPAIR INGUINAL;  Surgeon: WhiIleana RoupD;  [facility];  Service: General;  Laterality: Right;   INSERTION OF MESH Right 01/10/2017   Procedure: INSERTION OF MESH;  Surgeon: WhiIleana RoupD;  [facility];  Service: General;  Laterality: Right;   INTRAOPERATIVE TRANSESOPHAGEAL ECHOCARDIOGRAM N/A 11/11/2012   Procedure: INTRAOPERATIVE TRANSESOPHAGEAL ECHOCARDIOGRAM;  Surgeon: PetIvin PootD;  [facility];  Laterality: N/A;   LEFT HEART CATHETERIZATION WITH CORONARY ANGIOGRAM N/A 11/07/2012   Procedure:  LEFT HEART CATHETERIZATION WITH CORONARY ANGIOGRAM;  Surgeon: Thayer Headings, MD;  Location: [facility] CATH LAB;  Service: Cardiovascular;  Laterality: N/A;   LUMBAR LAMINECTOMY/DECOMPRESSION MICRODISCECTOMY N/A 02/05/2019   Procedure: LUMBAR LAMINECTOMY/DECOMPRESSION L3-L4;  Surgeon: Latanya Maudlin, MD;  [facility];  Service: Orthopedics;  Laterality: N/A;  31mn   LYMPH NODE DISSECTION     Bilateral pelvic   RETROPUBIC PROSTATECTOMY     Radical    Family History  Problem Relation  Age of Onset   Aneurysm Father        Cerebral   Stroke Father    Hypertension Father    Cancer Mother        colon   Parkinson's disease Son     Social History   Socioeconomic History   Marital status: Married    Spouse name: [name]  Number of children: 3   Years of education: 12   Highest education level: High school graduate  Occupational History   Occupation: Retired  Tobacco Use   Smoking status: Never   Smokeless tobacco: Never  VScientific laboratory technicianUse: Never used  Substance and Sexual Activity   Alcohol use: Yes    Comment: Rare   Drug use: No   Sexual activity: Not Currently  Other Topics Concern   Not on file  Social History Narrative   Lives at home with wife/sons will stay with them occassionally   Married with 3 children   Denies caffeine use    Social Determinants of HRadio broadcast assistantStrain: Not on file  Food Insecurity: Not on file  Transportation Needs: Not on file  Physical Activity: Not on file  Stress: Not on file  Social Connections: Not on file  Intimate Partner Violence: Not on file    Outpatient Medications Prior to Visit  Medication Sig Dispense Refill   ascorbic acid (VITAMIN C) 500 MG tablet Take 1 tablet (500 mg total) by mouth daily. 30 tablet 1   aspirin EC 81 MG tablet Take 81 mg by mouth daily.     carbidopa-levodopa (SINEMET IR) 25-100 MG tablet Take 1.5 tablets by mouth 3 (three) times daily. 405 tablet 3   Cholecalciferol (VITAMIN D) 2000 units CAPS Take 2,000 Units by mouth daily.     fluticasone (FLONASE) 50 MCG/ACT nasal spray One to 2 sprays each nostril at bedtime (Patient taking differently: Place 1-2 sprays into both nostrils daily.) 16 g 6   furosemide (LASIX) 40 MG tablet Take 40 mg by mouth daily.     Iron, Ferrous Sulfate, 325 (65 Fe) MG TABS Take 1 tablet by mouth daily. 30 tablet 3   nitroGLYCERIN (NITROSTAT) 0.4 MG SL tablet Place 1 tablet (0.4 mg total) under the tongue every 5 (five) minutes as needed  for chest pain. 25 tablet PRN   pantoprazole (PROTONIX) 40 MG tablet Take 1 tablet (40 mg total) by mouth 2 (two) times daily. TAKE 1 TABLET ONCE DAILY FOR REFLUX 60 tablet 2   triamcinolone cream (KENALOG) 0.1 % Apply 1 application topically daily as needed (itching).     zinc sulfate 220 (50 Zn) MG capsule TAKE ONE CAPSULE ONCE DAILY 30 capsule 1   No facility-administered medications prior to visit.    Allergies  Allergen Reactions   Crestor [Rosuvastatin] Other (See Comments)    GYNECOMASTIA AND SWELLING    Review of Systems  Constitutional: Negative.  Negative for fever.  HENT: Negative.  Negative for congestion.   Respiratory: Negative.  Negative for cough.   Musculoskeletal:  Positive for extremity weakness.  Neurological:  Positive for dizziness, vertigo and weakness. Negative for numbness and headaches.  All other systems reviewed and are negative.     Objective:    Physical Exam Vitals reviewed.  Constitutional:      Appearance: Normal appearance.  HENT:     Nose: Nose normal.  Eyes:     Conjunctiva/sclera: Conjunctivae normal.  Cardiovascular:     Rate and Rhythm: Normal rate.  Abdominal:     General: Bowel sounds are normal.  Musculoskeletal:        General: Normal range of motion.  Skin:    Findings: No rash.  Neurological:     Mental Status: He is alert and oriented to person, place, and time.     Motor: Weakness present.  Psychiatric:        Mood and Affect: Mood normal.        Behavior: Behavior normal.    BP 116/63   Pulse 78   Temp (!) 96.9 F (36.1 C) (Temporal)   Ht 5' 10"  (1.778 m)   Wt 149 lb (67.6 kg)   SpO2 96%   BMI 21.38 kg/m  Wt Readings from Last 3 Encounters:  09/23/20 149 lb (67.6 kg)  09/21/20 148 lb 12.8 oz (67.5 kg)  08/19/20 147 lb 6.4 oz (66.9 kg)    Health Maintenance Due  Topic Date Due   Zoster Vaccines- Shingrix (1 of 2) Never done   COVID-19 Vaccine (3 - Moderna risk series) 07/28/2019    There are no  preventive care reminders to display for this patient.   Lab Results  Component Value Date   TSH 1.330 08/17/2020   Lab Results  Component Value Date   WBC 9.5 09/23/2020   HGB 11.4 (L) 09/23/2020   HCT 34.8 (L) 09/23/2020   MCV 93 09/23/2020   PLT 272 09/23/2020   Lab Results  Component Value Date   NA 139 09/23/2020   K 4.3 09/23/2020   CO2 24 09/23/2020   GLUCOSE 107 (H) 09/23/2020   BUN 22 09/23/2020   CREATININE 1.63 (H) 09/23/2020   BILITOT 0.7 09/23/2020   ALKPHOS 84 09/23/2020   AST 20 09/23/2020   ALT 11 09/23/2020   PROT 6.7 09/23/2020   ALBUMIN 4.2 09/23/2020   CALCIUM 9.4 09/23/2020   ANIONGAP 10 07/25/2020   EGFR 40 (L) 09/23/2020   GFR 59.83 (L) 12/10/2012   Lab Results  Component Value Date   CHOL 159 05/27/2019   Lab Results  Component Value Date   HDL 57 05/27/2019   Lab Results  Component Value Date   LDLCALC 89 05/27/2019   Lab Results  Component Value Date   TRIG 64 05/27/2019   Lab Results  Component Value Date   CHOLHDL 2.8 05/27/2019   Lab Results  Component Value Date   HGBA1C 5.5% 05/31/2014       Assessment & Plan:   Problem List Items Addressed This Visit       Other   Weakness - Primary    Ongoing weakness associated with dizziness and orthostatic hypotension. This symptoms are not new for patient. On assessment, slow capillary reflex, dry lips and skin. Patient looks dehydrated and admitted to drinking only 16 oz of water a day. Provided education to patient on the dangers of dehydration and to increase hydration. Rise from sitting position slowly, and follow up with worsening unresolved symptoms. Completed cbc and CMP results pending  to assess low hemoglobin and electrolyte       Relevant Orders   CBC with Differential (Completed)   Comprehensive metabolic panel (Completed)   Dizziness    Symptoms are worse in the last few days, completed assessment, patient is dehydrated, encouraged to increase hydration.  Completed CBC/CMP, follow up with worsening unresolved symptom.       Relevant Orders   CBC with Differential (Completed)   Comprehensive metabolic panel (Completed)     No orders of the defined types were placed in this encounter.    Ivy Lynn, NP

## 2020-09-28 ENCOUNTER — Ambulatory Visit: Payer: MEDICARE | Attending: Neurology

## 2020-09-28 ENCOUNTER — Other Ambulatory Visit: Payer: Self-pay

## 2020-09-28 DIAGNOSIS — R262 Difficulty in walking, not elsewhere classified: Secondary | ICD-10-CM

## 2020-09-28 DIAGNOSIS — R296 Repeated falls: Secondary | ICD-10-CM

## 2020-09-28 DIAGNOSIS — M25561 Pain in right knee: Secondary | ICD-10-CM | POA: Diagnosis not present

## 2020-09-28 DIAGNOSIS — M6281 Muscle weakness (generalized): Secondary | ICD-10-CM | POA: Insufficient documentation

## 2020-09-28 DIAGNOSIS — G8929 Other chronic pain: Secondary | ICD-10-CM | POA: Diagnosis not present

## 2020-09-28 DIAGNOSIS — R600 Localized edema: Secondary | ICD-10-CM | POA: Diagnosis not present

## 2020-09-28 DIAGNOSIS — R2681 Unsteadiness on feet: Secondary | ICD-10-CM

## 2020-09-28 NOTE — Therapy (Signed)
[facility] [place], [place], [address] Phone: [phone]   Fax:  [phone]  Physical Therapy Evaluation  Patient Details  Name: Jon Reid MRN: [id number] Date of Birth: November 22, 1928 Referring Provider (PT): Margette Fast   Encounter Date: 09/28/2020    Past Medical History:  Diagnosis Date   Arthritis    Carcinoma of prostate [facility])    prostate   CKD (chronic kidney disease), stage III ([place])    Coronary artery disease    a. 10/2012 Cath: LM 20-30d, lAD 80p, 27m, 80-90d, LCX 90-50m, OM1 90, RCA 50p, 42m, 50d, RPDA 90, EF nl; b. 10/2012 CABG x 4 (LIMA-LAD, SVG-OM1, SVG-PDA->RPL); c. 07/2015 MV: Basal and proximal septal infarct w/o ischemia. EF 57%-->low risk.   First degree AV block 01/28/2019   Noted on EKG   GERD (gastroesophageal reflux disease)    Gout    HTN (hypertension)    Macrocytosis without anemia 01/08/2012   Moderate aortic stenosis    a. 10/2012 Echo: EF 55-60%, no rwma, BAE, mild AI, mild TR; b. 06/2020 Echo: EF 50-55%, no rwma, mild LVH. Mod dil LA. Mild MR. Mod AS.   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   Sleep apnea    Questionable   Spinal stenosis    Stroke [facility])     Past Surgical History:  Procedure Laterality Date   CARDIAC CATHETERIZATION  11/07/2012   Dr Acie Fredrickson   CATARACT EXTRACTION W/ INTRAOCULAR LENS IMPLANT Bilateral    CORONARY ARTERY BYPASS GRAFT N/A 11/11/2012   Procedure: CORONARY ARTERY BYPASS GRAFTING times four using Right Greater Saphenous Vein Graft harvested endoscopically and Left Internal Mammary Artery.;  Surgeon: Ivin Poot, MD;  Location: [place];  Service: Open Heart Surgery;  Laterality: N/A;   ESOPHAGOGASTRODUODENOSCOPY (EGD) WITH PROPOFOL N/A 06/27/2020   Surgeon: [name], DO;  large hiatal hernia, LA grade D esophagitis with no bleeding, normal examined duodenum.    INGUINAL  HERNIA REPAIR Right 01/10/2017   Procedure: OPEN RIGHT HERNIA REPAIR INGUINAL;  Surgeon: Ileana Roup, MD;  [facility];  Service: General;  Laterality: Right;   INSERTION OF MESH Right 01/10/2017   Procedure: INSERTION OF MESH;  Surgeon: Ileana Roup, MD;  [facility];  Service: General;  Laterality: Right;   INTRAOPERATIVE TRANSESOPHAGEAL ECHOCARDIOGRAM N/A 11/11/2012   Procedure: INTRAOPERATIVE TRANSESOPHAGEAL ECHOCARDIOGRAM;  Surgeon: Ivin Poot, MD;  Location: [place];  Service: Open Heart Surgery;  Laterality: N/A;   LEFT HEART CATHETERIZATION WITH CORONARY ANGIOGRAM N/A 11/07/2012   Procedure: LEFT HEART CATHETERIZATION WITH CORONARY ANGIOGRAM;  Surgeon: Thayer Headings, MD;  Location: [facility] C[facility] LAB;  Service: Cardiovascular;  Laterality: N/A;   LUMBAR LAMINECTOMY/DECOMPRESSION MICRODISCECTOMY N/A 02/05/2019   Procedure: LUMBAR LAMINECTOMY/DECOMPRESSION L3-L4;  Surgeon: Latanya Maudlin, MD;  [facility];  Service: Orthopedics;  Laterality: N/A;  75min   LYMPH NODE DISSECTION     Bilateral pelvic   RETROPUBIC PROSTATECTOMY     Radical      09/28/20 1311  Symptoms/Limitations  Subjective Patient reports to OPPT with history of repeated falls and shuffling of feet secondary to Parkinson's. He reports that he fell as his foot was stuck in the metal rail at the hospital. He reports that he gets around well at home but may need some help with his legs as they are not as strong as they once were.  Per charted records, patient was hospitalized 4/09 due to GI bleed and abnormal WBC count - he reports he is uncertain why that occured. 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  Pertinent History CKD, CAD, HTN, history of stroke, CABG x4, lumbar laminectomy/decompression microdiscectomy 02/05/2019  Limitations Sitting;Standing;Walking  Pain Assessment  Currently in Pain? Yes  Pain Score 8  Pain Location  Knee  Pain Orientation Right  Pain Descriptors / Indicators Aching  Pain Type Chronic pain  Pain Onset More than a month ago  Pain Frequency Constant  Aggravating Factors  walking and standing a long time       09/28/20 1300  Assessment  Referring Provider (PT) Margette Fast  Precautions  Precautions Fall  Balance Screen  Has the patient fallen in the past 6 months Yes  How many times? 10  Has the patient had a decrease in activity level because of a fear of falling?  No  Is the patient reluctant to leave their home because of a fear of falling?  No  Home Environment  Living Environment Private residence  Home Layout Two level  Alternate Level Stairs-Number of Steps 16  Alternate Level Stairs-Rails Can reach both  Prior Function  Level of Independence Independent  Cognition  Overall Cognitive Status Within Functional Limits for tasks assessed  Sensation  Light Touch Appears Intact  Posture/Postural Control  Posture/Postural Control Postural limitations  Postural Limitations Rounded Shoulders;Forward head;Flexed trunk;Increased thoracic kyphosis  ROM / Strength  AROM / PROM / Strength Strength  Strength  Strength Assessment Site Hip;Knee  Right/Left Hip Right;Left  Right Hip Flexion 4/5  Right Hip Extension 4-/5  Right Hip ABduction 3/5  Left Hip Flexion 4/5  Left Hip Extension 3/5  Left Hip ABduction 4/5  Left Hip ADduction 4+/5  Right Hip External Rotation  3/5  Right Hip Internal Rotation 4/5  Left Hip External Rotation 4/5  Left Hip Internal Rotation 4/5  Flexibility  Soft Tissue Assessment /Muscle Length y  Hamstrings WFL  Palpation  Patella mobility TTP on the R (reports show history of torn meniscus)  Transfers  Transfers Sit to Stand  Five time sit to stand comments  stands in trunk flexion - requires VCs for upright posture  Ambulation/Gait  Ambulation/Gait Yes  Ambulation/Gait Assistance 6: Modified independent (Device/Increase time)  Assistive  device  (recommendations for SPC)  6 minute walk test results   Endurance additional comments TBA  Standardized Balance Assessment  Standardized Balance Assessment TUG  High Level Balance  High Level Balance Activites Backward walking  High Level Balance Comments limited - shuffling reqiuires CGA     Objective measurements completed on examination: See above findings.       PT Long Term Goals - 09/30/20 0802       PT LONG TERM GOAL #1   Title Patient will be independent with HEP.    Time 4      PT LONG TERM GOAL #2   Title Patient will perform 5x sit to stand test in 15 seconds or less with no UE support to improve balance and improve functional LE strength.    Time 4    Period Weeks      PT LONG TERM GOAL #3   Title Patient will decrease risk of falls as noted by a 4+ improvement on Berg Balance Scale.    Baseline TBD    Time 4  Period Weeks      PT LONG TERM GOAL #4   Title Patient will demonstrate  4+/5 or greater bilateral LE MMT to improve stability during functional tasks.             Patient will benefit from skilled therapeutic intervention in order to improve the following deficits and impairments:  Abnormal gait, Decreased balance, Decreased endurance, Decreased mobility, Impaired vision/preception, Increased edema, Decreased range of motion, Decreased activity tolerance, Impaired flexibility, Postural dysfunction, Pain, Decreased strength  Visit Diagnosis: Difficulty in walking, not elsewhere classified  Repeated falls  Unsteadiness on feet     Problem List Patient Active Problem List   Diagnosis Date Noted   Dizziness 09/23/2020   History of GI bleed 08/19/2020   Loss of weight 08/19/2020   Dysphagia 08/19/2020   Upper GI bleed 06/25/2020   COVID-19 virus infection 06/25/2020   [name] affect 02/09/2020   Iron deficiency anemia due to chronic blood loss 05/27/2019   Nonrheumatic mitral valve regurgitation 05/26/2019   Left  anterior fascicular block 05/26/2019   Educated about COVID-19 virus infection 04/06/2019   Constipation 02/19/2019   Elevated troponin 02/09/2019   Fecal occult blood test positive 02/09/2019   Spinal stenosis, lumbar region with neurogenic claudication 02/05/2019   Leg swelling 01/26/2019   Nonrheumatic aortic valve stenosis 01/26/2019   Multiple joint pain 12/11/2018   Chronic pain of both knees 11/07/2018   Vitamin D deficiency 10/23/2018   Gastroesophageal reflux disease with esophagitis 03/13/2018   Murmur, cardiac 12/04/2017   Coronary artery disease involving native coronary artery of native heart without angina pectoris 12/04/2017   PVC's (premature ventricular contractions) 12/04/2017   Heart disease 08/21/2017   Weakness 11/14/2016   Abnormality of gait 12/06/2015   Abdominal aortic atherosclerosis ([place]) 12/01/2015   Degeneration of lumbar intervertebral disc 12/01/2015   Exertional angina ([place]) 07/20/2015   Hernia of abdominal wall 10/12/2014   Gynecomastia 04/09/2013   Hx of CABG 02/02/2013   Myelodysplastic syndrome 11/06/2012   Vitamin B12 deficiency 07/07/2012   Hyperlipemia 07/07/2012   Peripheral vascular disease ([place]) 04/14/2009   CARCINOMA, PROSTATE 04/13/2009   Non-thrombocytopenic purpura ([place]) 04/13/2009   Benign essential hypertension 04/13/2009   Chronic renal insufficiency, stage III (moderate) ([place]) 04/13/2009   SLEEP APNEA 04/13/2009    Marylou Mccoy 09/30/2020, 8:06 AM  [facility] [address] [place], [place], [address] Phone: [phone]   Fax:  [phone]  Name: OBDULIO MASH MRN: [id number] Date of Birth: February 27, 1929

## 2020-09-28 NOTE — Patient Instructions (Signed)
Jon Reid  09/28/2020     @PREFPERIOPPHARMACY @   Your procedure is scheduled on  10/03/2020.   Report to [facility] at 1200  P.M.   Call this number if you have problems the morning of surgery:  [phone]   Remember:  Follow the diet instructions given to you by the office.    Take these medicines the morning of surgery with A SIP OF WATER        sinemet.     Do not wear jewelry, make-up or nail polish.  Do not wear lotions, powders, or perfumes, or deodorant.  Do not shave 48 hours prior to surgery.  Men may shave face and neck.  Do not bring valuables to the hospital.  The [facility] is not responsible for any belongings or valuables.  Contacts, dentures or bridgework may not be worn into surgery.  Leave your suitcase in the car.  After surgery it may be brought to your room.  For patients admitted to the hospital, discharge time will be determined by your treatment team.  Patients discharged the day of surgery will not be allowed to drive home and must have someone with them for 24 hours.    Special instructions:      DO NOT smoke tobacco or vape for 24 hours before your procedure.  Please read over the following fact sheets that you were given. Anesthesia Post-op Instructions and Care and Recovery After Surgery      Upper Endoscopy, Adult, Care After This sheet gives you information about how to care for yourself after your procedure. Your health care provider may also give you more specific instructions. If you have problems or questions, contact your health careprovider. What can I expect after the procedure? After the procedure, it is common to have: A sore throat. Mild stomach pain or discomfort. Bloating. Nausea. Follow these instructions at home:  Follow instructions from your health care provider about what to eat or drink after your procedure. Return to your normal activities as told by your health care provider. Ask your health care  provider what activities are safe for you. Take over-the-counter and prescription medicines only as told by your health care provider. If you were given a sedative during the procedure, it can affect you for several hours. Do not drive or operate machinery until your health care provider says that it is safe. Keep all follow-up visits as told by your health care provider. This is important. Contact a health care provider if you have: A sore throat that lasts longer than one day. Trouble swallowing. Get help right away if: You vomit blood or your vomit looks like coffee grounds. You have: A fever. Bloody, black, or tarry stools. A severe sore throat or you cannot swallow. Difficulty breathing. Severe pain in your chest or abdomen. Summary After the procedure, it is common to have a sore throat, mild stomach discomfort, bloating, and nausea. If you were given a sedative during the procedure, it can affect you for several hours. Do not drive or operate machinery until your health care provider says that it is safe. Follow instructions from your health care provider about what to eat or drink after your procedure. Return to your normal activities as told by your health care provider. This information is not intended to replace advice given to you by your health care provider. Make sure you discuss any questions you have with your healthcare provider. Document Revised: 03/03/2019  Document Reviewed: 08/05/2017 Elsevier Patient Education  2022 [place]. https://www.asge.org/home/for-patients/patient-information/understanding-eso-dilation-updated">  Esophageal Dilatation Esophageal dilatation, also called esophageal dilation, is a procedure to widen or open a blocked or narrowed part of the esophagus. The esophagus is the part of the body that moves food and liquid from the mouth to the stomach. You may need this procedure if: You have a buildup of scar tissue in your esophagus that makes it  difficult, painful, or impossible to swallow. This can be caused by gastroesophageal reflux disease (GERD). You have cancer of the esophagus. There is a problem with how food moves through your esophagus. In some cases, you may need this procedure repeated at a later time to dilatethe esophagus gradually. Tell a health care provider about: Any allergies you have. All medicines you are taking, including vitamins, herbs, eye drops, creams, and over-the-counter medicines. Any problems you or family members have had with anesthetic medicines. Any blood disorders you have. Any surgeries you have had. Any medical conditions you have. Any antibiotic medicines you are required to take before dental procedures. Whether you are pregnant or may be pregnant. What are the risks? Generally, this is a safe procedure. However, problems may occur, including: Bleeding due to a tear in the lining of the esophagus. A hole, or perforation, in the esophagus. What happens before the procedure? Ask your health care provider about: Changing or stopping your regular medicines. This is especially important if you are taking diabetes medicines or blood thinners. Taking medicines such as aspirin and ibuprofen. These medicines can thin your blood. Do not take these medicines unless your health care provider tells you to take them. Taking over-the-counter medicines, vitamins, herbs, and supplements. Follow instructions from your health care provider about eating or drinking restrictions. Plan to have a responsible adult take you home from the hospital or clinic. Plan to have a responsible adult care for you for the time you are told after you leave the hospital or clinic. This is important. What happens during the procedure? You may be given a medicine to help you relax (sedative). A numbing medicine may be sprayed into the back of your throat, or you may gargle the medicine. Your health care provider may perform the  dilatation using various surgical instruments, such as: Simple dilators. This instrument is carefully placed in the esophagus to stretch it. Guided wire bougies. This involves using an endoscope to insert a wire into the esophagus. A dilator is passed over this wire to enlarge the esophagus. Then the wire is removed. Balloon dilators. An endoscope with a small balloon is inserted into the esophagus. The balloon is inflated to stretch the esophagus and open it up. The procedure may vary among health care providers and hospitals. What can I expect after the procedure? Your blood pressure, heart rate, breathing rate, and blood oxygen level will be monitored until you leave the hospital or clinic. Your throat may feel slightly sore and numb. This will get better over time. You will not be allowed to eat or drink until your throat is no longer numb. When you are able to drink, urinate, and sit on the edge of the bed without nausea or dizziness, you may be able to return home. Follow these instructions at home: Take over-the-counter and prescription medicines only as told by your health care provider. If you were given a sedative during the procedure, it can affect you for several hours. Do not drive or operate machinery until your health care provider says that  it is safe. Plan to have a responsible adult care for you for the time you are told. This is important. Follow instructions from your health care provider about any eating or drinking restrictions. Do not use any products that contain nicotine or tobacco, such as cigarettes, e-cigarettes, and chewing tobacco. If you need help quitting, ask your health care provider. Keep all follow-up visits. This is important. Contact a health care provider if: You have a fever. You have pain that is not relieved by medicine. Get help right away if: You have chest pain. You have trouble breathing. You have trouble swallowing. You vomit blood. You have  black, tarry, or bloody stools. These symptoms may represent a serious problem that is an emergency. Do not wait to see if the symptoms will go away. Get medical help right away. Call your local emergency services (911 in the [place] Do not drive yourself to the hospital. Summary Esophageal dilatation, also called esophageal dilation, is a procedure to widen or open a blocked or narrowed part of the esophagus. Plan to have a responsible adult take you home from the hospital or clinic. For this procedure, a numbing medicine may be sprayed into the back of your throat, or you may gargle the medicine. Do not drive or operate machinery until your health care provider says that it is safe. This information is not intended to replace advice given to you by your health care provider. Make sure you discuss any questions you have with your healthcare provider. Document Revised: 07/22/2019 Document Reviewed: 07/22/2019 Elsevier Patient Education  [facility] After This sheet gives you information about how to care for yourself after your procedure. Your health care provider may also give you more specific instructions. If you have problems or questions, contact your health careprovider. What can I expect after the procedure? After the procedure, it is common to have: Tiredness. Forgetfulness about what happened after the procedure. Impaired judgment for important decisions. Nausea or vomiting. Some difficulty with balance. Follow these instructions at home: For the time period you were told by your health care provider:     Rest as needed. Do not participate in activities where you could fall or become injured. Do not drive or use machinery. Do not drink alcohol. Do not take sleeping pills or medicines that cause drowsiness. Do not make important decisions or sign legal documents. Do not take care of children on your own. Eating and drinking Follow the  diet that is recommended by your health care provider. Drink enough fluid to keep your urine pale yellow. If you vomit: Drink water, juice, or soup when you can drink without vomiting. Make sure you have little or no nausea before eating solid foods. General instructions Have a responsible adult stay with you for the time you are told. It is important to have someone help care for you until you are awake and alert. Take over-the-counter and prescription medicines only as told by your health care provider. If you have sleep apnea, surgery and certain medicines can increase your risk for breathing problems. Follow instructions from your health care provider about wearing your sleep device: Anytime you are sleeping, including during daytime naps. While taking prescription pain medicines, sleeping medicines, or medicines that make you drowsy. Avoid smoking. Keep all follow-up visits as told by your health care provider. This is important. Contact a health care provider if: You keep feeling nauseous or you keep vomiting. You feel light-headed. You are still  sleepy or having trouble with balance after 24 hours. You develop a rash. You have a fever. You have redness or swelling around the IV site. Get help right away if: You have trouble breathing. You have new-onset confusion at home. Summary For several hours after your procedure, you may feel tired. You may also be forgetful and have poor judgment. Have a responsible adult stay with you for the time you are told. It is important to have someone help care for you until you are awake and alert. Rest as told. Do not drive or operate machinery. Do not drink alcohol or take sleeping pills. Get help right away if you have trouble breathing, or if you suddenly become confused. This information is not intended to replace advice given to you by your health care provider. Make sure you discuss any questions you have with your healthcare  provider. Document Revised: 11/19/2019 Document Reviewed: 02/05/2019 Elsevier Patient Education  2022 [employer].

## 2020-09-30 ENCOUNTER — Encounter (HOSPITAL_COMMUNITY)
Admission: RE | Admit: 2020-09-30 | Discharge: 2020-09-30 | Disposition: A | Discharge status: Home / Self Care | Payer: MEDICARE | Source: Ambulatory Visit | Attending: Internal Medicine | Admitting: Internal Medicine

## 2020-09-30 ENCOUNTER — Encounter (HOSPITAL_COMMUNITY): Payer: Self-pay

## 2020-09-30 ENCOUNTER — Other Ambulatory Visit: Payer: Self-pay

## 2020-09-30 ENCOUNTER — Encounter: Payer: Self-pay | Admitting: Physical Therapy

## 2020-09-30 ENCOUNTER — Ambulatory Visit: Payer: MEDICARE | Admitting: Physical Therapy

## 2020-09-30 DIAGNOSIS — R296 Repeated falls: Secondary | ICD-10-CM

## 2020-09-30 DIAGNOSIS — R262 Difficulty in walking, not elsewhere classified: Secondary | ICD-10-CM

## 2020-09-30 DIAGNOSIS — R2681 Unsteadiness on feet: Secondary | ICD-10-CM

## 2020-09-30 DIAGNOSIS — M6281 Muscle weakness (generalized): Secondary | ICD-10-CM

## 2020-09-30 NOTE — Therapy (Signed)
[facility] [place], [place], [address] Phone: [phone]   Fax:  [phone]  Physical Therapy Treatment  Patient Details  Name: Jon Reid MRN: [id number] Date of Birth: [birth year] Referring Provider (PT): Margette Fast   Encounter Date: 09/30/2020   PT End of Session - 09/30/20 0858     Visit Number 2    Number of Visits 12    Date for PT Re-Evaluation 11/09/20    PT Start Time 0903    PT Stop Time 0944    PT Time Calculation (min) 41 min    Activity Tolerance Patient tolerated treatment well    Behavior During Therapy [facility] for tasks assessed/performed             Past Medical History:  Diagnosis Date   Arthritis    Carcinoma of prostate ([place])    prostate   CKD (chronic kidney disease), stage III ([place])    Coronary artery disease    a. 10/2012 Cath: LM 20-30d, lAD 80p, 31m, 80-90d, LCX 90-82m, OM1 90, RCA 50p, 39m, 50d, RPDA 90, EF nl; b. 10/2012 CABG x 4 (LIMA-LAD, SVG-OM1, SVG-PDA->RPL); c. 07/2015 MV: Basal and proximal septal infarct w/o ischemia. EF 57%-->low risk.   First degree AV block 01/28/2019   Noted on EKG   GERD (gastroesophageal reflux disease)    Gout    HTN (hypertension)    Macrocytosis without anemia 01/08/2012   Moderate aortic stenosis    a. 10/2012 Echo: EF 55-60%, no rwma, BAE, mild AI, mild TR; b. 06/2020 Echo: EF 50-55%, no rwma, mild LVH. Mod dil LA. Mild MR. Mod AS.   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   Sleep apnea    Questionable   Spinal stenosis    Stroke [facility])     Past Surgical History:  Procedure Laterality Date   CARDIAC CATHETERIZATION  11/07/2012   Dr Acie Fredrickson   CATARACT EXTRACTION W/ INTRAOCULAR LENS IMPLANT Bilateral    CORONARY ARTERY BYPASS GRAFT N/A 11/11/2012   Procedure: CORONARY ARTERY BYPASS GRAFTING times four using Right Greater Saphenous Vein Graft harvested endoscopically  and Left Internal Mammary Artery.;  Surgeon: Ivin Poot, MD;  Location: [place];  Service: Open Heart Surgery;  Laterality: N/A;   ESOPHAGOGASTRODUODENOSCOPY (EGD) WITH PROPOFOL N/A 06/27/2020   Surgeon: [name], DO;  large hiatal hernia, LA grade D esophagitis with no bleeding, normal examined duodenum.    INGUINAL HERNIA REPAIR Right 01/10/2017   Procedure: OPEN RIGHT HERNIA REPAIR INGUINAL;  Surgeon: Ileana Roup, MD;  Location: [facility];  Service: General;  Laterality: Right;   INSERTION OF MESH Right 01/10/2017   Procedure: INSERTION OF MESH;  Surgeon: Ileana Roup, MD;  Location: [facility];  Service: General;  Laterality: Right;   INTRAOPERATIVE TRANSESOPHAGEAL ECHOCARDIOGRAM N/A 11/11/2012   Procedure: INTRAOPERATIVE TRANSESOPHAGEAL ECHOCARDIOGRAM;  Surgeon: Ivin Poot, MD;  Location: [place];  Service: Open Heart Surgery;  Laterality: N/A;   LEFT HEART CATHETERIZATION WITH CORONARY ANGIOGRAM N/A 11/07/2012   Procedure: LEFT HEART CATHETERIZATION WITH CORONARY ANGIOGRAM;  Surgeon: Thayer Headings, MD;  Location: [facility] C[facility];  Service: Cardiovascular;  Laterality: N/A;   LUMBAR LAMINECTOMY/DECOMPRESSION MICRODISCECTOMY N/A 02/05/2019   Procedure: LUMBAR LAMINECTOMY/DECOMPRESSION L3-L4;  Surgeon: Latanya Maudlin, MD;  Location: [facility];  Service: Orthopedics;  Laterality: N/A;  68min   LYMPH NODE DISSECTION  Bilateral pelvic   RETROPUBIC PROSTATECTOMY     Radical    There were no vitals filed for this visit.   Subjective Assessment - 09/30/20 0858     Subjective No new complaints.    Pertinent History CKD, CAD, HTN, history of stroke, CABG x4, lumbar laminectomy/decompression microdiscectomy 02/05/2019    Limitations Sitting;Standing;Walking    Currently in Pain? No/denies                [facility] PT Assessment - 09/30/20 0911       Assessment   Medical Diagnosis Parkinsoism    Next MD Visit N/A    Prior Therapy Yes      Precautions   Precautions  Fall      Ambulation/Gait   Ambulation/Gait Yes    Ambulation/Gait Assistance 7: Independent    Ambulation Distance (Feet) 646.09 Feet    Assistive device None    Gait Pattern Step-through pattern;Decreased arm swing - right;Decreased step length - right;Decreased stance time - right;Decreased dorsiflexion - right;Decreased dorsiflexion - left;Shuffle;Trunk flexed;Narrow base of support    Ambulation Surface Level;Indoor      6 Minute Walk- Baseline   6 Minute Walk- Baseline yes   646.09 ft in 6 min                          [facility] Adult PT Treatment/Exercise - 09/30/20 0911       Standardized Balance Assessment   Standardized Balance Assessment Berg Balance Test      Berg Balance Test   Sit to Stand Able to stand without using hands and stabilize independently    Standing Unsupported Able to stand safely 2 minutes    Sitting with Back Unsupported but Feet Supported on Floor or Stool Able to sit safely and securely 2 minutes    Stand to Sit Sits safely with minimal use of hands    Transfers Able to transfer safely, minor use of hands    Standing Unsupported with Eyes Closed Able to stand 10 seconds safely    Standing Ubsupported with Feet Together Able to place feet together independently and stand 1 minute safely    From Standing, Reach Forward with Outstretched Arm Can reach forward >12 cm safely (5")    From Standing Position, Pick up Object from Floor Able to pick up shoe safely and easily    From Standing Position, Turn to Look Behind Over each Shoulder Turn sideways only but maintains balance    Turn 360 Degrees Able to turn 360 degrees safely one side only in 4 seconds or less    Standing Unsupported, Alternately Place Feet on Step/Stool Able to complete 4 steps without aid or supervision    Standing Unsupported, One Foot in [employer] balance while stepping or standing    Standing on One Leg Able to lift leg independently and hold equal to or more than 3  seconds    Total Score 44      Exercises   Exercises Knee/Hip      Knee/Hip Exercises: Aerobic   Nustep L4, seat 11 x15 min      Knee/Hip Exercises: Seated   Long Arc Quad Strengthening;Both;2 sets;10 reps;Weights    Long Arc Quad Weight 4 lbs.    Clamshell with TheraBand Red   x20 reps                        PT Long Term Goals -  09/30/20 0802       PT LONG TERM GOAL #1   Title Patient will be independent with HEP.    Time 4      PT LONG TERM GOAL #2   Title Patient will perform 5x sit to stand test in 15 seconds or less with no UE support to improve balance and improve functional LE strength.    Time 4    Period Weeks      PT LONG TERM GOAL #3   Title Patient will decrease risk of falls as noted by a 4+ improvement on Berg Balance Scale.    Baseline TBD    Time 4    Period Weeks      PT LONG TERM GOAL #4   Title Patient will demonstrate  4+/5 or greater bilateral LE MMT to improve stability during functional tasks.                   Plan - 09/30/20 0924     Clinical Impression Statement Patient presented in clinic with capability of 646 ft within 6 minute walk test. Patient demonstrated more shuffle gait as patient turned corners. Patient able to achieve 44/56 on BERG score today as well with reports of RLE feeling heavy with marching. No complaints of increased weakness as treatment progressed but definite weakness of RLE noted.    Personal Factors and Comorbidities Comorbidity 3+;Age    Comorbidities PD, DM, fall risk, CHF    Examination-Activity Limitations Bed Mobility;Bend;Lift;Carry;Locomotion Level    Stability/Clinical Decision Making Stable/Uncomplicated    Rehab Potential Good    PT Frequency 2x / week    PT Duration 6 weeks    PT Treatment/Interventions ADLs/Self Care Home Management;Moist Heat;DME Instruction;Gait training;Therapeutic exercise;Therapeutic activities;Neuromuscular re-education;Patient/family education;Manual  techniques;Energy conservation    PT Next Visit Plan 6 min walk test, sit to stand following thoracic mobility activty - modified reclined over physioball    Consulted and Agree with Plan of Care Patient             Patient will benefit from skilled therapeutic intervention in order to improve the following deficits and impairments:  Abnormal gait, Decreased balance, Decreased endurance, Decreased mobility, Impaired vision/preception, Increased edema, Decreased range of motion, Decreased activity tolerance, Impaired flexibility, Postural dysfunction, Pain, Decreased strength  Visit Diagnosis: Difficulty in walking, not elsewhere classified  Repeated falls  Unsteadiness on feet  Muscle weakness (generalized)     Problem List Patient Active Problem List   Diagnosis Date Noted   Dizziness 09/23/2020   History of GI bleed 08/19/2020   Loss of weight 08/19/2020   Dysphagia 08/19/2020   Upper GI bleed 06/25/2020   COVID-19 virus infection 06/25/2020   [name] affect 02/09/2020   Iron deficiency anemia due to chronic blood loss 05/27/2019   Nonrheumatic mitral valve regurgitation 05/26/2019   Left anterior fascicular block 05/26/2019   Educated about COVID-19 virus infection 04/06/2019   Constipation 02/19/2019   Elevated troponin 02/09/2019   Fecal occult blood test positive 02/09/2019   Spinal stenosis, lumbar region with neurogenic claudication 02/05/2019   Leg swelling 01/26/2019   Nonrheumatic aortic valve stenosis 01/26/2019   Multiple joint pain 12/11/2018   Chronic pain of both knees 11/07/2018   Vitamin D deficiency 10/23/2018   Gastroesophageal reflux disease with esophagitis 03/13/2018   Murmur, cardiac 12/04/2017   Coronary artery disease involving native coronary artery of native heart without angina pectoris 12/04/2017   PVC's (premature ventricular contractions) 12/04/2017   Heart disease  08/21/2017   Weakness 11/14/2016   Abnormality of gait  12/06/2015   Abdominal aortic atherosclerosis ([place]) 12/01/2015   Degeneration of lumbar intervertebral disc 12/01/2015   Exertional angina ([place]) 07/20/2015   Hernia of abdominal wall 10/12/2014   Gynecomastia 04/09/2013   Hx of CABG 02/02/2013   Myelodysplastic syndrome 11/06/2012   Vitamin B12 deficiency 07/07/2012   Hyperlipemia 07/07/2012   Peripheral vascular disease ([place]) 04/14/2009   CARCINOMA, PROSTATE 04/13/2009   Non-thrombocytopenic purpura ([place]) 04/13/2009   Benign essential hypertension 04/13/2009   Chronic renal insufficiency, stage III (moderate) ([place]) 04/13/2009   SLEEP APNEA 04/13/2009    Standley Brooking, PTA 09/30/2020, 10:26 AM  [facility] [place], [place], [address] Phone: [phone]   Fax:  [phone]  Name: Jon Reid MRN: [id number] Date of Birth: [birth year]

## 2020-10-03 ENCOUNTER — Ambulatory Visit (HOSPITAL_COMMUNITY): Payer: MEDICARE | Admitting: Anesthesiology

## 2020-10-03 ENCOUNTER — Encounter (HOSPITAL_COMMUNITY)
Admission: RE | Disposition: A | Discharge status: Home / Self Care | Payer: Self-pay | Source: Home / Self Care | Attending: Internal Medicine

## 2020-10-03 ENCOUNTER — Other Ambulatory Visit: Payer: Self-pay

## 2020-10-03 ENCOUNTER — Ambulatory Visit (HOSPITAL_COMMUNITY)
Admission: RE | Admit: 2020-10-03 | Discharge: 2020-10-03 | Disposition: A | Discharge status: Home / Self Care | Payer: MEDICARE | Attending: Internal Medicine | Admitting: Internal Medicine

## 2020-10-03 DIAGNOSIS — R1310 Dysphagia, unspecified: Secondary | ICD-10-CM

## 2020-10-03 DIAGNOSIS — I25119 Atherosclerotic heart disease of native coronary artery with unspecified angina pectoris: Secondary | ICD-10-CM | POA: Diagnosis not present

## 2020-10-03 DIAGNOSIS — K209 Esophagitis, unspecified without bleeding: Secondary | ICD-10-CM | POA: Diagnosis not present

## 2020-10-03 DIAGNOSIS — K2100 Gastro-esophageal reflux disease with esophagitis, without bleeding: Secondary | ICD-10-CM | POA: Insufficient documentation

## 2020-10-03 DIAGNOSIS — K449 Diaphragmatic hernia without obstruction or gangrene: Secondary | ICD-10-CM | POA: Insufficient documentation

## 2020-10-03 DIAGNOSIS — K2970 Gastritis, unspecified, without bleeding: Secondary | ICD-10-CM

## 2020-10-03 DIAGNOSIS — B3781 Candidal esophagitis: Secondary | ICD-10-CM

## 2020-10-03 HISTORY — PX: BALLOON DILATION: SHX5330

## 2020-10-03 HISTORY — PX: ESOPHAGEAL BRUSHING: SHX6842

## 2020-10-03 HISTORY — PX: ESOPHAGOGASTRODUODENOSCOPY (EGD) WITH PROPOFOL: SHX5813

## 2020-10-03 LAB — KOH PREP

## 2020-10-03 SURGERY — ESOPHAGOGASTRODUODENOSCOPY (EGD) WITH PROPOFOL
Anesthesia: General

## 2020-10-03 MED ORDER — PROPOFOL 10 MG/ML IV BOLUS
INTRAVENOUS | Status: DC | PRN
  Administered 2020-10-03: 100 ug/kg/min via INTRAVENOUS
  Administered 2020-10-03: 80 mg via INTRAVENOUS

## 2020-10-03 MED ORDER — STERILE WATER FOR IRRIGATION IR SOLN
Status: DC | PRN
  Administered 2020-10-03: 100 mL

## 2020-10-03 MED ORDER — LACTATED RINGERS IV SOLN: INTRAVENOUS | Status: DC

## 2020-10-03 MED ORDER — LIDOCAINE HCL (CARDIAC) PF 100 MG/5ML IV SOSY
PREFILLED_SYRINGE | INTRAVENOUS | Status: DC | PRN
  Administered 2020-10-03: 60 mg via INTRAVENOUS

## 2020-10-03 NOTE — Op Note (Signed)
[facility] Patient Name: Jon Reid Procedure Date: 10/03/2020 1:17 PM MRN: [id number] Date of Birth: 05/10/1928 Attending MD: Elon Alas. Abbey Chatters DO CSN: [id number] Age: 87 Admit Type: Outpatient Procedure:                Upper GI endoscopy Indications:              Dysphagia, Follow-up of esophagitis Providers:                Elon Alas. Abbey Chatters, DO, Tammy Vaught, RN, Randa Spike, Technician Referring MD:              Medicines:                See the Anesthesia note for documentation of the                            administered medications Complications:            No immediate complications. Estimated Blood Loss:     Estimated blood loss: none. Procedure:                Pre-Anesthesia Assessment:                           - The anesthesia plan was to use monitored                            anesthesia care (MAC).                           After obtaining informed consent, the endoscope was                            passed under direct vision. Throughout the                            procedure, the patient's blood pressure, pulse, and                            oxygen saturations were monitored continuously. The                            GIF-H190 ([id number]) scope was introduced through the                            mouth, and advanced to the second part of duodenum.                            The upper GI endoscopy was accomplished without                            difficulty. The patient tolerated the procedure                            well. Scope In: 1:26:34 PM  Scope Out: 1:31:35 PM Total Procedure Duration: 0 hours 5 minutes 1 second  Findings:      Non-severe candidal esophagitis with no bleeding was found in the middle       third of the esophagus. Cells for cytology were obtained by brushing.       Previously noted erosive esophagitis has healed.      Localized mild inflammation characterized by erythema was found in the        gastric antrum. J-shaped stomach      A large hiatal hernia was present.      The duodenal bulb, first portion of the duodenum and second portion of       the duodenum were normal. Impression:               - Non-severe candidiasis esophagitis with no                            bleeding. Cells for cytology obtained.                           - Gastritis.                           - Large hiatal hernia.                           - Normal duodenal bulb, first portion of the                            duodenum and second portion of the duodenum. Moderate Sedation:      Per Anesthesia Care Recommendation:           - Patient has a contact number available for                            emergencies. The signs and symptoms of potential                            delayed complications were discussed with the                            patient. Return to normal activities tomorrow.                            Written discharge instructions were provided to the                            patient.                           - Resume previous diet.                           - Continue present medications.                           - Await pathology results.                           -  Use Protonix (pantoprazole) 40 mg PO daily.                           - Treat for candidal esophagitis if cytology                            positive.                           - Return to GI clinic in 3 months. Procedure Code(s):        --- Professional ---                           [phone], Esophagogastroduodenoscopy, flexible,                            transoral; diagnostic, including collection of                            specimen(s) by brushing or washing, when performed                            (separate procedure) Diagnosis Code(s):        --- Professional ---                           B37.81, Candidal esophagitis                           K29.70, Gastritis, unspecified, without bleeding                            K44.9, Diaphragmatic hernia without obstruction or                            gangrene                           R13.10, Dysphagia, unspecified                           K20.90, Esophagitis, unspecified without bleeding CPT copyright 2019 American Medical Association. All rights reserved. The codes documented in this report are preliminary and upon coder review may  be revised to meet current compliance requirements. Elon Alas. Abbey Chatters, DO [place] Abbey Chatters, DO 10/03/2020 1:52:06 PM This report has been signed electronically. Number of Addenda: 0

## 2020-10-03 NOTE — Discharge Instructions (Signed)
EGD Discharge instructions Please read the instructions outlined below and refer to this sheet in the next few weeks. These discharge instructions provide you with general information on caring for yourself after you leave the hospital. Your doctor may also give you specific instructions. While your treatment has been planned according to the most current medical practices available, unavoidable complications occasionally occur. If you have any problems or questions after discharge, please call your doctor. ACTIVITY You may resume your regular activity but move at a slower pace for the next 24 hours.  Take frequent rest periods for the next 24 hours.  Walking will help expel (get rid of) the air and reduce the bloated feeling in your abdomen.  No driving for 24 hours (because of the anesthesia (medicine) used during the test).  You may shower.  Do not sign any important legal documents or operate any machinery for 24 hours (because of the anesthesia used during the test).  NUTRITION Drink plenty of fluids.  You may resume your normal diet.  Begin with a light meal and progress to your normal diet.  Avoid alcoholic beverages for 24 hours or as instructed by your caregiver.  MEDICATIONS You may resume your normal medications unless your caregiver tells you otherwise.  WHAT YOU CAN EXPECT TODAY You may experience abdominal discomfort such as a feeling of fullness or "gas" pains.  FOLLOW-UP Your doctor will discuss the results of your test with you.  SEEK IMMEDIATE MEDICAL ATTENTION IF ANY OF THE FOLLOWING OCCUR: Excessive nausea (feeling sick to your stomach) and/or vomiting.  Severe abdominal pain and distention (swelling).  Trouble swallowing.  Temperature over 101 F (37.8 C).  Rectal bleeding or vomiting of blood.   Your previously noted severe inflammation in your esophagus has healed nicely. Okay to decrease pantoprazole to once daily. Does appear now that you may have a yeast  infection in your esophagus, however. I took samples today and will follow up with you on these. We may need to treat you with a 14 day course of antifungals. My office will contact you when we get these results back. Follow up with GI in 3 months   I hope you have a great rest of your week!  Elon Alas. Abbey Chatters, D.O. Gastroenterology and Hepatology [facility]

## 2020-10-03 NOTE — Anesthesia Preprocedure Evaluation (Addendum)
Anesthesia Evaluation  Patient identified by MRN, date of birth, ID band Patient awake    Reviewed: Allergy & Precautions, NPO status , Patient's Chart, lab work & pertinent test results  History of Anesthesia Complications Negative for: history of anesthetic complications  Airway Mallampati: III  TM Distance: >3 FB Neck ROM: Full    Dental  (+) Dental Advisory Given, Partial Lower, Partial Upper, Chipped, Missing   Pulmonary sleep apnea ,    Pulmonary exam normal breath sounds clear to auscultation       Cardiovascular Exercise Tolerance: Good hypertension, Pt. on medications + angina with exertion + CAD, + Past MI (elevated troponins ), + CABG and + Peripheral Vascular Disease  + dysrhythmias + Valvular Problems/Murmurs AS and MR  Rhythm:Regular Rate:Normal + Systolic murmurs High troponin, reviewed Cardiology note and echo  1. Distal septal hyokinesis. Left ventricular ejection fraction, by estimation, is 50 to 55%. The left ventricle has low normal function. The left ventricle demonstrates regional wall motion abnormalities (see scoring diagram/findings for description).  There is mild left ventricular hypertrophy. Left ventricular diastolic parameters are indeterminate.  2. Right ventricular systolic function is normal. The right ventricular  size is normal. There is mildly elevated pulmonary artery systolic  pressure.  3. Left atrial size was moderately dilated.  4. The mitral valve is abnormal. Mild mitral valve regurgitation. No evidence of mitral stenosis. Moderate mitral annular calcification.  5. The aortic valve is tricuspid. There is moderate calcification of the aortic valve. There is moderate thickening of the aortic valve. Aortic valve regurgitation is mild. Moderate aortic valve stenosis.  6. The inferior vena cava is normal in size with greater than 50% respiratory variability, suggesting right atrial pressure  of 3 mmHg.    Neuro/Psych  Neuromuscular disease (Parkinson's disease?) CVA, No Residual Symptoms negative psych ROS   GI/Hepatic Neg liver ROS, GERD  Medicated and Controlled,  Endo/Other  negative endocrine ROS  Renal/GU Renal InsufficiencyRenal disease   Prostate cancer    Musculoskeletal  (+) Arthritis  (back sx),   Abdominal   Peds  Hematology  (+) anemia ,   Anesthesia Other Findings Right hand weakness after back sx Parkinson's disease?  Reproductive/Obstetrics negative OB ROS                           Anesthesia Physical  Anesthesia Plan  ASA: 4  Anesthesia Plan: General   Post-op Pain Management:    Induction: Intravenous  PONV Risk Score and Plan: Propofol infusion  Airway Management Planned: Nasal Cannula and Natural Airway  Additional Equipment:   Intra-op Plan:   Post-operative Plan: Possible Post-op intubation/ventilation  Informed Consent: I have reviewed the patients History and Physical, chart, labs and discussed the procedure including the risks, benefits and alternatives for the proposed anesthesia with the patient or authorized representative who has indicated his/her understanding and acceptance.     Dental advisory given  Plan Discussed with: CRNA and Surgeon  Anesthesia Plan Comments:         Anesthesia Quick Evaluation

## 2020-10-03 NOTE — Anesthesia Postprocedure Evaluation (Signed)
Anesthesia Post Note  Patient: KNOAH NEDEAU  Procedure(s) Performed: ESOPHAGOGASTRODUODENOSCOPY (EGD) WITH PROPOFOL BALLOON DILATION ESOPHAGEAL BRUSHING  Patient location during evaluation: Phase II Anesthesia Type: General Level of consciousness: awake and alert and oriented Pain management: pain level controlled Vital Signs Assessment: post-procedure vital signs reviewed and stable Respiratory status: spontaneous breathing and respiratory function stable Cardiovascular status: blood pressure returned to baseline and stable Postop Assessment: no apparent nausea or vomiting Anesthetic complications: no   No notable events documented.   Last Vitals:  Vitals:   10/03/20 1258 10/03/20 1337  BP: 132/73 (!) 104/54  Pulse: 72 63  Resp: (!) 22 20  Temp: 37 C 37.1 C  SpO2: 97% 98%    Last Pain:  Vitals:   10/03/20 1337  TempSrc: Axillary  PainSc: 0-No pain                 Amory Simonetti C Karis Emig

## 2020-10-03 NOTE — H&P (Signed)
Primary Care Physician:  Claretta Fraise, MD Primary Gastroenterologist:  Dr. Abbey Chatters  Pre-Procedure History & Physical: HPI:  Jon Reid is a 87 y.o. male is here for an EGD due to previous upper GI bleed and acute blood loss anemia in setting of LA grade D esophagitis. Doing well since hospitalization. On PPI therapy, Hgb improved. Denies GI bleeding  Past Medical History:  Diagnosis Date   Arthritis    Carcinoma of prostate ([place])    prostate   CKD (chronic kidney disease), stage III (HCC)    Coronary artery disease    a. 10/2012 Cath: LM 20-30d, lAD 80p, 33m, 80-90d, LCX 90-68m, OM1 90, RCA 50p, 13m, 50d, RPDA 90, EF nl; b. 10/2012 CABG x 4 (LIMA-LAD, SVG-OM1, SVG-PDA->RPL); c. 07/2015 MV: Basal and proximal septal infarct w/o ischemia. EF 57%-->low risk.   First degree AV block 01/28/2019   Noted on EKG   GERD (gastroesophageal reflux disease)    Gout    HTN (hypertension)    Macrocytosis without anemia 01/08/2012   Moderate aortic stenosis    a. 10/2012 Echo: EF 55-60%, no rwma, BAE, mild AI, mild TR; b. 06/2020 Echo: EF 50-55%, no rwma, mild LVH. Mod dil LA. Mild MR. Mod AS.   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   Sleep apnea    Questionable   Spinal stenosis    Stroke [facility])     Past Surgical History:  Procedure Laterality Date   CARDIAC CATHETERIZATION  11/07/2012   Dr Acie Fredrickson   CATARACT EXTRACTION W/ INTRAOCULAR LENS IMPLANT Bilateral    CORONARY ARTERY BYPASS GRAFT N/A 11/11/2012   Procedure: CORONARY ARTERY BYPASS GRAFTING times four using Right Greater Saphenous Vein Graft harvested endoscopically and Left Internal Mammary Artery.;  Surgeon: Ivin Poot, MD;  Location: [place];  Service: Open Heart Surgery;  Laterality: N/A;   ESOPHAGOGASTRODUODENOSCOPY (EGD) WITH PROPOFOL N/A 06/27/2020   Surgeon: [name], DO;  large hiatal hernia, LA grade D esophagitis with no bleeding, normal  examined duodenum.    INGUINAL HERNIA REPAIR Right 01/10/2017   Procedure: OPEN RIGHT HERNIA REPAIR INGUINAL;  Surgeon: Ileana Roup, MD;  Location: [facility];  Service: General;  Laterality: Right;   INSERTION OF MESH Right 01/10/2017   Procedure: INSERTION OF MESH;  Surgeon: Ileana Roup, MD;  Location: [facility];  Service: General;  Laterality: Right;   INTRAOPERATIVE TRANSESOPHAGEAL ECHOCARDIOGRAM N/A 11/11/2012   Procedure: INTRAOPERATIVE TRANSESOPHAGEAL ECHOCARDIOGRAM;  Surgeon: Ivin Poot, MD;  Location: [facility];  Service: Open Heart Surgery;  Laterality: N/A;   LEFT HEART CATHETERIZATION WITH CORONARY ANGIOGRAM N/A 11/07/2012   Procedure: LEFT HEART CATHETERIZATION WITH CORONARY ANGIOGRAM;  Surgeon: Thayer Headings, MD;  Location[facility] C[facility];  Service: Cardiovascular;  Laterality: N/A;   LUMBAR LAMINECTOMY/DECOMPRESSION MICRODISCECTOMY N/A 02/05/2019   Procedure: LUMBAR LAMINECTOMY/DECOMPRESSION L3-L4;  Surgeon: Latanya Maudlin, MD;  Location: [facility];  Service: Orthopedics;  Laterality: N/A;  18min   LYMPH NODE DISSECTION     Bilateral pelvic   RETROPUBIC PROSTATECTOMY     Radical    Prior to Admission medications   Medication Sig Start Date End Date Taking? Authorizing Provider  ascorbic acid (VITAMIN C) 500 MG tablet Take 1 tablet (500 mg total) by mouth daily. 06/30/20  Yes Barton Dubois, MD  aspirin EC 81 MG tablet Take 81 mg by mouth daily.   Yes [provider]  carbidopa-levodopa (SINEMET IR) 25-100 MG tablet Take 1.5 tablets by mouth 3 (three) times daily. 09/21/20  Yes Kathrynn Ducking, MD  Cholecalciferol (VITAMIN D) 2000 units CAPS Take 2,000 Units by mouth daily.   Yes [provider]  fluticasone (FLONASE) 50 MCG/ACT nasal spray One to 2 sprays each nostril at bedtime Patient taking differently: Place 2 sprays into both nostrils at bedtime. 10/12/14  Yes Chipper Herb, MD  furosemide (LASIX) 40 MG tablet Take 40 mg by mouth daily. 09/15/20   Yes [provider]  Iron, Ferrous Sulfate, 325 (65 Fe) MG TABS Take 1 tablet by mouth daily. Patient taking differently: Take 325 mg by mouth daily. 06/29/20  Yes Barton Dubois, MD  nitroGLYCERIN (NITROSTAT) 0.4 MG SL tablet Place 1 tablet (0.4 mg total) under the tongue every 5 (five) minutes as needed for chest pain. 07/20/15  Yes Kilroy, Luke K, PA-C  pantoprazole (PROTONIX) 40 MG tablet Take 1 tablet (40 mg total) by mouth 2 (two) times daily. TAKE 1 TABLET ONCE DAILY FOR REFLUX Patient taking differently: Take 40 mg by mouth 2 (two) times daily. 06/29/20  Yes Barton Dubois, MD  triamcinolone cream (KENALOG) 0.1 % Apply 1 application topically daily as needed (itching). 06/29/20  Yes Barton Dubois, MD  zinc sulfate 220 (50 Zn) MG capsule TAKE ONE CAPSULE ONCE DAILY Patient taking differently: Take 220 mg by mouth daily. 08/23/20  Yes Claretta Fraise, MD    Allergies as of 09/09/2020 - Review Complete 08/28/2020  Allergen Reaction Noted   Crestor [rosuvastatin] Other (See Comments) 09/03/2013    Family History  Problem Relation Age of Onset   Aneurysm Father        Cerebral   Stroke Father    Hypertension Father    Cancer Mother        colon   Parkinson's disease Son     Social History   Socioeconomic History   Marital status: Married    Spouse name: [name]   Number of children: 3   Years of education: 12   Highest education level: High school graduate  Occupational History   Occupation: Retired  Tobacco Use   Smoking status: Never   Smokeless tobacco: Never  Scientific laboratory technician Use: Never used  Substance and Sexual Activity   Alcohol use: Yes    Comment: Rare   Drug use: No   Sexual activity: Not Currently  Other Topics Concern   Not on file  Social History Narrative   Lives at home with wife/sons will stay with them occassionally   Married with 3 children   Denies caffeine use    Social Determinants of Radio broadcast assistant Strain: Not on file   Food Insecurity: Not on file  Transportation Needs: Not on file  Physical Activity: Not on file  Stress: Not on file  Social Connections: Not on file  Intimate Partner Violence: Not on file    Review of Systems: See HPI, otherwise negative ROS  Physical Exam: Vital signs in last 24 hours: Temp:  [98.6 F (37 C)] 98.6 F (37 C) (07/18 1258) Pulse Rate:  [72] 72 (07/18 1258) Resp:  [22] 22 (07/18 1258) BP: (132)/(73) 132/73 (07/18 1258) SpO2:  [97 %] 97 % (07/18 1258)   General:   Alert,  Well-developed, well-nourished, pleasant and cooperative in NAD Head:  Normocephalic and atraumatic. Eyes:  Sclera clear, no icterus.   Conjunctiva pink. Ears:  Normal auditory acuity. Nose:  No  deformity, discharge,  or lesions. Mouth:  No deformity or lesions, dentition normal. Neck:  Supple; no masses or thyromegaly. Lungs:  Clear throughout to auscultation.   No wheezes, crackles, or rhonchi. No acute distress. Heart:  Regular rate and rhythm; no murmurs, clicks, rubs,  or gallops. Abdomen:  Soft, nontender and nondistended. No masses, hepatosplenomegaly or hernias noted. Normal bowel sounds, without guarding, and without rebound.   Msk:  Symmetrical without gross deformities. Normal posture. Extremities:  Without clubbing or edema. Neurologic:  Alert and  oriented x4;  grossly normal neurologically. Skin:  Intact without significant lesions or rashes. Cervical Nodes:  No significant cervical adenopathy. Psych:  Alert and cooperative. Normal mood and affect.  Impression/Plan: [name] is here for an EGD due to previous upper GI bleed and acute blood loss anemia in setting of LA grade D esophagitis.   The risks of the procedure including infection, bleed, or perforation as well as benefits, limitations, alternatives and imponderables have been reviewed with the patient. Questions have been answered. All parties agreeable.

## 2020-10-03 NOTE — Transfer of Care (Signed)
Immediate Anesthesia Transfer of Care Note  Patient: Jon Reid  Procedure(s) Performed: ESOPHAGOGASTRODUODENOSCOPY (EGD) WITH PROPOFOL BALLOON DILATION ESOPHAGEAL BRUSHING  Patient Location: Short Stay  Anesthesia Type:General  Level of Consciousness: awake, drowsy and patient cooperative  Airway & Oxygen Therapy: Patient Spontanous Breathing  Post-op Assessment: Report given to RN, Post -op Vital signs reviewed and stable and Patient moving all extremities X 4  Post vital signs: Reviewed and stable  Last Vitals:  Vitals Value Taken Time  BP    Temp    Pulse    Resp    SpO2      Last Pain:  Vitals:   10/03/20 1322  TempSrc:   PainSc: 0-No pain         Complications: No notable events documented.

## 2020-10-04 ENCOUNTER — Other Ambulatory Visit: Payer: Self-pay

## 2020-10-04 ENCOUNTER — Ambulatory Visit: Payer: MEDICARE

## 2020-10-04 DIAGNOSIS — R296 Repeated falls: Secondary | ICD-10-CM

## 2020-10-04 DIAGNOSIS — R2681 Unsteadiness on feet: Secondary | ICD-10-CM

## 2020-10-04 DIAGNOSIS — G8929 Other chronic pain: Secondary | ICD-10-CM

## 2020-10-04 DIAGNOSIS — M6281 Muscle weakness (generalized): Secondary | ICD-10-CM

## 2020-10-04 DIAGNOSIS — M25561 Pain in right knee: Secondary | ICD-10-CM

## 2020-10-04 DIAGNOSIS — R262 Difficulty in walking, not elsewhere classified: Secondary | ICD-10-CM | POA: Diagnosis not present

## 2020-10-04 DIAGNOSIS — R600 Localized edema: Secondary | ICD-10-CM

## 2020-10-04 NOTE — Therapy (Signed)
[facility] [place], [place], [address] Phone: [phone]   Fax:  [phone]  Physical Therapy Treatment  Patient Details  Name: Jon Reid MRN: [id number] Date of Birth: 1928/11/05 Referring Provider (PT): Margette Fast   Encounter Date: 10/04/2020   PT End of Session - 10/04/20 1426     Visit Number 3    Number of Visits 12    Date for PT Re-Evaluation 11/09/20    PT Start Time 1030    PT Stop Time 1120    PT Time Calculation (min) 50 min    Equipment Utilized During Treatment Gait belt    Activity Tolerance Patient tolerated treatment well    Behavior During Therapy WFL for tasks assessed/performed             Past Medical History:  Diagnosis Date   Arthritis    Carcinoma of prostate ([place])    prostate   CKD (chronic kidney disease), stage III ([place])    Coronary artery disease    a. 10/2012 Cath: LM 20-30d, lAD 80p, 54m, 80-90d, LCX 90-52m, OM1 90, RCA 50p, 89m, 50d, RPDA 90, EF nl; b. 10/2012 CABG x 4 (LIMA-LAD, SVG-OM1, SVG-PDA->RPL); c. 07/2015 MV: Basal and proximal septal infarct w/o ischemia. EF 57%-->low risk.   First degree AV block 01/28/2019   Noted on EKG   GERD (gastroesophageal reflux disease)    Gout    HTN (hypertension)    Macrocytosis without anemia 01/08/2012   Moderate aortic stenosis    a. 10/2012 Echo: EF 55-60%, no rwma, BAE, mild AI, mild TR; b. 06/2020 Echo: EF 50-55%, no rwma, mild LVH. Mod dil LA. Mild MR. Mod AS.   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   Sleep apnea    Questionable   Spinal stenosis    Stroke [facility])     Past Surgical History:  Procedure Laterality Date   CARDIAC CATHETERIZATION  11/07/2012   Dr Acie Fredrickson   CATARACT EXTRACTION W/ INTRAOCULAR LENS IMPLANT Bilateral    CORONARY ARTERY BYPASS GRAFT N/A 11/11/2012   Procedure: CORONARY ARTERY BYPASS GRAFTING times four using Right  Greater Saphenous Vein Graft harvested endoscopically and Left Internal Mammary Artery.;  Surgeon: Ivin Poot, MD;  Location: [facility];  Service: Open Heart Surgery;  Laterality: N/A;   ESOPHAGOGASTRODUODENOSCOPY (EGD) WITH PROPOFOL N/A 06/27/2020   Surgeon: [name], DO;  large hiatal hernia, LA grade D esophagitis with no bleeding, normal examined duodenum.    INGUINAL HERNIA REPAIR Right 01/10/2017   Procedure: OPEN RIGHT HERNIA REPAIR INGUINAL;  Surgeon: Ileana Roup, MD;  Location: [facility];  Service: General;  Laterality: Right;   INSERTION OF MESH Right 01/10/2017   Procedure: INSERTION OF MESH;  Surgeon: Ileana Roup, MD;  [facility]: [facility];  Service: General;  Laterality: Right;   INTRAOPERATIVE TRANSESOPHAGEAL ECHOCARDIOGRAM N/A 11/11/2012   Procedure: INTRAOPERATIVE TRANSESOPHAGEAL ECHOCARDIOGRAM;  Surgeon: Ivin Poot, MD;  Location: [facility];  Service: Open Heart Surgery;  Laterality: N/A;   LEFT HEART CATHETERIZATION WITH CORONARY ANGIOGRAM N/A 11/07/2012   Procedure: LEFT HEART CATHETERIZATION WITH CORONARY ANGIOGRAM;  Surgeon: Thayer Headings, MD;  Location: [facility] CATH LAB;  Service: Cardiovascular;  Laterality: N/A;   LUMBAR LAMINECTOMY/DECOMPRESSION MICRODISCECTOMY N/A 02/05/2019   Procedure: LUMBAR LAMINECTOMY/DECOMPRESSION L3-L4;  Surgeon: Latanya Maudlin, MD;  Location: [facility];  Service: Orthopedics;  Laterality: N/A;  28min  LYMPH NODE DISSECTION     Bilateral pelvic   RETROPUBIC PROSTATECTOMY     Radical    There were no vitals filed for this visit.   Subjective Assessment - 10/04/20 1040     Subjective Covid-19 screen performed up entering clinic. Patient repors feeling better than he thought he would with having good energy today even after colonoscpoy yesterday.    Pertinent History CKD, CAD, HTN, history of stroke, CABG x4, lumbar laminectomy/decompression microdiscectomy 02/05/2019    Currently in Pain? No/denies              [facility] Adult  PT Treatment/Exercise - 10/04/20 0001       Ambulation/Gait   Ambulation/Gait Yes    Ambulation/Gait Assistance 6: Modified independent (Device/Increase time)      Posture/Postural Control   Posture/Postural Control Postural limitations    Postural Limitations Rounded Shoulders;Forward head;Flexed trunk;Increased thoracic kyphosis      Neuro Re-ed    Neuro Re-ed Details  walking halway and dribbling ball for balance and perturbations; walking with head turns      Exercises   Exercises Knee/Hip      Knee/Hip Exercises: Aerobic   Nustep L4, seat 11 x15 min      Knee/Hip Exercises: Standing   Other Standing Knee Exercises resisted walking with blue tband retro and fwd    Other Standing Knee Exercises physioball roll up the wall      Knee/Hip Exercises: Seated   Long Arc Quad Strengthening;Both;2 sets;10 reps;Weights    Other Seated Knee/Hip Exercises physioball pressdown x 8 counting to 5 outloud    Sit to [employer] 10 reps;Other (comment);without UE support   tossng physioball x 10     Manual Therapy   Manual Therapy Soft tissue mobilization    Soft tissue mobilization reclined over physioball thoracic extesnionwith PT support - gentle rocking for mobility              PT Long Term Goals - 09/30/20 0802       PT LONG TERM GOAL #1   Title Patient will be independent with HEP.    Time 4      PT LONG TERM GOAL #2   Title Patient will perform 5x sit to stand test in 15 seconds or less with no UE support to improve balance and improve functional LE strength.    Time 4    Period Weeks      PT LONG TERM GOAL #3   Title Patient will decrease risk of falls as noted by a 4+ improvement on Berg Balance Scale.    Baseline TBD    Time 4    Period Weeks      PT LONG TERM GOAL #4   Title Patient will demonstrate  4+/5 or greater bilateral LE MMT to improve stability during functional tasks.                   Plan - 10/04/20 1427     Clinical Impression Statement No  LOB noted this session. Patient performed all balance and transfers well with intermittent difficulty requiring multiple attempts at sit to stands once fatigued. BP 120/64. - patient reports feeling tired today due to recent anesthesia. Therapy session focussed on mobility and transfers within tolerance.    Personal Factors and Comorbidities Comorbidity 3+;Age    Comorbidities PD, DM, fall risk, CHF    Examination-Activity Limitations Bed Mobility;Bend;Lift;Carry;Locomotion Level    Stability/Clinical Decision Making Stable/Uncomplicated    Clinical Decision  Making Low    Rehab Potential Good    PT Frequency 2x / week    PT Duration 6 weeks    PT Treatment/Interventions ADLs/Self Care Home Management;Moist Heat;DME Instruction;Gait training;Therapeutic exercise;Therapeutic activities;Neuromuscular re-education;Patient/family education;Manual techniques;Energy conservation    PT Next Visit Plan sit to stand following thoracic mobility activty - modified reclined over physioball             Patient will benefit from skilled therapeutic intervention in order to improve the following deficits and impairments:  Abnormal gait, Decreased balance, Decreased endurance, Decreased mobility, Impaired vision/preception, Increased edema, Decreased range of motion, Decreased activity tolerance, Impaired flexibility, Postural dysfunction, Pain, Decreased strength  Visit Diagnosis: Difficulty in walking, not elsewhere classified  Repeated falls  Unsteadiness on feet  Muscle weakness (generalized)  Chronic pain of right knee  Localized edema     Problem List Patient Active Problem List   Diagnosis Date Noted   Dizziness 09/23/2020   History of GI bleed 08/19/2020   Loss of weight 08/19/2020   Dysphagia 08/19/2020   Upper GI bleed 06/25/2020   COVID-19 virus infection 06/25/2020   [name] affect 02/09/2020   Iron deficiency anemia due to chronic blood loss 05/27/2019   Nonrheumatic  mitral valve regurgitation 05/26/2019   Left anterior fascicular block 05/26/2019   Educated about COVID-19 virus infection 04/06/2019   Constipation 02/19/2019   Elevated troponin 02/09/2019   Fecal occult blood test positive 02/09/2019   Spinal stenosis, lumbar region with neurogenic claudication 02/05/2019   Leg swelling 01/26/2019   Nonrheumatic aortic valve stenosis 01/26/2019   Multiple joint pain 12/11/2018   Chronic pain of both knees 11/07/2018   Vitamin D deficiency 10/23/2018   Gastroesophageal reflux disease with esophagitis 03/13/2018   Murmur, cardiac 12/04/2017   Coronary artery disease involving native coronary artery of native heart without angina pectoris 12/04/2017   PVC's (premature ventricular contractions) 12/04/2017   Heart disease 08/21/2017   Weakness 11/14/2016   Abnormality of gait 12/06/2015   Abdominal aortic atherosclerosis ([place]) 12/01/2015   Degeneration of lumbar intervertebral disc 12/01/2015   Exertional angina ([place]) 07/20/2015   Hernia of abdominal wall 10/12/2014   Gynecomastia 04/09/2013   Hx of CABG 02/02/2013   Myelodysplastic syndrome 11/06/2012   Vitamin B12 deficiency 07/07/2012   Hyperlipemia 07/07/2012   Peripheral vascular disease ([place]) 04/14/2009   CARCINOMA, PROSTATE 04/13/2009   Non-thrombocytopenic purpura ([place]) 04/13/2009   Benign essential hypertension 04/13/2009   Chronic renal insufficiency, stage III (moderate) ([place]) 04/13/2009   SLEEP APNEA 04/13/2009    Marylou Mccoy PT. DPT 10/04/2020, 2:31 PM  [facility] [address] [name], [place], [address] Phone: [phone]   Fax:  [phone]  Name: DENZELL COLASANTI MRN: [id number] Date of Birth: 1928/12/13

## 2020-10-06 ENCOUNTER — Ambulatory Visit: Payer: MEDICARE | Admitting: *Deleted

## 2020-10-06 ENCOUNTER — Other Ambulatory Visit: Payer: Self-pay

## 2020-10-06 DIAGNOSIS — R262 Difficulty in walking, not elsewhere classified: Secondary | ICD-10-CM

## 2020-10-06 DIAGNOSIS — M6281 Muscle weakness (generalized): Secondary | ICD-10-CM

## 2020-10-06 DIAGNOSIS — R2681 Unsteadiness on feet: Secondary | ICD-10-CM

## 2020-10-06 DIAGNOSIS — R296 Repeated falls: Secondary | ICD-10-CM

## 2020-10-06 NOTE — Therapy (Signed)
[facility] [place], [place], [address] Phone: [phone]   Fax:  [phone]  Physical Therapy Treatment  Patient Details  Name: Jon Reid MRN: [id number] Date of Birth: 10/20/1928 Referring Provider (PT): Margette Fast   Encounter Date: 10/06/2020   PT End of Session - 10/06/20 1316     Visit Number 4    Number of Visits 12    Date for PT Re-Evaluation 11/09/20    PT Start Time 1030    PT Stop Time 1118    PT Time Calculation (min) 48 min             Past Medical History:  Diagnosis Date   Arthritis    Carcinoma of prostate ([place])    prostate   CKD (chronic kidney disease), stage III ([place])    Coronary artery disease    a. 10/2012 Cath: LM 20-30d, lAD 80p, 20m, 80-90d, LCX 90-59m, OM1 90, RCA 50p, 102m, 50d, RPDA 90, EF nl; b. 10/2012 CABG x 4 (LIMA-LAD, SVG-OM1, SVG-PDA->RPL); c. 07/2015 MV: Basal and proximal septal infarct w/o ischemia. EF 57%-->low risk.   First degree AV block 01/28/2019   Noted on EKG   GERD (gastroesophageal reflux disease)    Gout    HTN (hypertension)    Macrocytosis without anemia 01/08/2012   Moderate aortic stenosis    a. 10/2012 Echo: EF 55-60%, no rwma, BAE, mild AI, mild TR; b. 06/2020 Echo: EF 50-55%, no rwma, mild LVH. Mod dil LA. Mild MR. Mod AS.   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   Sleep apnea    Questionable   Spinal stenosis    Stroke [facility])     Past Surgical History:  Procedure Laterality Date   CARDIAC CATHETERIZATION  11/07/2012   Dr Acie Fredrickson   CATARACT EXTRACTION W/ INTRAOCULAR LENS IMPLANT Bilateral    CORONARY ARTERY BYPASS GRAFT N/A 11/11/2012   Procedure: CORONARY ARTERY BYPASS GRAFTING times four using Right Greater Saphenous Vein Graft harvested endoscopically and Left Internal Mammary Artery.;  Surgeon: Ivin Poot, MD;  Location: [place];  Service: Open Heart Surgery;   Laterality: N/A;   ESOPHAGOGASTRODUODENOSCOPY (EGD) WITH PROPOFOL N/A 06/27/2020   Surgeon: [name], DO;  large hiatal hernia, LA grade D esophagitis with no bleeding, normal examined duodenum.    INGUINAL HERNIA REPAIR Right 01/10/2017   Procedure: OPEN RIGHT HERNIA REPAIR INGUINAL;  Surgeon: Ileana Roup, MD;  [facility];  Service: General;  Laterality: Right;   INSERTION OF MESH Right 01/10/2017   Procedure: INSERTION OF MESH;  Surgeon: Ileana Roup, MD;  [facility];  Service: General;  Laterality: Right;   INTRAOPERATIVE TRANSESOPHAGEAL ECHOCARDIOGRAM N/A 11/11/2012   Procedure: INTRAOPERATIVE TRANSESOPHAGEAL ECHOCARDIOGRAM;  Surgeon: Ivin Poot, MD;  Location: [place];  Service: Open Heart Surgery;  Laterality: N/A;   LEFT HEART CATHETERIZATION WITH CORONARY ANGIOGRAM N/A 11/07/2012   Procedure: LEFT HEART CATHETERIZATION WITH CORONARY ANGIOGRAM;  Surgeon: Thayer Headings, MD;  Location: [facility] CATH LAB;  Service: Cardiovascular;  Laterality: N/A;   LUMBAR LAMINECTOMY/DECOMPRESSION MICRODISCECTOMY N/A 02/05/2019   Procedure: LUMBAR LAMINECTOMY/DECOMPRESSION L3-L4;  Surgeon: Latanya Maudlin, MD;  [facility];  Service: Orthopedics;  Laterality: N/A;  52min   LYMPH NODE DISSECTION     Bilateral pelvic   RETROPUBIC PROSTATECTOMY     Radical    There were no vitals filed  for this visit.   Subjective Assessment - 10/06/20 1315     Subjective Covid-19 screen performed up entering clinic. Patient repors feeling better than he thought he would with having good energy today even after anesthesia and procedure yesterday.    Pertinent History CKD, CAD, HTN, history of stroke, CABG x4, lumbar laminectomy/decompression microdiscectomy 02/05/2019    Limitations Sitting;Standing;Walking    Currently in Pain? No/denies    Pain Location Knee    Pain Orientation Right    Pain Descriptors / Indicators Aching    Pain Type Chronic pain    Pain Onset More than a  month ago                               [facility] Adult PT Treatment/Exercise - 10/06/20 0001       Exercises   Exercises Knee/Hip;Lumbar      Lumbar Exercises: Standing   Forward Lunge Limitations One step holds for posture stretch / increase thoracic extension. Bil. arm raises x 5 with each leg forward      Lumbar Exercises: Seated   Sit to Stand 10 reps   with focus on posture and maintaing balance     Knee/Hip Exercises: Aerobic   Nustep L4, seat 11 x15 min      Knee/Hip Exercises: Standing   Other Standing Knee Exercises resisted walking with blue tband retro and fwd    Other Standing Knee Exercises red physioball behind the back at wall with deep breathing to expand ribcage. Added chest stretch with shldr ER and reaching back                         PT Long Term Goals - 09/30/20 0802       PT LONG TERM GOAL #1   Title Patient will be independent with HEP.    Time 4      PT LONG TERM GOAL #2   Title Patient will perform 5x sit to stand test in 15 seconds or less with no UE support to improve balance and improve functional LE strength.    Time 4    Period Weeks      PT LONG TERM GOAL #3   Title Patient will decrease risk of falls as noted by a 4+ improvement on Berg Balance Scale.    Baseline TBD    Time 4    Period Weeks      PT LONG TERM GOAL #4   Title Patient will demonstrate  4+/5 or greater bilateral LE MMT to improve stability during functional tasks.                   Plan - 10/06/20 1348     Clinical Impression Statement No LOB noted today during Rx, but still CGA/SBA during Exs. Rx focused on proprioceptionsitting, and standing posture with focus on deep breathing and rib cage expansion and thoracic extension    Personal Factors and Comorbidities Comorbidity 3+;Age    Comorbidities PD, DM, fall risk, CHF    Examination-Activity Limitations Bed Mobility;Bend;Lift;Carry;Locomotion Level    Stability/Clinical  Decision Making Stable/Uncomplicated    Rehab Potential Good    PT Frequency 2x / week    PT Duration 6 weeks    PT Treatment/Interventions ADLs/Self Care Home Management;Moist Heat;DME Instruction;Gait training;Therapeutic exercise;Therapeutic activities;Neuromuscular re-education;Patient/family education;Manual techniques;Energy conservation             Patient  will benefit from skilled therapeutic intervention in order to improve the following deficits and impairments:  Abnormal gait, Decreased balance, Decreased endurance, Decreased mobility, Impaired vision/preception, Increased edema, Decreased range of motion, Decreased activity tolerance, Impaired flexibility, Postural dysfunction, Pain, Decreased strength  Visit Diagnosis: Difficulty in walking, not elsewhere classified  Repeated falls  Unsteadiness on feet  Muscle weakness (generalized)     Problem List Patient Active Problem List   Diagnosis Date Noted   Dizziness 09/23/2020   History of GI bleed 08/19/2020   Loss of weight 08/19/2020   Dysphagia 08/19/2020   Upper GI bleed 06/25/2020   COVID-19 virus infection 06/25/2020   [name] affect 02/09/2020   Iron deficiency anemia due to chronic blood loss 05/27/2019   Nonrheumatic mitral valve regurgitation 05/26/2019   Left anterior fascicular block 05/26/2019   Educated about COVID-19 virus infection 04/06/2019   Constipation 02/19/2019   Elevated troponin 02/09/2019   Fecal occult blood test positive 02/09/2019   Spinal stenosis, lumbar region with neurogenic claudication 02/05/2019   Leg swelling 01/26/2019   Nonrheumatic aortic valve stenosis 01/26/2019   Multiple joint pain 12/11/2018   Chronic pain of both knees 11/07/2018   Vitamin D deficiency 10/23/2018   Gastroesophageal reflux disease with esophagitis 03/13/2018   Murmur, cardiac 12/04/2017   Coronary artery disease involving native coronary artery of native heart without angina pectoris  12/04/2017   PVC's (premature ventricular contractions) 12/04/2017   Heart disease 08/21/2017   Weakness 11/14/2016   Abnormality of gait 12/06/2015   Abdominal aortic atherosclerosis ([place]) 12/01/2015   Degeneration of lumbar intervertebral disc 12/01/2015   Exertional angina ([place]) 07/20/2015   Hernia of abdominal wall 10/12/2014   Gynecomastia 04/09/2013   Hx of CABG 02/02/2013   Myelodysplastic syndrome 11/06/2012   Vitamin B12 deficiency 07/07/2012   Hyperlipemia 07/07/2012   Peripheral vascular disease ([place]) 04/14/2009   CARCINOMA, PROSTATE 04/13/2009   Non-thrombocytopenic purpura ([place]) 04/13/2009   Benign essential hypertension 04/13/2009   Chronic renal insufficiency, stage III (moderate) ([place]) 04/13/2009   SLEEP APNEA 04/13/2009    Jenicka Coxe,CHRIS, PTA 10/06/2020, 3:54 PM  [facility] [address] [place], [place], [address] Phone: [phone]   Fax:  [phone]  Name: [facility] STFLEUR MRN: [id number] Date of Birth: 1929/02/01

## 2020-10-07 ENCOUNTER — Encounter (HOSPITAL_COMMUNITY): Payer: Self-pay | Admitting: Internal Medicine

## 2020-10-10 ENCOUNTER — Other Ambulatory Visit: Payer: Self-pay | Admitting: Family Medicine

## 2020-10-11 ENCOUNTER — Encounter: Payer: Self-pay | Admitting: Physical Therapy

## 2020-10-11 ENCOUNTER — Other Ambulatory Visit: Payer: Self-pay

## 2020-10-11 ENCOUNTER — Ambulatory Visit: Payer: MEDICARE | Admitting: Physical Therapy

## 2020-10-11 DIAGNOSIS — R262 Difficulty in walking, not elsewhere classified: Secondary | ICD-10-CM

## 2020-10-11 DIAGNOSIS — R2681 Unsteadiness on feet: Secondary | ICD-10-CM

## 2020-10-11 DIAGNOSIS — R296 Repeated falls: Secondary | ICD-10-CM

## 2020-10-11 NOTE — Therapy (Signed)
[facility] [place], [place], [address] Phone: [phone]   Fax:  [phone]  Physical Therapy Treatment  Patient Details  Name: Jon Reid MRN: [id number] Date of Birth: 1928-12-22 Referring Provider (PT): Margette Fast   Encounter Date: 10/11/2020   PT End of Session - 10/11/20 1152     Visit Number 5    Number of Visits 12    Date for PT Re-Evaluation 11/09/20    PT Start Time 1035    PT Stop Time 1114    PT Time Calculation (min) 39 min    Activity Tolerance Patient tolerated treatment well    Behavior During Therapy [facility] for tasks assessed/performed             Past Medical History:  Diagnosis Date   Arthritis    Carcinoma of prostate ([place])    prostate   CKD (chronic kidney disease), stage III ([place])    Coronary artery disease    a. 10/2012 Cath: LM 20-30d, lAD 80p, 75m 80-90d, LCX 90-946mOM1 90, RCA 50p, 2050m0d, RPDA 90, EF nl; b. 10/2012 CABG x 4 (LIMA-LAD, SVG-OM1, SVG-PDA->RPL); c. 07/2015 MV: Basal and proximal septal infarct w/o ischemia. EF 57%-->low risk.   First degree AV block 01/28/2019   Noted on EKG   GERD (gastroesophageal reflux disease)    Gout    HTN (hypertension)    Macrocytosis without anemia 01/08/2012   Moderate aortic stenosis    a. 10/2012 Echo: EF 55-60%, no rwma, BAE, mild AI, mild TR; b. 06/2020 Echo: EF 50-55%, no rwma, mild LVH. Mod dil LA. Mild MR. Mod AS.   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   Sleep apnea    Questionable   Spinal stenosis    [facility]   Past Surgical History:  Procedure Laterality Date   BALLOON DILATION N/A 10/03/2020   Procedure: BALLOON DILATION;  Surgeon: CarEloise HarmanO;  [facility];  Service: Endoscopy;  Laterality: N/A;   CARDIAC CATHETERIZATION  11/07/2012   Dr NahAcie FredricksonCATARACT EXTRACTION W/ INTRAOCULAR LENS IMPLANT Bilateral     CORONARY ARTERY BYPASS GRAFT N/A 11/11/2012   Procedure: CORONARY ARTERY BYPASS GRAFTING times four using Right Greater Saphenous Vein Graft harvested endoscopically and Left Internal Mammary Artery.;  Surgeon: PetIvin PootD;  [facility];  Laterality: N/A;   ESOPHAGEAL BRUSHING  10/03/2020   Procedure: ESOPHAGEAL BRUSHING;  Surgeon: CarEloise HarmanO;  [facility];  Service: Endoscopy;;   ESOPHAGOGASTRODUODENOSCOPY (EGD) WITH PROPOFOL N/A 06/27/2020   Surgeon: CarEloise HarmanO;  large hiatal hernia, LA grade D esophagitis with no bleeding, normal examined duodenum.    ESOPHAGOGASTRODUODENOSCOPY (EGD) WITH PROPOFOL N/A 10/03/2020   Procedure: ESOPHAGOGASTRODUODENOSCOPY (EGD) WITH PROPOFOL;  Surgeon: CarEloise HarmanO;  [facility];  Service: Endoscopy;  Laterality: N/A;  2:00pm   INGUINAL HERNIA REPAIR Right 01/10/2017   Procedure: OPEN RIGHT HERNIA REPAIR INGUINAL;  Surgeon: WhiIleana RoupD;  [facility];  Service: General;  Laterality: Right;   INSERTION OF MESH Right 01/10/2017   Procedure: INSERTION OF MESH;  Surgeon: WhiIleana RoupD;  [facility];  Service: General;  Laterality: Right;   INTRAOPERATIVE TRANSESOPHAGEAL ECHOCARDIOGRAM N/A 11/11/2012   Procedure: INTRAOPERATIVE TRANSESOPHAGEAL ECHOCARDIOGRAM;  Surgeon: PetIvin PootD;  [facility] OR;  Service: Open Heart Surgery;  Laterality: N/A;   LEFT HEART CATHETERIZATION WITH CORONARY ANGIOGRAM N/A 11/07/2012   Procedure: LEFT HEART CATHETERIZATION WITH CORONARY ANGIOGRAM;  Surgeon: Thayer Headings, MD;  Location: [facility] CATH LAB;  Service: Cardiovascular;  Laterality: N/A;   LUMBAR LAMINECTOMY/DECOMPRESSION MICRODISCECTOMY N/A 02/05/2019   Procedure: LUMBAR LAMINECTOMY/DECOMPRESSION L3-L4;  Surgeon: Latanya Maudlin, MD;  [facility];  Service: Orthopedics;  Laterality: N/A;  44mn   LYMPH NODE DISSECTION     Bilateral pelvic    RETROPUBIC PROSTATECTOMY     Radical    There were no vitals filed for this visit.   Subjective Assessment - 10/11/20 1020     Subjective Covid-19 screen performed up entering clinic. States that he can tell he's walking much better now.    Pertinent History CKD, CAD, HTN, history of stroke, CABG x4, lumbar laminectomy/decompression microdiscectomy 02/05/2019    Limitations Sitting;Standing;Walking    Currently in Pain? No/denies                [facility] - 10/11/20 0001       Assessment   Medical Diagnosis Parkinsoism    Referring Provider (PT) CMargette Fast   Next MD Visit N/A    Prior Therapy Yes      Precautions   Precautions FBernerd LimboAdult PT Treatment/Exercise - 10/11/20 0001       Lumbar Exercises: Standing   Forward Lunge Limitations One step holds for posture stretch / increase thoracic extension. Bil. arm raises x10 with each leg forward    Other Standing Lumbar Exercises X to V with ball at wall x20 reps; D2 with ball x15 reps    Other Standing Lumbar Exercises Horizontal abduction, hor. abd with flexion, shoulder flex at wall with ball x20 reps      Knee/Hip Exercises: Aerobic   Nustep L4, seat 11 x15 min      Knee/Hip Exercises: Standing   Other Standing Knee Exercises sidestepping red theraband x5 RT      Knee/Hip Exercises: Seated   Long Arc Quad Strengthening;Both;2 sets;10 reps;Weights    Long Arc Quad Weight 4 lbs.    Clamshell with TheraBand Red   x30 reps   Sit to Sand 15 reps;without UE support                         PT Long Term Goals - 09/30/20 0802       PT LONG TERM GOAL #1   Title Patient will be independent with HEP.    Time 4      PT LONG TERM GOAL #2   Title Patient will perform 5x sit to stand test in 15 seconds or less with no UE support to improve balance and improve functional LE strength.    Time 4    Period Weeks      PT LONG TERM GOAL #3    Title Patient will decrease risk of falls as noted by a 4+ improvement on Berg Balance Scale.    Baseline TBD    Time 4    Period Weeks      PT LONG TERM GOAL #4   Title Patient will demonstrate  4+/5 or greater bilateral LE MMT to improve stability during functional tasks.  Plan - 10/11/20 1150     Clinical Impression Statement Patient presented in clinic with more L ankle edema. Patient required CGA with standing lunges due to instability. Patient progressed through standing postural awareness activities. Patient observed with greater foot clearance and stride lengths bilaterally.    Personal Factors and Comorbidities Comorbidity 3+;Age    Comorbidities PD, DM, fall risk, CHF    Examination-Activity Limitations Bed Mobility;Bend;Lift;Carry;Locomotion Level    Stability/Clinical Decision Making Stable/Uncomplicated    Rehab Potential Good    PT Frequency 2x / week    PT Duration 6 weeks    PT Treatment/Interventions ADLs/Self Care Home Management;Moist Heat;DME Instruction;Gait training;Therapeutic exercise;Therapeutic activities;Neuromuscular re-education;Patient/family education;Manual techniques;Energy conservation    PT Next Visit Plan sit to stand following thoracic mobility activty - modified reclined over physioball    Consulted and Agree with Plan of Care Patient             Patient will benefit from skilled therapeutic intervention in order to improve the following deficits and impairments:  Abnormal gait, Decreased balance, Decreased endurance, Decreased mobility, Impaired vision/preception, Increased edema, Decreased range of motion, Decreased activity tolerance, Impaired flexibility, Postural dysfunction, Pain, Decreased strength  Visit Diagnosis: Difficulty in walking, not elsewhere classified  Repeated falls  Unsteadiness on feet     Problem List Patient Active Problem List   Diagnosis Date Noted   Dizziness 09/23/2020   History  of GI bleed 08/19/2020   Loss of weight 08/19/2020   Dysphagia 08/19/2020   Upper GI bleed 06/25/2020   COVID-19 virus infection 06/25/2020   [name] affect 02/09/2020   Iron deficiency anemia due to chronic blood loss 05/27/2019   Nonrheumatic mitral valve regurgitation 05/26/2019   Left anterior fascicular block 05/26/2019   Educated about COVID-19 virus infection 04/06/2019   Constipation 02/19/2019   Elevated troponin 02/09/2019   Fecal occult blood test positive 02/09/2019   Spinal stenosis, lumbar region with neurogenic claudication 02/05/2019   Leg swelling 01/26/2019   Nonrheumatic aortic valve stenosis 01/26/2019   Multiple joint pain 12/11/2018   Chronic pain of both knees 11/07/2018   Vitamin D deficiency 10/23/2018   Gastroesophageal reflux disease with esophagitis 03/13/2018   Murmur, cardiac 12/04/2017   Coronary artery disease involving native coronary artery of native heart without angina pectoris 12/04/2017   PVC's (premature ventricular contractions) 12/04/2017   Heart disease 08/21/2017   Weakness 11/14/2016   Abnormality of gait 12/06/2015   Abdominal aortic atherosclerosis ([place]) 12/01/2015   Degeneration of lumbar intervertebral disc 12/01/2015   Exertional angina (HCC) 07/20/2015   Hernia of abdominal wall 10/12/2014   Gynecomastia 04/09/2013   Hx of CABG 02/02/2013   Myelodysplastic syndrome 11/06/2012   Vitamin B12 deficiency 07/07/2012   Hyperlipemia 07/07/2012   Peripheral vascular disease ([place]) 04/14/2009   CARCINOMA, PROSTATE 04/13/2009   Non-thrombocytopenic purpura ([place]) 04/13/2009   Benign essential hypertension 04/13/2009   Chronic renal insufficiency, stage III (moderate) ([place]) 04/13/2009   SLEEP APNEA 04/13/2009    Standley Brooking, PTA 10/11/2020, 11:53 AM  [facility] [address] [place], [place], [address] Phone: [phone]   Fax:  [phone]  Name: Jon Reid MRN:  [id number] Date of Birth: 02-Jun-1928

## 2020-10-13 ENCOUNTER — Ambulatory Visit: Payer: MEDICARE | Admitting: Physical Therapy

## 2020-10-13 ENCOUNTER — Encounter: Payer: Self-pay | Admitting: Physical Therapy

## 2020-10-13 ENCOUNTER — Other Ambulatory Visit: Payer: Self-pay

## 2020-10-13 DIAGNOSIS — R262 Difficulty in walking, not elsewhere classified: Secondary | ICD-10-CM | POA: Diagnosis not present

## 2020-10-13 DIAGNOSIS — R2681 Unsteadiness on feet: Secondary | ICD-10-CM

## 2020-10-13 DIAGNOSIS — R296 Repeated falls: Secondary | ICD-10-CM

## 2020-10-13 NOTE — Therapy (Signed)
[facility] [place], [place], [address] Phone: [phone]   Fax:  [phone]  Physical Therapy Treatment  Patient Details  Name: Jon Reid MRN: [id number] Date of Birth: [birth year] Referring Provider (PT): Margette Fast   Encounter Date: 10/13/2020   PT End of Session - 10/13/20 1151     Visit Number 6    Number of Visits 12    Date for PT Re-Evaluation 11/09/20    PT Start Time 1033    PT Stop Time 1114    PT Time Calculation (min) 41 min    Activity Tolerance Patient tolerated treatment well    Behavior During Therapy [facility] for tasks assessed/performed             Past Medical History:  Diagnosis Date   Arthritis    Carcinoma of prostate ([place])    prostate   CKD (chronic kidney disease), stage III ([place])    Coronary artery disease    a. 10/2012 Cath: LM 20-30d, lAD 80p, 19m 80-90d, LCX 90-92mOM1 90, RCA 50p, 2090m0d, RPDA 90, EF nl; b. 10/2012 CABG x 4 (LIMA-LAD, SVG-OM1, SVG-PDA->RPL); c. 07/2015 MV: Basal and proximal septal infarct w/o ischemia. EF 57%-->low risk.   First degree AV block 01/28/2019   Noted on EKG   GERD (gastroesophageal reflux disease)    Gout    HTN (hypertension)    Macrocytosis without anemia 01/08/2012   Moderate aortic stenosis    a. 10/2012 Echo: EF 55-60%, no rwma, BAE, mild AI, mild TR; b. 06/2020 Echo: EF 50-55%, no rwma, mild LVH. Mod dil LA. Mild MR. Mod AS.   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   Sleep apnea    Questionable   Spinal stenosis    [facility]   Past Surgical History:  Procedure Laterality Date   BALLOON DILATION N/A 10/03/2020   Procedure: BALLOON DILATION;  Surgeon: CarEloise HarmanO;  [facility];  Service: Endoscopy;  Laterality: N/A;   CARDIAC CATHETERIZATION  11/07/2012   Dr NahAcie FredricksonCATARACT EXTRACTION W/ INTRAOCULAR LENS IMPLANT Bilateral     CORONARY ARTERY BYPASS GRAFT N/A 11/11/2012   Procedure: CORONARY ARTERY BYPASS GRAFTING times four using Right Greater Saphenous Vein Graft harvested endoscopically and Left Internal Mammary Artery.;  Surgeon: PetIvin PootD;  [facility] [place];  Laterality: N/A;   ESOPHAGEAL BRUSHING  10/03/2020   Procedure: ESOPHAGEAL BRUSHING;  Surgeon: CarEloise HarmanO;  [facility];  Service: Endoscopy;;   ESOPHAGOGASTRODUODENOSCOPY (EGD) WITH PROPOFOL N/A 06/27/2020   Surgeon: CarEloise HarmanO;  large hiatal hernia, LA grade D esophagitis with no bleeding, normal examined duodenum.    ESOPHAGOGASTRODUODENOSCOPY (EGD) WITH PROPOFOL N/A 10/03/2020   Procedure: ESOPHAGOGASTRODUODENOSCOPY (EGD) WITH PROPOFOL;  Surgeon: CarEloise HarmanO;  [facility];  Service: Endoscopy;  Laterality: N/A;  2:00pm   INGUINAL HERNIA REPAIR Right 01/10/2017   Procedure: OPEN RIGHT HERNIA REPAIR INGUINAL;  Surgeon: WhiIleana RoupD;  [facility];  Service: General;  Laterality: Right;   INSERTION OF MESH Right 01/10/2017   Procedure: INSERTION OF MESH;  Surgeon: WhiIleana RoupD;  [facility];  Service: General;  Laterality: Right;   INTRAOPERATIVE TRANSESOPHAGEAL ECHOCARDIOGRAM N/A 11/11/2012   Procedure: INTRAOPERATIVE TRANSESOPHAGEAL ECHOCARDIOGRAM;  Surgeon: PetIvin PootD;  [facility];  Service: Open Heart Surgery;  Laterality: N/A;   LEFT HEART CATHETERIZATION WITH CORONARY ANGIOGRAM N/A 11/07/2012   Procedure: LEFT HEART CATHETERIZATION WITH CORONARY ANGIOGRAM;  Surgeon: Thayer Headings, MD;  Location: [facility] CATH LAB;  Service: Cardiovascular;  Laterality: N/A;   LUMBAR LAMINECTOMY/DECOMPRESSION MICRODISCECTOMY N/A 02/05/2019   Procedure: LUMBAR LAMINECTOMY/DECOMPRESSION L3-L4;  Surgeon: Latanya Maudlin, MD;  [facility];  Service: Orthopedics;  Laterality: N/A;  22mn   LYMPH NODE DISSECTION     Bilateral pelvic    RETROPUBIC PROSTATECTOMY     Radical    There were no vitals filed for this visit.   Subjective Assessment - 10/13/20 1025     Subjective Covid-19 screen performed up entering clinic. No new complaints.    Pertinent History CKD, CAD, HTN, history of stroke, CABG x4, lumbar laminectomy/decompression microdiscectomy 02/05/2019    Limitations Sitting;Standing;Walking    Currently in Pain? No/denies                [facility]PT [facility] - 10/13/20 0001       Assessment   Medical Diagnosis Parkinsoism    Referring Provider (PT) CMargette Fast   Next MD Visit N/A    Prior Therapy Yes      Precautions   Precautions FBernerd LimboAdult PT Treatment/Exercise - 10/13/20 0001       Lumbar Exercises: Seated   Other Seated Lumbar Exercises Opp arm/opp leg marching x15 reps;    Other Seated Lumbar Exercises X to V x15 reps      Knee/Hip Exercises: Aerobic   Nustep L3, seat 11 x18 min      Knee/Hip Exercises: Machines for Strengthening   Cybex Knee Extension 10# 3x10 reps    Cybex Knee Flexion 30# 3x10 reps      Knee/Hip Exercises: Standing   Forward Lunges Both;15 reps;2 seconds   with OH BIG   Hip Abduction AROM;Both;20 reps;Knee straight   sidestep with BIG motion   Other Standing Knee Exercises sidestepping yellow theraband/ big steps x5                         PT Long Term Goals - 09/30/20 0802       PT LONG TERM GOAL #1   Title Patient will be independent with HEP.    Time 4      PT LONG TERM GOAL #2   Title Patient will perform 5x sit to stand test in 15 seconds or less with no UE support to improve balance and improve functional LE strength.    Time 4    Period Weeks      PT LONG TERM GOAL #3   Title Patient will decrease risk of falls as noted by a 4+ improvement on Berg Balance Scale.    Baseline TBD    Time 4    Period Weeks      PT LONG TERM GOAL #4   Title Patient will demonstrate  4+/5 or  greater bilateral LE MMT to improve stability during functional tasks.                   Plan - 10/13/20 1151     Clinical Impression Statement Patient presented in clinic with no complaints. Patient progressed to more BIG movements with repeated verbal  emphasis on larger steps and UE movement. Patient also observed ambulating between exercises with small stride lengths but was able to correct after VCs for short period until his attention was elsewhere. Patient also wearing slip in loafers that are a few sizes too big due to ankle swelling L > R.    Personal Factors and Comorbidities Comorbidity 3+;Age    Comorbidities PD, DM, fall risk, CHF    Examination-Activity Limitations Bed Mobility;Bend;Lift;Carry;Locomotion Level    Stability/Clinical Decision Making Stable/Uncomplicated    Rehab Potential Good    PT Frequency 2x / week    PT Duration 6 weeks    PT Treatment/Interventions ADLs/Self Care Home Management;Moist Heat;DME Instruction;Gait training;Therapeutic exercise;Therapeutic activities;Neuromuscular re-education;Patient/family education;Manual techniques;Energy conservation    PT Next Visit Plan sit to stand following thoracic mobility activty - modified reclined over physioball    Consulted and Agree with Plan of Care Patient             Patient will benefit from skilled therapeutic intervention in order to improve the following deficits and impairments:  Abnormal gait, Decreased balance, Decreased endurance, Decreased mobility, Impaired vision/preception, Increased edema, Decreased range of motion, Decreased activity tolerance, Impaired flexibility, Postural dysfunction, Pain, Decreased strength  Visit Diagnosis: Difficulty in walking, not elsewhere classified  Repeated falls  Unsteadiness on feet     Problem List Patient Active Problem List   Diagnosis Date Noted   Dizziness 09/23/2020   History of GI bleed 08/19/2020   Loss of weight 08/19/2020    Dysphagia 08/19/2020   Upper GI bleed 06/25/2020   COVID-19 virus infection 06/25/2020   [name] affect 02/09/2020   Iron deficiency anemia due to chronic blood loss 05/27/2019   Nonrheumatic mitral valve regurgitation 05/26/2019   Left anterior fascicular block 05/26/2019   Educated about COVID-19 virus infection 04/06/2019   Constipation 02/19/2019   Elevated troponin 02/09/2019   Fecal occult blood test positive 02/09/2019   Spinal stenosis, lumbar region with neurogenic claudication 02/05/2019   Leg swelling 01/26/2019   Nonrheumatic aortic valve stenosis 01/26/2019   Multiple joint pain 12/11/2018   Chronic pain of both knees 11/07/2018   Vitamin D deficiency 10/23/2018   Gastroesophageal reflux disease with esophagitis 03/13/2018   Murmur, cardiac 12/04/2017   Coronary artery disease involving native coronary artery of native heart without angina pectoris 12/04/2017   PVC's (premature ventricular contractions) 12/04/2017   Heart disease 08/21/2017   Weakness 11/14/2016   Abnormality of gait 12/06/2015   Abdominal aortic atherosclerosis ([place]) 12/01/2015   Degeneration of lumbar intervertebral disc 12/01/2015   Exertional angina (HCC) 07/20/2015   Hernia of abdominal wall 10/12/2014   Gynecomastia 04/09/2013   Hx of CABG 02/02/2013   Myelodysplastic syndrome 11/06/2012   Vitamin B12 deficiency 07/07/2012   Hyperlipemia 07/07/2012   Peripheral vascular disease ([place]) 04/14/2009   CARCINOMA, PROSTATE 04/13/2009   Non-thrombocytopenic purpura ([place]) 04/13/2009   Benign essential hypertension 04/13/2009   Chronic renal insufficiency, stage III (moderate) ([place]) 04/13/2009   SLEEP APNEA 04/13/2009    Standley Brooking, PTA 10/13/2020, 11:59 AM  [facility] [address] [place], [place], [address] Phone: [phone]   Fax:  [phone]  Name: Jon Reid MRN: [id number] Date of Birth: 1928-04-19

## 2020-10-17 ENCOUNTER — Encounter: Payer: Self-pay | Admitting: Internal Medicine

## 2020-10-17 ENCOUNTER — Ambulatory Visit: Payer: MEDICARE | Attending: Neurology | Admitting: Physical Therapy

## 2020-10-17 ENCOUNTER — Other Ambulatory Visit: Payer: Self-pay

## 2020-10-17 DIAGNOSIS — R2681 Unsteadiness on feet: Secondary | ICD-10-CM | POA: Diagnosis not present

## 2020-10-17 DIAGNOSIS — R262 Difficulty in walking, not elsewhere classified: Secondary | ICD-10-CM | POA: Diagnosis not present

## 2020-10-17 DIAGNOSIS — M6281 Muscle weakness (generalized): Secondary | ICD-10-CM | POA: Insufficient documentation

## 2020-10-17 DIAGNOSIS — R296 Repeated falls: Secondary | ICD-10-CM | POA: Diagnosis not present

## 2020-10-17 NOTE — Therapy (Signed)
[facility] [place], [place], [address] Phone: [phone]   Fax:  [phone]  Physical Therapy Treatment  Patient Details  Name: Jon Reid MRN: [id number] Date of Birth: 1928-04-07 Referring Provider (PT): Margette Fast   Encounter Date: 10/17/2020   PT End of Session - 10/17/20 1133     Visit Number 7    Number of Visits 12    Date for PT Re-Evaluation 11/09/20    PT Start Time 1030    PT Stop Time 1112    PT Time Calculation (min) 42 min    Activity Tolerance Patient tolerated treatment well    Behavior During Therapy [facility] for tasks assessed/performed             Past Medical History:  Diagnosis Date   Arthritis    Carcinoma of prostate ([place])    prostate   CKD (chronic kidney disease), stage III ([place])    Coronary artery disease    a. 10/2012 Cath: LM 20-30d, lAD 80p, 54m 80-90d, LCX 90-913mOM1 90, RCA 50p, 2034m0d, RPDA 90, EF nl; b. 10/2012 CABG x 4 (LIMA-LAD, SVG-OM1, SVG-PDA->RPL); c. 07/2015 MV: Basal and proximal septal infarct w/o ischemia. EF 57%-->low risk.   First degree AV block 01/28/2019   Noted on EKG   GERD (gastroesophageal reflux disease)    Gout    HTN (hypertension)    Macrocytosis without anemia 01/08/2012   Moderate aortic stenosis    a. 10/2012 Echo: EF 55-60%, no rwma, BAE, mild AI, mild TR; b. 06/2020 Echo: EF 50-55%, no rwma, mild LVH. Mod dil LA. Mild MR. Mod AS.   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   Noted on EKG   S/Reid CABG x 4 02/02/2013   Sleep apnea    Questionable   Spinal stenosis    [facility]   Past Surgical History:  Procedure Laterality Date   BALLOON DILATION N/A 10/03/2020   Procedure: BALLOON DILATION;  Surgeon: CarEloise HarmanO;  [facility];  Service: Endoscopy;  Laterality: N/A;   CARDIAC CATHETERIZATION  11/07/2012   Dr NahAcie FredricksonCATARACT EXTRACTION W/ INTRAOCULAR LENS IMPLANT Bilateral     CORONARY ARTERY BYPASS GRAFT N/A 11/11/2012   Procedure: CORONARY ARTERY BYPASS GRAFTING times four using Right Greater Saphenous Vein Graft harvested endoscopically and Left Internal Mammary Artery.;  Surgeon: PetIvin PootD;  [facility];  Laterality: N/A;   ESOPHAGEAL BRUSHING  10/03/2020   Procedure: ESOPHAGEAL BRUSHING;  Surgeon: CarEloise HarmanO;  [facility];  Service: Endoscopy;;   ESOPHAGOGASTRODUODENOSCOPY (EGD) WITH PROPOFOL N/A 06/27/2020   Surgeon: CarEloise HarmanO;  large hiatal hernia, LA grade D esophagitis with no bleeding, normal examined duodenum.    ESOPHAGOGASTRODUODENOSCOPY (EGD) WITH PROPOFOL N/A 10/03/2020   Procedure: ESOPHAGOGASTRODUODENOSCOPY (EGD) WITH PROPOFOL;  Surgeon: CarEloise HarmanO;  [facility];  Service: Endoscopy;  Laterality: N/A;  2:00pm   INGUINAL HERNIA REPAIR Right 01/10/2017   Procedure: OPEN RIGHT HERNIA REPAIR INGUINAL;  Surgeon: WhiIleana RoupD;  [facility] OR[facility];  Service: General;  Laterality: Right;   INSERTION OF MESH Right 01/10/2017   Procedure: INSERTION OF MESH;  Surgeon: WhiIleana RoupD;  [facility];  Service: General;  Laterality: Right;   INTRAOPERATIVE TRANSESOPHAGEAL ECHOCARDIOGRAM N/A 11/11/2012   Procedure: INTRAOPERATIVE TRANSESOPHAGEAL ECHOCARDIOGRAM;  Surgeon: PetIvin PootD;  [facility] OR;  Service: Open Heart Surgery;  Laterality: N/A;   LEFT HEART CATHETERIZATION WITH CORONARY ANGIOGRAM N/A 11/07/2012   Procedure: LEFT HEART CATHETERIZATION WITH CORONARY ANGIOGRAM;  Surgeon: Thayer Headings, MD;  Location: [facility] CATH LAB;  Service: Cardiovascular;  Laterality: N/A;   LUMBAR LAMINECTOMY/DECOMPRESSION MICRODISCECTOMY N/A 02/05/2019   Procedure: LUMBAR LAMINECTOMY/DECOMPRESSION L3-L4;  Surgeon: Latanya Maudlin, MD;  [facility];  Service: Orthopedics;  Laterality: N/A;  37mn   LYMPH NODE DISSECTION     Bilateral pelvic    RETROPUBIC PROSTATECTOMY     Radical    There were no vitals filed for this visit.   Subjective Assessment - 10/17/20 1049     Subjective Covid-19 screen performed up entering clinic. Patient arrived doing well with no falls reported    Pertinent History CKD, CAD, HTN, history of stroke, CABG x4, lumbar laminectomy/decompression microdiscectomy 02/05/2019    Limitations Sitting;Standing;Walking    Currently in Pain? No/denies                               [facility] - 10/17/20 0001       Berg Balance Test   Sit to Stand Able to stand without using hands and stabilize independently    Standing Unsupported Able to stand safely 2 minutes    Sitting with Back Unsupported but Feet Supported on Floor or Stool Able to sit safely and securely 2 minutes    Stand to Sit Sits safely with minimal use of hands    Transfers Able to transfer safely, minor use of hands    Standing Unsupported with Eyes Closed Able to stand 10 seconds safely    Standing Ubsupported with Feet Together Able to place feet together independently and stand 1 minute safely    From Standing, Reach Forward with Outstretched Arm Can reach forward >12 cm safely (5")    From Standing Position, Pick up Object from Floor Able to pick up shoe safely and easily    From Standing Position, Turn to Look Behind Over each Shoulder Turn sideways only but maintains balance    Turn 360 Degrees Able to turn 360 degrees safely one side only in 4 seconds or less    Standing Unsupported, Alternately Place Feet on Step/Stool Able to complete 4 steps without aid or supervision    Standing Unsupported, One Foot in Front Needs help to step but can hold 15 seconds    Standing on One Leg Unable to try or needs assist to prevent fall    Total Score 43      Knee/Hip Exercises: Aerobic   Nustep L3, seat 11 x18 min      Knee/Hip Exercises: Machines for Strengthening   Cybex Knee Extension 10# x 273m     Cybex Knee Flexion 30# x2m70m                Balance Exercises - 10/17/20 0001       Balance Exercises: Standing   Standing Eyes Opened Wide (BOA);Foam/compliant surface   with reaching overhead with CGA/SBA   Tandem Stance Eyes open;Intermittent upper extremity support;5 reps;1 rep;10 secs    Standing, One Foot on a Step Eyes open;6 inch;4 reps;10 secs    Tandem Gait Forward;Retro;Intermittent upper extremity support;5 reps;1 rep    Partial Tandem Stance Eyes open;Intermittent upper extremity support;4 reps    Retro Gait Upper extremity support;4 reps    Sidestepping Upper  extremity support;5 reps;1 rep    Marching Foam/compliant surface;Upper extremity assist 1   2x10   Heel Raises Both;20 reps    Sit to Stand Standard surface;Upper extremity support   x10   Other Standing Exercises standing step outs with reaching overhead opposite UE/LE                    PT Long Term Goals - 10/17/20 1056       PT LONG TERM GOAL #1   Title Patient will be independent with HEP.    Time 4    Period Weeks    Status On-going      PT LONG TERM GOAL #2   Title Patient will perform 5x sit to stand test in 15 seconds or less with no UE support to improve balance and improve functional LE strength.    Baseline more than 20 seconds yet good form today 10/17/20    Time 4    Period Weeks    Status On-going      PT LONG TERM GOAL #3   Title Patient will decrease risk of falls as noted by a 4+ improvement on Berg Balance Scale.    Baseline 43/56 today increased by 1 point 10/17/20    Time 4    Period Weeks    Status On-going      PT LONG TERM GOAL #4   Title Patient will demonstrate  4+/5 or greater bilateral LE MMT to improve stability during functional tasks.    Time 4    Period Weeks    Status On-going                   Plan - 10/17/20 1128     Clinical Impression Statement Patient tolerated treatment well today. No falls reported and doing well per reported.  Patient able to complete all balance exercises with SBA/CGA for safety. Patient required visusl and verbal educucational cues for techniwue with exercises. Patient improved with BERG score by 1 point and sit to stands slow pace yet good technique. Goals progressing this week.    Personal Factors and Comorbidities Comorbidity 3+;Age    Comorbidities PD, DM, fall risk, CHF    Examination-Activity Limitations Bed Mobility;Bend;Lift;Carry;Locomotion Level    Stability/Clinical Decision Making Stable/Uncomplicated    Rehab Potential Good    PT Frequency 2x / week    PT Duration 6 weeks    PT Treatment/Interventions ADLs/Self Care Home Management;Moist Heat;DME Instruction;Gait training;Therapeutic exercise;Therapeutic activities;Neuromuscular re-education;Patient/family education;Manual techniques;Energy conservation    PT Next Visit Plan cont with POC for balance and strength/sit to stand following thoracic mobility activty - modified reclined over physioball    Consulted and Agree with Plan of Care Patient             Patient will benefit from skilled therapeutic intervention in order to improve the following deficits and impairments:  Abnormal gait, Decreased balance, Decreased endurance, Decreased mobility, Impaired vision/preception, Increased edema, Decreased range of motion, Decreased activity tolerance, Impaired flexibility, Postural dysfunction, Pain, Decreased strength  Visit Diagnosis: Difficulty in walking, not elsewhere classified  Repeated falls  Unsteadiness on feet  Muscle weakness (generalized)     Problem List Patient Active Problem List   Diagnosis Date Noted   Dizziness 09/23/2020   History of GI bleed 08/19/2020   Loss of weight 08/19/2020   Dysphagia 08/19/2020   Upper GI bleed 06/25/2020   COVID-19 virus infection 06/25/2020   [name] affect 02/09/2020   Iron deficiency  anemia due to chronic blood loss 05/27/2019   Nonrheumatic mitral valve  regurgitation 05/26/2019   Left anterior fascicular block 05/26/2019   Educated about COVID-19 virus infection 04/06/2019   Constipation 02/19/2019   Elevated troponin 02/09/2019   Fecal occult blood test positive 02/09/2019   Spinal stenosis, lumbar region with neurogenic claudication 02/05/2019   Leg swelling 01/26/2019   Nonrheumatic aortic valve stenosis 01/26/2019   Multiple joint pain 12/11/2018   Chronic pain of both knees 11/07/2018   Vitamin D deficiency 10/23/2018   Gastroesophageal reflux disease with esophagitis 03/13/2018   Murmur, cardiac 12/04/2017   Coronary artery disease involving native coronary artery of native heart without angina pectoris 12/04/2017   PVC's (premature ventricular contractions) 12/04/2017   Heart disease 08/21/2017   Weakness 11/14/2016   Abnormality of gait 12/06/2015   Abdominal aortic atherosclerosis ([place]) 12/01/2015   Degeneration of lumbar intervertebral disc 12/01/2015   Exertional angina ([place]) 07/20/2015   Hernia of abdominal wall 10/12/2014   Gynecomastia 04/09/2013   Hx of CABG 02/02/2013   Myelodysplastic syndrome 11/06/2012   Vitamin B12 deficiency 07/07/2012   Hyperlipemia 07/07/2012   Peripheral vascular disease ([place]) 04/14/2009   CARCINOMA, PROSTATE 04/13/2009   Non-thrombocytopenic purpura ([place]) 04/13/2009   Benign essential hypertension 04/13/2009   Chronic renal insufficiency, stage III (moderate) ([place]) 04/13/2009   SLEEP APNEA 04/13/2009    Jon Reid, PTA 10/17/2020, 11:36 AM  [facility] [address] [place], [place], [address] Phone: [phone]   Fax:  [phone]  Name: Jon Reid MRN: [id number] Date of Birth: 1928-10-14

## 2020-10-19 ENCOUNTER — Ambulatory Visit: Payer: MEDICARE | Admitting: Physical Therapy

## 2020-10-19 ENCOUNTER — Other Ambulatory Visit: Payer: Self-pay

## 2020-10-19 DIAGNOSIS — R262 Difficulty in walking, not elsewhere classified: Secondary | ICD-10-CM | POA: Diagnosis not present

## 2020-10-19 DIAGNOSIS — R2681 Unsteadiness on feet: Secondary | ICD-10-CM

## 2020-10-19 DIAGNOSIS — R296 Repeated falls: Secondary | ICD-10-CM

## 2020-10-19 DIAGNOSIS — M6281 Muscle weakness (generalized): Secondary | ICD-10-CM

## 2020-10-19 NOTE — Therapy (Signed)
[facility] [place], [place], [address] Phone: [phone]   Fax:  [phone]  Physical Therapy Treatment  Patient Details  Name: Jon Reid MRN: [id number] Date of Birth: [birth year] Referring Provider (PT): Margette Fast   Encounter Date: 10/19/2020   PT End of Session - 10/19/20 1040     Visit Number 8    Number of Visits 12    Date for PT Re-Evaluation 11/09/20    PT Start Time 1030    PT Stop Time 1113    PT Time Calculation (min) 43 min    Activity Tolerance Patient tolerated treatment well    Behavior During Therapy [facility] for tasks assessed/performed             Past Medical History:  Diagnosis Date   Arthritis    Carcinoma of prostate ([place])    prostate   CKD (chronic kidney disease), stage III ([place])    Coronary artery disease    a. 10/2012 Cath: LM 20-30d, lAD 80p, 40m 80-90d, LCX 90-98mOM1 90, RCA 50p, 2082m0d, RPDA 90, EF nl; b. 10/2012 CABG x 4 (LIMA-LAD, SVG-OM1, SVG-PDA->RPL); c. 07/2015 MV: Basal and proximal septal infarct w/o ischemia. EF 57%-->low risk.   First degree AV block 01/28/2019   Noted on EKG   GERD (gastroesophageal reflux disease)    Gout    HTN (hypertension)    Macrocytosis without anemia 01/08/2012   Moderate aortic stenosis    a. 10/2012 Echo: EF 55-60%, no rwma, BAE, mild AI, mild TR; b. 06/2020 Echo: EF 50-55%, no rwma, mild LVH. Mod dil LA. Mild MR. Mod AS.   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   Noted on EKG   S/Reid CABG x 4 02/02/2013   Sleep apnea    Questionable   Spinal stenosis    [facility]   Past Surgical History:  Procedure Laterality Date   BALLOON DILATION N/A 10/03/2020   Procedure: BALLOON DILATION;  Surgeon: CarEloise HarmanO;  [facility];  Service: Endoscopy;  Laterality: N/A;   CARDIAC CATHETERIZATION  11/07/2012   Dr NahAcie FredricksonCATARACT EXTRACTION W/ INTRAOCULAR LENS IMPLANT Bilateral     CORONARY ARTERY BYPASS GRAFT N/A 11/11/2012   Procedure: CORONARY ARTERY BYPASS GRAFTING times four using Right Greater Saphenous Vein Graft harvested endoscopically and Left Internal Mammary Artery.;  Surgeon: PetIvin PootD;  [facility];  Laterality: N/A;   ESOPHAGEAL BRUSHING  10/03/2020   Procedure: ESOPHAGEAL BRUSHING;  Surgeon: CarEloise HarmanO;  [facility];  Service: Endoscopy;;   ESOPHAGOGASTRODUODENOSCOPY (EGD) WITH PROPOFOL N/A 06/27/2020   Surgeon: CarEloise HarmanO;  large hiatal hernia, LA grade D esophagitis with no bleeding, normal examined duodenum.    ESOPHAGOGASTRODUODENOSCOPY (EGD) WITH PROPOFOL N/A 10/03/2020   Procedure: ESOPHAGOGASTRODUODENOSCOPY (EGD) WITH PROPOFOL;  Surgeon: CarEloise HarmanO;  [facility];  Service: Endoscopy;  Laterality: N/A;  2:00pm   INGUINAL HERNIA REPAIR Right 01/10/2017   Procedure: OPEN RIGHT HERNIA REPAIR INGUINAL;  Surgeon: WhiIleana RoupD;  [facility];  Service: General;  Laterality: Right;   INSERTION OF MESH Right 01/10/2017   Procedure: INSERTION OF MESH;  Surgeon: WhiIleana RoupD;  [facility];  Service: General;  Laterality: Right;   INTRAOPERATIVE TRANSESOPHAGEAL ECHOCARDIOGRAM N/A 11/11/2012   Procedure: INTRAOPERATIVE TRANSESOPHAGEAL ECHOCARDIOGRAM;  Surgeon: PetIvin PootD;  [facility];  Service: Open Heart Surgery;  Laterality: N/A;   LEFT HEART CATHETERIZATION WITH CORONARY ANGIOGRAM N/A 11/07/2012   Procedure: LEFT HEART CATHETERIZATION WITH CORONARY ANGIOGRAM;  Surgeon: Thayer Headings, MD;  Location: [facility] CATH LAB;  Service: Cardiovascular;  Laterality: N/A;   LUMBAR LAMINECTOMY/DECOMPRESSION MICRODISCECTOMY N/A 02/05/2019   Procedure: LUMBAR LAMINECTOMY/DECOMPRESSION L3-L4;  Surgeon: Latanya Maudlin, MD;  [facility];  Service: Orthopedics;  Laterality: N/A;  100mn   LYMPH NODE DISSECTION     Bilateral pelvic    RETROPUBIC PROSTATECTOMY     Radical    There were no vitals filed for this visit.   Subjective Assessment - 10/19/20 1039     Subjective Covid-19 screen performed upon entering clinic. Patient reported doing good upon arrival.    Pertinent History CKD, CAD, HTN, history of stroke, CABG x4, lumbar laminectomy/decompression microdiscectomy 02/05/2019    Limitations Sitting;Standing;Walking    Currently in Pain? No/denies                               [facility] - [facility]       Knee/Hip Exercises: Aerobic   Nustep L3, seat 11 x18 min      Knee/Hip Exercises: Machines for Strengthening   Cybex Knee Extension 10# x 373m    Cybex Knee Flexion 30# x3m48m                Balance Exercises - 10/19/20 0001       Balance Exercises: Standing   Standing Eyes Opened Narrow base of support (BOS);Wide (BOA);Foam/compliant surface;Head turns   with trunk rotation and reaching overhead   Standing, One Foot on a Step Eyes open;8 inch;10 secs;4 reps    Retro Gait Upper extremity support;4 reps    Sidestepping Upper extremity support;5 reps;1 rep    Marching Foam/compliant surface;Upper extremity assist 1;20 reps    Heel Raises 20 reps   on airex   Sit to Stand Standard surface   x15   Other Standing Exercises step over balance pods x 6                    PT Long Term Goals - 10/17/20 1056       PT LONG TERM GOAL #1   Title Patient will be independent with HEP.    Time 4    Period Weeks    Status On-going      PT LONG TERM GOAL #2   Title Patient will perform 5x sit to stand test in 15 seconds or less with no UE support to improve balance and improve functional LE strength.    Baseline more than 20 seconds yet good form today 10/17/20    Time 4    Period Weeks    Status On-going      PT LONG TERM GOAL #3   Title Patient will decrease risk of falls as noted by a 4+ improvement on Berg Balance Scale.    Baseline  43/56 today increased by 1 point 10/17/20    Time 4    Period Weeks    Status On-going      PT LONG TERM GOAL #4   Title Patient will demonstrate  4+/5 or greater bilateral LE MMT to improve stability during functional tasks.    Time 4    Period Weeks    Status On-going  Plan - 10/19/20 1044     Clinical Impression Statement Patient tolerated treatment well today. Patient continues to progress with all exercises yet some ongoing balance limitations and rquires SBA/CGA for safety. Today minimal LOB with balance activities and able to use parallel bars for assit. Patient current goals progressing this week.    Personal Factors and Comorbidities Comorbidity 3+;Age    Comorbidities PD, DM, fall risk, CHF    Examination-Activity Limitations Bed Mobility;Bend;Lift;Carry;Locomotion Level    Stability/Clinical Decision Making Stable/Uncomplicated    Rehab Potential Good    PT Frequency 2x / week    PT Duration 6 weeks    PT Treatment/Interventions ADLs/Self Care Home Management;Moist Heat;DME Instruction;Gait training;Therapeutic exercise;Therapeutic activities;Neuromuscular re-education;Patient/family education;Manual techniques;Energy conservation    PT Next Visit Plan cont with POC for balance and strength/sit to stand following thoracic mobility activty - modified reclined over physioball    Consulted and Agree with Plan of Care Patient             Patient will benefit from skilled therapeutic intervention in order to improve the following deficits and impairments:  Abnormal gait, Decreased balance, Decreased endurance, Decreased mobility, Impaired vision/preception, Increased edema, Decreased range of motion, Decreased activity tolerance, Impaired flexibility, Postural dysfunction, Pain, Decreased strength  Visit Diagnosis: Difficulty in walking, not elsewhere classified  Repeated falls  Unsteadiness on feet  Muscle weakness  (generalized)     Problem List Patient Active Problem List   Diagnosis Date Noted   Dizziness 09/23/2020   History of GI bleed 08/19/2020   Loss of weight 08/19/2020   Dysphagia 08/19/2020   Upper GI bleed 06/25/2020   COVID-19 virus infection 06/25/2020   [name] affect 02/09/2020   Iron deficiency anemia due to chronic blood loss 05/27/2019   Nonrheumatic mitral valve regurgitation 05/26/2019   Left anterior fascicular block 05/26/2019   Educated about COVID-19 virus infection 04/06/2019   Constipation 02/19/2019   Elevated troponin 02/09/2019   Fecal occult blood test positive 02/09/2019   Spinal stenosis, lumbar region with neurogenic claudication 02/05/2019   Leg swelling 01/26/2019   Nonrheumatic aortic valve stenosis 01/26/2019   Multiple joint pain 12/11/2018   Chronic pain of both knees 11/07/2018   Vitamin D deficiency 10/23/2018   Gastroesophageal reflux disease with esophagitis 03/13/2018   Murmur, cardiac 12/04/2017   Coronary artery disease involving native coronary artery of native heart without angina pectoris 12/04/2017   PVC's (premature ventricular contractions) 12/04/2017   Heart disease 08/21/2017   Weakness 11/14/2016   Abnormality of gait 12/06/2015   Abdominal aortic atherosclerosis ([place]) 12/01/2015   Degeneration of lumbar intervertebral disc 12/01/2015   Exertional angina ([place]) 07/20/2015   Hernia of abdominal wall 10/12/2014   Gynecomastia 04/09/2013   Hx of CABG 02/02/2013   Myelodysplastic syndrome 11/06/2012   Vitamin B12 deficiency 07/07/2012   Hyperlipemia 07/07/2012   Peripheral vascular disease ([place]) 04/14/2009   CARCINOMA, PROSTATE 04/13/2009   Non-thrombocytopenic purpura ([place]) 04/13/2009   Benign essential hypertension 04/13/2009   Chronic renal insufficiency, stage III (moderate) ([place]) 04/13/2009   SLEEP APNEA 04/13/2009    Jon Reid, PTA 10/19/2020, 11:13 AM  [facility] [address] [place], [place], [address] Phone: [phone]   Fax:  [phone]  Name: Jon Reid MRN: [id number] Date of Birth: 03/01/1929

## 2020-10-21 ENCOUNTER — Telehealth: Payer: Self-pay

## 2020-10-24 ENCOUNTER — Encounter: Payer: 59 | Admitting: Physical Therapy

## 2020-10-25 NOTE — Progress Notes (Signed)
Cardiology Office Note   Date:  10/26/2020   ID:  Jon Reid, DOB September 11, 1928, MRN [id number]  PCP:  Claretta Fraise, MD  Cardiologist:   Minus Breeding, MD  Referring:  Claretta Fraise, MD  Chief Complaint  Patient presents with   Coronary Artery Disease   Weakness       History of Present Illness: Jon Reid is a 87 y.o. male who presents for follow up of CAD.  He has CABG in 2014.  In 2017 he had a negative stress perfusion study.  Since I last saw him he was in the hospital with the patient was in the hospital in April with COVID-19 pneumonia and GI bleed.  Transthoracic echo was done at that time with EF of 50 to 55% with moderate AS.  He did have troponin elevation but was not thought to be a Candidate.  He was to be considered for perfusion study.  However, at the follow-up visit in May it was decided to manage this more conservatively as he was still recovering from his illness.  He returns for follow-up   Since I last saw him and since his hospitalizations he has become increasingly frail.  He complains about this.  He says he is having orthopedic problems and he walks hunched over".  He complains about losing some teeth he thinks with GI procedure.  He did have a couple of endoscopes but I do not see mention of trauma.  He is not having any new shortness of breath, PND or orthopnea but it looks like he moves much slower than he used to.  He had a fall and might of had a couple of these.  His memory of times and details is vague.  He has obvious recent trauma to his left elbow.  He thinks he fell.  He does not recall passing out.  He is not not reporting any new palpitations.   Past Medical History:  Diagnosis Date   Arthritis    Carcinoma of prostate ([place])    prostate   CKD (chronic kidney disease), stage III (HCC)    Coronary artery disease    a. 10/2012 Cath: LM 20-30d, lAD 80p, 65m 80-90d, LCX 90-938mOM1 90, RCA 50p, 2026m0d, RPDA 90, EF nl; b. 10/2012 CABG x 4  (LIMA-LAD, SVG-OM1, SVG-PDA->RPL); c. 07/2015 MV: Basal and proximal septal infarct w/o ischemia. EF 57%-->low risk.   First degree AV block 01/28/2019   Noted on EKG   GERD (gastroesophageal reflux disease)    Gout    HTN (hypertension)    Macrocytosis without anemia 01/08/2012   Moderate aortic stenosis    a. 10/2012 Echo: EF 55-60%, no rwma, BAE, mild AI, mild TR; b. 06/2020 Echo: EF 50-55%, no rwma, mild LVH. Mod dil LA. Mild MR. Mod AS.   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   Sleep apnea    Questionable   Spinal stenosis    [facility]   Past Surgical History:  Procedure Laterality Date   BALLOON DILATION N/A 10/03/2020   Procedure: BALLOON DILATION;  Surgeon: CarEloise HarmanO;  [facility];  Service: Endoscopy;  Laterality: N/A;   CARDIAC CATHETERIZATION  11/07/2012   Dr NahAcie FredricksonCATARACT EXTRACTION W/ INTRAOCULAR LENS IMPLANT Bilateral    CORONARY ARTERY BYPASS GRAFT N/A 11/11/2012   Procedure: CORONARY ARTERY BYPASS GRAFTING  times four using Right Greater Saphenous Vein Graft harvested endoscopically and Left Internal Mammary Artery.;  Surgeon: Ivin Poot, MD;  Location: [facility];  Service: Open Heart Surgery;  Laterality: N/A;   ESOPHAGEAL BRUSHING  10/03/2020   Procedure: ESOPHAGEAL BRUSHING;  Surgeon: [name], DO;  [facility];  Service: Endoscopy;;   ESOPHAGOGASTRODUODENOSCOPY (EGD) WITH PROPOFOL N/A 06/27/2020   Surgeon: [name], DO;  large hiatal hernia, LA grade D esophagitis with no bleeding, normal examined duodenum.    ESOPHAGOGASTRODUODENOSCOPY (EGD) WITH PROPOFOL N/A 10/03/2020   Procedure: ESOPHAGOGASTRODUODENOSCOPY (EGD) WITH PROPOFOL;  Surgeon: [name], DO;  [facility];  Service: Endoscopy;  Laterality: N/A;  2:00pm   INGUINAL HERNIA REPAIR Right 01/10/2017   Procedure: OPEN RIGHT HERNIA REPAIR INGUINAL;  Surgeon:  Ileana Roup, MD;  [facility];  Service: General;  Laterality: Right;   INSERTION OF MESH Right 01/10/2017   Procedure: INSERTION OF MESH;  Surgeon: Ileana Roup, MD;  [facility];  Service: General;  Laterality: Right;   INTRAOPERATIVE TRANSESOPHAGEAL ECHOCARDIOGRAM N/A 11/11/2012   Procedure: INTRAOPERATIVE TRANSESOPHAGEAL ECHOCARDIOGRAM;  Surgeon: Ivin Poot, MD;  Location: [facility];  Service: Open Heart Surgery;  Laterality: N/A;   LEFT HEART CATHETERIZATION WITH CORONARY ANGIOGRAM N/A 11/07/2012   Procedure: LEFT HEART CATHETERIZATION WITH CORONARY ANGIOGRAM;  Surgeon: Thayer Headings, MD;  Location: [facility] CATH LAB;  Service: Cardiovascular;  Laterality: N/A;   LUMBAR LAMINECTOMY/DECOMPRESSION MICRODISCECTOMY N/A 02/05/2019   Procedure: LUMBAR LAMINECTOMY/DECOMPRESSION L3-L4;  Surgeon: Latanya Maudlin, MD;  [facility];  Service: Orthopedics;  Laterality: N/A;  78mn   LYMPH NODE DISSECTION     Bilateral pelvic   RETROPUBIC PROSTATECTOMY     Radical     Current Outpatient Medications  Medication Sig Dispense Refill   ascorbic acid (VITAMIN C) 500 MG tablet Take 1 tablet (500 mg total) by mouth daily. 30 tablet 1   aspirin EC 81 MG tablet Take 81 mg by mouth daily.     carbidopa-levodopa (SINEMET IR) 25-100 MG tablet Take 1.5 tablets by mouth 3 (three) times daily. 405 tablet 3   Cholecalciferol (VITAMIN D) 2000 units CAPS Take 2,000 Units by mouth daily.     fluticasone (FLONASE) 50 MCG/ACT nasal spray One to 2 sprays each nostril at bedtime (Patient taking differently: Place 2 sprays into both nostrils at bedtime.) 16 g 6   furosemide (LASIX) 40 MG tablet TAKE 1 TABLET DAILY 90 tablet 0   Iron, Ferrous Sulfate, 325 (65 Fe) MG TABS Take 1 tablet by mouth daily. (Patient taking differently: Take 325 mg by mouth daily.) 30 tablet 3   nitroGLYCERIN (NITROSTAT) 0.4 MG SL tablet Place 1 tablet (0.4 mg total) under the tongue every 5 (five) minutes as needed for  chest pain. 25 tablet PRN   pantoprazole (PROTONIX) 40 MG tablet Take 1 tablet (40 mg total) by mouth 2 (two) times daily. TAKE 1 TABLET ONCE DAILY FOR REFLUX (Patient taking differently: Take 40 mg by mouth 2 (two) times daily.) 60 tablet 2   triamcinolone cream (KENALOG) 0.1 % Apply 1 application topically daily as needed (itching).     zinc sulfate 220 (50 Zn) MG capsule TAKE ONE CAPSULE ONCE DAILY (Patient taking differently: Take 220 mg by mouth daily.) 30 capsule 1   No current facility-administered medications for this visit.    Allergies:   Crestor [rosuvastatin]    ROS:  Please see the history of present illness.  Otherwise, review of systems are positive for failure to thrive..   All other systems are reviewed and negative.    PHYSICAL EXAM: VS:  BP 110/68   Pulse 80   Ht '5\' 10"'$  (1.778 m)   Wt 147 lb (66.7 kg)   BMI 21.09 kg/m  , BMI Body mass index is 21.09 kg/m. GENERAL: Frail appearing NECK:  No jugular venous distention, waveform within normal limits, carotid upstroke brisk and symmetric, no bruits, no thyromegaly LUNGS:  Clear to auscultation bilaterally CHEST:  Unremarkable HEART:  PMI not displaced or sustained,S1 and S2 within normal limits, no S3, no S4, no clicks, no rubs, 3 out of 6 apical systolic murmur nonradiating and brief, no diastolic murmurs ABD:  Flat, positive bowel sounds normal in frequency in pitch, no bruits, no rebound, no guarding, no midline pulsatile mass, no hepatomegaly, no splenomegaly EXT:  2 plus pulses upper, diminished dorsalis pedis and posterior tibialis bilateral, mild ankle edema, no cyanosis no clubbing    EKG:  EKG is  ordered today. Normal sinus rhythm, rate 78, right bundle branch block, left anterior fascicular block, first-degree AV block  Recent Labs: 06/27/2020: Magnesium 1.9 08/17/2020: BNP 156.9; TSH 1.330 09/23/2020: ALT 11; BUN 22; Creatinine, Ser 1.63; Hemoglobin 11.4; Platelets 272; Potassium 4.3; Sodium 139    Lipid  Panel    Component Value Date/Time   CHOL 159 05/27/2019 1004   CHOL 173 11/06/2012 1058   TRIG 64 05/27/2019 1004   TRIG 55 07/07/2015 0945   TRIG 73 11/06/2012 1058   HDL 57 05/27/2019 1004   HDL 54 07/07/2015 0945   HDL 66 11/06/2012 1058   CHOLHDL 2.8 05/27/2019 1004   CHOLHDL 2.5 11/08/2012 0500   VLDL 11 11/08/2012 0500   LDLCALC 89 05/27/2019 1004   LDLCALC 92 09/03/2013 0959   LDLCALC 92 11/06/2012 1058      Wt Readings from Last 3 Encounters:  10/26/20 147 lb (66.7 kg)  09/30/20 149 lb (67.6 kg)  09/23/20 149 lb (67.6 kg)      Other studies Reviewed: Additional studies/ records that were reviewed today include: Hospital records and labs Review of the above records demonstrates:  NA  Lab Results  Component Value Date   TSH 1.330 08/17/2020   Lab Results  Component Value Date   CREATININE 1.63 (H) 09/23/2020   ASSESSMENT AND PLAN:   Hx of CABG I do not think he is having any ongoing angina.  Despite his elevated enzymes in the hospital I am not planning an ischemia work-up given his multiple comorbidities and frailty's.   Benign esse after the monitor.  Ntial HTN Blood pressure is at target level.  No change in therapy.   Chronic renal insufficiency, stage III (moderate) Creatinine was stable in July at 1.63.   AS We will follow this clinically.  This was mild on echo earlier this year.    MR  Mild on echo .  No change in therapy.    Hyperlipemia LDL was 56 in March.  No change in therapy.   Bifasicular block He reports falls.  He is not reporting syncope.  However, I cannot be sure of this and he will need a 4-week event monitor.   Current medicines are reviewed at length with the patient today.  The patient does not have concerns regarding medicines.  The following changes have been made:  None  Labs/ tests ordered today include:   Orders Placed This Encounter  Procedures   CARDIAC EVENT  MONITOR   EKG 12-Lead      Disposition:    FU with me in after the monitor.     Signed, Minus Breeding, MD  10/26/2020 2:08 PM    [facility]

## 2020-10-26 ENCOUNTER — Encounter: Payer: Self-pay | Admitting: *Deleted

## 2020-10-26 ENCOUNTER — Other Ambulatory Visit: Payer: Self-pay

## 2020-10-26 ENCOUNTER — Encounter: Payer: Self-pay | Admitting: Cardiology

## 2020-10-26 ENCOUNTER — Ambulatory Visit (INDEPENDENT_AMBULATORY_CARE_PROVIDER_SITE_OTHER): Payer: MEDICARE | Admitting: Cardiology

## 2020-10-26 VITALS — BP 110/68 | HR 80 | Ht 70.0 in | Wt 147.0 lb

## 2020-10-26 DIAGNOSIS — I493 Ventricular premature depolarization: Secondary | ICD-10-CM

## 2020-10-26 DIAGNOSIS — I208 Other forms of angina pectoris: Secondary | ICD-10-CM | POA: Diagnosis not present

## 2020-10-26 DIAGNOSIS — I350 Nonrheumatic aortic (valve) stenosis: Secondary | ICD-10-CM | POA: Diagnosis not present

## 2020-10-26 DIAGNOSIS — R296 Repeated falls: Secondary | ICD-10-CM

## 2020-10-26 DIAGNOSIS — I25810 Atherosclerosis of coronary artery bypass graft(s) without angina pectoris: Secondary | ICD-10-CM

## 2020-10-26 DIAGNOSIS — I2510 Atherosclerotic heart disease of native coronary artery without angina pectoris: Secondary | ICD-10-CM | POA: Diagnosis not present

## 2020-10-26 DIAGNOSIS — I452 Bifascicular block: Secondary | ICD-10-CM

## 2020-10-26 DIAGNOSIS — E785 Hyperlipidemia, unspecified: Secondary | ICD-10-CM | POA: Diagnosis not present

## 2020-10-26 NOTE — Progress Notes (Signed)
Patient ID: Jon Reid, male   DOB: 05-12-1928, 87 y.o.   MRN: [id number] Patient enrolled for [employer] to ship a 30 day cardiac event monitor to his address on file. Letter with instructions mailed to patient.

## 2020-10-26 NOTE — Patient Instructions (Signed)
Medication Instructions:  The current medical regimen is effective;  continue present plan and medications.  *If you need a refill on your cardiac medications before your next appointment, please call your pharmacy*  Testing/Procedures: Your physician has recommended that you wear an event monitor. Event monitors are medical devices that record the heart's electrical activity. Doctors most often [name] these monitors to diagnose arrhythmias. Arrhythmias are problems with the speed or rhythm of the heartbeat. The monitor is a small, portable device. You can wear one while you do your normal daily activities. This is usually used to diagnose what is causing palpitations/syncope (passing out).  Follow-Up: At [facility], you and your health needs are our priority.  As part of our continuing mission to provide you with exceptional heart care, we have created designated Provider Care Teams.  These Care Teams include your primary Cardiologist (physician) and Advanced Practice Providers (APPs -  Physician Assistants and Nurse Practitioners) who all work together to provide you with the care you need, when you need it.  We recommend signing up for the patient portal called "MyChart".  Sign up information is provided on this After Visit Summary.  MyChart is used to connect with patients for Virtual Visits (Telemedicine).  Patients are able to view lab/test results, encounter notes, upcoming appointments, etc.  Non-urgent messages can be sent to your provider as well.   To learn more about what you can do with MyChart, go to [email or web address].    Your next appointment:   Follow up with Dr Percival Spanish after your heart monitor.  Thank you for choosing [facility]!!

## 2020-10-27 ENCOUNTER — Ambulatory Visit: Payer: MEDICARE | Admitting: Physical Therapy

## 2020-10-27 ENCOUNTER — Other Ambulatory Visit: Payer: Self-pay

## 2020-10-27 DIAGNOSIS — R2681 Unsteadiness on feet: Secondary | ICD-10-CM

## 2020-10-27 DIAGNOSIS — R262 Difficulty in walking, not elsewhere classified: Secondary | ICD-10-CM | POA: Diagnosis not present

## 2020-10-27 DIAGNOSIS — M6281 Muscle weakness (generalized): Secondary | ICD-10-CM

## 2020-10-27 DIAGNOSIS — R296 Repeated falls: Secondary | ICD-10-CM

## 2020-10-27 NOTE — Therapy (Signed)
[facility] [place], [place], [address] Phone: [phone]   Fax:  [phone]  Physical Therapy Treatment  Patient Details  Name: Jon Reid MRN: [id number] Date of Birth: [birth year] Referring Provider (PT): Margette Fast   Encounter Date: 10/27/2020   PT End of Session - 10/27/20 1036     Visit Number 9    Number of Visits 12    Date for PT Re-Evaluation 11/09/20    PT Start Time 1030    PT Stop Time 1113    PT Time Calculation (min) 43 min    Activity Tolerance Patient tolerated treatment well    Behavior During Therapy [facility] for tasks assessed/performed             Past Medical History:  Diagnosis Date   Arthritis    Carcinoma of prostate ([place])    prostate   CKD (chronic kidney disease), stage III ([place])    Coronary artery disease    a. 10/2012 Cath: LM 20-30d, lAD 80p, 12m 80-90d, LCX 90-931mOM1 90, RCA 50p, 2058m0d, RPDA 90, EF nl; b. 10/2012 CABG x 4 (LIMA-LAD, SVG-OM1, SVG-PDA->RPL); c. 07/2015 MV: Basal and proximal septal infarct w/o ischemia. EF 57%-->low risk.   First degree AV block 01/28/2019   Noted on EKG   GERD (gastroesophageal reflux disease)    Gout    HTN (hypertension)    Macrocytosis without anemia 01/08/2012   Moderate aortic stenosis    a. 10/2012 Echo: EF 55-60%, no rwma, BAE, mild AI, mild TR; b. 06/2020 Echo: EF 50-55%, no rwma, mild LVH. Mod dil LA. Mild MR. Mod AS.   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   Sleep apnea    Questionable   Spinal stenosis    [facility]   Past Surgical History:  Procedure Laterality Date   BALLOON DILATION N/A 10/03/2020   Procedure: BALLOON DILATION;  Surgeon: CarEloise HarmanO;  [facility];  Service: Endoscopy;  Laterality: N/A;   CARDIAC CATHETERIZATION  11/07/2012   Dr NahAcie FredricksonCATARACT EXTRACTION W/ INTRAOCULAR LENS IMPLANT Bilateral     CORONARY ARTERY BYPASS GRAFT N/A 11/11/2012   Procedure: CORONARY ARTERY BYPASS GRAFTING times four using Right Greater Saphenous Vein Graft harvested endoscopically and Left Internal Mammary Artery.;  Surgeon: PetIvin PootD;  [facility];  Laterality: N/A;   ESOPHAGEAL BRUSHING  10/03/2020   Procedure: ESOPHAGEAL BRUSHING;  Surgeon: CarEloise HarmanO;  [facility];  Service: Endoscopy;;   ESOPHAGOGASTRODUODENOSCOPY (EGD) WITH PROPOFOL N/A 06/27/2020   Surgeon: CarEloise HarmanO;  large hiatal hernia, LA grade D esophagitis with no bleeding, normal examined duodenum.    ESOPHAGOGASTRODUODENOSCOPY (EGD) WITH PROPOFOL N/A 10/03/2020   Procedure: ESOPHAGOGASTRODUODENOSCOPY (EGD) WITH PROPOFOL;  Surgeon: CarEloise HarmanO;  [facility];  Service: Endoscopy;  Laterality: N/A;  2:00pm   INGUINAL HERNIA REPAIR Right 01/10/2017   Procedure: OPEN RIGHT HERNIA REPAIR INGUINAL;  Surgeon: WhiIleana RoupD;  [facility];  Service: General;  Laterality: Right;   INSERTION OF MESH Right 01/10/2017   Procedure: INSERTION OF MESH;  Surgeon: WhiIleana RoupD;  [facility];  Service: General;  Laterality: Right;   INTRAOPERATIVE TRANSESOPHAGEAL ECHOCARDIOGRAM N/A 11/11/2012   Procedure: INTRAOPERATIVE TRANSESOPHAGEAL ECHOCARDIOGRAM;  Surgeon: PetIvin PootD;  [facility];  Service: Open Heart Surgery;  Laterality: N/A;   LEFT HEART CATHETERIZATION WITH CORONARY ANGIOGRAM N/A 11/07/2012   Procedure: LEFT HEART CATHETERIZATION WITH CORONARY ANGIOGRAM;  Surgeon: Thayer Headings, MD;  Location: [facility] CATH LAB;  Service: Cardiovascular;  Laterality: N/A;   LUMBAR LAMINECTOMY/DECOMPRESSION MICRODISCECTOMY N/A 02/05/2019   Procedure: LUMBAR LAMINECTOMY/DECOMPRESSION L3-L4;  Surgeon: Latanya Maudlin, MD;  [facility];  Service: Orthopedics;  Laterality: N/A;  4mn   LYMPH NODE DISSECTION     Bilateral pelvic    RETROPUBIC PROSTATECTOMY     Radical    There were no vitals filed for this visit.   Subjective Assessment - 10/27/20 1031     Subjective Covid-19 screen performed upon entering clinic. Patient arrived and reported a fall when he was getting into car the other day. Fell onto left arm with a few cuts and bruises. Advised patient on assistive device to avoid future injury/falls.    Pertinent History CKD, CAD, HTN, history of stroke, CABG x4, lumbar laminectomy/decompression microdiscectomy 02/05/2019    Limitations Sitting;Standing;Walking    Currently in Pain? No/denies                               [facility] Adult PT Treatment/Exercise - 10/27/20 0001       Ambulation/Gait   Ambulation/Gait Yes    Ambulation/Gait Assistance 5: Supervision    Ambulation Distance (Feet) 20 Feet    Assistive device Standard walker    Gait Pattern Step-through pattern;Decreased arm swing - right;Decreased step length - right;Decreased stance time - right;Decreased dorsiflexion - right;Decreased dorsiflexion - left;Shuffle;Trunk flexed;Narrow base of support    Gait Comments eduaction required for technique      Knee/Hip Exercises: Aerobic   Nustep L3, seat 11 x18 min      Knee/Hip Exercises: Machines for Strengthening   Cybex Knee Extension 10# x 364m    Cybex Knee Flexion 30# x3m59m                Balance Exercises - 10/27/20 0001       Balance Exercises: Standing   Standing Eyes Opened Narrow base of support (BOS);Solid surface   x2mi39m Partial Tandem Stance Eyes open;Upper extremity support 1;4 reps    Marching Solid surface;Upper extremity assist 1;Forwards   2x10   Heel Raises 20 reps    Sit to Stand Standard surface   x15   Other Standing Exercises standing with SBOS, and staggered stance holding cane bil UE w trunck rotations x20 each way, overhead x20               PT Education - 10/27/20 1119     Education Details fall prevention and use of assit  device    Person(s) Educated Patient    Methods Explanation;Demonstration    Comprehension Verbalized understanding;Returned demonstration                 PT Long Term Goals - 10/27/20 1037       PT LONG TERM GOAL #1   Title Patient will be independent with HEP.    Time 4    Period Weeks    Status On-going      PT LONG TERM GOAL #2   Title Patient will perform 5x sit to stand test in 15 seconds or less with no UE support to improve balance and improve functional LE strength.    Baseline 5x in  17 seconds 10/27/20    Time 4    Period Weeks    Status On-going      PT LONG TERM GOAL #3   Title Patient will decrease risk of falls as noted by a 4+ improvement on Berg Balance Scale.    Baseline 43/56 today increased by 1 point 10/17/20    Time 4    Period Weeks    Status On-going      PT LONG TERM GOAL #4   Title Patient will demonstrate  4+/5 or greater bilateral LE MMT to improve stability during functional tasks.    Time 4    Period Weeks    Status On-going                   Plan - 10/27/20 1106     Clinical Impression Statement Patient tolerated treatment well today. Patient reported he had a fall with minor injury. Today educated patient on using assist device and discussed him bring in his own walker for gait training. Patient has improved pace with sit to stands today. Patient able to progress with balance and pre gait activities. Goals progressing this week.    Personal Factors and Comorbidities Comorbidity 3+;Age    Comorbidities PD, DM, fall risk, CHF    Examination-Activity Limitations Bed Mobility;Bend;Lift;Carry;Locomotion Level    Stability/Clinical Decision Making Stable/Uncomplicated    Rehab Potential Good    PT Frequency 2x / week    PT Duration 6 weeks    PT Treatment/Interventions ADLs/Self Care Home Management;Moist Heat;DME Instruction;Gait training;Therapeutic exercise;Therapeutic activities;Neuromuscular re-education;Patient/family  education;Manual techniques;Energy conservation    PT Next Visit Plan cont with POC for balance and strength/sit to stand following thoracic mobility activty - modified reclined over physioball    Consulted and Agree with Plan of Care Patient             Patient will benefit from skilled therapeutic intervention in order to improve the following deficits and impairments:  Abnormal gait, Decreased balance, Decreased endurance, Decreased mobility, Impaired vision/preception, Increased edema, Decreased range of motion, Decreased activity tolerance, Impaired flexibility, Postural dysfunction, Pain, Decreased strength  Visit Diagnosis: Difficulty in walking, not elsewhere classified  Repeated falls  Unsteadiness on feet  Muscle weakness (generalized)     Problem List Patient Active Problem List   Diagnosis Date Noted   Dizziness 09/23/2020   History of GI bleed 08/19/2020   Loss of weight 08/19/2020   Dysphagia 08/19/2020   Upper GI bleed 06/25/2020   COVID-19 virus infection 06/25/2020   [name] affect 02/09/2020   Iron deficiency anemia due to chronic blood loss 05/27/2019   Nonrheumatic mitral valve regurgitation 05/26/2019   Left anterior fascicular block 05/26/2019   Educated about COVID-19 virus infection 04/06/2019   Constipation 02/19/2019   Elevated troponin 02/09/2019   Fecal occult blood test positive 02/09/2019   Spinal stenosis, lumbar region with neurogenic claudication 02/05/2019   Leg swelling 01/26/2019   Nonrheumatic aortic valve stenosis 01/26/2019   Multiple joint pain 12/11/2018   Chronic pain of both knees 11/07/2018   Vitamin D deficiency 10/23/2018   Gastroesophageal reflux disease with esophagitis 03/13/2018   Murmur, cardiac 12/04/2017   Coronary artery disease involving native coronary artery of native heart without angina pectoris 12/04/2017   PVC's (premature ventricular contractions) 12/04/2017   Heart disease 08/21/2017   Weakness  11/14/2016   Abnormality of gait 12/06/2015   Abdominal aortic atherosclerosis ([place]) 12/01/2015   Degeneration of lumbar intervertebral disc 12/01/2015   Exertional  angina ([place]) 07/20/2015   Hernia of abdominal wall 10/12/2014   Gynecomastia 04/09/2013   Hx of CABG 02/02/2013   Myelodysplastic syndrome 11/06/2012   Vitamin B12 deficiency 07/07/2012   Hyperlipemia 07/07/2012   Peripheral vascular disease ([place]) 04/14/2009   CARCINOMA, PROSTATE 04/13/2009   Non-thrombocytopenic purpura ([place]) 04/13/2009   Benign essential hypertension 04/13/2009   Chronic renal insufficiency, stage III (moderate) ([place]) 04/13/2009   SLEEP APNEA 04/13/2009    Brinn Westby P, PTA 10/27/2020, 11:20 AM  [facility] [place], [place], [address] Phone: [phone]   Fax:  [phone]  Name: TRACIE SUCHECKI MRN: [id number] Date of Birth: 11/08/1928

## 2020-10-31 ENCOUNTER — Ambulatory Visit (INDEPENDENT_AMBULATORY_CARE_PROVIDER_SITE_OTHER): Payer: MEDICARE

## 2020-10-31 ENCOUNTER — Encounter: Payer: Self-pay | Admitting: Family Medicine

## 2020-10-31 ENCOUNTER — Ambulatory Visit: Payer: MEDICARE | Admitting: Physical Therapy

## 2020-10-31 ENCOUNTER — Other Ambulatory Visit: Payer: Self-pay

## 2020-10-31 ENCOUNTER — Ambulatory Visit (INDEPENDENT_AMBULATORY_CARE_PROVIDER_SITE_OTHER): Payer: MEDICARE | Admitting: Family Medicine

## 2020-10-31 VITALS — BP 133/57 | HR 75 | Temp 97.5°F | Ht 70.0 in | Wt 145.2 lb

## 2020-10-31 DIAGNOSIS — S60932A Unspecified superficial injury of left thumb, initial encounter: Secondary | ICD-10-CM | POA: Diagnosis not present

## 2020-10-31 DIAGNOSIS — K921 Melena: Secondary | ICD-10-CM

## 2020-10-31 DIAGNOSIS — R296 Repeated falls: Secondary | ICD-10-CM

## 2020-10-31 DIAGNOSIS — W19XXXA Unspecified fall, initial encounter: Secondary | ICD-10-CM | POA: Diagnosis not present

## 2020-10-31 DIAGNOSIS — Z8719 Personal history of other diseases of the digestive system: Secondary | ICD-10-CM | POA: Diagnosis not present

## 2020-10-31 DIAGNOSIS — R262 Difficulty in walking, not elsewhere classified: Secondary | ICD-10-CM

## 2020-10-31 DIAGNOSIS — M6281 Muscle weakness (generalized): Secondary | ICD-10-CM

## 2020-10-31 DIAGNOSIS — M7989 Other specified soft tissue disorders: Secondary | ICD-10-CM | POA: Diagnosis not present

## 2020-10-31 DIAGNOSIS — M79645 Pain in left finger(s): Secondary | ICD-10-CM | POA: Diagnosis not present

## 2020-10-31 DIAGNOSIS — R2681 Unsteadiness on feet: Secondary | ICD-10-CM

## 2020-10-31 LAB — HEMOGLOBIN, FINGERSTICK: Hemoglobin: 10.5 g/dL — ABNORMAL LOW (ref 12.6–17.7)

## 2020-10-31 MED ORDER — CEPHALEXIN 500 MG PO CAPS: 500.0000 mg | ORAL_CAPSULE | Freq: Two times a day (BID) | ORAL | 0 refills | Status: DC

## 2020-10-31 NOTE — Therapy (Signed)
[facility] [place], [place], [address] Phone: [phone]   Fax:  [phone]  Progress Note Reporting Period 7/13 to 8/15  See note below for Objective Data and Assessment of Progress/Goals.     Patient Details  Name: Jon Reid MRN: [id number] Date of Birth: 1928/05/07 Referring Provider (PT): Margette Fast   Encounter Date: 10/31/2020   PT End of Session - 10/31/20 1047     Visit Number 10    Number of Visits 12    Date for PT Re-Evaluation 11/09/20    PT Start Time 1030    PT Stop Time 1110    PT Time Calculation (min) 40 min    Activity Tolerance Patient tolerated treatment well    Behavior During Therapy [facility] for tasks assessed/performed             Past Medical History:  Diagnosis Date   Arthritis    Carcinoma of prostate ([place])    prostate   CKD (chronic kidney disease), stage III ([place])    Coronary artery disease    a. 10/2012 Cath: LM 20-30d, lAD 80p, 57m 80-90d, LCX 90-99mOM1 90, RCA 50p, 2041m0d, RPDA 90, EF nl; b. 10/2012 CABG x 4 (LIMA-LAD, SVG-OM1, SVG-PDA->RPL); c. 07/2015 MV: Basal and proximal septal infarct w/o ischemia. EF 57%-->low risk.   First degree AV block 01/28/2019   Noted on EKG   GERD (gastroesophageal reflux disease)    Gout    HTN (hypertension)    Macrocytosis without anemia 01/08/2012   Moderate aortic stenosis    a. 10/2012 Echo: EF 55-60%, no rwma, BAE, mild AI, mild TR; b. 06/2020 Echo: EF 50-55%, no rwma, mild LVH. Mod dil LA. Mild MR. Mod AS.   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   Sleep apnea    Questionable   Spinal stenosis    [facility]   Past Surgical History:  Procedure Laterality Date   BALLOON DILATION N/A 10/03/2020   Procedure: BALLOON DILATION;  Surgeon: CarEloise HarmanO;  [facility];  Service: Endoscopy;  Laterality: N/A;   CARDIAC  CATHETERIZATION  11/07/2012   Dr NahAcie FredricksonCATARACT EXTRACTION W/ INTRAOCULAR LENS IMPLANT Bilateral    CORONARY ARTERY BYPASS GRAFT N/A 11/11/2012   Procedure: CORONARY ARTERY BYPASS GRAFTING times four using Right Greater Saphenous Vein Graft harvested endoscopically and Left Internal Mammary Artery.;  Surgeon: PetIvin PootD;  [facility];  Laterality: N/A;   ESOPHAGEAL BRUSHING  10/03/2020   Procedure: ESOPHAGEAL BRUSHING;  Surgeon: CarEloise HarmanO;  [facility];  Service: Endoscopy;;   ESOPHAGOGASTRODUODENOSCOPY (EGD) WITH PROPOFOL N/A 06/27/2020   Surgeon: CarEloise HarmanO;  large hiatal hernia, LA grade D esophagitis with no bleeding, normal examined duodenum.    ESOPHAGOGASTRODUODENOSCOPY (EGD) WITH PROPOFOL N/A 10/03/2020   Procedure: ESOPHAGOGASTRODUODENOSCOPY (EGD) WITH PROPOFOL;  Surgeon: CarEloise HarmanO;  [facility];  Service: Endoscopy;  Laterality: N/A;  2:00pm   INGUINAL HERNIA REPAIR Right 01/10/2017   Procedure: OPEN RIGHT HERNIA REPAIR INGUINAL;  Surgeon: WhiIleana RoupD;  [facility];  Service: General;  Laterality: Right;   INSERTION OF MESH Right 01/10/2017   Procedure: INSERTION OF MESH;  Surgeon: WhiIleana RoupD;  [facility];  Service: General;  Laterality: Right;  INTRAOPERATIVE TRANSESOPHAGEAL ECHOCARDIOGRAM N/A 11/11/2012   Procedure: INTRAOPERATIVE TRANSESOPHAGEAL ECHOCARDIOGRAM;  Surgeon: Ivin Poot, MD;  Location: [facility];  Service: Open Heart Surgery;  Laterality: N/A;   LEFT HEART CATHETERIZATION WITH CORONARY ANGIOGRAM N/A 11/07/2012   Procedure: LEFT HEART CATHETERIZATION WITH CORONARY ANGIOGRAM;  Surgeon: Thayer Headings, MD;  Location: [facility] CATH LAB;  Service: Cardiovascular;  Laterality: N/A;   LUMBAR LAMINECTOMY/DECOMPRESSION MICRODISCECTOMY N/A 02/05/2019   Procedure: LUMBAR LAMINECTOMY/DECOMPRESSION L3-L4;  Surgeon: Latanya Maudlin, MD;  [facility];  Service: Orthopedics;  Laterality: N/A;  68mn   LYMPH NODE DISSECTION     Bilateral pelvic   RETROPUBIC PROSTATECTOMY     Radical    There were no vitals filed for this visit.   Subjective Assessment - 10/31/20 1034     Subjective Covid-19 screen performed upon entering clinic. Patient arrived and reported another fall on friday on the asphalt and has a few cuts with a swollen thumb and redness, advised patient to contact MD today for appt to check thumb. No assist device today.    Pertinent History CKD, CAD, HTN, history of stroke, CABG x4, lumbar laminectomy/decompression microdiscectomy 02/05/2019    Limitations Sitting;Standing;Walking    Currently in Pain? No/denies                               [facility]20 0001       Berg Balance Test   Sit to Stand Able to stand without using hands and stabilize independently    Standing Unsupported Able to stand safely 2 minutes    Sitting with Back Unsupported but Feet Supported on Floor or Stool Able to sit safely and securely 2 minutes    Stand to Sit Sits safely with minimal use of hands    Transfers Able to transfer safely, minor use of hands    Standing Unsupported with Eyes Closed Able to stand 10 seconds safely    Standing Ubsupported with Feet Together Able to place feet together independently and stand 1 minute safely    From Standing, Reach Forward with Outstretched Arm Can reach forward >12 cm safely (5")    From Standing Position, Pick up Object from Floor Able to pick up shoe safely and easily    From Standing Position, Turn to Look Behind Over each Shoulder Turn sideways only but maintains balance    Turn 360 Degrees Able to turn 360 degrees safely one side only in 4 seconds or less    Standing Unsupported, Alternately Place Feet on Step/Stool Able to complete 4 steps without aid or supervision    Standing Unsupported, One Foot in Front Needs help to step but can hold  15 seconds    Standing on One Leg Unable to try or needs assist to prevent fall    Total Score 43      Knee/Hip Exercises: Aerobic   Nustep L3, seat 11 x18 min      Knee/Hip Exercises: Standing   Heel Raises Both;20 reps      Knee/Hip Exercises: Seated   Long Arc Quad Strengthening;Both;3 sets;10 reps    Long Arc Quad Weight 4 lbs.    Clamshell with TheraBand Red   x30   Knee/Hip Flexion seated for diagnols wqith ball and trunk rotation 2x10    Other Seated Knee/Hip Exercises core sets with red ball x 30    Marching Strengthening;Both;20 reps;10 reps    Marching  Limitations red band    Hamstring Curl Strengthening;Both;20 reps    Hamstring Limitations red band    Sit to Sand 15 reps;without UE support                    PT Education - 10/31/20 1054     Education Details HEP    Person(s) Educated Patient    Methods Explanation;Demonstration;Handout    Comprehension Verbalized understanding;Returned demonstration                 PT Long Term Goals - 10/31/20 1047       PT LONG TERM GOAL #1   Title Patient will be independent with HEP.    Baseline Met 10/31/20    Time 4    Period Weeks    Status Achieved      PT LONG TERM GOAL #2   Title Patient will perform 5x sit to stand test in 15 seconds or less with no UE support to improve balance and improve functional LE strength.    Baseline 5x in 17 seconds 10/31/20    Time 4    Period Weeks    Status On-going      PT LONG TERM GOAL #3   Title Patient will decrease risk of falls as noted by a 4+ improvement on Berg Balance Scale.    Baseline 43/56  10/31/20    Time 4    Period Weeks    Status On-going      PT LONG TERM GOAL #4   Title Patient will demonstrate  4+/5 or greater bilateral LE MMT to improve stability during functional tasks.    Time 4    Period Weeks    Status On-going                   Plan - 10/31/20 1102     Clinical Impression Statement Patient tolerated treatment fair  due to fall on friday. Today focused on fall prevension and continued to educate patient and patients son on imprortance of assist device. PT discussed safety with patient also and going to MD for assessment of injury. Today focused on seated and low level exercises, and issued HEP for home progression. Patient met HEP goal with remaing progressing.    Personal Factors and Comorbidities Comorbidity 3+;Age    Comorbidities PD, DM, fall risk, CHF    Examination-Activity Limitations Bed Mobility;Bend;Lift;Carry;Locomotion Level    Stability/Clinical Decision Making Stable/Uncomplicated    Rehab Potential Good    PT Frequency 2x / week    PT Duration 6 weeks    PT Treatment/Interventions ADLs/Self Care Home Management;Moist Heat;DME Instruction;Gait training;Therapeutic exercise;Therapeutic activities;Neuromuscular re-education;Patient/family education;Manual techniques;Energy conservation    PT Next Visit Plan cont with POC for balance and strength/sit to stand following thoracic mobility activty - modified reclined over physioball    Consulted and Agree with Plan of Care Patient             Patient will benefit from skilled therapeutic intervention in order to improve the following deficits and impairments:  Abnormal gait, Decreased balance, Decreased endurance, Decreased mobility, Impaired vision/preception, Increased edema, Decreased range of motion, Decreased activity tolerance, Impaired flexibility, Postural dysfunction, Pain, Decreased strength  Visit Diagnosis: Difficulty in walking, not elsewhere classified  Repeated falls  Unsteadiness on feet  Muscle weakness (generalized)     Problem List Patient Active Problem List   Diagnosis Date Noted   Dizziness 09/23/2020   History of GI bleed 08/19/2020  Loss of weight 08/19/2020   Dysphagia 08/19/2020   Upper GI bleed 06/25/2020   COVID-19 virus infection 06/25/2020   [name] affect 02/09/2020   Iron deficiency anemia  due to chronic blood loss 05/27/2019   Nonrheumatic mitral valve regurgitation 05/26/2019   Left anterior fascicular block 05/26/2019   Educated about COVID-19 virus infection 04/06/2019   Constipation 02/19/2019   Elevated troponin 02/09/2019   Fecal occult blood test positive 02/09/2019   Spinal stenosis, lumbar region with neurogenic claudication 02/05/2019   Leg swelling 01/26/2019   Nonrheumatic aortic valve stenosis 01/26/2019   Multiple joint pain 12/11/2018   Chronic pain of both knees 11/07/2018   Vitamin D deficiency 10/23/2018   Gastroesophageal reflux disease with esophagitis 03/13/2018   Murmur, cardiac 12/04/2017   Coronary artery disease involving native coronary artery of native heart without angina pectoris 12/04/2017   PVC's (premature ventricular contractions) 12/04/2017   Heart disease 08/21/2017   Weakness 11/14/2016   Abnormality of gait 12/06/2015   Abdominal aortic atherosclerosis ([place]) 12/01/2015   Degeneration of lumbar intervertebral disc 12/01/2015   Exertional angina ([place]) 07/20/2015   Hernia of abdominal wall 10/12/2014   Gynecomastia 04/09/2013   Hx of CABG 02/02/2013   Myelodysplastic syndrome 11/06/2012   Vitamin B12 deficiency 07/07/2012   Hyperlipemia 07/07/2012   Peripheral vascular disease ([place]) 04/14/2009   CARCINOMA, PROSTATE 04/13/2009   Non-thrombocytopenic purpura ([place]) 04/13/2009   Benign essential hypertension 04/13/2009   Chronic renal insufficiency, stage III (moderate) ([place]) 04/13/2009   SLEEP APNEA 04/13/2009    Ladean Raya, PTA 10/31/20 11:12 AM   Therapy screening examination/treatment/procedure(s) were performed by therapist assistant and as primary therapist I was immediately available for consultation/collaboration.  Billy Coast PT, DPT    [facility] [place], [place], [address] Phone: [phone]   Fax:  [phone]  Name: Jon Reid MRN:  [id number] Date of Birth: 1928/10/30

## 2020-10-31 NOTE — Progress Notes (Signed)
Acute Office Visit  Subjective:    Patient ID: Jon Reid, male    DOB: 1929/03/16, 87 y.o.   MRN: [id number]  Chief Complaint  Patient presents with   Fall    HPI Patient is in today for left thumb pain after a fall 4 days ago. Since he fell his left thumb has been swollen and red. It has become painful to the touch. He is able to move his thumb. He denies fever.   He also report black tarry stools x 2 days. He has a history of a GI bleed and has had to have blood transfusions in the recent past. He had an EGD done about 1 month ago. He does feel more tired than usual. He denies dizziness. He is established with [facility].   Past Medical History:  Diagnosis Date   Arthritis    Carcinoma of prostate ([place])    prostate   CKD (chronic kidney disease), stage III ([facility])    Coronary artery disease    a. 10/2012 Cath: LM 20-30d, lAD 80p, 55m 80-90d, LCX 90-960mOM1 90, RCA 50p, 2055m0d, RPDA 90, EF nl; b. 10/2012 CABG x 4 (LIMA-LAD, SVG-OM1, SVG-PDA->RPL); c. 07/2015 MV: Basal and proximal septal infarct w/o ischemia. EF 57%-->low risk.   First degree AV block 01/28/2019   Noted on EKG   GERD (gastroesophageal reflux disease)    Gout    HTN (hypertension)    Macrocytosis without anemia 01/08/2012   Moderate aortic stenosis    a. 10/2012 Echo: EF 55-60%, no rwma, BAE, mild AI, mild TR; b. 06/2020 Echo: EF 50-55%, no rwma, mild LVH. Mod dil LA. Mild MR. Mod AS.   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   Sleep apnea    Questionable   Spinal stenosis    [facility]   Past Surgical History:  Procedure Laterality Date   BALLOON DILATION N/A 10/03/2020   Procedure: BALLOON DILATION;  Surgeon: CarEloise HarmanO;  [facility];  Service: Endoscopy;  Laterality: N/A;   CARDIAC CATHETERIZATION  11/07/2012   Dr NahAcie FredricksonCATARACT EXTRACTION W/ INTRAOCULAR LENS IMPLANT Bilateral    CORONARY  ARTERY BYPASS GRAFT N/A 11/11/2012   Procedure: CORONARY ARTERY BYPASS GRAFTING times four using Right Greater Saphenous Vein Graft harvested endoscopically and Left Internal Mammary Artery.;  Surgeon: PetIvin PootD;  [facility];  Laterality: N/A;   ESOPHAGEAL BRUSHING  10/03/2020   Procedure: ESOPHAGEAL BRUSHING;  Surgeon: CarEloise HarmanO;  [facility];  Service: Endoscopy;;   ESOPHAGOGASTRODUODENOSCOPY (EGD) WITH PROPOFOL N/A 06/27/2020   Surgeon: CarEloise HarmanO;  large hiatal hernia, LA grade D esophagitis with no bleeding, normal examined duodenum.    ESOPHAGOGASTRODUODENOSCOPY (EGD) WITH PROPOFOL N/A 10/03/2020   Procedure: ESOPHAGOGASTRODUODENOSCOPY (EGD) WITH PROPOFOL;  Surgeon: CarEloise HarmanO;  [facility];  Service: Endoscopy;  Laterality: N/A;  2:00pm   INGUINAL HERNIA REPAIR Right 01/10/2017   Procedure: OPEN RIGHT HERNIA REPAIR INGUINAL;  Surgeon: WhiIleana RoupD;  [facility];  Service: General;  Laterality: Right;   INSERTION OF MESH Right 01/10/2017   Procedure: INSERTION OF MESH;  Surgeon: WhiIleana RoupD;  [facility];  Service: General;  Laterality: Right;   INTRAOPERATIVE TRANSESOPHAGEAL ECHOCARDIOGRAM N/A 11/11/2012   Procedure: INTRAOPERATIVE TRANSESOPHAGEAL  ECHOCARDIOGRAM;  Surgeon: Ivin Poot, MD;  Location: [place];  Service: Open Heart Surgery;  Laterality: N/A;   LEFT HEART CATHETERIZATION WITH CORONARY ANGIOGRAM N/A 11/07/2012   Procedure: LEFT HEART CATHETERIZATION WITH CORONARY ANGIOGRAM;  Surgeon: Thayer Headings, MD;  Location: [facility] CATH LAB;  Service: Cardiovascular;  Laterality: N/A;   LUMBAR LAMINECTOMY/DECOMPRESSION MICRODISCECTOMY N/A 02/05/2019   Procedure: LUMBAR LAMINECTOMY/DECOMPRESSION L3-L4;  Surgeon: Latanya Maudlin, MD;  [facility];  Service: Orthopedics;  Laterality: N/A;  75mn   LYMPH NODE DISSECTION     Bilateral pelvic   RETROPUBIC  PROSTATECTOMY     Radical    Family History  Problem Relation Age of Onset   Aneurysm Father        Cerebral   Stroke Father    Hypertension Father    Cancer Mother        colon   Parkinson's disease Son     Social History   Socioeconomic History   Marital status: Married    Spouse name: [name]  Number of children: 3   Years of education: 12   Highest education level: High school graduate  Occupational History   Occupation: Retired  Tobacco Use   Smoking status: Never   Smokeless tobacco: Never  VScientific laboratory technicianUse: Never used  Substance and Sexual Activity   Alcohol use: Yes    Comment: Rare   Drug use: No   Sexual activity: Not Currently  Other Topics Concern   Not on file  Social History Narrative   Lives at home with wife/sons will stay with them occassionally   Married with 3 children   Denies caffeine use    Social Determinants of HRadio broadcast assistantStrain: Not on file  Food Insecurity: Not on file  Transportation Needs: Not on file  Physical Activity: Not on file  Stress: Not on file  Social Connections: Not on file  Intimate Partner Violence: Not on file    Outpatient Medications Prior to Visit  Medication Sig Dispense Refill   ascorbic acid (VITAMIN C) 500 MG tablet Take 1 tablet (500 mg total) by mouth daily. 30 tablet 1   aspirin EC 81 MG tablet Take 81 mg by mouth daily.     carbidopa-levodopa (SINEMET IR) 25-100 MG tablet Take 1.5 tablets by mouth 3 (three) times daily. 405 tablet 3   Cholecalciferol (VITAMIN D) 2000 units CAPS Take 2,000 Units by mouth daily.     fluticasone (FLONASE) 50 MCG/ACT nasal spray One to 2 sprays each nostril at bedtime (Patient taking differently: Place 2 sprays into both nostrils at bedtime.) 16 g 6   furosemide (LASIX) 40 MG tablet TAKE 1 TABLET DAILY 90 tablet 0   Iron, Ferrous Sulfate, 325 (65 Fe) MG TABS Take 1 tablet by mouth daily. (Patient taking differently: Take 325 mg by mouth daily.) 30  tablet 3   nitroGLYCERIN (NITROSTAT) 0.4 MG SL tablet Place 1 tablet (0.4 mg total) under the tongue every 5 (five) minutes as needed for chest pain. 25 tablet PRN   pantoprazole (PROTONIX) 40 MG tablet Take 1 tablet (40 mg total) by mouth 2 (two) times daily. TAKE 1 TABLET ONCE DAILY FOR REFLUX (Patient taking differently: Take 40 mg by mouth 2 (two) times daily.) 60 tablet 2   triamcinolone cream (KENALOG) 0.1 % Apply 1 application topically daily as needed (itching).     zinc sulfate 220 (50 Zn) MG capsule TAKE ONE CAPSULE ONCE DAILY (Patient taking  differently: Take 220 mg by mouth daily.) 30 capsule 1   No facility-administered medications prior to visit.    Allergies  Allergen Reactions   Crestor [Rosuvastatin] Swelling    GYNECOMASTIA     Review of Systems As per HPI.     Objective:    Physical Exam Vitals and nursing note reviewed.  Constitutional:      General: He is not in acute distress.    Appearance: He is not ill-appearing, toxic-appearing or diaphoretic.  Cardiovascular:     Rate and Rhythm: Normal rate and regular rhythm.     Heart sounds: Normal heart sounds. No murmur heard. Pulmonary:     Effort: Pulmonary effort is normal. No respiratory distress.  Abdominal:     General: Bowel sounds are normal. There is no distension.     Palpations: Abdomen is soft.     Tenderness: There is no abdominal tenderness. There is no guarding or rebound.  Musculoskeletal:     Right hand: Swelling (thumb) and tenderness (thumb) present. No bony tenderness. Decreased range of motion (thumb). Normal sensation. Normal capillary refill. Normal pulse.     Right lower leg: No edema.     Left lower leg: No edema.  Skin:    General: Skin is warm and dry.  Neurological:     Mental Status: He is alert and oriented to person, place, and time.  Psychiatric:        Mood and Affect: Mood normal.        Behavior: Behavior normal.    BP (!) 133/57   Pulse 75   Temp (!) 97.5 F (36.4  C) (Temporal)   Ht _0  (1.778 m)   Wt 145 lb 4 oz (65.9 kg)   BMI 20.84 kg/m  Wt Readings from Last 3 Encounters:  10/31/20 145 lb 4 oz (65.9 kg)  10/26/20 147 lb (66.7 kg)  09/30/20 149 lb (67.6 kg)    Health Maintenance Due  Topic Date Due   Zoster Vaccines- Shingrix (1 of 2) Never done   COVID-19 Vaccine (3 - Moderna risk series) 07/28/2019    There are no preventive care reminders to display for this patient.   Lab Results  Component Value Date   TSH 1.330 08/17/2020   Lab Results  Component Value Date   WBC 9.5 09/23/2020   HGB 11.4 (L) 09/23/2020   HCT 34.8 (L) 09/23/2020   MCV 93 09/23/2020   PLT 272 09/23/2020   Lab Results  Component Value Date   NA 139 09/23/2020   K 4.3 09/23/2020   CO2 24 09/23/2020   GLUCOSE 107 (H) 09/23/2020   BUN 22 09/23/2020   CREATININE 1.63 (H) 09/23/2020   BILITOT 0.7 09/23/2020   ALKPHOS 84 09/23/2020   AST 20 09/23/2020   ALT 11 09/23/2020   PROT 6.7 09/23/2020   ALBUMIN 4.2 09/23/2020   CALCIUM 9.4 09/23/2020   ANIONGAP 10 07/25/2020   EGFR 40 (L) 09/23/2020   GFR 59.83 (L) 12/10/2012   Lab Results  Component Value Date   CHOL 159 05/27/2019   Lab Results  Component Value Date   HDL 57 05/27/2019   Lab Results  Component Value Date   LDLCALC 89 05/27/2019   Lab Results  Component Value Date   TRIG 64 05/27/2019   Lab Results  Component Value Date   CHOLHDL 2.8 05/27/2019   Lab Results  Component Value Date   HGBA1C 5.5% 05/31/2014       Assessment &  Plan:   Tunis was seen today for fall.  Diagnoses and all orders for this visit:  Fall, initial encounter Negative xray today.  -     DG Finger Thumb Left; Future  Pain of left thumb Negative Xray today. Keflex ordered for cellulitis coverage.  -     DG Finger Thumb Left; Future -     cephALEXin (KEFLEX) 500 MG capsule; Take 1 capsule (500 mg total) by mouth 2 (two) times daily.  Complaint of melena History of GI bleed Hbg 10.5 today.  Isidor will call his GI doctor for further work up.  -     Hemoglobin, fingerstick  Return to office for new or worsening symptoms, or if symptoms persist.   The patient indicates understanding of these issues and agrees with the plan.  Gwenlyn Perking, FNP

## 2020-10-31 NOTE — Patient Instructions (Signed)

## 2020-10-31 NOTE — Patient Instructions (Signed)
  Lower Body: Toe Rise   Standing, place feet apart. Hold arms out for balance or use support. Rise up on toes. Hold _3___ seconds, then lower. Repeat immediately. Repeat __10__ times. Do __2__ sessions per day.   Half Squat to Chair   Stand with feet shoulder width apart. Push buttocks backward and lower slowly, sitting in chair lightly and returning to standing position. Complete _2_ sets of 10_ repetitions. Perform __2-3_ sessions per day.     Hip abduction   While sitting with good posture, tie theraband around knees and pull apart. Slowly resume starting position. x30 1-2 x day      SEATED MARCHING - ELASTIC BAND  Start by sitting in a chair with an elastic band wrapped around your lower thighs.  Next, move a knee upward, set it back down and then alternate to the other side. Perform 10-20 1-2x daily    SEATED BALL FLEXION  Start in a seated position in a chair while holding an exercise ball out to the side by one knee with elbows extended. Then, slowly lift the ball up over your opposite side shoulder and overhead. Return the ball to starting position and repeat.

## 2020-11-02 ENCOUNTER — Inpatient Hospital Stay (INDEPENDENT_AMBULATORY_CARE_PROVIDER_SITE_OTHER): Payer: MEDICARE

## 2020-11-02 ENCOUNTER — Ambulatory Visit: Payer: MEDICARE | Admitting: Physical Therapy

## 2020-11-02 ENCOUNTER — Other Ambulatory Visit: Payer: Self-pay

## 2020-11-02 DIAGNOSIS — I25810 Atherosclerosis of coronary artery bypass graft(s) without angina pectoris: Secondary | ICD-10-CM

## 2020-11-02 DIAGNOSIS — I493 Ventricular premature depolarization: Secondary | ICD-10-CM | POA: Diagnosis not present

## 2020-11-02 DIAGNOSIS — I440 Atrioventricular block, first degree: Secondary | ICD-10-CM | POA: Diagnosis not present

## 2020-11-02 DIAGNOSIS — R262 Difficulty in walking, not elsewhere classified: Secondary | ICD-10-CM | POA: Diagnosis not present

## 2020-11-02 DIAGNOSIS — R2681 Unsteadiness on feet: Secondary | ICD-10-CM

## 2020-11-02 DIAGNOSIS — M6281 Muscle weakness (generalized): Secondary | ICD-10-CM

## 2020-11-02 DIAGNOSIS — R296 Repeated falls: Secondary | ICD-10-CM

## 2020-11-02 DIAGNOSIS — I452 Bifascicular block: Secondary | ICD-10-CM

## 2020-11-02 NOTE — Therapy (Signed)
[facility] [place], [place], [address] Phone: [phone]   Fax:  [phone]  Physical Therapy Treatment  Patient Details  Name: Jon Reid MRN: [id number] Date of Birth: [birth year] Referring Provider (PT): Margette Fast   Encounter Date: 11/02/2020   PT End of Session - 11/02/20 1040     Visit Number 11    Number of Visits 12    Date for PT Re-Evaluation 11/09/20    PT Start Time 1030    PT Stop Time 1112    PT Time Calculation (min) 42 min    Activity Tolerance Patient tolerated treatment well    Behavior During Therapy [facility] for tasks assessed/performed             Past Medical History:  Diagnosis Date   Arthritis    Carcinoma of prostate ([place])    prostate   CKD (chronic kidney disease), stage III ([place])    Coronary artery disease    a. 10/2012 Cath: LM 20-30d, lAD 80p, 35m 80-90d, LCX 90-959mOM1 90, RCA 50p, 204m0d, RPDA 90, EF nl; b. 10/2012 CABG x 4 (LIMA-LAD, SVG-OM1, SVG-PDA->RPL); c. 07/2015 MV: Basal and proximal septal infarct w/o ischemia. EF 57%-->low risk.   First degree AV block 01/28/2019   Noted on EKG   GERD (gastroesophageal reflux disease)    Gout    HTN (hypertension)    Macrocytosis without anemia 01/08/2012   Moderate aortic stenosis    a. 10/2012 Echo: EF 55-60%, no rwma, BAE, mild AI, mild TR; b. 06/2020 Echo: EF 50-55%, no rwma, mild LVH. Mod dil LA. Mild MR. Mod AS.   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   Sleep apnea    Questionable   Spinal stenosis    [facility]   Past Surgical History:  Procedure Laterality Date   BALLOON DILATION N/A 10/03/2020   Procedure: BALLOON DILATION;  Surgeon: CarEloise HarmanO;  [facility];  Service: Endoscopy;  Laterality: N/A;   CARDIAC CATHETERIZATION  11/07/2012   Dr NahAcie FredricksonCATARACT EXTRACTION W/ INTRAOCULAR LENS IMPLANT Bilateral     CORONARY ARTERY BYPASS GRAFT N/A 11/11/2012   Procedure: CORONARY ARTERY BYPASS GRAFTING times four using Right Greater Saphenous Vein Graft harvested endoscopically and Left Internal Mammary Artery.;  Surgeon: PetIvin PootD;  [facility];  Laterality: N/A;   ESOPHAGEAL BRUSHING  10/03/2020   Procedure: ESOPHAGEAL BRUSHING;  Surgeon: CarEloise HarmanO;  [facility];  Service: Endoscopy;;   ESOPHAGOGASTRODUODENOSCOPY (EGD) WITH PROPOFOL N/A 06/27/2020   Surgeon: CarEloise HarmanO;  large hiatal hernia, LA grade D esophagitis with no bleeding, normal examined duodenum.    ESOPHAGOGASTRODUODENOSCOPY (EGD) WITH PROPOFOL N/A 10/03/2020   Procedure: ESOPHAGOGASTRODUODENOSCOPY (EGD) WITH PROPOFOL;  Surgeon: CarEloise HarmanO;  [facility];  Service: Endoscopy;  Laterality: N/A;  2:00pm   INGUINAL HERNIA REPAIR Right 01/10/2017   Procedure: OPEN RIGHT HERNIA REPAIR INGUINAL;  Surgeon: WhiIleana RoupD;  [facility];  Service: General;  Laterality: Right;   INSERTION OF MESH Right 01/10/2017   Procedure: INSERTION OF MESH;  Surgeon: WhiIleana RoupD;  [facility];  Service: General;  Laterality: Right;   INTRAOPERATIVE TRANSESOPHAGEAL ECHOCARDIOGRAM N/A 11/11/2012   Procedure: INTRAOPERATIVE TRANSESOPHAGEAL ECHOCARDIOGRAM;  Surgeon: PetIvin PootD;  [facility];  Service: Open Heart Surgery;  Laterality: N/A;   LEFT HEART CATHETERIZATION WITH CORONARY ANGIOGRAM N/A 11/07/2012   Procedure: LEFT HEART CATHETERIZATION WITH CORONARY ANGIOGRAM;  Surgeon: Thayer Headings, MD;  Location: [facility] CATH LAB;  Service: Cardiovascular;  Laterality: N/A;   LUMBAR LAMINECTOMY/DECOMPRESSION MICRODISCECTOMY N/A 02/05/2019   Procedure: LUMBAR LAMINECTOMY/DECOMPRESSION L3-L4;  Surgeon: Latanya Maudlin, MD;  [facility];  Service: Orthopedics;  Laterality: N/A;  48mn   LYMPH NODE DISSECTION     Bilateral pelvic    RETROPUBIC PROSTATECTOMY     Radical    There were no vitals filed for this visit.   Subjective Assessment - 11/02/20 1039     Subjective Covid-19 screen performed upon entering clinic. Patient reported doing well today, he went to MD regarding his fall and reported no fracture in thumb just inflammation.    Pertinent History CKD, CAD, HTN, history of stroke, CABG x4, lumbar laminectomy/decompression microdiscectomy 02/05/2019    Limitations Sitting;Standing;Walking    Currently in Pain? No/denies                               [facility] - 11/02/20 0001       Knee/Hip Exercises: Aerobic   Nustep L4 x170m UE/LE, monitored for progression                 Balance Exercises - 11/02/20 0001       Balance Exercises: Standing   Standing Eyes Opened Wide (BOA);Head turns;Solid surface    Tandem Stance Eyes open;Upper extremity support 1;5 reps;1 rep    Standing, One Foot on a Step Eyes open;8 inch;10 secs;4 reps    Tandem Gait Forward;Retro;Upper extremity support;5 reps;1 rep    Partial Tandem Stance Eyes open;Upper extremity support 1;4 reps    Sidestepping Upper extremity support;5 reps;1 rep    Marching Solid surface;Upper extremity assist 1;Forwards   2x10   Heel Raises 20 reps    Toe Raise Both;20 reps    Sit to Stand Standard surface   x15   Other Standing Exercises standing with SBOS, and staggered stance w trunck rotations x20 each way, overhead x20                    PT Long Term Goals - 10/31/20 1047       PT LONG TERM GOAL #1   Title Patient will be independent with HEP.    Baseline Met 10/31/20    Time 4    Period Weeks    Status Achieved      PT LONG TERM GOAL #2   Title Patient will perform 5x sit to stand test in 15 seconds or less with no UE support to improve balance and improve functional LE strength.    Baseline 5x in 17 seconds 10/31/20    Time 4    Period Weeks    Status On-going      PT  LONG TERM GOAL #3   Title Patient will decrease risk of falls as noted by a 4+ improvement on Berg Balance Scale.    Baseline 43/56  10/31/20    Time 4    Period Weeks    Status On-going      PT LONG TERM GOAL #4   Title Patient will demonstrate  4+/5 or greater bilateral LE MMT to improve stability during functional tasks.    Time 4  Period Weeks    Status On-going                   Plan - 11/02/20 1055     Clinical Impression Statement Patient tolerated treatment well today. Patient continues to improve SPM on nustep and improved activity tolerance. Patient progressing slowly with balance exercises due to LOB and difficulty with COG today. Patient required CGA/SBA for safety. Patient arrived with no assit device and was educated on walker or cane for safety. Goals ongoing this week.    Personal Factors and Comorbidities Comorbidity 3+;Age    Comorbidities PD, DM, fall risk, CHF    Examination-Activity Limitations Bed Mobility;Bend;Lift;Carry;Locomotion Level    Stability/Clinical Decision Making Stable/Uncomplicated    Rehab Potential Good    PT Frequency 2x / week    PT Duration 6 weeks    PT Treatment/Interventions ADLs/Self Care Home Management;Moist Heat;DME Instruction;Gait training;Therapeutic exercise;Therapeutic activities;Neuromuscular re-education;Patient/family education;Manual techniques;Energy conservation    PT Next Visit Plan cont with POC for balance and strength/sit to stand following thoracic mobility activty - modified reclined over physioball    Consulted and Agree with Plan of Care Patient             Patient will benefit from skilled therapeutic intervention in order to improve the following deficits and impairments:  Abnormal gait, Decreased balance, Decreased endurance, Decreased mobility, Impaired vision/preception, Increased edema, Decreased range of motion, Decreased activity tolerance, Impaired flexibility, Postural dysfunction, Pain,  Decreased strength  Visit Diagnosis: Difficulty in walking, not elsewhere classified  Repeated falls  Unsteadiness on feet  Muscle weakness (generalized)     Problem List Patient Active Problem List   Diagnosis Date Noted   Dizziness 09/23/2020   History of GI bleed 08/19/2020   Loss of weight 08/19/2020   Dysphagia 08/19/2020   Upper GI bleed 06/25/2020   COVID-19 virus infection 06/25/2020   [name] affect 02/09/2020   Iron deficiency anemia due to chronic blood loss 05/27/2019   Nonrheumatic mitral valve regurgitation 05/26/2019   Left anterior fascicular block 05/26/2019   Educated about COVID-19 virus infection 04/06/2019   Constipation 02/19/2019   Elevated troponin 02/09/2019   Fecal occult blood test positive 02/09/2019   Spinal stenosis, lumbar region with neurogenic claudication 02/05/2019   Leg swelling 01/26/2019   Nonrheumatic aortic valve stenosis 01/26/2019   Multiple joint pain 12/11/2018   Chronic pain of both knees 11/07/2018   Vitamin D deficiency 10/23/2018   Gastroesophageal reflux disease with esophagitis 03/13/2018   Murmur, cardiac 12/04/2017   Coronary artery disease involving native coronary artery of native heart without angina pectoris 12/04/2017   PVC's (premature ventricular contractions) 12/04/2017   Heart disease 08/21/2017   Weakness 11/14/2016   Abnormality of gait 12/06/2015   Abdominal aortic atherosclerosis ([place]) 12/01/2015   Degeneration of lumbar intervertebral disc 12/01/2015   Exertional angina (HCC) 07/20/2015   Hernia of abdominal wall 10/12/2014   Gynecomastia 04/09/2013   Hx of CABG 02/02/2013   Myelodysplastic syndrome 11/06/2012   Vitamin B12 deficiency 07/07/2012   Hyperlipemia 07/07/2012   Peripheral vascular disease ([place]) 04/14/2009   CARCINOMA, PROSTATE 04/13/2009   Non-thrombocytopenic purpura ([place]) 04/13/2009   Benign essential hypertension 04/13/2009   Chronic renal insufficiency, stage III (moderate)  ([place]) 04/13/2009   SLEEP APNEA 04/13/2009    Clotee Schlicker P, PTA 11/02/2020, [facility] [address] [place], [place], [address] Phone: [phone]   Fax:  [phone]  Name: LANNIS LICHTENWALNER MRN: [id number]  Date of Birth: Jan 17, 1929

## 2020-11-07 ENCOUNTER — Ambulatory Visit: Payer: MEDICARE | Admitting: Physical Therapy

## 2020-11-07 ENCOUNTER — Other Ambulatory Visit: Payer: Self-pay

## 2020-11-07 DIAGNOSIS — R2681 Unsteadiness on feet: Secondary | ICD-10-CM

## 2020-11-07 DIAGNOSIS — R296 Repeated falls: Secondary | ICD-10-CM

## 2020-11-07 DIAGNOSIS — R262 Difficulty in walking, not elsewhere classified: Secondary | ICD-10-CM | POA: Diagnosis not present

## 2020-11-07 DIAGNOSIS — M6281 Muscle weakness (generalized): Secondary | ICD-10-CM

## 2020-11-07 NOTE — Therapy (Signed)
[facility] [place], [place], [address] Phone: [phone]   Fax:  [phone]  PHYSICAL THERAPY DISCHARGE SUMMARY  Visits from Start of Care: 12  Current functional level related to goals / functional outcomes: High fall risk, patient refuses to utilize AD for safe ambulation    Remaining deficits: Gait limitations with unpredictable falls   Education / Equipment: HEP with strong recommendations to use quad can for safe ambulation.   Plan: Patient agrees to discharge.  Patient goals were partially met. Patient is being discharged due to meeting the stated rehab goals.        Patient Details  Name: Jon Reid MRN: [id number] Date of Birth: [birth year] Referring Provider (PT): Margette Fast   Encounter Date: 11/07/2020   PT End of Session - 11/07/20 1035     Visit Number 12    Number of Visits 12    Date for PT Re-Evaluation 11/09/20    PT Start Time 1031    PT Stop Time 1112    PT Time Calculation (min) 41 min    Activity Tolerance Patient tolerated treatment well    Behavior During Therapy [facility] for tasks assessed/performed             Past Medical History:  Diagnosis Date   Arthritis    Carcinoma of prostate ([place])    prostate   CKD (chronic kidney disease), stage III ([place])    Coronary artery disease    a. 10/2012 Cath: LM 20-30d, lAD 80p, 62m, 80-90d, LCX 90-85m, OM1 90, RCA 50p, 81m, 50d, RPDA 90, EF nl; b. 10/2012 CABG x 4 (LIMA-LAD, SVG-OM1, SVG-PDA->RPL); c. 07/2015 MV: Basal and proximal septal infarct w/o ischemia. EF 57%-->low risk.   First degree AV block 01/28/2019   Noted on EKG   GERD (gastroesophageal reflux disease)    Gout    HTN (hypertension)    Macrocytosis without anemia 01/08/2012   Moderate aortic stenosis    a. 10/2012 Echo: EF 55-60%, no rwma, BAE, mild AI, mild TR; b. 06/2020 Echo: EF 50-55%, no rwma, mild LVH. Mod dil LA. Mild MR. Mod AS.   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   Sleep apnea    Questionable   Spinal stenosis    Stroke [facility])     Past Surgical History:  Procedure Laterality Date   BALLOON DILATION N/A 10/03/2020   Procedure: BALLOON DILATION;  Surgeon: [name], DO;  [facility];  Service: Endoscopy;  Laterality: N/A;   CARDIAC CATHETERIZATION  11/07/2012   Dr Acie Fredrickson   CATARACT EXTRACTION W/ INTRAOCULAR LENS IMPLANT Bilateral    CORONARY ARTERY BYPASS GRAFT N/A 11/11/2012   Procedure: CORONARY ARTERY BYPASS GRAFTING times four using Right Greater Saphenous Vein Graft harvested endoscopically and Left Internal Mammary Artery.;  Surgeon: Ivin Poot, MD;  Location: [place];  Service: Open Heart Surgery;  Laterality: N/A;   ESOPHAGEAL BRUSHING  10/03/2020   Procedure: ESOPHAGEAL BRUSHING;  Surgeon: [name], DO;  [facility];  Service: Endoscopy;;   ESOPHAGOGASTRODUODENOSCOPY (EGD) WITH PROPOFOL N/A 06/27/2020   Surgeon: [name], DO;  large hiatal hernia, LA grade D esophagitis with no bleeding, normal examined duodenum.    ESOPHAGOGASTRODUODENOSCOPY (EGD) WITH PROPOFOL N/A 10/03/2020   Procedure: ESOPHAGOGASTRODUODENOSCOPY (EGD) WITH PROPOFOL;  Surgeon: [name], DO;  [facility];  Service:  Endoscopy;  Laterality: N/A;  2:00pm   INGUINAL HERNIA REPAIR Right 01/10/2017   Procedure: OPEN RIGHT HERNIA REPAIR INGUINAL;  Surgeon: Ileana Roup, MD;  [facility];  Service: General;  Laterality: Right;   INSERTION OF MESH Right 01/10/2017   Procedure: INSERTION OF MESH;  Surgeon: Ileana Roup, MD;  [facility];  Service: General;  Laterality: Right;   INTRAOPERATIVE TRANSESOPHAGEAL ECHOCARDIOGRAM N/A 11/11/2012   Procedure: INTRAOPERATIVE TRANSESOPHAGEAL ECHOCARDIOGRAM;  Surgeon: Ivin Poot, MD;  Location: [place];  Service: Open Heart Surgery;  Laterality: N/A;   LEFT HEART  CATHETERIZATION WITH CORONARY ANGIOGRAM N/A 11/07/2012   Procedure: LEFT HEART CATHETERIZATION WITH CORONARY ANGIOGRAM;  Surgeon: Thayer Headings, MD;  Location: [facility] CATH LAB;  Service: Cardiovascular;  Laterality: N/A;   LUMBAR LAMINECTOMY/DECOMPRESSION MICRODISCECTOMY N/A 02/05/2019   Procedure: LUMBAR LAMINECTOMY/DECOMPRESSION L3-L4;  Surgeon: Latanya Maudlin, MD;  [facility];  Service: Orthopedics;  Laterality: N/A;  46mn   LYMPH NODE DISSECTION     Bilateral pelvic   RETROPUBIC PROSTATECTOMY     Radical    There were no vitals filed for this visit.   Subjective Assessment - 11/07/20 1035     Subjective Covid-19 screen performed upon entering clinic. Patient arrived doing well today.    Pertinent History CKD, CAD, HTN, history of stroke, CABG x4, lumbar laminectomy/decompression microdiscectomy 02/05/2019    Limitations Sitting;Standing;Walking    Currently in Pain? No/denies                [facility] - 11/07/20 0001       Strength   Strength Assessment Site Hip;Knee    Right/Left Hip Right;Left    Right Hip Flexion 4/5    Right Hip Extension 4/5    Right Hip External Rotation  3/5;4/5    Right Hip Internal Rotation 4/5    Right Hip ABduction 3+/5    Left Hip Flexion 4/5    Left Hip Extension 3+/5    Left Hip External Rotation 4/5    Left Hip Internal Rotation 4/5    Left Hip ABduction 4/5    Left Hip ADduction 4+/5                           [facility] Adult PT Treatment/Exercise - 11/07/20 0001       Ambulation/Gait   Ambulation/Gait Yes    Ambulation/Gait Assistance 5: Supervision    Assistive device Rolling walker;Straight cane    Gait Pattern Step-through pattern;Decreased arm swing - right;Decreased step length - right;Decreased stance time - right;Decreased dorsiflexion - right;Decreased dorsiflexion - left;Shuffle;Trunk flexed;Narrow base of support    Gait Comments education on both assist device to encourage patient to use at  home with proper technique      Berg Balance Test   Sit to Stand Able to stand without using hands and stabilize independently    Standing Unsupported Able to stand safely 2 minutes    Sitting with Back Unsupported but Feet Supported on Floor or Stool Able to sit safely and securely 2 minutes    Stand to Sit Sits safely with minimal use of hands    Transfers Able to transfer safely, minor use of hands    Standing Unsupported with Eyes Closed Able to stand 10 seconds safely    Standing Ubsupported with Feet Together Able to place feet together independently and stand 1 minute safely    From Standing, Reach Forward with Outstretched  Arm Can reach forward >12 cm safely (5")    From Standing Position, Pick up Object from [place] to pick up shoe safely and easily    From Standing Position, Turn to Look Behind Over each Shoulder Turn sideways only but maintains balance    Turn 360 Degrees Able to turn 360 degrees safely one side only in 4 seconds or less    Standing Unsupported, Alternately Place Feet on Step/Stool Able to complete 4 steps without aid or supervision    Standing Unsupported, One Foot in Front Needs help to step but can hold 15 seconds    Standing on One Leg Unable to try or needs assist to prevent fall    Total Score 43      Knee/Hip Exercises: Aerobic   Nustep L4 x33min UE/LE, monitored for progression      Knee/Hip Exercises: Seated   Sit to Sand --                 Balance Exercises - 11/07/20 0001       Balance Exercises: Standing   Standing, One Foot on a Step Eyes open;8 inch;10 secs;4 reps   with head turns   Stepping Strategy Anterior;Lateral;10 reps   bil LE with reachning   Sidestepping 5 reps;1 rep    Heel Raises 20 reps   uni UE support   Sit to Stand Standard surface   x15   Other Standing Exercises standing with SBOS, and staggered stance w trunck rotations x20 each way, overhead x20   used ball for visual cues                   PT  Long Term Goals - 11/07/20 1038       PT LONG TERM GOAL #1   Title Patient will be independent with HEP.    Baseline Met 10/31/20    Time 4    Period Weeks    Status Achieved      PT LONG TERM GOAL #2   Title Patient will perform 5x sit to stand test in 15 seconds or less with no UE support to improve balance and improve functional LE strength.    Baseline 5x in 16 seconds 11/07/20    Time 4    Period Weeks    Status Not Met      PT LONG TERM GOAL #3   Title Patient will decrease risk of falls as noted by a 4+ improvement on Berg Balance Scale.    Baseline 43/56  11/07/20    Time 4    Period Weeks    Status Not Met      PT LONG TERM GOAL #4   Title Patient will demonstrate  4+/5 or greater bilateral LE MMT to improve stability during functional tasks.    Baseline see flow sheet    Time 4    Period Weeks    Status Not Met                   Plan - 11/07/20 1044     Clinical Impression Statement Patient DC today per PT. Patient continues to arrive with no assist device and continues to have multiple falls. Today focused on education to patient on assist device and use with walker and cane. Patient unable to meet all current goals.    Personal Factors and Comorbidities Comorbidity 3+;Age    Comorbidities PD, DM, fall risk, CHF    Examination-Activity Limitations Bed Mobility;Bend;Lift;Carry;Locomotion Level  Stability/Clinical Decision Making Stable/Uncomplicated    Rehab Potential Good    PT Frequency 2x / week    PT Duration 6 weeks    PT Treatment/Interventions ADLs/Self Care Home Management;Moist Heat;DME Instruction;Gait training;Therapeutic exercise;Therapeutic activities;Neuromuscular re-education;Patient/family education;Manual techniques;Energy conservation    PT Next Visit Plan cont with POC for balance and strength/sit to stand following thoracic mobility activty - modified reclined over physioball    Consulted and Agree with Plan of Care Patient              Patient will benefit from skilled therapeutic intervention in order to improve the following deficits and impairments:  Abnormal gait, Decreased balance, Decreased endurance, Decreased mobility, Impaired vision/preception, Increased edema, Decreased range of motion, Decreased activity tolerance, Impaired flexibility, Postural dysfunction, Pain, Decreased strength  Visit Diagnosis: Difficulty in walking, not elsewhere classified  Repeated falls  Unsteadiness on feet  Muscle weakness (generalized)     Problem List Patient Active Problem List   Diagnosis Date Noted   Dizziness 09/23/2020   History of GI bleed 08/19/2020   Loss of weight 08/19/2020   Dysphagia 08/19/2020   Upper GI bleed 06/25/2020   COVID-19 virus infection 06/25/2020   [name] affect 02/09/2020   Iron deficiency anemia due to chronic blood loss 05/27/2019   Nonrheumatic mitral valve regurgitation 05/26/2019   Left anterior fascicular block 05/26/2019   Educated about COVID-19 virus infection 04/06/2019   Constipation 02/19/2019   Elevated troponin 02/09/2019   Fecal occult blood test positive 02/09/2019   Spinal stenosis, lumbar region with neurogenic claudication 02/05/2019   Leg swelling 01/26/2019   Nonrheumatic aortic valve stenosis 01/26/2019   Multiple joint pain 12/11/2018   Chronic pain of both knees 11/07/2018   Vitamin D deficiency 10/23/2018   Gastroesophageal reflux disease with esophagitis 03/13/2018   Murmur, cardiac 12/04/2017   Coronary artery disease involving native coronary artery of native heart without angina pectoris 12/04/2017   PVC's (premature ventricular contractions) 12/04/2017   Heart disease 08/21/2017   Weakness 11/14/2016   Abnormality of gait 12/06/2015   Abdominal aortic atherosclerosis ([place]) 12/01/2015   Degeneration of lumbar intervertebral disc 12/01/2015   Exertional angina (HCC) 07/20/2015   Hernia of abdominal wall 10/12/2014   Gynecomastia  04/09/2013   Hx of CABG 02/02/2013   Myelodysplastic syndrome 11/06/2012   Vitamin B12 deficiency 07/07/2012   Hyperlipemia 07/07/2012   Peripheral vascular disease ([place]) 04/14/2009   CARCINOMA, PROSTATE 04/13/2009   Non-thrombocytopenic purpura ([place]) 04/13/2009   Benign essential hypertension 04/13/2009   Chronic renal insufficiency, stage III (moderate) ([place]) 04/13/2009   SLEEP APNEA 04/13/2009   Ladean Raya, PTA 11/07/20 11:14 AM   Therapy screening examination/treatment/procedure(s) were performed by therapist assistant and as primary therapist I was immediately available for consultation/collaboration.  Billy Coast PT, DPT    [facility] [place], [place], [address] Phone: [phone]   Fax:  [phone]  Name: CORDON GASSETT MRN: [id number] Date of Birth: 09/24/1928

## 2020-11-08 ENCOUNTER — Ambulatory Visit (INDEPENDENT_AMBULATORY_CARE_PROVIDER_SITE_OTHER): Payer: MEDICARE | Admitting: Family Medicine

## 2020-11-08 ENCOUNTER — Encounter: Payer: Self-pay | Admitting: Family Medicine

## 2020-11-08 VITALS — BP 94/60 | HR 91 | Temp 98.2°F | Resp 22 | Ht 70.0 in | Wt 150.0 lb

## 2020-11-08 DIAGNOSIS — R710 Precipitous drop in hematocrit: Secondary | ICD-10-CM | POA: Diagnosis not present

## 2020-11-08 DIAGNOSIS — R5383 Other fatigue: Secondary | ICD-10-CM | POA: Diagnosis not present

## 2020-11-08 DIAGNOSIS — E559 Vitamin D deficiency, unspecified: Secondary | ICD-10-CM | POA: Diagnosis not present

## 2020-11-08 DIAGNOSIS — E538 Deficiency of other specified B group vitamins: Secondary | ICD-10-CM

## 2020-11-08 DIAGNOSIS — R54 Age-related physical debility: Secondary | ICD-10-CM | POA: Diagnosis not present

## 2020-11-08 NOTE — Progress Notes (Signed)
Subjective:  Patient ID: Jon Reid, male    DOB: 1928/09/29, 87 y.o.   MRN: [id number]  Patient Care Team: Claretta Fraise, MD as PCP - General (Family Medicine) Minus Breeding, MD as PCP - Cardiology (Cardiology) Festus Aloe, MD (Urology) Curt Bears, MD (Hematology and Oncology) Warden Fillers, MD as Consulting Physician (Ophthalmology) Juanita Craver, MD as Consulting Physician (Gastroenterology) Latanya Maudlin, MD as Consulting Physician (Orthopedic Surgery) Kathrynn Ducking, MD as Consulting Physician (Neurology) Josue Hector, MD as Consulting Physician (Cardiology)   Chief Complaint:  Fatigue   HPI: Jon Reid is a 87 y.o. male presenting on 11/08/2020 for Fatigue   Pt presents today with worsening fatigue. States he has been feeling bad for the last few months and feels the fatigue is getting worse. He complains of problems ambulating due to knee pain and generalized weakness. States his blood count has been low but he has not received any transfusions. He is not on Vit B12 repletion anymore. He is taking Vit D as prescribed. He denies recent injury or illness. Has been seen by cardiology. Last notes reviewed. Last labs reviewed with noted Hgb 10.5. Last Vit B 12 in 06/2020 and was 189.     Relevant past medical, surgical, family, and social history reviewed and updated as indicated.  Allergies and medications reviewed and updated. Data reviewed: Chart in Epic.   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 10/2012 Cath: LM 20-30d, lAD 80p, 97m 80-90d, LCX 90-912mOM1 90, RCA 50p, 2049m0d, RPDA 90, EF nl; b. 10/2012 CABG x 4 (LIMA-LAD, SVG-OM1, SVG-PDA->RPL); c. 07/2015 MV: Basal and proximal septal infarct w/o ischemia. EF 57%-->low risk.   First degree AV block 01/28/2019   Noted on EKG   GERD (gastroesophageal  reflux disease)    Gout    HTN (hypertension)    Macrocytosis without anemia 01/08/2012   Moderate aortic stenosis    a. 10/2012 Echo: EF 55-60%, no rwma, BAE, mild AI, mild TR; b. 06/2020 Echo: EF 50-55%, no rwma, mild LVH. Mod dil LA. Mild MR. Mod AS.   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   Sleep apnea    Questionable   Spinal stenosis    [facility]   Past Surgical History:  Procedure Laterality Date   BALLOON DILATION N/A 10/03/2020   Procedure: BALLOON DILATION;  Surgeon: CarEloise HarmanO;  [facility];  Service: Endoscopy;  Laterality: N/A;   CARDIAC CATHETERIZATION  11/07/2012   Dr NahAcie FredricksonCATARACT EXTRACTION W/ INTRAOCULAR LENS IMPLANT Bilateral    CORONARY ARTERY BYPASS GRAFT N/A 11/11/2012   Procedure: CORONARY ARTERY BYPASS GRAFTING times four using Right Greater Saphenous Vein Graft harvested endoscopically and Left Internal Mammary Artery.;  Surgeon: PetIvin PootD;  [facility];  Laterality: N/A;   ESOPHAGEAL BRUSHING  10/03/2020   Procedure: ESOPHAGEAL BRUSHING;  Surgeon: CarEloise HarmanO;  [facility];  Service: Endoscopy;;   ESOPHAGOGASTRODUODENOSCOPY (EGD) WITH PROPOFOL N/A 06/27/2020   Surgeon: CarEloise HarmanO;  large hiatal hernia, LA grade D esophagitis with no bleeding, normal examined duodenum.    ESOPHAGOGASTRODUODENOSCOPY (EGD)  WITH PROPOFOL N/A 10/03/2020   Procedure: ESOPHAGOGASTRODUODENOSCOPY (EGD) WITH PROPOFOL;  Surgeon: [name], DO;  [facility];  Service: Endoscopy;  Laterality: N/A;  2:00pm   INGUINAL HERNIA REPAIR Right 01/10/2017   Procedure: OPEN RIGHT HERNIA REPAIR INGUINAL;  Surgeon: Ileana Roup, MD;  Location: [facility];  Service: General;  Laterality: Right;   INSERTION OF MESH Right 01/10/2017   Procedure: INSERTION OF MESH;  Surgeon: Ileana Roup, MD;   Location: [facility];  Service: General;  Laterality: Right;   INTRAOPERATIVE TRANSESOPHAGEAL ECHOCARDIOGRAM N/A 11/11/2012   Procedure: INTRAOPERATIVE TRANSESOPHAGEAL ECHOCARDIOGRAM;  Surgeon: Ivin Poot, MD;  Location: [place];  Service: Open Heart Surgery;  Laterality: N/A;   LEFT HEART CATHETERIZATION WITH CORONARY ANGIOGRAM N/A 11/07/2012   Procedure: LEFT HEART CATHETERIZATION WITH CORONARY ANGIOGRAM;  Surgeon: Thayer Headings, MD;  [facility] CATH LAB;  Service: Cardiovascular;  Laterality: N/A;   LUMBAR LAMINECTOMY/DECOMPRESSION MICRODISCECTOMY N/A 02/05/2019   Procedure: LUMBAR LAMINECTOMY/DECOMPRESSION L3-L4;  Surgeon: Latanya Maudlin, MD;  Location: [facility];  Service: Orthopedics;  Laterality: N/A;  60mn   LYMPH NODE DISSECTION     Bilateral pelvic   RETROPUBIC PROSTATECTOMY     Radical    Social History   Socioeconomic History   Marital status: Married    Spouse name: [name]  Number of children: 3   Years of education: 12   Highest education level: High school graduate  Occupational History   Occupation: Retired  Tobacco Use   Smoking status: Never   Smokeless tobacco: Never  VScientific laboratory technicianUse: Never used  Substance and Sexual Activity   Alcohol use: Yes    Comment: Rare   Drug use: No   Sexual activity: Not Currently  Other Topics Concern   Not on file  Social History Narrative   Lives at home with wife/sons will stay with them occassionally   Married with 3 children   Denies caffeine use    Social Determinants of HRadio broadcast assistantStrain: Not on file  Food Insecurity: Not on file  Transportation Needs: Not on file  Physical Activity: Not on file  Stress: Not on file  Social Connections: Not on file  Intimate Partner Violence: Not on file    Outpatient Encounter Medications as of 11/08/2020  Medication Sig   ascorbic acid (VITAMIN C) 500 MG tablet Take 1 tablet (500 mg total) by mouth daily.   aspirin EC 81 MG tablet Take 81 mg by mouth  daily.   carbidopa-levodopa (SINEMET IR) 25-100 MG tablet Take 1.5 tablets by mouth 3 (three) times daily.   Cholecalciferol (VITAMIN D) 2000 units CAPS Take 2,000 Units by mouth daily.   fluticasone (FLONASE) 50 MCG/ACT nasal spray One to 2 sprays each nostril at bedtime (Patient taking differently: Place 2 sprays into both nostrils at bedtime.)   furosemide (LASIX) 40 MG tablet TAKE 1 TABLET DAILY   Iron, Ferrous Sulfate, 325 (65 Fe) MG TABS Take 1 tablet by mouth daily. (Patient taking differently: Take 325 mg by mouth daily.)   pantoprazole (PROTONIX) 40 MG tablet Take 1 tablet (40 mg total) by mouth 2 (two) times daily. TAKE 1 TABLET ONCE DAILY FOR REFLUX (Patient taking differently: Take 40 mg by mouth 2 (two) times daily.)   triamcinolone cream (KENALOG) 0.1 % Apply 1 application topically daily as needed (itching).   zinc sulfate 220 (50 Zn) MG capsule TAKE ONE CAPSULE ONCE DAILY (Patient taking differently: Take 220  mg by mouth daily.)   cephALEXin (KEFLEX) 500 MG capsule Take 1 capsule (500 mg total) by mouth 2 (two) times daily.   nitroGLYCERIN (NITROSTAT) 0.4 MG SL tablet Place 1 tablet (0.4 mg total) under the tongue every 5 (five) minutes as needed for chest pain. (Patient not taking: Reported on 11/08/2020)   No facility-administered encounter medications on file as of 11/08/2020.    Allergies  Allergen Reactions   Crestor [Rosuvastatin] Swelling    GYNECOMASTIA     Review of Systems  Constitutional:  Positive for activity change, appetite change and fatigue. Negative for chills, diaphoresis, fever and unexpected weight change.  HENT: Negative.    Eyes: Negative.   Respiratory:  Negative for apnea, cough, choking, chest tightness, shortness of breath, wheezing and stridor.   Cardiovascular:  Positive for leg swelling. Negative for chest pain and palpitations.  Gastrointestinal:  Negative for abdominal distention, abdominal pain, anal bleeding, blood in stool, constipation,  diarrhea, nausea, rectal pain and vomiting.  Endocrine: Negative.   Genitourinary:  Negative for decreased urine volume, difficulty urinating, dysuria, frequency and urgency.  Musculoskeletal:  Positive for arthralgias, back pain and gait problem. Negative for joint swelling, neck pain and neck stiffness.  Skin:  Positive for pallor.  Allergic/Immunologic: Negative.   Neurological:  Positive for weakness (generalized). Negative for dizziness, tremors, seizures, syncope, facial asymmetry, speech difficulty, light-headedness, numbness and headaches.  Hematological: Negative.   Psychiatric/Behavioral:  Negative for confusion, hallucinations, sleep disturbance and suicidal ideas.   All other systems reviewed and are negative.      Objective:  BP 94/60   Pulse 91   Temp 98.2 F (36.8 C)   Resp (!) 22   Ht 5' 10" (1.778 m)   Wt 150 lb (68 kg)   SpO2 97%   BMI 21.52 kg/m    Wt Readings from Last 3 Encounters:  11/08/20 150 lb (68 kg)  10/31/20 145 lb 4 oz (65.9 kg)  10/26/20 147 lb (66.7 kg)    Physical Exam Vitals and nursing note reviewed.  Constitutional:      General: He is not in acute distress.    Appearance: He is well-developed and well-groomed. He is not ill-appearing, toxic-appearing or diaphoretic.     Comments: Frail  HENT:     Head: Normocephalic and atraumatic.     Jaw: There is normal jaw occlusion.     Right Ear: Hearing normal.     Left Ear: Hearing normal.     Nose: Nose normal.     Mouth/Throat:     Lips: Pink.     Mouth: Mucous membranes are moist.     Pharynx: Oropharynx is clear. Uvula midline.  Eyes:     General: Lids are normal.     Extraocular Movements: Extraocular movements intact.     Conjunctiva/sclera: Conjunctivae normal.     Pupils: Pupils are equal, round, and reactive to light.  Neck:     Thyroid: No thyroid mass, thyromegaly or thyroid tenderness.     Vascular: No carotid bruit or JVD.     Trachea: Trachea and phonation normal.   Cardiovascular:     Rate and Rhythm: Normal rate and regular rhythm.     Chest Wall: PMI is not displaced.     Pulses: Normal pulses.     Heart sounds: Murmur heard.  Systolic (loudest at RUSB, radiates to carotid) murmur is present with a grade of 3/6.    No friction rub. No gallop. No S3 or S4  sounds.  Pulmonary:     Effort: Pulmonary effort is normal. No respiratory distress.     Breath sounds: Normal breath sounds. No wheezing.  Abdominal:     General: Bowel sounds are normal. There is no distension or abdominal bruit.     Palpations: Abdomen is soft. There is no hepatomegaly or splenomegaly.     Tenderness: There is no abdominal tenderness. There is no right CVA tenderness or left CVA tenderness.     Hernia: No hernia is present.  Musculoskeletal:        General: Normal range of motion.     Cervical back: Normal range of motion and neck supple.     Right lower leg: 1+ Edema present.     Left lower leg: 1+ Edema present.     Comments: hyperkyphosis  Lymphadenopathy:     Cervical: No cervical adenopathy.  Skin:    General: Skin is warm and dry.     Capillary Refill: Capillary refill takes less than 2 seconds.     Coloration: Skin is not cyanotic, jaundiced or pale.     Findings: No rash.  Neurological:     General: No focal deficit present.     Mental Status: He is alert and oriented to person, place, and time.     Cranial Nerves: Cranial nerves are intact. No cranial nerve deficit.     Sensory: Sensation is intact. No sensory deficit.     Motor: Weakness (generalized) present.     Coordination: Coordination is intact. Coordination normal.     Gait: Gait abnormal (slow).     Deep Tendon Reflexes: Reflexes are normal and symmetric. Reflexes normal.  Psychiatric:        Attention and Perception: Attention and perception normal.        Mood and Affect: Mood and affect normal.        Speech: Speech normal.        Behavior: Behavior normal. Behavior is cooperative.         Thought Content: Thought content normal.        Cognition and Memory: Cognition normal. Memory is impaired (vague with complaints and history, unable to recall specifics).        Judgment: Judgment normal.    Results for orders placed or performed in visit on 10/31/20  Hemoglobin, fingerstick  Result Value Ref Range   Hemoglobin 10.5 (L) 12.6 - 17.7 g/dL       Pertinent labs & imaging results that were available during my care of the patient were reviewed by me and considered in my medical decision making.  Assessment & Plan:  Jon Reid was seen today for fatigue.  Diagnoses and all orders for this visit:  Frail elderly  Other fatigue -     CBC with Differential/Platelet -     Vitamin B12 -     VITAMIN D 25 Hydroxy (Vit-D Deficiency, Fractures) -     Anemia Profile B -     BMP8+EGFR  Hemoglobin decreased -     CBC with Differential/Platelet -     Anemia Profile B  Vitamin B12 deficiency -     Vitamin B12  Vitamin D deficiency -     VITAMIN D 25 Hydroxy (Vit-D Deficiency, Fractures)    Continue all other maintenance medications.  Follow up plan: Return in about 3 months (around 02/08/2021), or if symptoms worsen or fail to improve.   Continue healthy lifestyle choices, including diet (rich in fruits, vegetables, and lean proteins,  and low in salt and simple carbohydrates) and exercise (at least 30 minutes of moderate physical activity daily).  Educational handout given for fatigue  The above assessment and management plan was discussed with the patient. The patient verbalized understanding of and has agreed to the management plan. Patient is aware to call the clinic if they develop any new symptoms or if symptoms persist or worsen. Patient is aware when to return to the clinic for a follow-up visit. Patient educated on when it is appropriate to go to the emergency department.   Monia Pouch, FNP-C [facility] [phone]

## 2020-11-09 LAB — ANEMIA PROFILE B
Basophils Absolute: 0.1 10*3/uL (ref 0.0–0.2)
Basos: 1 %
EOS (ABSOLUTE): 0.1 10*3/uL (ref 0.0–0.4)
Eos: 1 %
Ferritin: 52 ng/mL (ref 30–400)
Folate: >20 ng/mL (ref >3.0)
Hematocrit: 29.7 % — ABNORMAL LOW (ref 37.5–51.0)
Hemoglobin: 9.7 g/dL — ABNORMAL LOW (ref 13.0–17.7)
Immature Grans (Abs): 0 10*3/uL (ref 0.0–0.1)
Immature Granulocytes: 0 %
Iron Saturation: 15 % (ref 15–55)
Iron: 32 ug/dL — ABNORMAL LOW (ref 38–169)
Lymphocytes Absolute: 2.6 10*3/uL (ref 0.7–3.1)
Lymphs: 36 %
MCH: 30.6 pg (ref 26.6–33.0)
MCHC: 32.7 g/dL (ref 31.5–35.7)
MCV: 94 fL (ref 79–97)
Monocytes Absolute: 0.6 10*3/uL (ref 0.1–0.9)
Monocytes: 8 %
Neutrophils Absolute: 4 10*3/uL (ref 1.4–7.0)
Neutrophils: 54 %
Platelets: 408 10*3/uL (ref 150–450)
RBC: 3.17 x10E6/uL — ABNORMAL LOW (ref 4.14–5.80)
RDW: 14.5 % (ref 11.6–15.4)
Retic Ct Pct: 2.2 % (ref 0.6–2.6)
Total Iron Binding Capacity: 213 ug/dL — ABNORMAL LOW (ref 250–450)
UIBC: 181 ug/dL (ref 111–343)
Vitamin B-12: 788 pg/mL (ref 232–1245)
WBC: 7.4 10*3/uL (ref 3.4–10.8)

## 2020-11-09 LAB — VITAMIN D 25 HYDROXY (VIT D DEFICIENCY, FRACTURES): Vit D, 25-Hydroxy: 31.3 ng/mL (ref 30.0–100.0)

## 2020-11-09 LAB — BMP8+EGFR
BUN/Creatinine Ratio: 12 (ref 10–24)
BUN: 17 mg/dL (ref 10–36)
CO2: 24 mmol/L (ref 20–29)
Calcium: 8.9 mg/dL (ref 8.6–10.2)
Chloride: 104 mmol/L (ref 96–106)
Creatinine, Ser: 1.46 mg/dL — ABNORMAL HIGH (ref 0.76–1.27)
Glucose: 120 mg/dL — ABNORMAL HIGH (ref 65–99)
Potassium: 3.6 mmol/L (ref 3.5–5.2)
Sodium: 142 mmol/L (ref 134–144)
eGFR: 45 mL/min/{1.73_m2} — ABNORMAL LOW (ref >59)

## 2020-11-10 NOTE — Telephone Encounter (Signed)
Called patient today to check in and see how he was doing from a GI standpoint.  States he is doing well.  Has occasional difficulty swallowing.  Did have yeast in his esophagus though he wanted to hold off on treating this as he already "takes too many pills."  At the end of our conversation, patient made mention that during his procedure he lost 4 of his bottom teeth.  Further inquired if these were dentures or his real teeth.  He states they were his real teeth.  Also mentioned a gold crown that was missing.    I then spoke with [facility] in Endo and relayed this information to her.

## 2020-11-11 ENCOUNTER — Emergency Department (HOSPITAL_BASED_OUTPATIENT_CLINIC_OR_DEPARTMENT_OTHER): Payer: MEDICARE

## 2020-11-11 ENCOUNTER — Encounter (HOSPITAL_COMMUNITY): Payer: Self-pay | Admitting: *Deleted

## 2020-11-11 ENCOUNTER — Emergency Department (HOSPITAL_COMMUNITY): Payer: MEDICARE

## 2020-11-11 ENCOUNTER — Other Ambulatory Visit: Payer: Self-pay

## 2020-11-11 ENCOUNTER — Inpatient Hospital Stay (HOSPITAL_COMMUNITY)
Admission: EM | Admit: 2020-11-11 | Discharge: 2020-11-17 | DRG: 521 | Disposition: A | Discharge status: Rehabilitation Facility | Payer: MEDICARE | Attending: Family Medicine | Admitting: Family Medicine

## 2020-11-11 DIAGNOSIS — Z043 Encounter for examination and observation following other accident: Secondary | ICD-10-CM | POA: Diagnosis not present

## 2020-11-11 DIAGNOSIS — Z79899 Other long term (current) drug therapy: Secondary | ICD-10-CM | POA: Diagnosis not present

## 2020-11-11 DIAGNOSIS — D539 Nutritional anemia, unspecified: Secondary | ICD-10-CM | POA: Diagnosis present

## 2020-11-11 DIAGNOSIS — R0989 Other specified symptoms and signs involving the circulatory and respiratory systems: Secondary | ICD-10-CM | POA: Diagnosis not present

## 2020-11-11 DIAGNOSIS — I350 Nonrheumatic aortic (valve) stenosis: Secondary | ICD-10-CM | POA: Diagnosis not present

## 2020-11-11 DIAGNOSIS — E876 Hypokalemia: Secondary | ICD-10-CM | POA: Diagnosis present

## 2020-11-11 DIAGNOSIS — Z7189 Other specified counseling: Secondary | ICD-10-CM

## 2020-11-11 DIAGNOSIS — I959 Hypotension, unspecified: Secondary | ICD-10-CM | POA: Diagnosis not present

## 2020-11-11 DIAGNOSIS — Z96641 Presence of right artificial hip joint: Secondary | ICD-10-CM | POA: Diagnosis not present

## 2020-11-11 DIAGNOSIS — R1312 Dysphagia, oropharyngeal phase: Secondary | ICD-10-CM | POA: Diagnosis not present

## 2020-11-11 DIAGNOSIS — Z823 Family history of stroke: Secondary | ICD-10-CM

## 2020-11-11 DIAGNOSIS — U071 COVID-19: Secondary | ICD-10-CM | POA: Diagnosis present

## 2020-11-11 DIAGNOSIS — Z96649 Presence of unspecified artificial hip joint: Secondary | ICD-10-CM | POA: Diagnosis not present

## 2020-11-11 DIAGNOSIS — S7291XA Unspecified fracture of right femur, initial encounter for closed fracture: Secondary | ICD-10-CM | POA: Diagnosis present

## 2020-11-11 DIAGNOSIS — D631 Anemia in chronic kidney disease: Secondary | ICD-10-CM | POA: Diagnosis present

## 2020-11-11 DIAGNOSIS — Z888 Allergy status to other drugs, medicaments and biological substances status: Secondary | ICD-10-CM

## 2020-11-11 DIAGNOSIS — M109 Gout, unspecified: Secondary | ICD-10-CM | POA: Diagnosis present

## 2020-11-11 DIAGNOSIS — I129 Hypertensive chronic kidney disease with stage 1 through stage 4 chronic kidney disease, or unspecified chronic kidney disease: Secondary | ICD-10-CM | POA: Diagnosis present

## 2020-11-11 DIAGNOSIS — J9811 Atelectasis: Secondary | ICD-10-CM | POA: Diagnosis present

## 2020-11-11 DIAGNOSIS — Z8616 Personal history of COVID-19: Secondary | ICD-10-CM | POA: Diagnosis not present

## 2020-11-11 DIAGNOSIS — Z6820 Body mass index (BMI) 20.0-20.9, adult: Secondary | ICD-10-CM | POA: Diagnosis not present

## 2020-11-11 DIAGNOSIS — Z471 Aftercare following joint replacement surgery: Secondary | ICD-10-CM | POA: Diagnosis not present

## 2020-11-11 DIAGNOSIS — K5900 Constipation, unspecified: Secondary | ICD-10-CM | POA: Diagnosis present

## 2020-11-11 DIAGNOSIS — R443 Hallucinations, unspecified: Secondary | ICD-10-CM | POA: Diagnosis not present

## 2020-11-11 DIAGNOSIS — D649 Anemia, unspecified: Secondary | ICD-10-CM | POA: Diagnosis not present

## 2020-11-11 DIAGNOSIS — I440 Atrioventricular block, first degree: Secondary | ICD-10-CM | POA: Diagnosis present

## 2020-11-11 DIAGNOSIS — S72001A Fracture of unspecified part of neck of right femur, initial encounter for closed fracture: Secondary | ICD-10-CM

## 2020-11-11 DIAGNOSIS — N183 Chronic kidney disease, stage 3 unspecified: Secondary | ICD-10-CM | POA: Diagnosis present

## 2020-11-11 DIAGNOSIS — Z8546 Personal history of malignant neoplasm of prostate: Secondary | ICD-10-CM | POA: Diagnosis not present

## 2020-11-11 DIAGNOSIS — M25559 Pain in unspecified hip: Secondary | ICD-10-CM

## 2020-11-11 DIAGNOSIS — E43 Unspecified severe protein-calorie malnutrition: Secondary | ICD-10-CM | POA: Diagnosis not present

## 2020-11-11 DIAGNOSIS — G20 Parkinson's disease: Secondary | ICD-10-CM | POA: Diagnosis not present

## 2020-11-11 DIAGNOSIS — S72011A Unspecified intracapsular fracture of right femur, initial encounter for closed fracture: Secondary | ICD-10-CM | POA: Diagnosis not present

## 2020-11-11 DIAGNOSIS — I10 Essential (primary) hypertension: Secondary | ICD-10-CM

## 2020-11-11 DIAGNOSIS — W1830XA Fall on same level, unspecified, initial encounter: Secondary | ICD-10-CM | POA: Diagnosis present

## 2020-11-11 DIAGNOSIS — Z951 Presence of aortocoronary bypass graft: Secondary | ICD-10-CM | POA: Diagnosis not present

## 2020-11-11 DIAGNOSIS — R509 Fever, unspecified: Secondary | ICD-10-CM | POA: Diagnosis not present

## 2020-11-11 DIAGNOSIS — K449 Diaphragmatic hernia without obstruction or gangrene: Secondary | ICD-10-CM | POA: Diagnosis not present

## 2020-11-11 DIAGNOSIS — K2100 Gastro-esophageal reflux disease with esophagitis, without bleeding: Secondary | ICD-10-CM

## 2020-11-11 DIAGNOSIS — R059 Cough, unspecified: Secondary | ICD-10-CM | POA: Diagnosis not present

## 2020-11-11 DIAGNOSIS — K219 Gastro-esophageal reflux disease without esophagitis: Secondary | ICD-10-CM | POA: Diagnosis present

## 2020-11-11 DIAGNOSIS — J690 Pneumonitis due to inhalation of food and vomit: Secondary | ICD-10-CM | POA: Diagnosis not present

## 2020-11-11 DIAGNOSIS — D72829 Elevated white blood cell count, unspecified: Secondary | ICD-10-CM | POA: Diagnosis not present

## 2020-11-11 DIAGNOSIS — Z7982 Long term (current) use of aspirin: Secondary | ICD-10-CM | POA: Diagnosis not present

## 2020-11-11 DIAGNOSIS — Z8249 Family history of ischemic heart disease and other diseases of the circulatory system: Secondary | ICD-10-CM | POA: Diagnosis not present

## 2020-11-11 DIAGNOSIS — I739 Peripheral vascular disease, unspecified: Secondary | ICD-10-CM | POA: Diagnosis not present

## 2020-11-11 DIAGNOSIS — D62 Acute posthemorrhagic anemia: Secondary | ICD-10-CM | POA: Diagnosis not present

## 2020-11-11 DIAGNOSIS — G4730 Sleep apnea, unspecified: Secondary | ICD-10-CM | POA: Diagnosis not present

## 2020-11-11 DIAGNOSIS — I2510 Atherosclerotic heart disease of native coronary artery without angina pectoris: Secondary | ICD-10-CM | POA: Diagnosis not present

## 2020-11-11 DIAGNOSIS — N1832 Chronic kidney disease, stage 3b: Secondary | ICD-10-CM | POA: Diagnosis not present

## 2020-11-11 DIAGNOSIS — S72001D Fracture of unspecified part of neck of right femur, subsequent encounter for closed fracture with routine healing: Secondary | ICD-10-CM | POA: Diagnosis not present

## 2020-11-11 DIAGNOSIS — R296 Repeated falls: Secondary | ICD-10-CM | POA: Diagnosis not present

## 2020-11-11 DIAGNOSIS — E440 Moderate protein-calorie malnutrition: Secondary | ICD-10-CM | POA: Insufficient documentation

## 2020-11-11 DIAGNOSIS — W19XXXD Unspecified fall, subsequent encounter: Secondary | ICD-10-CM | POA: Diagnosis present

## 2020-11-11 DIAGNOSIS — I493 Ventricular premature depolarization: Secondary | ICD-10-CM | POA: Diagnosis not present

## 2020-11-11 DIAGNOSIS — J189 Pneumonia, unspecified organism: Secondary | ICD-10-CM

## 2020-11-11 DIAGNOSIS — Z8673 Personal history of transient ischemic attack (TIA), and cerebral infarction without residual deficits: Secondary | ICD-10-CM

## 2020-11-11 DIAGNOSIS — L570 Actinic keratosis: Secondary | ICD-10-CM | POA: Diagnosis not present

## 2020-11-11 DIAGNOSIS — G319 Degenerative disease of nervous system, unspecified: Secondary | ICD-10-CM | POA: Diagnosis not present

## 2020-11-11 DIAGNOSIS — M25551 Pain in right hip: Secondary | ICD-10-CM | POA: Diagnosis not present

## 2020-11-11 DIAGNOSIS — E872 Acidosis: Secondary | ICD-10-CM | POA: Diagnosis not present

## 2020-11-11 DIAGNOSIS — N179 Acute kidney failure, unspecified: Secondary | ICD-10-CM | POA: Diagnosis not present

## 2020-11-11 DIAGNOSIS — Z820 Family history of epilepsy and other diseases of the nervous system: Secondary | ICD-10-CM

## 2020-11-11 DIAGNOSIS — L89152 Pressure ulcer of sacral region, stage 2: Secondary | ICD-10-CM | POA: Diagnosis not present

## 2020-11-11 LAB — COMPREHENSIVE METABOLIC PANEL
ALT: 15 U/L (ref 0–44)
AST: 28 U/L (ref 15–41)
Albumin: 4.1 g/dL (ref 3.5–5.0)
Alkaline Phosphatase: 84 U/L (ref 38–126)
Anion gap: 11 (ref 5–15)
BUN: 19 mg/dL (ref 8–23)
CO2: 25 mmol/L (ref 22–32)
Calcium: 8.8 mg/dL — ABNORMAL LOW (ref 8.9–10.3)
Chloride: 102 mmol/L (ref 98–111)
Creatinine, Ser: 1.41 mg/dL — ABNORMAL HIGH (ref 0.61–1.24)
GFR, Estimated: 47 mL/min — ABNORMAL LOW (ref >60)
Glucose, Bld: 111 mg/dL — ABNORMAL HIGH (ref 70–99)
Potassium: 3.3 mmol/L — ABNORMAL LOW (ref 3.5–5.1)
Sodium: 138 mmol/L (ref 135–145)
Total Bilirubin: 0.7 mg/dL (ref 0.3–1.2)
Total Protein: 7.3 g/dL (ref 6.5–8.1)

## 2020-11-11 LAB — RESP PANEL BY RT-PCR (FLU A&B, COVID) ARPGX2
Influenza A by PCR: NEGATIVE
Influenza B by PCR: NEGATIVE
SARS Coronavirus 2 by RT PCR: POSITIVE — AB

## 2020-11-11 LAB — CBC WITH DIFFERENTIAL/PLATELET
Abs Immature Granulocytes: 0.06 10*3/uL (ref 0.00–0.07)
Basophils Absolute: 0.1 10*3/uL (ref 0.0–0.1)
Basophils Relative: 0 %
Eosinophils Absolute: 0 10*3/uL (ref 0.0–0.5)
Eosinophils Relative: 0 %
HCT: 34.7 % — ABNORMAL LOW (ref 39.0–52.0)
Hemoglobin: 11.1 g/dL — ABNORMAL LOW (ref 13.0–17.0)
Immature Granulocytes: 1 %
Lymphocytes Relative: 8 %
Lymphs Abs: 1 10*3/uL (ref 0.7–4.0)
MCH: 31.9 pg (ref 26.0–34.0)
MCHC: 32 g/dL (ref 30.0–36.0)
MCV: 99.7 fL (ref 80.0–100.0)
Monocytes Absolute: 0.7 10*3/uL (ref 0.1–1.0)
Monocytes Relative: 6 %
Neutro Abs: 10.5 10*3/uL — ABNORMAL HIGH (ref 1.7–7.7)
Neutrophils Relative %: 85 %
Platelets: 389 10*3/uL (ref 150–400)
RBC: 3.48 MIL/uL — ABNORMAL LOW (ref 4.22–5.81)
RDW: 16.1 % — ABNORMAL HIGH (ref 11.5–15.5)
WBC: 12.4 10*3/uL — ABNORMAL HIGH (ref 4.0–10.5)
nRBC: 0 % (ref 0.0–0.2)

## 2020-11-11 LAB — URINALYSIS, ROUTINE W REFLEX MICROSCOPIC
Bilirubin Urine: NEGATIVE
Glucose, UA: NEGATIVE mg/dL
Hgb urine dipstick: NEGATIVE
Ketones, ur: 5 mg/dL — AB
Leukocytes,Ua: NEGATIVE
Nitrite: NEGATIVE
Protein, ur: NEGATIVE mg/dL
Specific Gravity, Urine: 1.011 (ref 1.005–1.030)
pH: 5 (ref 5.0–8.0)

## 2020-11-11 MED ORDER — ACETAMINOPHEN 325 MG PO TABS
650.0000 mg | ORAL_TABLET | Freq: Once | ORAL | Status: AC
  Administered 2020-11-11: 650 mg via ORAL
  Filled 2020-11-11: qty 2

## 2020-11-11 MED ORDER — PANTOPRAZOLE SODIUM 40 MG PO TBEC
40.0000 mg | DELAYED_RELEASE_TABLET | Freq: Two times a day (BID) | ORAL | Status: DC
  Administered 2020-11-12 – 2020-11-17 (×10): 40 mg via ORAL
  Filled 2020-11-11 (×10): qty 1

## 2020-11-11 MED ORDER — ONDANSETRON HCL 4 MG/2ML IJ SOLN: 4.0000 mg | Freq: Four times a day (QID) | INTRAMUSCULAR | Status: DC | PRN

## 2020-11-11 MED ORDER — CARBIDOPA-LEVODOPA 25-100 MG PO TABS
1.5000 | ORAL_TABLET | Freq: Three times a day (TID) | ORAL | Status: DC
  Administered 2020-11-12 – 2020-11-17 (×15): 1.5 via ORAL
  Filled 2020-11-11 (×15): qty 2

## 2020-11-11 MED ORDER — FLUTICASONE PROPIONATE 50 MCG/ACT NA SUSP
2.0000 | Freq: Every day | NASAL | Status: DC
  Administered 2020-11-13 – 2020-11-16 (×3): 2 via NASAL
  Filled 2020-11-11: qty 16

## 2020-11-11 MED ORDER — ONDANSETRON HCL 4 MG PO TABS: 4.0000 mg | ORAL_TABLET | Freq: Four times a day (QID) | ORAL | Status: DC | PRN

## 2020-11-11 MED ORDER — HYDROCODONE-ACETAMINOPHEN 5-325 MG PO TABS
1.0000 | ORAL_TABLET | ORAL | Status: DC | PRN
  Administered 2020-11-11: 1 via ORAL
  Filled 2020-11-11: qty 1

## 2020-11-11 NOTE — H&P (Signed)
History and Physical  Jon Reid [id number] DOB: July 02, 1928 DOA: 11/11/2020  Referring physician: Coral Ceo, PA-C, EDP PCP: Claretta Fraise, MD  Outpatient Specialists:   Patient Coming From: Home  Chief Complaint: right hip pain  HPI: Jon Reid is a 87 y.o. male with a history of stage III chronic kidney disease, coronary artery disease with history of first-degree block, GERD, hypertension, history of prostate cancer.  Patient presents after a fall just prior to presentation.  Patient landed on his right hip and had immediate pain.  Pain increased with movement and improved with rest.  No other palliating or provoking factors.  He was brought to the emergency department for evaluation.  His pain is stable.  He denies chest pain, shortness of breath, wheezing, abdominal pain.  He did not hit his head  Emergency Department Course: Head CT negative, hip x-ray shows fracture of the right hip.  White count 12.  Review of Systems:   Pt denies any fevers, chills, nausea, vomiting, diarrhea, constipation, abdominal pain, shortness of breath, dyspnea on exertion, orthopnea, cough, wheezing, palpitations, headache, vision changes, lightheadedness, dizziness, melena, rectal bleeding.  Review of systems are otherwise negative  Past Medical History:  Diagnosis Date   Arthritis    Carcinoma of prostate ([place])    prostate   CKD (chronic kidney disease), stage III ([place])    Coronary artery disease    a. 10/2012 Cath: LM 20-30d, lAD 80p, 25m 80-90d, LCX 90-911mOM1 90, RCA 50p, 2050m0d, RPDA 90, EF nl; b. 10/2012 CABG x 4 (LIMA-LAD, SVG-OM1, SVG-PDA->RPL); c. 07/2015 MV: Basal and proximal septal infarct w/o ischemia. EF 57%-->low risk.   First degree AV block 01/28/2019   Noted on EKG   GERD (gastroesophageal reflux disease)    Gout    HTN (hypertension)    Macrocytosis without anemia 01/08/2012   Moderate aortic stenosis    a. 10/2012 Echo: EF 55-60%, no rwma, BAE, mild AI, mild  TR; b. 06/2020 Echo: EF 50-55%, no rwma, mild LVH. Mod dil LA. Mild MR. Mod AS.   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   Sleep apnea    Questionable   Spinal stenosis    [facility]  Past Surgical History:  Procedure Laterality Date   BALLOON DILATION N/A 10/03/2020   Procedure: BALLOON DILATION;  Surgeon: CarEloise HarmanO;  [facility];  Service: Endoscopy;  Laterality: N/A;   CARDIAC CATHETERIZATION  11/07/2012   Dr NahAcie FredricksonCATARACT EXTRACTION W/ INTRAOCULAR LENS IMPLANT Bilateral    CORONARY ARTERY BYPASS GRAFT N/A 11/11/2012   Procedure: CORONARY ARTERY BYPASS GRAFTING times four using Right Greater Saphenous Vein Graft harvested endoscopically and Left Internal Mammary Artery.;  Surgeon: PetIvin PootD;  [facility];  Laterality: N/A;   ESOPHAGEAL BRUSHING  10/03/2020   Procedure: ESOPHAGEAL BRUSHING;  Surgeon: CarEloise HarmanO;  [facility];  Service: Endoscopy;;   ESOPHAGOGASTRODUODENOSCOPY (EGD) WITH PROPOFOL N/A 06/27/2020   Surgeon: CarEloise HarmanO;  large hiatal hernia, LA grade D esophagitis with no bleeding, normal examined duodenum.    ESOPHAGOGASTRODUODENOSCOPY (EGD) WITH PROPOFOL N/A 10/03/2020   Procedure: ESOPHAGOGASTRODUODENOSCOPY (EGD) WITH PROPOFOL;  Surgeon: CarEloise HarmanO;  [facility];  Service: Endoscopy;  Laterality: N/A;  2:00pm   INGUINAL HERNIA REPAIR Right 01/10/2017  Procedure: OPEN RIGHT HERNIA REPAIR INGUINAL;  Surgeon: Ileana Roup, MD;  Location: [facility];  Service: General;  Laterality: Right;   INSERTION OF MESH Right 01/10/2017   Procedure: INSERTION OF MESH;  Surgeon: Ileana Roup, MD;  Location: [facility];  Service: General;  Laterality: Right;   INTRAOPERATIVE TRANSESOPHAGEAL ECHOCARDIOGRAM N/A 11/11/2012   Procedure: INTRAOPERATIVE TRANSESOPHAGEAL ECHOCARDIOGRAM;   Surgeon: Ivin Poot, MD;  Location: [facility];  Service: Open Heart Surgery;  Laterality: N/A;   LEFT HEART CATHETERIZATION WITH CORONARY ANGIOGRAM N/A 11/07/2012   Procedure: LEFT HEART CATHETERIZATION WITH CORONARY ANGIOGRAM;  Surgeon: Thayer Headings, MD;  Location: [facility] CATH LAB;  Service: Cardiovascular;  Laterality: N/A;   LUMBAR LAMINECTOMY/DECOMPRESSION MICRODISCECTOMY N/A 02/05/2019   Procedure: LUMBAR LAMINECTOMY/DECOMPRESSION L3-L4;  Surgeon: Latanya Maudlin, MD;  Location: [facility];  Service: Orthopedics;  Laterality: N/A;  36mn   LYMPH NODE DISSECTION     Bilateral pelvic   RETROPUBIC PROSTATECTOMY     Radical   Social History:  reports that he has never smoked. He has never used smokeless tobacco. He reports current alcohol use. He reports that he does not use drugs. Patient lives at home  Allergies  Allergen Reactions   Crestor [Rosuvastatin] Swelling    GYNECOMASTIA     Family History  Problem Relation Age of Onset   Aneurysm Father        Cerebral   Stroke Father    Hypertension Father    Cancer Mother        colon   Parkinson's disease Son       Prior to Admission medications   Medication Sig Start Date End Date Taking? Authorizing Provider  ascorbic acid (VITAMIN C) 500 MG tablet Take 1 tablet (500 mg total) by mouth daily. 06/30/20   MBarton Dubois MD  aspirin EC 81 MG tablet Take 81 mg by mouth daily.    [provider]  carbidopa-levodopa (SINEMET IR) 25-100 MG tablet Take 1.5 tablets by mouth 3 (three) times daily. 09/21/20   WKathrynn Ducking MD  cephALEXin (KEFLEX) 500 MG capsule Take 1 capsule (500 mg total) by mouth 2 (two) times daily. 10/31/20   MGwenlyn Perking FNP  Cholecalciferol (VITAMIN D) 2000 units CAPS Take 2,000 Units by mouth daily.    [provider]  fluticasone (FLONASE) 50 MCG/ACT nasal spray One to 2 sprays each nostril at bedtime Patient taking differently: Place 2 sprays into both nostrils at bedtime. 10/12/14   MChipper Herb MD  furosemide (LASIX) 40 MG tablet TAKE 1 TABLET DAILY 10/10/20   SClaretta Fraise MD  Iron, Ferrous Sulfate, 325 (65 Fe) MG TABS Take 1 tablet by mouth daily. Patient taking differently: Take 325 mg by mouth daily. 06/29/20   MBarton Dubois MD  nitroGLYCERIN (NITROSTAT) 0.4 MG SL tablet Place 1 tablet (0.4 mg total) under the tongue every 5 (five) minutes as needed for chest pain. Patient not taking: Reported on 11/08/2020 07/20/15   KErlene Quan PA-C  pantoprazole (PROTONIX) 40 MG tablet Take 1 tablet (40 mg total) by mouth 2 (two) times daily. TAKE 1 TABLET ONCE DAILY FOR REFLUX Patient taking differently: Take 40 mg by mouth 2 (two) times daily. 06/29/20   MBarton Dubois MD  triamcinolone cream (KENALOG) 0.1 % Apply 1 application topically daily as needed (itching). 06/29/20   MBarton Dubois MD  zinc sulfate 220 (50 Zn) MG capsule TAKE ONE CAPSULE ONCE DAILY Patient taking differently: Take 220 mg by mouth daily.  08/23/20   Claretta Fraise, MD    Physical Exam: BP 119/68   Pulse 81   Temp 98.2 F (36.8 C) (Oral)   Resp 20   SpO2 100%   General: Elderly male. Awake and alert and oriented x3. No acute cardiopulmonary distress.  HEENT: Normocephalic atraumatic.  Right and left ears normal in appearance.  Pupils equal, round, reactive to light. Extraocular muscles are intact. Sclerae anicteric and noninjected.  Moist mucosal membranes. No mucosal lesions.  Neck: Neck supple without lymphadenopathy. No carotid bruits. No masses palpated.  Cardiovascular: Regular rate with normal S1-S2 sounds. No murmurs, rubs, gallops auscultated. No JVD.  Respiratory: Good respiratory effort with no wheezes, rales, rhonchi. Lungs clear to auscultation bilaterally.  No accessory muscle use. Abdomen: Soft, nontender, nondistended. Active bowel sounds. No masses or hepatosplenomegaly  Skin: No rashes, lesions, or ulcerations.  Dry, warm to touch. 2+ dorsalis pedis and radial pulses. Musculoskeletal:  Right leg shortened and externally rotated.  Neurovascularly intact distal.  No upper joint deformation.  Good ROM.  No contractures  Psychiatric: Intact judgment and insight. Pleasant and cooperative. Neurologic: No focal neurological deficits. Strength is 5/5 and symmetric in upper and lower extremities.  Cranial nerves II through XII are grossly intact.           Labs on Admission: I have personally reviewed following labs and imaging studies  CBC: Recent Labs  Lab 11/08/20 1553 11/11/20 1917  WBC 7.4 12.4*  NEUTROABS 4.0 10.5*  HGB 9.7* 11.1*  HCT 29.7* 34.7*  MCV 94 99.7  PLT 408 [id number]   Basic Metabolic Panel: Recent Labs  Lab 11/08/20 1553 11/11/20 1917  NA 142 138  K 3.6 3.3*  CL 104 102  CO2 24 25  GLUCOSE 120* 111*  BUN 17 19  CREATININE 1.46* 1.41*  CALCIUM 8.9 8.8*   GFR: Estimated Creatinine Clearance: 33.5 mL/min (A) (by C-G formula based on SCr of 1.41 mg/dL (H)). Liver Function Tests: Recent Labs  Lab 11/11/20 1917  AST 28  ALT 15  ALKPHOS 84  BILITOT 0.7  PROT 7.3  ALBUMIN 4.1   No results for input(s): LIPASE, AMYLASE in the last 168 hours. No results for input(s): AMMONIA in the last 168 hours. Coagulation Profile: No results for input(s): INR, PROTIME in the last 168 hours. Cardiac Enzymes: No results for input(s): CKTOTAL, CKMB, CKMBINDEX, TROPONINI in the last 168 hours. BNP (last 3 results) No results for input(s): PROBNP in the last 8760 hours. HbA1C: No results for input(s): HGBA1C in the last 72 hours. CBG: No results for input(s): GLUCAP in the last 168 hours. Lipid Profile: No results for input(s): CHOL, HDL, LDLCALC, TRIG, CHOLHDL, LDLDIRECT in the last 72 hours. Thyroid Function Tests: No results for input(s): TSH, T4TOTAL, FREET4, T3FREE, THYROIDAB in the last 72 hours. Anemia Panel: No results for input(s): VITAMINB12, FOLATE, FERRITIN, TIBC, IRON, RETICCTPCT in the last 72 hours. Urine analysis:    Component Value  Date/Time   COLORURINE YELLOW 11/11/2020 1933   APPEARANCEUR CLEAR 11/11/2020 1933   LABSPEC 1.011 11/11/2020 1933   PHURINE 5.0 11/11/2020 1933   GLUCOSEU NEGATIVE 11/11/2020 1933   HGBUR NEGATIVE 11/11/2020 1933   BILIRUBINUR NEGATIVE 11/11/2020 1933   BILIRUBINUR neg 07/07/2012 1455   KETONESUR 5 (A) 11/11/2020 1933   PROTEINUR NEGATIVE 11/11/2020 1933   UROBILINOGEN 0.2 11/08/2012 1927   NITRITE NEGATIVE 11/11/2020 1933   LEUKOCYTESUR NEGATIVE 11/11/2020 1933   Sepsis Labs: '@LABRCNTIP'$ (procalcitonin:4,lacticidven:4) ) Recent Results (from the past 240 hour(s))  Resp Panel by RT-PCR (Flu A&B, Covid)     Status: Abnormal   Collection Time: 11/11/20  8:14 PM   Specimen: Nasopharyngeal(NP) swabs in vial transport medium  Result Value Ref Range Status   SARS Coronavirus 2 by RT PCR POSITIVE (A) NEGATIVE Final    Comment: CRITICAL RESULT CALLED TO, READ BACK BY AND VERIFIED WITH: TURNER,C 2154 11/11/2020 COLEMAN,R (NOTE) SARS-CoV-2 target nucleic acids are DETECTED.  The SARS-CoV-2 RNA is generally detectable in upper respiratory specimens during the acute phase of infection. Positive results are indicative of the presence of the identified virus, but do not rule out bacterial infection or co-infection with other pathogens not detected by the test. Clinical correlation with patient history and other diagnostic information is necessary to determine patient infection status. The expected result is Negative.  Fact Sheet for Patients: EntrepreneurPulse.com.au  Fact Sheet for Healthcare Providers: IncredibleEmployment.be  This test is not yet approved or cleared by the [place] FDA and  has been authorized for detection and/or diagnosis of SARS-CoV-2 by FDA under an Emergency Use Authorization (EUA).  This EUA will remain in effect (meaning this t est can be used) for the duration of  the COVID-19 declaration under Section 564(b)(1) of  the Act, 21 U.S.C. section 360bbb-3(b)(1), unless the authorization is terminated or revoked sooner.     Influenza A by PCR NEGATIVE NEGATIVE Final   Influenza B by PCR NEGATIVE NEGATIVE Final    Comment: (NOTE) The Xpert Xpress SARS-CoV-2/FLU/RSV plus assay is intended as an aid in the diagnosis of influenza from Nasopharyngeal swab specimens and should not be used as a sole basis for treatment. Nasal washings and aspirates are unacceptable for Xpert Xpress SARS-CoV-2/FLU/RSV testing.  Fact Sheet for Patients: EntrepreneurPulse.com.au  Fact Sheet for Healthcare Providers: IncredibleEmployment.be  This test is not yet approved or cleared by the [place] FDA and has been authorized for detection and/or diagnosis of SARS-CoV-2 by FDA under an Emergency Use Authorization (EUA). This EUA will remain in effect (meaning this test can be used) for the duration of the COVID-19 declaration under Section 564(b)(1) of the Act, 21 U.S.C. section 360bbb-3(b)(1), unless the authorization is terminated or revoked.  Performed at [facility], [address] [place], [place] [address]      Radiological Exams on Admission: CT HEAD WO CONTRAST (5MM)  Result Date: 11/11/2020 CLINICAL DATA:  Fall. EXAM: CT HEAD WITHOUT CONTRAST TECHNIQUE: Contiguous axial images were obtained from the base of the skull through the vertex without intravenous contrast. COMPARISON:  MRI brain dated November 02, 2019. FINDINGS: Brain: No evidence of acute infarction, hemorrhage, hydrocephalus, extra-axial collection or mass lesion/mass effect. Stable moderate atrophy and mild chronic microvascular ischemic changes. Vascular: Atherosclerotic vascular calcification of the carotid siphons. No hyperdense vessel. Skull: Normal. Negative for fracture or focal lesion. Sinuses/Orbits: No acute finding. Other: None. IMPRESSION: 1. No acute intracranial abnormality. 2. Stable moderate atrophy  and mild chronic microvascular ischemic changes. Electronically Signed   By: Titus Dubin M.D.   On: 11/11/2020 19:47   DG Hip Unilat W or Wo Pelvis 2-3 Views Right  Result Date: 11/11/2020 CLINICAL DATA:  Fall, right hip pain EXAM: DG HIP (WITH OR WITHOUT PELVIS) 2-3V RIGHT COMPARISON:  None. FINDINGS: Subcapital right hip fracture with varus angulation and foreshortening. Visualized bony pelvis appears intact. Old left inferior pubic ramus fracture. Left hip joint space is preserved. IMPRESSION: Subcapital right hip fracture, as above. Electronically Signed   By: Julian Hy M.D.   On:  11/11/2020 20:08    EKG: pending  Assessment/Plan: Active Problems:   Benign essential hypertension   Peripheral vascular disease (HCC)   Chronic renal insufficiency, stage III (moderate) (HCC)   Coronary artery disease involving native coronary artery of native heart without angina pectoris   Gastroesophageal reflux disease with esophagitis   Femur fracture, right ([place])    This patient was discussed with the ED physician, including pertinent vitals, physical exam findings, labs, and imaging.  We also discussed care given by the ED provider.  Right femur fracture Vicodin for pain control N.p.o. after midnight Potential surgery tomorrow Hold anticoagulants  Hypertension Continue antihypertensives Peripheral vascular disease  Chronic renal insufficiency stage III Watch for nephrotoxic drugs Coronary artery disease Restart aspirin as soon as possible after surgery GERD On PPI  DVT prophylaxis: SCDs Consultants: Ortho Code Status: Full code Family Communication: Son present during interview and exam Disposition Plan: Pending   Truett Mainland, DO

## 2020-11-11 NOTE — ED Provider Notes (Signed)
[facility] EMERGENCY DEPARTMENT Provider Note   CSN: [id number] Arrival date & time: 11/11/20  1707     History Chief Complaint  Patient presents with   Jon Reid    SAHR CHAPNICK is a 87 y.o. male.  Past medical history of prostate cancer, hypertension, peripheral vascular disease, chronic renal imposition C stage III, hyperlipidemia, coronary artery disease with history of CABG, GERD, degree AV block, moderate aortic stenosis, sleep apnea, gout, history of CVA, recent history of multiple falls  Patient presents to the ED today after a fall.  States that he was standing in his kitchen and talking to his son when he got really weak and he fell.  He denies hitting his head.  He states the only pain he has is right hip pain.  He has weakness to his right leg.  Denies numbness or tingling in his right leg.  He does not take any blood thinners but he does take daily aspirin.  He states that he is being worked up for possible arrhythmia and has Holter monitor.  However he was not wearing his Holter monitor today when he fell.  He states that he has had multiple falls recently although similar circumstances.  He has not had any serious injuries.     Fall Pertinent negatives include no chest pain, no abdominal pain, no headaches and no shortness of breath.      Past Medical History:  Diagnosis Date   Arthritis    Carcinoma of prostate ([place])    prostate   CKD (chronic kidney disease), stage III (HCC)    Coronary artery disease    a. 10/2012 Cath: LM 20-30d, lAD 80p, 30m 80-90d, LCX 90-947mOM1 90, RCA 50p, 2034m0d, RPDA 90, EF nl; b. 10/2012 CABG x 4 (LIMA-LAD, SVG-OM1, SVG-PDA->RPL); c. 07/2015 MV: Basal and proximal septal infarct w/o ischemia. EF 57%-->low risk.   First degree AV block 01/28/2019   Noted on EKG   GERD (gastroesophageal reflux disease)    Gout    HTN (hypertension)    Macrocytosis without anemia 01/08/2012   Moderate aortic stenosis    a. 10/2012 Echo: EF 55-60%, no  rwma, BAE, mild AI, mild TR; b. 06/2020 Echo: EF 50-55%, no rwma, mild LVH. Mod dil LA. Mild MR. Mod AS.   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   Sleep apnea    Questionable   Spinal stenosis    [facility]   Patient Active Problem List   Diagnosis Date Noted   Femur fracture, right ([place]8/26/2022   Frail elderly 11/08/2020   Dizziness 09/23/2020   History of GI bleed 08/19/2020   Loss of weight 08/19/2020   Dysphagia 08/19/2020   Upper GI bleed 06/25/2020   COVID-19 virus infection 06/25/2020   [name] affect 02/09/2020   Iron deficiency anemia due to chronic blood loss 05/27/2019   Nonrheumatic mitral valve regurgitation 05/26/2019   Left anterior fascicular block 05/26/2019   Educated about COVID-19 virus infection 04/06/2019   Constipation 02/19/2019   Elevated troponin 02/09/2019   Fecal occult blood test positive 02/09/2019   Spinal stenosis, lumbar region with neurogenic claudication 02/05/2019   Leg swelling 01/26/2019   Nonrheumatic aortic valve stenosis 01/26/2019   Multiple joint pain 12/11/2018   Chronic pain of both knees 11/07/2018   Vitamin D deficiency 10/23/2018   Gastroesophageal reflux disease with esophagitis 03/13/2018  Murmur, cardiac 12/04/2017   Coronary artery disease involving native coronary artery of native heart without angina pectoris 12/04/2017   PVC's (premature ventricular contractions) 12/04/2017   Heart disease 08/21/2017   Weakness 11/14/2016   Abnormality of gait 12/06/2015   Abdominal aortic atherosclerosis ([place]) 12/01/2015   Degeneration of lumbar intervertebral disc 12/01/2015   Exertional angina ([place]) 07/20/2015   Hernia of abdominal wall 10/12/2014   Gynecomastia 04/09/2013   Hx of CABG 02/02/2013   Myelodysplastic syndrome 11/06/2012   Vitamin B12 deficiency 07/07/2012   Hyperlipemia 07/07/2012   Peripheral vascular disease ([place])  04/14/2009   CARCINOMA, PROSTATE 04/13/2009   Non-thrombocytopenic purpura ([place]) 04/13/2009   Benign essential hypertension 04/13/2009   Chronic renal insufficiency, stage III (moderate) ([place]) 04/13/2009   SLEEP APNEA 04/13/2009    Past Surgical History:  Procedure Laterality Date   BALLOON DILATION N/A 10/03/2020   Procedure: BALLOON DILATION;  Surgeon: [name], DO;  [facility];  Service: Endoscopy;  Laterality: N/A;   CARDIAC CATHETERIZATION  11/07/2012   Dr Acie Fredrickson   CATARACT EXTRACTION W/ INTRAOCULAR LENS IMPLANT Bilateral    CORONARY ARTERY BYPASS GRAFT N/A 11/11/2012   Procedure: CORONARY ARTERY BYPASS GRAFTING times four using Right Greater Saphenous Vein Graft harvested endoscopically and Left Internal Mammary Artery.;  Surgeon: Ivin Poot, MD;  Location: [place];  Service: Open Heart Surgery;  Laterality: N/A;   ESOPHAGEAL BRUSHING  10/03/2020   Procedure: ESOPHAGEAL BRUSHING;  Surgeon: [name], DO;  [facility];  Service: Endoscopy;;   ESOPHAGOGASTRODUODENOSCOPY (EGD) WITH PROPOFOL N/A 06/27/2020   Surgeon: [name], DO;  large hiatal hernia, LA grade D esophagitis with no bleeding, normal examined duodenum.    ESOPHAGOGASTRODUODENOSCOPY (EGD) WITH PROPOFOL N/A 10/03/2020   Procedure: ESOPHAGOGASTRODUODENOSCOPY (EGD) WITH PROPOFOL;  Surgeon: [name], DO;  [facility];  Service: Endoscopy;  Laterality: N/A;  2:00pm   INGUINAL HERNIA REPAIR Right 01/10/2017   Procedure: OPEN RIGHT HERNIA REPAIR INGUINAL;  Surgeon: Ileana Roup, MD;  [facility]: [facility];  Service: General;  Laterality: Right;   INSERTION OF MESH Right 01/10/2017   Procedure: INSERTION OF MESH;  Surgeon: Ileana Roup, MD;  [facility];  Service: General;  Laterality: Right;   INTRAOPERATIVE TRANSESOPHAGEAL ECHOCARDIOGRAM N/A 11/11/2012   Procedure: INTRAOPERATIVE TRANSESOPHAGEAL ECHOCARDIOGRAM;  Surgeon: Ivin Poot, MD;   Location: [place];  Service: Open Heart Surgery;  Laterality: N/A;   LEFT HEART CATHETERIZATION WITH CORONARY ANGIOGRAM N/A 11/07/2012   Procedure: LEFT HEART CATHETERIZATION WITH CORONARY ANGIOGRAM;  Surgeon: Thayer Headings, MD;  Location[facility] C[facility] LAB;  Service: Cardiovascular;  Laterality: N/A;   LUMBAR LAMINECTOMY/DECOMPRESSION MICRODISCECTOMY N/A 02/05/2019   Procedure: LUMBAR LAMINECTOMY/DECOMPRESSION L3-L4;  Surgeon: Latanya Maudlin, MD;  [facility];  Service: Orthopedics;  Laterality: N/A;  51mn   LYMPH NODE DISSECTION     Bilateral pelvic   RETROPUBIC PROSTATECTOMY     Radical       Family History  Problem Relation Age of Onset   Aneurysm Father        Cerebral   Stroke Father    Hypertension Father    Cancer Mother        colon   Parkinson's disease Son     Social History   Tobacco Use   Smoking status: Never   Smokeless tobacco: Never  Vaping Use   Vaping Use: Never used  Substance Use Topics   Alcohol use: Yes  Comment: Rare   Drug use: No    Home Medications Prior to Admission medications   Medication Sig Start Date End Date Taking? Authorizing Provider  ascorbic acid (VITAMIN C) 500 MG tablet Take 1 tablet (500 mg total) by mouth daily. 06/30/20   Barton Dubois, MD  aspirin EC 81 MG tablet Take 81 mg by mouth daily.    [provider]  carbidopa-levodopa (SINEMET IR) 25-100 MG tablet Take 1.5 tablets by mouth 3 (three) times daily. 09/21/20   Kathrynn Ducking, MD  cephALEXin (KEFLEX) 500 MG capsule Take 1 capsule (500 mg total) by mouth 2 (two) times daily. 10/31/20   Gwenlyn Perking, FNP  Cholecalciferol (VITAMIN D) 2000 units CAPS Take 2,000 Units by mouth daily.    [provider]  fluticasone (FLONASE) 50 MCG/ACT nasal spray One to 2 sprays each nostril at bedtime Patient taking differently: Place 2 sprays into both nostrils at bedtime. 10/12/14   Chipper Herb, MD  furosemide (LASIX) 40 MG tablet TAKE 1 TABLET DAILY 10/10/20    Claretta Fraise, MD  Iron, Ferrous Sulfate, 325 (65 Fe) MG TABS Take 1 tablet by mouth daily. Patient taking differently: Take 325 mg by mouth daily. 06/29/20   Barton Dubois, MD  nitroGLYCERIN (NITROSTAT) 0.4 MG SL tablet Place 1 tablet (0.4 mg total) under the tongue every 5 (five) minutes as needed for chest pain. Patient not taking: Reported on 11/08/2020 07/20/15   Erlene Quan, PA-C  pantoprazole (PROTONIX) 40 MG tablet Take 1 tablet (40 mg total) by mouth 2 (two) times daily. TAKE 1 TABLET ONCE DAILY FOR REFLUX Patient taking differently: Take 40 mg by mouth 2 (two) times daily. 06/29/20   Barton Dubois, MD  triamcinolone cream (KENALOG) 0.1 % Apply 1 application topically daily as needed (itching). 06/29/20   Barton Dubois, MD  zinc sulfate 220 (50 Zn) MG capsule TAKE ONE CAPSULE ONCE DAILY Patient taking differently: Take 220 mg by mouth daily. 08/23/20   Claretta Fraise, MD    Allergies    Crestor [rosuvastatin]  Review of Systems   Review of Systems  Constitutional:  Negative for chills, fatigue and fever.  Eyes:  Negative for visual disturbance.  Respiratory:  Negative for cough, chest tightness, shortness of breath and wheezing.   Cardiovascular:  Negative for chest pain, palpitations and leg swelling.  Gastrointestinal:  Negative for abdominal distention, abdominal pain, constipation, diarrhea, nausea and vomiting.  Musculoskeletal:  Positive for arthralgias and gait problem.       Right hip pain  Skin:  Negative for rash and wound.  Neurological:  Positive for weakness. Negative for dizziness, syncope, light-headedness, numbness and headaches.  All other systems reviewed and are negative.  Physical Exam Updated Vital Signs BP 119/68   Pulse 81   Temp 98.2 F (36.8 C) (Oral)   Resp 20   SpO2 100%   Physical Exam Vitals and nursing note reviewed.  Constitutional:      General: He is not in acute distress.    Appearance: Normal appearance. He is not ill-appearing,  toxic-appearing or diaphoretic.     Comments: frail  HENT:     Head: Normocephalic and atraumatic.  Eyes:     General: No scleral icterus.       Right eye: No discharge.        Left eye: No discharge.     Extraocular Movements: Extraocular movements intact.     Conjunctiva/sclera: Conjunctivae normal.     Pupils: Pupils are  equal, round, and reactive to light.  Cardiovascular:     Rate and Rhythm: Normal rate and regular rhythm.     Pulses: Normal pulses.     Heart sounds: Normal heart sounds. No murmur heard.   No friction rub. No gallop.  Pulmonary:     Effort: Pulmonary effort is normal. No respiratory distress.     Breath sounds: Normal breath sounds. No wheezing, rhonchi or rales.  Abdominal:     General: Abdomen is flat. Bowel sounds are normal. There is no distension.     Tenderness: There is no abdominal tenderness. There is no guarding or rebound.  Musculoskeletal:        General: Tenderness present. No swelling or deformity.     Right lower leg: No edema.     Left lower leg: No edema.     Comments: No swelling noted to right hip. Pain is localized to anterior aspect of right hip. Pulses intact. Sensation intact.   Skin:    General: Skin is warm and dry.     Findings: No bruising, erythema or lesion.  Neurological:     General: No focal deficit present.     Mental Status: He is alert. Mental status is at baseline.     Cranial Nerves: No cranial nerve deficit.  Psychiatric:        Mood and Affect: Mood normal.        Behavior: Behavior normal.    ED Results / Procedures / Treatments   Labs (all labs ordered are listed, but only abnormal results are displayed) Labs Reviewed  RESP PANEL BY RT-PCR (FLU A&B, COVID) ARPGX2 - Abnormal; Notable for the following components:      Result Value   SARS Coronavirus 2 by RT PCR POSITIVE (*)    All other components within normal limits  COMPREHENSIVE METABOLIC PANEL - Abnormal; Notable for the following components:    Potassium 3.3 (*)    Glucose, Bld 111 (*)    Creatinine, Ser 1.41 (*)    Calcium 8.8 (*)    GFR, Estimated 47 (*)    All other components within normal limits  CBC WITH DIFFERENTIAL/PLATELET - Abnormal; Notable for the following components:   WBC 12.4 (*)    RBC 3.48 (*)    Hemoglobin 11.1 (*)    HCT 34.7 (*)    RDW 16.1 (*)    Neutro Abs 10.5 (*)    All other components within normal limits  URINALYSIS, ROUTINE W REFLEX MICROSCOPIC - Abnormal; Notable for the following components:   Ketones, ur 5 (*)    All other components within normal limits    EKG None  Radiology CT HEAD WO CONTRAST (5MM)  Result Date: 11/11/2020 CLINICAL DATA:  Fall. EXAM: CT HEAD WITHOUT CONTRAST TECHNIQUE: Contiguous axial images were obtained from the base of the skull through the vertex without intravenous contrast. COMPARISON:  MRI brain dated November 02, 2019. FINDINGS: Brain: No evidence of acute infarction, hemorrhage, hydrocephalus, extra-axial collection or mass lesion/mass effect. Stable moderate atrophy and mild chronic microvascular ischemic changes. Vascular: Atherosclerotic vascular calcification of the carotid siphons. No hyperdense vessel. Skull: Normal. Negative for fracture or focal lesion. Sinuses/Orbits: No acute finding. Other: None. IMPRESSION: 1. No acute intracranial abnormality. 2. Stable moderate atrophy and mild chronic microvascular ischemic changes. Electronically Signed   By: Titus Dubin M.D.   On: 11/11/2020 19:47   DG Hip Unilat W or Wo Pelvis 2-3 Views Right  Result Date: 11/11/2020 CLINICAL DATA:  Fall, right hip pain EXAM: DG HIP (WITH OR WITHOUT PELVIS) 2-3V RIGHT COMPARISON:  None. FINDINGS: Subcapital right hip fracture with varus angulation and foreshortening. Visualized bony pelvis appears intact. Old left inferior pubic ramus fracture. Left hip joint space is preserved. IMPRESSION: Subcapital right hip fracture, as above. Electronically Signed   By: Julian Hy  M.D.   On: 11/11/2020 20:08    Procedures Procedures   Medications Ordered in ED Medications  acetaminophen (TYLENOL) tablet 650 mg (650 mg Oral Given 11/11/20 1923)    ED Course  I have reviewed the triage vital signs and the nursing notes.  Pertinent labs & imaging results that were available during my care of the patient were reviewed by me and considered in my medical decision making (see chart for details).    MDM Rules/Calculators/A&P                          This is a frail 87 year old male who presents status post mechanical fall on admission his vital signs are stable.  He is only complaining of right pelvic pain.  Labs were obtained for possible weakness prior to the fall.  He is not having any major electrolyte derangements.  He has a mild leukocytosis.  He is mildly anemic.  His urine is not suggestive of a urinary tract infection.  We obtained a CT head due to unsure if patient hit his head.  It is negative.  X-ray of the right hip is notable for subcapital right hip fracture.    Spoke with orthopedic physician, Aline Brochure at 2059, and plan will be for him to undergo surgery tomorrow.  Spoke with Dr. Nehemiah Settle at 2115 from hospitalist team and will admit patient to service.  Discussed with patient the need for hospitalization and further care. Patient is understanding and willing to be admitted to hospital.      Final Clinical Impression(s) / ED Diagnoses Final diagnoses:  Hip pain  Closed right hip fracture, initial encounter [facility])    Rx / DC Orders ED Discharge Orders     None        Sheila Oats 11/11/20 2212    Truddie Hidden, MD 11/11/20 [phone]

## 2020-11-11 NOTE — Progress Notes (Signed)
Patient ID: Jon Reid, male   DOB: 11/12/1928, 87 y.o.   MRN: [id number]  87 yo male multiple issues and recently getting worse see PMD and cardiology notes from earlier [name]  He has a rt fem nck frx.  Hes high risk !  Medical and anesthesiology will have to determine disposition   Past Medical History:  Diagnosis Date   Arthritis    Carcinoma of prostate [facility])    prostate   CKD (chronic kidney disease), stage III ([place])    Coronary artery disease    a. 10/2012 Cath: LM 20-30d, lAD 80p, 62m 80-90d, LCX 90-963mOM1 90, RCA 50p, 2053m0d, RPDA 90, EF nl; b. 10/2012 CABG x 4 (LIMA-LAD, SVG-OM1, SVG-PDA->RPL); c. 07/2015 MV: Basal and proximal septal infarct w/o ischemia. EF 57%-->low risk.   First degree AV block 01/28/2019   Noted on EKG   GERD (gastroesophageal reflux disease)    Gout    HTN (hypertension)    Macrocytosis without anemia 01/08/2012   Moderate aortic stenosis    a. 10/2012 Echo: EF 55-60%, no rwma, BAE, mild AI, mild TR; b. 06/2020 Echo: EF 50-55%, no rwma, mild LVH. Mod dil LA. Mild MR. Mod AS.   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   Sleep apnea    Questionable   Spinal stenosis    [facility]  Past Surgical History:  Procedure Laterality Date   BALLOON DILATION N/A 10/03/2020   Procedure: BALLOON DILATION;  Surgeon: CarEloise HarmanO;  [facility];  Service: Endoscopy;  Laterality: N/A;   CARDIAC CATHETERIZATION  11/07/2012   Dr NahAcie FredricksonCATARACT EXTRACTION W/ INTRAOCULAR LENS IMPLANT Bilateral    CORONARY ARTERY BYPASS GRAFT N/A 11/11/2012   Procedure: CORONARY ARTERY BYPASS GRAFTING times four using Right Greater Saphenous Vein Graft harvested endoscopically and Left Internal Mammary Artery.;  Surgeon: PetIvin PootD;  [facility] [place] [facility];  Laterality: N/A;   ESOPHAGEAL BRUSHING  10/03/2020   Procedure: ESOPHAGEAL BRUSHING;   Surgeon: CarEloise HarmanO;  [facility];  Service: Endoscopy;;   ESOPHAGOGASTRODUODENOSCOPY (EGD) WITH PROPOFOL N/A 06/27/2020   Surgeon: CarEloise HarmanO;  large hiatal hernia, LA grade D esophagitis with no bleeding, normal examined duodenum.    ESOPHAGOGASTRODUODENOSCOPY (EGD) WITH PROPOFOL N/A 10/03/2020   Procedure: ESOPHAGOGASTRODUODENOSCOPY (EGD) WITH PROPOFOL;  Surgeon: CarEloise HarmanO;  [facility];  Service: Endoscopy;  Laterality: N/A;  2:00pm   INGUINAL HERNIA REPAIR Right 01/10/2017   Procedure: OPEN RIGHT HERNIA REPAIR INGUINAL;  Surgeon: WhiIleana RoupD;  [facility];  Service: General;  Laterality: Right;   INSERTION OF MESH Right 01/10/2017   Procedure: INSERTION OF MESH;  Surgeon: WhiIleana RoupD;  [facility];  Service: General;  Laterality: Right;   INTRAOPERATIVE TRANSESOPHAGEAL ECHOCARDIOGRAM N/A 11/11/2012   Procedure: INTRAOPERATIVE TRANSESOPHAGEAL ECHOCARDIOGRAM;  Surgeon: PetIvin PootD;  [facility] [place] [facility];  Laterality: N/A;   LEFT HEART CATHETERIZATION WITH CORONARY ANGIOGRAM N/A 11/07/2012   Procedure: LEFT HEART CATHETERIZATION WITH CORONARY ANGIOGRAM;  Surgeon: PhiThayer HeadingsD;  [facility];  Service: Cardiovascular;  Laterality: N/A;   LUMBAR LAMINECTOMY/DECOMPRESSION MICRODISCECTOMY N/A 02/05/2019   Procedure: LUMBAR LAMINECTOMY/DECOMPRESSION L3-L4;  Surgeon: GioLatanya MaudlinD;  [facility];  Service: Orthopedics;  Laterality: N/A;  2mn   LYMPH NODE DISSECTION     Bilateral pelvic   RETROPUBIC PROSTATECTOMY     Radical

## 2020-11-11 NOTE — ED Triage Notes (Signed)
Fell today, pain in right thigh area, states he has been weak

## 2020-11-12 DIAGNOSIS — Z7189 Other specified counseling: Secondary | ICD-10-CM

## 2020-11-12 DIAGNOSIS — S72001A Fracture of unspecified part of neck of right femur, initial encounter for closed fracture: Secondary | ICD-10-CM

## 2020-11-12 LAB — CBC
HCT: 28 % — ABNORMAL LOW (ref 39.0–52.0)
Hemoglobin: 8.9 g/dL — ABNORMAL LOW (ref 13.0–17.0)
MCH: 31.8 pg (ref 26.0–34.0)
MCHC: 31.8 g/dL (ref 30.0–36.0)
MCV: 100 fL (ref 80.0–100.0)
Platelets: 328 10*3/uL (ref 150–400)
RBC: 2.8 MIL/uL — ABNORMAL LOW (ref 4.22–5.81)
RDW: 15.9 % — ABNORMAL HIGH (ref 11.5–15.5)
WBC: 9.2 10*3/uL (ref 4.0–10.5)
nRBC: 0 % (ref 0.0–0.2)

## 2020-11-12 LAB — BASIC METABOLIC PANEL
Anion gap: 9 (ref 5–15)
BUN: 20 mg/dL (ref 8–23)
CO2: 26 mmol/L (ref 22–32)
Calcium: 8.5 mg/dL — ABNORMAL LOW (ref 8.9–10.3)
Chloride: 105 mmol/L (ref 98–111)
Creatinine, Ser: 1.3 mg/dL — ABNORMAL HIGH (ref 0.61–1.24)
GFR, Estimated: 52 mL/min — ABNORMAL LOW (ref >60)
Glucose, Bld: 106 mg/dL — ABNORMAL HIGH (ref 70–99)
Potassium: 3.2 mmol/L — ABNORMAL LOW (ref 3.5–5.1)
Sodium: 140 mmol/L (ref 135–145)

## 2020-11-12 LAB — SURGICAL PCR SCREEN
MRSA, PCR: NEGATIVE
Staphylococcus aureus: NEGATIVE

## 2020-11-12 LAB — PROTIME-INR
INR: 1.2 (ref 0.8–1.2)
Prothrombin Time: 14.9 seconds (ref 11.4–15.2)

## 2020-11-12 MED ORDER — ASPIRIN EC 81 MG PO TBEC
81.0000 mg | DELAYED_RELEASE_TABLET | Freq: Every day | ORAL | Status: DC
  Administered 2020-11-12: 81 mg via ORAL
  Filled 2020-11-12: qty 1

## 2020-11-12 MED ORDER — POTASSIUM CHLORIDE CRYS ER 20 MEQ PO TBCR
30.0000 meq | EXTENDED_RELEASE_TABLET | Freq: Two times a day (BID) | ORAL | Status: DC
  Administered 2020-11-12: 30 meq via ORAL
  Filled 2020-11-12: qty 1

## 2020-11-12 NOTE — Consult Note (Signed)
Consultation Note Date: 11/12/2020   Patient Name: Jon Reid  DOB: January 11, 1929  MRN: [id number]  Age / Sex: 87 y.o., male  PCP: Jon Fraise, MD Referring Physician: Edwin Reid, *  Reason for Consultation: Establishing goals of care  HPI/Patient Profile: 87 y.o. male  with past medical history of stage III chronic kidney disease, coronary artery disease with history of first-degree block, GERD, hypertension, history of prostate cancer admitted on 11/11/2020 with fracture of the right hip s/p accidental fall.   Patient has progressively weakness and frailty as well as a cardiac history that puts him at increased risk for surgery. Palliative care has been consulted to assist with goals of care conversation.  Clinical Assessment and Goals of Care:  I have reviewed medical records including EPIC notes, labs and imaging, received report from RN, assessed the patient and then met at the bedside to discuss diagnosis prognosis, GOC, EOL wishes, disposition and options.  I introduced Palliative Medicine as specialized medical care for people living with serious illness. It focuses on providing relief from the symptoms and stress of a serious illness. The goal is to improve quality of life for both the patient and the family.   We discussed a brief life review of the patient and then focused on their current illness. The natural disease trajectory and expectations at EOL were discussed. Jon Reid shares that he has been married for 62 years and shares 3 sons with his wife. 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 He reports that he and his wife were living on their own before he was admitted and he was still able to walk and care for himself, although his knee pain and worsening weakness have certainly been troubling him. His sons assist him with transportation. He recently finished with outpatient PT and  was interested in speaking with a provider about starting another round. He enjoys his current quality of life and understands the risks associated with surgery, including prolonged and difficult recovery, SNF placement, and death. He feels it is very important to continue walking and remain independent as long as possible. He is proud of his strength at this age and sometimes feels that people underestimate his ability. He would not wish to be restricted or bedbound due to pain, nor reliant on pain medications.   I attempted to elicit values and goals of care important to the patient.    The difference between aggressive medical intervention and comfort care was considered in light of the patient's goals of care.   Advanced directives, concepts specific to code status, artifical feeding and hydration, and rehospitalization were considered and discussed.  Hospice and Palliative Care services outpatient were explained and offered.  Discussed the importance of continued conversation with family and the medical providers regarding overall plan of care and treatment options, ensuring decisions are within the context of the patient's values and GOCs.    Questions and concerns were addressed. The family was encouraged to call with questions or concerns.  PMT will continue to support  holistically.   PATIENT is the primary decision maker. Next of kin is his wife Jon Reid. No HCPOA on file.    SUMMARY OF RECOMMENDATIONS   -Patient understands the risks and benefits of surgical intervention for right hip fracture, he is willing to accept this -Patient reports a good quality of life prior to his fall, his goal is to regain mobility and continue caring for himself at home -Ongoing discussions  Code Status/Advance Care Planning: Full code  Palliative Prophylaxis:  Delirium Protocol, Frequent Pain Assessment, and Turn Reposition  Additional Recommendations (Limitations, Scope, Preferences): Full Scope  Treatment  Psycho-social/Spiritual:  Desire for further Chaplaincy support:TBD Additional Recommendations: Caregiving  Support/Resources and Referral to Community Resources   Prognosis:  Unable to determine  Discharge Planning: To Be Determined      Primary Diagnoses: Present on Admission:  Femur fracture, right ([place])  Benign essential hypertension  Chronic renal insufficiency, stage III (moderate) (HCC)  Coronary artery disease involving native coronary artery of native heart without angina pectoris  Gastroesophageal reflux disease with esophagitis  Peripheral vascular disease ([place])   I have reviewed the medical record, interviewed the patient and family, and examined the patient. The following aspects are pertinent.  Past Medical History:  Diagnosis Date   Arthritis    Carcinoma of prostate ([place])    prostate   CKD (chronic kidney disease), stage III (HCC)    Coronary artery disease    a. 10/2012 Cath: LM 20-30d, lAD 80p, 75m 80-90d, LCX 90-966mOM1 90, RCA 50p, 2056m0d, RPDA 90, EF nl; b. 10/2012 CABG x 4 (LIMA-LAD, SVG-OM1, SVG-PDA->RPL); c. 07/2015 MV: Basal and proximal septal infarct w/o ischemia. EF 57%-->low risk.   First degree AV block 01/28/2019   Noted on EKG   GERD (gastroesophageal reflux disease)    Gout    HTN (hypertension)    Macrocytosis without anemia 01/08/2012   Moderate aortic stenosis    a. 10/2012 Echo: EF 55-60%, no rwma, BAE, mild AI, mild TR; b. 06/2020 Echo: EF 50-55%, no rwma, mild LVH. Mod dil LA. Mild MR. Mod AS.   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   Sleep apnea    Questionable   Spinal stenosis    [facility]  Social History   Socioeconomic History   Marital status: Married    Spouse name: BetInez CatalinaNumber of children: 3   Years of education: 12   Highest education level: High school graduate  Occupational History   Occupation: Retired  Tobacco  Use   Smoking status: Never   Smokeless tobacco: Never  VapScientific laboratory techniciane: Never used  Substance and Sexual Activity   Alcohol use: Yes    Comment: Rare   Drug use: No   Sexual activity: Not Currently  Other Topics Concern   Not on file  Social History Narrative   Lives at home with wife/sons will stay with them occassionally   Married with 3 children   Denies caffeine use    Social Determinants of HeaRadio broadcast assistantrain: Not on file  Food Insecurity: Not on file  Transportation Needs: Not on file  Physical Activity: Not on file  Stress: Not on file  Social Connections: Not on file   Family History  Problem Relation Age of Onset   Aneurysm Father        Cerebral  Stroke Father    Hypertension Father    Cancer Mother        colon   Parkinson's disease Son    Scheduled Meds:  carbidopa-levodopa  1.5 tablet Oral TID   fluticasone  2 spray Each Nare QHS   pantoprazole  40 mg Oral BID   potassium chloride  30 mEq Oral BID   Continuous Infusions: PRN Meds:.HYDROcodone-acetaminophen, ondansetron **OR** ondansetron (ZOFRAN) IV Medications Prior to Admission:  Prior to Admission medications   Medication Sig Start Date End Date Taking? Authorizing Provider  ascorbic acid (VITAMIN C) 500 MG tablet Take 1 tablet (500 mg total) by mouth daily. 06/30/20   Barton Dubois, MD  aspirin EC 81 MG tablet Take 81 mg by mouth daily.    [provider]  carbidopa-levodopa (SINEMET IR) 25-100 MG tablet Take 1.5 tablets by mouth 3 (three) times daily. 09/21/20   Kathrynn Ducking, MD  cephALEXin (KEFLEX) 500 MG capsule Take 1 capsule (500 mg total) by mouth 2 (two) times daily. 10/31/20   Gwenlyn Perking, FNP  Cholecalciferol (VITAMIN D) 2000 units CAPS Take 2,000 Units by mouth daily.    [provider]  fluticasone (FLONASE) 50 MCG/ACT nasal spray One to 2 sprays each nostril at bedtime Patient taking differently: Place 2 sprays into both nostrils at  bedtime. 10/12/14   Chipper Herb, MD  furosemide (LASIX) 40 MG tablet TAKE 1 TABLET DAILY 10/10/20   Jon Fraise, MD  Iron, Ferrous Sulfate, 325 (65 Fe) MG TABS Take 1 tablet by mouth daily. Patient taking differently: Take 325 mg by mouth daily. 06/29/20   Barton Dubois, MD  nitroGLYCERIN (NITROSTAT) 0.4 MG SL tablet Place 1 tablet (0.4 mg total) under the tongue every 5 (five) minutes as needed for chest pain. Patient not taking: Reported on 11/08/2020 07/20/15   Erlene Quan, PA-C  pantoprazole (PROTONIX) 40 MG tablet Take 1 tablet (40 mg total) by mouth 2 (two) times daily. TAKE 1 TABLET ONCE DAILY FOR REFLUX Patient taking differently: Take 40 mg by mouth 2 (two) times daily. 06/29/20   Barton Dubois, MD  triamcinolone cream (KENALOG) 0.1 % Apply 1 application topically daily as needed (itching). 06/29/20   Barton Dubois, MD  zinc sulfate 220 (50 Zn) MG capsule TAKE ONE CAPSULE ONCE DAILY Patient taking differently: Take 220 mg by mouth daily. 08/23/20   Jon Fraise, MD   Allergies  Allergen Reactions   Crestor [Rosuvastatin] Swelling    GYNECOMASTIA    Review of Systems  Musculoskeletal:        Right hip pain  All other systems reviewed and are negative.  Physical Exam Vitals and nursing note reviewed.  Constitutional:      General: He is not in acute distress.    Interventions: Nasal cannula in place.  Cardiovascular:     Rate and Rhythm: Normal rate.  Pulmonary:     Effort: Pulmonary effort is normal.  Neurological:     Mental Status: He is alert and oriented to person, place, and time.    Vital Signs: BP 134/63 (BP Location: Right Arm)   Pulse 74   Temp 99 F (37.2 C) (Oral)   Resp 16   Ht 6' (1.829 m)   Wt 68.8 kg   SpO2 94%   BMI 20.57 kg/m  Pain Scale: 0-10   Pain Score: 0-No pain   SpO2: SpO2: 94 % O2 Device:SpO2: 94 % O2 Flow Rate: .   IO: Intake/output summary:  Intake/Output  Summary (Last 24 hours) at 11/12/2020 1401 Last data filed at  11/12/2020 0900 Gross per 24 hour  Intake 0 ml  Output 150 ml  Net -150 ml    LBM: Last BM Date: 11/11/20 Baseline Weight: Weight: 68.8 kg Most recent weight: Weight: 68.8 kg     Palliative Assessment/Data:     Time In: 12:30pm Time Out: 1:40pm Time Total: 70 minutes Greater than 50% of this time was spent in counseling and coordinating care related to the above assessment and plan.  Dorthy Cooler, PA-C Palliative Medicine Team Team phone # [phone]  Thank you for allowing the Palliative Medicine Team to assist in the care of this patient. Please utilize secure chat with additional questions, if there is no response within 30 minutes please call the above phone number.  Palliative Medicine Team providers are available by phone from 7am to 7pm daily and can be reached through the team cell phone.  Should this patient require assistance outside of these hours, please call the patient's attending physician.

## 2020-11-12 NOTE — Plan of Care (Signed)

## 2020-11-12 NOTE — Progress Notes (Signed)
Patient ID: AYDRIEN KAHELE, male   DOB: 1928-05-21, 87 y.o.   MRN: [id number]  After further discussion with our anesthesia department there is serious concern over Mr Holcombs heart condition. Recent cardiology nand primary care notes indicate Mr Holcombs health is deteriorating and his heart condition is chronic but no further work up was done because of his age. He s fallen several times recently and although in a different setting he would get further work up it was decided NOT to do further cardiac testing.  In summary   His heart condition and recent failing health requires a higher level of care    Data: echo from April 2022  1. Distal septal hyokinesis. Left ventricular ejection fraction, by  estimation, is 50 to 55%. The left ventricle has low normal function. The  left ventricle demonstrates regional wall motion abnormalities (see  scoring diagram/findings for description).  There is mild left ventricular hypertrophy. Left ventricular diastolic  parameters are indeterminate.   2. Right ventricular systolic function is normal. The right ventricular  size is normal. There is mildly elevated pulmonary artery systolic  pressure.   3. Left atrial size was moderately dilated.   4. The mitral valve is abnormal. Mild mitral valve regurgitation. No  evidence of mitral stenosis. Moderate mitral annular calcification.   5. The aortic valve is tricuspid. There is moderate calcification of the  aortic valve. There is moderate thickening of the aortic valve. Aortic  valve regurgitation is mild. Moderate aortic valve stenosis.   6. The inferior vena cava is normal in size with greater than 50%  respiratory variability, suggesting right atrial pressure of 3 mmHg.   Cardiology  Hx of CABG I do not think he is having any ongoing angina.  Despite his elevated enzymes in the hospital I am not planning an ischemia work-up given his multiple comorbidities and frailty's.   EKG:  EKG is  ordered  today. Normal sinus rhythm, rate 78, right bundle branch block, left anterior fascicular block, first-degree AV block  HEART:  PMI not displaced or sustained,S1 and S2 within normal limits, no S3, no S4, no clicks, no rubs, 3 out of 6 apical systolic murmur nonradiating   Since I last saw him and since his hospitalizations he has become increasingly frail. He complains about this.

## 2020-11-12 NOTE — Progress Notes (Signed)
[facility] PROGRESS NOTE    JACIEON SHORTINO  [id number] DOB: 16-May-1928 DOA: 11/11/2020 PCP: Claretta Fraise, MD      Brief Narrative:  Mr. Neuhoff is a 87 y.o. M with parkinsonism, CAD s/p CABG in 2017, pros CA not on treatment, B12 anemia, stroke, CKD IIIb and HTN who presented with hip pain after a fall.  Has been weaker and weaker over the last 3-4 months, since daignosis of COVID.  No new sypmtoms in the last week, and on the day of admission had an accidental fall.  RIght hip pain after, couldn't walk.  In the ER, x-ray showed right hip fx.  Incidentally noted to have COVID+.            Assessment & Plan:  RIGHT hip fracture Given his age and RCRI 2, he is at elevated but reasonable cardiac risk to proceed to surgery (given the alternative is terminally bedbound and high risk for decompensation and death).    He has no active cardiac symptoms or signs and no further ischemic work up is necessary at this time.  - Continue aspirin - Consult orthopedics    Hypokalemia - Supplement K - Check Mag  Anemia, macrocytic B12 anemia treated by Julien Nordmann.  B12 from last week is still pending. - Treng Hgb post-op  Ischemic heart disease CABG in 2017 No heart symptoms recently.  CKD IIIb Cr at baseline  Parkinsonism Per Dr. Tobey Grim note, this is parkinsonism, not Parkinson's disease, and Sinemet has yet to show benefit - Continue Sinemet  GERD -Continue PPI  Hypertension History of stroke without residual BP normal -Hold furosemide  Prostate cancer Not on treatment    Moderate protein calorie malnutrition Given moderately reduced muscle mass and fat diffusely          Disposition: Status is: Inpatient  Remains inpatient appropriate because:Inpatient level of care appropriate due to severity of illness  Dispo: The patient is from: Home              Anticipated d/c is to: SNF              Patient currently is not  medically stable to d/c.   Difficult to place patient No       Level of care: Med-Surg       MDM: The below labs and imaging reports were reviewed and summarized above.  Medication management as above.    DVT prophylaxis: SCDs Start: 11/11/20 2318  Code Status: FULL Family Communication: Wife by phone    Consultants:  Ortho, Dr. Doreatha Martin Palliative Care  Procedures:    Antimicrobials:     Culture data:             Subjective: No chest pain, palpitations, dyspnea, swelling, confusion, fever.  Objective: Vitals:   11/11/20 2322 11/12/20 0500 11/12/20 0541 11/12/20 0800  BP: 136/66  (!) 110/50 (!) 103/42  Pulse: 70  72 66  Resp: 20  20   Temp: 98.2 F (36.8 C)  98.4 F (36.9 C)   TempSrc:      SpO2: 100%  96%   Weight:  68.8 kg    Height:  6' (1.829 m)      Intake/Output Summary (Last 24 hours) at 11/12/2020 1023 Last data filed at 11/12/2020 0500 Gross per 24 hour  Intake --  Output 150 ml  Net -150 ml   Filed Weights   11/12/20 0500  Weight: 68.8 kg    Examination: General appearance: Elderly  adult male, alert and in no acute distress.   HEENT: Anicteric, conjunctiva pink, lids and lashes normal. No nasal deformity, discharge, epistaxis.  Lips moist, dentition in good repair, oropharynx moist, hearing.   Skin: Warm and dry.  no jaundice.  No suspicious rashes or lesions. Cardiac: RRR, nl S1-S2, no murmurs appreciated.  Capillary refill is brisk.  JVP normal.  No LE edema.  Radial pulses 2+ and symmetric. Respiratory: Normal respiratory rate and rhythm.  CTAB without rales or wheezes. Abdomen: Abdomen soft.  No TTP or guarding. No ascites, distension, hepatosplenomegaly.   MSK: The right leg is foreshortened and rotated to the outside. Reduced mm and fat. Neuro: Awake and alert.  EOMI, moves upper extremities with normal strength and coordination. Speech fluent.    Psych: Sensorium intact and responding to questions, attention normal.  Affect normal.  Judgment and insight appear normal.    Data Reviewed: I have personally reviewed following labs and imaging studies:  CBC: Recent Labs  Lab 11/08/20 1553 11/11/20 1917 11/12/20 0416  WBC 7.4 12.4* 9.2  NEUTROABS 4.0 10.5*  --   HGB 9.7* 11.1* 8.9*  HCT 29.7* 34.7* 28.0*  MCV 94 99.7 100.0  PLT 408 389 [id number]   Basic Metabolic Panel: Recent Labs  Lab 11/08/20 1553 11/11/20 1917 11/12/20 0416  NA 142 138 140  K 3.6 3.3* 3.2*  CL 104 102 105  CO2 '24 25 26  '$ GLUCOSE 120* 111* 106*  BUN '17 19 20  '$ CREATININE 1.46* 1.41* 1.30*  CALCIUM 8.9 8.8* 8.5*   GFR: Estimated Creatinine Clearance: 36.8 mL/min (A) (by C-G formula based on SCr of 1.3 mg/dL (H)). Liver Function Tests: Recent Labs  Lab 11/11/20 1917  AST 28  ALT 15  ALKPHOS 84  BILITOT 0.7  PROT 7.3  ALBUMIN 4.1   No results for input(s): LIPASE, AMYLASE in the last 168 hours. No results for input(s): AMMONIA in the last 168 hours. Coagulation Profile: Recent Labs  Lab 11/12/20 0416  INR 1.2   Cardiac Enzymes: No results for input(s): CKTOTAL, CKMB, CKMBINDEX, TROPONINI in the last 168 hours. BNP (last 3 results) No results for input(s): PROBNP in the last 8760 hours. HbA1C: No results for input(s): HGBA1C in the last 72 hours. CBG: No results for input(s): GLUCAP in the last 168 hours. Lipid Profile: No results for input(s): CHOL, HDL, LDLCALC, TRIG, CHOLHDL, LDLDIRECT in the last 72 hours. Thyroid Function Tests: No results for input(s): TSH, T4TOTAL, FREET4, T3FREE, THYROIDAB in the last 72 hours. Anemia Panel: No results for input(s): VITAMINB12, FOLATE, FERRITIN, TIBC, IRON, RETICCTPCT in the last 72 hours. Urine analysis:    Component Value Date/Time   COLORURINE YELLOW 11/11/2020 1933   APPEARANCEUR CLEAR 11/11/2020 1933   LABSPEC 1.011 11/11/2020 1933   PHURINE 5.0 11/11/2020 1933   GLUCOSEU NEGATIVE 11/11/2020 [place] NEGATIVE 11/11/2020 [place] NEGATIVE  11/11/2020 1933   BILIRUBINUR neg 07/07/2012 1455   KETONESUR 5 (A) 11/11/2020 1933   PROTEINUR NEGATIVE 11/11/2020 1933   UROBILINOGEN 0.2 11/08/2012 1927   NITRITE NEGATIVE 11/11/2020 1933   LEUKOCYTESUR NEGATIVE 11/11/2020 1933   Sepsis Labs: '@LABRCNTIP'$ (procalcitonin:4,lacticacidven:4)  ) Recent Results (from the past 240 hour(s))  Resp Panel by RT-PCR (Flu A&B, Covid)     Status: Abnormal   Collection Time: 11/11/20  8:14 PM   Specimen: Nasopharyngeal(NP) swabs in vial transport medium  Result Value Ref Range Status   SARS Coronavirus 2 by RT PCR POSITIVE (A) NEGATIVE Final  Comment: CRITICAL RESULT CALLED TO, READ BACK BY AND VERIFIED WITH: TURNER,C 2154 11/11/2020 COLEMAN,R (NOTE) SARS-CoV-2 target nucleic acids are DETECTED.  The SARS-CoV-2 RNA is generally detectable in upper respiratory specimens during the acute phase of infection. Positive results are indicative of the presence of the identified virus, but do not rule out bacterial infection or co-infection with other pathogens not detected by the test. Clinical correlation with patient history and other diagnostic information is necessary to determine patient infection status. The expected result is Negative.  Fact Sheet for Patients: EntrepreneurPulse.com.au  Fact Sheet for Healthcare Providers: IncredibleEmployment.be  This test is not yet approved or cleared by the [place] FDA and  has been authorized for detection and/or diagnosis of SARS-CoV-2 by FDA under an Emergency Use Authorization (EUA).  This EUA will remain in effect (meaning this t est can be used) for the duration of  the COVID-19 declaration under Section 564(b)(1) of the Act, 21 U.S.C. section 360bbb-3(b)(1), unless the authorization is terminated or revoked sooner.     Influenza A by PCR NEGATIVE NEGATIVE Final   Influenza B by PCR NEGATIVE NEGATIVE Final    Comment: (NOTE) The Xpert Xpress  SARS-CoV-2/FLU/RSV plus assay is intended as an aid in the diagnosis of influenza from Nasopharyngeal swab specimens and should not be used as a sole basis for treatment. Nasal washings and aspirates are unacceptable for Xpert Xpress SARS-CoV-2/FLU/RSV testing.  Fact Sheet for Patients: EntrepreneurPulse.com.au  Fact Sheet for Healthcare Providers: IncredibleEmployment.be  This test is not yet approved or cleared by the [place] FDA and has been authorized for detection and/or diagnosis of SARS-CoV-2 by FDA under an Emergency Use Authorization (EUA). This EUA will remain in effect (meaning this test can be used) for the duration of the COVID-19 declaration under Section 564(b)(1) of the Act, 21 U.S.C. section 360bbb-3(b)(1), unless the authorization is terminated or revoked.  Performed at [facility], [address], [place], [place] [address]   Surgical pcr screen     Status: None   Collection Time: 11/11/20 11:44 PM   Specimen: Nasal Mucosa; Nasal Swab  Result Value Ref Range Status   MRSA, PCR NEGATIVE NEGATIVE Final   Staphylococcus aureus NEGATIVE NEGATIVE Final    Comment: (NOTE) The Xpert SA Assay (FDA approved for NASAL specimens in patients 63 years of age and older), is one component of a comprehensive surveillance program. It is not intended to diagnose infection nor to guide or monitor treatment. Performed at [facility], [address], [place], [place] [address]          Radiology Studies: CT HEAD WO CONTRAST (5MM)  Result Date: 11/11/2020 CLINICAL DATA:  Fall. EXAM: CT HEAD WITHOUT CONTRAST TECHNIQUE: Contiguous axial images were obtained from the base of the skull through the vertex without intravenous contrast. COMPARISON:  MRI brain dated November 02, 2019. FINDINGS: Brain: No evidence of acute infarction, hemorrhage, hydrocephalus, extra-axial collection or mass lesion/mass effect. Stable moderate atrophy and mild  chronic microvascular ischemic changes. Vascular: Atherosclerotic vascular calcification of the carotid siphons. No hyperdense vessel. Skull: Normal. Negative for fracture or focal lesion. Sinuses/Orbits: No acute finding. Other: None. IMPRESSION: 1. No acute intracranial abnormality. 2. Stable moderate atrophy and mild chronic microvascular ischemic changes. Electronically Signed   By: Titus Dubin M.D.   On: 11/11/2020 19:47   DG Hip Unilat W or Wo Pelvis 2-3 Views Right  Result Date: 11/11/2020 CLINICAL DATA:  Fall, right hip pain EXAM: DG HIP (WITH OR WITHOUT PELVIS) 2-3V  RIGHT COMPARISON:  None. FINDINGS: Subcapital right hip fracture with varus angulation and foreshortening. Visualized bony pelvis appears intact. Old left inferior pubic ramus fracture. Left hip joint space is preserved. IMPRESSION: Subcapital right hip fracture, as above. Electronically Signed   By: Julian Hy M.D.   On: 11/11/2020 20:08        Scheduled Meds:  carbidopa-levodopa  1.5 tablet Oral TID   fluticasone  2 spray Each Nare QHS   pantoprazole  40 mg Oral BID   potassium chloride  30 mEq Oral BID   Continuous Infusions:   LOS: 1 day    Time spent: 25-minute    Edwin Dada, MD [facility] 11/12/2020, 10:23 AM     Please page though [place] or Epic secure chat:  For [employer], Adult nurse

## 2020-11-12 NOTE — Progress Notes (Signed)
Called report to [facility] Cabin crew. Called report to [facility] Orthopedic floor RN. [facility] states they will be here within an hour to pick up patient to transfer.

## 2020-11-12 NOTE — Progress Notes (Signed)
Patient arrived via [facility] from [facility] awake,alert/orientedx4 and able to verbalize needs. VSS; NAD noted; respiration even on 2L O2 via nasal cannula. Movement/sensation to extremities noted. All safety measures in place and all personal belongings within reach.

## 2020-11-13 ENCOUNTER — Inpatient Hospital Stay (HOSPITAL_COMMUNITY): Payer: MEDICARE | Admitting: Anesthesiology

## 2020-11-13 ENCOUNTER — Encounter (HOSPITAL_COMMUNITY)
Admission: EM | Disposition: A | Discharge status: Rehabilitation Facility | Payer: Self-pay | Source: Home / Self Care | Attending: Internal Medicine

## 2020-11-13 ENCOUNTER — Inpatient Hospital Stay (HOSPITAL_COMMUNITY): Payer: MEDICARE

## 2020-11-13 HISTORY — PX: HIP ARTHROPLASTY: SHX981

## 2020-11-13 LAB — BASIC METABOLIC PANEL
Anion gap: 7 (ref 5–15)
BUN: 14 mg/dL (ref 8–23)
CO2: 26 mmol/L (ref 22–32)
Calcium: 8.3 mg/dL — ABNORMAL LOW (ref 8.9–10.3)
Chloride: 105 mmol/L (ref 98–111)
Creatinine, Ser: 1.31 mg/dL — ABNORMAL HIGH (ref 0.61–1.24)
GFR, Estimated: 52 mL/min — ABNORMAL LOW (ref >60)
Glucose, Bld: 123 mg/dL — ABNORMAL HIGH (ref 70–99)
Potassium: 3.7 mmol/L (ref 3.5–5.1)
Sodium: 138 mmol/L (ref 135–145)

## 2020-11-13 LAB — CBC
HCT: 29.4 % — ABNORMAL LOW (ref 39.0–52.0)
Hemoglobin: 9.3 g/dL — ABNORMAL LOW (ref 13.0–17.0)
MCH: 31.2 pg (ref 26.0–34.0)
MCHC: 31.6 g/dL (ref 30.0–36.0)
MCV: 98.7 fL (ref 80.0–100.0)
Platelets: 298 10*3/uL (ref 150–400)
RBC: 2.98 MIL/uL — ABNORMAL LOW (ref 4.22–5.81)
RDW: 16.1 % — ABNORMAL HIGH (ref 11.5–15.5)
WBC: 11.6 10*3/uL — ABNORMAL HIGH (ref 4.0–10.5)
nRBC: 0 % (ref 0.0–0.2)

## 2020-11-13 LAB — MAGNESIUM: Magnesium: 1.8 mg/dL (ref 1.7–2.4)

## 2020-11-13 LAB — VITAMIN D 25 HYDROXY (VIT D DEFICIENCY, FRACTURES): Vit D, 25-Hydroxy: 24.04 ng/mL — ABNORMAL LOW (ref 30–100)

## 2020-11-13 SURGERY — HEMIARTHROPLASTY, HIP, DIRECT ANTERIOR APPROACH, FOR FRACTURE
Anesthesia: General | Site: Hip | Laterality: Right

## 2020-11-13 MED ORDER — TRAMADOL HCL 50 MG PO TABS
50.0000 mg | ORAL_TABLET | Freq: Four times a day (QID) | ORAL | Status: DC
Start: 2020-11-13 — End: 2020-11-13
  Filled 2020-11-13: qty 1

## 2020-11-13 MED ORDER — ROCURONIUM 10MG/ML (10ML) SYRINGE FOR MEDFUSION PUMP - OPTIME
INTRAVENOUS | Status: DC | PRN
  Administered 2020-11-13: 50 mg via INTRAVENOUS

## 2020-11-13 MED ORDER — TRANEXAMIC ACID 1000 MG/10ML IV SOLN
INTRAVENOUS | Status: DC | PRN
  Administered 2020-11-13: 1000 mg via INTRAVENOUS

## 2020-11-13 MED ORDER — BUPIVACAINE LIPOSOME 1.3 % IJ SUSP
INTRAMUSCULAR | Status: AC
  Filled 2020-11-13: qty 20

## 2020-11-13 MED ORDER — PHENYLEPHRINE HCL-NACL 20-0.9 MG/250ML-% IV SOLN
INTRAVENOUS | Status: DC | PRN
  Administered 2020-11-13: 50 ug/min via INTRAVENOUS

## 2020-11-13 MED ORDER — MORPHINE SULFATE (PF) 2 MG/ML IV SOLN: 0.5000 mg | INTRAVENOUS | Status: DC | PRN

## 2020-11-13 MED ORDER — ACETAMINOPHEN 500 MG PO TABS
500.0000 mg | ORAL_TABLET | Freq: Four times a day (QID) | ORAL | Status: DC
  Administered 2020-11-13: 500 mg via ORAL
  Filled 2020-11-13: qty 1

## 2020-11-13 MED ORDER — ACETAMINOPHEN 10 MG/ML IV SOLN
INTRAVENOUS | Status: DC | PRN
  Administered 2020-11-13: 1000 mg via INTRAVENOUS

## 2020-11-13 MED ORDER — PROMETHAZINE HCL 25 MG/ML IJ SOLN: 6.2500 mg | INTRAMUSCULAR | Status: DC | PRN

## 2020-11-13 MED ORDER — METOCLOPRAMIDE HCL 5 MG/ML IJ SOLN
5.0000 mg | Freq: Three times a day (TID) | INTRAMUSCULAR | Status: DC | PRN
  Filled 2020-11-13: qty 2

## 2020-11-13 MED ORDER — PROPOFOL 10 MG/ML IV BOLUS
INTRAVENOUS | Status: DC | PRN
  Administered 2020-11-13: 70 mg via INTRAVENOUS

## 2020-11-13 MED ORDER — BISACODYL 10 MG RE SUPP: 10.0000 mg | Freq: Every day | RECTAL | Status: DC | PRN

## 2020-11-13 MED ORDER — DEXAMETHASONE SODIUM PHOSPHATE 10 MG/ML IJ SOLN
INTRAMUSCULAR | Status: AC
  Filled 2020-11-13: qty 1

## 2020-11-13 MED ORDER — FENTANYL CITRATE (PF) 100 MCG/2ML IJ SOLN: 25.0000 ug | INTRAMUSCULAR | Status: DC | PRN

## 2020-11-13 MED ORDER — TRANEXAMIC ACID-NACL 1000-0.7 MG/100ML-% IV SOLN
1000.0000 mg | Freq: Once | INTRAVENOUS | Status: AC
  Administered 2020-11-13: 1000 mg via INTRAVENOUS
  Filled 2020-11-13: qty 100

## 2020-11-13 MED ORDER — FENTANYL CITRATE (PF) 100 MCG/2ML IJ SOLN
INTRAMUSCULAR | Status: AC
  Filled 2020-11-13: qty 2

## 2020-11-13 MED ORDER — LACTATED RINGERS IV SOLN
INTRAVENOUS | Status: DC | PRN
Start: 2020-11-13 — End: 2020-11-13

## 2020-11-13 MED ORDER — DOCUSATE SODIUM 100 MG PO CAPS
100.0000 mg | ORAL_CAPSULE | Freq: Two times a day (BID) | ORAL | Status: DC
  Administered 2020-11-13 – 2020-11-17 (×8): 100 mg via ORAL
  Filled 2020-11-13 (×8): qty 1

## 2020-11-13 MED ORDER — TRANEXAMIC ACID-NACL 1000-0.7 MG/100ML-% IV SOLN
INTRAVENOUS | Status: AC
  Filled 2020-11-13: qty 100

## 2020-11-13 MED ORDER — ENOXAPARIN SODIUM 30 MG/0.3ML IJ SOSY
30.0000 mg | PREFILLED_SYRINGE | INTRAMUSCULAR | Status: DC
  Administered 2020-11-14 – 2020-11-15 (×2): 30 mg via SUBCUTANEOUS
  Filled 2020-11-13 (×2): qty 0.3

## 2020-11-13 MED ORDER — PHENYLEPHRINE 40 MCG/ML (10ML) SYRINGE FOR IV PUSH (FOR BLOOD PRESSURE SUPPORT)
PREFILLED_SYRINGE | INTRAVENOUS | Status: AC
  Filled 2020-11-13: qty 10

## 2020-11-13 MED ORDER — ONDANSETRON HCL 4 MG/2ML IJ SOLN
INTRAMUSCULAR | Status: AC
  Filled 2020-11-13: qty 2

## 2020-11-13 MED ORDER — METHOCARBAMOL 1000 MG/10ML IJ SOLN
500.0000 mg | Freq: Four times a day (QID) | INTRAVENOUS | Status: DC | PRN
  Filled 2020-11-13: qty 5

## 2020-11-13 MED ORDER — SUGAMMADEX SODIUM 200 MG/2ML IV SOLN
INTRAVENOUS | Status: DC | PRN
  Administered 2020-11-13: 200 mg via INTRAVENOUS

## 2020-11-13 MED ORDER — CEFAZOLIN SODIUM-DEXTROSE 2-4 GM/100ML-% IV SOLN
2.0000 g | Freq: Four times a day (QID) | INTRAVENOUS | Status: AC
Start: 2020-11-13 — End: 2020-11-13
  Administered 2020-11-13 (×2): 2 g via INTRAVENOUS
  Filled 2020-11-13 (×2): qty 100

## 2020-11-13 MED ORDER — ALUM & MAG HYDROXIDE-SIMETH 200-200-20 MG/5ML PO SUSP: 30.0000 mL | ORAL | Status: DC | PRN

## 2020-11-13 MED ORDER — PROPOFOL 10 MG/ML IV BOLUS
INTRAVENOUS | Status: AC
  Filled 2020-11-13: qty 20

## 2020-11-13 MED ORDER — PHENOL 1.4 % MT LIQD: 1.0000 | OROMUCOSAL | Status: DC | PRN

## 2020-11-13 MED ORDER — FENTANYL CITRATE (PF) 250 MCG/5ML IJ SOLN
INTRAMUSCULAR | Status: AC
  Filled 2020-11-13: qty 5

## 2020-11-13 MED ORDER — LIDOCAINE HCL (CARDIAC) PF 100 MG/5ML IV SOSY
PREFILLED_SYRINGE | INTRAVENOUS | Status: DC | PRN
  Administered 2020-11-13: 100 mg via INTRATRACHEAL

## 2020-11-13 MED ORDER — LIDOCAINE 2% (20 MG/ML) 5 ML SYRINGE
INTRAMUSCULAR | Status: AC
  Filled 2020-11-13: qty 5

## 2020-11-13 MED ORDER — HYDROCODONE-ACETAMINOPHEN 7.5-325 MG PO TABS: 1.0000 | ORAL_TABLET | ORAL | Status: DC | PRN

## 2020-11-13 MED ORDER — CEFAZOLIN SODIUM-DEXTROSE 2-4 GM/100ML-% IV SOLN
INTRAVENOUS | Status: AC
  Filled 2020-11-13: qty 100

## 2020-11-13 MED ORDER — ONDANSETRON HCL 4 MG PO TABS: 4.0000 mg | ORAL_TABLET | Freq: Four times a day (QID) | ORAL | Status: DC | PRN

## 2020-11-13 MED ORDER — ACETAMINOPHEN 325 MG PO TABS: 325.0000 mg | ORAL_TABLET | Freq: Four times a day (QID) | ORAL | Status: DC | PRN

## 2020-11-13 MED ORDER — DEXAMETHASONE SODIUM PHOSPHATE 10 MG/ML IJ SOLN
INTRAMUSCULAR | Status: DC | PRN
  Administered 2020-11-13: 10 mg via INTRAVENOUS

## 2020-11-13 MED ORDER — ONDANSETRON HCL 4 MG/2ML IJ SOLN: 4.0000 mg | Freq: Four times a day (QID) | INTRAMUSCULAR | Status: DC | PRN

## 2020-11-13 MED ORDER — METOCLOPRAMIDE HCL 5 MG PO TABS: 5.0000 mg | ORAL_TABLET | Freq: Three times a day (TID) | ORAL | Status: DC | PRN

## 2020-11-13 MED ORDER — METHOCARBAMOL 500 MG PO TABS: 500.0000 mg | ORAL_TABLET | Freq: Four times a day (QID) | ORAL | Status: DC | PRN

## 2020-11-13 MED ORDER — ONDANSETRON HCL 4 MG/2ML IJ SOLN
INTRAMUSCULAR | Status: DC | PRN
  Administered 2020-11-13: 4 mg via INTRAVENOUS

## 2020-11-13 MED ORDER — HYDROCODONE-ACETAMINOPHEN 5-325 MG PO TABS
1.0000 | ORAL_TABLET | ORAL | Status: DC | PRN
  Administered 2020-11-15: 1 via ORAL
  Filled 2020-11-13: qty 1

## 2020-11-13 MED ORDER — PHENYLEPHRINE HCL (PRESSORS) 10 MG/ML IV SOLN
INTRAVENOUS | Status: DC | PRN
  Administered 2020-11-13: 80 ug via INTRAVENOUS

## 2020-11-13 MED ORDER — FENTANYL CITRATE (PF) 250 MCG/5ML IJ SOLN
INTRAMUSCULAR | Status: DC | PRN
  Administered 2020-11-13 (×2): 50 ug via INTRAVENOUS

## 2020-11-13 MED ORDER — MENTHOL 3 MG MT LOZG: 1.0000 | LOZENGE | OROMUCOSAL | Status: DC | PRN

## 2020-11-13 MED ORDER — POLYETHYLENE GLYCOL 3350 17 G PO PACK
17.0000 g | PACK | Freq: Every day | ORAL | Status: DC | PRN
  Administered 2020-11-17: 17 g via ORAL
  Filled 2020-11-13: qty 1

## 2020-11-13 MED ORDER — ASPIRIN EC 81 MG PO TBEC
81.0000 mg | DELAYED_RELEASE_TABLET | Freq: Two times a day (BID) | ORAL | Status: DC
  Administered 2020-11-13 – 2020-11-17 (×8): 81 mg via ORAL
  Filled 2020-11-13 (×8): qty 1

## 2020-11-13 SURGICAL SUPPLY — 31 items
BAG COUNTER SPONGE SURGICOUNT (BAG) ×1 IMPLANT
BAG SPNG CNTER NS LX DISP (BAG) ×1
BALLN ATTAIN 80 (BALLOONS) ×2
BALLOON ATTAIN 80 (BALLOONS) IMPLANT
BLADE SAGITTAL 25.0X1.27X90 (BLADE) ×1 IMPLANT
CLSR STERI-STRIP ANTIMIC 1/2X4 (GAUZE/BANDAGES/DRESSINGS) ×2 IMPLANT
DRAPE ORTHO SPLIT 77X108 STRL (DRAPES) ×2
DRAPE SURG ORHT 6 SPLT 77X108 (DRAPES) IMPLANT
DRAPE U-SHAPE 47X51 STRL (DRAPES) ×1 IMPLANT
DRSG MEPILEX BORDER 4X8 (GAUZE/BANDAGES/DRESSINGS) ×1 IMPLANT
DURAPREP 26ML APPLICATOR (WOUND CARE) ×1 IMPLANT
GLOVE SURG POLYISO LF SZ7.5 (GLOVE) ×1 IMPLANT
GOWN STRL REUS W/TWL XL LVL3 (GOWN DISPOSABLE) ×2 IMPLANT
HEAD MODULAR ENDO (Orthopedic Implant) ×2 IMPLANT
HEAD UNPLR 50XMDLR STRL HIP (Orthopedic Implant) IMPLANT
IMMOBILIZER KNEE 22 UNIV (SOFTGOODS) ×1 IMPLANT
MANIFOLD NEPTUNE II (INSTRUMENTS) ×1 IMPLANT
NS IRRIG 1000ML POUR BTL (IV SOLUTION) ×1 IMPLANT
PACK TOTAL JOINT (CUSTOM PROCEDURE TRAY) ×1 IMPLANT
RETRIEVER SUT HEWSON (MISCELLANEOUS) ×1 IMPLANT
SLEEVE UNITRAX (Orthopedic Implant) ×1 IMPLANT
STEM ACCOLADE SZ 6 (Hips) ×1 IMPLANT
SUT FIBERWIRE #2 38 REV NDL BL (SUTURE) ×4
SUT VIC AB 0 CT1 27 (SUTURE) ×2
SUT VIC AB 0 CT1 27XBRD ANBCTR (SUTURE) IMPLANT
SUT VIC AB 1 CT1 27 (SUTURE) ×2
SUT VIC AB 1 CT1 27XBRD ANBCTR (SUTURE) IMPLANT
SUT VIC AB 2-0 CT1 27 (SUTURE) ×2
SUT VIC AB 2-0 CT1 TAPERPNT 27 (SUTURE) IMPLANT
SUTURE FIBERWR#2 38 REV NDL BL (SUTURE) IMPLANT
TOWEL OR NON WOVEN STRL DISP B (DISPOSABLE) ×1 IMPLANT

## 2020-11-13 NOTE — Op Note (Signed)
11/11/2020 - 11/13/2020  9:29 AM  PATIENT:  Jon Reid   MRN: [id number]  PRE-OPERATIVE DIAGNOSIS:  Right Hip Fx  POST-OPERATIVE DIAGNOSIS:  Right Hip Fx  PROCEDURE:  Procedure(s): ARTHROPLASTY BIPOLAR HIP (HEMIARTHROPLASTY)  PREOPERATIVE INDICATIONS:  [name] is an 87 y.o. male who was admitted 11/11/2020 with a diagnosis of <principal problem not specified> and elected for surgical management.  The risks benefits and alternatives were discussed with the patient including but not limited to the risks of nonoperative treatment, versus surgical intervention including infection, bleeding, nerve injury, periprosthetic fracture, the need for revision surgery, dislocation, leg length discrepancy, blood clots, cardiopulmonary complications, morbidity, mortality, among others, and they were willing to proceed.  Predicted outcome is good, although there will be at least a six to nine month expected recovery.   OPERATIVE REPORT     SURGEON:  Edmonia Lynch, MD    ASSISTANT:  Aggie Moats, PA-C, he was present and scrubbed throughout the case, critical for completion in a timely fashion, and for retraction, instrumentation, and closure.     ANESTHESIA:  General    COMPLICATIONS:  None.      COMPONENTS:  [employer]: Femoral stem: 6, Femoral Head:50, Neck:0   PROCEDURE IN DETAIL: The patient was met in the holding area and identified.  The appropriate hip  was marked at the operative site. The patient was then transported to the OR and  placed under general anesthesia.  At that point, the patient was  placed in the lateral decubitus position with the operative side up and  secured to the operating room table and all bony prominences padded.     The operative lower extremity was prepped from the iliac crest to the toes.  Sterile draping was performed.  Time out was performed prior to incision.      A routine posterolateral approach was utilized via sharp dissection  carried down to  the subcutaneous tissue.  Gross bleeders were Bovie  coagulated.  The iliotibial band was identified and incised  along the length of the skin incision.  Self-retaining retractors were  inserted. I examined the bursa there was significant hematoma and edema I performed a bursectomy here.  With the hip internally rotated, the short external rotators  were identified. The piriformis was tagged with FiberWire, and the hip capsule released in a T-type fashion.  The femoral neck was exposed, and I resected the femoral neck using the appropriate jig. This was performed at approximately a thumb's breadth above the lesser trochanter.    I then exposed the deep acetabulum, cleared out any tissue including the ligamentum teres, and included the hip capsule in the FiberWire used above and below the T.    I then prepared the proximal femur using the cookie-cutter, the lateralizing reamer, and then sequentially broached.  A trial utilized, and I reduced the hip and it was found to have excellent stability with functional range of motion. The trial components were then removed.   The canal and acetabulum were thoroughly irrigated  I inserted the pressfit stem and placed the head and neck collar. The hip was reduced with appropriate force and was stable through a range of motion.   I then used a 2 mm drill bits to pass the FiberWire suture from the capsule and puriform is through the greater trochanter, and secured this. Excellent posterior capsular repair was achieved. I also closed the T in the capsule.  I then irrigated the hip copiously again with pulse  lavage, and repaired the fascia with Vicryl, followed by Vicryl for the subcutaneous tissue, Monocryl for the skin, Steri-Strips and sterile gauze. The wounds were injected. The patient was then awakened and returned to PACU in stable and satisfactory condition. There were no complications.  POST-OP PLAN: Weight bearing as tolerated. DVT px will consist of  SCD's and chemical px  Edmonia Lynch, MD Orthopedic Surgeon [phone]   11/13/2020 9:29 AM

## 2020-11-13 NOTE — Anesthesia Procedure Notes (Signed)
Procedure Name: Intubation Date/Time: 11/13/2020 8:22 AM Performed by: Claris Che, CRNA Pre-anesthesia Checklist: Patient identified, Emergency Drugs available, Suction available, Patient being monitored and Timeout performed Patient Re-evaluated:Patient Re-evaluated prior to induction Oxygen Delivery Method: Circle system utilized Preoxygenation: Pre-oxygenation with 100% oxygen Induction Type: IV induction and Cricoid Pressure applied Ventilation: Mask ventilation without difficulty Laryngoscope Size: Mac and 4 Grade View: Grade I Tube type: Oral Tube size: 8.0 mm Number of attempts: 1 Airway Equipment and Method: Stylet Placement Confirmation: ETT inserted through vocal cords under direct vision, positive ETCO2 and breath sounds checked- equal and bilateral Secured at: 24 cm Tube secured with: Tape Dental Injury: Teeth and Oropharynx as per pre-operative assessment

## 2020-11-13 NOTE — Evaluation (Signed)
Clinical/Bedside Swallow Evaluation Patient Details  Name: CODEN Morton MRN: [id number] Date of Birth: October 08, 1928  Today's Date: 11/13/2020 Time: SLP Start Time (ACUTE ONLY): 47 SLP Stop Time (ACUTE ONLY): 0335 SLP Time Calculation (min) (ACUTE ONLY): 20 min  Past Medical History:  Past Medical History:  Diagnosis Date   Arthritis    Carcinoma of prostate ([place])    prostate   CKD (chronic kidney disease), stage III ([place])    Coronary artery disease    a. 10/2012 Cath: LM 20-30d, lAD 80p, 12m 80-90d, LCX 90-953mOM1 90, RCA 50p, 2071m0d, RPDA 90, EF nl; b. 10/2012 CABG x 4 (LIMA-LAD, SVG-OM1, SVG-PDA->RPL); c. 07/2015 MV: Basal and proximal septal infarct w/o ischemia. EF 57%-->low risk.   First degree AV block 01/28/2019   Noted on EKG   GERD (gastroesophageal reflux disease)    Gout    HTN (hypertension)    Macrocytosis without anemia 01/08/2012   Moderate aortic stenosis    a. 10/2012 Echo: EF 55-60%, no rwma, BAE, mild AI, mild TR; b. 06/2020 Echo: EF 50-55%, no rwma, mild LVH. Mod dil LA. Mild MR. Mod AS.   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  Past Surgical History:  Past Surgical History:  Procedure Laterality Date   BALLOON DILATION N/A 10/03/2020   Procedure: BALLOON DILATION;  Surgeon: CarEloise HarmanO;  [facility];  Service: Endoscopy;  Laterality: N/A;   CARDIAC CATHETERIZATION  11/07/2012   Dr NahAcie FredricksonCATARACT EXTRACTION W/ INTRAOCULAR LENS IMPLANT Bilateral    CORONARY ARTERY BYPASS GRAFT N/A 11/11/2012   Procedure: CORONARY ARTERY BYPASS GRAFTING times four using Right Greater Saphenous Vein Graft harvested endoscopically and Left Internal Mammary Artery.;  Surgeon: PetIvin PootD;  [facility];  Laterality: N/A;   ESOPHAGEAL BRUSHING  10/03/2020   Procedure:  ESOPHAGEAL BRUSHING;  Surgeon: CarEloise HarmanO;  [facility];  Service: Endoscopy;;   ESOPHAGOGASTRODUODENOSCOPY (EGD) WITH PROPOFOL N/A 06/27/2020   Surgeon: CarEloise HarmanO;  large hiatal hernia, LA grade D esophagitis with no bleeding, normal examined duodenum.    ESOPHAGOGASTRODUODENOSCOPY (EGD) WITH PROPOFOL N/A 10/03/2020   Procedure: ESOPHAGOGASTRODUODENOSCOPY (EGD) WITH PROPOFOL;  Surgeon: CarEloise HarmanO;  [facility];  Service: Endoscopy;  Laterality: N/A;  2:00pm   INGUINAL HERNIA REPAIR Right 01/10/2017   Procedure: OPEN RIGHT HERNIA REPAIR INGUINAL;  Surgeon: WhiIleana RoupD;  [facility];  Service: General;  Laterality: Right;   INSERTION OF MESH Right 01/10/2017   Procedure: INSERTION OF MESH;  Surgeon: WhiIleana RoupD;  [facility];  Service: General;  Laterality: Right;   INTRAOPERATIVE TRANSESOPHAGEAL ECHOCARDIOGRAM N/A 11/11/2012   Procedure: INTRAOPERATIVE TRANSESOPHAGEAL ECHOCARDIOGRAM;  Surgeon: PetIvin PootD;  [facility];  Laterality: N/A;   LEFT HEART CATHETERIZATION WITH CORONARY ANGIOGRAM N/A 11/07/2012   Procedure: LEFT HEART CATHETERIZATION WITH CORONARY ANGIOGRAM;  Surgeon: PhiThayer HeadingsD;  [facility];  Service: Cardiovascular;  Laterality: N/A;   LUMBAR LAMINECTOMY/DECOMPRESSION MICRODISCECTOMY N/A 02/05/2019   Procedure: LUMBAR LAMINECTOMY/DECOMPRESSION L3-L4;  Surgeon: GioLatanya MaudlinD;  Location: [facility];  Service: Orthopedics;  Laterality: N/A;  52m1m LYMPH NODE DISSECTION  Bilateral pelvic   RETROPUBIC PROSTATECTOMY     Radical   HPI:  Patient is a 87 y.o. male with PMH: GERD (EGD July 2022 showing large hiatal hernia, gastritis, non-severe candidiasis esophagitis with no bleeding), stage III chronic kidney disease, coronary artery disease with history of first-degree block, hypertension, history of prostate cancer. he presented to  hospital after a fall just prior to presentation. CT head negative, hip xray shows fracture of right hip.   Assessment / Plan / Recommendation Clinical Impression  Patient presents with an oropharyngeal swallow that [facility] to [facility] and is without any overt s/s aspiration or penetration. Patient is a good historian and describes what is approximately a 30 year h/o GERD. He manages this with prescribed PPI. He is aware of foods to avoid but does state that he is having more reflux symptoms than usual with tomatos and tomato-based foods but that otherwise, he has not noticed any change in his swallow function. SLP reviewed chart and he did have EGD on 10/03/2020 and large hiatal hernia was found but no diet changes recommended. SLP provided patient with education on reflux precautions and he verbalized understanding. No further SLP intervention recommended at this time. SLP Visit Diagnosis: Dysphagia, unspecified (R13.10)    Aspiration Risk  No limitations;Mild aspiration risk    Diet Recommendation Regular;Thin liquid   Liquid Administration via: Straw;Cup Medication Administration: Whole meds with liquid Supervision: Patient able to self feed Compensations: Slow rate;Small sips/bites Postural Changes: Seated upright at 90 degrees;Remain upright for at least 30 minutes after po intake    Other  Recommendations Oral Care Recommendations: Oral care BID   Follow up Recommendations None      Frequency and Duration     N/A       Prognosis    N/A    Swallow Study   General Date of Onset: 11/11/20 HPI: Patient is a 87 y.o. male with PMH: GERD (EGD July 2022 showing large hiatal hernia, gastritis, non-severe candidiasis esophagitis with no bleeding), stage III chronic kidney disease, coronary artery disease with history of first-degree block, hypertension, history of prostate cancer. he presented to hospital after a fall just prior to presentation. CT head negative, hip xray shows fracture  of right hip. Type of Study: Bedside Swallow Evaluation Previous Swallow Assessment: EGD 10/03/20 Diet Prior to this Study: Regular;Thin liquids Temperature Spikes Noted: No Respiratory Status: Room air History of Recent Intubation: No Behavior/Cognition: Alert;Cooperative;Pleasant mood Oral Cavity Assessment: Within Functional Limits Oral Care Completed by SLP: No Oral Cavity - Dentition: Adequate natural dentition Vision: Functional for self-feeding Self-Feeding Abilities: Able to feed self Patient Positioning: Upright in bed Baseline Vocal Quality: Low vocal intensity Volitional Cough: Strong Volitional Swallow: Able to elicit    Oral/Motor/Sensory Function Overall Oral Motor/Sensory Function: Within functional limits   Ice Chips     Thin Liquid Thin Liquid: Within functional limits Presentation: Straw;Self Fed    Nectar Thick Nectar Thick Liquid: Not tested   Honey Thick Honey Thick Liquid: Not tested   Puree Puree: Not tested   Solid     Solid: Not tested      Sonia Baller, MA, CCC-SLP Speech Therapy [facility]

## 2020-11-13 NOTE — Anesthesia Preprocedure Evaluation (Addendum)
Anesthesia Evaluation  Patient identified by MRN, date of birth, ID band Patient awake    Reviewed: Allergy & Precautions, NPO status , Patient's Chart, lab work & pertinent test results  History of Anesthesia Complications Negative for: history of anesthetic complications  Airway Mallampati: III  TM Distance: >3 FB Neck ROM: Full    Dental  (+) Dental Advisory Given, Partial Lower, Partial Upper, Chipped, Missing   Pulmonary sleep apnea ,    Pulmonary exam normal breath sounds clear to auscultation       Cardiovascular Exercise Tolerance: Good hypertension, Pt. on medications + angina with exertion + CAD, + Past MI (elevated troponins ), + CABG and + Peripheral Vascular Disease  + dysrhythmias + Valvular Problems/Murmurs AS, MR and AI  Rhythm:Regular Rate:Normal + Systolic murmurs Echo [id number]  1. Distal septal hyokinesis. Left ventricular ejection fraction, by estimation, is 50 to 55%. The left ventricle has low normal function. The left ventricle demonstrates regional wall motion abnormalities (see scoring diagram/findings for description). There is mild left ventricular hypertrophy. Left ventricular diastolic parameters are indeterminate.  2. Right ventricular systolic function is normal. The right ventricular size is normal. There is mildly elevated pulmonary artery systolic pressure.  3. Left atrial size was moderately dilated.  4. The mitral valve is abnormal. Mild mitral valve regurgitation. No evidence of mitral stenosis. Moderate mitral annular calcification.  5. The aortic valve is tricuspid. There is moderate calcification of the aortic valve. There is moderate thickening of the aortic valve. Aortic valve regurgitation is mild. Moderate aortic valve stenosis.  6. The inferior vena cava is normal in size with greater than 50% respiratory variability, suggesting right atrial pressure of 3 mmHg.    Neuro/Psych   Neuromuscular disease (Parkinson's disease?) CVA, No Residual Symptoms negative psych ROS   GI/Hepatic Neg liver ROS, GERD  Medicated and Controlled,  Endo/Other  negative endocrine ROS  Renal/GU Renal InsufficiencyRenal disease   Prostate cancer    Musculoskeletal  (+) Arthritis  (back sx),   Abdominal   Peds  Hematology  (+) anemia ,   Anesthesia Other Findings Right hand weakness after back sx Parkinson's disease?  Reproductive/Obstetrics negative OB ROS                            Anesthesia Physical  Anesthesia Plan  ASA: 4  Anesthesia Plan: General   Post-op Pain Management:    Induction: Intravenous  PONV Risk Score and Plan: 3 and Ondansetron, Dexamethasone and Treatment may vary due to age or medical condition  Airway Management Planned: Oral ETT  Additional Equipment: None  Intra-op Plan:   Post-operative Plan: Possible Post-op intubation/ventilation  Informed Consent: I have reviewed the patients History and Physical, chart, labs and discussed the procedure including the risks, benefits and alternatives for the proposed anesthesia with the patient or authorized representative who has indicated his/her understanding and acceptance.     Dental advisory given  Plan Discussed with: CRNA  Anesthesia Plan Comments:        Anesthesia Quick Evaluation

## 2020-11-13 NOTE — Consult Note (Signed)
ORTHOPAEDIC CONSULTATION  REQUESTING PHYSICIAN: Arrien, Jimmy Picket,*  Time called na Time arrived na  Chief Complaint: R hip fx  HPI: I have reviewed and agree with below history:  Jon Reid is a 87 y.o. male with a history of stage III chronic kidney disease, coronary artery disease with history of first-degree block, GERD, hypertension, history of prostate cancer.  Patient presents after a fall just prior to presentation.  Patient landed on his right hip and had immediate pain.  Pain increased with movement and improved with rest.  No other palliating or provoking factors.  He was brought to the emergency department for evaluation.  His pain is stable.  He denies chest pain, shortness of breath, wheezing, abdominal pain.  He did not hit his head  Past Medical History:  Diagnosis Date   Arthritis    Carcinoma of prostate ([place])    prostate   CKD (chronic kidney disease), stage III (HCC)    Coronary artery disease    a. 10/2012 Cath: LM 20-30d, lAD 80p, 53m 80-90d, LCX 90-986mOM1 90, RCA 50p, 2064m0d, RPDA 90, EF nl; b. 10/2012 CABG x 4 (LIMA-LAD, SVG-OM1, SVG-PDA->RPL); c. 07/2015 MV: Basal and proximal septal infarct w/o ischemia. EF 57%-->low risk.   First degree AV block 01/28/2019   Noted on EKG   GERD (gastroesophageal reflux disease)    Gout    HTN (hypertension)    Macrocytosis without anemia 01/08/2012   Moderate aortic stenosis    a. 10/2012 Echo: EF 55-60%, no rwma, BAE, mild AI, mild TR; b. 06/2020 Echo: EF 50-55%, no rwma, mild LVH. Mod dil LA. Mild MR. Mod AS.   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   Sleep apnea    Questionable   Spinal stenosis    [facility]  Past [facility] History:  Procedure Laterality Date   BALLOON DILATION N/A 10/03/2020   Procedure: BALLOON DILATION;  Surgeon: CarEloise HarmanO;  [facility];  Service: Endoscopy;  Laterality:  N/A;   CARDIAC CATHETERIZATION  11/07/2012   Dr NahAcie FredricksonCATARACT EXTRACTION W/ INTRAOCULAR LENS IMPLANT Bilateral    CORONARY ARTERY BYPASS GRAFT N/A 11/11/2012   Procedure: CORONARY ARTERY BYPASS GRAFTING times four using Right Greater Saphenous Vein Graft harvested endoscopically and Left Internal Mammary Artery.;  Surgeon: PetIvin PootD;  [facility] [place];  Laterality: N/A;   ESOPHAGEAL BRUSHING  10/03/2020   Procedure: ESOPHAGEAL BRUSHING;  Surgeon: CarEloise HarmanO;  [facility];  Service: Endoscopy;;   ESOPHAGOGASTRODUODENOSCOPY (EGD) WITH PROPOFOL N/A 06/27/2020   Surgeon: CarEloise HarmanO;  large hiatal hernia, LA grade D esophagitis with no bleeding, normal examined duodenum.    ESOPHAGOGASTRODUODENOSCOPY (EGD) WITH PROPOFOL N/A 10/03/2020   Procedure: ESOPHAGOGASTRODUODENOSCOPY (EGD) WITH PROPOFOL;  Surgeon: CarEloise HarmanO;  [facility];  Service: Endoscopy;  Laterality: N/A;  2:00pm   INGUINAL HERNIA REPAIR Right 01/10/2017   Procedure: OPEN RIGHT HERNIA REPAIR INGUINAL;  Surgeon: WhiIleana RoupD;  [facility];  Service: General;  Laterality: Right;   INSERTION OF MESH Right 01/10/2017   Procedure: INSERTION OF MESH;  Surgeon: WhiIleana RoupD;  [facility];  Service: General;  Laterality: Right;   INTRAOPERATIVE TRANSESOPHAGEAL ECHOCARDIOGRAM N/A 11/11/2012   Procedure: INTRAOPERATIVE TRANSESOPHAGEAL ECHOCARDIOGRAM;  Surgeon:  Ivin Poot, MD;  Location: [place];  Service: Open Heart Surgery;  Laterality: N/A;   LEFT HEART CATHETERIZATION WITH CORONARY ANGIOGRAM N/A 11/07/2012   Procedure: LEFT HEART CATHETERIZATION WITH CORONARY ANGIOGRAM;  Surgeon: Thayer Headings, MD;  Location: [facility] CATH LAB;  Service: Cardiovascular;  Laterality: N/A;   LUMBAR LAMINECTOMY/DECOMPRESSION MICRODISCECTOMY N/A 02/05/2019   Procedure: LUMBAR LAMINECTOMY/DECOMPRESSION L3-L4;  Surgeon: Latanya Maudlin, MD;   Location: [facility];  Service: Orthopedics;  Laterality: N/A;  10mn   LYMPH NODE DISSECTION     Bilateral pelvic   RETROPUBIC PROSTATECTOMY     Radical   Social History   Socioeconomic History   Marital status: Married    Spouse name: [name]  Number of children: 3   Years of education: 12   Highest education level: High school graduate  Occupational History   Occupation: Retired  Tobacco Use   Smoking status: Never   Smokeless tobacco: Never  VScientific laboratory technicianUse: Never used  Substance and Sexual Activity   Alcohol use: Yes    Comment: Rare   Drug use: No   Sexual activity: Not Currently  Other Topics Concern   Not on file  Social History Narrative   Lives at home with wife/sons will stay with them occassionally   Married with 3 children   Denies caffeine use    Social Determinants of HRadio broadcast assistantStrain: Not on file  Food Insecurity: Not on file  Transportation Needs: Not on file  Physical Activity: Not on file  Stress: Not on file  Social Connections: Not on file   Family History  Problem Relation Age of Onset   Aneurysm Father        Cerebral   Stroke Father    Hypertension Father    Cancer Mother        colon   Parkinson's disease Son    Allergies  Allergen Reactions   Crestor [Rosuvastatin] Swelling    GYNECOMASTIA    Prior to Admission medications   Medication Sig Start Date End Date Taking? Authorizing Provider  ascorbic acid (VITAMIN C) 500 MG tablet Take 1 tablet (500 mg total) by mouth daily. 06/30/20  Yes MBarton Dubois MD  aspirin EC 81 MG tablet Take 81 mg by mouth daily.   Yes [provider]  carbidopa-levodopa (SINEMET IR) 25-100 MG tablet Take 1.5 tablets by mouth 3 (three) times daily. 09/21/20  Yes WKathrynn Ducking MD  cephALEXin (KEFLEX) 500 MG capsule Take 1 capsule (500 mg total) by mouth 2 (two) times daily. 10/31/20  Yes MGwenlyn Perking FNP  fluticasone (FLONASE) 50 MCG/ACT nasal spray One to 2 sprays  each nostril at bedtime Patient taking differently: Place 2 sprays into both nostrils at bedtime. 10/12/14  Yes MChipper Herb MD  furosemide (LASIX) 40 MG tablet TAKE 1 TABLET DAILY Patient taking differently: Take 40 mg by mouth daily. 10/10/20  Yes SClaretta Fraise MD  Iron, Ferrous Sulfate, 325 (65 Fe) MG TABS Take 1 tablet by mouth daily. Patient taking differently: Take 325 mg by mouth daily. 06/29/20  Yes MBarton Dubois MD  nitroGLYCERIN (NITROSTAT) 0.4 MG SL tablet Place 1 tablet (0.4 mg total) under the tongue every 5 (five) minutes as needed for chest pain. 07/20/15  Yes Kilroy, Luke K, PA-C  pantoprazole (PROTONIX) 40 MG tablet Take 1 tablet (40 mg total) by mouth 2 (two) times daily. TAKE 1 TABLET ONCE DAILY FOR REFLUX Patient taking differently: Take 40  mg by mouth 2 (two) times daily. 06/29/20  Yes Barton Dubois, MD  triamcinolone cream (KENALOG) 0.1 % Apply 1 application topically daily as needed (itching). 06/29/20  Yes Barton Dubois, MD  zinc sulfate 220 (50 Zn) MG capsule TAKE ONE CAPSULE ONCE DAILY Patient taking differently: Take 220 mg by mouth daily. 08/23/20  Yes Claretta Fraise, MD  Cholecalciferol (VITAMIN D) 2000 units CAPS Take 2,000 Units by mouth daily. Patient not taking: No sig reported    [provider]   CT HEAD WO CONTRAST (5MM)  Result Date: 11/11/2020 CLINICAL DATA:  Fall. EXAM: CT HEAD WITHOUT CONTRAST TECHNIQUE: Contiguous axial images were obtained from the base of the skull through the vertex without intravenous contrast. COMPARISON:  MRI brain dated November 02, 2019. FINDINGS: Brain: No evidence of acute infarction, hemorrhage, hydrocephalus, extra-axial collection or mass lesion/mass effect. Stable moderate atrophy and mild chronic microvascular ischemic changes. Vascular: Atherosclerotic vascular calcification of the carotid siphons. No hyperdense vessel. Skull: Normal. Negative for fracture or focal lesion. Sinuses/Orbits: No acute finding. Other: None.  IMPRESSION: 1. No acute intracranial abnormality. 2. Stable moderate atrophy and mild chronic microvascular ischemic changes. Electronically Signed   By: Titus Dubin M.D.   On: 11/11/2020 19:47   DG Hip Unilat W or Wo Pelvis 2-3 Views Right  Result Date: 11/11/2020 CLINICAL DATA:  Fall, right hip pain EXAM: DG HIP (WITH OR WITHOUT PELVIS) 2-3V RIGHT COMPARISON:  None. FINDINGS: Subcapital right hip fracture with varus angulation and foreshortening. Visualized bony pelvis appears intact. Old left inferior pubic ramus fracture. Left hip joint space is preserved. IMPRESSION: Subcapital right hip fracture, as above. Electronically Signed   By: Julian Hy M.D.   On: 11/11/2020 20:08    Positive ROS: All other systems have been reviewed and were otherwise negative with the exception of those mentioned in the HPI and as above.  Labs cbc Recent Labs    11/11/20 1917 11/12/20 0416  WBC 12.4* 9.2  HGB 11.1* 8.9*  HCT 34.7* 28.0*  PLT 389 328    Labs inflam No results for input(s): CRP in the last 72 hours.  Invalid input(s): ESR  Labs coag Recent Labs    11/12/20 0416  INR 1.2    Recent Labs    11/11/20 1917 11/12/20 0416  NA 138 140  K 3.3* 3.2*  CL 102 105  CO2 25 26  GLUCOSE 111* 106*  BUN 19 20  CREATININE 1.41* 1.30*  CALCIUM 8.8* 8.5*    Physical Exam: Vitals:   11/12/20 1553 11/12/20 2056  BP: (!) 110/43 (!) 101/48  Pulse: 82 76  Resp: 16 16  Temp: 98.6 F (37 C) 99.9 F (37.7 C)  SpO2: 95% 94%   General: Alert, no acute distress Cardiovascular: No pedal edema Respiratory: No cyanosis, no use of accessory musculature GI: No organomegaly, abdomen is soft and non-tender Skin: No lesions in the area of chief complaint other than those listed below in MSK exam.  Neurologic: Sensation intact distally save for the below mentioned MSK exam Psychiatric: Patient is competent for consent with normal mood and affect Lymphatic: No axillary or cervical  lymphadenopathy  MUSCULOSKELETAL:  RLE: NVI, pain with ROM at the hip Other extremities are atraumatic with painless ROM and NVI.  Assessment: R hip fem neck fx  Plan: R hip hemi arthroplasty today I have discussed R&B with patient and family and they wish to procede   Renette Butters, MD    11/13/2020 7:46 AM

## 2020-11-13 NOTE — Progress Notes (Signed)
Patient transferred to OR awake,alert/orientedx4. Consents in chart and handoff given to CRNA at bedside.

## 2020-11-13 NOTE — Transfer of Care (Signed)
Immediate Anesthesia Transfer of Care Note  Patient: Jon Reid  Procedure(s) Performed: ARTHROPLASTY BIPOLAR HIP (HEMIARTHROPLASTY) (Right: Hip)  Patient Location: PACU  Anesthesia Type:General  Level of Consciousness: drowsy, patient cooperative and responds to stimulation  Airway & Oxygen Therapy: Patient Spontanous Breathing and Patient connected to face mask oxygen  Post-op Assessment: Report given to RN, Post -op Vital signs reviewed and stable and Patient moving all extremities X 4  Post vital signs: Reviewed and stable  Last Vitals:  Vitals Value Taken Time  BP 117/47 11/13/20 1022  Temp 36.8 C 11/13/20 1022  Pulse 64 11/13/20 1028  Resp 17 11/13/20 1028  SpO2 100 % 11/13/20 1028  Vitals shown include unvalidated device data.  Last Pain:  Vitals:   11/13/20 0800  TempSrc:   PainSc: 0-No pain         Complications: No notable events documented.

## 2020-11-13 NOTE — Progress Notes (Addendum)
PROGRESS NOTE    Jon Reid  [id number] DOB: 22-Nov-1928 DOA: 11/11/2020 PCP: Claretta Fraise, MD    Brief Narrative:  Jon Reid was admitted to the hospital with the working diagnosis of right femur fracture.  Incidental COVID 19 positive.  87 year old male past medical history for chronic kidney disease stage III, coronary artery disease, first-degree AV block, GERD, hypertension and prostate cancer who presented with right hip pain.  Patient sustained a mechanical fall, landing on his right hip, he developed excruciating pain that prompted him to come to the hospital.  His initial physical examination blood pressure 119/68, heart rate 81, temperature 98.2, respiratory rate 20, oxygen saturation 100%, his lungs are clear to auscultation bilaterally, heart S1-S2, present, rhythmic, soft abdomen, right leg was shortened and externally rotated.  Sodium 138, potassium 3.3, chloride 102, bicarb 25, glucose 111, BUN 19, creatinine 1.41, white count 12.4, hemoglobin 11.1, hematocrit 34.7, platelets 389. SARS COVID-19 positive 11/11/20.  SARS COVID-19 positive June 25, 2020  Urinalysis specific gravity 1.011, negative leukocytes.  Head CT negative for acute changes, stable moderate atrophy with mild chronic microvascular ischemic changes.  Right hip radiograph with subcapital right hip fracture.  08/28 patient underwent hemiarthroplasty of the right hip with good toleration.  Assessment & Plan:   Active Problems:   Benign essential hypertension   Peripheral vascular disease (HCC)   Chronic renal insufficiency, stage III (moderate) (HCC)   Coronary artery disease involving native coronary artery of native heart without angina pectoris   Gastroesophageal reflux disease with esophagitis   Goals of care, counseling/discussion   Femur fracture, right (HCC)   Closed right hip fracture, initial encounter ([place])   Right hip fracture. Patient sp hemiarthroplasty, with good toleration,  post operative with no significant pain.  Continue pain control and dvt prophylaxis Follow with orthopedic surgery, PT and OT recommendations  2. COVID 19 positive. No signs of active infection, old records reviewed, patient tested positive for COVID 19 on 04/22. Continue with isolation per protocol.  Not indication for antiviral therapy or systemic corticosteroids.   3. CKD stage 3 b/ hypokalemia. Renal function with serum cr at 1,31 with K at 3,7 and serum bicarbonate at 26. Mg is 1,8.  Anemia of chronic renal disease, Hgb is 9,3 and Hct at 29,4. No current indication for PRBC transfusion.   Plan to continue close follow up on renal function and electrolytes, avoid hypotension or nephrotoxic medications.   5. HTN, Hx of CVA, CAD (CABG 2017).  Blood pressure well controlled. Continue asapirin.   6. Parkinsonism, moderate protein calorie protein malnutrition Mild confusion but not agitation Continue neuro checks per unit protocol.  Patient on Sinemet tid  Patient continue to be at high risk for perioperative medical complications   Status is: Inpatient  Remains inpatient appropriate because:Inpatient level of care appropriate due to severity of illness  Dispo: The patient is from: Home              Anticipated d/c is to: SNF              Patient currently is not medically stable to d/c.   Difficult to place patient No   DVT prophylaxis: Enoxaparin   Code Status:    full  Family Communication:      Consultants:  Orthopedics   Procedures:  Right hip hemiarthroplasty     Subjective: Patient post surgery, no apparent pain, denies any dyspnea or chest pain, no nausea or vomiting.   Objective: Vitals:  11/12/20 0800 11/12/20 1201 11/12/20 1553 11/12/20 2056  BP: (!) 103/42 134/63 (!) 110/43 (!) 101/48  Pulse: 66 74 82 76  Resp:  '16 16 16  '$ Temp:  99 F (37.2 C) 98.6 F (37 C) 99.9 F (37.7 C)  TempSrc:  Oral Oral   SpO2:  94% 95% 94%  Weight:      Height:         Intake/Output Summary (Last 24 hours) at 11/13/2020 1013 Last data filed at 11/13/2020 0949 Gross per 24 hour  Intake 1100 ml  Output 500 ml  Net 600 ml   Filed Weights   11/12/20 0500  Weight: 68.8 kg    Examination:   General: Not in pain or dyspnea, deconditioned  Neurology: Awake and alert, non focal, confused and disorientated but not agitated.   E ENT: mild pallor, no icterus, oral mucosa moist Cardiovascular: No JVD. S1-S2 present, rhythmic, no gallops, rubs, or murmurs. No lower extremity edema. Pulmonary: positive breath sounds bilaterally, adequate air movement, no wheezing, rhonchi or rales. Gastrointestinal. Abdomen soft and non tender Skin. No rashes Musculoskeletal: no joint deformities     Data Reviewed: I have personally reviewed following labs and imaging studies  CBC: Recent Labs  Lab 11/08/20 1553 11/11/20 1917 11/12/20 0416 11/13/20 0718  WBC 7.4 12.4* 9.2 11.6*  NEUTROABS 4.0 10.5*  --   --   HGB 9.7* 11.1* 8.9* 9.3*  HCT 29.7* 34.7* 28.0* 29.4*  MCV 94 99.7 100.0 98.7  PLT 408 389 328 [id number]   Basic Metabolic Panel: Recent Labs  Lab 11/08/20 1553 11/11/20 1917 11/12/20 0416 11/13/20 0718  NA 142 138 140 138  K 3.6 3.3* 3.2* 3.7  CL 104 102 105 105  CO2 '24 25 26 26  '$ GLUCOSE 120* 111* 106* 123*  BUN '17 19 20 14  '$ CREATININE 1.46* 1.41* 1.30* 1.31*  CALCIUM 8.9 8.8* 8.5* 8.3*  MG  --   --   --  1.8   GFR: Estimated Creatinine Clearance: 36.5 mL/min (A) (by C-G formula based on SCr of 1.31 mg/dL (H)). Liver Function Tests: Recent Labs  Lab 11/11/20 1917  AST 28  ALT 15  ALKPHOS 84  BILITOT 0.7  PROT 7.3  ALBUMIN 4.1   No results for input(s): LIPASE, AMYLASE in the last 168 hours. No results for input(s): AMMONIA in the last 168 hours. Coagulation Profile: Recent Labs  Lab 11/12/20 0416  INR 1.2   Cardiac Enzymes: No results for input(s): CKTOTAL, CKMB, CKMBINDEX, TROPONINI in the last 168 hours. BNP (last 3  results) No results for input(s): PROBNP in the last 8760 hours. HbA1C: No results for input(s): HGBA1C in the last 72 hours. CBG: No results for input(s): GLUCAP in the last 168 hours. Lipid Profile: No results for input(s): CHOL, HDL, LDLCALC, TRIG, CHOLHDL, LDLDIRECT in the last 72 hours. Thyroid Function Tests: No results for input(s): TSH, T4TOTAL, FREET4, T3FREE, THYROIDAB in the last 72 hours. Anemia Panel: No results for input(s): VITAMINB12, FOLATE, FERRITIN, TIBC, IRON, RETICCTPCT in the last 72 hours.    Radiology Studies: I have reviewed all of the imaging during this hospital visit personally     Scheduled Meds:  aspirin EC  81 mg Oral Daily   carbidopa-levodopa  1.5 tablet Oral TID   fentaNYL       fluticasone  2 spray Each Nare QHS   pantoprazole  40 mg Oral BID   Continuous Infusions:  ceFAZolin       LOS: 2  days        Saranne Crislip Gerome Apley, MD

## 2020-11-13 NOTE — Discharge Instructions (Signed)
You may bear weight as tolerated. Keep your dressing on and dry until follow up. Take medicine to prevent blood clots as directed. Take pain medicine as needed with the goal of transitioning to over the counter medicines.    INSTRUCTIONS AFTER PARTIAL JOINT REPLACEMENT   Remove items at home which could result in a fall. This includes throw rugs or furniture in walking pathways ICE to the affected joint every three hours while awake for 30 minutes at a time, for at least the first 3-5 days, and then as needed for pain and swelling.  Continue to use ice for pain and swelling. You may notice swelling that will progress down to the foot and ankle.  This is normal after surgery.  Elevate your leg when you are not up walking on it.   Continue to use the breathing machine you got in the hospital (incentive spirometer) which will help keep your temperature down.  It is common for your temperature to cycle up and down following surgery, especially at night when you are not up moving around and exerting yourself.  The breathing machine keeps your lungs expanded and your temperature down.   DIET:  As you were doing prior to hospitalization, we recommend a well-balanced diet.  DRESSING / WOUND CARE / SHOWERING  You may shower 3 days after surgery, but keep the dressing on and wounds dry during showering.  You may use an occlusive plastic wrap (Press'n Seal for example) with blue painter's tape at edges to cover it. NO SOAKING/SUBMERGING IN THE BATHTUB.  If the bandage gets wet or comes off, call the office.   ACTIVITY  Increase activity slowly as tolerated, but follow the weight bearing instructions below.   No driving for 6 weeks or until further direction given by your physician.  You cannot drive while taking narcotics.  No lifting or carrying greater than 10 lbs. until further directed by your surgeon. Avoid periods of inactivity such as sitting longer than an hour when not asleep. This helps  prevent blood clots.  You may return to work once you are authorized by your doctor.  Posterior Hip precautions.   - Do not cross legs, Pigeon toe, or bend past 90 degrees at waist. - Knee immobilizer only needed QHS to facilitate Posterior hip precautions.    WEIGHT BEARING   Weight bearing as tolerated with assist device (walker, cane, etc) as directed, use it as long as suggested by your surgeon or therapist, typically at least 4-6 weeks.   EXERCISES  Results after joint replacement surgery are often greatly improved when you follow the exercise, range of motion and muscle strengthening exercises prescribed by your doctor. Safety measures are also important to protect the joint from further injury. Any time any of these exercises cause you to have increased pain or swelling, decrease what you are doing until you are comfortable again and then slowly increase them. If you have problems or questions, call your caregiver or physical therapist for advice.   Rehabilitation is important following a joint replacement. After just a few days of immobilization, the muscles of the leg can become weakened and shrink (atrophy).  These exercises are designed to build up the tone and strength of the thigh and leg muscles and to improve motion. Often times heat used for twenty to thirty minutes before working out will loosen up your tissues and help with improving the range of motion but do not use heat for the first two weeks following surgery (  sometimes heat can increase post-operative swelling).   These exercises can be done on a training (exercise) mat, on the floor, on a table or on a bed. Use whatever works the best and is most comfortable for you.    Use music or television while you are exercising so that the exercises are a pleasant break in your day. This will make your life better with the exercises acting as a break in your routine that you can look forward to.   Perform all exercises about fifteen  times, three times per day or as directed.  You should exercise both the operative leg and the other leg as well.  Exercises include:   Quad Sets - Tighten up the muscle on the front of the thigh (Quad) and hold for 5-10 seconds.   Straight Leg Raises - With your knee straight (if you were given a brace, keep it on), lift the leg to 60 degrees, hold for 3 seconds, and slowly lower the leg.  Perform this exercise against resistance later as your leg gets stronger.  Hamstring Strength:  Lying on your back, push your heel against the floor with your leg straight by tightening up the muscles of your buttocks.  Repeat, but this time bend your knee to a comfortable angle, and push your heel against the floor.  You may put a pillow under the heel to make it more comfortable if necessary.   A rehabilitation program following joint replacement surgery can speed recovery and prevent re-injury in the future due to weakened muscles. Contact your doctor or a physical therapist for more information on knee rehabilitation.    CONSTIPATION  Constipation is defined medically as fewer than three stools per week and severe constipation as less than one stool per week.  Even if you have a regular bowel pattern at home, your normal regimen is likely to be disrupted due to multiple reasons following surgery.  Combination of anesthesia, postoperative narcotics, change in appetite and fluid intake all can affect your bowels.   YOU MUST use at least one of the following options; they are listed in order of increasing strength to get the job done.  They are all available over the counter, and you may need to use some, POSSIBLY even all of these options:    - Drink plenty of fluids (prune juice may be helpful) and high fiber foods - Colace 100 mg by mouth twice a day  - Senokot for constipation as directed and as needed Dulcolax (bisacodyl), take with full glass of water  - Miralax (polyethylene glycol) once or twice a day  as needed.  If you have tried all these things and are unable to have a bowel movement in the first 3-4 days after surgery call either your surgeon or your primary doctor.    If you experience loose stools or diarrhea, hold the medications until you stool forms back up.  If your symptoms do not get better within 1 week or if they get worse, check with your doctor.  If you experience "the worst abdominal pain ever" or develop nausea or vomiting, please contact the office immediately for further recommendations for treatment.   ITCHING:  If you experience itching with your medications, try taking only a single pain pill, or even half a pain pill at a time.  You can also use Benadryl over the counter for itching or also to help with sleep.   TED HOSE STOCKINGS:  Use stockings on both legs until  for at least 2 weeks or as directed by physician office. They may be removed at night for sleeping.  MEDICATIONS:  See your medication summary on the "After Visit Summary" that nursing will review with you.  You may have some home medications which will be placed on hold until you complete the course of blood thinner medication.  It is important for you to complete the blood thinner medication as prescribed.  Take medicines as prescribed.   You have several different medicines that work in different ways. - Tylenol is for mild to moderate pain. Try to take this medicine before turning to your narcotic medicines.  - Robaxin is for muscle spasms. This medicine can make you drowsy. - Tramadol is a narcotic pain medicine.  Take this for severe pain. This medicine can be dehydrating / constipating. - Zofran is for nausea and vomiting. - Aspirin is to prevent blood clots after surgery. YOU MUST TAKE THIS MEDICINE!!   PRECAUTIONS:  If you experience chest pain or shortness of breath - call 911 immediately for transfer to the hospital emergency department.   If you develop a fever greater that 101 F, purulent  drainage from wound, increased redness or drainage from wound, foul odor from the wound/dressing, or calf pain - CONTACT YOUR SURGEON.                                                   FOLLOW-UP APPOINTMENTS:  If you do not already have a post-op appointment, please call the office [phone] for an appointment to be seen by Dr. Percell Miller in 2 weeks.   OTHER INSTRUCTIONS:   MAKE SURE YOU:  Understand these instructions.  Get help right away if you are not doing well or get worse.    Thank you for letting us be a part of your medical care team.  It is a privilege we respect greatly.  We hope these instructions will help you stay on track for a fast and full recovery!     POST-OPERATIVE OPIOID TAPER INSTRUCTIONS: It is important to wean off of your opioid medication as soon as possible. If you do not need pain medication after your surgery it is ok to stop day one. Opioids include: Codeine, Hydrocodone(Norco, Vicodin), Oxycodone(Percocet, oxycontin) and hydromorphone amongst others.  Long term and even short term use of opiods can cause: Increased pain response Dependence Constipation Depression Respiratory depression And more.  Withdrawal symptoms can include Flu like symptoms Nausea, vomiting And more Techniques to manage these symptoms Hydrate well Eat regular healthy meals Stay active Use relaxation techniques(deep breathing, meditating, yoga) Do Not substitute Alcohol to help with tapering If you have been on opioids for less than two weeks and do not have pain than it is ok to stop all together.  Plan to wean off of opioids This plan should start within one week post op of your joint replacement. Maintain the same interval or time between taking each dose and first decrease the dose.  Cut the total daily intake of opioids by one tablet each day Next start to increase the time between doses. The last dose that should be eliminated is the evening dose.

## 2020-11-13 NOTE — Anesthesia Postprocedure Evaluation (Signed)
Anesthesia Post Note  Patient: Jon Reid  Procedure(s) Performed: ARTHROPLASTY BIPOLAR HIP (HEMIARTHROPLASTY) (Right: Hip)     Patient location during evaluation: PACU Anesthesia Type: General Level of consciousness: sedated and patient cooperative Pain management: pain level controlled Vital Signs Assessment: post-procedure vital signs reviewed and stable Respiratory status: spontaneous breathing Cardiovascular status: stable Anesthetic complications: no   No notable events documented.  Last Vitals:  Vitals:   11/13/20 1107 11/13/20 1128  BP: (!) 121/54 (!) 121/51  Pulse: 62 63  Resp: 16   Temp: 36.7 C (!) 36.3 C  SpO2: 97% 98%    Last Pain:  Vitals:   11/13/20 1128  TempSrc: Oral  PainSc:                  Nolon Nations

## 2020-11-14 ENCOUNTER — Encounter (HOSPITAL_COMMUNITY): Payer: Self-pay | Admitting: Orthopedic Surgery

## 2020-11-14 DIAGNOSIS — I739 Peripheral vascular disease, unspecified: Secondary | ICD-10-CM

## 2020-11-14 DIAGNOSIS — Z96649 Presence of unspecified artificial hip joint: Secondary | ICD-10-CM

## 2020-11-14 LAB — BASIC METABOLIC PANEL
Anion gap: 7 (ref 5–15)
BUN: 18 mg/dL (ref 8–23)
CO2: 25 mmol/L (ref 22–32)
Calcium: 8.2 mg/dL — ABNORMAL LOW (ref 8.9–10.3)
Chloride: 104 mmol/L (ref 98–111)
Creatinine, Ser: 1.35 mg/dL — ABNORMAL HIGH (ref 0.61–1.24)
GFR, Estimated: 50 mL/min — ABNORMAL LOW (ref >60)
Glucose, Bld: 127 mg/dL — ABNORMAL HIGH (ref 70–99)
Potassium: 4 mmol/L (ref 3.5–5.1)
Sodium: 136 mmol/L (ref 135–145)

## 2020-11-14 LAB — CBC
HCT: 28.1 % — ABNORMAL LOW (ref 39.0–52.0)
Hemoglobin: 9 g/dL — ABNORMAL LOW (ref 13.0–17.0)
MCH: 31.4 pg (ref 26.0–34.0)
MCHC: 32 g/dL (ref 30.0–36.0)
MCV: 97.9 fL (ref 80.0–100.0)
Platelets: 276 10*3/uL (ref 150–400)
RBC: 2.87 MIL/uL — ABNORMAL LOW (ref 4.22–5.81)
RDW: 16.2 % — ABNORMAL HIGH (ref 11.5–15.5)
WBC: 15.4 10*3/uL — ABNORMAL HIGH (ref 4.0–10.5)
nRBC: 0 % (ref 0.0–0.2)

## 2020-11-14 MED ORDER — ADULT MULTIVITAMIN W/MINERALS CH
1.0000 | ORAL_TABLET | Freq: Every day | ORAL | Status: DC
  Administered 2020-11-14 – 2020-11-17 (×4): 1 via ORAL
  Filled 2020-11-14 (×4): qty 1

## 2020-11-14 MED ORDER — ENSURE ENLIVE PO LIQD
237.0000 mL | Freq: Three times a day (TID) | ORAL | Status: DC
  Administered 2020-11-14 – 2020-11-17 (×7): 237 mL via ORAL

## 2020-11-14 NOTE — Progress Notes (Addendum)
Subjective: Patient reports pain as mild. Tolerating diet. Foley still in place. Making urine. No CP, SOB. Some confusion/inability to collect his thoughts. Long pauses between questions and answers. Often unable to give reasonable answer. Rarely makes eye contact. Working with PT/OT on mobilizing OOB but not doing very well thus far. Wants to go home.  Objective:   VITALS:   Vitals:   11/14/20 0009 11/14/20 0450 11/14/20 0900 11/14/20 1500  BP: (!) 101/57 96/77 (!) 128/55 (!) 121/56  Pulse: 62 67 63 67  Resp: '17 18 18 18  '$ Temp: 97.7 F (36.5 C) 97.8 F (36.6 C) 98.5 F (36.9 C) 98.4 F (36.9 C)  TempSrc: Oral Oral Oral Oral  SpO2: 97% 99% 100% 98%  Weight:      Height:       CBC Latest Ref Rng & Units 11/14/2020 11/13/2020 11/12/2020  WBC 4.0 - 10.5 K/uL 15.4(H) 11.6(H) 9.2  Hemoglobin 13.0 - 17.0 g/dL 9.0(L) 9.3(L) 8.9(L)  Hematocrit 39.0 - 52.0 % 28.1(L) 29.4(L) 28.0(L)  Platelets 150 - 400 K/uL 276 298 328   BMP Latest Ref Rng & Units 11/14/2020 11/13/2020 11/12/2020  Glucose 70 - 99 mg/dL 127(H) 123(H) 106(H)  BUN 8 - 23 mg/dL '18 14 20  '$ Creatinine 0.61 - 1.24 mg/dL 1.35(H) 1.31(H) 1.30(H)  BUN/Creat Ratio 10 - 24 - - -  Sodium 135 - 145 mmol/L 136 138 140  Potassium 3.5 - 5.1 mmol/L 4.0 3.7 3.2(L)  Chloride 98 - 111 mmol/L 104 105 105  CO2 22 - 32 mmol/L '25 26 26  '$ Calcium 8.9 - 10.3 mg/dL 8.2(L) 8.3(L) 8.5(L)   Intake/Output      08/28 0701 08/29 0700 08/29 0701 08/30 0700   P.O. 0 240   I.V. (mL/kg) 1100 (16)    IV Piggyback 100    Total Intake(mL/kg) 1200 (17.4) 240 (3.5)   Urine (mL/kg/hr) 300 (0.2) 200 (0.3)   Blood 100    Total Output 400 200   Net +800 +40        Urine Occurrence 1 x       Physical Exam: General: NAD. Lying in bed finishing dinner, watching tv. Calm, comfortable Resp: No increased wob Cardio: regular rate and rhythm ABD soft Neurologically intact MSK Neurovascularly intact Sensation intact distally Intact pulses  distally Dorsiflexion/Plantar flexion intact TTP R hip Incision: dressing C/D/I KI RLE   Assessment: 1 Day Post-Op  S/P Procedure(s) (LRB): ARTHROPLASTY BIPOLAR HIP (HEMIARTHROPLASTY) (Right) by Dr. Ernesta Amble. Percell Miller on 11/13/20  Active Problems:   Benign essential hypertension   Peripheral vascular disease (HCC)   Chronic renal insufficiency, stage III (moderate) (HCC)   Coronary artery disease involving native coronary artery of native heart without angina pectoris   Gastroesophageal reflux disease with esophagitis   Goals of care, counseling/discussion   Femur fracture, right (HCC)   Closed right hip fracture, initial encounter ([place])   S/P hip hemiarthroplasty   Plan: Posterior hip precautions Advance diet Up with therapy Incentive Spirometry Elevate and Apply ice Foley is still in place. Unsure why. Likely due to poor mobilization to [facility] or bathroom.   Low Vit D level. Primary team may want to consider Vit D supplementation to aid healing  Weightbearing: WBAT RLE Insicional and dressing care: Dressings left intact until follow-up and Reinforce dressings as needed Orthopedic device(s):  KI to be worn when in bed or chair, can remove for PT/OT sessions Showering: POD 3, Keep dressing dry VTE prophylaxis: Lovenox '40mg'$  qd  while  inpatient , can switch to ASA '81mg'$  bid x 30 days upon d/c, SCDs, ambulation Pain control: Continue current regimen. Not having much pain Follow - up plan: 2 weeks post-op in the office Contact information:  Edmonia Lynch MD, Aggie Moats PA-C  Dispo:  TBD based on PT/OT evals . Patient lives with his wife in their own home. Previous notes mention his wife is worried about him coming home right away and her ability to care for him. Due to cognition deficits and poor mobilization, he may very likely need to go to a SNF or CIR to recover. Will continue to follow.    Britt Bottom, PA-C Office [phone] 11/14/2020, 3:50 PM

## 2020-11-14 NOTE — Progress Notes (Signed)
Occupational Therapy Evaluation Patient Details Name: Jon Reid MRN: [id number] DOB: 08-06-1928 Today's Date: 11/14/2020    History of Present Illness Jon Reid is an 87 y.o. male who was admitted 8/26 after a fall in his kitchen. Pt found with R proximal femur fracture and is now s/p hemiarthroplasty of R hip 7/28. Past medical history of stage III chronic kidney disease, coronary artery disease with   Clinical Impression   Jon Reid was evaluated s/p the above fall and femur fracture. PTA pt was indep in all ADL/IADLs however seems to be a limited historian. Pt reports he was walking "laps" in [place] and driving and the proceeded to say his drivers licenses did not get renewed and he has been "sick," lost 35lbs and has had multiple falls recently. When asked why he does not want to use AD/DME, he stated because he does not want to lose his independence. Pt was max A +2 for all mobility (bed, sit<>stand and SP) tasks with RW. He also required max verbal and tactile cues for all sequencing and was observed with pauses and freezing during funcitonal tasks. Pt would benefit from continued OT acutely. Recommend intensive therapies at the CIR level to progress back towards his mod I baseline prior to d/c home.    Follow Up Recommendations  CIR    Equipment Recommendations  3 in 1 bedside commode;Tub/shower bench    Recommendations for Other Services Rehab consult     Precautions / Restrictions Precautions Precautions: Posterior Hip;Fall Precaution Booklet Issued: No Precaution Comments: verbakky reviewed Required Braces or Orthoses: Knee Immobilizer - Right Knee Immobilizer - Right: Other (comment) (On while in bed/resting, off for PT/OT) Restrictions Weight Bearing Restrictions: Yes RLE Weight Bearing: Weight bearing as tolerated Other Position/Activity Restrictions: post hip      Mobility Bed Mobility Overal bed mobility: Needs Assistance Bed Mobility: Supine to Sit      Supine to sit: Max assist;+2 for physical assistance;+2 for safety/equipment     General bed mobility comments: Max A +2 to move RLE towards EOB guide hips towards EOB with use of chuck pad and to elevate trunk. Pt demonstrated severe posterior lean throughout process    Transfers Overall transfer level: Needs assistance Equipment used: Rolling walker (2 wheeled);2 person hand held assist Transfers: Sit to/from [employer] Sit to Stand: Max assist;+2 physical assistance;+2 safety/equipment;From elevated surface Stand pivot transfers: Max assist;+2 safety/equipment;+2 physical assistance       General transfer comment: max A +2 to power up into standing, max verbal cues for hand placement, standing posture and weight shifting to RLE. Pt also required max A +2 to weight shift and pivot with RW from [employer]    Balance Overall balance assessment: Needs assistance Sitting-balance support: Feet supported Sitting balance-Leahy Scale: Poor Sitting balance - Comments: pt required max verbal cues to lean forward to sustain sitting balance, intially pt observed with severe posterior lean and required max A assist to correct   Standing balance support: Bilateral upper extremity supported Standing balance-Leahy Scale: Poor Standing balance comment: BLE reliant on external support                           ADL either performed or assessed with clinical judgement   ADL Overall ADL's : Needs assistance/impaired Eating/Feeding: Independent;Sitting   Grooming: Set up;Sitting   Upper Body Bathing: Set up;Sitting   Lower Body Bathing: Maximal assistance;+2 for safety/equipment;+2 for physical assistance;Sit to/from stand  Upper Body Dressing : Set up;Sitting                     General ADL Comments: Pt currently limited by pain, generalized weakness and problem solving     Vision Baseline Vision/History: 0 No visual deficits Ability to See in  Adequate Light: 0 Adequate Patient Visual Report:  (Pt reported that he sees 1 black floater)              Pertinent Vitals/Pain Pain Assessment: No/denies pain     Hand Dominance     Extremity/Trunk Assessment Upper Extremity Assessment Upper Extremity Assessment: Overall WFL for tasks assessed   Lower Extremity Assessment Lower Extremity Assessment: Defer to PT evaluation       Communication Communication Communication: HOH   Cognition Arousal/Alertness: Awake/alert Behavior During Therapy: Flat affect Overall Cognitive Status: Impaired/Different from baseline Area of Impairment: Orientation                 Orientation Level: Disoriented to;Time;Situation             General Comments: Orietned to person, place and month, stated it was 2012.   General Comments  VSS on RA, left O2 off. Wife present and supportive, states that she can offer supportive help but no physical assitance.     Home Living Family/patient expects to be discharged to:: Private residence Living Arrangements: Spouse/significant other;Children (has 3 sons that are local) Available Help at Discharge: Family Type of Home: House Home Access: Stairs to enter [employer] of Steps: 1 step in the back   Home Layout: Able to live on main level with bedroom/bathroom;Two level     Bathroom Shower/Tub: Teacher, early years/pre:  (unclear)     Home Equipment: Walker - 2 wheels;Cane - single point;Grab bars - tub/shower   Additional Comments: has lots of DME but does not use it ; has had 10-11 falls recently      Prior Functioning/Environment Level of Independence: Independent with assistive device(s)        Comments: Pt seems to be a poor historian with an altered timeline of events. However he states that he was walking "laps" in [employer], indep in all ADL/IADLs PTA. Then states he has been sick, lost 35lbs and has had multiple falls.        OT Problem List:  Decreased strength;Decreased range of motion;Decreased activity tolerance;Impaired balance (sitting and/or standing);Decreased safety awareness;Decreased knowledge of use of DME or AE;Decreased knowledge of precautions;Pain      OT Treatment/Interventions: Self-care/ADL training;Therapeutic exercise;DME and/or AE instruction;Therapeutic activities;Balance training;Patient/family education    OT Goals(Current goals can be found in the care plan section) Acute Rehab OT Goals Patient Stated Goal: get back to independence OT Goal Formulation: With patient Time For Goal Achievement: 11/28/20 Potential to Achieve Goals: Fair ADL Goals Pt Will Perform Lower Body Bathing: with min assist;sit to/from stand Pt Will Perform Lower Body Dressing: with min assist;sit to/from stand;with adaptive equipment Pt Will Transfer to Toilet: with min assist;squat pivot transfer;bedside commode Pt Will Perform Toileting - Clothing Manipulation and hygiene: with min assist;sit to/from stand Pt Will Perform Tub/Shower Transfer: with min assist;ambulating;tub bench;rolling walker Pt/caregiver will Perform Home Exercise Program: Increased strength;Both right and left upper extremity;Independently;With written HEP provided  OT Frequency: Min 2X/week   Barriers to D/C: Decreased caregiver support  pt lives with wife who requires rollator and states she is unable to physically assist pt. Pt does have 3 supportive sons  near by          AM-PAC OT "6 Clicks" Daily Activity     Outcome Measure Help from another person eating meals?: None Help from another person taking care of personal grooming?: A Little Help from another person toileting, which includes using toliet, bedpan, or urinal?: A Lot Help from another person bathing (including washing, rinsing, drying)?: A Lot Help from another person to put on and taking off regular upper body clothing?: A Little Help from another person to put on and taking off regular  lower body clothing?: A Lot 6 Click Score: 16   End of Session Equipment Utilized During Treatment: Rolling walker Nurse Communication: Mobility status;Precautions;Weight bearing status (condom cath removed)  Activity Tolerance: Patient tolerated treatment well;Patient limited by pain Patient left: in chair;with call bell/phone within reach;with family/visitor present  OT Visit Diagnosis: Other abnormalities of gait and mobility (R26.89);Repeated falls (R29.6);Muscle weakness (generalized) (M62.81);History of falling (Z91.81);Pain                Time: [id number] OT Time Calculation (min): 46 min Charges:  OT General Charges $OT Visit: 1 Visit OT Evaluation $OT Eval Moderate Complexity: 1 Mod OT Treatments $Therapeutic Activity: 23-37 mins    Hilma Steinhilber A Nahima Ales 11/14/2020, 3:47 PM

## 2020-11-14 NOTE — Progress Notes (Signed)
Initial Nutrition Assessment  DOCUMENTATION CODES:   Non-severe (moderate) malnutrition in context of chronic illness  INTERVENTION:   -Ensure Enlive po TID, each supplement provides 350 kcal and 20 grams of protein  -MVI with minerals daily  NUTRITION DIAGNOSIS:   Moderate Malnutrition related to chronic illness (GERD) as evidenced by mild fat depletion, moderate fat depletion, mild muscle depletion, moderate muscle depletion.  GOAL:   Patient will meet greater than or equal to 90% of their needs  MONITOR:   PO intake, Supplement acceptance, Labs, Weight trends, Skin, I & O's  REASON FOR ASSESSMENT:   Consult Assessment of nutrition requirement/status, Wound healing  ASSESSMENT:   Jon Reid is a 87 y.o. male with a history of stage III chronic kidney disease, coronary artery disease with history of first-degree block, GERD, hypertension, history of prostate cancer.  Patient presents after a fall just prior to presentation.  Patient landed on his right hip and had immediate pain.  Pain increased with movement and improved with rest.  No other palliating or provoking factors.  He was brought to the emergency department for evaluation.  His pain is stable.  He denies chest pain, shortness of breath, wheezing, abdominal pain.  He did not hit his head  Pt admitted with rt femur fracture.   8/28- s/p Procedure(s): ARTHROPLASTY BIPOLAR HIP (HEMIARTHROPLASTY); s/p BSE- advanced to regular diet with thin liquids   Reviewed I/O's: +800 ml x 24 hours and +150 ml since admission  UOP: 300 ml x 24 hours  Spoke with pt, who was sitting in recliner chair at time of visit. Pt reports he has a good appetite and consumed most of his breakfast and dinner meals. Noted meal completion 50%.   Per pt wife, pt is a very selective eater and has been for 75 years. Wife attributes decreased oral intake and weight loss (about 35 pounds over the past 2 months) secondary to bleeding ulcers  secondary to a medication side effect.   Discussed importance of good meal and supplement intake to promote healing. Pt consumed 2-3 Boost supplements daily PTA and is willing to consume them during hospitalization.  Medications reviewed and include sinemet and colace.   Labs reviewed.   NUTRITION - FOCUSED PHYSICAL EXAM:  Flowsheet Row Most Recent Value  Orbital Region Moderate depletion  Upper Arm Region Moderate depletion  Thoracic and Lumbar Region Mild depletion  Buccal Region Mild depletion  Temple Region Moderate depletion  Clavicle Bone Region Moderate depletion  Clavicle and Acromion Bone Region Moderate depletion  Scapular Bone Region Moderate depletion  Dorsal Hand Moderate depletion  Patellar Region Mild depletion  Anterior Thigh Region Mild depletion  Posterior Calf Region Mild depletion  Edema (RD Assessment) None  Hair Reviewed  Eyes Reviewed  Mouth Reviewed  Skin Reviewed  Nails Reviewed       Diet Order:   Diet Order             Diet Heart Room service appropriate? Yes; Fluid consistency: Thin  Diet effective now                   EDUCATION NEEDS:   Education needs have been addressed  Skin:  Skin Assessment: Skin Integrity Issues: Skin Integrity Issues:: Incisions Incisions: closed rt hip  Last BM:  11/11/20  Height:   Ht Readings from Last 1 Encounters:  11/12/20 6' (1.829 m)    Weight:   Wt Readings from Last 1 Encounters:  11/12/20 68.8 kg  Ideal Body Weight:  80.9 kg  BMI:  Body mass index is 20.57 kg/m.  Estimated Nutritional Needs:   Kcal:  2050-2250  Protein:  105-120 grams  Fluid:  > 2 L    Loistine Chance, RD, LDN, [place] Registered Dietitian II Certified Diabetes Care and Education Specialist Please refer to [facility] for RD and/or RD on-call/weekend/after hours pager

## 2020-11-14 NOTE — Evaluation (Signed)
Physical Therapy Evaluation Patient Details Name: Jon Reid MRN: [id number] DOB: 1928-09-21 Today's Date: 11/14/2020   History of Present Illness  Jon Reid is an 87 y.o. male who was admitted 8/26 after a fall in his kitchen. Pt found with R proximal femur fracture and is now s/p hemiarthroplasty of R hip 7/28. Past medical history of stage III chronic kidney disease, coronary artery disease with  Clinical Impression   Patient received in bed, pleasant and cooperative with therapy, but confused and tangential and does need frequent redirection. Required MaxAx2 and multimodal cuing for all aspects of mobility today, has heavy posterior lean and actually demonstrated Parkinsonian freezing episode when attempting to move from stand to sit at end of session. Has poor insight into deficits, and timeline of events from when he was healthier/walking longer distances to now is rather unclear. Left up in recliner with all needs met, chair alarm active and nursing staff aware of patient status. Might benefit from intensive therapies in CIR setting moving forward- will continue to follow.     Follow Up Recommendations CIR;Supervision/Assistance - 24 hour    Equipment Recommendations  Rolling walker with 5" wheels;3in1 (PT);Wheelchair (measurements PT);Wheelchair cushion (measurements PT)    Recommendations for Other Services       Precautions / Restrictions Precautions Precautions: Posterior Hip;Fall Precaution Booklet Issued: No Precaution Comments: verbally reviewed Required Braces or Orthoses: Knee Immobilizer - Right Knee Immobilizer - Right: Other (comment) (on while in bed or resting, off for PT/OT) Restrictions Weight Bearing Restrictions: Yes RLE Weight Bearing: Weight bearing as tolerated Other Position/Activity Restrictions: posterior hip precautions R LE      Mobility  Bed Mobility Overal bed mobility: Needs Assistance Bed Mobility: Supine to Sit     Supine to sit:  Max assist;+2 for physical assistance;+2 for safety/equipment     General bed mobility comments: Max A +2 to move RLE towards EOB guide hips towards EOB with use of chuck pad and to elevate trunk. Pt demonstrated severe posterior lean throughout process    Transfers Overall transfer level: Needs assistance Equipment used: Rolling walker (2 wheeled) Transfers: Sit to/from [facility] Sit to Stand: Max assist;+2 physical assistance;+2 safety/equipment;From elevated surface Stand pivot transfers: Max assist;+2 safety/equipment;+2 physical assistance       General transfer comment: max A +2 to power up into standing, max verbal cues for hand placement, standing posture and weight shifting to RLE. Pt also required max A +2 to weight shift and pivot with RW from [employer]  Ambulation/Gait             General Gait Details: deferred- safety/balance  Stairs            Wheelchair Mobility    Modified Rankin (Stroke Patients Only)       Balance Overall balance assessment: Needs assistance Sitting-balance support: Feet supported Sitting balance-Leahy Scale: Poor Sitting balance - Comments: pt required max verbal cues to lean forward to sustain sitting balance, intially pt observed with severe posterior lean and required max A assist to correct   Standing balance support: Bilateral upper extremity supported Standing balance-Leahy Scale: Poor Standing balance comment: BLE reliant on external support                             Pertinent Vitals/Pain Pain Assessment: No/denies pain    Home Living Family/patient expects to be discharged to:: Private residence Living Arrangements: Spouse/significant other;Children (has 3 sons that  are local) Available Help at Discharge: Family;Available PRN/intermittently Type of Home: House Home Access: Stairs to enter   [employer] of Steps: 1 step in the back Home Layout: Able to live on main level  with bedroom/bathroom;Two level Home Equipment: Walker - 2 wheels;Cane - single point;Grab bars - tub/shower Additional Comments: has lots of DME but does not use it ; has had 10-11 falls recently    Prior Function Level of Independence: Independent with assistive device(s)         Comments: Pt seems to be a poor historian with an altered timeline of events. However he states that he was walking "laps" in [employer], indep in all ADL/IADLs PTA. Then states he has been sick, lost 35lbs and has had multiple falls.     Hand Dominance        Extremity/Trunk Assessment   Upper Extremity Assessment Upper Extremity Assessment: Defer to OT evaluation    Lower Extremity Assessment Lower Extremity Assessment: Generalized weakness    Cervical / Trunk Assessment Cervical / Trunk Assessment: Kyphotic  Communication   Communication: HOH  Cognition Arousal/Alertness: Awake/alert Behavior During Therapy: Flat affect Overall Cognitive Status: Impaired/Different from baseline Area of Impairment: Orientation                 Orientation Level: Disoriented to;Time;Situation             General Comments: Oriented to person, place and month, stated it was 2012. Pleasant but can be tangential and needs redirection. Seems to have very poor insight into safety and overall deficits      General Comments General comments (skin integrity, edema, etc.): VSS on RA with O2 off; wife present and supportive and states she can offer supportive help but no physical assistance    Exercises     Assessment/Plan    PT Assessment Patient needs continued PT services  PT Problem List Decreased strength;Decreased cognition;Decreased knowledge of use of DME;Decreased activity tolerance;Decreased safety awareness;Decreased balance;Decreased mobility;Decreased coordination;Decreased knowledge of precautions       PT Treatment Interventions Balance training;Gait training;DME instruction;Stair  training;Cognitive remediation;Functional mobility training;Patient/family education;Therapeutic activities;Therapeutic exercise;Wheelchair mobility training    PT Goals (Current goals can be found in the Care Plan section)  Acute Rehab PT Goals Patient Stated Goal: get back to independence PT Goal Formulation: With patient Time For Goal Achievement: 11/28/20 Potential to Achieve Goals: Good    Frequency Min 3X/week   Barriers to discharge        Co-evaluation               AM-PAC PT "6 Clicks" Mobility  Outcome Measure Help needed turning from your back to your side while in a flat bed without using bedrails?: A Lot Help needed moving from lying on your back to sitting on the side of a flat bed without using bedrails?: Total Help needed moving to and from a bed to a chair (including a wheelchair)?: Total Help needed standing up from a chair using your arms (e.g., wheelchair or bedside chair)?: Total Help needed to walk in hospital room?: Total Help needed climbing 3-5 steps with a railing? : Total 6 Click Score: 7    End of Session Equipment Utilized During Treatment: Gait belt Activity Tolerance: Patient tolerated treatment well Patient left: in chair;with call bell/phone within reach;with chair alarm set Nurse Communication: Mobility status;Precautions;Weight bearing status;Need for lift equipment (stedy) PT Visit Diagnosis: Muscle weakness (generalized) (M62.81);Difficulty in walking, not elsewhere classified (R26.2);Other abnormalities of gait and mobility (  R26.89)    Time: 0335-3317 PT Time Calculation (min) (ACUTE ONLY): 46 min   Charges:   PT Evaluation $PT Eval Moderate Complexity: 1 Mod (co-eval with OT)         Ann Lions PT, DPT, PN2   Supplemental Physical Therapist [place]    Pager [phone] Acute Rehab Office [phone]

## 2020-11-14 NOTE — Progress Notes (Signed)
Infection Prevention  Unit: 5N Regarding: Covid  IP Recommendation: Pt asymptomatic Covid. Prior history of + Covid tests. Discussed with ID and it was determined that this was an older Covid infection. (CT value 32.4)

## 2020-11-14 NOTE — Progress Notes (Addendum)
PROGRESS NOTE    Jon Reid  [id number] DOB: 31-Mar-1928 DOA: 11/11/2020 PCP: Claretta Fraise, MD    Brief Narrative:  Mr. Cooprider was admitted to the hospital with the working diagnosis of right femur fracture.  Incidental COVID 19 positive (from prior infection).   87 year old male past medical history for chronic kidney disease stage III, coronary artery disease, first-degree AV block, GERD, hypertension and prostate cancer who presented with right hip pain.  Patient sustained a mechanical fall, landing on his right hip, he developed excruciating pain that prompted him to come to the hospital.  His initial physical examination blood pressure 119/68, heart rate 81, temperature 98.2, respiratory rate 20, oxygen saturation 100%, his lungs are clear to auscultation bilaterally, heart S1-S2, present, rhythmic, soft abdomen, right leg was shortened and externally rotated.   Sodium 138, potassium 3.3, chloride 102, bicarb 25, glucose 111, BUN 19, creatinine 1.41, white count 12.4, hemoglobin 11.1, hematocrit 34.7, platelets 389. SARS COVID-19 positive 11/11/20.  SARS COVID-19 positive June 25, 2020   Urinalysis specific gravity 1.011, negative leukocytes.   Head CT negative for acute changes, stable moderate atrophy with mild chronic microvascular ischemic changes.   Right hip radiograph with subcapital right hip fracture.   08/28 patient underwent hemiarthroplasty of the right hip with good toleration  Pending PT and OT evaluation for further disposition.   Assessment & Plan:   Active Problems:   Benign essential hypertension   Peripheral vascular disease (HCC)   Chronic renal insufficiency, stage III (moderate) (HCC)   Coronary artery disease involving native coronary artery of native heart without angina pectoris   Gastroesophageal reflux disease with esophagitis   Goals of care, counseling/discussion   Femur fracture, right (HCC)   Closed right hip fracture, initial  encounter ([place])   Right hip fracture. sp hemiarthroplasty. Post operative pain seems to be well controlled. Continue DVT prophylaxis.    Pending PT, OT further orthopedic recommendations.    2. COVID 19 positive. No signs of active infection, old records reviewed, patient tested positive for COVID 19 on 04/22.  CT value 32,4.  Will discontinue isolation per ID and infection prevention recommendations.    3. CKD stage 3 b/ hypokalemia.  Stable renal function with serum cr at 1,35 with K at 4,0 and serum bicarbonate at 25. Continue to avoid nephrotoxic medications and hypotension.   Anemia of chronic renal disease, Hgb is 9,0 and Hct at 28.1.    5. HTN, Hx of CVA, CAD (CABG 2017).  No chest pain, continue with as aspirin.    6. Parkinsonism, moderate protein calorie protein malnutrition Suspected cognitive impairment, continue neuro checks per unit protocol.  On Sinemet tid Follow with pT and oT recommendations.   7. Reactive leukocytosis. Wbc up to 15,4, no signs if systemic bacterial infection, will continue to hold on antibiotic therapy and follow up with cell count in am.   Status is: Inpatient  Remains inpatient appropriate because:Inpatient level of care appropriate due to severity of illness  Dispo: The patient is from: Home              Anticipated d/c is to:  to be determined               Patient currently is not medically stable to d/c.   Difficult to place patient No   DVT prophylaxis: Enoxaparin   Code Status:    full  Family Communication:  I spoke over the phone with the patient's son about patient's  condition, plan of care, prognosis and all questions were addressed.    Consultants:  Orthopedics   Procedures:   Right hemiarthroplasty   Subjective: Patient with no nausea or vomiting, no chest pain or dyspnea, positive right hip pain to movement.   Objective: Vitals:   11/13/20 1950 11/14/20 0009 11/14/20 0450 11/14/20 0900  BP: (!) 89/51 (!) 101/57  96/77 (!) 128/55  Pulse: 68 62 67 63  Resp: '18 17 18 18  '$ Temp: 97.8 F (36.6 C) 97.7 F (36.5 C) 97.8 F (36.6 C) 98.5 F (36.9 C)  TempSrc: Oral Oral Oral Oral  SpO2: 97% 97% 99% 100%  Weight:      Height:        Intake/Output Summary (Last 24 hours) at 11/14/2020 1138 Last data filed at 11/14/2020 0900 Gross per 24 hour  Intake 240 ml  Output 500 ml  Net -260 ml   Filed Weights   11/12/20 0500  Weight: 68.8 kg    Examination:   General: Not in pain or dyspnea.  Neurology: Awake and alert, non focal  E ENT: no pallor, no icterus, oral mucosa moist Cardiovascular: No JVD. S1-S2 present, rhythmic, no gallops, rubs, or murmurs. No lower extremity edema. Pulmonary: positive breath sounds bilaterally, adequate air movement, no wheezing, rhonchi or rales. Gastrointestinal. Abdomen soft and non tender Skin. No rashes Musculoskeletal: no joint deformities     Data Reviewed: I have personally reviewed following labs and imaging studies  CBC: Recent Labs  Lab 11/08/20 1553 11/11/20 1917 11/12/20 0416 11/13/20 0718 11/14/20 0738  WBC 7.4 12.4* 9.2 11.6* 15.4*  NEUTROABS 4.0 10.5*  --   --   --   HGB 9.7* 11.1* 8.9* 9.3* 9.0*  HCT 29.7* 34.7* 28.0* 29.4* 28.1*  MCV 94 99.7 100.0 98.7 97.9  PLT 408 389 328 298 [id number]   Basic Metabolic Panel: Recent Labs  Lab 11/08/20 1553 11/11/20 1917 11/12/20 0416 11/13/20 0718 11/14/20 0738  NA 142 138 140 138 136  K 3.6 3.3* 3.2* 3.7 4.0  CL 104 102 105 105 104  CO2 '24 25 26 26 25  '$ GLUCOSE 120* 111* 106* 123* 127*  BUN '17 19 20 14 18  '$ CREATININE 1.46* 1.41* 1.30* 1.31* 1.35*  CALCIUM 8.9 8.8* 8.5* 8.3* 8.2*  MG  --   --   --  1.8  --    GFR: Estimated Creatinine Clearance: 35.4 mL/min (A) (by C-G formula based on SCr of 1.35 mg/dL (H)). Liver Function Tests: Recent Labs  Lab 11/11/20 1917  AST 28  ALT 15  ALKPHOS 84  BILITOT 0.7  PROT 7.3  ALBUMIN 4.1   No results for input(s): LIPASE, AMYLASE in the last 168  hours. No results for input(s): AMMONIA in the last 168 hours. Coagulation Profile: Recent Labs  Lab 11/12/20 0416  INR 1.2   Cardiac Enzymes: No results for input(s): CKTOTAL, CKMB, CKMBINDEX, TROPONINI in the last 168 hours. BNP (last 3 results) No results for input(s): PROBNP in the last 8760 hours. HbA1C: No results for input(s): HGBA1C in the last 72 hours. CBG: No results for input(s): GLUCAP in the last 168 hours. Lipid Profile: No results for input(s): CHOL, HDL, LDLCALC, TRIG, CHOLHDL, LDLDIRECT in the last 72 hours. Thyroid Function Tests: No results for input(s): TSH, T4TOTAL, FREET4, T3FREE, THYROIDAB in the last 72 hours. Anemia Panel: No results for input(s): VITAMINB12, FOLATE, FERRITIN, TIBC, IRON, RETICCTPCT in the last 72 hours.    Radiology Studies: I have reviewed all of  the imaging during this hospital visit personally     Scheduled Meds:  aspirin EC  81 mg Oral BID   carbidopa-levodopa  1.5 tablet Oral TID   docusate sodium  100 mg Oral BID   enoxaparin (LOVENOX) injection  30 mg Subcutaneous Q24H   fluticasone  2 spray Each Nare QHS   pantoprazole  40 mg Oral BID   Continuous Infusions:  methocarbamol (ROBAXIN) IV       LOS: 3 days        Torianna Junio Gerome Apley, MD

## 2020-11-14 NOTE — Progress Notes (Signed)
Daily Progress Note   Patient Name: Jon Reid       Date: 11/14/2020 DOB: 1928-05-27  Age: 87 y.o. MRN#: [id number] Attending Physician: Jon Reid Primary Care Physician: Jon Fraise, MD Admit Date: 11/11/2020  Reason for Consultation/Follow-up: Establishing goals of care  Subjective: Medical records reviewed. Patient assessed at the bedside. He is sitting up in bed, denies pain unless he moves his right leg. Denies distress.  Created space and opportunity for thoughts and feelings on his current illness and options moving forward. Jon Reid remains hopeful for improvement and voices his preference to go home with outpatient PT visits at the office he previously was seen at. He understands that SNF is being mentioned as a possibility although he is unsure why. Reviewed the importance of upcoming PT/OT evaluations and following their recommendations for a safe discharge plan. Counseled that he may need more assistance than outpatient or HH PT can provide, due to his acute postop condition. He confirms that he would be willing to go to SNF if recommended for a short-term rehab stay. He values his independence and prefers to reach out to providers for assistance/support on an as needed basis.  Outpatient palliative care services were explained and offered. Emphasized the importance of ongoing goals of care discussions with family. Discussed patient's thoughts on code status as an example of advance care planning. Reviewed the high potential for more harm than benefit given patient's comorbidities and recommended a DNR. Patient will continue discussing with his family and does not feel he needs a palliative care referral at this time.  I called patient's son Jon Reid and left a voicemail  requesting a return call.  Patient's son Jon Reid and wife then arrived at the bedside. I introduced palliative care briefly and offered support throughout patient's hospitalization. Patient's wife is concerned when she hears that he would like to go home at discharge. She feels this would be difficult for family to support and he would need a hospital bed for safety. Reviewed plan to follow PT/OT recommendations.  Questions and concerns addressed. PMT will continue to support holistically.  Length of Stay: 3  Current Medications: Scheduled Meds:   aspirin EC  81 mg Oral BID   carbidopa-levodopa  1.5 tablet Oral TID   docusate sodium  100 mg Oral BID   enoxaparin (  LOVENOX) injection  30 mg Subcutaneous Q24H   fluticasone  2 spray Each Nare QHS   pantoprazole  40 mg Oral BID    Continuous Infusions:  methocarbamol (ROBAXIN) IV      PRN Meds: acetaminophen, alum & mag hydroxide-simeth, bisacodyl, HYDROcodone-acetaminophen, HYDROcodone-acetaminophen, menthol-cetylpyridinium **OR** phenol, methocarbamol **OR** methocarbamol (ROBAXIN) IV, metoCLOPramide **OR** metoCLOPramide (REGLAN) injection, morphine injection, ondansetron **OR** ondansetron (ZOFRAN) IV, polyethylene glycol  Physical Exam Vitals and nursing note reviewed.  Constitutional:      General: He is not in acute distress. Cardiovascular:     Rate and Rhythm: Normal rate.  Pulmonary:     Effort: Pulmonary effort is normal.  Neurological:     Mental Status: He is alert. Mental status is at baseline.            Vital Signs: BP (!) 128/55 (BP Location: Right Arm)   Pulse 63   Temp 98.5 F (36.9 C) (Oral)   Resp 18   Ht 6' (1.829 m)   Wt 68.8 kg   SpO2 100%   BMI 20.57 kg/m  SpO2: SpO2: 100 % O2 Device: O2 Device: Nasal Cannula O2 Flow Rate: O2 Flow Rate (L/min): 2 L/min  Intake/output summary:  Intake/Output Summary (Last 24 hours) at 11/14/2020 1223 Last data filed at 11/14/2020 0900 Gross per 24 hour  Intake 240  ml  Output 500 ml  Net -260 ml   LBM: Last BM Date: 11/11/20 Baseline Weight: Weight: 68.8 kg Most recent weight: Weight: 68.8 kg  Patient Active Problem List   Diagnosis Date Noted   Closed right hip fracture, initial encounter ([place])    Femur fracture, right ([place]) 11/11/2020   Frail elderly 11/08/2020   Dizziness 09/23/2020   History of GI bleed 08/19/2020   Loss of weight 08/19/2020   Dysphagia 08/19/2020   Upper GI bleed 06/25/2020   COVID-19 virus infection 06/25/2020   [name] affect 02/09/2020   Iron deficiency anemia due to chronic blood loss 05/27/2019   Nonrheumatic mitral valve regurgitation 05/26/2019   Left anterior fascicular block 05/26/2019   Goals of care, counseling/discussion 04/06/2019   Constipation 02/19/2019   Elevated troponin 02/09/2019   Fecal occult blood test positive 02/09/2019   Spinal stenosis, lumbar region with neurogenic claudication 02/05/2019   Leg swelling 01/26/2019   Nonrheumatic aortic valve stenosis 01/26/2019   Multiple joint pain 12/11/2018   Chronic pain of both knees 11/07/2018   Vitamin D deficiency 10/23/2018   Gastroesophageal reflux disease with esophagitis 03/13/2018   Murmur, cardiac 12/04/2017   Coronary artery disease involving native coronary artery of native heart without angina pectoris 12/04/2017   PVC's (premature ventricular contractions) 12/04/2017   Heart disease 08/21/2017   Weakness 11/14/2016   Abnormality of gait 12/06/2015   Abdominal aortic atherosclerosis ([place]) 12/01/2015   Degeneration of lumbar intervertebral disc 12/01/2015   Exertional angina ([place]) 07/20/2015   Hernia of abdominal wall 10/12/2014   Gynecomastia 04/09/2013   Hx of CABG 02/02/2013   Myelodysplastic syndrome 11/06/2012   Vitamin B12 deficiency 07/07/2012   Hyperlipemia 07/07/2012   Peripheral vascular disease ([place]) 04/14/2009   CARCINOMA, PROSTATE 04/13/2009   Non-thrombocytopenic purpura ([place]) 04/13/2009   Benign essential  hypertension 04/13/2009   Chronic renal insufficiency, stage III (moderate) ([place]) 04/13/2009   SLEEP APNEA 04/13/2009    Palliative Care Assessment & Plan   Patient Profile: 87 y.o. male  with past medical history of stage III chronic kidney disease, coronary artery disease with history of first-degree block, GERD, hypertension, history  of prostate cancer admitted on 11/11/2020 with fracture of the right hip s/p accidental fall.    Patient has progressively weakness and frailty as well as a cardiac history that puts him at increased risk for surgery. Palliative care has been consulted to assist with goals of care conversation.  Assessment: Right hip fracture s/p hemiarthroplasty  Goals of care conversation  Recommendations/Plan: Goals remain clear - continue full code/full scope treatment Patient would prefer to go [facility]OT but is willing to go to SNF for short-term rehab if recommended. Declines OP PC referral at this time. PMT will follow peripherally, please reach with any acute changes or needs    Prognosis:  Unable to determine  Discharge Planning: To Be Determined  Care plan was discussed with patient, patient's wife and son Jon Reid  Total time: 35 minutes Greater than 50% of this time was spent in counseling and coordinating care related to the above assessment and plan.  Dorthy Cooler, PA-C Palliative Medicine Team Team phone # [phone]  Thank you for allowing the Palliative Medicine Team to assist in the care of this patient. Please utilize secure chat with additional questions, if there is no response within 30 minutes please call the above phone number.  Palliative Medicine Team providers are available by phone from 7am to 7pm daily and can be reached through the team cell phone.  Should this patient require assistance outside of these hours, please call the patient's attending physician.

## 2020-11-15 ENCOUNTER — Inpatient Hospital Stay (HOSPITAL_COMMUNITY): Payer: MEDICARE

## 2020-11-15 DIAGNOSIS — E440 Moderate protein-calorie malnutrition: Secondary | ICD-10-CM | POA: Insufficient documentation

## 2020-11-15 LAB — BASIC METABOLIC PANEL
Anion gap: 8 (ref 5–15)
BUN: 17 mg/dL (ref 8–23)
CO2: 25 mmol/L (ref 22–32)
Calcium: 8.5 mg/dL — ABNORMAL LOW (ref 8.9–10.3)
Chloride: 103 mmol/L (ref 98–111)
Creatinine, Ser: 1.21 mg/dL (ref 0.61–1.24)
GFR, Estimated: 57 mL/min — ABNORMAL LOW (ref >60)
Glucose, Bld: 113 mg/dL — ABNORMAL HIGH (ref 70–99)
Potassium: 3.6 mmol/L (ref 3.5–5.1)
Sodium: 136 mmol/L (ref 135–145)

## 2020-11-15 LAB — CBC
HCT: 27.6 % — ABNORMAL LOW (ref 39.0–52.0)
Hemoglobin: 9 g/dL — ABNORMAL LOW (ref 13.0–17.0)
MCH: 31.7 pg (ref 26.0–34.0)
MCHC: 32.6 g/dL (ref 30.0–36.0)
MCV: 97.2 fL (ref 80.0–100.0)
Platelets: 261 10*3/uL (ref 150–400)
RBC: 2.84 MIL/uL — ABNORMAL LOW (ref 4.22–5.81)
RDW: 16.4 % — ABNORMAL HIGH (ref 11.5–15.5)
WBC: 11.1 10*3/uL — ABNORMAL HIGH (ref 4.0–10.5)
nRBC: 0 % (ref 0.0–0.2)

## 2020-11-15 LAB — PROCALCITONIN: Procalcitonin: 0.1 ng/mL

## 2020-11-15 MED ORDER — ENOXAPARIN SODIUM 40 MG/0.4ML IJ SOSY
40.0000 mg | PREFILLED_SYRINGE | INTRAMUSCULAR | Status: DC
  Administered 2020-11-16 – 2020-11-17 (×2): 40 mg via SUBCUTANEOUS
  Filled 2020-11-15 (×2): qty 0.4

## 2020-11-15 MED ORDER — DM-GUAIFENESIN ER 30-600 MG PO TB12
1.0000 | ORAL_TABLET | Freq: Two times a day (BID) | ORAL | Status: DC
  Administered 2020-11-15 – 2020-11-17 (×5): 1 via ORAL
  Filled 2020-11-15 (×5): qty 1

## 2020-11-15 NOTE — Progress Notes (Signed)
RT called to room to assess patient because he was complaining of having SOB after taking medication and possibly aspirated.  Upon arrival, no distress noted, patient's O2 saturation was 100 % on 3 L [place], and  pulse of 80 bpm.   Patient breath sounds auscultated, coarse & rhonci.  Patient stated that he was laying back to much when he took his medicine.   RT explained that he should always be sitting up when eating, drinking  and taking medication. Patient complains that now he is having a burning sensation in the back of throat and mouth when he coughs.    RT advised RN that the burning sensation could be from patient's GERD.  RN advised to call MD or rapid if the issue progresses as patient's respiratory status is stable currently.

## 2020-11-15 NOTE — Progress Notes (Signed)
Thing on PROGRESS NOTE    Jon Reid  [id number] DOB: April 22, 1928 DOA: 11/11/2020 PCP: Claretta Fraise, MD   Brief Narrative:  This 87 years old male with PMH significant for CKD stage IIIb, coronary artery disease, first-degree AV block, GERD, hypertension, prostate cancer presented in the ED s/p mechanical fall with excruciating right hip pain.  His right leg is shortened and externally rotated. Covid 19+ on 11/11/20.  CT head negative for acute changes.  Right hip x-ray shows subcapital right hip fracture.  Orthopedic consulted,  Patient underwent right hip hemiarthroplasty with good toleration.  PT and OT recommended  skilled nursing versus CIR.  Awaiting disposition  Assessment & Plan:   Active Problems:   Benign essential hypertension   Peripheral vascular disease (HCC)   Chronic renal insufficiency, stage III (moderate) (HCC)   Coronary artery disease involving native coronary artery of native heart without angina pectoris   Gastroesophageal reflux disease with esophagitis   Goals of care, counseling/discussion   Femur fracture, right (HCC)   Closed right hip fracture, initial encounter (HCC)   S/P hip hemiarthroplasty   Malnutrition of moderate degree   Right hip fracture < s/p mechanical fall: Patient presented s/p mechanical fall with right hip pain. X-ray right hip shows subcapital right hip fracture. Ortho consulted, underwent right hip hemiarthroplasty 8/28. Postoperative day 2.  Continue adequate pain control. Continue DVT prophylaxis with Lovenox. PT and OT recommended SNF versus CIR.  COVID-19+: No signs of active infection. Patient tested positive for COVID-19 on 4/22. Discontinue isolation precautions as per ID.  CKD stage IIIb: Serum creatinine at baseline.   Baseline creatinine 1.35-1.4 Avoid nephrotoxic medications.  Anemia of chronic disease: Hemoglobin remained stable.  Hb 9.0. No obvious signs of bleeding.  Hypertension: BP controlled without  medication.  History of CAD CVA: Continue aspirin  Parkinson's disease: Continue Sinemet 3 times daily  Reactive leukocytosis: There is no signs of bacterial infection, We will continue to hold antibiotics. WBC improving.  Cough: Patient reports coughing since last night,  states he might have aspirated his medications. Obtain chest x-ray, procalcitonin,  Mucinex twice daily.   DVT prophylaxis: Lovenox Code Status: Full code Family Communication: No family at bedside Disposition Plan:   Status is: Inpatient  Remains inpatient appropriate because:Inpatient level of care appropriate due to severity of illness  Dispo: The patient is from: Home              Anticipated d/c is to: SNF              Patient currently is not medically stable to d/c.   Difficult to place patient No  Consultants:  Orthopedics  Procedures: Right hip hemiarthroplasty Antimicrobials:  Anti-infectives (From admission, onward)    Start     Dose/Rate Route Frequency Ordered Stop   11/13/20 1145  ceFAZolin (ANCEF) IVPB 2g/100 mL premix        2 g 200 mL/hr over 30 Minutes Intravenous Every 6 hours 11/13/20 1131 11/13/20 2037   11/13/20 0701  ceFAZolin (ANCEF) 2-4 GM/100ML-% IVPB       Note to Pharmacy: Valda Lamb   : cabinet override      11/13/20 0701 11/13/20 1914        Subjective: Patient was seen and examined at bedside.  Overnight events noted.   He was sitting comfortably on the bed on 2 L of supplemental oxygen.   He reports pain is better controlled,  Patient reports having coughing thinks he might have  aspirated his medications.  Objective: Vitals:   11/14/20 0900 11/14/20 1500 11/14/20 2355 11/15/20 0700  BP: (!) 128/55 (!) 121/56  129/75  Pulse: 63 67 80 71  Resp: '18 18 20 17  '$ Temp: 98.5 F (36.9 C) 98.4 F (36.9 C)  98.9 F (37.2 C)  TempSrc: Oral Oral  Oral  SpO2: 100% 98% 100% 96%  Weight:      Height:        Intake/Output Summary (Last 24 hours) at 11/15/2020  1232 Last data filed at 11/15/2020 0900 Gross per 24 hour  Intake 600 ml  Output 700 ml  Net -100 ml   Filed Weights   11/12/20 0500  Weight: 68.8 kg    Examination:  General exam: Appears comfortable, not in any acute distress. Respiratory system: Reduced breath sounds, clear to auscultation bilaterally. Cardiovascular system: S1-S2 heard, regular rate and rhythm, no murmur. Gastrointestinal system: Abdomen is soft, nontender, nondistended, bowel sounds+. Central nervous system: Alert and oriented x3, no focal neurological deficits. Extremities: Right hip, no tenderness, able to move his leg. Skin: No rashes. Psychiatry: Mood and affect appropriate.    Data Reviewed: I have personally reviewed following labs and imaging studies  CBC: Recent Labs  Lab 11/08/20 1553 11/11/20 1917 11/12/20 0416 11/13/20 0718 11/14/20 0738 11/15/20 0227  WBC 7.4 12.4* 9.2 11.6* 15.4* 11.1*  NEUTROABS 4.0 10.5*  --   --   --   --   HGB 9.7* 11.1* 8.9* 9.3* 9.0* 9.0*  HCT 29.7* 34.7* 28.0* 29.4* 28.1* 27.6*  MCV 94 99.7 100.0 98.7 97.9 97.2  PLT 408 389 328 298 276 0000000   Basic Metabolic Panel: Recent Labs  Lab 11/11/20 1917 11/12/20 0416 11/13/20 0718 11/14/20 0738 11/15/20 0227  NA 138 140 138 136 136  K 3.3* 3.2* 3.7 4.0 3.6  CL 102 105 105 104 103  CO2 '25 26 26 25 25  '$ GLUCOSE 111* 106* 123* 127* 113*  BUN '19 20 14 18 17  '$ CREATININE 1.41* 1.30* 1.31* 1.35* 1.21  CALCIUM 8.8* 8.5* 8.3* 8.2* 8.5*  MG  --   --  1.8  --   --    GFR: Estimated Creatinine Clearance: 39.5 mL/min (by C-G formula based on SCr of 1.21 mg/dL). Liver Function Tests: Recent Labs  Lab 11/11/20 1917  AST 28  ALT 15  ALKPHOS 84  BILITOT 0.7  PROT 7.3  ALBUMIN 4.1   No results for input(s): LIPASE, AMYLASE in the last 168 hours. No results for input(s): AMMONIA in the last 168 hours. Coagulation Profile: Recent Labs  Lab 11/12/20 0416  INR 1.2   Cardiac Enzymes: No results for input(s):  CKTOTAL, CKMB, CKMBINDEX, TROPONINI in the last 168 hours. BNP (last 3 results) No results for input(s): PROBNP in the last 8760 hours. HbA1C: No results for input(s): HGBA1C in the last 72 hours. CBG: No results for input(s): GLUCAP in the last 168 hours. Lipid Profile: No results for input(s): CHOL, HDL, LDLCALC, TRIG, CHOLHDL, LDLDIRECT in the last 72 hours. Thyroid Function Tests: No results for input(s): TSH, T4TOTAL, FREET4, T3FREE, THYROIDAB in the last 72 hours. Anemia Panel: No results for input(s): VITAMINB12, FOLATE, FERRITIN, TIBC, IRON, RETICCTPCT in the last 72 hours. Sepsis Labs: No results for input(s): PROCALCITON, LATICACIDVEN in the last 168 hours.  Recent Results (from the past 240 hour(s))  Resp Panel by RT-PCR (Flu A&B, Covid)     Status: Abnormal   Collection Time: 11/11/20  8:14 PM   Specimen: Nasopharyngeal(NP)  swabs in vial transport medium  Result Value Ref Range Status   SARS Coronavirus 2 by RT PCR POSITIVE (A) NEGATIVE Final    Comment: CRITICAL RESULT CALLED TO, READ BACK BY AND VERIFIED WITH: TURNER,C 2154 11/11/2020 COLEMAN,R (NOTE) SARS-CoV-2 target nucleic acids are DETECTED.  The SARS-CoV-2 RNA is generally detectable in upper respiratory specimens during the acute phase of infection. Positive results are indicative of the presence of the identified virus, but do not rule out bacterial infection or co-infection with other pathogens not detected by the test. Clinical correlation with patient history and other diagnostic information is necessary to determine patient infection status. The expected result is Negative.  Fact Sheet for Patients: EntrepreneurPulse.com.au  Fact Sheet for Healthcare Providers: IncredibleEmployment.be  This test is not yet approved or cleared by the [place] FDA and  has been authorized for detection and/or diagnosis of SARS-CoV-2 by FDA under an Emergency Use Authorization  (EUA).  This EUA will remain in effect (meaning this t est can be used) for the duration of  the COVID-19 declaration under Section 564(b)(1) of the Act, 21 U.S.C. section 360bbb-3(b)(1), unless the authorization is terminated or revoked sooner.     Influenza A by PCR NEGATIVE NEGATIVE Final   Influenza B by PCR NEGATIVE NEGATIVE Final    Comment: (NOTE) The Xpert Xpress SARS-CoV-2/FLU/RSV plus assay is intended as an aid in the diagnosis of influenza from Nasopharyngeal swab specimens and should not be used as a sole basis for treatment. Nasal washings and aspirates are unacceptable for Xpert Xpress SARS-CoV-2/FLU/RSV testing.  Fact Sheet for Patients: EntrepreneurPulse.com.au  Fact Sheet for Healthcare Providers: IncredibleEmployment.be  This test is not yet approved or cleared by the [place] FDA and has been authorized for detection and/or diagnosis of SARS-CoV-2 by FDA under an Emergency Use Authorization (EUA). This EUA will remain in effect (meaning this test can be used) for the duration of the COVID-19 declaration under Section 564(b)(1) of the Act, 21 U.S.C. section 360bbb-3(b)(1), unless the authorization is terminated or revoked.  Performed at [facility], [address] [place], [place] [address]   Surgical pcr screen     Status: None   Collection Time: 11/11/20 11:44 PM   Specimen: Nasal Mucosa; Nasal Swab  Result Value Ref Range Status   MRSA, PCR NEGATIVE NEGATIVE Final   Staphylococcus aureus NEGATIVE NEGATIVE Final    Comment: (NOTE) The Xpert SA Assay (FDA approved for NASAL specimens in patients 26 years of age and older), is one component of a comprehensive surveillance program. It is not intended to diagnose infection nor to guide or monitor treatment. Performed at [facility], [address] [place], [place] [address]     Radiology Studies: No results found.  Scheduled Meds:  aspirin EC  81 mg Oral BID    carbidopa-levodopa  1.5 tablet Oral TID   dextromethorphan-guaiFENesin  1 tablet Oral BID   docusate sodium  100 mg Oral BID   enoxaparin (LOVENOX) injection  30 mg Subcutaneous Q24H   feeding supplement  237 mL Oral TID BM   fluticasone  2 spray Each Nare QHS   multivitamin with minerals  1 tablet Oral Daily   pantoprazole  40 mg Oral BID   Continuous Infusions:  methocarbamol (ROBAXIN) IV       LOS: 4 days    Time spent: 35 mins    Elisabet Gutzmer, MD [facility]   If 7PM-7AM, please contact night-coverage

## 2020-11-15 NOTE — TOC CAGE-AID Note (Signed)
Transition of Care [facility]) - CAGE-AID Screening   Patient Details  Name: Jon Reid MRN: [id number] Date of Birth: 18-Feb-1929  Transition of Care [facility]) CM/SW Contact:    Keitra Carusone C Tarpley-Carter, [place] Phone Number: 11/15/2020, 12:44 PM   Clinical Narrative: Pt is unable to participate in Cage Aid.  Pt is in appropriate for assessment.  Asiah Befort Tarpley-Carter, MSW, LCSW-A Pronouns:  She/Her/Hers [facility] Health[facility]itions of Care Clinical Social Worker Direct Number:  [phone] [email or web address]  CAGE-AID Screening: Substance Abuse Screening unable to be completed due to: : Patient unable to participate

## 2020-11-15 NOTE — Progress Notes (Signed)
Subjective: Patient reports pain as mild. Tolerating diet. Foley still in place. Making urine. No CP, but reports feeling chest congestion. Thinks he choked on his medicines last night and might have aspirated something. Some confusion/inability to collect his thoughts. Mumbling so difficult to understand him. Working with PT/OT on mobilizing OOB but not doing very well thus far.   Objective:   VITALS:   Vitals:   11/14/20 0900 11/14/20 1500 11/14/20 2355 11/15/20 0700  BP: (!) 128/55 (!) 121/56  129/75  Pulse: 63 67 80 71  Resp: '18 18 20 17  '$ Temp: 98.5 F (36.9 C) 98.4 F (36.9 C)  98.9 F (37.2 C)  TempSrc: Oral Oral  Oral  SpO2: 100% 98% 100% 96%  Weight:      Height:       CBC Latest Ref Rng & Units 11/15/2020 11/14/2020 11/13/2020  WBC 4.0 - 10.5 K/uL 11.1(H) 15.4(H) 11.6(H)  Hemoglobin 13.0 - 17.0 g/dL 9.0(L) 9.0(L) 9.3(L)  Hematocrit 39.0 - 52.0 % 27.6(L) 28.1(L) 29.4(L)  Platelets 150 - 400 K/uL 261 276 298   BMP Latest Ref Rng & Units 11/15/2020 11/14/2020 11/13/2020  Glucose 70 - 99 mg/dL 113(H) 127(H) 123(H)  BUN 8 - 23 mg/dL '17 18 14  '$ Creatinine 0.61 - 1.24 mg/dL 1.21 1.35(H) 1.31(H)  BUN/Creat Ratio 10 - 24 - - -  Sodium 135 - 145 mmol/L 136 136 138  Potassium 3.5 - 5.1 mmol/L 3.6 4.0 3.7  Chloride 98 - 111 mmol/L 103 104 105  CO2 22 - 32 mmol/L '25 25 26  '$ Calcium 8.9 - 10.3 mg/dL 8.5(L) 8.2(L) 8.3(L)   Intake/Output      08/29 0701 08/30 0700 08/30 0701 08/31 0700   P.O. 600 240   I.V. (mL/kg)     IV Piggyback     Total Intake(mL/kg) 600 (8.7) 240 (3.5)   Urine (mL/kg/hr) 900 (0.5)    Blood     Total Output 900    Net -300 +240           Physical Exam: General: NAD. Napping but easy to wake. Calm Resp: Wet cough. Sounds very congested.  Cardio: regular rate and rhythm ABD soft Neurologically intact MSK Neurovascularly intact Sensation intact distally Intact pulses distally Dorsiflexion/Plantar flexion intact TTP R calf. Calf soft and  compressible TTP R hip Incision: dressing C/D/I KI RLE   Assessment: 2 Days Post-Op  S/P Procedure(s) (LRB): ARTHROPLASTY BIPOLAR HIP (HEMIARTHROPLASTY) (Right) by Dr. Ernesta Amble. Percell Miller on 11/13/20  Active Problems:   Benign essential hypertension   Peripheral vascular disease (HCC)   Chronic renal insufficiency, stage III (moderate) (HCC)   Coronary artery disease involving native coronary artery of native heart without angina pectoris   Gastroesophageal reflux disease with esophagitis   Goals of care, counseling/discussion   Femur fracture, right (HCC)   Closed right hip fracture, initial encounter ([place])   S/P hip hemiarthroplasty   Malnutrition of moderate degree   Plan: Posterior hip precautions Advance diet Up with therapy Incentive Spirometry. May need a CXR to r/o aspiration pneumonia. Likely needs a decongestant ordered by primary team Elevate leg and Apply ice  Low Vit D level. Primary team may want to consider Vit D supplementation to aid healing   Weightbearing: WBAT RLE Insicional and dressing care: Dressings left intact until follow-up and Reinforce dressings as needed Orthopedic device(s):  KI to be worn when in bed or chair, can remove for PT/OT sessions Showering: POD 3, Keep dressing dry VTE prophylaxis:  Lovenox '40mg'$  qd  while inpatient , can switch to ASA '81mg'$  bid x 30 days upon d/c, SCDs, ambulation Pain control: Continue current regimen. Not having much pain Follow - up plan: 2 weeks post-op in the office Contact information:  Edmonia Lynch MD, Aggie Moats PA-C  Dispo:  TBD based on PT/OT evals . Patient lives with his wife in their own home. Previous notes mention his wife is worried about him coming home right away and her ability to care for him. Due to cognition deficits and poor mobilization, he will likely need to go to a SNF or CIR to recover. Looks like CIR eval in place currently.    Britt Bottom, PA-C Office [phone] 11/15/2020,  11:58 AM

## 2020-11-15 NOTE — Progress Notes (Signed)
Inpatient Rehab Admissions Coordinator Note:   Per PT recommendations patient was screened for CIR candidacy by Michel Santee, PT, DPT. At this time, pt appears to be a potential candidate for CIR. I will place an order for rehab consult for full assessment, per our protocol.  Please contact me any with questions.Shann Medal, PT, DPT [phone] 11/15/20 9:30 AM

## 2020-11-16 LAB — CBC
HCT: 25.3 % — ABNORMAL LOW (ref 39.0–52.0)
Hemoglobin: 8.1 g/dL — ABNORMAL LOW (ref 13.0–17.0)
MCH: 31.5 pg (ref 26.0–34.0)
MCHC: 32 g/dL (ref 30.0–36.0)
MCV: 98.4 fL (ref 80.0–100.0)
Platelets: 222 10*3/uL (ref 150–400)
RBC: 2.57 MIL/uL — ABNORMAL LOW (ref 4.22–5.81)
RDW: 16.1 % — ABNORMAL HIGH (ref 11.5–15.5)
WBC: 10.1 10*3/uL (ref 4.0–10.5)
nRBC: 0 % (ref 0.0–0.2)

## 2020-11-16 NOTE — Progress Notes (Signed)
Inpatient Rehab Admissions Coordinator:    I met with pt. To discuss potential CIR admit. He states interest and indicates that his family can provide 24/7 assist. I will reach out to family to confirm and pursue for admission.   Clemens Catholic, [place], [place] Admissions Coordinator  [phone] ([place]) [phone] (office)

## 2020-11-16 NOTE — PMR Pre-admission (Signed)
PMR Admission Coordinator Pre-Admission Assessment  Patient: Jon Reid is an 87 y.o., male MRN: [id number] DOB: [birth year] Height: 6' (182.9 cm) Weight: 68.8 kg  Insurance Information HMO:     PPO:      PCP:      IPA:      80/20: yes     OTHER:  PRIMARY: [employer]  for Part A and Medicare Part B   Policy#: [id number]      Subscriber: Pt Medicare       Policy#:       Subscriber: Pt. Phone#: Verified online    Fax#:  Pre-Cert#:       Employer:  Benefits:  Phone #:      Name:  Eff. Date: Parts A ad B effective 07/18/1995  Deduct: $9373      Out of Pocket Max:  None      Life Max: N/A  CIR: 100%      SNF: 100 days Outpatient: 80%     Co-Pay: 20% Home Health: 100%      Co-Pay: none DME: 80%     Co-Pay: 20% Providers: patient's choice  SECONDARY: UHC Indemnity      Policy#: [id number]     Phone#:   Financial Counselor:       Phone#:   The "Data Collection Information Summary" for patients in Inpatient Rehabilitation Facilities with attached "Privacy Act [place] Records" was provided and verbally reviewed with: Patient  Emergency Contact Information Contact Information     Name Relation Home Work Mobile   [place] Son [phone]  [phone]   Reid, Jon [phone]  [phone]   Petsch,Betty Spouse [phone]  [phone]       Current Medical History  Patient Admitting Diagnosis: R femur fracture  History of Present Illness: Jon Reid is a 87 y.o. male with a history of stage III chronic kidney disease, coronary artery disease with history of first-degree block, GERD, hypertension, history of prostate cancer.  Patient presented to the ED 11/11/20 after a fall.   Patient landed on his right hip and had immediate pain.  Pain increased with movement and improved with rest.    He was brought to the emergency department for evaluation.  Imaging revealed subcapital right hip fracture with varus angulation and foreshortening. Pt. Underwent  R hemiarthroplasty on 11/13/20 with Orthopedic Surgery. Pt. Tested positive for COVID 19 on PCR on 11/11/20; however, Pt. Was seen by Infectious Disease who noted that Pt. Has a history of positive COVID tests since infection in April 2022 and felt that it was a false positive related to his old infection. Pt. Has had no respiratory symptoms during this admission. CIR was consulted to assist return to PLOF.   Patient's [facility] has been reviewed by the rehabilitation admission coordinator and physician.  Past Medical History  Past Medical History:  Diagnosis Date   Arthritis    Carcinoma of prostate ([place])    prostate   CKD (chronic kidney disease), stage III ([place])    Coronary artery disease    a. 10/2012 Cath: LM 20-30d, lAD 80p, 8m 80-90d, LCX 90-992mOM1 90, RCA 50p, 2053m0d, RPDA 90, EF nl; b. 10/2012 CABG x 4 (LIMA-LAD, SVG-OM1, SVG-PDA->RPL); c. 07/2015 MV: Basal and proximal septal infarct w/o ischemia. EF 57%-->low risk.   First degree AV block 01/28/2019   Noted on EKG   GERD (gastroesophageal reflux disease)    Gout    HTN (hypertension)  Macrocytosis without anemia 01/08/2012   Moderate aortic stenosis    a. 10/2012 Echo: EF 55-60%, no rwma, BAE, mild AI, mild TR; b. 06/2020 Echo: EF 50-55%, no rwma, mild LVH. Mod dil LA. Mild MR. Mod AS.   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 Yes  Family History   family history includes Aneurysm in his father; Cancer in his mother; Hypertension in his father; Parkinson's disease in his son; Stroke in his father.  Current Medications  Current Facility-Administered Medications:    acetaminophen (TYLENOL) tablet 325-650 mg, 325-650 mg, Oral, Q6H PRN, Gawne, Meghan M, PA-C   alum & mag  hydroxide-simeth (MAALOX/MYLANTA) 200-200-20 MG/5ML suspension 30 mL, 30 mL, Oral, Q4H PRN, Gawne, Meghan M, PA-C   aspirin EC tablet 81 mg, 81 mg, Oral, BID, Aggie Moats M, PA-C, 81 mg at 11/17/20 5993   bisacodyl (DULCOLAX) suppository 10 mg, 10 mg, Rectal, Daily PRN, Gawne, Meghan M, PA-C   carbidopa-levodopa (SINEMET IR) 25-100 MG per tablet immediate release 1.5 tablet, 1.5 tablet, Oral, TID, Gawne, Meghan M, PA-C, 1.5 tablet at 11/17/20 0904   dextromethorphan-guaiFENesin ([place] DM) 30-600 MG per 12 hr tablet 1 tablet, 1 tablet, Oral, BID, Shawna Clamp, MD, 1 tablet at 11/17/20 0904   docusate sodium (COLACE) capsule 100 mg, 100 mg, Oral, BID, Gawne, Meghan M, PA-C, 100 mg at 11/17/20 0905   enoxaparin (LOVENOX) injection 40 mg, 40 mg, Subcutaneous, Q24H, Gawne, Meghan M, PA-C, 40 mg at 11/17/20 0900   feeding supplement (ENSURE ENLIVE / ENSURE PLUS) liquid 237 mL, 237 mL, Oral, TID BM, Arrien, Jimmy Picket, MD, 237 mL at 11/17/20 1100   fluticasone (FLONASE) 50 MCG/ACT nasal spray 2 spray, 2 spray, Each Nare, QHS, Gawne, Meghan M, PA-C, 2 spray at 11/16/20 2147   HYDROcodone-acetaminophen (NORCO) 7.5-325 MG per tablet 1-2 tablet, 1-2 tablet, Oral, Q4H PRN, Gawne, Meghan M, PA-C   HYDROcodone-acetaminophen (NORCO/VICODIN) 5-325 MG per tablet 1-2 tablet, 1-2 tablet, Oral, Q4H PRN, Gawne, Meghan M, PA-C, 1 tablet at 11/15/20 2137   menthol-cetylpyridinium (CEPACOL) lozenge 3 mg, 1 lozenge, Oral, PRN **OR** phenol (CHLORASEPTIC) mouth spray 1 spray, 1 spray, Mouth/Throat, PRN, Gawne, Meghan M, PA-C   methocarbamol (ROBAXIN) tablet 500 mg, 500 mg, Oral, Q6H PRN **OR** methocarbamol (ROBAXIN) 500 mg in dextrose 5 % 50 mL IVPB, 500 mg, Intravenous, Q6H PRN, Gawne, Meghan M, PA-C   metoCLOPramide (REGLAN) tablet 5-10 mg, 5-10 mg, Oral, Q8H PRN **OR** metoCLOPramide (REGLAN) injection 5-10 mg, 5-10 mg, Intravenous, Q8H PRN, Gawne, Meghan M, PA-C   morphine 2 MG/ML injection 0.5-1 mg, 0.5-1 mg,  Intravenous, Q2H PRN, Gawne, Meghan M, PA-C   multivitamin with minerals tablet 1 tablet, 1 tablet, Oral, Daily, Arrien, Jimmy Picket, MD, 1 tablet at 11/17/20 0904   ondansetron (ZOFRAN) tablet 4 mg, 4 mg, Oral, Q6H PRN **OR** ondansetron (ZOFRAN) injection 4 mg, 4 mg, Intravenous, Q6H PRN, Gawne, Meghan M, PA-C   pantoprazole (PROTONIX) EC tablet 40 mg, 40 mg, Oral, BID, Gawne, Meghan M, PA-C, 40 mg at 11/17/20 0905   polyethylene glycol (MIRALAX / GLYCOLAX) packet 17 g, 17 g, Oral, Daily PRN, Aggie Moats M, PA-C, 17 g at 11/17/20 1232  Patients Current Diet:  Diet Order  Diet Heart Room service appropriate? Yes; Fluid consistency: Thin  Diet effective now                   Precautions / Restrictions Precautions Precautions: Posterior Hip, Fall Precaution Booklet Issued: No Precaution Comments: verbally reviewed Restrictions Weight Bearing Restrictions: Yes RLE Weight Bearing: Weight bearing as tolerated Other Position/Activity Restrictions: posterior hip precautions R LE   Has the patient had 2 or more falls or a fall with injury in the past year? Yes  Prior Activity Level Community (5-7x/wk): Pt. was active in the community PTAPt. Was active in the community PTA  Prior Functional Level Self Care: Did the patient need help bathing, dressing, using the toilet or eating? Independent  Indoor Mobility: Did the patient need assistance with walking from room to room (with or without device)? Independent  Stairs: Did the patient need assistance with internal or external stairs (with or without device)? Independent  Functional Cognition: Did the patient need help planning regular tasks such as shopping or remembering to take medications? Independent  Patient Information Are you of Hispanic, Latino/a,or Spanish origin?: A. No, not of Hispanic, Latino/a, or Spanish origin What is your race?: A. White Do you need or want an interpreter to communicate with a  doctor or health care staff?: 0. No  Patient's Response To:  Health Literacy and Transportation Is the patient able to respond to health literacy and transportation needs?: Yes Health Literacy - How often do you need to have someone help you when you read instructions, pamphlets, or other written material from your doctor or pharmacy?: Never In the past 12 months, has lack of transportation kept you from medical appointments or from getting medications?: No In the past 12 months, has lack of transportation kept you from meetings, work, or from getting things needed for daily living?: No  Home Assistive Devices / [place] Devices/Equipment: Radio producer (specify quad or straight) Home Equipment: Environmental consultant - 2 wheels, Cane - single point, Grab bars - tub/shower  Prior Device Use: Indicate devices/aids used by the patient prior to current illness, exacerbation or injury? Walker  Current Functional Level Cognition  Overall Cognitive Status: Impaired/Different from baseline Current Attention Level: Selective Orientation Level: Oriented X4 Following Commands: Follows one step commands with increased time Safety/Judgement: Decreased awareness of deficits General Comments: pt basically asked PT to move him to EOB as he could not marshall the effort and motor planning to do the task    Extremity Assessment (includes Sensation/Coordination)  Upper Extremity Assessment: Defer to OT evaluation  Lower Extremity Assessment: Generalized weakness    ADLs  Overall ADL's : Needs assistance/impaired Eating/Feeding: Independent, Sitting Grooming: Set up, Sitting Upper Body Bathing: Set up, Sitting Lower Body Bathing: Maximal assistance, +2 for safety/equipment, +2 for physical assistance, Sit to/from stand Upper Body Dressing : Set up, Sitting General ADL Comments: Pt currently limited by pain, generalized weakness and problem solving    Mobility  Overal bed mobility: Needs Assistance Bed  Mobility: Supine to Sit, Sit to Supine Supine to sit: Max assist Sit to supine: Max assist, +2 for physical assistance, +2 for safety/equipment General bed mobility comments: pt was assisted to side of bed in brace and elected to leave it on to support standing    Transfers  Overall transfer level: Needs assistance Equipment used: Rolling walker (2 wheeled) Transfers: Sit to/from Stand Sit to Stand: Max assist Stand pivot transfers: Max assist, +2 safety/equipment, +2 physical assistance General transfer comment: stood in the  brace to see if it offered support and pt did do a better job controlling the RLE.  Able to side step on the bed wth one person    Ambulation / Gait / Stairs / Wheelchair Mobility  Ambulation/Gait Ambulation/Gait assistance: Max assist, +2 safety/equipment Gait Distance (Feet): 5 Feet Assistive device: Rolling walker (2 wheeled) Gait Pattern/deviations: Step-to pattern, Decreased step length - right, Decreased step length - left, Shuffle, Trunk flexed, Narrow base of support, Leaning posteriorly General Gait Details: maxA to maintain balance due to heavy posterior lean, but able to initiate gait training today- gait pattern very parkinsonian with occasional freezes and slow processing/limited movement initiation but improved with simple step by step cues. Fatigues very easily and started dragging L LE due to difficult weight shifting onto RLE secondary to fatigue after gait training a couple feet. close chair follow by second person for safety. Gait velocity: decreased.    Posture / Balance Dynamic Sitting Balance Sitting balance - Comments: posterior lean Balance Overall balance assessment: Needs assistance Sitting-balance support: Feet supported Sitting balance-Leahy Scale: Poor Sitting balance - Comments: posterior lean Postural control: Posterior lean Standing balance support: Bilateral upper extremity supported, During functional activity Standing  balance-Leahy Scale: Poor Standing balance comment: BLE reliant on external support, posterior lean    Special needs/care consideration Oxygen 02 at 2L and Skin Right hip post op incision with dressing   Previous Home Environment (from acute therapy documentation) Living Arrangements: Spouse/significant other, Children (has 3 sons that are local)  Lives With: Significant other, Son Available Help at Discharge: Family, Available PRN/intermittently Type of Home: House Home Layout: Able to live on main level with bedroom/bathroom, Two level Alternate Level Stairs-Number of Steps: flight Home Access: Stairs to enter Entrance Stairs-Number of Steps: 1 step in the back Bathroom Shower/Tub: Chiropodist:  (unclear) Home Care Services: No Additional Comments: has lots of DME but does not use it ; has had 10-11 falls recently  Discharge Living Setting Plans for Discharge Living Setting: Patient's home Type of Home at Discharge: House Discharge Home Layout: Two level, Able to live on main level with bedroom/bathroom Alternate Level Stairs-Rails: Right Alternate Level Stairs-Number of Steps: full flight Discharge Home Access: Level entry Discharge Bathroom Shower/Tub: Tub/shower unit Discharge Bathroom Toilet: Standard Discharge Bathroom Accessibility: Yes How Accessible: Accessible via walker Does the patient have any problems obtaining your medications?: No  Social/Family/Support Systems Patient Roles: Spouse Contact Information: [phone] Anticipated Caregiver: Zandon Talton Anticipated Caregiver's Contact Information: [phone] Ability/Limitations of Caregiver: Wife can provide supervision.  Two sons Jeani Hawking and [name] are in and out all day and can assist.  They work 2 blocks away. Caregiver Availability: 24/7 Discharge Plan Discussed with Primary Caregiver: Yes Is Caregiver In Agreement with Plan?: Yes Does Caregiver/Family have Issues with  Lodging/Transportation while Pt is in Rehab?: No  Goals Patient/Family Goal for Rehab: PT/OT Min A Expected length of stay: 16-18 days Pt/Family Agrees to Admission and willing to participate: Yes Program Orientation Provided & Reviewed with Pt/Caregiver Including Roles  & Responsibilities: Yes  Decrease burden of Care through IP rehab admission: Specialzed equipment needs, Decrease number of caregivers, Bowel and bladder program, and Patient/family education  Possible need for SNF placement upon discharge: not anticipated  Patient Condition: I have reviewed medical records from [facility] , spoken with CM, and patient. I met with patient at the bedside for inpatient rehabilitation assessment.  Patient will benefit from ongoing PT and OT, can actively participate in 3  hours of therapy a day 5 days of the week, and can make measurable gains during the admission.  Patient will also benefit from the coordinated team approach during an Inpatient Acute Rehabilitation admission.  The patient will receive intensive therapy as well as Rehabilitation physician, nursing, social worker, and care management interventions.  Due to bladder management, bowel management, safety, skin/wound care, disease management, medication administration, and patient education the patient requires 24 hour a day rehabilitation nursing.  The patient is currently Max assist +2 +2 with mobility and basic ADLs.  Discharge setting and therapy post discharge at home with home health is anticipated.  Patient has agreed to participate in the Acute Inpatient Rehabilitation Program and will admit today.  Preadmission Screen Completed By:  Retta Diones, 11/17/2020 1:11 PM ______________________________________________________________________   Discussed status with Dr. Dagoberto Ligas on 11/17/20 at 1000 and received approval for admission today.  Admission Coordinator:  Retta Diones, RN, time 1311/Date 11/17/20    Assessment/Plan: Diagnosis: Does the need for close, 24 hr/day Medical supervision in concert with the patient's rehab needs make it unreasonable for this patient to be served in a less intensive setting? Yes Co-Morbidities requiring supervision/potential complications: R hip fx s/p hemiarthroplasty- CKD3B, CAD, GERD, prostate CA, WBAT; Hb dropping/anemia- post op Due to bladder management, bowel management, safety, skin/wound care, disease management, medication administration, pain management, and patient education, does the patient require 24 hr/day rehab nursing? Yes Does the patient require coordinated care of a physician, rehab nurse, PT, OT, and SLP to address physical and functional deficits in the context of the above medical diagnosis(es)? Yes Addressing deficits in the following areas: balance, endurance, locomotion, strength, transferring, bowel/bladder control, bathing, dressing, feeding, grooming, and toileting Can the patient actively participate in an intensive therapy program of at least 3 hrs of therapy 5 days a week? Yes The potential for patient to make measurable gains while on inpatient rehab is good and fair Anticipated functional outcomes upon discharge from inpatient rehab: min assist PT, min assist OT, n/a SLP Estimated rehab length of stay to reach the above functional goals is: 16-18 days Anticipated discharge destination: Home 10. Overall Rehab/Functional Prognosis: good and fair   MD Signature:

## 2020-11-16 NOTE — Progress Notes (Signed)
PROGRESS NOTE    Jon Reid  [id number] DOB: [birth year] DOA: 11/11/2020 PCP: Claretta Fraise, MD   Brief Narrative: Jon Reid is a 87 y.o. male with a history of CKD stage IIIb, CAD, first-degree AV block, GERD, hypertension, prostate cancer..  Patient presents secondary to fall suffering a right hip fracture.  Orthopedic surgery was consulted and performed right hemiarthroplasty on 8/28.  Plan for inpatient rehab for discharge.   Assessment & Plan:   Active Problems:   Benign essential hypertension   Peripheral vascular disease (HCC)   Chronic renal insufficiency, stage III (moderate) (HCC)   Coronary artery disease involving native coronary artery of native heart without angina pectoris   Gastroesophageal reflux disease with esophagitis   Goals of care, counseling/discussion   Femur fracture, right (HCC)   Closed right hip fracture, initial encounter ([place])   S/P hip hemiarthroplasty   Malnutrition of moderate degree   Right hip fracture Secondary to fall. X-ray significant for subcapital right hip fracture. Orthopedic surgery consulted and performed right hemiarthroplasty on 8/28. Recommendations for WBAT of RLE, keep dressing intact until outpatient follow-up, aspirin 81 mg BID x30 days on discharge for VTE prophylaxis  Sars-CoV-2 positive No concern for active infection.  CKD stage IIIb Stable.  Anemia of chronic disease Slightly worsened hemoglobin. No obvious evidence of hematoma or hemorrhage -Repeat CBC in AM  Primary hypertension Not on medications. Stable.  Parkinson disease Patient is on Sinemet as an outpatient -Continue Sinemet TID  History of CAD History of CVA -Continue aspirin  Reactive leukocytosis Secondary to fracture. Resolved.  Cough Chest x-ray without evidence pneumonia   DVT prophylaxis: Lovenox Code Status:   Code Status: Full Code Family Communication: None at bedside Disposition Plan: Discharge to CIR when bed is  available and if hemoglobin is stable   Consultants:  Orthopedic surgery  Procedures:  HEMIARTHROPLASTY OF RIGHT HIP (11/13/2020)  Antimicrobials: None    Subjective: No concerns overnight. No dyspnea.  Objective: Vitals:   11/14/20 2355 11/15/20 0700 11/15/20 1500 11/15/20 1956  BP:  129/75 122/81 139/67  Pulse: 80 71 86 84  Resp: '20 17 18 18  '$ Temp:  98.9 F (37.2 C) 98.6 F (37 C) 99.1 F (37.3 C)  TempSrc:  Oral Oral Oral  SpO2: 100% 96% 95% 98%  Weight:      Height:        Intake/Output Summary (Last 24 hours) at 11/16/2020 0847 Last data filed at 11/16/2020 0109 Gross per 24 hour  Intake 480 ml  Output 1400 ml  Net -920 ml   Filed Weights   11/12/20 0500  Weight: 68.8 kg    Examination:  General exam: Appears calm and comfortable Respiratory system: Clear to auscultation. Respiratory effort normal. Cardiovascular system: S1 & S2 heard, RRR. No murmurs, rubs, gallops or clicks. Gastrointestinal system: Abdomen is nondistended, soft and nontender. No organomegaly or masses felt. Normal bowel sounds heard. Central nervous system: Alert and oriented. No focal neurological deficits. Musculoskeletal: Right leg immobilizer. Right lateral thigh is edematous with no obvious evidence of hematoma Skin: No cyanosis. No rashes Psychiatry: Judgement and insight appear normal. Mood & affect appropriate.     Data Reviewed: I have personally reviewed following labs and imaging studies  CBC Lab Results  Component Value Date   WBC 10.1 11/16/2020   RBC 2.57 (L) 11/16/2020   HGB 8.1 (L) 11/16/2020   HCT 25.3 (L) 11/16/2020   MCV 98.4 11/16/2020   MCH 31.5 11/16/2020  PLT 222 11/16/2020   MCHC 32.0 11/16/2020   RDW 16.1 (H) 11/16/2020   LYMPHSABS 1.0 11/11/2020   MONOABS 0.7 11/11/2020   EOSABS 0.0 11/11/2020   BASOSABS 0.1 0000000     Last metabolic panel Lab Results  Component Value Date   NA 136 11/15/2020   K 3.6 11/15/2020   CL 103 11/15/2020    CO2 25 11/15/2020   BUN 17 11/15/2020   CREATININE 1.21 11/15/2020   GLUCOSE 113 (H) 11/15/2020   GFRNONAA 57 (L) 11/15/2020   GFRAA 46 (L) 10/06/2019   CALCIUM 8.5 (L) 11/15/2020   PROT 7.3 11/11/2020   ALBUMIN 4.1 11/11/2020   LABGLOB 2.5 09/23/2020   AGRATIO 1.7 09/23/2020   BILITOT 0.7 11/11/2020   ALKPHOS 84 11/11/2020   AST 28 11/11/2020   ALT 15 11/11/2020   ANIONGAP 8 11/15/2020    CBG (last 3)  No results for input(s): GLUCAP in the last 72 hours.   GFR: Estimated Creatinine Clearance: 39.5 mL/min (by C-G formula based on SCr of 1.21 mg/dL).  Coagulation Profile: Recent Labs  Lab 11/12/20 0416  INR 1.2    Recent Results (from the past 240 hour(s))  Resp Panel by RT-PCR (Flu A&B, Covid)     Status: Abnormal   Collection Time: 11/11/20  8:14 PM   Specimen: Nasopharyngeal(NP) swabs in vial transport medium  Result Value Ref Range Status   SARS Coronavirus 2 by RT PCR POSITIVE (A) NEGATIVE Final    Comment: CRITICAL RESULT CALLED TO, READ BACK BY AND VERIFIED WITH: TURNER,C 2154 11/11/2020 COLEMAN,R (NOTE) SARS-CoV-2 target nucleic acids are DETECTED.  The SARS-CoV-2 RNA is generally detectable in upper respiratory specimens during the acute phase of infection. Positive results are indicative of the presence of the identified virus, but do not rule out bacterial infection or co-infection with other pathogens not detected by the test. Clinical correlation with patient history and other diagnostic information is necessary to determine patient infection status. The expected result is Negative.  Fact Sheet for Patients: EntrepreneurPulse.com.au  Fact Sheet for Healthcare Providers: IncredibleEmployment.be  This test is not yet approved or cleared by the [place] FDA and  has been authorized for detection and/or diagnosis of SARS-CoV-2 by FDA under an Emergency Use Authorization (EUA).  This EUA will remain in  effect (meaning this t est can be used) for the duration of  the COVID-19 declaration under Section 564(b)(1) of the Act, 21 U.S.C. section 360bbb-3(b)(1), unless the authorization is terminated or revoked sooner.     Influenza A by PCR NEGATIVE NEGATIVE Final   Influenza B by PCR NEGATIVE NEGATIVE Final    Comment: (NOTE) The Xpert Xpress SARS-CoV-2/FLU/RSV plus assay is intended as an aid in the diagnosis of influenza from Nasopharyngeal swab specimens and should not be used as a sole basis for treatment. Nasal washings and aspirates are unacceptable for Xpert Xpress SARS-CoV-2/FLU/RSV testing.  Fact Sheet for Patients: EntrepreneurPulse.com.au  Fact Sheet for Healthcare Providers: IncredibleEmployment.be  This test is not yet approved or cleared by the [place] FDA and has been authorized for detection and/or diagnosis of SARS-CoV-2 by FDA under an Emergency Use Authorization (EUA). This EUA will remain in effect (meaning this test can be used) for the duration of the COVID-19 declaration under Section 564(b)(1) of the Act, 21 U.S.C. section 360bbb-3(b)(1), unless the authorization is terminated or revoked.  Performed at [facility], [address] [place], [place] [address]   Surgical pcr screen     Status:  None   Collection Time: 11/11/20 11:44 PM   Specimen: Nasal Mucosa; Nasal Swab  Result Value Ref Range Status   MRSA, PCR NEGATIVE NEGATIVE Final   Staphylococcus aureus NEGATIVE NEGATIVE Final    Comment: (NOTE) The Xpert SA Assay (FDA approved for NASAL specimens in patients 20 years of age and older), is one component of a comprehensive surveillance program. It is not intended to diagnose infection nor to guide or monitor treatment. Performed at [facility], [address], [place], [place] [address]         Radiology Studies: DG CHEST PORT 1 VIEW  Result Date: 11/15/2020 CLINICAL DATA:  Productive cough. EXAM:  PORTABLE CHEST 1 VIEW COMPARISON:  July 14, 2020. FINDINGS: Stable cardiomediastinal silhouette. Status post coronary artery bypass graft. Stable large hiatal hernia. No pneumothorax is noted. Right lung is clear. Minimal left basilar subsegmental atelectasis is noted. Bony thorax is unremarkable. IMPRESSION: Stable large hiatal hernia. Minimal left basilar sub some atelectasis. Electronically Signed   By: Marijo Conception M.D.   On: 11/15/2020 14:59        Scheduled Meds:  aspirin EC  81 mg Oral BID   carbidopa-levodopa  1.5 tablet Oral TID   dextromethorphan-guaiFENesin  1 tablet Oral BID   docusate sodium  100 mg Oral BID   enoxaparin (LOVENOX) injection  40 mg Subcutaneous Q24H   feeding supplement  237 mL Oral TID BM   fluticasone  2 spray Each Nare QHS   multivitamin with minerals  1 tablet Oral Daily   pantoprazole  40 mg Oral BID   Continuous Infusions:  methocarbamol (ROBAXIN) IV       LOS: 5 days     Cordelia Poche, MD [facility] 11/16/2020, 8:47 AM  If 7PM-7AM, please contact night-coverage [email or web address]

## 2020-11-16 NOTE — Progress Notes (Signed)
Inpatient Rehab Admissions Coordinator:   I spoke with pt. And wife regarding potential CIR admission. They are interested. Pt.'s wife can provide 24/7 supervision, but Pt. Is likely to need physical assist she cannot provide at d/c. He lives with 2 sons so I am reaching out to them to see if they are able to provide the physical assistance he is likely to need at d/c. I left a voicemail for Pt.s son, Sula Soda, and await a callback.  Clemens Catholic, [place], [place] Admissions Coordinator  [phone] ([place]) [phone] (office)

## 2020-11-16 NOTE — Care Management Important Message (Signed)
Important Message  Patient Details  Name: Jon Reid MRN: [id number] Date of Birth: 12-15-1928   Medicare Important Message Given:        Orbie Pyo 11/16/2020, 3:15 PM

## 2020-11-16 NOTE — Progress Notes (Signed)
Physical Therapy Treatment Patient Details Name: Jon Reid MRN: [id number] DOB: 1928/08/19 Today's Date: 11/16/2020    History of Present Illness Jon Reid is an 87 y.o. male who was admitted 8/26 after a fall in his kitchen. Pt found with R proximal femur fracture and is now s/p hemiarthroplasty of R hip 7/28. Past medical history of stage III chronic kidney disease, coronary artery disease with    PT Comments    Patient received in recliner, pleasantly confused and motivated to participate- mumbling and coughing more today and difficult to understand at times. Needs heavy physical assist for all mobility especially due to posterior lean and safety concerns maintaining posterior hip precautions, but able to initiate gait training today. Very easily fatigued and gait pattern quickly becomes unsafe. Displays lots of classic parkinsonian traits when attempting mobility including shuffling gait pattern, posterior lean, and intermittent freezing episodes. Left up in recliner with all needs met, RN aware of pt status and need for +2 for transfers in stedy for back to bed. Will continue efforts.     Follow Up Recommendations  CIR;Supervision/Assistance - 24 hour     Equipment Recommendations  Rolling walker with 5" wheels;3in1 (PT);Wheelchair (measurements PT);Wheelchair cushion (measurements PT)    Recommendations for Other Services       Precautions / Restrictions Precautions Precautions: Posterior Hip;Fall Precaution Booklet Issued: No Precaution Comments: verbally reviewed Required Braces or Orthoses: Knee Immobilizer - Right Knee Immobilizer - Right: Other (comment) (on while in bed or resting, off for PT/OT) Restrictions Weight Bearing Restrictions: Yes RLE Weight Bearing: Weight bearing as tolerated Other Position/Activity Restrictions: posterior hip precautions R LE    Mobility  Bed Mobility               General bed mobility comments: up in recliner upon  entry    Transfers Overall transfer level: Needs assistance Equipment used: Rolling walker (2 wheeled) Transfers: Sit to/from Stand Sit to Stand: Max assist;+2 physical assistance         General transfer comment: multiple attempts and MaxAx2 to boost all the way up to standing, as well as repeated cues to maintain posterior hip precautions  Ambulation/Gait Ambulation/Gait assistance: Max assist;+2 safety/equipment Gait Distance (Feet): 5 Feet Assistive device: Rolling walker (2 wheeled) Gait Pattern/deviations: Step-to pattern;Decreased step length - right;Decreased step length - left;Shuffle;Trunk flexed;Narrow base of support;Leaning posteriorly Gait velocity: decreased.   General Gait Details: maxA to maintain balance due to heavy posterior lean, but able to initiate gait training today- gait pattern very parkinsonian with occasional freezes and slow processing/limited movement initiation but improved with simple step by step cues. Fatigues very easily and started dragging L LE due to difficult weight shifting onto RLE secondary to fatigue after gait training a couple feet. close chair follow by second person for safety.   Stairs             Wheelchair Mobility    Modified Rankin (Stroke Patients Only)       Balance Overall balance assessment: Needs assistance Sitting-balance support: Feet supported Sitting balance-Leahy Scale: Poor Sitting balance - Comments: posterior lean Postural control: Posterior lean Standing balance support: Bilateral upper extremity supported;During functional activity Standing balance-Leahy Scale: Poor Standing balance comment: BLE reliant on external support, posterior lean                            Cognition Arousal/Alertness: Awake/alert Behavior During Therapy: Flat affect Overall Cognitive Status: Impaired/Different  from baseline Area of Impairment: Orientation;Memory;Following  commands;Safety/judgement;Awareness;Problem solving                 Orientation Level: Disoriented to;Situation;Time   Memory: Decreased recall of precautions;Decreased short-term memory Following Commands: Follows one step commands with increased time;Follows one step commands inconsistently Safety/Judgement: Decreased awareness of safety;Decreased awareness of deficits Awareness: Intellectual Problem Solving: Slow processing;Decreased initiation;Difficulty sequencing;Requires verbal cues;Requires tactile cues General Comments: oriented to person and place, but unable to tell me the month  or year (persevrates on August [birth year]th being his mother's birthday). Less tangential today but mumbling more and difficult to understand. Poor insight into safety/deficits and needed multimodal cues  and hand over hand placement for safety and sequencing today.      Exercises      General Comments        Pertinent Vitals/Pain Pain Assessment: No/denies pain    Home Living                      Prior Function            PT Goals (current goals can now be found in the care plan section) Acute Rehab PT Goals Patient Stated Goal: get back to independence PT Goal Formulation: With patient Time For Goal Achievement: 11/28/20 Potential to Achieve Goals: Good Progress towards PT goals: Progressing toward goals    Frequency    Min 3X/week      PT Plan Current plan remains appropriate    Co-evaluation              AM-PAC PT "6 Clicks" Mobility   Outcome Measure  Help needed turning from your back to your side while in a flat bed without using bedrails?: A Lot Help needed moving from lying on your back to sitting on the side of a flat bed without using bedrails?: Total Help needed moving to and from a bed to a chair (including a wheelchair)?: Total Help needed standing up from a chair using your arms (e.g., wheelchair or bedside chair)?: Total Help needed to walk in  hospital room?: Total Help needed climbing 3-5 steps with a railing? : Total 6 Click Score: 7    End of Session Equipment Utilized During Treatment: Gait belt Activity Tolerance: Patient tolerated treatment well Patient left: in chair;with call bell/phone within reach;with chair alarm set Nurse Communication: Mobility status;Precautions;Weight bearing status;Need for lift equipment (stedy) PT Visit Diagnosis: Muscle weakness (generalized) (M62.81);Difficulty in walking, not elsewhere classified (R26.2);Other abnormalities of gait and mobility (R26.89)     Time: 1610-9604 PT Time Calculation (min) (ACUTE ONLY): 19 min  Charges:  $Gait Training: 8-22 mins                    Windell Norfolk, DPT, PN2   Supplemental Physical Therapist [place]    Pager [phone] Acute Rehab Office [phone]

## 2020-11-17 ENCOUNTER — Encounter (HOSPITAL_COMMUNITY): Payer: Self-pay | Admitting: Physical Medicine and Rehabilitation

## 2020-11-17 ENCOUNTER — Inpatient Hospital Stay (HOSPITAL_COMMUNITY): Payer: MEDICARE

## 2020-11-17 ENCOUNTER — Other Ambulatory Visit: Payer: Self-pay

## 2020-11-17 ENCOUNTER — Inpatient Hospital Stay (HOSPITAL_COMMUNITY)
Admission: RE | Admit: 2020-11-17 | Discharge: 2020-12-08 | DRG: 559 | Disposition: A | Discharge status: Home Health | Payer: MEDICARE | Source: Intra-hospital | Attending: Physical Medicine and Rehabilitation | Admitting: Physical Medicine and Rehabilitation

## 2020-11-17 DIAGNOSIS — I959 Hypotension, unspecified: Secondary | ICD-10-CM | POA: Diagnosis not present

## 2020-11-17 DIAGNOSIS — H9190 Unspecified hearing loss, unspecified ear: Secondary | ICD-10-CM | POA: Diagnosis present

## 2020-11-17 DIAGNOSIS — Z951 Presence of aortocoronary bypass graft: Secondary | ICD-10-CM | POA: Diagnosis not present

## 2020-11-17 DIAGNOSIS — W19XXXD Unspecified fall, subsequent encounter: Secondary | ICD-10-CM | POA: Diagnosis present

## 2020-11-17 DIAGNOSIS — U071 COVID-19: Secondary | ICD-10-CM | POA: Diagnosis not present

## 2020-11-17 DIAGNOSIS — R0989 Other specified symptoms and signs involving the circulatory and respiratory systems: Secondary | ICD-10-CM

## 2020-11-17 DIAGNOSIS — E43 Unspecified severe protein-calorie malnutrition: Secondary | ICD-10-CM | POA: Insufficient documentation

## 2020-11-17 DIAGNOSIS — Z09 Encounter for follow-up examination after completed treatment for conditions other than malignant neoplasm: Secondary | ICD-10-CM

## 2020-11-17 DIAGNOSIS — K219 Gastro-esophageal reflux disease without esophagitis: Secondary | ICD-10-CM | POA: Diagnosis not present

## 2020-11-17 DIAGNOSIS — L570 Actinic keratosis: Secondary | ICD-10-CM | POA: Diagnosis present

## 2020-11-17 DIAGNOSIS — N179 Acute kidney failure, unspecified: Secondary | ICD-10-CM

## 2020-11-17 DIAGNOSIS — R059 Cough, unspecified: Secondary | ICD-10-CM | POA: Diagnosis not present

## 2020-11-17 DIAGNOSIS — G20 Parkinson's disease: Secondary | ICD-10-CM | POA: Diagnosis present

## 2020-11-17 DIAGNOSIS — J690 Pneumonitis due to inhalation of food and vomit: Secondary | ICD-10-CM | POA: Diagnosis not present

## 2020-11-17 DIAGNOSIS — Z820 Family history of epilepsy and other diseases of the nervous system: Secondary | ICD-10-CM

## 2020-11-17 DIAGNOSIS — Z823 Family history of stroke: Secondary | ICD-10-CM | POA: Diagnosis not present

## 2020-11-17 DIAGNOSIS — J9811 Atelectasis: Secondary | ICD-10-CM | POA: Diagnosis present

## 2020-11-17 DIAGNOSIS — E872 Acidosis: Secondary | ICD-10-CM | POA: Diagnosis not present

## 2020-11-17 DIAGNOSIS — J90 Pleural effusion, not elsewhere classified: Secondary | ICD-10-CM | POA: Diagnosis not present

## 2020-11-17 DIAGNOSIS — Z888 Allergy status to other drugs, medicaments and biological substances status: Secondary | ICD-10-CM | POA: Diagnosis not present

## 2020-11-17 DIAGNOSIS — D649 Anemia, unspecified: Secondary | ICD-10-CM

## 2020-11-17 DIAGNOSIS — S7291XA Unspecified fracture of right femur, initial encounter for closed fracture: Secondary | ICD-10-CM | POA: Diagnosis present

## 2020-11-17 DIAGNOSIS — I350 Nonrheumatic aortic (valve) stenosis: Secondary | ICD-10-CM | POA: Diagnosis present

## 2020-11-17 DIAGNOSIS — R509 Fever, unspecified: Secondary | ICD-10-CM | POA: Diagnosis not present

## 2020-11-17 DIAGNOSIS — R1312 Dysphagia, oropharyngeal phase: Secondary | ICD-10-CM | POA: Diagnosis not present

## 2020-11-17 DIAGNOSIS — I10 Essential (primary) hypertension: Secondary | ICD-10-CM | POA: Diagnosis present

## 2020-11-17 DIAGNOSIS — Z9842 Cataract extraction status, left eye: Secondary | ICD-10-CM

## 2020-11-17 DIAGNOSIS — Z8616 Personal history of COVID-19: Secondary | ICD-10-CM

## 2020-11-17 DIAGNOSIS — Z8546 Personal history of malignant neoplasm of prostate: Secondary | ICD-10-CM

## 2020-11-17 DIAGNOSIS — K5900 Constipation, unspecified: Secondary | ICD-10-CM | POA: Diagnosis not present

## 2020-11-17 DIAGNOSIS — I2510 Atherosclerotic heart disease of native coronary artery without angina pectoris: Secondary | ICD-10-CM | POA: Diagnosis present

## 2020-11-17 DIAGNOSIS — S72001D Fracture of unspecified part of neck of right femur, subsequent encounter for closed fracture with routine healing: Principal | ICD-10-CM

## 2020-11-17 DIAGNOSIS — D62 Acute posthemorrhagic anemia: Secondary | ICD-10-CM | POA: Diagnosis not present

## 2020-11-17 DIAGNOSIS — Z96649 Presence of unspecified artificial hip joint: Secondary | ICD-10-CM

## 2020-11-17 DIAGNOSIS — K2100 Gastro-esophageal reflux disease with esophagitis, without bleeding: Secondary | ICD-10-CM

## 2020-11-17 DIAGNOSIS — G4700 Insomnia, unspecified: Secondary | ICD-10-CM | POA: Diagnosis not present

## 2020-11-17 DIAGNOSIS — R1310 Dysphagia, unspecified: Secondary | ICD-10-CM

## 2020-11-17 DIAGNOSIS — S72001S Fracture of unspecified part of neck of right femur, sequela: Secondary | ICD-10-CM

## 2020-11-17 DIAGNOSIS — L8990 Pressure ulcer of unspecified site, unspecified stage: Secondary | ICD-10-CM | POA: Insufficient documentation

## 2020-11-17 DIAGNOSIS — R296 Repeated falls: Secondary | ICD-10-CM | POA: Diagnosis present

## 2020-11-17 DIAGNOSIS — Z79899 Other long term (current) drug therapy: Secondary | ICD-10-CM

## 2020-11-17 DIAGNOSIS — L89152 Pressure ulcer of sacral region, stage 2: Secondary | ICD-10-CM | POA: Diagnosis not present

## 2020-11-17 DIAGNOSIS — D72828 Other elevated white blood cell count: Secondary | ICD-10-CM | POA: Diagnosis not present

## 2020-11-17 DIAGNOSIS — Z800 Family history of malignant neoplasm of digestive organs: Secondary | ICD-10-CM

## 2020-11-17 DIAGNOSIS — R4182 Altered mental status, unspecified: Secondary | ICD-10-CM

## 2020-11-17 DIAGNOSIS — Z7982 Long term (current) use of aspirin: Secondary | ICD-10-CM | POA: Diagnosis not present

## 2020-11-17 DIAGNOSIS — S72011A Unspecified intracapsular fracture of right femur, initial encounter for closed fracture: Secondary | ICD-10-CM

## 2020-11-17 DIAGNOSIS — E876 Hypokalemia: Secondary | ICD-10-CM | POA: Diagnosis not present

## 2020-11-17 DIAGNOSIS — R410 Disorientation, unspecified: Secondary | ICD-10-CM | POA: Diagnosis not present

## 2020-11-17 DIAGNOSIS — Z961 Presence of intraocular lens: Secondary | ICD-10-CM | POA: Diagnosis present

## 2020-11-17 DIAGNOSIS — Z6820 Body mass index (BMI) 20.0-20.9, adult: Secondary | ICD-10-CM

## 2020-11-17 DIAGNOSIS — R443 Hallucinations, unspecified: Secondary | ICD-10-CM | POA: Diagnosis not present

## 2020-11-17 DIAGNOSIS — Z8249 Family history of ischemic heart disease and other diseases of the circulatory system: Secondary | ICD-10-CM

## 2020-11-17 DIAGNOSIS — Z8673 Personal history of transient ischemic attack (TIA), and cerebral infarction without residual deficits: Secondary | ICD-10-CM

## 2020-11-17 DIAGNOSIS — K449 Diaphragmatic hernia without obstruction or gangrene: Secondary | ICD-10-CM | POA: Diagnosis not present

## 2020-11-17 DIAGNOSIS — Z96641 Presence of right artificial hip joint: Secondary | ICD-10-CM | POA: Diagnosis not present

## 2020-11-17 DIAGNOSIS — Z471 Aftercare following joint replacement surgery: Secondary | ICD-10-CM | POA: Diagnosis not present

## 2020-11-17 DIAGNOSIS — R918 Other nonspecific abnormal finding of lung field: Secondary | ICD-10-CM | POA: Diagnosis not present

## 2020-11-17 DIAGNOSIS — Z9841 Cataract extraction status, right eye: Secondary | ICD-10-CM

## 2020-11-17 LAB — CBC
HCT: 25.7 % — ABNORMAL LOW (ref 39.0–52.0)
Hemoglobin: 8.2 g/dL — ABNORMAL LOW (ref 13.0–17.0)
MCH: 31.2 pg (ref 26.0–34.0)
MCHC: 31.9 g/dL (ref 30.0–36.0)
MCV: 97.7 fL (ref 80.0–100.0)
Platelets: 224 10*3/uL (ref 150–400)
RBC: 2.63 MIL/uL — ABNORMAL LOW (ref 4.22–5.81)
RDW: 15.9 % — ABNORMAL HIGH (ref 11.5–15.5)
WBC: 10.4 10*3/uL (ref 4.0–10.5)
nRBC: 0 % (ref 0.0–0.2)

## 2020-11-17 MED ORDER — ACETAMINOPHEN 325 MG PO TABS
325.0000 mg | ORAL_TABLET | ORAL | Status: DC | PRN
  Administered 2020-11-18 – 2020-12-06 (×3): 650 mg via ORAL
  Filled 2020-11-17 (×4): qty 2

## 2020-11-17 MED ORDER — ASPIRIN 81 MG PO TBEC: DELAYED_RELEASE_TABLET | ORAL | Status: DC

## 2020-11-17 MED ORDER — FLEET ENEMA 7-19 GM/118ML RE ENEM
1.0000 | ENEMA | Freq: Once | RECTAL | Status: DC | PRN
Start: 2020-11-17 — End: 2020-12-08

## 2020-11-17 MED ORDER — POLYETHYLENE GLYCOL 3350 17 G PO PACK
17.0000 g | PACK | Freq: Every day | ORAL | Status: DC | PRN
  Administered 2020-11-25 – 2020-12-01 (×2): 17 g via ORAL
  Filled 2020-11-17 (×3): qty 1

## 2020-11-17 MED ORDER — GUAIFENESIN-DM 100-10 MG/5ML PO SYRP: 5.0000 mL | ORAL_SOLUTION | Freq: Four times a day (QID) | ORAL | Status: DC | PRN

## 2020-11-17 MED ORDER — ASPIRIN EC 81 MG PO TBEC: 81.0000 mg | DELAYED_RELEASE_TABLET | Freq: Every day | ORAL | Status: DC

## 2020-11-17 MED ORDER — BISACODYL 10 MG RE SUPP
10.0000 mg | Freq: Every day | RECTAL | Status: DC | PRN
  Administered 2020-11-26 – 2020-12-03 (×2): 10 mg via RECTAL
  Filled 2020-11-17 (×3): qty 1

## 2020-11-17 MED ORDER — ASPIRIN EC 81 MG PO TBEC: 81.0000 mg | DELAYED_RELEASE_TABLET | Freq: Two times a day (BID) | ORAL | Status: DC

## 2020-11-17 MED ORDER — ENSURE ENLIVE PO LIQD: 237.0000 mL | Freq: Three times a day (TID) | ORAL | Status: DC

## 2020-11-17 MED ORDER — ENOXAPARIN SODIUM 40 MG/0.4ML IJ SOSY: 40.0000 mg | PREFILLED_SYRINGE | INTRAMUSCULAR | Status: DC

## 2020-11-17 MED ORDER — SENNOSIDES-DOCUSATE SODIUM 8.6-50 MG PO TABS
2.0000 | ORAL_TABLET | Freq: Every day | ORAL | Status: DC
  Administered 2020-11-17 – 2020-12-07 (×21): 2 via ORAL
  Filled 2020-11-17 (×21): qty 2

## 2020-11-17 MED ORDER — PROCHLORPERAZINE MALEATE 5 MG PO TABS
5.0000 mg | ORAL_TABLET | Freq: Four times a day (QID) | ORAL | Status: DC | PRN
  Administered 2020-12-02: 10 mg via ORAL
  Filled 2020-11-17: qty 2

## 2020-11-17 MED ORDER — PROCHLORPERAZINE 25 MG RE SUPP
12.5000 mg | Freq: Four times a day (QID) | RECTAL | Status: DC | PRN
Start: 2020-11-17 — End: 2020-12-08

## 2020-11-17 MED ORDER — ENSURE ENLIVE PO LIQD
237.0000 mL | Freq: Three times a day (TID) | ORAL | Status: DC
  Administered 2020-11-17 – 2020-11-29 (×28): 237 mL via ORAL

## 2020-11-17 MED ORDER — PANTOPRAZOLE SODIUM 40 MG PO TBEC
40.0000 mg | DELAYED_RELEASE_TABLET | Freq: Two times a day (BID) | ORAL | Status: DC
  Administered 2020-11-17 – 2020-12-08 (×41): 40 mg via ORAL
  Filled 2020-11-17 (×41): qty 1

## 2020-11-17 MED ORDER — DM-GUAIFENESIN ER 30-600 MG PO TB12
1.0000 | ORAL_TABLET | Freq: Two times a day (BID) | ORAL | Status: DC
  Administered 2020-11-17 – 2020-11-22 (×10): 1 via ORAL
  Filled 2020-11-17 (×10): qty 1

## 2020-11-17 MED ORDER — ENSURE ENLIVE PO LIQD: 237.0000 mL | Freq: Three times a day (TID) | ORAL | 12 refills | Status: DC

## 2020-11-17 MED ORDER — ADULT MULTIVITAMIN W/MINERALS CH
1.0000 | ORAL_TABLET | Freq: Every day | ORAL | Status: DC
  Administered 2020-11-18 – 2020-12-08 (×21): 1 via ORAL
  Filled 2020-11-17 (×21): qty 1

## 2020-11-17 MED ORDER — DIPHENHYDRAMINE HCL 12.5 MG/5ML PO ELIX: 12.5000 mg | ORAL_SOLUTION | Freq: Four times a day (QID) | ORAL | Status: DC | PRN

## 2020-11-17 MED ORDER — ALUM & MAG HYDROXIDE-SIMETH 200-200-20 MG/5ML PO SUSP
30.0000 mL | ORAL | Status: DC | PRN
  Administered 2020-12-03: 30 mL via ORAL
  Filled 2020-11-17: qty 30

## 2020-11-17 MED ORDER — PROCHLORPERAZINE EDISYLATE 10 MG/2ML IJ SOLN
5.0000 mg | Freq: Four times a day (QID) | INTRAMUSCULAR | Status: DC | PRN
  Administered 2020-11-21: 10 mg via INTRAMUSCULAR

## 2020-11-17 MED ORDER — TRAZODONE HCL 50 MG PO TABS
25.0000 mg | ORAL_TABLET | Freq: Every evening | ORAL | Status: DC | PRN
  Administered 2020-11-22: 25 mg via ORAL
  Filled 2020-11-17: qty 1

## 2020-11-17 MED ORDER — ENOXAPARIN SODIUM 40 MG/0.4ML IJ SOSY
40.0000 mg | PREFILLED_SYRINGE | Freq: Every day | INTRAMUSCULAR | Status: DC
  Administered 2020-11-18 – 2020-11-23 (×6): 40 mg via SUBCUTANEOUS
  Filled 2020-11-17 (×6): qty 0.4

## 2020-11-17 MED ORDER — DM-GUAIFENESIN ER 30-600 MG PO TB12: 1.0000 | ORAL_TABLET | Freq: Two times a day (BID) | ORAL | Status: DC

## 2020-11-17 MED ORDER — FLUTICASONE PROPIONATE 50 MCG/ACT NA SUSP
2.0000 | Freq: Every day | NASAL | Status: DC
  Administered 2020-11-18 – 2020-12-07 (×18): 2 via NASAL
  Filled 2020-11-17: qty 16

## 2020-11-17 MED ORDER — CARBIDOPA-LEVODOPA 25-100 MG PO TABS
1.5000 | ORAL_TABLET | Freq: Three times a day (TID) | ORAL | Status: DC
  Administered 2020-11-17 – 2020-12-08 (×61): 1.5 via ORAL
  Filled 2020-11-17 (×62): qty 2

## 2020-11-17 NOTE — Progress Notes (Signed)
Inpatient Rehabilitation Medication Review by a Pharmacist  A complete drug regimen review was completed for this patient to identify any potential clinically significant medication issues.  High Risk Drug Classes Is patient taking? Indication by Medication  Antipsychotic Yes Compazine for nausea  Anticoagulant Yes Lovenox for DVT prophx  Antibiotic No   Opioid No   Antiplatelet Yes ASA BID for VTE prophx  Hypoglycemics/insulin No   Vasoactive Medication No   Chemotherapy No   Other No      Type of Medication Issue Identified Description of Issue Recommendation(s)  Drug Interaction(s) (clinically significant)     Duplicate Therapy     Allergy     No Medication Administration End Date  ASA to con't BID x 30d then decreased to once daily Orders corrected  Incorrect Dose     Additional Drug Therapy Needed  Lasix '40mg'$ /d Add Lasix '40mg'$ /d  Significant med changes from prior encounter (inform family/care partners about these prior to discharge).    Other  Not resumed, Vit C, FESO4, Zinc supplements Can resume when discharged    Clinically significant medication issues were identified that warrant physician communication and completion of prescribed/recommended actions by midnight of the next day:  Yes  Name of provider notified for urgent issues identified: [facility]  Provider Method of Notification: chat  Pharmacist comments: Please resume Lasix '40mg'$ /d as per inpatient discharge summary.  Time spent performing this drug regimen review (minutes):  10 min  Khamya Topp S. Alford Highland, PharmD, BCPS Clinical Staff Pharmacist [email or web address] [place] 11/17/2020 3:48 PM

## 2020-11-17 NOTE — Progress Notes (Signed)
Pt transferred to 4W08 as ordered. Pt remains alert/oriented in no apparent distress.

## 2020-11-17 NOTE — Progress Notes (Signed)
Consulted IP. Initiated Airborne/Contact precautions per orders. Provider aware.

## 2020-11-17 NOTE — Progress Notes (Signed)
Physical Therapy Treatment Patient Details Name: Jon Reid MRN: [id number] DOB: 12/03/1928 Today's Date: 11/17/2020    History of Present Illness 87 y.o. male who was admitted 8/26 after a fall in his kitchen. Pt found with R proximal femur fracture and is now s/p hemiarthroplasty of R hip 8/28. PMHx:  stage III chronic kidney disease, CAD, first degree heart block, GERD, HTN, prostate CA, falls, OA, CABG x 4 2014, Covid 19, gout, cataracts, macrocytosis, aortic stenosis, pseudobulbar effect, sleep apnea    PT Comments    Pt was seen for mobility on side of bed, has demonstrated inability to get there without significant help.  Has worked on standing out of his [place], but worked in it today to see how it assists his balance. Follow along with him to increase standing tolerance, to increase ability to take steps and increase RLE strength within precautions.  Pt is not able to verbalize precautions.    Follow Up Recommendations  CIR;Supervision/Assistance - 24 hour     Equipment Recommendations  Rolling walker with 5" wheels;3in1 (PT);Wheelchair (measurements PT);Wheelchair cushion (measurements PT)    Recommendations for Other Services       Precautions / Restrictions Precautions Precautions: Posterior Hip;Fall Precaution Comments: verbally reviewed Required Braces or Orthoses: Knee Immobilizer - Right Knee Immobilizer - Right: Other (comment) (in bed on and off for PT) Restrictions Weight Bearing Restrictions: Yes RLE Weight Bearing: Weight bearing as tolerated Other Position/Activity Restrictions: posterior hip precautions R LE    Mobility  Bed Mobility Overal bed mobility: Needs Assistance Bed Mobility: Supine to Sit;Sit to Supine     Supine to sit: Max assist Sit to supine: Max assist;+2 for physical assistance;+2 for safety/equipment   General bed mobility comments: pt was assisted to side of bed in brace and elected to leave it on to support standing     Transfers Overall transfer level: Needs assistance Equipment used: Rolling walker (2 wheeled) Transfers: Sit to/from Stand Sit to Stand: Max assist         General transfer comment: stood in the brace to see if it offered support and pt did do a better job controlling the RLE.  Able to side step on the bed wth one person  Ambulation/Gait Ambulation/Gait assistance: Mod assist Gait Distance (Feet): 3 Feet Assistive device: Rolling walker (2 wheeled) Gait Pattern/deviations: Step-to pattern;Decreased stride length;Decreased weight shift to right;Shuffle;Wide base of support Gait velocity: decreased.   General Gait Details: sidestepped with encouragement, did not leave in chair due to CNA arriving to prep him for CIR   Stairs             Wheelchair Mobility    Modified Rankin (Stroke Patients Only)       Balance Overall balance assessment: Needs assistance Sitting-balance support: Feet supported   Sitting balance - Comments: able to locate midline with cues   Standing balance support: Bilateral upper extremity supported;During functional activity Standing balance-Leahy Scale: Poor                              Cognition Arousal/Alertness: Awake/alert Behavior During Therapy: Flat affect Overall Cognitive Status: Impaired/Different from baseline Area of Impairment: Problem solving;Awareness;Safety/judgement;Following commands;Memory;Attention;Orientation                 Orientation Level: Situation;Time Current Attention Level: Selective Memory: Decreased recall of precautions Following Commands: Follows one step commands with increased time Safety/Judgement: Decreased awareness of deficits Awareness:  Intellectual Problem Solving: Slow processing;Requires verbal cues;Requires tactile cues General Comments: pt basically asked PT to move him to EOB as he could not marshall the effort and motor planning to do the task      Exercises       General Comments General comments (skin integrity, edema, etc.): pt is up to side of bed with help and using bed pad, but could get to a stable sitting point.      Pertinent Vitals/Pain Pain Assessment: Faces Faces Pain Scale: Hurts little more Pain Location: R hip on surgery site Pain Intervention(s): Monitored during session;Repositioned;Premedicated before session    Home Living                      Prior Function            PT Goals (current goals can now be found in the care plan section) Acute Rehab PT Goals Patient Stated Goal: go home Progress towards PT goals: Progressing toward goals    Frequency    Min 3X/week      PT Plan Current plan remains appropriate    Co-evaluation              AM-PAC PT "6 Clicks" Mobility   Outcome Measure  Help needed turning from your back to your side while in a flat bed without using bedrails?: A Lot Help needed moving from lying on your back to sitting on the side of a flat bed without using bedrails?: A Lot Help needed moving to and from a bed to a chair (including a wheelchair)?: A Lot Help needed standing up from a chair using your arms (e.g., wheelchair or bedside chair)?: A Lot Help needed to walk in hospital room?: A Lot Help needed climbing 3-5 steps with a railing? : Total 6 Click Score: 11    End of Session Equipment Utilized During Treatment: Gait belt Activity Tolerance: Patient tolerated treatment well Patient left: in bed;with call bell/phone within reach;with bed alarm set Nurse Communication: Mobility status PT Visit Diagnosis: Muscle weakness (generalized) (M62.81);Difficulty in walking, not elsewhere classified (R26.2);Other abnormalities of gait and mobility (R26.89)     Time: [id number] PT Time Calculation (min) (ACUTE ONLY): 33 min  Charges:  $Therapeutic Activity: 23-37 mins              Ramond Dial 11/17/2020, 1:25 PM  Mee Hives, PT MS Acute Rehab Dept. Number: [place]  and [place]

## 2020-11-17 NOTE — Progress Notes (Signed)
IP rehab admissions - I spoke with patient, with his wife and with a son, [name].  Wife stays home and can provide supervision.  Two sons, Jeani Hawking and [name], are in and out but can assist because they live with patient.  Wife prefers inpatient rehab here in the hospital.  Patient is agreeable to CIR as well.  I do have beds available today.  Please let me know if patient is medically ready for inpatient rehab today.  Call for questions.  [phone]

## 2020-11-17 NOTE — H&P (Signed)
Physical Medicine and Rehabilitation Admission H&P    Chief Complaint  Patient presents with   Functional deficits due to fall with hip fracture  : HPI: Jon Reid is a 87  year old male with history of prostate CA, CKD, HTN, CAD, Parkinson's disease, spinal stenosis who was admitted on 11/11/20 after a fall onto right hip with subsequent right subcapital hip fracture.  He was also found to be SARS-CoV-2 positive without symptoms and felt to be older infection therefore isolation not required per ID.  He underwent right hip hemiarthroplasty the same day by Dr. Percell Miller and postop is WBAT.  He has had issues with confusion as well as lethargy, acute blood loss anemia, shortness of breath with chest pain felt to be due to GERD as well as reactive leukocytosis without signs of infection.  He did report question of coughing with aspiration and chest x-ray done on 08/30 which showed minimal left basilar atelectasis and stable large hiatal hernia.  WBC trending down and acute blood loss anemia stable.  Therapy is ongoing and patient continues to be limited by confusion with mumbling speech at times, shuffling gait with intermittent freezing episodes, posterior lean, inability to maintain hip precautions as well as poor awareness of deficits.  CIR was recommended due to functional decline.  Pt reports needs to have a BM; also has had a "cough" for a few days since choked on a piece of food- makes him a little scared to eat.  Voiding fine with condom catheter.     Review of Systems  Constitutional:  Negative for fever.  HENT:  Positive for hearing loss.   Eyes: Negative.   Respiratory:  Positive for cough and sputum production. Negative for shortness of breath.   Cardiovascular:  Positive for leg swelling. Negative for chest pain.  Gastrointestinal:  Positive for constipation. Negative for nausea and vomiting.  Genitourinary:  Negative for dysuria and hematuria.  Musculoskeletal:  Positive for  falls. Negative for joint pain.  Skin: Negative.   Neurological:  Positive for tremors. Negative for dizziness and headaches.  Endo/Heme/Allergies: Negative.   Psychiatric/Behavioral:  Negative for depression. The patient is not nervous/anxious and does not have insomnia.   All other systems reviewed and are negative.   Past Medical History:  Diagnosis Date   Arthritis    Carcinoma of prostate ([place])    prostate   CKD (chronic kidney disease), stage III (HCC)    Coronary artery disease    a. 10/2012 Cath: LM 20-30d, lAD 80p, 44m 80-90d, LCX 90-970mOM1 90, RCA 50p, 2051m0d, RPDA 90, EF nl; b. 10/2012 CABG x 4 (LIMA-LAD, SVG-OM1, SVG-PDA->RPL); c. 07/2015 MV: Basal and proximal septal infarct w/o ischemia. EF 57%-->low risk.   First degree AV block 01/28/2019   Noted on EKG   GERD (gastroesophageal reflux disease)    Gout    HTN (hypertension)    Macrocytosis without anemia 01/08/2012   Moderate aortic stenosis    a. 10/2012 Echo: EF 55-60%, no rwma, BAE, mild AI, mild TR; b. 06/2020 Echo: EF 50-55%, no rwma, mild LVH. Mod dil LA. Mild MR. Mod AS.   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   Sleep apnea    Questionable   Spinal stenosis    [facility]    Past Surgical History:  Procedure Laterality Date   BALLOON DILATION N/A 10/03/2020  Procedure: BALLOON DILATION;  Surgeon: [name], DO;  [facility];  Service: Endoscopy;  Laterality: N/A;   CARDIAC CATHETERIZATION  11/07/2012   Dr Acie Fredrickson   CATARACT EXTRACTION W/ INTRAOCULAR LENS IMPLANT Bilateral    CORONARY ARTERY BYPASS GRAFT N/A 11/11/2012   Procedure: CORONARY ARTERY BYPASS GRAFTING times four using Right Greater Saphenous Vein Graft harvested endoscopically and Left Internal Mammary Artery.;  Surgeon: Ivin Poot, MD;  Location: [place];  Service: Open Heart Surgery;  Laterality: N/A;   ESOPHAGEAL BRUSHING  10/03/2020    Procedure: ESOPHAGEAL BRUSHING;  Surgeon: [name], DO;  [facility];  Service: Endoscopy;;   ESOPHAGOGASTRODUODENOSCOPY (EGD) WITH PROPOFOL N/A 06/27/2020   Surgeon: [name], DO;  large hiatal hernia, LA grade D esophagitis with no bleeding, normal examined duodenum.    ESOPHAGOGASTRODUODENOSCOPY (EGD) WITH PROPOFOL N/A 10/03/2020   Procedure: ESOPHAGOGASTRODUODENOSCOPY (EGD) WITH PROPOFOL;  Surgeon: [name], DO;  [facility];  Service: Endoscopy;  Laterality: N/A;  2:00pm   HIP ARTHROPLASTY Right 11/13/2020   Procedure: ARTHROPLASTY BIPOLAR HIP (HEMIARTHROPLASTY);  Surgeon: Renette Butters, MD;  Location: [place];  Service: Orthopedics;  Laterality: Right;   INGUINAL HERNIA REPAIR Right 01/10/2017   Procedure: OPEN RIGHT HERNIA REPAIR INGUINAL;  Surgeon: Ileana Roup, MD;  Location: [facility];  Service: General;  Laterality: Right;   INSERTION OF MESH Right 01/10/2017   Procedure: INSERTION OF MESH;  Surgeon: Ileana Roup, MD;  Location: [facility];  Service: General;  Laterality: Right;   INTRAOPERATIVE TRANSESOPHAGEAL ECHOCARDIOGRAM N/A 11/11/2012   Procedure: INTRAOPERATIVE TRANSESOPHAGEAL ECHOCARDIOGRAM;  Surgeon: Ivin Poot, MD;  Location: [place];  Service: Open Heart Surgery;  Laterality: N/A;   LEFT HEART CATHETERIZATION WITH CORONARY ANGIOGRAM N/A 11/07/2012   Procedure: LEFT HEART CATHETERIZATION WITH CORONARY ANGIOGRAM;  Surgeon: Thayer Headings, MD;  [facility]: [facility];  Service: Cardiovascular;  Laterality: N/A;   LUMBAR LAMINECTOMY/DECOMPRESSION MICRODISCECTOMY N/A 02/05/2019   Procedure: LUMBAR LAMINECTOMY/DECOMPRESSION L3-L4;  Surgeon: Latanya Maudlin, MD;  Location: [facility];  Service: Orthopedics;  Laterality: N/A;  85mn   LYMPH NODE DISSECTION     Bilateral pelvic   RETROPUBIC PROSTATECTOMY     Radical    Family History  Problem Relation Age of Onset   Aneurysm Father        Cerebral   Stroke Father     Hypertension Father    Cancer Mother        colon   Parkinson's disease Son     Social History: Married. Independent PTA without AD. 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 He reports that he has never smoked. He has never used smokeless tobacco. He reports current alcohol use. He reports that he does not use drugs.   Allergies  Allergen Reactions   Crestor [Rosuvastatin] Swelling    GYNECOMASTIA     Medications Prior to Admission  Medication Sig Dispense Refill   ascorbic acid (VITAMIN C) 500 MG tablet Take 1 tablet (500 mg total) by mouth daily. 30 tablet 1   aspirin EC 81 MG tablet Take 81 mg by mouth daily.     carbidopa-levodopa (SINEMET IR) 25-100 MG tablet Take 1.5 tablets by mouth 3 (three) times daily. 405 tablet 3   cephALEXin (KEFLEX) 500 MG capsule Take 1 capsule (500 mg total) by mouth 2 (two) times daily. 14 capsule 0   fluticasone (FLONASE) 50 MCG/ACT nasal spray One to  2 sprays each nostril at bedtime (Patient taking differently: Place 2 sprays into both nostrils at bedtime.) 16 g 6   furosemide (LASIX) 40 MG tablet TAKE 1 TABLET DAILY (Patient taking differently: Take 40 mg by mouth daily.) 90 tablet 0   Iron, Ferrous Sulfate, 325 (65 Fe) MG TABS Take 1 tablet by mouth daily. (Patient taking differently: Take 325 mg by mouth daily.) 30 tablet 3   nitroGLYCERIN (NITROSTAT) 0.4 MG SL tablet Place 1 tablet (0.4 mg total) under the tongue every 5 (five) minutes as needed for chest pain. 25 tablet PRN   pantoprazole (PROTONIX) 40 MG tablet Take 1 tablet (40 mg total) by mouth 2 (two) times daily. TAKE 1 TABLET ONCE DAILY FOR REFLUX (Patient taking differently: Take 40 mg by mouth 2 (two) times daily.) 60 tablet 2   triamcinolone cream (KENALOG) 0.1 % Apply 1 application topically daily as needed (itching).     zinc sulfate 220 (50 Zn) MG capsule TAKE ONE CAPSULE ONCE DAILY (Patient taking differently: Take 220 mg by mouth daily.) 30 capsule 1    Cholecalciferol (VITAMIN D) 2000 units CAPS Take 2,000 Units by mouth daily. (Patient not taking: No sig reported)      Drug Regimen Review  Drug regimen was reviewed and remains appropriate with no significant issues identified  Home: Home Living Family/patient expects to be discharged to:: Private residence Living Arrangements: Spouse/significant other, Children (has 3 sons that are local) Available Help at Discharge: Family, Available PRN/intermittently Type of Home: House Home Access: Stairs to enter [employer] of Steps: 1 step in the back Home Layout: Able to live on main level with bedroom/bathroom, Two level Alternate Level Stairs-Number of Steps: flight Bathroom Shower/Tub: Chiropodist:  (unclear) Home Equipment: Environmental consultant - 2 wheels, Cane - single point, Grab bars - tub/shower Additional Comments: has lots of DME but does not use it ; has had 10-11 falls recently  Lives With: Significant other, Son   Functional History: Prior Function Level of Independence: Independent with assistive device(s) Comments: Pt seems to be a poor historian with an altered timeline of events. However he states that he was walking "laps" in [employer], indep in all ADL/IADLs PTA. Then states he has been sick, lost 35lbs and has had multiple falls.  Functional Status:  Mobility: Bed Mobility Overal bed mobility: Needs Assistance Bed Mobility: Supine to Sit, Sit to Supine Supine to sit: Max assist Sit to supine: Max assist, +2 for physical assistance, +2 for safety/equipment General bed mobility comments: pt was assisted to side of bed in brace and elected to leave it on to support standing Transfers Overall transfer level: Needs assistance Equipment used: Rolling walker (2 wheeled) Transfers: Sit to/from Stand Sit to Stand: Max assist Stand pivot transfers: Max assist, +2 safety/equipment, +2 physical assistance General transfer comment: stood in the brace to see if  it offered support and pt did do a better job controlling the RLE.  Able to side step on the bed wth one person Ambulation/Gait Ambulation/Gait assistance: Mod assist Gait Distance (Feet): 3 Feet Assistive device: Rolling walker (2 wheeled) Gait Pattern/deviations: Step-to pattern, Decreased stride length, Decreased weight shift to right, Shuffle, Wide base of support General Gait Details: sidestepped with encouragement, did not leave in chair due to CNA arriving to prep him for CIR Gait velocity: decreased.    ADL: ADL Overall ADL's : Needs assistance/impaired Eating/Feeding: Independent, Sitting Grooming: Set up, Sitting Upper Body Bathing: Set up, Sitting Lower Body Bathing: Maximal  assistance, +2 for safety/equipment, +2 for physical assistance, Sit to/from stand Upper Body Dressing : Set up, Sitting General ADL Comments: Pt currently limited by pain, generalized weakness and problem solving  Cognition: Cognition Overall Cognitive Status: Impaired/Different from baseline Orientation Level: Oriented X4 Cognition Arousal/Alertness: Awake/alert Behavior During Therapy: Flat affect Overall Cognitive Status: Impaired/Different from baseline Area of Impairment: Problem solving, Awareness, Safety/judgement, Following commands, Memory, Attention, Orientation Orientation Level: Situation, Time Current Attention Level: Selective Memory: Decreased recall of precautions Following Commands: Follows one step commands with increased time Safety/Judgement: Decreased awareness of deficits Awareness: Intellectual Problem Solving: Slow processing, Requires verbal cues, Requires tactile cues General Comments: pt basically asked PT to move him to EOB as he could not marshall the effort and motor planning to do the task   Blood pressure (!) 102/49, pulse 76, temperature 98.4 F (36.9 C), temperature source Oral, resp. rate 18, height 6' (1.829 m), weight 68.8 kg, SpO2 99 %. Physical  Exam Vitals and nursing note reviewed.  Constitutional:      Appearance: Normal appearance.     Comments: Frail elderly male. Sitting up in bed; appropriate ; occ resting tremor seen, NAD; HOH  HENT:     Head: Normocephalic and atraumatic.     Right Ear: External ear normal.     Left Ear: External ear normal.     Nose: Nose normal. No congestion.     Mouth/Throat:     Mouth: Mucous membranes are dry.     Pharynx: Oropharynx is clear. No oropharyngeal exudate.  Eyes:     General:        Right eye: No discharge.        Left eye: No discharge.     Extraocular Movements: Extraocular movements intact.  Cardiovascular:     Rate and Rhythm: Normal rate and regular rhythm.     Heart sounds: Normal heart sounds. No murmur heard.   No gallop.  Pulmonary:     Comments: Occasional congested cough noted. Decreased BS at bases. Also has a productive cough of frothy white sputum- a few rhonchi- cleared, but constantly clearing throat.  Abdominal:     Comments: Slightly distended; soft, hypoactive bs; NT  Genitourinary:    Comments: Condom catheter- medium amber urine in bag Musculoskeletal:     Cervical back: Normal range of motion. No rigidity.     Comments: Right hip incision covered with steri-strips.  Moderate edema, no erythema seen.  UE 5-/5 B/L RLE- HF 2-/5- in brace from mid thigh to R ankle- cannot test- knee; DF/PF 4-/5 LLE- 5-/5  Skin:    Comments: Bilateral forearms with multiple ecchymotic areas.  L forearm iv- looks ok- but pulling at skin Peeling skin on top of head with actinic keratosis.   Neurological:     Mental Status: He is alert and oriented to person, place, and time.     Comments: Decreased hearing. Speech clear. Able to follow simple commands without difficulty. Able to state age but had difficulty recalling month and day "Wed".  Did remember had an EGD lately for blood Intact to light touch x 4  Psychiatric:     Comments: Interactive; HOH    Results for  orders placed or performed during the hospital encounter of 11/11/20 (from the past 48 hour(s))  CBC     Status: Abnormal   Collection Time: 11/16/20 12:55 AM  Result Value Ref Range   WBC 10.1 4.0 - 10.5 K/uL   RBC 2.57 (L) 4.22 - 5.81  MIL/uL   Hemoglobin 8.1 (L) 13.0 - 17.0 g/dL   HCT 25.3 (L) 39.0 - 52.0 %   MCV 98.4 80.0 - 100.0 fL   MCH 31.5 26.0 - 34.0 pg   MCHC 32.0 30.0 - 36.0 g/dL   RDW 16.1 (H) 11.5 - 15.5 %   Platelets 222 150 - 400 K/uL   nRBC 0.0 0.0 - 0.2 %    Comment: Performed at [facility] [address], [place], [place] [address]  CBC     Status: Abnormal   Collection Time: 11/17/20  1:16 AM  Result Value Ref Range   WBC 10.4 4.0 - 10.5 K/uL   RBC 2.63 (L) 4.22 - 5.81 MIL/uL   Hemoglobin 8.2 (L) 13.0 - 17.0 g/dL   HCT 25.7 (L) 39.0 - 52.0 %   MCV 97.7 80.0 - 100.0 fL   MCH 31.2 26.0 - 34.0 pg   MCHC 31.9 30.0 - 36.0 g/dL   RDW 15.9 (H) 11.5 - 15.5 %   Platelets 224 150 - 400 K/uL   nRBC 0.0 0.0 - 0.2 %    Comment: Performed at [facility], [place] [address] [place], [place] [address]   No results found.     Medical Problem List and Plan: 1.  R hip fx s/p hemiarthroplasty secondary to multiple falls  -patient may  shower if covered -ELOS/Goals: 16-18 days- min Assist 2.  Antithrombotics: -DVT/anticoagulation:  Pharmaceutical: Lovenox  -antiplatelet therapy: On ASA bid for DVT prophylaxis?-->decrease to once a day as on lovenox.  3. Pain Management: Hydrocodone (limit narcotics) or tylenol prn.  4. Mood: LCSW to follow for evaluation and  support.   -antipsychotic agents: N/A 5. Neuropsych: This patient is not fully capable of making decisions on his own behalf. 6. Skin/Wound Care: Monitor wound for healing.  7. Fluids/Electrolytes/Nutrition: Monitor I/O. Check lytes in am. 8. Cough: New due to question aspiration event--Has been on Mucinex since 08/30-->triggered Covid precautions by nurse  --ID contacted for input/clarity  --will add  supervision with meals and reflux precautions (large [place]) 9. GERD: Continue Protonix BID 10. Parkinson's disease: Managed with Sinemet IR TID 11. CAD s/p CABG: Monitor for symptoms with increase in activity. Continue ASA.   --hold lasix due to intermittent low BP.  12. Acute on chronic anemia due to blood loss anemia: Recheck CBC in am.   --resume iron supplement.  13. Constipation: Will add Senna S.       Bary Leriche, PA-C 11/17/2020   I have personally performed a face to face diagnostic evaluation of this patient and formulated the key components of the plan.  Additionally, I have personally reviewed laboratory data, imaging studies, as well as relevant notes and concur with the physician assistant's documentation above.   The patient's status has not changed from the original H&P.  Any changes in documentation from the acute care chart have been noted above.

## 2020-11-17 NOTE — Progress Notes (Signed)
Courtney Heys, MD   Physician  Nursing  PMR Pre-admission      Signed  Date of Service:  11/16/2020  3:16 PM       Related encounter: ED to Hosp-Admission (Discharged) from 11/11/2020 in [facility]                                                                                                                                                                                                                                                                                                                                                                                                                                                                                                               PMR Admission Coordinator Pre-Admission Assessment   Patient: Jon Reid is an 87 y.o., male MRN: [id number] DOB: 06-24-1928 Height: 6' (182.9 cm) Weight: 68.8 kg   Insurance Information HMO:     PPO:      PCP:      IPA:  80/20: yes     OTHER:  PRIMARY: [employer]  for Part A and Medicare Part B   Policy#: [id number]      Subscriber: Pt Medicare       Policy#:       Subscriber: Pt. Phone#: Verified online    Fax#:  Pre-Cert#:       Employer:  Benefits:  Phone #:      Name:  Eff. Date: Parts A ad B effective 07/18/1995  Deduct: $0865      Out of Pocket Max:  None      Life Max: N/A  CIR: 100%      SNF: 100 days Outpatient: 80%     Co-Pay: 20% Home Health: 100%      Co-Pay: none DME: 80%     Co-Pay: 20% Providers: patient's choice  SECONDARY: UHC Indemnity      Policy#: [id number]     Phone#:    Financial Counselor:       Phone#:    The "Data Collection Information Summary" for patients in Inpatient Rehabilitation Facilities with attached "Privacy Act [place]  Records" was provided and verbally reviewed with: Patient   Emergency Contact Information Contact Information       Name Relation Home Work Mobile    [place] Son [phone]   [phone]    DARNEL, MCHAN [phone]   [phone]    Vitanza,Betty Spouse [phone]   [phone]           Current Medical History  Patient Admitting Diagnosis: R femur fracture   History of Present Illness: Jon Reid is a 87 y.o. male with a history of stage III chronic kidney disease, coronary artery disease with history of first-degree block, GERD, hypertension, history of prostate cancer.  Patient presented to the ED 11/11/20 after a fall.   Patient landed on his right hip and had immediate pain.  Pain increased with movement and improved with rest.    He was brought to the emergency department for evaluation.  Imaging revealed subcapital right hip fracture with varus angulation and foreshortening. Pt. Underwent R hemiarthroplasty on 11/13/20 with Orthopedic Surgery. Pt. Tested positive for COVID 19 on PCR on 11/11/20; however, Pt. Was seen by Infectious Disease who noted that Pt. Has a history of positive COVID tests since infection in April 2022 and felt that it was a false positive related to his old infection. Pt. Has had no respiratory symptoms during this admission. CIR was consulted to assist return to PLOF.    Patient's medical record from [facility] has been reviewed by the rehabilitation admission coordinator and physician.   Past Medical History      Past Medical History:  Diagnosis Date   Arthritis     Carcinoma of prostate ([place])      prostate   CKD (chronic kidney disease), stage III ([place])     Coronary artery disease      a. 10/2012 Cath: LM 20-30d, lAD 80p, 75m 80-90d, LCX 90-965mOM1 90, RCA 50p, 2029m0d, RPDA 90, EF nl; b. 10/2012 CABG x 4 (LIMA-LAD, SVG-OM1, SVG-PDA->RPL); c. 07/2015 MV: Basal and proximal septal infarct w/o ischemia. EF 57%-->low risk.    First degree AV block 01/28/2019    Noted on EKG   GERD (gastroesophageal reflux disease)     Gout     HTN (hypertension)     Macrocytosis without anemia 01/08/2012   Moderate aortic stenosis      a. 10/2012 Echo: EF  55-60%, no rwma, BAE, mild AI, mild TR; b. 06/2020 Echo: EF 50-55%, no rwma, mild LVH. Mod dil LA. Mild MR. Mod AS.   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 Yes   Family History   family history includes Aneurysm in his father; Cancer in his mother; Hypertension in his father; Parkinson's disease in his son; Stroke in his father.   Current Medications   Current Facility-Administered Medications:    acetaminophen (TYLENOL) tablet 325-650 mg, 325-650 mg, Oral, Q6H PRN, Gawne, Meghan M, PA-C   alum & mag hydroxide-simeth (MAALOX/MYLANTA) 200-200-20 MG/5ML suspension 30 mL, 30 mL, Oral, Q4H PRN, Gawne, Meghan M, PA-C   aspirin EC tablet 81 mg, 81 mg, Oral, BID, Aggie Moats M, PA-C, 81 mg at 11/17/20 8768   bisacodyl (DULCOLAX) suppository 10 mg, 10 mg, Rectal, Daily PRN, Gawne, Meghan M, PA-C   carbidopa-levodopa (SINEMET IR) 25-100 MG per tablet immediate release 1.5 tablet, 1.5 tablet, Oral, TID, Gawne, Meghan M, PA-C, 1.5 tablet at 11/17/20 0904   dextromethorphan-guaiFENesin ([place] DM) 30-600 MG per 12 hr tablet 1 tablet, 1 tablet, Oral, BID, Shawna Clamp, MD, 1 tablet at 11/17/20 0904   docusate sodium (COLACE) capsule 100 mg, 100 mg, Oral, BID, Gawne, Meghan M, PA-C, 100 mg at 11/17/20 0905   enoxaparin (LOVENOX) injection 40 mg, 40 mg, Subcutaneous, Q24H, Gawne, Meghan M, PA-C, 40 mg at 11/17/20 0900   feeding supplement (ENSURE ENLIVE / ENSURE PLUS) liquid 237 mL, 237 mL, Oral, TID BM, Arrien, Jimmy Picket, MD, 237 mL at 11/17/20 1100    fluticasone (FLONASE) 50 MCG/ACT nasal spray 2 spray, 2 spray, Each Nare, QHS, Gawne, Meghan M, PA-C, 2 spray at 11/16/20 2147   HYDROcodone-acetaminophen (NORCO) 7.5-325 MG per tablet 1-2 tablet, 1-2 tablet, Oral, Q4H PRN, Gawne, Meghan M, PA-C   HYDROcodone-acetaminophen (NORCO/VICODIN) 5-325 MG per tablet 1-2 tablet, 1-2 tablet, Oral, Q4H PRN, Gawne, Meghan M, PA-C, 1 tablet at 11/15/20 2137   menthol-cetylpyridinium (CEPACOL) lozenge 3 mg, 1 lozenge, Oral, PRN **OR** phenol (CHLORASEPTIC) mouth spray 1 spray, 1 spray, Mouth/Throat, PRN, Gawne, Meghan M, PA-C   methocarbamol (ROBAXIN) tablet 500 mg, 500 mg, Oral, Q6H PRN **OR** methocarbamol (ROBAXIN) 500 mg in dextrose 5 % 50 mL IVPB, 500 mg, Intravenous, Q6H PRN, Gawne, Meghan M, PA-C   metoCLOPramide (REGLAN) tablet 5-10 mg, 5-10 mg, Oral, Q8H PRN **OR** metoCLOPramide (REGLAN) injection 5-10 mg, 5-10 mg, Intravenous, Q8H PRN, Gawne, Meghan M, PA-C   morphine 2 MG/ML injection 0.5-1 mg, 0.5-1 mg, Intravenous, Q2H PRN, Gawne, Meghan M, PA-C   multivitamin with minerals tablet 1 tablet, 1 tablet, Oral, Daily, Arrien, Jimmy Picket, MD, 1 tablet at 11/17/20 0904   ondansetron (ZOFRAN) tablet 4 mg, 4 mg, Oral, Q6H PRN **OR** ondansetron (ZOFRAN) injection 4 mg, 4 mg, Intravenous, Q6H PRN, Gawne, Meghan M, PA-C   pantoprazole (PROTONIX) EC tablet 40 mg, 40 mg, Oral, BID, Gawne, Meghan M, PA-C, 40 mg at 11/17/20 0905   polyethylene glycol (MIRALAX / GLYCOLAX) packet 17 g, 17 g, Oral, Daily PRN, Aggie Moats M, PA-C, 17 g at 11/17/20 1232   Patients Current Diet:  Diet Order  Diet Heart Room service appropriate? Yes; Fluid consistency: Thin  Diet effective now                         Precautions / Restrictions Precautions Precautions: Posterior Hip, Fall Precaution Booklet Issued: No Precaution Comments: verbally reviewed Restrictions Weight Bearing Restrictions: Yes RLE Weight Bearing: Weight bearing as  tolerated Other Position/Activity Restrictions: posterior hip precautions R LE    Has the patient had 2 or more falls or a fall with injury in the past year? Yes   Prior Activity Level Community (5-7x/wk): Pt. was active in the community PTAPt. Was active in the community PTA   Prior Functional Level Self Care: Did the patient need help bathing, dressing, using the toilet or eating? Independent   Indoor Mobility: Did the patient need assistance with walking from room to room (with or without device)? Independent   Stairs: Did the patient need assistance with internal or external stairs (with or without device)? Independent   Functional Cognition: Did the patient need help planning regular tasks such as shopping or remembering to take medications? Independent   Patient Information Are you of Hispanic, Latino/a,or Spanish origin?: A. No, not of Hispanic, Latino/a, or Spanish origin What is your race?: A. White Do you need or want an interpreter to communicate with a doctor or health care staff?: 0. No   Patient's Response To:  Health Literacy and Transportation Is the patient able to respond to health literacy and transportation needs?: Yes Health Literacy - How often do you need to have someone help you when you read instructions, pamphlets, or other written material from your doctor or pharmacy?: Never In the past 12 months, has lack of transportation kept you from medical appointments or from getting medications?: No In the past 12 months, has lack of transportation kept you from meetings, work, or from getting things needed for daily living?: No   Home Assistive Devices / [place] Devices/Equipment: Radio producer (specify quad or straight) Home Equipment: Environmental consultant - 2 wheels, Cane - single point, Grab bars - tub/shower   Prior Device Use: Indicate devices/aids used by the patient prior to current illness, exacerbation or injury? Walker   Current Functional Level Cognition    Overall Cognitive Status: Impaired/Different from baseline Current Attention Level: Selective Orientation Level: Oriented X4 Following Commands: Follows one step commands with increased time Safety/Judgement: Decreased awareness of deficits General Comments: pt basically asked PT to move him to EOB as he could not marshall the effort and motor planning to do the task    Extremity Assessment (includes Sensation/Coordination)   Upper Extremity Assessment: Defer to OT evaluation  Lower Extremity Assessment: Generalized weakness     ADLs   Overall ADL's : Needs assistance/impaired Eating/Feeding: Independent, Sitting Grooming: Set up, Sitting Upper Body Bathing: Set up, Sitting Lower Body Bathing: Maximal assistance, +2 for safety/equipment, +2 for physical assistance, Sit to/from stand Upper Body Dressing : Set up, Sitting General ADL Comments: Pt currently limited by pain, generalized weakness and problem solving     Mobility   Overal bed mobility: Needs Assistance Bed Mobility: Supine to Sit, Sit to Supine Supine to sit: Max assist Sit to supine: Max assist, +2 for physical assistance, +2 for safety/equipment General bed mobility comments: pt was assisted to side of bed in brace and elected to leave it on to support standing     Transfers   Overall transfer level: Needs assistance Equipment used: Rolling walker (2 wheeled)  Transfers: Sit to/from Stand Sit to Stand: Max assist Stand pivot transfers: Max assist, +2 safety/equipment, +2 physical assistance General transfer comment: stood in the brace to see if it offered support and pt did do a better job controlling the RLE.  Able to side step on the bed wth one person     Ambulation / Gait / Stairs / Wheelchair Mobility   Ambulation/Gait Ambulation/Gait assistance: Max assist, +2 safety/equipment Gait Distance (Feet): 5 Feet Assistive device: Rolling walker (2 wheeled) Gait Pattern/deviations: Step-to pattern, Decreased step  length - right, Decreased step length - left, Shuffle, Trunk flexed, Narrow base of support, Leaning posteriorly General Gait Details: maxA to maintain balance due to heavy posterior lean, but able to initiate gait training today- gait pattern very parkinsonian with occasional freezes and slow processing/limited movement initiation but improved with simple step by step cues. Fatigues very easily and started dragging L LE due to difficult weight shifting onto RLE secondary to fatigue after gait training a couple feet. close chair follow by second person for safety. Gait velocity: decreased.     Posture / Balance Dynamic Sitting Balance Sitting balance - Comments: posterior lean Balance Overall balance assessment: Needs assistance Sitting-balance support: Feet supported Sitting balance-Leahy Scale: Poor Sitting balance - Comments: posterior lean Postural control: Posterior lean Standing balance support: Bilateral upper extremity supported, During functional activity Standing balance-Leahy Scale: Poor Standing balance comment: BLE reliant on external support, posterior lean     Special needs/care consideration Oxygen 02 at 2L and Skin Right hip post op incision with dressing    Previous Home Environment (from acute therapy documentation) Living Arrangements: Spouse/significant other, Children (has 3 sons that are local)  Lives With: Significant other, Son Available Help at Discharge: Family, Available PRN/intermittently Type of Home: House Home Layout: Able to live on main level with bedroom/bathroom, Two level Alternate Level Stairs-Number of Steps: flight Home Access: Stairs to enter Entrance Stairs-Number of Steps: 1 step in the back Bathroom Shower/Tub: Chiropodist:  (unclear) Home Care Services: No Additional Comments: has lots of DME but does not use it ; has had 10-11 falls recently   Discharge Living Setting Plans for Discharge Living Setting: Patient's  home Type of Home at Discharge: House Discharge Home Layout: Two level, Able to live on main level with bedroom/bathroom Alternate Level Stairs-Rails: Right Alternate Level Stairs-Number of Steps: full flight Discharge Home Access: Level entry Discharge Bathroom Shower/Tub: Tub/shower unit Discharge Bathroom Toilet: Standard Discharge Bathroom Accessibility: Yes How Accessible: Accessible via walker Does the patient have any problems obtaining your medications?: No   Social/Family/Support Systems Patient Roles: Spouse Contact Information: [phone] Anticipated Caregiver: Payne Garske Anticipated Caregiver's Contact Information: [phone] Ability/Limitations of Caregiver: Wife can provide supervision.  Two sons Jeani Hawking and [name] are in and out all day and can assist.  They work 2 blocks away. Caregiver Availability: 24/7 Discharge Plan Discussed with Primary Caregiver: Yes Is Caregiver In Agreement with Plan?: Yes Does Caregiver/Family have Issues with Lodging/Transportation while Pt is in Rehab?: No   Goals Patient/Family Goal for Rehab: PT/OT Min A Expected length of stay: 16-18 days Pt/Family Agrees to Admission and willing to participate: Yes Program Orientation Provided & Reviewed with Pt/Caregiver Including Roles  & Responsibilities: Yes   Decrease burden of Care through IP rehab admission: Specialzed equipment needs, Decrease number of caregivers, Bowel and bladder program, and Patient/family education   Possible need for SNF placement upon discharge: not anticipated   Patient Condition: I have reviewed medical records  from [facility] , spoken with CM, and patient. I met with patient at the bedside for inpatient rehabilitation assessment.  Patient will benefit from ongoing PT and OT, can actively participate in 3 hours of therapy a day 5 days of the week, and can make measurable gains during the admission.  Patient will also benefit from the coordinated team  approach during an Inpatient Acute Rehabilitation admission.  The patient will receive intensive therapy as well as Rehabilitation physician, nursing, social worker, and care management interventions.  Due to bladder management, bowel management, safety, skin/wound care, disease management, medication administration, and patient education the patient requires 24 hour a day rehabilitation nursing.  The patient is currently Max assist +2 +2 with mobility and basic ADLs.  Discharge setting and therapy post discharge at home with home health is anticipated.  Patient has agreed to participate in the Acute Inpatient Rehabilitation Program and will admit today.   Preadmission Screen Completed By:  Retta Diones, 11/17/2020 1:11 PM ______________________________________________________________________   Discussed status with Dr. Dagoberto Ligas on 11/17/20 at 1000 and received approval for admission today.   Admission Coordinator:  Retta Diones, RN, time 1311/Date 11/17/20    Assessment/Plan: Diagnosis: Does the need for close, 24 hr/day Medical supervision in concert with the patient's rehab needs make it unreasonable for this patient to be served in a less intensive setting? Yes Co-Morbidities requiring supervision/potential complications: R hip fx s/p hemiarthroplasty- CKD3B, CAD, GERD, prostate CA, WBAT; Hb dropping/anemia- post op Due to bladder management, bowel management, safety, skin/wound care, disease management, medication administration, pain management, and patient education, does the patient require 24 hr/day rehab nursing? Yes Does the patient require coordinated care of a physician, rehab nurse, PT, OT, and SLP to address physical and functional deficits in the context of the above medical diagnosis(es)? Yes Addressing deficits in the following areas: balance, endurance, locomotion, strength, transferring, bowel/bladder control, bathing, dressing, feeding, grooming, and toileting Can the patient  actively participate in an intensive therapy program of at least 3 hrs of therapy 5 days a week? Yes The potential for patient to make measurable gains while on inpatient rehab is good and fair Anticipated functional outcomes upon discharge from inpatient rehab: min assist PT, min assist OT, n/a SLP Estimated rehab length of stay to reach the above functional goals is: 16-18 days Anticipated discharge destination: Home 10. Overall Rehab/Functional Prognosis: good and fair     MD Signature:           Revision History                                              Note Details  Jan Fireman, MD File Time 11/17/2020  1:18 PM  Author Type Physician Status Signed  Last Editor Courtney Heys, [place] # [id number] Admit Date 11/17/2020

## 2020-11-17 NOTE — Progress Notes (Signed)
IP rehab admissions - received clearance from attending MD for acute inpatient rehab admission for today.  Bed available and will admit to CIR today.  Call me for questions.  [phone]

## 2020-11-17 NOTE — Progress Notes (Signed)
Report given to [place] at [phone]. All questions and concerns were fully addressed.

## 2020-11-17 NOTE — Progress Notes (Signed)
INPATIENT REHABILITATION ADMISSION NOTE   Arrival Method: bed, transferred from 5N     Mental Orientation: Alert and oriented x4   Assessment: see flowsheet. Clear/green mucus noted. Productive cough   Skin: see flowsheet.    IV'S: LFA (saline locked)   Pain: 0/10   Tubes and Drains: N/A   Safety Measures: Bed low, locked,bed alarm on, call bell in reach   Vital Signs: WNL   Height and Weight: see flowsheet   Rehab Orientation: done   Family: not at bedside at this time

## 2020-11-17 NOTE — Care Management Important Message (Signed)
Important Message  Patient Details  Name: Jon Reid MRN: [id number] Date of Birth: 1929/01/31   Medicare Important Message Given:  Yes     Corey Caulfield 11/17/2020, 9:04 AM

## 2020-11-17 NOTE — H&P (Signed)
Physical Medicine and Rehabilitation Admission H&P        Chief Complaint  Patient presents with   Functional deficits due to fall with hip fracture  : HPI: Jon Reid is a 87  year old male with history of prostate CA, CKD, HTN, CAD, Parkinson's disease, spinal stenosis who was admitted on 11/11/20 after a fall onto right hip with subsequent right subcapital hip fracture.  He was also found to be SARS-CoV-2 positive without symptoms and felt to be older infection therefore isolation not required per ID.  He underwent right hip hemiarthroplasty the same day by Dr. Percell Miller and postop is WBAT.  He has had issues with confusion as well as lethargy, acute blood loss anemia, shortness of breath with chest pain felt to be due to GERD as well as reactive leukocytosis without signs of infection.  He did report question of coughing with aspiration and chest x-ray done on 08/30 which showed minimal left basilar atelectasis and stable large hiatal hernia.  WBC trending down and acute blood loss anemia stable.  Therapy is ongoing and patient continues to be limited by confusion with mumbling speech at times, shuffling gait with intermittent freezing episodes, posterior lean, inability to maintain hip precautions as well as poor awareness of deficits.  CIR was recommended due to functional decline.   Pt reports needs to have a BM; also has had a "cough" for a few days since choked on a piece of food- makes him a little scared to eat.  Voiding fine with condom catheter.        Review of Systems  Constitutional:  Negative for fever.  HENT:  Positive for hearing loss.   Eyes: Negative.   Respiratory:  Positive for cough and sputum production. Negative for shortness of breath.   Cardiovascular:  Positive for leg swelling. Negative for chest pain.  Gastrointestinal:  Positive for constipation. Negative for nausea and vomiting.  Genitourinary:  Negative for dysuria and hematuria.  Musculoskeletal:   Positive for falls. Negative for joint pain.  Skin: Negative.   Neurological:  Positive for tremors. Negative for dizziness and headaches.  Endo/Heme/Allergies: Negative.   Psychiatric/Behavioral:  Negative for depression. The patient is not nervous/anxious and does not have insomnia.   All other systems reviewed and are negative.         Past Medical History:  Diagnosis Date   Arthritis     Carcinoma of prostate ([place])      prostate   CKD (chronic kidney disease), stage III (HCC)     Coronary artery disease      a. 10/2012 Cath: LM 20-30d, lAD 80p, 18m 80-90d, LCX 90-939mOM1 90, RCA 50p, 2029m0d, RPDA 90, EF nl; b. 10/2012 CABG x 4 (LIMA-LAD, SVG-OM1, SVG-PDA->RPL); c. 07/2015 MV: Basal and proximal septal infarct w/o ischemia. EF 57%-->low risk.   First degree AV block 01/28/2019    Noted on EKG   GERD (gastroesophageal reflux disease)     Gout     HTN (hypertension)     Macrocytosis without anemia 01/08/2012   Moderate aortic stenosis      a. 10/2012 Echo: EF 55-60%, no rwma, BAE, mild AI, mild TR; b. 06/2020 Echo: EF 50-55%, no rwma, mild LVH. Mod dil LA. Mild MR. Mod AS.   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   Sleep apnea  Questionable   Spinal stenosis     Stroke [facility])               Past Surgical History:  Procedure Laterality Date   BALLOON DILATION N/A 10/03/2020    Procedure: BALLOON DILATION;  Surgeon: [name], DO;  [facility];  Service: Endoscopy;  Laterality: N/A;   CARDIAC CATHETERIZATION   11/07/2012    Dr Acie Fredrickson   CATARACT EXTRACTION W/ INTRAOCULAR LENS IMPLANT Bilateral     CORONARY ARTERY BYPASS GRAFT N/A 11/11/2012    Procedure: CORONARY ARTERY BYPASS GRAFTING times four using Right Greater Saphenous Vein Graft harvested endoscopically and Left Internal Mammary Artery.;  Surgeon: Ivin Poot, MD;  Location: [place];  Service: Open Heart Surgery;   Laterality: N/A;   ESOPHAGEAL BRUSHING   10/03/2020    Procedure: ESOPHAGEAL BRUSHING;  Surgeon: [name], DO;  [facility];  Service: Endoscopy;;   ESOPHAGOGASTRODUODENOSCOPY (EGD) WITH PROPOFOL N/A 06/27/2020    Surgeon: [name], DO;  large hiatal hernia, LA grade D esophagitis with no bleeding, normal examined duodenum.    ESOPHAGOGASTRODUODENOSCOPY (EGD) WITH PROPOFOL N/A 10/03/2020    Procedure: ESOPHAGOGASTRODUODENOSCOPY (EGD) WITH PROPOFOL;  Surgeon: [name], DO;  [facility];  Service: Endoscopy;  Laterality: N/A;  2:00pm   HIP ARTHROPLASTY Right 11/13/2020    Procedure: ARTHROPLASTY BIPOLAR HIP (HEMIARTHROPLASTY);  Surgeon: Renette Butters, MD;  Location: [place];  Service: Orthopedics;  Laterality: Right;   INGUINAL HERNIA REPAIR Right 01/10/2017    Procedure: OPEN RIGHT HERNIA REPAIR INGUINAL;  Surgeon: Ileana Roup, MD;  Location: [facility];  Service: General;  Laterality: Right;   INSERTION OF MESH Right 01/10/2017    Procedure: INSERTION OF MESH;  Surgeon: Ileana Roup, MD;  Location: [facility];  Service: General;  Laterality: Right;   INTRAOPERATIVE TRANSESOPHAGEAL ECHOCARDIOGRAM N/A 11/11/2012    Procedure: INTRAOPERATIVE TRANSESOPHAGEAL ECHOCARDIOGRAM;  Surgeon: Ivin Poot, MD;  Location: [place];  Service: Open Heart Surgery;  Laterality: N/A;   LEFT HEART CATHETERIZATION WITH CORONARY ANGIOGRAM N/A 11/07/2012    Procedure: LEFT HEART CATHETERIZATION WITH CORONARY ANGIOGRAM;  Surgeon: Thayer Headings, MD;  Location: [facility];  Service: Cardiovascular;  Laterality: N/A;   LUMBAR LAMINECTOMY/DECOMPRESSION MICRODISCECTOMY N/A 02/05/2019    Procedure: LUMBAR LAMINECTOMY/DECOMPRESSION L3-L4;  Surgeon: Latanya Maudlin, MD;  Location: [facility];  Service: Orthopedics;  Laterality: N/A;  27mn   LYMPH NODE DISSECTION        Bilateral pelvic   RETROPUBIC PROSTATECTOMY        Radical           Family History  Problem  Relation Age of Onset   Aneurysm Father          Cerebral   Stroke Father     Hypertension Father     Cancer Mother          colon   Parkinson's disease Son        Social History: Married. Independent PTA without AD. 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 He reports that he has never smoked. He has never used smokeless tobacco. He reports current alcohol use. He reports that he does not use drugs.          Allergies  Allergen Reactions   Crestor [Rosuvastatin] Swelling      GYNECOMASTIA             Medications Prior to  Admission  Medication Sig Dispense Refill   ascorbic acid (VITAMIN C) 500 MG tablet Take 1 tablet (500 mg total) by mouth daily. 30 tablet 1   aspirin EC 81 MG tablet Take 81 mg by mouth daily.       carbidopa-levodopa (SINEMET IR) 25-100 MG tablet Take 1.5 tablets by mouth 3 (three) times daily. 405 tablet 3   cephALEXin (KEFLEX) 500 MG capsule Take 1 capsule (500 mg total) by mouth 2 (two) times daily. 14 capsule 0   fluticasone (FLONASE) 50 MCG/ACT nasal spray One to 2 sprays each nostril at bedtime (Patient taking differently: Place 2 sprays into both nostrils at bedtime.) 16 g 6   furosemide (LASIX) 40 MG tablet TAKE 1 TABLET DAILY (Patient taking differently: Take 40 mg by mouth daily.) 90 tablet 0   Iron, Ferrous Sulfate, 325 (65 Fe) MG TABS Take 1 tablet by mouth daily. (Patient taking differently: Take 325 mg by mouth daily.) 30 tablet 3   nitroGLYCERIN (NITROSTAT) 0.4 MG SL tablet Place 1 tablet (0.4 mg total) under the tongue every 5 (five) minutes as needed for chest pain. 25 tablet PRN   pantoprazole (PROTONIX) 40 MG tablet Take 1 tablet (40 mg total) by mouth 2 (two) times daily. TAKE 1 TABLET ONCE DAILY FOR REFLUX (Patient taking differently: Take 40 mg by mouth 2 (two) times daily.) 60 tablet 2   triamcinolone cream (KENALOG) 0.1 % Apply 1 application topically daily as needed (itching).       zinc sulfate 220 (50 Zn) MG  capsule TAKE ONE CAPSULE ONCE DAILY (Patient taking differently: Take 220 mg by mouth daily.) 30 capsule 1   Cholecalciferol (VITAMIN D) 2000 units CAPS Take 2,000 Units by mouth daily. (Patient not taking: No sig reported)          Drug Regimen Review  Drug regimen was reviewed and remains appropriate with no significant issues identified   Home: Home Living Family/patient expects to be discharged to:: Private residence Living Arrangements: Spouse/significant other, Children (has 3 sons that are local) Available Help at Discharge: Family, Available PRN/intermittently Type of Home: House Home Access: Stairs to enter [employer] of Steps: 1 step in the back Home Layout: Able to live on main level with bedroom/bathroom, Two level Alternate Level Stairs-Number of Steps: flight Bathroom Shower/Tub: Chiropodist:  (unclear) Home Equipment: Environmental consultant - 2 wheels, Cane - single point, Grab bars - tub/shower Additional Comments: has lots of DME but does not use it ; has had 10-11 falls recently  Lives With: Significant other, Son   Functional History: Prior Function Level of Independence: Independent with assistive device(s) Comments: Pt seems to be a poor historian with an altered timeline of events. However he states that he was walking "laps" in [employer], indep in all ADL/IADLs PTA. Then states he has been sick, lost 35lbs and has had multiple falls.   Functional Status:  Mobility: Bed Mobility Overal bed mobility: Needs Assistance Bed Mobility: Supine to Sit, Sit to Supine Supine to sit: Max assist Sit to supine: Max assist, +2 for physical assistance, +2 for safety/equipment General bed mobility comments: pt was assisted to side of bed in brace and elected to leave it on to support standing Transfers Overall transfer level: Needs assistance Equipment used: Rolling walker (2 wheeled) Transfers: Sit to/from Stand Sit to Stand: Max assist Stand pivot  transfers: Max assist, +2 safety/equipment, +2 physical assistance General transfer comment: stood in the brace to see if it offered support  and pt did do a better job controlling the RLE.  Able to side step on the bed wth one person Ambulation/Gait Ambulation/Gait assistance: Mod assist Gait Distance (Feet): 3 Feet Assistive device: Rolling walker (2 wheeled) Gait Pattern/deviations: Step-to pattern, Decreased stride length, Decreased weight shift to right, Shuffle, Wide base of support General Gait Details: sidestepped with encouragement, did not leave in chair due to CNA arriving to prep him for CIR Gait velocity: decreased.   ADL: ADL Overall ADL's : Needs assistance/impaired Eating/Feeding: Independent, Sitting Grooming: Set up, Sitting Upper Body Bathing: Set up, Sitting Lower Body Bathing: Maximal assistance, +2 for safety/equipment, +2 for physical assistance, Sit to/from stand Upper Body Dressing : Set up, Sitting General ADL Comments: Pt currently limited by pain, generalized weakness and problem solving   Cognition: Cognition Overall Cognitive Status: Impaired/Different from baseline Orientation Level: Oriented X4 Cognition Arousal/Alertness: Awake/alert Behavior During Therapy: Flat affect Overall Cognitive Status: Impaired/Different from baseline Area of Impairment: Problem solving, Awareness, Safety/judgement, Following commands, Memory, Attention, Orientation Orientation Level: Situation, Time Current Attention Level: Selective Memory: Decreased recall of precautions Following Commands: Follows one step commands with increased time Safety/Judgement: Decreased awareness of deficits Awareness: Intellectual Problem Solving: Slow processing, Requires verbal cues, Requires tactile cues General Comments: pt basically asked PT to move him to EOB as he could not marshall the effort and motor planning to do the task     Blood pressure (!) 102/49, pulse 76, temperature  98.4 F (36.9 C), temperature source Oral, resp. rate 18, height 6' (1.829 m), weight 68.8 kg, SpO2 99 %. Physical Exam Vitals and nursing note reviewed.  Constitutional:      Appearance: Normal appearance.     Comments: Frail elderly male. Sitting up in bed; appropriate ; occ resting tremor seen, NAD; HOH  HENT:     Head: Normocephalic and atraumatic.     Right Ear: External ear normal.     Left Ear: External ear normal.     Nose: Nose normal. No congestion.     Mouth/Throat:     Mouth: Mucous membranes are dry.     Pharynx: Oropharynx is clear. No oropharyngeal exudate.  Eyes:     General:        Right eye: No discharge.        Left eye: No discharge.     Extraocular Movements: Extraocular movements intact.  Cardiovascular:     Rate and Rhythm: Normal rate and regular rhythm.     Heart sounds: Normal heart sounds. No murmur heard.   No gallop.  Pulmonary:     Comments: Occasional congested cough noted. Decreased BS at bases. Also has a productive cough of frothy white sputum- a few rhonchi- cleared, but constantly clearing throat.  Abdominal:     Comments: Slightly distended; soft, hypoactive bs; NT  Genitourinary:    Comments: Condom catheter- medium amber urine in bag Musculoskeletal:     Cervical back: Normal range of motion. No rigidity.     Comments: Right hip incision covered with steri-strips.  Moderate edema, no erythema seen.  UE 5-/5 B/L RLE- HF 2-/5- in brace from mid thigh to R ankle- cannot test- knee; DF/PF 4-/5 LLE- 5-/5  Skin:    Comments: Bilateral forearms with multiple ecchymotic areas.  L forearm iv- looks ok- but pulling at skin Peeling skin on top of head with actinic keratosis.   Neurological:     Mental Status: He is alert and oriented to person, place, and time.  Comments: Decreased hearing. Speech clear. Able to follow simple commands without difficulty. Able to state age but had difficulty recalling month and day "Wed".  Did remember had an  EGD lately for blood Intact to light touch x 4  Psychiatric:     Comments: Interactive; HOH      Lab Results Last 48 Hours        Results for orders placed or performed during the hospital encounter of 11/11/20 (from the past 48 hour(s))  CBC     Status: Abnormal    Collection Time: 11/16/20 12:55 AM  Result Value Ref Range    WBC 10.1 4.0 - 10.5 K/uL    RBC 2.57 (L) 4.22 - 5.81 MIL/uL    Hemoglobin 8.1 (L) 13.0 - 17.0 g/dL    HCT 25.3 (L) 39.0 - 52.0 %    MCV 98.4 80.0 - 100.0 fL    MCH 31.5 26.0 - 34.0 pg    MCHC 32.0 30.0 - 36.0 g/dL    RDW 16.1 (H) 11.5 - 15.5 %    Platelets 222 150 - 400 K/uL    nRBC 0.0 0.0 - 0.2 %      Comment: Performed at [facility], [address], [place], [place] [address]  CBC     Status: Abnormal    Collection Time: 11/17/20  1:16 AM  Result Value Ref Range    WBC 10.4 4.0 - 10.5 K/uL    RBC 2.63 (L) 4.22 - 5.81 MIL/uL    Hemoglobin 8.2 (L) 13.0 - 17.0 g/dL    HCT 25.7 (L) 39.0 - 52.0 %    MCV 97.7 80.0 - 100.0 fL    MCH 31.2 26.0 - 34.0 pg    MCHC 31.9 30.0 - 36.0 g/dL    RDW 15.9 (H) 11.5 - 15.5 %    Platelets 224 150 - 400 K/uL    nRBC 0.0 0.0 - 0.2 %      Comment: Performed at [facility], [place] [address], [place], [place] [address]      Imaging Results (Last 48 hours)  No results found.           Medical Problem List and Plan: 1.  R hip fx s/p hemiarthroplasty secondary to multiple falls             -patient may  shower if covered      -ELOS/Goals: 16-18 days- min Assist 2.  Antithrombotics: -DVT/anticoagulation:  Pharmaceutical: Lovenox             -antiplatelet therapy: On ASA bid for DVT prophylaxis?-->decrease to once a day as on lovenox.  3. Pain Management: Hydrocodone (limit narcotics) or tylenol prn.  4. Mood: LCSW to follow for evaluation and  support.              -antipsychotic agents: N/A 5. Neuropsych: This patient is not fully capable of making decisions on his own behalf. 6. Skin/Wound Care:  Monitor wound for healing.  7. Fluids/Electrolytes/Nutrition: Monitor I/O. Check lytes in am. 8. Cough: New due to question aspiration event--Has been on Mucinex since 08/30-->triggered Covid precautions by nurse             --ID contacted for input/clarity             --will add supervision with meals and reflux precautions (large [place]) 9. GERD: Continue Protonix BID 10. Parkinson's disease: Managed with Sinemet IR TID 11. CAD s/p CABG: Monitor for symptoms with increase  in activity. Continue ASA.              --hold lasix due to intermittent low BP.  12. Acute on chronic anemia due to blood loss anemia: Recheck CBC in am.              --resume iron supplement.  13. Constipation: Will add Senna S.            Bary Leriche, PA-C 11/17/2020    I have personally performed a face to face diagnostic evaluation of this patient and formulated the key components of the plan.  Additionally, I have personally reviewed laboratory data, imaging studies, as well as relevant notes and concur with the physician assistant's documentation above.   The patient's status has not changed from the original H&P.  Any changes in documentation from the acute care chart have been noted above.

## 2020-11-17 NOTE — Discharge Summary (Signed)
Physician Discharge Summary  Jon Reid [id number] DOB: Jan 04, 1929 DOA: 11/11/2020  PCP: Claretta Fraise, MD  Admit date: 11/11/2020 Discharge date: 11/17/2020  Admitted From: Home Disposition: Inpatient rehabilitation  Recommendations for Outpatient Follow-up:  Follow up with PCP in 1 week Please follow up on the following pending results: None   Discharge Condition: Stable CODE STATUS: Full code Diet recommendation: Regular diet   Brief/Interim Summary:  Admission HPI written by Truett Mainland, DO   HPI: Jon Reid is a 87 y.o. male with a history of stage III chronic kidney disease, coronary artery disease with history of first-degree block, GERD, hypertension, history of prostate cancer.  Patient presents after a fall just prior to presentation.  Patient landed on his right hip and had immediate pain.  Pain increased with movement and improved with rest.  No other palliating or provoking factors.  He was brought to the emergency department for evaluation.  His pain is stable.  He denies chest pain, shortness of breath, wheezing, abdominal pain.  He did not hit his head  Hospital course:  Right hip fracture Secondary to fall. X-ray significant for subcapital right hip fracture. Orthopedic surgery consulted and performed right hemiarthroplasty on 8/28. Recommendations for WBAT of RLE, keep dressing intact until outpatient follow-up, aspirin 81 mg BID x30 days on discharge for VTE prophylaxis   Sars-CoV-2 positive No concern for active infection. Patient not requiring isolation per discussion with ID.   CKD stage IIIb Stable.   Anemia of chronic disease Slightly worsened hemoglobin. No obvious evidence of hematoma or hemorrhage. Hemoglobin stabilized, still with no evidence of post-operative bleeding.   Primary hypertension Not on medications. Stable.   Parkinson disease Patient is on Sinemet as an outpatient. Continue Sinemet TID   History of CAD History  of CVA Continue aspirin. Per orthopedic surgery, patient will be on aspirin BID for 30 days followed by resumption of daily aspinrin   Reactive leukocytosis Secondary to fracture. Resolved.   Cough Chest x-ray without evidence pneumonia. No leukocytosis, fevers or focal lung sounds. On room air. Recommend continued symptomatic care, but low threshold to be suspicious for developing pneumonia if continues. Does not appear to be related to oral intake of food.  Discharge Diagnoses:  Active Problems:   Benign essential hypertension   Peripheral vascular disease (HCC)   Chronic renal insufficiency, stage III (moderate) (HCC)   Coronary artery disease involving native coronary artery of native heart without angina pectoris   Gastroesophageal reflux disease with esophagitis   Goals of care, counseling/discussion   Femur fracture, right (HCC)   Closed right hip fracture, initial encounter ([place])   S/P hip hemiarthroplasty   Malnutrition of moderate degree    Discharge Instructions   Allergies as of 11/17/2020       Reactions   Crestor [rosuvastatin] Swelling   GYNECOMASTIA         Medication List     STOP taking these medications    cephALEXin 500 MG capsule Commonly known as: Keflex       TAKE these medications    ascorbic acid 500 MG tablet Commonly known as: VITAMIN C Take 1 tablet (500 mg total) by mouth daily.   aspirin 81 MG EC tablet Take 1 tablet (81 mg total) by mouth 2 (two) times daily for 30 days, THEN 1 tablet (81 mg total) daily. Swallow whole.. Start taking on: November 17, 2020 What changed: See the new instructions.   carbidopa-levodopa 25-100 MG tablet Commonly  known as: SINEMET IR Take 1.5 tablets by mouth 3 (three) times daily.   dextromethorphan-guaiFENesin 30-600 MG 12hr tablet Commonly known as: MUCINEX DM Take 1 tablet by mouth 2 (two) times daily.   feeding supplement Liqd Take 237 mLs by mouth 3 (three) times daily between meals.    fluticasone 50 MCG/ACT nasal spray Commonly known as: FLONASE One to 2 sprays each nostril at bedtime What changed:  how much to take how to take this when to take this additional instructions   furosemide 40 MG tablet Commonly known as: LASIX TAKE 1 TABLET DAILY   Iron (Ferrous Sulfate) 325 (65 Fe) MG Tabs Take 1 tablet by mouth daily. What changed: how much to take   nitroGLYCERIN 0.4 MG SL tablet Commonly known as: NITROSTAT Place 1 tablet (0.4 mg total) under the tongue every 5 (five) minutes as needed for chest pain.   pantoprazole 40 MG tablet Commonly known as: PROTONIX Take 1 tablet (40 mg total) by mouth 2 (two) times daily. TAKE 1 TABLET ONCE DAILY FOR REFLUX What changed: additional instructions   triamcinolone cream 0.1 % Commonly known as: KENALOG Apply 1 application topically daily as needed (itching).   Vitamin D 50 MCG (2000 UT) Caps Take 2,000 Units by mouth daily.   zinc sulfate 220 (50 Zn) MG capsule TAKE ONE CAPSULE ONCE DAILY        Follow-up Information     Renette Butters, MD. Schedule an appointment as soon as possible for a visit in 2 week(s).   Specialty: Orthopedic Surgery Why: after discharge Contact information: [address] [place] [address] [phone]                Allergies  Allergen Reactions   Crestor [Rosuvastatin] Swelling    GYNECOMASTIA     Consultations: Orthopedic surgery   Procedures/Studies: CT HEAD WO CONTRAST (5MM)  Result Date: 11/11/2020 CLINICAL DATA:  Fall. EXAM: CT HEAD WITHOUT CONTRAST TECHNIQUE: Contiguous axial images were obtained from the base of the skull through the vertex without intravenous contrast. COMPARISON:  MRI brain dated November 02, 2019. FINDINGS: Brain: No evidence of acute infarction, hemorrhage, hydrocephalus, extra-axial collection or mass lesion/mass effect. Stable moderate atrophy and mild chronic microvascular ischemic changes. Vascular:  Atherosclerotic vascular calcification of the carotid siphons. No hyperdense vessel. Skull: Normal. Negative for fracture or focal lesion. Sinuses/Orbits: No acute finding. Other: None. IMPRESSION: 1. No acute intracranial abnormality. 2. Stable moderate atrophy and mild chronic microvascular ischemic changes. Electronically Signed   By: Titus Dubin M.D.   On: 11/11/2020 19:47   Pelvis Portable  Result Date: 11/13/2020 CLINICAL DATA:  Status post right hip placement. EXAM: PORTABLE PELVIS 1-2 VIEWS COMPARISON:  None. FINDINGS: The patient is status post right hip replacement. Hardware is in good position. Postoperative air seen in the adjacent soft tissues. IMPRESSION: Right hip replacement as above. Electronically Signed   By: Dorise Bullion III M.D.   On: 11/13/2020 13:15   DG CHEST PORT 1 VIEW  Result Date: 11/15/2020 CLINICAL DATA:  Productive cough. EXAM: PORTABLE CHEST 1 VIEW COMPARISON:  July 14, 2020. FINDINGS: Stable cardiomediastinal silhouette. Status post coronary artery bypass graft. Stable large hiatal hernia. No pneumothorax is noted. Right lung is clear. Minimal left basilar subsegmental atelectasis is noted. Bony thorax is unremarkable. IMPRESSION: Stable large hiatal hernia. Minimal left basilar sub some atelectasis. Electronically Signed   By: Marijo Conception M.D.   On: 11/15/2020 14:59   DG Finger  Thumb Left  Result Date: 10/31/2020 CLINICAL DATA:  Trauma EXAM: LEFT THUMB 2+V COMPARISON:  X-ray dated December 28, 2014 FINDINGS: No acute fracture or dislocation. Moderate degenerative changes of the thumb MCP and IP joint. Soft tissue swelling of the palm. IMPRESSION: No evidence of acute fracture Electronically Signed   By: Yetta Glassman M.D.   On: 10/31/2020 14:42   DG Hip Unilat W or Wo Pelvis 2-3 Views Right  Result Date: 11/11/2020 CLINICAL DATA:  Fall, right hip pain EXAM: DG HIP (WITH OR WITHOUT PELVIS) 2-3V RIGHT COMPARISON:  None. FINDINGS: Subcapital right hip  fracture with varus angulation and foreshortening. Visualized bony pelvis appears intact. Old left inferior pubic ramus fracture. Left hip joint space is preserved. IMPRESSION: Subcapital right hip fracture, as above. Electronically Signed   By: Julian Hy M.D.   On: 11/11/2020 20:08      Subjective: No issues overnight. No dyspnea.  Discharge Exam: Vitals:   11/17/20 0741 11/17/20 1134  BP: 133/89 (!) 102/49  Pulse: 71 76  Resp: 18 18  Temp: 98.5 F (36.9 C) 98.4 F (36.9 C)  SpO2: 96% 99%   Vitals:   11/16/20 1500 11/16/20 2030 11/17/20 0741 11/17/20 1134  BP: (!) 131/56 (!) 122/55 133/89 (!) 102/49  Pulse: 77 82 71 76  Resp: '18 18 18 18  '$ Temp: 98 F (36.7 C) 98.2 F (36.8 C) 98.5 F (36.9 C) 98.4 F (36.9 C)  TempSrc: Oral Oral Oral Oral  SpO2: 99% 98% 96% 99%  Weight:      Height:        General: Pt is alert, awake, not in acute distress Cardiovascular: RRR, S1/S2 +, no rubs, no gallops Respiratory: CTA bilaterally, no wheezing, no rhonchi Abdominal: Soft, NT, ND, bowel sounds + Extremities: no edema, no cyanosis    The results of significant diagnostics from this hospitalization (including imaging, microbiology, ancillary and laboratory) are listed below for reference.     Microbiology: Recent Results (from the past 240 hour(s))  Resp Panel by RT-PCR (Flu A&B, Covid)     Status: Abnormal   Collection Time: 11/11/20  8:14 PM   Specimen: Nasopharyngeal(NP) swabs in vial transport medium  Result Value Ref Range Status   SARS Coronavirus 2 by RT PCR POSITIVE (A) NEGATIVE Final    Comment: CRITICAL RESULT CALLED TO, READ BACK BY AND VERIFIED WITH: TURNER,C 2154 11/11/2020 COLEMAN,R (NOTE) SARS-CoV-2 target nucleic acids are DETECTED.  The SARS-CoV-2 RNA is generally detectable in upper respiratory specimens during the acute phase of infection. Positive results are indicative of the presence of the identified virus, but do not rule out bacterial  infection or co-infection with other pathogens not detected by the test. Clinical correlation with patient history and other diagnostic information is necessary to determine patient infection status. The expected result is Negative.  Fact Sheet for Patients: EntrepreneurPulse.com.au  Fact Sheet for Healthcare Providers: IncredibleEmployment.be  This test is not yet approved or cleared by the [place] FDA and  has been authorized for detection and/or diagnosis of SARS-CoV-2 by FDA under an Emergency Use Authorization (EUA).  This EUA will remain in effect (meaning this t est can be used) for the duration of  the COVID-19 declaration under Section 564(b)(1) of the Act, 21 U.S.C. section 360bbb-3(b)(1), unless the authorization is terminated or revoked sooner.     Influenza A by PCR NEGATIVE NEGATIVE Final   Influenza B by PCR NEGATIVE NEGATIVE Final    Comment: (NOTE) The Xpert Xpress SARS-CoV-2/FLU/RSV  plus assay is intended as an aid in the diagnosis of influenza from Nasopharyngeal swab specimens and should not be used as a sole basis for treatment. Nasal washings and aspirates are unacceptable for Xpert Xpress SARS-CoV-2/FLU/RSV testing.  Fact Sheet for Patients: EntrepreneurPulse.com.au  Fact Sheet for Healthcare Providers: IncredibleEmployment.be  This test is not yet approved or cleared by the [place] FDA and has been authorized for detection and/or diagnosis of SARS-CoV-2 by FDA under an Emergency Use Authorization (EUA). This EUA will remain in effect (meaning this test can be used) for the duration of the COVID-19 declaration under Section 564(b)(1) of the Act, 21 U.S.C. section 360bbb-3(b)(1), unless the authorization is terminated or revoked.  Performed at [facility], [address], [place], [place] [address]   Surgical pcr screen     Status: None   Collection Time: 11/11/20  11:44 PM   Specimen: Nasal Mucosa; Nasal Swab  Result Value Ref Range Status   MRSA, PCR NEGATIVE NEGATIVE Final   Staphylococcus aureus NEGATIVE NEGATIVE Final    Comment: (NOTE) The Xpert SA Assay (FDA approved for NASAL specimens in patients 22 years of age and older), is one component of a comprehensive surveillance program. It is not intended to diagnose infection nor to guide or monitor treatment. Performed at [facility], [address] [place], [place] [address]      Labs: BNP (last 3 results) Recent Labs    07/25/20 1229 08/17/20 1648  BNP 265.0* 123XX123*   Basic Metabolic Panel: Recent Labs  Lab 11/11/20 1917 11/12/20 0416 11/13/20 0718 11/14/20 0738 11/15/20 0227  NA 138 140 138 136 136  K 3.3* 3.2* 3.7 4.0 3.6  CL 102 105 105 104 103  CO2 '25 26 26 25 25  '$ GLUCOSE 111* 106* 123* 127* 113*  BUN '19 20 14 18 17  '$ CREATININE 1.41* 1.30* 1.31* 1.35* 1.21  CALCIUM 8.8* 8.5* 8.3* 8.2* 8.5*  MG  --   --  1.8  --   --    Liver Function Tests: Recent Labs  Lab 11/11/20 1917  AST 28  ALT 15  ALKPHOS 84  BILITOT 0.7  PROT 7.3  ALBUMIN 4.1   No results for input(s): LIPASE, AMYLASE in the last 168 hours. No results for input(s): AMMONIA in the last 168 hours. CBC: Recent Labs  Lab 11/11/20 1917 11/12/20 0416 11/13/20 0718 11/14/20 0738 11/15/20 0227 11/16/20 0055 11/17/20 0116  WBC 12.4*   < > 11.6* 15.4* 11.1* 10.1 10.4  NEUTROABS 10.5*  --   --   --   --   --   --   HGB 11.1*   < > 9.3* 9.0* 9.0* 8.1* 8.2*  HCT 34.7*   < > 29.4* 28.1* 27.6* 25.3* 25.7*  MCV 99.7   < > 98.7 97.9 97.2 98.4 97.7  PLT 389   < > 298 276 261 222 224   < > = values in this interval not displayed.   Cardiac Enzymes: No results for input(s): CKTOTAL, CKMB, CKMBINDEX, TROPONINI in the last 168 hours. BNP: Invalid input(s): POCBNP CBG: No results for input(s): GLUCAP in the last 168 hours. D-Dimer No results for input(s): DDIMER in the last 72 hours. Hgb A1c No  results for input(s): HGBA1C in the last 72 hours. Lipid Profile No results for input(s): CHOL, HDL, LDLCALC, TRIG, CHOLHDL, LDLDIRECT in the last 72 hours. Thyroid function studies No results for input(s): TSH, T4TOTAL, T3FREE, THYROIDAB in the last 72 hours.  Invalid input(s): FREET3  Anemia work up No results for input(s): VITAMINB12, FOLATE, FERRITIN, TIBC, IRON, RETICCTPCT in the last 72 hours. Urinalysis    Component Value Date/Time   COLORURINE YELLOW 11/11/2020 1933   APPEARANCEUR CLEAR 11/11/2020 1933   LABSPEC 1.011 11/11/2020 1933   PHURINE 5.0 11/11/2020 1933   GLUCOSEU NEGATIVE 11/11/2020 1933   HGBUR NEGATIVE 11/11/2020 [place] NEGATIVE 11/11/2020 1933   BILIRUBINUR neg 07/07/2012 1455   KETONESUR 5 (A) 11/11/2020 1933   PROTEINUR NEGATIVE 11/11/2020 1933   UROBILINOGEN 0.2 11/08/2012 1927   NITRITE NEGATIVE 11/11/2020 1933   LEUKOCYTESUR NEGATIVE 11/11/2020 1933   Sepsis Labs Invalid input(s): PROCALCITONIN,  WBC,  LACTICIDVEN Microbiology Recent Results (from the past 240 hour(s))  Resp Panel by RT-PCR (Flu A&B, Covid)     Status: Abnormal   Collection Time: 11/11/20  8:14 PM   Specimen: Nasopharyngeal(NP) swabs in vial transport medium  Result Value Ref Range Status   SARS Coronavirus 2 by RT PCR POSITIVE (A) NEGATIVE Final    Comment: CRITICAL RESULT CALLED TO, READ BACK BY AND VERIFIED WITH: TURNER,C 2154 11/11/2020 COLEMAN,R (NOTE) SARS-CoV-2 target nucleic acids are DETECTED.  The SARS-CoV-2 RNA is generally detectable in upper respiratory specimens during the acute phase of infection. Positive results are indicative of the presence of the identified virus, but do not rule out bacterial infection or co-infection with other pathogens not detected by the test. Clinical correlation with patient history and other diagnostic information is necessary to determine patient infection status. The expected result is Negative.  Fact Sheet for  Patients: EntrepreneurPulse.com.au  Fact Sheet for Healthcare Providers: IncredibleEmployment.be  This test is not yet approved or cleared by the [place] FDA and  has been authorized for detection and/or diagnosis of SARS-CoV-2 by FDA under an Emergency Use Authorization (EUA).  This EUA will remain in effect (meaning this t est can be used) for the duration of  the COVID-19 declaration under Section 564(b)(1) of the Act, 21 U.S.C. section 360bbb-3(b)(1), unless the authorization is terminated or revoked sooner.     Influenza A by PCR NEGATIVE NEGATIVE Final   Influenza B by PCR NEGATIVE NEGATIVE Final    Comment: (NOTE) The Xpert Xpress SARS-CoV-2/FLU/RSV plus assay is intended as an aid in the diagnosis of influenza from Nasopharyngeal swab specimens and should not be used as a sole basis for treatment. Nasal washings and aspirates are unacceptable for Xpert Xpress SARS-CoV-2/FLU/RSV testing.  Fact Sheet for Patients: EntrepreneurPulse.com.au  Fact Sheet for Healthcare Providers: IncredibleEmployment.be  This test is not yet approved or cleared by the [place] FDA and has been authorized for detection and/or diagnosis of SARS-CoV-2 by FDA under an Emergency Use Authorization (EUA). This EUA will remain in effect (meaning this test can be used) for the duration of the COVID-19 declaration under Section 564(b)(1) of the Act, 21 U.S.C. section 360bbb-3(b)(1), unless the authorization is terminated or revoked.  Performed at [facility], [address] [place],  [address]   Surgical pcr screen     Status: None   Collection Time: 11/11/20 11:44 PM   Specimen: Nasal Mucosa; Nasal Swab  Result Value Ref Range Status   MRSA, PCR NEGATIVE NEGATIVE Final   Staphylococcus aureus NEGATIVE NEGATIVE Final    Comment: (NOTE) The Xpert SA Assay (FDA approved for NASAL specimens in patients  6 years of age and older), is one component of a comprehensive surveillance program. It is not intended to diagnose infection nor to guide or monitor treatment. Performed  at [facility], [address], [place], [place] [address]      Time coordinating discharge: 35 minutes  SIGNED:   Cordelia Poche, MD [facility] 11/17/2020, 1:26 PM

## 2020-11-18 DIAGNOSIS — S72001S Fracture of unspecified part of neck of right femur, sequela: Secondary | ICD-10-CM

## 2020-11-18 DIAGNOSIS — S72011A Unspecified intracapsular fracture of right femur, initial encounter for closed fracture: Secondary | ICD-10-CM | POA: Diagnosis not present

## 2020-11-18 MED ORDER — HYDROCODONE-ACETAMINOPHEN 5-325 MG PO TABS: 1.0000 | ORAL_TABLET | Freq: Four times a day (QID) | ORAL | Status: DC | PRN

## 2020-11-18 MED ORDER — PHENOL 1.4 % MT LIQD: 1.0000 | OROMUCOSAL | Status: DC | PRN

## 2020-11-18 MED ORDER — DOCUSATE SODIUM 100 MG PO CAPS
100.0000 mg | ORAL_CAPSULE | Freq: Two times a day (BID) | ORAL | Status: DC
Start: 2020-11-18 — End: 2020-12-08
  Administered 2020-11-18 – 2020-12-08 (×39): 100 mg via ORAL
  Filled 2020-11-18 (×41): qty 1

## 2020-11-18 MED ORDER — HYDROCODONE-ACETAMINOPHEN 5-325 MG PO TABS: 1.0000 | ORAL_TABLET | ORAL | Status: DC | PRN

## 2020-11-18 MED ORDER — MENTHOL 3 MG MT LOZG: 1.0000 | LOZENGE | OROMUCOSAL | Status: DC | PRN

## 2020-11-18 NOTE — Progress Notes (Signed)
PROGRESS NOTE   Subjective/Complaints: He has no complaints initially this morning When asked, notes phelgm production- no difficulty with breathing Oriented x3   Objective:   DG Chest Port 1V same Day  Result Date: 11/17/2020 CLINICAL DATA:  87 year old male with history of cough. EXAM: PORTABLE CHEST 1 VIEW COMPARISON:  Chest x-ray 11/15/2020. FINDINGS: Lung volumes are low. No consolidative airspace disease. No pleural effusions. No pneumothorax. No pulmonary nodule or mass noted. Very large hiatal hernia. Pulmonary vasculature and the cardiomediastinal silhouette are otherwise within normal limits. Atherosclerosis in the thoracic aorta. Status post median sternotomy for CABG. IMPRESSION: 1. Low lung volumes without radiographic evidence of acute cardiopulmonary disease. 2. Very large hiatal hernia again noted. 3. Aortic atherosclerosis. Electronically Signed   By: Vinnie Langton M.D.   On: 11/17/2020 21:18   Recent Labs    11/16/20 0055 11/17/20 0116  WBC 10.1 10.4  HGB 8.1* 8.2*  HCT 25.3* 25.7*  PLT 222 224   No results for input(s): NA, K, CL, CO2, GLUCOSE, BUN, CREATININE, CALCIUM in the last 72 hours.  Intake/Output Summary (Last 24 hours) at 11/18/2020 1235 Last data filed at 11/18/2020 1156 Gross per 24 hour  Intake 240 ml  Output 300 ml  Net -60 ml     Pressure Injury 11/17/20 Sacrum Mid Stage 2 -  Partial thickness loss of dermis presenting as a shallow open injury with a red, pink wound bed without slough. round red shallow crater surrounded by denuded area with serous drainage (Active)  11/17/20 0315  Location: Sacrum  Location Orientation: Mid  Staging: Stage 2 -  Partial thickness loss of dermis presenting as a shallow open injury with a red, pink wound bed without slough.  Wound Description (Comments): round red shallow crater surrounded by denuded area with serous drainage  Present on Admission: Yes     Physical Exam: Vital Signs Blood pressure (!) 100/55, pulse 66, temperature 99 F (37.2 C), temperature source Oral, resp. rate 18, height 6' (1.829 m), SpO2 94 %. Gen: no distress, normal appearing HEENT: oral mucosa pink and moist, NCAT Cardio: Reg rate Pulmonary:     Comments: Occasional congested cough noted. Decreased BS at bases. Also has a productive cough of frothy white sputum- a few rhonchi- cleared, but constantly clearing throat.  Abdominal:     Comments: Slightly distended; soft, hypoactive bs; NT  Genitourinary:    Comments: Condom catheter- medium amber urine in bag Musculoskeletal:     Cervical back: Normal range of motion. No rigidity.     Comments: Right hip incision covered with steri-strips.  Moderate edema, no erythema seen.  UE 5-/5 B/L RLE- HF 3/5- in brace from mid thigh to R ankle- cannot test- knee; DF/PF 4-/5 LLE- 5-/5  Skin:    Comments: Bilateral forearms with multiple ecchymotic areas.  L forearm iv- looks ok- but pulling at skin Peeling skin on top of head with actinic keratosis.   Neurological:     Mental Status: He is alert and oriented to person, place, and time.     Comments: Decreased hearing. Speech clear. Able to follow simple commands without difficulty. Able to state age but had difficulty recalling  month and day "Wed".  Did remember had an EGD lately for blood Intact to light touch x 4  Psychiatric:     Comments: Interactive; HOH    Assessment/Plan: 1. Functional deficits which require 3+ hours per day of interdisciplinary therapy in a comprehensive inpatient rehab setting. Physiatrist is providing close team supervision and 24 hour management of active medical problems listed below. Physiatrist and rehab team continue to assess barriers to discharge/monitor patient progress toward functional and medical goals  Care Tool:  Bathing              Bathing assist       Upper Body Dressing/Undressing Upper body dressing    What is the patient wearing?: Hospital gown only    Upper body assist Assist Level: Set up assist    Lower Body Dressing/Undressing Lower body dressing            Lower body assist       Toileting Toileting    Toileting assist       Transfers Chair/bed transfer  Transfers assist           Locomotion Ambulation   Ambulation assist              Walk 10 feet activity   Assist           Walk 50 feet activity   Assist           Walk 150 feet activity   Assist           Walk 10 feet on uneven surface  activity   Assist           Wheelchair     Assist               Wheelchair 50 feet with 2 turns activity    Assist            Wheelchair 150 feet activity     Assist          Blood pressure (!) 100/55, pulse 66, temperature 99 F (37.2 C), temperature source Oral, resp. rate 18, height 6' (1.829 m), SpO2 94 %.  Medical Problem List and Plan: 1.  R hip fx s/p hemiarthroplasty secondary to multiple falls             -patient may  shower if covered       -ELOS/Goals: 16-18 days- min Assist Decrease therapy to QD 2.  Antithrombotics: -DVT/anticoagulation:  Pharmaceutical: Lovenox             -antiplatelet therapy: On ASA bid for DVT prophylaxis?-->decrease to once a day as on lovenox.  3. Pain: Decrease Norco to q6H prn for severe pain, tylenol prn.  4. Mood: LCSW to follow for evaluation and  support.              -antipsychotic agents: N/A 5. Neuropsych: This patient is not fully capable of making decisions on his own behalf. 6. Skin/Wound Care: Monitor wound for healing.  7. Fluids/Electrolytes/Nutrition: Monitor I/O. Check lytes in am. 8. Cough: New due to question aspiration event--Has been on Mucinex since 08/30-->triggered Covid precautions by nurse             --ID contacted for input/clarity             --will add supervision with meals and reflux precautions (large [place])  CXR reviewed  and stable 9. GERD: Continue Protonix BID 10. Parkinson's disease: Managed with Sinemet IR TID 11.  CAD s/p CABG: Monitor for symptoms with increase in activity. Continue ASA.              --hold lasix due to intermittent low BP.  12. Acute on chronic anemia due to blood loss anemia: Hgb reviewed 9/2- decreased to 8.2, repeat Monday             --resume iron supplement.  13. Constipation: Will add Senna S    LOS: 1 days A FACE TO FACE EVALUATION WAS PERFORMED  Clide Deutscher Rodarius Kichline 11/18/2020, 12:35 PM

## 2020-11-18 NOTE — Progress Notes (Signed)
Occupational Therapy Session Note  Patient Details  Name: Jon Reid MRN: [id number] Date of Birth: [birth year]  Today's Date: 11/18/2020 OT Individual Time: 1105-1205 OT Individual Time Calculation (min): 60 min    Short Term Goals: Week 1:  OT Short Term Goal 1 (Week 1): Pt will sit to stand from EOB to RW with min A. OT Short Term Goal 2 (Week 1): Pt will be able to stand at [facility] with S while therapist A him with managing clothing over hips. OT Short Term Goal 3 (Week 1): Pt will be able to don pants over feet with reacher with min A. OT Short Term Goal 4 (Week 1): Pt will be able to self cleanse post toileting with mod A.   Skilled Therapeutic Interventions/Progress Updates:  Pt sitting up in recliner, although pelvis slid significantly forward and sacral sitting with bilateral lower legs hanging off footrest.  Pt required max assist to reposition safely in chair. Pt pleasantly discussing long term memories about self and intermittently attempting to describe something, however therapist unable to understand intent of pt descriptions. Pt completed sit<>stand at stedy with mod assist and transported to bathroom.  Initially plan was to trial commode seat transfer however noted then that pt had soiled brief of urine therefore clothing management completed with total assist due to pt requiring BUE for standing support and max multimodal cues to promote upright posture due to very kyphotic.  Pt required total assist for pericare as well.  Pt reports maybe he needs to try to use toilet again, therefore mod assist for 3 in1 commode (over toilet) transfer from stedy completed.  Pt unable to have continent episode.  Pt transported using stedy to TIS w/c and stand to sit with mod assist.  Attempted to provide pt with cervical extension gravity assisted stretch using head rest on TIS w/c however pt with some rigidity in cervical/thoracic kyphosis therefore placed pillow for support behind head.  Assessed  BUE PROM and AROM and all WNL.  Call bell in reach, seat belt alarm on. Possible word finding difficulties versus confusion exhibited. Will monitor, may benefit from SPT consult if presentation continues.  Therapy Documentation Precautions:  Precautions Precautions: Posterior Hip, Fall Precaution Comments: verbally reviewed Required Braces or Orthoses: Knee Immobilizer - Right Knee Immobilizer - Right:  (in bed only, off with therapies) Restrictions Weight Bearing Restrictions: No RLE Weight Bearing: Weight bearing as tolerated Other Position/Activity Restrictions: posterior hip precautions R LE    Therapy/Group: Individual Therapy  [name] 11/18/2020, 1:21 PM

## 2020-11-18 NOTE — Progress Notes (Signed)
Inpatient Rehabilitation  Patient information reviewed and entered into eRehab system by Euan Wandler M. Yates Weisgerber, M.A., CCC/SLP, PPS Coordinator.  Information including medical coding, functional ability and quality indicators will be reviewed and updated through discharge.    

## 2020-11-18 NOTE — Progress Notes (Signed)
Patient ID: Jon Reid, male   DOB: 01/22/1929, 87 y.o.   MRN: [id number] Met with the patient to introduce self and review role. Reviewed therapy schedule and plan of care. Discussed ELOS and dietary modification along with hip precautions. Continue to follow along to discharge to address educational needs. Sharp, [name] B  

## 2020-11-18 NOTE — Evaluation (Signed)
Occupational Therapy Assessment and Plan  Patient Details  Name: Jon Reid MRN: [id number] Date of Birth: 01-29-1929  OT Diagnosis: acute pain, muscle weakness (generalized), and pain in joint Rehab Potential: Rehab Potential (ACUTE ONLY): Good ELOS: 12-14 days   Today's Date: 11/18/2020 OT Individual Time: 7867-6720 OT Individual Time Calculation (min): 60 min     Hospital Problem: Principal Problem:   Femur fracture, right ([name]) Active Problems:   S/P hip hemiarthroplasty   Pressure injury of skin   Closed fracture of right hip requiring operative repair, sequela   Past Medical History:  Past Medical History:  Diagnosis Date   Arthritis    Carcinoma of prostate ([place])    prostate   CKD (chronic kidney disease), stage III ([place])    Coronary artery disease    a. 10/2012 Cath: LM 20-30d, lAD 80p, 59m 80-90d, LCX 90-972mOM1 90, RCA 50p, 20105m0d, RPDA 90, EF nl; b. 10/2012 CABG x 4 (LIMA-LAD, SVG-OM1, SVG-PDA->RPL); c. 07/2015 MV: Basal and proximal septal infarct w/o ischemia. EF 57%-->low risk.   First degree AV block 01/28/2019   Noted on EKG   GERD (gastroesophageal reflux disease)    Gout    HTN (hypertension)    Macrocytosis without anemia 01/08/2012   Moderate aortic stenosis    a. 10/2012 Echo: EF 55-60%, no rwma, BAE, mild AI, mild TR; b. 06/2020 Echo: EF 50-55%, no rwma, mild LVH. Mod dil LA. Mild MR. Mod AS.   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   Sleep apnea    Questionable   Spinal stenosis    [facility]  Past Surgical History:  Past Surgical History:  Procedure Laterality Date   BALLOON DILATION N/A 10/03/2020   Procedure: BALLOON DILATION;  Surgeon: CarEloise HarmanO;  [facility];  Service: Endoscopy;  Laterality: N/A;   CARDIAC CATHETERIZATION  11/07/2012   Dr NahAcie FredricksonCATARACT EXTRACTION W/ INTRAOCULAR LENS IMPLANT Bilateral    CORONARY ARTERY BYPASS  GRAFT N/A 11/11/2012   Procedure: CORONARY ARTERY BYPASS GRAFTING times four using Right Greater Saphenous Vein Graft harvested endoscopically and Left Internal Mammary Artery.;  Surgeon: PetIvin PootD;  [facility] [place] [facility];  Laterality: N/A;   ESOPHAGEAL BRUSHING  10/03/2020   Procedure: ESOPHAGEAL BRUSHING;  Surgeon: CarEloise HarmanO;  [facility];  Service: Endoscopy;;   ESOPHAGOGASTRODUODENOSCOPY (EGD) WITH PROPOFOL N/A 06/27/2020   Surgeon: CarEloise HarmanO;  large hiatal hernia, LA grade D esophagitis with no bleeding, normal examined duodenum.    ESOPHAGOGASTRODUODENOSCOPY (EGD) WITH PROPOFOL N/A 10/03/2020   Procedure: ESOPHAGOGASTRODUODENOSCOPY (EGD) WITH PROPOFOL;  Surgeon: CarEloise HarmanO;  [facility];  Service: Endoscopy;  Laterality: N/A;  2:00pm   HIP ARTHROPLASTY Right 11/13/2020   Procedure: ARTHROPLASTY BIPOLAR HIP (HEMIARTHROPLASTY);  Surgeon: MurRenette ButtersD;  [facility] [place];  Laterality: Right;   INGUINAL HERNIA REPAIR Right 01/10/2017   Procedure: OPEN RIGHT HERNIA REPAIR INGUINAL;  Surgeon: WhiIleana RoupD;  [facility];  Service: General;  Laterality: Right;   INSERTION OF MESH Right 01/10/2017   Procedure: INSERTION OF MESH;  Surgeon: WhiIleana RoupD;  [facility];  Service: General;  Laterality: Right;   INTRAOPERATIVE TRANSESOPHAGEAL ECHOCARDIOGRAM N/A 11/11/2012   Procedure: INTRAOPERATIVE TRANSESOPHAGEAL ECHOCARDIOGRAM;  Surgeon: PetTharon Aquasigt,  MD;  Location: [facility];  Service: Open Heart Surgery;  Laterality: N/A;   LEFT HEART CATHETERIZATION WITH CORONARY ANGIOGRAM N/A 11/07/2012   Procedure: LEFT HEART CATHETERIZATION WITH CORONARY ANGIOGRAM;  Surgeon: Thayer Headings, MD;  Location[facility] CATH LAB;  Service: Cardiovascular;  Laterality: N/A;   LUMBAR LAMINECTOMY/DECOMPRESSION MICRODISCECTOMY N/A 02/05/2019   Procedure: LUMBAR  LAMINECTOMY/DECOMPRESSION L3-L4;  Surgeon: Latanya Maudlin, MD;  Location: [facility];  Service: Orthopedics;  Laterality: N/A;  55mn   LYMPH NODE DISSECTION     Bilateral pelvic   RETROPUBIC PROSTATECTOMY     Radical    Assessment & Plan Clinical Impression:  [name]. [name] a 87 year old male with history of prostate CA, CKD, HTN, CAD, Parkinson's disease, spinal stenosis who was admitted on 11/11/20 after a fall onto right hip with subsequent right subcapital hip fracture.  He was also found to be SARS-CoV-2 positive without symptoms and felt to be older infection therefore isolation not required per ID.  He underwent right hip hemiarthroplasty the same day by Dr. MPercell Millerand postop is WBAT.  He has had issues with confusion as well as lethargy, acute blood loss anemia, shortness of breath with chest pain felt to be due to GERD as well as reactive leukocytosis without signs of infection.  He did report question of coughing with aspiration and chest x-ray done on 08/30 which showed minimal left basilar atelectasis and stable large hiatal hernia.  WBC trending down and acute blood loss anemia stable.  Therapy is ongoing and patient continues to be limited by confusion with mumbling speech at times, shuffling gait with intermittent freezing episodes, posterior lean, inability to maintain hip precautions as well as poor awareness of deficits.  CIR was recommended due to functional decline..  Patient transferred to CIR on 11/17/2020 .    Patient currently requires max with basic self-care skills secondary to muscle weakness and muscle joint tightness, decreased cardiorespiratoy endurance, decreased problem solving and delayed processing, and decreased sitting balance, decreased standing balance, and decreased balance strategies.  Prior to hospitalization, patient was independent and active.   Patient will benefit from skilled intervention to increase independence with basic self-care skills prior to  discharge home with care partner.  Anticipate patient will require 24 hour supervision and follow up home health.  OT - End of Session Activity Tolerance: Tolerates 10 - 20 min activity with multiple rests Endurance Deficit: Yes Endurance Deficit Description: pt has limited endurance. OT Assessment Rehab Potential (ACUTE ONLY): Good OT Patient demonstrates impairments in the following area(s): Balance;Cognition;Endurance;Motor;Pain OT Basic ADL's Functional Problem(s): Grooming;Bathing;Dressing;Toileting OT Transfers Functional Problem(s): Toilet;Tub/Shower OT Additional Impairment(s): None OT Plan OT Intensity: Minimum of 1-2 x/day, 45 to 90 minutes OT Frequency:  (QD schedule at this time) OT Duration/Estimated Length of Stay: 12-14 days OT Treatment/Interventions: Balance/vestibular training;Discharge planning;Functional mobility training;DME/adaptive equipment instruction;Patient/family education;Therapeutic Activities;Self Care/advanced ADL retraining;Therapeutic Exercise;UE/LE Strength taining/ROM;Pain management;Psychosocial support OT Self Feeding Anticipated Outcome(s): independent OT Basic Self-Care Anticipated Outcome(s): supervision OT Toileting Anticipated Outcome(s): supervision OT Bathroom Transfers Anticipated Outcome(s): supervision OT Recommendation Patient destination: Home Follow Up Recommendations: Home health OT Equipment Recommended: 3 in 1 bedside comode;To be determined   OT Evaluation Precautions/Restrictions  Precautions Precautions: Posterior Hip;Fall Precaution Comments: verbally reviewed Required Braces or Orthoses: Knee Immobilizer - Right Knee Immobilizer - Right:  (in bed only, off with therapies) Restrictions Weight Bearing Restrictions: No RLE Weight Bearing: Weight bearing as tolerated Other Position/Activity Restrictions: posterior hip precautions R LE  Vital Signs Therapy  Vitals Temp: 99 F (37.2 C) Temp Source: Oral Pulse Rate:  66 Resp: 18 BP: (!) 100/55 Patient Position (if appropriate): Sitting Oxygen Therapy SpO2: 94 % O2 Device: Room Air Pain Pain Assessment Pain Score: 0-No pain Home Living/Prior Functioning Home Living Family/patient expects to be discharged to:: Private residence Living Arrangements: Spouse/significant other, Children Available Help at Discharge: Family, Available PRN/intermittently Type of Home: House Home Access: Stairs to enter [employer] of Steps: 1 step in the back - pt stated they built a level entry to back [place]: Able to live on main level with bedroom/bathroom, Two level Alternate Level Stairs-Number of Steps: flight Bathroom Shower/Tub: Tub/shower unit, Curtain Additional Comments: has lots of DME but does not use it ; has had 10-11 falls recently  Lives With: Significant other, Son, Spouse Prior Function Level of Independence: Independent with basic ADLs  Able to Take Stairs?: Yes Driving: Yes Vocation: Retired Surveyor, mining Baseline Vision/History: 0 No visual deficits Ability to See in Adequate Light: 0 Adequate Patient Visual Report: No change from baseline Vision Assessment?: No apparent visual deficits Perception  Perception: Within Functional Limits Praxis Praxis: Intact Cognition Overall Cognitive Status: Within Functional Limits for tasks assessed Arousal/Alertness: Awake/alert Orientation Level: Person;Place;Situation Person: Oriented Place: Oriented Situation: Oriented Year: 2022 Month: September Day of Week: Correct Memory: Appears intact Immediate Memory Recall: Sock;Blue;Bed Memory Recall Sock: With Cue Memory Recall Blue: Without Cue Memory Recall Bed: Without Cue Awareness: Appears intact Problem Solving: Impaired (needs cues to figure out how to move with restrictions) Safety/Judgment: Appears intact Sensation Sensation Light Touch: Appears Intact Hot/Cold: Appears Intact Proprioception: Appears  Intact Stereognosis: Appears Intact Coordination Gross Motor Movements are Fluid and Coordinated: No Fine Motor Movements are Fluid and Coordinated: Yes Coordination and Movement Description: limited by pain and tight joint structures Finger Nose Finger Test: not assessed Motor  Motor Motor - Skilled Clinical Observations: generalized motor weakness  Trunk/Postural Assessment  Cervical Assessment Cervical Assessment: Exceptions to [facility] (forward head) Thoracic Assessment Thoracic Assessment: Exceptions to [facility] (kyphotic posture) Lumbar Assessment Lumbar Assessment: Exceptions to [facility] (posterior pelvic tilt) Postural Control Postural Control: Deficits on evaluation (in sitting, leans to left - appears pt is avoiding putting wt on R hip)  Balance Static Sitting Balance Static Sitting - Level of Assistance: 5: Stand by assistance Dynamic Sitting Balance Dynamic Sitting - Level of Assistance: 3: Mod assist;4: Min assist Static Standing Balance Static Standing - Level of Assistance: 1: +2 Total assist;Other (comment) (needs A from stedy) Extremity/Trunk Assessment RUE Assessment Active Range of Motion (AROM) Comments: WFL General Strength Comments: 4/5 LUE Assessment Active Range of Motion (AROM) Comments: WFL General Strength Comments: 4/5  Care Tool Care Tool Self Care Eating   Eating Assist Level: Set up assist    Oral Care    Oral Care Assist Level: Set up assist    Bathing   Body parts bathed by patient: Right arm;Left arm;Chest;Abdomen;Front perineal area;Face Body parts bathed by helper: Buttocks;Right upper leg;Left upper leg;Right lower leg;Left lower leg   Assist Level: Moderate Assistance - Patient 50 - 74%    Upper Body Dressing(including orthotics)   What is the patient wearing?: Pull over shirt   Assist Level: Set up assist    Lower Body Dressing (excluding footwear)   What is the patient wearing?: Incontinence brief;Pants Assist for lower body dressing:  Maximal Assistance - Patient 25 - 49%    Putting on/Taking off footwear   What is the patient wearing?: Non-skid slipper socks;Ted hose  Assist for footwear: Total Assistance - Patient < 25%       Care Tool Toileting Toileting activity   Assist for toileting: Total Assistance - Patient < 25%     Care Tool Bed Mobility Roll left and right activity    Max A    Sit to lying activity   Sit to lying assist level: Maximal Assistance - Patient 25 - 49%    Lying to sitting on side of bed activity    Max A     Care Tool Transfers Sit to stand transfer   Sit to stand assist level: Maximal Assistance - Patient 25 - 49%    Chair/bed transfer   Chair/bed transfer assist level: Dependent - mechanical lift     Toilet transfer   Assist Level: Dependent - Patient 0%     Care Tool Cognition  Expression of Ideas and Wants Expression of Ideas and Wants: 3. Some difficulty - exhibits some difficulty with expressing needs and ideas (e.g, some words or finishing thoughts) or speech is not clear  Understanding Verbal and Non-Verbal Content Understanding Verbal and Non-Verbal Content: 3. Usually understands - understands most conversations, but misses some part/intent of message. Requires cues at times to understand   Memory/Recall Ability Memory/Recall Ability : Current season;That he or she is in a hospital/hospital unit   Refer to Care Plan for [place] OT Short Term Goal 1 (Week 1): Pt will sit to stand from EOB to RW with min A. OT Short Term Goal 2 (Week 1): Pt will be able to stand at [facility] with S while therapist A him with managing clothing over hips. OT Short Term Goal 3 (Week 1): Pt will be able to don pants over feet with reacher with min A. OT Short Term Goal 4 (Week 1): Pt will be able to self cleanse post toileting with mod A.  Recommendations for other services: None    Skilled Therapeutic Intervention ADL ADL Eating: Set up Grooming:  Setup Upper Body Bathing: Supervision/safety Where Assessed-Upper Body Bathing: Edge of bed Lower Body Bathing: Maximal assistance Where Assessed-Lower Body Bathing: Edge of bed Upper Body Dressing: Supervision/safety Where Assessed-Upper Body Dressing: Edge of bed Lower Body Dressing: Dependent Where Assessed-Lower Body Dressing: Edge of bed Toileting: Dependent Where Assessed-Toileting: Glass blower/designer: Dependent Armed forces technical officer Method: Other (comment) (stedy) Toilet Transfer Equipment: Raised toilet seat   Pt seen for initial evaluation and ADL training. Pt stated he had no pain but he rarely allowed himself to put wt on his R hip in sitting or standing.  Overall he needs max A to total with LB self care and mobility due to strong posterior lean and kyphotic posture.  Pt did participate well and is quite motivated. Discussed role of OT, pt's goals and ELOS. Pt resting in recliner with all needs met.    Discharge Criteria: Patient will be discharged from OT if patient refuses treatment 3 consecutive times without medical reason, if treatment goals not met, if there is a change in medical status, if patient makes no progress towards goals or if patient is discharged from hospital.  The above assessment, treatment plan, treatment alternatives and goals were discussed and mutually agreed upon: by patient  [facility] 11/18/2020, 12:56 PM

## 2020-11-18 NOTE — Evaluation (Addendum)
Physical Therapy Assessment and Plan  Patient Details  Name: Jon Reid MRN: [id number] Date of Birth: 1928/07/17  PT Diagnosis: Abnormal posture, Difficulty walking, Muscle weakness, and Pain in joint Rehab Potential: Good ELOS: 12-14 days   Today's Date: 11/18/2020 PT Individual Time: 1535-1630 PT Individual Time Calculation (min): 37 min    Hospital Problem: Principal Problem:   Femur fracture, right ([place]) Active Problems:   S/P hip hemiarthroplasty   Pressure injury of skin   Closed fracture of right hip requiring operative repair, sequela   Past Medical History:  Past Medical History:  Diagnosis Date   Arthritis    Carcinoma of prostate ([place])    prostate   CKD (chronic kidney disease), stage III ([place])    Coronary artery disease    a. 10/2012 Cath: LM 20-30d, lAD 80p, 14m 80-90d, LCX 90-987mOM1 90, RCA 50p, 2079m0d, RPDA 90, EF nl; b. 10/2012 CABG x 4 (LIMA-LAD, SVG-OM1, SVG-PDA->RPL); c. 07/2015 MV: Basal and proximal septal infarct w/o ischemia. EF 57%-->low risk.   First degree AV block 01/28/2019   Noted on EKG   GERD (gastroesophageal reflux disease)    Gout    HTN (hypertension)    Macrocytosis without anemia 01/08/2012   Moderate aortic stenosis    a. 10/2012 Echo: EF 55-60%, no rwma, BAE, mild AI, mild TR; b. 06/2020 Echo: EF 50-55%, no rwma, mild LVH. Mod dil LA. Mild MR. Mod AS.   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   Sleep apnea    Questionable   Spinal stenosis    [facility]  Past Surgical History:  Past Surgical History:  Procedure Laterality Date   BALLOON DILATION N/A 10/03/2020   Procedure: BALLOON DILATION;  Surgeon: CarEloise HarmanO;  [facility];  Service: Endoscopy;  Laterality: N/A;   CARDIAC CATHETERIZATION  11/07/2012   Dr NahAcie FredricksonCATARACT EXTRACTION W/ INTRAOCULAR LENS IMPLANT Bilateral    CORONARY ARTERY BYPASS GRAFT N/A 11/11/2012    Procedure: CORONARY ARTERY BYPASS GRAFTING times four using Right Greater Saphenous Vein Graft harvested endoscopically and Left Internal Mammary Artery.;  Surgeon: PetIvin PootD;  [facility];  Laterality: N/A;   ESOPHAGEAL BRUSHING  10/03/2020   Procedure: ESOPHAGEAL BRUSHING;  Surgeon: CarEloise HarmanO;  [facility];  Service: Endoscopy;;   ESOPHAGOGASTRODUODENOSCOPY (EGD) WITH PROPOFOL N/A 06/27/2020   Surgeon: CarEloise HarmanO;  large hiatal hernia, LA grade D esophagitis with no bleeding, normal examined duodenum.    ESOPHAGOGASTRODUODENOSCOPY (EGD) WITH PROPOFOL N/A 10/03/2020   Procedure: ESOPHAGOGASTRODUODENOSCOPY (EGD) WITH PROPOFOL;  Surgeon: CarEloise HarmanO;  [facility];  Service: Endoscopy;  Laterality: N/A;  2:00pm   HIP ARTHROPLASTY Right 11/13/2020   Procedure: ARTHROPLASTY BIPOLAR HIP (HEMIARTHROPLASTY);  Surgeon: MurRenette ButtersD;  [facility];  Laterality: Right;   INGUINAL HERNIA REPAIR Right 01/10/2017   Procedure: OPEN RIGHT HERNIA REPAIR INGUINAL;  Surgeon: WhiIleana RoupD;  [facility];  Service: General;  Laterality: Right;   INSERTION OF MESH Right 01/10/2017   Procedure: INSERTION OF MESH;  Surgeon: WhiIleana RoupD;  [facility];  Service: General;  Laterality: Right;   INTRAOPERATIVE TRANSESOPHAGEAL ECHOCARDIOGRAM N/A 11/11/2012   Procedure: INTRAOPERATIVE TRANSESOPHAGEAL ECHOCARDIOGRAM;  Surgeon: PetIvin PootD;  [facility];  Service: Open Heart Surgery;  Laterality: N/A;   LEFT HEART CATHETERIZATION WITH CORONARY ANGIOGRAM N/A 11/07/2012   Procedure: LEFT HEART CATHETERIZATION WITH CORONARY ANGIOGRAM;  Surgeon: Thayer Headings, MD;  Location: [facility] CATH LAB;  Service: Cardiovascular;  Laterality: N/A;   LUMBAR LAMINECTOMY/DECOMPRESSION MICRODISCECTOMY N/A 02/05/2019   Procedure: LUMBAR LAMINECTOMY/DECOMPRESSION L3-L4;  Surgeon:  Latanya Maudlin, MD;  Location: [facility];  Service: Orthopedics;  Laterality: N/A;  35mn   LYMPH NODE DISSECTION     Bilateral pelvic   RETROPUBIC PROSTATECTOMY     Radical    Assessment & Plan Clinical Impression: Patient is a 87 year old male with history of prostate CA, CKD, HTN, CAD, Parkinson's disease, spinal stenosis who was admitted on 11/11/20 after a fall onto right hip with subsequent right subcapital hip fracture.  He was also found to be SARS-CoV-2 positive without symptoms and felt to be older infection therefore isolation not required per ID.  He underwent right hip hemiarthroplasty the same day by Dr. MPercell Millerand postop is WBAT.  He has had issues with confusion as well as lethargy, acute blood loss anemia, shortness of breath with chest pain felt to be due to GERD as well as reactive leukocytosis without signs of infection.  He did report question of coughing with aspiration and chest x-ray done on 08/30 which showed minimal left basilar atelectasis and stable large hiatal hernia.  WBC trending down and acute blood loss anemia stable.  Therapy is ongoing and patient continues to be limited by confusion with mumbling speech at times, shuffling gait with intermittent freezing episodes, posterior lean, inability to maintain hip precautions as well as poor awareness of deficits. Patient transferred to CIR on 11/17/2020 .   Patient currently requires max with mobility secondary to muscle weakness and muscle joint tightness, decreased cardiorespiratoy endurance and decreased oxygen support, and decreased sitting balance, decreased standing balance, decreased balance strategies, and difficulty maintaining precautions.  Prior to hospitalization, patient was independent  with mobility and lived with Significant other, Son, Spouse in a House home.  Home access is 1 step in the back - pt stated they built a level entry to back porchStairs to enter.  Patient will benefit from skilled PT intervention to  maximize safe functional mobility, minimize fall risk, and decrease caregiver burden for planned discharge home with 24 hour assist.  Anticipate patient will benefit [facility].  PT - End of Session Activity Tolerance: Tolerates < 10 min activity, no significant change in vital signs Endurance Deficit: Yes Endurance Deficit Description: pt has limited endurance. PT Assessment Rehab Potential (ACUTE/IP ONLY): Good PT Barriers to Discharge: Decreased caregiver support;Home environment access/layout;Insurance for SNF coverage;Medication compliance PT Patient demonstrates impairments in the following area(s): Balance;Behavior;Endurance;Motor;Safety;Skin Integrity PT Transfers Functional Problem(s): Bed Mobility;Bed to Chair;Car;Furniture;Floor PT Locomotion Functional Problem(s): Ambulation;Wheelchair Mobility;Stairs PT Plan PT Intensity: Minimum of 1-2 x/day ,45 to 90 minutes PT Frequency: 5 out of 7 days PT Duration Estimated Length of Stay: 12-14 days PT Treatment/Interventions: Ambulation/gait training;Community reintegration;DME/adaptive equipment instruction;Psychosocial support;Stair training;UE/LE Strength taining/ROM;Wheelchair propulsion/positioning;Neuromuscular re-education;Balance/vestibular training;Discharge planning;Pain management;Skin care/wound management;Therapeutic Activities;UE/LE Coordination activities;Visual/perceptual remediation/compensation;Therapeutic Exercise;Splinting/orthotics;Patient/family education;Functional mobility training;Disease management/prevention;Cognitive remediation/compensation PT Transfers Anticipated Outcome(s): Min assist with LRAD PT Locomotion Anticipated Outcome(s): Ambulatory with min assist at household distances. PT Recommendation Recommendations for Other Services: Therapeutic Recreation consult Therapeutic Recreation Interventions: Stress management;Outing/community reintergration Follow Up Recommendations: Home health  PT Patient destination: Home Equipment Recommended: Wheelchair (measurements);Wheelchair cushion (measurements);Rolling walker with 5" wheels;To be determined  PT Evaluation Precautions/Restrictions   Fall. Hip Covid  General   Vital SignsTherapy Vitals Temp: 99 F (37.2 C) Temp Source: Oral Pulse Rate: 66 Resp: 18 BP: (!) 100/55 Patient Position (if appropriate): Sitting Oxygen Therapy SpO2: 94 % O2 Device: Room Air Pain   Denies at rest  Pain Interference Pain Interference Pain Effect on Sleep: 0. Does not apply - I have not had any pain or hurting in the past 5 days Pain Interference with Therapy Activities: 3. Frequently Pain Interference with Day-to-Day Activities: 4. Almost constantly Home Living/Prior Functioning Home Living Available Help at Discharge: Family;Available PRN/intermittently Type of Home: House Home Access: Stairs to enter [employer] of Steps: 1 step in the back - pt stated they built a level entry to back [place]: Able to live on main level with bedroom/bathroom;Two level Alternate Level Stairs-Number of Steps: flight Bathroom Shower/Tub: Tub/shower unit;Curtain Additional Comments: has lots of DME but does not use it ; has had 10-11 falls recently  Lives With: Significant other;Son;Spouse Prior Function  Able to Take Stairs?: Yes Driving: Yes Vision/Perception  Vision - History Ability to See in Adequate Light: 0 Adequate Perception Perception: Within Functional Limits Praxis Praxis: Intact  Cognition Overall Cognitive Status: Within Functional Limits for tasks assessed Orientation Level: Oriented X4 Safety/Judgment: Appears intact Sensation Sensation Light Touch: Appears Intact Hot/Cold: Appears Intact Proprioception: Appears Intact Stereognosis: Appears Intact Coordination Gross Motor Movements are Fluid and Coordinated: No Fine Motor Movements are Fluid and Coordinated: Yes Coordination and Movement  Description: limited by pain and tight joint structures Finger Nose Finger Test: not assessed Motor  Motor Motor: Other (comment) Motor - Skilled Clinical Observations: generalized motor weakness   Trunk/Postural Assessment  Cervical Assessment Cervical Assessment: Exceptions to [facility] (forward head) Thoracic Assessment Thoracic Assessment: Exceptions to [facility] (kyphotic posture) Lumbar Assessment Lumbar Assessment: Exceptions to [facility] (posterior pelvic tilt) Postural Control Postural Control: Deficits on evaluation (in sitting, leans to left - appears pt is avoiding putting wt on R hip)  Balance Static Sitting Balance Static Sitting - Level of Assistance: 5: Stand by assistance Dynamic Sitting Balance Dynamic Sitting - Level of Assistance: 4: Min assist Static Standing Balance Static Standing - Level of Assistance: 3: Mod assist (BUE support on RW) Dynamic Standing Balance Dynamic Standing - Balance Support: Bilateral upper extremity supported Dynamic Standing - Level of Assistance: 3: Mod assist;2: Max assist Dynamic Standing - Comments: with gait and turns. Extremity Assessment      RLE Assessment RLE Assessment: Exceptions to [facility] RLE Strength Right Hip Flexion: 3-/5 Right Hip Extension: 3+/5 Right Hip ABduction: 3+/5 Right Knee Flexion: 4-/5 Right Knee Extension: 4-/5 Right Ankle Dorsiflexion: 4-/5 LLE Assessment LLE Assessment: Exceptions to [facility] General Strength Comments: grossly 4-/5 proximal to distal functionally.  Care Tool Care Tool Bed Mobility Roll left and right activity   Roll left and right assist level: Maximal Assistance - Patient 25 - 49%    Sit to lying activity   Sit to lying assist level: Maximal Assistance - Patient 25 - 49%    Lying to sitting on side of bed activity   Lying to sitting on side of bed assist level: the ability to move from lying on the back to sitting on the side of the bed with no back support.: Maximal Assistance - Patient 25 -  49%     Care Tool Transfers Sit to stand transfer   Sit to stand assist level: Maximal Assistance - Patient 25 - 49%  Chair/bed transfer   Chair/bed transfer assist level: Maximal Assistance - Patient 25 - 49%     Psychologist, counselling transfer activity did not occur: Safety/medical concerns        Care Tool Locomotion Ambulation Ambulation activity did not occur: Safety/medical concerns        Walk 10 feet activity Walk 10 feet activity did not occur: Safety/medical concerns       Walk 50 feet with 2 turns activity Walk 50 feet with 2 turns activity did not occur: Safety/medical concerns      Walk 150 feet activity Walk 150 feet activity did not occur: Safety/medical concerns      Walk 10 feet on uneven surfaces activity Walk 10 feet on uneven surfaces activity did not occur: Safety/medical concerns      Stairs Stair activity did not occur: Safety/medical concerns        Walk up/down 1 step activity Walk up/down 1 step or curb (drop down) activity did not occur: Safety/medical concerns     Walk up/down 4 steps activity did not occuR: Safety/medical concerns  Walk up/down 4 steps activity      Walk up/down 12 steps activity Walk up/down 12 steps activity did not occur: Safety/medical concerns      Pick up small objects from floor Pick up small object from the floor (from standing position) activity did not occur: Safety/medical concerns      Wheelchair Is the patient using a wheelchair?: Yes Type of Wheelchair: Manual Wheelchair activity did not occur: Refused      Wheel 50 feet with 2 turns activity Wheelchair 50 feet with 2 turns activity did not occur: Refused    Wheel 150 feet activity Wheelchair 150 feet activity did not occur: Refused      Refer to Care Plan for Long Term Goals  SHORT TERM GOAL WEEK 1 PT Short Term Goal 1 (Week 1): Pt will perform bed mobility with mod assist PT Short Term Goal 2 (Week 1): Pt will ambulate  61f with mod assist PT Short Term Goal 3 (Week 1): Pt will propell WC with supervision assist around room PT Short Term Goal 4 (Week 1): Pt will consistently transfer with mod assist to and from Bed  Recommendations for other services: Therapeutic Recreation  Stress management and Outing/community reintegration  Skilled Therapeutic Intervention Pt received sitting in WC and agreeable to PT. PT instructed patient in PT Evaluation and initiated treatment intervention; see above for results. PT educated patient in [place] rehab potential, rehab goals, and discharge recommendations along with recommendation for follow-up rehabilitation services. Sit<>stand x 3 with max progressing to mod assist. Ambulatory transfer to bed with mod-max assist. No knee buckling noted. Increasing step length with increased distance. Sit>supine as listed below. Pt left in bed with call bell in reach and all needs met.    VS aassessment:  Sitting. 121/72, HR 78  Following stanidng 91/67, HR 75.  2 min rest sitting 117/59. HR 63 asymptomatic.     Mobility Bed Mobility Bed Mobility: Rolling Right;Rolling Left;Supine to Sit;Sit to Supine Rolling Right: Maximal Assistance - Patient 25-49% Rolling Left: Maximal Assistance - Patient 25-49% Sit to Supine: Maximal Assistance - Patient 25-49% Transfers Transfers: Sit to Stand;Stand Pivot Transfers;Stand to Sit Sit to Stand: Moderate Assistance - Patient 50-74%;Maximal Assistance - Patient 25-49% Stand to Sit: Moderate Assistance - Patient 50-74%;Maximal Assistance - Patient 25-49% Stand Pivot Transfers: Moderate Assistance - Patient 50 -  74%;Maximal Assistance - Patient 25 - 49% Stand Pivot Transfer Details (indicate cue type and reason): improved with repetition and use of 1 UE to push from [facility] Locomotion  Gait Ambulation: Yes Gait Assistance: Moderate Assistance - Patient 50-74% Gait Distance (Feet): 5 Feet Assistive device: Rolling walker Gait Assistance Details:  Verbal cues for gait pattern;Verbal cues for safe use of DME/AE;Verbal cues for precautions/safety;Verbal cues for technique Gait Assistance Details: unable to ambulate without AD due to pain and weakness in the RLE Gait Gait: Yes Gait Pattern: Antalgic Stairs / Additional Locomotion Stairs: No Wheelchair Mobility Wheelchair Mobility: No   Discharge Criteria: Patient will be discharged from PT if patient refuses treatment 3 consecutive times without medical reason, if treatment goals not met, if there is a change in medical status, if patient makes no progress towards goals or if patient is discharged from hospital.  The above assessment, treatment plan, treatment alternatives and goals were discussed and mutually agreed upon: by patient  Lorie Phenix 11/19/2020, 7:29 AM

## 2020-11-18 NOTE — Progress Notes (Signed)
Inpatient Rehabilitation Care Coordinator Assessment and Plan Patient Details  Name: Jon Reid MRN: [id number] Date of Birth: 1928-09-02  Today's Date: 11/18/2020  Hospital Problems: Principal Problem:   Femur fracture, right ([place]) Active Problems:   S/P hip hemiarthroplasty   Pressure injury of skin   Closed fracture of right hip requiring operative repair, sequela  Past Medical History:  Past Medical History:  Diagnosis Date   Arthritis    Carcinoma of prostate ([place])    prostate   CKD (chronic kidney disease), stage III ([place])    Coronary artery disease    a. 10/2012 Cath: LM 20-30d, lAD 80p, 35m 80-90d, LCX 90-953mOM1 90, RCA 50p, 2060m0d, RPDA 90, EF nl; b. 10/2012 CABG x 4 (LIMA-LAD, SVG-OM1, SVG-PDA->RPL); c. 07/2015 MV: Basal and proximal septal infarct w/o ischemia. EF 57%-->low risk.   First degree AV block 01/28/2019   Noted on EKG   GERD (gastroesophageal reflux disease)    Gout    HTN (hypertension)    Macrocytosis without anemia 01/08/2012   Moderate aortic stenosis    a. 10/2012 Echo: EF 55-60%, no rwma, BAE, mild AI, mild TR; b. 06/2020 Echo: EF 50-55%, no rwma, mild LVH. Mod dil LA. Mild MR. Mod AS.   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   Sleep apnea    Questionable   Spinal stenosis    [facility]  Past Surgical History:  Past Surgical History:  Procedure Laterality Date   BALLOON DILATION N/A 10/03/2020   Procedure: BALLOON DILATION;  Surgeon: CarEloise HarmanO;  [facility];  Service: Endoscopy;  Laterality: N/A;   CARDIAC CATHETERIZATION  11/07/2012   Dr NahAcie FredricksonCATARACT EXTRACTION W/ INTRAOCULAR LENS IMPLANT Bilateral    CORONARY ARTERY BYPASS GRAFT N/A 11/11/2012   Procedure: CORONARY ARTERY BYPASS GRAFTING times four using Right Greater Saphenous Vein Graft harvested endoscopically and Left Internal Mammary Artery.;  Surgeon: PetIvin PootD;   [facility];  Laterality: N/A;   ESOPHAGEAL BRUSHING  10/03/2020   Procedure: ESOPHAGEAL BRUSHING;  Surgeon: CarEloise HarmanO;  [facility];  Service: Endoscopy;;   ESOPHAGOGASTRODUODENOSCOPY (EGD) WITH PROPOFOL N/A 06/27/2020   Surgeon: CarEloise HarmanO;  large hiatal hernia, LA grade D esophagitis with no bleeding, normal examined duodenum.    ESOPHAGOGASTRODUODENOSCOPY (EGD) WITH PROPOFOL N/A 10/03/2020   Procedure: ESOPHAGOGASTRODUODENOSCOPY (EGD) WITH PROPOFOL;  Surgeon: CarEloise HarmanO;  [facility];  Service: Endoscopy;  Laterality: N/A;  2:00pm   HIP ARTHROPLASTY Right 11/13/2020   Procedure: ARTHROPLASTY BIPOLAR HIP (HEMIARTHROPLASTY);  Surgeon: MurRenette ButtersD;  [facility];  Laterality: Right;   INGUINAL HERNIA REPAIR Right 01/10/2017   Procedure: OPEN RIGHT HERNIA REPAIR INGUINAL;  Surgeon: WhiIleana RoupD;  [facility];  Service: General;  Laterality: Right;   INSERTION OF MESH Right 01/10/2017   Procedure: INSERTION OF MESH;  Surgeon: WhiIleana RoupD;  [facility];  Service: General;  Laterality: Right;   INTRAOPERATIVE TRANSESOPHAGEAL ECHOCARDIOGRAM N/A 11/11/2012   Procedure: INTRAOPERATIVE TRANSESOPHAGEAL ECHOCARDIOGRAM;  Surgeon: PetIvin PootD;  [facility];  Laterality: N/A;   LEFT HEART CATHETERIZATION WITH CORONARY ANGIOGRAM N/A 11/07/2012   Procedure: LEFT HEART CATHETERIZATION WITH CORONARY ANGIOGRAM;  Surgeon: PhiWonda Chenghser,  MD;  Location: [facility];  Service: Cardiovascular;  Laterality: N/A;   LUMBAR LAMINECTOMY/DECOMPRESSION MICRODISCECTOMY N/A 02/05/2019   Procedure: LUMBAR LAMINECTOMY/DECOMPRESSION L3-L4;  Surgeon: Latanya Maudlin, MD;  [facility];  Service: Orthopedics;  Laterality: N/A;  37mn   LYMPH NODE DISSECTION     Bilateral pelvic   RETROPUBIC PROSTATECTOMY     Radical   Social  History:  reports that he has never smoked. He has never used smokeless tobacco. He reports current alcohol use. He reports that he does not use drugs.  Family / Support Systems Marital Status: Married Patient Roles: Spouse, Parent Spouse/Significant Other: [name]: LTruman Haywardand Lynn-twin boys [phone] [id number] John-son  [id number] Caregiver: BInez Catalinaand son's Ability/Limitations of Caregiver: Wife is elderly and has neuropathy can only provide supervision level. Twin boys run the farm and are in and out Caregiver Availability: 24/7 (need to see how much care pt will be if wife can manage him at DC) Family Dynamics: Close knit with three boys, two live with them and the other lives close-brought Mom's parents home. 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 Guardian/Conservator: None-according to MD pt is not fully capable of making his own decisions since no formal POA/HCPOA in place will look toward his wife and son's   Abuse/Neglect Abuse/Neglect Assessment Can Be Completed: Yes Physical Abuse: Denies Verbal Abuse: Denies Sexual Abuse: Denies Exploitation of patient/patient's resources: Denies Self-Neglect: Denies  Patient response to: Social Isolation - How often do you feel lonely or isolated from those around you?: Never  Emotional Status Pt's affect, behavior and adjustment status: Pt is wanting to get stronger and back to walking he feels he has laid in a bed for days and is even weaker now. He was not one to sit still and was alwyas working,but  feels the past six months he has been in and out of a hospital. He wants to get moving but not go home until is Recent Psychosocial Issues: other health issus and multiple hospitalizations Psychiatric History: No history deferred depression due to adjusting to the new unit and reahb. He is glad to be here and to be getting up out of bed with therapies. Substance Abuse History: No issues  Patient / Family Perceptions, Expectations & Goals Pt/Family understanding of illness & functional limitations: Pt and son's have a good understanding of his illnesses and fracture. They talk with the MD and feel they have a good understanding of his plan going forwad. On acute Palliative was consulted and goals of care set with pt and family Premorbid pt/family roles/activities: Father, husband, grandfather, retiree, farmer, etc Anticipated changes in roles/activities/participation: resume Pt/family expectations/goals: Pt states: " I want to be moving to go home and not burdne my wife."  Wife states: " I hope he does well there."  [employer] None Premorbid Home Care/DME Agencies: None Transportation available at discharge: son's Is the patient able to respond to transportation needs?: Yes In the past 12 months, has lack of transportation kept you from medical appointments or from getting medications?: No In the past 12 months, has lack of transportation  kept you from meetings, work, or from getting things needed for daily living?: No  Discharge Planning Living Arrangements: Spouse/significant other, Children Support Systems: Spouse/significant other, Children, Friends/neighbors Type of Residence: Private residence Insurance Resources: [employer] Financial Resources: [place] Referred: No Living Expenses: Own Money Management: Family, Patient Does the patient have any problems obtaining your medications?: No Home Management: Son's and  wife Patient/Family Preliminary Plans: Return home with wife and twin son's wife has health issues and twin boys run the farm and are in and out. He will need to be supervision for wife to manage him while son';s are working., Will await therapy team evaluations and work on safe discharge plan. Care Coordinator Barriers to Discharge: Decreased caregiver support Care Coordinator Anticipated Follow Up Needs: HH/OP  Clinical Impression Pleasant gentleman who is motivated to get moving and glad to be here on rehab. He has three son's and his wife who are involved and will assist, but wife is limited with what she can provide. Will await team's evaluations and work on discharge needs.  Elease Hashimoto 11/18/2020, 10:14 AM

## 2020-11-18 NOTE — Progress Notes (Signed)
Inpatient [name] Statement of Services  Patient Name:  Jon Reid  Date:  11/18/2020  Welcome to the [name]  Our goal is to provide you with an individualized program based on your diagnosis and situation, designed to meet your specific needs.  With this comprehensive rehabilitation program, you will be expected to participate in at least 3 hours of rehabilitation therapies Monday-Friday, with modified therapy programming on the weekends.  Your rehabilitation program will include the following services:  Physical Therapy (PT), Occupational Therapy (OT), Speech Therapy (ST), 24 hour per day rehabilitation nursing, Care Coordinator, Rehabilitation Medicine, Nutrition Services, and Pharmacy Services  Weekly team conferences will be held on Tuesday to discuss your progress.  Your Inpatient Rehabilitation Care Coordinator will talk with you frequently to get your input and to update you on team discussions.  Team conferences with you and your family in attendance may also be held.  Expected length of stay: 12-14 days  Overall anticipated outcome: supervision-min level  Depending on your progress and recovery, your program may change. Your Inpatient Rehabilitation Care Coordinator will coordinate services and will keep you informed of any changes. Your Inpatient Rehabilitation Care Coordinator's name and contact numbers are listed  below.  The following services may also be recommended but are not provided by the [facility]:   [place] will be made to provide these services after discharge if needed.  Arrangements include referral to agencies that provide these services.  Your insurance has been verified to be:  Network engineer and [facility] Your primary doctor is:  Claretta Fraise  Pertinent information will be shared with your doctor and your  insurance company.  Inpatient Rehabilitation Care Coordinator:  Ovidio Kin, [place] or Emilia Beck  Information discussed with and copy given to patient by: Elease Hashimoto, 11/18/2020, 10:16 AM

## 2020-11-18 NOTE — Progress Notes (Signed)
Inpatient Rehabilitation Medication Review by a Pharmacist Hold Lasix at this time for soft BP per Algis Liming, PA  Tawnee Clegg S. Alford Highland, PharmD, BCPS Clinical Staff Pharmacist [email or web address]

## 2020-11-18 NOTE — Progress Notes (Signed)
Occupational Therapy Note  Patient Details  Name: HOLDEN SOUTHARDS MRN: [id number] Date of Birth: [birth year]    Therapy team recommending QD treatment at this time due to pt's medical status.     [place] 11/18/2020, 11:20 AM

## 2020-11-19 ENCOUNTER — Inpatient Hospital Stay (HOSPITAL_COMMUNITY): Payer: MEDICARE

## 2020-11-19 DIAGNOSIS — R509 Fever, unspecified: Secondary | ICD-10-CM

## 2020-11-19 DIAGNOSIS — U071 COVID-19: Secondary | ICD-10-CM

## 2020-11-19 LAB — COMPREHENSIVE METABOLIC PANEL
ALT: 6 U/L (ref 0–44)
AST: 24 U/L (ref 15–41)
Albumin: 2.2 g/dL — ABNORMAL LOW (ref 3.5–5.0)
Alkaline Phosphatase: 78 U/L (ref 38–126)
Anion gap: 6 (ref 5–15)
BUN: 22 mg/dL (ref 8–23)
CO2: 27 mmol/L (ref 22–32)
Calcium: 8.1 mg/dL — ABNORMAL LOW (ref 8.9–10.3)
Chloride: 104 mmol/L (ref 98–111)
Creatinine, Ser: 1.36 mg/dL — ABNORMAL HIGH (ref 0.61–1.24)
GFR, Estimated: 49 mL/min — ABNORMAL LOW (ref >60)
Glucose, Bld: 110 mg/dL — ABNORMAL HIGH (ref 70–99)
Potassium: 3.8 mmol/L (ref 3.5–5.1)
Sodium: 137 mmol/L (ref 135–145)
Total Bilirubin: 0.6 mg/dL (ref 0.3–1.2)
Total Protein: 5.1 g/dL — ABNORMAL LOW (ref 6.5–8.1)

## 2020-11-19 LAB — CBC WITH DIFFERENTIAL/PLATELET
Abs Immature Granulocytes: 0.05 10*3/uL (ref 0.00–0.07)
Basophils Absolute: 0 10*3/uL (ref 0.0–0.1)
Basophils Relative: 0 %
Eosinophils Absolute: 0.2 10*3/uL (ref 0.0–0.5)
Eosinophils Relative: 2 %
HCT: 24.2 % — ABNORMAL LOW (ref 39.0–52.0)
Hemoglobin: 7.9 g/dL — ABNORMAL LOW (ref 13.0–17.0)
Immature Granulocytes: 1 %
Lymphocytes Relative: 15 %
Lymphs Abs: 1.4 10*3/uL (ref 0.7–4.0)
MCH: 31.6 pg (ref 26.0–34.0)
MCHC: 32.6 g/dL (ref 30.0–36.0)
MCV: 96.8 fL (ref 80.0–100.0)
Monocytes Absolute: 0.9 10*3/uL (ref 0.1–1.0)
Monocytes Relative: 9 %
Neutro Abs: 7 10*3/uL (ref 1.7–7.7)
Neutrophils Relative %: 73 %
Platelets: 250 10*3/uL (ref 150–400)
RBC: 2.5 MIL/uL — ABNORMAL LOW (ref 4.22–5.81)
RDW: 15.8 % — ABNORMAL HIGH (ref 11.5–15.5)
WBC: 9.6 10*3/uL (ref 4.0–10.5)
nRBC: 0 % (ref 0.0–0.2)

## 2020-11-19 MED ORDER — POTASSIUM CHLORIDE 20 MEQ PO PACK
20.0000 meq | PACK | Freq: Once | ORAL | Status: AC
  Administered 2020-11-19: 20 meq via ORAL
  Filled 2020-11-19: qty 1

## 2020-11-19 MED ORDER — HYDROCODONE-ACETAMINOPHEN 5-325 MG PO TABS
1.0000 | ORAL_TABLET | Freq: Three times a day (TID) | ORAL | Status: DC | PRN
  Filled 2020-11-19: qty 1

## 2020-11-19 NOTE — Progress Notes (Signed)
    Subjective: Patient reports minimal pain in the hip  Objective:   VITALS:   Vitals:   11/18/20 0900 11/18/20 1220 11/18/20 2017 11/19/20 0607  BP:  (!) 100/55 (!) 110/53 (!) 122/56  Pulse:  66 63 68  Resp:  '18 16 16  '$ Temp:  99 F (37.2 C) 99 F (37.2 C) 97.8 F (36.6 C)  TempSrc:  Oral Oral Oral  SpO2:  94% 96% 97%  Weight:      Height: 6' (1.829 m)      CBC Latest Ref Rng & Units 11/19/2020 11/17/2020 11/16/2020  WBC 4.0 - 10.5 K/uL 9.6 10.4 10.1  Hemoglobin 13.0 - 17.0 g/dL 7.9(L) 8.2(L) 8.1(L)  Hematocrit 39.0 - 52.0 % 24.2(L) 25.7(L) 25.3(L)  Platelets 150 - 400 K/uL 250 224 222   BMP Latest Ref Rng & Units 11/19/2020 11/15/2020 11/14/2020  Glucose 70 - 99 mg/dL 110(H) 113(H) 127(H)  BUN 8 - 23 mg/dL '22 17 18  '$ Creatinine 0.61 - 1.24 mg/dL 1.36(H) 1.21 1.35(H)  BUN/Creat Ratio 10 - 24 - - -  Sodium 135 - 145 mmol/L 137 136 136  Potassium 3.5 - 5.1 mmol/L 3.8 3.6 4.0  Chloride 98 - 111 mmol/L 104 103 104  CO2 22 - 32 mmol/L '27 25 25  '$ Calcium 8.9 - 10.3 mg/dL 8.1(L) 8.5(L) 8.2(L)   Intake/Output      09/02 0701 09/03 0700 09/03 0701 09/04 0700   P.O. 240 230   Total Intake(mL/kg) 240 (3.4) 230 (3.2)   Urine (mL/kg/hr)  100 (0.1)   Stool     Total Output  100   Net +240 +130        Urine Occurrence 3 x 1 x      Physical Exam:  MSK Neurovascularly intact Sensation intact distally Intact pulses distally Dorsiflexion/Plantar flexion intact Incision: dressing C/D/I    Assessment:    S/P Right hip hemiarthroplasty by Dr. Ernesta Amble. Percell Miller on 11/13/20  Principal Problem:   Femur fracture, right ([facility]) Active Problems:   S/P hip hemiarthroplasty   Pressure injury of skin   Closed fracture of right hip requiring operative repair, sequela   Plan: Posterior hip precautions Advance diet Up with therapy Elevate leg and Apply ice  Weightbearing: WBAT RLE Insicional and dressing care: Dressings left intact until follow-up and Reinforce dressings as  needed Showering: POD 3, Keep dressing dry VTE prophylaxis: Lovenox '40mg'$  qd  while inpatient , can switch to ASA '81mg'$  bid x 30 days upon d/c, SCDs, ambulation Pain control: Continue current regimen. Not having much pain Follow - up plan: 2 weeks post-op in the office Contact information:  Edmonia Lynch MD, [address] PA-C  Ethelda Chick, [place] Office [phone] 11/19/2020, 6:38 PM

## 2020-11-19 NOTE — Progress Notes (Signed)
Bilateral lower extremity venous duplex completed. Refer to "CV Proc" under chart review to view preliminary results.  11/19/2020 2:17 PM Kelby Aline., MHA, RVT, RDCS, RDMS

## 2020-11-19 NOTE — Progress Notes (Signed)
Occupational Therapy Session Note  Patient Details  Name: WRAY TELANDER MRN: [id number] Date of Birth: 1928/06/09  Today's Date: 11/19/2020 45 minutes missed   Skilled Therapeutic Interventions/Progress Updates:    Treatment time missed due to improper setup of PPE sanitizing area/safety at time of scheduled session.  Therapy Documentation Precautions:  Precautions Precautions: Posterior Hip, Fall Precaution Comments: verbally reviewed Required Braces or Orthoses: Knee Immobilizer - Right Knee Immobilizer - Right:  (in bed only, off with therapies) Restrictions Weight Bearing Restrictions: No RLE Weight Bearing: Weight bearing as tolerated Other Position/Activity Restrictions: posterior hip precautions R LE  ADL: ADL Eating: Set up Grooming: Setup Upper Body Bathing: Supervision/safety Where Assessed-Upper Body Bathing: Edge of bed Lower Body Bathing: Maximal assistance Where Assessed-Lower Body Bathing: Edge of bed Upper Body Dressing: Supervision/safety Where Assessed-Upper Body Dressing: Edge of bed Lower Body Dressing: Dependent Where Assessed-Lower Body Dressing: Edge of bed Toileting: Dependent Where Assessed-Toileting: Glass blower/designer: Dependent Armed forces technical officer Method: Other (comment) (stedy) Toilet Transfer Equipment: Raised toilet seat      Therapy/Group: Individual Therapy  Murrell Elizondo A Kiyona Mcnall 11/19/2020, 5:13 PM

## 2020-11-19 NOTE — Plan of Care (Signed)
  Problem: RH Balance Goal: LTG Patient will maintain dynamic sitting balance (PT) Description: LTG:  Patient will maintain dynamic sitting balance with assistance during mobility activities (PT) Flowsheets (Taken 11/19/2020 0733) LTG: Pt will maintain dynamic sitting balance during mobility activities with:: Independent with assistive device  Goal: LTG Patient will maintain dynamic standing balance (PT) Description: LTG:  Patient will maintain dynamic standing balance with assistance during mobility activities (PT) Flowsheets (Taken 11/19/2020 0733) LTG: Pt will maintain dynamic standing balance during mobility activities with:: Minimal Assistance - Patient > 75%   Problem: Sit to Stand Goal: LTG:  Patient will perform sit to stand with assistance level (PT) Description: LTG:  Patient will perform sit to stand with assistance level (PT) Flowsheets (Taken 11/19/2020 0733) LTG: PT will perform sit to stand in preparation for functional mobility with assistance level: Minimal Assistance - Patient > 75%   Problem: RH Bed Mobility Goal: LTG Patient will perform bed mobility with assist (PT) Description: LTG: Patient will perform bed mobility with assistance, with/without cues (PT). Flowsheets (Taken 11/19/2020 0733) LTG: Pt will perform bed mobility with assistance level of: Minimal Assistance - Patient > 75%   Problem: RH Bed to Chair Transfers Goal: LTG Patient will perform bed/chair transfers w/assist (PT) Description: LTG: Patient will perform bed to chair transfers with assistance (PT). Flowsheets (Taken 11/19/2020 0733) LTG: Pt will perform Bed to Chair Transfers with assistance level: Minimal Assistance - Patient > 75%   Problem: RH Car Transfers Goal: LTG Patient will perform car transfers with assist (PT) Description: LTG: Patient will perform car transfers with assistance (PT). Flowsheets (Taken 11/19/2020 0733) LTG: Pt will perform car transfers with assist:: Minimal Assistance - Patient >  75%   Problem: RH Ambulation Goal: LTG Patient will ambulate in controlled environment (PT) Description: LTG: Patient will ambulate in a controlled environment, # of feet with assistance (PT). Flowsheets (Taken 11/19/2020 0733) LTG: Pt will ambulate in controlled environ  assist needed:: Minimal Assistance - Patient > 75% LTG: Ambulation distance in controlled environment: 76f with LRAD Goal: LTG Patient will ambulate in home environment (PT) Description: LTG: Patient will ambulate in home environment, # of feet with assistance (PT). Flowsheets (Taken 11/19/2020 0733) LTG: Pt will ambulate in home environ  assist needed:: Minimal Assistance - Patient > 75% LTG: Ambulation distance in home environment: 318fwith LRAD   Problem: RH Wheelchair Mobility Goal: LTG Patient will propel w/c in controlled environment (PT) Description: LTG: Patient will propel wheelchair in controlled environment, # of feet with assist (PT) Flowsheets (Taken 11/19/2020 0733) LTG: Pt will propel w/c in controlled environ  assist needed:: Supervision/Verbal cueing LTG: Propel w/c distance in controlled environment: [id number]: LTG Patient will propel w/c in home environment (PT) Description: LTG: Patient will propel wheelchair in home environment, # of feet with assistance (PT). Flowsheets (Taken 11/19/2020 0733) LTG: Pt will propel w/c in home environ  assist needed:: Supervision/Verbal cueing LTG: Propel w/c distance in home environment: [id number]: RH Stairs Goal: LTG Patient will ambulate up and down stairs w/assist (PT) Description: LTG: Patient will ambulate up and down # of stairs with assistance (PT) Flowsheets (Taken 11/19/2020 0733) LTG: Pt will ambulate up/down stairs assist needed:: Minimal Assistance - Patient > 75% LTG: Pt will  ambulate up and down number of stairs: 1 step with RW to access home

## 2020-11-19 NOTE — Progress Notes (Signed)
PROGRESS NOTE   Subjective/Complaints: No complaints this morning Discussed with nursing- he is doing well, continues to have congestion with phlegm production, alert and oriented  ROS: +cough  Objective:   DG Chest Port 1V same Day  Result Date: 11/17/2020 CLINICAL DATA:  87 year old male with history of cough. EXAM: PORTABLE CHEST 1 VIEW COMPARISON:  Chest x-ray 11/15/2020. FINDINGS: Lung volumes are low. No consolidative airspace disease. No pleural effusions. No pneumothorax. No pulmonary nodule or mass noted. Very large hiatal hernia. Pulmonary vasculature and the cardiomediastinal silhouette are otherwise within normal limits. Atherosclerosis in the thoracic aorta. Status post median sternotomy for CABG. IMPRESSION: 1. Low lung volumes without radiographic evidence of acute cardiopulmonary disease. 2. Very large hiatal hernia again noted. 3. Aortic atherosclerosis. Electronically Signed   By: Vinnie Langton M.D.   On: 11/17/2020 21:18   Recent Labs    11/17/20 0116 11/19/20 0501  WBC 10.4 9.6  HGB 8.2* 7.9*  HCT 25.7* 24.2*  PLT 224 250   Recent Labs    11/19/20 0501  NA 137  K 3.8  CL 104  CO2 27  GLUCOSE 110*  BUN 22  CREATININE 1.36*  CALCIUM 8.1*    Intake/Output Summary (Last 24 hours) at 11/19/2020 1331 Last data filed at 11/19/2020 0900 Gross per 24 hour  Intake --  Output 100 ml  Net -100 ml     Pressure Injury 11/17/20 Sacrum Mid Stage 2 -  Partial thickness loss of dermis presenting as a shallow open injury with a red, pink wound bed without slough. round red shallow crater surrounded by denuded area with serous drainage (Active)  11/17/20 0315  Location: Sacrum  Location Orientation: Mid  Staging: Stage 2 -  Partial thickness loss of dermis presenting as a shallow open injury with a red, pink wound bed without slough.  Wound Description (Comments): round red shallow crater surrounded by denuded  area with serous drainage  Present on Admission: Yes    Physical Exam: Vital Signs Blood pressure (!) 122/56, pulse 68, temperature 97.8 F (36.6 C), temperature source Oral, resp. rate 16, height 6' (1.829 m), weight 70.8 kg, SpO2 97 %. Gen: no distress, normal appearing HEENT: oral mucosa pink and moist, NCAT Cardio: Reg rate Pulmonary:     Comments: Occasional congested cough noted. Decreased BS at bases. Also has a productive cough of frothy white sputum- a few rhonchi- cleared, but constantly clearing throat.  Abdominal:     Comments: Slightly distended; soft, hypoactive bs; NT  Genitourinary:    Comments: Condom catheter- medium amber urine in bag Musculoskeletal:     Cervical back: Normal range of motion. No rigidity.     Comments: Right hip incision covered with steri-strips.  Moderate edema, no erythema seen.  UE 5-/5 B/L RLE- HF 3/5- in brace from mid thigh to R ankle- cannot test- knee; DF/PF 4-/5 LLE- 4-/5 Skin:    Comments: Bilateral forearms with multiple ecchymotic areas.  L forearm iv- looks ok- but pulling at skin Peeling skin on top of head with actinic keratosis.   Neurological:     Mental Status: He is alert and oriented to person, place, and time.  Comments: Decreased hearing. Speech clear. Able to follow simple commands without difficulty. Able to state age but had difficulty recalling month and day "Wed".  Did remember had an EGD lately for blood Intact to light touch x 4  Psychiatric:     Comments: Interactive; HOH    Assessment/Plan: 1. Functional deficits which require 3+ hours per day of interdisciplinary therapy in a comprehensive inpatient rehab setting. Physiatrist is providing close team supervision and 24 hour management of active medical problems listed below. Physiatrist and rehab team continue to assess barriers to discharge/monitor patient progress toward functional and medical goals  Care Tool:  Bathing    Body parts bathed by  patient: Right arm, Left arm, Chest, Abdomen, Front perineal area, Face   Body parts bathed by helper: Buttocks, Right upper leg, Left upper leg, Right lower leg, Left lower leg     Bathing assist Assist Level: Moderate Assistance - Patient 50 - 74%     Upper Body Dressing/Undressing Upper body dressing   What is the patient wearing?: Pull over shirt    Upper body assist Assist Level: Set up assist    Lower Body Dressing/Undressing Lower body dressing      What is the patient wearing?: Incontinence brief, Pants     Lower body assist Assist for lower body dressing: Maximal Assistance - Patient 25 - 49%     Toileting Toileting    Toileting assist Assist for toileting: Total Assistance - Patient < 25%     Transfers Chair/bed transfer  Transfers assist     Chair/bed transfer assist level: Maximal Assistance - Patient 25 - 49%     Locomotion Ambulation   Ambulation assist   Ambulation activity did not occur: Safety/medical concerns          Walk 10 feet activity   Assist  Walk 10 feet activity did not occur: Safety/medical concerns        Walk 50 feet activity   Assist Walk 50 feet with 2 turns activity did not occur: Safety/medical concerns         Walk 150 feet activity   Assist Walk 150 feet activity did not occur: Safety/medical concerns         Walk 10 feet on uneven surface  activity   Assist Walk 10 feet on uneven surfaces activity did not occur: Safety/medical concerns         Wheelchair     Assist Is the patient using a wheelchair?: Yes Type of Wheelchair: Manual Wheelchair activity did not occur: Refused         Wheelchair 50 feet with 2 turns activity    Assist    Wheelchair 50 feet with 2 turns activity did not occur: Refused       Wheelchair 150 feet activity     Assist  Wheelchair 150 feet activity did not occur: Refused       Blood pressure (!) 122/56, pulse 68, temperature 97.8 F  (36.6 C), temperature source Oral, resp. rate 16, height 6' (1.829 m), weight 70.8 kg, SpO2 97 %.  Medical Problem List and Plan: 1.  R hip fx s/p hemiarthroplasty secondary to multiple falls             -patient may  shower if covered       -ELOS/Goals: 16-18 days- min Assist Decrease therapy to QD 2.  Antithrombotics: -DVT/anticoagulation:  Pharmaceutical: Lovenox             -antiplatelet therapy: On ASA  bid for DVT prophylaxis?-->decrease to once a day as on lovenox.  3. Pain: Decrease Norco to q8H prn for severe pain, tylenol prn.  4. Mood: LCSW to follow for evaluation and  support.              -antipsychotic agents: N/A 5. Neuropsych: This patient is not fully capable of making decisions on his own behalf. 6. Skin/Wound Care: Monitor wound for healing.  7. Fluids/Electrolytes/Nutrition: Monitor I/O. Check lytes in am. 8. Cough: New due to question aspiration event--Has been on Mucinex since 08/30-->triggered Covid precautions by nurse             --ID contacted for input/clarity             --will add supervision with meals and reflux precautions (large [place])  CXR reviewed and stable 9. GERD: Continue Protonix BID 10. Parkinson's disease: Managed with Sinemet IR TID 11. CAD s/p CABG: Monitor for symptoms with increase in activity. Continue ASA.              --hold lasix due to intermittent low BP.  12. Acute on chronic anemia due to blood loss anemia: Hgb reviewed 9/2- decreased to 8.2, repeat Monday             --resume iron supplement.  13. Constipation: Will add Senna S 14. Hypotension: medications reviewed, not on any medications for hypertension.  15. Hypokalemia: 68mq K+ supplement today    LOS: 2 days A FACE TO FACE EVALUATION WAS PERFORMED  KMartha ClanP Shawnda Mauney 11/19/2020, 1:31 PM

## 2020-11-19 NOTE — Progress Notes (Addendum)
Occupational Therapy Session Note  Patient Details  Name: Jon Reid MRN: [id number] Date of Birth: 1929/01/06  Today's Date: 11/19/2020 OT Individual Time: 0600-4599 OT Individual Time Calculation (min): 45 min    Short Term Goals: Week 1:  OT Short Term Goal 1 (Week 1): Pt will sit to stand from EOB to RW with min A. OT Short Term Goal 2 (Week 1): Pt will be able to stand at [facility] with S while therapist A him with managing clothing over hips. OT Short Term Goal 3 (Week 1): Pt will be able to don pants over feet with reacher with min A. OT Short Term Goal 4 (Week 1): Pt will be able to self cleanse post toileting with mod A.  Skilled Therapeutic Interventions/Progress Updates:     Pt received in bed with no pain reported just soreness ADL:  Pt completes bathing with set up for UB and MAX A for LB d/t posterior hip precautions and decreased force for power up to wash buttocks.  Pt completes UB dressing with VC for doffing strategy and S for donning overhead shirt Pt completes LB dressing with MAX A for threading RLE and pulling past hips STS. MAX A for facilitation to upright and pt able to state put the R leg in first Pt completes footwear with total A Pt completes toileting with MAX A for standing urinal void. Mirror used for visual feedback  Pt left at end of session in TIS with exit alarm on, call light in reach and all needs met   Therapy Documentation Precautions:  Precautions Precautions: Posterior Hip, Fall Precaution Comments: verbally reviewed Required Braces or Orthoses: Knee Immobilizer - Right Knee Immobilizer - Right:  (in bed only, off with therapies) Restrictions Weight Bearing Restrictions: No RLE Weight Bearing: Weight bearing as tolerated Other Position/Activity Restrictions: posterior hip precautions R LE General:   Vital Signs: Therapy Vitals Temp: 97.8 F (36.6 C) Temp Source: Oral Pulse Rate: 68 Resp: 16 BP: (!) 122/56 Patient Position (if  appropriate): Lying Oxygen Therapy SpO2: 97 % O2 Device: Room Air   Therapy/Group: Individual Therapy  Tonny Branch 11/19/2020, 6:52 AM

## 2020-11-20 NOTE — IPOC Note (Signed)
Overall Plan of Care [facility]) Patient Details Name: YHAIR POURCIAU MRN: [id number] DOB: November 04, 1928  Admitting Diagnosis: Femur fracture, right [facility])  Hospital Problems: Principal Problem:   Femur fracture, right ([place]) Active Problems:   S/P hip hemiarthroplasty   Pressure injury of skin   Closed fracture of right hip requiring operative repair, sequela     Functional Problem List: Nursing (P) Bowel, Edema, Safety, Medication Management, Pain, Endurance, Skin Integrity  PT Balance, Behavior, Endurance, Motor, Safety, Skin Integrity  OT Balance, Cognition, Endurance, Motor, Pain  SLP    TR         Basic ADL's: OT Grooming, Bathing, Dressing, Toileting     Advanced  ADL's: OT       Transfers: PT Bed Mobility, Bed to Chair, Car, [employer], Futures trader, Metallurgist: PT Ambulation, Emergency planning/management officer, Stairs     Additional Impairments: OT None  SLP        TR      Anticipated Outcomes Item Anticipated Outcome  Self Feeding independent  Swallowing      Basic self-care  supervision  Toileting  supervision   Bathroom Transfers supervision  Bowel/Bladder     Transfers  Min assist with LRAD  Locomotion  Ambulatory with min assist at household distances.  Communication     Cognition     Pain     Safety/Judgment      Therapy Plan: PT Intensity: Minimum of 1-2 x/day ,45 to 90 minutes PT Frequency: 5 out of 7 days PT Duration Estimated Length of Stay: 12-14 days OT Intensity: Minimum of 1-2 x/day, 45 to 90 minutes OT Frequency:  (QD schedule at this time) OT Duration/Estimated Length of Stay: 12-14 days     Due to the current state of emergency, patients may not be receiving their 3-hours of Medicare-mandated therapy.   Team Interventions: Nursing Interventions    PT interventions Ambulation/gait training, Community reintegration, DME/adaptive equipment instruction, Psychosocial support, IT trainer, UE/LE Strength taining/ROM,  Wheelchair propulsion/positioning, Neuromuscular re-education, Training and development officer, Discharge planning, Pain management, Skin care/wound management, Therapeutic Activities, UE/LE Coordination activities, Visual/perceptual remediation/compensation, Therapeutic Exercise, Splinting/orthotics, Patient/family education, Functional mobility training, Disease management/prevention, Cognitive remediation/compensation  OT Interventions Training and development officer, Discharge planning, Functional mobility training, DME/adaptive equipment instruction, Patient/family education, Therapeutic Activities, Self Care/advanced ADL retraining, Therapeutic Exercise, UE/LE Strength taining/ROM, Pain management, Psychosocial support  SLP Interventions    TR Interventions    SW/CM Interventions Discharge Planning, Psychosocial Support, Patient/Family Education   Barriers to Discharge MD  Medical stability  Nursing      PT Decreased caregiver support, Home environment Child psychotherapist, Insurance for SNF coverage, Medication compliance    OT      SLP      SW Decreased caregiver support     Team Discharge Planning: Destination: PT-Home ,OT- Home , SLP-  Projected Follow-up: PT-Home health PT, OT-  Home health OT, SLP-  Projected Equipment Needs: PT-Wheelchair (measurements), Wheelchair cushion (measurements), Rolling walker with 5" wheels, To be determined, OT- 3 in 1 bedside comode, To be determined, SLP-  Equipment Details: PT- , OT-  Patient/family involved in discharge planning: PT- Patient,  OT-Patient, SLP-   MD ELOS: 16-18 days Medical Rehab Prognosis:  Excellent Assessment: Mr. Mcintyre is a 87 year old man who is admitted to CIR with  R hip fx s/p hemiarthroplasty secondary to multiple falls. Course complicated by [id number] infection with respiratory symptoms, hypokalemia, hypotension, constipation. Medications are being managed, and labs and vitals are being  monitored regularly.      See Team  Conference Notes for weekly updates to the plan of care

## 2020-11-20 NOTE — Progress Notes (Signed)
Physical Therapy Session Note  Patient Details  Name: Jon Reid MRN: [id number] Date of Birth: 04/30/1928  Today's Date: 11/20/2020 PT Individual Time: 1100-1155 PT Individual Time Calculation (min): 55 min   Short Term Goals: Week 1:  PT Short Term Goal 1 (Week 1): Pt will perform bed mobility with mod assist PT Short Term Goal 2 (Week 1): Pt will ambulate 24f with mod assist PT Short Term Goal 3 (Week 1): Pt will propell WC with supervision assist around room PT Short Term Goal 4 (Week 1): Pt will consistently transfer with mod assist to and from Bed  Skilled Therapeutic Interventions/Progress Updates:    Pt received supine in bed, agreeable to PT session. No complaints of pain at rest, has onset of R hip pain with mobility that improves at rest. Assisted pt with donning pants at bed level, max A. Supine to sit with max A for LE management and trunk elevation. Sit to stand with max A to RW throughout session. Ambulation x 20 ft, x 40 ft, x 20 ft with RW and mod A in patient room, seated rest breaks between each bout of ambulation. Pt exhibits antalgic gait pattern, decreased tolerance for stance on RLE, decreased step length with LLE. Pt reports onset of dizziness during gait, returned to sitting. Seated BP 104/49 and symptoms improve in seated position. Education with patient regarding BP drop in standing due to decreased upright tolerance from being bed-bound. Pt understanding of education and importance of therapy to improve endurance and tolerance. Pt agreeable to remain seated in TIS chair at end of session, needs in reach, quick release belt and chair alarm in place. Pt also with frequent coughing (productive cough) throughout session and frequent coughing up of sputum.  Therapy Documentation Precautions:  Precautions Precautions: Posterior Hip, Fall Precaution Comments: verbally reviewed Required Braces or Orthoses: Knee Immobilizer - Right Knee Immobilizer - Right:  (in bed  only, off with therapies) Restrictions Weight Bearing Restrictions: No RLE Weight Bearing: Weight bearing as tolerated Other Position/Activity Restrictions: posterior hip precautions R LE      Therapy/Group: Individual Therapy   TExcell Seltzer PT, DPT, CSRS  11/20/2020, 12:23 PM

## 2020-11-20 NOTE — Progress Notes (Deleted)
Overall Plan of Care [facility]) Patient Details Name: Jon Reid MRN: [id number] DOB: 04/08/1928  Admitting Diagnosis: Femur fracture, right [facility])  Hospital Problems: Principal Problem:   Femur fracture, right ([place]) Active Problems:   S/P hip hemiarthroplasty   Pressure injury of skin   Closed fracture of right hip requiring operative repair, sequela     Functional Problem List: Nursing (P) Bowel, Edema, Safety, Medication Management, Pain, Endurance, Skin Integrity  PT Balance, Behavior, Endurance, Motor, Safety, Skin Integrity  OT Balance, Cognition, Endurance, Motor, Pain  SLP    TR         Basic ADL's: OT Grooming, Bathing, Dressing, Toileting     Advanced  ADL's: OT       Transfers: PT Bed Mobility, Bed to Chair, Car, [employer], Futures trader, Metallurgist: PT Ambulation, Emergency planning/management officer, Stairs     Additional Impairments: OT None  SLP        TR      Anticipated Outcomes Item Anticipated Outcome  Self Feeding independent  Swallowing      Basic self-care  supervision  Toileting  supervision   Bathroom Transfers supervision  Bowel/Bladder     Transfers  Min assist with LRAD  Locomotion  Ambulatory with min assist at household distances.  Communication     Cognition     Pain     Safety/Judgment      Therapy Plan: PT Intensity: Minimum of 1-2 x/day ,45 to 90 minutes PT Frequency: 5 out of 7 days PT Duration Estimated Length of Stay: 12-14 days OT Intensity: Minimum of 1-2 x/day, 45 to 90 minutes OT Frequency:  (QD schedule at this time) OT Duration/Estimated Length of Stay: 12-14 days     Due to the current state of emergency, patients may not be receiving their 3-hours of Medicare-mandated therapy.   Team Interventions: Nursing Interventions    PT interventions Ambulation/gait training, Community reintegration, DME/adaptive equipment instruction, Psychosocial support, IT trainer, UE/LE Strength taining/ROM,  Wheelchair propulsion/positioning, Neuromuscular re-education, Training and development officer, Discharge planning, Pain management, Skin care/wound management, Therapeutic Activities, UE/LE Coordination activities, Visual/perceptual remediation/compensation, Therapeutic Exercise, Splinting/orthotics, Patient/family education, Functional mobility training, Disease management/prevention, Cognitive remediation/compensation  OT Interventions Training and development officer, Discharge planning, Functional mobility training, DME/adaptive equipment instruction, Patient/family education, Therapeutic Activities, Self Care/advanced ADL retraining, Therapeutic Exercise, UE/LE Strength taining/ROM, Pain management, Psychosocial support  SLP Interventions    TR Interventions    SW/CM Interventions Discharge Planning, Psychosocial Support, Patient/Family Education   Barriers to Discharge MD  Medical stability  Nursing      PT Decreased caregiver support, Home environment Child psychotherapist, Insurance for SNF coverage, Medication compliance    OT      SLP      SW Decreased caregiver support     Team Discharge Planning: Destination: PT-Home ,OT- Home , SLP-  Projected Follow-up: PT-Home health PT, OT-  Home health OT, SLP-  Projected Equipment Needs: PT-Wheelchair (measurements), Wheelchair cushion (measurements), Rolling walker with 5" wheels, To be determined, OT- 3 in 1 bedside comode, To be determined, SLP-  Equipment Details: PT- , OT-  Patient/family involved in discharge planning: PT- Patient,  OT-Patient, SLP-   MD ELOS: 16-18 days [facility] Medical Rehab Prognosis:  Excellent Assessment: Jon Reid is a 87 year old man who is admitted to CIR with  R hip fx s/p hemiarthroplasty secondary to multiple falls. Course complicated by [id number] infection with respiratory symptoms, hypokalemia, hypotension, constipation. Medications are being managed, and labs and vitals are  being monitored regularly.     See Team  Conference Notes for weekly updates to the plan of care

## 2020-11-21 DIAGNOSIS — S72001D Fracture of unspecified part of neck of right femur, subsequent encounter for closed fracture with routine healing: Principal | ICD-10-CM

## 2020-11-21 LAB — CBC
HCT: 24.5 % — ABNORMAL LOW (ref 39.0–52.0)
Hemoglobin: 8 g/dL — ABNORMAL LOW (ref 13.0–17.0)
MCH: 31.7 pg (ref 26.0–34.0)
MCHC: 32.7 g/dL (ref 30.0–36.0)
MCV: 97.2 fL (ref 80.0–100.0)
Platelets: 318 10*3/uL (ref 150–400)
RBC: 2.52 MIL/uL — ABNORMAL LOW (ref 4.22–5.81)
RDW: 15.7 % — ABNORMAL HIGH (ref 11.5–15.5)
WBC: 7 10*3/uL (ref 4.0–10.5)
nRBC: 0 % (ref 0.0–0.2)

## 2020-11-21 LAB — BASIC METABOLIC PANEL
Anion gap: 10 (ref 5–15)
BUN: 18 mg/dL (ref 8–23)
CO2: 24 mmol/L (ref 22–32)
Calcium: 8.6 mg/dL — ABNORMAL LOW (ref 8.9–10.3)
Chloride: 103 mmol/L (ref 98–111)
Creatinine, Ser: 1.23 mg/dL (ref 0.61–1.24)
GFR, Estimated: 56 mL/min — ABNORMAL LOW (ref >60)
Glucose, Bld: 109 mg/dL — ABNORMAL HIGH (ref 70–99)
Potassium: 4.4 mmol/L (ref 3.5–5.1)
Sodium: 137 mmol/L (ref 135–145)

## 2020-11-21 NOTE — Progress Notes (Signed)
PROGRESS NOTE   Subjective/Complaints:  No SOB, still producing yellow sputum, trying to use IS , no sweats or chills Right hip hurts only with movement   ROS: +cough  Objective:   VAS [name] LOWER EXTREMITY VENOUS (DVT)  Result Date: 11/19/2020  Lower Venous DVT Study Patient Name:  Jon Reid  Date of Exam:   11/19/2020 Medical Rec #: [id number]       Accession #:    [id number] Date of Birth: 07-08-1928       Patient Gender: M Patient Age:   87 years Exam Location:  [facility] Procedure:      VAS [name] LOWER EXTREMITY VENOUS (DVT) Referring Phys: PAMELA LOVE --------------------------------------------------------------------------------  Indications: Fever. COVID positive.  Comparison Study: No prior study Performing Technologist: Maudry Mayhew MHA, RDMS, RVT, RDCS  Examination Guidelines: A complete evaluation includes B-mode imaging, spectral Doppler, color Doppler, and power Doppler as needed of all accessible portions of each vessel. Bilateral testing is considered an integral part of a complete examination. Limited examinations for reoccurring indications may be performed as noted. The reflux portion of the exam is performed with the patient in reverse Trendelenburg.  +---------+---------------+---------+-----------+----------+--------------+ RIGHT    CompressibilityPhasicitySpontaneityPropertiesThrombus Aging +---------+---------------+---------+-----------+----------+--------------+ CFV      Full           Yes      Yes                                 +---------+---------------+---------+-----------+----------+--------------+ SFJ      Full                                                        +---------+---------------+---------+-----------+----------+--------------+ FV Prox  Full                                                         +---------+---------------+---------+-----------+----------+--------------+ FV Mid   Full                                                        +---------+---------------+---------+-----------+----------+--------------+ FV DistalFull                                                        +---------+---------------+---------+-----------+----------+--------------+ PFV      Full                                                        +---------+---------------+---------+-----------+----------+--------------+  POP      Full           Yes      Yes                                 +---------+---------------+---------+-----------+----------+--------------+ PTV      Full                                                        +---------+---------------+---------+-----------+----------+--------------+ PERO     Full                                                        +---------+---------------+---------+-----------+----------+--------------+   +---------+---------------+---------+-----------+----------+--------------+ LEFT     CompressibilityPhasicitySpontaneityPropertiesThrombus Aging +---------+---------------+---------+-----------+----------+--------------+ CFV      Full           Yes      Yes                                 +---------+---------------+---------+-----------+----------+--------------+ SFJ      Full                                                        +---------+---------------+---------+-----------+----------+--------------+ FV Prox  Full                                                        +---------+---------------+---------+-----------+----------+--------------+ FV Mid   Full                                                        +---------+---------------+---------+-----------+----------+--------------+ FV DistalFull                                                         +---------+---------------+---------+-----------+----------+--------------+ PFV      Full                                                        +---------+---------------+---------+-----------+----------+--------------+ POP      Full           Yes      Yes                                 +---------+---------------+---------+-----------+----------+--------------+  PTV      Full                                                        +---------+---------------+---------+-----------+----------+--------------+ PERO     Full                                                        +---------+---------------+---------+-----------+----------+--------------+     Summary: BILATERAL: - No evidence of deep vein thrombosis seen in the lower extremities, bilaterally. -No evidence of popliteal cyst, bilaterally.   *See table(s) above for measurements and observations. Electronically signed by Deitra Mayo MD on 11/19/2020 at 3:03:10 PM.    Final    Recent Labs    11/19/20 0501 11/21/20 0535  WBC 9.6 7.0  HGB 7.9* 8.0*  HCT 24.2* 24.5*  PLT 250 318    Recent Labs    11/19/20 0501 11/21/20 0535  NA 137 137  K 3.8 4.4  CL 104 103  CO2 27 24  GLUCOSE 110* 109*  BUN 22 18  CREATININE 1.36* 1.23  CALCIUM 8.1* 8.6*     Intake/Output Summary (Last 24 hours) at 11/21/2020 1056 Last data filed at 11/21/2020 0807 Gross per 24 hour  Intake 460 ml  Output 350 ml  Net 110 ml      Pressure Injury 11/17/20 Sacrum Mid Stage 2 -  Partial thickness loss of dermis presenting as a shallow open injury with a red, pink wound bed without slough. round red shallow crater surrounded by denuded area with serous drainage (Active)  11/17/20 0315  Location: Sacrum  Location Orientation: Mid  Staging: Stage 2 -  Partial thickness loss of dermis presenting as a shallow open injury with a red, pink wound bed without slough.  Wound Description (Comments): round red shallow crater surrounded by  denuded area with serous drainage  Present on Admission: Yes    Physical Exam: Vital Signs Blood pressure 118/88, pulse 72, temperature 98.5 F (36.9 C), temperature source Oral, resp. rate 14, height 6' (1.829 m), weight 70.8 kg, SpO2 93 %.  General: No acute distress Mood and affect are appropriate Heart: Regular rate and rhythm no rubs murmurs or extra sounds Lungs: Clear to auscultation, breathing unlabored, no rales or wheezes Abdomen: Positive bowel sounds, soft nontender to palpation, nondistended Extremities: No clubbing, cyanosis, or edema Skin: No evidence of breakdown, no evidence of rash  Musculoskeletal:     Cervical back: Normal range of motion. No rigidity.     Comments: Right hip incision covered with steri-strips.  Moderate edema, no erythema seen.  UE 5-/5 B/L RLE- HF 3/5- in brace from mid thigh to R ankle- cannot test- knee; DF/PF 4-/5 LLE- 4-/5 Skin:    Comments: Bilateral forearms with multiple ecchymotic areas.  L forearm iv- looks ok- but pulling at skin Peeling skin on top of head with actinic keratosis.   Neurological:     Mental Status: He is alert and oriented to person, place, and time.     Comments: Decreased hearing. Speech clear. Able to follow simple commands without difficulty.  Intact to light touch x 4  Psychiatric:  Comments: Interactive; HOH    Assessment/Plan: 1. Functional deficits which require 3+ hours per day of interdisciplinary therapy in a comprehensive inpatient rehab setting. Physiatrist is providing close team supervision and 24 hour management of active medical problems listed below. Physiatrist and rehab team continue to assess barriers to discharge/monitor patient progress toward functional and medical goals  Care Tool:  Bathing    Body parts bathed by patient: Right arm, Left arm, Chest, Abdomen, Front perineal area, Face   Body parts bathed by helper: Buttocks, Right upper leg, Left upper leg, Right lower leg, Left  lower leg     Bathing assist Assist Level: Moderate Assistance - Patient 50 - 74%     Upper Body Dressing/Undressing Upper body dressing   What is the patient wearing?: Pull over shirt    Upper body assist Assist Level: Set up assist    Lower Body Dressing/Undressing Lower body dressing      What is the patient wearing?: Incontinence brief, Pants     Lower body assist Assist for lower body dressing: Maximal Assistance - Patient 25 - 49%     Toileting Toileting    Toileting assist Assist for toileting: Total Assistance - Patient < 25%     Transfers Chair/bed transfer  Transfers assist     Chair/bed transfer assist level: Maximal Assistance - Patient 25 - 49%     Locomotion Ambulation   Ambulation assist   Ambulation activity did not occur: Safety/medical concerns  Assist level: Moderate Assistance - Patient 50 - 74% Assistive device: Walker-rolling Max distance: 40'   Walk 10 feet activity   Assist  Walk 10 feet activity did not occur: Safety/medical concerns  Assist level: Moderate Assistance - Patient - 50 - 74% Assistive device: Walker-rolling   Walk 50 feet activity   Assist Walk 50 feet with 2 turns activity did not occur: Safety/medical concerns         Walk 150 feet activity   Assist Walk 150 feet activity did not occur: Safety/medical concerns         Walk 10 feet on uneven surface  activity   Assist Walk 10 feet on uneven surfaces activity did not occur: Safety/medical concerns         Wheelchair     Assist Is the patient using a wheelchair?: Yes Type of Wheelchair: Manual Wheelchair activity did not occur: Refused         Wheelchair 50 feet with 2 turns activity    Assist    Wheelchair 50 feet with 2 turns activity did not occur: Refused       Wheelchair 150 feet activity     Assist  Wheelchair 150 feet activity did not occur: Refused       Blood pressure 118/88, pulse 72, temperature  98.5 F (36.9 C), temperature source Oral, resp. rate 14, height 6' (1.829 m), weight 70.8 kg, SpO2 93 %.  Medical Problem List and Plan: 1.  R hip fx s/p hemiarthroplasty secondary to multiple falls             -patient may  shower if covered       -ELOS/Goals: 16-18 days- min Assist Decrease therapy to QD 2.  Antithrombotics: -DVT/anticoagulation:  Pharmaceutical: Lovenox             -antiplatelet therapy: On ASA bid for DVT prophylaxis?-->decrease to once a day as on lovenox.  3. Pain: Decrease Norco to q8H prn for severe pain, tylenol prn.  4. Mood: LCSW  to follow for evaluation and  support.              -antipsychotic agents: N/A 5. Neuropsych: This patient is not fully capable of making decisions on his own behalf. 6. Skin/Wound Care: Monitor wound for healing.  7. Fluids/Electrolytes/Nutrition: Monitor I/O. Check lytes in am. 8. Cough: New due to question aspiration event--Has been on Mucinex since 08/30-->+ Covid 8/26, afeb , WBC nl , CXR 9/1 low vol but unremarkable may d/c isolation, needs to wear mask when out of room  9. GERD: Continue Protonix BID 10. Parkinson's disease: Managed with Sinemet IR TID 11. CAD s/p CABG: Monitor for symptoms with increase in activity. Continue ASA.              --hold lasix due to intermittent low BP.  12. Acute on chronic anemia due to blood loss anemia: Hgb reviewed 9/2- decreased to 8.2, repeat 9/5 stable at 8.0             --resume iron supplement.  13. Constipation: Will add Senna S 14. Hypotension: medications reviewed, not on any medications for hypertension.  Vitals:   11/20/20 1307 11/20/20 2012  BP:  118/88  Pulse:  72  Resp:  14  Temp:  98.5 F (36.9 C)  SpO2: 100% 93%    15. Hypokalemia: 61mq K+ supplement today, K+ normal on 9/5    LOS: 4 days A FACE TO FACE EVALUATION WAS PERFORMED  ACharlett Blake9/07/2020, 10:56 AM

## 2020-11-21 NOTE — Plan of Care (Signed)
  Problem: Consults Goal: RH GENERAL PATIENT EDUCATION Description: See Patient Education module for education specifics. Outcome: Progressing   Problem: RH BOWEL ELIMINATION Goal: RH STG MANAGE BOWEL WITH ASSISTANCE Description: STG Manage Bowel with mod I Assistance. Outcome: Progressing Goal: RH STG MANAGE BOWEL W/MEDICATION W/ASSISTANCE Description: STG Manage Bowel with Medication with  mod I Assistance. Outcome: Progressing   Problem: RH BLADDER ELIMINATION Goal: RH STG MANAGE BLADDER WITH ASSISTANCE Description: STG Manage Bladder With mod I Assistance Outcome: Progressing   Problem: RH SKIN INTEGRITY Goal: RH STG SKIN FREE OF INFECTION/BREAKDOWN Description: W min assist Outcome: Progressing   Problem: RH SAFETY Goal: RH STG ADHERE TO SAFETY PRECAUTIONS W/ASSISTANCE/DEVICE Description: STG Adhere to Safety Precautions With cues Assistance/Device. Outcome: Progressing   Problem: RH PAIN MANAGEMENT Goal: RH STG PAIN MANAGED AT OR BELOW PT'S PAIN GOAL Description: At or below level 4 Outcome: Progressing   Problem: RH KNOWLEDGE DEFICIT GENERAL Goal: RH STG INCREASE KNOWLEDGE OF SELF CARE AFTER HOSPITALIZATION Description: Patient's wife and sons will be able to manage care at discharge using handouts and educational resources independently Outcome: Progressing

## 2020-11-21 NOTE — Progress Notes (Signed)
Occupational Therapy Session Note  Patient Details  Name: Jon Reid MRN: [id number] Date of Birth: 20-Apr-1928  Today's Date: 11/21/2020 OT Individual Time: 1450-1532 OT Individual Time Calculation (min): 42 min    Short Term Goals: Week 1:  OT Short Term Goal 1 (Week 1): Pt will sit to stand from EOB to RW with min A. OT Short Term Goal 2 (Week 1): Pt will be able to stand at [facility] with S while therapist A him with managing clothing over hips. OT Short Term Goal 3 (Week 1): Pt will be able to don pants over feet with reacher with min A. OT Short Term Goal 4 (Week 1): Pt will be able to self cleanse post toileting with mod A.  Skilled Therapeutic Interventions/Progress Updates:    Pt greeted in the TIS, agreeable to session. To work on increasing independence with LB dressing, initiated AE training. Educated pt on using reacher to doff socks and to thread LEs into pants, also reviewed sock aide to don gripper socks. Pt required Mod-Max A to use all equipment successfully (pants simulated using theraband). He also required increased time and instructions repeated due to [facility]. At end of tx pt was reclined in the TIS, left with all needs within reach and safety belt fastened.   Pt able to recall 1/3 hip precautions during tx  Therapy Documentation Precautions:  Precautions Precautions: Posterior Hip, Fall Precaution Comments: verbally reviewed Required Braces or Orthoses: Knee Immobilizer - Right Knee Immobilizer - Right:  (in bed only, off with therapies) Restrictions Weight Bearing Restrictions: No RLE Weight Bearing: Weight bearing as tolerated Other Position/Activity Restrictions: posterior hip precautions R LE  Vital Signs: Therapy Vitals Temp: 98.2 F (36.8 C) Temp Source: Oral Pulse Rate: 62 Resp: 17 BP: (!) 116/59 Patient Position (if appropriate): Sitting Oxygen Therapy SpO2: 99 % O2 Device: Room Air Pain: pt denied pain during tx   ADL: ADL Eating: Set  up Grooming: Setup Upper Body Bathing: Supervision/safety Where Assessed-Upper Body Bathing: Edge of bed Lower Body Bathing: Maximal assistance Where Assessed-Lower Body Bathing: Edge of bed Upper Body Dressing: Supervision/safety Where Assessed-Upper Body Dressing: Edge of bed Lower Body Dressing: Dependent Where Assessed-Lower Body Dressing: Edge of bed Toileting: Dependent Where Assessed-Toileting: Glass blower/designer: Dependent Armed forces technical officer Method: Other (comment) (stedy) Toilet Transfer Equipment: Raised toilet seat     Therapy/Group: Individual Therapy  Novaleigh Kohlman A Jerolyn Flenniken 11/21/2020, 3:50 PM

## 2020-11-21 NOTE — Progress Notes (Signed)
Physical Therapy Session Note  Patient Details  Name: Jon Reid MRN: [id number] Date of Birth: [birth year]  Today's Date: 11/21/2020 PT Individual Time: 1115-1201 PT Individual Time Calculation (min): 46 min   Short Term Goals: Week 1:  PT Short Term Goal 1 (Week 1): Pt will perform bed mobility with mod assist PT Short Term Goal 2 (Week 1): Pt will ambulate 71f with mod assist PT Short Term Goal 3 (Week 1): Pt will propell WC with supervision assist around room PT Short Term Goal 4 (Week 1): Pt will consistently transfer with mod assist to and from Bed  Skilled Therapeutic Interventions/Progress Updates:     NT notifying therapist that pt is no longer on airborne precautions - clarify with RN and with MD - pt is clear to participate in therapy out of the room while remaining surgical mask. Pt in TWhiteashw/c at start of session and agreeable to therapy, eager to get on his feet and "move around." He denies any pain and he has the [place] R[place] at start - removed for therapy session and reapplied at end of session. Pt wheeled in TNikolaiw/c to main rehab gym. Required w/c adjustment to length leg rests to better fit - pt appreciative. Completed alternating LAQ 2x20 and bilateral arm raises 2x20 for active "warm up." Next, completed repeated sit<>stands with minA to RW, 1x6 total with cues needed for hand placement and setup. Focused remainder of session on functional gait training - ambulated 766f+ 7563fith minA and RW - some mild antalgic gait pattern with step-to pattern, decreased R heel strike, cues for keeping body within walker frame. Appears to be putting ~25-40% weightbearing on RLE. Pt returned to his room in w/c, remained seated with all needs in reach, provided blanket for comfort. Safety belt alarm on.   Therapy Documentation Precautions:  Precautions Precautions: Posterior Hip, Fall Precaution Comments: verbally reviewed Required Braces or Orthoses: Knee Immobilizer - Right Knee  Immobilizer - Right:  (in bed only, off with therapies) Restrictions Weight Bearing Restrictions: No RLE Weight Bearing: Weight bearing as tolerated Other Position/Activity Restrictions: posterior hip precautions R LE General:     Therapy/Group: Individual Therapy  Cashay Manganelli P Valborg Friar 11/21/2020, 12:08 PM

## 2020-11-22 MED ORDER — HYDROCODONE-ACETAMINOPHEN 5-325 MG PO TABS
1.0000 | ORAL_TABLET | Freq: Two times a day (BID) | ORAL | Status: DC | PRN
Start: 2020-11-22 — End: 2020-11-29
  Administered 2020-11-26: 1 via ORAL
  Filled 2020-11-22: qty 1

## 2020-11-22 MED ORDER — DM-GUAIFENESIN ER 30-600 MG PO TB12
2.0000 | ORAL_TABLET | Freq: Two times a day (BID) | ORAL | Status: DC
  Administered 2020-11-23 (×2): 2 via ORAL
  Filled 2020-11-22: qty 2
  Filled 2020-11-22: qty 1
  Filled 2020-11-22: qty 2

## 2020-11-22 NOTE — Patient Care Conference (Signed)
Inpatient RehabilitationTeam Conference and Plan of Care Update Date: 11/22/2020   Time: 13:02 PM    Patient Name: Jon Reid      Medical Record Number: [id number]  Date of Birth: 1928-05-16 Sex: Male         Room/Bed: 4W08C/4W08C-01 Payor Info: Payor: MEDICARE RAILROAD / Plan: MEDICARE RAILROAD / Product Type: *No Product type* /    Admit Date/Time:  11/17/2020  2:52 PM  Primary Diagnosis:  Femur fracture, right [facility])  Hospital Problems: Principal Problem:   Femur fracture, right ([place]) Active Problems:   S/P hip hemiarthroplasty   Pressure injury of skin   Closed fracture of right hip requiring operative repair, sequela    Expected Discharge Date: Expected Discharge Date: 12/08/20  Team Members Present: Physician leading conference: Dr. Leeroy Cha Social Worker Present: Ovidio Kin, LCSW Nurse Present: Dorien Chihuahua, RN PT Present: Ginnie Smart, PT OT Present: Clyda Greener, OT PPS Coordinator present : Gunnar Fusi, SLP     Current Status/Progress Goal Weekly Team Focus  Bowel/Bladder             Swallow/Nutrition/ Hydration             ADL's   MOD A bathing EOB, MAX A LB dressing, set- up for UB dressing, total A for toilet transfer with stedy, can stand to stedy CGA, total A toileting 3/3 tasks  S- MOD I goals  transfers, activity tolerance, endurance, BADL reeducation, precaution education   Mobility   modA bed mobility, modA transfers, minA gait ~73f  minA overall  global strengthening   Communication             Safety/Cognition/ Behavioral Observations            Pain             Skin               Discharge Planning:  Home with wife who is limited due to own health issues and two son's who are in and out. Will need to confirm 24/7 care   Team Discussion: Patient with persistent congested, productive cough. CXR negative; MD added medications. Progress also limited by nausea.  Patient on target to meet rehab goals: Currently  requires mod assist for bed mobility and min - mod assist for transfers. Requires mod assist for bathing and max assist for lower body dressing but only needs supervision for upper body dressing. Able to ambulate 663 with min assist using a RW. Requires total assist for toileting tasks and hygiene. Goals for discharge set at min assist - supervision level.  *See Care Plan and progress notes for long and short-term goals.   Revisions to Treatment Plan:  Extended ELOS as limited support at home and progress limited by medical issues.  Teaching Needs: Safety, skin care, medications, transfers, toileting, etc.  Current Barriers to Discharge: Home enviroment access/layout and Lack of/limited family support  Possible Resolutions to Barriers: Family education with sons/wife     Medical Summary Current Status: insomnia, cough, sputum production, hip pain, nausea  Barriers to Discharge: Medical stability  Barriers to Discharge Comments: insomnia, cough, sputum production, hip pain, nausea Possible Resolutions to BCelanese CorporationFocus: may use PRN sleep medications, increase Mucinex to BID, CXR stable, continue compazine prn, wean Norco   Continued Need for Acute Rehabilitation Level of Care: The patient requires daily medical management by a physician with specialized training in physical medicine and rehabilitation for the following reasons: Direction of a multidisciplinary physical  rehabilitation program to maximize functional independence : Yes Medical management of patient stability for increased activity during participation in an intensive rehabilitation regime.: Yes Analysis of laboratory values and/or radiology reports with any subsequent need for medication adjustment and/or medical intervention. : Yes   I attest that I was present, lead the team conference, and concur with the assessment and plan of the team.   Dorien Chihuahua B 11/22/2020, 3:29 PM

## 2020-11-22 NOTE — Progress Notes (Signed)
Patient was given PRN Compazine '10mg'$  intravenous push over 1 minute due to pt c/o N/V.Medication was effective. Patient tolerated well with no adverse effect.

## 2020-11-22 NOTE — Progress Notes (Signed)
Physical Therapy Session Note  Patient Details  Name: Jon Reid MRN: [id number] Date of Birth: 1928-03-27  Today's Date: 11/22/2020 PT Individual Time: 1000-1100 + 1330-1355 min PT Individual Time Calculation (min): 60 min + 25 min  Short Term Goals: Week 1:  PT Short Term Goal 1 (Week 1): Pt will perform bed mobility with mod assist PT Short Term Goal 2 (Week 1): Pt will ambulate 48f with mod assist PT Short Term Goal 3 (Week 1): Pt will propell WC with supervision assist around room PT Short Term Goal 4 (Week 1): Pt will consistently transfer with mod assist to and from Bed  Skilled Therapeutic Interventions/Progress Updates:     1st session: Pt supine in bed at start with emesis bag in lap and dry heaving. He reports poor nights rest due to nausea and has not been feeling his best today. He's agreeable to therapy but pleasantly requests light activity only. Extra time provided throughout session for rest breaks as needed. [facility] Sit<>stand to RW from EOB with modA for powering to rise and ambulatory transfer completed with minA and RW to TIS w/c. Pt wheeled to day room rehab gym for time and completed stand step transfer with RW and minA from TIS w/c to Nustep. Required setupA for Nustep and completed a total of 10 minutes with rest breaks at 6 and 8 minute mark. Used all x4 extremities and workload set to 5. Nustep for purposes of general strengthening, improving activity tolerance, and AAROM for RLE at the hip and knee. Pt then completed gait [id number] minA and RW - gait distances limited by fatigue and BLE weakness. Demonstrates short shuffling steps, antalgic gait with R knee flexed in stance, decreased gait speed. On mat table, worked on repeated sit<>stands, 2x5, with CGA and RW - requires frequent cues for hand placement to avoid pulling himself up to stand from his RW. Pt returned to room at end of session, agreeable to remain  seated in TIS w/c (reclined for safety and comfort), safety belt alarm and all needs in reach.   2nd session: Pt seated in TIS w/c at start of session - agreeable to therapy without reports of pain. Wheeled to day room rehab gym for time. Worked on functional gait training to start, ambulated ~3570f ~66f25fth minA and RW - similar deficits as described above - antalgic step-to pattern with short shuffling steps.Verbal cues for normalizing gait pattern and keeping his body within walker frame. Improvement in gait deficits in 2nd gait trial compared to the 1st. Pt wheeled back to his room in TIS w/c. Remained in TIS w/c at conclusion of session with safety belt alarm on and all needs in reach.  Therapy Documentation Precautions:  Precautions Precautions: Posterior Hip, Fall Precaution Comments: verbally reviewed Required Braces or Orthoses: Knee Immobilizer - Right Knee Immobilizer - Right:  (in bed only, off with therapies) Restrictions Weight Bearing Restrictions: No RLE Weight Bearing: Weight bearing as tolerated Other Position/Activity Restrictions: posterior hip precautions R LE General:     Therapy/Group: Individual Therapy  Jon Reid Jon Reid 11/22/2020, 7:50 AM

## 2020-11-22 NOTE — Progress Notes (Signed)
PROGRESS NOTE   Subjective/Complaints:  [name] complains of continued cough and sputum production. He feels short of breath when coughing.  No other complaints  ROS: +cough, +sputum production  Objective:   No results found. Recent Labs    11/21/20 0535  WBC 7.0  HGB 8.0*  HCT 24.5*  PLT 318   Recent Labs    11/21/20 0535  NA 137  K 4.4  CL 103  CO2 24  GLUCOSE 109*  BUN 18  CREATININE 1.23  CALCIUM 8.6*    Intake/Output Summary (Last 24 hours) at 11/22/2020 1255 Last data filed at 11/22/2020 1249 Gross per 24 hour  Intake 610 ml  Output 250 ml  Net 360 ml     Pressure Injury 11/17/20 Sacrum Mid Stage 2 -  Partial thickness loss of dermis presenting as a shallow open injury with a red, pink wound bed without slough. round red shallow crater surrounded by denuded area with serous drainage (Active)  11/17/20 0315  Location: Sacrum  Location Orientation: Mid  Staging: Stage 2 -  Partial thickness loss of dermis presenting as a shallow open injury with a red, pink wound bed without slough.  Wound Description (Comments): round red shallow crater surrounded by denuded area with serous drainage  Present on Admission: Yes    Physical Exam: Vital Signs Blood pressure 127/60, pulse 69, temperature 98.3 F (36.8 C), temperature source Oral, resp. rate 16, height 6' (1.829 m), weight 70.8 kg, SpO2 95 %. Gen: no distress, normal appearing HEENT: oral mucosa pink and moist, NCAT Cardio: Reg rate Chest: normal effort, normal rate of breathing Abd: soft, non-distended Ext: no edema Psych: pleasant, normal affect Musculoskeletal:     Cervical back: Normal range of motion. No rigidity.     Comments: Right hip incision covered with steri-strips.  Moderate edema, no erythema seen.  UE 5-/5 B/L RLE- HF 3/5- in brace from mid thigh to R ankle- cannot test- knee; DF/PF 4-/5 LLE- 4-/5 Skin:    Comments: Bilateral  forearms with multiple ecchymotic areas.  L forearm iv- looks ok- but pulling at skin Peeling skin on top of head with actinic keratosis.   Neurological:     Mental Status: He is alert and oriented to person, place, and time.     Comments: Decreased hearing. Speech clear. Able to follow simple commands without difficulty.  Intact to light touch x 4  Psychiatric:     Comments: Interactive; HOH    Assessment/Plan: 1. Functional deficits which require 3+ hours per day of interdisciplinary therapy in a comprehensive inpatient rehab setting. Physiatrist is providing close team supervision and 24 hour management of active medical problems listed below. Physiatrist and rehab team continue to assess barriers to discharge/monitor patient progress toward functional and medical goals  Care Tool:  Bathing    Body parts bathed by patient: Right arm, Left arm, Chest, Abdomen, Front perineal area, Face, Right upper leg, Left upper leg   Body parts bathed by helper: Buttocks     Bathing assist Assist Level: Moderate Assistance - Patient 50 - 74%     Upper Body Dressing/Undressing Upper body dressing   What is the patient wearing?:  Hospital gown only    Upper body assist Assist Level: Maximal Assistance - Patient 25 - 49%    Lower Body Dressing/Undressing Lower body dressing      What is the patient wearing?: Incontinence brief, Pants     Lower body assist Assist for lower body dressing: Maximal Assistance - Patient 25 - 49%     Toileting Toileting    Toileting assist Assist for toileting: Total Assistance - Patient < 25%     Transfers Chair/bed transfer  Transfers assist     Chair/bed transfer assist level: Dependent - Patient 0% (stedy)     Locomotion Ambulation   Ambulation assist   Ambulation activity did not occur: Safety/medical concerns  Assist level: Moderate Assistance - Patient 50 - 74% Assistive device: Walker-rolling Max distance: 40'   Walk 10 feet  activity   Assist  Walk 10 feet activity did not occur: Safety/medical concerns  Assist level: Moderate Assistance - Patient - 50 - 74% Assistive device: Walker-rolling   Walk 50 feet activity   Assist Walk 50 feet with 2 turns activity did not occur: Safety/medical concerns         Walk 150 feet activity   Assist Walk 150 feet activity did not occur: Safety/medical concerns         Walk 10 feet on uneven surface  activity   Assist Walk 10 feet on uneven surfaces activity did not occur: Safety/medical concerns         Wheelchair     Assist Is the patient using a wheelchair?: Yes Type of Wheelchair: Manual Wheelchair activity did not occur: Refused         Wheelchair 50 feet with 2 turns activity    Assist    Wheelchair 50 feet with 2 turns activity did not occur: Refused       Wheelchair 150 feet activity     Assist  Wheelchair 150 feet activity did not occur: Refused       Blood pressure 127/60, pulse 69, temperature 98.3 F (36.8 C), temperature source Oral, resp. rate 16, height 6' (1.829 m), weight 70.8 kg, SpO2 95 %.  Medical Problem List and Plan: 1.  R hip fx s/p hemiarthroplasty secondary to multiple falls             -patient may  shower if covered       -ELOS/Goals: 16-18 days- min Assist Decrease therapy to QD -Interdisciplinary Team Conference today   2.  Antithrombotics: -DVT/anticoagulation:  Pharmaceutical: Continue Lovenox             -antiplatelet therapy: On ASA bid for DVT prophylaxis?-->decrease to once a day as on lovenox.  3. Pain: Decrease Norco to q12H prn for severe pain, tylenol prn.  4. Mood: LCSW to follow for evaluation and  support.              -antipsychotic agents: N/A 5. Neuropsych: This patient is not fully capable of making decisions on his own behalf. 6. Skin/Wound Care: Monitor wound for healing.  7. Fluids/Electrolytes/Nutrition: Monitor I/O. Check lytes in am. 8. Cough: New due to  question aspiration event--Has been on Mucinex since 08/30-->+ Covid 8/26, afeb , WBC nl , CXR 9/1 low vol but unremarkable may d/c isolation, needs to wear mask when out of room. Increase Mucinex to 2 tabs BID.  9. GERD: Continue Protonix BID 10. Parkinson's disease: Managed with Sinemet IR TID 11. CAD s/p CABG: Monitor for symptoms with increase in activity. Continue ASA.              --  hold lasix due to intermittent low BP.  12. Acute on chronic anemia due to blood loss anemia: Hgb reviewed 9/2- decreased to 8.2, repeat 9/5 stable at 8.0             --resume iron supplement.  13. Constipation: Will add Senna S 14. Hypotension: medications reviewed, not on any medications for hypertension.  Vitals:   11/22/20 0144 11/22/20 0435  BP: 135/63 127/60  Pulse: 68 69  Resp: 14 16  Temp: 98.5 F (36.9 C) 98.3 F (36.8 C)  SpO2: 94% 95%    15. Hypokalemia: 21mq K+ supplement today, K+ normal on 9/5    LOS: 5 days A FACE TO FACE EVALUATION WAS PERFORMED  KClide DeutscherRaulkar 11/22/2020, 12:55 PM

## 2020-11-22 NOTE — Plan of Care (Signed)
  Problem: RH Balance Goal: LTG Patient will maintain dynamic standing balance (PT) Description: LTG:  Patient will maintain dynamic standing balance with assistance during mobility activities (PT) Flowsheets (Taken 11/22/2020 1322) LTG: Pt will maintain dynamic standing balance during mobility activities with:: Supervision/Verbal cueing Note: Upgraded due to progress   Problem: Sit to Stand Goal: LTG:  Patient will perform sit to stand with assistance level (PT) Description: LTG:  Patient will perform sit to stand with assistance level (PT) Flowsheets (Taken 11/22/2020 1322) LTG: PT will perform sit to stand in preparation for functional mobility with assistance level: Supervision/Verbal cueing Note: Upgraded due to progress   Problem: RH Bed Mobility Goal: LTG Patient will perform bed mobility with assist (PT) Description: LTG: Patient will perform bed mobility with assistance, with/without cues (PT). Flowsheets (Taken 11/22/2020 1322) LTG: Pt will perform bed mobility with assistance level of: Supervision/Verbal cueing Note: Upgraded due to progress   Problem: RH Bed to Chair Transfers Goal: LTG Patient will perform bed/chair transfers w/assist (PT) Description: LTG: Patient will perform bed to chair transfers with assistance (PT). Flowsheets (Taken 11/22/2020 1322) LTG: Pt will perform Bed to Chair Transfers with assistance level: Supervision/Verbal cueing Note: Upgraded due to progress   Problem: RH Car Transfers Goal: LTG Patient will perform car transfers with assist (PT) Description: LTG: Patient will perform car transfers with assistance (PT). Flowsheets (Taken 11/22/2020 1322) LTG: Pt will perform car transfers with assist:: Contact Guard/Touching assist Note: Upgraded due to progress   Problem: RH Ambulation Goal: LTG Patient will ambulate in controlled environment (PT) Description: LTG: Patient will ambulate in a controlled environment, # of feet with assistance  (PT). Flowsheets (Taken 11/22/2020 1322) LTG: Pt will ambulate in controlled environ  assist needed:: Supervision/Verbal cueing LTG: Ambulation distance in controlled environment: 176f Note: Upgraded due to progress Goal: LTG Patient will ambulate in home environment (PT) Description: LTG: Patient will ambulate in home environment, # of feet with assistance (PT). Flowsheets (Taken 11/22/2020 1322) LTG: Pt will ambulate in home environ  assist needed:: Supervision/Verbal cueing LTG: Ambulation distance in home environment: 514fNote: Upgraded due to progress

## 2020-11-22 NOTE — Progress Notes (Signed)
Patient ID: Jon Reid, male   DOB: [birth year], 87 y.o.   MRN: [id number]  Met with pt and left message for Lynn-son to discuss team conference of supervision level and target discharge date of 9/22. Pt voiced he can tell them but worker wanted to touch base with one of them to see if any questions or concerns. Pt voiced he is trying and feels like he is making progress here. Will await return call from son

## 2020-11-22 NOTE — Progress Notes (Signed)
Occupational Therapy Session Note  Patient Details  Name: Jon Reid MRN: [id number] Date of Birth: 01-Aug-1928  Today's Date: 11/22/2020 OT Individual Time: 1500-1530 OT Individual Time Calculation (min): 30 min    Short Term Goals: Week 1:  OT Short Term Goal 1 (Week 1): Pt will sit to stand from EOB to RW with min A. OT Short Term Goal 2 (Week 1): Pt will be able to stand at [facility] with S while therapist A him with managing clothing over hips. OT Short Term Goal 3 (Week 1): Pt will be able to don pants over feet with reacher with min A. OT Short Term Goal 4 (Week 1): Pt will be able to self cleanse post toileting with mod A.  Skilled Therapeutic Interventions/Progress Updates:  Pt greeted seated in TIS agreeable to OT intervention. Session focus on functional mobility, education related to hip precautions and LB AE for dressing. Pt completed sit<>stand from w/c with RW and MIN A. Pt completed ambulatory transfer back to bed ~ 5 ft with RW and MIN A. Education provided on posterior hip precautions with pt initially able to verbalize 2/3 but after education pt able to state 3/3 via teach back method. Provided visual demo of compensatory methods for LB ADLs with one being use of reacher. Pt reports he thinks he has a Secondary school teacher at home, provided demo of using reacher to don pants and left reacher in room for pt to use next OT session to practice donning pants during ADL routine. Discussed bed mobility at home as pt reports he sleeps on couch, provided education and demo of compensatory method for returning to supine without breaking hip precautions as pt currently needs MAX A to transition from sit>supine. Pt reports at home he can just scoot back onto couch once sitting down. Pt left supine in bed with bed alarm activated and all needs within reach.   Therapy Documentation Precautions:  Precautions Precautions: Posterior Hip, Fall Precaution Comments: verbally reviewed Required Braces or Orthoses:  Knee Immobilizer - Right Knee Immobilizer - Right:  (in bed only, off with therapies) Restrictions Weight Bearing Restrictions: No RLE Weight Bearing: Weight bearing as tolerated Other Position/Activity Restrictions: posterior hip precautions R LE  Pain: Pt reports no pain during session.     Therapy/Group: Individual Therapy  Precious Haws 11/22/2020, 3:55 PM

## 2020-11-22 NOTE — Progress Notes (Signed)
Occupational Therapy Session Note  Patient Details  Name: QUINTON KOSMALSKI MRN: [id number] Date of Birth: 1928-08-09  Today's Date: 11/22/2020 OT Individual Time: 0803-0900 OT Individual Time Calculation (min): 57 min    Short Term Goals: Week 1:  OT Short Term Goal 1 (Week 1): Pt will sit to stand from EOB to RW with min A. OT Short Term Goal 2 (Week 1): Pt will be able to stand at [facility] with S while therapist A him with managing clothing over hips. OT Short Term Goal 3 (Week 1): Pt will be able to don pants over feet with reacher with min A. OT Short Term Goal 4 (Week 1): Pt will be able to self cleanse post toileting with mod A.  Skilled Therapeutic Interventions/Progress Updates:  Pt greeted supine in bed  agreeable to OT intervention with encouragement as pt reports feeling weak and reports throwing up this AM, with brown mucus noted on wash cloth from dry heaving. Session focus on BADL reeducation. Pt required MOD A for bed mobility with pt needing most assist to elevate trunk into sitting and maintain precautions. Pt completed sit<>stand to stedy with CGA. Pt transported to toilet with total A in stedy. Pt required total A for 3/3 toileting tasks but did attempt posterior pericare in standing with MIN A hiowever pt needed total A for cleanliness and to pull pants yup to waist line. Of note pt with large, dark, loose BM- RN aware. Pt transported out of BR to EOB for bathing, pt completed bathing EOB with MOD A. MAX A for UB dressing and MAX A for LB Dressing with pt assist to pull pants up to waist line on L side. Pt returned to supine with MAX A. pt left supine in bed with RN present providing AM meds.                        Therapy Documentation Precautions:  Precautions Precautions: Posterior Hip, Fall Precaution Comments: verbally reviewed Required Braces or Orthoses: Knee Immobilizer - Right Knee Immobilizer - Right:  (in bed only, off with therapies) Restrictions Weight Bearing  Restrictions: No RLE Weight Bearing: Weight bearing as tolerated Other Position/Activity Restrictions: posterior hip precautions R LE    Pain: Pt reports no pain other than fatigue from being up most of the night.     Therapy/Group: Individual Therapy  Corinne Ports [facility] 11/22/2020, 11:28 AM

## 2020-11-23 ENCOUNTER — Ambulatory Visit: Payer: 59 | Admitting: Gastroenterology

## 2020-11-23 ENCOUNTER — Inpatient Hospital Stay (HOSPITAL_COMMUNITY): Payer: MEDICARE

## 2020-11-23 LAB — LACTIC ACID, PLASMA
Lactic Acid, Venous: 2.6 mmol/L (ref 0.5–1.9)
Lactic Acid, Venous: 1.9 mmol/L (ref 0.5–1.9)

## 2020-11-23 LAB — CBC WITH DIFFERENTIAL/PLATELET
Abs Immature Granulocytes: 0.13 10*3/uL — ABNORMAL HIGH (ref 0.00–0.07)
Basophils Absolute: 0 10*3/uL (ref 0.0–0.1)
Basophils Relative: 0 %
Eosinophils Absolute: 0 10*3/uL (ref 0.0–0.5)
Eosinophils Relative: 0 %
HCT: 21.9 % — ABNORMAL LOW (ref 39.0–52.0)
Hemoglobin: 7.1 g/dL — ABNORMAL LOW (ref 13.0–17.0)
Immature Granulocytes: 1 %
Lymphocytes Relative: 6 %
Lymphs Abs: 1.2 10*3/uL (ref 0.7–4.0)
MCH: 31.7 pg (ref 26.0–34.0)
MCHC: 32.4 g/dL (ref 30.0–36.0)
MCV: 97.8 fL (ref 80.0–100.0)
Monocytes Absolute: 1.1 10*3/uL — ABNORMAL HIGH (ref 0.1–1.0)
Monocytes Relative: 5 %
Neutro Abs: 18 10*3/uL — ABNORMAL HIGH (ref 1.7–7.7)
Neutrophils Relative %: 88 %
Platelets: 403 10*3/uL — ABNORMAL HIGH (ref 150–400)
RBC: 2.24 MIL/uL — ABNORMAL LOW (ref 4.22–5.81)
RDW: 16.2 % — ABNORMAL HIGH (ref 11.5–15.5)
WBC: 20.5 10*3/uL — ABNORMAL HIGH (ref 4.0–10.5)
nRBC: 0 % (ref 0.0–0.2)

## 2020-11-23 LAB — URINALYSIS, ROUTINE W REFLEX MICROSCOPIC
Bilirubin Urine: NEGATIVE
Glucose, UA: NEGATIVE mg/dL
Hgb urine dipstick: NEGATIVE
Ketones, ur: NEGATIVE mg/dL
Leukocytes,Ua: NEGATIVE
Nitrite: NEGATIVE
Protein, ur: NEGATIVE mg/dL
Specific Gravity, Urine: 1.015 (ref 1.005–1.030)
pH: 6 (ref 5.0–8.0)

## 2020-11-23 LAB — PROCALCITONIN: Procalcitonin: 0.59 ng/mL

## 2020-11-23 LAB — BASIC METABOLIC PANEL
Anion gap: 7 (ref 5–15)
BUN: 36 mg/dL — ABNORMAL HIGH (ref 8–23)
CO2: 24 mmol/L (ref 22–32)
Calcium: 8.6 mg/dL — ABNORMAL LOW (ref 8.9–10.3)
Chloride: 105 mmol/L (ref 98–111)
Creatinine, Ser: 1.37 mg/dL — ABNORMAL HIGH (ref 0.61–1.24)
GFR, Estimated: 49 mL/min — ABNORMAL LOW (ref >60)
Glucose, Bld: 134 mg/dL — ABNORMAL HIGH (ref 70–99)
Potassium: 4.2 mmol/L (ref 3.5–5.1)
Sodium: 136 mmol/L (ref 135–145)

## 2020-11-23 MED ORDER — ASPIRIN EC 81 MG PO TBEC
81.0000 mg | DELAYED_RELEASE_TABLET | Freq: Every day | ORAL | Status: DC
  Administered 2020-11-23 – 2020-12-08 (×16): 81 mg via ORAL
  Filled 2020-11-23 (×16): qty 1

## 2020-11-23 MED ORDER — PIPERACILLIN-TAZOBACTAM 3.375 G IVPB
3.3750 g | Freq: Three times a day (TID) | INTRAVENOUS | Status: DC
  Administered 2020-11-23 – 2020-11-25 (×6): 3.375 g via INTRAVENOUS
  Filled 2020-11-23 (×7): qty 50

## 2020-11-23 MED ORDER — VANCOMYCIN HCL 1500 MG/300ML IV SOLN
1500.0000 mg | Freq: Once | INTRAVENOUS | Status: AC
  Administered 2020-11-23: 1500 mg via INTRAVENOUS
  Filled 2020-11-23: qty 300

## 2020-11-23 MED ORDER — MELATONIN 5 MG PO TABS
5.0000 mg | ORAL_TABLET | Freq: Every day | ORAL | Status: DC
  Administered 2020-11-23 – 2020-12-07 (×15): 5 mg via ORAL
  Filled 2020-11-23 (×15): qty 1

## 2020-11-23 MED ORDER — SODIUM CHLORIDE 0.9 % IV SOLN: INTRAVENOUS | Status: DC

## 2020-11-23 MED ORDER — FUROSEMIDE 10 MG/ML IJ SOLN: 20.0000 mg | Freq: Once | INTRAMUSCULAR | Status: DC

## 2020-11-23 MED ORDER — SODIUM CHLORIDE 0.9 % IV SOLN
75.0000 mL/h | INTRAVENOUS | Status: AC
  Administered 2020-11-23: 75 mL/h via INTRAVENOUS

## 2020-11-23 MED ORDER — VANCOMYCIN HCL IN DEXTROSE 1-5 GM/200ML-% IV SOLN
1000.0000 mg | INTRAVENOUS | Status: DC
  Filled 2020-11-23: qty 200

## 2020-11-23 MED ORDER — POTASSIUM CHLORIDE IN NACL 20-0.9 MEQ/L-% IV SOLN
INTRAVENOUS | Status: DC
  Filled 2020-11-23: qty 1000

## 2020-11-23 NOTE — Progress Notes (Addendum)
Patient noted with increased confusion but continues to be alert and oriented x2/x3; able to follow simple commands, aware of age, and current location. VSS. Dr. Adam Phenix notified. New orders received.

## 2020-11-23 NOTE — Progress Notes (Signed)
Critical Lactic acid of 2.6, called in from the lab. Notified on call physician, DAN, PA. No orders given continue to monitor patient. Also notified of [place] will follow protocol . And notify if any changes

## 2020-11-23 NOTE — Progress Notes (Signed)
Discussed patient with Dr. Christella Hartigan aspiration PNA with RLL infiltrate. Left base has scar c/w prior films.  He did have confusion last pm with leucocytosis-hypotensive with therapy. Could be PNA or now with Covid re-infection--not hypoxic so rules that out. Will order inflammatory markers. Briefly discussed with PCCM PA -->review CXR for input. He questioned changes in RLL likely due to aspiration with mild overload?  c/w  8/30 films.  If SIRS/infectious concurred with gentle hydration and monitoring for changes 24 hours post atbx.

## 2020-11-23 NOTE — Progress Notes (Signed)
Physical Therapy Session Note  Patient Details  Name: Jon Reid MRN: [id number] Date of Birth: 1928-11-03  Today's Date: 11/23/2020 PT Missed Time: 75 Minutes  PT Treatment Time: [id number] + [id number]  Missed Time Reason: Xray;Other (Comment) (Being transported off the floor for XR) 45 min + 30 min  Short Term Goals: Week 1:  PT Short Term Goal 1 (Week 1): Pt will perform bed mobility with mod assist PT Short Term Goal 2 (Week 1): Pt will ambulate 10f with mod assist PT Short Term Goal 3 (Week 1): Pt will propell WC with supervision assist around room PT Short Term Goal 4 (Week 1): Pt will consistently transfer with mod assist to and from Bed  Skilled Therapeutic Interventions/Progress Updates:     1st session:  Pt being transferred off the floor for XR. Will reattempt as schedule and pt availability permits.   2nd session: Pt seated in TIS w/c at start of session. Son and wife at bedside. Updated them on PT goals, pt's current mobility, etc. They had questions regarding LOS and DME rec's - infromed them of 9/22 anticipated DC date as well as future DME rec's as we approach discharge. Pt wheeled to main rehab gym for time in w/c. Assisted to mat table via stand<>pivot transfer - required modA for powering to rise and min/modA for standing balance while transferring. Pt reports feeling dizzy while standing during the transfer. Assisted to supine on mat table with large wedge to support shoulders.   Orthostatics taken: Supine: 122/52 HR 77 Seated: 108/44 HR 79 Standing: 84/40 HR 78  Pt assisted back to w/c in similar manner as above and returned to his room where he remained reclined in TIS w/c with all needs in reach. RN notified via secure chat of BP findings.   3rd session: Pt sleeping in TIS w/c on arrival, awakens to voice and is agreeable to therapy. Requests to stay up in his chair after completion of session. Wheeled to day room rehab gym for time. Assisted to Nustep via  stand<>pivot transfer with modA for standing and min/modA for balance during transfer. With transfer, he has very short shuffling steps with difficulty lifting up his feet to transfer, also has almost freezing episodes with difficulty sequencing steps/tasks. He required setupA on Nustep and completed a total of 6 minutes using all 4 extremities with workload set to 3 - focused on self AAROM for R hip and knee and also facilitating longer strides to ensure active hip/knee extension. Pt then assisted off of Nustep in similar manner with modA for stand<>pivot transfer. Returned to his room and remained slightly reclined in TIS w/c with safety belt alarm on and all needs within reach.   Therapy Documentation Precautions:  Precautions Precautions: Posterior Hip, Fall Precaution Comments: verbally reviewed Required Braces or Orthoses: Knee Immobilizer - Right Knee Immobilizer - Right:  (in bed only, off with therapies) Restrictions Weight Bearing Restrictions: No RLE Weight Bearing: Weight bearing as tolerated Other Position/Activity Restrictions: posterior hip precautions R LE General:    Therapy/Group: Individual Therapy  Kramer Hanrahan P Ruddy Swire PT 11/23/2020, 7:34 AM

## 2020-11-23 NOTE — Progress Notes (Signed)
Patient oriented to person and place this evening, disoriented to time and situation.  Denies pain.  Refusing HS medications.  Spoke to family at length over phone due to family concerns regarding confusion.   Patient has hx of sundowning at times and reports poor sleep over past several days.   Will continue to monitor for any changes.

## 2020-11-23 NOTE — Progress Notes (Signed)
PROGRESS NOTE   Subjective/Complaints: [name] is sleepy this morning. Heard from nursing that he was more confused last night and refused his medications- was hallucinating. Checking UA/UC and labs today, as well as CXR. He continues to be coughing a lot. Has not slept well the last two nights due to this.   ROS: +cough, +sputum production, +insomnia  Objective:   No results found. Recent Labs    11/21/20 0535  WBC 7.0  HGB 8.0*  HCT 24.5*  PLT 318   Recent Labs    11/21/20 0535  NA 137  K 4.4  CL 103  CO2 24  GLUCOSE 109*  BUN 18  CREATININE 1.23  CALCIUM 8.6*    Intake/Output Summary (Last 24 hours) at 11/23/2020 1004 Last data filed at 11/22/2020 1902 Gross per 24 hour  Intake 370 ml  Output 400 ml  Net -30 ml     Pressure Injury 11/17/20 Sacrum Mid Stage 2 -  Partial thickness loss of dermis presenting as a shallow open injury with a red, pink wound bed without slough. round red shallow crater surrounded by denuded area with serous drainage (Active)  11/17/20 0315  Location: Sacrum  Location Orientation: Mid  Staging: Stage 2 -  Partial thickness loss of dermis presenting as a shallow open injury with a red, pink wound bed without slough.  Wound Description (Comments): round red shallow crater surrounded by denuded area with serous drainage  Present on Admission: Yes    Physical Exam: Vital Signs Blood pressure (!) 118/54, pulse (!) 56, temperature 98.6 F (37 C), temperature source Oral, resp. rate 16, height 6' (1.829 m), weight 70.8 kg, SpO2 (!) 84 %. Gen: no distress, normal appearing HEENT: oral mucosa pink and moist, NCAT Cardio: Bradycardia Chest: normal effort, normal rate of breathing Abd: soft, non-distended Ext: no edema Psych: pleasant, normal affect Musculoskeletal:     Cervical back: Normal range of motion. No rigidity.     Comments: Right hip incision covered with steri-strips.   Moderate edema, no erythema seen.  UE 5-/5 B/L RLE- HF 3/5- in brace from mid thigh to R ankle- cannot test- knee; DF/PF 4-/5 LLE- 4-/5 Skin:    Comments: Bilateral forearms with multiple ecchymotic areas.  L forearm iv- looks ok- but pulling at skin Peeling skin on top of head with actinic keratosis.   Neurological:     Mental Status: He is alert and oriented to person, place, and time.     Comments: Decreased hearing. Speech clear. Able to follow simple commands without difficulty.  Intact to light touch x 4  Psychiatric:     Comments: Interactive; HOH    Assessment/Plan: 1. Functional deficits which require 3+ hours per day of interdisciplinary therapy in a comprehensive inpatient rehab setting. Physiatrist is providing close team supervision and 24 hour management of active medical problems listed below. Physiatrist and rehab team continue to assess barriers to discharge/monitor patient progress toward functional and medical goals  Care Tool:  Bathing    Body parts bathed by patient: Right arm, Left arm, Chest, Abdomen, Front perineal area, Face, Right upper leg, Left upper leg   Body parts bathed by helper: Buttocks  Bathing assist Assist Level: Moderate Assistance - Patient 50 - 74%     Upper Body Dressing/Undressing Upper body dressing   What is the patient wearing?: Hospital gown only    Upper body assist Assist Level: Maximal Assistance - Patient 25 - 49%    Lower Body Dressing/Undressing Lower body dressing      What is the patient wearing?: Incontinence brief, Pants     Lower body assist Assist for lower body dressing: Maximal Assistance - Patient 25 - 49%     Toileting Toileting    Toileting assist Assist for toileting: Total Assistance - Patient < 25%     Transfers Chair/bed transfer  Transfers assist     Chair/bed transfer assist level: Minimal Assistance - Patient > 75%     Locomotion Ambulation   Ambulation assist   Ambulation  activity did not occur: Safety/medical concerns  Assist level: Moderate Assistance - Patient 50 - 74% Assistive device: Walker-rolling Max distance: 40'   Walk 10 feet activity   Assist  Walk 10 feet activity did not occur: Safety/medical concerns  Assist level: Moderate Assistance - Patient - 50 - 74% Assistive device: Walker-rolling   Walk 50 feet activity   Assist Walk 50 feet with 2 turns activity did not occur: Safety/medical concerns         Walk 150 feet activity   Assist Walk 150 feet activity did not occur: Safety/medical concerns         Walk 10 feet on uneven surface  activity   Assist Walk 10 feet on uneven surfaces activity did not occur: Safety/medical concerns         Wheelchair     Assist Is the patient using a wheelchair?: Yes Type of Wheelchair: Manual Wheelchair activity did not occur: Refused         Wheelchair 50 feet with 2 turns activity    Assist    Wheelchair 50 feet with 2 turns activity did not occur: Refused       Wheelchair 150 feet activity     Assist  Wheelchair 150 feet activity did not occur: Refused       Blood pressure (!) 118/54, pulse (!) 56, temperature 98.6 F (37 C), temperature source Oral, resp. rate 16, height 6' (1.829 m), weight 70.8 kg, SpO2 (!) 84 %.  Medical Problem List and Plan: 1.  R hip fx s/p hemiarthroplasty secondary to multiple falls             -patient may  shower if covered       -ELOS/Goals: 16-18 days- min Assist Increase therapy to regular volume 2.  Antithrombotics: -DVT/anticoagulation:  Pharmaceutical: Continue Lovenox             -antiplatelet therapy: On ASA bid for DVT prophylaxis?-->decrease to once a day as on lovenox.  3. Pain: Decrease Norco to q12H prn for severe pain, tylenol prn.  4. Mood: LCSW to follow for evaluation and  support.              -antipsychotic agents: N/A 5. Neuropsych: This patient is not fully capable of making decisions on his own  behalf. 6. Skin/Wound Care: Monitor wound for healing.  7. Fluids/Electrolytes/Nutrition: Monitor I/O. Check lytes in am. 8. Cough: New due to question aspiration event--Has been on Mucinex since 08/30-->+ Covid 8/26, afeb , WBC nl , CXR 9/1 low vol but unremarkable may d/c isolation, needs to wear mask when out of room. Increase Mucinex to 2 tabs BID.  9. GERD: Continue Protonix BID 10. Parkinson's disease: Managed with Sinemet IR TID 11. CAD s/p CABG: Monitor for symptoms with increase in activity. Continue ASA.              --hold lasix due to intermittent low BP.  12. Acute on chronic anemia due to blood loss anemia: Hgb reviewed 9/2- decreased to 8.2, repeat 9/5 stable at 8.0             --resume iron supplement.  13. Constipation: Continue Senna S 14. Hypotension: medications reviewed, not on any medications for hypertension.  Vitals:   11/23/20 0756 11/23/20 0807  BP: (!) 118/54   Pulse: (!) 56   Resp: 16   Temp:  98.6 F (37 C)  SpO2: (!) 84%     15. Hypokalemia: 39mq K+ supplement today, K+ normal on 9/5 16. Confusion: likely sundowning: check CXR, CBC, BMP, UA/UC to assess for infection.  17. Insomnia: continue trazodone.     LOS: 6 days A FACE TO FACE EVALUATION WAS PERFORMED  Sharah Finnell P Keiton Cosma 11/23/2020, 10:04 AM

## 2020-11-23 NOTE — Progress Notes (Signed)
Jon Reid, PT notified this nurse that  This patient was showing signs and symptoms of orthostatic hypotension. Reesa Chew, PA made aware. New orders received.

## 2020-11-23 NOTE — Progress Notes (Signed)
Occupational Therapy Session Note  Patient Details  Name: Jon Reid MRN: [id number] Date of Birth: 17-Apr-1928  Today's Date: 11/23/2020 OT Individual Time: [id number] OT Individual Time Calculation (min): 44 min    Short Term Goals: Week 1:  OT Short Term Goal 1 (Week 1): Pt will sit to stand from EOB to RW with min A. OT Short Term Goal 2 (Week 1): Pt will be able to stand at [facility] with S while therapist A him with managing clothing over hips. OT Short Term Goal 3 (Week 1): Pt will be able to don pants over feet with reacher with min A. OT Short Term Goal 4 (Week 1): Pt will be able to self cleanse post toileting with mod A.  Skilled Therapeutic Interventions/Progress Updates:  Pt greeted supine in bed, asleep but easily able to arouse and  agreeable to OT intervention. Session focus on BADL reeducation, LB AE training, transfer training and BUE therex. Pt required MAX A for bed mobility with pt needing most assist to maneuver RLE off of bed, scoot hips to EOB and elevate trunk into sitting. Pt reported needing to void bladder, pt sit<>stand with MOD A from EOB to use urinal d/t urgency with pt needing total A for 3/3 toileting tasks. Pt utilized reacher to don pants from EOB with MAX A, pt required step by step cues to sequence task and set- up assist of reacher in L hand to initiate task. Pt noted to reach for OTA glove vs pants during LB dressing ( ? Some visual deficits vs proprioception deficits as glove and pants were the same color however pt denied visual deficits). Pt completed additional sit<>stand to pull pants up to waist line with more of MIN A to stand this trial. Pt able to assist with pulling pants up to waist line with LUE while OTA assist with R side. Pt completed ambulatory transfer to w/c with RW ( ~ 6 ft) and MIN A. Pt required MAX cues for safety needing assist to recall reaching back prior to descending into sitting surface. Pt completed below therex from w/c to increase BUE  strength and endurance for higher level functional mobility with level 1 theraband:  X10 reps of chest pulls X10 bicep curls X10 shoulder flexion  Of note pt needed initial hand over hand assist to complete ROM.  Pt left seated in w/c with safety belt activated and all needs within reach.                        Therapy Documentation Precautions:  Precautions Precautions: Posterior Hip, Fall Precaution Comments: verbally reviewed Required Braces or Orthoses: Knee Immobilizer - Right Knee Immobilizer - Right:  (in bed only, off with therapies) Restrictions Weight Bearing Restrictions: No RLE Weight Bearing: Weight bearing as tolerated Other Position/Activity Restrictions: posterior hip precautions R LE  Pain: pt reports no pain during session.     Therapy/Group: Individual Therapy  Precious Haws 11/23/2020, 12:20 PM

## 2020-11-23 NOTE — Progress Notes (Signed)
Per night shift nurse patient complained of N/V/D. One dose of compazine given IV ( on night shift) patient denied any nausea during day shift. Tech made nurse aware emesis was dark in color and patient had a dark colored stool. Nurse made assessment and reported findings to [place], [place]. No new orders at this time. Oncoming nurse was made aware.

## 2020-11-23 NOTE — Progress Notes (Signed)
Pharmacy Antibiotic Note  Jon Reid is a 87 y.o. male admitted on 11/17/2020  after [facility] hospitalization on 8/26 for right hip fracture after a fall  at home.  S/p R THR on 11/13/20.  Pharmacy has been consulted for Vancomycin and Zosyn dosing for aspiration pneumonia.  Plan: Zosyn 3.375 g IV q8h  (extended 4 hr infusion)  Vancomycin 1500 mg x1 then 1000 mg IV Q 24 hrs. Goal AUC 400-550. Expected AUC: 529 SCr used: 1.37 Monitor clinical progress, renal function. Check steady state vancomycin  levels per protocol if needed.   Ordered BMET daily x 5 days per vanc protocol to f/u SCr in 87 y.o on vanc.  Height: 6' (182.9 cm) Weight: 70.8 kg (156 lb 1.4 oz) IBW/kg (Calculated) : 77.6  Temp (24hrs), Avg:98.4 F (36.9 C), Min:98 F (36.7 C), Max:98.6 F (37 C)  Recent Labs  Lab 11/17/20 0116 11/19/20 0501 11/21/20 0535 11/23/20 1019  WBC 10.4 9.6 7.0 20.5*  CREATININE  --  1.36* 1.23 1.37*    Estimated Creatinine Clearance: 35.9 mL/min (A) (by C-G formula based on SCr of 1.37 mg/dL (H)).    Allergies  Allergen Reactions   Crestor [Rosuvastatin] Swelling    GYNECOMASTIA     Antimicrobials this admission: Vanc 9/7>> Zosyn 9/7>>  Dose adjustments this admission:   Microbiology results: 9/7 UCx:  8/26  MRSA PCR: neg;  SA: neg  Thank you for allowing pharmacy to be a part of this patient's care.  Nicole Cella, [place] Clinical Pharmacist [phone] 11/23/2020 1:36 PM

## 2020-11-24 ENCOUNTER — Inpatient Hospital Stay (HOSPITAL_COMMUNITY): Payer: MEDICARE

## 2020-11-24 LAB — CBC WITH DIFFERENTIAL/PLATELET
Abs Immature Granulocytes: 0.17 10*3/uL — ABNORMAL HIGH (ref 0.00–0.07)
Basophils Absolute: 0 10*3/uL (ref 0.0–0.1)
Basophils Relative: 0 %
Eosinophils Absolute: 0 10*3/uL (ref 0.0–0.5)
Eosinophils Relative: 0 %
HCT: 20.1 % — ABNORMAL LOW (ref 39.0–52.0)
Hemoglobin: 6.4 g/dL — CL (ref 13.0–17.0)
Immature Granulocytes: 1 %
Lymphocytes Relative: 8 %
Lymphs Abs: 1.4 10*3/uL (ref 0.7–4.0)
MCH: 31.4 pg (ref 26.0–34.0)
MCHC: 31.8 g/dL (ref 30.0–36.0)
MCV: 98.5 fL (ref 80.0–100.0)
Monocytes Absolute: 0.9 10*3/uL (ref 0.1–1.0)
Monocytes Relative: 5 %
Neutro Abs: 15.5 10*3/uL — ABNORMAL HIGH (ref 1.7–7.7)
Neutrophils Relative %: 86 %
Platelets: 353 10*3/uL (ref 150–400)
RBC: 2.04 MIL/uL — ABNORMAL LOW (ref 4.22–5.81)
RDW: 16.3 % — ABNORMAL HIGH (ref 11.5–15.5)
WBC: 18 10*3/uL — ABNORMAL HIGH (ref 4.0–10.5)
nRBC: 0 % (ref 0.0–0.2)

## 2020-11-24 LAB — BASIC METABOLIC PANEL
Anion gap: 12 (ref 5–15)
BUN: 37 mg/dL — ABNORMAL HIGH (ref 8–23)
CO2: 19 mmol/L — ABNORMAL LOW (ref 22–32)
Calcium: 8.6 mg/dL — ABNORMAL LOW (ref 8.9–10.3)
Chloride: 106 mmol/L (ref 98–111)
Creatinine, Ser: 1.56 mg/dL — ABNORMAL HIGH (ref 0.61–1.24)
GFR, Estimated: 42 mL/min — ABNORMAL LOW (ref >60)
Glucose, Bld: 131 mg/dL — ABNORMAL HIGH (ref 70–99)
Potassium: 4.1 mmol/L (ref 3.5–5.1)
Sodium: 137 mmol/L (ref 135–145)

## 2020-11-24 LAB — PREPARE RBC (CROSSMATCH)

## 2020-11-24 LAB — PROCALCITONIN: Procalcitonin: 0.52 ng/mL

## 2020-11-24 MED ORDER — VANCOMYCIN HCL 1500 MG/300ML IV SOLN: 1500.0000 mg | INTRAVENOUS | Status: DC

## 2020-11-24 MED ORDER — SODIUM CHLORIDE 0.9% IV SOLUTION: Freq: Once | INTRAVENOUS | Status: AC

## 2020-11-24 MED ORDER — GUAIFENESIN-DM 100-10 MG/5ML PO SYRP
5.0000 mL | ORAL_SOLUTION | ORAL | Status: DC
  Administered 2020-11-24 – 2020-12-08 (×68): 5 mL via ORAL
  Filled 2020-11-24 (×69): qty 5

## 2020-11-24 MED ORDER — FUROSEMIDE 10 MG/ML IJ SOLN: 20.0000 mg | Freq: Once | INTRAMUSCULAR | Status: DC

## 2020-11-24 MED ORDER — FUROSEMIDE 10 MG/ML IJ SOLN
20.0000 mg | Freq: Once | INTRAMUSCULAR | Status: AC
  Administered 2020-11-24: 20 mg via INTRAVENOUS
  Filled 2020-11-24: qty 2

## 2020-11-24 MED ORDER — DIPHENHYDRAMINE HCL 25 MG PO CAPS
25.0000 mg | ORAL_CAPSULE | Freq: Once | ORAL | Status: AC
  Administered 2020-11-24: 25 mg via ORAL
  Filled 2020-11-24: qty 1

## 2020-11-24 MED ORDER — ACETAMINOPHEN 325 MG PO TABS
650.0000 mg | ORAL_TABLET | Freq: Once | ORAL | Status: AC
Start: 2020-11-24 — End: 2020-11-24
  Administered 2020-11-24: 650 mg via ORAL
  Filled 2020-11-24: qty 2

## 2020-11-24 NOTE — Evaluation (Signed)
Speech Language Pathology Assessment and Plan  Patient Details  Name: Jon Reid MRN: [id number] Date of Birth: [birth year]  SLP Diagnosis: Dysphagia  Rehab Potential: Good ELOS: TBD pending results of MBS    Today's Date: 11/24/2020 SLP Individual Time: 1015-1040 SLP Individual Time Calculation (min): 25 min   Hospital Problem: Principal Problem:   Femur fracture, right ([place]) Active Problems:   S/P hip hemiarthroplasty   Pressure injury of skin   Closed fracture of right hip requiring operative repair, sequela  Past Medical History:  Past Medical History:  Diagnosis Date   Arthritis    Carcinoma of prostate ([place])    prostate   CKD (chronic kidney disease), stage III ([place])    Coronary artery disease    a. 10/2012 Cath: LM 20-30d, lAD 80p, 8m 80-90d, LCX 90-929mOM1 90, RCA 50p, 2031m0d, RPDA 90, EF nl; b. 10/2012 CABG x 4 (LIMA-LAD, SVG-OM1, SVG-PDA->RPL); c. 07/2015 MV: Basal and proximal septal infarct w/o ischemia. EF 57%-->low risk.   First degree AV block 01/28/2019   Noted on EKG   GERD (gastroesophageal reflux disease)    Gout    HTN (hypertension)    Macrocytosis without anemia 01/08/2012   Moderate aortic stenosis    a. 10/2012 Echo: EF 55-60%, no rwma, BAE, mild AI, mild TR; b. 06/2020 Echo: EF 50-55%, no rwma, mild LVH. Mod dil LA. Mild MR. Mod AS.   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   Sleep apnea    Questionable   Spinal stenosis    [facility]  Past Surgical History:  Past Surgical History:  Procedure Laterality Date   BALLOON DILATION N/A 10/03/2020   Procedure: BALLOON DILATION;  Surgeon: CarEloise HarmanO;  [facility];  Service: Endoscopy;  Laterality: N/A;   CARDIAC CATHETERIZATION  11/07/2012   Dr NahAcie FredricksonCATARACT EXTRACTION W/ INTRAOCULAR LENS IMPLANT Bilateral    CORONARY ARTERY BYPASS GRAFT N/A 11/11/2012   Procedure: CORONARY ARTERY BYPASS  GRAFTING times four using Right Greater Saphenous Vein Graft harvested endoscopically and Left Internal Mammary Artery.;  Surgeon: PetIvin PootD;  [facility] [place];  Laterality: N/A;   ESOPHAGEAL BRUSHING  10/03/2020   Procedure: ESOPHAGEAL BRUSHING;  Surgeon: CarEloise HarmanO;  [facility];  Service: Endoscopy;;   ESOPHAGOGASTRODUODENOSCOPY (EGD) WITH PROPOFOL N/A 06/27/2020   Surgeon: CarEloise HarmanO;  large hiatal hernia, LA grade D esophagitis with no bleeding, normal examined duodenum.    ESOPHAGOGASTRODUODENOSCOPY (EGD) WITH PROPOFOL N/A 10/03/2020   Procedure: ESOPHAGOGASTRODUODENOSCOPY (EGD) WITH PROPOFOL;  Surgeon: CarEloise HarmanO;  [facility];  Service: Endoscopy;  Laterality: N/A;  2:00pm   HIP ARTHROPLASTY Right 11/13/2020   Procedure: ARTHROPLASTY BIPOLAR HIP (HEMIARTHROPLASTY);  Surgeon: MurRenette ButtersD;  [facility] [place];  Laterality: Right;   INGUINAL HERNIA REPAIR Right 01/10/2017   Procedure: OPEN RIGHT HERNIA REPAIR INGUINAL;  Surgeon: WhiIleana RoupD;  [facility]: [facility];  Service: General;  Laterality: Right;   INSERTION OF MESH Right 01/10/2017   Procedure: INSERTION OF MESH;  Surgeon: WhiIleana RoupD;  [facility];  Service: General;  Laterality: Right;   INTRAOPERATIVE TRANSESOPHAGEAL ECHOCARDIOGRAM N/A 11/11/2012   Procedure: INTRAOPERATIVE TRANSESOPHAGEAL ECHOCARDIOGRAM;  Surgeon: PetIvin PootD;  [facility] [place];  Laterality: N/A;   LEFT HEART CATHETERIZATION WITH CORONARY ANGIOGRAM N/A 11/07/2012   Procedure: LEFT HEART CATHETERIZATION WITH CORONARY ANGIOGRAM;  Surgeon: Thayer Headings, MD;  [facility];  Service: Cardiovascular;  Laterality: N/A;   LUMBAR LAMINECTOMY/DECOMPRESSION MICRODISCECTOMY N/A 02/05/2019   Procedure: LUMBAR LAMINECTOMY/DECOMPRESSION L3-L4;  Surgeon: Latanya Maudlin, MD;  [facility]: [facility];  Service: Orthopedics;  Laterality: N/A;  47mn   LYMPH NODE DISSECTION     Bilateral pelvic   RETROPUBIC PROSTATECTOMY     Radical    Assessment / Plan / Recommendation Clinical Impression Patient referred to SLP from PT this AM (9/8) as patient having difficulty even swallowing liquids and complaining of tightness in throat, feeling of liquids and solids getting stuck. Patient does have long h/o GERD and Esophagram on 10/03/20 found large hiatal hernia. Patient was alert when SLP entered room however he appeared fatigued. He answered questions about his swallowing and reported he has not eaten or drank much in past day or two. SLP observed patient to have incident of coughing with audible secretions however no expectoration. Patient continues to c/o difficulty swallowing thin liquids (water). SLP discussed with MD and plan is for MBS next date to r/o aspiration and determine if oropharyngeal dysphagia.  Skilled Therapeutic Interventions          BSE  SLP Assessment  Patient will need skilled Speech Lanaguage Pathology Services during CIR admission    Recommendations  SLP Diet Recommendations: Thin;Age appropriate regular solids Liquid Administration via: Cup;Straw Medication Administration: Whole meds with liquid Postural Changes and/or Swallow Maneuvers: Seated upright 90 degrees;Upright 30-60 min after meal Oral Care Recommendations: Oral care BID Patient destination: Home Follow up Recommendations: Other (comment) (TBD) Equipment Recommended: None recommended by SLP    SLP Frequency     SLP Duration  SLP Intensity  SLP Treatment/Interventions TBD pending results of MBS   TBD    BSE, MBS planned   Pain Pain Assessment Pain Scale: 0-10 Faces Pain Scale: No hurt  Bedside Swallowing Assessment General Date of Onset: 11/24/20 Previous Swallow Assessment: BSE 11/11/20, EGD 10/03/20; large hiatal hernia Diet Prior to this Study: Regular;Thin liquids Temperature Spikes  Noted: No Respiratory Status: Room air History of Recent Intubation: No Behavior/Cognition: Alert;Cooperative;Pleasant mood;Lethargic/Drowsy Oral Cavity - Dentition: Adequate natural dentition Self-Feeding Abilities: Able to feed self Vision: Functional for self-feeding Patient Positioning: Upright in bed Baseline Vocal Quality: Normal Volitional Cough: Strong Volitional Swallow: Able to elicit  Oral Care Assessment   Ice Chips   Thin Liquid Thin Liquid: Impaired Presentation: Straw Pharyngeal  Phase Impairments: Other (comments) Other Comments: Patient complains of tightness around throat "like a ring". He had cough with audible secretions but no expectoration. Nectar Thick   Honey Thick   Puree Puree: Not tested Solid Solid: Not tested BSE Assessment Suspected Esophageal Findings Suspected Esophageal Findings: Globus sensation Risk for Aspiration Impact on safety and function: Mild aspiration risk Other Related Risk Factors: History of GERD;History of esophageal-related issues  Short Term Goals: Week 1: SLP Short Term Goal 1 (Week 1): Patient will participate in objective evaluation of swallow function to determine if any oropharyngeal dysphagia. SLP Short Term Goal 2 (Week 1): SLP to determine if further dysphagia tx warranted pending MBS>  Refer to Care Plan for Long Term Goals  Recommendations for other services: None   Discharge Criteria: Patient will be discharged from SLP if patient refuses treatment 3 consecutive times without medical reason, if treatment goals not met, if there  is a change in medical status, if patient makes no progress towards goals or if patient is discharged from hospital.  The above assessment, treatment plan, treatment alternatives and goals were discussed and mutually agreed upon: by patient  Dannial Monarch 11/24/2020, 2:29 PM

## 2020-11-24 NOTE — Progress Notes (Signed)
Physical Therapy Session Note  Patient Details  Name: Jon Reid MRN: [id number] Date of Birth: 10-05-1928  Today's Date: 11/24/2020 PT Individual Time: 0800-0858 PT Individual Time Calculation (min): 58 min    Short Term Goals: Week 1:  PT Short Term Goal 1 (Week 1): Pt will perform bed mobility with mod assist PT Short Term Goal 2 (Week 1): Pt will ambulate 90f with mod assist PT Short Term Goal 3 (Week 1): Pt will propell WC with supervision assist around room PT Short Term Goal 4 (Week 1): Pt will consistently transfer with mod assist to and from Bed  Skilled Therapeutic Interventions/Progress Updates:     1st session: Per chart review, pt with HbG of 6.4 with yellow MEWS score - spoke with RN and PA to approve therapy - requesting light activity and bed level until further tests/follow up. Pt sleeping supine in bed at start of session. Awakens easily to voice. Reports not feeling well, feeling "confused." He's oriented to self only. States "11" for year and says he "[place]." Turned on lights and opened blinds to assist reduce night/day confusion. Vitals taken, with BP reading 94/49 in supine position. Noted bed linen to be saturated from urine. NT called in for +2 assist for bed linen change. He also reports urge to urinate - provided urinal and pt continent of bladder. Assisted with brief [place]. Required +2 maxA for rolling in bed and for scooting up in the bed for repositioning. He completed supine there-ex as follows: -2x10 hip abduction, bilaterally (required AAROM for R LE) -2x25 ankle pumps, bilaterally -2x20 glut sets -2x10 heel slides, bilaterally (required AAROM for RLE) *rest breaks needed b/w sets and verbal/tactile cues needed for muscle activation, sequencing, and technique.  Bed placed in chair position to optimize upright tolerance and sitting posture for his breakfast. Pt took a sip of grape juice and had difficulty  swallowing, reporting the sensation of liquid getting "stuck." Concern relayed to SLP. Pt missed 17 minutes of skilled therapy due to fatigue.  2nd session: Per conversation with RN, being connected for blood transfusion. Spoke with PA provider who requested to hold therapy until transfusion complete. Pt missed 60 minutes of skilled therapy.   Therapy Documentation Precautions:  Precautions Precautions: Posterior Hip, Fall Precaution Comments: verbally reviewed Required Braces or Orthoses: Knee Immobilizer - Right Knee Immobilizer - Right:  (in bed only, off with therapies) Restrictions Weight Bearing Restrictions: No RLE Weight Bearing: Weight bearing as tolerated Other Position/Activity Restrictions: posterior hip precautions R LE General:    Therapy/Group: Individual Therapy  Zikeria Keough P Donavon Kimrey 11/24/2020, 7:28 AM

## 2020-11-24 NOTE — Progress Notes (Signed)
PROGRESS NOTE   Subjective/Complaints: Sleeping but easily arousable. Alert and oriented x2. Refuses breakfast. Discussed with Jeannene Patella and [name] SLP his difficulty swallowing. Plan for MBS tomorrow to assess for esophageal stricture.  Appears to be coughing less, CXR and WBC improved  ROS: +cough, +sputum production, +insomnia  Objective:   DG Chest 2 View  Result Date: 11/24/2020 CLINICAL DATA:  Follow-up cough, congestion EXAM: CHEST - 2 VIEW COMPARISON:  11/23/2020 FINDINGS: No significant change in examination with gross cardiomegaly and large hiatal hernia. Small bilateral pleural effusions and associated atelectasis or consolidation, unchanged. No new airspace opacity. Disc degenerative disease of the thoracic spine. IMPRESSION: No significant change in examination with gross cardiomegaly and large hiatal hernia. Small bilateral pleural effusions and associated atelectasis or consolidation, unchanged. No new airspace opacity. Electronically Signed   By: Eddie Candle M.D.   On: 11/24/2020 09:48   DG Chest 2 View  Result Date: 11/23/2020 CLINICAL DATA:  Cough, congestion EXAM: CHEST - 2 VIEW COMPARISON:  11/17/2020 FINDINGS: Post CABG. Stable heart size. Atherosclerotic calcification of the aortic knob. Large hiatal hernia. Streaky right basilar opacity which may reflect atelectasis. Persistent bandlike opacity at the left lung base, scarring or atelectasis. Small bilateral pleural effusions. No pneumothorax. IMPRESSION: 1. Small bilateral pleural effusions. 2. Streaky bibasilar opacities which may reflect atelectasis. 3. Large hiatal hernia. Electronically Signed   By: Davina Poke D.O.   On: 11/23/2020 11:16   Recent Labs    11/23/20 1019 11/24/20 0502  WBC 20.5* 18.0*  HGB 7.1* 6.4*  HCT 21.9* 20.1*  PLT 403* 353   Recent Labs    11/23/20 1019 11/24/20 0502  NA 136 137  K 4.2 4.1  CL 105 106  CO2 24 19*  GLUCOSE 134* 131*   BUN 36* 37*  CREATININE 1.37* 1.56*  CALCIUM 8.6* 8.6*    Intake/Output Summary (Last 24 hours) at 11/24/2020 1113 Last data filed at 11/24/2020 0750 Gross per 24 hour  Intake 510 ml  Output 300 ml  Net 210 ml     Pressure Injury 11/17/20 Sacrum Mid Stage 2 -  Partial thickness loss of dermis presenting as a shallow open injury with a red, pink wound bed without slough. round red shallow crater surrounded by denuded area with serous drainage (Active)  11/17/20 0315  Location: Sacrum  Location Orientation: Mid  Staging: Stage 2 -  Partial thickness loss of dermis presenting as a shallow open injury with a red, pink wound bed without slough.  Wound Description (Comments): round red shallow crater surrounded by denuded area with serous drainage  Present on Admission: Yes    Physical Exam: Vital Signs Blood pressure (!) 104/48, pulse 62, temperature 98.7 F (37.1 C), temperature source Oral, resp. rate 20, height 6' (1.829 m), weight 70.8 kg, SpO2 98 %. Gen: no distress, normal appearing HEENT: oral mucosa pink and moist, NCAT Cardio: Reg rate Chest: normal effort, normal rate of breathing Abd: soft, non-distended Ext: no edema Psych: pleasant, normal affect  Musculoskeletal:     Cervical back: Normal range of motion. No rigidity.     Comments: Right hip incision covered with steri-strips.  Moderate edema, no erythema seen.  UE 5-/5 B/L RLE- HF 3/5- in brace from mid thigh to R ankle- cannot test- knee; DF/PF 4-/5 LLE- 4-/5 Skin:    Comments: Bilateral forearms with multiple ecchymotic areas.  L forearm iv- looks ok- but pulling at skin Peeling skin on top of head with actinic keratosis.   Neurological:     Mental Status: He is alert and oriented to person, place, and time.     Comments: Decreased hearing. Speech clear. Able to follow simple commands without difficulty.  Intact to light touch x 4  Psychiatric:     Comments: Interactive; HOH    Assessment/Plan: 1.  Functional deficits which require 3+ hours per day of interdisciplinary therapy in a comprehensive inpatient rehab setting. Physiatrist is providing close team supervision and 24 hour management of active medical problems listed below. Physiatrist and rehab team continue to assess barriers to discharge/monitor patient progress toward functional and medical goals  Care Tool:  Bathing    Body parts bathed by patient: Right arm, Left arm, Chest, Abdomen, Front perineal area, Face, Right upper leg, Left upper leg   Body parts bathed by helper: Buttocks     Bathing assist Assist Level: Moderate Assistance - Patient 50 - 74%     Upper Body Dressing/Undressing Upper body dressing   What is the patient wearing?: Hospital gown only    Upper body assist Assist Level: Maximal Assistance - Patient 25 - 49%    Lower Body Dressing/Undressing Lower body dressing      What is the patient wearing?: Pants     Lower body assist Assist for lower body dressing: Maximal Assistance - Patient 25 - 49% (with reacher)     Toileting Toileting    Toileting assist Assist for toileting: Total Assistance - Patient < 25% (urinal)     Transfers Chair/bed transfer  Transfers assist     Chair/bed transfer assist level: Minimal Assistance - Patient > 75%     Locomotion Ambulation   Ambulation assist   Ambulation activity did not occur: Safety/medical concerns  Assist level: Moderate Assistance - Patient 50 - 74% Assistive device: Walker-rolling Max distance: 40'   Walk 10 feet activity   Assist  Walk 10 feet activity did not occur: Safety/medical concerns  Assist level: Moderate Assistance - Patient - 50 - 74% Assistive device: Walker-rolling   Walk 50 feet activity   Assist Walk 50 feet with 2 turns activity did not occur: Safety/medical concerns         Walk 150 feet activity   Assist Walk 150 feet activity did not occur: Safety/medical concerns         Walk 10  feet on uneven surface  activity   Assist Walk 10 feet on uneven surfaces activity did not occur: Safety/medical concerns         Wheelchair     Assist Is the patient using a wheelchair?: Yes Type of Wheelchair: Manual Wheelchair activity did not occur: Refused         Wheelchair 50 feet with 2 turns activity    Assist    Wheelchair 50 feet with 2 turns activity did not occur: Refused       Wheelchair 150 feet activity     Assist  Wheelchair 150 feet activity did not occur: Refused       Blood pressure (!) 104/48, pulse 62, temperature 98.7 F (37.1 C), temperature source Oral, resp. rate 20, height 6' (1.829 m), weight 70.8 kg, SpO2 98 %.  Medical Problem  List and Plan: 1.  R hip fx s/p hemiarthroplasty secondary to multiple falls             -patient may  shower if covered       -ELOS/Goals: 16-18 days- min Assist Continue CIR 2.  Antithrombotics: -DVT/anticoagulation:  Pharmaceutical: Continue Lovenox             -antiplatelet therapy: On ASA bid for DVT prophylaxis?-->decrease to once a day as on lovenox.  3. Pain: Decrease Norco to q12H prn for severe pain, tylenol prn.  4. Mood: LCSW to follow for evaluation and  support.              -antipsychotic agents: N/A 5. Neuropsych: This patient is not fully capable of making decisions on his own behalf. 6. Skin/Wound Care: Monitor wound for healing.  7. Fluids/Electrolytes/Nutrition: Monitor I/O. Check lytes in am. 8. Cough: New due to question aspiration event--Has been on Mucinex since 08/30-->+ Covid 8/26, afeb , WBC nl , CXR 9/1 low vol but unremarkable may d/c isolation, needs to wear mask when out of room. Increase Mucinex to 2 tabs BID.  9. GERD: Continue Protonix BID. Continue IV abx 10. Parkinson's disease: Managed with Sinemet IR TID 11. CAD s/p CABG: Monitor for symptoms with increase in activity. Continue ASA.              --hold lasix due to intermittent low BP.  12. Acute on chronic  anemia due to blood loss anemia: Hgb reviewed 9/2- decreased to 8.2, repeat 9/5 stable at 8.0             --resume iron supplement.  13. Constipation: Continue Senna S 14. Hypotension: medications reviewed, not on any medications for hypertension.  Vitals:   11/24/20 0816 11/24/20 1107  BP: (!) 94/49 (!) 104/48  Pulse: 62 62  Resp: 17 20  Temp: 99.1 F (37.3 C) 98.7 F (37.1 C)  SpO2: 98%     15. Hypokalemia: 15mq K+ supplement today, K+ normal on 9/5 16. Confusion: likely sundowning: check CXR, CBC, BMP, UA/UC to assess for infection.  17. Insomnia: replace trazodone with melatonin 18. Esophageal stricture suspected: MBS tomorrow.     LOS: 7 days A FACE TO FACE EVALUATION WAS PERFORMED  Errin Whitelaw P Aniceto Kyser 11/24/2020, 11:13 AM

## 2020-11-24 NOTE — Progress Notes (Signed)
Pharmacy Antibiotic Note  Jon Reid is a 87 y.o. male admitted on 11/17/2020  after [facility] hospitalization on 8/26 for right hip fracture after a fall  at home.  S/p R THR on 11/13/20.  Pharmacy consulted 9/7 for Vancomycin and Zosyn dosing for aspiration pneumonia.  Day #2 Vanc/Zosyn,  Scr increased to 1.56, CrCl ~ 31.5 ml/min.   Vancomycin dose will be adjusted due to change decreased renal function.   MRSA PCR is negative  Urine culture grew proteus mirabilis, sensitivities are pending.  Consider DC vancomycin and narrow abx.   Plan: Continue Zosyn 3.375 g IV q8h  (EI)  Adjust Vancomycin to 1500 mg IV Q 48 hrs.  Goal AUC 400-550. Expected AUC: 445 SCr used: 1.56  (dose adjusted 9/8) Monitor clinical progress, renal function. Check steady state vancomycin  levels per protocol if needed.  BMET daily x 5 days per vanc protocol to f/u SCr in 87 y.o on vanc.  Height: 6' (182.9 cm) Weight: 70.8 kg (156 lb 1.4 oz) IBW/kg (Calculated) : 77.6  Temp (24hrs), Avg:99.1 F (37.3 C), Min:97.8 F (36.6 C), Max:101 F (38.3 C)  Recent Labs  Lab 11/19/20 0501 11/21/20 0535 11/23/20 1019 11/23/20 1843 11/23/20 2225 11/24/20 0502  WBC 9.6 7.0 20.5*  --   --  18.0*  CREATININE 1.36* 1.23 1.37*  --   --  1.56*  LATICACIDVEN  --   --   --  2.6* 1.9  --      Estimated Creatinine Clearance: 31.5 mL/min (A) (by C-G formula based on SCr of 1.56 mg/dL (H)).    Allergies  Allergen Reactions   Crestor [Rosuvastatin] Swelling    GYNECOMASTIA     Antimicrobials this admission: Vanc 9/7>> Zosyn 9/7>>  Dose adjustments this admission:  SCr up to 1.56>  adjusted vancomycin dose to 1500 mg IV q48hr, next due 11/25/20 @ 18:00   Microbiology results: 9/7 UCx: 20k col/mol Proteus mirabilis, sens pending 8/26  MRSA PCR: neg;  SA: neg  Thank you for allowing pharmacy to be a part of this patient's care.  Nicole Cella, [place] Clinical Pharmacist [phone] 11/24/2020 12:07 PM

## 2020-11-24 NOTE — Progress Notes (Signed)
Critical Hgb of 6.4, [place]  notified, orders given for type and cross and 2 units of RBC's

## 2020-11-24 NOTE — Progress Notes (Signed)
Follow-up labs this a.m. with hemoglobin 6.4 down from 7.1.  We will type and cross transfuse 2 units.  We will hold Lovenox for now.  Leukocytosis 18,000 improved from 20,500 remains on broad antibiotic coverage.  Procalcitonin level within normal limits.  Lactic acid decreased to 1.9 from 2.6.

## 2020-11-25 ENCOUNTER — Inpatient Hospital Stay (HOSPITAL_COMMUNITY): Payer: MEDICARE

## 2020-11-25 LAB — BPAM RBC
Blood Product Expiration Date: 202210092359
Blood Product Expiration Date: 202210092359
ISSUE DATE / TIME: 202209081108
ISSUE DATE / TIME: 202209081503
Unit Type and Rh: 5100
Unit Type and Rh: 5100

## 2020-11-25 LAB — CBC WITH DIFFERENTIAL/PLATELET
Abs Immature Granulocytes: 0.05 10*3/uL (ref 0.00–0.07)
Basophils Absolute: 0 10*3/uL (ref 0.0–0.1)
Basophils Relative: 0 %
Eosinophils Absolute: 0 10*3/uL (ref 0.0–0.5)
Eosinophils Relative: 0 %
HCT: 23.8 % — ABNORMAL LOW (ref 39.0–52.0)
Hemoglobin: 7.9 g/dL — ABNORMAL LOW (ref 13.0–17.0)
Immature Granulocytes: 1 %
Lymphocytes Relative: 12 %
Lymphs Abs: 1.1 10*3/uL (ref 0.7–4.0)
MCH: 30.9 pg (ref 26.0–34.0)
MCHC: 33.2 g/dL (ref 30.0–36.0)
MCV: 93 fL (ref 80.0–100.0)
Monocytes Absolute: 0.6 10*3/uL (ref 0.1–1.0)
Monocytes Relative: 7 %
Neutro Abs: 7.3 10*3/uL (ref 1.7–7.7)
Neutrophils Relative %: 80 %
Platelets: 323 10*3/uL (ref 150–400)
RBC: 2.56 MIL/uL — ABNORMAL LOW (ref 4.22–5.81)
RDW: 16.8 % — ABNORMAL HIGH (ref 11.5–15.5)
WBC: 9.1 10*3/uL (ref 4.0–10.5)
nRBC: 0 % (ref 0.0–0.2)

## 2020-11-25 LAB — BASIC METABOLIC PANEL
Anion gap: 11 (ref 5–15)
BUN: 30 mg/dL — ABNORMAL HIGH (ref 8–23)
CO2: 22 mmol/L (ref 22–32)
Calcium: 8.4 mg/dL — ABNORMAL LOW (ref 8.9–10.3)
Chloride: 104 mmol/L (ref 98–111)
Creatinine, Ser: 1.36 mg/dL — ABNORMAL HIGH (ref 0.61–1.24)
GFR, Estimated: 49 mL/min — ABNORMAL LOW (ref >60)
Glucose, Bld: 102 mg/dL — ABNORMAL HIGH (ref 70–99)
Potassium: 3.2 mmol/L — ABNORMAL LOW (ref 3.5–5.1)
Sodium: 137 mmol/L (ref 135–145)

## 2020-11-25 LAB — URINE CULTURE: Culture: 20000 — AB

## 2020-11-25 LAB — TYPE AND SCREEN
ABO/RH(D): O POS
Antibody Screen: NEGATIVE
Unit division: 0
Unit division: 0

## 2020-11-25 LAB — PROCALCITONIN: Procalcitonin: 0.36 ng/mL

## 2020-11-25 MED ORDER — POTASSIUM CHLORIDE 20 MEQ PO PACK
20.0000 meq | PACK | Freq: Two times a day (BID) | ORAL | Status: DC
  Administered 2020-11-25 – 2020-11-26 (×3): 20 meq via ORAL
  Filled 2020-11-25 (×3): qty 1

## 2020-11-25 MED ORDER — PIPERACILLIN-TAZOBACTAM 3.375 G IVPB
3.3750 g | Freq: Three times a day (TID) | INTRAVENOUS | Status: DC
  Administered 2020-11-25 – 2020-11-29 (×12): 3.375 g via INTRAVENOUS
  Filled 2020-11-25 (×13): qty 50

## 2020-11-25 MED ORDER — VANCOMYCIN HCL IN DEXTROSE 1-5 GM/200ML-% IV SOLN
1000.0000 mg | INTRAVENOUS | Status: DC
  Administered 2020-11-25 – 2020-11-28 (×4): 1000 mg via INTRAVENOUS
  Filled 2020-11-25 (×5): qty 200

## 2020-11-25 NOTE — Progress Notes (Signed)
Physical Therapy Session Note  Patient Details  Name: Jon Reid MRN: [id number] Date of Birth: Jul 31, 1928  Today's Date: 11/25/2020 PT Individual Time: 1415-1515 PT Individual Time Calculation (min): 60 min   Short Term Goals: Week 1:  PT Short Term Goal 1 (Week 1): Pt will perform bed mobility with mod assist PT Short Term Goal 1 - Progress (Week 1): Progressing toward goal PT Short Term Goal 2 (Week 1): Pt will ambulate 41f with mod assist PT Short Term Goal 2 - Progress (Week 1): Met PT Short Term Goal 3 (Week 1): Pt will propell WC with supervision assist around room PT Short Term Goal 3 - Progress (Week 1): Not met PT Short Term Goal 4 (Week 1): Pt will consistently transfer with mod assist to and from Bed PT Short Term Goal 4 - Progress (Week 1): Met Week 2:  PT Short Term Goal 1 (Week 2): Pt will complete bed mobility at minA level PT Short Term Goal 2 (Week 2): Pt will complete bed<>chair transfers with minA and LRAD PT Short Term Goal 3 (Week 2): Pt will ambulate 554fwith minA and LRAD Week 3:     Skilled Therapeutic Interventions/Progress Updates:    Pt initially oob in TIS wc, resting but easily wakened and agreeable to session.   Transported to gym for session. Seated BP 110/55 (wearing TEDs) Sit to stand from wc w/min assist, very slow transition, stooped posture.  Gait 3619f/RW, cga, shuffling gait, very slow cadence, cues for turning and fully backing to mat for transition to sitting which also requires cues and additional time.  Seated therex: LAQs, heel raises, toe raise Standing therex w/RW w/RLE: Hip flexion, hip abduction.  Seated rest between sets of 20.   Sit to stand repeated mult times during session w/cues for scooting to edge of surface, cues to utilize slight momentum, min assist.  Gait [id number] wc as above, less cueing required for safety w/turning this pass, overall cga.  Pt left oob in wc w/mild tilt for safety w/alarm belt set and needs in  reach   Therapy Documentation Precautions:  Precautions Precautions: Posterior Hip, Fall Precaution Comments: verbally reviewed Required Braces or Orthoses: Knee Immobilizer - Right Knee Immobilizer - Right:  (in bed only, off with therapies) Restrictions Weight Bearing Restrictions: No RLE Weight Bearing: Weight bearing as tolerated Other Position/Activity Restrictions: posterior hip precautions R LE    Therapy/Group: Individual Therapy  BarbJerrilyn Cairo/2022, 4:18 PM

## 2020-11-25 NOTE — Progress Notes (Signed)
PROGRESS NOTE   WBC normalized He expresses no new complaints today- just back from MBS- likely chronic pharyngeal dysphagia as per [name]  ROS: +cough, +sputum production, +insomnia, + hip pain  Objective:   DG Chest 2 View  Result Date: 11/24/2020 CLINICAL DATA:  Follow-up cough, congestion EXAM: CHEST - 2 VIEW COMPARISON:  11/23/2020 FINDINGS: No significant change in examination with gross cardiomegaly and large hiatal hernia. Small bilateral pleural effusions and associated atelectasis or consolidation, unchanged. No new airspace opacity. Disc degenerative disease of the thoracic spine. IMPRESSION: No significant change in examination with gross cardiomegaly and large hiatal hernia. Small bilateral pleural effusions and associated atelectasis or consolidation, unchanged. No new airspace opacity. Electronically Signed   By: Eddie Candle M.D.   On: 11/24/2020 09:48   Recent Labs    11/24/20 0502 11/25/20 0458  WBC 18.0* 9.1  HGB 6.4* 7.9*  HCT 20.1* 23.8*  PLT 353 323   Recent Labs    11/24/20 0502 11/25/20 0458  NA 137 137  K 4.1 3.2*  CL 106 104  CO2 19* 22  GLUCOSE 131* 102*  BUN 37* 30*  CREATININE 1.56* 1.36*  CALCIUM 8.6* 8.4*    Intake/Output Summary (Last 24 hours) at 11/25/2020 1359 Last data filed at 11/25/2020 0431 Gross per 24 hour  Intake 902 ml  Output 775 ml  Net 127 ml     Pressure Injury 11/17/20 Sacrum Mid Stage 2 -  Partial thickness loss of dermis presenting as a shallow open injury with a red, pink wound bed without slough. round red shallow crater surrounded by denuded area with serous drainage (Active)  11/17/20 0315  Location: Sacrum  Location Orientation: Mid  Staging: Stage 2 -  Partial thickness loss of dermis presenting as a shallow open injury with a red, pink wound bed without slough.  Wound Description (Comments): round red shallow crater surrounded by denuded area with serous drainage   Present on Admission: Yes    Physical Exam: Vital Signs Blood pressure (!) 100/52, pulse (!) 53, temperature 98.4 F (36.9 C), temperature source Oral, resp. rate 16, height 6' (1.829 m), weight 67.9 kg, SpO2 93 %. Gen: no distress, normal appearing HEENT: oral mucosa pink and moist, NCAT Cardio: Bradycardic Chest: normal effort, normal rate of breathing Abd: soft, non-distended Ext: no edema Psych: pleasant, normal affect  Musculoskeletal:     Cervical back: Normal range of motion. No rigidity.     Comments: Right hip incision covered with steri-strips.  Moderate edema, no erythema seen.  UE 5-/5 B/L RLE- HF 3/5- in brace from mid thigh to R ankle- cannot test- knee; DF/PF 4-/5 LLE- 4-/5 Skin:    Comments: Bilateral forearms with multiple ecchymotic areas.  L forearm iv- looks ok- but pulling at skin Peeling skin on top of head with actinic keratosis.   Neurological:     Mental Status: He is alert and oriented to person, place, and time.     Comments: Decreased hearing. Speech clear. Able to follow simple commands without difficulty.  Intact to light touch x 4  Psychiatric:     Comments: Interactive; HOH    Assessment/Plan: 1. Functional deficits which require  3+ hours per day of interdisciplinary therapy in a comprehensive inpatient rehab setting. Physiatrist is providing close team supervision and 24 hour management of active medical problems listed below. Physiatrist and rehab team continue to assess barriers to discharge/monitor patient progress toward functional and medical goals  Care Tool:  Bathing    Body parts bathed by patient: Right arm, Left arm, Chest, Abdomen, Front perineal area, Face, Right upper leg, Left upper leg   Body parts bathed by helper: Buttocks     Bathing assist Assist Level: Moderate Assistance - Patient 50 - 74%     Upper Body Dressing/Undressing Upper body dressing   What is the patient wearing?: Hospital gown only    Upper body  assist Assist Level: Maximal Assistance - Patient 25 - 49%    Lower Body Dressing/Undressing Lower body dressing      What is the patient wearing?: Pants     Lower body assist Assist for lower body dressing: Maximal Assistance - Patient 25 - 49% (with reacher)     Toileting Toileting    Toileting assist Assist for toileting: Total Assistance - Patient < 25% (urinal)     Transfers Chair/bed transfer  Transfers assist     Chair/bed transfer assist level: Minimal Assistance - Patient > 75%     Locomotion Ambulation   Ambulation assist   Ambulation activity did not occur: Safety/medical concerns  Assist level: Moderate Assistance - Patient 50 - 74% Assistive device: Walker-rolling Max distance: 40'   Walk 10 feet activity   Assist  Walk 10 feet activity did not occur: Safety/medical concerns  Assist level: Moderate Assistance - Patient - 50 - 74% Assistive device: Walker-rolling   Walk 50 feet activity   Assist Walk 50 feet with 2 turns activity did not occur: Safety/medical concerns         Walk 150 feet activity   Assist Walk 150 feet activity did not occur: Safety/medical concerns         Walk 10 feet on uneven surface  activity   Assist Walk 10 feet on uneven surfaces activity did not occur: Safety/medical concerns         Wheelchair     Assist Is the patient using a wheelchair?: Yes Type of Wheelchair: Manual Wheelchair activity did not occur: Refused         Wheelchair 50 feet with 2 turns activity    Assist    Wheelchair 50 feet with 2 turns activity did not occur: Refused       Wheelchair 150 feet activity     Assist  Wheelchair 150 feet activity did not occur: Refused       Blood pressure (!) 100/52, pulse (!) 53, temperature 98.4 F (36.9 C), temperature source Oral, resp. rate 16, height 6' (1.829 m), weight 67.9 kg, SpO2 93 %.  Medical Problem List and Plan: 1.  R hip fx s/p hemiarthroplasty  secondary to multiple falls             -patient may  shower if covered       -ELOS/Goals: 16-18 days- min Assist Continue CIR 2.  Antithrombotics: -DVT/anticoagulation:  Pharmaceutical: Continue Lovenox             -antiplatelet therapy: On ASA bid for DVT prophylaxis?-->decrease to once a day as on lovenox.  3. Pain: Decrease Norco to q12H prn for severe pain, tylenol prn.  4. Mood: LCSW to follow for evaluation and  support.              -  antipsychotic agents: N/A 5. Neuropsych: This patient is not fully capable of making decisions on his own behalf. 6. Skin/Wound Care: Monitor wound for healing.  7. Fluids/Electrolytes/Nutrition: Monitor I/O. Check lytes in am. 8. Cough: New due to question aspiration event--Has been on Mucinex since 08/30-->+ Covid 8/26, afeb , WBC nl , CXR 9/1 low vol but unremarkable may d/c isolation, needs to wear mask when out of room. Increase Mucinex to 2 tabs BID.  9. GERD: Continue Protonix BID. Continue IV abx 10. Parkinson's disease: Managed with Sinemet IR TID 11. CAD s/p CABG: Monitor for symptoms with increase in activity. Continue ASA.              --hold lasix due to intermittent low BP.  12. Acute on chronic anemia due to blood loss anemia: Hgb reviewed 9/2- decreased to 8.2, repeat 9/5 stable at 8.0             --resume iron supplement.  13. Constipation: Continue Senna S 14. Hypotension: medications reviewed, not on any medications for hypertension.  Vitals:   11/24/20 2010 11/25/20 0444  BP: (!) 114/56 (!) 100/52  Pulse: 66 (!) 53  Resp: 20 16  Temp: 98.5 F (36.9 C) 98.4 F (36.9 C)  SpO2: 93% 93%    15. Hypokalemia: continue 1mq BID 16. Aspiration pneumonia: continue IV vanc and zosyn, WBC normalized and confusion resolved 17. Insomnia: replace trazodone with melatonin given hallucinations with former.  18. Pharyngeal dysphagia: s/p MBS 9/9, continue inpatient SLP.     LOS: 8 days A FACE TO FACE EVALUATION WAS PGilmer9/11/2020, 1:59 PM

## 2020-11-25 NOTE — Progress Notes (Signed)
Occupational Therapy Session Note  Patient Details  Name: Jon Reid MRN: [id number] Date of Birth: 06/05/1928  Today's Date: 11/25/2020 OT Individual Time: 1303-1400 OT Individual Time Calculation (min): 57 min    Short Term Goals: Week 1:  OT Short Term Goal 1 (Week 1): Pt will sit to stand from EOB to RW with min A. OT Short Term Goal 2 (Week 1): Pt will be able to stand at RW with S while therapist A him with managing clothing over hips. OT Short Term Goal 3 (Week 1): Pt will be able to don pants over feet with reacher with min A. OT Short Term Goal 4 (Week 1): Pt will be able to self cleanse post toileting with mod A.  Skilled Therapeutic Interventions/Progress Updates:    Pt greeted seated in TIS wc and agreeable to OT treatment session. Pt reported much improved from yesterday. Worked on changing gown seated in wc with Min A 2/2 IV running in R arm. Pt declined further BADL participation. Pt brought to therapy gym and worked on anterior weight shift to facilitate improved sit<>stands. Utilized NDT techniques to facilitate posterior pelvic tilt and power up through LE's. Partial sit<>stands to break down normal movement pattern, progressing to full stand. Standing balance/endurance with standing peg board activity. Pt tolerated standing for  2minute intervals. Pt returned to room and left seated in TIS wc with alarm belt on, call bell in reach, and needs met.   Therapy Documentation Precautions:  Precautions Precautions: Posterior Hip, Fall Precaution Comments: verbally reviewed Required Braces or Orthoses: Knee Immobilizer - Right Knee Immobilizer - Right:  (in bed only, off with therapies) Restrictions Weight Bearing Restrictions: No RLE Weight Bearing: Weight bearing as tolerated Other Position/Activity Restrictions: posterior hip precautions R LE Pain: Pain Assessment Pain Scale: 0-10 Pain Score: 0-No pain   Therapy/Group: Individual Therapy  Elisabeth S  Doe 11/25/2020, 1:39 PM 

## 2020-11-25 NOTE — Progress Notes (Signed)
Modified Barium Swallow Progress Note  Patient Details  Name: Jon Reid MRN: [id number] Date of Birth: 1928/10/14  Today's Date: 11/25/2020  Modified Barium Swallow completed.  Full report located under Chart Review in the Imaging Section.  Brief recommendations include the following:  Clinical Impression  Patient presents with a mild-moderate oropharyngeal dysphagia with likely esophageal component secondary to h/o large hiatal hernia. Oral phase consisted of delays in anterior to posterior transit with puree and mechanical soft solids as well as decreased bolus cohesion with barium tablet in thin liquid barium. No significant amount of residuals in oral cavity post initial swallows. During pharyngeal phase of swallow, patient exhibited trace penetration during swallow(PAS 3) and one instance of silent aspiration (PAS 8) with thin liquid barium. Aspiration occured during cued use of chin tuck posture but not when patient's head in neutral position. He exhibited mild-moderate vallecular and pyriform sinus residuals with puree and mechanical soft solids, and trace vallecular residuals and mild pyriform residuals with thin liquids. Although nectar thick liquids did not result in any aspiration or penetraiton, it did cause increased pharyngeal residuals. Patient exhibited presence of cervical osteophytes (no radiologist present to confirm) which appeared to be restricting esophageal opening. UES opening was reduced leading to pharyngeal residuals in pyriform sinuses and just above UES. SLP suspects that patient's dysphagia is likely chronic and may be exacerbated somewhat by his debility.   Swallow Evaluation Recommendations       SLP Diet Recommendations: Regular solids;Thin liquid   Liquid Administration via: Straw;Cup   Medication Administration: Whole meds with puree   Supervision: Patient able to self feed   Compensations: Minimize environmental distractions;Slow rate;Small  sips/bites   Postural Changes: Seated upright at 90 degrees;Remain semi-upright after after feeds/meals (Comment) (sit upright at least 30 minutes after meals)   Oral Care Recommendations: Oral care BID       Sonia Baller, MA, CCC-SLP Speech Therapy

## 2020-11-25 NOTE — Progress Notes (Signed)
Initial Nutrition Assessment  DOCUMENTATION CODES:   Severe malnutrition in context of chronic illness  INTERVENTION:  Provide Ensure Enlive po TID, each supplement provides 350 kcal and 20 grams of protein.  Encourage adequate PO intake.  NUTRITION DIAGNOSIS:   Severe Malnutrition related to chronic illness as evidenced by severe fat depletion, severe muscle depletion.  GOAL:   Patient will meet greater than or equal to 90% of their needs  MONITOR:   PO intake, Supplement acceptance, Skin, Weight trends, Labs, I & O's  REASON FOR ASSESSMENT:   Malnutrition Screening Tool    ASSESSMENT:   87  year old male with history of prostate CA, CKD, HTN, CAD, Parkinson's disease, spinal stenosis who was admitted on 11/11/20 after a fall onto right hip with subsequent right subcapital hip fracture. Therapy is ongoing and patient continues to be limited by confusion with mumbling speech at times, shuffling gait with intermittent freezing episodes, posterior lean, inability to maintain hip precautions as well as poor awareness of deficits.  CIR was recommended due to functional decline.  Pt underwent MBS today. Pt with mild-moderate oropharyngeal dysphagia with likely esophageal component secondary to history of large hiatal hernia. Meal completion has been varied from 0-50%. Pt currently has Ensure ordered and has been consuming them. RD to continue with current orders to aid in caloric and protein needs. Pt encouraged to eat his food at meal and to drink his supplements.   NUTRITION - FOCUSED PHYSICAL EXAM:  Flowsheet Row Most Recent Value  Orbital Region Moderate depletion  Upper Arm Region Severe depletion  Thoracic and Lumbar Region Severe depletion  Buccal Region Unable to assess  Temple Region Severe depletion  Clavicle Bone Region Severe depletion  Clavicle and Acromion Bone Region Severe depletion  Scapular Bone Region Unable to assess  Dorsal Hand Unable to assess  Patellar  Region Moderate depletion  Anterior Thigh Region Moderate depletion  Posterior Calf Region Moderate depletion  Edema (RD Assessment) Mild  Hair Reviewed  Eyes Reviewed  Mouth Reviewed  Skin Reviewed  Nails Reviewed      Labs and medications reviewed.   Diet Order:   Diet Order             Diet Heart Room service appropriate? Yes; Fluid consistency: Thin  Diet effective now                   EDUCATION NEEDS:   Not appropriate for education at this time  Skin:  Skin Assessment: Skin Integrity Issues: Skin Integrity Issues:: Stage II Stage II: sacrum  Last BM:  9/4  Height:   Ht Readings from Last 1 Encounters:  11/18/20 6' (1.829 m)    Weight:   Wt Readings from Last 1 Encounters:  11/25/20 67.9 kg   BMI:  Body mass index is 20.3 kg/m.  Estimated Nutritional Needs:   Kcal:  1700-1900  Protein:  80-90 grams  Fluid:  >/= 1.7 L/day  Corrin Parker, MS, RD, LDN RD pager number/after hours weekend pager number on Amion.

## 2020-11-25 NOTE — Progress Notes (Addendum)
Pharmacy Antibiotic Note  Jon Reid is a 87 y.o. male admitted on 11/17/2020  after [facility] hospitalization on 8/26 for right hip fracture after a fall  at home.  S/p R THR on 11/13/20.  Pharmacy has been consulted for Vancomycin and Zosyn dosing for aspiration pneumonia.  WBC trending down, wnl today afebrile.   Scr down to 1.36 today.   Plan: Continue Zosyn 3.375 g IV q8h  (extended 4 hr infusion)  Adjust Vancomycin dose back to 1000 mg IV Q 24 hrs. Goal AUC 400-550. Expected AUC: 548  (est peak 34.6, trough 15) SCr used: 1.36 Monitor clinical progress, renal function. Check steady state vancomycin  levels per protocol if needed.    Height: 6' (182.9 cm) Weight: 67.9 kg (149 lb 11.1 oz) IBW/kg (Calculated) : 77.6  Temp (24hrs), Avg:98.5 F (36.9 C), Min:98 F (36.7 C), Max:99 F (37.2 C)  Recent Labs  Lab 11/19/20 0501 11/21/20 0535 11/23/20 1019 11/23/20 1843 11/23/20 2225 11/24/20 0502 11/25/20 0458  WBC 9.6 7.0 20.5*  --   --  18.0* 9.1  CREATININE 1.36* 1.23 1.37*  --   --  1.56* 1.36*  LATICACIDVEN  --   --   --  2.6* 1.9  --   --      Estimated Creatinine Clearance: 34.7 mL/min (A) (by C-G formula based on SCr of 1.36 mg/dL (H)).    Allergies  Allergen Reactions   Crestor [Rosuvastatin] Swelling    GYNECOMASTIA     Antimicrobials this admission: Vanc 9/7>> Zosyn 9/7>>  Dose adjustments this admission: 9/8 SCr up to 1.56, adjusted vanc to 1500 mg IV q48hr 9/9 scr 1.36,  chgd back to '1000mg'$  q24h  Microbiology results: 9/7 UCx:  8/26  MRSA PCR: neg;  SA: neg  Thank you for allowing pharmacy to be a part of this patient's care.  Nicole Cella, [place] Clinical Pharmacist [phone] 11/25/2020 10:48 AM

## 2020-11-25 NOTE — Progress Notes (Signed)
Physical Therapy Weekly Progress Note  Patient Details  Name: Jon Reid MRN: [id number] Date of Birth: 1928-05-07  Beginning of progress report period: November 18, 2020 End of progress report period: November 25, 2020  Today's Date: 11/25/2020 PT Individual Time: 1000-1100 PT Individual Time Calculation (min): 60 min   Patient has met 2 of 4 short term goals. Pt is making slow progress towards goals. He has been fluctuating daily in his level of energy/fatigue and this has been impacting his ability to participate in intensive therapies. He also was restricted for the first few days from in-room therapy due to COVID precautions and then recently from medical complications (HbG 6.4 on 9/8) with suspected aspiration pneumonia. Overall, he is able to complete bed mobility at [facility] level, requires modA for stand<>pivot transfers with RW, and has been ambulating ~20-60f with min/modA and RW. He is primarily limited by R hip pain, global deconditioning, poor standing balance, decreased activity tolerance, age-related morbidity, and premorbid parkinson's disease.   Patient continues to demonstrate the following deficits muscle weakness and muscle joint tightness, decreased cardiorespiratoy endurance, decreased problem solving, decreased safety awareness, decreased memory, and delayed processing, and decreased standing balance, decreased postural control, decreased balance strategies, and difficulty maintaining precautions and therefore will continue to benefit from skilled PT intervention to increase functional independence with mobility.  Patient progressing toward long term goals..  Continue plan of care. Goals were upgraded to supervision level on 9/6  PT Short Term Goals Week 1:  PT Short Term Goal 1 (Week 1): Pt will perform bed mobility with mod assist PT Short Term Goal 1 - Progress (Week 1): Progressing toward goal PT Short Term Goal 2 (Week 1): Pt will ambulate 245fwith mod assist PT  Short Term Goal 2 - Progress (Week 1): Met PT Short Term Goal 3 (Week 1): Pt will propell WC with supervision assist around room PT Short Term Goal 3 - Progress (Week 1): Not met PT Short Term Goal 4 (Week 1): Pt will consistently transfer with mod assist to and from Bed PT Short Term Goal 4 - Progress (Week 1): Met Week 2:  PT Short Term Goal 1 (Week 2): Pt will complete bed mobility at minA level PT Short Term Goal 2 (Week 2): Pt will complete bed<>chair transfers with minA and LRAD PT Short Term Goal 3 (Week 2): Pt will ambulate 5063fith minA and LRAD  Skilled Therapeutic Interventions/Progress Updates:     Spoke with PA to clarify PT orders as pt was bed level only yesterday, now he is clear to mobilize as tolerated OOB. Upon arrival, pt sleeping soundly but awakens to voice. He's agreeable to therapy but reports urgency to void. Due to urgency, provided urinal and pt was continent of bladder. Pt able to complete supine<>sit with modA and use of bed features - pain limiting in RLE and difficulty with initiating movement/mobility in general. Able to sit EOB with supervision and provided RW for sit<>stand which he required modA to power to rise. Once standing, he did complain of some dizziness. BP taken:  Sitting: 110/59 Standing: 89/54 *symptomatic.  RN notified and retrieved knee-high TED's for compression to assist with BP management. These were donned with totalA and then pt completed ambulatory transfer from EOB to TIS w/c with modA for sit<>stand and minA for gait. He struggles greatly with turning to sit and safety approach in general to seated surfaces - requires step-by-step cueing for task. Pt then wheeled to day room rehab gym  for time. He was able to stand from TIS w/c with minA with cues for hand placement to arm rests rather than RW. He was able to ambulate ~30f with minA and RW while walking a straight path - anticipate increased difficulty with turns or navigating obstacles. He  required max verbal cues for turning to sit into arm chair. After brief rest break, assisted back to his TIS w/c via stand<>pivot with modA for standing and minA for balance with max verbal cues for steps. Pt then returned to his room and remained seated (reclined slightly) in TIS w/c with all needs in reach and safety alarm belt on.   Therapy Documentation Precautions:  Precautions Precautions: Posterior Hip, Fall Precaution Comments: verbally reviewed Required Braces or Orthoses: Knee Immobilizer - Right Knee Immobilizer - Right:  (in bed only, off with therapies) Restrictions Weight Bearing Restrictions: No RLE Weight Bearing: Weight bearing as tolerated Other Position/Activity Restrictions: posterior hip precautions R LE General:    Therapy/Group: Individual Therapy  CAlger Simons9/11/2020, 7:42 AM

## 2020-11-26 DIAGNOSIS — E43 Unspecified severe protein-calorie malnutrition: Secondary | ICD-10-CM

## 2020-11-26 DIAGNOSIS — R1312 Dysphagia, oropharyngeal phase: Secondary | ICD-10-CM

## 2020-11-26 DIAGNOSIS — E876 Hypokalemia: Secondary | ICD-10-CM

## 2020-11-26 LAB — CBC WITH DIFFERENTIAL/PLATELET
Abs Immature Granulocytes: 0.05 10*3/uL (ref 0.00–0.07)
Basophils Absolute: 0 10*3/uL (ref 0.0–0.1)
Basophils Relative: 0 %
Eosinophils Absolute: 0.1 10*3/uL (ref 0.0–0.5)
Eosinophils Relative: 1 %
HCT: 24.6 % — ABNORMAL LOW (ref 39.0–52.0)
Hemoglobin: 8.1 g/dL — ABNORMAL LOW (ref 13.0–17.0)
Immature Granulocytes: 1 %
Lymphocytes Relative: 16 %
Lymphs Abs: 1.2 10*3/uL (ref 0.7–4.0)
MCH: 30.8 pg (ref 26.0–34.0)
MCHC: 32.9 g/dL (ref 30.0–36.0)
MCV: 93.5 fL (ref 80.0–100.0)
Monocytes Absolute: 0.6 10*3/uL (ref 0.1–1.0)
Monocytes Relative: 9 %
Neutro Abs: 5.5 10*3/uL (ref 1.7–7.7)
Neutrophils Relative %: 73 %
Platelets: 389 10*3/uL (ref 150–400)
RBC: 2.63 MIL/uL — ABNORMAL LOW (ref 4.22–5.81)
RDW: 16.8 % — ABNORMAL HIGH (ref 11.5–15.5)
WBC: 7.6 10*3/uL (ref 4.0–10.5)
nRBC: 0 % (ref 0.0–0.2)

## 2020-11-26 LAB — BASIC METABOLIC PANEL
Anion gap: 7 (ref 5–15)
BUN: 24 mg/dL — ABNORMAL HIGH (ref 8–23)
CO2: 21 mmol/L — ABNORMAL LOW (ref 22–32)
Calcium: 8 mg/dL — ABNORMAL LOW (ref 8.9–10.3)
Chloride: 108 mmol/L (ref 98–111)
Creatinine, Ser: 1.31 mg/dL — ABNORMAL HIGH (ref 0.61–1.24)
GFR, Estimated: 52 mL/min — ABNORMAL LOW (ref >60)
Glucose, Bld: 104 mg/dL — ABNORMAL HIGH (ref 70–99)
Potassium: 3.3 mmol/L — ABNORMAL LOW (ref 3.5–5.1)
Sodium: 136 mmol/L (ref 135–145)

## 2020-11-26 MED ORDER — FERROUS SULFATE 325 (65 FE) MG PO TABS
325.0000 mg | ORAL_TABLET | Freq: Every day | ORAL | Status: DC
  Administered 2020-11-26 – 2020-12-08 (×13): 325 mg via ORAL
  Filled 2020-11-26 (×13): qty 1

## 2020-11-26 MED ORDER — POTASSIUM CHLORIDE 20 MEQ PO PACK
40.0000 meq | PACK | Freq: Two times a day (BID) | ORAL | Status: DC
  Administered 2020-11-26 – 2020-11-29 (×6): 40 meq via ORAL
  Filled 2020-11-26 (×6): qty 2

## 2020-11-26 NOTE — Progress Notes (Signed)
Physical Therapy Session Note  Patient Details  Name: Jon Reid MRN: [id number] Date of Birth: December 27, 1928  Today's Date: 11/26/2020 PT Individual Time: 1100-1200 + [id number] PT Individual Time Calculation (min): 60 min  + 29 min  Short Term Goals: Week 2:  PT Short Term Goal 1 (Week 2): Pt will complete bed mobility at minA level PT Short Term Goal 2 (Week 2): Pt will complete bed<>chair transfers with minA and LRAD PT Short Term Goal 3 (Week 2): Pt will ambulate 56f with minA and LRAD  Skilled Therapeutic Interventions/Progress Updates:     1st session: Pt supine in bed at start of session - awake and agreeable to therapy. Denies pain but grimaces to RLE during mobility. Donned TED's and socks with totalA at bed level. Pt assisted to EOB with maxA with assist for BLE and trunk management with cues for hip precautions and sequencing. Pt very stiff to move and has difficulty engaging at times. Once sitting EOB, demonstrates sacral sitting with slumped posture and requires some time to acclimate to upright and adjust to tall siting. Pt reporting need to void - provided urinal while seated EOB and pt was continent of bladder, 724m Pt required maxA for powering to rise to stand to RW, even from a raised EOB height. Completed stand<>pivot transfer with min/modA and RW to his TIS w/c. He's able to reposition self in w/c without assist. Pt transported to main rehab gym for time and then completed stand<>pivot transfer with minA and RW from TIS w/c to mat table. Pt does sit<>stands much better with arm rests to push up from. Focused remainder of session on functional gait training. He ambulated [id number] minA and RW - initially demo's short shuffling steps with antalgic gait pattern - cues for increasing step/stride length, keeping body within walker frame, and upright posture - pt able to correct deficits with cues but has trouble maintaining corrections. Improved ability to turn to sit  during safety approach for transfers compared to yesterday. He remained seated in TIS w/c at conclusion of session with safety belt alarm on and all his needs within reach.  2nd session: Pt in TIS w/c on arrival - awake and agreeable to therapy. Reports R hip pain, unrated. Rest breaks and mobility provided for pain management. Sit<>stand to RW with modA from w/c with cues needed for hand placement to assist in pushing himself to stand rather than pulling from RW. Ambulated ~90ft 45f CGA and RW, fading to minA after ~75ft d62fo fatigue. Cues for increasing stride/step length, keeping body within walker frame, increasing R weight shift, and up tall posturing. After seated rest break, completed 1x5 sit<>stand transfers with minA and RW with cues again needed to push from chair. Family arriving (wife and son) and spent some time educating them on pt's current mobility status. Wife and pt c/o house phone not working - notifieSoil scientistded session in w/c with safety belt alarm on and all needs within reach.   Therapy Documentation Precautions:  Precautions Precautions: Posterior Hip, Fall Precaution Comments: verbally reviewed Required Braces or Orthoses: Knee Immobilizer - Right Knee Immobilizer - Right:  (in bed only, off with therapies) Restrictions Weight Bearing Restrictions: No RLE Weight Bearing: Weight bearing as tolerated Other Position/Activity Restrictions: posterior hip precautions R LE General:    Therapy/Group: Individual Therapy  Elida Harbin P Lucylle Foulkes 11/26/2020, 7:09 AM

## 2020-11-26 NOTE — Progress Notes (Addendum)
PROGRESS NOTE   No new complaints. Says that pain is controlled. He's sleeping well.   ROS: Patient denies fever, rash, sore throat, blurred vision, nausea, vomiting, diarrhea, cough, shortness of breath or chest pain,   headache, or mood change.    Objective:   DG Swallowing Func-Speech Pathology  Result Date: 11/25/2020 Table formatting from the original result was not included. Objective Swallowing Evaluation: Type of Study: MBS-Modified Barium Swallow Study  Patient Details Name: JAYESH KUCHARSKI MRN: [id number] Date of Birth: May 12, 1928 Today's Date: 11/25/2020 Time: SLP Start Time (ACUTE ONLY): 0315 -SLP Stop Time (ACUTE ONLY): 0335 SLP Time Calculation (min) (ACUTE ONLY): 20 min Past Medical History: Past Medical History: Diagnosis Date  Arthritis   Carcinoma of prostate ([place])   prostate  CKD (chronic kidney disease), stage III ([place])   Coronary artery disease   a. 10/2012 Cath: LM 20-30d, lAD 80p, 47m 80-90d, LCX 90-928mOM1 90, RCA 50p, 2054m0d, RPDA 90, EF nl; b. 10/2012 CABG x 4 (LIMA-LAD, SVG-OM1, SVG-PDA->RPL); c. 07/2015 MV: Basal and proximal septal infarct w/o ischemia. EF 57%-->low risk.  First degree AV block 01/28/2019  Noted on EKG  GERD (gastroesophageal reflux disease)   Gout   HTN (hypertension)   Macrocytosis without anemia 01/08/2012  Moderate aortic stenosis   a. 10/2012 Echo: EF 55-60%, no rwma, BAE, mild AI, mild TR; b. 06/2020 Echo: EF 50-55%, no rwma, mild LVH. Mod dil LA. Mild MR. Mod AS.  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/02/2013  Sleep apnea   Questionable  Spinal stenosis   [facility]: Past Surgical History: Procedure Laterality Date  BALLOON DILATION N/A 10/03/2020  Procedure: BALLOON DILATION;  Surgeon: CarEloise HarmanO;  [facility];  Service: Endoscopy;  Laterality: N/A;  CARDIAC CATHETERIZATION  11/07/2012  Dr  NahAcie FredricksonATARACT EXTRACTION W/ INTRAOCULAR LENS IMPLANT Bilateral   CORONARY ARTERY BYPASS GRAFT N/A 11/11/2012  Procedure: CORONARY ARTERY BYPASS GRAFTING times four using Right Greater Saphenous Vein Graft harvested endoscopically and Left Internal Mammary Artery.;  Surgeon: PetIvin PootD;  [facility];  Laterality: N/A;  ESOPHAGEAL BRUSHING  10/03/2020  Procedure: ESOPHAGEAL BRUSHING;  Surgeon: CarEloise HarmanO;  [facility];  Service: Endoscopy;;  ESOPHAGOGASTRODUODENOSCOPY (EGD) WITH PROPOFOL N/A 06/27/2020  Surgeon: CarEloise HarmanO;  large hiatal hernia, LA grade D esophagitis with no bleeding, normal examined duodenum.   ESOPHAGOGASTRODUODENOSCOPY (EGD) WITH PROPOFOL N/A 10/03/2020  Procedure: ESOPHAGOGASTRODUODENOSCOPY (EGD) WITH PROPOFOL;  Surgeon: CarEloise HarmanO;  [facility];  Service: Endoscopy;  Laterality: N/A;  2:00pm  HIP ARTHROPLASTY Right 11/13/2020  Procedure: ARTHROPLASTY BIPOLAR HIP (HEMIARTHROPLASTY);  Surgeon: MurRenette ButtersD;  [facility];  Laterality: Right;  INGUINAL HERNIA REPAIR Right 01/10/2017  Procedure: OPEN RIGHT HERNIA REPAIR INGUINAL;  Surgeon: WhiIleana RoupD;  [facility];  Service: General;  Laterality: Right;  INSERTION OF MESH Right 01/10/2017  Procedure: INSERTION OF MESH;  Surgeon: WhiIleana RoupD;  [facility];  Service: General;  Laterality: Right;  INTRAOPERATIVE TRANSESOPHAGEAL ECHOCARDIOGRAM N/A 11/11/2012  Procedure: INTRAOPERATIVE TRANSESOPHAGEAL ECHOCARDIOGRAM;  Surgeon: Ivin Poot, MD;  Location: [facility];  Service: Open Heart Surgery;  Laterality: N/A;  LEFT HEART CATHETERIZATION WITH CORONARY ANGIOGRAM N/A 11/07/2012  Procedure: LEFT HEART CATHETERIZATION WITH CORONARY ANGIOGRAM;  Surgeon: Thayer Headings, MD;  Location: [facility] CATH LAB;  Service: Cardiovascular;  Laterality: N/A;  LUMBAR LAMINECTOMY/DECOMPRESSION MICRODISCECTOMY  N/A 02/05/2019  Procedure: LUMBAR LAMINECTOMY/DECOMPRESSION L3-L4;  Surgeon: Latanya Maudlin, MD;  [facility]: [facility];  Service: Orthopedics;  Laterality: N/A;  22mn  LYMPH NODE DISSECTION    Bilateral pelvic  RETROPUBIC PROSTATECTOMY    Radical HPI: Patient is a 87y.o. male with PMH: GERD (EGD July 2022 showing large hiatal hernia, gastritis, non-severe candidiasis esophagitis with no bleeding), stage III chronic kidney disease, coronary artery disease with history of first-degree block, hypertension, history of prostate cancer. he presented to hospital after a fall just prior to presentation. CT head negative, hip xray shows fracture of right hip.  Subjective: pleasant, alert, good historian Assessment / Plan / Recommendation CHL IP CLINICAL IMPRESSIONS 11/25/2020 Clinical Impression Patient presents with a mild-moderate oropharyngeal dysphagia with likely esophageal component secondary to h/o large hiatal hernia. Oral phase consisted of delays in anterior to posterior transit with puree and mechanical soft solids as well as decreased bolus cohesion with barium tablet in thin liquid barium. No significant amount of residuals in oral cavity post initial swallows. During pharyngeal phase of swallow, patient exhibited trace penetration during swallow(PAS 3) and one instance of silent aspiration (PAS 8) with thin liquid barium. Aspiration occured during cued use of chin tuck posture but not when patient's head in neutral position. He exhibited mild-moderate vallecular and pyriform sinus residuals with puree and mechanical soft solids, and trace vallecular residuals and mild pyriform residuals with thin liquids. Although nectar thick liquids did not result in any aspiration or penetraiton, it did cause increased pharyngeal residuals. Patient exhibited presence of cervical osteophytes (no radiologist present to confirm) which appeared to be restricting esophageal opening. UES opening was reduced leading to pharyngeal  residuals in pyriform sinuses and just above UES. SLP suspects that patient's dysphagia is likely chronic and may be exacerbated somewhat by his debility. SLP Visit Diagnosis -- Attention and concentration deficit following -- Frontal lobe and executive function deficit following -- Impact on safety and function --   CHL IP TREATMENT RECOMMENDATION 11/13/2020 Treatment Recommendations No treatment recommended at this time   Prognosis 11/25/2020 Prognosis for Safe Diet Advancement Good Barriers to Reach Goals -- Barriers/Prognosis Comment -- CHL IP DIET RECOMMENDATION 11/25/2020 SLP Diet Recommendations Regular solids;Thin liquid Liquid Administration via Straw;Cup Medication Administration Whole meds with puree Compensations Minimize environmental distractions;Slow rate;Small sips/bites Postural Changes Seated upright at 90 degrees;Remain semi-upright after after feeds/meals (Comment)   CHL IP OTHER RECOMMENDATIONS 11/25/2020 Recommended Consults -- Oral Care Recommendations Oral care BID Other Recommendations --   CHL IP FOLLOW UP RECOMMENDATIONS 11/13/2020 Follow up Recommendations None   No flowsheet data found.     CHL IP ORAL PHASE 11/25/2020 Oral Phase Impaired Oral - Pudding Teaspoon -- Oral - Pudding Cup -- Oral - Honey Teaspoon -- Oral - Honey Cup -- Oral - Nectar Teaspoon -- Oral - Nectar Cup WFL Oral - Nectar Straw -- Oral - Thin Teaspoon -- Oral - Thin Cup WFL Oral - Thin Straw WFL Oral - Puree Weak lingual manipulation;Reduced posterior propulsion;Delayed oral transit Oral - Mech Soft Weak lingual manipulation;Reduced posterior propulsion;Delayed oral  transit Oral - Regular -- Oral - Multi-Consistency -- Oral - Pill Delayed oral transit;Reduced posterior propulsion;Weak lingual manipulation;Decreased bolus cohesion Oral Phase - Comment --  CHL IP PHARYNGEAL PHASE 11/25/2020 Pharyngeal Phase Impaired Pharyngeal- Pudding Teaspoon -- Pharyngeal -- Pharyngeal- Pudding Cup -- Pharyngeal -- Pharyngeal- Honey Teaspoon --  Pharyngeal -- Pharyngeal- Honey Cup -- Pharyngeal -- Pharyngeal- Nectar Teaspoon -- Pharyngeal -- Pharyngeal- Nectar Cup -- Pharyngeal -- Pharyngeal- Nectar Straw -- Pharyngeal -- Pharyngeal- Thin Teaspoon -- Pharyngeal -- Pharyngeal- Thin Cup Pharyngeal residue - valleculae;Pharyngeal residue - pyriform;Penetration/Aspiration during swallow;Reduced airway/laryngeal closure Pharyngeal Material enters airway, passes BELOW cords without attempt by patient to eject out (silent aspiration) Pharyngeal- Thin Straw Reduced airway/laryngeal closure;Penetration/Aspiration during swallow;Pharyngeal residue - valleculae;Pharyngeal residue - pyriform Pharyngeal Material enters airway, remains ABOVE vocal cords and not ejected out Pharyngeal- Puree Pharyngeal residue - pyriform;Pharyngeal residue - valleculae;Lateral channel residue Pharyngeal -- Pharyngeal- Mechanical Soft Pharyngeal residue - valleculae;Pharyngeal residue - pyriform;Lateral channel residue Pharyngeal -- Pharyngeal- Regular -- Pharyngeal -- Pharyngeal- Multi-consistency -- Pharyngeal -- Pharyngeal- Pill WFL Pharyngeal -- Pharyngeal Comment --  CHL IP CERVICAL ESOPHAGEAL PHASE 11/25/2020 Cervical Esophageal Phase Impaired Pudding Teaspoon -- Pudding Cup -- Honey Teaspoon -- Honey Cup -- Nectar Teaspoon -- Nectar Cup Reduced cricopharyngeal relaxation Nectar Straw -- Thin Teaspoon -- Thin Cup Reduced cricopharyngeal relaxation Thin Straw Reduced cricopharyngeal relaxation Puree Reduced cricopharyngeal relaxation Mechanical Soft Reduced cricopharyngeal relaxation Regular -- Multi-consistency -- Pill Reduced cricopharyngeal relaxation Cervical Esophageal Comment -- Sonia Baller, MA, CCC-SLP Speech Therapy              Recent Labs    11/25/20 0458 11/26/20 0518  WBC 9.1 7.6  HGB 7.9* 8.1*  HCT 23.8* 24.6*  PLT 323 389   Recent Labs    11/25/20 0458 11/26/20 0518  NA 137 136  K 3.2* 3.3*  CL 104 108  CO2 22 21*  GLUCOSE 102* 104*  BUN 30* 24*   CREATININE 1.36* 1.31*  CALCIUM 8.4* 8.0*    Intake/Output Summary (Last 24 hours) at 11/26/2020 0959 Last data filed at 11/26/2020 [id number] Gross per 24 hour  Intake 503.74 ml  Output 750 ml  Net -246.26 ml     Pressure Injury 11/17/20 Sacrum Mid Stage 2 -  Partial thickness loss of dermis presenting as a shallow open injury with a red, pink wound bed without slough. round red shallow crater surrounded by denuded area with serous drainage (Active)  11/17/20 0315  Location: Sacrum  Location Orientation: Mid  Staging: Stage 2 -  Partial thickness loss of dermis presenting as a shallow open injury with a red, pink wound bed without slough.  Wound Description (Comments): round red shallow crater surrounded by denuded area with serous drainage  Present on Admission: Yes    Physical Exam: Vital Signs Blood pressure 130/62, pulse 65, temperature 98.1 F (36.7 C), resp. rate 16, height 6' (1.829 m), weight 67.9 kg, SpO2 96 %. Constitutional: No distress . Vital signs reviewed. HEENT: NCAT, EOMI, oral membranes moist Neck: supple Cardiovascular: RRR without murmur. No JVD    Respiratory/Chest: CTA Bilaterally without wheezes or rales. Normal effort    GI/Abdomen: BS +, non-tender, non-distended Ext: no clubbing, cyanosis, or edema Psych: pleasant and cooperative  Musculoskeletal:     Cervical back: Normal range of motion. No rigidity.     Comments: Right hip incision covered with steri-strips.  Moderate edema, no erythema seen.  UE 5-/5 B/L RLE- HF 3/5- in brace from mid thigh to R  ankle- cannot test- knee; DF/PF 4-/5 LLE- 4-/5 Skin:    Comments: Bilateral forearms with multiple ecchymotic areas.  L forearm iv- looks ok- but pulling at skin Peeling skin on top of head with actinic keratosis.   Sacral wound Neurological:     Mental Status: He is alert and oriented to person, place, and time.     Comments: Decreased hearing. Speech clear. Able to follow simple commands without  difficulty.  Intact to light touch x 4  Psychiatric:     Comments: Interactive; HOH    Assessment/Plan: 1. Functional deficits which require 3+ hours per day of interdisciplinary therapy in a comprehensive inpatient rehab setting. Physiatrist is providing close team supervision and 24 hour management of active medical problems listed below. Physiatrist and rehab team continue to assess barriers to discharge/monitor patient progress toward functional and medical goals  Care Tool:  Bathing    Body parts bathed by patient: Right arm, Left arm, Chest, Abdomen, Front perineal area, Face, Right upper leg, Left upper leg   Body parts bathed by helper: Buttocks     Bathing assist Assist Level: Moderate Assistance - Patient 50 - 74%     Upper Body Dressing/Undressing Upper body dressing   What is the patient wearing?: Hospital gown only    Upper body assist Assist Level: Maximal Assistance - Patient 25 - 49%    Lower Body Dressing/Undressing Lower body dressing      What is the patient wearing?: Pants     Lower body assist Assist for lower body dressing: Maximal Assistance - Patient 25 - 49% (with reacher)     Toileting Toileting    Toileting assist Assist for toileting: Total Assistance - Patient < 25% (urinal)     Transfers Chair/bed transfer  Transfers assist     Chair/bed transfer assist level: Minimal Assistance - Patient > 75%     Locomotion Ambulation   Ambulation assist   Ambulation activity did not occur: Safety/medical concerns  Assist level: Minimal Assistance - Patient > 75% Assistive device: Walker-rolling Max distance: 40   Walk 10 feet activity   Assist  Walk 10 feet activity did not occur: Safety/medical concerns  Assist level: Minimal Assistance - Patient > 75% Assistive device: Walker-rolling   Walk 50 feet activity   Assist Walk 50 feet with 2 turns activity did not occur: Safety/medical concerns  Assist level: Minimal  Assistance - Patient > 75% Assistive device: Walker-rolling    Walk 150 feet activity   Assist Walk 150 feet activity did not occur: Safety/medical concerns         Walk 10 feet on uneven surface  activity   Assist Walk 10 feet on uneven surfaces activity did not occur: Safety/medical concerns         Wheelchair     Assist Is the patient using a wheelchair?: Yes Type of Wheelchair: Manual Wheelchair activity did not occur: Refused         Wheelchair 50 feet with 2 turns activity    Assist    Wheelchair 50 feet with 2 turns activity did not occur: Refused       Wheelchair 150 feet activity     Assist  Wheelchair 150 feet activity did not occur: Refused       Blood pressure 130/62, pulse 65, temperature 98.1 F (36.7 C), resp. rate 16, height 6' (1.829 m), weight 67.9 kg, SpO2 96 %.  Medical Problem List and Plan: 1.  R hip fx s/p hemiarthroplasty secondary  to multiple falls             -patient may  shower if covered       -ELOS/Goals: 16-18 days- min Assist -Continue CIR therapies including PT, OT, and SLP  2.  Antithrombotics: -DVT/anticoagulation:  Pharmaceutical: Continue  to hold Lovenox given recent drop in hgb, transfusion  -TEDS, SCD's             -antiplatelet therapy: On ASA bid for DVT prophylaxis?-->decrease to once a day as on lovenox.  3. Pain: Decrease Norco to q12H prn for severe pain, tylenol prn.  4. Mood: LCSW to follow for evaluation and  support.              -antipsychotic agents: N/A 5. Neuropsych: This patient is not fully capable of making decisions on his own behalf. 6. Skin/Wound Care: Monitor wound for healing.  7. Fluids/Electrolytes/Nutrition: Monitor I/O.    -encourage fluids 8. Cough: New due to question aspiration event--Has been on Mucinex since 08/30-->+ Covid 8/26, afeb , WBC nl , CXR 9/1 low vol but unremarkable may d/c isolation, needs to wear mask when out of room. Increased Mucinex to 2 tabs BID.  9.  GERD: Continue Protonix BID. Continue IV abx 10. Parkinson's disease: Managed with Sinemet IR TID 11. CAD s/p CABG: Monitor for symptoms with increase in activity. Continue ASA.              --hold lasix due to intermittent low BP.  12. Acute on chronic anemia due to blood loss anemia: Hgb reviewed 9/2- decreased to 8.2, 8.1 on 9/10             --resumed iron supplement.  13. Constipation: Continue Senna S 14. Hypotension: medications reviewed, not on any medications for hypertension.  Vitals:   11/25/20 2042 11/26/20 0305  BP: 117/88 130/62  Pulse: 66 65  Resp: 16 16  Temp: (!) 97.4 F (36.3 C) 98.1 F (36.7 C)  SpO2: (!) 85% 96%    15. Hypokalemia: continue 82mq BID 9/10 still 3.3,  increase to 481m bid for 2 days 16. Aspiration pneumonia: continue IV vanc and zosyn, WBC normalized and confusion resolved 17. Insomnia: replaced trazodone with melatonin given hallucinations with former. Seems to be working 18. Mild to mod oropharyngeal dysphagia per MBS 9/9, likely esophageal component d/t HH. On regular diet -meds with puree -slow rate, small sips and bites -continue inpatient SLP.     LOS: 9 days A FACE TO FACE EVALUATION WAS PERFORMED  ZaMeredith Staggers/12/2020, 9:59 AM

## 2020-11-26 NOTE — Progress Notes (Signed)
Speech Language Pathology Daily Session Note  Patient Details  Name: Jon Reid MRN: [id number] Date of Birth: 11-Jun-1928  Today's Date: 11/26/2020 SLP Individual Time: 6503-5465 SLP Individual Time Calculation (min): 30 min  Short Term Goals: Week 1: SLP Short Term Goal 1 (Week 1): Patient will participate in objective evaluation of swallow function to determine if any oropharyngeal dysphagia. SLP Short Term Goal 2 (Week 1): SLP to determine if further dysphagia tx warranted pending MBS> SLP Short Term Goal 2 - Progress (Week 1): Met SLP Short Term Goal 3 (Week 1): Patient will tolerate current PO diet with mod I for use of swallow strategies.  Skilled Therapeutic Interventions: Pt seen for skilled ST with focus on dysphagia goals. Pt seen with final part of AM meal, nursing in room for medication administration. Pt coughing upon entry and stating "this just started a few minutes ago". Pt seen with whole meds in applesauce and thin liquids via straw demonstrating delayed wet, weak cough following final pill intake. SLP providing education to nursing and pt on results of MBS and recommendations for strict use of swallow precautions including sitting upright for intake, staying upright 45 minutes after intake, multiple swallows and effortful swallow. Pt benefiting from verbal cues for strong cough/throat clear and secondary swallow to attempt to clear wet vocal quality and suspected pharyngeal secretions/residue. Cont to assess tolerance for current diet of regular/thin with use of strategies as trained. Pt left in bed with alarm set and all needs within reach. Cont ST POC.   Pain None reported  Therapy/Group: Individual Therapy  Dewaine Conger 11/26/2020, 9:08 AM

## 2020-11-27 LAB — BASIC METABOLIC PANEL
Anion gap: 7 (ref 5–15)
BUN: 17 mg/dL (ref 8–23)
CO2: 21 mmol/L — ABNORMAL LOW (ref 22–32)
Calcium: 8.2 mg/dL — ABNORMAL LOW (ref 8.9–10.3)
Chloride: 109 mmol/L (ref 98–111)
Creatinine, Ser: 1.26 mg/dL — ABNORMAL HIGH (ref 0.61–1.24)
GFR, Estimated: 54 mL/min — ABNORMAL LOW (ref >60)
Glucose, Bld: 108 mg/dL — ABNORMAL HIGH (ref 70–99)
Potassium: 3.8 mmol/L (ref 3.5–5.1)
Sodium: 137 mmol/L (ref 135–145)

## 2020-11-27 NOTE — Plan of Care (Signed)
  Problem: Consults Goal: RH GENERAL PATIENT EDUCATION Description: See Patient Education module for education specifics. Outcome: Progressing   Problem: RH BOWEL ELIMINATION Goal: RH STG MANAGE BOWEL WITH ASSISTANCE Description: STG Manage Bowel with mod I Assistance. Outcome: Progressing Goal: RH STG MANAGE BOWEL W/MEDICATION W/ASSISTANCE Description: STG Manage Bowel with Medication with  mod I Assistance. Outcome: Progressing   Problem: RH BLADDER ELIMINATION Goal: RH STG MANAGE BLADDER WITH ASSISTANCE Description: STG Manage Bladder With mod I Assistance Outcome: Progressing   Problem: RH SKIN INTEGRITY Goal: RH STG SKIN FREE OF INFECTION/BREAKDOWN Description: W min assist Outcome: Progressing   Problem: RH SAFETY Goal: RH STG ADHERE TO SAFETY PRECAUTIONS W/ASSISTANCE/DEVICE Description: STG Adhere to Safety Precautions With cues Assistance/Device. Outcome: Progressing   Problem: RH PAIN MANAGEMENT Goal: RH STG PAIN MANAGED AT OR BELOW PT'S PAIN GOAL Description: At or below level 4 Outcome: Progressing   Problem: RH KNOWLEDGE DEFICIT GENERAL Goal: RH STG INCREASE KNOWLEDGE OF SELF CARE AFTER HOSPITALIZATION Description: Patient's wife and sons will be able to manage care at discharge using handouts and educational resources independently Outcome: Progressing

## 2020-11-27 NOTE — Progress Notes (Signed)
PROGRESS NOTE   No complaints. Denies cough or sob. Pain seems controlled  ROS: Patient denies fever, rash, sore throat, blurred vision, nausea, vomiting, diarrhea, cough, shortness of breath or chest pain,   headache, or mood change. .    Objective:   DG Swallowing Func-Speech Pathology  Result Date: 11/25/2020 Table formatting from the original result was not included. Objective Swallowing Evaluation: Type of Study: MBS-Modified Barium Swallow Study  Patient Details Name: Jon Reid MRN: [id number] Date of Birth: [birth year] Today's Date: 11/25/2020 Time: SLP Start Time (ACUTE ONLY): 0315 -SLP Stop Time (ACUTE ONLY): 0335 SLP Time Calculation (min) (ACUTE ONLY): 20 min Past Medical History: Past Medical History: Diagnosis Date  Arthritis   Carcinoma of prostate (Jon Reid)   prostate  CKD (chronic kidney disease), stage III (Jon Reid)   Coronary artery disease   a. 10/2012 Cath: LM 20-30d, lAD 80p, 25m 80-90d, LCX 90-973mOM1 90, RCA 50p, 203m0d, RPDA 90, EF nl; b. 10/2012 CABG x 4 (LIMA-LAD, SVG-OM1, SVG-PDA->RPL); c. 07/2015 MV: Basal and proximal septal infarct w/o ischemia. EF 57%-->low risk.  First degree AV block 01/28/2019  Noted on EKG  GERD (gastroesophageal reflux disease)   Gout   HTN (hypertension)   Macrocytosis without anemia 01/08/2012  Moderate aortic stenosis   a. 10/2012 Echo: EF 55-60%, no rwma, BAE, mild AI, mild TR; b. 06/2020 Echo: EF 50-55%, no rwma, mild LVH. Mod dil LA. Mild MR. Mod AS.  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/02/2013  Sleep apnea   Questionable  Spinal stenosis   [facility]: Past Surgical History: Procedure Laterality Date  BALLOON DILATION N/A 10/03/2020  Procedure: BALLOON DILATION;  Surgeon: CarEloise HarmanO;  [facility];  Service: Endoscopy;  Laterality: N/A;  CARDIAC CATHETERIZATION  11/07/2012  Dr NahAcie FredricksonCATARACT EXTRACTION W/ INTRAOCULAR LENS IMPLANT Bilateral   CORONARY ARTERY BYPASS GRAFT N/A 11/11/2012  Procedure: CORONARY ARTERY BYPASS GRAFTING times four using Right Greater Saphenous Vein Graft harvested endoscopically and Left Internal Mammary Artery.;  Surgeon: PetIvin PootD;  [facility];  Laterality: N/A;  ESOPHAGEAL BRUSHING  10/03/2020  Procedure: ESOPHAGEAL BRUSHING;  Surgeon: CarEloise HarmanO;  [facility];  Service: Endoscopy;;  ESOPHAGOGASTRODUODENOSCOPY (EGD) WITH PROPOFOL N/A 06/27/2020  Surgeon: CarEloise HarmanO;  large hiatal hernia, LA grade D esophagitis with no bleeding, normal examined duodenum.   ESOPHAGOGASTRODUODENOSCOPY (EGD) WITH PROPOFOL N/A 10/03/2020  Procedure: ESOPHAGOGASTRODUODENOSCOPY (EGD) WITH PROPOFOL;  Surgeon: CarEloise HarmanO;  [facility];  Service: Endoscopy;  Laterality: N/A;  2:00pm  HIP ARTHROPLASTY Right 11/13/2020  Procedure: ARTHROPLASTY BIPOLAR HIP (HEMIARTHROPLASTY);  Surgeon: MurRenette ButtersD;  [facility];  Laterality: Right;  INGUINAL HERNIA REPAIR Right 01/10/2017  Procedure: OPEN RIGHT HERNIA REPAIR INGUINAL;  Surgeon: WhiIleana RoupD;  [facility];  Service: General;  Laterality: Right;  INSERTION OF MESH Right 01/10/2017  Procedure: INSERTION OF MESH;  Surgeon: WhiIleana RoupD;  [facility];  Service: General;  Laterality: Right;  INTRAOPERATIVE TRANSESOPHAGEAL ECHOCARDIOGRAM N/A 11/11/2012  Procedure: INTRAOPERATIVE TRANSESOPHAGEAL ECHOCARDIOGRAM;  Surgeon: Ivin Poot, MD;  Location: [facility];  Service: Open Heart Surgery;  Laterality: N/A;  LEFT HEART CATHETERIZATION WITH CORONARY ANGIOGRAM N/A 11/07/2012  Procedure: LEFT HEART CATHETERIZATION WITH CORONARY ANGIOGRAM;  Surgeon: Thayer Headings, MD;  Location: [facility] C[facility];  Service: Cardiovascular;  Laterality: N/A;  LUMBAR LAMINECTOMY/DECOMPRESSION MICRODISCECTOMY N/A  02/05/2019  Procedure: LUMBAR LAMINECTOMY/DECOMPRESSION L3-L4;  Surgeon: Latanya Maudlin, MD;  Location: [facility];  Service: Orthopedics;  Laterality: N/A;  47mn  LYMPH NODE DISSECTION    Bilateral pelvic  RETROPUBIC PROSTATECTOMY    Radical HPI: Patient is a 87y.o. male with PMH: GERD (EGD July 2022 showing large hiatal hernia, gastritis, non-severe candidiasis esophagitis with no bleeding), stage III chronic kidney disease, coronary artery disease with history of first-degree block, hypertension, history of prostate cancer. he presented to hospital after a fall just prior to presentation. CT head negative, hip xray shows fracture of right hip.  Subjective: pleasant, alert, good historian Assessment / Plan / Recommendation CHL IP CLINICAL IMPRESSIONS 11/25/2020 Clinical Impression Patient presents with a mild-moderate oropharyngeal dysphagia with likely esophageal component secondary to h/o large hiatal hernia. Oral phase consisted of delays in anterior to posterior transit with puree and mechanical soft solids as well as decreased bolus cohesion with barium tablet in thin liquid barium. No significant amount of residuals in oral cavity post initial swallows. During pharyngeal phase of swallow, patient exhibited trace penetration during swallow(PAS 3) and one instance of silent aspiration (PAS 8) with thin liquid barium. Aspiration occured during cued use of chin tuck posture but not when patient's head in neutral position. He exhibited mild-moderate vallecular and pyriform sinus residuals with puree and mechanical soft solids, and trace vallecular residuals and mild pyriform residuals with thin liquids. Although nectar thick liquids did not result in any aspiration or penetraiton, it did cause increased pharyngeal residuals. Patient exhibited presence of cervical osteophytes (no radiologist present to confirm) which appeared to be restricting esophageal opening. UES opening was reduced leading to pharyngeal  residuals in pyriform sinuses and just above UES. SLP suspects that patient's dysphagia is likely chronic and may be exacerbated somewhat by his debility. SLP Visit Diagnosis -- Attention and concentration deficit following -- Frontal lobe and executive function deficit following -- Impact on safety and function --   CHL IP TREATMENT RECOMMENDATION 11/13/2020 Treatment Recommendations No treatment recommended at this time   Prognosis 11/25/2020 Prognosis for Safe Diet Advancement Good Barriers to Reach Goals -- Barriers/Prognosis Comment -- CHL IP DIET RECOMMENDATION 11/25/2020 SLP Diet Recommendations Regular solids;Thin liquid Liquid Administration via Straw;Cup Medication Administration Whole meds with puree Compensations Minimize environmental distractions;Slow rate;Small sips/bites Postural Changes Seated upright at 90 degrees;Remain semi-upright after after feeds/meals (Comment)   CHL IP OTHER RECOMMENDATIONS 11/25/2020 Recommended Consults -- Oral Care Recommendations Oral care BID Other Recommendations --   CHL IP FOLLOW UP RECOMMENDATIONS 11/13/2020 Follow up Recommendations None   No flowsheet data found.     CHL IP ORAL PHASE 11/25/2020 Oral Phase Impaired Oral - Pudding Teaspoon -- Oral - Pudding Cup -- Oral - Honey Teaspoon -- Oral - Honey Cup -- Oral - Nectar Teaspoon -- Oral - Nectar Cup WFL Oral - Nectar Straw -- Oral - Thin Teaspoon -- Oral - Thin Cup WFL Oral - Thin Straw WFL Oral - Puree Weak lingual manipulation;Reduced posterior propulsion;Delayed oral transit Oral - Mech Soft Weak lingual manipulation;Reduced posterior propulsion;Delayed oral transit Oral -  Regular -- Oral - Multi-Consistency -- Oral - Pill Delayed oral transit;Reduced posterior propulsion;Weak lingual manipulation;Decreased bolus cohesion Oral Phase - Comment --  CHL IP PHARYNGEAL PHASE 11/25/2020 Pharyngeal Phase Impaired Pharyngeal- Pudding Teaspoon -- Pharyngeal -- Pharyngeal- Pudding Cup -- Pharyngeal -- Pharyngeal- Honey Teaspoon --  Pharyngeal -- Pharyngeal- Honey Cup -- Pharyngeal -- Pharyngeal- Nectar Teaspoon -- Pharyngeal -- Pharyngeal- Nectar Cup -- Pharyngeal -- Pharyngeal- Nectar Straw -- Pharyngeal -- Pharyngeal- Thin Teaspoon -- Pharyngeal -- Pharyngeal- Thin Cup Pharyngeal residue - valleculae;Pharyngeal residue - pyriform;Penetration/Aspiration during swallow;Reduced airway/laryngeal closure Pharyngeal Material enters airway, passes BELOW cords without attempt by patient to eject out (silent aspiration) Pharyngeal- Thin Straw Reduced airway/laryngeal closure;Penetration/Aspiration during swallow;Pharyngeal residue - valleculae;Pharyngeal residue - pyriform Pharyngeal Material enters airway, remains ABOVE vocal cords and not ejected out Pharyngeal- Puree Pharyngeal residue - pyriform;Pharyngeal residue - valleculae;Lateral channel residue Pharyngeal -- Pharyngeal- Mechanical Soft Pharyngeal residue - valleculae;Pharyngeal residue - pyriform;Lateral channel residue Pharyngeal -- Pharyngeal- Regular -- Pharyngeal -- Pharyngeal- Multi-consistency -- Pharyngeal -- Pharyngeal- Pill WFL Pharyngeal -- Pharyngeal Comment --  CHL IP CERVICAL ESOPHAGEAL PHASE 11/25/2020 Cervical Esophageal Phase Impaired Pudding Teaspoon -- Pudding Cup -- Honey Teaspoon -- Honey Cup -- Nectar Teaspoon -- Nectar Cup Reduced cricopharyngeal relaxation Nectar Straw -- Thin Teaspoon -- Thin Cup Reduced cricopharyngeal relaxation Thin Straw Reduced cricopharyngeal relaxation Puree Reduced cricopharyngeal relaxation Mechanical Soft Reduced cricopharyngeal relaxation Regular -- Multi-consistency -- Pill Reduced cricopharyngeal relaxation Cervical Esophageal Comment -- Sonia Baller, MA, CCC-SLP Speech Therapy              Recent Labs    11/25/20 0458 11/26/20 0518  WBC 9.1 7.6  HGB 7.9* 8.1*  HCT 23.8* 24.6*  PLT 323 389   Recent Labs    11/26/20 0518 11/27/20 0620  NA 136 137  K 3.3* 3.8  CL 108 109  CO2 21* 21*  GLUCOSE 104* 108*  BUN 24* 17   CREATININE 1.31* 1.26*  CALCIUM 8.0* 8.2*    Intake/Output Summary (Last 24 hours) at 11/27/2020 0834 Last data filed at 11/27/2020 0753 Gross per 24 hour  Intake 578 ml  Output 600 ml  Net -22 ml     Pressure Injury 11/17/20 Sacrum Mid Stage 2 -  Partial thickness loss of dermis presenting as a shallow open injury with a red, pink wound bed without slough. round red shallow crater surrounded by denuded area with serous drainage (Active)  11/17/20 0315  Location: Sacrum  Location Orientation: Mid  Staging: Stage 2 -  Partial thickness loss of dermis presenting as a shallow open injury with a red, pink wound bed without slough.  Wound Description (Comments): round red shallow crater surrounded by denuded area with serous drainage  Present on Admission: Yes    Physical Exam: Vital Signs Blood pressure (!) 104/54, pulse 68, temperature 99.3 F (37.4 C), temperature source Oral, resp. rate 16, height 6' (1.829 m), weight 67.9 kg, SpO2 93 %. Constitutional: No distress . Vital signs reviewed. HEENT: NCAT, EOMI, oral membranes moist Neck: supple Cardiovascular: RRR without murmur. No JVD    Respiratory/Chest: CTA Bilaterally without wheezes or rales. Normal effort    GI/Abdomen: BS +, non-tender, non-distended Ext: no clubbing, cyanosis, or edema Psych: pleasant and cooperative  Musculoskeletal:     Cervical back: Normal range of motion. No rigidity.     Comments: Right hip incision covered with steri-strips.  Moderate edema, no erythema seen.  UE 5-/5 B/L RLE- HF 3/5- in brace from mid thigh to  R ankle- cannot test- knee; DF/PF 4-/5 LLE- 4-/5 Skin:    Comments: Bilateral forearms with multiple ecchymotic areas.   Peeling skin on top of head with actinic keratosis.   Sacral wound Neurological:     Mental Status: He is alert and oriented to person, place, and time.     Comments: Decreased hearing. Speech clear. Able to follow simple commands without difficulty.  Intact to  light touch x 4     Assessment/Plan: 1. Functional deficits which require 3+ hours per day of interdisciplinary therapy in a comprehensive inpatient rehab setting. Physiatrist is providing close team supervision and 24 hour management of active medical problems listed below. Physiatrist and rehab team continue to assess barriers to discharge/monitor patient progress toward functional and medical goals  Care Tool:  Bathing    Body parts bathed by patient: Right arm, Left arm, Chest, Abdomen, Front perineal area, Face, Right upper leg, Left upper leg   Body parts bathed by helper: Buttocks     Bathing assist Assist Level: Moderate Assistance - Patient 50 - 74%     Upper Body Dressing/Undressing Upper body dressing   What is the patient wearing?: Hospital gown only    Upper body assist Assist Level: Maximal Assistance - Patient 25 - 49%    Lower Body Dressing/Undressing Lower body dressing      What is the patient wearing?: Pants     Lower body assist Assist for lower body dressing: Maximal Assistance - Patient 25 - 49% (with reacher)     Toileting Toileting    Toileting assist Assist for toileting: Total Assistance - Patient < 25% (urinal)     Transfers Chair/bed transfer  Transfers assist     Chair/bed transfer assist level: Minimal Assistance - Patient > 75%     Locomotion Ambulation   Ambulation assist   Ambulation activity did not occur: Safety/medical concerns  Assist level: Minimal Assistance - Patient > 75% Assistive device: Walker-rolling Max distance: 40   Walk 10 feet activity   Assist  Walk 10 feet activity did not occur: Safety/medical concerns  Assist level: Minimal Assistance - Patient > 75% Assistive device: Walker-rolling   Walk 50 feet activity   Assist Walk 50 feet with 2 turns activity did not occur: Safety/medical concerns  Assist level: Minimal Assistance - Patient > 75% Assistive device: Walker-rolling    Walk 150  feet activity   Assist Walk 150 feet activity did not occur: Safety/medical concerns         Walk 10 feet on uneven surface  activity   Assist Walk 10 feet on uneven surfaces activity did not occur: Safety/medical concerns         Wheelchair     Assist Is the patient using a wheelchair?: Yes Type of Wheelchair: Manual Wheelchair activity did not occur: Refused         Wheelchair 50 feet with 2 turns activity    Assist    Wheelchair 50 feet with 2 turns activity did not occur: Refused       Wheelchair 150 feet activity     Assist  Wheelchair 150 feet activity did not occur: Refused       Blood pressure (!) 104/54, pulse 68, temperature 99.3 F (37.4 C), temperature source Oral, resp. rate 16, height 6' (1.829 m), weight 67.9 kg, SpO2 93 %.  Medical Problem List and Plan: 1.  R hip fx s/p hemiarthroplasty secondary to multiple falls             -  patient may  shower if covered       -ELOS/Goals: 16-18 days- min Assist -Continue CIR therapies including PT, OT, and SLP  2.  Antithrombotics: -DVT/anticoagulation:  Pharmaceutical: Continue  to hold Lovenox given recent drop in hgb, transfusion  -TEDS, SCD's             -antiplatelet therapy: On ASA bid for DVT prophylaxis?-->decrease to once a day as on lovenox.  3. Pain: Decrease Norco to q12H prn for severe pain, tylenol prn.  4. Mood: LCSW to follow for evaluation and  support.              -antipsychotic agents: N/A 5. Neuropsych: This patient is not fully capable of making decisions on his own behalf. 6. Skin/Wound Care: Monitor wound for healing.  7. Fluids/Electrolytes/Nutrition: Monitor I/O.    -encourage fluids 8. Cough: New due to question aspiration event--Has been on Mucinex since 08/30-->+ Covid 8/26, afeb , WBC nl , CXR 9/1 low vol but unremarkable may d/c isolation, needs to wear mask when out of room. Increased Mucinex to 2 tabs BID.  9. GERD: Continue Protonix BID. Continue IV  abx 10. Parkinson's disease: Managed with Sinemet IR TID 11. CAD s/p CABG: Monitor for symptoms with increase in activity. Continue ASA.              --hold lasix due to intermittent low BP.  12. Acute on chronic anemia due to blood loss anemia: Hgb reviewed 9/2- decreased to 8.2, 8.1 on 9/10             --resumed iron supplement.  13. Constipation: Continue Senna S 14. Hypotension: medications reviewed, not on any medications for hypertension.  Vitals:   11/26/20 1942 11/27/20 0317  BP: 114/64 (!) 104/54  Pulse: 76 68  Resp: 20 16  Temp: 99.2 F (37.3 C) 99.3 F (37.4 C)  SpO2: 92% 93%    15. Hypokalemia: continue 2mq BID 9/10 still 3.3,  increase to 432m bid for 2 days 16. Aspiration pneumonia: continue IV vanc and zosyn, WBC normalized and confusion resolved  -9/11 low grade temp---encourage IS   -already on broad spectrum covg   -no concerning findings on exam 17. Insomnia: replaced trazodone with melatonin given hallucinations with former. Seems to be working 18. Mild to mod oropharyngeal dysphagia per MBS 9/9, likely esophageal component d/t HH. On regular diet -meds with puree -slow rate, small sips and bites -continue inpatient SLP.     LOS: 10 days A FACE TO FACE EVALUATION WAS PERFORMED  ZaMeredith Staggers/01/2021, 8:34 AM

## 2020-11-28 LAB — CBC
HCT: 26.1 % — ABNORMAL LOW (ref 39.0–52.0)
Hemoglobin: 8.5 g/dL — ABNORMAL LOW (ref 13.0–17.0)
MCH: 30.8 pg (ref 26.0–34.0)
MCHC: 32.6 g/dL (ref 30.0–36.0)
MCV: 94.6 fL (ref 80.0–100.0)
Platelets: 465 10*3/uL — ABNORMAL HIGH (ref 150–400)
RBC: 2.76 MIL/uL — ABNORMAL LOW (ref 4.22–5.81)
RDW: 16.5 % — ABNORMAL HIGH (ref 11.5–15.5)
WBC: 11.5 10*3/uL — ABNORMAL HIGH (ref 4.0–10.5)
nRBC: 0 % (ref 0.0–0.2)

## 2020-11-28 LAB — BASIC METABOLIC PANEL
Anion gap: 6 (ref 5–15)
BUN: 15 mg/dL (ref 8–23)
CO2: 21 mmol/L — ABNORMAL LOW (ref 22–32)
Calcium: 8.1 mg/dL — ABNORMAL LOW (ref 8.9–10.3)
Chloride: 109 mmol/L (ref 98–111)
Creatinine, Ser: 1.3 mg/dL — ABNORMAL HIGH (ref 0.61–1.24)
GFR, Estimated: 52 mL/min — ABNORMAL LOW (ref >60)
Glucose, Bld: 120 mg/dL — ABNORMAL HIGH (ref 70–99)
Potassium: 4.2 mmol/L (ref 3.5–5.1)
Sodium: 136 mmol/L (ref 135–145)

## 2020-11-28 NOTE — Progress Notes (Signed)
PROGRESS NOTE   +cough Denies other complaints Checked in with nursing- no other concerns WBC up to 11.5 today, repeat tomorrow to trend  ROS: Patient denies fever, rash, sore throat, blurred vision, nausea, vomiting, diarrhea, or chest pain,   headache, or mood change. +cough   Objective:   No results found. Recent Labs    11/26/20 0518 11/28/20 0659  WBC 7.6 11.5*  HGB 8.1* 8.5*  HCT 24.6* 26.1*  PLT 389 465*   Recent Labs    11/27/20 0620 11/28/20 0659  NA 137 136  K 3.8 4.2  CL 109 109  CO2 21* 21*  GLUCOSE 108* 120*  BUN 17 15  CREATININE 1.26* 1.30*  CALCIUM 8.2* 8.1*    Intake/Output Summary (Last 24 hours) at 11/28/2020 1140 Last data filed at 11/28/2020 0800 Gross per 24 hour  Intake 574 ml  Output 500 ml  Net 74 ml     Pressure Injury 11/17/20 Sacrum Mid Stage 2 -  Partial thickness loss of dermis presenting as a shallow open injury with a red, pink wound bed without slough. round red shallow crater surrounded by denuded area with serous drainage (Active)  11/17/20 0315  Location: Sacrum  Location Orientation: Mid  Staging: Stage 2 -  Partial thickness loss of dermis presenting as a shallow open injury with a red, pink wound bed without slough.  Wound Description (Comments): round red shallow crater surrounded by denuded area with serous drainage  Present on Admission: Yes    Physical Exam: Vital Signs Blood pressure (!) 119/59, pulse 70, temperature 99 F (37.2 C), temperature source Oral, resp. rate 14, height 6' (1.829 m), weight 67.9 kg, SpO2 93 %. Gen: no distress, normal appearing HEENT: oral mucosa pink and moist, NCAT Cardio: Reg rate Chest: normal effort, normal rate of breathing Abd: soft, non-distended Ext: no edema Psych: pleasant, normal affect  Musculoskeletal:     Cervical back: Normal range of motion. No rigidity.     Comments: Right hip incision covered with  steri-strips.  Moderate edema, no erythema seen.  UE 5-/5 B/L RLE- HF 3/5- in brace from mid thigh to R ankle- cannot test- knee; DF/PF 4-/5 LLE- 4-/5 Skin:    Comments: Bilateral forearms with multiple ecchymotic areas.   Peeling skin on top of head with actinic keratosis.   Sacral wound Neurological:     Mental Status: He is alert and oriented to person, place, and time.     Comments: Decreased hearing. Speech clear. Able to follow simple commands without difficulty.  Intact to light touch x 4     Assessment/Plan: 1. Functional deficits which require 3+ hours per day of interdisciplinary therapy in a comprehensive inpatient rehab setting. Physiatrist is providing close team supervision and 24 hour management of active medical problems listed below. Physiatrist and rehab team continue to assess barriers to discharge/monitor patient progress toward functional and medical goals  Care Tool:  Bathing    Body parts bathed by patient: Right arm, Left arm, Chest, Abdomen, Front perineal area, Face, Right upper leg, Left upper leg   Body parts bathed by helper: Buttocks     Bathing assist Assist Level: Moderate Assistance -  Patient 50 - 74%     Upper Body Dressing/Undressing Upper body dressing   What is the patient wearing?: Hospital gown only    Upper body assist Assist Level: Maximal Assistance - Patient 25 - 49%    Lower Body Dressing/Undressing Lower body dressing      What is the patient wearing?: Pants     Lower body assist Assist for lower body dressing: Maximal Assistance - Patient 25 - 49% (with reacher)     Toileting Toileting    Toileting assist Assist for toileting: Total Assistance - Patient < 25% (urinal)     Transfers Chair/bed transfer  Transfers assist     Chair/bed transfer assist level: Minimal Assistance - Patient > 75%     Locomotion Ambulation   Ambulation assist   Ambulation activity did not occur: Safety/medical  concerns  Assist level: Minimal Assistance - Patient > 75% Assistive device: Walker-rolling Max distance: 40   Walk 10 feet activity   Assist  Walk 10 feet activity did not occur: Safety/medical concerns  Assist level: Minimal Assistance - Patient > 75% Assistive device: Walker-rolling   Walk 50 feet activity   Assist Walk 50 feet with 2 turns activity did not occur: Safety/medical concerns  Assist level: Minimal Assistance - Patient > 75% Assistive device: Walker-rolling    Walk 150 feet activity   Assist Walk 150 feet activity did not occur: Safety/medical concerns         Walk 10 feet on uneven surface  activity   Assist Walk 10 feet on uneven surfaces activity did not occur: Safety/medical concerns         Wheelchair     Assist Is the patient using a wheelchair?: Yes Type of Wheelchair: Manual Wheelchair activity did not occur: Refused         Wheelchair 50 feet with 2 turns activity    Assist    Wheelchair 50 feet with 2 turns activity did not occur: Refused       Wheelchair 150 feet activity     Assist  Wheelchair 150 feet activity did not occur: Refused       Blood pressure (!) 119/59, pulse 70, temperature 99 F (37.2 C), temperature source Oral, resp. rate 14, height 6' (1.829 m), weight 67.9 kg, SpO2 93 %.  Medical Problem List and Plan: 1.  R hip fx s/p hemiarthroplasty secondary to multiple falls             -patient may  shower if covered       -ELOS/Goals: 16-18 days- min Assist -Continue CIR therapies including PT, OT, and SLP  2.  Antithrombotics: -DVT/anticoagulation:  Pharmaceutical: Continue to hold Lovenox given recent drop in hgb, transfusion  -TEDS, SCD's             -antiplatelet therapy: On ASA bid for DVT prophylaxis?-->decrease to once a day as on lovenox.  3. Pain: Decrease Norco to q12H prn for severe pain, tylenol prn.  4. Mood: LCSW to follow for evaluation and  support.               -antipsychotic agents: N/A 5. Neuropsych: This patient is not fully capable of making decisions on his own behalf. 6. Skin/Wound Care: Monitor wound for healing.  7. Fluids/Electrolytes/Nutrition: Monitor I/O.    -encourage fluids 8. Cough: New due to question aspiration event--Has been on Mucinex since 08/30-->+ Covid 8/26, afeb , WBC nl , CXR 9/1 low vol but unremarkable may d/c isolation, needs to  wear mask when out of room. Increased Mucinex to 2 tabs BID.  9. GERD: Continue Protonix BID. Continue IV abx 10. Parkinson's disease: Managed with Sinemet IR TID 11. CAD s/p CABG: Monitor for symptoms with increase in activity. Continue ASA.              --hold lasix due to intermittent low BP.  12. Acute on chronic anemia due to blood loss anemia: Hgb reviewed 9/2- decreased to 8.2, 8.1 on 9/10, 8.5 on 9/12- repeat tomorrow             --resumed iron supplement.  13. Constipation: Continue Senna S 14. Hypotension: medications reviewed, not on any medications for hypertension.  Vitals:   11/27/20 2058 11/28/20 0442  BP: 121/71 (!) 119/59  Pulse: 77 70  Resp: 16 14  Temp: 99 F (37.2 C) 99 F (37.2 C)  SpO2: 97% 93%    15. Hypokalemia: continue 33mq BID 9/10 still 3.3,  increase to 468m bid for 2 days 16. Aspiration pneumonia: continue IV vanc and zosyn, WBC normalized and confusion resolved  -9/11 low grade temp---encourage IS   -already on broad spectrum covg   -no concerning findings on exam  9/12: rising leukocytosis to 12.5- repeat CBC tomorrow.  17. Insomnia: replaced trazodone with melatonin given hallucinations with former. Seems to be working 18. Mild to mod oropharyngeal dysphagia per MBS 9/9, likely esophageal component d/t HH. On regular diet -meds with puree -slow rate, small sips and bites -continue inpatient SLP.  19. AKI: placed nursing order to encourage 6-8 glasses of water per day, repeat BMP tomorrow.     LOS: 11 days A FACE TO FACE EVALUATION WAS  PERFORMED  Annalina Needles P Khamari Yousuf 11/28/2020, 11:40 AM

## 2020-11-28 NOTE — Progress Notes (Signed)
Pharmacy Antibiotic Note  Jon Reid is a 87 y.o. male admitted to [place] on 11/17/2020  after [facility] hospitalization on 8/26 for right hip fracture after a fall  at home. S/p R THR on 11/13/20. Pharmacy has been consulted for Vancomycin and Zosyn dosing for aspiration pneumonia on 11/23/20.   Day # 6 antibiotics. Creatinine trended up a some on 9/8 and then back to baseline since. Vancomycin regimen remains appropriate. WBC trended up some today and plan recheck in am.  Plan: Continue Zosyn 3.375 gm IV q8h (each over 4 hours) Continue Vancomycin 1gm IV q24hrs.  Goal AUC: 400-550 Expected AUC: 548  Scr used 1.36. Will need to check Vanc levels soon if antibiotics expected to continue more than another day or two. Follow renal function, clinical progress, antibiotic plans.   Height: 6' (182.9 cm) Weight: 67.9 kg (149 lb 11.1 oz) IBW/kg (Calculated) : 77.6  Temp (24hrs), Avg:98.8 F (37.1 C), Min:98.6 F (37 C), Max:99 F (37.2 C)  Recent Labs  Lab 11/23/20 1019 11/23/20 1843 11/23/20 2225 11/24/20 0502 11/25/20 0458 11/26/20 0518 11/27/20 0620 11/28/20 0659  WBC 20.5*  --   --  18.0* 9.1 7.6  --  11.5*  CREATININE 1.37*  --   --  1.56* 1.36* 1.31* 1.26* 1.30*  LATICACIDVEN  --  2.6* 1.9  --   --   --   --   --     Estimated Creatinine Clearance: 36.3 mL/min (A) (by C-G formula based on SCr of 1.3 mg/dL (H)).    Allergies  Allergen Reactions   Crestor [Rosuvastatin] Swelling    GYNECOMASTIA     Antimicrobials this admission: Vancomycin 9/7 >> Zosyn 9/7 >>  Dose adjustments this admission: 9/8 SCr up to 1.56, adjusted vanc to 1500 mg IV q48hr 9/9 scr 1.36,  chgd back to '1000mg'$  q24h   Microbiology results: 9/7 urine: 20k col/ml Proteus mirabilis, pan sensitive, except nitrofurantoin 8/26  MRSA PCR: negative  Thank you for allowing pharmacy to be a part of this patient's care.  Arty Baumgartner, [place] 11/28/2020 2:45 PM

## 2020-11-28 NOTE — Progress Notes (Signed)
Physical Therapy Session Note  Patient Details  Name: Jon Reid MRN: [id number] Date of Birth: 03-04-1929  Today's Date: 11/28/2020 PT Individual Time: [id number] + 1100-1200 PT Individual Time Calculation (min): 55 min  + 60 min  Short Term Goals: Week 2:  PT Short Term Goal 1 (Week 2): Pt will complete bed mobility at minA level PT Short Term Goal 2 (Week 2): Pt will complete bed<>chair transfers with minA and LRAD PT Short Term Goal 3 (Week 2): Pt will ambulate 32f with minA and LRAD  Skilled Therapeutic Interventions/Progress Updates:     1st session: Pt supine in bed at start of session - c/o no pain at rest but does endorse some with mobility in regards to his R leg. He reports need to void - provided urinal and pt continent of bladder. Donned knee-high TED's and slip-on shoes at bed level, totalA. Donned shorts at bed level as well, requiring maxA for clothing management. Able to roll in bed with minA with use of bed rails. Supine<>sit required modA with HOB flat, difficulty with both BLE and trunk management with cues for hip precautions. Able to scoot himself at EOB with cues. C/o mild dizziness upon initial sitting, provided time for adjusting to upright. BP assessed in sitting reading 123/61 with HR 76. Sit<>stand from raised EOB height with modA to RW. Ambulated with CGA/minA and RW a few feet to his TIS w/c within his room - cues needed for safety approach and hand placement during transfers. Pt then wheeled to rehab gym for time. Sit<>stand from TIS w/c with minA to RW and min cues for hand placement to assist in pushing up to stand. Ambulated 2x873fwith minA and RW - cues needed for slowing down cadence, increasing step/stride length, keeping body within walker frame, upright posture, and forward gaze. Some mild R knee buckling present due to pain but this reduced during 2nd trial with cues. Extended seated rest break needed due to fatigue b/w gait trials. Returned to room and  remained slightly reclined in TIS w/c with safety belt alarm on and all his needs within reach at conclusion of session.  2nd session: Pt sitting in TIS w/c at start of session - agreeable to therapy and no reports of pain. [place] to ortho rehab gym for time. Assisted to Nustep via stand<>pivot transfer with minA and RW - cues needed for hand placement and safety approach. Required setupA for Nustep, especially for his R leg. Completed a total of 12 minutes at workload of 5, using all x4 extremities, focusing on AAROM for R hip and global strengthening/endurance training. Able to maintain a ~40 steps/minute cadence. [facility]W to car simulator (car height equal to standard sedan). He was able to transfer onto the car with minA and RW but required modA for managing BLE's into/out of the car while maintaining hip precautions. Next, completed ambulatory transfer with minA and RW to mat table - cues for long steps and slowed speed. [facility], worked on some exercises for LE strengthening, alternating LAQ, ankle pumps, and 2x5 sit<>stands with minA and RW (elevated mat table). Returned to his w/c via stand<>pivot transfer with minA and RW - showing improvement in stepping technique and safety approach to sitting surfaces. Returned to his room and remained seated in w/c with safety belt alarm on and all needs within reach.   Therapy Documentation Precautions:  Precautions Precautions: Posterior Hip, Fall Precaution Comments: verbally reviewed  Required Braces or Orthoses: Knee Immobilizer - Right Knee Immobilizer - Right:  (in bed only, off with therapies) Restrictions Weight Bearing Restrictions: Yes RLE Weight Bearing: Weight bearing as tolerated Other Position/Activity Restrictions: posterior hip precautions R LE General:    Therapy/Group: Individual Therapy  Alger Simons 11/28/2020, 7:31 AM

## 2020-11-28 NOTE — Plan of Care (Signed)
  Problem: Consults Goal: RH GENERAL PATIENT EDUCATION Description: See Patient Education module for education specifics. Outcome: Progressing   Problem: RH BOWEL ELIMINATION Goal: RH STG MANAGE BOWEL WITH ASSISTANCE Description: STG Manage Bowel with mod I Assistance. Outcome: Progressing Goal: RH STG MANAGE BOWEL W/MEDICATION W/ASSISTANCE Description: STG Manage Bowel with Medication with  mod I Assistance. Outcome: Progressing   Problem: RH BLADDER ELIMINATION Goal: RH STG MANAGE BLADDER WITH ASSISTANCE Description: STG Manage Bladder With mod I Assistance Outcome: Progressing   Problem: RH SKIN INTEGRITY Goal: RH STG SKIN FREE OF INFECTION/BREAKDOWN Description: W min assist Outcome: Progressing   Problem: RH SAFETY Goal: RH STG ADHERE TO SAFETY PRECAUTIONS W/ASSISTANCE/DEVICE Description: STG Adhere to Safety Precautions With cues Assistance/Device. Outcome: Progressing   Problem: RH PAIN MANAGEMENT Goal: RH STG PAIN MANAGED AT OR BELOW PT'S PAIN GOAL Description: At or below level 4 Outcome: Progressing   Problem: RH KNOWLEDGE DEFICIT GENERAL Goal: RH STG INCREASE KNOWLEDGE OF SELF CARE AFTER HOSPITALIZATION Description: Patient's wife and sons will be able to manage care at discharge using handouts and educational resources independently Outcome: Progressing

## 2020-11-28 NOTE — Progress Notes (Signed)
Speech Language Pathology Daily Session Note  Patient Details  Name: Jon Reid MRN: [id number] Date of Birth: December 13, 1928  Today's Date: 11/28/2020 SLP Individual Time: 1300-1330 SLP Individual Time Calculation (min): 30 min  Short Term Goals: Week 1: SLP Short Term Goal 1 (Week 1): Patient will participate in objective evaluation of swallow function to determine if any oropharyngeal dysphagia. SLP Short Term Goal 2 (Week 1): SLP to determine if further dysphagia tx warranted pending MBS> SLP Short Term Goal 2 - Progress (Week 1): Met SLP Short Term Goal 3 (Week 1): Patient will tolerate current PO diet with mod I for use of swallow strategies.  Skilled Therapeutic Interventions: Patient agreeable to skilled ST intervention with focus on swallowing goals. Patient had just completed noon meal upon arrival. Patient denied any difficulty or coughing episodes with meal. He was observed consuming single and sequential sips of thin liquids through straw with no overt s/sx of aspiration. SLP facilitated further education on use of swallowing precautions. Patient recalled importance of remaining upright 45 minutes after intake. He was unable to recall additional swallowing strategies discussed in previous session following recent MBS. SLP reviewed additional strategies including multiple swallows and effortful swallow. Patient was able to teach back at end of session with min A verbal cues for accuracy. Recommend continued f/u of diet tolerance and recall of strategies. Patient was left in chair with alarm activated and immediate needs within reach at end of session. Continue per current plan of care.      Pain Pain Assessment Pain Scale: 0-10 Pain Score: 0-No pain  Therapy/Group: Individual Therapy  Patty Sermons 11/28/2020, 1:27 PM

## 2020-11-28 NOTE — Plan of Care (Signed)
  Problem: RH BOWEL ELIMINATION Goal: RH STG MANAGE BOWEL WITH ASSISTANCE Description: STG Manage Bowel with mod I Assistance. Outcome: Progressing Goal: RH STG MANAGE BOWEL W/MEDICATION W/ASSISTANCE Description: STG Manage Bowel with Medication with  mod I Assistance. Outcome: Progressing   Problem: RH BLADDER ELIMINATION Goal: RH STG MANAGE BLADDER WITH ASSISTANCE Description: STG Manage Bladder With mod I Assistance Outcome: Progressing   Problem: RH SKIN INTEGRITY Goal: RH STG SKIN FREE OF INFECTION/BREAKDOWN Description: W min assist Outcome: Progressing   Problem: RH SAFETY Goal: RH STG ADHERE TO SAFETY PRECAUTIONS W/ASSISTANCE/DEVICE Description: STG Adhere to Safety Precautions With cues Assistance/Device. Outcome: Progressing

## 2020-11-29 LAB — CBC WITH DIFFERENTIAL/PLATELET
Abs Immature Granulocytes: 0.15 10*3/uL — ABNORMAL HIGH (ref 0.00–0.07)
Basophils Absolute: 0.1 10*3/uL (ref 0.0–0.1)
Basophils Relative: 1 %
Eosinophils Absolute: 0.2 10*3/uL (ref 0.0–0.5)
Eosinophils Relative: 3 %
HCT: 28.4 % — ABNORMAL LOW (ref 39.0–52.0)
Hemoglobin: 9 g/dL — ABNORMAL LOW (ref 13.0–17.0)
Immature Granulocytes: 2 %
Lymphocytes Relative: 17 %
Lymphs Abs: 1.6 10*3/uL (ref 0.7–4.0)
MCH: 30.2 pg (ref 26.0–34.0)
MCHC: 31.7 g/dL (ref 30.0–36.0)
MCV: 95.3 fL (ref 80.0–100.0)
Monocytes Absolute: 0.9 10*3/uL (ref 0.1–1.0)
Monocytes Relative: 10 %
Neutro Abs: 6.5 10*3/uL (ref 1.7–7.7)
Neutrophils Relative %: 67 %
Platelets: 497 10*3/uL — ABNORMAL HIGH (ref 150–400)
RBC: 2.98 MIL/uL — ABNORMAL LOW (ref 4.22–5.81)
RDW: 16.2 % — ABNORMAL HIGH (ref 11.5–15.5)
WBC: 9.5 10*3/uL (ref 4.0–10.5)
nRBC: 0 % (ref 0.0–0.2)

## 2020-11-29 LAB — BASIC METABOLIC PANEL
Anion gap: 8 (ref 5–15)
BUN: 14 mg/dL (ref 8–23)
CO2: 23 mmol/L (ref 22–32)
Calcium: 8.2 mg/dL — ABNORMAL LOW (ref 8.9–10.3)
Chloride: 104 mmol/L (ref 98–111)
Creatinine, Ser: 1.21 mg/dL (ref 0.61–1.24)
GFR, Estimated: 57 mL/min — ABNORMAL LOW (ref >60)
Glucose, Bld: 107 mg/dL — ABNORMAL HIGH (ref 70–99)
Potassium: 4.2 mmol/L (ref 3.5–5.1)
Sodium: 135 mmol/L (ref 135–145)

## 2020-11-29 MED ORDER — AMOXICILLIN-POT CLAVULANATE 500-125 MG PO TABS
1.0000 | ORAL_TABLET | Freq: Two times a day (BID) | ORAL | Status: AC
  Administered 2020-11-29 – 2020-12-02 (×8): 500 mg via ORAL
  Filled 2020-11-29 (×8): qty 1

## 2020-11-29 MED ORDER — HYDROCODONE-ACETAMINOPHEN 5-325 MG PO TABS: 1.0000 | ORAL_TABLET | Freq: Every day | ORAL | Status: DC | PRN

## 2020-11-29 MED ORDER — POTASSIUM CHLORIDE 20 MEQ PO PACK
20.0000 meq | PACK | Freq: Two times a day (BID) | ORAL | Status: DC
  Administered 2020-11-29 – 2020-11-30 (×2): 20 meq via ORAL
  Filled 2020-11-29 (×2): qty 1

## 2020-11-29 NOTE — Progress Notes (Signed)
Occupational Therapy Session Note  Patient Details  Name: Jon Reid MRN: [id number] Date of Birth: 18-Jan-1929  Today's Date: 11/29/2020 OT Individual Time: [id number]   &   1445-1530 OT Individual Time Calculation (min): 43 min    &   45 min   Short Term Goals: Week 1:  OT Short Term Goal 1 (Week 1): Pt will sit to stand from EOB to RW with min A. OT Short Term Goal 2 (Week 1): Pt will be able to stand at [facility] with S while therapist A him with managing clothing over hips. OT Short Term Goal 3 (Week 1): Pt will be able to don pants over feet with reacher with min A. OT Short Term Goal 4 (Week 1): Pt will be able to self cleanse post toileting with mod A.  Skilled Therapeutic Interventions/Progress Updates:    AM session:   Patient seated in w/c, alert and ready for therapy session.  He states that he is "bored".   He denies pain at this time.  Assisted to gym via w/c.   Sit to stand and short distance ambulation with RW to/from w/c and mat table with CGA.  Completed unsupported sitting posture and upper body conditioning activities.  Completed standing reach and tolerance activities.  He recalls 2/3 THPs.  Ambulated 40 feet with CGA.  He remained seated in w.c at close of session, seat belt alarm set and call bell in hand.      PM session:   Patient seated in w/c, states that he does not have pain but is cold from sitting.  Assisted to therapy gym via w/c.  Completed standing bean bag reach and toss activity with CS/CGA.  Completed bilateral ankle/knee ROM, light stretch to right knee, reviewed edema management.  Completed ambulation with RW x >100 feet with CGA, min cues for posture and step length.  He requested to stay up in w/c at close of session, bilateral legs elevated, seat belt alarm set and call bell in reach.      Therapy Documentation Precautions:  Precautions Precautions: Posterior Hip, Fall Precaution Comments: verbally reviewed Required Braces or Orthoses: Knee  Immobilizer - Right Knee Immobilizer - Right:  (in bed only, off with therapies) Restrictions Weight Bearing Restrictions: Yes RLE Weight Bearing: Weight bearing as tolerated Other Position/Activity Restrictions: posterior hip precautions R LE   Therapy/Group: Individual Therapy  Carlos Levering 11/29/2020, 7:42 AM

## 2020-11-29 NOTE — Progress Notes (Signed)
Physical Therapy Session Note  Patient Details  Name: Jon Reid MRN: [id number] Date of Birth: 1928-05-09  Today's Date: 11/29/2020 PT Individual Time: [id number] PT Individual Time Calculation (min): 70 min   Short Term Goals: Week 2:  PT Short Term Goal 1 (Week 2): Pt will complete bed mobility at minA level PT Short Term Goal 2 (Week 2): Pt will complete bed<>chair transfers with minA and LRAD PT Short Term Goal 3 (Week 2): Pt will ambulate 20f with minA and LRAD  Skilled Therapeutic Interventions/Progress Updates:     Pt in bed at start of session - agreeable to therapy but requests assistance to use urinal. Provided urinal and pt continent of bladder. Pt reports no pain but does grimmace with RLE ROM during bed level dressing. Donned TED's and shoes with totalA for time. Required maxA for donning shorts via bridging technique. Required minA for supine<>sit for RLE management with HOB flat - may benefit from AD such as leg lifter for this at a later date. Sit's unsupported at EOB with supervision and allowed some time to acclimate to upright position. Completed sit<>Stand to RW from elevated bed height with min assist and then completed ambulatory transfer with minA and RW to TIS w/c. Asked RN to assist with threading IV bag into t-shirt as pt requesting to get out of his hospital gown. Shirt donned with supervision assist. Pt wheeled to day room rehab gym for time. Assisted to Nustep via stand pivot transfer with minA and RW. Required setupA for Nustep and completed 10 minutes total at workload of 5, focusing on AAROM for R hip/knee and global strengthening/endurance training. Pt then worked on gPersonnel officer- ambulated 2x153fwith CGA and RW, minA needed for safety approach for sitting but did well with reaching back towards sitting surface with less cues compared to yesterday. Verbal cues during gait for increasing stride length and keeping body within walker frame. No LOB or knee  buckling present. Retrieved a RW bag to allow pt increase functional indep with household gait. Completed seated LAQ for concluding session and then assisted back to his w/c with minA and RW. Returned to his room where he remained seated in w/c with safety belt alarm on and all his needs within reach.   Therapy Documentation Precautions:  Precautions Precautions: Posterior Hip, Fall Precaution Comments: verbally reviewed Required Braces or Orthoses: Knee Immobilizer - Right Knee Immobilizer - Right:  (in bed only, off with therapies) Restrictions Weight Bearing Restrictions: Yes RLE Weight Bearing: Weight bearing as tolerated Other Position/Activity Restrictions: posterior hip precautions R LE General:    Therapy/Group: Individual Therapy  ChAlger Simons/13/2022, 7:34 AM

## 2020-11-29 NOTE — Progress Notes (Signed)
Speech Language Pathology Daily Session Note  Patient Details  Name: Jon Reid MRN: [id number] Date of Birth: January 25, 1929  Today's Date: 11/29/2020 SLP Individual Time: 0802-0830 SLP Individual Time Calculation (min): 28 min  Short Term Goals: Week 1: SLP Short Term Goal 1 (Week 1): Patient will participate in objective evaluation of swallow function to determine if any oropharyngeal dysphagia. SLP Short Term Goal 2 (Week 1): SLP to determine if further dysphagia tx warranted pending MBS> SLP Short Term Goal 2 - Progress (Week 1): Met SLP Short Term Goal 3 (Week 1): Patient will tolerate current PO diet with mod I for use of swallow strategies.  Skilled Therapeutic Interventions:Skilled ST services focused on swallow skills. Pt was consuming regular textures and thin liquids via cup/straw, breakfast tray with NT present. Pt was seated at 90 degrees in bed and was consuming small bites/sips mod I. Pt was only able to recall sitting up for 30 minute, however new recommendation is 45 minutes. SLP facilitated education of swallow strategies and posted list/visual aid in room ( sit up, small bites/sips, effortful swallow, multiple swallows and remaining sitting up for 45 minutes.) pt was able to read and recall strategies with min A verbal cues for use of visual aid. Pt demonstrated intermittent dry cough and throat clearing throughout meal. Pt attributes coughs to baseline chest congestion ( pt reports cough with and without PO intake) verse pharyngeal residue or a s/s of aspiration. Pt required min A fading to supervision A to use swallow strategies during meal. Pt was left in room with call bell within reach and bed alarm set. SLP recommends to continue skilled services.      Pain Pain Assessment Pain Score: 0-No pain  Therapy/Group: Individual Therapy  Alwyn Cordner  [facility] 11/29/2020, 12:04 PM

## 2020-11-29 NOTE — Progress Notes (Addendum)
PROGRESS NOTE   Denies complaints this morning Continues to have cough. Discussed that his WBC has normalized, we are continuing IV abx.  Cr improved to 1.21 today  ROS: Patient denies fever, rash, sore throat, blurred vision, nausea, vomiting, diarrhea, or chest pain,   headache, or mood change. +cough   Objective:   No results found. Recent Labs    11/28/20 0659 11/29/20 0722  WBC 11.5* 9.5  HGB 8.5* 9.0*  HCT 26.1* 28.4*  PLT 465* 497*   Recent Labs    11/28/20 0659 11/29/20 0722  NA 136 135  K 4.2 4.2  CL 109 104  CO2 21* 23  GLUCOSE 120* 107*  BUN 15 14  CREATININE 1.30* 1.21  CALCIUM 8.1* 8.2*    Intake/Output Summary (Last 24 hours) at 11/29/2020 1015 Last data filed at 11/29/2020 [id number] Gross per 24 hour  Intake --  Output 550 ml  Net -550 ml     Pressure Injury 11/17/20 Sacrum Mid Stage 2 -  Partial thickness loss of dermis presenting as a shallow open injury with a red, pink wound bed without slough. round red shallow crater surrounded by denuded area with serous drainage (Active)  11/17/20 0315  Location: Sacrum  Location Orientation: Mid  Staging: Stage 2 -  Partial thickness loss of dermis presenting as a shallow open injury with a red, pink wound bed without slough.  Wound Description (Comments): round red shallow crater surrounded by denuded area with serous drainage  Present on Admission: Yes    Physical Exam: Vital Signs Blood pressure 139/60, pulse 62, temperature 97.6 F (36.4 C), temperature source Oral, resp. rate 16, height 6' (1.829 m), weight 67.9 kg, SpO2 98 %. Gen: no distress, normal appearing HEENT: oral mucosa pink and moist, NCAT Cardio: Reg rate Chest: normal effort, normal rate of breathing Abd: soft, non-distended Ext: no edema Psych: pleasant, normal affect   Musculoskeletal:     Cervical back: Normal range of motion. No rigidity.     Comments: Right hip incision  covered with steri-strips.  Moderate edema, no erythema seen.  UE 5-/5 B/L RLE- HF 3/5- in brace from mid thigh to R ankle- cannot test- knee; DF/PF 4-/5 LLE- 4-/5 Skin:    Comments: Bilateral forearms with multiple ecchymotic areas.   Peeling skin on top of head with actinic keratosis.   Sacral wound Neurological:     Mental Status: He is alert and oriented to person, place, and time.     Comments: Decreased hearing. Speech clear. Able to follow simple commands without difficulty.  Intact to light touch x 4     Assessment/Plan: 1. Functional deficits which require 3+ hours per day of interdisciplinary therapy in a comprehensive inpatient rehab setting. Physiatrist is providing close team supervision and 24 hour management of active medical problems listed below. Physiatrist and rehab team continue to assess barriers to discharge/monitor patient progress toward functional and medical goals  Care Tool:  Bathing    Body parts bathed by patient: Right arm, Left arm, Chest, Abdomen, Front perineal area, Face, Right upper leg, Left upper leg   Body parts bathed by helper: Buttocks     Bathing assist Assist  Level: Moderate Assistance - Patient 50 - 74%     Upper Body Dressing/Undressing Upper body dressing   What is the patient wearing?: Hospital gown only    Upper body assist Assist Level: Maximal Assistance - Patient 25 - 49%    Lower Body Dressing/Undressing Lower body dressing      What is the patient wearing?: Pants     Lower body assist Assist for lower body dressing: Maximal Assistance - Patient 25 - 49% (with reacher)     Toileting Toileting    Toileting assist Assist for toileting: Total Assistance - Patient < 25% (urinal)     Transfers Chair/bed transfer  Transfers assist     Chair/bed transfer assist level: Minimal Assistance - Patient > 75%     Locomotion Ambulation   Ambulation assist   Ambulation activity did not occur: Safety/medical  concerns  Assist level: Minimal Assistance - Patient > 75% Assistive device: Walker-rolling Max distance: 95   Walk 10 feet activity   Assist  Walk 10 feet activity did not occur: Safety/medical concerns  Assist level: Minimal Assistance - Patient > 75% Assistive device: Walker-rolling   Walk 50 feet activity   Assist Walk 50 feet with 2 turns activity did not occur: Safety/medical concerns  Assist level: Minimal Assistance - Patient > 75% Assistive device: Walker-rolling    Walk 150 feet activity   Assist Walk 150 feet activity did not occur: Safety/medical concerns         Walk 10 feet on uneven surface  activity   Assist Walk 10 feet on uneven surfaces activity did not occur: Safety/medical concerns         Wheelchair     Assist Is the patient using a wheelchair?: Yes Type of Wheelchair: Manual Wheelchair activity did not occur: Refused         Wheelchair 50 feet with 2 turns activity    Assist    Wheelchair 50 feet with 2 turns activity did not occur: Refused       Wheelchair 150 feet activity     Assist  Wheelchair 150 feet activity did not occur: Refused       Blood pressure 139/60, pulse 62, temperature 97.6 F (36.4 C), temperature source Oral, resp. rate 16, height 6' (1.829 m), weight 67.9 kg, SpO2 98 %.  Medical Problem List and Plan: 1.  R hip fx s/p hemiarthroplasty secondary to multiple falls             -patient may  shower if covered       -ELOS/Goals: 16-18 days- min Assist -Continue CIR therapies including PT, OT, and SLP  -Interdisciplinary Team Conference today   2.  Antithrombotics: -DVT/anticoagulation:  Pharmaceutical: Continue to hold Lovenox given recent drop in hgb, transfusion  -TEDS, SCD's             -antiplatelet therapy: On ASA bid for DVT prophylaxis?-->decrease to once a day as on lovenox.  3. Pain: Decrease Norco to daily prn for severe pain, tylenol prn.  4. Mood: LCSW to follow for  evaluation and  support.              -antipsychotic agents: N/A 5. Neuropsych: This patient is not fully capable of making decisions on his own behalf. 6. Skin/Wound Care: Monitor wound for healing.  7. Fluids/Electrolytes/Nutrition: Monitor I/O.    -encourage fluids 8. Cough: Continue Mucinex to 2 tabs BID, abx treatment  9. GERD: Continue Protonix BID.  10. Parkinson's disease: Managed with  Sinemet IR TID 11. CAD s/p CABG: Monitor for symptoms with increase in activity. Continue ASA.              --hold lasix due to intermittent low BP.  12. Acute on chronic anemia due to blood loss anemia: Hgb reviewed 9/2- decreased to 8.2, 8.1 on 9/10, 8.5 on 9/12- repeat tomorrow             --resumed iron supplement.  13. Constipation: Continue Senna S 14. Hypotension: medications reviewed, not on any medications for hypertension.  Vitals:   11/28/20 1953 11/29/20 0522  BP: 115/62 139/60  Pulse: 70 62  Resp: 16 16  Temp: 98.5 F (36.9 C) 97.6 F (36.4 C)  SpO2: 100% 98%    15. Hypokalemia: continue 47mq BID 9/10 still 3.3,  increase to 47m bid for 2 days 16. Aspiration pneumonia: continue IV vanc and zosyn, WBC normalized and confusion resolved  -9/11 low grade temp---encourage IS   -already on broad spectrum covg   -no concerning findings on exam  9/12: rising leukocytosis to 12.5- repeat CBC tomorrow.  17. Insomnia: replaced trazodone with melatonin given hallucinations with former. Seems to be working 18. Mild to mod oropharyngeal dysphagia per MBS 9/9, likely esophageal component d/t HH. On regular diet -meds with puree -slow rate, small sips and bites -continue inpatient SLP.  Min S with eating, goals are modI 19. AKI: placed nursing order to encourage 6-8 glasses of water per day, resolved. Continue to monitor creatinine weekly.  20. Disposition: d/c 9/22 (21 days). Trying to contact sons to update.     LOS: 12 days A FACE TO FACE EVALUATION WAS PERFORMED  Audreyanna Butkiewicz P  Tylon Kemmerling 11/29/2020, 10:15 AM

## 2020-11-29 NOTE — Progress Notes (Signed)
Occupational Therapy Session Note  Patient Details  Name: Jon Reid MRN: [id number] Date of Birth: November 01, 1928  Today's Date: 11/29/2020 OT Individual Time: 1032-1100 OT Individual Time Calculation (min): 28 min    Short Term Goals: Week 1:  OT Short Term Goal 1 (Week 1): Pt will sit to stand from EOB to RW with min A. OT Short Term Goal 2 (Week 1): Pt will be able to stand at [facility] with S while therapist A him with managing clothing over hips. OT Short Term Goal 3 (Week 1): Pt will be able to don pants over feet with reacher with min A. OT Short Term Goal 4 (Week 1): Pt will be able to self cleanse post toileting with mod A.   Skilled Therapeutic Interventions/Progress Updates:  Patient met seated in TIS wc in agreement with OT treatment session. 0/10 pain reported at rest and with activity. Total A for wc transport to dayroom. Session with focus on sit to stand transfers, static/dynamic standing balance while reaching outside of BOS and activity tolerance. Sit to stand from TIS x4 with Min guard and min cues for hand placement. Functional mobility up to 15f with use of RW and Min A. Session concluded with patient seated in TIS wc with call bell within reach, belt alarm activated and all needs met.   Therapy Documentation Precautions:  Precautions Precautions: Posterior Hip, Fall Precaution Comments: verbally reviewed Required Braces or Orthoses: Knee Immobilizer - Right Knee Immobilizer - Right:  (in bed only, off with therapies) Restrictions Weight Bearing Restrictions: Yes RLE Weight Bearing: Weight bearing as tolerated Other Position/Activity Restrictions: posterior hip precautions R LE General:    Therapy/Group: Individual Therapy  Mersadie Kavanaugh R Howerton-Davis 11/29/2020, 8:59 AM

## 2020-11-29 NOTE — Patient Care Conference (Signed)
Inpatient RehabilitationTeam Conference and Plan of Care Update Date: 11/29/2020   Time: 13:12 PM    Patient Name: Jon Reid      Medical Record Number: [id number]  Date of Birth: 09-Nov-1928 Sex: Male         Room/Bed: 4W08C/4W08C-01 Payor Info: Payor: MEDICARE RAILROAD / Plan: MEDICARE RAILROAD / Product Type: *No Product type* /    Admit Date/Time:  11/17/2020  2:52 PM  Primary Diagnosis:  Femur fracture, right [facility])  Hospital Problems: Principal Problem:   Femur fracture, right ([place]) Active Problems:   S/P hip hemiarthroplasty   Pressure injury of skin   Closed fracture of right hip requiring operative repair, sequela   Protein-calorie malnutrition, severe    Expected Discharge Date: Expected Discharge Date: 12/08/20  Team Members Present: Physician leading conference: Dr. Leeroy Cha Social Worker Present: Ovidio Kin, LCSW Nurse Present: Dorien Chihuahua, RN PT Present: Ginnie Smart, PT OT Present: Turner Daniels, OT SLP Present: Charolett Bumpers, SLP     Current Status/Progress Goal Weekly Team Focus  Bowel/Bladder             Swallow/Nutrition/ Hydration   chronic, exacerbated oropharyngeal dysphagia, h/o esophageal dysphagia - at sup-to-min A for recall of swallow safety strategies         ADL's   Mod A bathing EOB, Max A LB ADLs, set-up UB ADLs, Mod A toileting.  S- MOD I goals  Self-care re-education with use of AE, standing balance, functional transfers and precaution education   Mobility   modA bed mobility, modA stand<>pivot transfers wtih RW (primarily just for powering to stand, minA for balance during transfer). Gait ~141f with minA and RW  Supervision (may not meet supervision goals)  Global strengthening and activity tolerance training, functional transfers, RLE strengthening, gait training, DC planning   Communication             Safety/Cognition/ Behavioral Observations            Pain             Skin                Discharge Planning:      Team Discussion: Abx for pna ongoing but tapering. Anemia address, AKI resolved and Cough addressed with medications and pulmonary toileting.   Patient on target to meet rehab goals: yes, currently max assist for toileting and min assist for upper body ADLs. Needs min assist to ambulate 150' with a RW. Goals for discharge set at supervision level overall.  *See Care Plan and progress notes for long and short-term goals.   Revisions to Treatment Plan:  Address chronic esophogeal aphasia, strategies and recall of strategies Working on safety awareness, standing tolerance and maintaining standing for hygiene/clothes management. Teaching Needs: Safety, skin care, medications, etc  Current Barriers to Discharge: Decreased caregiver support, Home enviroment access/layout, and Weight bearing restrictions  Possible Resolutions to Barriers: Family education     Medical Summary Current Status: aspiration pneumonia, cough, leukocytosis has resolved, constipation, insomnia, acute kidney injury  Barriers to Discharge: Decreased family/caregiver support;Medical stability;Wound care  Barriers to Discharge Comments: aspiration pneumonia, cough, constipation, insomnia, acute kidney injury, right hip pain Possible Resolutions to Barriers/Weekly Focus: transition IV antibiotics to oral and complete in three days, continue mucinex BID, continue IV fluids   Continued Need for Acute Rehabilitation Level of Care: The patient requires daily medical management by a physician with specialized training in physical medicine and rehabilitation for the following reasons: Direction  of a multidisciplinary physical rehabilitation program to maximize functional independence : Yes Medical management of patient stability for increased activity during participation in an intensive rehabilitation regime.: Yes Analysis of laboratory values and/or radiology reports with any subsequent need for  medication adjustment and/or medical intervention. : Yes   I attest that I was present, lead the team conference, and concur with the assessment and plan of the team.   Dorien Chihuahua B 11/29/2020, 2:21 PM

## 2020-11-30 NOTE — Progress Notes (Signed)
Physical Therapy Session Note  Patient Details  Name: Jon Reid MRN: [id number] Date of Birth: [birth year]  Today's Date: 11/30/2020 PT Individual Time: 1105-1207 PT Individual Time Calculation (min): 62 min   Short Term Goals: Week 2:  PT Short Term Goal 1 (Week 2): Pt will complete bed mobility at minA level PT Short Term Goal 2 (Week 2): Pt will complete bed<>chair transfers with minA and LRAD PT Short Term Goal 3 (Week 2): Pt will ambulate 22f with minA and LRAD  Skilled Therapeutic Interventions/Progress Updates:    Pt received sitting in TIS w/c and agreeable to therapy session. Denies specific R LE pain throughout session but does state pain that travels between B hips when coughing - pt frequently coughing throughout session, notified nurse and reports premedicated. Donned B LE TED hose and shoes total assist.  Sit<>stands using RW with CGA for safety during session - pt pushing up with B UEs from seat and keeping R LE slightly more extended with increased WBing down through LLE during transfers.   Gait training ~[id number] to/from main therapy gym using RW with light min assist - during gait to gym at beginning pt demos more shuffled gait pattern with decreased B LE foot clearance, varying step-to leading with R LE versus reciprocal stepping pattern, intermittent R knee "sag" but not true buckle during stance with pt ale to compensate using B UE support on RW to maintain upright, and overall flexed trunk posturing - during gait back to room at end of session continues to demo varying step-to versus reciprocal stepping pattern but improved foot clearance and step lengths along with no instances of R knee "sag" into flexion during stance.  Seated R LE long arc quads x15reps no weight progressed to 2x15reps with 4lb ankle weight targeting increased quad strengthening - provided external target to increase knee extension ROM. Pt has significant posterior pelvic tilt when performing knee  extension demonstrating hamstring length shortening - performed seated R LE hamstring stretch x1 minute.  Standing with RW focusing on R LE hip/knee extension activation while performing L LE foot taps on/off  4" step with CGA and tactile cuing at R quads and glutes. Transitioned to focus on increased R hip flexion strength via repeated R LE foot taps on/off 4" step with CGA. Progressed to alternating B LE foot taps on 4" step with alternating R LE flexor activation to R LE extensor activation during stance - requires repeated cuing to maintain R LE hip/knee extension. Throughout standing/gait tasks with RW pt keeps shoulders elevated despite cuing for improvement.  Sit<>supine on mat table min assist for R LE management. Supine R LE hip flexor and hamstring stretch via supine lying for 2 minutes. Supine R LE heel slides 2x15 reps with cuing for proper LE alignment.  At end of session pt left sitting tilted back in TIS w/c with needs in reach and seat belt alarm on.  Therapy Documentation Precautions:  Precautions Precautions: Posterior Hip, Fall Precaution Comments: verbally reviewed Required Braces or Orthoses: Knee Immobilizer - Right Knee Immobilizer - Right:  (in bed only, off with therapies) Restrictions Weight Bearing Restrictions: Yes RLE Weight Bearing: Weight bearing as tolerated Other Position/Activity Restrictions: posterior hip precautions R LE   Pain:  Details above.   Therapy/Group: Individual Therapy  CaTawana Scale PT, DPT, NCS, CSRS 11/30/2020, 7:52 AM

## 2020-11-30 NOTE — Progress Notes (Signed)
Occupational Therapy Session Note  Patient Details  Name: Jon Reid MRN: [id number] Date of Birth: April 08, 1928  Today's Date: 11/30/2020 OT Individual Time: [id number] OT Individual Time Calculation (min): 55 min    Short Term Goals: Week 1:  OT Short Term Goal 1 (Week 1): Pt will sit to stand from EOB to RW with min A. OT Short Term Goal 2 (Week 1): Pt will be able to stand at [facility] with S while therapist A him with managing clothing over hips. OT Short Term Goal 3 (Week 1): Pt will be able to don pants over feet with reacher with min A. OT Short Term Goal 4 (Week 1): Pt will be able to self cleanse post toileting with mod A.  Skilled Therapeutic Interventions/Progress Updates:    Pt resting in bed upon arrival and ready to get OOB to wash up and don clothing. Pt required assistance moving RLE off EOB. Sit<>stand and stand pivot transfer to w/c with CGA. Bathing/dressing with sit<>stand at sink with CGA for standing balance. Pt required assistance threading BLE into pants without use of reacher. Pt required asistance fastening pants but was able to zip pants. Pt verbalized 2/3 hip precautions. Pt requires more then a reasonable amount of time to complete tasks. Pt reamined in TIS w/c with all needs within reach and belt alarm activated. See Care Tool for assist levels.   Therapy Documentation Precautions:  Precautions Precautions: Posterior Hip, Fall Precaution Comments: verbally reviewed Required Braces or Orthoses: Knee Immobilizer - Right Knee Immobilizer - Right:  (in bed only, off with therapies) Restrictions Weight Bearing Restrictions: Yes RLE Weight Bearing: Weight bearing as tolerated Other Position/Activity Restrictions: posterior hip precautions R LE Pain: Pt denies pain this morning  Therapy/Group: Individual Therapy  Leroy Libman 11/30/2020, 10:29 AM

## 2020-11-30 NOTE — Progress Notes (Signed)
Speech Language Pathology Daily Session Note  Patient Details  Name: Jon Reid MRN: [id number] Date of Birth: 06/29/1928  Today's Date: 11/30/2020 SLP Individual Time: 0800-0830 SLP Individual Time Calculation (min): 30 min  Short Term Goals: Week 1: SLP Short Term Goal 1 (Week 1): Patient will participate in objective evaluation of swallow function to determine if any oropharyngeal dysphagia. SLP Short Term Goal 2 (Week 1): SLP to determine if further dysphagia tx warranted pending MBS> SLP Short Term Goal 2 - Progress (Week 1): Met SLP Short Term Goal 3 (Week 1): Patient will tolerate current PO diet with mod I for use of swallow strategies.  Skilled Therapeutic Interventions:   Patient seen for skilled ST session focusing on dysphagia goals. Patient was finishing breakfast when SLP arrived. He did exhibit cough and throat clearing with audible secretions. He did not expectorate any secretions during session but did report that he has been. SLP reviewed swallow safety strategies and positioning. Patient did report that when he is sitting up in chair or positioned correctly in bed, he does much better with swallowing. SLP recommending patient consume water more frequently during day to aid in thinning down mucous. Patient continues to benefit from skilled SLP intervention for dysphagia management but with expected discharge when education/training complete.  Pain Pain Assessment Pain Scale: Faces Pain Score: 0-No pain Faces Pain Scale: Hurts a little bit Pain Type: Acute pain Pain Location: Hip Pain Orientation: Left Pain Descriptors / Indicators: Aching Pain Onset: With Activity Pain Intervention(s): Other (Comment) (RN already aware)  Therapy/Group: Individual Therapy  Sonia Baller, MA, CCC-SLP Speech Therapy

## 2020-11-30 NOTE — Progress Notes (Signed)
Patient ID: Jon Reid, male   DOB: Dec 03, 1928, 87 y.o.   MRN: [id number]  Met with pt to update him regarding team conference progress toward is goals of supervision level and have left message with two son's-Lee and Jeani Hawking. He is happy he is doing better and feels he will get to supervision level at discharge. He needs to due to wife can not physically assist him and his son's are in and out at home. He has equipment and is agreeable to follow up. Will continue to work toward discharge next Thursday 9/22.

## 2020-11-30 NOTE — Progress Notes (Addendum)
PROGRESS NOTE   Continues to have cough Would prefer to leave earlier if he is able to progress quicker Having a BM every third day- does not want stool softeners  ROS: Patient denies fever, rash, sore throat, blurred vision, nausea, vomiting, diarrhea, or chest pain,   headache, or mood change. +cough   Objective:   No results found. Recent Labs    11/28/20 0659 11/29/20 0722  WBC 11.5* 9.5  HGB 8.5* 9.0*  HCT 26.1* 28.4*  PLT 465* 497*   Recent Labs    11/28/20 0659 11/29/20 0722  NA 136 135  K 4.2 4.2  CL 109 104  CO2 21* 23  GLUCOSE 120* 107*  BUN 15 14  CREATININE 1.30* 1.21  CALCIUM 8.1* 8.2*    Intake/Output Summary (Last 24 hours) at 11/30/2020 1131 Last data filed at 11/30/2020 0925 Gross per 24 hour  Intake 760 ml  Output 725 ml  Net 35 ml     Pressure Injury 11/17/20 Sacrum Mid Stage 2 -  Partial thickness loss of dermis presenting as a shallow open injury with a red, pink wound bed without slough. round red shallow crater surrounded by denuded area with serous drainage (Active)  11/17/20 0315  Location: Sacrum  Location Orientation: Mid  Staging: Stage 2 -  Partial thickness loss of dermis presenting as a shallow open injury with a red, pink wound bed without slough.  Wound Description (Comments): round red shallow crater surrounded by denuded area with serous drainage  Present on Admission: Yes    Physical Exam: Vital Signs Blood pressure (!) 142/64, pulse 66, temperature 98.1 F (36.7 C), temperature source Oral, resp. rate 16, height 6' (1.829 m), weight 67.9 kg, SpO2 97 %. Gen: no distress, normal appearing HEENT: oral mucosa pink and moist, NCAT Cardio: Reg rate Chest: normal effort, normal rate of breathing Abd: soft, non-distended Ext: no edema Psych: pleasant, normal affect  Musculoskeletal:     Cervical back: Normal range of motion. No rigidity.     Comments: Right hip  incision covered with steri-strips.  Moderate edema, no erythema seen.  UE 5-/5 B/L RLE- HF 3/5- in brace from mid thigh to R ankle- cannot test- knee; DF/PF 4-/5 LLE- 4-/5 Skin:    Comments: Bilateral forearms with multiple ecchymotic areas.   Peeling skin on top of head with actinic keratosis.   Sacral wound Neurological:     Mental Status: He is alert and oriented to person, place, and time.     Comments: Decreased hearing. Speech clear. Able to follow simple commands without difficulty.  Intact to light touch x 4     Assessment/Plan: 1. Functional deficits which require 3+ hours per day of interdisciplinary therapy in a comprehensive inpatient rehab setting. Physiatrist is providing close team supervision and 24 hour management of active medical problems listed below. Physiatrist and rehab team continue to assess barriers to discharge/monitor patient progress toward functional and medical goals  Care Tool:  Bathing    Body parts bathed by patient: Right arm, Left arm, Chest, Abdomen, Front perineal area, Right upper leg, Left upper leg, Face   Body parts bathed by helper: Buttocks, Right lower leg, Left  lower leg     Bathing assist Assist Level: Moderate Assistance - Patient 50 - 74%     Upper Body Dressing/Undressing Upper body dressing   What is the patient wearing?: Pull over shirt    Upper body assist Assist Level: Supervision/Verbal cueing    Lower Body Dressing/Undressing Lower body dressing      What is the patient wearing?: Pants     Lower body assist Assist for lower body dressing: Moderate Assistance - Patient 50 - 74%     Toileting Toileting    Toileting assist Assist for toileting: Total Assistance - Patient < 25% (urinal)     Transfers Chair/bed transfer  Transfers assist     Chair/bed transfer assist level: Minimal Assistance - Patient > 75%     Locomotion Ambulation   Ambulation assist   Ambulation activity did not occur:  Safety/medical concerns  Assist level: Minimal Assistance - Patient > 75% Assistive device: Walker-rolling Max distance: 95   Walk 10 feet activity   Assist  Walk 10 feet activity did not occur: Safety/medical concerns  Assist level: Minimal Assistance - Patient > 75% Assistive device: Walker-rolling   Walk 50 feet activity   Assist Walk 50 feet with 2 turns activity did not occur: Safety/medical concerns  Assist level: Minimal Assistance - Patient > 75% Assistive device: Walker-rolling    Walk 150 feet activity   Assist Walk 150 feet activity did not occur: Safety/medical concerns         Walk 10 feet on uneven surface  activity   Assist Walk 10 feet on uneven surfaces activity did not occur: Safety/medical concerns         Wheelchair     Assist Is the patient using a wheelchair?: Yes Type of Wheelchair: Manual Wheelchair activity did not occur: Refused         Wheelchair 50 feet with 2 turns activity    Assist    Wheelchair 50 feet with 2 turns activity did not occur: Refused       Wheelchair 150 feet activity     Assist  Wheelchair 150 feet activity did not occur: Refused       Blood pressure (!) 142/64, pulse 66, temperature 98.1 F (36.7 C), temperature source Oral, resp. rate 16, height 6' (1.829 m), weight 67.9 kg, SpO2 97 %.  Medical Problem List and Plan: 1.  R hip fx s/p hemiarthroplasty secondary to multiple falls             -patient may  shower if covered       -ELOS/Goals: 16-18 days- min Assist -Continue CIR therapies including PT, OT, and SLP  2.  Antithrombotics: -DVT/anticoagulation:  Pharmaceutical: Continue to hold Lovenox given recent drop in hgb, transfusion  -TEDS, SCD's             -antiplatelet therapy: On ASA bid for DVT prophylaxis?-->decrease to once a day as on lovenox.  3. Pain: Discontinue Norco, continue Tylenol PRN 4. Mood: LCSW to follow for evaluation and  support.               -antipsychotic agents: N/A 5. Neuropsych: This patient is not fully capable of making decisions on his own behalf. 6. Skin/Wound Care: Monitor wound for healing.  7. Fluids/Electrolytes/Nutrition: Monitor I/O.    -encourage fluids 8. Cough: Continue Mucinex to 2 tabs BID, abx treatment  9. GERD: Continue Protonix BID.  10. Parkinson's disease: Managed with Sinemet IR TID 11. CAD s/p CABG: Monitor for  symptoms with increase in activity. Continue ASA.              --hold lasix due to intermittent low BP.  12. Acute on chronic anemia due to blood loss anemia: increased nicely to 9.0 after 2U transfusion last week             --resumed iron supplement.  13. Constipation: Continue Senna S, having a BM every three days but does not want additional intervention at this time.  14. Hypotension: medications reviewed, not on any medications for hypertension.  Vitals:   11/29/20 1909 11/30/20 0327  BP: 126/64 (!) 142/64  Pulse: 71 66  Resp: 16 16  Temp: 98.2 F (36.8 C) 98.1 F (36.7 C)  SpO2: 97% 97%    15. Hypokalemia: discontinue supplement, repeat level on Monday 16. Aspiration pneumonia: WBC normalized, transitioned to oral abx, stop date entered 52. Insomnia: replaced trazodone with melatonin given hallucinations with former. Seems to be working 18. Mild to mod oropharyngeal dysphagia per MBS 9/9, likely esophageal component d/t HH. On regular diet -meds with puree -slow rate, small sips and bites -continue inpatient SLP.  Min S with eating, goals are modI 19. AKI: placed nursing order to encourage 6-8 glasses of water per day, resolved. Continue to monitor creatinine weekly.  20. Disposition: d/c 9/22 (21 days). Trying to contact sons [name] and Jeani Hawking to update.     LOS: 13 days A FACE TO FACE EVALUATION WAS PERFORMED  Jon Reid 11/30/2020, 11:31 AM

## 2020-11-30 NOTE — Progress Notes (Signed)
Physical Therapy Session Note  Patient Details  Name: Jon Reid MRN: [id number] Date of Birth: 09-24-1928  Today's Date: 11/30/2020 PT Individual Time: 1330-1430 PT Individual Time Calculation (min): 60 min   Short Term Goals: Week 2:  PT Short Term Goal 1 (Week 2): Pt will complete bed mobility at minA level PT Short Term Goal 2 (Week 2): Pt will complete bed<>chair transfers with minA and LRAD PT Short Term Goal 3 (Week 2): Pt will ambulate 18f with minA and LRAD  Skilled Therapeutic Interventions/Progress Updates:    Pt seen in w/c at start of session - reports he ate his whole lunch, full of spaghetti. Improved appetite compared to previous days. Pt denies any pain but reports fatigue from prior therapy sessions. Wheeled from his room to outside in his w/c to work on functional gait training outdoors on unlevel concrete surfaces. Completes sit<>stand to RW from w/c with minA and then ambulates ~1242fwith minA and RW - cues needed for keeping body within walker frame and working on step-to gait pattern to step-through gait pattern. He had x1 instance of soft knee buckling on the R that was self corrected. He required minA for sit<>stand from park bench with cues needed for hand placement, and then ambulated back to his w/c, ~12535fwith minA and RW with similar cues as above. Returned upstairs to rehab floor and worked on ambulating in ADL apartment suite with minA and RW as well as furniture transfers from low sitting sofa couch. He required modA for sit<>stand from this surface with difficulty producing power to rise and shifting his weight forwards. Worked on bed mobility in ADL apartment on regular height and flat bed - required modA for sit>supine for BLE management and minA for supine<>sit. Pt returned to his room at end of session where he requested to remain seated in w/c. Will need to change TIS w/c out for standard w/c. Pt completed session with all needs in reach and safety belt  alarm on.  Therapy Documentation Precautions:  Precautions Precautions: Posterior Hip, Fall Precaution Comments: verbally reviewed Required Braces or Orthoses: Knee Immobilizer - Right Knee Immobilizer - Right:  (in bed only, off with therapies) Restrictions Weight Bearing Restrictions: Yes RLE Weight Bearing: Weight bearing as tolerated Other Position/Activity Restrictions: posterior hip precautions R LE General:     Therapy/Group: Individual Therapy  ChrAlger Simons14/2022, 7:38 AM

## 2020-12-01 NOTE — Progress Notes (Signed)
Physical Therapy Session Note  Patient Details  Name: Jon Reid MRN: [id number] Date of Birth: 1928/06/05  Today's Date: 12/01/2020 PT Individual Time: [id number] PT Individual Time Calculation (min): 71 min   Short Term Goals: Week 2:  PT Short Term Goal 1 (Week 2): Pt will complete bed mobility at minA level PT Short Term Goal 2 (Week 2): Pt will complete bed<>chair transfers with minA and LRAD PT Short Term Goal 3 (Week 2): Pt will ambulate 52f with minA and LRAD  Skilled Therapeutic Interventions/Progress Updates:     Retrieved 18x18 standard manual w/c to substitute for her TIS w/c due to progress made. Pt seen supine in bed at start and requests for assistance in getting dressed for the day. Donned knee high TED's with totalA for time. Introduced leg lifter (gait belt) to use as AD for bed mobility to manage his weaker RLE. Able to come to sitting with minA and use of leg lifter and bed features. Donned shoes with totalA and donned shorts with maxA while seated EOB. He was able to change t-shirts with setupA although he required assist for buttoning his t-shirt. Sit<>stand to RW with minA and then ambulated in his room to his w/c with minA and RW - cues for hand placement and safety approach to sitting. Transported to main rehab gym for time. Completed stand<>pivot transfer with minA and RW to mat table. At mat table - worked on repeated sit<>stands, 1x10, to RW  - assist level fading from minA to mCoventry Health Careafter 5 reps. Pt struggles with standing from surfaces without an arm rests as he relies heavily on arms to assist. Completed 6MWT in hallway with RW and CGA - ambulated a total of 1852fwith seated rest break needed at minute 5 and unable to further ambulate within the 6 minutes. Age norm for >9063ears of age is 136882fAmbulated back to rehab gym, ~28f60fth CGA and RW. Worked on alternating toe taps to 5incThe [facility] RW support and minA for balance - 2x10 reps each leg. Completed  session with seated LAQ, unweighted, bilaterally with emphasis on terminal knee extension for RLE. Assisted back to his w/c via stand<>pivot transfer with minA and RW. Returned to his room and remained seated in w/c with safety belt alarm on, needs in reach.  Therapy Documentation Precautions:  Precautions Precautions: Posterior Hip, Fall Precaution Comments: verbally reviewed Required Braces or Orthoses: Knee Immobilizer - Right Knee Immobilizer - Right:  (in bed only, off with therapies) Restrictions Weight Bearing Restrictions: Yes RLE Weight Bearing: Weight bearing as tolerated Other Position/Activity Restrictions: posterior hip precautions R LE General:    Therapy/Group: Individual Therapy  ChriAlger Simons5/2022, 7:34 AM

## 2020-12-01 NOTE — Progress Notes (Signed)
PROGRESS NOTE   Continues to have cough No questions  ROS: Patient denies fever, rash, sore throat, blurred vision, nausea, vomiting, diarrhea, or chest pain,   headache, or mood change. +cough   Objective:   No results found. Recent Labs    11/29/20 0722  WBC 9.5  HGB 9.0*  HCT 28.4*  PLT 497*   Recent Labs    11/29/20 0722  NA 135  K 4.2  CL 104  CO2 23  GLUCOSE 107*  BUN 14  CREATININE 1.21  CALCIUM 8.2*    Intake/Output Summary (Last 24 hours) at 12/01/2020 1048 Last data filed at 12/01/2020 0754 Gross per 24 hour  Intake 480 ml  Output 1400 ml  Net -920 ml         Physical Exam: Vital Signs Blood pressure 134/62, pulse 67, temperature 98 F (36.7 C), resp. rate 18, height 6' (1.829 m), weight 67.9 kg, SpO2 96 %. Gen: no distress, normal appearing HEENT: oral mucosa pink and moist, NCAT Cardio: Reg rate Chest: normal effort, normal rate of breathing Abd: soft, non-distended Ext: no edema Psych: pleasant, normal affect  Musculoskeletal:     Cervical back: Normal range of motion. No rigidity.     Comments: Right hip incision covered with steri-strips.  Moderate edema, no erythema seen.  UE 5-/5 B/L RLE- HF 3/5- in brace from mid thigh to R ankle- cannot test- knee; DF/PF 4-/5 LLE- 4-/5 Skin:    Comments: Bilateral forearms with multiple ecchymotic areas.   Peeling skin on top of head with actinic keratosis.   Sacral wound Neurological:     Mental Status: He is alert and oriented to person, place, and time.     Comments: Decreased hearing. Speech clear. Able to follow simple commands without difficulty.  Intact to light touch x 4     Assessment/Plan: 1. Functional deficits which require 3+ hours per day of interdisciplinary therapy in a comprehensive inpatient rehab setting. Physiatrist is providing close team supervision and 24 hour management of active medical problems listed  below. Physiatrist and rehab team continue to assess barriers to discharge/monitor patient progress toward functional and medical goals  Care Tool:  Bathing    Body parts bathed by patient: Right arm, Left arm, Chest, Abdomen, Front perineal area, Right upper leg, Left upper leg, Face   Body parts bathed by helper: Buttocks, Right lower leg, Left lower leg     Bathing assist Assist Level: Moderate Assistance - Patient 50 - 74%     Upper Body Dressing/Undressing Upper body dressing   What is the patient wearing?: Pull over shirt    Upper body assist Assist Level: Supervision/Verbal cueing    Lower Body Dressing/Undressing Lower body dressing      What is the patient wearing?: Pants     Lower body assist Assist for lower body dressing: Moderate Assistance - Patient 50 - 74%     Toileting Toileting    Toileting assist Assist for toileting: Total Assistance - Patient < 25% (urinal)     Transfers Chair/bed transfer  Transfers assist     Chair/bed transfer assist level: Minimal Assistance - Patient > 75% Chair/bed transfer assistive device:  Walker   Locomotion Ambulation   Ambulation assist   Ambulation activity did not occur: Safety/medical concerns  Assist level: Minimal Assistance - Patient > 75% Assistive device: Walker-rolling Max distance: 164f   Walk 10 feet activity   Assist  Walk 10 feet activity did not occur: Safety/medical concerns  Assist level: Minimal Assistance - Patient > 75% Assistive device: Walker-rolling   Walk 50 feet activity   Assist Walk 50 feet with 2 turns activity did not occur: Safety/medical concerns  Assist level: Minimal Assistance - Patient > 75% Assistive device: Walker-rolling    Walk 150 feet activity   Assist Walk 150 feet activity did not occur: Safety/medical concerns         Walk 10 feet on uneven surface  activity   Assist Walk 10 feet on uneven surfaces activity did not occur: Safety/medical  concerns         Wheelchair     Assist Is the patient using a wheelchair?: Yes Type of Wheelchair: Manual Wheelchair activity did not occur: Refused         Wheelchair 50 feet with 2 turns activity    Assist    Wheelchair 50 feet with 2 turns activity did not occur: Refused       Wheelchair 150 feet activity     Assist  Wheelchair 150 feet activity did not occur: Refused       Blood pressure 134/62, pulse 67, temperature 98 F (36.7 C), resp. rate 18, height 6' (1.829 m), weight 67.9 kg, SpO2 96 %.  Medical Problem List and Plan: 1.  R hip fx s/p hemiarthroplasty secondary to multiple falls             -patient may  shower if covered       -ELOS/Goals: 16-18 days- min Assist -Continue CIR therapies including PT, OT, and SLP  2.  Antithrombotics: -DVT/anticoagulation:  Pharmaceutical: Continue to hold Lovenox given recent drop in hgb, transfusion  -TEDS, SCD's             -antiplatelet therapy: On ASA bid for DVT prophylaxis?-->decrease to once a day as on lovenox.  3. Pain: Discontinue Norco, continue Tylenol PRN 4. Mood: LCSW to follow for evaluation and  support.              -antipsychotic agents: N/A 5. Neuropsych: This patient is not fully capable of making decisions on his own behalf. 6. Skin/Wound Care: Monitor wound for healing.  7. Fluids/Electrolytes/Nutrition: Monitor I/O.    -encourage fluids 8. Cough: Continue Mucinex to 2 tabs BID, abx treatment  9. GERD: Continue Protonix BID.  10. Parkinson's disease: Managed with Sinemet IR TID 11. CAD s/p CABG: Monitor for symptoms with increase in activity. Continue ASA.              --hold lasix due to intermittent low BP.  12. Acute on chronic anemia due to blood loss anemia: increased nicely to 9.0 after 2U transfusion last week             --resumed iron supplement.  13. Constipation: continue Senna S, having a BM every three days but does not want additional intervention at this time.  14.  Hypotension: medications reviewed, not on any medications for hypertension.  Vitals:   11/30/20 1937 12/01/20 0343  BP: (!) 115/51 134/62  Pulse: 70 67  Resp: 16 18  Temp: 98.2 F (36.8 C) 98 F (36.7 C)  SpO2: 100% 96%    15. Hypokalemia: discontinue supplement,  repeat level on Monday 16. Aspiration pneumonia: WBC normalized, transitioned to oral abx, stop date entered 76. Insomnia: replaced trazodone with melatonin given hallucinations with former. Seems to be working 18. Mild to mod oropharyngeal dysphagia per MBS 9/9, likely esophageal component d/t HH. On regular diet -meds with puree -slow rate, small sips and bites -continue inpatient SLP.  Min S with eating, goals are modI 19. AKI: placed nursing order to encourage 6-8 glasses of water per day, resolved. Continue to monitor creatinine weekly.  20. Disposition: d/c 9/22 (21 days). Trying to contact sons [name] and Jeani Hawking to update.     LOS: 14 days A FACE TO FACE EVALUATION WAS PERFORMED  Jon Reid 12/01/2020, 10:48 AM

## 2020-12-01 NOTE — Progress Notes (Signed)
Nutrition Follow-up  DOCUMENTATION CODES:   Severe malnutrition in context of chronic illness  INTERVENTION:  Provide Magic cup TID with meals, each supplement provides 290 kcal and 9 grams of protein  Encourage adequate PO intake.   NUTRITION DIAGNOSIS:   Severe Malnutrition related to chronic illness as evidenced by severe fat depletion, severe muscle depletion; ongoing  GOAL:   Patient will meet greater than or equal to 90% of their needs; progressing  MONITOR:   PO intake, Supplement acceptance, Skin, Weight trends, Labs, I & O's  REASON FOR ASSESSMENT:   Malnutrition Screening Tool    ASSESSMENT:   87  year old male with history of prostate CA, CKD, HTN, CAD, Parkinson's disease, spinal stenosis who was admitted on 11/11/20 after a fall onto right hip with subsequent right subcapital hip fracture. Therapy is ongoing and patient continues to be limited by confusion with mumbling speech at times, shuffling gait with intermittent freezing episodes, posterior lean, inability to maintain hip precautions as well as poor awareness of deficits.  CIR was recommended due to functional decline.  Meal completion has been 50-100%. Pt has been tolerating his po diet. Ensure order has been discontinued by MD. RD to order Magic cup at meals to aid in caloric and protein needs. Pt encouraged to eat his food at meals and to drink his supplements.   Labs and medications reviewed.   Diet Order:   Diet Order             Diet Heart Room service appropriate? Yes; Fluid consistency: Thin  Diet effective now                   EDUCATION NEEDS:   Not appropriate for education at this time  Skin:  Skin Assessment: Skin Integrity Issues: Skin Integrity Issues:: Incisions Stage II: N/A Incisions: R hip  Last BM:  9/12  Height:   Ht Readings from Last 1 Encounters:  11/18/20 6' (1.829 m)    Weight:   Wt Readings from Last 1 Encounters:  11/25/20 67.9 kg   BMI:  Body mass  index is 20.3 kg/m.  Estimated Nutritional Needs:   Kcal:  1700-1900  Protein:  80-90 grams  Fluid:  >/= 1.7 L/day  Corrin Parker, MS, RD, LDN RD pager number/after hours weekend pager number on Amion.

## 2020-12-01 NOTE — Progress Notes (Signed)
Speech Language Pathology Daily Session Note  Patient Details  Name: Jon Reid MRN: [id number] Date of Birth: November 18, 1928  Today's Date: 12/01/2020 SLP Individual Time: 1030-1100 SLP Individual Time Calculation (min): 30 min  Short Term Goals: Week 1: SLP Short Term Goal 1 (Week 1): Patient will participate in objective evaluation of swallow function to determine if any oropharyngeal dysphagia. SLP Short Term Goal 2 (Week 1): SLP to determine if further dysphagia tx warranted pending MBS> SLP Short Term Goal 2 - Progress (Week 1): Met SLP Short Term Goal 3 (Week 1): Patient will tolerate current PO diet with mod I for use of swallow strategies.  Skilled Therapeutic Interventions:   Patient seen for skilled ST session focusing on dysphagia goals. Patient sitting up in [facility] at table in room. He reported "I ate too much" and that he feels he may be getting a little too full. He also reported continued throat clearing throughout the day with expectoration at times of "yellow" mucous. He continues to deny any significant change in his swallow function now as compared to prior to hospitalization. He demonstrated recall and understanding of swallow strategies for taking small bites/sips, sitting upright during and after PO intake. SLP reviewed importance of drinking more water to help thin mucous. He did report that he woke up several times having to urinate throughout the night. Patient left sitting in [facility] with all needs within reach and safety belt alarm in place. He continues to benefit from skilled SLP intervention to maximize swallow function and safety prior to discharge.  Pain Pain Assessment Pain Scale: 0-10 Pain Score: 0-No pain  Therapy/Group: Individual Therapy  Sonia Baller, MA, CCC-SLP Speech Therapy

## 2020-12-01 NOTE — Progress Notes (Signed)
Occupational Therapy Weekly Progress Note  Patient Details  Name: Jon Reid MRN: [id number] Date of Birth: Oct 04, 1928  Beginning of progress report period: November 18, 2020 End of progress report period: December 01, 2020  Today's Date: 12/01/2020 OT Individual Time: 3818-2993 OT Individual Time Calculation (min): 40 min    Patient has met 4 of 4 short term goals.  Pt is making steady gains and is now only CGA to min assist for functional mobility and self care.  Pt had right knee pain during todays session that did limit his ability to weight bear through RLE, and nursing notified. Pt would benefit from continued OT treatment to ensure maximal progress achieved to meet long term goals of supervision.  Patient continues to demonstrate the following deficits: muscle weakness, decreased cardiorespiratoy endurance, decreased coordination, and decreased sitting balance, decreased standing balance, and decreased balance strategies and therefore will continue to benefit from skilled OT intervention to enhance overall performance with BADL.  Patient progressing toward long term goals..  Continue plan of care.  OT Short Term Goals Week 1:  OT Short Term Goal 1 (Week 1): Pt will sit to stand from EOB to RW with min A. OT Short Term Goal 1 - Progress (Week 1): Met OT Short Term Goal 2 (Week 1): Pt will be able to stand at [facility] with S while therapist A him with managing clothing over hips. OT Short Term Goal 2 - Progress (Week 1): Met OT Short Term Goal 3 (Week 1): Pt will be able to don pants over feet with reacher with min A. OT Short Term Goal 3 - Progress (Week 1): Met OT Short Term Goal 4 (Week 1): Pt will be able to self cleanse post toileting with mod A. OT Short Term Goal 4 - Progress (Week 1): Met Week 2:  OT Short Term Goal 1 (Week 2): STGs=LTGs due to ELOS  Skilled Therapeutic Interventions/Progress Updates:    First session: Pt sitting up in w/c requesting to use toilet.  No c/o  pain.  Sit to stand at [employer] with CGA.  Ambulated appoximately 15 feet to toilet using RW with Vcs needed to keep device in front of him due to prematurely placing to side.  Pt urinated in standing with close supervision.  Pt ambulated to sink with CGA using RW and washed hands in standing.  Pt returned to w/c with min assist stand to sit.  Call bell in reach, seat alarm on at end of session.  Second session:  Pt sitting up in w/c with direct hand off from nursing.  Discussed at length pt's home setup and transported pt to ADL suite to practice shower threshold transfers due to pt reports he has a tub shower and a walk in shower with 3 inch threshold on his first floor at home.  Pt stood with min assist, however when attempting to step with RLE, pt unable to tolerate bearing weight through extremity and complaining of sharp pain anterior knee.  Pt attempted a few times, but repeated pain felt.  Pt transported back to room and therapist provided cold pack to right knee. Nurse notified of pts pain level and location.  Call bell in reach, seat alarm on at end of session.  Therapy Documentation Precautions:  Precautions Precautions: Posterior Hip, Fall Precaution Comments: verbally reviewed Required Braces or Orthoses: Knee Immobilizer - Right Knee Immobilizer - Right:  (in bed only, off with therapies) Restrictions Weight Bearing Restrictions: Yes RLE Weight Bearing: Weight bearing as  tolerated Other Position/Activity Restrictions: posterior hip precautions R LE    Therapy/Group: Individual Therapy  [name] 12/01/2020, 3:12 PM

## 2020-12-02 NOTE — Progress Notes (Signed)
Physical Therapy Weekly Progress Note  Patient Details  Name: Jon Reid MRN: [id number] Date of Birth: May 07, 1928  Beginning of progress report period: November 25, 2020 End of progress report period: December 02, 2020  Today's Date: 12/02/2020 PT Individual Time: 2703-5009 + 1115-1200 PT Individual Time Calculation (min): 60 min  + 45 min  Patient has met 2 of 3 short term goals. Pt making appropriate progress towards goals. He subjectively reports feeling stronger than he has since his hospitalization. He requires modA for sit>supine for BLE management, minA for supine<>sit for RLE management, min to modA for sit<>stand transfers (with arm rests, can complete sit<>stand with minA. Without arm rests, requires modA). He is ambulating >131f with minA and RW.   Patient continues to demonstrate the following deficits muscle weakness and muscle joint tightness, decreased cardiorespiratoy endurance, decreased problem solving, decreased safety awareness, and decreased memory, and decreased standing balance and decreased balance strategies and therefore will continue to benefit from skilled PT intervention to increase functional independence with mobility.  Patient progressing toward long term goals..  Continue plan of care.  PT Short Term Goals Week 2:  PT Short Term Goal 1 (Week 2): Pt will complete bed mobility at minA level PT Short Term Goal 1 - Progress (Week 2): Progressing toward goal PT Short Term Goal 2 (Week 2): Pt will complete bed<>chair transfers with minA and LRAD PT Short Term Goal 2 - Progress (Week 2): Met PT Short Term Goal 3 (Week 2): Pt will ambulate 544fwith minA and LRAD PT Short Term Goal 3 - Progress (Week 2): Met Week 3:  PT Short Term Goal 1 (Week 3): STG = LTG d/t ELOS  Skilled Therapeutic Interventions/Progress Updates:      1st session: Pt supine in bed at start of session. Agreeable to PT tx and reports no pain today. Donned knee-high TED's with totalA.  Pt reporting need to void - provided urinal for urgency and pt was continent of bladder - noted dirty brief and pt requesting to change. Donned boxer brief's with modA for threading LE's and for pulling them over hips. He struggles with getting them all the way over hips via bridge technique and needed to roll in bed to adequately pull up. Rolls in bed with supervision and use of bed rails. Donned shorts in similar manner. Supine<>sit with supervision (!) with HOB flat and no use of bed rails. Able to scoot himself forward at EOB with supervision as well. Donned slip-on shoes with totalA for time. Sit<>stand to RW with minA from elevated bed height and stand<>pivot transfer with minA for balance and RW to w/c. Pt then transported to main rehab gym for time. Completed curb transfers (5inch curb) with minA and RW - x4 total including steping up and down on the curb - requiring minA for balance and mod cues for stepping sequencing (up with L foot, down with R foot). Rest breaks needed b/w bouts due to fatigue. Then completed functional gait training, ~15540fith CGA/minA and RW - cues needed for longer strides on L and avoiding shuffling steps, also to keep his body within walker frame. No knee buckling or LOB but unsteadiness present and soft knees in R>L. Completed seated there-ex including LAQ, hip marches, and ankle pumps 2x20 reps each. Pt returned to his room at completion of session in w/c, safety belt alarm on, all personal items in reach.   2nd session: Pt greeted seated in w/c at start of session, agreeable to therapy.  No reports of pain - eager to "get up and move." W/c transport to day room rehab gym for time. Ambulatory transfer with minA and RW to Nustep with mod cues needed for hand placement during transfers. SetupA needed on Nustep - completed 12 minutes with workload set to 5, using x4 extremities, focusing on AAROM for R hip/knee and elongating strides to increase knee/hip extension on R. Pt  assisted off of Nustep with minA and ambulatory transfer with minA and RW to w/c. Gait training over level ground ~157f with minA and RW - cues for increasing R heel strike and R stride length. X1 instance of soft knee buckling on R that was self corrected. Completed x3 trials of TUG standardized outcome measure: Trial 1) 48 seconds Trial 2) 43 seconds Trial 3) 43 seconds TUG score >13.5 seconds indicates increased falls risk  Pt required seated rest breaks b/w all therapeutic activities for fatigue and RLE weakness.  Pt returned to his room at completion of session. Remained seated in w/c with safety belt alarm on and all needs in reach.  Therapy Documentation Precautions:  Precautions Precautions: Posterior Hip, Fall Precaution Comments: verbally reviewed Required Braces or Orthoses: Knee Immobilizer - Right Knee Immobilizer - Right:  (in bed only, off with therapies) Restrictions Weight Bearing Restrictions: Yes RLE Weight Bearing: Weight bearing as tolerated Other Position/Activity Restrictions: posterior hip precautions R LE General:    Therapy/Group: Individual Therapy  CAlger Simons9/16/2022, 7:51 AM

## 2020-12-02 NOTE — Progress Notes (Signed)
Occupational Therapy Session Note  Patient Details  Name: Jon Reid MRN: [id number] Date of Birth: 09-01-1928  Today's Date: 12/03/2020 OT Individual Time: 2060-1561 OT Individual Time Calculation (min): 54 min   Short Term Goals: Week 2:  OT Short Term Goal 1 (Week 2): STGs=LTGs due to ELOS  Skilled Therapeutic Interventions/Progress Updates:    Pt greeted in the recliner, ADL needs met. He wanted to work on his walking. After heavy Min A for sit<stand, pt used the RW with CGA to transfer to the w/c and was then escorted to an outdoor setting. Worked on dynamic standing balance and sit<stands while ambulating over uneven surfaces + up/down inclines using RW. Min visual cues for improving Lt foot clearance. Noted some Lt knee buckling at times but no overt LOBs. While seated, pt participated in LE exercises such as kicks, ankle pumps, and marches. We terminated flutter kicks due to crepitus in the Lt knee. Min-Mod A for sit<stands with vcs. Pt was then returned via w/c to the room. He transferred to the recliner and remained in company of family. All needs within reach.   Therapy Documentation Precautions:  Precautions Precautions: Posterior Hip, Fall Precaution Comments: verbally reviewed Required Braces or Orthoses: Knee Immobilizer - Right Knee Immobilizer - Right:  (in bed only, off with therapies) Restrictions Weight Bearing Restrictions: Yes RLE Weight Bearing: Weight bearing as tolerated Other Position/Activity Restrictions: posterior hip precautions R LE  Pain: in Lt knee, pt declined having an ice pack or inquiring RN if he was due for pain medicine. He was agreeable to have his LEs elevated as a positioning strategy at end of tx   ADL: ADL Eating: Set up Grooming: Setup Upper Body Bathing: Supervision/safety Where Assessed-Upper Body Bathing: Edge of bed Lower Body Bathing: Maximal assistance Where Assessed-Lower Body Bathing: Edge of bed Upper Body Dressing:  Supervision/safety Where Assessed-Upper Body Dressing: Edge of bed Lower Body Dressing: Dependent Where Assessed-Lower Body Dressing: Edge of bed Toileting: Dependent Where Assessed-Toileting: Glass blower/designer: Dependent Armed forces technical officer Method: Other (comment) (stedy) Toilet Transfer Equipment: Raised toilet seat     Therapy/Group: Individual Therapy  Mikhayla Phillis A Henok Heacock 12/03/2020, 4:13 PM

## 2020-12-02 NOTE — Progress Notes (Signed)
Occupational Therapy Session Note  Patient Details  Name: Jon Reid MRN: [id number] Date of Birth: [birth year]  Today's Date: 12/02/2020 OT Individual Time: 1400-1500 OT Individual Time Calculation (min): 60 min    Short Term Goals: Week 2:  OT Short Term Goal 1 (Week 2): STGs=LTGs due to ELOS  Skilled Therapeutic Interventions/Progress Updates:    Pt sitting up in w/c, reports right knee soreness much better today.  Pt transported to ADL kitchen and participated in static and dynamic standing and endurance training while completing IADL of washing and drying dishes then placing in various cabinets.  Pt required close supervision with multimodal cues for correct placement of RW when approaching counter tops and moving laterally to reach different cabinets.  Pt then able to ambulate 10 feet to pantry and retrieve box of spaghetti and attempted initially to carry while also grasping handle of RW.  Educated pt on use of walker bag to place items in while ambulating with bilateral grasp of RW.  Pt also needing education on safe body mechanics and RW placement when reaching for pot from lower cabinets due to pt attempting to abandon and twist to reach with unsteady balance.  Fair carryover after visual demo provided.  Will need more training and practice to facilitate improved carryover to technique.    Pt instructed on shower threshold transfer technique with lateral step due to pt has shower door.  Pt required mod assist to complete sequence using RW and grab bars.  Pt was able to exit shower threshold with forward step using RW for balance with CGA.  Pt returned to w/c with close supervision and transported back to room.  Call bell in reach, seat alarm on.  Therapy Documentation Precautions:  Precautions Precautions: Posterior Hip, Fall Precaution Comments: verbally reviewed Required Braces or Orthoses: Knee Immobilizer - Right Knee Immobilizer - Right:  (in bed only, off with  therapies) Restrictions Weight Bearing Restrictions: Yes RLE Weight Bearing: Weight bearing as tolerated Other Position/Activity Restrictions: posterior hip precautions R LE   Therapy/Group: Individual Therapy  [name] 12/02/2020, 4:15 PM

## 2020-12-02 NOTE — Progress Notes (Signed)
Speech Language Pathology Discharge Summary  Patient Details  Name: Jon Reid MRN: [id number] Date of Birth: Sep 06, 1928  Today's Date: 12/02/2020 SLP Individual Time: 0800-0830 SLP Individual Time Calculation (min): 30 min   Skilled Therapeutic Interventions:  Patient seen for skilled ST session with focus on dysphagia goals and review of swallow strategies. SLP observed patient with PO intake of portion of his breakfast meal. Voice remained clear overall and he exhibited intermittent throat clearing which has been his normal. He was able to recall swallow safety strategies and SLP did reiterate that most important of strategies is positioning (sitting up during and for 45 minutes after PO intake.) Education completed and SLP is discharging patient from [place] at this time.    Patient has met 3 of 3 long term goals.  Patient to discharge at overall Modified Independent level.  Reasons goals not met: N/A   Clinical Impression/Discharge Summary: Patient met all 3 LTG's for dysphagia and at time of discharge is tolerating regular solids, thin liquids with setup assistance but following basic level swallow precautions mod I. (sitting upright, effortful swallows, reflux precautions). At time of discharge, patient reporting that frequency and intensity of throat clearing and expectoration of phlegm is at premorbid levels. He has not exhibited immediate coughing or throat clearing and suspect that based on SLP observances during meals as well as MBS results, patient is exhibiting pharyngeal retention of solid and liquid PO's as well as secretions/mucous. During initial evaluation, patient had reported that he has been dealing with reflux symptoms with throat clearing, etc. for approximately past 30 years. SLP has recommended to patient to drink water more frequently during the day to keep oropharyngeal mucosa lubricated and to help thin down mucous/phlegm. Patient has demonstrated understanding of and  effective use of learned swallow safety strategies and so SLP is recommending discharge from [facility] services at this time. No further ST f/u recommended upon patient discharge home (planned for 9/22).  Care Partner:  Caregiver Able to Provide Assistance: Yes     Recommendation:  None      Equipment: None for speech   Reasons for discharge: Treatment goals met   Patient/Family Agrees with Progress Made and Goals Achieved: Yes  Sonia Baller, MA, CCC-SLP Speech Therapy

## 2020-12-02 NOTE — Progress Notes (Signed)
PROGRESS NOTE   Sleepy this morning. No complaints Tolerating therapy well.    ROS: Patient denies fever, rash, sore throat, blurred vision, nausea, vomiting, diarrhea, or chest pain,   headache, or mood change. +cough   Objective:   No results found. No results for input(s): WBC, HGB, HCT, PLT in the last 72 hours.  No results for input(s): NA, K, CL, CO2, GLUCOSE, BUN, CREATININE, CALCIUM in the last 72 hours.   Intake/Output Summary (Last 24 hours) at 12/02/2020 1042 Last data filed at 12/02/2020 0930 Gross per 24 hour  Intake 340 ml  Output 775 ml  Net -435 ml         Physical Exam: Vital Signs Blood pressure (!) 136/56, pulse 64, temperature 98.3 F (36.8 C), resp. rate 18, height 6' (1.829 m), weight 69.6 kg, SpO2 97 %. Gen: no distress, normal appearing HEENT: oral mucosa pink and moist, NCAT Cardio: Reg rate Chest: normal effort, normal rate of breathing Abd: soft, non-distended Ext: no edema Psych: pleasant, normal affect  Musculoskeletal:     Cervical back: Normal range of motion. No rigidity.     Comments: Right hip incision covered with steri-strips.  Moderate edema, no erythema seen.  UE 5-/5 B/L RLE- HF 3/5- in brace from mid thigh to R ankle- cannot test- knee; DF/PF 4-/5 LLE- 4-/5 Skin:    Comments: Bilateral forearms with multiple ecchymotic areas.   Peeling skin on top of head with actinic keratosis.   Sacral wound Neurological:     Mental Status: He is alert and oriented to person, place, and time.     Comments: Decreased hearing. Speech clear. Able to follow simple commands without difficulty.  Intact to light touch x 4     Assessment/Plan: 1. Functional deficits which require 3+ hours per day of interdisciplinary therapy in a comprehensive inpatient rehab setting. Physiatrist is providing close team supervision and 24 hour management of active medical problems listed  below. Physiatrist and rehab team continue to assess barriers to discharge/monitor patient progress toward functional and medical goals  Care Tool:  Bathing    Body parts bathed by patient: Right arm, Left arm, Chest, Abdomen, Front perineal area, Right upper leg, Left upper leg, Face   Body parts bathed by helper: Buttocks, Right lower leg, Left lower leg     Bathing assist Assist Level: Moderate Assistance - Patient 50 - 74%     Upper Body Dressing/Undressing Upper body dressing   What is the patient wearing?: Pull over shirt    Upper body assist Assist Level: Supervision/Verbal cueing    Lower Body Dressing/Undressing Lower body dressing      What is the patient wearing?: Pants     Lower body assist Assist for lower body dressing: Moderate Assistance - Patient 50 - 74%     Toileting Toileting    Toileting assist Assist for toileting: Total Assistance - Patient < 25% (urinal)     Transfers Chair/bed transfer  Transfers assist     Chair/bed transfer assist level: Minimal Assistance - Patient > 75% Chair/bed transfer assistive device: Programmer, multimedia   Ambulation assist   Ambulation activity did not occur: Safety/medical  concerns  Assist level: Minimal Assistance - Patient > 75% Assistive device: Walker-rolling Max distance: 170f   Walk 10 feet activity   Assist  Walk 10 feet activity did not occur: Safety/medical concerns  Assist level: Minimal Assistance - Patient > 75% Assistive device: Walker-rolling   Walk 50 feet activity   Assist Walk 50 feet with 2 turns activity did not occur: Safety/medical concerns  Assist level: Minimal Assistance - Patient > 75% Assistive device: Walker-rolling    Walk 150 feet activity   Assist Walk 150 feet activity did not occur: Safety/medical concerns         Walk 10 feet on uneven surface  activity   Assist Walk 10 feet on uneven surfaces activity did not occur: Safety/medical  concerns         Wheelchair     Assist Is the patient using a wheelchair?: Yes Type of Wheelchair: Manual Wheelchair activity did not occur: Refused         Wheelchair 50 feet with 2 turns activity    Assist    Wheelchair 50 feet with 2 turns activity did not occur: Refused       Wheelchair 150 feet activity     Assist  Wheelchair 150 feet activity did not occur: Refused       Blood pressure (!) 136/56, pulse 64, temperature 98.3 F (36.8 C), resp. rate 18, height 6' (1.829 m), weight 69.6 kg, SpO2 97 %.  Medical Problem List and Plan: 1.  R hip fx s/p hemiarthroplasty secondary to multiple falls             -patient may  shower if covered       -ELOS/Goals: 16-18 days- min Assist -Continue CIR therapies including PT, OT, and SLP  2.  Antithrombotics: -DVT/anticoagulation:  Pharmaceutical: Continue to hold Lovenox given recent drop in hgb, transfusion  -TEDS, SCD's             -antiplatelet therapy: On ASA bid for DVT prophylaxis?-->decrease to once a day as on lovenox.  3. Pain: Discontinue Norco, continue Tylenol PRN 4. Mood: LCSW to follow for evaluation and  support.              -antipsychotic agents: N/A 5. Neuropsych: This patient is not fully capable of making decisions on his own behalf. 6. Skin/Wound Care: Monitor wound for healing.  7. Fluids/Electrolytes/Nutrition: Monitor I/O.    -encourage fluids 8. Cough: continue Mucinex to 2 tabs BID, abx treatment  9. GERD: continue Protonix BID.  10. Parkinson's disease: Managed with Sinemet IR TID 11. CAD s/p CABG: Monitor for symptoms with increase in activity. Continue ASA.              --hold lasix due to intermittent low BP.  12. Acute on chronic anemia due to blood loss anemia: increased nicely to 9.0 after 2U transfusion last week             --resumed iron supplement.  13. Constipation: continue Senna S, having a BM every three days but does not want additional intervention at this time.   14. Hypotension: medications reviewed, not on any medications for hypertension.  Vitals:   12/01/20 1941 12/02/20 0442  BP: 121/60 (!) 136/56  Pulse: 75 64  Resp: 18 18  Temp: 98.8 F (37.1 C) 98.3 F (36.8 C)  SpO2: 100% 97%    15. Hypokalemia: discontinue supplement, repeat level on Monday 16. Aspiration pneumonia: WBC normalized, transitioned to oral abx, stop date entered  17. Insomnia: replaced trazodone with melatonin given hallucinations with former. Seems to be working 18. Mild to mod oropharyngeal dysphagia per MBS 9/9, likely esophageal component d/t HH. On regular diet -meds with puree -slow rate, small sips and bites -continue inpatient SLP.  Min S with eating, goals are modI 19. AKI: placed nursing order to encourage 6-8 glasses of water per day, resolved. Continue to monitor creatinine weekly.  20. Disposition: d/c 9/22 (21 days). Trying to contact sons Jon Reid and Jon Reid to update.     LOS: 15 days A FACE TO FACE EVALUATION WAS PERFORMED  Jon Reid 12/02/2020, 10:42 AM

## 2020-12-03 NOTE — Progress Notes (Signed)
Patient c/o nausea and taste of dessert from earlier coming up. He also stated that when he is in a certain position he has trouble breathing. Administered prn compazine. An hour later patient c/o nausea and still coughing up taste of dessert. 02  96% no visible distress or sob noted. Maalox administered for indigestion, patient only able to swallow a small amount. No other concerns to report at this time.

## 2020-12-03 NOTE — Progress Notes (Signed)
Physical Therapy Session Note  Patient Details  Name: Jon Reid MRN: [id number] Date of Birth: 05-29-1928  Today's Date: 12/03/2020 PT Individual Time: 0932-1030 PT Individual Time Calculation (min): 58 min   Short Term Goals: Week 1:  PT Short Term Goal 1 (Week 1): Pt will perform bed mobility with mod assist PT Short Term Goal 1 - Progress (Week 1): Progressing toward goal PT Short Term Goal 2 (Week 1): Pt will ambulate 7f with mod assist PT Short Term Goal 2 - Progress (Week 1): Met PT Short Term Goal 3 (Week 1): Pt will propell WC with supervision assist around room PT Short Term Goal 3 - Progress (Week 1): Not met PT Short Term Goal 4 (Week 1): Pt will consistently transfer with mod assist to and from Bed PT Short Term Goal 4 - Progress (Week 1): Met  Skilled Therapeutic Interventions/Progress Updates:  Patient supine in bed on entrance to room. Patient alert and agreeable to PT session.   Patient with no pain complaint throughout session but does complain of R knee soreness by end of session. Addressed with repositioning  Therapeutic Activity: Bed Mobility: Patient performed supine <> sit with supervision requiring extra time to complete bringing LE to EOB. VC/ tc required for technique. Transfers: Patient performed sit<>stand and stand pivot transfers throughout session with Min A improving to CGA. Provided vc/ tc for improved forward lean and push from armrests to assist with forward lean. Demos good reach back to armrest with all descent to sit.   Gait Training:  Patient ambulated 200' x1/ 776 x1/ 45' x1 using RW with CGA/ close supervision. Demonstrated slow pace with noted increase in soreness to R knee. Provided vc/ tc for increasing R hip/knee flexion, upright posture. Seated therapeutic rest breaks provided prn.  Therapeutic Exercise/ NMR: Patient performed seated and then standing toe touches to a 6" step using BHR to steady. Performed 2x10 for each BLE with vc  for straight anterior lift of knee for foot to reach step. Performs RLE with good control and LLE with guard to R knee. Initially hesitant to perform with LLE but is able to complete with good control of RLE, especially with visual of therapist's LE placed just anterior of pt's LE for block/ guard.  Patient seated  in recliner with BLE elevated at end of session with brakes locked, belt alarm set, and all needs within reach on tray table set in front of him and on bed to his L.      Therapy Documentation Precautions:  Precautions Precautions: Posterior Hip, Fall Precaution Comments: verbally reviewed Required Braces or Orthoses: Knee Immobilizer - Right Knee Immobilizer - Right:  (in bed only, off with therapies) Restrictions Weight Bearing Restrictions: Yes RLE Weight Bearing: Weight bearing as tolerated Other Position/Activity Restrictions: posterior hip precautions R LE General:   Vital Signs: Therapy Vitals Temp: 97.8 F (36.6 C) Temp Source: Oral Pulse Rate: 72 Resp: 16 BP: (!) 115/55 Patient Position (if appropriate): Lying Oxygen Therapy SpO2: 96 % O2 Device: Room Air Pain:  Pt with no pain complaint during session but does report mild soreness to R knee near end of session. Addressed with change in position and elevated LE.   Therapy/Group: Individual Therapy  JAlger SimonsPT, DPT 12/03/2020, 7:46 AM

## 2020-12-04 ENCOUNTER — Inpatient Hospital Stay (HOSPITAL_COMMUNITY): Payer: MEDICARE

## 2020-12-04 DIAGNOSIS — Z96649 Presence of unspecified artificial hip joint: Secondary | ICD-10-CM

## 2020-12-04 DIAGNOSIS — D649 Anemia, unspecified: Secondary | ICD-10-CM

## 2020-12-04 DIAGNOSIS — N179 Acute kidney failure, unspecified: Secondary | ICD-10-CM

## 2020-12-04 DIAGNOSIS — E876 Hypokalemia: Secondary | ICD-10-CM

## 2020-12-04 DIAGNOSIS — R0989 Other specified symptoms and signs involving the circulatory and respiratory systems: Secondary | ICD-10-CM

## 2020-12-04 NOTE — Progress Notes (Signed)
    Subjective: Has been in CIR since 11/17/20. In wheelchair staring out window of his room. Patient reports pain as mild. Mainly only hurts when moving the RLE. Tolerating diet. Urinating. No CP, SOB. Working with PT/OT on mobilization OOB. Eager to go home again  Objective:   VITALS:   Vitals:   12/03/20 0432 12/03/20 1615 12/03/20 1919 12/04/20 0504  BP: (!) 115/55 (!) 125/52 (!) 143/59 (!) 111/56  Pulse: 72 69 72 67  Resp: '16 18 18 18  '$ Temp: 97.8 F (36.6 C) 98.7 F (37.1 C) 98.6 F (37 C) 98.5 F (36.9 C)  TempSrc: Oral Oral Oral   SpO2: 96% 98% 100% 93%  Weight:      Height:       CBC Latest Ref Rng & Units 11/29/2020 11/28/2020 11/26/2020  WBC 4.0 - 10.5 K/uL 9.5 11.5(H) 7.6  Hemoglobin 13.0 - 17.0 g/dL 9.0(L) 8.5(L) 8.1(L)  Hematocrit 39.0 - 52.0 % 28.4(L) 26.1(L) 24.6(L)  Platelets 150 - 400 K/uL 497(H) 465(H) 389   BMP Latest Ref Rng & Units 11/29/2020 11/28/2020 11/27/2020  Glucose 70 - 99 mg/dL 107(H) 120(H) 108(H)  BUN 8 - 23 mg/dL '14 15 17  '$ Creatinine 0.61 - 1.24 mg/dL 1.21 1.30(H) 1.26(H)  BUN/Creat Ratio 10 - 24 - - -  Sodium 135 - 145 mmol/L 135 136 137  Potassium 3.5 - 5.1 mmol/L 4.2 4.2 3.8  Chloride 98 - 111 mmol/L 104 109 109  CO2 22 - 32 mmol/L 23 21(L) 21(L)  Calcium 8.9 - 10.3 mg/dL 8.2(L) 8.1(L) 8.2(L)   Intake/Output      09/17 0701 09/18 0700 09/18 0701 09/19 0700   P.O. 610 318   Total Intake(mL/kg) 610 (8.8) 318 (4.6)   Urine (mL/kg/hr) 1200 (0.7)    Stool 0    Total Output 1200    Net -590 +318        Urine Occurrence 2 x    Stool Occurrence 1 x       Physical Exam: General: NAD.  Sitting in wheelchair looking out window. Calm, conversant Resp: No increased wob Cardio: regular rate and rhythm ABD soft Neurologically intact MSK Neurovascularly intact Sensation intact distally Intact pulses distally Dorsiflexion/Plantar flexion intact Incision: c/d/i   Assessment:    S/P RIGHT HIP HEMIARTHROPLASTY by Dr. Ernesta Amble. Percell Miller  on 11/13/20  Principal Problem:   Femur fracture, right ([place]) Active Problems:   S/P hip hemiarthroplasty   Pressure injury of skin   Closed fracture of right hip requiring operative repair, sequela   Protein-calorie malnutrition, severe   AKI (acute kidney injury) ([place])   Hypokalemia   Labile blood pressure   Acute on chronic anemia   Plan: Doing well in CIR Will order port right hip xray to check on hip hemi implants  Up with therapy Incentive Spirometry Elevate and Apply ice PRN  Weightbearing: WBAT RLE Insicional and dressing care: Once steri strips come off it is ok to not recover the incision  Orthopedic device(s): KI to help with maintaining posterior hip precautions to be worn when in bed or chair, can remove for PT/OT sessions Showering: ok VTE prophylaxis: Lovenox '40mg'$  qd while inpatient, can switch to ASA '81mg'$  bid x 30 days upon d/c, SCDs, ambulation Pain control: Continue current regimen Follow - up plan: 4 weeks from now in the office Contact information:  Edmonia Lynch MD, [facility] PA-C   Britt Bottom, [place] Office [phone] 12/04/2020, 11:56 AM

## 2020-12-04 NOTE — Progress Notes (Signed)
PROGRESS NOTE     Subjective: Patient seen sitting up in bed this morning breakfast.  He states he slept well overnight.  He denies complaints.  He is in good spirits.  ROS: Denies CP, SOB, N/V/D  Objective:   No results found. No results for input(s): WBC, HGB, HCT, PLT in the last 72 hours.  No results for input(s): NA, K, CL, CO2, GLUCOSE, BUN, CREATININE, CALCIUM in the last 72 hours.   Intake/Output Summary (Last 24 hours) at 12/04/2020 1005 Last data filed at 12/04/2020 0802 Gross per 24 hour  Intake 568 ml  Output 1050 ml  Net -482 ml          Physical Exam: Vital Signs Blood pressure (!) 111/56, pulse 67, temperature 98.5 F (36.9 C), resp. rate 18, height 6' (1.829 m), weight 69.6 kg, SpO2 93 %. Constitutional: No distress . Vital signs reviewed. HENT: Normocephalic.  Atraumatic. Eyes: EOMI. No discharge. Cardiovascular: No JVD.  RRR. Respiratory: Normal effort.  No stridor.  Bilateral clear to auscultation. GI: Non-distended.  BS +. Skin: Warm and dry.  Intact. Psych: Normal mood.  Normal behavior. Musc: Right hip incision with some edema and tenderness Neuro: Alert HOH Motor: Bilateral upper extremities: 5/5 proximal distal Right lower extremity: Hip flexion 2+/5, ankle dorsiflexion 4/5  LLE: Hip flexion, knee extension 4-/5, ankle dorsiflexion 4/5  Assessment/Plan: 1. Functional deficits which require 3+ hours per day of interdisciplinary therapy in a comprehensive inpatient rehab setting. Physiatrist is providing close team supervision and 24 hour management of active medical problems listed below. Physiatrist and rehab team continue to assess barriers to discharge/monitor patient progress toward functional and medical goals  Care Tool:  Bathing    Body parts bathed by patient: Right arm, Left arm, Chest, Abdomen, Front perineal area, Right upper leg, Left upper leg, Face   Body parts bathed  by helper: Buttocks, Right lower leg, Left lower leg     Bathing assist Assist Level: Moderate Assistance - Patient 50 - 74%     Upper Body Dressing/Undressing Upper body dressing   What is the patient wearing?: Pull over shirt    Upper body assist Assist Level: Supervision/Verbal cueing    Lower Body Dressing/Undressing Lower body dressing      What is the patient wearing?: Pants     Lower body assist Assist for lower body dressing: Moderate Assistance - Patient 50 - 74%     Toileting Toileting    Toileting assist Assist for toileting: Total Assistance - Patient < 25% (urinal)     Transfers Chair/bed transfer  Transfers assist     Chair/bed transfer assist level: Minimal Assistance - Patient > 75% Chair/bed transfer assistive device: Programmer, multimedia   Ambulation assist   Ambulation activity did not occur: Safety/medical concerns  Assist level: Minimal Assistance - Patient > 75% Assistive device: Walker-rolling Max distance: 1103f   Walk 10 feet activity   Assist  Walk 10 feet activity did not occur: Safety/medical concerns  Assist level: Minimal Assistance - Patient > 75% Assistive device: Walker-rolling   Walk 50 feet activity   Assist Walk 50 feet with 2 turns activity did not occur:  Safety/medical concerns  Assist level: Minimal Assistance - Patient > 75% Assistive device: Walker-rolling    Walk 150 feet activity   Assist Walk 150 feet activity did not occur: Safety/medical concerns  Assist level: Minimal Assistance - Patient > 75% Assistive device: Walker-rolling    Walk 10 feet on uneven surface  activity   Assist Walk 10 feet on uneven surfaces activity did not occur: Safety/medical concerns         Wheelchair     Assist Is the patient using a wheelchair?: No Type of Wheelchair: Manual Wheelchair activity did not occur: N/A         Wheelchair 50 feet with 2 turns activity    Assist     Wheelchair 50 feet with 2 turns activity did not occur: N/A       Wheelchair 150 feet activity     Assist  Wheelchair 150 feet activity did not occur: N/A       Blood pressure (!) 111/56, pulse 67, temperature 98.5 F (36.9 C), resp. rate 18, height 6' (1.829 m), weight 69.6 kg, SpO2 93 %.  Medical Problem List and Plan: 1.  R hip fx s/p hemiarthroplasty secondary to multiple falls             Continue CIR 2.  Antithrombotics: -DVT/anticoagulation:  Pharmaceutical: Continue to hold Lovenox given recent drop in hgb, transfusion  -TEDS, SCD's             -antiplatelet therapy: On ASA bid for DVT prophylaxis?-->decrease to once a day as on lovenox.  3. Pain: Discontinue Norco, continue Tylenol PRN 4. Mood: LCSW to follow for evaluation and  support.              -antipsychotic agents: N/A 5. Neuropsych: This patient is not fully capable of making decisions on his own behalf. 6. Skin/Wound Care: Monitor wound for healing.  7. Fluids/Electrolytes/Nutrition: Monitor I/O.    -encourage fluids 8. Cough: continue Mucinex to 2 tabs BID, abx treatment  9. GERD: continue Protonix BID.  10. Parkinson's disease: Managed with Sinemet IR TID 11. CAD s/p CABG: Monitor for symptoms with increase in activity. Continue ASA.              --hold lasix due to intermittent low BP.  12. Acute on chronic anemia due to blood loss anemia: increased nicely to 9.0 after 2U transfusion              --resumed iron supplement.   Hemoglobin 9.0 on 9/13  Continue to monitor 13. Constipation: continue Senna S, having a BM every three days but does not want additional intervention at this time.  14. Hypotension: medications reviewed, not on any medications for hypertension.  Vitals:   12/03/20 1919 12/04/20 0504  BP: (!) 143/59 (!) 111/56  Pulse: 72 67  Resp: 18 18  Temp: 98.6 F (37 C) 98.5 F (36.9 C)  SpO2: 100% 93%   Slightly labile, but relatively controlled on 9/18 15. Hypokalemia:  discontinued supplement  Labs ordered for tomorrow 16. Aspiration pneumonia: WBC normalized, transitioned to oral abx, stop date entered 77.  Sleep disturbance: replaced trazodone with melatonin given hallucinations with former. Seems to be working 18. Mild to mod oropharyngeal dysphagia per MBS 9/9, likely esophageal component d/t HH. On regular diet -meds with puree -slow rate, small sips and bites -continue inpatient SLP.  19. AKI: nursing order to encourage 6-8 glasses of water per day, resolved. Continue to monitor creatinine weekly.   Creatinine  1.21 on 9/13, labs ordered for tomorrow  LOS: 17 days A FACE TO FACE EVALUATION WAS PERFORMED  Jon Reid 12/04/2020, 10:05 AM

## 2020-12-05 LAB — BASIC METABOLIC PANEL
Anion gap: 8 (ref 5–15)
BUN: 15 mg/dL (ref 8–23)
CO2: 22 mmol/L (ref 22–32)
Calcium: 8.3 mg/dL — ABNORMAL LOW (ref 8.9–10.3)
Chloride: 103 mmol/L (ref 98–111)
Creatinine, Ser: 1.16 mg/dL (ref 0.61–1.24)
GFR, Estimated: 60 mL/min — ABNORMAL LOW (ref >60)
Glucose, Bld: 97 mg/dL (ref 70–99)
Potassium: 4.1 mmol/L (ref 3.5–5.1)
Sodium: 133 mmol/L — ABNORMAL LOW (ref 135–145)

## 2020-12-05 NOTE — Progress Notes (Signed)
Occupational Therapy Session Note  Patient Details  Name: Jon Reid MRN: [id number] Date of Birth: [birth year]  Today's Date: 12/05/2020 OT Individual Time: [id number] OT Individual Time Calculation (min): 60 min    Short Term Goals: Week 2:  OT Short Term Goal 1 (Week 2): STGs=LTGs due to ELOS  Skilled Therapeutic Interventions/Progress Updates:  Pt greeted supine in bed  agreeable to OT intervention. Session focus on BADL reeducation. Pt completed bed mobility with CGA with pt exiting to the R side of bed. Pt completed sit<>stand from elevated EOB with MIN A, MIN A for SPT from EOB>w/c with Rw. Pt transported to sink for bathing, pt requesting to only wash UB needing set- up assist only. Set- up for UB Dressing, and MIN A for LB Dressing to don pants from w/c with reacher, MIN A needed for balance during sit<>stand while pt pulled pants up to waist line. Increased difficulty noted when managing button on pants, recommend pants with drawstring to maximize functional independence. Pt completed seated grooming tasks at sink with supervision. Pt completed ambulatory toilet transfer with Rw and MIN A. Pt required MOD A for toileting tasks needing most assist for clothing mgmt. Pt completed ambulatory toilet transfer out of BR to w/c with Rw and MIN A. Pt left seated in w/c with safety belt activated and all needs wihtin reach.                         Therapy Documentation Precautions:  Precautions Precautions: Posterior Hip, Fall Precaution Comments: verbally reviewed Required Braces or Orthoses: Knee Immobilizer - Right Knee Immobilizer - Right:  (in bed only, off with therapies) Restrictions Weight Bearing Restrictions: Yes RLE Weight Bearing: Weight bearing as tolerated Other Position/Activity Restrictions: posterior hip precautions R LE   Pain: Pt reports no pain during session    Therapy/Group: Individual Therapy  Corinne Ports [facility] 12/05/2020, 12:06 PM

## 2020-12-05 NOTE — Progress Notes (Signed)
PROGRESS NOTE    Pt up at sink with OT. No new issues this morning. Pain controlled. Denies any breathing problems  ROS: Patient denies fever, rash, sore throat, blurred vision, nausea, vomiting, diarrhea, cough, shortness of breath or chest pain,  back pain, headache, or mood change.   Objective:   DG HIP PORT UNILAT WITH PELVIS 1V RIGHT  Result Date: 12/04/2020 CLINICAL DATA:  Status post arthroplasty EXAM: DG HIP (WITH OR WITHOUT PELVIS) 1V PORT RIGHT COMPARISON:  November 11, 2020 FINDINGS: Patient is status post right hip replacement. The hardware is in good position. No other interval changes. IMPRESSION: Right hip arthroplasty placement as above. Hardware is in good position. Electronically Signed   By: Dorise Bullion III M.D.   On: 12/04/2020 15:57   No results for input(s): WBC, HGB, HCT, PLT in the last 72 hours.  Recent Labs    12/05/20 0613  NA 133*  K 4.1  CL 103  CO2 22  GLUCOSE 97  BUN 15  CREATININE 1.16  CALCIUM 8.3*     Intake/Output Summary (Last 24 hours) at 12/05/2020 1035 Last data filed at 12/05/2020 0940 Gross per 24 hour  Intake 450 ml  Output 1350 ml  Net -900 ml         Physical Exam: Vital Signs Blood pressure (!) 144/63, pulse 66, temperature 98.2 F (36.8 C), resp. rate 18, height 6' (1.829 m), weight 69.6 kg, SpO2 98 %. Constitutional: No distress . Vital signs reviewed. HEENT: NCAT, EOMI, oral membranes moist Neck: supple Cardiovascular: RRR without murmur. No JVD    Respiratory/Chest: CTA Bilaterally without wheezes or rales. Normal effort    GI/Abdomen: BS +, non-tender, non-distended Ext: no clubbing, cyanosis, or edema Psych: pleasant and cooperative  Musc: Right hip incision with some edema and tenderness Neuro: Alert HOH Motor: Bilateral upper extremities: 5/5 proximal distal Right lower extremity: Hip flexion 2+/5, ankle dorsiflexion 4/5  LLE: Hip flexion, knee  extension 4-/5, ankle dorsiflexion 4/5  Assessment/Plan: 1. Functional deficits which require 3+ hours per day of interdisciplinary therapy in a comprehensive inpatient rehab setting. Physiatrist is providing close team supervision and 24 hour management of active medical problems listed below. Physiatrist and rehab team continue to assess barriers to discharge/monitor patient progress toward functional and medical goals  Care Tool:  Bathing    Body parts bathed by patient: Right arm, Left arm, Chest, Abdomen, Front perineal area, Right upper leg, Left upper leg, Face   Body parts bathed by helper: Buttocks, Right lower leg, Left lower leg     Bathing assist Assist Level: Moderate Assistance - Patient 50 - 74%     Upper Body Dressing/Undressing Upper body dressing   What is the patient wearing?: Pull over shirt    Upper body assist Assist Level: Supervision/Verbal cueing    Lower Body Dressing/Undressing Lower body dressing      What is the patient wearing?: Pants     Lower body assist Assist for lower body dressing: Moderate Assistance - Patient 50 - 74%     Toileting Toileting    Toileting assist Assist for toileting: Total Assistance - Patient < 25% (urinal)  Transfers Chair/bed transfer  Transfers assist     Chair/bed transfer assist level: Minimal Assistance - Patient > 75% Chair/bed transfer assistive device: Programmer, multimedia   Ambulation assist   Ambulation activity did not occur: Safety/medical concerns  Assist level: Minimal Assistance - Patient > 75% Assistive device: Walker-rolling Max distance: 185f   Walk 10 feet activity   Assist  Walk 10 feet activity did not occur: Safety/medical concerns  Assist level: Minimal Assistance - Patient > 75% Assistive device: Walker-rolling   Walk 50 feet activity   Assist Walk 50 feet with 2 turns activity did not occur: Safety/medical concerns  Assist level: Minimal Assistance  - Patient > 75% Assistive device: Walker-rolling    Walk 150 feet activity   Assist Walk 150 feet activity did not occur: Safety/medical concerns  Assist level: Minimal Assistance - Patient > 75% Assistive device: Walker-rolling    Walk 10 feet on uneven surface  activity   Assist Walk 10 feet on uneven surfaces activity did not occur: Safety/medical concerns         Wheelchair     Assist Is the patient using a wheelchair?: No Type of Wheelchair: Manual Wheelchair activity did not occur: N/A         Wheelchair 50 feet with 2 turns activity    Assist    Wheelchair 50 feet with 2 turns activity did not occur: N/A       Wheelchair 150 feet activity     Assist  Wheelchair 150 feet activity did not occur: N/A       Blood pressure (!) 144/63, pulse 66, temperature 98.2 F (36.8 C), resp. rate 18, height 6' (1.829 m), weight 69.6 kg, SpO2 98 %.  Medical Problem List and Plan: 1.  R hip fx s/p hemiarthroplasty secondary to multiple falls             -Continue CIR therapies including PT, OT  2.  Antithrombotics: -DVT/anticoagulation:  Pharmaceutical: Continue to hold Lovenox given recent drop in hgb, transfusion  -TEDS, SCD's             -antiplatelet therapy: On ASA bid for DVT prophylaxis?-->decreased to once a day as on lovenox.  3. Pain: Discontinue Norco, continue Tylenol PRN 4. Mood: LCSW to follow for evaluation and  support.              -antipsychotic agents: N/A 5. Neuropsych: This patient is not fully capable of making decisions on his own behalf. 6. Skin/Wound Care: Monitor wound for healing.  7. Fluids/Electrolytes/Nutrition: Monitor I/O.    -encourage fluids 8. Cough: continue Mucinex to 2 tabs BID, abx treatment  9. GERD: continue Protonix BID.  10. Parkinson's disease: Managed with Sinemet IR TID 11. CAD s/p CABG: Monitor for symptoms with increase in activity. Continue ASA.              --hold lasix due to intermittent low BP.   12. Acute on chronic anemia due to blood loss anemia: increased nicely to 9.0 after 2U transfusion              --resumed iron supplement.   Hemoglobin 9.0 on 9/13  Continue to monitor 13. Constipation: continue Senna S, having a BM every three days but does not want additional intervention at this time. Moved bowels 9/18 14. Hypotension: medications reviewed, not on any medications for hypertension.  Vitals:   12/05/20 0343 12/05/20 0349  BP: (!) 144/64 (!) 144/63  Pulse: 70 66  Resp: 18   Temp: 98.2 F (36.8 C)   SpO2: 98%    Relatively controlled on 9/19 15. Hypokalemia: discontinued supplement  4.1 9/19 16. Aspiration pneumonia: WBC normalized, transitioned to oral abx, stop date entered 17.  Sleep disturbance: replaced trazodone with melatonin given hallucinations with former. Seems to be effective 18. Mild to mod oropharyngeal dysphagia per MBS 9/9, likely esophageal component d/t HH. On regular diet -meds with puree -slow rate, small sips and bites -continue per SLP.  19. AKI: nursing order to encourage 6-8 glasses of water per day, resolved. Continue to monitor creatinine weekly.   Creatinine 1.21 on 9/13-> 1.16 9/19  LOS: 18 days A FACE TO [place] 12/05/2020, 10:35 AM

## 2020-12-05 NOTE — Progress Notes (Signed)
Physical Therapy Session Note  Patient Details  Name: Jon Reid MRN: [id number] Date of Birth: Jan 26, 1929  Today's Date: 12/05/2020 PT Individual Time: 1100-1200 + 1445-1525 PT Individual Time Calculation (min): 60 min  + 40 min  Short Term Goals: Week 3:  PT Short Term Goal 1 (Week 3): STG = LTG d/t ELOS  Skilled Therapeutic Interventions/Progress Updates:     1st session: Pt sitting in w/c at start of session - he's agreeable to therapy and reports no pain at rest and mild/moderate pain of R knee with mobility. Rest breaks and repositioning provided for pain management as needed during session. Pt wheeled to main rehab gym for time. Sit<>stand to RW with CGA from w/c while pushing up with upper extremities from arm rests. Worked on gait training in hallways 2x159f with CGA (fading to minA with fatigue) and RW - cues needed for increasing R stride length and keeping body within walker frame - no formal LOB or knee buckling noted but "soft" knees with knee flexion in stance. Assisted onto the Nustep for RLE positioning - completed 10 minutes at workload 5 using x4 extremities - cues for facilitating longer stride to promote R knee/hip extension. Ambulatory transfer with RW and minA to [facility] for RLE management into/out of the car. Completed standing there-ex in // bars with CGA for balance and BUE support on bars. Completed 4 sets of 10 with seated rest breaks after 2nd set: -hip abduction on R with 2# ankle weight -knee flexion to 90 on R with 2# ankle weight -hamstring curls on R with 2# ankle weight -heel raises bilaterally  Pt returned to his room at completion of session - ended session seated in w/c with safety belt alarm on and all needs in reach.   2nd session: Pt supine in bed sleeping on arrival - awakens easily to voice and is agreeable to therapy tx. He reports some R knee pain - provided rest breaks and repositioning  for management. He reports he doesn't take any pain medications. Supine<>sit completed at supervision level with HOB slightly elevated. Sit<>stand to RW with CGA and requires minA for balance has he adjusts his pants. Ambulatory transfer to w/c with RW and CGA. Requires cues for safety approach and turning within his walker to avoid leaving it. Transported to day room rehab gym for time. Completed ambulatory transfer with CGA and RW to Kinetron. While seated on Kinetron, completed 2x4 minutes with workload at 20cm/sec resistance - emphasis on RLE strengthening. Completed ambulatory transfer with CGA and RW back to his w/c and then worked on seated there-ex with 4# dowel rod with chest press, bicep curl, shldr press, and rows. 2x10 reps each. Pt returned to his room at completion of session and remained in w/c with safety belt alarm on and his needs in reach.    Therapy Documentation Precautions:  Precautions Precautions: Posterior Hip, Fall Precaution Comments: verbally reviewed Required Braces or Orthoses: Knee Immobilizer - Right Knee Immobilizer - Right:  (in bed only, off with therapies) Restrictions Weight Bearing Restrictions: Yes RLE Weight Bearing: Weight bearing as tolerated Other Position/Activity Restrictions: posterior hip precautions R LE General:    Therapy/Group: Individual Therapy  CAlger Simons9/19/2022, 7:39 AM

## 2020-12-05 NOTE — Progress Notes (Signed)
Occupational Therapy Session Note  Patient Details  Name: Jon Reid MRN: [id number] Date of Birth: [birth year]  Today's Date: 12/05/2020 OT Individual Time: 1335-1435 OT Individual Time Calculation (min): 60 min    Short Term Goals: Week 2:  OT Short Term Goal 1 (Week 2): STGs=LTGs due to ELOS  Skilled Therapeutic Interventions/Progress Updates:    Pt sitting up in w/c, c/o mild soreness of right knee. Requesting to use toilet prior to going to gym.  Resting breaks provided as needed to address knee pain throughout session.  Pt required close supervision using RW with frequent Vcs for safe RW placement due to pt abandoning prematurely a few occasions and also turning self independently from RW (RW facing one direction, self facing another). Pt urinated in standing with assist to tie laces on pants.  Pt ambulated to sink and washed hands with close supervision for same reasons as previously mentioned.    Pt ambulated 150 feet with RW needing close supervision and one occasion of CGA due to knee buckling (pt reports secondary to knee pain).  Pt participated in static and dynamic standing task without BUE support needing close supervision to play checkers on vertical board to promote improved balance and also facilitate upright standing posture (pt stands in kyphotic position with downward head gaze typically).  Pt needing intermittent cues to follow rules of game due to pt switching color pieces and pushing pieces in wrong direction at times.  For the most part pt able to recall and follow rules of game and exhibit active problem solving to achieve goal of game. At one point pt dropped a piece on the floor.  OT provided question "what would you do if you dropped something on the floor at home" Pt replied that he would bend over and pick it up.  OT provided question cue of "what about since you have had hip surgery" Pt responded, "Oh I guess I would ask my wife to help me then".  OT also  re-educated pt that he could use reacher to safely pick up items in order to follow hip precautions.   Pt transported back to room via w/c and complete step pivot to EOB with close supervision. Sit to supine with mod assist to follow hip precautions.Call bell in reach, bed alarm on.  Therapy Documentation Precautions:  Precautions Precautions: Posterior Hip, Fall Precaution Comments: verbally reviewed Required Braces or Orthoses: Knee Immobilizer - Right Knee Immobilizer - Right:  (in bed only, off with therapies) Restrictions Weight Bearing Restrictions: Yes RLE Weight Bearing: Weight bearing as tolerated Other Position/Activity Restrictions: posterior hip precautions R LE    Therapy/Group: Individual Therapy  [name] 12/05/2020, 1:01 PM

## 2020-12-06 NOTE — Progress Notes (Signed)
PROGRESS NOTE    No complaints this morning Discussed discharge on Thursday- he is excited to go home! Denies shortness of breath. Still has some cough but this has improved.   ROS: Patient denies fever, rash, sore throat, blurred vision, nausea, vomiting, diarrhea, cough, shortness of breath or chest pain,  back pain, headache, or mood change.   Objective:   DG HIP PORT UNILAT WITH PELVIS 1V RIGHT  Result Date: 12/04/2020 CLINICAL DATA:  Status post arthroplasty EXAM: DG HIP (WITH OR WITHOUT PELVIS) 1V PORT RIGHT COMPARISON:  November 11, 2020 FINDINGS: Patient is status post right hip replacement. The hardware is in good position. No other interval changes. IMPRESSION: Right hip arthroplasty placement as above. Hardware is in good position. Electronically Signed   By: Dorise Bullion III M.D.   On: 12/04/2020 15:57   No results for input(s): WBC, HGB, HCT, PLT in the last 72 hours.  Recent Labs    12/05/20 0613  NA 133*  K 4.1  CL 103  CO2 22  GLUCOSE 97  BUN 15  CREATININE 1.16  CALCIUM 8.3*     Intake/Output Summary (Last 24 hours) at 12/06/2020 1038 Last data filed at 12/06/2020 0900 Gross per 24 hour  Intake 540 ml  Output 875 ml  Net -335 ml         Physical Exam: Vital Signs Blood pressure 124/64, pulse 70, temperature 98 F (36.7 C), temperature source Oral, resp. rate 16, height 6' (1.829 m), weight 69.6 kg, SpO2 96 %. Gen: no distress, normal appearing HEENT: oral mucosa pink and moist, NCAT Cardio: Reg rate Chest: normal effort, normal rate of breathing Abd: soft, non-distended Ext: no edema Psych: pleasant, normal affect Skin: intact  Musc: Right hip incision with some edema and tenderness Neuro: Alert HOH Motor: Bilateral upper extremities: 5/5 proximal distal Right lower extremity: Hip flexion 2+/5, ankle dorsiflexion 4/5  LLE: Hip flexion, knee extension 4-/5, ankle dorsiflexion  4/5  Assessment/Plan: 1. Functional deficits which require 3+ hours per day of interdisciplinary therapy in a comprehensive inpatient rehab setting. Physiatrist is providing close team supervision and 24 hour management of active medical problems listed below. Physiatrist and rehab team continue to assess barriers to discharge/monitor patient progress toward functional and medical goals  Care Tool:  Bathing    Body parts bathed by patient: Right arm, Left arm, Chest, Abdomen, Face   Body parts bathed by helper: Buttocks, Right lower leg, Left lower leg     Bathing assist Assist Level: Set up assist     Upper Body Dressing/Undressing Upper body dressing   What is the patient wearing?: Pull over shirt    Upper body assist Assist Level: Set up assist    Lower Body Dressing/Undressing Lower body dressing      What is the patient wearing?: Pants     Lower body assist Assist for lower body dressing: Minimal Assistance - Patient > 75% (reacher)     Toileting Toileting    Toileting assist Assist for toileting: Moderate Assistance - Patient 50 - 74%     Transfers Chair/bed transfer  Transfers assist     Chair/bed transfer assist level: Minimal Assistance -  Patient > 75% Chair/bed transfer assistive device: Museum/gallery exhibitions officer assist   Ambulation activity did not occur: Safety/medical concerns  Assist level: Minimal Assistance - Patient > 75% Assistive device: Walker-rolling Max distance: 162ft   Walk 10 feet activity   Assist  Walk 10 feet activity did not occur: Safety/medical concerns  Assist level: Minimal Assistance - Patient > 75% Assistive device: Walker-rolling   Walk 50 feet activity   Assist Walk 50 feet with 2 turns activity did not occur: Safety/medical concerns  Assist level: Minimal Assistance - Patient > 75% Assistive device: Walker-rolling    Walk 150 feet activity   Assist Walk 150 feet activity did not  occur: Safety/medical concerns  Assist level: Minimal Assistance - Patient > 75% Assistive device: Walker-rolling    Walk 10 feet on uneven surface  activity   Assist Walk 10 feet on uneven surfaces activity did not occur: Safety/medical concerns         Wheelchair     Assist Is the patient using a wheelchair?: No Type of Wheelchair: Manual Wheelchair activity did not occur: N/A         Wheelchair 50 feet with 2 turns activity    Assist    Wheelchair 50 feet with 2 turns activity did not occur: N/A       Wheelchair 150 feet activity     Assist  Wheelchair 150 feet activity did not occur: N/A       Blood pressure 124/64, pulse 70, temperature 98 F (36.7 C), temperature source Oral, resp. rate 16, height 6' (1.829 m), weight 69.6 kg, SpO2 96 %.  Medical Problem List and Plan: 1.  R hip fx s/p hemiarthroplasty secondary to multiple falls             -Continue CIR therapies including PT, OT  2.  Impaired mobility: -DVT/anticoagulation:  Pharmaceutical: Continue to hold Lovenox given recent drop in hgb, transfusion  -TEDS, SCD's             -antiplatelet therapy: Continue ASA bid for DVT prophylaxis?-->decreased to once a day as on lovenox.  3. Postoperative Pain: Discontinue Norco, continue Tylenol PRN 4. Mood: LCSW to follow for evaluation and  support.              -antipsychotic agents: N/A 5. Neuropsych: This patient is not fully capable of making decisions on his own behalf. 6. Skin/Wound Care: Monitor wound for healing.  7. Fluids/Electrolytes/Nutrition: Monitor I/O.    -encourage fluids 8. Cough: continue Mucinex to 2 tabs BID, abx treatment  9. GERD: continue Protonix BID.  10. Parkinson's disease: Managed with Sinemet IR TID 11. CAD s/p CABG: Monitor for symptoms with increase in activity. Continue ASA.              --hold lasix due to intermittent low BP.  12. Acute on chronic anemia due to blood loss anemia: increased nicely to 9.0  after 2U transfusion              --resumed iron supplement.   Hemoglobin 9.0 on 9/13  Continue to monitor 13. Constipation: continue Senna S, having a BM every three days but does not want additional intervention at this time. Moved bowels 9/18 14. Hypotension: medications reviewed, not on any medications for hypertension.  Vitals:   12/05/20 2006 12/06/20 0453  BP: 107/65 124/64  Pulse: 76 70  Resp: 18 16  Temp: 98.1 F (36.7 C) 98 F (36.7 C)  SpO2:  97% 96%   Relatively controlled on 9/19 15. Hypokalemia: discontinued supplement  4.1 9/19 16. Aspiration pneumonia: WBC normalized, transitioned to oral abx, stop date entered 69.  Sleep disturbance: replaced trazodone with melatonin given hallucinations with former. Seems to be effective 18. Mild to mod oropharyngeal dysphagia per MBS 9/9, likely esophageal component d/t HH. On regular diet -meds with puree -slow rate, small sips and bites -continue per SLP.  19. AKI: nursing order to encourage 6-8 glasses of water per day, resolved. Continue to monitor creatinine weekly.   Creatinine 1.21 on 9/13-> 1.16 9/19  LOS: 19 days A FACE TO FACE EVALUATION WAS PERFORMED  Jon Reid 12/06/2020, 10:38 AM

## 2020-12-06 NOTE — Progress Notes (Signed)
Occupational Therapy Session Note  Patient Details  Name: Jon Reid MRN: [id number] Date of Birth: 1928-12-03  Today's Date: 12/06/2020 OT Individual Time: 1335-1446 OT Individual Time Calculation (min): 71 min    Short Term Goals: Week 2:  OT Short Term Goal 1 (Week 2): STGs=LTGs due to ELOS  Skilled Therapeutic Interventions/Progress Updates:    Pt in wheelchair to start, declining shower this session.  She was unable to state any THR precautions initially but then was only able to recall 1/3 after 5 min delay.  Took pt down to the tub room where he was educated on need for a tub bench secondary to having a tub shower at home.  He reports having a smaller seat, but this will not be safe at this time.  He was able to complete transfer into the tub and out, adhering to [facility] Show [facility] precautions with overall mod assist and mod demonstrational cueing to avoid flexing less than 90 degrees.  Will continue to need practice and discussion on if he wants to purchase a shower bench.  He was then taken down to the dayroom via wheelchair where he worked on sit to stand, standing balance, and use of the reacher for retrieving items from lower surfaces.  He was able to stand with min guard assist and toss bean bags to the [employer] with the LUE.  He then ambulated over to the board and picked up the bean bags with min guard assist and use of the reacher.  Min instructional cueing to face where he is reaching with the RW for support and not try to turn and reach out to the side.  He completed four intervals of tossing and picking up the bean bags with mod questioning cueing to keep up with the total number of points he had after each set of throws as well as a running total.  Finished session with return to the room where pt was left sitting up in the wheelchair with the safety belt in place and with nursing in to assist with transfer to the bathroom.    Therapy Documentation Precautions:   Precautions Precautions: Posterior Hip, Fall Precaution Booklet Issued: No Precaution Comments: verbally reviewed Required Braces or Orthoses: Knee Immobilizer - Right Knee Immobilizer - Right:  (in bed only, off with therapies) Restrictions Weight Bearing Restrictions: No RLE Weight Bearing: Weight bearing as tolerated Other Position/Activity Restrictions: posterior hip precautions R LE  Pain: Pain Assessment Pain Scale: Faces Faces Pain Scale: Hurts a little bit Pain Type: Acute pain Pain Location: Leg Pain Orientation: Right Pain Descriptors / Indicators: Discomfort Pain Onset: With Activity Pain Intervention(s): Repositioned;Emotional support ADL: See Care Tool Section for some details of mobility and selfcare   Therapy/Group: Individual Therapy  Yulianna Folse OTR/L 12/06/2020, 3:53 PM

## 2020-12-06 NOTE — Progress Notes (Signed)
Occupational Therapy Discharge Summary  Patient Details  Name: Jon Reid MRN: [id number] Date of Birth: Aug 02, 1928  Today's Date: 12/07/2020    Patient has met 6 of 9 long term goals due to improved activity tolerance, improved balance, postural control, ability to compensate for deficits, and improved coordination.  Pt has made progress towards OT goals d/t improved activity tolerance, increased strength and coordination and improved balanc. Pt continues to present with STM deficits with pt having difficulty recalling posterior hip precautions and implementing precautions functionally into ADLs. This OTA has not seen family during sessions for family education. Patient to discharge at overall Supervision level.  Patient's care partner is independent to provide the necessary physical and cognitive assistance at discharge.    Reasons goals not met: pt would often decline bathing and when assessed pt mostly participated in UB bathing. Pt requires assist for LB ADLs d/t hip precautions and benefits from use of reacher for LB ADLS. Pt requires assist for tub shower transfer to TTB to assist with safety related to maintaining hip precautions.   Recommendation:  Patient will benefit from ongoing skilled OT services in home health setting to continue to advance functional skills in the area of BADL and Reduce care partner burden.  Equipment: TTB / HHOT  Reasons for discharge: treatment goals met and discharge from hospital  Patient/family agrees with progress made and goals achieved: Yes  OT Discharge Precautions/Restrictions  Precautions Precautions: Posterior Hip;Fall Precaution Booklet Issued: No Restrictions Weight Bearing Restrictions: No RLE Weight Bearing: Weight bearing as tolerated Pain Pain Assessment Pain Scale: Faces Faces Pain Scale: Hurts a little bit Pain Type: Acute pain Pain Location: Leg Pain Orientation: Right Pain Descriptors / Indicators: Discomfort Pain  Onset: With Activity Pain Intervention(s): Repositioned;Emotional support ADL ADL Equipment Provided: Reacher Eating: Set up Where Assessed-Eating: Chair Grooming: Setup Where Assessed-Grooming: Wheelchair Upper Body Bathing: Setup Where Assessed-Upper Body Bathing: Wheelchair Lower Body Bathing: Moderate assistance Where Assessed-Lower Body Bathing: Wheelchair Upper Body Dressing: Setup Where Assessed-Upper Body Dressing: Wheelchair Lower Body Dressing: Minimal assistance, Other (Comment) (with reahcer) Where Assessed-Lower Body Dressing: Wheelchair Toileting: Contact guard Where Assessed-Toileting: Glass blower/designer: Therapist, music Method: Counselling psychologist: Energy manager: Medical sales representative Method: Scientist, research (physical sciences) ADL Comments: pt requires cues for overall safety awareness d/t hip precautions and impaired cognition Vision Baseline Vision/History: 0 No visual deficits Patient Visual Report: No change from baseline Vision Assessment?: No apparent visual deficits Perception  Perception: Within Functional Limits Praxis Praxis: Intact Cognition Overall Cognitive Status: Within Functional Limits for tasks assessed Arousal/Alertness: Awake/alert Orientation Level: Oriented to situation;Oriented to person;Oriented to place Year:  (2028) Month: October Day of Week: Incorrect Memory: Impaired Memory Impairment: Decreased recall of new information Immediate Memory Recall: Sock;Blue;Bed Memory Recall Sock: Without Cue Memory Recall Blue: Without Cue Memory Recall Bed: Not able to recall Awareness: Appears intact Problem Solving: Impaired (cues for safety awareness) Safety/Judgment: Appears intact Comments: Needs cues for posterior hip precautions and for safety awareness during transfers Sensation Sensation Light Touch: Appears Intact Hot/Cold: Appears  Intact Proprioception: Appears Intact Stereognosis: Appears Intact Coordination Gross Motor Movements are Fluid and Coordinated: No Fine Motor Movements are Fluid and Coordinated: Yes Coordination and Movement Description: limited by R knee pain and tight joint structures. Motor  Motor Motor: Within Functional Limits Motor - Discharge Observations: generalized weakness and deconditioning Mobility  Bed Mobility Bed Mobility: Rolling Right;Rolling Left;Supine to Sit;Sit to Supine Rolling Right: Supervision/verbal cueing  Rolling Left: Supervision/Verbal cueing Supine to Sit: Supervision/Verbal cueing Sit to Supine: Minimal Assistance - Patient > 75%  Trunk/Postural Assessment  Cervical Assessment Cervical Assessment: Exceptions to [facility] (forward head) Thoracic Assessment Thoracic Assessment: Exceptions to [facility] (age related kyphosis and rounded shoulders) Lumbar Assessment Lumbar Assessment: Exceptions to [place] (posterior pelvic tilt) Postural Control Postural Control: Deficits on evaluation (slight lean to the L in sitting, likely to avoid weight on R hip)  Balance Balance Balance Assessed: Yes Static Sitting Balance Static Sitting - Balance Support: Feet supported Static Sitting - Level of Assistance: 7: Independent Dynamic Sitting Balance Dynamic Sitting - Balance Support: Feet supported;No upper extremity supported Dynamic Sitting - Level of Assistance: 5: Stand by assistance Static Standing Balance Static Standing - Balance Support: Bilateral upper extremity supported Static Standing - Level of Assistance: 5: Stand by assistance Dynamic Standing Balance Dynamic Standing - Balance Support: During functional activity Dynamic Standing - Level of Assistance: 4: Min assist Dynamic Standing - Balance Activities: Reaching for weighted objects;Other (comment);Reaching for objects;Reaching across midline Extremity/Trunk Assessment RUE Assessment RUE Assessment: Within Functional  Limits     Jon Reid 12/06/2020, 3:59 PM

## 2020-12-06 NOTE — Progress Notes (Signed)
Occupational Therapy Session Note  Patient Details  Name: Jon Reid MRN: [id number] Date of Birth: 20-Apr-1928  Today's Date: 12/06/2020 OT Individual Time: 1100-1200 OT Individual Time Calculation (min): 60 min    Short Term Goals:  Week 2:  OT Short Term Goal 1 (Week 2): STGs=LTGs due to ELOS   Skilled Therapeutic Interventions/Progress Updates:    Pt sitting up in w/c, reports he would like to wait until last therapy session to complete self care.  Pt reports mild knee soreness but says the Tylenol doesn't really help and agreeable to working with OT.  Rest breaks allowed as needed to address knee pain during session.  Pt transported to dayroom via w/c for time mgt.  Participated in dynamic standing and sit<>stand transafer task with upward reach facilitated to improve standing posture.  Pt then instructed to retrieve bean bags from floor using reacher to encourage carryover to adl item retrieval at home while following posterior hip precautions  Pt required intermittent Vcs to safely manage RW. Close supervision sit<>stand x 4.  Pt then transported to ortho gym and completed 10 minutes at UBE level 4 forward/backward to promote increased endurance and activity tolerance.  Returned to room and call bell in reach, seat belt alarm on upon OT departure.  Therapy Documentation Precautions:  Precautions Precautions: Posterior Hip, Fall Precaution Comments: verbally reviewed Required Braces or Orthoses: Knee Immobilizer - Right Knee Immobilizer - Right:  (in bed only, off with therapies) Restrictions Weight Bearing Restrictions: Yes RLE Weight Bearing: Weight bearing as tolerated Other Position/Activity Restrictions: posterior hip precautions R LE    Therapy/Group: Individual Therapy  [name] 12/06/2020, 3:00 PM

## 2020-12-06 NOTE — Patient Care Conference (Signed)
Inpatient RehabilitationTeam Conference and Plan of Care Update Date: 12/06/2020   Time: 13:12 PM    Patient Name: Jon Reid      Medical Record Number: [id number]  Date of Birth: 10/03/1928 Sex: Male         Room/Bed: 4W08C/4W08C-01 Payor Info: Payor: MEDICARE RAILROAD / Plan: MEDICARE RAILROAD / Product Type: *No Product type* /    Admit Date/Time:  11/17/2020  2:52 PM  Primary Diagnosis:  Femur fracture, right [facility])  Hospital Problems: Principal Problem:   Femur fracture, right ([place]) Active Problems:   S/P hip hemiarthroplasty   Pressure injury of skin   Closed fracture of right hip requiring operative repair, sequela   Protein-calorie malnutrition, severe   AKI (acute kidney injury) ([place])   Hypokalemia   Labile blood pressure   Acute on chronic anemia    Expected Discharge Date: Expected Discharge Date: 12/08/20  Team Members Present: Physician leading conference: Dr. Leeroy Cha Social Worker Present: Ovidio Kin, LCSW Nurse Present: Dorien Chihuahua, RN PT Present: Ginnie Smart, PT OT Present: Leretha Pol, OT SLP Present: Charolett Bumpers, SLP PPS Coordinator present : Gunnar Fusi, SLP     Current Status/Progress Goal Weekly Team Focus  Bowel/Bladder             Swallow/Nutrition/ Hydration             ADL's   close supervision-CGA functional mobility, setup UB self care, min assist LB self care, supervision for hip precaution recall  downgraded to CGA  ADL training, functional mobility, endurance, management of RW, safety awareness   Mobility   minA bed mobility, minA stand<>pivot transfers with RW, gait ~175-228ft with CGA and RW.  Supervision (may not meet supervision goals)  Global strengthening, balance, gait training, improving activity tolerance, increasing safety awareness with functional transfers, DC planning and family ed.   Communication             Safety/Cognition/ Behavioral Observations            Pain             Skin                Discharge Planning:  Have attempted to get family in for education without success-plan to go home with wife unsure if she can do CGA now downgraded to.   Team Discussion: AKI and anemia resolved. Pain only noted with pressure on leg.  Patient on target to meet rehab goals: yes, has made good progress. Patient will need assistance with lower body care  *See Care Plan and progress notes for long and short-term goals.   Revisions to Treatment Plan:  Working on  gait training and activity tolerance, overall strength and balance   Teaching Needs: Safety; tends to leave the assistive device before he has reached his destination, transfers, toileting, medications, etc.  Current Barriers to Discharge: Decreased caregiver support and Home enviroment access/layout  Possible Resolutions to Barriers: Family education with sons     Medical Summary Current Status: shortness of breath resolved, right hip fracture and associated pain, cough improved but still present  Barriers to Discharge: Decreased family/caregiver support;Medical stability;Wound care  Barriers to Discharge Comments: right hip fracture and associated pain, cough improved but still present Possible Resolutions to Barriers/Weekly Focus: continue tylenol for pain control, continue mucinex for cough, continue daily wound care to right hip   Continued Need for Acute Rehabilitation Level of Care: The patient requires daily medical management by a physician with  specialized training in physical medicine and rehabilitation for the following reasons: Direction of a multidisciplinary physical rehabilitation program to maximize functional independence : Yes Medical management of patient stability for increased activity during participation in an intensive rehabilitation regime.: Yes Analysis of laboratory values and/or radiology reports with any subsequent need for medication adjustment and/or medical intervention. : Yes   I  attest that I was present, lead the team conference, and concur with the assessment and plan of the team.   Dorien Chihuahua B 12/06/2020, 4:56 PM

## 2020-12-06 NOTE — Plan of Care (Signed)
Downgraded the following goals due to pt approaching baseline with slow progress: Problem: RH Balance Goal: LTG Patient will maintain dynamic standing with ADLs (OT) Description: LTG:  Patient will maintain dynamic standing balance with assist during activities of daily living (OT)  Flowsheets (Taken 12/06/2020 0854) LTG: Pt will maintain dynamic standing balance during ADLs with: Contact Guard/Touching assist   Problem: Sit to Stand Goal: LTG:  Patient will perform sit to stand in prep for activites of daily living with assistance level (OT) Description: LTG:  Patient will perform sit to stand in prep for activites of daily living with assistance level (OT) Flowsheets (Taken 12/06/2020 0854) LTG: PT will perform sit to stand in prep for activites of daily living with assistance level: Contact Guard/Touching assist   Problem: RH Bathing Goal: LTG Patient will bathe all body parts with assist levels (OT) Description: LTG: Patient will bathe all body parts with assist levels (OT) Flowsheets (Taken 12/06/2020 0854) LTG: Pt will perform bathing with assistance level/cueing: Contact Guard/Touching assist LTG: Position pt will perform bathing: At sink   Problem: RH Dressing Goal: LTG Patient will perform upper body dressing (OT) Description: LTG Patient will perform upper body dressing with assist, with/without cues (OT). Flowsheets (Taken 12/06/2020 0854) LTG: Pt will perform upper body dressing with assistance level of: Set up assist Goal: LTG Patient will perform lower body dressing w/assist (OT) Description: LTG: Patient will perform lower body dressing with assist, with/without cues in positioning using equipment (OT) Flowsheets (Taken 12/06/2020 0854) LTG: Pt will perform lower body dressing with assistance level of: Contact Guard/Touching assist   Problem: RH Toileting Goal: LTG Patient will perform toileting task (3/3 steps) with assistance level (OT) Description: LTG: Patient will  perform toileting task (3/3 steps) with assistance level (OT)  Flowsheets (Taken 12/06/2020 0854) LTG: Pt will perform toileting task (3/3 steps) with assistance level: Contact Guard/Touching assist   Problem: RH Toilet Transfers Goal: LTG Patient will perform toilet transfers w/assist (OT) Description: LTG: Patient will perform toilet transfers with assist, with/without cues using equipment (OT) Flowsheets (Taken 12/06/2020 0854) LTG: Pt will perform toilet transfers with assistance level of: Contact Guard/Touching assist  Problem: RH Tub/Shower Transfers Goal: LTG Patient will perform tub/shower transfers w/assist (OT) Description: LTG: Patient will perform tub/shower transfers with assist, with/without cues using equipment (OT) Flowsheets (Taken 12/06/2020 0854) LTG: Pt will perform tub/shower stall transfers with assistance level of: Contact Guard/Touching assist

## 2020-12-06 NOTE — Progress Notes (Signed)
Physical Therapy Session Note  Patient Details  Name: Jon Reid MRN: [id number] Date of Birth: 03-06-1929  Today's Date: 12/06/2020 PT Individual Time: 0900-1015 PT Individual Time Calculation (min): 75 min   Short Term Goals: Week 3:  PT Short Term Goal 1 (Week 3): STG = LTG d/t ELOS  Skilled Therapeutic Interventions/Progress Updates:     Pt sitting in w/c at start of session - agreeable to therapy without reports of pain. Requesting to use toilet prior to leaving to rehab gym. Donned knee-high TED's and shoes with totalA for time. Sit<>stand to RW with CGA - stiffness and slow to stand. Ambulated with CGA/minA and RW into the bathroom and assisted to sitting on the 3-1 BSC with minA for balance. Pt continent of bladder while sitting on toilet. Donned boxer briefs and shorts with maxA for threading LE's and for pulling over hips in standing. Able to stand with RW support statically with supervision. Ambulated back to his w/c with CGA and RW with mod cues for safety approach and hand placement. Pt transported outside in his w/c to work on functional gait training outdoors on unlevel surfaces. Sit<>stand to RW from w/c height with CGA. Ambulated 2x151ft outdoors with CGA/minA and RW. Intermittent minA needed for steadying while ambulating on unlevel brick pavers. Worked on sit<>stand and safety approaches to park benches that had arm rests, minA overall. Returned upstairs to main rehab gym. Assisted onto Nustep with minA and RW. SetupA required for RLE on Nustep. Workload at 6, using all extremities, completed 10 minutes. Emphasis on increasing R hip/knee extension for AAROM. Instructed on ambulatory transfer with close supervision and RW to the Car - required mod cues for safety approach and technique as he would abandon the RW and grab onto the car door for balance. Required minA for bringing RLE into and out of the car. Next, worked on ambulating up/down ramped incline of ~19ft with minA and RW  - needed cues during descent > ascent for safety awareness and keeping his body within walker frame as he would push it too far forwards.   Completed standing exercise with 4# dowel rod - chest press, shldr press 2x10 with minA needed for balance due to posterior lean. Cues for sequencing, muscle activation, and balance during standing exercises.  Pt returned to his room in w/c at end of session. RN notified of his R knee pain for which Tylenol was provided. Safety belt alarm on and all needs in reach. RN at bedside at end of session.   Therapy Documentation Precautions:  Precautions Precautions: Posterior Hip, Fall Precaution Comments: verbally reviewed Required Braces or Orthoses: Knee Immobilizer - Right Knee Immobilizer - Right:  (in bed only, off with therapies) Restrictions Weight Bearing Restrictions: Yes RLE Weight Bearing: Weight bearing as tolerated Other Position/Activity Restrictions: posterior hip precautions R LE General:    Therapy/Group: Individual Therapy  Jon Reid 12/06/2020, 7:34 AM

## 2020-12-06 NOTE — Plan of Care (Signed)
  Problem: RH Wheelchair Mobility Goal: LTG Patient will propel w/c in controlled environment (PT) Description: LTG: Patient will propel wheelchair in controlled environment, # of feet with assist (PT) Outcome: Not Applicable Note: Pt will be a functional ambulator and not using a w/c at DC Goal: LTG Patient will propel w/c in home environment (PT) Description: LTG: Patient will propel wheelchair in home environment, # of feet with assistance (PT). Outcome: Not Applicable Note: Pt will be a functional ambulator and not using a w/c at DC   Problem: RH Balance Goal: LTG Patient will maintain dynamic sitting balance (PT) Description: LTG:  Patient will maintain dynamic sitting balance with assistance during mobility activities (PT) Flowsheets (Taken 12/06/2020 0900) LTG: Pt will maintain dynamic sitting balance during mobility activities with:: Supervision/Verbal cueing Note: Downgraded due to slow progress and safety concerns with balance Goal: LTG Patient will maintain dynamic standing balance (PT) Description: LTG:  Patient will maintain dynamic standing balance with assistance during mobility activities (PT) Flowsheets (Taken 12/06/2020 0900) LTG: Pt will maintain dynamic standing balance during mobility activities with:: Contact Guard/Touching assist Note: Downgraded due to slow progress and safety concerns with balance   Problem: Sit to Stand Goal: LTG:  Patient will perform sit to stand with assistance level (PT) Description: LTG:  Patient will perform sit to stand with assistance level (PT) Flowsheets (Taken 12/06/2020 0900) LTG: PT will perform sit to stand in preparation for functional mobility with assistance level: Contact Guard/Touching assist Note: Downgraded due to slow progress and safety concerns with balance   Problem: RH Bed Mobility Goal: LTG Patient will perform bed mobility with assist (PT) Description: LTG: Patient will perform bed mobility with assistance,  with/without cues (PT). Flowsheets (Taken 12/06/2020 0902) LTG: Pt will perform bed mobility with assistance level of: Minimal Assistance - Patient > 75% Note: Downgraded due to slow progress and safety concerns with balance   Problem: RH Bed to Chair Transfers Goal: LTG Patient will perform bed/chair transfers w/assist (PT) Description: LTG: Patient will perform bed to chair transfers with assistance (PT). Flowsheets (Taken 12/06/2020 0902) LTG: Pt will perform Bed to Chair Transfers with assistance level: Minimal Assistance - Patient > 75% Note: Downgraded due to slow progress and safety concerns with balance   Problem: RH Car Transfers Goal: LTG Patient will perform car transfers with assist (PT) Description: LTG: Patient will perform car transfers with assistance (PT). Flowsheets (Taken 12/06/2020 0902) LTG: Pt will perform car transfers with assist:: Minimal Assistance - Patient > 75% Note: Downgraded due to slow progress and safety concerns with balance   Problem: RH Ambulation Goal: LTG Patient will ambulate in controlled environment (PT) Description: LTG: Patient will ambulate in a controlled environment, # of feet with assistance (PT). Flowsheets (Taken 12/06/2020 0902) LTG: Pt will ambulate in controlled environ  assist needed:: Contact Guard/Touching assist LTG: Ambulation distance in controlled environment: 158ft Note: Downgraded due to slow progress and safety concerns with balance Goal: LTG Patient will ambulate in home environment (PT) Description: LTG: Patient will ambulate in home environment, # of feet with assistance (PT). Flowsheets (Taken 12/06/2020 0902) LTG: Pt will ambulate in home environ  assist needed:: Contact Guard/Touching assist LTG: Ambulation distance in home environment: 88ft Note: Downgraded due to slow progress and safety concerns with balance

## 2020-12-07 ENCOUNTER — Encounter: Payer: Self-pay | Admitting: *Deleted

## 2020-12-07 MED ORDER — DOCUSATE SODIUM 100 MG PO CAPS
100.0000 mg | ORAL_CAPSULE | Freq: Two times a day (BID) | ORAL | 0 refills | Status: AC
  Filled 2020-12-07: qty 60, 30d supply, fill #0

## 2020-12-07 MED ORDER — CERTAVITE/ANTIOXIDANTS PO TABS
1.0000 | ORAL_TABLET | Freq: Every day | ORAL | 0 refills | Status: AC
  Filled 2020-12-07: qty 30, 30d supply, fill #0

## 2020-12-07 MED ORDER — SENNOSIDES-DOCUSATE SODIUM 8.6-50 MG PO TABS
2.0000 | ORAL_TABLET | Freq: Every day | ORAL | 0 refills | Status: AC
  Filled 2020-12-07: qty 60, 30d supply, fill #0

## 2020-12-07 MED ORDER — MELATONIN 5 MG PO TABS
5.0000 mg | ORAL_TABLET | Freq: Every day | ORAL | 0 refills | Status: AC
  Filled 2020-12-07: qty 30, 30d supply, fill #0

## 2020-12-07 MED ORDER — ASPIRIN 81 MG PO TBEC
81.0000 mg | DELAYED_RELEASE_TABLET | Freq: Every day | ORAL | 0 refills | Status: AC
  Filled 2020-12-07: qty 30, 30d supply, fill #0

## 2020-12-07 MED ORDER — DEXTROMETHORPHAN-GUAIFENESIN 10-100 MG/5ML PO SYRP
5.0000 mL | ORAL_SOLUTION | Freq: Four times a day (QID) | ORAL | 0 refills | Status: DC
  Filled 2020-12-07: qty 236, 12d supply, fill #0

## 2020-12-07 MED ORDER — PANTOPRAZOLE SODIUM 40 MG PO TBEC
40.0000 mg | DELAYED_RELEASE_TABLET | Freq: Two times a day (BID) | ORAL | 0 refills | Status: DC
  Filled 2020-12-07: qty 60, 30d supply, fill #0

## 2020-12-07 NOTE — Progress Notes (Signed)
Inpatient Rehabilitation Care Coordinator Discharge Note   Patient Details  Name: Jon Reid MRN: [id number] Date of Birth: [birth year]   Discharge location: HOME WITH WIFE AND TWIN SON'S ARE IN AND OUT WORK DURING THE DAY  Length of Stay:  21 DAYS  Discharge activity level: [place]  Home/community participation: ACTIVE  Patient response ML:JQGBEE Literacy - How often do you need to have someone help you when you read instructions, pamphlets, or other written material from your doctor or pharmacy?: Never  Patient response FE:OFHQRF Isolation - How often do you feel lonely or isolated from those around you?: Never  Services provided included: MD, RD, PT, OT, SLP, RN, CM, Pharmacy, SW  Financial Services:  Financial Services Utilized: Regulatory affairs officer  Choices offered to/list presented to: PT AND SON  Follow-up services arranged:  Home Health, Patient/Family has no preference for HH/DME agencies [place].        Patient response to transportation need: Is the patient able to respond to transportation needs?: Yes In the past 12 months, has lack of transportation kept you from medical appointments or from getting medications?: No In the past 12 months, has lack of transportation kept you from meetings, work, or from getting things needed for daily living?: No    Comments (or additional information):PT FEELS READY TO [place]. NO FAMILY EDUCATION WAS COMPLETED DUE TO FAMILY DID NOT SCHEDULE OR CALL WORKER BACK. PT REPORTS HE TOLD THEM WHAT THEY NEEDED TO KNOW AND CAN SHOW WHAT HE NEEDS ASSIST WITH. PT HAS SAFETY ISSUES IE LEAVES ROLLING WALKER AND IS UNSAFE AT TIMES MOVING. WILL BE HIGH RISK TO FALL AT HOME.  Patient/Family verbalized understanding of follow-up arrangements:  Yes  Individual responsible for coordination of the  follow-up plan: LYNN-SON [phone]  Confirmed correct DME delivered: Elease Hashimoto 12/07/2020    Dameion Briles, [place]

## 2020-12-07 NOTE — Discharge Instructions (Addendum)
Inpatient Rehab Discharge Instructions  Jon Reid Discharge date and time: 12/07/20   Activities/Precautions/ Functional Status: Activity: no lifting, driving, or strenuous exercise for till cleared by MD Diet: Needs supervision at meals Has to be upright and stay upright for 45 minutes after meals. Eat small bites, small sips. Take medications with applesauce or pudding.   Wound Care: keep wound clean and dryContact MD if you develop any problems with your incision/wound--redness, swelling, increase in pain, drainage or if you develop fever or chills.     Functional status:  ___ No restrictions     ___ Walk up steps independently __X_ 24/7 supervision/assistance   ___ Walk up steps with assistance ___ Intermittent supervision/assistance  ___ Bathe/dress independently ___ Walk with walker     ___ Bathe/dress with assistance ___ Walk Independently    ___ Shower independently _X__ Walk with assistance    _X__ Shower with assistance _X__ No alcohol     ___ Return to work/school ________   Special Instructions: Continue right total hip precautions--no bending, raising leg over 90 degrees or turning leg inwards Need to increase fluid intake to 5 glasses per day Cough medication decreased to every 6 hours-->can increase to every 4 hours if needed.     My questions have been answered and I understand these instructions. I will adhere to these goals and the provided educational materials after my discharge from the hospital.  Patient/Caregiver Signature _______________________________ Date __________  Clinician Signature _______________________________________ Date __________  Please bring this form and your medication list with you to all your follow-up doctor's appointments.

## 2020-12-07 NOTE — Progress Notes (Signed)
Physical Therapy Discharge Summary  Patient Details  Name: Jon Reid MRN: [id number] Date of Birth: 03-Dec-1928  Today's Date: 12/07/2020 PT Individual Time: 3419-6222 PT Individual Time Calculation (min): 56 min    Patient has met 8 of 9 long term goals due to improved activity tolerance, improved balance, improved postural control, increased strength, increased range of motion, decreased pain, ability to compensate for deficits, and functional use of  right lower extremity.  Patient to discharge at an ambulatory level  CGA .   Patient's care partner is independent to provide the necessary physical and cognitive assistance at discharge.  Reasons goals not met: Pt required minA for dynamic standing balance due to R hip weakness and posterior bias - primarily with no UE support.   Recommendation:  Patient will benefit from ongoing skilled PT services in home health setting to continue to advance safe functional mobility, address ongoing impairments in RLE weakness, global deconditioning and weakness, gait deficits, static and dynamic standing balance, home safety training, family training, ongoing fall prevention strategies, and minimize fall risk.  Equipment: No equipment provided. Pt owns a RW  Reasons for discharge: treatment goals met and discharge from hospital  Patient/family agrees with progress made and goals achieved: Yes  PT Discharge Precautions/Restrictions Precautions Precautions: Posterior Hip;Fall Precaution Booklet Issued: Yes (comment) Precaution Comments: Handout provided as well as verbally reviewed Required Braces or Orthoses: Knee Immobilizer - Right Knee Immobilizer - Right:  (In bed only) Restrictions Weight Bearing Restrictions: No RLE Weight Bearing: Weight bearing as tolerated Other Position/Activity Restrictions: posterior hip precautions R LE Pain Pain Assessment Pain Scale: 0-10 Pain Score: 0-No pain Pain Interference Pain Interference Pain  Effect on Sleep: 0. Does not apply - I have not had any pain or hurting in the past 5 days Pain Interference with Therapy Activities: 3. Frequently Pain Interference with Day-to-Day Activities: 3. Frequently Vision/Perception  Perception Perception: Within Functional Limits Praxis Praxis: Intact  Cognition Overall Cognitive Status: Within Functional Limits for tasks assessed Arousal/Alertness: Awake/alert Orientation Level: Oriented X4 Problem Solving: Impaired (needs cues for safety awareness during transfers and for posterior hip precautions) Safety/Judgment: Appears intact Comments: Needs cues for posterior hip precautions and for safety awareness during transfers Sensation Sensation Light Touch: Appears Intact Hot/Cold: Appears Intact Proprioception: Appears Intact Stereognosis: Appears Intact Coordination Gross Motor Movements are Fluid and Coordinated: No Fine Motor Movements are Fluid and Coordinated: Yes Coordination and Movement Description: limited by R knee pain and tight joint structures. Motor  Motor Motor: Within Functional Limits Motor - Discharge Observations: generalized weakness and deconditioning  Mobility Bed Mobility Bed Mobility: Rolling Right;Rolling Left;Supine to Sit;Sit to Supine Rolling Right: Supervision/verbal cueing Rolling Left: Supervision/Verbal cueing Supine to Sit: Supervision/Verbal cueing Sit to Supine: Minimal Assistance - Patient > 75% (for RLE only) Transfers Transfers: Sit to Stand;Stand Pivot Transfers;Stand to Sit Sit to Stand: Contact Guard/Touching assist Stand to Sit: Contact Guard/Touching assist Stand Pivot Transfers: Contact Guard/Touching assist Stand Pivot Transfer Details: Verbal cues for technique;Verbal cues for gait pattern;Verbal cues for safe use of DME/AE;Verbal cues for precautions/safety;Verbal cues for sequencing;Visual cues for safe use of DME/AE;Visual cues/gestures for precautions/safety Transfer (Assistive  device): Rolling walker Locomotion  Gait Ambulation: Yes Gait Assistance: Contact Guard/Touching assist Gait Distance (Feet): 150 Feet Assistive device: Rolling walker Gait Assistance Details: Verbal cues for gait pattern;Verbal cues for safe use of DME/AE;Verbal cues for precautions/safety;Verbal cues for technique;Visual cues for safe use of DME/AE Gait Gait: Yes Gait Pattern: Impaired Gait Pattern: Antalgic;Decreased stance time -  right;Right flexed knee in stance;Left flexed knee in stance;Trunk flexed Gait velocity: decreased. Stairs / Additional Locomotion Stairs: Yes Curb: Minimal Assistance - Patient >75% (with RW) Wheelchair Mobility Wheelchair Mobility: No  Trunk/Postural Assessment  Cervical Assessment Cervical Assessment: Exceptions to [facility] (forward head) Thoracic Assessment Thoracic Assessment: Exceptions to [facility] (age related kyphosis and rounded shoulders) Lumbar Assessment Lumbar Assessment: Exceptions to [facility] (posterior pelvic tilt) Postural Control Postural Control: Deficits on evaluation (slight lean to the L in sitting, likely to avoid weight on R hip)  Balance Balance Balance Assessed: Yes Static Sitting Balance Static Sitting - Balance Support: Feet supported Static Sitting - Level of Assistance: 7: Independent Dynamic Sitting Balance Dynamic Sitting - Balance Support: Feet supported;No upper extremity supported Dynamic Sitting - Level of Assistance: 5: Stand by assistance Static Standing Balance Static Standing - Balance Support: Bilateral upper extremity supported Static Standing - Level of Assistance: 5: Stand by assistance Dynamic Standing Balance Dynamic Standing - Balance Support: During functional activity Dynamic Standing - Level of Assistance: 4: Min assist Dynamic Standing - Balance Activities: Reaching for weighted objects;Other (comment) Dynamic Standing - Comments: standing exercises or dynamic balance activities Extremity Assessment   RLE  Assessment RLE Assessment: Exceptions to [facility] General Strength Comments: Ankle DF 5/5, knee ext 4-/5, knee flex 3+/5 hip flex 3/5 LLE Assessment LLE Assessment: Exceptions to [facility] General Strength Comments: grossly 4/5   Skilled Intervention: Pt seated in recliner to start - awake and agreeable to therapy tx. Reports no pain - treatment to tolerance. Sit<>stand to RW with close supervision. Ambulatory transfer to w/c in his room with RW and close supervision as well. Transported to main rehab gym for time. Gait training on level ground with RW and CGA for steadying - ambulated ~151f with cues for increasing stride length and keeping body within walker frame. Reviewed posterior hip precautions as pt was unable to recall 0/3 precautions. Handout provided to improve carryover and placed in his resource handbook. Pt then performed stair training - navigated up/down 8 + 8 (without rest break) smaller 3inch steps with 2 hand rails and CGA for steadying - needing mod cues for stepping pattern for ascending with stronger L foot and descending with weaker R foot.  Then worked on cWater engineerwhere he went up/down an 8inch curb x4 bouts total with CGA and RW - cues again needed for sequencing and setup but no formal LOB or knee buckling observed. Completed session with standing there-ex with 3# bar, completing 2x15 shldr press + chest press combo with CGA for steadying. During rest breaks throughout the session - reviewed home safety training, DME rec's and [place] roles of f/u therapies, fall prevention strategies, energy conservation, etc. Pt returned to his room in w/c for time and assisted back to the recliner with CGA and RW. Remained in recliner with BLE elevated and chair alarm on. All needs in reach.   Lenyx Boody P Marybelle Giraldo 12/07/2020, 7:44 AM

## 2020-12-07 NOTE — Progress Notes (Signed)
Physical Therapy Session Note  Patient Details  Name: Jon Reid MRN: [id number] Date of Birth: [birth year]  Today's Date: 12/07/2020 PT Individual Time: 0805-0905 PT Individual Time Calculation (min): 60 min   Short Term Goals: Week 3:  PT Short Term Goal 1 (Week 3): STG = LTG d/t ELOS   Skilled Therapeutic Interventions/Progress Updates: Pt presented at toilet with NT present agreeable to therapy. Pt denies pain during session. Fist part of session emphasis on functional transfers as pt would state complete having BM ambulate to sink then have another episode of urgency. Pt demonstrated CGA for all Sit to stand transfers from elevated toilet and was able to complete peri-care with supervision however PTA used washcloth each time to ensure cleanliness. Pt took 4 trips between sink or bed and bathroom through this duration. Once bowels were empty pt ambulated to bed and sat to don shorts using reacher to maintain hip precautions. Pt then performed ambulatory transfer to sink and transported to ortho gym for time conservation. Pt participated in car transfer, ambulation up ramp and on mulch all performed overall with CGA however requiring verbal cues for staying within RW and pacing particularly during downward ramp. Pt then transported to rehab gym and performed x1 step up 5in step with RW and CGA with verbal cues for sequencing. Pt then ambulated ~185f with RW and CGA with verbal cues to maintain close proximity to RW. Pt transported remaining distance to room and remained in w/c with belt alarm on, call bell within reach and needs met.      Therapy Documentation Precautions:  Precautions Precautions: Posterior Hip, Fall Precaution Booklet Issued: Yes (comment) Precaution Comments: Handout provided as well as verbally reviewed Required Braces or Orthoses: Knee Immobilizer - Right Knee Immobilizer - Right:  (in bed only) Restrictions Weight Bearing Restrictions: No RLE Weight Bearing:  Weight bearing as tolerated Other Position/Activity Restrictions: posterior hip precautions R LE General:   Vital Signs: Therapy Vitals Temp: 98.2 F (36.8 C) Temp Source: Oral Pulse Rate: 77 Resp: 17 BP: (!) 106/54 Patient Position (if appropriate): Sitting Oxygen Therapy SpO2: 100 % O2 Device: Room Air Pain: Pain Assessment Pain Scale: 0-10 Pain Score: 0-No pain Mobility: Bed Mobility Bed Mobility: Rolling Right;Rolling Left;Supine to Sit;Sit to Supine Rolling Right: Supervision/verbal cueing Rolling Left: Supervision/Verbal cueing Supine to Sit: Supervision/Verbal cueing Sit to Supine: Minimal Assistance - Patient > 75% Locomotion :    Trunk/Postural Assessment :    Balance:   Exercises:   Other Treatments:      Therapy/Group: Individual Therapy  Janiesha Diehl 12/07/2020, 4:13 PM

## 2020-12-07 NOTE — Progress Notes (Signed)
PROGRESS NOTE    Subjective: No complaints this morning Still with cough Commended him on his excellent progress! He feels ready to go home tomorrow.   ROS: Patient denies fever, rash, sore throat, blurred vision, nausea, vomiting, diarrhea, cough, shortness of breath or chest pain,  back pain, headache, or mood change.   Objective:   No results found. No results for input(s): WBC, HGB, HCT, PLT in the last 72 hours.  Recent Labs    12/05/20 0613  NA 133*  K 4.1  CL 103  CO2 22  GLUCOSE 97  BUN 15  CREATININE 1.16  CALCIUM 8.3*     Intake/Output Summary (Last 24 hours) at 12/07/2020 1053 Last data filed at 12/07/2020 0745 Gross per 24 hour  Intake 720 ml  Output 800 ml  Net -80 ml         Physical Exam: Vital Signs Blood pressure 128/62, pulse 76, temperature 98.9 F (37.2 C), temperature source Oral, resp. rate 18, height 6' (1.829 m), weight 69.6 kg, SpO2 96 %. Gen: no distress, normal appearing HEENT: oral mucosa pink and moist, NCAT Cardio: Reg rate Chest: normal effort, normal rate of breathing, +cough Abd: soft, non-distended Ext: no edema Psych: pleasant, normal affect Skin: intact  Musc: Right hip incision with some edema and tenderness Neuro: Alert HOH Motor: Bilateral upper extremities: 5/5 proximal distal Right lower extremity: Hip flexion 2+/5, ankle dorsiflexion 4/5  LLE: Hip flexion, knee extension 4-/5, ankle dorsiflexion 4/5  Assessment/Plan: 1. Functional deficits which require 3+ hours per day of interdisciplinary therapy in a comprehensive inpatient rehab setting. Physiatrist is providing close team supervision and 24 hour management of active medical problems listed below. Physiatrist and rehab team continue to assess barriers to discharge/monitor patient progress toward functional and medical goals  Care Tool:  Bathing    Body parts bathed by patient: Right arm, Left arm,  Chest, Abdomen, Face   Body parts bathed by helper: Buttocks, Right lower leg, Left lower leg     Bathing assist Assist Level: Set up assist     Upper Body Dressing/Undressing Upper body dressing   What is the patient wearing?: Pull over shirt    Upper body assist Assist Level: Set up assist    Lower Body Dressing/Undressing Lower body dressing      What is the patient wearing?: Pants     Lower body assist Assist for lower body dressing: Minimal Assistance - Patient > 75% (reacher)     Toileting Toileting    Toileting assist Assist for toileting: Contact Guard/Touching assist     Transfers Chair/bed transfer  Transfers assist     Chair/bed transfer assist level: Supervision/Verbal cueing Chair/bed transfer assistive device: Programmer, multimedia   Ambulation assist   Ambulation activity did not occur: Safety/medical concerns  Assist level: Minimal Assistance - Patient > 75% Assistive device: Walker-rolling Max distance: 135ft   Walk 10 feet activity   Assist  Walk 10 feet activity did not occur: Safety/medical concerns  Assist level: Minimal Assistance - Patient > 75% Assistive device: Walker-rolling   Walk 50 feet activity   Assist Walk 50 feet with 2 turns activity did not  occur: Safety/medical concerns  Assist level: Minimal Assistance - Patient > 75% Assistive device: Walker-rolling    Walk 150 feet activity   Assist Walk 150 feet activity did not occur: Safety/medical concerns  Assist level: Minimal Assistance - Patient > 75% Assistive device: Walker-rolling    Walk 10 feet on uneven surface  activity   Assist Walk 10 feet on uneven surfaces activity did not occur: Safety/medical concerns   Assist level: Contact Guard/Touching assist Assistive device: Walker-rolling   Wheelchair     Assist Is the patient using a wheelchair?: No Type of Wheelchair: Manual Wheelchair activity did not occur: N/A          Wheelchair 50 feet with 2 turns activity    Assist    Wheelchair 50 feet with 2 turns activity did not occur: N/A       Wheelchair 150 feet activity     Assist  Wheelchair 150 feet activity did not occur: N/A       Blood pressure 128/62, pulse 76, temperature 98.9 F (37.2 C), temperature source Oral, resp. rate 18, height 6' (1.829 m), weight 69.6 kg, SpO2 96 %.  Medical Problem List and Plan: 1.  R hip fx s/p hemiarthroplasty secondary to multiple falls             -Continue CIR therapies including PT, OT  2.  Impaired mobility: -DVT/anticoagulation:  Pharmaceutical: Continue to hold Lovenox given recent drop in hgb, transfusion  -TEDS, SCD's             -antiplatelet therapy: Continue ASA bid for DVT prophylaxis?-->decreased to once a day as on lovenox.  3. Postoperative Pain: Discontinue Norco, continue Tylenol PRN 4. Mood: LCSW to follow for evaluation and  support.              -antipsychotic agents: N/A 5. Neuropsych: This patient is not fully capable of making decisions on his own behalf. 6. Skin/Wound Care: Monitor wound for healing.  7. Fluids/Electrolytes/Nutrition: Monitor I/O.    -encourage fluids 8. Cough: continue Mucinex to 2 tabs BID, abx treatment  9. GERD: continue Protonix BID.  10. Parkinson's disease: Managed with Sinemet IR TID 11. CAD s/p CABG: Monitor for symptoms with increase in activity. Continue ASA.              --hold lasix due to intermittent low BP.  12. Acute on chronic anemia due to blood loss anemia: increased nicely to 9.0 after 2U transfusion              --resumed iron supplement.   Hemoglobin 9.0 on 9/13  Continue to monitor 13. Constipation: continue Senna S, having a BM every three days but does not want additional intervention at this time. Moved bowels 9/18 14. Hypotension: medications reviewed, not on any medications for hypertension.  Vitals:   12/06/20 1955 12/07/20 0318  BP: 112/64 128/62  Pulse: 77 76  Resp:  18 18  Temp: 98.4 F (36.9 C) 98.9 F (37.2 C)  SpO2: 100% 96%   Relatively controlled on 9/21 15. Hypokalemia: discontinued supplement  4.1 9/19 16. Aspiration pneumonia: WBC normalized, transitioned to oral abx, stop date entered 62.  Sleep disturbance: replaced trazodone with melatonin given hallucinations with former. Seems to be effective 18. Mild to mod oropharyngeal dysphagia per MBS 9/9, likely esophageal component d/t HH. On regular diet -meds with puree -slow rate, small sips and bites -continue per SLP.  19. AKI: nursing order to encourage 6-8 glasses of water per day,  resolved. Continue to monitor creatinine outpatient  Creatinine 1.21 on 9/13-> 1.16 9/19  LOS: 20 days A FACE TO FACE EVALUATION WAS PERFORMED  Clide Deutscher Jadon Ressler 12/07/2020, 10:53 AM

## 2020-12-07 NOTE — Progress Notes (Signed)
Occupational Therapy Session Note  Patient Details  Name: Jon Reid MRN: [id number] Date of Birth: 1929-03-04  Today's Date: 12/07/2020 OT Individual Time: 1046-1130 OT Individual Time Calculation (min): 44 min    Short Term Goals: Week 2:  OT Short Term Goal 1 (Week 2): STGs=LTGs due to ELOS  Skilled Therapeutic Interventions/Progress Updates:  Pt greeted seated in w/c agreeable to OT intervention. Pt declined ADL participation. Pt transported to day room with total A for time mgmt. Session focus on functional mobility in apartment while maintaining posterior hip precautions. Pt required MIN A for bed mobility to transition from sitting to supine with assist needed to elevate BLEs onto bed. CGA to transition from supine>sitting. Pt completed multiple ambulatory transfers in apartment with CGA with RW. Pt completed tub shower transfer to TTB with MIN A needing assist to maneuver BLEs over threshold of tub. Extensive time spent on education related to maintaining posterior hip precautions, issued hand out and had pt teach back precautions to this OTA using handout as a reference. Pt left seated in recliner with chair alarm activated and all needs within reach.   Therapy Documentation Precautions:  Precautions Precautions: Posterior Hip, Fall Precaution Booklet Issued: Yes (comment) Precaution Comments: Handout provided as well as verbally reviewed Required Braces or Orthoses: Knee Immobilizer - Right Knee Immobilizer - Right:  (In bed only) Restrictions Weight Bearing Restrictions: No RLE Weight Bearing: Weight bearing as tolerated Other Position/Activity Restrictions: posterior hip precautions R LE    Pain: pt reports no pain during session.     Therapy/Group: Individual Therapy  Precious Haws 12/07/2020, 12:54 PM

## 2020-12-07 NOTE — Progress Notes (Signed)
Physical Therapy Session Note  Patient Details  Name: Jon Reid MRN: [id number] Date of Birth: 1928-05-09  Today's Date: 12/07/2020 PT Individual Time: 1300-1330 PT Individual Time Calculation (min): 30 min   Short Term Goals: Week 3:  PT Short Term Goal 1 (Week 3): STG = LTG d/t ELOS  Skilled Therapeutic Interventions/Progress Updates:    Pt received seated in recliner in room, agreeable to PT session. No complaints of pain. Sit to stand with CGA to RW throughout session. Stand pivot transfer to bed with RW and CGA. Sit to/from supine on flat bed with Supervision, utilized gait belt around RLE for leg lifter. Pt returned to recliner. Seated BLE strengthening therex: marches, LAQ, hip add squeeze x 10 reps each. Standing BLE strengthening therex: marches, hip abd x 10 reps each. Pt left seated in recliner in room with needs in reach, chair alarm in place at end of session.  Therapy Documentation Precautions:  Precautions Precautions: Posterior Hip, Fall Precaution Booklet Issued: Yes (comment) Precaution Comments: Handout provided as well as verbally reviewed Required Braces or Orthoses: Knee Immobilizer - Right Knee Immobilizer - Right:  (in bed only) Restrictions Weight Bearing Restrictions: No RLE Weight Bearing: Weight bearing as tolerated Other Position/Activity Restrictions: posterior hip precautions R LE   Therapy/Group: Individual Therapy   Excell Seltzer, PT, DPT, CSRS  12/07/2020, 5:23 PM

## 2020-12-08 ENCOUNTER — Other Ambulatory Visit (HOSPITAL_COMMUNITY): Payer: Self-pay

## 2020-12-08 NOTE — Progress Notes (Signed)
Patient ID: Jon Reid, male   DOB: 08-26-1928, 87 y.o.   MRN: [id number] Pt and wife felt a tub bench was not needed. So cancelled order for one.

## 2020-12-08 NOTE — Progress Notes (Addendum)
Discharge Note:  Patient alert and oriented x4 this shift, compliant with medication administration. Denies pain. No signs or symptoms of distress noted. Steri-strips to surgical incision remain intact to right hip; No signs or symptoms of infection noted.  Skin non-tenting and normal for ethnic group. Jon Chew, PA provided/educated patient and Son on discharge instructions. Patient and Son verbalized understanding. Staff assisted patient into wheelchair and into private car for departure. Patient free of complications; successfully discharged.

## 2020-12-08 NOTE — Progress Notes (Signed)
PROGRESS NOTE    Subjective: No complaints this morning.  Ready for d/c home to wife and twin sons  ROS: Patient denies fever, rash, sore throat, blurred vision, nausea, vomiting, diarrhea, cough, shortness of breath or chest pain,  back pain, headache, or mood change.   Objective:   No results found. No results for input(s): WBC, HGB, HCT, PLT in the last 72 hours.  No results for input(s): NA, K, CL, CO2, GLUCOSE, BUN, CREATININE, CALCIUM in the last 72 hours.    Intake/Output Summary (Last 24 hours) at 12/08/2020 0847 Last data filed at 12/08/2020 0749 Gross per 24 hour  Intake 880 ml  Output 800 ml  Net 80 ml         Physical Exam: Vital Signs Blood pressure 134/66, pulse 72, temperature 98.1 F (36.7 C), resp. rate 18, height 6' (1.829 m), weight 69.6 kg, SpO2 97 %. Gen: no distress, normal appearing HEENT: oral mucosa pink and moist, NCAT Cardio: Reg rate Chest: normal effort, normal rate of breathing Abd: soft, non-distended Ext: no edema Psych: pleasant, normal affect Skin: intact   Musc: Right hip incision with some edema and tenderness Neuro: Alert HOH Motor: Bilateral upper extremities: 5/5 proximal distal Right lower extremity: Hip flexion 2+/5, ankle dorsiflexion 4/5  LLE: Hip flexion, knee extension 4-/5, ankle dorsiflexion 4/5  Assessment/Plan: 1. Functional deficits which require 3+ hours per day of interdisciplinary therapy in a comprehensive inpatient rehab setting. Physiatrist is providing close team supervision and 24 hour management of active medical problems listed below. Physiatrist and rehab team continue to assess barriers to discharge/monitor patient progress toward functional and medical goals  Care Tool:  Bathing    Body parts bathed by patient: Right arm, Left arm, Chest, Abdomen, Face   Body parts bathed by helper: Buttocks, Right lower leg, Left lower leg     Bathing  assist Assist Level: Set up assist     Upper Body Dressing/Undressing Upper body dressing   What is the patient wearing?: Pull over shirt    Upper body assist Assist Level: Set up assist    Lower Body Dressing/Undressing Lower body dressing      What is the patient wearing?: Pants     Lower body assist Assist for lower body dressing: Minimal Assistance - Patient > 75% (reacher)     Toileting Toileting    Toileting assist Assist for toileting: Contact Guard/Touching assist     Transfers Chair/bed transfer  Transfers assist     Chair/bed transfer assist level: Contact Guard/Touching assist Chair/bed transfer assistive device: Programmer, multimedia   Ambulation assist   Ambulation activity did not occur: Safety/medical concerns  Assist level: Contact Guard/Touching assist Assistive device: Walker-rolling Max distance: 12ft   Walk 10 feet activity   Assist  Walk 10 feet activity did not occur: Safety/medical concerns  Assist level: Contact Guard/Touching assist Assistive device: Walker-rolling   Walk 50 feet activity   Assist Walk 50 feet with 2 turns activity did not occur: Safety/medical concerns  Assist level: Contact Guard/Touching assist Assistive device: Walker-rolling    Walk 150 feet activity   Assist Walk 150 feet activity did not occur: Safety/medical  concerns  Assist level: Contact Guard/Touching assist Assistive device: Walker-rolling    Walk 10 feet on uneven surface  activity   Assist Walk 10 feet on uneven surfaces activity did not occur: Safety/medical concerns   Assist level: Contact Guard/Touching assist Assistive device: Walker-rolling   Wheelchair     Assist Is the patient using a wheelchair?: No Type of Wheelchair: Manual Wheelchair activity did not occur: N/A         Wheelchair 50 feet with 2 turns activity    Assist    Wheelchair 50 feet with 2 turns activity did not occur: N/A        Wheelchair 150 feet activity     Assist  Wheelchair 150 feet activity did not occur: N/A       Blood pressure 134/66, pulse 72, temperature 98.1 F (36.7 C), resp. rate 18, height 6' (1.829 m), weight 69.6 kg, SpO2 97 %.  Medical Problem List and Plan: 1.  R hip fx s/p hemiarthroplasty secondary to multiple falls             -Continue CIR therapies including PT, OT  2.  Impaired mobility: -DVT/anticoagulation:  Pharmaceutical: Continue to hold Lovenox given recent drop in hgb, transfusion  -TEDS, SCD's             -antiplatelet therapy: Continue ASA bid for DVT prophylaxis?-->decreased to once a day as on lovenox.  3. Postoperative Pain: Discontinue Norco, continue Tylenol PRN 4. Mood: LCSW to follow for evaluation and  support.              -antipsychotic agents: N/A 5. Neuropsych: This patient is not fully capable of making decisions on his own behalf. 6. Skin/Wound Care: Monitor wound for healing.  7. Fluids/Electrolytes/Nutrition: Monitor I/O.    -encourage fluids 8. Cough: continue Mucinex to 2 tabs BID, abx treatment  9. GERD: continue Protonix BID.  10. Parkinson's disease: Managed with Sinemet IR TID 11. CAD s/p CABG: Monitor for symptoms with increase in activity. Continue ASA.              --hold lasix due to intermittent low BP.  12. Acute on chronic anemia due to blood loss anemia: increased nicely to 9.0 after 2U transfusion              --resumed iron supplement.   Hemoglobin 9.0 on 9/13  Continue to monitor 13. Constipation: continue Senna S, having a BM every three days but does not want additional intervention at this time. Moved bowels 9/18 14. Hypotension: medications reviewed, not on any medications for hypertension.  Vitals:   12/07/20 2045 12/08/20 0454  BP: 127/62 134/66  Pulse: 69 72  Resp: 18 18  Temp: 97.6 F (36.4 C) 98.1 F (36.7 C)  SpO2: 99% 97%   Relatively controlled on 9/21 15. Hypokalemia: discontinued supplement  4.1 9/19 16.  Aspiration pneumonia: WBC normalized, transitioned to oral abx, stop date entered 58.  Sleep disturbance: replaced trazodone with melatonin given hallucinations with former. Seems to be effective 18. Mild to mod oropharyngeal dysphagia per MBS 9/9, likely esophageal component d/t HH. On regular diet -meds with puree -slow rate, small sips and bites -continue per SLP.  19. AKI: nursing order to encourage 6-8 glasses of water per day, resolved. Continue to monitor creatinine outpatient  Creatinine 1.21 on 9/13-> 1.16 9/19   >30 minutes spent in discharge of patient including review of medications and follow-up appointments, physical examination, and in answering all patient's questions  LOS: 21 days A FACE TO FACE EVALUATION WAS PERFORMED  Izora Ribas 12/08/2020, 8:47 AM

## 2020-12-08 NOTE — Discharge Summary (Signed)
Physician Discharge Summary  Patient ID: Jon Reid MRN: [id number] DOB/AGE: Nov 19, 1928 87 y.o.  Admit date: 11/17/2020 Discharge date: 12/08/2020  Discharge Diagnoses:  Principal Problem:   Femur fracture, right ([place]) Active Problems:   Dysphagia   S/P hip hemiarthroplasty   Pressure injury of skin   Protein-calorie malnutrition, severe   AKI (acute kidney injury) ([place])   Hypokalemia   Acute on chronic anemia   Cough   Discharged Condition: stable  Significant Diagnostic Studies: DG Chest 2 View  Result Date: 11/24/2020 CLINICAL DATA:  Follow-up cough, congestion EXAM: CHEST - 2 VIEW COMPARISON:  11/23/2020 FINDINGS: No significant change in examination with gross cardiomegaly and large hiatal hernia. Small bilateral pleural effusions and associated atelectasis or consolidation, unchanged. No new airspace opacity. Disc degenerative disease of the thoracic spine. IMPRESSION: No significant change in examination with gross cardiomegaly and large hiatal hernia. Small bilateral pleural effusions and associated atelectasis or consolidation, unchanged. No new airspace opacity. Electronically Signed   By: Eddie Candle M.D.   On: 11/24/2020 09:48   DG Chest 2 View  Result Date: 11/23/2020 CLINICAL DATA:  Cough, congestion EXAM: CHEST - 2 VIEW COMPARISON:  11/17/2020 FINDINGS: Post CABG. Stable heart size. Atherosclerotic calcification of the aortic knob. Large hiatal hernia. Streaky right basilar opacity which may reflect atelectasis. Persistent bandlike opacity at the left lung base, scarring or atelectasis. Small bilateral pleural effusions. No pneumothorax. IMPRESSION: 1. Small bilateral pleural effusions. 2. Streaky bibasilar opacities which may reflect atelectasis. 3. Large hiatal hernia. Electronically Signed   By: Davina Poke D.O.   On: 11/23/2020 11:16    CARDIAC EVENT MONITOR  Result Date: 12/04/2020 The predominant rhythm was normal sinus.  There was rare ectopy.  There was  one episode of what might have been atrial fib but this was with artifact and likely represented normal sinus.  There were no significant sustained arrhythmias.     DG Chest Port 1V same Day  Result Date: 11/17/2020 CLINICAL DATA:  87 year old male with history of cough. EXAM: PORTABLE CHEST 1 VIEW COMPARISON:  Chest x-ray 11/15/2020. FINDINGS: Lung volumes are low. No consolidative airspace disease. No pleural effusions. No pneumothorax. No pulmonary nodule or mass noted. Very large hiatal hernia. Pulmonary vasculature and the cardiomediastinal silhouette are otherwise within normal limits. Atherosclerosis in the thoracic aorta. Status post median sternotomy for CABG. IMPRESSION: 1. Low lung volumes without radiographic evidence of acute cardiopulmonary disease. 2. Very large hiatal hernia again noted. 3. Aortic atherosclerosis. Electronically Signed   By: Vinnie Langton M.D.   On: 11/17/2020 21:18   DG Swallowing Func-Speech Pathology  Result Date: 11/25/2020 Table formatting from the original result was not included. Objective Swallowing Evaluation: Type of Study: MBS-Modified Barium Swallow Study  Patient Details Name: Jon Reid MRN: [id number] Date of Birth: 10/19/1928 Today's Date: 11/25/2020 Time: SLP Start Time (ACUTE ONLY): 0315 -SLP Stop Time (ACUTE ONLY): 0335 SLP Time Calculation (min) (ACUTE ONLY): 20 min Past Medical History: Past Medical History: Diagnosis Date  Arthritis   Carcinoma of prostate ([place])   prostate  CKD (chronic kidney disease), stage III ([place])   Coronary artery disease   a. 10/2012 Cath: LM 20-30d, lAD 80p, 23m, 80-90d, LCX 90-21m, OM1 90, RCA 50p, 71m, 50d, RPDA 90, EF nl; b. 10/2012 CABG x 4 (LIMA-LAD, SVG-OM1, SVG-PDA->RPL); c. 07/2015 MV: Basal and proximal septal infarct w/o ischemia. EF 57%-->low risk.  First degree AV block 01/28/2019  Noted on EKG  GERD (gastroesophageal reflux disease)  Gout   HTN (hypertension)   Macrocytosis without anemia 01/08/2012  Moderate aortic  stenosis   a. 10/2012 Echo: EF 55-60%, no rwma, BAE, mild AI, mild TR; b. 06/2020 Echo: EF 50-55%, no rwma, mild LVH. Mod dil LA. Mild MR. Mod AS.  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/02/2013  Sleep apnea   Questionable  Spinal stenosis   Stroke [facility])  Past Surgical History: Past Surgical History: Procedure Laterality Date  BALLOON DILATION N/A 10/03/2020  Procedure: BALLOON DILATION;  Surgeon: [name], DO;  [facility];  Service: Endoscopy;  Laterality: N/A;  CARDIAC CATHETERIZATION  11/07/2012  Dr Acie Fredrickson  CATARACT EXTRACTION W/ INTRAOCULAR LENS IMPLANT Bilateral   CORONARY ARTERY BYPASS GRAFT N/A 11/11/2012  Procedure: CORONARY ARTERY BYPASS GRAFTING times four using Right Greater Saphenous Vein Graft harvested endoscopically and Left Internal Mammary Artery.;  Surgeon: Ivin Poot, MD;  Location: [place];  Service: Open Heart Surgery;  Laterality: N/A;  ESOPHAGEAL BRUSHING  10/03/2020  Procedure: ESOPHAGEAL BRUSHING;  Surgeon: [name], DO;  [facility];  Service: Endoscopy;;  ESOPHAGOGASTRODUODENOSCOPY (EGD) WITH PROPOFOL N/A 06/27/2020  Surgeon: [name], DO;  large hiatal hernia, LA grade D esophagitis with no bleeding, normal examined duodenum.   ESOPHAGOGASTRODUODENOSCOPY (EGD) WITH PROPOFOL N/A 10/03/2020  Procedure: ESOPHAGOGASTRODUODENOSCOPY (EGD) WITH PROPOFOL;  Surgeon: [name], DO;  [facility];  Service: Endoscopy;  Laterality: N/A;  2:00pm  HIP ARTHROPLASTY Right 11/13/2020  Procedure: ARTHROPLASTY BIPOLAR HIP (HEMIARTHROPLASTY);  Surgeon: Renette Butters, MD;  Location: [place];  Service: Orthopedics;  Laterality: Right;  INGUINAL HERNIA REPAIR Right 01/10/2017  Procedure: OPEN RIGHT HERNIA REPAIR INGUINAL;  Surgeon: Ileana Roup, MD;  Location: [facility];  Service: General;  Laterality: Right;  INSERTION OF MESH Right 01/10/2017  Procedure: INSERTION OF MESH;   Surgeon: Ileana Roup, MD;  Location: [facility];  Service: General;  Laterality: Right;  INTRAOPERATIVE TRANSESOPHAGEAL ECHOCARDIOGRAM N/A 11/11/2012  Procedure: INTRAOPERATIVE TRANSESOPHAGEAL ECHOCARDIOGRAM;  Surgeon: Ivin Poot, MD;  Location: [place];  Service: Open Heart Surgery;  Laterality: N/A;  LEFT HEART CATHETERIZATION WITH CORONARY ANGIOGRAM N/A 11/07/2012  Procedure: LEFT HEART CATHETERIZATION WITH CORONARY ANGIOGRAM;  Surgeon: Thayer Headings, MD;  Location: [facility] CATH [facility];  Service: Cardiovascular;  Laterality: N/A;  LUMBAR LAMINECTOMY/DECOMPRESSION MICRODISCECTOMY N/A 02/05/2019  Procedure: LUMBAR LAMINECTOMY/DECOMPRESSION L3-L4;  Surgeon: Latanya Maudlin, MD;  Location: [facility];  Service: Orthopedics;  Laterality: N/A;  52min  LYMPH NODE DISSECTION    Bilateral pelvic  RETROPUBIC PROSTATECTOMY    Radical HPI: Patient is a 87 y.o. male with PMH: GERD (EGD July 2022 showing large hiatal hernia, gastritis, non-severe candidiasis esophagitis with no bleeding), stage III chronic kidney disease, coronary artery disease with history of first-degree block, hypertension, history of prostate cancer. he presented to hospital after a fall just prior to presentation. CT head negative, hip xray shows fracture of right hip.  Subjective: pleasant, alert, good historian Assessment / Plan / Recommendation CHL IP CLINICAL IMPRESSIONS 11/25/2020 Clinical Impression Patient presents with a mild-moderate oropharyngeal dysphagia with likely esophageal component secondary to h/o large hiatal hernia. Oral phase consisted of delays in anterior to posterior transit with puree and mechanical soft solids as well as decreased bolus cohesion with barium tablet in thin liquid barium. No significant amount of residuals in oral cavity post initial swallows. During pharyngeal phase of swallow, patient exhibited trace penetration  during swallow(PAS 3) and one instance of silent aspiration (PAS 8) with thin liquid barium. Aspiration  occured during cued use of chin tuck posture but not when patient's head in neutral position. He exhibited mild-moderate vallecular and pyriform sinus residuals with puree and mechanical soft solids, and trace vallecular residuals and mild pyriform residuals with thin liquids. Although nectar thick liquids did not result in any aspiration or penetraiton, it did cause increased pharyngeal residuals. Patient exhibited presence of cervical osteophytes (no radiologist present to confirm) which appeared to be restricting esophageal opening. UES opening was reduced leading to pharyngeal residuals in pyriform sinuses and just above UES. SLP suspects that patient's dysphagia is likely chronic and may be exacerbated somewhat by his debility. SLP Visit Diagnosis -- Attention and concentration deficit following -- Frontal lobe and executive function deficit following -- Impact on safety and function --   CHL IP TREATMENT RECOMMENDATION 11/13/2020 Treatment Recommendations No treatment recommended at this time   Prognosis 11/25/2020 Prognosis for Safe Diet Advancement Good Barriers to Reach Goals -- Barriers/Prognosis Comment -- CHL IP DIET RECOMMENDATION 11/25/2020 SLP Diet Recommendations Regular solids;Thin liquid Liquid Administration via Straw;Cup Medication Administration Whole meds with puree Compensations Minimize environmental distractions;Slow rate;Small sips/bites Postural Changes Seated upright at 90 degrees;Remain semi-upright after after feeds/meals (Comment)   CHL IP OTHER RECOMMENDATIONS 11/25/2020 Recommended Consults -- Oral Care Recommendations Oral care BID Other Recommendations --   CHL IP FOLLOW UP RECOMMENDATIONS 11/13/2020 Follow up Recommendations None   No flowsheet data found.     CHL IP ORAL PHASE 11/25/2020 Oral Phase Impaired Oral - Pudding Teaspoon -- Oral - Pudding Cup -- Oral - Honey Teaspoon -- Oral - Honey Cup -- Oral - Nectar Teaspoon -- Oral - Nectar Cup WFL Oral - Nectar Straw -- Oral - Thin Teaspoon  -- Oral - Thin Cup WFL Oral - Thin Straw WFL Oral - Puree Weak lingual manipulation;Reduced posterior propulsion;Delayed oral transit Oral - Mech Soft Weak lingual manipulation;Reduced posterior propulsion;Delayed oral transit Oral - Regular -- Oral - Multi-Consistency -- Oral - Pill Delayed oral transit;Reduced posterior propulsion;Weak lingual manipulation;Decreased bolus cohesion Oral Phase - Comment --  CHL IP PHARYNGEAL PHASE 11/25/2020 Pharyngeal Phase Impaired Pharyngeal- Pudding Teaspoon -- Pharyngeal -- Pharyngeal- Pudding Cup -- Pharyngeal -- Pharyngeal- Honey Teaspoon -- Pharyngeal -- Pharyngeal- Honey Cup -- Pharyngeal -- Pharyngeal- Nectar Teaspoon -- Pharyngeal -- Pharyngeal- Nectar Cup -- Pharyngeal -- Pharyngeal- Nectar Straw -- Pharyngeal -- Pharyngeal- Thin Teaspoon -- Pharyngeal -- Pharyngeal- Thin Cup Pharyngeal residue - valleculae;Pharyngeal residue - pyriform;Penetration/Aspiration during swallow;Reduced airway/laryngeal closure Pharyngeal Material enters airway, passes BELOW cords without attempt by patient to eject out (silent aspiration) Pharyngeal- Thin Straw Reduced airway/laryngeal closure;Penetration/Aspiration during swallow;Pharyngeal residue - valleculae;Pharyngeal residue - pyriform Pharyngeal Material enters airway, remains ABOVE vocal cords and not ejected out Pharyngeal- Puree Pharyngeal residue - pyriform;Pharyngeal residue - valleculae;Lateral channel residue Pharyngeal -- Pharyngeal- Mechanical Soft Pharyngeal residue - valleculae;Pharyngeal residue - pyriform;Lateral channel residue Pharyngeal -- Pharyngeal- Regular -- Pharyngeal -- Pharyngeal- Multi-consistency -- Pharyngeal -- Pharyngeal- Pill WFL Pharyngeal -- Pharyngeal Comment --  CHL IP CERVICAL ESOPHAGEAL PHASE 11/25/2020 Cervical Esophageal Phase Impaired Pudding Teaspoon -- Pudding Cup -- Honey Teaspoon -- Honey Cup -- Nectar Teaspoon -- Nectar Cup Reduced cricopharyngeal relaxation Nectar Straw -- Thin Teaspoon -- Thin  Cup Reduced cricopharyngeal relaxation Thin Straw Reduced cricopharyngeal relaxation Puree Reduced cricopharyngeal relaxation Mechanical Soft Reduced cricopharyngeal relaxation Regular -- Multi-consistency -- Pill Reduced cricopharyngeal relaxation Cervical Esophageal Comment -- Sonia Baller, MA, CCC-SLP Speech  Therapy              DG HIP PORT UNILAT WITH PELVIS 1V RIGHT  Result Date: 12/04/2020 CLINICAL DATA:  Status post arthroplasty EXAM: DG HIP (WITH OR WITHOUT PELVIS) 1V PORT RIGHT COMPARISON:  November 11, 2020 FINDINGS: Patient is status post right hip replacement. The hardware is in good position. No other interval changes. IMPRESSION: Right hip arthroplasty placement as above. Hardware is in good position. Electronically Signed   By: Dorise Bullion III M.D.   On: 12/04/2020 15:57   VAS [name] LOWER EXTREMITY VENOUS (DVT)  Result Date: 11/19/2020  Lower Venous DVT Study Patient Name:  Jon Reid  Date of Exam:   11/19/2020 Medical Rec #: [id number]       Accession #:    [id number] Date of Birth: 1928-09-17       Patient Gender: M Patient Age:   64 years Exam Location:  [facility] Procedure:      VAS [name] LOWER EXTREMITY VENOUS (DVT) Referring Phys: Louisa Favaro --------------------------------------------------------------------------------  Indications: Fever. COVID positive.  Comparison Study: No prior study Performing Technologist: Maudry Mayhew MHA, RDMS, RVT, RDCS  Examination Guidelines: A complete evaluation includes B-mode imaging, spectral Doppler, color Doppler, and power Doppler as needed of all accessible portions of each vessel. Bilateral testing is considered an integral part of a complete examination. Limited examinations for reoccurring indications may be performed as noted. The reflux portion of the exam is performed with the patient in reverse Trendelenburg.  +---------+---------------+---------+-----------+----------+--------------+ RIGHT     CompressibilityPhasicitySpontaneityPropertiesThrombus Aging +---------+---------------+---------+-----------+----------+--------------+ CFV      Full           Yes      Yes                                 +---------+---------------+---------+-----------+----------+--------------+ SFJ      Full                                                        +---------+---------------+---------+-----------+----------+--------------+ FV Prox  Full                                                        +---------+---------------+---------+-----------+----------+--------------+ FV Mid   Full                                                        +---------+---------------+---------+-----------+----------+--------------+ FV DistalFull                                                        +---------+---------------+---------+-----------+----------+--------------+ PFV      Full                                                        +---------+---------------+---------+-----------+----------+--------------+  POP      Full           Yes      Yes                                 +---------+---------------+---------+-----------+----------+--------------+ PTV      Full                                                        +---------+---------------+---------+-----------+----------+--------------+ PERO     Full                                                        +---------+---------------+---------+-----------+----------+--------------+   +---------+---------------+---------+-----------+----------+--------------+ LEFT     CompressibilityPhasicitySpontaneityPropertiesThrombus Aging +---------+---------------+---------+-----------+----------+--------------+ CFV      Full           Yes      Yes                                 +---------+---------------+---------+-----------+----------+--------------+ SFJ      Full                                                         +---------+---------------+---------+-----------+----------+--------------+ FV Prox  Full                                                        +---------+---------------+---------+-----------+----------+--------------+ FV Mid   Full                                                        +---------+---------------+---------+-----------+----------+--------------+ FV DistalFull                                                        +---------+---------------+---------+-----------+----------+--------------+ PFV      Full                                                        +---------+---------------+---------+-----------+----------+--------------+ POP      Full           Yes      Yes                                 +---------+---------------+---------+-----------+----------+--------------+  PTV      Full                                                        +---------+---------------+---------+-----------+----------+--------------+ PERO     Full                                                        +---------+---------------+---------+-----------+----------+--------------+     Summary: BILATERAL: - No evidence of deep vein thrombosis seen in the lower extremities, bilaterally. -No evidence of popliteal cyst, bilaterally.   *See table(s) above for measurements and observations. Electronically signed by Deitra Mayo MD on 11/19/2020 at 3:03:10 PM.    Final     Labs:  Basic Metabolic Panel: BMP Latest Ref Rng & Units 12/05/2020 11/29/2020 11/28/2020  Glucose 70 - 99 mg/dL 97 107(H) 120(H)  BUN 8 - 23 mg/dL 15 14 15   Creatinine 0.61 - 1.24 mg/dL 1.16 1.21 1.30(H)  BUN/Creat Ratio 10 - 24 - - -  Sodium 135 - 145 mmol/L 133(L) 135 136  Potassium 3.5 - 5.1 mmol/L 4.1 4.2 4.2  Chloride 98 - 111 mmol/L 103 104 109  CO2 22 - 32 mmol/L 22 23 21(L)  Calcium 8.9 - 10.3 mg/dL 8.3(L) 8.2(L) 8.1(L)     CBC: CBC Latest Ref Rng & Units 11/29/2020  11/28/2020 11/26/2020  WBC 4.0 - 10.5 K/uL 9.5 11.5(H) 7.6  Hemoglobin 13.0 - 17.0 g/dL 9.0(L) 8.5(L) 8.1(L)  Hematocrit 39.0 - 52.0 % 28.4(L) 26.1(L) 24.6(L)  Platelets 150 - 400 K/uL 497(H) 465(H) 389     CBG: No results for input(s): GLUCAP in the last 168 hours.  Brief HPI:   Jon Reid is a 87 y.o. male with history of prostate cancer, CKD, CAD, Parkinson's disease, spinal stenosis who was admitted on 11/11/2020 after a fall onto right hip with subsequent right subcapital femur fracture.  He was also found to be SARS Cov-2 positive without symptoms but this was felt to be older infection therefore isolation was not felt to be needed.  He underwent right hip hemiarthroplasty the same day by Dr. Percell Miller and postop is weightbearing as tolerated.  He has had issues with confusion as well as lethargy, acute blood loss anemia, shortness of breath as well as reactive leukocytosis without symptoms.  He did have coughing episode with reports of aspiration and chest x-ray showed minimal left basilar atelectasis.  WBC was trending down therefore he was monitored without treatment.  Therapy was ongoing and patient was limited by confusion with mumbling speech at times, shuffling gait with intermittent freezing episodes, posterior lean as well as difficulty maintaining hip precautions.  CIR was recommended due to functional decline.   Hospital Course: Jon Reid was admitted to rehab 11/17/2020 for inpatient therapies to consist of PT, ST and OT at least three hours five days a week. Past admission physiatrist, therapy team and rehab RN have worked together to provide customized collaborative inpatient rehab.  He was found to have ongoing issues with coughing as well as hypoxia therefore was placed on COVID precautions due to concern of reinfection.  Chest x-ray done showed atelectasis without acute infection.  He did develop confusion as well as upward trend in white count and worsening of cough felt to be  due to issues with intermittent aspiration.  He was started on IV Vanco and Zosyn X 7 days followed by augmentin to complete 10 day course of treatment. He has had improvement in white count as well as respiratory status and mentation with treatment of aspiration PNA.   Speech therapy was consulted for input and he was found to have mild to moderate oropharyngeal dysphagia on modified barium swallow felt to be due to esophageal component.  BLE Dopplers at admission were negative for DVT and he was started on subcu Lovenox for DVT prophylaxis.  This was however discontinued later due to drop in H&H and he was maintained on aspirin for DVT prophylaxis. He was maintained on regular diet but  aspiration precautions added with intermittent supervision for safety with swallow.  He continues on Protonix twice daily to manage GERD.  Lasix was placed on hold due to issues with intermittent hypotension. Serial check of electrolytes showed hypokalemia which was supplemented with improvement.  Constipation has resolved with augmentation of bowel regimen.  He also developed AKI and was encouraged to increase fluid intake with resolution of prerenal acidemia.  Chronic cough has improved with use of guaifenesin.  Trazodone was added to help with sleep-wake disruption however was discontinued due to confusion side effects.  Low-dose melatonin has been effective in helping with sleep disruption.  Follow-up CBC shows H&H to be improving.  His hip incision is healing well without any signs or symptoms of infection.  He has made gains during his rehab stay but continues to have difficulty recalling hip precautions with ADLs.  He requires contact-guard assist to supervision at discharge.  He will continue to receive follow-up   Rehab course: During patient's stay in rehab weekly team conferences were held to monitor patient's progress, set goals and discuss barriers to discharge. At admission, patient required max assist with  basic self-care task and with mobility. He  has had improvement in activity tolerance, balance, postural control as well as ability to compensate for deficits.  He requires min to mod assist with ADL tasks.  He requires contact-guard assist for transfers and to ambulate 150 feet with rolling walker.  Family education has been completed regarding safety and assistance needed.   Discharge disposition: 01-Home or Self Care  Diet: Regular diet  Special Instructions: Needs to be upright for meals. And  to continue with aspiration precautions, Continue hip precautions till cleared by Dr. Percell Miller.  Allergies as of 12/08/2020       Reactions   Crestor [rosuvastatin] Swelling   GYNECOMASTIA    Trazodone And Nefazodone    Hallucinations        Medication List     STOP taking these medications    dextromethorphan-guaiFENesin 30-600 MG 12hr tablet Commonly known as: MUCINEX DM Replaced by: SM Tussin DM 10-100 MG/5ML liquid   feeding supplement Liqd   furosemide 40 MG tablet Commonly known as: LASIX   zinc sulfate 220 (50 Zn) MG capsule       TAKE these medications    ascorbic acid 500 MG tablet Commonly known as: VITAMIN C Take 1 tablet (500 mg total) by mouth daily.   Aspirin Low Dose 81 MG EC tablet Generic drug: aspirin Take 1 tablet (81 mg total) by mouth daily. Swallow whole. What changed: See the new instructions. Notes to patient: As blood thinner   carbidopa-levodopa 25-100 MG  tablet Commonly known as: SINEMET IR Take 1.5 tablets by mouth 3 (three) times daily.   CertaVite/Antioxidants Tabs Take 1 tablet by mouth daily.   docusate sodium 100 MG capsule Commonly known as: COLACE Take 1 capsule (100 mg total) by mouth 2 (two) times daily.   fluticasone 50 MCG/ACT nasal spray Commonly known as: FLONASE One to 2 sprays each nostril at bedtime What changed:  how much to take how to take this when to take this additional instructions   Iron (Ferrous  Sulfate) 325 (65 Fe) MG Tabs Take 1 tablet by mouth daily. What changed: how much to take   melatonin 5 MG Tabs Take 1 tablet (5 mg total) by mouth at bedtime.   nitroGLYCERIN 0.4 MG SL tablet Commonly known as: NITROSTAT Place 1 tablet (0.4 mg total) under the tongue every 5 (five) minutes as needed for chest pain.   pantoprazole 40 MG tablet Commonly known as: PROTONIX Take 1 tablet (40 mg total) by mouth 2 (two) times daily.   Senexon-S 8.6-50 MG tablet Generic drug: senna-docusate Take 2 tablets by mouth daily with supper.   SM Tussin DM 10-100 MG/5ML liquid Generic drug: Dextromethorphan-guaiFENesin Take 5 mLs by mouth every 6 (six) hours. Replaces: dextromethorphan-guaiFENesin 30-600 MG 12hr tablet   triamcinolone cream 0.1 % Commonly known as: KENALOG Apply 1 application topically daily as needed (itching).   Vitamin D 50 MCG (2000 UT) Caps Take 2,000 Units by mouth daily.        Follow-up Information     Raulkar, Clide Deutscher, MD Follow up.   Specialty: Physical Medicine and Rehabilitation Why: 12/22/20 please arrive at 10:20am for 10:40am appointment, thank you! Contact information: [address] [place] [address] [phone]         Renette Butters, MD. Call on 12/09/2020.   Specialty: Orthopedic Surgery Why: for post hospital follow up Contact information: [address] [place] [address] [phone]         Claretta Fraise, MD. Call on 12/09/2020.   Specialty: Family Medicine Why: for post hospital follow up Contact information: [place] [address] [phone]                 Signed: Bary Leriche 12/12/2020, 1:27 AM

## 2020-12-09 ENCOUNTER — Telehealth: Payer: Self-pay

## 2020-12-09 DIAGNOSIS — M1990 Unspecified osteoarthritis, unspecified site: Secondary | ICD-10-CM | POA: Diagnosis not present

## 2020-12-09 DIAGNOSIS — N179 Acute kidney failure, unspecified: Secondary | ICD-10-CM | POA: Diagnosis not present

## 2020-12-09 DIAGNOSIS — R1312 Dysphagia, oropharyngeal phase: Secondary | ICD-10-CM | POA: Diagnosis not present

## 2020-12-09 DIAGNOSIS — I2510 Atherosclerotic heart disease of native coronary artery without angina pectoris: Secondary | ICD-10-CM | POA: Diagnosis not present

## 2020-12-09 DIAGNOSIS — K219 Gastro-esophageal reflux disease without esophagitis: Secondary | ICD-10-CM | POA: Diagnosis not present

## 2020-12-09 DIAGNOSIS — F482 Pseudobulbar affect: Secondary | ICD-10-CM | POA: Diagnosis not present

## 2020-12-09 DIAGNOSIS — J189 Pneumonia, unspecified organism: Secondary | ICD-10-CM | POA: Diagnosis not present

## 2020-12-09 DIAGNOSIS — S72011D Unspecified intracapsular fracture of right femur, subsequent encounter for closed fracture with routine healing: Secondary | ICD-10-CM | POA: Diagnosis not present

## 2020-12-09 DIAGNOSIS — G20 Parkinson's disease: Secondary | ICD-10-CM | POA: Diagnosis not present

## 2020-12-09 DIAGNOSIS — E876 Hypokalemia: Secondary | ICD-10-CM | POA: Diagnosis not present

## 2020-12-09 DIAGNOSIS — Z9181 History of falling: Secondary | ICD-10-CM | POA: Diagnosis not present

## 2020-12-09 DIAGNOSIS — W19XXXD Unspecified fall, subsequent encounter: Secondary | ICD-10-CM | POA: Diagnosis not present

## 2020-12-09 DIAGNOSIS — I129 Hypertensive chronic kidney disease with stage 1 through stage 4 chronic kidney disease, or unspecified chronic kidney disease: Secondary | ICD-10-CM | POA: Diagnosis not present

## 2020-12-09 DIAGNOSIS — N189 Chronic kidney disease, unspecified: Secondary | ICD-10-CM | POA: Diagnosis not present

## 2020-12-09 DIAGNOSIS — Z96641 Presence of right artificial hip joint: Secondary | ICD-10-CM | POA: Diagnosis not present

## 2020-12-09 DIAGNOSIS — D62 Acute posthemorrhagic anemia: Secondary | ICD-10-CM | POA: Diagnosis not present

## 2020-12-09 DIAGNOSIS — Z8616 Personal history of COVID-19: Secondary | ICD-10-CM | POA: Diagnosis not present

## 2020-12-09 DIAGNOSIS — E43 Unspecified severe protein-calorie malnutrition: Secondary | ICD-10-CM | POA: Diagnosis not present

## 2020-12-09 DIAGNOSIS — M1030 Gout due to renal impairment, unspecified site: Secondary | ICD-10-CM | POA: Diagnosis not present

## 2020-12-09 DIAGNOSIS — Z8673 Personal history of transient ischemic attack (TIA), and cerebral infarction without residual deficits: Secondary | ICD-10-CM | POA: Diagnosis not present

## 2020-12-09 DIAGNOSIS — Z7982 Long term (current) use of aspirin: Secondary | ICD-10-CM | POA: Diagnosis not present

## 2020-12-09 DIAGNOSIS — C61 Malignant neoplasm of prostate: Secondary | ICD-10-CM | POA: Diagnosis not present

## 2020-12-09 DIAGNOSIS — Z951 Presence of aortocoronary bypass graft: Secondary | ICD-10-CM | POA: Diagnosis not present

## 2020-12-09 DIAGNOSIS — I350 Nonrheumatic aortic (valve) stenosis: Secondary | ICD-10-CM | POA: Diagnosis not present

## 2020-12-09 DIAGNOSIS — M4800 Spinal stenosis, site unspecified: Secondary | ICD-10-CM | POA: Diagnosis not present

## 2020-12-09 NOTE — Telephone Encounter (Signed)
Transition Care Management Follow-up Telephone Call Date of discharge and from where: 12/08/2020 - [facility] Diagnosis:  fall, right femur fracture s/p hip surgery How have you been since you were released from the hospital? better Any questions or concerns? No  Items Reviewed: Did the pt receive and understand the discharge instructions provided? Yes  Medications obtained and verified? Yes  Other? No  Any new allergies since your discharge? No  Dietary orders reviewed? Yes Do you have support at home? Yes   Home Care and Equipment/Supplies: Were home health services ordered? yes If so, what is the name of the agency? [facility]  Has the agency set up a time to come to the patient's home? yes Were any new equipment or medical supplies ordered?  No What is the name of the medical supply agency? N/a Were you able to get the supplies/equipment? not applicable Do you have any questions related to the use of the equipment or supplies? No  Functional Questionnaire: (I = Independent and D = Dependent) ADLs: D - wife and son help  Bathing/Dressing- I with assistance  Meal Prep- D  Eating- I  with assistance  Maintaining continence- I - sometimes  Transferring/Ambulation- I - with walker  has a hard time  Managing Meds- D  Follow up appointments reviewed:  PCP Hospital f/u appt confirmed? Yes  Scheduled to see Stacks on 12/14/20 @ 1:40. [facility] f/u appt confirmed? Yes  Scheduled to see Hochrein on 12/19/20  Are transportation arrangements needed? No  If their condition worsens, is the pt aware to call PCP or go to the Emergency Dept.? Yes Was the patient provided with contact information for the PCP's office or ED? Yes Was to pt encouraged to call back with questions or concerns? Yes

## 2020-12-09 NOTE — Telephone Encounter (Signed)
[facility] hospital follow up apt--pt was admitted for a month. Please call back

## 2020-12-12 DIAGNOSIS — G20 Parkinson's disease: Secondary | ICD-10-CM | POA: Diagnosis not present

## 2020-12-12 DIAGNOSIS — D62 Acute posthemorrhagic anemia: Secondary | ICD-10-CM | POA: Diagnosis not present

## 2020-12-12 DIAGNOSIS — J189 Pneumonia, unspecified organism: Secondary | ICD-10-CM | POA: Diagnosis not present

## 2020-12-12 DIAGNOSIS — S72011D Unspecified intracapsular fracture of right femur, subsequent encounter for closed fracture with routine healing: Secondary | ICD-10-CM | POA: Diagnosis not present

## 2020-12-12 DIAGNOSIS — I129 Hypertensive chronic kidney disease with stage 1 through stage 4 chronic kidney disease, or unspecified chronic kidney disease: Secondary | ICD-10-CM | POA: Diagnosis not present

## 2020-12-12 DIAGNOSIS — R059 Cough, unspecified: Secondary | ICD-10-CM

## 2020-12-12 DIAGNOSIS — N179 Acute kidney failure, unspecified: Secondary | ICD-10-CM | POA: Diagnosis not present

## 2020-12-13 DIAGNOSIS — N179 Acute kidney failure, unspecified: Secondary | ICD-10-CM | POA: Diagnosis not present

## 2020-12-13 DIAGNOSIS — G20 Parkinson's disease: Secondary | ICD-10-CM | POA: Diagnosis not present

## 2020-12-13 DIAGNOSIS — S72011D Unspecified intracapsular fracture of right femur, subsequent encounter for closed fracture with routine healing: Secondary | ICD-10-CM | POA: Diagnosis not present

## 2020-12-13 DIAGNOSIS — J189 Pneumonia, unspecified organism: Secondary | ICD-10-CM | POA: Diagnosis not present

## 2020-12-13 DIAGNOSIS — D62 Acute posthemorrhagic anemia: Secondary | ICD-10-CM | POA: Diagnosis not present

## 2020-12-13 DIAGNOSIS — I129 Hypertensive chronic kidney disease with stage 1 through stage 4 chronic kidney disease, or unspecified chronic kidney disease: Secondary | ICD-10-CM | POA: Diagnosis not present

## 2020-12-14 ENCOUNTER — Other Ambulatory Visit: Payer: Self-pay

## 2020-12-14 ENCOUNTER — Ambulatory Visit (INDEPENDENT_AMBULATORY_CARE_PROVIDER_SITE_OTHER): Payer: MEDICARE | Admitting: Family Medicine

## 2020-12-14 ENCOUNTER — Encounter: Payer: Self-pay | Admitting: Family Medicine

## 2020-12-14 VITALS — BP 124/56 | HR 81 | Temp 97.7°F | Ht 72.0 in | Wt 154.6 lb

## 2020-12-14 DIAGNOSIS — E43 Unspecified severe protein-calorie malnutrition: Secondary | ICD-10-CM

## 2020-12-14 DIAGNOSIS — N179 Acute kidney failure, unspecified: Secondary | ICD-10-CM | POA: Diagnosis not present

## 2020-12-14 DIAGNOSIS — M25562 Pain in left knee: Secondary | ICD-10-CM

## 2020-12-14 DIAGNOSIS — S72001A Fracture of unspecified part of neck of right femur, initial encounter for closed fracture: Secondary | ICD-10-CM | POA: Diagnosis not present

## 2020-12-14 DIAGNOSIS — M25561 Pain in right knee: Secondary | ICD-10-CM | POA: Diagnosis not present

## 2020-12-14 DIAGNOSIS — R54 Age-related physical debility: Secondary | ICD-10-CM

## 2020-12-14 DIAGNOSIS — D500 Iron deficiency anemia secondary to blood loss (chronic): Secondary | ICD-10-CM

## 2020-12-14 DIAGNOSIS — N1832 Chronic kidney disease, stage 3b: Secondary | ICD-10-CM

## 2020-12-14 DIAGNOSIS — G8929 Other chronic pain: Secondary | ICD-10-CM | POA: Diagnosis not present

## 2020-12-14 DIAGNOSIS — S72001D Fracture of unspecified part of neck of right femur, subsequent encounter for closed fracture with routine healing: Secondary | ICD-10-CM

## 2020-12-14 DIAGNOSIS — L89159 Pressure ulcer of sacral region, unspecified stage: Secondary | ICD-10-CM | POA: Diagnosis not present

## 2020-12-14 MED ORDER — IRON (FERROUS SULFATE) 325 (65 FE) MG PO TABS: 1.0000 | ORAL_TABLET | Freq: Every day | ORAL | 3 refills | Status: AC

## 2020-12-14 NOTE — Progress Notes (Signed)
Subjective:  Patient ID: Jon Reid, male    DOB: Sep 25, 1928  Age: 87 y.o. MRN: [id number]  CC: Transitions Of Care   HPI [name] presents for hospital follow up of stay from 8/26 to 11/17/20 for acute care and from 11/17/20 to 12/08/20 for rehab due to right femurfracture with hemiarthroplasty. He was also noted to have severe malnutrition, acute renal insufficiency, pressure wound to skin and acute on chronic anemia.  Depression screen [facility] 2/9 12/14/2020 11/08/2020 09/23/2020  Decreased Interest 0 0 0  Down, Depressed, Hopeless 0 0 -  PHQ - 2 Score 0 0 0  Altered sleeping - 0 -  Tired, decreased energy - 3 -  Change in appetite - 0 -  Feeling bad or failure about yourself  - 0 -  Trouble concentrating - 3 -  Moving slowly or fidgety/restless - 3 -  Suicidal thoughts - 0 -  PHQ-9 Score - 9 -  Difficult doing work/chores - Very difficult -  Some recent data might be hidden    History Chevon has a past medical history of Arthritis, Carcinoma of prostate ([place]), CKD (chronic kidney disease), stage III ([place]), Coronary artery disease, First degree AV block (01/28/2019), GERD (gastroesophageal reflux disease), Gout, HTN (hypertension), Macrocytosis without anemia (01/08/2012), Moderate aortic stenosis, [name] affect (02/09/2020), 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   He has a past surgical history that includes Retropubic prostatectomy; Lymph node dissection; Cardiac catheterization (11/07/2012); Coronary artery bypass graft (N/A, 11/11/2012); Intraoprative transesophageal echocardiogram (N/A, 11/11/2012); left heart catheterization with coronary angiogram (N/A, 11/07/2012); Inguinal hernia repair (Right, 01/10/2017); Insertion of mesh (Right, 01/10/2017); Cataract extraction w/ intraocular lens implant (Bilateral); Lumbar laminectomy/decompression microdiscectomy (N/A, 02/05/2019);  Esophagogastroduodenoscopy (egd) with propofol (N/A, 06/27/2020); Esophagogastroduodenoscopy (egd) with propofol (N/A, 10/03/2020); Balloon dilation (N/A, 10/03/2020); Esophageal brushing (10/03/2020); and Hip Arthroplasty (Right, 11/13/2020).   His family history includes Aneurysm in his father; Cancer in his mother; Hypertension in his father; Parkinson's disease in his son; Stroke in his father.He reports that he has never smoked. He has never used smokeless tobacco. He reports current alcohol use. He reports that he does not use drugs.    ROS Review of Systems  Constitutional:  Negative for fever.  Respiratory:  Negative for shortness of breath.   Cardiovascular:  Negative for chest pain.  Musculoskeletal:  Negative for arthralgias.  Skin:  Negative for rash.   Objective:  BP (!) 124/56   Pulse 81   Temp 97.7 F (36.5 C)   Ht 6' (1.829 m)   Wt 154 lb 9.6 oz (70.1 kg)   SpO2 99%   BMI 20.97 kg/m   BP Readings from Last 3 Encounters:  12/14/20 (!) 124/56  12/08/20 134/66  11/17/20 (!) 102/49    Wt Readings from Last 3 Encounters:  12/14/20 154 lb 9.6 oz (70.1 kg)  12/02/20 153 lb 7 oz (69.6 kg)  11/12/20 151 lb 10.8 oz (68.8 kg)     Physical Exam Vitals reviewed.  Constitutional:      Appearance: He is well-developed.  HENT:     Head: Normocephalic and atraumatic.     Right Ear: External ear normal.     Left Ear: External ear normal.     Mouth/Throat:     Pharynx: No oropharyngeal exudate or posterior oropharyngeal erythema.  Eyes:     Pupils: Pupils are equal, round, and reactive to light.  Cardiovascular:  Rate and Rhythm: Normal rate and regular rhythm.     Heart sounds: No murmur heard. Pulmonary:     Effort: No respiratory distress.     Breath sounds: Normal breath sounds.  Musculoskeletal:     Cervical back: Normal range of motion and neck supple.  Neurological:     Mental Status: He is alert and oriented to person, place, and time.       Assessment & Plan:   Vishnu was seen today for transitions of care.  Diagnoses and all orders for this visit:  Frail elderly -     For home use only DME Hospital bed  Closed right hip fracture, initial encounter ([place]) -     For home use only DME Hospital bed  Pressure injury of skin of sacral region, unspecified injury stage -     For home use only DME Hospital bed  AKI (acute kidney injury) ([place]) -     CBC with Differential/Platelet -     CMP14+EGFR  Chronic pain of both knees -     CBC with Differential/Platelet -     CMP14+EGFR  Chronic renal impairment, stage 3b (HCC) -     CBC with Differential/Platelet -     CMP14+EGFR  Closed fracture of neck of right femur with routine healing, subsequent encounter  Severe protein-calorie malnutrition (HCC) -     Prealbumin  Iron deficiency anemia due to chronic blood loss -     Iron, Ferrous Sulfate, 325 (65 Fe) MG TABS; Take 1 tablet by mouth daily.      I am having Barnabas Lister A. Santana maintain his fluticasone, nitroGLYCERIN, Vitamin D, ascorbic acid, triamcinolone cream, carbidopa-levodopa, Dextromethorphan-guaiFENesin, docusate sodium, melatonin, pantoprazole, senna-docusate, CertaVite/Antioxidants, aspirin, and Iron (Ferrous Sulfate).  Allergies as of 12/14/2020       Reactions   Crestor [rosuvastatin] Swelling   GYNECOMASTIA    Trazodone And Nefazodone    Hallucinations        Medication List        Accurate as of December 14, 2020  9:50 PM. If you have any questions, ask your nurse or doctor.          ascorbic acid 500 MG tablet Commonly known as: VITAMIN C Take 1 tablet (500 mg total) by mouth daily.   Aspirin Low Dose 81 MG EC tablet Generic drug: aspirin Take 1 tablet (81 mg total) by mouth daily. Swallow whole.   carbidopa-levodopa 25-100 MG tablet Commonly known as: SINEMET IR Take 1.5 tablets by mouth 3 (three) times daily.   CertaVite/Antioxidants Tabs Take 1 tablet by mouth daily.    docusate sodium 100 MG capsule Commonly known as: COLACE Take 1 capsule (100 mg total) by mouth 2 (two) times daily.   fluticasone 50 MCG/ACT nasal spray Commonly known as: FLONASE One to 2 sprays each nostril at bedtime What changed:  how much to take how to take this when to take this additional instructions   Iron (Ferrous Sulfate) 325 (65 Fe) MG Tabs Take 1 tablet by mouth daily. What changed: how much to take   melatonin 5 MG Tabs Take 1 tablet (5 mg total) by mouth at bedtime.   nitroGLYCERIN 0.4 MG SL tablet Commonly known as: NITROSTAT Place 1 tablet (0.4 mg total) under the tongue every 5 (five) minutes as needed for chest pain.   pantoprazole 40 MG tablet Commonly known as: PROTONIX Take 1 tablet (40 mg total) by mouth 2 (two) times daily.   Senexon-S 8.6-50 MG  tablet Generic drug: senna-docusate Take 2 tablets by mouth daily with supper.   SM Tussin DM 10-100 MG/5ML liquid Generic drug: Dextromethorphan-guaiFENesin Take 5 mLs by mouth every 6 (six) hours.   triamcinolone cream 0.1 % Commonly known as: KENALOG Apply 1 application topically daily as needed (itching).   Vitamin D 50 MCG (2000 UT) Caps Take 2,000 Units by mouth daily.               Durable Medical Equipment  (From admission, onward)           Start     Ordered   12/14/20 0000  For home use only DME Hospital bed       Question Answer Comment  Length of Need 12 Months   Patient has (list medical condition): right hip fracture, arthritis of knees, pressure sore   The above medical condition requires: Patient requires the ability to reposition frequently   Bed type Semi-electric   Support Surface: Gel Overlay      12/14/20 1423             Follow-up: Return in about 1 month (around 01/13/2021).  Claretta Fraise, M.D.

## 2020-12-15 DIAGNOSIS — S72011D Unspecified intracapsular fracture of right femur, subsequent encounter for closed fracture with routine healing: Secondary | ICD-10-CM | POA: Diagnosis not present

## 2020-12-15 DIAGNOSIS — G20 Parkinson's disease: Secondary | ICD-10-CM | POA: Diagnosis not present

## 2020-12-15 DIAGNOSIS — N179 Acute kidney failure, unspecified: Secondary | ICD-10-CM | POA: Diagnosis not present

## 2020-12-15 DIAGNOSIS — D62 Acute posthemorrhagic anemia: Secondary | ICD-10-CM | POA: Diagnosis not present

## 2020-12-15 DIAGNOSIS — I129 Hypertensive chronic kidney disease with stage 1 through stage 4 chronic kidney disease, or unspecified chronic kidney disease: Secondary | ICD-10-CM | POA: Diagnosis not present

## 2020-12-15 DIAGNOSIS — J189 Pneumonia, unspecified organism: Secondary | ICD-10-CM | POA: Diagnosis not present

## 2020-12-15 LAB — CBC WITH DIFFERENTIAL/PLATELET
Basophils Absolute: 0.1 10*3/uL (ref 0.0–0.2)
Basos: 1 %
EOS (ABSOLUTE): 0.2 10*3/uL (ref 0.0–0.4)
Eos: 2 %
Hematocrit: 30.7 % — ABNORMAL LOW (ref 37.5–51.0)
Hemoglobin: 10 g/dL — ABNORMAL LOW (ref 13.0–17.7)
Immature Grans (Abs): 0 10*3/uL (ref 0.0–0.1)
Immature Granulocytes: 0 %
Lymphocytes Absolute: 1.8 10*3/uL (ref 0.7–3.1)
Lymphs: 17 %
MCH: 29.4 pg (ref 26.6–33.0)
MCHC: 32.6 g/dL (ref 31.5–35.7)
MCV: 90 fL (ref 79–97)
Monocytes Absolute: 0.9 10*3/uL (ref 0.1–0.9)
Monocytes: 9 %
Neutrophils Absolute: 7.3 10*3/uL — ABNORMAL HIGH (ref 1.4–7.0)
Neutrophils: 71 %
Platelets: 447 10*3/uL (ref 150–450)
RBC: 3.4 x10E6/uL — ABNORMAL LOW (ref 4.14–5.80)
RDW: 14.5 % (ref 11.6–15.4)
WBC: 10.3 10*3/uL (ref 3.4–10.8)

## 2020-12-15 LAB — CMP14+EGFR
ALT: 13 IU/L (ref 0–44)
AST: 22 IU/L (ref 0–40)
Albumin/Globulin Ratio: 1.3 (ref 1.2–2.2)
Albumin: 3.9 g/dL (ref 3.5–4.6)
Alkaline Phosphatase: 141 IU/L — ABNORMAL HIGH (ref 44–121)
BUN/Creatinine Ratio: 15 (ref 10–24)
BUN: 18 mg/dL (ref 10–36)
Bilirubin Total: 0.4 mg/dL (ref 0.0–1.2)
CO2: 21 mmol/L (ref 20–29)
Calcium: 8.9 mg/dL (ref 8.6–10.2)
Chloride: 103 mmol/L (ref 96–106)
Creatinine, Ser: 1.2 mg/dL (ref 0.76–1.27)
Globulin, Total: 2.9 g/dL (ref 1.5–4.5)
Glucose: 115 mg/dL — ABNORMAL HIGH (ref 70–99)
Potassium: 4.9 mmol/L (ref 3.5–5.2)
Sodium: 139 mmol/L (ref 134–144)
Total Protein: 6.8 g/dL (ref 6.0–8.5)
eGFR: 57 mL/min/{1.73_m2} — ABNORMAL LOW (ref >59)

## 2020-12-15 LAB — PREALBUMIN: PREALBUMIN: 12 mg/dL (ref 9–32)

## 2020-12-19 DIAGNOSIS — N179 Acute kidney failure, unspecified: Secondary | ICD-10-CM | POA: Diagnosis not present

## 2020-12-19 DIAGNOSIS — S72011D Unspecified intracapsular fracture of right femur, subsequent encounter for closed fracture with routine healing: Secondary | ICD-10-CM | POA: Diagnosis not present

## 2020-12-19 DIAGNOSIS — G20 Parkinson's disease: Secondary | ICD-10-CM | POA: Diagnosis not present

## 2020-12-19 DIAGNOSIS — I129 Hypertensive chronic kidney disease with stage 1 through stage 4 chronic kidney disease, or unspecified chronic kidney disease: Secondary | ICD-10-CM | POA: Diagnosis not present

## 2020-12-19 DIAGNOSIS — D62 Acute posthemorrhagic anemia: Secondary | ICD-10-CM | POA: Diagnosis not present

## 2020-12-19 DIAGNOSIS — J189 Pneumonia, unspecified organism: Secondary | ICD-10-CM | POA: Diagnosis not present

## 2020-12-20 DIAGNOSIS — D62 Acute posthemorrhagic anemia: Secondary | ICD-10-CM | POA: Diagnosis not present

## 2020-12-20 DIAGNOSIS — J189 Pneumonia, unspecified organism: Secondary | ICD-10-CM | POA: Diagnosis not present

## 2020-12-20 DIAGNOSIS — I129 Hypertensive chronic kidney disease with stage 1 through stage 4 chronic kidney disease, or unspecified chronic kidney disease: Secondary | ICD-10-CM | POA: Diagnosis not present

## 2020-12-20 DIAGNOSIS — I452 Bifascicular block: Secondary | ICD-10-CM | POA: Insufficient documentation

## 2020-12-20 DIAGNOSIS — S72011D Unspecified intracapsular fracture of right femur, subsequent encounter for closed fracture with routine healing: Secondary | ICD-10-CM | POA: Diagnosis not present

## 2020-12-20 DIAGNOSIS — N179 Acute kidney failure, unspecified: Secondary | ICD-10-CM | POA: Diagnosis not present

## 2020-12-20 DIAGNOSIS — G20 Parkinson's disease: Secondary | ICD-10-CM | POA: Diagnosis not present

## 2020-12-20 NOTE — Progress Notes (Signed)
Cardiology Office Note   Date:  12/21/2020   ID:  Jon Reid, DOB [birth year], MRN [id number]  PCP:  Jon Fraise, MD  Cardiologist:   Jon Breeding, MD  Referring:  Jon Fraise, MD  Chief Complaint  Patient presents with   Coronary Artery Disease   Fatigue        History of Present Illness: Jon Reid is a 87 y.o. male who presents for follow up of CAD.  He has CABG in 2014.  In 2017 he had a negative stress perfusion study.  Since I last saw him he was in the hospital with the patient was in the hospital in April with COVID-19 pneumonia and GI bleed.  Transthoracic echo was done at that time with EF of 50 to 55% with moderate AS.  He did have troponin elevation but was not thought to be a Candidate.  He was to be considered for perfusion study.  However, at the follow-up visit in May it was decided to manage this more conservatively as he was still recovering from his illness.  He returns for follow-up   Since I last saw him he was in the hospital with a broken hip.  He has not had syncope but has had falls.  I checked a monitor and he had no significant arrhythmias.    I reviewed these records.  There was no apparent question of syncope.  He has been walking with a walker and very weak.  However, he is avoiding falls.  He has not had any presyncope or syncope.  He does not notice any palpitations.  He is not describing chest pressure, neck or arm discomfort.  He describes dizziness when he looks up or tries to going to reach for something and to get out and that is when he might have his falls.    Past Medical History:  Diagnosis Date   Arthritis    Carcinoma of prostate ([place])    prostate   CKD (chronic kidney disease), stage III (HCC)    Coronary artery disease    a. 10/2012 Cath: LM 20-30d, lAD 80p, 26m, 80-90d, LCX 90-31m, OM1 90, RCA 50p, 13m, 50d, RPDA 90, EF nl; b. 10/2012 CABG x 4 (LIMA-LAD, SVG-OM1, SVG-PDA->RPL); c. 07/2015 MV: Basal and proximal septal  infarct w/o ischemia. EF 57%-->low risk.   First degree AV block 01/28/2019   Noted on EKG   GERD (gastroesophageal reflux disease)    Gout    HTN (hypertension)    Macrocytosis without anemia 01/08/2012   Moderate aortic stenosis    a. 10/2012 Echo: EF 55-60%, no rwma, BAE, mild AI, mild TR; b. 06/2020 Echo: EF 50-55%, no rwma, mild LVH. Mod dil LA. Mild MR. Mod AS.   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   Sleep apnea    Questionable   Spinal stenosis    Stroke [facility])     Past Surgical History:  Procedure Laterality Date   BALLOON DILATION N/A 10/03/2020   Procedure: BALLOON DILATION;  Surgeon: [name], DO;  [facility];  Service: Endoscopy;  Laterality: N/A;   CARDIAC CATHETERIZATION  11/07/2012   Dr Acie Fredrickson   CATARACT EXTRACTION W/ INTRAOCULAR LENS IMPLANT Bilateral    CORONARY ARTERY BYPASS GRAFT N/A 11/11/2012   Procedure: CORONARY ARTERY BYPASS GRAFTING times four using Right Greater Saphenous Vein Graft harvested endoscopically and Left Internal  Mammary Artery.;  Surgeon: Ivin Poot, MD;  Location: [place] [facility];  Service: Open Heart Surgery;  Laterality: N/A;   ESOPHAGEAL BRUSHING  10/03/2020   Procedure: ESOPHAGEAL BRUSHING;  Surgeon: [name], DO;  [facility];  Service: Endoscopy;;   ESOPHAGOGASTRODUODENOSCOPY (EGD) WITH PROPOFOL N/A 06/27/2020   Surgeon: [name], DO;  large hiatal hernia, LA grade D esophagitis with no bleeding, normal examined duodenum.    ESOPHAGOGASTRODUODENOSCOPY (EGD) WITH PROPOFOL N/A 10/03/2020   Procedure: ESOPHAGOGASTRODUODENOSCOPY (EGD) WITH PROPOFOL;  Surgeon: [name], DO;  [facility];  Service: Endoscopy;  Laterality: N/A;  2:00pm   HIP ARTHROPLASTY Right 11/13/2020   Procedure: ARTHROPLASTY BIPOLAR HIP (HEMIARTHROPLASTY);  Surgeon: Renette Butters, MD;  Location: [facility];  Service: Orthopedics;   Laterality: Right;   INGUINAL HERNIA REPAIR Right 01/10/2017   Procedure: OPEN RIGHT HERNIA REPAIR INGUINAL;  Surgeon: Ileana Roup, MD;  Location: [facility];  Service: General;  Laterality: Right;   INSERTION OF MESH Right 01/10/2017   Procedure: INSERTION OF MESH;  Surgeon: Ileana Roup, MD;  Location: [facility];  Service: General;  Laterality: Right;   INTRAOPERATIVE TRANSESOPHAGEAL ECHOCARDIOGRAM N/A 11/11/2012   Procedure: INTRAOPERATIVE TRANSESOPHAGEAL ECHOCARDIOGRAM;  Surgeon: Ivin Poot, MD;  Location: [facility];  Service: Open Heart Surgery;  Laterality: N/A;   LEFT HEART CATHETERIZATION WITH CORONARY ANGIOGRAM N/A 11/07/2012   Procedure: LEFT HEART CATHETERIZATION WITH CORONARY ANGIOGRAM;  Surgeon: Thayer Headings, MD;  Location[facility] C[facility] LAB;  Service: Cardiovascular;  Laterality: N/A;   LUMBAR LAMINECTOMY/DECOMPRESSION MICRODISCECTOMY N/A 02/05/2019   Procedure: LUMBAR LAMINECTOMY/DECOMPRESSION L3-L4;  Surgeon: Latanya Maudlin, MD;  Location: [facility];  Service: Orthopedics;  Laterality: N/A;  46min   LYMPH NODE DISSECTION     Bilateral pelvic   RETROPUBIC PROSTATECTOMY     Radical     Current Outpatient Medications  Medication Sig Dispense Refill   ascorbic acid (VITAMIN C) 500 MG tablet Take 1 tablet (500 mg total) by mouth daily. 30 tablet 1   aspirin 81 MG EC tablet Take 1 tablet (81 mg total) by mouth daily. Swallow whole. 30 tablet 0   carbidopa-levodopa (SINEMET IR) 25-100 MG tablet Take 1.5 tablets by mouth 3 (three) times daily. 405 tablet 3   Cholecalciferol (VITAMIN D) 2000 units CAPS Take 2,000 Units by mouth daily.     Dextromethorphan-guaiFENesin 10-100 MG/5ML liquid Take 5 mLs by mouth every 6 (six) hours. 473 mL 0   docusate sodium (COLACE) 100 MG capsule Take 1 capsule (100 mg total) by mouth 2 (two) times daily. 60 capsule 0   fluticasone (FLONASE) 50 MCG/ACT nasal spray One to 2 sprays each nostril at bedtime (Patient taking differently: Place 2 sprays  into both nostrils at bedtime.) 16 g 6   Iron, Ferrous Sulfate, 325 (65 Fe) MG TABS Take 1 tablet by mouth daily. 30 tablet 3   melatonin 5 MG TABS Take 1 tablet (5 mg total) by mouth at bedtime. 30 tablet 0   Multiple Vitamins-Minerals (CERTAVITE/ANTIOXIDANTS) TABS Take 1 tablet by mouth daily. 30 tablet 0   nitroGLYCERIN (NITROSTAT) 0.4 MG SL tablet Place 1 tablet (0.4 mg total) under the tongue every 5 (five) minutes as needed for chest pain. 25 tablet PRN   pantoprazole (PROTONIX) 40 MG tablet Take 1 tablet (40 mg total) by mouth 2 (two) times daily. 60 tablet 0   senna-docusate (SENOKOT-S) 8.6-50 MG tablet Take 2 tablets by mouth daily with supper. 60 tablet 0  triamcinolone cream (KENALOG) 0.1 % Apply 1 application topically daily as needed (itching).     No current facility-administered medications for this visit.    Allergies:   Crestor [rosuvastatin] and Trazodone and nefazodone    ROS:  Please see the history of present illness.   Otherwise, review of systems are positive for failure to none.   All other systems are reviewed and negative.    PHYSICAL EXAM: VS:  BP 138/70   Pulse 92   Ht 5\' 10"  (1.778 m)   Wt 156 lb (70.8 kg)   BMI 22.38 kg/m  , BMI Body mass index is 22.38 kg/m. GEN:  No distress.  Frail appearing.   NECK:  No jugular venous distention at 90 degrees, waveform within normal limits, carotid upstroke brisk and symmetric, no bruits, no thyromegaly LYMPHATICS:  No cervical adenopathy LUNGS:  Clear to auscultation bilaterally BACK:  No CVA tenderness CHEST:  Well healed sternotomy scar. HEART:  S1 and S2 within normal limits, no S3, no S4, no clicks, no rubs, 3 out of 6 apical systolic murmur radiating out the aortic outflow tract murmurs ABD:  Positive bowel sounds normal in frequency in pitch, no bruits, no rebound, no guarding, unable to assess midline mass or bruit with the patient seated. EXT:  2 plus pulses throughout, moderate edema, no cyanosis no  clubbing SKIN:  No rashes no nodules NEURO:  Cranial nerves II through XII grossly intact, motor grossly intact throughout PSYCH:  Cognitively intact, oriented to person place and time   EKG:  EKG is not ordered today. NA  Recent Labs: 08/17/2020: BNP 156.9; TSH 1.330 11/13/2020: Magnesium 1.8 12/14/2020: ALT 13; BUN 18; Creatinine, Ser 1.20; Hemoglobin 10.0; Platelets 447; Potassium 4.9; Sodium 139    Lipid Panel    Component Value Date/Time   CHOL 159 05/27/2019 1004   CHOL 173 11/06/2012 1058   TRIG 64 05/27/2019 1004   TRIG 55 07/07/2015 0945   TRIG 73 11/06/2012 1058   HDL 57 05/27/2019 1004   HDL 54 07/07/2015 0945   HDL 66 11/06/2012 1058   CHOLHDL 2.8 05/27/2019 1004   CHOLHDL 2.5 11/08/2012 0500   VLDL 11 11/08/2012 0500   LDLCALC 89 05/27/2019 1004   LDLCALC 92 09/03/2013 0959   LDLCALC 92 11/06/2012 1058      Wt Readings from Last 3 Encounters:  12/21/20 156 lb (70.8 kg)  12/14/20 154 lb 9.6 oz (70.1 kg)  12/02/20 153 lb 7 oz (69.6 kg)      Other studies Reviewed: Additional studies/ records that were reviewed today include: Hospital records Review of the above records demonstrates:  NA  Lab Results  Component Value Date   TSH 1.330 08/17/2020   Lab Results  Component Value Date   CREATININE 1.20 12/14/2020   ASSESSMENT AND PLAN:   Hx of CABG Is not having any unstable anginal symptoms.  No change in therapy.  HTN Blood pressure is at target.  No change in therapy.    Chronic renal insufficiency, stage III (moderate) Creatinine was 1.2 last month.  No change in therapy.   AS This has been mild and a follow-up clinically.   MR  This was mild on echo.  Again I will follow this clinically.  Hyperlipemia LDL was 89 with an HDL of 57.  No change in therapy.    Bifasicular block He has not been having any syncope.  I did have him wear a monitor.  No further evaluation.  Leg swellig  I think this needs to be managed conservatively.  I  think it would be hard for him to take diuretics even for a few days.  I talked to his son about this.  I talked to the patient about this.   They will put on compression stockings and keep his feet elevated and if it gets worse he might need a few days of diuretic.  Current medicines are reviewed at length with the patient today.  The patient does not have concerns regarding medicines.  The following changes have been made:  None  Labs/ tests ordered today include:   No orders of the defined types were placed in this encounter.     Disposition:   FU with me in 6 months   Signed, Jon Breeding, MD  12/21/2020 11:38 AM    [facility]

## 2020-12-21 ENCOUNTER — Ambulatory Visit (INDEPENDENT_AMBULATORY_CARE_PROVIDER_SITE_OTHER): Payer: 59 | Admitting: Cardiology

## 2020-12-21 ENCOUNTER — Encounter: Payer: Self-pay | Admitting: Cardiology

## 2020-12-21 ENCOUNTER — Other Ambulatory Visit: Payer: Self-pay

## 2020-12-21 VITALS — BP 138/70 | HR 92 | Ht 70.0 in | Wt 156.0 lb

## 2020-12-21 DIAGNOSIS — I452 Bifascicular block: Secondary | ICD-10-CM | POA: Diagnosis not present

## 2020-12-21 DIAGNOSIS — I208 Other forms of angina pectoris: Secondary | ICD-10-CM | POA: Diagnosis not present

## 2020-12-21 DIAGNOSIS — E785 Hyperlipidemia, unspecified: Secondary | ICD-10-CM

## 2020-12-21 DIAGNOSIS — I10 Essential (primary) hypertension: Secondary | ICD-10-CM

## 2020-12-21 DIAGNOSIS — N1831 Chronic kidney disease, stage 3a: Secondary | ICD-10-CM

## 2020-12-21 DIAGNOSIS — I2510 Atherosclerotic heart disease of native coronary artery without angina pectoris: Secondary | ICD-10-CM | POA: Diagnosis not present

## 2020-12-21 NOTE — Patient Instructions (Signed)
Medication Instructions:  The current medical regimen is effective;  continue present plan and medications.  *If you need a refill on your cardiac medications before your next appointment, please call your pharmacy*  Follow-Up: At [facility], you and your health needs are our priority.  As part of our continuing mission to provide you with exceptional heart care, we have created designated Provider Care Teams.  These Care Teams include your primary Cardiologist (physician) and Advanced Practice Providers (APPs -  Physician Assistants and Nurse Practitioners) who all work together to provide you with the care you need, when you need it.  We recommend signing up for the patient portal called "MyChart".  Sign up information is provided on this After Visit Summary.  MyChart is used to connect with patients for Virtual Visits (Telemedicine).  Patients are able to view lab/test results, encounter notes, upcoming appointments, etc.  Non-urgent messages can be sent to your provider as well.   To learn more about what you can do with MyChart, go to [email or web address].    Your next appointment:   6 month(s)  The format for your next appointment:   In Person  Provider:   Minus Breeding, MD   Thank you for choosing [facility]!!    Please keep your feet and legs elevated ABOVE the level of your heart.  Wear knee high compression stockings daily.  You may remove these at bedtime.  (20-33 mm/hg strength - you can buy these at most any pharmacy.

## 2020-12-22 ENCOUNTER — Encounter: Payer: MEDICARE | Attending: Physical Medicine and Rehabilitation | Admitting: Physical Medicine and Rehabilitation

## 2020-12-22 ENCOUNTER — Encounter: Payer: Self-pay | Admitting: Physical Medicine and Rehabilitation

## 2020-12-22 VITALS — BP 139/71 | HR 78 | Temp 99.8°F | Ht 70.0 in | Wt 156.0 lb

## 2020-12-22 DIAGNOSIS — K5900 Constipation, unspecified: Secondary | ICD-10-CM | POA: Diagnosis not present

## 2020-12-22 DIAGNOSIS — S72001A Fracture of unspecified part of neck of right femur, initial encounter for closed fracture: Secondary | ICD-10-CM | POA: Diagnosis not present

## 2020-12-22 DIAGNOSIS — U071 COVID-19: Secondary | ICD-10-CM | POA: Diagnosis not present

## 2020-12-22 NOTE — Progress Notes (Signed)
Subjective:    Patient ID: Jon Reid, male    DOB: 1928-11-30, 87 y.o.   MRN: [id number]  HPI Jon Reid is a 63 year year old man who presents for follow-up after COVID pneumonia and hip fracture  -his breathing is good. -he has been walking with a rolling walker -right hip pain is well controlled- 8/10. Not taking anything for pain.  -accompanied by son today.  -He used to have gout and he ate cherries and this  helped. -he went to his PCP and got a refill of his medications.   Pain Inventory Average Pain 8 Pain Right Now 8 My pain is intermittent and trob  LOCATION OF PAIN  Right knee pain  BOWEL Number of stools per week: 3   BLADDER Normal I Mobility walk with assistance use a walker ability to climb steps?  yes do you drive?  no Do you have any goals in this area?  yes  Function retired  Neuro/Psych trouble walking  Prior Studies Any changes since last visit?  no  Physicians involved in your care Any changes since last visit?  no   Family History  Problem Relation Age of Onset   Aneurysm Father        Cerebral   Stroke Father    Hypertension Father    Cancer Mother        colon   Parkinson's disease Son    Social History   Socioeconomic History   Marital status: Married    Spouse name: [name]   Number of children: 3   Years of education: 12   Highest education level: High school graduate  Occupational History   Occupation: Retired  Tobacco Use   Smoking status: Never   Smokeless tobacco: Never  Scientific laboratory technician Use: Never used  Substance and Sexual Activity   Alcohol use: Yes    Comment: Rare   Drug use: No   Sexual activity: Not Currently  Other Topics Concern   Not on file  Social History Narrative   Lives at home with wife/sons will stay with them occassionally   Married with 3 children   Denies caffeine use    Social Determinants of Health   Financial Resource Strain: Not on file  Food Insecurity: Not on file   Transportation Needs: Not on file  Physical Activity: Not on file  Stress: Not on file  Social Connections: Not on file   Past Surgical History:  Procedure Laterality Date   BALLOON DILATION N/A 10/03/2020   Procedure: [place];  Surgeon: [name], DO;  [facility];  Service: Endoscopy;  Laterality: N/A;   CARDIAC CATHETERIZATION  11/07/2012   Dr Acie Fredrickson   CATARACT EXTRACTION W/ INTRAOCULAR LENS IMPLANT Bilateral    CORONARY ARTERY BYPASS GRAFT N/A 11/11/2012   Procedure: CORONARY ARTERY BYPASS GRAFTING times four using Right Greater Saphenous Vein Graft harvested endoscopically and Left Internal Mammary Artery.;  Surgeon: Ivin Poot, MD;  Location: [place];  Service: Open Heart Surgery;  Laterality: N/A;   ESOPHAGEAL BRUSHING  10/03/2020   Procedure: ESOPHAGEAL BRUSHING;  Surgeon: [name], DO;  [facility];  Service: Endoscopy;;   ESOPHAGOGASTRODUODENOSCOPY (EGD) WITH PROPOFOL N/A 06/27/2020   Surgeon: [name], DO;  large hiatal hernia, LA grade D esophagitis with no bleeding, normal examined duodenum.    ESOPHAGOGASTRODUODENOSCOPY (EGD) WITH PROPOFOL N/A 10/03/2020   Procedure: ESOPHAGOGASTRODUODENOSCOPY (EGD) WITH PROPOFOL;  Surgeon: [name],  DO;  [facility];  Service: Endoscopy;  Laterality: N/A;  2:00pm   HIP ARTHROPLASTY Right 11/13/2020   Procedure: ARTHROPLASTY BIPOLAR HIP (HEMIARTHROPLASTY);  Surgeon: Renette Butters, MD;  Location: [place];  Service: Orthopedics;  Laterality: Right;   INGUINAL HERNIA REPAIR Right 01/10/2017   Procedure: OPEN RIGHT HERNIA REPAIR INGUINAL;  Surgeon: Ileana Roup, MD;  Location: [facility];  Service: General;  Laterality: Right;   INSERTION OF MESH Right 01/10/2017   Procedure: INSERTION OF MESH;  Surgeon: Ileana Roup, MD;  Location: [facility];  Service: General;  Laterality: Right;   INTRAOPERATIVE TRANSESOPHAGEAL ECHOCARDIOGRAM N/A 11/11/2012   Procedure:  INTRAOPERATIVE TRANSESOPHAGEAL ECHOCARDIOGRAM;  Surgeon: Ivin Poot, MD;  Location: [place];  Service: Open Heart Surgery;  Laterality: N/A;   LEFT HEART CATHETERIZATION WITH CORONARY ANGIOGRAM N/A 11/07/2012   Procedure: LEFT HEART CATHETERIZATION WITH CORONARY ANGIOGRAM;  Surgeon: Thayer Headings, MD;  Location: [facility];  Service: Cardiovascular;  Laterality: N/A;   LUMBAR LAMINECTOMY/DECOMPRESSION MICRODISCECTOMY N/A 02/05/2019   Procedure: LUMBAR LAMINECTOMY/DECOMPRESSION L3-L4;  Surgeon: Latanya Maudlin, MD;  Location: [facility];  Service: Orthopedics;  Laterality: N/A;  55min   LYMPH NODE DISSECTION     Bilateral pelvic   RETROPUBIC PROSTATECTOMY     Radical   Past Medical History:  Diagnosis Date   Arthritis    Carcinoma of prostate ([place])    prostate   CKD (chronic kidney disease), stage III (HCC)    Coronary artery disease    a. 10/2012 Cath: LM 20-30d, lAD 80p, 37m, 80-90d, LCX 90-52m, OM1 90, RCA 50p, 60m, 50d, RPDA 90, EF nl; b. 10/2012 CABG x 4 (LIMA-LAD, SVG-OM1, SVG-PDA->RPL); c. 07/2015 MV: Basal and proximal septal infarct w/o ischemia. EF 57%-->low risk.   First degree AV block 01/28/2019   Noted on EKG   GERD (gastroesophageal reflux disease)    Gout    HTN (hypertension)    Macrocytosis without anemia 01/08/2012   Moderate aortic stenosis    a. 10/2012 Echo: EF 55-60%, no rwma, BAE, mild AI, mild TR; b. 06/2020 Echo: EF 50-55%, no rwma, mild LVH. Mod dil LA. Mild MR. Mod AS.   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   Sleep apnea    Questionable   Spinal stenosis    Stroke (HCC)    BP 139/71   Pulse 78   Temp 99.8 F (37.7 C)   Ht 5\' 10"  (1.778 m)   Wt 156 lb (70.8 kg)   SpO2 95%   BMI 22.38 kg/m   Opioid Risk Score:   Fall Risk Score:  `1  Depression screen PHQ 2/9  Depression screen [facility] 2/9 12/14/2020 11/08/2020 09/23/2020 08/17/2020 07/15/2020 06/21/2020 06/21/2020  Decreased  Interest 0 0 0 1 0 0 0  Down, Depressed, Hopeless 0 0 - 0 0 0 0  PHQ - 2 Score 0 0 0 1 0 0 0  Altered sleeping - 0 - 1 - - -  Tired, decreased energy - 3 - 1 - - -  Change in appetite - 0 - 0 - - -  Feeling bad or failure about yourself  - 0 - 0 - - -  Trouble concentrating - 3 - 1 - - -  Moving slowly or fidgety/restless - 3 - 1 - - -  Suicidal thoughts - 0 - 0 - - -  PHQ-9 Score -  9 - 5 - - -  Difficult doing work/chores - Very difficult - Somewhat difficult - - -  Some recent data might be hidden    Review of Systems  Musculoskeletal:  Positive for gait problem.       Knee pain  All other systems reviewed and are negative.     Objective:   Physical Exam Gen: no distress, normal appearing HEENT: oral mucosa pink and moist, NCAT Cardio: Reg rate Chest: normal effort, normal rate of breathing Abd: soft, non-distended Ext: no edema Psych: pleasant, normal affect Skin: intact Neuro: Alert and oriented x3 Musculoskeletal: 4/5 strength throughout    Assessment & Plan:   1) COVID pneumonia -continue incentive spirometer at home -continue physical therapy, occupational therapy -encouraged going for a walk every day  2) Right hip pain s/p fracture -encouraged use of rolling walker.  -provided with a pain relief journal.   3) Constipation:  -continue colace BID -Provided list of following foods that help with constipation and highlighted a few: 1) prunes- contain high amounts of fiber.  2) apples- has a form of dietary fiber called pectin that accelerates stool movement and increases beneficial gut bacteria 3) pears- in addition to fiber, also high in fructose and sorbitol which have laxative effect 4) figs- contain an enzyme ficin which helps to speed colonic transit 5) kiwis- contain an enzyme actinidin that improves gut motility and reduces constipation 6) oranges- rich in pectin (like apples) 7) grapefruits- contain a flavanol naringenin which has a laxative  effect 8) vegetables- rich in fiber and also great sources of folate, vitamin C, and K 9) artichoke- high in inulin, prebiotic great for the microbiome 10) chicory- increases stool frequency and softness (can be added to coffee) 11) rhubarb- laxative effect 12) sweet potato- high fiber 13) beans, peas, and lentils- contain both soluble and insoluble fiber 14) chia seeds- improves intestinal health and gut flora 15) flaxseeds- laxative effect 16) whole grain rye bread- high in fiber 17) oat bran- high in soluble and insoluble fiber 18) kefir- softens stools -recommended to try at least one of these foods every day.  -drink 6-8 glasses of water per day -walk regularly, especially after meals.

## 2020-12-22 NOTE — Patient Instructions (Signed)
Constipation:  -Provided list of following foods that help with constipation and highlighted a few: 1) prunes- contain high amounts of fiber.  2) apples- has a form of dietary fiber called pectin that accelerates stool movement and increases beneficial gut bacteria 3) pears- in addition to fiber, also high in fructose and sorbitol which have laxative effect 4) figs- contain an enzyme ficin which helps to speed colonic transit 5) kiwis- contain an enzyme actinidin that improves gut motility and reduces constipation 6) oranges- rich in pectin (like apples) 7) grapefruits- contain a flavanol naringenin which has a laxative effect 8) vegetables- rich in fiber and also great sources of folate, vitamin C, and K 9) artichoke- high in inulin, prebiotic great for the microbiome 10) chicory- increases stool frequency and softness (can be added to coffee) 11) rhubarb- laxative effect 12) sweet potato- high fiber 13) beans, peas, and lentils- contain both soluble and insoluble fiber 14) chia seeds- improves intestinal health and gut flora 15) flaxseeds- laxative effect 16) whole grain rye bread- high in fiber 17) oat bran- high in soluble and insoluble fiber 18) kefir- softens stools -recommended to try at least one of these foods every day.  -drink 6-8 glasses of water per day -walk regularly, especially after meals.      

## 2020-12-23 DIAGNOSIS — D62 Acute posthemorrhagic anemia: Secondary | ICD-10-CM | POA: Diagnosis not present

## 2020-12-23 DIAGNOSIS — S72011D Unspecified intracapsular fracture of right femur, subsequent encounter for closed fracture with routine healing: Secondary | ICD-10-CM | POA: Diagnosis not present

## 2020-12-23 DIAGNOSIS — J189 Pneumonia, unspecified organism: Secondary | ICD-10-CM | POA: Diagnosis not present

## 2020-12-23 DIAGNOSIS — N179 Acute kidney failure, unspecified: Secondary | ICD-10-CM | POA: Diagnosis not present

## 2020-12-23 DIAGNOSIS — G20 Parkinson's disease: Secondary | ICD-10-CM | POA: Diagnosis not present

## 2020-12-23 DIAGNOSIS — I129 Hypertensive chronic kidney disease with stage 1 through stage 4 chronic kidney disease, or unspecified chronic kidney disease: Secondary | ICD-10-CM | POA: Diagnosis not present

## 2020-12-26 DIAGNOSIS — D62 Acute posthemorrhagic anemia: Secondary | ICD-10-CM | POA: Diagnosis not present

## 2020-12-26 DIAGNOSIS — N179 Acute kidney failure, unspecified: Secondary | ICD-10-CM | POA: Diagnosis not present

## 2020-12-26 DIAGNOSIS — J189 Pneumonia, unspecified organism: Secondary | ICD-10-CM | POA: Diagnosis not present

## 2020-12-26 DIAGNOSIS — I129 Hypertensive chronic kidney disease with stage 1 through stage 4 chronic kidney disease, or unspecified chronic kidney disease: Secondary | ICD-10-CM | POA: Diagnosis not present

## 2020-12-26 DIAGNOSIS — S72011D Unspecified intracapsular fracture of right femur, subsequent encounter for closed fracture with routine healing: Secondary | ICD-10-CM | POA: Diagnosis not present

## 2020-12-26 DIAGNOSIS — G20 Parkinson's disease: Secondary | ICD-10-CM | POA: Diagnosis not present

## 2020-12-27 DIAGNOSIS — S72011D Unspecified intracapsular fracture of right femur, subsequent encounter for closed fracture with routine healing: Secondary | ICD-10-CM | POA: Diagnosis not present

## 2020-12-27 DIAGNOSIS — N179 Acute kidney failure, unspecified: Secondary | ICD-10-CM | POA: Diagnosis not present

## 2020-12-27 DIAGNOSIS — J189 Pneumonia, unspecified organism: Secondary | ICD-10-CM | POA: Diagnosis not present

## 2020-12-27 DIAGNOSIS — I129 Hypertensive chronic kidney disease with stage 1 through stage 4 chronic kidney disease, or unspecified chronic kidney disease: Secondary | ICD-10-CM | POA: Diagnosis not present

## 2020-12-27 DIAGNOSIS — D62 Acute posthemorrhagic anemia: Secondary | ICD-10-CM | POA: Diagnosis not present

## 2020-12-27 DIAGNOSIS — G20 Parkinson's disease: Secondary | ICD-10-CM | POA: Diagnosis not present

## 2020-12-28 ENCOUNTER — Other Ambulatory Visit: Payer: Self-pay

## 2020-12-28 ENCOUNTER — Ambulatory Visit (INDEPENDENT_AMBULATORY_CARE_PROVIDER_SITE_OTHER): Payer: MEDICARE

## 2020-12-28 DIAGNOSIS — M1990 Unspecified osteoarthritis, unspecified site: Secondary | ICD-10-CM | POA: Diagnosis not present

## 2020-12-28 DIAGNOSIS — M4800 Spinal stenosis, site unspecified: Secondary | ICD-10-CM

## 2020-12-28 DIAGNOSIS — I350 Nonrheumatic aortic (valve) stenosis: Secondary | ICD-10-CM

## 2020-12-28 DIAGNOSIS — W19XXXD Unspecified fall, subsequent encounter: Secondary | ICD-10-CM

## 2020-12-28 DIAGNOSIS — F482 Pseudobulbar affect: Secondary | ICD-10-CM

## 2020-12-28 DIAGNOSIS — J189 Pneumonia, unspecified organism: Secondary | ICD-10-CM | POA: Diagnosis not present

## 2020-12-28 DIAGNOSIS — R1312 Dysphagia, oropharyngeal phase: Secondary | ICD-10-CM | POA: Diagnosis not present

## 2020-12-28 DIAGNOSIS — S72011D Unspecified intracapsular fracture of right femur, subsequent encounter for closed fracture with routine healing: Secondary | ICD-10-CM

## 2020-12-28 DIAGNOSIS — E876 Hypokalemia: Secondary | ICD-10-CM

## 2020-12-28 DIAGNOSIS — M1030 Gout due to renal impairment, unspecified site: Secondary | ICD-10-CM

## 2020-12-28 DIAGNOSIS — G20 Parkinson's disease: Secondary | ICD-10-CM | POA: Diagnosis not present

## 2020-12-28 DIAGNOSIS — N179 Acute kidney failure, unspecified: Secondary | ICD-10-CM

## 2020-12-28 DIAGNOSIS — Z7982 Long term (current) use of aspirin: Secondary | ICD-10-CM

## 2020-12-28 DIAGNOSIS — D62 Acute posthemorrhagic anemia: Secondary | ICD-10-CM

## 2020-12-28 DIAGNOSIS — C61 Malignant neoplasm of prostate: Secondary | ICD-10-CM

## 2020-12-28 DIAGNOSIS — I2510 Atherosclerotic heart disease of native coronary artery without angina pectoris: Secondary | ICD-10-CM

## 2020-12-28 DIAGNOSIS — I129 Hypertensive chronic kidney disease with stage 1 through stage 4 chronic kidney disease, or unspecified chronic kidney disease: Secondary | ICD-10-CM | POA: Diagnosis not present

## 2020-12-28 DIAGNOSIS — Z9181 History of falling: Secondary | ICD-10-CM

## 2020-12-28 DIAGNOSIS — N189 Chronic kidney disease, unspecified: Secondary | ICD-10-CM | POA: Diagnosis not present

## 2020-12-28 DIAGNOSIS — E43 Unspecified severe protein-calorie malnutrition: Secondary | ICD-10-CM

## 2020-12-28 DIAGNOSIS — K219 Gastro-esophageal reflux disease without esophagitis: Secondary | ICD-10-CM

## 2020-12-28 DIAGNOSIS — Z8616 Personal history of COVID-19: Secondary | ICD-10-CM

## 2020-12-29 DIAGNOSIS — N179 Acute kidney failure, unspecified: Secondary | ICD-10-CM | POA: Diagnosis not present

## 2020-12-29 DIAGNOSIS — G20 Parkinson's disease: Secondary | ICD-10-CM | POA: Diagnosis not present

## 2020-12-29 DIAGNOSIS — I129 Hypertensive chronic kidney disease with stage 1 through stage 4 chronic kidney disease, or unspecified chronic kidney disease: Secondary | ICD-10-CM | POA: Diagnosis not present

## 2020-12-29 DIAGNOSIS — D62 Acute posthemorrhagic anemia: Secondary | ICD-10-CM | POA: Diagnosis not present

## 2020-12-29 DIAGNOSIS — S72011D Unspecified intracapsular fracture of right femur, subsequent encounter for closed fracture with routine healing: Secondary | ICD-10-CM | POA: Diagnosis not present

## 2020-12-29 DIAGNOSIS — J189 Pneumonia, unspecified organism: Secondary | ICD-10-CM | POA: Diagnosis not present

## 2021-01-05 ENCOUNTER — Ambulatory Visit (INDEPENDENT_AMBULATORY_CARE_PROVIDER_SITE_OTHER): Payer: MEDICARE | Admitting: Family Medicine

## 2021-01-05 ENCOUNTER — Encounter: Payer: Self-pay | Admitting: Family Medicine

## 2021-01-05 ENCOUNTER — Other Ambulatory Visit: Payer: Self-pay

## 2021-01-05 VITALS — BP 135/80 | HR 70 | Temp 97.4°F | Resp 20 | Ht 70.0 in | Wt 158.0 lb

## 2021-01-05 DIAGNOSIS — R601 Generalized edema: Secondary | ICD-10-CM | POA: Diagnosis not present

## 2021-01-05 DIAGNOSIS — S72001A Fracture of unspecified part of neck of right femur, initial encounter for closed fracture: Secondary | ICD-10-CM | POA: Diagnosis not present

## 2021-01-05 DIAGNOSIS — R0602 Shortness of breath: Secondary | ICD-10-CM | POA: Diagnosis not present

## 2021-01-05 DIAGNOSIS — I208 Other forms of angina pectoris: Secondary | ICD-10-CM

## 2021-01-05 DIAGNOSIS — R54 Age-related physical debility: Secondary | ICD-10-CM | POA: Diagnosis not present

## 2021-01-05 DIAGNOSIS — Z96649 Presence of unspecified artificial hip joint: Secondary | ICD-10-CM | POA: Diagnosis not present

## 2021-01-05 MED ORDER — FUROSEMIDE 40 MG PO TABS: 40.0000 mg | ORAL_TABLET | Freq: Every day | ORAL | 5 refills | Status: DC

## 2021-01-05 MED ORDER — POTASSIUM CHLORIDE CRYS ER 20 MEQ PO TBCR: 20.0000 meq | EXTENDED_RELEASE_TABLET | Freq: Every day | ORAL | 5 refills | Status: DC

## 2021-01-05 NOTE — Progress Notes (Signed)
`  Subjective:  Patient ID: Jon Reid, male    DOB: 1928/09/25  Age: 87 y.o. MRN: [id number]  CC: No chief complaint on file.   HPI Jon Reid presents for bilateral leg edema. R>L. Recently had fx of right femur with hemiarthroplasty. Just finished PT, but still weak. RLE feel like it weighs hundred pounds when he tries to walk. Now relying on a walker due to weakness and shortness of breath. Has no energy.   Depression screen [facility] 2/9 01/05/2021 12/22/2020 12/14/2020  Decreased Interest 0 0 0  Down, Depressed, Hopeless 0 0 0  PHQ - 2 Score 0 0 0  Altered sleeping 0 0 -  Tired, decreased energy 0 1 -  Change in appetite 0 0 -  Feeling bad or failure about yourself  0 1 -  Trouble concentrating 0 1 -  Moving slowly or fidgety/restless 0 0 -  Suicidal thoughts 0 0 -  PHQ-9 Score 0 3 -  Difficult doing work/chores Not difficult at all - -  Some recent data might be hidden    History Jon Reid has a past medical history of Arthritis, Carcinoma of prostate ([place]), CKD (chronic kidney disease), stage III ([place]), Coronary artery disease, First degree AV block (01/28/2019), GERD (gastroesophageal reflux disease), Gout, HTN (hypertension), Macrocytosis without anemia (01/08/2012), Moderate aortic stenosis, [name] affect (02/09/2020), Right BBB/left ant fasc block (01/28/2019), S/P CABG x 4 (02/02/2013), Sleep apnea, Spinal stenosis, and Stroke ([place]).   He has a past surgical history that includes Retropubic prostatectomy; Lymph node dissection; Cardiac catheterization (11/07/2012); Coronary artery bypass graft (N/A, 11/11/2012); Intraoprative transesophageal echocardiogram (N/A, 11/11/2012); left heart catheterization with coronary angiogram (N/A, 11/07/2012); Inguinal hernia repair (Right, 01/10/2017); Insertion of mesh (Right, 01/10/2017); Cataract extraction w/ intraocular lens implant (Bilateral); Lumbar laminectomy/decompression microdiscectomy (N/A, 02/05/2019); Esophagogastroduodenoscopy  (egd) with propofol (N/A, 06/27/2020); Esophagogastroduodenoscopy (egd) with propofol (N/A, 10/03/2020); Balloon dilation (N/A, 10/03/2020); Esophageal brushing (10/03/2020); and Hip Arthroplasty (Right, 11/13/2020).   His family history includes Aneurysm in his father; Cancer in his mother; Hypertension in his father; Parkinson's disease in his son; Stroke in his father.He reports that he has never smoked. He has never used smokeless tobacco. He reports current alcohol use. He reports that he does not use drugs.    ROS Review of Systems  Constitutional:  Negative for fever.  Respiratory:  Negative for shortness of breath.   Cardiovascular:  Negative for chest pain.  Musculoskeletal:  Negative for arthralgias.  Skin:  Negative for rash.   Objective:  BP 135/80   Pulse 70   Temp (!) 97.4 F (36.3 C) (Temporal)   Resp 20   Ht _0  (1.778 m)   Wt 158 lb (71.7 kg)   SpO2 98%   BMI 22.67 kg/m   BP Readings from Last 3 Encounters:  01/05/21 135/80  12/22/20 139/71  12/21/20 138/70    Wt Readings from Last 3 Encounters:  01/05/21 158 lb (71.7 kg)  12/22/20 156 lb (70.8 kg)  12/21/20 156 lb (70.8 kg)     Physical Exam Vitals reviewed.  Constitutional:      Appearance: He is well-developed.  HENT:     Head: Normocephalic and atraumatic.     Right Ear: External ear normal.     Left Ear: External ear normal.     Mouth/Throat:     Pharynx: No oropharyngeal exudate or posterior oropharyngeal erythema.  Eyes:     Pupils: Pupils are equal, round, and reactive to light.  Cardiovascular:  Rate and Rhythm: Normal rate and regular rhythm.     Heart sounds: No murmur heard. Pulmonary:     Effort: No respiratory distress.     Breath sounds: Normal breath sounds.  Musculoskeletal:     Cervical back: Normal range of motion and neck supple.     Right lower leg: Edema (4+) present.     Left lower leg: Edema (4+) present.     Comments: BLE weak, slow ambulation, unsteady, with a  walker.  Neurological:     Mental Status: He is alert and oriented to person, place, and time.      Assessment & Plan:   Diagnoses and all orders for this visit:  Frail elderly -     Ambulatory referral to Physical Therapy  Closed right hip fracture, initial encounter [facility]) -     Ambulatory referral to Physical Therapy  S/P hip hemiarthroplasty -     Ambulatory referral to Physical Therapy  Shortness of breath -     CBC with Differential/Platelet -     CMP14+EGFR -     Pro b natriuretic peptide (BNP)  Generalized edema -     CBC with Differential/Platelet -     CMP14+EGFR -     Pro b natriuretic peptide (BNP)  Other orders -     potassium chloride SA (KLOR-CON) 20 MEQ tablet; Take 1 tablet (20 mEq total) by mouth daily. For potassium replacement/ supplement -     furosemide (LASIX) 40 MG tablet; Take 1 tablet (40 mg total) by mouth daily. For swelling      I am having Jon Reid start on potassium chloride SA and furosemide. I am also having him maintain his fluticasone, nitroGLYCERIN, Vitamin D, ascorbic acid, triamcinolone cream, carbidopa-levodopa, Dextromethorphan-guaiFENesin, docusate sodium, melatonin, pantoprazole, senna-docusate, CertaVite/Antioxidants, aspirin, and Iron (Ferrous Sulfate).  Allergies as of 01/05/2021       Reactions   Crestor [rosuvastatin] Swelling   GYNECOMASTIA    Trazodone And Nefazodone    Hallucinations        Medication List        Accurate as of January 05, 2021 12:35 PM. If you have any questions, ask your nurse or doctor.          ascorbic acid 500 MG tablet Commonly known as: VITAMIN C Take 1 tablet (500 mg total) by mouth daily.   Aspirin Low Dose 81 MG EC tablet Generic drug: aspirin Take 1 tablet (81 mg total) by mouth daily. Swallow whole.   carbidopa-levodopa 25-100 MG tablet Commonly known as: SINEMET IR Take 1.5 tablets by mouth 3 (three) times daily.   CertaVite/Antioxidants Tabs Take 1 tablet  by mouth daily.   docusate sodium 100 MG capsule Commonly known as: COLACE Take 1 capsule (100 mg total) by mouth 2 (two) times daily.   fluticasone 50 MCG/ACT nasal spray Commonly known as: FLONASE One to 2 sprays each nostril at bedtime What changed:  how much to take how to take this when to take this additional instructions   furosemide 40 MG tablet Commonly known as: LASIX Take 1 tablet (40 mg total) by mouth daily. For swelling Started by: Claretta Fraise, MD   Iron (Ferrous Sulfate) 325 (65 Fe) MG Tabs Take 1 tablet by mouth daily.   melatonin 5 MG Tabs Take 1 tablet (5 mg total) by mouth at bedtime.   nitroGLYCERIN 0.4 MG SL tablet Commonly known as: NITROSTAT Place 1 tablet (0.4 mg total) under the tongue every 5 (five)  minutes as needed for chest pain.   pantoprazole 40 MG tablet Commonly known as: PROTONIX Take 1 tablet (40 mg total) by mouth 2 (two) times daily.   potassium chloride SA 20 MEQ tablet Commonly known as: KLOR-CON Take 1 tablet (20 mEq total) by mouth daily. For potassium replacement/ supplement Started by: Claretta Fraise, MD   Senexon-S 8.6-50 MG tablet Generic drug: senna-docusate Take 2 tablets by mouth daily with supper.   SM Tussin DM 10-100 MG/5ML liquid Generic drug: Dextromethorphan-guaiFENesin Take 5 mLs by mouth every 6 (six) hours.   triamcinolone cream 0.1 % Commonly known as: KENALOG Apply 1 application topically daily as needed (itching).   Vitamin D 50 MCG (2000 UT) Caps Take 2,000 Units by mouth daily.         Follow-up: No follow-ups on file.  Claretta Fraise, M.D.

## 2021-01-06 ENCOUNTER — Encounter: Payer: Self-pay | Admitting: *Deleted

## 2021-01-06 ENCOUNTER — Telehealth: Payer: Self-pay | Admitting: Family Medicine

## 2021-01-06 DIAGNOSIS — J189 Pneumonia, unspecified organism: Secondary | ICD-10-CM | POA: Diagnosis not present

## 2021-01-06 DIAGNOSIS — D62 Acute posthemorrhagic anemia: Secondary | ICD-10-CM | POA: Diagnosis not present

## 2021-01-06 DIAGNOSIS — G20 Parkinson's disease: Secondary | ICD-10-CM | POA: Diagnosis not present

## 2021-01-06 DIAGNOSIS — N179 Acute kidney failure, unspecified: Secondary | ICD-10-CM | POA: Diagnosis not present

## 2021-01-06 DIAGNOSIS — I129 Hypertensive chronic kidney disease with stage 1 through stage 4 chronic kidney disease, or unspecified chronic kidney disease: Secondary | ICD-10-CM | POA: Diagnosis not present

## 2021-01-06 DIAGNOSIS — S72011D Unspecified intracapsular fracture of right femur, subsequent encounter for closed fracture with routine healing: Secondary | ICD-10-CM | POA: Diagnosis not present

## 2021-01-06 LAB — CMP14+EGFR
ALT: 10 IU/L (ref 0–44)
AST: 22 IU/L (ref 0–40)
Albumin/Globulin Ratio: 1.3 (ref 1.2–2.2)
Albumin: 3.6 g/dL (ref 3.5–4.6)
Alkaline Phosphatase: 106 IU/L (ref 44–121)
BUN/Creatinine Ratio: 12 (ref 10–24)
BUN: 13 mg/dL (ref 10–36)
Bilirubin Total: 0.2 mg/dL (ref 0.0–1.2)
CO2: 21 mmol/L (ref 20–29)
Calcium: 8.6 mg/dL (ref 8.6–10.2)
Chloride: 105 mmol/L (ref 96–106)
Creatinine, Ser: 1.08 mg/dL (ref 0.76–1.27)
Globulin, Total: 2.7 g/dL (ref 1.5–4.5)
Glucose: 95 mg/dL (ref 70–99)
Potassium: 4.2 mmol/L (ref 3.5–5.2)
Sodium: 139 mmol/L (ref 134–144)
Total Protein: 6.3 g/dL (ref 6.0–8.5)
eGFR: 65 mL/min/{1.73_m2} (ref >59)

## 2021-01-06 LAB — CBC WITH DIFFERENTIAL/PLATELET
Basophils Absolute: 0 10*3/uL (ref 0.0–0.2)
Basos: 1 %
EOS (ABSOLUTE): 0.1 10*3/uL (ref 0.0–0.4)
Eos: 1 %
Hematocrit: 27.6 % — ABNORMAL LOW (ref 37.5–51.0)
Hemoglobin: 8.6 g/dL — CL (ref 13.0–17.7)
Immature Grans (Abs): 0 10*3/uL (ref 0.0–0.1)
Immature Granulocytes: 0 %
Lymphocytes Absolute: 1.8 10*3/uL (ref 0.7–3.1)
Lymphs: 28 %
MCH: 29.4 pg (ref 26.6–33.0)
MCHC: 31.2 g/dL — ABNORMAL LOW (ref 31.5–35.7)
MCV: 94 fL (ref 79–97)
Monocytes Absolute: 0.6 10*3/uL (ref 0.1–0.9)
Monocytes: 10 %
Neutrophils Absolute: 3.8 10*3/uL (ref 1.4–7.0)
Neutrophils: 60 %
Platelets: 377 10*3/uL (ref 150–450)
RBC: 2.93 x10E6/uL — ABNORMAL LOW (ref 4.14–5.80)
RDW: 15.6 % — ABNORMAL HIGH (ref 11.6–15.4)
WBC: 6.3 10*3/uL (ref 3.4–10.8)

## 2021-01-06 LAB — PRO B NATRIURETIC PEPTIDE: NT-Pro BNP: 1725 pg/mL — ABNORMAL HIGH (ref 0–486)

## 2021-01-06 NOTE — Telephone Encounter (Signed)
Explained to wife about his hemoglobin. She said if he got any worse they would go to ED, but at this time she thinks he is doing better.

## 2021-01-06 NOTE — Patient Instructions (Signed)
Monia Pouch, FNP received critical labs on patient, she is covering for Dr. Livia Snellen. Verbal orders were given to pt via telephone as follows. Make sure to take the iron and lasix everyday. Recheck CBC on Monday. Go to ED if symptomatic. Pt says he is weak and dizzy. I explained that these are symptoms of low hemaglobin and should go to Ed. He said he would go if he got any worse.

## 2021-01-08 DIAGNOSIS — W19XXXD Unspecified fall, subsequent encounter: Secondary | ICD-10-CM | POA: Diagnosis not present

## 2021-01-08 DIAGNOSIS — M4800 Spinal stenosis, site unspecified: Secondary | ICD-10-CM | POA: Diagnosis not present

## 2021-01-08 DIAGNOSIS — N189 Chronic kidney disease, unspecified: Secondary | ICD-10-CM | POA: Diagnosis not present

## 2021-01-08 DIAGNOSIS — I350 Nonrheumatic aortic (valve) stenosis: Secondary | ICD-10-CM | POA: Diagnosis not present

## 2021-01-08 DIAGNOSIS — Z8616 Personal history of COVID-19: Secondary | ICD-10-CM | POA: Diagnosis not present

## 2021-01-08 DIAGNOSIS — R1312 Dysphagia, oropharyngeal phase: Secondary | ICD-10-CM | POA: Diagnosis not present

## 2021-01-08 DIAGNOSIS — Z951 Presence of aortocoronary bypass graft: Secondary | ICD-10-CM | POA: Diagnosis not present

## 2021-01-08 DIAGNOSIS — J189 Pneumonia, unspecified organism: Secondary | ICD-10-CM | POA: Diagnosis not present

## 2021-01-08 DIAGNOSIS — Z8673 Personal history of transient ischemic attack (TIA), and cerebral infarction without residual deficits: Secondary | ICD-10-CM | POA: Diagnosis not present

## 2021-01-08 DIAGNOSIS — C61 Malignant neoplasm of prostate: Secondary | ICD-10-CM | POA: Diagnosis not present

## 2021-01-08 DIAGNOSIS — S72011D Unspecified intracapsular fracture of right femur, subsequent encounter for closed fracture with routine healing: Secondary | ICD-10-CM | POA: Diagnosis not present

## 2021-01-08 DIAGNOSIS — M1990 Unspecified osteoarthritis, unspecified site: Secondary | ICD-10-CM | POA: Diagnosis not present

## 2021-01-08 DIAGNOSIS — F482 Pseudobulbar affect: Secondary | ICD-10-CM | POA: Diagnosis not present

## 2021-01-08 DIAGNOSIS — G20 Parkinson's disease: Secondary | ICD-10-CM | POA: Diagnosis not present

## 2021-01-08 DIAGNOSIS — I129 Hypertensive chronic kidney disease with stage 1 through stage 4 chronic kidney disease, or unspecified chronic kidney disease: Secondary | ICD-10-CM | POA: Diagnosis not present

## 2021-01-08 DIAGNOSIS — Z7982 Long term (current) use of aspirin: Secondary | ICD-10-CM | POA: Diagnosis not present

## 2021-01-08 DIAGNOSIS — Z96641 Presence of right artificial hip joint: Secondary | ICD-10-CM | POA: Diagnosis not present

## 2021-01-08 DIAGNOSIS — Z9181 History of falling: Secondary | ICD-10-CM | POA: Diagnosis not present

## 2021-01-08 DIAGNOSIS — I2510 Atherosclerotic heart disease of native coronary artery without angina pectoris: Secondary | ICD-10-CM | POA: Diagnosis not present

## 2021-01-08 DIAGNOSIS — K219 Gastro-esophageal reflux disease without esophagitis: Secondary | ICD-10-CM | POA: Diagnosis not present

## 2021-01-08 DIAGNOSIS — N179 Acute kidney failure, unspecified: Secondary | ICD-10-CM | POA: Diagnosis not present

## 2021-01-08 DIAGNOSIS — E876 Hypokalemia: Secondary | ICD-10-CM | POA: Diagnosis not present

## 2021-01-08 DIAGNOSIS — M1030 Gout due to renal impairment, unspecified site: Secondary | ICD-10-CM | POA: Diagnosis not present

## 2021-01-08 DIAGNOSIS — D62 Acute posthemorrhagic anemia: Secondary | ICD-10-CM | POA: Diagnosis not present

## 2021-01-08 DIAGNOSIS — E43 Unspecified severe protein-calorie malnutrition: Secondary | ICD-10-CM | POA: Diagnosis not present

## 2021-01-11 ENCOUNTER — Encounter: Payer: Self-pay | Admitting: Family Medicine

## 2021-01-11 ENCOUNTER — Ambulatory Visit (INDEPENDENT_AMBULATORY_CARE_PROVIDER_SITE_OTHER): Payer: MEDICARE | Admitting: Family Medicine

## 2021-01-11 ENCOUNTER — Other Ambulatory Visit: Payer: Self-pay

## 2021-01-11 VITALS — BP 116/56 | HR 76 | Temp 97.2°F | Ht 70.0 in | Wt 149.6 lb

## 2021-01-11 DIAGNOSIS — I208 Other forms of angina pectoris: Secondary | ICD-10-CM

## 2021-01-11 DIAGNOSIS — I509 Heart failure, unspecified: Secondary | ICD-10-CM

## 2021-01-11 DIAGNOSIS — J189 Pneumonia, unspecified organism: Secondary | ICD-10-CM | POA: Diagnosis not present

## 2021-01-11 DIAGNOSIS — R601 Generalized edema: Secondary | ICD-10-CM

## 2021-01-11 DIAGNOSIS — I129 Hypertensive chronic kidney disease with stage 1 through stage 4 chronic kidney disease, or unspecified chronic kidney disease: Secondary | ICD-10-CM | POA: Diagnosis not present

## 2021-01-11 DIAGNOSIS — N1832 Chronic kidney disease, stage 3b: Secondary | ICD-10-CM

## 2021-01-11 DIAGNOSIS — D62 Acute posthemorrhagic anemia: Secondary | ICD-10-CM | POA: Diagnosis not present

## 2021-01-11 DIAGNOSIS — G20 Parkinson's disease: Secondary | ICD-10-CM | POA: Diagnosis not present

## 2021-01-11 DIAGNOSIS — D500 Iron deficiency anemia secondary to blood loss (chronic): Secondary | ICD-10-CM

## 2021-01-11 DIAGNOSIS — S72011D Unspecified intracapsular fracture of right femur, subsequent encounter for closed fracture with routine healing: Secondary | ICD-10-CM | POA: Diagnosis not present

## 2021-01-11 DIAGNOSIS — N179 Acute kidney failure, unspecified: Secondary | ICD-10-CM | POA: Diagnosis not present

## 2021-01-11 NOTE — Progress Notes (Signed)
Subjective:  Patient ID: Jon Reid, male    DOB: November 13, 1928  Age: 87 y.o. MRN: [id number]  CC: Follow-up   HPI Jon Reid presents for follow up of his CHF. Seen last week for this and started taking lasix. Now much less swollen. Feeling better. Still weak, especially in the legs. He has renal insufficiency as well. Today he is to have BMP to check on its stability under diuresis.   Depression screen [facility] 2/9 01/05/2021 12/22/2020 12/14/2020  Decreased Interest 0 0 0  Down, Depressed, Hopeless 0 0 0  PHQ - 2 Score 0 0 0  Altered sleeping 0 0 -  Tired, decreased energy 0 1 -  Change in appetite 0 0 -  Feeling bad or failure about yourself  0 1 -  Trouble concentrating 0 1 -  Moving slowly or fidgety/restless 0 0 -  Suicidal thoughts 0 0 -  PHQ-9 Score 0 3 -  Difficult doing work/chores Not difficult at all - -  Some recent data might be hidden    History Tarrell has a past medical history of Arthritis, Carcinoma of prostate ([place]), CKD (chronic kidney disease), stage III ([place]), Coronary artery disease, First degree AV block (01/28/2019), GERD (gastroesophageal reflux disease), Gout, HTN (hypertension), Macrocytosis without anemia (01/08/2012), Moderate aortic stenosis, [name] affect (02/09/2020), 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   He has a past surgical history that includes Retropubic prostatectomy; Lymph node dissection; Cardiac catheterization (11/07/2012); Coronary artery bypass graft (N/A, 11/11/2012); Intraoprative transesophageal echocardiogram (N/A, 11/11/2012); left heart catheterization with coronary angiogram (N/A, 11/07/2012); Inguinal hernia repair (Right, 01/10/2017); Insertion of mesh (Right, 01/10/2017); Cataract extraction w/ intraocular lens implant (Bilateral); Lumbar laminectomy/decompression microdiscectomy (N/A, 02/05/2019); Esophagogastroduodenoscopy (egd) with propofol (N/A,  06/27/2020); Esophagogastroduodenoscopy (egd) with propofol (N/A, 10/03/2020); Balloon dilation (N/A, 10/03/2020); Esophageal brushing (10/03/2020); and Hip Arthroplasty (Right, 11/13/2020).   His family history includes Aneurysm in his father; Cancer in his mother; Hypertension in his father; Parkinson's disease in his son; Stroke in his father.He reports that he has never smoked. He has never used smokeless tobacco. He reports current alcohol use. He reports that he does not use drugs.    ROS Review of Systems  Constitutional:  Negative for fever.  Respiratory:  Positive for shortness of breath.   Cardiovascular:  Negative for chest pain.  Musculoskeletal:  Negative for arthralgias.  Skin:  Negative for rash.  Neurological:  Positive for weakness (nonfocal).   Objective:  BP (!) 116/56   Pulse 76   Temp (!) 97.2 F (36.2 C)   Ht 5' 10"  (1.778 m)   Wt 149 lb 9.6 oz (67.9 kg)   SpO2 100%   BMI 21.47 kg/m   BP Readings from Last 3 Encounters:  01/11/21 (!) 116/56  01/05/21 135/80  12/22/20 139/71    Wt Readings from Last 3 Encounters:  01/11/21 149 lb 9.6 oz (67.9 kg)  01/05/21 158 lb (71.7 kg)  12/22/20 156 lb (70.8 kg)     Physical Exam Vitals reviewed.  Constitutional:      Appearance: He is well-developed.  HENT:     Head: Normocephalic and atraumatic.     Right Ear: External ear normal.     Left Ear: External ear normal.     Mouth/Throat:     Pharynx: No oropharyngeal exudate or posterior oropharyngeal erythema.  Eyes:     Pupils: Pupils are equal, round, and reactive to light.  Cardiovascular:     Rate and Rhythm: Normal rate and regular rhythm.     Heart sounds: No murmur heard. Pulmonary:     Effort: No respiratory distress.     Breath sounds: Normal breath sounds.  Musculoskeletal:     Cervical back: Normal range of motion and neck supple.     Right lower leg: Right lower leg edema: 2+.     Left lower leg: Edema (2+) present.  Neurological:     Mental  Status: He is alert and oriented to person, place, and time.      Assessment & Plan:   Jon Reid was seen today for follow-up.  Diagnoses and all orders for this visit:  Generalized edema -     CBC with Differential/Platelet  Chronic renal impairment, stage 3b (HCC) -     CBC with Differential/Platelet  Acute congestive heart failure, unspecified heart failure type ([place]) -     CMP14+EGFR -     CBC with Differential/Platelet  Iron deficiency anemia due to chronic blood loss      I am having Jon Reid maintain his fluticasone, nitroGLYCERIN, Vitamin D, ascorbic acid, triamcinolone cream, carbidopa-levodopa, Dextromethorphan-guaiFENesin, docusate sodium, melatonin, pantoprazole, senna-docusate, CertaVite/Antioxidants, aspirin, Iron (Ferrous Sulfate), potassium chloride SA, and furosemide.  Allergies as of 01/11/2021       Reactions   Crestor [rosuvastatin] Swelling   GYNECOMASTIA    Trazodone And Nefazodone    Hallucinations        Medication List        Accurate as of January 11, 2021 11:59 PM. If you have any questions, ask your nurse or doctor.          ascorbic acid 500 MG tablet Commonly known as: VITAMIN C Take 1 tablet (500 mg total) by mouth daily.   Aspirin Low Dose 81 MG EC tablet Generic drug: aspirin Take 1 tablet (81 mg total) by mouth daily. Swallow whole.   carbidopa-levodopa 25-100 MG tablet Commonly known as: SINEMET IR Take 1.5 tablets by mouth 3 (three) times daily.   CertaVite/Antioxidants Tabs Take 1 tablet by mouth daily.   docusate sodium 100 MG capsule Commonly known as: COLACE Take 1 capsule (100 mg total) by mouth 2 (two) times daily.   fluticasone 50 MCG/ACT nasal spray Commonly known as: FLONASE One to 2 sprays each nostril at bedtime What changed:  how much to take how to take this when to take this additional instructions   furosemide 40 MG tablet Commonly known as: LASIX Take 1 tablet (40 mg total) by mouth  daily. For swelling   Iron (Ferrous Sulfate) 325 (65 Fe) MG Tabs Take 1 tablet by mouth daily.   melatonin 5 MG Tabs Take 1 tablet (5 mg total) by mouth at bedtime.   nitroGLYCERIN 0.4 MG SL tablet Commonly known as: NITROSTAT Place 1 tablet (0.4 mg total) under the tongue every 5 (five) minutes as needed for chest pain.   pantoprazole 40 MG tablet Commonly known as: PROTONIX Take 1 tablet (40 mg total) by mouth 2 (two) times daily.   potassium chloride SA 20 MEQ tablet Commonly known as: KLOR-CON Take 1 tablet (20 mEq total) by mouth daily. For potassium replacement/ supplement   Senexon-S 8.6-50 MG tablet Generic drug: senna-docusate Take 2 tablets by mouth daily with supper.   SM Tussin DM 10-100 MG/5ML liquid Generic drug: Dextromethorphan-guaiFENesin Take 5 mLs by mouth every 6 (six) hours.   triamcinolone cream 0.1 % Commonly known as: KENALOG Apply 1  application topically daily as needed (itching).   Vitamin D 50 MCG (2000 UT) Caps Take 2,000 Units by mouth daily.         Follow-up: Return in about 2 weeks (around 01/25/2021).  Claretta Fraise, M.D.

## 2021-01-12 ENCOUNTER — Ambulatory Visit: Payer: MEDICARE | Attending: Family Medicine

## 2021-01-12 ENCOUNTER — Other Ambulatory Visit: Payer: Self-pay

## 2021-01-12 DIAGNOSIS — M6281 Muscle weakness (generalized): Secondary | ICD-10-CM | POA: Insufficient documentation

## 2021-01-12 DIAGNOSIS — R2681 Unsteadiness on feet: Secondary | ICD-10-CM | POA: Insufficient documentation

## 2021-01-12 DIAGNOSIS — M25651 Stiffness of right hip, not elsewhere classified: Secondary | ICD-10-CM | POA: Diagnosis not present

## 2021-01-12 DIAGNOSIS — Z96649 Presence of unspecified artificial hip joint: Secondary | ICD-10-CM | POA: Insufficient documentation

## 2021-01-12 DIAGNOSIS — S72001A Fracture of unspecified part of neck of right femur, initial encounter for closed fracture: Secondary | ICD-10-CM | POA: Diagnosis not present

## 2021-01-12 DIAGNOSIS — R54 Age-related physical debility: Secondary | ICD-10-CM | POA: Diagnosis not present

## 2021-01-12 NOTE — Therapy (Signed)
[facility] [place], [place], [address] Phone: [phone]   Fax:  [phone]  Physical Therapy Evaluation  Patient Details  Name: Jon Reid MRN: [id number] Date of Birth: [birth year] Referring Provider (PT): Stacks   Encounter Date: 01/12/2021   PT End of Session - 01/12/21 1023     Visit Number 1    Number of Visits 12    Date for PT Re-Evaluation 03/03/21    PT Start Time 1031    PT Stop Time 1120    PT Time Calculation (min) 49 min    Activity Tolerance Patient tolerated treatment well    Behavior During Therapy [facility] for tasks assessed/performed             Past Medical History:  Diagnosis Date   Arthritis    Carcinoma of prostate ([place])    prostate   CKD (chronic kidney disease), stage III ([place])    Coronary artery disease    a. 10/2012 Cath: LM 20-30d, lAD 80p, 55m, 80-90d, LCX 90-34m, OM1 90, RCA 50p, 71m, 50d, RPDA 90, EF nl; b. 10/2012 CABG x 4 (LIMA-LAD, SVG-OM1, SVG-PDA->RPL); c. 07/2015 MV: Basal and proximal septal infarct w/o ischemia. EF 57%-->low risk.   First degree AV block 01/28/2019   Noted on EKG   GERD (gastroesophageal reflux disease)    Gout    HTN (hypertension)    Macrocytosis without anemia 01/08/2012   Moderate aortic stenosis    a. 10/2012 Echo: EF 55-60%, no rwma, BAE, mild AI, mild TR; b. 06/2020 Echo: EF 50-55%, no rwma, mild LVH. Mod dil LA. Mild MR. Mod AS.   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   Sleep apnea    Questionable   Spinal stenosis    Stroke [facility])     Past Surgical History:  Procedure Laterality Date   BALLOON DILATION N/A 10/03/2020   Procedure: BALLOON DILATION;  Surgeon: [name], DO;  [facility];  Service: Endoscopy;  Laterality: N/A;   CARDIAC CATHETERIZATION  11/07/2012   Dr Acie Fredrickson   CATARACT EXTRACTION W/ INTRAOCULAR LENS IMPLANT Bilateral    CORONARY  ARTERY BYPASS GRAFT N/A 11/11/2012   Procedure: CORONARY ARTERY BYPASS GRAFTING times four using Right Greater Saphenous Vein Graft harvested endoscopically and Left Internal Mammary Artery.;  Surgeon: Ivin Poot, MD;  Location: [place];  Service: Open Heart Surgery;  Laterality: N/A;   ESOPHAGEAL BRUSHING  10/03/2020   Procedure: ESOPHAGEAL BRUSHING;  Surgeon: [name], DO;  [facility];  Service: Endoscopy;;   ESOPHAGOGASTRODUODENOSCOPY (EGD) WITH PROPOFOL N/A 06/27/2020   Surgeon: [name], DO;  large hiatal hernia, LA grade D esophagitis with no bleeding, normal examined duodenum.    ESOPHAGOGASTRODUODENOSCOPY (EGD) WITH PROPOFOL N/A 10/03/2020   Procedure: ESOPHAGOGASTRODUODENOSCOPY (EGD) WITH PROPOFOL;  Surgeon: [name], DO;  [facility];  Service: Endoscopy;  Laterality: N/A;  2:00pm   HIP ARTHROPLASTY Right 11/13/2020   Procedure: ARTHROPLASTY BIPOLAR HIP (HEMIARTHROPLASTY);  Surgeon: Renette Butters, MD;  Location: [place];  Service: Orthopedics;  Laterality: Right;   INGUINAL HERNIA REPAIR Right 01/10/2017   Procedure: OPEN RIGHT HERNIA REPAIR INGUINAL;  Surgeon: Ileana Roup, MD;  Location: [facility];  Service: General;  Laterality: Right;   INSERTION OF MESH Right 01/10/2017   Procedure: INSERTION OF MESH;  Surgeon: Ileana Roup, MD;  [facility];  Service: General;  Laterality: Right;   INTRAOPERATIVE TRANSESOPHAGEAL ECHOCARDIOGRAM N/A 11/11/2012   Procedure: INTRAOPERATIVE TRANSESOPHAGEAL ECHOCARDIOGRAM;  Surgeon: Ivin Poot, MD;  Location: [place];  Service: Open Heart Surgery;  Laterality: N/A;   LEFT HEART CATHETERIZATION WITH CORONARY ANGIOGRAM N/A 11/07/2012   Procedure: LEFT HEART CATHETERIZATION WITH CORONARY ANGIOGRAM;  Surgeon: Thayer Headings, MD;  Location: [facility];  Service: Cardiovascular;  Laterality: N/A;   LUMBAR LAMINECTOMY/DECOMPRESSION MICRODISCECTOMY N/A 02/05/2019   Procedure: LUMBAR  LAMINECTOMY/DECOMPRESSION L3-L4;  Surgeon: Latanya Maudlin, MD;  Location[facility];  Service: Orthopedics;  Laterality: N/A;  17min   LYMPH NODE DISSECTION     Bilateral pelvic   RETROPUBIC PROSTATECTOMY     Radical    There were no vitals filed for this visit.    Subjective Assessment - 01/12/21 1022     Subjective Patient reports that he fell aound the end of August. He notes that he was "just talking to someone when the next thing he know I was on the ground." He had a right hip hemiarthroplasty on 11/13/20. He notes that he is getting stronger everyday, but his right knee is sore. He notes that he gets dizzy while standing and walking. He notes that he has not fallen since his surgery.    Pertinent History history of multiple falls, HTN, history of a stroke    Limitations Walking    How long can you stand comfortably? about 30 minutes    How long can you walk comfortably? about 30 minutes    Patient Stated Goals exercise more    Currently in Pain? Yes    Pain Score 5     Pain Location Hip    Pain Orientation Right    Pain Descriptors / Indicators Sore    Pain Type Surgical pain    Pain Radiating Towards right knee    Pain Onset More than a month ago    Pain Frequency Intermittent    Aggravating Factors  walking    Pain Relieving Factors none reported    Effect of Pain on Daily Activities limited with standing and walking activities                [facility] PT Assessment - 01/12/21 0001       Assessment   Medical Diagnosis Right Hip Hemiarthroplasty    Referring Provider (PT) Stacks    Onset Date/Surgical Date 11/13/20    Next MD Visit 01/18/21    Prior Therapy Yes      Precautions   Precautions Fall    Precaution Comments He reports that he was not given any precautions      Balance Screen   Has the patient fallen in the past 6 months Yes    How many times? 6    Has the patient had a decrease in activity level because of a fear of falling?  No    Is the patient  reluctant to leave their home because of a fear of falling?  No      Home Environment   Living Environment Private residence    Living Arrangements Spouse/significant other    Type of [place] to enter    Entrance Stairs-Number of Steps 1    [place] Two level    Alternate Level Stairs-Number of Steps 6-12    Alternate [place] - 2 wheels  Prior Function   Level of Independence Independent with basic ADLs    Vocation Retired      Associate Professor   Overall Cognitive Status History of cognitive impairments - at baseline    Attention Selective    Selective Attention Impaired    Memory Impaired    Awareness Impaired    Problem Solving Impaired      Observation/Other Assessments   Observations Mild swelling throughout BLE      Sensation   Additional Comments Patient reports no numbness or tingling      ROM / Strength   AROM / PROM / Strength Strength;AROM      AROM   AROM Assessment Site Hip    Right/Left Hip Right    Right Hip Flexion 76      Strength   Strength Assessment Site Hip;Knee    Right/Left Hip Left;Right    Right Hip Flexion 3+/5    Left Hip Flexion 4-/5    Right/Left Knee Right;Left    Right Knee Flexion 3/5    Right Knee Extension 3+/5    Left Knee Flexion 4+/5    Left Knee Extension 4+/5      Palpation   SI assessment  --    Palpation comment Patient reports no tenderness to palpation      Transfers   Transfers Sit to Stand    Sit to Stand 4: Min guard      Ambulation/Gait   Ambulation/Gait Yes    Ambulation/Gait Assistance 4: Min guard    Assistive device Rolling walker    Gait Pattern Step-to pattern;Decreased step length - right;Decreased step length - left;Decreased stride length;Decreased hip/knee flexion - right;Decreased hip/knee flexion - left;Decreased dorsiflexion - right;Decreased dorsiflexion - left;Left foot flat;Right foot flat;Shuffle;Poor foot clearance - left;Poor  foot clearance - right    Ambulation Surface Level;Indoor    Gait Comments required cuing to have all 4 legs of the walker on the floor as he wanted to only utilize the front two wheels on the floor                        Objective measurements completed on examination: See above findings.                     PT Long Term Goals - 01/12/21 1244       PT LONG TERM GOAL #1   Title Patient will be independent with his HEP.    Time 6    Period Weeks    Status New    Target Date 02/23/21      PT LONG TERM GOAL #2   Title Patient will improve his right hip MMT to at least a 4/5 for improved hip strength.    Baseline 3+/5    Time 6    Period Weeks    Status New    Target Date 02/23/21      PT LONG TERM GOAL #3   Title Patient will be able to safely transfer from sitting to standing with minimal upper extremity support.    Baseline moderate difficulty    Time 6    Period Weeks    Status New    Target Date 02/23/21                    Plan - 01/12/21 1023     Clinical Impression Statement Patient is a 87 year old male presenting to physical therapy  following a right hip hemiarthroplasty on 11/13/20. He exhibits decreased range of motion and strength in the right hip and lower extremity. He also remains a high fall risk due to his history of falls, gait mechanics, utilization of his walker. Recommend that he continue with his recommended plan of care to address his impairments to maximize his functional mobility.    Personal Factors and Comorbidities Age;Fitness;Time since onset of injury/illness/exacerbation;Transportation    Examination-Activity Limitations Locomotion Level;Transfers;Dressing;Stand    Examination-Participation Restrictions Other    Stability/Clinical Decision Making Evolving/Moderate complexity    Clinical Decision Making Moderate    Rehab Potential Fair    PT Frequency 2x / week    PT Duration 6 weeks    PT  Treatment/Interventions ADLs/Self Care Home Management;Electrical Stimulation;Moist Heat;Gait training;Stair training;Functional mobility training;Therapeutic activities;Therapeutic exercise;Balance training;Neuromuscular re-education;Manual techniques;Patient/family education;Passive range of motion;Energy conservation    PT Next Visit Plan DOS: 11/13/20; Nustep, lower extremity strengthening, gait training with walker    Consulted and Agree with Plan of Care Patient             Patient will benefit from skilled therapeutic intervention in order to improve the following deficits and impairments:  Abnormal gait, Difficulty walking, Decreased range of motion, Decreased safety awareness, Decreased activity tolerance, Pain, Decreased balance, Decreased strength, Postural dysfunction  Visit Diagnosis: Unsteadiness on feet  Muscle weakness (generalized)  Stiffness of right hip, not elsewhere classified     Problem List Patient Active Problem List   Diagnosis Date Noted   Bifascicular block 12/20/2020   Cough 12/12/2020   AKI (acute kidney injury) ([place])    Hypokalemia    Acute on chronic anemia    Protein-calorie malnutrition, severe 11/26/2020   Pressure injury of skin 11/17/2020   Malnutrition of moderate degree 11/15/2020   S/P hip hemiarthroplasty    Closed right hip fracture, initial encounter ([place])    Femur fracture, right ([place]) 11/11/2020   Frail elderly 11/08/2020   Dizziness 09/23/2020   History of GI bleed 08/19/2020   Loss of weight 08/19/2020   Dysphagia 08/19/2020   Upper GI bleed 06/25/2020   COVID-19 virus infection 06/25/2020   [name] affect 02/09/2020   Iron deficiency anemia due to chronic blood loss 05/27/2019   Nonrheumatic mitral valve regurgitation 05/26/2019   Left anterior fascicular block 05/26/2019   Goals of care, counseling/discussion 04/06/2019   Constipation 02/19/2019   Elevated troponin 02/09/2019   Fecal occult blood test positive  02/09/2019   Spinal stenosis, lumbar region with neurogenic claudication 02/05/2019   Leg swelling 01/26/2019   Nonrheumatic aortic valve stenosis 01/26/2019   Multiple joint pain 12/11/2018   Chronic pain of both knees 11/07/2018   Vitamin D deficiency 10/23/2018   Gastroesophageal reflux disease with esophagitis 03/13/2018   Murmur, cardiac 12/04/2017   Coronary artery disease involving native coronary artery of native heart without angina pectoris 12/04/2017   PVC's (premature ventricular contractions) 12/04/2017   Heart disease 08/21/2017   Weakness 11/14/2016   Abnormality of gait 12/06/2015   Abdominal aortic atherosclerosis ([place]) 12/01/2015   Degeneration of lumbar intervertebral disc 12/01/2015   Exertional angina ([place]) 07/20/2015   Hernia of abdominal wall 10/12/2014   Gynecomastia 04/09/2013   Hx of CABG 02/02/2013   Myelodysplastic syndrome 11/06/2012   Vitamin B12 deficiency 07/07/2012   Hyperlipemia 07/07/2012   Peripheral vascular disease ([place]) 04/14/2009   CARCINOMA, PROSTATE 04/13/2009   Non-thrombocytopenic purpura ([place]) 04/13/2009   Benign essential hypertension 04/13/2009   Chronic renal insufficiency, stage III (moderate) (  [place]) 04/13/2009   SLEEP APNEA 04/13/2009    Darlin Coco, PT 01/12/2021, 12:55 PM  [facility] [address] [place], [place], [address] Phone: [phone]   Fax:  [phone]  Name: Jon Reid MRN: [id number] Date of Birth: 25-Jun-1928

## 2021-01-14 ENCOUNTER — Encounter: Payer: Self-pay | Admitting: Family Medicine

## 2021-01-17 ENCOUNTER — Ambulatory Visit: Payer: MEDICARE | Attending: Family Medicine | Admitting: Physical Therapy

## 2021-01-17 ENCOUNTER — Other Ambulatory Visit: Payer: Self-pay

## 2021-01-17 ENCOUNTER — Encounter: Payer: Self-pay | Admitting: Physical Therapy

## 2021-01-17 DIAGNOSIS — M6281 Muscle weakness (generalized): Secondary | ICD-10-CM | POA: Insufficient documentation

## 2021-01-17 DIAGNOSIS — M25651 Stiffness of right hip, not elsewhere classified: Secondary | ICD-10-CM | POA: Insufficient documentation

## 2021-01-17 DIAGNOSIS — R2681 Unsteadiness on feet: Secondary | ICD-10-CM | POA: Insufficient documentation

## 2021-01-17 NOTE — Therapy (Signed)
[facility] [place], [place], [address] Phone: [phone]   Fax:  [phone]  Physical Therapy Treatment  Patient Details  Name: Jon Reid MRN: [id number] Date of Birth: 08-Nov-1928 Referring Provider (PT): Stacks   Encounter Date: 01/17/2021   PT End of Session - 01/17/21 1038     Visit Number 2    Number of Visits 12    Date for PT Re-Evaluation 03/03/21    PT Start Time 1036    PT Stop Time 1115    PT Time Calculation (min) 39 min    Equipment Utilized During Treatment Other (comment)   FWW   Activity Tolerance Patient tolerated treatment well    Behavior During Therapy [facility] for tasks assessed/performed             Past Medical History:  Diagnosis Date   Arthritis    Carcinoma of prostate ([place])    prostate   CKD (chronic kidney disease), stage III ([place])    Coronary artery disease    a. 10/2012 Cath: LM 20-30d, lAD 80p, 60m, 80-90d, LCX 90-38m, OM1 90, RCA 50p, 24m, 50d, RPDA 90, EF nl; b. 10/2012 CABG x 4 (LIMA-LAD, SVG-OM1, SVG-PDA->RPL); c. 07/2015 MV: Basal and proximal septal infarct w/o ischemia. EF 57%-->low risk.   First degree AV block 01/28/2019   Noted on EKG   GERD (gastroesophageal reflux disease)    Gout    HTN (hypertension)    Macrocytosis without anemia 01/08/2012   Moderate aortic stenosis    a. 10/2012 Echo: EF 55-60%, no rwma, BAE, mild AI, mild TR; b. 06/2020 Echo: EF 50-55%, no rwma, mild LVH. Mod dil LA. Mild MR. Mod AS.   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   Sleep apnea    Questionable   Spinal stenosis    Stroke [facility])     Past Surgical History:  Procedure Laterality Date   BALLOON DILATION N/A 10/03/2020   Procedure: BALLOON DILATION;  Surgeon: [name], DO;  [facility];  Service: Endoscopy;  Laterality: N/A;   CARDIAC CATHETERIZATION  11/07/2012   Dr Acie Fredrickson   CATARACT  EXTRACTION W/ INTRAOCULAR LENS IMPLANT Bilateral    CORONARY ARTERY BYPASS GRAFT N/A 11/11/2012   Procedure: CORONARY ARTERY BYPASS GRAFTING times four using Right Greater Saphenous Vein Graft harvested endoscopically and Left Internal Mammary Artery.;  Surgeon: Ivin Poot, MD;  Location: [place];  Service: Open Heart Surgery;  Laterality: N/A;   ESOPHAGEAL BRUSHING  10/03/2020   Procedure: ESOPHAGEAL BRUSHING;  Surgeon: [name], DO;  [facility];  Service: Endoscopy;;   ESOPHAGOGASTRODUODENOSCOPY (EGD) WITH PROPOFOL N/A 06/27/2020   Surgeon: [name], DO;  large hiatal hernia, LA grade D esophagitis with no bleeding, normal examined duodenum.    ESOPHAGOGASTRODUODENOSCOPY (EGD) WITH PROPOFOL N/A 10/03/2020   Procedure: ESOPHAGOGASTRODUODENOSCOPY (EGD) WITH PROPOFOL;  Surgeon: [name], DO;  [facility];  Service: Endoscopy;  Laterality: N/A;  2:00pm   HIP ARTHROPLASTY Right 11/13/2020   Procedure: ARTHROPLASTY BIPOLAR HIP (HEMIARTHROPLASTY);  Surgeon: Renette Butters, MD;  Location: [place];  Service: Orthopedics;  Laterality: Right;   INGUINAL HERNIA REPAIR Right 01/10/2017   Procedure: OPEN RIGHT HERNIA REPAIR INGUINAL;  Surgeon: Ileana Roup, MD;  Location: [facility];  Service: General;  Laterality: Right;   INSERTION OF MESH Right 01/10/2017  Procedure: INSERTION OF MESH;  Surgeon: Ileana Roup, MD;  [facility]: [facility];  Service: General;  Laterality: Right;   INTRAOPERATIVE TRANSESOPHAGEAL ECHOCARDIOGRAM N/A 11/11/2012   Procedure: INTRAOPERATIVE TRANSESOPHAGEAL ECHOCARDIOGRAM;  Surgeon: Ivin Poot, MD;  Location: [place];  Service: Open Heart Surgery;  Laterality: N/A;   LEFT HEART CATHETERIZATION WITH CORONARY ANGIOGRAM N/A 11/07/2012   Procedure: LEFT HEART CATHETERIZATION WITH CORONARY ANGIOGRAM;  Surgeon: Thayer Headings, MD;  Location: [facility];  Service: Cardiovascular;  Laterality: N/A;   LUMBAR  LAMINECTOMY/DECOMPRESSION MICRODISCECTOMY N/A 02/05/2019   Procedure: LUMBAR LAMINECTOMY/DECOMPRESSION L3-L4;  Surgeon: Latanya Maudlin, MD;  Location: [facility];  Service: Orthopedics;  Laterality: N/A;  71min   LYMPH NODE DISSECTION     Bilateral pelvic   RETROPUBIC PROSTATECTOMY     Radical    There were no vitals filed for this visit.   Subjective Assessment - 01/17/21 1037     Subjective Reports no hip pain but did hurt some after last visit.    Pertinent History history of multiple falls, HTN, history of a stroke    Limitations Walking    How long can you stand comfortably? about 30 minutes    How long can you walk comfortably? about 30 minutes    Patient Stated Goals exercise more    Currently in Pain? No/denies                [facility] PT Assessment - 01/17/21 0001       Assessment   Medical Diagnosis Right Hip Hemiarthroplasty    Referring Provider (PT) Stacks    Onset Date/Surgical Date 11/13/20    Next MD Visit 01/18/21    Prior Therapy Yes      Precautions   Precautions Fall    Precaution Comments He reports that he was not given any precautions                           [facility] Adult PT Treatment/Exercise - 01/17/21 0001       Exercises   Exercises Knee/Hip      Knee/Hip Exercises: Aerobic   Nustep L2 x15 min      Knee/Hip Exercises: Standing   Forward Step Up Right;15 reps;Hand Hold: 2;Step Height: 4"    Other Standing Knee Exercises sidestepping x4 RT    Other Standing Knee Exercises Weightshifting lateral on airex x2 min      Knee/Hip Exercises: Seated   Long Arc Quad Strengthening;Right;15 reps;Weights    Long Arc Quad Weight 2 lbs.    Ball Squeeze x2 min    Other Seated Knee/Hip Exercises B heel/toe raise x20 reps    Marching AROM;Right;2 sets;10 reps    Hamstring Curl Strengthening;Right;2 sets;10 reps;Limitations    Hamstring Limitations yellow theraband    Abduction/Adduction  Strengthening;Both;20 reps;Limitations     Abd/Adduction Limitations yellow theraband; step outs    Sit to Sand 15 reps;with UE support   VCs for hand placement for suppport                         PT Long Term Goals - 01/12/21 1244       PT LONG TERM GOAL #1   Title Patient will be independent with his HEP.    Time 6    Period Weeks    Status New    Target Date 02/23/21      PT LONG TERM GOAL #2  Title Patient will improve his right hip MMT to at least a 4/5 for improved hip strength.    Baseline 3+/5    Time 6    Period Weeks    Status New    Target Date 02/23/21      PT LONG TERM GOAL #3   Title Patient will be able to safely transfer from sitting to standing with minimal upper extremity support.    Baseline moderate difficulty    Time 6    Period Weeks    Status New    Target Date 02/23/21                   Plan - 01/17/21 1126     Clinical Impression Statement Patient presented in clinic with no complaints via hip. Patient observed ambulating with FWW with full contact of all four feet. Patient able to tolerate therex but hip flexor and quad weakness noted. Hip abductor weakness noted as well with trunk rotation used as compensatory strategies.    Personal Factors and Comorbidities Age;Fitness;Time since onset of injury/illness/exacerbation;Transportation    Examination-Activity Limitations Locomotion Level;Transfers;Dressing;Stand    Examination-Participation Restrictions Other    Stability/Clinical Decision Making Evolving/Moderate complexity    Rehab Potential Fair    PT Frequency 2x / week    PT Duration 6 weeks    PT Treatment/Interventions ADLs/Self Care Home Management;Electrical Stimulation;Moist Heat;Gait training;Stair training;Functional mobility training;Therapeutic activities;Therapeutic exercise;Balance training;Neuromuscular re-education;Manual techniques;Patient/family education;Passive range of motion;Energy conservation    PT Next Visit Plan DOS: 11/13/20; Nustep,  lower extremity strengthening, gait training with walker    Consulted and Agree with Plan of Care Patient             Patient will benefit from skilled therapeutic intervention in order to improve the following deficits and impairments:  Abnormal gait, Difficulty walking, Decreased range of motion, Decreased safety awareness, Decreased activity tolerance, Pain, Decreased balance, Decreased strength, Postural dysfunction  Visit Diagnosis: Unsteadiness on feet  Muscle weakness (generalized)  Stiffness of right hip, not elsewhere classified     Problem List Patient Active Problem List   Diagnosis Date Noted   Bifascicular block 12/20/2020   Cough 12/12/2020   AKI (acute kidney injury) ([place])    Hypokalemia    Acute on chronic anemia    Protein-calorie malnutrition, severe 11/26/2020   Pressure injury of skin 11/17/2020   Malnutrition of moderate degree 11/15/2020   S/P hip hemiarthroplasty    Closed right hip fracture, initial encounter ([place])    Femur fracture, right ([place]) 11/11/2020   Frail elderly 11/08/2020   Dizziness 09/23/2020   History of GI bleed 08/19/2020   Loss of weight 08/19/2020   Dysphagia 08/19/2020   Upper GI bleed 06/25/2020   COVID-19 virus infection 06/25/2020   [name] affect 02/09/2020   Iron deficiency anemia due to chronic blood loss 05/27/2019   Nonrheumatic mitral valve regurgitation 05/26/2019   Left anterior fascicular block 05/26/2019   Goals of care, counseling/discussion 04/06/2019   Constipation 02/19/2019   Elevated troponin 02/09/2019   Fecal occult blood test positive 02/09/2019   Spinal stenosis, lumbar region with neurogenic claudication 02/05/2019   Leg swelling 01/26/2019   Nonrheumatic aortic valve stenosis 01/26/2019   Multiple joint pain 12/11/2018   Chronic pain of both knees 11/07/2018   Vitamin D deficiency 10/23/2018   Gastroesophageal reflux disease with esophagitis 03/13/2018   Murmur, cardiac 12/04/2017    Coronary artery disease involving native coronary artery of native heart without angina pectoris 12/04/2017  PVC's (premature ventricular contractions) 12/04/2017   Heart disease 08/21/2017   Weakness 11/14/2016   Abnormality of gait 12/06/2015   Abdominal aortic atherosclerosis ([place]) 12/01/2015   Degeneration of lumbar intervertebral disc 12/01/2015   Exertional angina ([place]) 07/20/2015   Hernia of abdominal wall 10/12/2014   Gynecomastia 04/09/2013   Hx of CABG 02/02/2013   Myelodysplastic syndrome 11/06/2012   Vitamin B12 deficiency 07/07/2012   Hyperlipemia 07/07/2012   Peripheral vascular disease ([place]) 04/14/2009   CARCINOMA, PROSTATE 04/13/2009   Non-thrombocytopenic purpura ([place]) 04/13/2009   Benign essential hypertension 04/13/2009   Chronic renal insufficiency, stage III (moderate) ([place]) 04/13/2009   SLEEP APNEA 04/13/2009    Standley Brooking, PTA 01/17/2021, 11:29 AM  [facility] [address] [place], [place], [address] Phone: [phone]   Fax:  [phone]  Name: Jon Reid MRN: [id number] Date of Birth: 03-08-1929

## 2021-01-18 ENCOUNTER — Encounter: Payer: Self-pay | Admitting: Family Medicine

## 2021-01-18 ENCOUNTER — Ambulatory Visit (INDEPENDENT_AMBULATORY_CARE_PROVIDER_SITE_OTHER): Payer: MEDICARE | Admitting: Family Medicine

## 2021-01-18 VITALS — BP 148/62 | HR 78 | Temp 97.2°F | Ht 70.0 in | Wt 147.8 lb

## 2021-01-18 DIAGNOSIS — M7989 Other specified soft tissue disorders: Secondary | ICD-10-CM | POA: Diagnosis not present

## 2021-01-18 DIAGNOSIS — I129 Hypertensive chronic kidney disease with stage 1 through stage 4 chronic kidney disease, or unspecified chronic kidney disease: Secondary | ICD-10-CM | POA: Diagnosis not present

## 2021-01-18 DIAGNOSIS — D62 Acute posthemorrhagic anemia: Secondary | ICD-10-CM | POA: Diagnosis not present

## 2021-01-18 DIAGNOSIS — N179 Acute kidney failure, unspecified: Secondary | ICD-10-CM | POA: Diagnosis not present

## 2021-01-18 DIAGNOSIS — I208 Other forms of angina pectoris: Secondary | ICD-10-CM | POA: Diagnosis not present

## 2021-01-18 DIAGNOSIS — J189 Pneumonia, unspecified organism: Secondary | ICD-10-CM | POA: Diagnosis not present

## 2021-01-18 DIAGNOSIS — S72011D Unspecified intracapsular fracture of right femur, subsequent encounter for closed fracture with routine healing: Secondary | ICD-10-CM | POA: Diagnosis not present

## 2021-01-18 DIAGNOSIS — G20 Parkinson's disease: Secondary | ICD-10-CM | POA: Diagnosis not present

## 2021-01-18 MED ORDER — POTASSIUM CHLORIDE CRYS ER 10 MEQ PO TBCR: 20.0000 meq | EXTENDED_RELEASE_TABLET | Freq: Every day | ORAL | 0 refills | Status: AC

## 2021-01-18 MED ORDER — FUROSEMIDE 40 MG PO TABS: 40.0000 mg | ORAL_TABLET | Freq: Two times a day (BID) | ORAL | 2 refills | Status: AC

## 2021-01-18 NOTE — Progress Notes (Signed)
Subjective:  Patient ID: Jon Reid, male    DOB: 1929-03-07  Age: 87 y.o. MRN: [id number]  CC: Follow-up   HPI Jon Reid presents for recheck of edema and renal function due to ongoing diuresis for CHF. Pt. States his legs still feel weak. He denies dyspnea and chest pain. There is still swelling in the legs but it is decreasing.    Depression screen [facility] 2/9 01/18/2021 01/05/2021 12/22/2020  Decreased Interest 0 0 0  Down, Depressed, Hopeless 0 0 0  PHQ - 2 Score 0 0 0  Altered sleeping - 0 0  Tired, decreased energy - 0 1  Change in appetite - 0 0  Feeling bad or failure about yourself  - 0 1  Trouble concentrating - 0 1  Moving slowly or fidgety/restless - 0 0  Suicidal thoughts - 0 0  PHQ-9 Score - 0 3  Difficult doing work/chores - Not difficult at all -  Some recent data might be hidden    History Jon Reid has a past medical history of Arthritis, Carcinoma of prostate ([place]), CKD (chronic kidney disease), stage III ([place]), Coronary artery disease, First degree AV block (01/28/2019), GERD (gastroesophageal reflux disease), Gout, HTN (hypertension), Macrocytosis without anemia (01/08/2012), Moderate aortic stenosis, [name] affect (02/09/2020), Right BBB/left ant fasc block (01/28/2019), S/P CABG x 4 (02/02/2013), Sleep apnea, Spinal stenosis, and Stroke ([place]).   He has a past surgical history that includes Retropubic prostatectomy; Lymph node dissection; Cardiac catheterization (11/07/2012); Coronary artery bypass graft (N/A, 11/11/2012); Intraoprative transesophageal echocardiogram (N/A, 11/11/2012); left heart catheterization with coronary angiogram (N/A, 11/07/2012); Inguinal hernia repair (Right, 01/10/2017); Insertion of mesh (Right, 01/10/2017); Cataract extraction w/ intraocular lens implant (Bilateral); Lumbar laminectomy/decompression microdiscectomy (N/A, 02/05/2019); Esophagogastroduodenoscopy (egd) with propofol (N/A, 06/27/2020); Esophagogastroduodenoscopy (egd) with  propofol (N/A, 10/03/2020); Balloon dilation (N/A, 10/03/2020); Esophageal brushing (10/03/2020); and Hip Arthroplasty (Right, 11/13/2020).   His family history includes Aneurysm in his father; Cancer in his mother; Hypertension in his father; Parkinson's disease in his son; Stroke in his father.He reports that he has never smoked. He has never used smokeless tobacco. He reports current alcohol use. He reports that he does not use drugs.    ROS Review of Systems  Objective:  BP (!) 148/62   Pulse 78   Temp (!) 97.2 F (36.2 C)   Ht _0  (1.778 m)   Wt 147 lb 12.8 oz (67 kg)   SpO2 98%   BMI 21.21 kg/m   BP Readings from Last 3 Encounters:  01/18/21 (!) 148/62  01/11/21 (!) 116/56  01/05/21 135/80    Wt Readings from Last 3 Encounters:  01/18/21 147 lb 12.8 oz (67 kg)  01/11/21 149 lb 9.6 oz (67.9 kg)  01/05/21 158 lb (71.7 kg)     Physical Exam Vitals reviewed.  Constitutional:      Appearance: He is well-developed.  HENT:     Head: Normocephalic and atraumatic.     Right Ear: External ear normal.     Left Ear: External ear normal.     Mouth/Throat:     Pharynx: No oropharyngeal exudate or posterior oropharyngeal erythema.  Eyes:     Pupils: Pupils are equal, round, and reactive to light.  Cardiovascular:     Rate and Rhythm: Normal rate and regular rhythm.     Heart sounds: No murmur heard. Pulmonary:     Effort: No respiratory distress.     Breath sounds: Normal breath sounds.  Musculoskeletal:  Cervical back: Normal range of motion and neck supple.     Right lower leg: Edema (1-2+) present.     Left lower leg: Edema (2+) present.  Neurological:     Mental Status: He is alert and oriented to person, place, and time.      Assessment & Plan:   Jon Reid was seen today for follow-up.  Diagnoses and all orders for this visit:  Leg swelling -     BMP8+EGFR  Other orders -     furosemide (LASIX) 40 MG tablet; Take 1 tablet (40 mg total) by mouth 2 (two)  times daily. One at breakfast. The other with lunch -     potassium chloride SA (KLOR-CON) 10 MEQ tablet; Take 2 tablets (20 mEq total) by mouth daily. For potassium replacement/ supplement      I have changed Jon Reid's potassium chloride SA to potassium chloride. I have also changed his furosemide. I am also having him maintain his fluticasone, nitroGLYCERIN, Vitamin D, ascorbic acid, triamcinolone cream, carbidopa-levodopa, Dextromethorphan-guaiFENesin, docusate sodium, melatonin, pantoprazole, senna-docusate, CertaVite/Antioxidants, aspirin, and Iron (Ferrous Sulfate).  Allergies as of 01/18/2021       Reactions   Crestor [rosuvastatin] Swelling   GYNECOMASTIA    Trazodone And Nefazodone    Hallucinations        Medication List        Accurate as of January 18, 2021  8:33 PM. If you have any questions, ask your nurse or doctor.          ascorbic acid 500 MG tablet Commonly known as: VITAMIN C Take 1 tablet (500 mg total) by mouth daily.   Aspirin Low Dose 81 MG EC tablet Generic drug: aspirin Take 1 tablet (81 mg total) by mouth daily. Swallow whole.   carbidopa-levodopa 25-100 MG tablet Commonly known as: SINEMET IR Take 1.5 tablets by mouth 3 (three) times daily.   CertaVite/Antioxidants Tabs Take 1 tablet by mouth daily.   docusate sodium 100 MG capsule Commonly known as: COLACE Take 1 capsule (100 mg total) by mouth 2 (two) times daily.   fluticasone 50 MCG/ACT nasal spray Commonly known as: FLONASE One to 2 sprays each nostril at bedtime What changed:  how much to take how to take this when to take this additional instructions   furosemide 40 MG tablet Commonly known as: LASIX Take 1 tablet (40 mg total) by mouth 2 (two) times daily. One at breakfast. The other with lunch What changed:  when to take this additional instructions Changed by: Claretta Fraise, MD   Iron (Ferrous Sulfate) 325 (65 Fe) MG Tabs Take 1 tablet by mouth daily.    melatonin 5 MG Tabs Take 1 tablet (5 mg total) by mouth at bedtime.   nitroGLYCERIN 0.4 MG SL tablet Commonly known as: NITROSTAT Place 1 tablet (0.4 mg total) under the tongue every 5 (five) minutes as needed for chest pain.   pantoprazole 40 MG tablet Commonly known as: PROTONIX Take 1 tablet (40 mg total) by mouth 2 (two) times daily.   potassium chloride 10 MEQ tablet Commonly known as: KLOR-CON Take 2 tablets (20 mEq total) by mouth daily. For potassium replacement/ supplement What changed: medication strength Changed by: Claretta Fraise, MD   Senexon-S 8.6-50 MG tablet Generic drug: senna-docusate Take 2 tablets by mouth daily with supper.   SM Tussin DM 10-100 MG/5ML liquid Generic drug: Dextromethorphan-guaiFENesin Take 5 mLs by mouth every 6 (six) hours.   triamcinolone cream 0.1 % Commonly known as:  KENALOG Apply 1 application topically daily as needed (itching).   Vitamin D 50 MCG (2000 UT) Caps Take 2,000 Units by mouth daily.         Follow-up: Return in about 2 weeks (around 02/01/2021) for CHF.  Claretta Fraise, M.D.

## 2021-01-19 ENCOUNTER — Other Ambulatory Visit: Payer: Self-pay

## 2021-01-19 ENCOUNTER — Ambulatory Visit: Payer: MEDICARE

## 2021-01-19 DIAGNOSIS — M6281 Muscle weakness (generalized): Secondary | ICD-10-CM | POA: Diagnosis not present

## 2021-01-19 DIAGNOSIS — M25651 Stiffness of right hip, not elsewhere classified: Secondary | ICD-10-CM | POA: Diagnosis not present

## 2021-01-19 DIAGNOSIS — R2681 Unsteadiness on feet: Secondary | ICD-10-CM | POA: Diagnosis not present

## 2021-01-19 LAB — BMP8+EGFR
BUN/Creatinine Ratio: 15 (ref 10–24)
BUN: 24 mg/dL (ref 10–36)
CO2: 25 mmol/L (ref 20–29)
Calcium: 9 mg/dL (ref 8.6–10.2)
Chloride: 101 mmol/L (ref 96–106)
Creatinine, Ser: 1.61 mg/dL — ABNORMAL HIGH (ref 0.76–1.27)
Glucose: 117 mg/dL — ABNORMAL HIGH (ref 70–99)
Potassium: 4.2 mmol/L (ref 3.5–5.2)
Sodium: 143 mmol/L (ref 134–144)
eGFR: 40 mL/min/{1.73_m2} — ABNORMAL LOW (ref >59)

## 2021-01-19 NOTE — Therapy (Signed)
[facility] [place], [place], [address] Phone: [phone]   Fax:  [phone]  Physical Therapy Treatment  Patient Details  Name: Jon Reid MRN: [id number] Date of Birth: [birth year] Referring Provider (PT): Stacks   Encounter Date: 01/19/2021   PT End of Session - 01/19/21 1039     Visit Number 3    Number of Visits 12    Date for PT Re-Evaluation 03/03/21    PT Start Time 1030    PT Stop Time 1114    PT Time Calculation (min) 44 min    Equipment Utilized During Treatment Other (comment)   FWW   Activity Tolerance Patient tolerated treatment well    Behavior During Therapy [facility] for tasks assessed/performed             Past Medical History:  Diagnosis Date   Arthritis    Carcinoma of prostate ([place])    prostate   CKD (chronic kidney disease), stage III ([place])    Coronary artery disease    a. 10/2012 Cath: LM 20-30d, lAD 80p, 21m, 80-90d, LCX 90-90m, OM1 90, RCA 50p, 110m, 50d, RPDA 90, EF nl; b. 10/2012 CABG x 4 (LIMA-LAD, SVG-OM1, SVG-PDA->RPL); c. 07/2015 MV: Basal and proximal septal infarct w/o ischemia. EF 57%-->low risk.   First degree AV block 01/28/2019   Noted on EKG   GERD (gastroesophageal reflux disease)    Gout    HTN (hypertension)    Macrocytosis without anemia 01/08/2012   Moderate aortic stenosis    a. 10/2012 Echo: EF 55-60%, no rwma, BAE, mild AI, mild TR; b. 06/2020 Echo: EF 50-55%, no rwma, mild LVH. Mod dil LA. Mild MR. Mod AS.   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   Sleep apnea    Questionable   Spinal stenosis    Stroke [facility])     Past Surgical History:  Procedure Laterality Date   BALLOON DILATION N/A 10/03/2020   Procedure: BALLOON DILATION;  Surgeon: [name], DO;  [facility];  Service: Endoscopy;  Laterality: N/A;   CARDIAC CATHETERIZATION  11/07/2012   Dr Acie Fredrickson   CATARACT  EXTRACTION W/ INTRAOCULAR LENS IMPLANT Bilateral    CORONARY ARTERY BYPASS GRAFT N/A 11/11/2012   Procedure: CORONARY ARTERY BYPASS GRAFTING times four using Right Greater Saphenous Vein Graft harvested endoscopically and Left Internal Mammary Artery.;  Surgeon: Ivin Poot, MD;  Location: [place];  Service: Open Heart Surgery;  Laterality: N/A;   ESOPHAGEAL BRUSHING  10/03/2020   Procedure: ESOPHAGEAL BRUSHING;  Surgeon: [name], DO;  [facility];  Service: Endoscopy;;   ESOPHAGOGASTRODUODENOSCOPY (EGD) WITH PROPOFOL N/A 06/27/2020   Surgeon: [name], DO;  large hiatal hernia, LA grade D esophagitis with no bleeding, normal examined duodenum.    ESOPHAGOGASTRODUODENOSCOPY (EGD) WITH PROPOFOL N/A 10/03/2020   Procedure: ESOPHAGOGASTRODUODENOSCOPY (EGD) WITH PROPOFOL;  Surgeon: [name], DO;  [facility];  Service: Endoscopy;  Laterality: N/A;  2:00pm   HIP ARTHROPLASTY Right 11/13/2020   Procedure: ARTHROPLASTY BIPOLAR HIP (HEMIARTHROPLASTY);  Surgeon: Renette Butters, MD;  Location: [place];  Service: Orthopedics;  Laterality: Right;   INGUINAL HERNIA REPAIR Right 01/10/2017   Procedure: OPEN RIGHT HERNIA REPAIR INGUINAL;  Surgeon: Ileana Roup, MD;  [facility];  Service: General;  Laterality: Right;   INSERTION OF MESH Right 01/10/2017  Procedure: INSERTION OF MESH;  Surgeon: Ileana Roup, MD;  [facility];  Service: General;  Laterality: Right;   INTRAOPERATIVE TRANSESOPHAGEAL ECHOCARDIOGRAM N/A 11/11/2012   Procedure: INTRAOPERATIVE TRANSESOPHAGEAL ECHOCARDIOGRAM;  Surgeon: Ivin Poot, MD;  Location: [place];  Service: Open Heart Surgery;  Laterality: N/A;   LEFT HEART CATHETERIZATION WITH CORONARY ANGIOGRAM N/A 11/07/2012   Procedure: LEFT HEART CATHETERIZATION WITH CORONARY ANGIOGRAM;  Surgeon: Thayer Headings, MD;  Location: [facility] CATH LAB;  Service: Cardiovascular;  Laterality: N/A;   LUMBAR  LAMINECTOMY/DECOMPRESSION MICRODISCECTOMY N/A 02/05/2019   Procedure: LUMBAR LAMINECTOMY/DECOMPRESSION L3-L4;  Surgeon: Latanya Maudlin, MD;  [facility];  Service: Orthopedics;  Laterality: N/A;  2min   LYMPH NODE DISSECTION     Bilateral pelvic   RETROPUBIC PROSTATECTOMY     Radical    There were no vitals filed for this visit.   Subjective Assessment - 01/19/21 1038     Subjective Pt denies any pain upon arriving for today's treatment.  Pt ambulates into the facility with personal RW.    Pertinent History history of multiple falls, HTN, history of a stroke    Limitations Walking    How long can you stand comfortably? about 30 minutes    How long can you walk comfortably? about 30 minutes    Patient Stated Goals exercise more    Currently in Pain? No/denies                               [facility] Adult PT Treatment/Exercise - 01/19/21 0001       Knee/Hip Exercises: Aerobic   Nustep Lvl 2 x 15 mins      Knee/Hip Exercises: Standing   Heel Raises Both;20 reps   BUE support, cues for technique   Hip Abduction Stengthening;Both;20 reps;Knee straight   cues for posture   Forward Step Up Both;20 reps;Hand Hold: 2;Step Height: 4"    Other Standing Knee Exercises sidestepping x 4 (down and back)      Knee/Hip Exercises: Seated   Long Arc Quad Strengthening;Both;20 reps;Weights    Long Arc Quad Weight 2 lbs.    Ball Squeeze 2 mins    Clamshell with TheraBand Yellow   2 mins   Primary school teacher;Both   2 mins, cues for pacing   Sit to Sand 15 reps;with UE support   close sup/CGA                         PT Long Term Goals - 01/12/21 1244       PT LONG TERM GOAL #1   Title Patient will be independent with his HEP.    Time 6    Period Weeks    Status New    Target Date 02/23/21      PT LONG TERM GOAL #2   Title Patient will improve his right hip MMT to at least a 4/5 for improved hip strength.    Baseline 3+/5    Time 6     Period Weeks    Status New    Target Date 02/23/21      PT LONG TERM GOAL #3   Title Patient will be able to safely transfer from sitting to standing with minimal upper extremity support.    Baseline moderate difficulty    Time 6    Period Weeks    Status New    Target Date 02/23/21  Plan - 01/19/21 1039     Clinical Impression Statement Pt arrives to the clinic today denying any pain.  Pt ambulates into the facility and throughout the facility with RW.  Decreased foot clearance and step length noted bilaterally.  Pt tolerated the addition of resistance and reps with various exercises during today's session without complaint of pain or fatigue, but fatigue visually noted.  Pt denies any pain at completion of today's treatment session.    Personal Factors and Comorbidities Age;Fitness;Time since onset of injury/illness/exacerbation;Transportation    Examination-Activity Limitations Locomotion Level;Transfers;Dressing;Stand    Examination-Participation Restrictions Other    Stability/Clinical Decision Making Evolving/Moderate complexity    Rehab Potential Fair    PT Frequency 2x / week    PT Duration 6 weeks    PT Treatment/Interventions ADLs/Self Care Home Management;Electrical Stimulation;Moist Heat;Gait training;Stair training;Functional mobility training;Therapeutic activities;Therapeutic exercise;Balance training;Neuromuscular re-education;Manual techniques;Patient/family education;Passive range of motion;Energy conservation    PT Next Visit Plan DOS: 11/13/20; Nustep, lower extremity strengthening, gait training with walker    Consulted and Agree with Plan of Care Patient             Patient will benefit from skilled therapeutic intervention in order to improve the following deficits and impairments:  Abnormal gait, Difficulty walking, Decreased range of motion, Decreased safety awareness, Decreased activity tolerance, Pain, Decreased balance, Decreased  strength, Postural dysfunction  Visit Diagnosis: Unsteadiness on feet  Muscle weakness (generalized)     Problem List Patient Active Problem List   Diagnosis Date Noted   Bifascicular block 12/20/2020   Cough 12/12/2020   AKI (acute kidney injury) ([place])    Hypokalemia    Acute on chronic anemia    Protein-calorie malnutrition, severe 11/26/2020   Pressure injury of skin 11/17/2020   Malnutrition of moderate degree 11/15/2020   S/P hip hemiarthroplasty    Closed right hip fracture, initial encounter ([place])    Femur fracture, right ([place]) 11/11/2020   Frail elderly 11/08/2020   Dizziness 09/23/2020   History of GI bleed 08/19/2020   Loss of weight 08/19/2020   Dysphagia 08/19/2020   Upper GI bleed 06/25/2020   COVID-19 virus infection 06/25/2020   [name] affect 02/09/2020   Iron deficiency anemia due to chronic blood loss 05/27/2019   Nonrheumatic mitral valve regurgitation 05/26/2019   Left anterior fascicular block 05/26/2019   Goals of care, counseling/discussion 04/06/2019   Constipation 02/19/2019   Elevated troponin 02/09/2019   Fecal occult blood test positive 02/09/2019   Spinal stenosis, lumbar region with neurogenic claudication 02/05/2019   Leg swelling 01/26/2019   Nonrheumatic aortic valve stenosis 01/26/2019   Multiple joint pain 12/11/2018   Chronic pain of both knees 11/07/2018   Vitamin D deficiency 10/23/2018   Gastroesophageal reflux disease with esophagitis 03/13/2018   Murmur, cardiac 12/04/2017   Coronary artery disease involving native coronary artery of native heart without angina pectoris 12/04/2017   PVC's (premature ventricular contractions) 12/04/2017   Heart disease 08/21/2017   Weakness 11/14/2016   Abnormality of gait 12/06/2015   Abdominal aortic atherosclerosis ([place]) 12/01/2015   Degeneration of lumbar intervertebral disc 12/01/2015   Exertional angina ([place]) 07/20/2015   Hernia of abdominal wall 10/12/2014   Gynecomastia  04/09/2013   Hx of CABG 02/02/2013   Myelodysplastic syndrome 11/06/2012   Vitamin B12 deficiency 07/07/2012   Hyperlipemia 07/07/2012   Peripheral vascular disease ([place]) 04/14/2009   CARCINOMA, PROSTATE 04/13/2009   Non-thrombocytopenic purpura ([place]) 04/13/2009   Benign essential hypertension 04/13/2009   Chronic renal insufficiency, stage  III (moderate) ([place]) 04/13/2009   SLEEP APNEA 04/13/2009    Kathrynn Ducking, PTA 01/19/2021, 11:14 AM  [facility] [address] [place], [place], [address] Phone: [phone]   Fax:  [phone]  Name: LEOCADIO HEAL MRN: [id number] Date of Birth: [birth year]

## 2021-01-24 ENCOUNTER — Encounter: Payer: Self-pay | Admitting: Physical Therapy

## 2021-01-24 ENCOUNTER — Other Ambulatory Visit: Payer: Self-pay

## 2021-01-24 ENCOUNTER — Encounter: Payer: Self-pay | Admitting: Internal Medicine

## 2021-01-24 ENCOUNTER — Ambulatory Visit: Payer: MEDICARE | Admitting: Physical Therapy

## 2021-01-24 DIAGNOSIS — M25651 Stiffness of right hip, not elsewhere classified: Secondary | ICD-10-CM | POA: Diagnosis not present

## 2021-01-24 DIAGNOSIS — R2681 Unsteadiness on feet: Secondary | ICD-10-CM | POA: Diagnosis not present

## 2021-01-24 DIAGNOSIS — M6281 Muscle weakness (generalized): Secondary | ICD-10-CM | POA: Diagnosis not present

## 2021-01-24 NOTE — Therapy (Signed)
[facility] [place], [place], [address] Phone: [phone]   Fax:  [phone]  Physical Therapy Treatment  Patient Details  Name: Jon Reid MRN: [id number] Date of Birth: 01/16/1929 Referring Provider (PT): Stacks   Encounter Date: 01/24/2021   PT End of Session - 01/24/21 1049     Visit Number 4    Number of Visits 12    Date for PT Re-Evaluation 03/03/21    PT Start Time 1033    PT Stop Time 1113    PT Time Calculation (min) 40 min    Equipment Utilized During Treatment Other (comment)   FWW   Activity Tolerance Patient tolerated treatment well    Behavior During Therapy [facility] for tasks assessed/performed             Past Medical History:  Diagnosis Date   Arthritis    Carcinoma of prostate ([place])    prostate   CKD (chronic kidney disease), stage III ([place])    Coronary artery disease    a. 10/2012 Cath: LM 20-30d, lAD 80p, 64m, 80-90d, LCX 90-21m, OM1 90, RCA 50p, 34m, 50d, RPDA 90, EF nl; b. 10/2012 CABG x 4 (LIMA-LAD, SVG-OM1, SVG-PDA->RPL); c. 07/2015 MV: Basal and proximal septal infarct w/o ischemia. EF 57%-->low risk.   First degree AV block 01/28/2019   Noted on EKG   GERD (gastroesophageal reflux disease)    Gout    HTN (hypertension)    Macrocytosis without anemia 01/08/2012   Moderate aortic stenosis    a. 10/2012 Echo: EF 55-60%, no rwma, BAE, mild AI, mild TR; b. 06/2020 Echo: EF 50-55%, no rwma, mild LVH. Mod dil LA. Mild MR. Mod AS.   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   Sleep apnea    Questionable   Spinal stenosis    Stroke [facility])     Past Surgical History:  Procedure Laterality Date   BALLOON DILATION N/A 10/03/2020   Procedure: BALLOON DILATION;  Surgeon: [name], DO;  [facility];  Service: Endoscopy;  Laterality: N/A;   CARDIAC CATHETERIZATION  11/07/2012   Dr Acie Fredrickson   CATARACT  EXTRACTION W/ INTRAOCULAR LENS IMPLANT Bilateral    CORONARY ARTERY BYPASS GRAFT N/A 11/11/2012   Procedure: CORONARY ARTERY BYPASS GRAFTING times four using Right Greater Saphenous Vein Graft harvested endoscopically and Left Internal Mammary Artery.;  Surgeon: Ivin Poot, MD;  Location: [place];  Service: Open Heart Surgery;  Laterality: N/A;   ESOPHAGEAL BRUSHING  10/03/2020   Procedure: ESOPHAGEAL BRUSHING;  Surgeon: [name], DO;  [facility];  Service: Endoscopy;;   ESOPHAGOGASTRODUODENOSCOPY (EGD) WITH PROPOFOL N/A 06/27/2020   Surgeon: [name], DO;  large hiatal hernia, LA grade D esophagitis with no bleeding, normal examined duodenum.    ESOPHAGOGASTRODUODENOSCOPY (EGD) WITH PROPOFOL N/A 10/03/2020   Procedure: ESOPHAGOGASTRODUODENOSCOPY (EGD) WITH PROPOFOL;  Surgeon: [name], DO;  [facility];  Service: Endoscopy;  Laterality: N/A;  2:00pm   HIP ARTHROPLASTY Right 11/13/2020   Procedure: ARTHROPLASTY BIPOLAR HIP (HEMIARTHROPLASTY);  Surgeon: Renette Butters, MD;  Location: [place];  Service: Orthopedics;  Laterality: Right;   INGUINAL HERNIA REPAIR Right 01/10/2017   Procedure: OPEN RIGHT HERNIA REPAIR INGUINAL;  Surgeon: Ileana Roup, MD;  [facility];  Service: General;  Laterality: Right;   INSERTION OF MESH Right 01/10/2017  Procedure: INSERTION OF MESH;  Surgeon: Ileana Roup, MD;  [facility];  Service: General;  Laterality: Right;   INTRAOPERATIVE TRANSESOPHAGEAL ECHOCARDIOGRAM N/A 11/11/2012   Procedure: INTRAOPERATIVE TRANSESOPHAGEAL ECHOCARDIOGRAM;  Surgeon: Ivin Poot, MD;  Location: [place];  Service: Open Heart Surgery;  Laterality: N/A;   LEFT HEART CATHETERIZATION WITH CORONARY ANGIOGRAM N/A 11/07/2012   Procedure: LEFT HEART CATHETERIZATION WITH CORONARY ANGIOGRAM;  Surgeon: Thayer Headings, MD;  Location: [facility] CATH LAB;  Service: Cardiovascular;  Laterality: N/A;   LUMBAR  LAMINECTOMY/DECOMPRESSION MICRODISCECTOMY N/A 02/05/2019   Procedure: LUMBAR LAMINECTOMY/DECOMPRESSION L3-L4;  Surgeon: Latanya Maudlin, MD;  [facility];  Service: Orthopedics;  Laterality: N/A;  50min   LYMPH NODE DISSECTION     Bilateral pelvic   RETROPUBIC PROSTATECTOMY     Radical    There were no vitals filed for this visit.   Subjective Assessment - 01/24/21 1036     Subjective Pt denies any pain upon arriving for today's treatment.  Pt ambulates into the facility with personal RW. Reports that getting into and out of the car is getting easier as his legs are more flexible.    Pertinent History history of multiple falls, HTN, history of a stroke    Limitations Walking    How long can you stand comfortably? about 30 minutes    How long can you walk comfortably? about 30 minutes    Patient Stated Goals exercise more    Currently in Pain? No/denies                [facility] PT Assessment - 01/24/21 0001       Assessment   Medical Diagnosis Right Hip Hemiarthroplasty    Referring Provider (PT) Stacks    Onset Date/Surgical Date 11/13/20    Next MD Visit 01/18/21    Prior Therapy Yes      Precautions   Precautions Fall    Precaution Comments He reports that he was not given any precautions                           [facility] Adult PT Treatment/Exercise - 01/24/21 0001       Knee/Hip Exercises: Aerobic   Nustep L4 x18 min      Knee/Hip Exercises: Seated   Long Arc Quad Strengthening;Both;20 reps;Weights    Long Arc Quad Weight 3 lbs.    Ball Squeeze 2 mins    Clamshell with TheraBand Yellow   x30 rep   Other Seated Knee/Hip Exercises B heel/toe raise x20 reps    Other Seated Knee/Hip Exercises Core hugs with ball x15 reps    Marching Strengthening;Both;20 reps;Weights    Marching Weights 2 lbs.    Hamstring Curl Strengthening;Both;20 reps;Limitations    Hamstring Limitations yellow theraband    Abduction/Adduction  Strengthening;Both;20  reps;Limitations    Abd/Adduction Limitations yellow theraband    Sit to Sand 20 reps;with UE support   VCs for hand placement                         PT Long Term Goals - 01/12/21 1244       PT LONG TERM GOAL #1   Title Patient will be independent with his HEP.    Time 6    Period Weeks    Status New    Target Date 02/23/21      PT LONG TERM GOAL #2   Title Patient  will improve his right hip MMT to at least a 4/5 for improved hip strength.    Baseline 3+/5    Time 6    Period Weeks    Status New    Target Date 02/23/21      PT LONG TERM GOAL #3   Title Patient will be able to safely transfer from sitting to standing with minimal upper extremity support.    Baseline moderate difficulty    Time 6    Period Weeks    Status New    Target Date 02/23/21                   Plan - 01/24/21 1114     Clinical Impression Statement Patient presented in clinic with reports of more independence and less difficulty with getting into and out of the car. Patient able to tolerate increased resistance for LAQ fair although trunk extension noted in sitting. Patient required intermittant VCs for technique of exercises but also for the importance of hand placement during sit <> stands. The height of the patient's FWW was also increased to improve standing posture and reduce trunk flexion.    Personal Factors and Comorbidities Age;Fitness;Time since onset of injury/illness/exacerbation;Transportation    Examination-Activity Limitations Locomotion Level;Transfers;Dressing;Stand    Examination-Participation Restrictions Other    Stability/Clinical Decision Making Evolving/Moderate complexity    Rehab Potential Fair    PT Frequency 2x / week    PT Duration 6 weeks    PT Treatment/Interventions ADLs/Self Care Home Management;Electrical Stimulation;Moist Heat;Gait training;Stair training;Functional mobility training;Therapeutic activities;Therapeutic exercise;Balance  training;Neuromuscular re-education;Manual techniques;Patient/family education;Passive range of motion;Energy conservation    PT Next Visit Plan DOS: 11/13/20; Nustep, lower extremity strengthening, gait training with walker    Consulted and Agree with Plan of Care Patient             Patient will benefit from skilled therapeutic intervention in order to improve the following deficits and impairments:  Abnormal gait, Difficulty walking, Decreased range of motion, Decreased safety awareness, Decreased activity tolerance, Pain, Decreased balance, Decreased strength, Postural dysfunction  Visit Diagnosis: Unsteadiness on feet  Muscle weakness (generalized)  Stiffness of right hip, not elsewhere classified     Problem List Patient Active Problem List   Diagnosis Date Noted   Bifascicular block 12/20/2020   Cough 12/12/2020   AKI (acute kidney injury) ([place])    Hypokalemia    Acute on chronic anemia    Protein-calorie malnutrition, severe 11/26/2020   Pressure injury of skin 11/17/2020   Malnutrition of moderate degree 11/15/2020   S/P hip hemiarthroplasty    Closed right hip fracture, initial encounter ([place])    Femur fracture, right ([place]) 11/11/2020   Frail elderly 11/08/2020   Dizziness 09/23/2020   History of GI bleed 08/19/2020   Loss of weight 08/19/2020   Dysphagia 08/19/2020   Upper GI bleed 06/25/2020   COVID-19 virus infection 06/25/2020   [name] affect 02/09/2020   Iron deficiency anemia due to chronic blood loss 05/27/2019   Nonrheumatic mitral valve regurgitation 05/26/2019   Left anterior fascicular block 05/26/2019   Goals of care, counseling/discussion 04/06/2019   Constipation 02/19/2019   Elevated troponin 02/09/2019   Fecal occult blood test positive 02/09/2019   Spinal stenosis, lumbar region with neurogenic claudication 02/05/2019   Leg swelling 01/26/2019   Nonrheumatic aortic valve stenosis 01/26/2019   Multiple joint pain 12/11/2018    Chronic pain of both knees 11/07/2018   Vitamin D deficiency 10/23/2018   Gastroesophageal reflux disease  with esophagitis 03/13/2018   Murmur, cardiac 12/04/2017   Coronary artery disease involving native coronary artery of native heart without angina pectoris 12/04/2017   PVC's (premature ventricular contractions) 12/04/2017   Heart disease 08/21/2017   Weakness 11/14/2016   Abnormality of gait 12/06/2015   Abdominal aortic atherosclerosis ([place]) 12/01/2015   Degeneration of lumbar intervertebral disc 12/01/2015   Exertional angina ([place]) 07/20/2015   Hernia of abdominal wall 10/12/2014   Gynecomastia 04/09/2013   Hx of CABG 02/02/2013   Myelodysplastic syndrome 11/06/2012   Vitamin B12 deficiency 07/07/2012   Hyperlipemia 07/07/2012   Peripheral vascular disease ([place]) 04/14/2009   CARCINOMA, PROSTATE 04/13/2009   Non-thrombocytopenic purpura ([place]) 04/13/2009   Benign essential hypertension 04/13/2009   Chronic renal insufficiency, stage III (moderate) ([place]) 04/13/2009   SLEEP APNEA 04/13/2009    Standley Brooking, PTA 01/24/2021, 12:15 PM  [facility] [place], [place], [address] Phone: [phone]   Fax:  [phone]  Name: Jon Reid MRN: [id number] Date of Birth: [birth year]

## 2021-01-26 ENCOUNTER — Encounter: Payer: Self-pay | Admitting: Neurology

## 2021-01-26 ENCOUNTER — Ambulatory Visit: Payer: MEDICARE

## 2021-01-26 ENCOUNTER — Ambulatory Visit (INDEPENDENT_AMBULATORY_CARE_PROVIDER_SITE_OTHER): Payer: MEDICARE | Admitting: Neurology

## 2021-01-26 VITALS — BP 128/70 | HR 68 | Ht 64.0 in | Wt 148.2 lb

## 2021-01-26 DIAGNOSIS — D500 Iron deficiency anemia secondary to blood loss (chronic): Secondary | ICD-10-CM | POA: Diagnosis not present

## 2021-01-26 DIAGNOSIS — I208 Other forms of angina pectoris: Secondary | ICD-10-CM

## 2021-01-26 DIAGNOSIS — R2689 Other abnormalities of gait and mobility: Secondary | ICD-10-CM

## 2021-01-26 DIAGNOSIS — Z9181 History of falling: Secondary | ICD-10-CM

## 2021-01-26 DIAGNOSIS — W19XXXS Unspecified fall, sequela: Secondary | ICD-10-CM

## 2021-01-26 NOTE — Patient Instructions (Signed)
I believe you have a multifactorial gait disorder, meaning, that it is [place] is due to a combination of factors. These factors include: normal aging, degenerative arthritis of your spine, anemia, having fallen with injury, deconditioning, and suboptimal hydration with water.    Please remember to stand up slowly and get your bearings first turn slowly, no bending down to pick anything, no heavy lifting, be extra careful at night and first thing in the morning. Also, be careful in the Bathroom and the kitchen.   Remember to drink plenty of fluid, eat healthy meals and do not skip any meals. Try to eat protein with a every meal and eat a healthy snack such as fruit or nuts or yogurt in between meals.   Please continue and complete your physical therapy.  Use your walker at all times.  I do not see any telltale signs of Parkinson's disease and since you are not on the Parkinson's medication on a regular basis I would favor that you stop the Sinemet/levodopa altogether. You did not notice any benefit while you are on it.    Please follow-up with your primary care physician closely, you are significantly anemic.  At this juncture you can follow-up with your specialists and your primary care physician as scheduled.  I would be happy to see you back in this clinic as needed.

## 2021-01-26 NOTE — Progress Notes (Deleted)
Subjective:    Patient ID: Jon Reid is a 87 y.o. male.  HPI {Common ambulatory SmartLinks:[id number]}  Review of Systems  Objective:  Neurological Exam  Physical Exam  Assessment:   ***  Plan:   ***

## 2021-01-26 NOTE — Progress Notes (Signed)
Subjective:    Patient ID: Jon Reid is a 87 y.o. male.  HPI    Interim history:   Jon Reid is a 87 year old left-handed gentleman with an underlying complex medical history of prostate cancer, chronic kidney disease, coronary artery disease with status post CABGx4, first-degree AV block, right bundle branch block, sleep apnea, stroke, reflux disease, gout, arthritis, hypertension, anemia, weight loss, sleep apnea, and spinal stenosis, who presents for follow-up consultation of his gait disorder.  The patient is accompanied by his son, Jon Reid, today and presents for his follow-up. He was previously followed by Dr. Jannifer Franklin.  He was last seen by Dr. Jannifer Franklin on 09/21/2020 and I reviewed the note, copied the note below for reference. The patient had a brain MRI with and without contrast on 11/02/2019, ordered by his primary care physician and I reviewed the results: IMPRESSION: 1. No acute intracranial abnormality. 2. Mild chronic small vessel ischemic disease and moderate cerebral atrophy.   He had a cervical spine MRI without contrast on 12/17/2017 and I reviewed the results: IMPRESSION: C1-2 arthritis which could contribute to regional pain syndromes. No neural compression.   C2-3 facet osteoarthritis and disc bulge which could contribute to regional pain syndromes. No apparent compressive stenosis.   C3-4: Chronic fusion or failure of segmentation. Chronic right foraminal narrowing, not likely compressive.   C4-5: Facet arthropathy on the right. Right foraminal narrowing that could affect the right C5 nerve.   C5-6: Facet arthropathy worse on the right. Spondylosis. Bilateral foraminal stenosis that could affect either C6 nerve   C6-7: Facet arthropathy with 3 mm of anterolisthesis. Foraminal stenosis right worse than left could affect either C7 nerve.   C7-T1: Facet degeneration.  No stenosis.   Compared to the study of 2012, the degenerative changes show progressive  worsening.  He had a lumbar spine.  Without contrast on 11/15/2017 and I reviewed the results: IMPRESSION: 1. Stable multilevel spondylosis of the lumbar spine. There is no significant interval change. 2. Mild subarticular and moderate foraminal narrowing at L1-2 is worse on the left. 3. Mild subarticular moderate foraminal narrowing bilaterally at L2-3. 4. Severe central canal stenosis at L3-4 with moderate foraminal narrowing bilaterally. 5. Severe right and moderate left subarticular narrowing at L4-5. 6. Severe right and mild left foraminal narrowing at L4-5. 7. Moderate foraminal narrowing bilaterally at L5-S1 secondary to facet spurring.  The patient has been on Sinemet for some concern for parkinsonism.  He did not notice much in the way of benefit when he started Sinemet, it was increased to 1-1/2 pills 3 times a day and he was referred to physical therapy.  Today, 01/26/2021: He reports overall doing a little better.  He feels that therapy has helped.  He is at PT outpatient in [place].  He lives with his wife and his twin sons.  He reports that he is not taking the Parkinson's medication.  He did not use it helped him and as far as he can tell he is actually not on it, his wife manages his pills, his son does not know for sure but does not believe he is on Sinemet at this time.  He uses a 2 wheeled walker.  He does not always hydrate well and admits that he could do a little better.  He is feeling fatigued a lot, he feels weaker some days than others.  He has anemia and reports that he has a follow-up appointment with his primary care.  He has required blood transfusions.  Of note, he had a prolonged rehab admission in September 2022 subsequent to a hospitalization after a fall in August 2022, during which he sustained a right femur fracture.  He was COVID-positive.  He underwent right hip hemiarthroplasty under Dr. Percell Miller.  He developed acute kidney injury during his rehab, he had  dysphagia, he developed confusion and an increase in his white cell count as well as I reviewed the rehab records.  She was discharged on 12/09/2020.  Prior to that he was hospitalized on 11/11/2020 and transferred to inpatient rehab on 11/17/2020.  I reviewed the hospital records as well. worsening cough and was treated with IV antibiotics, then oral antibiotics for pneumonia. Of note, he has significant anemia, on 01/05/2021 his hemoglobin was 8.6, hematocrit 27.6.  He is followed by cardiology, gastroenterology, and has close follow-up with his primary care physician.  He has been in physical therapy outpatient.  The patient's allergies, current medications, family history, past medical history, past social history, past surgical history and problem list were reviewed and updated as appropriate.   Previously:   09/21/20 (Dr. Jannifer Franklin): <<  Jon Reid is a 87 year old right-handed white male with a history of a gait disorder.  The patient has had issues with shuffling his feet, he has some tremor and decreased arm swing on the right, for this reason he was given a trial on low-dose Sinemet.  The patient currently is on the 25/100 mg tablets of Sinemet taking 1 tablet 3 times daily.  It is not clear that he has gained much benefit with his walking.  He recently was in the hospital on 25 June 2020 with an upper GI bleed associated with esophagitis.  His hemoglobin dropped to 6.1.  He denies any issues with falls, at times he may feel weak with standing.  He reports arthritis type pain in the right knee.  He has had a prior issue with spinal stenosis of the low back and has had surgery for this.  He continues to have weight loss, the etiology is not known.  Since he was seen here on 31 May 2020, he has lost 11 pounds of weight.  He returns for further evaluation.>>   His Past Medical History Is Significant For: Past Medical History:  Diagnosis Date   Arthritis    Carcinoma of prostate ([place])    prostate    CKD (chronic kidney disease), stage III ([place])    Coronary artery disease    a. 10/2012 Cath: LM 20-30d, lAD 80p, 31m, 80-90d, LCX 90-53m, OM1 90, RCA 50p, 37m, 50d, RPDA 90, EF nl; b. 10/2012 CABG x 4 (LIMA-LAD, SVG-OM1, SVG-PDA->RPL); c. 07/2015 MV: Basal and proximal septal infarct w/o ischemia. EF 57%-->low risk.   First degree AV block 01/28/2019   Noted on EKG   GERD (gastroesophageal reflux disease)    Gout    HTN (hypertension)    Macrocytosis without anemia 01/08/2012   Moderate aortic stenosis    a. 10/2012 Echo: EF 55-60%, no rwma, BAE, mild AI, mild TR; b. 06/2020 Echo: EF 50-55%, no rwma, mild LVH. Mod dil LA. Mild MR. Mod AS.   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   Sleep apnea    Questionable   Spinal stenosis    Stroke [facility])     His Past Surgical History Is Significant For: Past Surgical History:  Procedure Laterality Date   BALLOON DILATION N/A  10/03/2020   Procedure: BALLOON DILATION;  Surgeon: [name], DO;  [facility];  Service: Endoscopy;  Laterality: N/A;   CARDIAC CATHETERIZATION  11/07/2012   Dr Acie Fredrickson   CATARACT EXTRACTION W/ INTRAOCULAR LENS IMPLANT Bilateral    CORONARY ARTERY BYPASS GRAFT N/A 11/11/2012   Procedure: CORONARY ARTERY BYPASS GRAFTING times four using Right Greater Saphenous Vein Graft harvested endoscopically and Left Internal Mammary Artery.;  Surgeon: Ivin Poot, MD;  Location: [place];  Service: Open Heart Surgery;  Laterality: N/A;   ESOPHAGEAL BRUSHING  10/03/2020   Procedure: ESOPHAGEAL BRUSHING;  Surgeon: [name], DO;  [facility];  Service: Endoscopy;;   ESOPHAGOGASTRODUODENOSCOPY (EGD) WITH PROPOFOL N/A 06/27/2020   Surgeon: [name], DO;  large hiatal hernia, LA grade D esophagitis with no bleeding, normal examined duodenum.    ESOPHAGOGASTRODUODENOSCOPY (EGD) WITH PROPOFOL N/A 10/03/2020   Procedure:  ESOPHAGOGASTRODUODENOSCOPY (EGD) WITH PROPOFOL;  Surgeon: [name], DO;  [facility];  Service: Endoscopy;  Laterality: N/A;  2:00pm   HIP ARTHROPLASTY Right 11/13/2020   Procedure: ARTHROPLASTY BIPOLAR HIP (HEMIARTHROPLASTY);  Surgeon: Renette Butters, MD;  Location: [place];  Service: Orthopedics;  Laterality: Right;   INGUINAL HERNIA REPAIR Right 01/10/2017   Procedure: OPEN RIGHT HERNIA REPAIR INGUINAL;  Surgeon: Ileana Roup, MD;  Location: [facility];  Service: General;  Laterality: Right;   INSERTION OF MESH Right 01/10/2017   Procedure: INSERTION OF MESH;  Surgeon: Ileana Roup, MD;  Location: [facility];  Service: General;  Laterality: Right;   INTRAOPERATIVE TRANSESOPHAGEAL ECHOCARDIOGRAM N/A 11/11/2012   Procedure: INTRAOPERATIVE TRANSESOPHAGEAL ECHOCARDIOGRAM;  Surgeon: Ivin Poot, MD;  Location: [place];  Service: Open Heart Surgery;  Laterality: N/A;   LEFT HEART CATHETERIZATION WITH CORONARY ANGIOGRAM N/A 11/07/2012   Procedure: LEFT HEART CATHETERIZATION WITH CORONARY ANGIOGRAM;  Surgeon: Thayer Headings, MD;  Location[facility];  Service: Cardiovascular;  Laterality: N/A;   LUMBAR LAMINECTOMY/DECOMPRESSION MICRODISCECTOMY N/A 02/05/2019   Procedure: LUMBAR LAMINECTOMY/DECOMPRESSION L3-L4;  Surgeon: Latanya Maudlin, MD;  Location: [facility];  Service: Orthopedics;  Laterality: N/A;  14min   LYMPH NODE DISSECTION     Bilateral pelvic   RETROPUBIC PROSTATECTOMY     Radical    His Family History Is Significant For: Family History  Problem Relation Age of Onset   Aneurysm Father        Cerebral   Stroke Father    Hypertension Father    Cancer Mother        colon   Parkinson's disease Son     His Social History Is Significant For: Social History   Socioeconomic History   Marital status: Married    Spouse name: Jon Reid   Number of children: 3   Years of education: 12   Highest education level: High school graduate  Occupational History    Occupation: Retired  Tobacco Use   Smoking status: Never   Smokeless tobacco: Never  Scientific laboratory technician Use: Never used  Substance and Sexual Activity   Alcohol use: Yes    Comment: Rare   Drug use: No   Sexual activity: Not Currently  Other Topics Concern   Not on file  Social History Narrative   Lives at home with wife/sons will stay with them occassionally   Married with 3 children   Denies caffeine use    Social Determinants of Radio broadcast assistant Strain: Not on file  Food Insecurity:  Not on file  Transportation Needs: Not on file  Physical Activity: Not on file  Stress: Not on file  Social Connections: Not on file    His Allergies Are:  Allergies  Allergen Reactions   Crestor [Rosuvastatin] Swelling    GYNECOMASTIA    Trazodone And Nefazodone     Hallucinations  :   His Current Medications Are:  Outpatient Encounter Medications as of 01/26/2021  Medication Sig   ascorbic acid (VITAMIN C) 500 MG tablet Take 1 tablet (500 mg total) by mouth daily.   aspirin 81 MG EC tablet Take 1 tablet (81 mg total) by mouth daily. Swallow whole.   carbidopa-levodopa (SINEMET IR) 25-100 MG tablet Take 1.5 tablets by mouth 3 (three) times daily.   Cholecalciferol (VITAMIN D) 2000 units CAPS Take 2,000 Units by mouth daily.   Dextromethorphan-guaiFENesin 10-100 MG/5ML liquid Take 5 mLs by mouth every 6 (six) hours.   docusate sodium (COLACE) 100 MG capsule Take 1 capsule (100 mg total) by mouth 2 (two) times daily.   fluticasone (FLONASE) 50 MCG/ACT nasal spray One to 2 sprays each nostril at bedtime (Patient taking differently: Place 2 sprays into both nostrils at bedtime.)   furosemide (LASIX) 40 MG tablet Take 1 tablet (40 mg total) by mouth 2 (two) times daily. One at breakfast. The other with lunch   Iron, Ferrous Sulfate, 325 (65 Fe) MG TABS Take 1 tablet by mouth daily.   melatonin 5 MG TABS Take 1 tablet (5 mg total) by mouth at bedtime.   Multiple  Vitamins-Minerals (CERTAVITE/ANTIOXIDANTS) TABS Take 1 tablet by mouth daily.   nitroGLYCERIN (NITROSTAT) 0.4 MG SL tablet Place 1 tablet (0.4 mg total) under the tongue every 5 (five) minutes as needed for chest pain.   pantoprazole (PROTONIX) 40 MG tablet Take 1 tablet (40 mg total) by mouth 2 (two) times daily.   potassium chloride SA (KLOR-CON) 10 MEQ tablet Take 2 tablets (20 mEq total) by mouth daily. For potassium replacement/ supplement   senna-docusate (SENOKOT-S) 8.6-50 MG tablet Take 2 tablets by mouth daily with supper.   triamcinolone cream (KENALOG) 0.1 % Apply 1 application topically daily as needed (itching).   No facility-administered encounter medications on file as of 01/26/2021.  :  Review of Systems:  Out of a complete 14 point review of systems, all are reviewed and negative with the exception of these symptoms as listed below:  Review of Systems  Neurological:        Pt is here for follow up visit fir g[place] abnormality Pt states he has been taking outpatient rehab . Pt states he walks well with walker.    Objective:  Neurological Exam  Physical Exam Physical Examination:   Vitals:   01/26/21 1424  BP: 128/70  Pulse: 68    General Examination: The patient is a very pleasant 87 y.o. male in no acute distress.  He appears frail and deconditioned, he is well-groomed.    HEENT: Normocephalic, atraumatic, pupils are equal, round and reactive to light, tracking is well-preserved, no significant facial masking noted.  Hearing is grossly intact, speech is clear without obvious dysarthria, no lip, neck or jaw tremor, he does have nuchal rigidity and limitation in neck mobility.  Airway examination reveals mild to moderate mouth dryness.  Tongue protrudes centrally and palate elevates symmetrically.    Chest: Clear to auscultation without wheezing, rhonchi or crackles noted.  Heart: S1+S2+0, regular and normal without murmurs, rubs or gallops noted.   Abdomen: Soft,  non-tender and non-distended.  Extremities: There is trace pitting edema in the distal lower extremities bilaterally, left more than right.   Skin: Warm and dry without trophic changes noted.   Musculoskeletal: exam reveals no obvious joint deformities, limitation in range of motion in the right hip.   Neurologically:  Mental status: The patient is awake, alert and oriented in all 4 spheres. His immediate and remote memory, attention, language skills and fund of knowledge are mildly impaired but he is able to give his history quite well.  His son provides very little additional information. There is no evidence of aphasia, agnosia, apraxia or anomia. Speech is clear with normal prosody and enunciation. Thought process is linear. Mood is normal and affect is normal.  Cranial nerves II - XII are as described above under HEENT exam.  Motor exam: Thin bulk, global strength of 4 out of 5, no resting tremor noted, no significant rigidity noted.   Cerebellar testing: No dysmetria or intention tremor. There is no truncal or gait ataxia.  Sensory exam: intact to light touch in the upper and lower extremities.  Gait, station and balance: He stands with mild difficulty and pushes himself up but requires no assistance.  He walks with his 2 wheeled walker, maneuvers the walker fairly well, no shuffling noted.  His posture is stooped.   Assessment and Plan:    In summary, Jon Reid is a very pleasant 87 y.o.-year old male with an underlying complex medical history of prostate cancer, chronic kidney disease, coronary artery disease with status post CABGx4, first-degree AV block, right bundle branch block, sleep apnea, stroke, reflux disease, gout, arthritis, hypertension, anemia, weight loss, sleep apnea, and spinal stenosis, who presents for follow-up consultation of his gait disorder.  His history and examination are not total for Parkinson's disease.  He is no longer on his Sinemet and did not believe he  benefited from treatment.  His gait disorder is likely multifactorial in nature secondary to multiple factors including normal aging, degenerative spine arthritis, anemia, having fallen with significant injury sustained, deconditioning, and suboptimal hydration with water.  I had a long discussion with the patient and his son about this.  At this juncture, I would like for him to discontinue Sinemet, he has not actually taken it on a regular basis from what I understand.  He is advised to continue to use his walker at all times for gait safety and complete his physical therapy outpatient.  He is advised to stay better hydrated with water.  We talked about the importance of fall prevention.  At this juncture, there is not a whole lot we can contribute to his care.  He is advised to follow-up with his primary care physician as scheduled and his other specialists.  We can see him in this clinic on an as-needed basis.  I answered all her questions today and the patient and his son were in agreement.   I spent 45 minutes in total face-to-face time and in reviewing records during pre-charting, more than 50% of which was spent in counseling and coordination of care, reviewing test results, reviewing medications and treatment regimen and/or in discussing or reviewing the diagnosis of gait disorder, the prognosis and treatment options. Pertinent laboratory and imaging test results that were available during this visit with the patient were reviewed by me and considered in my medical decision making (see chart for details).

## 2021-01-30 ENCOUNTER — Ambulatory Visit: Payer: MEDICARE

## 2021-01-30 ENCOUNTER — Other Ambulatory Visit: Payer: Self-pay

## 2021-01-30 DIAGNOSIS — M25651 Stiffness of right hip, not elsewhere classified: Secondary | ICD-10-CM

## 2021-01-30 DIAGNOSIS — R2681 Unsteadiness on feet: Secondary | ICD-10-CM

## 2021-01-30 DIAGNOSIS — M6281 Muscle weakness (generalized): Secondary | ICD-10-CM | POA: Diagnosis not present

## 2021-01-30 NOTE — Therapy (Signed)
[facility] [place], [place], [address] Phone: [phone]   Fax:  [phone]  Physical Therapy Treatment  Patient Details  Name: Jon Reid MRN: [id number] Date of Birth: Jun 11, 1928 Referring Provider (PT): Stacks   Encounter Date: 01/30/2021   PT End of Session - 01/30/21 1033     Visit Number 5    Number of Visits 12    Date for PT Re-Evaluation 03/03/21    PT Start Time 1030    PT Stop Time 1112    PT Time Calculation (min) 42 min    Equipment Utilized During Treatment Other (comment)   FWW   Activity Tolerance Patient tolerated treatment well    Behavior During Therapy [facility] for tasks assessed/performed             Past Medical History:  Diagnosis Date   Arthritis    Carcinoma of prostate ([place])    prostate   CKD (chronic kidney disease), stage III ([place])    Coronary artery disease    a. 10/2012 Cath: LM 20-30d, lAD 80p, 21m, 80-90d, LCX 90-86m, OM1 90, RCA 50p, 6m, 50d, RPDA 90, EF nl; b. 10/2012 CABG x 4 (LIMA-LAD, SVG-OM1, SVG-PDA->RPL); c. 07/2015 MV: Basal and proximal septal infarct w/o ischemia. EF 57%-->low risk.   First degree AV block 01/28/2019   Noted on EKG   GERD (gastroesophageal reflux disease)    Gout    HTN (hypertension)    Macrocytosis without anemia 01/08/2012   Moderate aortic stenosis    a. 10/2012 Echo: EF 55-60%, no rwma, BAE, mild AI, mild TR; b. 06/2020 Echo: EF 50-55%, no rwma, mild LVH. Mod dil LA. Mild MR. Mod AS.   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   Sleep apnea    Questionable   Spinal stenosis    Stroke [facility])     Past Surgical History:  Procedure Laterality Date   BALLOON DILATION N/A 10/03/2020   Procedure: BALLOON DILATION;  Surgeon: [name], DO;  [facility];  Service: Endoscopy;  Laterality: N/A;   CARDIAC CATHETERIZATION  11/07/2012   Dr Acie Fredrickson   CATARACT  EXTRACTION W/ INTRAOCULAR LENS IMPLANT Bilateral    CORONARY ARTERY BYPASS GRAFT N/A 11/11/2012   Procedure: CORONARY ARTERY BYPASS GRAFTING times four using Right Greater Saphenous Vein Graft harvested endoscopically and Left Internal Mammary Artery.;  Surgeon: Ivin Poot, MD;  Location: [place];  Service: Open Heart Surgery;  Laterality: N/A;   ESOPHAGEAL BRUSHING  10/03/2020   Procedure: ESOPHAGEAL BRUSHING;  Surgeon: [name], DO;  [facility];  Service: Endoscopy;;   ESOPHAGOGASTRODUODENOSCOPY (EGD) WITH PROPOFOL N/A 06/27/2020   Surgeon: [name], DO;  large hiatal hernia, LA grade D esophagitis with no bleeding, normal examined duodenum.    ESOPHAGOGASTRODUODENOSCOPY (EGD) WITH PROPOFOL N/A 10/03/2020   Procedure: ESOPHAGOGASTRODUODENOSCOPY (EGD) WITH PROPOFOL;  Surgeon: [name], DO;  [facility];  Service: Endoscopy;  Laterality: N/A;  2:00pm   HIP ARTHROPLASTY Right 11/13/2020   Procedure: ARTHROPLASTY BIPOLAR HIP (HEMIARTHROPLASTY);  Surgeon: Renette Butters, MD;  Location: [place];  Service: Orthopedics;  Laterality: Right;   INGUINAL HERNIA REPAIR Right 01/10/2017   Procedure: OPEN RIGHT HERNIA REPAIR INGUINAL;  Surgeon: Ileana Roup, MD;  Location[facility];  Service: General;  Laterality: Right;   INSERTION OF MESH Right 01/10/2017  Procedure: INSERTION OF MESH;  Surgeon: Ileana Roup, MD;  [facility];  Service: General;  Laterality: Right;   INTRAOPERATIVE TRANSESOPHAGEAL ECHOCARDIOGRAM N/A 11/11/2012   Procedure: INTRAOPERATIVE TRANSESOPHAGEAL ECHOCARDIOGRAM;  Surgeon: Ivin Poot, MD;  Location: [place];  Service: Open Heart Surgery;  Laterality: N/A;   LEFT HEART CATHETERIZATION WITH CORONARY ANGIOGRAM N/A 11/07/2012   Procedure: LEFT HEART CATHETERIZATION WITH CORONARY ANGIOGRAM;  Surgeon: Thayer Headings, MD;  Location: [facility];  Service: Cardiovascular;  Laterality: N/A;   LUMBAR  LAMINECTOMY/DECOMPRESSION MICRODISCECTOMY N/A 02/05/2019   Procedure: LUMBAR LAMINECTOMY/DECOMPRESSION L3-L4;  Surgeon: Latanya Maudlin, MD;  Location[facility];  Service: Orthopedics;  Laterality: N/A;  33min   LYMPH NODE DISSECTION     Bilateral pelvic   RETROPUBIC PROSTATECTOMY     Radical    There were no vitals filed for this visit.   Subjective Assessment - 01/30/21 1031     Subjective Pt reported 6/10 right knee pain upon arriving for today's treatment.   Pt ambulates into the facility with personal RW.    Pertinent History history of multiple falls, HTN, history of a stroke    Limitations Walking    How long can you stand comfortably? about 30 minutes    How long can you walk comfortably? about 30 minutes    Patient Stated Goals exercise more    Currently in Pain? Yes    Pain Score 6     Pain Location Knee    Pain Orientation Right                               [facility] Adult PT Treatment/Exercise - 01/30/21 0001       Knee/Hip Exercises: Aerobic   Nustep Lvl 4 x 16 mins      Knee/Hip Exercises: Seated   Long Arc Quad Strengthening;Both;20 reps;Weights    Long Arc Quad Weight 3 lbs.    Ball Squeeze 2 mins    Clamshell with TheraBand Yellow   2 mins, cues for upright posture   Other Seated Knee/Hip Exercises B heel/toe raise x20 reps    Marching Strengthening;Both;20 reps;Weights    Marching Weights 3 lbs.    Hamstring Curl Strengthening;Both;20 reps;Limitations    Hamstring Limitations yellow tband    Abduction/Adduction  Strengthening;Both;20 reps;Limitations    Abd/Adduction Limitations yellow tband    Sit to Sand 20 reps;with UE support                          PT Long Term Goals - 01/12/21 1244       PT LONG TERM GOAL #1   Title Patient will be independent with his HEP.    Time 6    Period Weeks    Status New    Target Date 02/23/21      PT LONG TERM GOAL #2   Title Patient will improve his right hip MMT to at least  a 4/5 for improved hip strength.    Baseline 3+/5    Time 6    Period Weeks    Status New    Target Date 02/23/21      PT LONG TERM GOAL #3   Title Patient will be able to safely transfer from sitting to standing with minimal upper extremity support.    Baseline moderate difficulty    Time 6    Period Weeks    Status  New    Target Date 02/23/21                   Plan - 01/30/21 1033     Clinical Impression Statement Pt arrives for today treatment session reporting 4/10 right knee pain.  Pt states that every day he can tell a difference in his strength.  Pt is able to get inot and out of the car much easier since beginning therapy.  Pt requiring intermittant verbal cues for upright posture with all sitting exercises.  Pt given rest breaks as needed due to fatigue.  Pt reporting 1/10 right knee pain at completion of today's treatment session.    Personal Factors and Comorbidities Age;Fitness;Time since onset of injury/illness/exacerbation;Transportation    Examination-Activity Limitations Locomotion Level;Transfers;Dressing;Stand    Examination-Participation Restrictions Other    Stability/Clinical Decision Making Evolving/Moderate complexity    Rehab Potential Fair    PT Frequency 2x / week    PT Duration 6 weeks    PT Treatment/Interventions ADLs/Self Care Home Management;Electrical Stimulation;Moist Heat;Gait training;Stair training;Functional mobility training;Therapeutic activities;Therapeutic exercise;Balance training;Neuromuscular re-education;Manual techniques;Patient/family education;Passive range of motion;Energy conservation    PT Next Visit Plan DOS: 11/13/20; Nustep, lower extremity strengthening, gait training with walker    Consulted and Agree with Plan of Care Patient             Patient will benefit from skilled therapeutic intervention in order to improve the following deficits and impairments:  Abnormal gait, Difficulty walking, Decreased range of motion,  Decreased safety awareness, Decreased activity tolerance, Pain, Decreased balance, Decreased strength, Postural dysfunction  Visit Diagnosis: Unsteadiness on feet  Muscle weakness (generalized)  Stiffness of right hip, not elsewhere classified     Problem List Patient Active Problem List   Diagnosis Date Noted   Bifascicular block 12/20/2020   Cough 12/12/2020   AKI (acute kidney injury) ([place])    Hypokalemia    Acute on chronic anemia    Protein-calorie malnutrition, severe 11/26/2020   Pressure injury of skin 11/17/2020   Malnutrition of moderate degree 11/15/2020   S/P hip hemiarthroplasty    Closed right hip fracture, initial encounter ([place])    Femur fracture, right ([place]) 11/11/2020   Frail elderly 11/08/2020   Dizziness 09/23/2020   History of GI bleed 08/19/2020   Loss of weight 08/19/2020   Dysphagia 08/19/2020   Upper GI bleed 06/25/2020   COVID-19 virus infection 06/25/2020   [name] affect 02/09/2020   Iron deficiency anemia due to chronic blood loss 05/27/2019   Nonrheumatic mitral valve regurgitation 05/26/2019   Left anterior fascicular block 05/26/2019   Goals of care, counseling/discussion 04/06/2019   Constipation 02/19/2019   Elevated troponin 02/09/2019   Fecal occult blood test positive 02/09/2019   Spinal stenosis, lumbar region with neurogenic claudication 02/05/2019   Leg swelling 01/26/2019   Nonrheumatic aortic valve stenosis 01/26/2019   Multiple joint pain 12/11/2018   Chronic pain of both knees 11/07/2018   Vitamin D deficiency 10/23/2018   Gastroesophageal reflux disease with esophagitis 03/13/2018   Murmur, cardiac 12/04/2017   Coronary artery disease involving native coronary artery of native heart without angina pectoris 12/04/2017   PVC's (premature ventricular contractions) 12/04/2017   Heart disease 08/21/2017   Weakness 11/14/2016   Abnormality of gait 12/06/2015   Abdominal aortic atherosclerosis ([place]) 12/01/2015    Degeneration of lumbar intervertebral disc 12/01/2015   Exertional angina ([place]) 07/20/2015   Hernia of abdominal wall 10/12/2014   Gynecomastia 04/09/2013   Hx of CABG 02/02/2013  Myelodysplastic syndrome 11/06/2012   Vitamin B12 deficiency 07/07/2012   Hyperlipemia 07/07/2012   Peripheral vascular disease ([place]) 04/14/2009   CARCINOMA, PROSTATE 04/13/2009   Non-thrombocytopenic purpura ([place]) 04/13/2009   Benign essential hypertension 04/13/2009   Chronic renal insufficiency, stage III (moderate) ([place]) 04/13/2009   SLEEP APNEA 04/13/2009    Kathrynn Ducking, PTA 01/30/2021, 11:13 AM  [facility] [address] [place], [place], [address] Phone: [phone]   Fax:  [phone]  Name: Jon Reid MRN: [id number] Date of Birth: 04/03/1928

## 2021-02-01 ENCOUNTER — Encounter: Payer: Self-pay | Admitting: Family Medicine

## 2021-02-01 ENCOUNTER — Other Ambulatory Visit: Payer: Self-pay

## 2021-02-01 ENCOUNTER — Ambulatory Visit (INDEPENDENT_AMBULATORY_CARE_PROVIDER_SITE_OTHER): Payer: MEDICARE | Admitting: Family Medicine

## 2021-02-01 VITALS — BP 134/66 | HR 88 | Temp 96.5°F | Ht 64.0 in | Wt 147.8 lb

## 2021-02-01 DIAGNOSIS — I208 Other forms of angina pectoris: Secondary | ICD-10-CM | POA: Diagnosis not present

## 2021-02-01 DIAGNOSIS — Z23 Encounter for immunization: Secondary | ICD-10-CM

## 2021-02-01 DIAGNOSIS — R600 Localized edema: Secondary | ICD-10-CM | POA: Diagnosis not present

## 2021-02-01 DIAGNOSIS — I509 Heart failure, unspecified: Secondary | ICD-10-CM

## 2021-02-01 DIAGNOSIS — N1832 Chronic kidney disease, stage 3b: Secondary | ICD-10-CM

## 2021-02-01 DIAGNOSIS — D649 Anemia, unspecified: Secondary | ICD-10-CM

## 2021-02-01 NOTE — Progress Notes (Signed)
Follow-up on his swelling and history of congestive heart failure.2  Subjective:  Patient ID: Jon Reid, male    DOB: 28-Jun-1928  Age: 87 y.o. MRN: [id number]  CC: Follow-up   HPI Jon Reid presents for follow-up on his congestive heart failure for which he was admitted several weeks ago.  He mentions having visited Dr. Percival Spanish recently.  That report was reviewed.  Dr. Percival Spanish expressed concern about diuresing the patient.  He suggested compression stockings instead.  Patient does continue to take low-dose of Lasix daily.  We will follow him his kidney functions closely with that.  He mentions that he has been unsteady on his feet at times.  He remains very weak.  He is not short of breath.  Patient was hospitalized few months ago with a GI bleed.  Of note is that his hemoglobin dropped somewhat on last visit.  We will need to repeat the hemoglobin type.  Depression screen [facility] 2/9 02/01/2021 01/18/2021 01/05/2021  Decreased Interest 0 0 0  Down, Depressed, Hopeless 0 0 0  PHQ - 2 Score 0 0 0  Altered sleeping - - 0  Tired, decreased energy - - 0  Change in appetite - - 0  Feeling bad or failure about yourself  - - 0  Trouble concentrating - - 0  Moving slowly or fidgety/restless - - 0  Suicidal thoughts - - 0  PHQ-9 Score - - 0  Difficult doing work/chores - - Not difficult at all  Some recent data might be hidden    History Jon Reid has a past medical history of Arthritis, Carcinoma of prostate ([place]), CKD (chronic kidney disease), stage III ([place]), Coronary artery disease, First degree AV block (01/28/2019), GERD (gastroesophageal reflux disease), Gout, HTN (hypertension), Macrocytosis without anemia (01/08/2012), Moderate aortic stenosis, [name] affect (02/09/2020), Right BBB/left ant fasc block (01/28/2019), S/P CABG x 4 (02/02/2013), Sleep apnea, Spinal stenosis, and Stroke ([place]).   He has a past surgical history that includes Retropubic prostatectomy; Lymph node  dissection; Cardiac catheterization (11/07/2012); Coronary artery bypass graft (N/A, 11/11/2012); Intraoprative transesophageal echocardiogram (N/A, 11/11/2012); left heart catheterization with coronary angiogram (N/A, 11/07/2012); Inguinal hernia repair (Right, 01/10/2017); Insertion of mesh (Right, 01/10/2017); Cataract extraction w/ intraocular lens implant (Bilateral); Lumbar laminectomy/decompression microdiscectomy (N/A, 02/05/2019); Esophagogastroduodenoscopy (egd) with propofol (N/A, 06/27/2020); Esophagogastroduodenoscopy (egd) with propofol (N/A, 10/03/2020); Balloon dilation (N/A, 10/03/2020); Esophageal brushing (10/03/2020); and Hip Arthroplasty (Right, 11/13/2020).   His family history includes Aneurysm in his father; Cancer in his mother; Hypertension in his father; Parkinson's disease in his son; Stroke in his father.He reports that he has never smoked. He has never used smokeless tobacco. He reports current alcohol use. He reports that he does not use drugs.    ROS Review of Systems  Constitutional:  Negative for fever.  Respiratory:  Negative for shortness of breath.   Cardiovascular:  Negative for chest pain.  Musculoskeletal:  Negative for arthralgias.  Skin:  Negative for rash.  Neurological:  Positive for weakness.   Objective:  BP 134/66   Pulse 88   Temp (!) 96.5 F (35.8 C)   Ht 5' 4"  (1.626 m)   Wt 147 lb 12.8 oz (67 kg)   SpO2 98%   BMI 25.37 kg/m   BP Readings from Last 3 Encounters:  02/01/21 134/66  01/26/21 128/70  01/18/21 (!) 148/62    Wt Readings from Last 3 Encounters:  02/01/21 147 lb 12.8 oz (67 kg)  01/26/21 148 lb 3.2 oz (67.2 kg)  01/18/21 147 lb 12.8 oz (67 kg)     Physical Exam Vitals reviewed.  Constitutional:      Appearance: He is well-developed.  HENT:     Head: Normocephalic and atraumatic.     Right Ear: External ear normal.     Left Ear: External ear normal.     Mouth/Throat:     Pharynx: No oropharyngeal exudate or posterior  oropharyngeal erythema.  Eyes:     Pupils: Pupils are equal, round, and reactive to light.  Cardiovascular:     Rate and Rhythm: Normal rate and regular rhythm.     Heart sounds: No murmur heard. Pulmonary:     Effort: No respiratory distress.     Breath sounds: Normal breath sounds.  Musculoskeletal:     Cervical back: Normal range of motion and neck supple.     Right lower leg: Edema (2+ pitting) present.     Left lower leg: Edema (2+ pitting) present.  Neurological:     Mental Status: He is alert and oriented to person, place, and time.      Assessment & Plan:   Jon Reid was seen today for follow-up.  Diagnoses and all orders for this visit:  Acute congestive heart failure, unspecified heart failure type ([place]) -     CMP14+EGFR -     Brain natriuretic peptide  Edema leg -     Compression stockings -     Brain natriuretic peptide  Chronic renal impairment, stage 3b (H[facility]) -     CMP14+EGFR  Acute on chronic anemia -     CBC with Differential/Platelet  Need for influenza vaccination -     Flu Vaccine QUAD High Dose(Fluad)      I am having Barnabas Lister A. Lich maintain his fluticasone, nitroGLYCERIN, Vitamin D, ascorbic acid, triamcinolone cream, carbidopa-levodopa, Dextromethorphan-guaiFENesin, docusate sodium, melatonin, pantoprazole, senna-docusate, CertaVite/Antioxidants, aspirin, Iron (Ferrous Sulfate), furosemide, and potassium chloride.  Allergies as of 02/01/2021       Reactions   Crestor [rosuvastatin] Swelling   GYNECOMASTIA    Trazodone And Nefazodone    Hallucinations        Medication List        Accurate as of February 01, 2021  6:04 PM. If you have any questions, ask your nurse or doctor.          ascorbic acid 500 MG tablet Commonly known as: VITAMIN C Take 1 tablet (500 mg total) by mouth daily.   Aspirin Low Dose 81 MG EC tablet Generic drug: aspirin Take 1 tablet (81 mg total) by mouth daily. Swallow whole.   carbidopa-levodopa  25-100 MG tablet Commonly known as: SINEMET IR Take 1.5 tablets by mouth 3 (three) times daily.   CertaVite/Antioxidants Tabs Take 1 tablet by mouth daily.   docusate sodium 100 MG capsule Commonly known as: COLACE Take 1 capsule (100 mg total) by mouth 2 (two) times daily.   fluticasone 50 MCG/ACT nasal spray Commonly known as: FLONASE One to 2 sprays each nostril at bedtime What changed:  how much to take how to take this when to take this additional instructions   furosemide 40 MG tablet Commonly known as: LASIX Take 1 tablet (40 mg total) by mouth 2 (two) times daily. One at breakfast. The other with lunch   Iron (Ferrous Sulfate) 325 (65 Fe) MG Tabs Take 1 tablet by mouth daily.   melatonin 5 MG Tabs Take 1 tablet (5 mg total) by mouth at bedtime.   nitroGLYCERIN 0.4 MG SL  tablet Commonly known as: NITROSTAT Place 1 tablet (0.4 mg total) under the tongue every 5 (five) minutes as needed for chest pain.   pantoprazole 40 MG tablet Commonly known as: PROTONIX Take 1 tablet (40 mg total) by mouth 2 (two) times daily.   potassium chloride 10 MEQ tablet Commonly known as: KLOR-CON Take 2 tablets (20 mEq total) by mouth daily. For potassium replacement/ supplement   Senexon-S 8.6-50 MG tablet Generic drug: senna-docusate Take 2 tablets by mouth daily with supper.   SM Tussin DM 10-100 MG/5ML liquid Generic drug: Dextromethorphan-guaiFENesin Take 5 mLs by mouth every 6 (six) hours.   triamcinolone cream 0.1 % Commonly known as: KENALOG Apply 1 application topically daily as needed (itching).   Vitamin D 50 MCG (2000 UT) Caps Take 2,000 Units by mouth daily.         Follow-up: Return in about 1 month (around 03/03/2021).  Claretta Fraise, M.D.

## 2021-02-02 ENCOUNTER — Other Ambulatory Visit: Payer: Self-pay

## 2021-02-02 ENCOUNTER — Ambulatory Visit: Payer: MEDICARE

## 2021-02-02 DIAGNOSIS — R2681 Unsteadiness on feet: Secondary | ICD-10-CM

## 2021-02-02 DIAGNOSIS — M6281 Muscle weakness (generalized): Secondary | ICD-10-CM

## 2021-02-02 DIAGNOSIS — M25651 Stiffness of right hip, not elsewhere classified: Secondary | ICD-10-CM | POA: Diagnosis not present

## 2021-02-02 LAB — CBC WITH DIFFERENTIAL/PLATELET
Basophils Absolute: 0 10*3/uL (ref 0.0–0.2)
Basos: 1 %
EOS (ABSOLUTE): 0.1 10*3/uL (ref 0.0–0.4)
Eos: 2 %
Hematocrit: 33.6 % — ABNORMAL LOW (ref 37.5–51.0)
Hemoglobin: 11.1 g/dL — ABNORMAL LOW (ref 13.0–17.7)
Immature Grans (Abs): 0 10*3/uL (ref 0.0–0.1)
Immature Granulocytes: 0 %
Lymphocytes Absolute: 2.3 10*3/uL (ref 0.7–3.1)
Lymphs: 29 %
MCH: 31.1 pg (ref 26.6–33.0)
MCHC: 33 g/dL (ref 31.5–35.7)
MCV: 94 fL (ref 79–97)
Monocytes Absolute: 0.8 10*3/uL (ref 0.1–0.9)
Monocytes: 11 %
Neutrophils Absolute: 4.4 10*3/uL (ref 1.4–7.0)
Neutrophils: 57 %
Platelets: 282 10*3/uL (ref 150–450)
RBC: 3.57 x10E6/uL — ABNORMAL LOW (ref 4.14–5.80)
RDW: 16.6 % — ABNORMAL HIGH (ref 11.6–15.4)
WBC: 7.7 10*3/uL (ref 3.4–10.8)

## 2021-02-02 LAB — CMP14+EGFR
ALT: 13 IU/L (ref 0–44)
AST: 28 IU/L (ref 0–40)
Albumin/Globulin Ratio: 1.7 (ref 1.2–2.2)
Albumin: 4.5 g/dL (ref 3.5–4.6)
Alkaline Phosphatase: 123 IU/L — ABNORMAL HIGH (ref 44–121)
BUN/Creatinine Ratio: 17 (ref 10–24)
BUN: 26 mg/dL (ref 10–36)
Bilirubin Total: 0.4 mg/dL (ref 0.0–1.2)
CO2: 24 mmol/L (ref 20–29)
Calcium: 9.1 mg/dL (ref 8.6–10.2)
Chloride: 103 mmol/L (ref 96–106)
Creatinine, Ser: 1.53 mg/dL — ABNORMAL HIGH (ref 0.76–1.27)
Globulin, Total: 2.7 g/dL (ref 1.5–4.5)
Glucose: 100 mg/dL — ABNORMAL HIGH (ref 70–99)
Potassium: 4.5 mmol/L (ref 3.5–5.2)
Sodium: 147 mmol/L — ABNORMAL HIGH (ref 134–144)
Total Protein: 7.2 g/dL (ref 6.0–8.5)
eGFR: 43 mL/min/{1.73_m2} — ABNORMAL LOW (ref >59)

## 2021-02-02 LAB — BRAIN NATRIURETIC PEPTIDE: BNP: 96.6 pg/mL (ref 0.0–100.0)

## 2021-02-02 NOTE — Progress Notes (Signed)
Hello Jabarie,  Your lab result is normal and/or stable.Some minor variations that are not significant are commonly marked abnormal, but do not represent any medical problem for you.  Best regards, Claretta Fraise, M.D.

## 2021-02-02 NOTE — Therapy (Signed)
[facility] [place], [place], [address] Phone: [phone]   Fax:  [phone]  Physical Therapy Treatment  Patient Details  Name: Jon Reid MRN: [id number] Date of Birth: [birth year] Referring Provider (PT): Stacks   Encounter Date: 02/02/2021   PT End of Session - 02/02/21 1034     Visit Number 6    Number of Visits 12    Date for PT Re-Evaluation 03/03/21    PT Start Time 1030    PT Stop Time 1112    PT Time Calculation (min) 42 min    Equipment Utilized During Treatment Other (comment)   FWW   Activity Tolerance Patient tolerated treatment well    Behavior During Therapy [facility] for tasks assessed/performed             Past Medical History:  Diagnosis Date   Arthritis    Carcinoma of prostate ([place])    prostate   CKD (chronic kidney disease), stage III ([place])    Coronary artery disease    a. 10/2012 Cath: LM 20-30d, lAD 80p, 83m, 80-90d, LCX 90-74m, OM1 90, RCA 50p, 40m, 50d, RPDA 90, EF nl; b. 10/2012 CABG x 4 (LIMA-LAD, SVG-OM1, SVG-PDA->RPL); c. 07/2015 MV: Basal and proximal septal infarct w/o ischemia. EF 57%-->low risk.   First degree AV block 01/28/2019   Noted on EKG   GERD (gastroesophageal reflux disease)    Gout    HTN (hypertension)    Macrocytosis without anemia 01/08/2012   Moderate aortic stenosis    a. 10/2012 Echo: EF 55-60%, no rwma, BAE, mild AI, mild TR; b. 06/2020 Echo: EF 50-55%, no rwma, mild LVH. Mod dil LA. Mild MR. Mod AS.   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   Sleep apnea    Questionable   Spinal stenosis    Stroke [facility])     Past Surgical History:  Procedure Laterality Date   BALLOON DILATION N/A 10/03/2020   Procedure: BALLOON DILATION;  Surgeon: [name], DO;  [facility];  Service: Endoscopy;  Laterality: N/A;   CARDIAC CATHETERIZATION  11/07/2012   Dr Acie Fredrickson   CATARACT  EXTRACTION W/ INTRAOCULAR LENS IMPLANT Bilateral    CORONARY ARTERY BYPASS GRAFT N/A 11/11/2012   Procedure: CORONARY ARTERY BYPASS GRAFTING times four using Right Greater Saphenous Vein Graft harvested endoscopically and Left Internal Mammary Artery.;  Surgeon: Ivin Poot, MD;  Location: [place];  Service: Open Heart Surgery;  Laterality: N/A;   ESOPHAGEAL BRUSHING  10/03/2020   Procedure: ESOPHAGEAL BRUSHING;  Surgeon: [name], DO;  [facility];  Service: Endoscopy;;   ESOPHAGOGASTRODUODENOSCOPY (EGD) WITH PROPOFOL N/A 06/27/2020   Surgeon: [name], DO;  large hiatal hernia, LA grade D esophagitis with no bleeding, normal examined duodenum.    ESOPHAGOGASTRODUODENOSCOPY (EGD) WITH PROPOFOL N/A 10/03/2020   Procedure: ESOPHAGOGASTRODUODENOSCOPY (EGD) WITH PROPOFOL;  Surgeon: [name], DO;  [facility];  Service: Endoscopy;  Laterality: N/A;  2:00pm   HIP ARTHROPLASTY Right 11/13/2020   Procedure: ARTHROPLASTY BIPOLAR HIP (HEMIARTHROPLASTY);  Surgeon: Renette Butters, MD;  Location: [place];  Service: Orthopedics;  Laterality: Right;   INGUINAL HERNIA REPAIR Right 01/10/2017   Procedure: OPEN RIGHT HERNIA REPAIR INGUINAL;  Surgeon: Ileana Roup, MD;  [facility];  Service: General;  Laterality: Right;   INSERTION OF MESH Right 01/10/2017  Procedure: INSERTION OF MESH;  Surgeon: Ileana Roup, MD;  [facility];  Service: General;  Laterality: Right;   INTRAOPERATIVE TRANSESOPHAGEAL ECHOCARDIOGRAM N/A 11/11/2012   Procedure: INTRAOPERATIVE TRANSESOPHAGEAL ECHOCARDIOGRAM;  Surgeon: Ivin Poot, MD;  Location: [place];  Service: Open Heart Surgery;  Laterality: N/A;   LEFT HEART CATHETERIZATION WITH CORONARY ANGIOGRAM N/A 11/07/2012   Procedure: LEFT HEART CATHETERIZATION WITH CORONARY ANGIOGRAM;  Surgeon: Thayer Headings, MD;  Location: [facility] CATH LAB;  Service: Cardiovascular;  Laterality: N/A;   LUMBAR  LAMINECTOMY/DECOMPRESSION MICRODISCECTOMY N/A 02/05/2019   Procedure: LUMBAR LAMINECTOMY/DECOMPRESSION L3-L4;  Surgeon: Latanya Maudlin, MD;  [facility];  Service: Orthopedics;  Laterality: N/A;  69min   LYMPH NODE DISSECTION     Bilateral pelvic   RETROPUBIC PROSTATECTOMY     Radical    There were no vitals filed for this visit.   Subjective Assessment - 02/02/21 1033     Subjective Pt arrives to today's treatment ambulating with his personal RW.  Pt reports 7/10 right knee pain today.    Pertinent History history of multiple falls, HTN, history of a stroke    Limitations Walking    How long can you stand comfortably? about 30 minutes    How long can you walk comfortably? about 30 minutes    Patient Stated Goals exercise more    Currently in Pain? Yes    Pain Score 7     Pain Location Knee    Pain Orientation Right                               [facility] Adult PT Treatment/Exercise - 02/02/21 0001       Knee/Hip Exercises: Aerobic   Nustep Lvl 4 x 15 mins      Knee/Hip Exercises: Seated   Long Arc Quad Strengthening;Both;20 reps;Weights    Long Arc Quad Weight 2 lbs.    Long [employer] Limitations Increased difficulty with RLE    Ball Squeeze 3 mins    Clamshell with TheraBand Red   2 mins   Other Seated Knee/Hip Exercises B heel/toe raises x 20 reps    Marching Strengthening;Both;20 reps;Weights    Marching Weights 2 lbs.    Hamstring Curl Strengthening;Both;20 reps   red tband                         PT Long Term Goals - 01/12/21 1244       PT LONG TERM GOAL #1   Title Patient will be independent with his HEP.    Time 6    Period Weeks    Status New    Target Date 02/23/21      PT LONG TERM GOAL #2   Title Patient will improve his right hip MMT to at least a 4/5 for improved hip strength.    Baseline 3+/5    Time 6    Period Weeks    Status New    Target Date 02/23/21      PT LONG TERM GOAL #3   Title Patient will  be able to safely transfer from sitting to standing with minimal upper extremity support.    Baseline moderate difficulty    Time 6    Period Weeks    Status New    Target Date 02/23/21  Plan - 02/02/21 1034     Clinical Impression Statement Pt arrives for today's treatment session reporting 7/10 right knee and quad pain.  Pt with marked increased weakness today.  Pt with difficulty performing STS transfers from Nustep and straight back chair.  Pt requiring increased number of rest breaks today due to fatigue.  Decreased weight with marches and LAQ today due to fatigue.  Pt reported 7/10 right knee pain at completion of today's treatment session.    Personal Factors and Comorbidities Age;Fitness;Time since onset of injury/illness/exacerbation;Transportation    Examination-Activity Limitations Locomotion Level;Transfers;Dressing;Stand    Examination-Participation Restrictions Other    Stability/Clinical Decision Making Evolving/Moderate complexity    Rehab Potential Fair    PT Frequency 2x / week    PT Duration 6 weeks    PT Treatment/Interventions ADLs/Self Care Home Management;Electrical Stimulation;Moist Heat;Gait training;Stair training;Functional mobility training;Therapeutic activities;Therapeutic exercise;Balance training;Neuromuscular re-education;Manual techniques;Patient/family education;Passive range of motion;Energy conservation    PT Next Visit Plan DOS: 11/13/20; Nustep, lower extremity strengthening, gait training with walker    Consulted and Agree with Plan of Care Patient             Patient will benefit from skilled therapeutic intervention in order to improve the following deficits and impairments:  Abnormal gait, Difficulty walking, Decreased range of motion, Decreased safety awareness, Decreased activity tolerance, Pain, Decreased balance, Decreased strength, Postural dysfunction  Visit Diagnosis: Unsteadiness on feet  Muscle weakness  (generalized)     Problem List Patient Active Problem List   Diagnosis Date Noted   Bifascicular block 12/20/2020   Cough 12/12/2020   AKI (acute kidney injury) ([place])    Hypokalemia    Acute on chronic anemia    Protein-calorie malnutrition, severe 11/26/2020   Pressure injury of skin 11/17/2020   Malnutrition of moderate degree 11/15/2020   S/P hip hemiarthroplasty    Closed right hip fracture, initial encounter ([place])    Femur fracture, right ([place]) 11/11/2020   Frail elderly 11/08/2020   Dizziness 09/23/2020   History of GI bleed 08/19/2020   Loss of weight 08/19/2020   Dysphagia 08/19/2020   Upper GI bleed 06/25/2020   COVID-19 virus infection 06/25/2020   [name] affect 02/09/2020   Iron deficiency anemia due to chronic blood loss 05/27/2019   Nonrheumatic mitral valve regurgitation 05/26/2019   Left anterior fascicular block 05/26/2019   Goals of care, counseling/discussion 04/06/2019   Constipation 02/19/2019   Elevated troponin 02/09/2019   Fecal occult blood test positive 02/09/2019   Spinal stenosis, lumbar region with neurogenic claudication 02/05/2019   Leg swelling 01/26/2019   Nonrheumatic aortic valve stenosis 01/26/2019   Multiple joint pain 12/11/2018   Chronic pain of both knees 11/07/2018   Vitamin D deficiency 10/23/2018   Gastroesophageal reflux disease with esophagitis 03/13/2018   Murmur, cardiac 12/04/2017   Coronary artery disease involving native coronary artery of native heart without angina pectoris 12/04/2017   PVC's (premature ventricular contractions) 12/04/2017   Heart disease 08/21/2017   Weakness 11/14/2016   Abnormality of gait 12/06/2015   Abdominal aortic atherosclerosis ([place]) 12/01/2015   Degeneration of lumbar intervertebral disc 12/01/2015   Exertional angina ([place]) 07/20/2015   Hernia of abdominal wall 10/12/2014   Gynecomastia 04/09/2013   Hx of CABG 02/02/2013   Myelodysplastic syndrome 11/06/2012   Vitamin B12  deficiency 07/07/2012   Hyperlipemia 07/07/2012   Peripheral vascular disease ([place]) 04/14/2009   CARCINOMA, PROSTATE 04/13/2009   Non-thrombocytopenic purpura ([place]) 04/13/2009   Benign essential hypertension 04/13/2009   Chronic  renal insufficiency, stage III (moderate) ([place]) 04/13/2009   SLEEP APNEA 04/13/2009    Kathrynn Ducking, PTA 02/02/2021, 11:13 AM  [facility] [address] [place], [place], [address] Phone: [phone]   Fax:  [phone]  Name: Jon Reid MRN: [id number] Date of Birth: Jul 17, 1928

## 2021-02-03 ENCOUNTER — Ambulatory Visit: Payer: 59 | Admitting: Gastroenterology

## 2021-02-07 ENCOUNTER — Other Ambulatory Visit: Payer: Self-pay

## 2021-02-07 ENCOUNTER — Ambulatory Visit: Payer: MEDICARE

## 2021-02-07 DIAGNOSIS — I5032 Chronic diastolic (congestive) heart failure: Secondary | ICD-10-CM | POA: Diagnosis present

## 2021-02-07 DIAGNOSIS — M109 Gout, unspecified: Secondary | ICD-10-CM | POA: Diagnosis present

## 2021-02-07 DIAGNOSIS — I130 Hypertensive heart and chronic kidney disease with heart failure and stage 1 through stage 4 chronic kidney disease, or unspecified chronic kidney disease: Secondary | ICD-10-CM | POA: Diagnosis present

## 2021-02-07 DIAGNOSIS — K2101 Gastro-esophageal reflux disease with esophagitis, with bleeding: Secondary | ICD-10-CM | POA: Diagnosis present

## 2021-02-07 DIAGNOSIS — R609 Edema, unspecified: Secondary | ICD-10-CM | POA: Diagnosis not present

## 2021-02-07 DIAGNOSIS — E860 Dehydration: Secondary | ICD-10-CM | POA: Diagnosis present

## 2021-02-07 DIAGNOSIS — I959 Hypotension, unspecified: Secondary | ICD-10-CM | POA: Diagnosis present

## 2021-02-07 DIAGNOSIS — R2681 Unsteadiness on feet: Secondary | ICD-10-CM

## 2021-02-07 DIAGNOSIS — K2100 Gastro-esophageal reflux disease with esophagitis, without bleeding: Secondary | ICD-10-CM | POA: Diagnosis not present

## 2021-02-07 DIAGNOSIS — I2510 Atherosclerotic heart disease of native coronary artery without angina pectoris: Secondary | ICD-10-CM | POA: Diagnosis present

## 2021-02-07 DIAGNOSIS — H5789 Other specified disorders of eye and adnexa: Secondary | ICD-10-CM | POA: Diagnosis not present

## 2021-02-07 DIAGNOSIS — K922 Gastrointestinal hemorrhage, unspecified: Secondary | ICD-10-CM | POA: Diagnosis present

## 2021-02-07 DIAGNOSIS — M25651 Stiffness of right hip, not elsewhere classified: Secondary | ICD-10-CM

## 2021-02-07 DIAGNOSIS — N179 Acute kidney failure, unspecified: Secondary | ICD-10-CM | POA: Diagnosis present

## 2021-02-07 DIAGNOSIS — Z79899 Other long term (current) drug therapy: Secondary | ICD-10-CM | POA: Diagnosis not present

## 2021-02-07 DIAGNOSIS — K449 Diaphragmatic hernia without obstruction or gangrene: Secondary | ICD-10-CM | POA: Diagnosis present

## 2021-02-07 DIAGNOSIS — G4730 Sleep apnea, unspecified: Secondary | ICD-10-CM | POA: Diagnosis present

## 2021-02-07 DIAGNOSIS — Z8616 Personal history of COVID-19: Secondary | ICD-10-CM | POA: Diagnosis not present

## 2021-02-07 DIAGNOSIS — R269 Unspecified abnormalities of gait and mobility: Secondary | ICD-10-CM | POA: Diagnosis present

## 2021-02-07 DIAGNOSIS — K5900 Constipation, unspecified: Secondary | ICD-10-CM | POA: Diagnosis not present

## 2021-02-07 DIAGNOSIS — E785 Hyperlipidemia, unspecified: Secondary | ICD-10-CM | POA: Diagnosis present

## 2021-02-07 DIAGNOSIS — Z79890 Hormone replacement therapy (postmenopausal): Secondary | ICD-10-CM | POA: Diagnosis not present

## 2021-02-07 DIAGNOSIS — M4800 Spinal stenosis, site unspecified: Secondary | ICD-10-CM | POA: Diagnosis present

## 2021-02-07 DIAGNOSIS — I452 Bifascicular block: Secondary | ICD-10-CM | POA: Diagnosis present

## 2021-02-07 DIAGNOSIS — M6281 Muscle weakness (generalized): Secondary | ICD-10-CM

## 2021-02-07 DIAGNOSIS — R58 Hemorrhage, not elsewhere classified: Secondary | ICD-10-CM | POA: Diagnosis not present

## 2021-02-07 DIAGNOSIS — R112 Nausea with vomiting, unspecified: Secondary | ICD-10-CM | POA: Diagnosis not present

## 2021-02-07 DIAGNOSIS — M1990 Unspecified osteoarthritis, unspecified site: Secondary | ICD-10-CM | POA: Diagnosis present

## 2021-02-07 DIAGNOSIS — E876 Hypokalemia: Secondary | ICD-10-CM | POA: Diagnosis present

## 2021-02-07 DIAGNOSIS — I10 Essential (primary) hypertension: Secondary | ICD-10-CM | POA: Diagnosis not present

## 2021-02-07 DIAGNOSIS — N1831 Chronic kidney disease, stage 3a: Secondary | ICD-10-CM | POA: Diagnosis present

## 2021-02-07 DIAGNOSIS — R7989 Other specified abnormal findings of blood chemistry: Secondary | ICD-10-CM | POA: Diagnosis not present

## 2021-02-07 DIAGNOSIS — R079 Chest pain, unspecified: Secondary | ICD-10-CM | POA: Diagnosis not present

## 2021-02-07 DIAGNOSIS — R1110 Vomiting, unspecified: Secondary | ICD-10-CM | POA: Diagnosis not present

## 2021-02-07 DIAGNOSIS — R110 Nausea: Secondary | ICD-10-CM | POA: Diagnosis not present

## 2021-02-07 DIAGNOSIS — Z7982 Long term (current) use of aspirin: Secondary | ICD-10-CM | POA: Diagnosis not present

## 2021-02-07 DIAGNOSIS — D649 Anemia, unspecified: Secondary | ICD-10-CM | POA: Diagnosis present

## 2021-02-07 DIAGNOSIS — R6889 Other general symptoms and signs: Secondary | ICD-10-CM | POA: Diagnosis not present

## 2021-02-07 DIAGNOSIS — Z743 Need for continuous supervision: Secondary | ICD-10-CM | POA: Diagnosis not present

## 2021-02-07 NOTE — Therapy (Signed)
[facility] [place], [place], [address] Phone: [phone]   Fax:  [phone]  Physical Therapy Treatment  Patient Details  Name: Jon Reid MRN: [id number] Date of Birth: 1928-06-08 Referring Provider (PT): Stacks   Encounter Date: 02/07/2021   PT End of Session - 02/07/21 1051     Visit Number 7    Number of Visits 12    Date for PT Re-Evaluation 03/03/21    PT Start Time 1030    PT Stop Time 1114    PT Time Calculation (min) 44 min    Equipment Utilized During Treatment Other (comment)   FWW   Activity Tolerance Patient tolerated treatment well    Behavior During Therapy [facility] for tasks assessed/performed             Past Medical History:  Diagnosis Date   Arthritis    Carcinoma of prostate ([place])    prostate   CKD (chronic kidney disease), stage III ([place])    Coronary artery disease    a. 10/2012 Cath: LM 20-30d, lAD 80p, 50m, 80-90d, LCX 90-41m, OM1 90, RCA 50p, 59m, 50d, RPDA 90, EF nl; b. 10/2012 CABG x 4 (LIMA-LAD, SVG-OM1, SVG-PDA->RPL); c. 07/2015 MV: Basal and proximal septal infarct w/o ischemia. EF 57%-->low risk.   First degree AV block 01/28/2019   Noted on EKG   GERD (gastroesophageal reflux disease)    Gout    HTN (hypertension)    Macrocytosis without anemia 01/08/2012   Moderate aortic stenosis    a. 10/2012 Echo: EF 55-60%, no rwma, BAE, mild AI, mild TR; b. 06/2020 Echo: EF 50-55%, no rwma, mild LVH. Mod dil LA. Mild MR. Mod AS.   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   Sleep apnea    Questionable   Spinal stenosis    Stroke [facility])     Past Surgical History:  Procedure Laterality Date   BALLOON DILATION N/A 10/03/2020   Procedure: BALLOON DILATION;  Surgeon: [name], DO;  [facility];  Service: Endoscopy;  Laterality: N/A;   CARDIAC CATHETERIZATION  11/07/2012   Dr Acie Fredrickson   CATARACT  EXTRACTION W/ INTRAOCULAR LENS IMPLANT Bilateral    CORONARY ARTERY BYPASS GRAFT N/A 11/11/2012   Procedure: CORONARY ARTERY BYPASS GRAFTING times four using Right Greater Saphenous Vein Graft harvested endoscopically and Left Internal Mammary Artery.;  Surgeon: Ivin Poot, MD;  Location: [place];  Service: Open Heart Surgery;  Laterality: N/A;   ESOPHAGEAL BRUSHING  10/03/2020   Procedure: ESOPHAGEAL BRUSHING;  Surgeon: [name], DO;  [facility];  Service: Endoscopy;;   ESOPHAGOGASTRODUODENOSCOPY (EGD) WITH PROPOFOL N/A 06/27/2020   Surgeon: [name], DO;  large hiatal hernia, LA grade D esophagitis with no bleeding, normal examined duodenum.    ESOPHAGOGASTRODUODENOSCOPY (EGD) WITH PROPOFOL N/A 10/03/2020   Procedure: ESOPHAGOGASTRODUODENOSCOPY (EGD) WITH PROPOFOL;  Surgeon: [name], DO;  [facility];  Service: Endoscopy;  Laterality: N/A;  2:00pm   HIP ARTHROPLASTY Right 11/13/2020   Procedure: ARTHROPLASTY BIPOLAR HIP (HEMIARTHROPLASTY);  Surgeon: Renette Butters, MD;  Location: [place];  Service: Orthopedics;  Laterality: Right;   INGUINAL HERNIA REPAIR Right 01/10/2017   Procedure: OPEN RIGHT HERNIA REPAIR INGUINAL;  Surgeon: Ileana Roup, MD;  [facility];  Service: General;  Laterality: Right;   INSERTION OF MESH Right 01/10/2017  Procedure: INSERTION OF MESH;  Surgeon: Ileana Roup, MD;  [facility];  Service: General;  Laterality: Right;   INTRAOPERATIVE TRANSESOPHAGEAL ECHOCARDIOGRAM N/A 11/11/2012   Procedure: INTRAOPERATIVE TRANSESOPHAGEAL ECHOCARDIOGRAM;  Surgeon: Ivin Poot, MD;  Location: [place];  Service: Open Heart Surgery;  Laterality: N/A;   LEFT HEART CATHETERIZATION WITH CORONARY ANGIOGRAM N/A 11/07/2012   Procedure: LEFT HEART CATHETERIZATION WITH CORONARY ANGIOGRAM;  Surgeon: Thayer Headings, MD;  Location: [facility] CATH LAB;  Service: Cardiovascular;  Laterality: N/A;   LUMBAR  LAMINECTOMY/DECOMPRESSION MICRODISCECTOMY N/A 02/05/2019   Procedure: LUMBAR LAMINECTOMY/DECOMPRESSION L3-L4;  Surgeon: Latanya Maudlin, MD;  [facility];  Service: Orthopedics;  Laterality: N/A;  91min   LYMPH NODE DISSECTION     Bilateral pelvic   RETROPUBIC PROSTATECTOMY     Radical    There were no vitals filed for this visit.   Subjective Assessment - 02/07/21 1031     Subjective Patient reports that he feels ok today. He notes that he has been feeling sick the last few days which caused him to feel weak and tired.    Pertinent History history of multiple falls, HTN, history of a stroke    Limitations Walking    How long can you stand comfortably? about 30 minutes    How long can you walk comfortably? about 30 minutes    Patient Stated Goals exercise more    Currently in Pain? No/denies                               [facility] Adult PT Treatment/Exercise - 02/07/21 0001       Ambulation/Gait   Gait Comments 5 laps; cuing for upright stance      Knee/Hip Exercises: Aerobic   Nustep Lvl 4 x 15 minutes      Knee/Hip Exercises: Seated   Long Arc Quad Strengthening;Both;20 reps;Weights    Long Arc Quad Weight 3 lbs.    Ball Squeeze 3 minutes    Marching Strengthening;Both;20 reps;Weights    Marching Weights 3 lbs.    Sit to Sand with UE support   2x10                         PT Long Term Goals - 01/12/21 1244       PT LONG TERM GOAL #1   Title Patient will be independent with his HEP.    Time 6    Period Weeks    Status New    Target Date 02/23/21      PT LONG TERM GOAL #2   Title Patient will improve his right hip MMT to at least a 4/5 for improved hip strength.    Baseline 3+/5    Time 6    Period Weeks    Status New    Target Date 02/23/21      PT LONG TERM GOAL #3   Title Patient will be able to safely transfer from sitting to standing with minimal upper extremity support.    Baseline moderate difficulty    Time 6     Period Weeks    Status New    Target Date 02/23/21                   Plan - 02/07/21 1051     Clinical Impression Statement Patient presented to treatment reporting that he feel on 11/18 while standing trying to  lock the door to his office. He feels that his left hip is sore, but he did not injure his hip as it does hurt to the touch. He did not exhit any tenderness to palpation or pain in the left hip. Treatment focused on familiar interventions for improved lower extremity strength. He required moderate cuing with long arc quads for improved knee extension. He also required cuing with ambulation to facilitate upright stance to improve his safety when utilizing his rolling walker. His step height and upright stance began to steadily decrease after each lap and his ability to maintain proper gait mechancis was unable to be sustained despite cuing. He reported feeling alright upon the conclusion of treatment. He continues to require skilled physical therapy to address his remaining impairments to maximize his functional mobility.    Personal Factors and Comorbidities Age;Fitness;Time since onset of injury/illness/exacerbation;Transportation    Examination-Activity Limitations Locomotion Level;Transfers;Dressing;Stand    Examination-Participation Restrictions Other    Stability/Clinical Decision Making Evolving/Moderate complexity    Rehab Potential Fair    PT Frequency 2x / week    PT Duration 6 weeks    PT Treatment/Interventions ADLs/Self Care Home Management;Electrical Stimulation;Moist Heat;Gait training;Stair training;Functional mobility training;Therapeutic activities;Therapeutic exercise;Balance training;Neuromuscular re-education;Manual techniques;Patient/family education;Passive range of motion;Energy conservation    PT Next Visit Plan DOS: 11/13/20; Nustep, lower extremity strengthening, gait training with walker    Consulted and Agree with Plan of Care Patient              Patient will benefit from skilled therapeutic intervention in order to improve the following deficits and impairments:  Abnormal gait, Difficulty walking, Decreased range of motion, Decreased safety awareness, Decreased activity tolerance, Pain, Decreased balance, Decreased strength, Postural dysfunction  Visit Diagnosis: Unsteadiness on feet  Muscle weakness (generalized)  Stiffness of right hip, not elsewhere classified     Problem List Patient Active Problem List   Diagnosis Date Noted   Bifascicular block 12/20/2020   Cough 12/12/2020   AKI (acute kidney injury) ([place])    Hypokalemia    Acute on chronic anemia    Protein-calorie malnutrition, severe 11/26/2020   Pressure injury of skin 11/17/2020   Malnutrition of moderate degree 11/15/2020   S/P hip hemiarthroplasty    Closed right hip fracture, initial encounter ([place])    Femur fracture, right ([place]) 11/11/2020   Frail elderly 11/08/2020   Dizziness 09/23/2020   History of GI bleed 08/19/2020   Loss of weight 08/19/2020   Dysphagia 08/19/2020   Upper GI bleed 06/25/2020   COVID-19 virus infection 06/25/2020   [name] affect 02/09/2020   Iron deficiency anemia due to chronic blood loss 05/27/2019   Nonrheumatic mitral valve regurgitation 05/26/2019   Left anterior fascicular block 05/26/2019   Goals of care, counseling/discussion 04/06/2019   Constipation 02/19/2019   Elevated troponin 02/09/2019   Fecal occult blood test positive 02/09/2019   Spinal stenosis, lumbar region with neurogenic claudication 02/05/2019   Leg swelling 01/26/2019   Nonrheumatic aortic valve stenosis 01/26/2019   Multiple joint pain 12/11/2018   Chronic pain of both knees 11/07/2018   Vitamin D deficiency 10/23/2018   Gastroesophageal reflux disease with esophagitis 03/13/2018   Murmur, cardiac 12/04/2017   Coronary artery disease involving native coronary artery of native heart without angina pectoris 12/04/2017   PVC's  (premature ventricular contractions) 12/04/2017   Heart disease 08/21/2017   Weakness 11/14/2016   Abnormality of gait 12/06/2015   Abdominal aortic atherosclerosis ([place]) 12/01/2015   Degeneration of lumbar intervertebral disc 12/01/2015  Exertional angina ([facility]) 07/20/2015   Hernia of abdominal wall 10/12/2014   Gynecomastia 04/09/2013   Hx of CABG 02/02/2013   Myelodysplastic syndrome 11/06/2012   Vitamin B12 deficiency 07/07/2012   Hyperlipemia 07/07/2012   Peripheral vascular disease ([place]) 04/14/2009   CARCINOMA, PROSTATE 04/13/2009   Non-thrombocytopenic purpura ([place]) 04/13/2009   Benign essential hypertension 04/13/2009   Chronic renal insufficiency, stage III (moderate) ([place]) 04/13/2009   SLEEP APNEA 04/13/2009    Darlin Coco, PT 02/07/2021, 12:33 PM  [facility] [address] [place], [place], [address] Phone: [phone]   Fax:  [phone]  Name: Jon Reid MRN: [id number] Date of Birth: 02-02-1929

## 2021-02-08 ENCOUNTER — Other Ambulatory Visit: Payer: Self-pay

## 2021-02-08 ENCOUNTER — Inpatient Hospital Stay (HOSPITAL_COMMUNITY)
Admission: EM | Admit: 2021-02-08 | Discharge: 2021-02-11 | DRG: 682 | Disposition: A | Discharge status: Home Health | Payer: MEDICARE | Attending: Internal Medicine | Admitting: Internal Medicine

## 2021-02-08 ENCOUNTER — Encounter (HOSPITAL_COMMUNITY): Payer: Self-pay

## 2021-02-08 ENCOUNTER — Emergency Department (HOSPITAL_COMMUNITY): Payer: MEDICARE

## 2021-02-08 ENCOUNTER — Ambulatory Visit (INDEPENDENT_AMBULATORY_CARE_PROVIDER_SITE_OTHER): Payer: MEDICARE | Admitting: Family Medicine

## 2021-02-08 DIAGNOSIS — Z951 Presence of aortocoronary bypass graft: Secondary | ICD-10-CM

## 2021-02-08 DIAGNOSIS — Z9079 Acquired absence of other genital organ(s): Secondary | ICD-10-CM

## 2021-02-08 DIAGNOSIS — Z79890 Hormone replacement therapy (postmenopausal): Secondary | ICD-10-CM

## 2021-02-08 DIAGNOSIS — E860 Dehydration: Secondary | ICD-10-CM | POA: Diagnosis present

## 2021-02-08 DIAGNOSIS — R0602 Shortness of breath: Secondary | ICD-10-CM

## 2021-02-08 DIAGNOSIS — G4730 Sleep apnea, unspecified: Secondary | ICD-10-CM | POA: Diagnosis present

## 2021-02-08 DIAGNOSIS — I2510 Atherosclerotic heart disease of native coronary artery without angina pectoris: Secondary | ICD-10-CM | POA: Diagnosis present

## 2021-02-08 DIAGNOSIS — Z8249 Family history of ischemic heart disease and other diseases of the circulatory system: Secondary | ICD-10-CM

## 2021-02-08 DIAGNOSIS — M109 Gout, unspecified: Secondary | ICD-10-CM | POA: Diagnosis present

## 2021-02-08 DIAGNOSIS — K5900 Constipation, unspecified: Secondary | ICD-10-CM | POA: Diagnosis not present

## 2021-02-08 DIAGNOSIS — R112 Nausea with vomiting, unspecified: Secondary | ICD-10-CM

## 2021-02-08 DIAGNOSIS — Z7982 Long term (current) use of aspirin: Secondary | ICD-10-CM

## 2021-02-08 DIAGNOSIS — M4800 Spinal stenosis, site unspecified: Secondary | ICD-10-CM | POA: Diagnosis present

## 2021-02-08 DIAGNOSIS — I959 Hypotension, unspecified: Secondary | ICD-10-CM | POA: Diagnosis present

## 2021-02-08 DIAGNOSIS — K449 Diaphragmatic hernia without obstruction or gangrene: Secondary | ICD-10-CM | POA: Diagnosis present

## 2021-02-08 DIAGNOSIS — I5032 Chronic diastolic (congestive) heart failure: Secondary | ICD-10-CM | POA: Diagnosis present

## 2021-02-08 DIAGNOSIS — K2101 Gastro-esophageal reflux disease with esophagitis, with bleeding: Secondary | ICD-10-CM | POA: Diagnosis present

## 2021-02-08 DIAGNOSIS — Z91199 Patient's noncompliance with other medical treatment and regimen due to unspecified reason: Secondary | ICD-10-CM

## 2021-02-08 DIAGNOSIS — Z885 Allergy status to narcotic agent status: Secondary | ICD-10-CM

## 2021-02-08 DIAGNOSIS — E876 Hypokalemia: Secondary | ICD-10-CM | POA: Diagnosis present

## 2021-02-08 DIAGNOSIS — E785 Hyperlipidemia, unspecified: Secondary | ICD-10-CM | POA: Diagnosis present

## 2021-02-08 DIAGNOSIS — I452 Bifascicular block: Secondary | ICD-10-CM | POA: Diagnosis present

## 2021-02-08 DIAGNOSIS — D649 Anemia, unspecified: Secondary | ICD-10-CM | POA: Diagnosis present

## 2021-02-08 DIAGNOSIS — Z888 Allergy status to other drugs, medicaments and biological substances status: Secondary | ICD-10-CM

## 2021-02-08 DIAGNOSIS — M1990 Unspecified osteoarthritis, unspecified site: Secondary | ICD-10-CM | POA: Diagnosis present

## 2021-02-08 DIAGNOSIS — Z8546 Personal history of malignant neoplasm of prostate: Secondary | ICD-10-CM

## 2021-02-08 DIAGNOSIS — I10 Essential (primary) hypertension: Secondary | ICD-10-CM | POA: Diagnosis present

## 2021-02-08 DIAGNOSIS — R9431 Abnormal electrocardiogram [ECG] [EKG]: Secondary | ICD-10-CM | POA: Diagnosis present

## 2021-02-08 DIAGNOSIS — N179 Acute kidney failure, unspecified: Principal | ICD-10-CM | POA: Diagnosis present

## 2021-02-08 DIAGNOSIS — N1831 Chronic kidney disease, stage 3a: Secondary | ICD-10-CM | POA: Diagnosis present

## 2021-02-08 DIAGNOSIS — N183 Chronic kidney disease, stage 3 unspecified: Secondary | ICD-10-CM | POA: Diagnosis present

## 2021-02-08 DIAGNOSIS — K2100 Gastro-esophageal reflux disease with esophagitis, without bleeding: Secondary | ICD-10-CM | POA: Diagnosis present

## 2021-02-08 DIAGNOSIS — H5789 Other specified disorders of eye and adnexa: Secondary | ICD-10-CM | POA: Diagnosis not present

## 2021-02-08 DIAGNOSIS — R269 Unspecified abnormalities of gait and mobility: Secondary | ICD-10-CM | POA: Diagnosis present

## 2021-02-08 DIAGNOSIS — K922 Gastrointestinal hemorrhage, unspecified: Secondary | ICD-10-CM | POA: Diagnosis present

## 2021-02-08 DIAGNOSIS — Z8616 Personal history of COVID-19: Secondary | ICD-10-CM

## 2021-02-08 DIAGNOSIS — Z8673 Personal history of transient ischemic attack (TIA), and cerebral infarction without residual deficits: Secondary | ICD-10-CM

## 2021-02-08 DIAGNOSIS — Z79899 Other long term (current) drug therapy: Secondary | ICD-10-CM

## 2021-02-08 DIAGNOSIS — I130 Hypertensive heart and chronic kidney disease with heart failure and stage 1 through stage 4 chronic kidney disease, or unspecified chronic kidney disease: Secondary | ICD-10-CM | POA: Diagnosis present

## 2021-02-08 LAB — CBC
HCT: 32.4 % — ABNORMAL LOW (ref 39.0–52.0)
HCT: 26 % — ABNORMAL LOW (ref 39.0–52.0)
HCT: 24.9 % — ABNORMAL LOW (ref 39.0–52.0)
Hemoglobin: 9.9 g/dL — ABNORMAL LOW (ref 13.0–17.0)
Hemoglobin: 8.1 g/dL — ABNORMAL LOW (ref 13.0–17.0)
Hemoglobin: 7.9 g/dL — ABNORMAL LOW (ref 13.0–17.0)
MCH: 30.9 pg (ref 26.0–34.0)
MCH: 31.5 pg (ref 26.0–34.0)
MCH: 31.6 pg (ref 26.0–34.0)
MCHC: 30.6 g/dL (ref 30.0–36.0)
MCHC: 31.2 g/dL (ref 30.0–36.0)
MCHC: 31.7 g/dL (ref 30.0–36.0)
MCV: 101.3 fL — ABNORMAL HIGH (ref 80.0–100.0)
MCV: 101.2 fL — ABNORMAL HIGH (ref 80.0–100.0)
MCV: 99.6 fL (ref 80.0–100.0)
Platelets: 279 10*3/uL (ref 150–400)
Platelets: 224 10*3/uL (ref 150–400)
Platelets: 204 10*3/uL (ref 150–400)
RBC: 3.2 MIL/uL — ABNORMAL LOW (ref 4.22–5.81)
RBC: 2.57 MIL/uL — ABNORMAL LOW (ref 4.22–5.81)
RBC: 2.5 MIL/uL — ABNORMAL LOW (ref 4.22–5.81)
RDW: 17.9 % — ABNORMAL HIGH (ref 11.5–15.5)
RDW: 17.7 % — ABNORMAL HIGH (ref 11.5–15.5)
RDW: 18 % — ABNORMAL HIGH (ref 11.5–15.5)
WBC: 8.5 10*3/uL (ref 4.0–10.5)
WBC: 9.4 10*3/uL (ref 4.0–10.5)
WBC: 9.7 10*3/uL (ref 4.0–10.5)
nRBC: 0 % (ref 0.0–0.2)
nRBC: 0 % (ref 0.0–0.2)
nRBC: 0 % (ref 0.0–0.2)

## 2021-02-08 LAB — COMPREHENSIVE METABOLIC PANEL
ALT: 12 U/L (ref 0–44)
AST: 25 U/L (ref 15–41)
Albumin: 3.6 g/dL (ref 3.5–5.0)
Alkaline Phosphatase: 93 U/L (ref 38–126)
Anion gap: 10 (ref 5–15)
BUN: 22 mg/dL (ref 8–23)
CO2: 30 mmol/L (ref 22–32)
Calcium: 9 mg/dL (ref 8.9–10.3)
Chloride: 101 mmol/L (ref 98–111)
Creatinine, Ser: 1.64 mg/dL — ABNORMAL HIGH (ref 0.61–1.24)
GFR, Estimated: 39 mL/min — ABNORMAL LOW (ref >60)
Glucose, Bld: 150 mg/dL — ABNORMAL HIGH (ref 70–99)
Potassium: 3.4 mmol/L — ABNORMAL LOW (ref 3.5–5.1)
Sodium: 141 mmol/L (ref 135–145)
Total Bilirubin: 0.6 mg/dL (ref 0.3–1.2)
Total Protein: 7 g/dL (ref 6.5–8.1)

## 2021-02-08 LAB — LIPASE, BLOOD: Lipase: 35 U/L (ref 11–51)

## 2021-02-08 LAB — MAGNESIUM: Magnesium: 2 mg/dL (ref 1.7–2.4)

## 2021-02-08 LAB — TROPONIN I (HIGH SENSITIVITY): Troponin I (High Sensitivity): 3825 ng/L (ref <18)

## 2021-02-08 LAB — POC OCCULT BLOOD, ED: Fecal Occult Bld: NEGATIVE

## 2021-02-08 MED ORDER — ACETAMINOPHEN 325 MG PO TABS: 650.0000 mg | ORAL_TABLET | Freq: Four times a day (QID) | ORAL | Status: DC | PRN

## 2021-02-08 MED ORDER — ONDANSETRON HCL 4 MG/2ML IJ SOLN
4.0000 mg | Freq: Once | INTRAMUSCULAR | Status: AC
  Administered 2021-02-08: 4 mg via INTRAVENOUS
  Filled 2021-02-08: qty 2

## 2021-02-08 MED ORDER — ONDANSETRON HCL 4 MG/2ML IJ SOLN: 4.0000 mg | Freq: Four times a day (QID) | INTRAMUSCULAR | Status: DC | PRN

## 2021-02-08 MED ORDER — SODIUM CHLORIDE 0.9 % IV BOLUS
500.0000 mL | Freq: Once | INTRAVENOUS | Status: AC
  Administered 2021-02-08: 500 mL via INTRAVENOUS

## 2021-02-08 MED ORDER — ACETAMINOPHEN 650 MG RE SUPP: 650.0000 mg | Freq: Four times a day (QID) | RECTAL | Status: DC | PRN

## 2021-02-08 MED ORDER — PROCHLORPERAZINE EDISYLATE 10 MG/2ML IJ SOLN: 10.0000 mg | Freq: Four times a day (QID) | INTRAMUSCULAR | Status: DC | PRN

## 2021-02-08 MED ORDER — PANTOPRAZOLE SODIUM 40 MG IV SOLR
40.0000 mg | Freq: Two times a day (BID) | INTRAVENOUS | Status: DC
  Administered 2021-02-08 – 2021-02-11 (×6): 40 mg via INTRAVENOUS
  Filled 2021-02-08 (×6): qty 40

## 2021-02-08 MED ORDER — IOHEXOL 300 MG/ML  SOLN
70.0000 mL | Freq: Once | INTRAMUSCULAR | Status: AC | PRN
  Administered 2021-02-08: 70 mL via INTRAVENOUS

## 2021-02-08 MED ORDER — PANTOPRAZOLE SODIUM 40 MG IV SOLR
40.0000 mg | Freq: Once | INTRAVENOUS | Status: AC
  Administered 2021-02-08: 40 mg via INTRAVENOUS
  Filled 2021-02-08: qty 40

## 2021-02-08 MED ORDER — LACTATED RINGERS IV SOLN: INTRAVENOUS | Status: AC

## 2021-02-08 MED ORDER — SODIUM CHLORIDE 0.9 % IV BOLUS: 1000.0000 mL | Freq: Once | INTRAVENOUS | Status: DC

## 2021-02-08 MED ORDER — POTASSIUM CHLORIDE 10 MEQ/100ML IV SOLN
10.0000 meq | Freq: Once | INTRAVENOUS | Status: AC
  Administered 2021-02-08: 10 meq via INTRAVENOUS
  Filled 2021-02-08: qty 100

## 2021-02-08 MED ORDER — ONDANSETRON HCL 4 MG PO TABS: 4.0000 mg | ORAL_TABLET | Freq: Four times a day (QID) | ORAL | Status: DC | PRN

## 2021-02-08 NOTE — Progress Notes (Signed)
Pt currently in ED, do not charge for visit.

## 2021-02-08 NOTE — H&P (Addendum)
History and Physical    Jon Reid MVE:[id number] DOB: 10-26-1928 DOA: 02/08/2021  PCP: Claretta Fraise, MD Consultants:  cardiology: Dr. Percival Spanish, urology: Dr. Junious Silk, GI: Dr. Collene Mares, neurology: Dr. Rexene Alberts Patient coming from:  Home - lives with his wife and 2 kids.   Chief Complaint: vomiting  HPI: Jon Reid is a 87 y.o. male with medical history significant of CKD stage III, CAD s/p CABGx4, hx of CVA, hx of first degree heart block, GERD, HTN, hx of prostate cancer.  He also has a history of upper GI bleeds and acute blood loss anemia due to LA grade D esophagitis-last EGD in 09/2020 who presented  to Ed with one night of vomiting, black colored vomit. He states he had constant vomiting all night long. He vomited in the ambulance and this AM, but has tapered off and he has not had any more today. He denies any abdominal pain or diarrhea. He states his stool is dark in color. He denies any recent  NSAID use, alcohol use.   He denies any fever/chills, lightheadedness and dizziness, chest pain or palpitations, but has tightness at times that seems worse than normal, no dysuria, frequency or urgency, leg swelling.   He is currently in PT and last went yesterday.  History of prolonged rehab admission in September 2022 after hospitalization in August 2022 for fall with right femur fracture fracture status post right hip hemiarthroplasty.  ED Course: vitals: Afebrile, blood pressure 129/56, heart rate 79, respiratory rate 16, oxygen 100% on room air. Pertinent labs: Hemoglobin 9.9, potassium 3.4, creatinine 1.64, fecal occult negative,  CT abdomen pelvis with contrast: No acute findings, large hiatal hernia, fluid density structure in the right paracolic gutter has enlarged in the interval and may represent a lymphangioma or  GI duplication cyst In ED given 500 mL bolus and Protonix.  Also given Zofran and [facility] called and asked to admit.  Review of Systems: As per HPI; otherwise review of  systems reviewed and negative.   Ambulatory Status:  Ambulates with walker    Past Medical History:  Diagnosis Date   Arthritis    Carcinoma of prostate ([place])    prostate   CKD (chronic kidney disease), stage III ([place])    Coronary artery disease    a. 10/2012 Cath: LM 20-30d, lAD 80p, 52m, 80-90d, LCX 90-6m, OM1 90, RCA 50p, 48m, 50d, RPDA 90, EF nl; b. 10/2012 CABG x 4 (LIMA-LAD, SVG-OM1, SVG-PDA->RPL); c. 07/2015 MV: Basal and proximal septal infarct w/o ischemia. EF 57%-->low risk.   First degree AV block 01/28/2019   Noted on EKG   GERD (gastroesophageal reflux disease)    Gout    HTN (hypertension)    Macrocytosis without anemia 01/08/2012   Moderate aortic stenosis    a. 10/2012 Echo: EF 55-60%, no rwma, BAE, mild AI, mild TR; b. 06/2020 Echo: EF 50-55%, no rwma, mild LVH. Mod dil LA. Mild MR. Mod AS.   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   Sleep apnea    Questionable   Spinal stenosis    Stroke [facility])     Past Surgical History:  Procedure Laterality Date   BALLOON DILATION N/A 10/03/2020   Procedure: BALLOON DILATION;  Surgeon: [name], DO;  [facility];  Service: Endoscopy;  Laterality: N/A;   CARDIAC CATHETERIZATION  11/07/2012   Dr Acie Fredrickson   CATARACT EXTRACTION W/  INTRAOCULAR LENS IMPLANT Bilateral    CORONARY ARTERY BYPASS GRAFT N/A 11/11/2012   Procedure: CORONARY ARTERY BYPASS GRAFTING times four using Right Greater Saphenous Vein Graft harvested endoscopically and Left Internal Mammary Artery.;  Surgeon: Ivin Poot, MD;  Location: [place];  Service: Open Heart Surgery;  Laterality: N/A;   ESOPHAGEAL BRUSHING  10/03/2020   Procedure: ESOPHAGEAL BRUSHING;  Surgeon: [name], DO;  [facility];  Service: Endoscopy;;   ESOPHAGOGASTRODUODENOSCOPY (EGD) WITH PROPOFOL N/A 06/27/2020   Surgeon: [name], DO;  large hiatal hernia, LA grade D  esophagitis with no bleeding, normal examined duodenum.    ESOPHAGOGASTRODUODENOSCOPY (EGD) WITH PROPOFOL N/A 10/03/2020   Procedure: ESOPHAGOGASTRODUODENOSCOPY (EGD) WITH PROPOFOL;  Surgeon: [name], DO;  [facility];  Service: Endoscopy;  Laterality: N/A;  2:00pm   HIP ARTHROPLASTY Right 11/13/2020   Procedure: ARTHROPLASTY BIPOLAR HIP (HEMIARTHROPLASTY);  Surgeon: Renette Butters, MD;  Location: [place];  Service: Orthopedics;  Laterality: Right;   INGUINAL HERNIA REPAIR Right 01/10/2017   Procedure: OPEN RIGHT HERNIA REPAIR INGUINAL;  Surgeon: Ileana Roup, MD;  Location[facility];  Service: General;  Laterality: Right;   INSERTION OF MESH Right 01/10/2017   Procedure: INSERTION OF MESH;  Surgeon: Ileana Roup, MD;  Location: [facility];  Service: General;  Laterality: Right;   INTRAOPERATIVE TRANSESOPHAGEAL ECHOCARDIOGRAM N/A 11/11/2012   Procedure: INTRAOPERATIVE TRANSESOPHAGEAL ECHOCARDIOGRAM;  Surgeon: Ivin Poot, MD;  Location: [place];  Service: Open Heart Surgery;  Laterality: N/A;   LEFT HEART CATHETERIZATION WITH CORONARY ANGIOGRAM N/A 11/07/2012   Procedure: LEFT HEART CATHETERIZATION WITH CORONARY ANGIOGRAM;  Surgeon: Thayer Headings, MD;  Location: [facility];  Service: Cardiovascular;  Laterality: N/A;   LUMBAR LAMINECTOMY/DECOMPRESSION MICRODISCECTOMY N/A 02/05/2019   Procedure: LUMBAR LAMINECTOMY/DECOMPRESSION L3-L4;  Surgeon: Latanya Maudlin, MD;  Location: [facility];  Service: Orthopedics;  Laterality: N/A;  46min   LYMPH NODE DISSECTION     Bilateral pelvic   RETROPUBIC PROSTATECTOMY     Radical    Social History   Socioeconomic History   Marital status: Married    Spouse name: [name]   Number of children: 3   Years of education: 12   Highest education level: High school graduate  Occupational History   Occupation: Retired  Tobacco Use   Smoking status: Never   Smokeless tobacco: Never  Scientific laboratory technician Use: Never used   Substance and Sexual Activity   Alcohol use: Yes    Comment: Rare   Drug use: No   Sexual activity: Not Currently  Other Topics Concern   Not on file  Social History Narrative   Lives at home with wife/sons will stay with them occassionally   Married with 3 children   Denies caffeine use    Social Determinants of Radio broadcast assistant Strain: Not on file  Food Insecurity: Not on file  Transportation Needs: Not on file  Physical Activity: Not on file  Stress: Not on file  Social Connections: Not on file  Intimate Partner Violence: Not on file    Allergies  Allergen Reactions   Crestor [Rosuvastatin] Swelling    GYNECOMASTIA    Trazodone And Nefazodone     Hallucinations    Family History  Problem Relation Age of Onset   Aneurysm Father        Cerebral   Stroke Father    Hypertension Father    Cancer Mother  colon   Parkinson's disease Son     Prior to Admission medications   Medication Sig Start Date End Date Taking? Authorizing Provider  ascorbic acid (VITAMIN C) 500 MG tablet Take 1 tablet (500 mg total) by mouth daily. 06/30/20   Barton Dubois, MD  aspirin 81 MG EC tablet Take 1 tablet (81 mg total) by mouth daily. Swallow whole. 12/08/20   Love, Ivan Anchors, PA-C  carbidopa-levodopa (SINEMET IR) 25-100 MG tablet Take 1.5 tablets by mouth 3 (three) times daily. 09/21/20   Kathrynn Ducking, MD  Cholecalciferol (VITAMIN D) 2000 units CAPS Take 2,000 Units by mouth daily.    [provider]  Dextromethorphan-guaiFENesin 10-100 MG/5ML liquid Take 5 mLs by mouth every 6 (six) hours. 12/07/20   Love, Ivan Anchors, PA-C  docusate sodium (COLACE) 100 MG capsule Take 1 capsule (100 mg total) by mouth 2 (two) times daily. 12/07/20   Love, Ivan Anchors, PA-C  fluticasone (FLONASE) 50 MCG/ACT nasal spray One to 2 sprays each nostril at bedtime Patient taking differently: Place 2 sprays into both nostrils at bedtime. 10/12/14   Chipper Herb, MD  furosemide (LASIX)  40 MG tablet Take 1 tablet (40 mg total) by mouth 2 (two) times daily. One at breakfast. The other with lunch 01/18/21   Claretta Fraise, MD  Iron, Ferrous Sulfate, 325 (65 Fe) MG TABS Take 1 tablet by mouth daily. 12/14/20   Claretta Fraise, MD  melatonin 5 MG TABS Take 1 tablet (5 mg total) by mouth at bedtime. 12/07/20   Love, Ivan Anchors, PA-C  Multiple Vitamins-Minerals (CERTAVITE/ANTIOXIDANTS) TABS Take 1 tablet by mouth daily. 12/08/20   Love, Ivan Anchors, PA-C  nitroGLYCERIN (NITROSTAT) 0.4 MG SL tablet Place 1 tablet (0.4 mg total) under the tongue every 5 (five) minutes as needed for chest pain. 07/20/15   Erlene Quan, PA-C  pantoprazole (PROTONIX) 40 MG tablet Take 1 tablet (40 mg total) by mouth 2 (two) times daily. 12/07/20   Love, Ivan Anchors, PA-C  potassium chloride SA (KLOR-CON) 10 MEQ tablet Take 2 tablets (20 mEq total) by mouth daily. For potassium replacement/ supplement 01/18/21 04/18/21  Claretta Fraise, MD  senna-docusate (SENOKOT-S) 8.6-50 MG tablet Take 2 tablets by mouth daily with supper. 12/08/20   Love, Ivan Anchors, PA-C  triamcinolone cream (KENALOG) 0.1 % Apply 1 application topically daily as needed (itching). 06/29/20   Barton Dubois, MD    Physical Exam: Vitals:   02/08/21 1645 02/08/21 1655 02/08/21 1800 02/08/21 1915  BP: 108/74  108/78 98/75  Pulse: 90 93 92 89  Resp: (!) 22 20 17 19   Temp:      TempSrc:      SpO2: 100% 100% 94% 100%  Weight:      Height:         General:  Appears calm and comfortable and is in NAD. Dark brown/black vomit that is dried on his hospital gown  Eyes:  PERRL, EOMI, normal lids, iris ENT:  grossly normal hearing, lips & tongue, mmm; appropriate dentition Neck:  no LAD, masses or thyromegaly; no carotid bruits Cardiovascular:  RRR, systolic murmur. Trace bilateral LE edema  Respiratory:   CTA bilaterally with no wheezes/rales/rhonchi.  Normal respiratory effort. Abdomen:  soft, NT, ND, NABS Back:   normal alignment, no CVAT Skin:  no rash or  induration seen on limited exam. Bruise to left lateral upper leg.  Musculoskeletal:  grossly normal tone BUE/BLE, good ROM, no bony abnormality Lower extremity:  Limited foot exam  with no ulcerations.  2+ distal pulses. Psychiatric:  grossly normal mood and affect, speech fluent and appropriate, AOx3 Neurologic:  CN 2-12 grossly intact, moves all extremities in coordinated fashion, sensation intact    Radiological Exams on Admission: Independently reviewed - see discussion in A/P where applicable  CT ABDOMEN PELVIS W CONTRAST  Result Date: 02/08/2021 CLINICAL DATA:  Nausea and vomiting. EXAM: CT ABDOMEN AND PELVIS WITH CONTRAST TECHNIQUE: Multidetector CT imaging of the abdomen and pelvis was performed using the standard protocol following bolus administration of intravenous contrast. CONTRAST:  49mL OMNIPAQUE IOHEXOL 300 MG/ML  SOLN COMPARISON:  10/19/2019. FINDINGS: Lower chest: Lung bases show volume loss in both lower lobes. Very large hiatal hernia. Atherosclerotic calcification of the aorta and aortic valve. Heart is at the upper limits of normal in size. No pericardial or pleural effusion. Hepatobiliary: Liver and gallbladder are unremarkable. No biliary ductal dilatation. Pancreas: Negative. Spleen: Negative. Adrenals/Urinary Tract: Adrenal glands are unremarkable. Probable small right renal sinus cyst. Additional low-attenuation lesions in the kidneys measure up to 2.8 cm off the lower pole right kidney and are likely cysts. Ureters are decompressed. Bladder is largely obscured by streak artifact from a right hip arthroplasty. Stomach/Bowel: Large hiatal hernia. Stomach, small bowel, appendix and majority of the colon are unremarkable. Fair amount of stool is seen in the descending and rectosigmoid colon. Vascular/Lymphatic: Atherosclerotic calcification of the aorta. No pathologically enlarged lymph nodes. Reproductive: Obscured by streak artifact from a right hip arthroplasty. Other: No  free fluid. Fluid density structure in the right paracolic gutter measures 4.3 x 6.5 cm (3/51), slightly enlarged from 3.1 x 4.6 cm on 10/19/2019. Mesenteries and peritoneum are otherwise unremarkable. Musculoskeletal: Right hip arthroplasty. Degenerative changes in the spine. Grade 1 anterolisthesis of L4 on L5. L3 inferior endplate compression fracture is old. IMPRESSION: 1. No acute findings. 2. Large hiatal hernia. 3. Fluid density structure in the right paracolic gutter has enlarged in the interval and may represent a lymphangioma or GI duplication cyst. 4.  Aortic atherosclerosis (ICD10-I70.0). Electronically Signed   By: Lorin Picket M.D.   On: 02/08/2021 15:39    EKG: Independently reviewed.  NSR with rate 88, RBBB. Prolonged qt ; nonspecific ST changes with no evidence of acute ischemia   Labs on Admission: I have personally reviewed the available labs and imaging studies at the time of the admission.  Pertinent labs:  Hemoglobin 9.9,  potassium 3.4,  creatinine 1.64,  fecal occult negative,    Assessment/Plan Principal Problem:   Upper GI bleed -87 year old male presenting with nausea and vomiting with coffee-ground emesis, drop in hemoglobin, with known history of upper GI bleed secondary to grade D esophagitis -Place in progressive -GI has been consulted and will see in a.m. -Protonix 40 mg IV twice daily. Has not had any vomiting since this AM.  -clear Liquid diet -Gentle IV fluids -cbc Every 6 hours. Transfuse if hgb <7.0  -repeat fecal occult  -Interesting his fecal occult is negative and his BUN is not elevated however he has visible black dried emesis on his gown and significant drop in hemoglobin  Active Problems:   Acute renal failure superimposed on stage 3 chronic kidney disease (HCC) -Likely prerenal in setting of vomiting -baseline creatinine: (1.08-1.21) three weeks ago creatinine 1.6. may also have worsening baseline.  -Received small bolus and will continue  gentle IV fluid hydration -Avoid nephrotoxic drugs-hold home lasix  -Follow BMP in a.m.    Hypokalemia -Checking magnesium and replacing potassium -Repeat BMP  in a.m.    Prolonged QT interval -New finding-on telemetry -rePlace electrolytes -Avoid QT prolonging drugs -Repeat EKG in a.m.    Coronary artery disease involving native coronary artery of native heart without angina pectoris -has had chest tightness, troponin pending. No active chest pain  -no acute ST changes on EKG -on telemetry -negative stress perfusion study in 2017. Hx of CABG in 2014.  -continue ASA when bleed ruled out     Benign essential hypertension -hypotensive/soft readings.  -no blood pressure medication in MAR, but has not been done.     Hyperlipemia -does not appear to be on statin, follows with cardiology. Allergy to crestor   Gait abnormality Followed outpatient by neurology.  Did a trial of Sinemet and did not believe it helped discontinued.  Believe gait disorder multifactorial secondary to normal aging, and degenerative spine arthritis anemia and deconditioning. -already receiving outpatient PT  Body mass index is 21.79 kg/m.  MAR will need to be completed tomorrow.   Level of care: Progressive DVT prophylaxis:   TED hose  Code Status:  Full - confirmed with patient/family Family Communication: son present at bedside: lynn Surratt  Disposition Plan:  The patient is from: home  Anticipated d/c is to: per day team Patient placed in observation as anticipate length of stay less than 2 midnights.  Requires hospitalization for GI bleed, IV medication, constant monitoring and MDM with specialist.  Patient is currently: stable  Consults called: GI  Admission status:  observation   Dragon dictation used in completing this note.   Orma Flaming MD [facility]   How to contact the [facility] Attending or Consulting provider [place] or covering provider during after hours [place], for this  patient?  Check the care team in [facility] and look for a) attending/consulting [facility] provider listed and b) the [facility] team listed Log into [email or web address] and use [place]'s universal password to access. If you do not have the password, please contact the hospital operator. Locate the [facility] provider you are looking for under [facility] and page to a number that you can be directly reached. If you still have difficulty reaching the provider, please page the [facility] (Director on Call) for the Hospitalists listed on amion for assistance.   02/08/2021, 8:49 PM

## 2021-02-08 NOTE — Progress Notes (Signed)
[facility] night shift PCU coverage note.  The nursing staff reported that the patient's troponin was 3825 ng/L.  He was admitted for upper GI bleed and was complaining of chest tightness.  I have discussed the case with Dr. Edison Simon ye, who is on-call for cardiology tonight.  He will take a look at the case.  Tennis Must, MD.

## 2021-02-08 NOTE — ED Triage Notes (Signed)
Pt bib [facility] from home with complaints of vomiting up "dark liquid" that started yesterday. Denies pain and complains of nausea.  EMS vitals: 178/79, 67HR, 16R, 99%RA, 205CBG, 98T

## 2021-02-08 NOTE — ED Notes (Signed)
RN cleaned pt, applied new chuck, diaper, and condom cath. New warm blankets given and new bed sheet.

## 2021-02-08 NOTE — ED Provider Notes (Signed)
[facility] EMERGENCY DEPARTMENT Provider Note   CSN: [id number] Arrival date & time: 02/08/21  1200     History No chief complaint on file.   Jon Reid is a 87 y.o. male.  HPI  87 year old male with a history of prostate cancer, CKD, CAD, first-degree AV block, GERD, gout, hypertension, aortic stenosis, CABG, sleep apnea, spinal stenosis, CVA, who presents emergency department today for evaluation of nausea and vomiting.  States that he had some dark emesis that looked "like I chewed up tobacco and spit it out".  He denies any abdominal pain or chest pain.  He has chronic shortness of breath with his activities which is unchanged.  Does not report any diarrhea, constipation or fevers.  He does not know if he is anticoagulated.  Past Medical History:  Diagnosis Date   Arthritis    Carcinoma of prostate ([place])    prostate   CKD (chronic kidney disease), stage III (HCC)    Coronary artery disease    a. 10/2012 Cath: LM 20-30d, lAD 80p, 69m, 80-90d, LCX 90-22m, OM1 90, RCA 50p, 73m, 50d, RPDA 90, EF nl; b. 10/2012 CABG x 4 (LIMA-LAD, SVG-OM1, SVG-PDA->RPL); c. 07/2015 MV: Basal and proximal septal infarct w/o ischemia. EF 57%-->low risk.   First degree AV block 01/28/2019   Noted on EKG   GERD (gastroesophageal reflux disease)    Gout    HTN (hypertension)    Macrocytosis without anemia 01/08/2012   Moderate aortic stenosis    a. 10/2012 Echo: EF 55-60%, no rwma, BAE, mild AI, mild TR; b. 06/2020 Echo: EF 50-55%, no rwma, mild LVH. Mod dil LA. Mild MR. Mod AS.   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   Sleep apnea    Questionable   Spinal stenosis    Stroke [facility])     Patient Active Problem List   Diagnosis Date Noted   Bifascicular block 12/20/2020   Cough 12/12/2020   AKI (acute kidney injury) ([place])    Hypokalemia    Acute on chronic anemia    Protein-calorie malnutrition,  severe 11/26/2020   Pressure injury of skin 11/17/2020   Malnutrition of moderate degree 11/15/2020   S/P hip hemiarthroplasty    Closed right hip fracture, initial encounter ([place])    Femur fracture, right ([place]) 11/11/2020   Frail elderly 11/08/2020   Dizziness 09/23/2020   History of GI bleed 08/19/2020   Loss of weight 08/19/2020   Dysphagia 08/19/2020   Upper GI bleed 06/25/2020   COVID-19 virus infection 06/25/2020   [name] affect 02/09/2020   Iron deficiency anemia due to chronic blood loss 05/27/2019   Nonrheumatic mitral valve regurgitation 05/26/2019   Left anterior fascicular block 05/26/2019   Goals of care, counseling/discussion 04/06/2019   Constipation 02/19/2019   Elevated troponin 02/09/2019   Fecal occult blood test positive 02/09/2019   Spinal stenosis, lumbar region with neurogenic claudication 02/05/2019   Leg swelling 01/26/2019   Nonrheumatic aortic valve stenosis 01/26/2019   Multiple joint pain 12/11/2018   Chronic pain of both knees 11/07/2018   Vitamin D deficiency 10/23/2018   Gastroesophageal reflux disease with esophagitis 03/13/2018   Murmur, cardiac 12/04/2017   Coronary artery disease involving native coronary artery of native heart without angina pectoris 12/04/2017   PVC's (premature ventricular contractions) 12/04/2017   Heart disease 08/21/2017   Weakness 11/14/2016   Abnormality of gait 12/06/2015  Abdominal aortic atherosclerosis ([place]) 12/01/2015   Degeneration of lumbar intervertebral disc 12/01/2015   Exertional angina ([place]) 07/20/2015   Hernia of abdominal wall 10/12/2014   Gynecomastia 04/09/2013   Hx of CABG 02/02/2013   Myelodysplastic syndrome 11/06/2012   Vitamin B12 deficiency 07/07/2012   Hyperlipemia 07/07/2012   Peripheral vascular disease ([place]) 04/14/2009   CARCINOMA, PROSTATE 04/13/2009   Non-thrombocytopenic purpura ([place]) 04/13/2009   Benign essential hypertension 04/13/2009   Chronic renal insufficiency, stage  III (moderate) ([place]) 04/13/2009   SLEEP APNEA 04/13/2009    Past Surgical History:  Procedure Laterality Date   BALLOON DILATION N/A 10/03/2020   Procedure: BALLOON DILATION;  Surgeon: [name], DO;  [facility];  Service: Endoscopy;  Laterality: N/A;   CARDIAC CATHETERIZATION  11/07/2012   Dr Acie Fredrickson   CATARACT EXTRACTION W/ INTRAOCULAR LENS IMPLANT Bilateral    CORONARY ARTERY BYPASS GRAFT N/A 11/11/2012   Procedure: CORONARY ARTERY BYPASS GRAFTING times four using Right Greater Saphenous Vein Graft harvested endoscopically and Left Internal Mammary Artery.;  Surgeon: Ivin Poot, MD;  Location: [place];  Service: Open Heart Surgery;  Laterality: N/A;   ESOPHAGEAL BRUSHING  10/03/2020   Procedure: ESOPHAGEAL BRUSHING;  Surgeon: [name], DO;  [facility];  Service: Endoscopy;;   ESOPHAGOGASTRODUODENOSCOPY (EGD) WITH PROPOFOL N/A 06/27/2020   Surgeon: [name], DO;  large hiatal hernia, LA grade D esophagitis with no bleeding, normal examined duodenum.    ESOPHAGOGASTRODUODENOSCOPY (EGD) WITH PROPOFOL N/A 10/03/2020   Procedure: ESOPHAGOGASTRODUODENOSCOPY (EGD) WITH PROPOFOL;  Surgeon: [name], DO;  [facility];  Service: Endoscopy;  Laterality: N/A;  2:00pm   HIP ARTHROPLASTY Right 11/13/2020   Procedure: ARTHROPLASTY BIPOLAR HIP (HEMIARTHROPLASTY);  Surgeon: Renette Butters, MD;  Location: [place];  Service: Orthopedics;  Laterality: Right;   INGUINAL HERNIA REPAIR Right 01/10/2017   Procedure: OPEN RIGHT HERNIA REPAIR INGUINAL;  Surgeon: Ileana Roup, MD;  Location: [facility];  Service: General;  Laterality: Right;   INSERTION OF MESH Right 01/10/2017   Procedure: INSERTION OF MESH;  Surgeon: Ileana Roup, MD;  Location: [facility];  Service: General;  Laterality: Right;   INTRAOPERATIVE TRANSESOPHAGEAL ECHOCARDIOGRAM N/A 11/11/2012   Procedure: INTRAOPERATIVE TRANSESOPHAGEAL ECHOCARDIOGRAM;  Surgeon: Ivin Poot, MD;  Location: [place];  Service: Open Heart Surgery;  Laterality: N/A;   LEFT HEART CATHETERIZATION WITH CORONARY ANGIOGRAM N/A 11/07/2012   Procedure: LEFT HEART CATHETERIZATION WITH CORONARY ANGIOGRAM;  Surgeon: Thayer Headings, MD;  Location[facility] CATH LAB;  Service: Cardiovascular;  Laterality: N/A;   LUMBAR LAMINECTOMY/DECOMPRESSION MICRODISCECTOMY N/A 02/05/2019   Procedure: LUMBAR LAMINECTOMY/DECOMPRESSION L3-L4;  Surgeon: Latanya Maudlin, MD;  Location[facility];  Service: Orthopedics;  Laterality: N/A;  37min   LYMPH NODE DISSECTION     Bilateral pelvic   RETROPUBIC PROSTATECTOMY     Radical       Family History  Problem Relation Age of Onset   Aneurysm Father        Cerebral   Stroke Father    Hypertension Father    Cancer Mother        colon   Parkinson's disease Son     Social History   Tobacco Use   Smoking status: Never   Smokeless tobacco: Never  Vaping Use   Vaping Use: Never used  Substance Use Topics   Alcohol use: Yes    Comment: Rare   Drug use: No    Home Medications Prior  to Admission medications   Medication Sig Start Date End Date Taking? Authorizing Provider  ascorbic acid (VITAMIN C) 500 MG tablet Take 1 tablet (500 mg total) by mouth daily. 06/30/20   Barton Dubois, MD  aspirin 81 MG EC tablet Take 1 tablet (81 mg total) by mouth daily. Swallow whole. 12/08/20   Love, Ivan Anchors, PA-C  carbidopa-levodopa (SINEMET IR) 25-100 MG tablet Take 1.5 tablets by mouth 3 (three) times daily. 09/21/20   Kathrynn Ducking, MD  Cholecalciferol (VITAMIN D) 2000 units CAPS Take 2,000 Units by mouth daily.    [provider]  Dextromethorphan-guaiFENesin 10-100 MG/5ML liquid Take 5 mLs by mouth every 6 (six) hours. 12/07/20   Love, Ivan Anchors, PA-C  docusate sodium (COLACE) 100 MG capsule Take 1 capsule (100 mg total) by mouth 2 (two) times daily. 12/07/20   Love, Ivan Anchors, PA-C  fluticasone (FLONASE) 50 MCG/ACT nasal spray One to 2 sprays each nostril at  bedtime Patient taking differently: Place 2 sprays into both nostrils at bedtime. 10/12/14   Chipper Herb, MD  furosemide (LASIX) 40 MG tablet Take 1 tablet (40 mg total) by mouth 2 (two) times daily. One at breakfast. The other with lunch 01/18/21   Claretta Fraise, MD  Iron, Ferrous Sulfate, 325 (65 Fe) MG TABS Take 1 tablet by mouth daily. 12/14/20   Claretta Fraise, MD  melatonin 5 MG TABS Take 1 tablet (5 mg total) by mouth at bedtime. 12/07/20   Love, Ivan Anchors, PA-C  Multiple Vitamins-Minerals (CERTAVITE/ANTIOXIDANTS) TABS Take 1 tablet by mouth daily. 12/08/20   Love, Ivan Anchors, PA-C  nitroGLYCERIN (NITROSTAT) 0.4 MG SL tablet Place 1 tablet (0.4 mg total) under the tongue every 5 (five) minutes as needed for chest pain. 07/20/15   Erlene Quan, PA-C  pantoprazole (PROTONIX) 40 MG tablet Take 1 tablet (40 mg total) by mouth 2 (two) times daily. 12/07/20   Love, Ivan Anchors, PA-C  potassium chloride SA (KLOR-CON) 10 MEQ tablet Take 2 tablets (20 mEq total) by mouth daily. For potassium replacement/ supplement 01/18/21 04/18/21  Claretta Fraise, MD  senna-docusate (SENOKOT-S) 8.6-50 MG tablet Take 2 tablets by mouth daily with supper. 12/08/20   Love, Ivan Anchors, PA-C  triamcinolone cream (KENALOG) 0.1 % Apply 1 application topically daily as needed (itching). 06/29/20   Barton Dubois, MD    Allergies    Crestor [rosuvastatin] and Trazodone and nefazodone  Review of Systems   Review of Systems  Constitutional:  Negative for fever.  HENT:  Negative for ear pain and sore throat.   Eyes:  Negative for visual disturbance.  Respiratory:  Negative for cough and shortness of breath.   Cardiovascular:  Negative for chest pain and palpitations.  Gastrointestinal:  Positive for nausea and vomiting. Negative for abdominal pain, constipation and diarrhea.  Genitourinary:  Negative for dysuria and hematuria.  Musculoskeletal:  Negative for arthralgias and back pain.  Skin:  Negative for rash.  Neurological:   Negative for seizures and syncope.  All other systems reviewed and are negative.  Physical Exam Updated Vital Signs BP (!) 99/54   Pulse 91   Temp 97.7 F (36.5 C) (Axillary)   Resp 20   Ht 5\' 6"  (1.676 m)   Wt 61.2 kg   SpO2 100%   BMI 21.79 kg/m   Physical Exam Vitals and nursing note reviewed.  Constitutional:      General: He is not in acute distress.    Appearance: He is well-developed.  Comments: Dark brown dried emesis on gown  HENT:     Head: Normocephalic and atraumatic.  Eyes:     Conjunctiva/sclera: Conjunctivae normal.  Cardiovascular:     Rate and Rhythm: Normal rate and regular rhythm.     Heart sounds: Normal heart sounds. No murmur heard. Pulmonary:     Effort: Pulmonary effort is normal. No respiratory distress.     Breath sounds: No wheezing, rhonchi or rales.  Abdominal:     General: Bowel sounds are normal.     Palpations: Abdomen is soft.     Tenderness: There is no abdominal tenderness. There is no guarding or rebound.     Comments: Dark dried emesis around patients mouth  Musculoskeletal:        General: No swelling.     Cervical back: Neck supple.  Skin:    General: Skin is warm and dry.     Capillary Refill: Capillary refill takes less than 2 seconds.  Neurological:     Mental Status: He is alert.  Psychiatric:        Mood and Affect: Mood normal.    ED Results / Procedures / Treatments   Labs (all labs ordered are listed, but only abnormal results are displayed) Labs Reviewed  COMPREHENSIVE METABOLIC PANEL - Abnormal; Notable for the following components:      Result Value   Potassium 3.4 (*)    Glucose, Bld 150 (*)    Creatinine, Ser 1.64 (*)    GFR, Estimated 39 (*)    All other components within normal limits  CBC - Abnormal; Notable for the following components:   RBC 3.20 (*)    Hemoglobin 9.9 (*)    HCT 32.4 (*)    MCV 101.3 (*)    RDW 17.9 (*)    All other components within normal limits  LIPASE, BLOOD  POC OCCULT  BLOOD, ED  TYPE AND SCREEN    EKG None  Radiology CT ABDOMEN PELVIS W CONTRAST  Result Date: 02/08/2021 CLINICAL DATA:  Nausea and vomiting. EXAM: CT ABDOMEN AND PELVIS WITH CONTRAST TECHNIQUE: Multidetector CT imaging of the abdomen and pelvis was performed using the standard protocol following bolus administration of intravenous contrast. CONTRAST:  73mL OMNIPAQUE IOHEXOL 300 MG/ML  SOLN COMPARISON:  10/19/2019. FINDINGS: Lower chest: Lung bases show volume loss in both lower lobes. Very large hiatal hernia. Atherosclerotic calcification of the aorta and aortic valve. Heart is at the upper limits of normal in size. No pericardial or pleural effusion. Hepatobiliary: Liver and gallbladder are unremarkable. No biliary ductal dilatation. Pancreas: Negative. Spleen: Negative. Adrenals/Urinary Tract: Adrenal glands are unremarkable. Probable small right renal sinus cyst. Additional low-attenuation lesions in the kidneys measure up to 2.8 cm off the lower pole right kidney and are likely cysts. Ureters are decompressed. Bladder is largely obscured by streak artifact from a right hip arthroplasty. Stomach/Bowel: Large hiatal hernia. Stomach, small bowel, appendix and majority of the colon are unremarkable. Fair amount of stool is seen in the descending and rectosigmoid colon. Vascular/Lymphatic: Atherosclerotic calcification of the aorta. No pathologically enlarged lymph nodes. Reproductive: Obscured by streak artifact from a right hip arthroplasty. Other: No free fluid. Fluid density structure in the right paracolic gutter measures 4.3 x 6.5 cm (3/51), slightly enlarged from 3.1 x 4.6 cm on 10/19/2019. Mesenteries and peritoneum are otherwise unremarkable. Musculoskeletal: Right hip arthroplasty. Degenerative changes in the spine. Grade 1 anterolisthesis of L4 on L5. L3 inferior endplate compression fracture is old. IMPRESSION: 1. No acute  findings. 2. Large hiatal hernia. 3. Fluid density structure in the  right paracolic gutter has enlarged in the interval and may represent a lymphangioma or GI duplication cyst. 4.  Aortic atherosclerosis (ICD10-I70.0). Electronically Signed   By: Lorin Picket M.D.   On: 02/08/2021 15:39    Procedures Procedures   Medications Ordered in ED Medications  pantoprazole (PROTONIX) injection 40 mg (40 mg Intravenous Given 02/08/21 1354)  ondansetron (ZOFRAN) injection 4 mg (4 mg Intravenous Given 02/08/21 1354)  iohexol (OMNIPAQUE) 300 MG/ML solution 70 mL (70 mLs Intravenous Contrast Given 02/08/21 1514)  sodium chloride 0.9 % bolus 500 mL (500 mLs Intravenous New Bag/Given 02/08/21 1559)    ED Course  I have reviewed the triage vital signs and the nursing notes.  Pertinent labs & imaging results that were available during my care of the patient were reviewed by me and considered in my medical decision making (see chart for details).    MDM Rules/Calculators/A&P                          87 year old male presents the emergency department today for evaluation of nausea and vomiting.  Emesis was reportedly dark and appeared like coffee grounds.  Has history of GI bleed.  Per chart review he is not anticoagulated.  Reviewed/interpreted labs CBC with anemia, worse than last month CMP with mild hypokalemia, otherwise appears at baseline Lipase neg Hemoccult neg  CT abd/pelvis - 1. No acute findings. 2. Large hiatal hernia. 3. Fluid density structure in the right paracolic gutter has enlarged in the interval and may represent a lymphangioma or GI   duplication cyst. 4.  Aortic atherosclerosis   Pt with nv. Concern for false neg hemoccult as pts presentation is very concerning for upper gi bleed. He has a h/o of this and had dark emesis this AM. He has dark brown emesis on his gown that appears consistent with hematemesis. He had an episode of hypotension here and his anemia is worsening. He has a h/o similar presentations. It is possible that he may be having  an intermittent bleed. I do not feel he is appropriate for d/c. Ppi bolus and drip, initiated. Small fluid bolus was also initiated due to hypotension.   4:17 PM CONSULT with Dr. Rogers Blocker who accepts patient for admission  Secure chat sent to Dr. Tarri Glenn with [facility] who will consult on the patient tomorrow AM  Final Clinical Impression(s) / ED Diagnoses Final diagnoses:  Nausea and vomiting, unspecified vomiting type    Rx / DC Orders ED Discharge Orders     None        Rodney Booze, PA-C 02/08/21 1636    Horton, Alvin Critchley, DO 02/09/21 2255

## 2021-02-09 ENCOUNTER — Observation Stay (HOSPITAL_COMMUNITY): Payer: MEDICARE

## 2021-02-09 ENCOUNTER — Inpatient Hospital Stay (HOSPITAL_COMMUNITY): Payer: MEDICARE

## 2021-02-09 ENCOUNTER — Encounter (HOSPITAL_COMMUNITY): Payer: Self-pay | Admitting: Family Medicine

## 2021-02-09 DIAGNOSIS — I452 Bifascicular block: Secondary | ICD-10-CM | POA: Diagnosis present

## 2021-02-09 DIAGNOSIS — Z79890 Hormone replacement therapy (postmenopausal): Secondary | ICD-10-CM | POA: Diagnosis not present

## 2021-02-09 DIAGNOSIS — I959 Hypotension, unspecified: Secondary | ICD-10-CM | POA: Diagnosis present

## 2021-02-09 DIAGNOSIS — M1990 Unspecified osteoarthritis, unspecified site: Secondary | ICD-10-CM | POA: Diagnosis present

## 2021-02-09 DIAGNOSIS — I2510 Atherosclerotic heart disease of native coronary artery without angina pectoris: Secondary | ICD-10-CM | POA: Diagnosis present

## 2021-02-09 DIAGNOSIS — K2100 Gastro-esophageal reflux disease with esophagitis, without bleeding: Secondary | ICD-10-CM | POA: Diagnosis not present

## 2021-02-09 DIAGNOSIS — E860 Dehydration: Secondary | ICD-10-CM | POA: Diagnosis present

## 2021-02-09 DIAGNOSIS — R079 Chest pain, unspecified: Secondary | ICD-10-CM | POA: Diagnosis not present

## 2021-02-09 DIAGNOSIS — K449 Diaphragmatic hernia without obstruction or gangrene: Secondary | ICD-10-CM | POA: Diagnosis present

## 2021-02-09 DIAGNOSIS — Z8616 Personal history of COVID-19: Secondary | ICD-10-CM | POA: Diagnosis not present

## 2021-02-09 DIAGNOSIS — H5789 Other specified disorders of eye and adnexa: Secondary | ICD-10-CM | POA: Diagnosis not present

## 2021-02-09 DIAGNOSIS — N1831 Chronic kidney disease, stage 3a: Secondary | ICD-10-CM | POA: Diagnosis present

## 2021-02-09 DIAGNOSIS — N179 Acute kidney failure, unspecified: Secondary | ICD-10-CM | POA: Diagnosis present

## 2021-02-09 DIAGNOSIS — K922 Gastrointestinal hemorrhage, unspecified: Secondary | ICD-10-CM | POA: Diagnosis present

## 2021-02-09 DIAGNOSIS — K2101 Gastro-esophageal reflux disease with esophagitis, with bleeding: Secondary | ICD-10-CM | POA: Diagnosis present

## 2021-02-09 DIAGNOSIS — R7989 Other specified abnormal findings of blood chemistry: Secondary | ICD-10-CM | POA: Diagnosis not present

## 2021-02-09 DIAGNOSIS — G4730 Sleep apnea, unspecified: Secondary | ICD-10-CM | POA: Diagnosis present

## 2021-02-09 DIAGNOSIS — I10 Essential (primary) hypertension: Secondary | ICD-10-CM

## 2021-02-09 DIAGNOSIS — M109 Gout, unspecified: Secondary | ICD-10-CM | POA: Diagnosis present

## 2021-02-09 DIAGNOSIS — I130 Hypertensive heart and chronic kidney disease with heart failure and stage 1 through stage 4 chronic kidney disease, or unspecified chronic kidney disease: Secondary | ICD-10-CM | POA: Diagnosis present

## 2021-02-09 DIAGNOSIS — Z79899 Other long term (current) drug therapy: Secondary | ICD-10-CM | POA: Diagnosis not present

## 2021-02-09 DIAGNOSIS — M4800 Spinal stenosis, site unspecified: Secondary | ICD-10-CM | POA: Diagnosis present

## 2021-02-09 DIAGNOSIS — R112 Nausea with vomiting, unspecified: Secondary | ICD-10-CM | POA: Diagnosis not present

## 2021-02-09 DIAGNOSIS — E785 Hyperlipidemia, unspecified: Secondary | ICD-10-CM | POA: Diagnosis present

## 2021-02-09 DIAGNOSIS — K5900 Constipation, unspecified: Secondary | ICD-10-CM | POA: Diagnosis not present

## 2021-02-09 DIAGNOSIS — R269 Unspecified abnormalities of gait and mobility: Secondary | ICD-10-CM | POA: Diagnosis present

## 2021-02-09 DIAGNOSIS — E876 Hypokalemia: Secondary | ICD-10-CM | POA: Diagnosis present

## 2021-02-09 DIAGNOSIS — Z7982 Long term (current) use of aspirin: Secondary | ICD-10-CM | POA: Diagnosis not present

## 2021-02-09 DIAGNOSIS — I5032 Chronic diastolic (congestive) heart failure: Secondary | ICD-10-CM | POA: Diagnosis present

## 2021-02-09 DIAGNOSIS — D649 Anemia, unspecified: Secondary | ICD-10-CM | POA: Diagnosis present

## 2021-02-09 LAB — CBC
HCT: 26.5 % — ABNORMAL LOW (ref 39.0–52.0)
Hemoglobin: 8.4 g/dL — ABNORMAL LOW (ref 13.0–17.0)
MCH: 30.7 pg (ref 26.0–34.0)
MCHC: 31.7 g/dL (ref 30.0–36.0)
MCV: 96.7 fL (ref 80.0–100.0)
Platelets: 214 10*3/uL (ref 150–400)
RBC: 2.74 MIL/uL — ABNORMAL LOW (ref 4.22–5.81)
RDW: 18.1 % — ABNORMAL HIGH (ref 11.5–15.5)
WBC: 7.4 10*3/uL (ref 4.0–10.5)
nRBC: 0 % (ref 0.0–0.2)

## 2021-02-09 LAB — BASIC METABOLIC PANEL
Anion gap: 7 (ref 5–15)
Anion gap: 5 (ref 5–15)
BUN: 39 mg/dL — ABNORMAL HIGH (ref 8–23)
BUN: 34 mg/dL — ABNORMAL HIGH (ref 8–23)
CO2: 23 mmol/L (ref 22–32)
CO2: 24 mmol/L (ref 22–32)
Calcium: 8.1 mg/dL — ABNORMAL LOW (ref 8.9–10.3)
Calcium: 8.3 mg/dL — ABNORMAL LOW (ref 8.9–10.3)
Chloride: 110 mmol/L (ref 98–111)
Chloride: 111 mmol/L (ref 98–111)
Creatinine, Ser: 1.31 mg/dL — ABNORMAL HIGH (ref 0.61–1.24)
Creatinine, Ser: 1.34 mg/dL — ABNORMAL HIGH (ref 0.61–1.24)
GFR, Estimated: 52 mL/min — ABNORMAL LOW (ref >60)
GFR, Estimated: 50 mL/min — ABNORMAL LOW (ref >60)
Glucose, Bld: 95 mg/dL (ref 70–99)
Glucose, Bld: 132 mg/dL — ABNORMAL HIGH (ref 70–99)
Potassium: 3.7 mmol/L (ref 3.5–5.1)
Potassium: 3.6 mmol/L (ref 3.5–5.1)
Sodium: 140 mmol/L (ref 135–145)
Sodium: 140 mmol/L (ref 135–145)

## 2021-02-09 LAB — CBC WITH DIFFERENTIAL/PLATELET
Abs Immature Granulocytes: 0.03 10*3/uL (ref 0.00–0.07)
Basophils Absolute: 0.1 10*3/uL (ref 0.0–0.1)
Basophils Relative: 1 %
Eosinophils Absolute: 0.1 10*3/uL (ref 0.0–0.5)
Eosinophils Relative: 1 %
HCT: 26.6 % — ABNORMAL LOW (ref 39.0–52.0)
Hemoglobin: 8.7 g/dL — ABNORMAL LOW (ref 13.0–17.0)
Immature Granulocytes: 0 %
Lymphocytes Relative: 27 %
Lymphs Abs: 2.3 10*3/uL (ref 0.7–4.0)
MCH: 31.5 pg (ref 26.0–34.0)
MCHC: 32.7 g/dL (ref 30.0–36.0)
MCV: 96.4 fL (ref 80.0–100.0)
Monocytes Absolute: 0.7 10*3/uL (ref 0.1–1.0)
Monocytes Relative: 8 %
Neutro Abs: 5.4 10*3/uL (ref 1.7–7.7)
Neutrophils Relative %: 63 %
Platelets: 213 10*3/uL (ref 150–400)
RBC: 2.76 MIL/uL — ABNORMAL LOW (ref 4.22–5.81)
RDW: 18.4 % — ABNORMAL HIGH (ref 11.5–15.5)
WBC: 8.5 10*3/uL (ref 4.0–10.5)
nRBC: 0 % (ref 0.0–0.2)

## 2021-02-09 LAB — ECHOCARDIOGRAM COMPLETE
AR max vel: 1.01 cm2
AV Area VTI: 1.05 cm2
AV Area mean vel: 0.95 cm2
AV Mean grad: 23 mmHg
AV Peak grad: 40.2 mmHg
Ao pk vel: 3.17 m/s
Area-P 1/2: 2.93 cm2
Height: 66 in
MV M vel: 4.83 m/s
MV Peak grad: 93.3 mmHg
S' Lateral: 3.1 cm
Weight: 2160 oz

## 2021-02-09 LAB — MAGNESIUM: Magnesium: 2 mg/dL (ref 1.7–2.4)

## 2021-02-09 LAB — TROPONIN I (HIGH SENSITIVITY): Troponin I (High Sensitivity): 7183 ng/L (ref <18)

## 2021-02-09 LAB — PREPARE RBC (CROSSMATCH)

## 2021-02-09 LAB — RESP PANEL BY RT-PCR (FLU A&B, COVID) ARPGX2
Influenza A by PCR: NEGATIVE
Influenza B by PCR: NEGATIVE
SARS Coronavirus 2 by RT PCR: NEGATIVE

## 2021-02-09 LAB — BRAIN NATRIURETIC PEPTIDE: B Natriuretic Peptide: 1423.4 pg/mL — ABNORMAL HIGH (ref 0.0–100.0)

## 2021-02-09 MED ORDER — LACTATED RINGERS IV SOLN: INTRAVENOUS | Status: AC

## 2021-02-09 MED ORDER — SODIUM CHLORIDE 0.9% IV SOLUTION: Freq: Once | INTRAVENOUS | Status: DC

## 2021-02-09 NOTE — Plan of Care (Signed)
  Problem: Health Behavior/Discharge Planning: Goal: Ability to manage health-related needs will improve Outcome: Progressing   Problem: Activity: Goal: Risk for activity intolerance will decrease Outcome: Progressing   Problem: Pain Managment: Goal: General experience of comfort will improve Outcome: Progressing   Problem: Safety: Goal: Ability to remain free from injury will improve Outcome: Progressing   Problem: Skin Integrity: Goal: Risk for impaired skin integrity will decrease Outcome: Progressing

## 2021-02-09 NOTE — Progress Notes (Signed)
  Echocardiogram 2D Echocardiogram has been performed.  Johny Chess 02/09/2021, 4:17 PM

## 2021-02-09 NOTE — Progress Notes (Signed)
PROGRESS NOTE                                                                                                                                                                                                             Patient Demographics:    Jon Reid, is a 87 y.o. male, DOB - 03-29-1928, LAG:[id number]  Outpatient Primary MD for the patient is Claretta Fraise, MD    LOS - 0  Admit date - 02/08/2021    No chief complaint on file.      Brief Narrative (HPI from H&P)  Jon Reid is a 87 y.o. male with medical history significant of CKD stage III, CAD s/p CABGx4, hx of CVA, hx of first degree heart block, GERD, HTN, hx of prostate cancer.  He also has a history of upper GI bleeds and acute blood loss anemia due to LA grade D esophagitis-last EGD in 09/2020 who presented  to [facility] with one night of vomiting, black colored vomit with some dark stools.  Came to the ER where his Hemoccult stools interestingly were negative, BUN was unremarkable, he was found to have mild AKI, he never had chest pain but a troponin was checked in the ER which was high he was admitted for further work-up.   Subjective:    Georgeanna Lea today has, No headache, No chest pain, No abdominal pain - No Nausea, No new weakness tingling or numbness, no SOB   Assessment  & Plan :     Nausea vomiting with dark vomit and stool - he has history of esophagitis with previous upper GI bleed, currently BUN is stable, H&H has dropped mildly when accounted for heme dilution, he is already getting 1 unit of packed RBC this morning, GI on board on IV PPI we will continue to monitor.  2.  AKI on CKD 3A.  Baseline creatinine 1.2, AKI due to nausea vomiting dehydration, continue hydrating with IV fluids transfusion being given on 02/09/2021 Will monitor.  3.  Acute on chronic anemia, he has chronic anemia some anemia can be explained by him dilution and there could be  some upper GI blood loss as well.  See #1 above.  4.  CAD s/p CABG -he is chest pain-free, EKG has nonspecific changes but likely chronic, troponin noted echo pending, poor candidate for aspirin or Plavix  due to upper GI bleed.  Blood pressure too low for beta-blocker, has statin allergy hence no statin, will monitor with supportive care cardiology on board.  Poor candidate for invasive testing or procedures.  5.  Hypotension.  Monitor closely with midodrine if needed along with IV fluid boluses.  6.  Hypokalemia.  Replaced.          Condition - Extremely Guarded  Family Communication  : Called son Jeani Hawking [phone] 02/09/2021 at 7:54 AM and message left, another message left at 9:57 AM.  Code Status :  Full  Consults  :  GI, Cards  PUD Prophylaxis : PPI   Procedures  :     CT - 1. No acute findings. 2. Large hiatal hernia. 3. Fluid density structure in the right paracolic gutter has enlarged in the interval and may represent a lymphangioma or GI duplication cyst. 4.  Aortic atherosclerosis  TTE -      Disposition Plan  :    Status is: Observation  DVT Prophylaxis  :    Place and maintain sequential compression device Start: 02/09/21 0754 Place TED hose Start: 02/08/21 1739    Lab Results  Component Value Date   PLT 204 02/08/2021    Diet :  Diet Order             Diet clear liquid Room service appropriate? Yes; Fluid consistency: Thin  Diet effective now                    Inpatient Medications  Scheduled Meds:  sodium chloride   Intravenous Once   pantoprazole (PROTONIX) IV  40 mg Intravenous Q12H   Continuous Infusions:  lactated ringers     PRN Meds:.acetaminophen **OR** acetaminophen, prochlorperazine  Antibiotics  :    Anti-infectives (From admission, onward)    None        Time Spent in minutes  30   Lala Lund M.D on 02/09/2021 at 7:53 AM  To page go to [email or web address]   [facility] -  Office  [phone]  See  all Orders from today for further details    Objective:   Vitals:   02/09/21 0448 02/09/21 0608 02/09/21 0641 02/09/21 0739  BP: 92/62 (!) 88/48 (!) 88/51 (!) 88/53  Pulse: 61 63  62  Resp: 15  18 19   Temp: 97.8 F (36.6 C) 97.9 F (36.6 C) 97.9 F (36.6 C) 98.4 F (36.9 C)  TempSrc: Axillary Oral Oral Oral  SpO2: 100% 99%  99%  Weight:      Height:        Wt Readings from Last 3 Encounters:  02/08/21 61.2 kg  02/01/21 67 kg  01/26/21 67.2 kg     Intake/Output Summary (Last 24 hours) at 02/09/2021 0753 Last data filed at 02/09/2021 0739 Gross per 24 hour  Intake 969.92 ml  Output 350 ml  Net 619.92 ml     Physical Exam  Awake Alert, No new F.N deficits, Normal affect [place].AT,PERRAL Supple Neck, No JVD,   Symmetrical Chest wall movement, Good air movement bilaterally, CTAB RRR,No Gallops,Rubs or new Murmurs,  +ve B.Sounds, Abd Soft, No tenderness,   No Cyanosis, Clubbing or edema       Data Review:    CBC Recent Labs  Lab 02/08/21 1218 02/08/21 2117 02/08/21 2309  WBC 8.5 9.4 9.7  HGB 9.9* 8.1* 7.9*  HCT 32.4* 26.0* 24.9*  PLT 279 224 204  MCV 101.3* 101.2* 99.6  MCH 30.9 31.5  31.6  MCHC 30.6 31.2 31.7  RDW 17.9* 17.7* 18.0*    Electrolytes Recent Labs  Lab 02/08/21 1218 02/08/21 2117 02/09/21 0204  NA 141  --  140  K 3.4*  --  3.7  CL 101  --  110  CO2 30  --  23  GLUCOSE 150*  --  95  BUN 22  --  39*  CREATININE 1.64*  --  1.31*  CALCIUM 9.0  --  8.1*  AST 25  --   --   ALT 12  --   --   ALKPHOS 93  --   --   BILITOT 0.6  --   --   ALBUMIN 3.6  --   --   MG  --  2.0  --     ------------------------------------------------------------------------------------------------------------------ No results for input(s): CHOL, HDL, LDLCALC, TRIG, CHOLHDL, LDLDIRECT in the last 72 hours.  Lab Results  Component Value Date   HGBA1C 5.5% 05/31/2014    No results for input(s): TSH, T4TOTAL, T3FREE, THYROIDAB in the last 72  hours.  Invalid input(s): FREET3 ------------------------------------------------------------------------------------------------------------------ ID Labs Recent Labs  Lab 02/08/21 1218 02/08/21 2117 02/08/21 2309 02/09/21 0204  WBC 8.5 9.4 9.7  --   PLT 279 224 204  --   CREATININE 1.64*  --   --  1.31*   Cardiac Enzymes No results for input(s): CKMB, TROPONINI, MYOGLOBIN in the last 168 hours.  Invalid input(s): CK   Micro Results No results found for this or any previous visit (from the past 240 hour(s)).  Radiology Reports CT ABDOMEN PELVIS W CONTRAST  Result Date: 02/08/2021 CLINICAL DATA:  Nausea and vomiting. EXAM: CT ABDOMEN AND PELVIS WITH CONTRAST TECHNIQUE: Multidetector CT imaging of the abdomen and pelvis was performed using the standard protocol following bolus administration of intravenous contrast. CONTRAST:  74mL OMNIPAQUE IOHEXOL 300 MG/ML  SOLN COMPARISON:  10/19/2019. FINDINGS: Lower chest: Lung bases show volume loss in both lower lobes. Very large hiatal hernia. Atherosclerotic calcification of the aorta and aortic valve. Heart is at the upper limits of normal in size. No pericardial or pleural effusion. Hepatobiliary: Liver and gallbladder are unremarkable. No biliary ductal dilatation. Pancreas: Negative. Spleen: Negative. Adrenals/Urinary Tract: Adrenal glands are unremarkable. Probable small right renal sinus cyst. Additional low-attenuation lesions in the kidneys measure up to 2.8 cm off the lower pole right kidney and are likely cysts. Ureters are decompressed. Bladder is largely obscured by streak artifact from a right hip arthroplasty. Stomach/Bowel: Large hiatal hernia. Stomach, small bowel, appendix and majority of the colon are unremarkable. Fair amount of stool is seen in the descending and rectosigmoid colon. Vascular/Lymphatic: Atherosclerotic calcification of the aorta. No pathologically enlarged lymph nodes. Reproductive: Obscured by streak artifact  from a right hip arthroplasty. Other: No free fluid. Fluid density structure in the right paracolic gutter measures 4.3 x 6.5 cm (3/51), slightly enlarged from 3.1 x 4.6 cm on 10/19/2019. Mesenteries and peritoneum are otherwise unremarkable. Musculoskeletal: Right hip arthroplasty. Degenerative changes in the spine. Grade 1 anterolisthesis of L4 on L5. L3 inferior endplate compression fracture is old. IMPRESSION: 1. No acute findings. 2. Large hiatal hernia. 3. Fluid density structure in the right paracolic gutter has enlarged in the interval and may represent a lymphangioma or GI duplication cyst. 4.  Aortic atherosclerosis (ICD10-I70.0). Electronically Signed   By: Lorin Picket M.D.   On: 02/08/2021 15:39

## 2021-02-09 NOTE — Progress Notes (Signed)
   Progress Note   Subjective   Doing well today, the patient denies CP or SOB.  No new concerns  Inpatient Medications    Scheduled Meds:  sodium chloride   Intravenous Once   pantoprazole (PROTONIX) IV  40 mg Intravenous Q12H   Continuous Infusions:  lactated ringers     PRN Meds: acetaminophen **OR** acetaminophen, prochlorperazine   Vital Signs    Vitals:   02/09/21 0608 02/09/21 0641 02/09/21 0739 02/09/21 0926  BP: (!) 88/48 (!) 88/51 (!) 88/53 (!) 95/51  Pulse: 63  62 61  Resp:  18 19 20   Temp: 97.9 F (36.6 C) 97.9 F (36.6 C) 98.4 F (36.9 C) 98.5 F (36.9 C)  TempSrc: Oral Oral Oral Oral  SpO2: 99%  99% 99%  Weight:      Height:        Intake/Output Summary (Last 24 hours) at 02/09/2021 1014 Last data filed at 02/09/2021 6269 Gross per 24 hour  Intake 1313.67 ml  Output 350 ml  Net 963.67 ml   Filed Weights   02/08/21 1206  Weight: 61.2 kg    Telemetry    Sinus, NSVT noted - Personally Reviewed  Physical Exam   GEN- The patient is elderly appearing, alert and oriented x 3 today.   Head- normocephalic, atraumatic Eyes-  Sclera clear, conjunctiva pink Ears- hearing intact Oropharynx- clear Neck- supple, Lungs-  normal work of breathing Heart- Regular rate and rhythm  GI- soft  Extremities- no clubbing, cyanosis, or edema    Labs    Chemistry Recent Labs  Lab 02/08/21 1218 02/09/21 0204  NA 141 140  K 3.4* 3.7  CL 101 110  CO2 30 23  GLUCOSE 150* 95  BUN 22 39*  CREATININE 1.64* 1.31*  CALCIUM 9.0 8.1*  PROT 7.0  --   ALBUMIN 3.6  --   AST 25  --   ALT 12  --   ALKPHOS 93  --   BILITOT 0.6  --   GFRNONAA 39* 52*  ANIONGAP 10 7     Hematology Recent Labs  Lab 02/08/21 1218 02/08/21 2117 02/08/21 2309  WBC 8.5 9.4 9.7  RBC 3.20* 2.57* 2.50*  HGB 9.9* 8.1* 7.9*  HCT 32.4* 26.0* 24.9*  MCV 101.3* 101.2* 99.6  MCH 30.9 31.5 31.6  MCHC 30.6 31.2 31.7  RDW 17.9* 17.7* 18.0*  PLT 279 224 204     Patient ID   Jon Reid is a 87 y.o. male with a PMHx of  history of CAD s/p CABG x 4 in2014, HTN, HLD, moderate AS, stroke, CKD III, prostate cancer, and bifascicular block and hx of GIB and esophagitis and history of COVID who is being seen 02/09/2021 for the evaluation of elevated troponins in the setting of GIB at the request of Dr. Olevia Bowens.   Assessment & Plan    1.  CAD s/p CABG No ischemic symptoms No ekgs changes Elevated troponin is likely demand ischemia in the setting of GI bleeding with hypotension and acute renal failure. Not a candidate for anticoagulation with ASA or heparin Would not advise cath currently Echo is pending Given advanced age and comorbidities, a conservative approach is advised with efforts directed at correcting hypotension and anemia.   2. UGI bleeding/ hypotension Per primary team  Thompson Grayer MD, [facility] 02/09/2021 10:14 AM

## 2021-02-09 NOTE — Progress Notes (Addendum)
Troponin 3.82.  Dr. Olevia Bowens notified and an order for EKG received.  Dr. Olevia Bowens rounded on patient at bedside.   Will continue to monitor.

## 2021-02-09 NOTE — Consult Note (Signed)
Cardiology Consultation:   Patient ID: Jon Reid MRN: [id number]; DOB: 04-01-1928  Admit date: 02/08/2021 Date of Consult: 02/09/2021  PCP:  Jon Fraise, MD   [facility] Providers Cardiologist:  Jon Breeding, MD        Patient Profile:   Jon Reid is a 87 y.o. male with a PMHx of  history of CAD s/p CABG x 4 in2014, HTN, HLD, moderate AS, stroke, CKD III, prostate cancer, and bifascicular block and hx of GIB and esophagitis and history of COVID who is being seen 02/09/2021 for the evaluation of elevated troponins in the setting of GIB at the request of Dr. Olevia Reid.   History of Present Illness:   Jon Reid lives locally with his wife and remains relatively physical active. He was admitted for upper GI bleeding work-up as patient reports one night of hematemesis and vomiting. Cardiology is consulted for chest pain and elevated troponin. Patient is seen at bedside, on room air, no acute distress. He told me he had several very brief episodes of chest tightness while lying flat since last night. The chest tightness lasted for a few minutes and usually was triggered by his nauseous and vomiting. He says this type of chest pain appears to be similar to the pain he had prior to his CABG in 2014. Reports Stable weights. Denies fever, chills, syncope, cough, heart palpitations, dyspnea, orthopnea, PND,  dysuria, diarrhea, or worsening pedal edema.  Hgb 9.9->8.1->7.9, troponin 3825->7183, Cr 1.64 (at baseline), K 3.4, Mg 2   Past Medical History:  Diagnosis Date   Arthritis    Carcinoma of prostate ([place])    prostate   CKD (chronic kidney disease), stage III (HCC)    Coronary artery disease    a. 10/2012 Cath: LM 20-30d, lAD 80p, 18m, 80-90d, LCX 90-56m, OM1 90, RCA 50p, 2m, 50d, RPDA 90, EF nl; b. 10/2012 CABG x 4 (LIMA-LAD, SVG-OM1, SVG-PDA->RPL); c. 07/2015 MV: Basal and proximal septal infarct w/o ischemia. EF 57%-->low risk.   First degree AV block 01/28/2019   Noted on  EKG   GERD (gastroesophageal reflux disease)    Gout    HTN (hypertension)    Macrocytosis without anemia 01/08/2012   Moderate aortic stenosis    a. 10/2012 Echo: EF 55-60%, no rwma, BAE, mild AI, mild TR; b. 06/2020 Echo: EF 50-55%, no rwma, mild LVH. Mod dil LA. Mild MR. Mod AS.   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   Sleep apnea    Questionable   Spinal stenosis    Stroke [facility])     Past Surgical History:  Procedure Laterality Date   BALLOON DILATION N/A 10/03/2020   Procedure: BALLOON DILATION;  Surgeon: [name], DO;  [facility];  Service: Endoscopy;  Laterality: N/A;   CARDIAC CATHETERIZATION  11/07/2012   Dr Acie Fredrickson   CATARACT EXTRACTION W/ INTRAOCULAR LENS IMPLANT Bilateral    CORONARY ARTERY BYPASS GRAFT N/A 11/11/2012   Procedure: CORONARY ARTERY BYPASS GRAFTING times four using Right Greater Saphenous Vein Graft harvested endoscopically and Left Internal Mammary Artery.;  Surgeon: Ivin Poot, MD;  Location: [place];  Service: Open Heart Surgery;  Laterality: N/A;   ESOPHAGEAL BRUSHING  10/03/2020   Procedure: ESOPHAGEAL BRUSHING;  Surgeon: [name], DO;  [facility];  Service: Endoscopy;;   ESOPHAGOGASTRODUODENOSCOPY (EGD) WITH PROPOFOL N/A 06/27/2020   Surgeon: Hurshel Keys  K, DO;  large hiatal hernia, LA grade D esophagitis with no bleeding, normal examined duodenum.    ESOPHAGOGASTRODUODENOSCOPY (EGD) WITH PROPOFOL N/A 10/03/2020   Procedure: ESOPHAGOGASTRODUODENOSCOPY (EGD) WITH PROPOFOL;  Surgeon: [name], DO;  [facility];  Service: Endoscopy;  Laterality: N/A;  2:00pm   HIP ARTHROPLASTY Right 11/13/2020   Procedure: ARTHROPLASTY BIPOLAR HIP (HEMIARTHROPLASTY);  Surgeon: Renette Butters, MD;  Location: [place];  Service: Orthopedics;  Laterality: Right;   INGUINAL HERNIA REPAIR Right 01/10/2017   Procedure: OPEN RIGHT HERNIA  REPAIR INGUINAL;  Surgeon: Ileana Roup, MD;  Location: [facility];  Service: General;  Laterality: Right;   INSERTION OF MESH Right 01/10/2017   Procedure: INSERTION OF MESH;  Surgeon: Ileana Roup, MD;  Location: [facility];  Service: General;  Laterality: Right;   INTRAOPERATIVE TRANSESOPHAGEAL ECHOCARDIOGRAM N/A 11/11/2012   Procedure: INTRAOPERATIVE TRANSESOPHAGEAL ECHOCARDIOGRAM;  Surgeon: Ivin Poot, MD;  Location: [place];  Service: Open Heart Surgery;  Laterality: N/A;   LEFT HEART CATHETERIZATION WITH CORONARY ANGIOGRAM N/A 11/07/2012   Procedure: LEFT HEART CATHETERIZATION WITH CORONARY ANGIOGRAM;  Surgeon: Thayer Headings, MD;  [facility] C[facility];  Service: Cardiovascular;  Laterality: N/A;   LUMBAR LAMINECTOMY/DECOMPRESSION MICRODISCECTOMY N/A 02/05/2019   Procedure: LUMBAR LAMINECTOMY/DECOMPRESSION L3-L4;  Surgeon: Latanya Maudlin, MD;  Location: [facility];  Service: Orthopedics;  Laterality: N/A;  1min   LYMPH NODE DISSECTION     Bilateral pelvic   RETROPUBIC PROSTATECTOMY     Radical       Inpatient Medications: Scheduled Meds:  pantoprazole (PROTONIX) IV  40 mg Intravenous Q12H   Continuous Infusions:  lactated ringers 75 mL/hr at 02/08/21 1838   PRN Meds: acetaminophen **OR** acetaminophen, prochlorperazine  Allergies:    Allergies  Allergen Reactions   Crestor [Rosuvastatin] Swelling    GYNECOMASTIA    Trazodone And Nefazodone     Hallucinations    Social History:   Social History   Socioeconomic History   Marital status: Married    Spouse name: Jon Reid   Number of children: 3   Years of education: 12   Highest education level: High school graduate  Occupational History   Occupation: Retired  Tobacco Use   Smoking status: Never   Smokeless tobacco: Never  Scientific laboratory technician Use: Never used  Substance and Sexual Activity   Alcohol use: Yes    Comment: Rare   Drug use: No   Sexual activity: Not Currently  Other Topics Concern   Not  on file  Social History Narrative   Lives at home with wife/sons will stay with them occassionally   Married with 3 children   Denies caffeine use    Social Determinants of Radio broadcast assistant Strain: Not on file  Food Insecurity: Not on file  Transportation Needs: Not on file  Physical Activity: Not on file  Stress: Not on file  Social Connections: Not on file  Intimate Partner Violence: Not on file    Family History:    Family History  Problem Relation Age of Onset   Aneurysm Father        Cerebral   Stroke Father    Hypertension Father    Cancer Mother        colon   Parkinson's disease Son      ROS:  Please see the history of present illness.   All other ROS reviewed and negative.     Physical Exam/Data:   Vitals:  02/08/21 1655 02/08/21 1800 02/08/21 1915 02/08/21 2100  BP:  108/78 98/75 97/62   Pulse: 93 92 89 76  Resp: 20 17 19 18   Temp:    98.2 F (36.8 C)  TempSrc:    Oral  SpO2: 100% 94% 100% 97%  Weight:      Height:        Intake/Output Summary (Last 24 hours) at 02/09/2021 0115 Last data filed at 02/08/2021 2015 Gross per 24 hour  Intake 100.83 ml  Output --  Net 100.83 ml   Last 3 Weights 02/08/2021 02/01/2021 01/26/2021  Weight (lbs) 135 lb 147 lb 12.8 oz 148 lb 3.2 oz  Weight (kg) 61.236 kg 67.042 kg 67.223 kg     Body mass index is 21.79 kg/m.  General:  Well nourished, well developed, in no acute distress HEENT: normal Neck: no JVD Vascular: No carotid bruits; Distal pulses 2+ bilaterally Cardiac:  normal S1, S2; RRR; II/VI systolic RUSB murmur Lungs:  clear to auscultation bilaterally, no wheezing, rhonchi or rales  Abd: soft, nontender, no hepatomegaly  Ext: no edema Musculoskeletal:  No deformities, BUE and BLE strength normal and equal Skin: warm and dry  Neuro:  CNs 2-12 intact, no focal abnormalities noted Psych:  Normal affect   EKG:  The EKG was personally reviewed and demonstrates:  sinus rhythm,  RBBB Telemetry:  Telemetry was personally reviewed and demonstrates:  sinus rhythm  Relevant CV Studies:  Laboratory Data:  High Sensitivity Troponin:   Recent Labs  Lab 02/08/21 2117 02/08/21 2309  TROPONINIHS 3,825* 7,183*     Chemistry Recent Labs  Lab 02/08/21 1218 02/08/21 2117  NA 141  --   K 3.4*  --   CL 101  --   CO2 30  --   GLUCOSE 150*  --   BUN 22  --   CREATININE 1.64*  --   CALCIUM 9.0  --   MG  --  2.0  GFRNONAA 39*  --   ANIONGAP 10  --     Recent Labs  Lab 02/08/21 1218  PROT 7.0  ALBUMIN 3.6  AST 25  ALT 12  ALKPHOS 93  BILITOT 0.6   Lipids No results for input(s): CHOL, TRIG, HDL, LABVLDL, LDLCALC, CHOLHDL in the last 168 hours.  Hematology Recent Labs  Lab 02/08/21 1218 02/08/21 2117 02/08/21 2309  WBC 8.5 9.4 9.7  RBC 3.20* 2.57* 2.50*  HGB 9.9* 8.1* 7.9*  HCT 32.4* 26.0* 24.9*  MCV 101.3* 101.2* 99.6  MCH 30.9 31.5 31.6  MCHC 30.6 31.2 31.7  RDW 17.9* 17.7* 18.0*  PLT 279 224 204   Thyroid No results for input(s): TSH, FREET4 in the last 168 hours.  BNPNo results for input(s): BNP, PROBNP in the last 168 hours.  DDimer No results for input(s): DDIMER in the last 168 hours.   Radiology/Studies:  CT ABDOMEN PELVIS W CONTRAST  Result Date: 02/08/2021 CLINICAL DATA:  Nausea and vomiting. EXAM: CT ABDOMEN AND PELVIS WITH CONTRAST TECHNIQUE: Multidetector CT imaging of the abdomen and pelvis was performed using the standard protocol following bolus administration of intravenous contrast. CONTRAST:  3mL OMNIPAQUE IOHEXOL 300 MG/ML  SOLN COMPARISON:  10/19/2019. FINDINGS: Lower chest: Lung bases show volume loss in both lower lobes. Very large hiatal hernia. Atherosclerotic calcification of the aorta and aortic valve. Heart is at the upper limits of normal in size. No pericardial or pleural effusion. Hepatobiliary: Liver and gallbladder are unremarkable. No biliary ductal dilatation. Pancreas: Negative. Spleen: Negative.  Adrenals/Urinary  Tract: Adrenal glands are unremarkable. Probable small right renal sinus cyst. Additional low-attenuation lesions in the kidneys measure up to 2.8 cm off the lower pole right kidney and are likely cysts. Ureters are decompressed. Bladder is largely obscured by streak artifact from a right hip arthroplasty. Stomach/Bowel: Large hiatal hernia. Stomach, small bowel, appendix and majority of the colon are unremarkable. Fair amount of stool is seen in the descending and rectosigmoid colon. Vascular/Lymphatic: Atherosclerotic calcification of the aorta. No pathologically enlarged lymph nodes. Reproductive: Obscured by streak artifact from a right hip arthroplasty. Other: No free fluid. Fluid density structure in the right paracolic gutter measures 4.3 x 6.5 cm (3/51), slightly enlarged from 3.1 x 4.6 cm on 10/19/2019. Mesenteries and peritoneum are otherwise unremarkable. Musculoskeletal: Right hip arthroplasty. Degenerative changes in the spine. Grade 1 anterolisthesis of L4 on L5. L3 inferior endplate compression fracture is old. IMPRESSION: 1. No acute findings. 2. Large hiatal hernia. 3. Fluid density structure in the right paracolic gutter has enlarged in the interval and may represent a lymphangioma or GI duplication cyst. 4.  Aortic atherosclerosis (ICD10-I70.0). Electronically Signed   By: Lorin Picket M.D.   On: 02/08/2021 15:39     Assessment and Plan:  A 87 y.o. male with a PMHx of  history of CAD s/p CABG x 4 in2014, HTN, HLD, moderate AS, stroke, CKD III, prostate cancer, and bifascicular block and hx of GIB and esophagitis and history of COVID who presents with acute on chronic anemia in the setting of GI bleeding. His chest pain appears atypical however concerning as it is reported to be similar to the one he had prior to his CABG. His troponins continue rising and concerning. EKG is not concerned for ACS. Currently he is chest pain free, no acute distress and on room air. Apparently  he is not a candidate for an invasive ischemic workup given acute GI bleed at this point.  -continue Telemetry -trend troponin until peaks -EKG prn -optimize his electrolytes  -acquire a TTE am -No anticoagluation or antiplatelet therapy, when cleared by GI could start aspirin. -Given CV problems, transfuse if Hgb<8 -Spoke with the patient, he does not want any left heart cath and prefer medical therapy. If chest pain occurs, anti-anginal meds PRN, such as SL nitro. Will decide long-term anti-anginal medication pending his hospital course.    Risk Assessment/Risk Scores:     TIMI Risk Score for Unstable Angina or Non-ST Elevation MI:   The patient's TIMI risk score is 6, which indicates a 41% risk of all cause mortality, new or recurrent myocardial infarction or need for urgent revascularization in the next 14 days.    For questions or updates, please contact [place] Please consult [email or web address] for contact info under    Signed, Laurice Record, MD  02/09/2021 1:15 AM

## 2021-02-09 NOTE — Consult Note (Addendum)
Referring Provider: [facility] PCP: Claretta Fraise, MD  Gastroenterologist: [facility] Reason for consultation:    GI bleed               ASSESSMENT / PLAN   #87 year old male  with history of upper GI bleed / large hiatal hernia / severe esophagitis / iron deficiency anemia in April 2022 . Transferred from [facility] yesterday with recurrent nausea / vomiting / coffee-ground emesis and acute on chronic anemia.   He reports darker than normal stool a few days ago (on iron). FOBT negative in ED. rule out bleeding from esophagitis . Cameron's erosions possible given large HH. PUD not excluded.   AVMs in setting of AS another possibility      --Continue BID PPI --Follow up CBC to evaluate response to blood transfusion this a.m. --May require repeat EGD tomorrow. 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 Fluid density structure in the right paracolic gutter has enlarged in the interval and may represent a lymphangioma or GI duplication cyst.  #AKI superimposed on CKD 3.  Suspected prerenal in the setting of vomiting.  Got small bolus of IV fluid and creatinine has improved 1.64 >> 1.31  # Elevated troponin ( 7.183 ). Hx of CAD / remote CABG. Cardiology has evaluated, no evidence for ACS. Not candidate for ischemic workup, at least until GI bleed resolves.  # Hypotension. Not actively bleeding  #Prolonged QT interval, new finding on telemetry.  Electrolytes being replaced   # Additional medical history listed below.   HISTORY OF PRESENT ILLNESS                                                                                                                         Chief Complaint:  nausea / vomiting / dark emesis  Jon Reid is a 87 y.o. male with a past medical history significant for CAD/CABG x 4 in 2014, first-degree AV block , CVA, hypertension, aortic stenosis, COVID-19, CKD 3, prostate cancer, gait disorder, large hiatal hernia, esophagitis . See PMH  for any additional medical problems.   Patient was admitted to [facility] April 2022 for melena and coffee-ground emesis.  His hemoglobin had declined to 13-6, he was iron deficient.  EGD showed a large hiatal hernia, grade D esophagitis with very scant bleeding.  He required 3 units of PRBCs.  Outpatient follow-up EGD July 2022 remarkable for nonsevere Candida esophagitis, gastritis.  KOH showed a few yeast.  Patient was readmitted to the hospital late August for a right femur fracture following a fall.  He underwent right hip hemiarthroplasty.  He had postop anemia, received 2 units of PRBCs.  He was discharged to inpatient rehab..  On 928 his hemoglobin was 10 .  Outpatient hemoglobin 01/05/2021 was 8.6 .  PCP rechecked it on 11/16 and it was 11. He takes oral iron.   ED course:  Patient presented to [facility] ED yesterday for evaluation of  nausea, vomiting with dark emesis.  He was heme-negative.  Hemoglobin was 9.9.    Patient was transferred to [facility].  Hemoglobin has since declined to 7.9.  He has received a unit of blood this morning.  Patient is a limited historian but tells me he began having nausea and vomiting about 3 days ago.  He describes subsequent passage of a large amount of dark stool ( darker than what he normally has on) he denies abdominal pain.  No further nausea or vomiting this morning.  No NSAID use at home.  He reports compliance with twice daily pantoprazole.    IMAGING:  CT ABDOMEN PELVIS W CONTRAST  Result Date: 02/08/2021 CLINICAL DATA:  Nausea and vomiting. EXAM: CT ABDOMEN AND PELVIS WITH CONTRAST TECHNIQUE: Multidetector CT imaging of the abdomen and pelvis was performed using the standard protocol following bolus administration of intravenous contrast. CONTRAST:  83mL OMNIPAQUE IOHEXOL 300 MG/ML  SOLN COMPARISON:  10/19/2019. FINDINGS: Lower chest: Lung bases show volume loss in both lower lobes. Very large hiatal hernia. Atherosclerotic calcification of the aorta and  aortic valve. Heart is at the upper limits of normal in size. No pericardial or pleural effusion. Hepatobiliary: Liver and gallbladder are unremarkable. No biliary ductal dilatation. Pancreas: Negative. Spleen: Negative. Adrenals/Urinary Tract: Adrenal glands are unremarkable. Probable small right renal sinus cyst. Additional low-attenuation lesions in the kidneys measure up to 2.8 cm off the lower pole right kidney and are likely cysts. Ureters are decompressed. Bladder is largely obscured by streak artifact from a right hip arthroplasty. Stomach/Bowel: Large hiatal hernia. Stomach, small bowel, appendix and majority of the colon are unremarkable. Fair amount of stool is seen in the descending and rectosigmoid colon. Vascular/Lymphatic: Atherosclerotic calcification of the aorta. No pathologically enlarged lymph nodes. Reproductive: Obscured by streak artifact from a right hip arthroplasty. Other: No free fluid. Fluid density structure in the right paracolic gutter measures 4.3 x 6.5 cm (3/51), slightly enlarged from 3.1 x 4.6 cm on 10/19/2019. Mesenteries and peritoneum are otherwise unremarkable. Musculoskeletal: Right hip arthroplasty. Degenerative changes in the spine. Grade 1 anterolisthesis of L4 on L5. L3 inferior endplate compression fracture is old. IMPRESSION: 1. No acute findings. 2. Large hiatal hernia. 3. Fluid density structure in the right paracolic gutter has enlarged in the interval and may represent a lymphangioma or GI duplication cyst. 4.  Aortic atherosclerosis (ICD10-I70.0). Electronically Signed   By: Lorin Picket M.D.   On: 02/08/2021 15:39   DG Chest Port 1 View  Result Date: 02/09/2021 CLINICAL DATA:  Shortness of breath EXAM: PORTABLE CHEST 1 VIEW COMPARISON:  And chest x-ray 11/24/2020 FINDINGS: Heart is enlarged. Mediastinum appears stable. Calcified plaques in the aortic arch. Cardiac surgical changes and median sternotomy wires. Large hiatal hernia eccentric to the right again  seen. No new consolidation identified. Opacities at the left costophrenic angle similar to previous study consistent with small effusion and atelectasis. No pneumothorax. IMPRESSION: 1. Cardiomegaly. 2. Small left pleural effusion with associated atelectasis. 3. Large hiatal hernia. Electronically Signed   By: Ofilia Neas M.D.   On: 02/09/2021 09:45     PREVIOUS ENDOSCOPIC EVALUATIONS  / IMAGING STUDIES   EGD April 2022 at [facility] for evaluation of upper GI bleed --Large hiatal hernia, grade D esophagitis  EGD July 2022 [facility]) for follow-up on previous EGD --Candida esophagitis, nonsevere.  Mild gastritis . KOH showed some yeast.  Past Medical History:  Diagnosis Date   Arthritis    Carcinoma of prostate ([place])  prostate   CKD (chronic kidney disease), stage III (HCC)    Coronary artery disease    a. 10/2012 Cath: LM 20-30d, lAD 80p, 47m, 80-90d, LCX 90-44m, OM1 90, RCA 50p, 72m, 50d, RPDA 90, EF nl; b. 10/2012 CABG x 4 (LIMA-LAD, SVG-OM1, SVG-PDA->RPL); c. 07/2015 MV: Basal and proximal septal infarct w/o ischemia. EF 57%-->low risk.   First degree AV block 01/28/2019   Noted on EKG   GERD (gastroesophageal reflux disease)    Gout    HTN (hypertension)    Macrocytosis without anemia 01/08/2012   Moderate aortic stenosis    a. 10/2012 Echo: EF 55-60%, no rwma, BAE, mild AI, mild TR; b. 06/2020 Echo: EF 50-55%, no rwma, mild LVH. Mod dil LA. Mild MR. Mod AS.   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   Sleep apnea    Questionable   Spinal stenosis    Stroke [facility])     Past Surgical History:  Procedure Laterality Date   BALLOON DILATION N/A 10/03/2020   Procedure: BALLOON DILATION;  Surgeon: [name], DO;  [facility];  Service: Endoscopy;  Laterality: N/A;   CARDIAC CATHETERIZATION  11/07/2012   Dr Acie Fredrickson   CATARACT EXTRACTION W/ INTRAOCULAR LENS IMPLANT Bilateral     CORONARY ARTERY BYPASS GRAFT N/A 11/11/2012   Procedure: CORONARY ARTERY BYPASS GRAFTING times four using Right Greater Saphenous Vein Graft harvested endoscopically and Left Internal Mammary Artery.;  Surgeon: Ivin Poot, MD;  Location: [place];  Service: Open Heart Surgery;  Laterality: N/A;   ESOPHAGEAL BRUSHING  10/03/2020   Procedure: ESOPHAGEAL BRUSHING;  Surgeon: [name], DO;  [facility];  Service: Endoscopy;;   ESOPHAGOGASTRODUODENOSCOPY (EGD) WITH PROPOFOL N/A 06/27/2020   Surgeon: [name], DO;  large hiatal hernia, LA grade D esophagitis with no bleeding, normal examined duodenum.    ESOPHAGOGASTRODUODENOSCOPY (EGD) WITH PROPOFOL N/A 10/03/2020   Procedure: ESOPHAGOGASTRODUODENOSCOPY (EGD) WITH PROPOFOL;  Surgeon: [name], DO;  [facility];  Service: Endoscopy;  Laterality: N/A;  2:00pm   HIP ARTHROPLASTY Right 11/13/2020   Procedure: ARTHROPLASTY BIPOLAR HIP (HEMIARTHROPLASTY);  Surgeon: Renette Butters, MD;  Location: [place];  Service: Orthopedics;  Laterality: Right;   INGUINAL HERNIA REPAIR Right 01/10/2017   Procedure: OPEN RIGHT HERNIA REPAIR INGUINAL;  Surgeon: Ileana Roup, MD;  Location: [facility];  Service: General;  Laterality: Right;   INSERTION OF MESH Right 01/10/2017   Procedure: INSERTION OF MESH;  Surgeon: Ileana Roup, MD;  Location: [facility];  Service: General;  Laterality: Right;   INTRAOPERATIVE TRANSESOPHAGEAL ECHOCARDIOGRAM N/A 11/11/2012   Procedure: INTRAOPERATIVE TRANSESOPHAGEAL ECHOCARDIOGRAM;  Surgeon: Ivin Poot, MD;  Location: [place];  Service: Open Heart Surgery;  Laterality: N/A;   LEFT HEART CATHETERIZATION WITH CORONARY ANGIOGRAM N/A 11/07/2012   Procedure: LEFT HEART CATHETERIZATION WITH CORONARY ANGIOGRAM;  Surgeon: Thayer Headings, MD;  Location: [facility] C[facility];  Service: Cardiovascular;  Laterality: N/A;   LUMBAR LAMINECTOMY/DECOMPRESSION MICRODISCECTOMY N/A 02/05/2019   Procedure:  LUMBAR LAMINECTOMY/DECOMPRESSION L3-L4;  Surgeon: Latanya Maudlin, MD;  Location: [facility];  Service: Orthopedics;  Laterality: N/A;  3min   LYMPH NODE DISSECTION     Bilateral pelvic   RETROPUBIC PROSTATECTOMY     Radical    Prior to Admission medications   Medication Sig Start Date End Date Taking? Authorizing Provider  ascorbic acid (VITAMIN C)  500 MG tablet Take 1 tablet (500 mg total) by mouth daily. 06/30/20  Yes Barton Dubois, MD  aspirin 81 MG EC tablet Take 1 tablet (81 mg total) by mouth daily. Swallow whole. 12/08/20  Yes Love, Ivan Anchors, PA-C  carbidopa-levodopa (SINEMET IR) 25-100 MG tablet Take 1.5 tablets by mouth 3 (three) times daily. Patient taking differently: Take 1.5 tablets by mouth 2 (two) times daily. 09/21/20  Yes Kathrynn Ducking, MD  Cholecalciferol (VITAMIN D) 2000 units CAPS Take 2,000 Units by mouth daily.   Yes [provider]  docusate sodium (COLACE) 100 MG capsule Take 1 capsule (100 mg total) by mouth 2 (two) times daily. 12/07/20  Yes Love, Ivan Anchors, PA-C  fluticasone (FLONASE) 50 MCG/ACT nasal spray One to 2 sprays each nostril at bedtime Patient taking differently: Place 2 sprays into both nostrils daily as needed for allergies. 10/12/14  Yes Chipper Herb, MD  guaiFENesin (MUCINEX) 600 MG 12 hr tablet Take 600 mg by mouth 2 (two) times daily as needed for cough.   Yes [provider]  Iron, Ferrous Sulfate, 325 (65 Fe) MG TABS Take 1 tablet by mouth daily. Patient taking differently: Take 325 mg by mouth daily. 12/14/20  Yes Stacks, [name], MD  melatonin 5 MG TABS Take 1 tablet (5 mg total) by mouth at bedtime. 12/07/20  Yes Love, Ivan Anchors, PA-C  Multiple Vitamins-Minerals (CERTAVITE/ANTIOXIDANTS) TABS Take 1 tablet by mouth daily. 12/08/20  Yes Love, Ivan Anchors, PA-C  nitroGLYCERIN (NITROSTAT) 0.4 MG SL tablet Place 1 tablet (0.4 mg total) under the tongue every 5 (five) minutes as needed for chest pain. 07/20/15  Yes Kilroy, Luke K, PA-C   potassium chloride SA (KLOR-CON) 10 MEQ tablet Take 2 tablets (20 mEq total) by mouth daily. For potassium replacement/ supplement 01/18/21 04/18/21 Yes Stacks, [name], MD  senna-docusate (SENOKOT-S) 8.6-50 MG tablet Take 2 tablets by mouth daily with supper. 12/08/20  Yes Love, Ivan Anchors, PA-C  triamcinolone cream (KENALOG) 0.1 % Apply 1 application topically daily as needed (itching). 06/29/20  Yes Barton Dubois, MD  Dextromethorphan-guaiFENesin 10-100 MG/5ML liquid Take 5 mLs by mouth every 6 (six) hours. Patient not taking: Reported on 02/09/2021 12/07/20   Love, Ivan Anchors, PA-C  furosemide (LASIX) 40 MG tablet Take 1 tablet (40 mg total) by mouth 2 (two) times daily. One at breakfast. The other with lunch Patient taking differently: Take 40 mg by mouth daily. One at breakfast. 01/18/21   Claretta Fraise, MD  pantoprazole (PROTONIX) 40 MG tablet Take 1 tablet (40 mg total) by mouth 2 (two) times daily. 12/07/20   Bary Leriche, PA-C    Current Facility-Administered Medications  Medication Dose Route Frequency Provider Last Rate Last Admin   0.9 %  sodium chloride infusion (Manually program via Guardrails IV Fluids)   Intravenous Once Reubin Milan, MD       acetaminophen (TYLENOL) tablet 650 mg  650 mg Oral Q6H PRN Orma Flaming, MD       Or   acetaminophen (TYLENOL) suppository 650 mg  650 mg Rectal Q6H PRN Orma Flaming, MD       lactated ringers infusion   Intravenous Continuous Thurnell Lose, MD       pantoprazole (PROTONIX) injection 40 mg  40 mg Intravenous Q12H Orma Flaming, MD   40 mg at 02/08/21 2233   prochlorperazine (COMPAZINE) injection 10 mg  10 mg Intravenous Q6H PRN Orma Flaming, MD        Allergies as  of 02/08/2021 - Review Complete 02/08/2021  Allergen Reaction Noted   Crestor [rosuvastatin] Swelling 09/03/2013   Trazodone and nefazodone  12/07/2020    Family History  Problem Relation Age of Onset   Aneurysm Father        Cerebral   Stroke Father     Hypertension Father    Cancer Mother        colon   Parkinson's disease Son     Social History   Socioeconomic History   Marital status: Married    Spouse name: [name]   Number of children: 3   Years of education: 12   Highest education level: High school graduate  Occupational History   Occupation: Retired  Tobacco Use   Smoking status: Never   Smokeless tobacco: Never  Scientific laboratory technician Use: Never used  Substance and Sexual Activity   Alcohol use: Yes    Comment: Rare   Drug use: No   Sexual activity: Not Currently  Other Topics Concern   Not on file  Social History Narrative   Lives at home with wife/sons will stay with them occassionally   Married with 3 children   Denies caffeine use    Social Determinants of Radio broadcast assistant Strain: Not on file  Food Insecurity: Not on file  Transportation Needs: Not on file  Physical Activity: Not on file  Stress: Not on file  Social Connections: Not on file  Intimate Partner Violence: Not on file    Review of Systems: All systems reviewed and negative except where noted in HPI.   OBJECTIVE    Physical Exam: Vital signs in last 24 hours: Temp:  [97.7 F (36.5 C)-98.5 F (36.9 C)] 98.5 F (36.9 C) (11/24 0926) Pulse Rate:  [61-100] 61 (11/24 0926) Resp:  [15-23] 20 (11/24 0926) BP: (88-129)/(48-78) 95/51 (11/24 0926) SpO2:  [94 %-100 %] 99 % (11/24 0926) Weight:  [61.2 kg] 61.2 kg (11/23 1206) Last BM Date: 02/08/21  General:  Alert male in NAD Psych:  Pleasant, cooperative. Normal mood and affect Eyes: Pupils equal, no icterus. Conjunctive pink Ears:  Normal auditory acuity Nose: No deformity, discharge or lesions Neck:  Supple, no masses felt Lungs:  Clear to auscultation.  Heart:  Regular rate, regular rhythm, + murmur.  No lower extremity edema Abdomen:  Soft, nondistended, nontender, active bowel sounds, no masses felt Rectal :  Deferred Msk: Symmetrical without gross deformities.   Neurologic:  Alert, oriented, grossly normal neurologically Skin:  Intact without significant lesions.    Scheduled inpatient medications  sodium chloride   Intravenous Once   pantoprazole (PROTONIX) IV  40 mg Intravenous Q12H      Intake/Output from previous day: 11/23 0701 - 11/24 0700 In: 969.9 [I.V.:837.8; Blood:31.3; IV Piggyback:100.8] Out: -  Intake/Output this shift: Total I/O In: 343.8 [Blood:343.8] Out: 350 [Urine:350]   Lab Results: Recent Labs    02/08/21 1218 02/08/21 2117 02/08/21 2309  WBC 8.5 9.4 9.7  HGB 9.9* 8.1* 7.9*  HCT 32.4* 26.0* 24.9*  PLT 279 224 204   BMET Recent Labs    02/08/21 1218 02/09/21 0204  NA 141 140  K 3.4* 3.7  CL 101 110  CO2 30 23  GLUCOSE 150* 95  BUN 22 39*  CREATININE 1.64* 1.31*  CALCIUM 9.0 8.1*   LFTs Recent Labs    02/08/21 1218  PROT 7.0  ALBUMIN 3.6  AST 25  ALT 12  ALKPHOS 93  BILITOT 0.6  PT/INR No results for input(s): LABPROT, INR in the last 72 hours. Hepatitis Panel No results for input(s): HEPBSAG, HCVAB, HEPAIGM, HEPBIGM in the last 72 hours.   . CBC Latest Ref Rng & Units 02/08/2021 02/08/2021 02/08/2021  WBC 4.0 - 10.5 K/uL 9.7 9.4 8.5  Hemoglobin 13.0 - 17.0 g/dL 7.9(L) 8.1(L) 9.9(L)  Hematocrit 39.0 - 52.0 % 24.9(L) 26.0(L) 32.4(L)  Platelets 150 - 400 K/uL 204 224 279    . CMP Latest Ref Rng & Units 02/09/2021 02/08/2021 02/01/2021  Glucose 70 - 99 mg/dL 95 150(H) 100(H)  BUN 8 - 23 mg/dL 39(H) 22 26  Creatinine 0.61 - 1.24 mg/dL 1.31(H) 1.64(H) 1.53(H)  Sodium 135 - 145 mmol/L 140 141 147(H)  Potassium 3.5 - 5.1 mmol/L 3.7 3.4(L) 4.5  Chloride 98 - 111 mmol/L 110 101 103  CO2 22 - 32 mmol/L 23 30 24   Calcium 8.9 - 10.3 mg/dL 8.1(L) 9.0 9.1  Total Protein 6.5 - 8.1 g/dL - 7.0 7.2  Total Bilirubin 0.3 - 1.2 mg/dL - 0.6 0.4  Alkaline Phos 38 - 126 U/L - 93 123(H)  AST 15 - 41 U/L - 25 28  ALT 0 - 44 U/L - 12 13     Principal Problem:   Upper GI bleed Active  Problems:   Benign essential hypertension   Hyperlipemia   Coronary artery disease involving native coronary artery of native heart without angina pectoris   Gastroesophageal reflux disease with esophagitis   Hypokalemia   Acute renal failure superimposed on stage 3 chronic kidney disease (HCC)   Prolonged QT interval    Tye Savoy, NP-C @  02/09/2021, 9:50 AM

## 2021-02-09 NOTE — Progress Notes (Signed)
At [place] patient had a 6 run beat of V-tach. At [place] he had another 7 beat of V-tach.  Dr. Olevia Bowens text paged.  Will continue to monitor.

## 2021-02-09 NOTE — Progress Notes (Signed)
[facility] night shift PCU coverage note.  The nursing staff reported that the patient's blood pressure was 86/53 mmHg with a MAP of 64.  His most recent H&H was 7.9 g/dL.  Cardiology recommended to transfuse if it fell below 8.0 mg/dL.  A 1 unit PRBC transfusion ordered.  Tennis Must, MD.

## 2021-02-09 NOTE — Progress Notes (Signed)
Lab reported critical hgb of 7.9.  BP of 86/53 with a map of 64.  Dr. Olevia Bowens notified and an order to transfuse one unit PRBC received.  Will continue to monitor.

## 2021-02-10 ENCOUNTER — Ambulatory Visit: Payer: 59 | Admitting: Gastroenterology

## 2021-02-10 DIAGNOSIS — I10 Essential (primary) hypertension: Secondary | ICD-10-CM

## 2021-02-10 DIAGNOSIS — K922 Gastrointestinal hemorrhage, unspecified: Secondary | ICD-10-CM | POA: Diagnosis not present

## 2021-02-10 DIAGNOSIS — I2510 Atherosclerotic heart disease of native coronary artery without angina pectoris: Secondary | ICD-10-CM | POA: Diagnosis not present

## 2021-02-10 LAB — COMPREHENSIVE METABOLIC PANEL
ALT: 26 U/L (ref 0–44)
AST: 94 U/L — ABNORMAL HIGH (ref 15–41)
Albumin: 2.4 g/dL — ABNORMAL LOW (ref 3.5–5.0)
Alkaline Phosphatase: 63 U/L (ref 38–126)
Anion gap: 5 (ref 5–15)
BUN: 28 mg/dL — ABNORMAL HIGH (ref 8–23)
CO2: 25 mmol/L (ref 22–32)
Calcium: 8.1 mg/dL — ABNORMAL LOW (ref 8.9–10.3)
Chloride: 109 mmol/L (ref 98–111)
Creatinine, Ser: 1.34 mg/dL — ABNORMAL HIGH (ref 0.61–1.24)
GFR, Estimated: 50 mL/min — ABNORMAL LOW (ref >60)
Glucose, Bld: 101 mg/dL — ABNORMAL HIGH (ref 70–99)
Potassium: 3.4 mmol/L — ABNORMAL LOW (ref 3.5–5.1)
Sodium: 139 mmol/L (ref 135–145)
Total Bilirubin: 0.8 mg/dL (ref 0.3–1.2)
Total Protein: 4.7 g/dL — ABNORMAL LOW (ref 6.5–8.1)

## 2021-02-10 LAB — TYPE AND SCREEN
ABO/RH(D): O POS
Antibody Screen: NEGATIVE
Unit division: 0

## 2021-02-10 LAB — CBC WITH DIFFERENTIAL/PLATELET
Abs Immature Granulocytes: 0.02 10*3/uL (ref 0.00–0.07)
Basophils Absolute: 0.1 10*3/uL (ref 0.0–0.1)
Basophils Relative: 1 %
Eosinophils Absolute: 0.1 10*3/uL (ref 0.0–0.5)
Eosinophils Relative: 1 %
HCT: 25.3 % — ABNORMAL LOW (ref 39.0–52.0)
Hemoglobin: 8.2 g/dL — ABNORMAL LOW (ref 13.0–17.0)
Immature Granulocytes: 0 %
Lymphocytes Relative: 25 %
Lymphs Abs: 1.8 10*3/uL (ref 0.7–4.0)
MCH: 31.5 pg (ref 26.0–34.0)
MCHC: 32.4 g/dL (ref 30.0–36.0)
MCV: 97.3 fL (ref 80.0–100.0)
Monocytes Absolute: 0.7 10*3/uL (ref 0.1–1.0)
Monocytes Relative: 9 %
Neutro Abs: 4.7 10*3/uL (ref 1.7–7.7)
Neutrophils Relative %: 64 %
Platelets: 206 10*3/uL (ref 150–400)
RBC: 2.6 MIL/uL — ABNORMAL LOW (ref 4.22–5.81)
RDW: 18.3 % — ABNORMAL HIGH (ref 11.5–15.5)
WBC: 7.3 10*3/uL (ref 4.0–10.5)
nRBC: 0 % (ref 0.0–0.2)

## 2021-02-10 LAB — BPAM RBC
Blood Product Expiration Date: 202212212359
ISSUE DATE / TIME: 202211240620
Unit Type and Rh: 5100

## 2021-02-10 LAB — TROPONIN I (HIGH SENSITIVITY): Troponin I (High Sensitivity): 7446 ng/L (ref <18)

## 2021-02-10 LAB — MAGNESIUM: Magnesium: 1.9 mg/dL (ref 1.7–2.4)

## 2021-02-10 MED ORDER — POTASSIUM CHLORIDE CRYS ER 20 MEQ PO TBCR
40.0000 meq | EXTENDED_RELEASE_TABLET | Freq: Once | ORAL | Status: AC
  Administered 2021-02-10: 40 meq via ORAL
  Filled 2021-02-10: qty 2

## 2021-02-10 MED ORDER — POLYETHYLENE GLYCOL 3350 17 G PO PACK
17.0000 g | PACK | Freq: Every day | ORAL | Status: DC
  Administered 2021-02-10 – 2021-02-11 (×2): 17 g via ORAL
  Filled 2021-02-10 (×2): qty 1

## 2021-02-10 MED ORDER — ENSURE ENLIVE PO LIQD
237.0000 mL | Freq: Two times a day (BID) | ORAL | Status: DC
  Administered 2021-02-10 – 2021-02-11 (×2): 237 mL via ORAL

## 2021-02-10 MED ORDER — FUROSEMIDE 10 MG/ML IJ SOLN
40.0000 mg | Freq: Once | INTRAMUSCULAR | Status: AC
  Administered 2021-02-10: 40 mg via INTRAVENOUS
  Filled 2021-02-10: qty 4

## 2021-02-10 MED ORDER — TOBRAMYCIN 0.3 % OP OINT
TOPICAL_OINTMENT | Freq: Three times a day (TID) | OPHTHALMIC | Status: DC
  Filled 2021-02-10: qty 3.5

## 2021-02-10 MED ORDER — FUROSEMIDE 10 MG/ML IJ SOLN
40.0000 mg | Freq: Every day | INTRAMUSCULAR | Status: DC
  Administered 2021-02-11: 40 mg via INTRAVENOUS
  Filled 2021-02-10: qty 4

## 2021-02-10 NOTE — Progress Notes (Signed)
PROGRESS NOTE                                                                                                                                                                                                             Patient Demographics:    Jon Reid, is a 87 y.o. male, DOB - 07/15/1928, HLK:[id number]  Outpatient Primary MD for the patient is Claretta Fraise, MD    LOS - 1  Admit date - 02/08/2021    No chief complaint on file.      Brief Narrative (HPI from H&P)  Jon Reid is a 87 y.o. male with medical history significant of CKD stage III, CAD s/p CABGx4, hx of CVA, hx of first degree heart block, GERD, HTN, hx of prostate cancer.  He also has a history of upper GI bleeds and acute blood loss anemia due to LA grade D esophagitis-last EGD in 09/2020 who presented  to [facility] with one night of vomiting, black colored vomit with some dark stools.  Came to the ER where his Hemoccult stools interestingly were negative, BUN was unremarkable, he was found to have mild AKI, he never had chest pain but a troponin was checked in the ER which was high he was admitted for further work-up.   Subjective:   Patient in bed, appears comfortable, denies any headache, no fever, no chest pain or pressure, no shortness of breath , no abdominal pain. No new focal weakness.    Assessment  & Plan :     Nausea vomiting with dark vomit and stool - he has history of esophagitis with previous upper GI bleed, currently BUN is stable, H&H has dropped mildly when accounted for heme dilution, he is already getting 1 unit of packed RBC this morning, GI on board on IV PPI we will continue to monitor.  Discussed with GI on 02/10/2021, continue to monitor clinically advance diet.  2.  AKI on CKD 3A.  Baseline creatinine 1.2, AKI due to nausea vomiting dehydration, continue hydrating with IV fluids transfusion being given on 02/09/2021 Will monitor.  3.   Acute on chronic anemia, he has chronic anemia some anemia can be explained by him dilution and there could be some upper GI blood loss as well.  See #1 above.  4.  CAD s/p CABG -he is chest pain-free, EKG  has nonspecific changes but likely chronic, troponin noted echo pending, poor candidate for aspirin or Plavix due to upper GI bleed.  Blood pressure too low for beta-blocker, has statin allergy hence no statin, will monitor with supportive care cardiology on board.  Poor candidate for invasive testing or procedures.  5.  Hypotension.  Monitor closely with midodrine if needed along with IV fluid boluses.  6.  Hypokalemia.  Replaced.  7. Chr. L conjunctival irritation - eye ointment and monitor.       Condition - Extremely Guarded  Family Communication  : Called son Jeani Hawking [phone] 02/09/2021 at 7:54 AM and message left, another message left at 9:57 AM, updated wife 02/07/21 - hosp room.  Called son [name] [phone] 02/10/21   Code Status :  Full  Consults  :  GI, Cards  PUD Prophylaxis : PPI   Procedures  :     CT - 1. No acute findings. 2. Large hiatal hernia. 3. Fluid density structure in the right paracolic gutter has enlarged in the interval and may represent a lymphangioma or GI duplication cyst. 4.  Aortic atherosclerosis  TTE - 1. Left ventricular ejection fraction, by estimation, is 55 to 60%. The left ventricle has normal function. The left ventricle has no regional wall motion abnormalities. Left ventricular diastolic parameters are consistent with Grade II diastolic dysfunction (pseudonormalization).  2. Right ventricular systolic function is normal. The right ventricular size is normal.  3. Left atrial size was severely dilated.  4. The mitral valve is degenerative. Mild to moderate mitral valve regurgitation. No evidence of mitral stenosis.  5. The aortic valve is tricuspid. There is moderate calcification of the aortic valve. There is moderate thickening of the aortic  valve. Aortic valve regurgitation is trivial. Moderate aortic valve stenosis. Aortic valve area, by VTI measures 1.05 cm. Aortic valve mean gradient measures 23.0 mmHg. Aortic valve Vmax measures 3.17 m/s. Comparison(s): No significant change from prior study. Distal septal hypokinesis commented on the prior study. Appears to be postop septum in setting of CABG. AS remains moderate.      Disposition Plan  :    Status is: Inp  DVT Prophylaxis  :    Place and maintain sequential compression device Start: 02/09/21 0754 Place TED hose Start: 02/08/21 1739    Lab Results  Component Value Date   PLT 206 02/10/2021    Diet :  Diet Order             Diet clear liquid Room service appropriate? Yes; Fluid consistency: Thin  Diet effective now                    Inpatient Medications  Scheduled Meds:  sodium chloride   Intravenous Once   pantoprazole (PROTONIX) IV  40 mg Intravenous Q12H   polyethylene glycol  17 g Oral Daily   potassium chloride  40 mEq Oral Once   tobramycin   Left Eye TID   Continuous Infusions:   PRN Meds:.acetaminophen **OR** acetaminophen, prochlorperazine  Antibiotics  :    Anti-infectives (From admission, onward)    None        Time Spent in minutes  30   Lala Lund M.D on 02/10/2021 at 10:27 AM  To page go to [email or web address]   [facility] -  Office  [phone]  See all Orders from today for further details    Objective:   Vitals:   02/10/21 0400 02/10/21 0730 02/10/21 0734 02/10/21 0813  BP: Marland Kitchen)  101/48 (!) 97/43  (!) 102/58  Pulse: (!) 53 (!) 53 (!) 56 62  Resp: 17 17 17 18   Temp: 97.7 F (36.5 C) 97.7 F (36.5 C)    TempSrc: Axillary Axillary    SpO2: 97% 95%  96%  Weight:      Height:        Wt Readings from Last 3 Encounters:  02/08/21 61.2 kg  02/01/21 67 kg  01/26/21 67.2 kg     Intake/Output Summary (Last 24 hours) at 02/10/2021 1027 Last data filed at 02/10/2021 0654 Gross per 24 hour   Intake 1003.53 ml  Output 850 ml  Net 153.53 ml     Physical Exam  Awake Alert, No new F.N deficits, mild L lower conjunctival redness [place].AT,PERRAL Supple Neck, No JVD,   Symmetrical Chest wall movement, Good air movement bilaterally, CTAB RRR,No Gallops, Rubs or new Murmurs,  +ve B.Sounds, Abd Soft, No tenderness,   No Cyanosis, Clubbing or edema        Data Review:    CBC Recent Labs  Lab 02/08/21 2117 02/08/21 2309 02/09/21 1108 02/09/21 1948 02/10/21 0123  WBC 9.4 9.7 7.4 8.5 7.3  HGB 8.1* 7.9* 8.4* 8.7* 8.2*  HCT 26.0* 24.9* 26.5* 26.6* 25.3*  PLT 224 204 214 213 206  MCV 101.2* 99.6 96.7 96.4 97.3  MCH 31.5 31.6 30.7 31.5 31.5  MCHC 31.2 31.7 31.7 32.7 32.4  RDW 17.7* 18.0* 18.1* 18.4* 18.3*  LYMPHSABS  --   --   --  2.3 1.8  MONOABS  --   --   --  0.7 0.7  EOSABS  --   --   --  0.1 0.1  BASOSABS  --   --   --  0.1 0.1    Electrolytes Recent Labs  Lab 02/08/21 1218 02/08/21 2117 02/09/21 0204 02/09/21 1108 02/10/21 0123  NA 141  --  140 140 139  K 3.4*  --  3.7 3.6 3.4*  CL 101  --  110 111 109  CO2 30  --  23 24 25   GLUCOSE 150*  --  95 132* 101*  BUN 22  --  39* 34* 28*  CREATININE 1.64*  --  1.31* 1.34* 1.34*  CALCIUM 9.0  --  8.1* 8.3* 8.1*  AST 25  --   --   --  94*  ALT 12  --   --   --  26  ALKPHOS 93  --   --   --  63  BILITOT 0.6  --   --   --  0.8  ALBUMIN 3.6  --   --   --  2.4*  MG  --  2.0  --  2.0 1.9  BNP  --   --   --  1,423.4*  --     ------------------------------------------------------------------------------------------------------------------ No results for input(s): CHOL, HDL, LDLCALC, TRIG, CHOLHDL, LDLDIRECT in the last 72 hours.  Lab Results  Component Value Date   HGBA1C 5.5% 05/31/2014    No results for input(s): TSH, T4TOTAL, T3FREE, THYROIDAB in the last 72 hours.  Invalid input(s):  FREET3 ------------------------------------------------------------------------------------------------------------------ ID Labs Recent Labs  Lab 02/08/21 1218 02/08/21 2117 02/08/21 2309 02/09/21 0204 02/09/21 1108 02/09/21 1948 02/10/21 0123  WBC 8.5 9.4 9.7  --  7.4 8.5 7.3  PLT 279 224 204  --  214 213 206  CREATININE 1.64*  --   --  1.31* 1.34*  --  1.34*   Cardiac Enzymes No results for  input(s): CKMB, TROPONINI, MYOGLOBIN in the last 168 hours.  Invalid input(s): CK   Radiology Reports CT ABDOMEN PELVIS W CONTRAST  Result Date: 02/08/2021 CLINICAL DATA:  Nausea and vomiting. EXAM: CT ABDOMEN AND PELVIS WITH CONTRAST TECHNIQUE: Multidetector CT imaging of the abdomen and pelvis was performed using the standard protocol following bolus administration of intravenous contrast. CONTRAST:  60mL OMNIPAQUE IOHEXOL 300 MG/ML  SOLN COMPARISON:  10/19/2019. FINDINGS: Lower chest: Lung bases show volume loss in both lower lobes. Very large hiatal hernia. Atherosclerotic calcification of the aorta and aortic valve. Heart is at the upper limits of normal in size. No pericardial or pleural effusion. Hepatobiliary: Liver and gallbladder are unremarkable. No biliary ductal dilatation. Pancreas: Negative. Spleen: Negative. Adrenals/Urinary Tract: Adrenal glands are unremarkable. Probable small right renal sinus cyst. Additional low-attenuation lesions in the kidneys measure up to 2.8 cm off the lower pole right kidney and are likely cysts. Ureters are decompressed. Bladder is largely obscured by streak artifact from a right hip arthroplasty. Stomach/Bowel: Large hiatal hernia. Stomach, small bowel, appendix and majority of the colon are unremarkable. Fair amount of stool is seen in the descending and rectosigmoid colon. Vascular/Lymphatic: Atherosclerotic calcification of the aorta. No pathologically enlarged lymph nodes. Reproductive: Obscured by streak artifact from a right hip arthroplasty. Other:  No free fluid. Fluid density structure in the right paracolic gutter measures 4.3 x 6.5 cm (3/51), slightly enlarged from 3.1 x 4.6 cm on 10/19/2019. Mesenteries and peritoneum are otherwise unremarkable. Musculoskeletal: Right hip arthroplasty. Degenerative changes in the spine. Grade 1 anterolisthesis of L4 on L5. L3 inferior endplate compression fracture is old. IMPRESSION: 1. No acute findings. 2. Large hiatal hernia. 3. Fluid density structure in the right paracolic gutter has enlarged in the interval and may represent a lymphangioma or GI duplication cyst. 4.  Aortic atherosclerosis (ICD10-I70.0). Electronically Signed   By: Lorin Picket M.D.   On: 02/08/2021 15:39   DG Chest Port 1 View  Result Date: 02/09/2021 CLINICAL DATA:  Shortness of breath EXAM: PORTABLE CHEST 1 VIEW COMPARISON:  And chest x-ray 11/24/2020 FINDINGS: Heart is enlarged. Mediastinum appears stable. Calcified plaques in the aortic arch. Cardiac surgical changes and median sternotomy wires. Large hiatal hernia eccentric to the right again seen. No new consolidation identified. Opacities at the left costophrenic angle similar to previous study consistent with small effusion and atelectasis. No pneumothorax. IMPRESSION: 1. Cardiomegaly. 2. Small left pleural effusion with associated atelectasis. 3. Large hiatal hernia. Electronically Signed   By: Ofilia Neas M.D.   On: 02/09/2021 09:45   ECHOCARDIOGRAM COMPLETE  Result Date: 02/09/2021    ECHOCARDIOGRAM REPORT   Patient Name:   Jon Reid Date of Exam: 02/09/2021 Medical Rec #:  [id number]      Height:       66.0 in Accession #:    [id number]     Weight:       135.0 lb Date of Birth:  07/06/1928      BSA:          1.692 m Patient Age:    36 years       BP:           93/51 mmHg Patient Gender: M              HR:           64 bpm. Exam Location:  Inpatient Procedure: 2D Echo Indications:    NSTEMI  History:        Patient  has prior history of Echocardiogram examinations,  most                 recent 06/26/2020. Prior CABG, chronic kidney disease.; Risk                 Factors:Hypertension, Dyslipidemia and Sleep Apnea.  Sonographer:    Johny Chess RDCS Referring Phys: [id number] [place]  1. Left ventricular ejection fraction, by estimation, is 55 to 60%. The left ventricle has normal function. The left ventricle has no regional wall motion abnormalities. Left ventricular diastolic parameters are consistent with Grade II diastolic dysfunction (pseudonormalization).  2. Right ventricular systolic function is normal. The right ventricular size is normal.  3. Left atrial size was severely dilated.  4. The mitral valve is degenerative. Mild to moderate mitral valve regurgitation. No evidence of mitral stenosis.  5. The aortic valve is tricuspid. There is moderate calcification of the aortic valve. There is moderate thickening of the aortic valve. Aortic valve regurgitation is trivial. Moderate aortic valve stenosis. Aortic valve area, by VTI measures 1.05 cm. Aortic valve mean gradient measures 23.0 mmHg. Aortic valve Vmax measures 3.17 m/s. Comparison(s): No significant change from prior study. Distal septal hypokinesis commented on the prior study. Appears to be postop septum in setting of CABG. AS remains moderate. FINDINGS  Left Ventricle: Left ventricular ejection fraction, by estimation, is 55 to 60%. The left ventricle has normal function. The left ventricle has no regional wall motion abnormalities. The left ventricular internal cavity size was normal in size. There is  no left ventricular hypertrophy. Abnormal (paradoxical) septal motion consistent with post-operative status. Left ventricular diastolic parameters are consistent with Grade II diastolic dysfunction (pseudonormalization). Right Ventricle: The right ventricular size is normal. No increase in right ventricular wall thickness. Right ventricular systolic function is normal. Left Atrium: Left  atrial size was severely dilated. Right Atrium: Right atrial size was normal in size. Pericardium: There is no evidence of pericardial effusion. Mitral Valve: The mitral valve is degenerative in appearance. Mild to moderate mitral annular calcification. Mild to moderate mitral valve regurgitation. No evidence of mitral valve stenosis. Tricuspid Valve: The tricuspid valve is grossly normal. Tricuspid valve regurgitation is mild . No evidence of tricuspid stenosis. Aortic Valve: The aortic valve is tricuspid. There is moderate calcification of the aortic valve. There is moderate thickening of the aortic valve. Aortic valve regurgitation is trivial. Moderate aortic stenosis is present. Aortic valve mean gradient measures 23.0 mmHg. Aortic valve peak gradient measures 40.2 mmHg. Aortic valve area, by VTI measures 1.05 cm. Pulmonic Valve: The pulmonic valve was grossly normal. Pulmonic valve regurgitation is mild to moderate. No evidence of pulmonic stenosis. Aorta: The aortic root and ascending aorta are structurally normal, with no evidence of dilitation. Venous: The right lower pulmonary vein is normal. The inferior vena cava was not well visualized. IAS/Shunts: The atrial septum is grossly normal.  LEFT VENTRICLE PLAX 2D LVIDd:         4.80 cm LVIDs:         3.10 cm LV PW:         1.10 cm LV IVS:        0.90 cm LVOT diam:     2.10 cm LV SV:         76 LV SV Index:   45 LVOT Area:     3.46 cm  RIGHT VENTRICLE RV S prime:     9.79 cm/s TAPSE (M-mode): 1.7 cm LEFT ATRIUM  Index        RIGHT ATRIUM           Index LA diam:        4.40 cm 2.60 cm/m   RA Area:     19.90 cm LA Vol (A2C):   67.7 ml 40.01 ml/m  RA Volume:   51.60 ml  30.49 ml/m LA Vol (A4C):   82.9 ml 48.99 ml/m LA Biplane Vol: 78.5 ml 46.39 ml/m  AORTIC VALVE AV Area (Vmax):    1.01 cm AV Area (Vmean):   0.95 cm AV Area (VTI):     1.05 cm AV Vmax:           317.00 cm/s AV Vmean:          224.000 cm/s AV VTI:            0.725 m AV Peak  Grad:      40.2 mmHg AV Mean Grad:      23.0 mmHg LVOT Vmax:         92.00 cm/s LVOT Vmean:        61.600 cm/s LVOT VTI:          0.219 m LVOT/AV VTI ratio: 0.30  AORTA Ao Asc diam: 3.10 cm MITRAL VALVE               TRICUSPID VALVE MV Area (PHT): 2.93 cm    TR Peak grad:   39.9 mmHg MV Decel Time: 259 msec    TR Vmax:        316.00 cm/s MR Peak grad: 93.3 mmHg MR Mean grad: 60.0 mmHg    SHUNTS MR Vmax:      483.00 cm/s  Systemic VTI:  0.22 m MR Vmean:     359.0 cm/s   Systemic Diam: 2.10 cm MV E velocity: 98.10 cm/s MV A velocity: 52.80 cm/s MV E/A ratio:  1.86 Eleonore Chiquito MD Electronically signed by Eleonore Chiquito MD Signature Date/Time: 02/09/2021/6:17:11 PM    Final

## 2021-02-10 NOTE — Evaluation (Signed)
Occupational Therapy Evaluation Patient Details Name: Jon Reid MRN: [id number] DOB: [birth year] Today's Date: 02/10/2021   History of Present Illness 87 yo male [facility]/23 with hematemesis, elevated troponins cards following and no plan for heart cath (suspect demand ischemia). 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 PMH includes R hip fracture s/p hemiarthroplasty 10/2020,  upper GIB and ABLA due to esophagitis, stage III chronic kidney disease, coronary artery disease with history of first-degree block, GERD, hypertension, history of prostate cancer.   Clinical Impression   Pt admitted for concerns listed above. PTA pt reported that he was independent with all ADL's and IADL's, using a RW for ambulation. At this time, pt presents with increased weakness and balance concerns. He is requiring set up to min A for ADL's and functional mobility. Initially, pt needing increased assist for transfers and balance, however once he was moving he was able to maintain balance with min guard for safety. OT will follow acutely to address concerns listed below.       Recommendations for follow up therapy are one component of a multi-disciplinary discharge planning process, led by the attending physician.  Recommendations may be updated based on patient status, additional functional criteria and insurance authorization.   Follow Up Recommendations  No OT follow up    Assistance Recommended at Discharge Set up Supervision/Assistance  Functional Status Assessment  Patient has had a recent decline in their functional status and demonstrates the ability to make significant improvements in function in a reasonable and predictable amount of time.  Equipment Recommendations  None recommended by OT    Recommendations for Other Services       Precautions /  Restrictions Precautions Precautions: Fall Restrictions Weight Bearing Restrictions: No      Mobility Bed Mobility Overal bed mobility: Needs Assistance Bed Mobility: Supine to Sit;Sit to Supine     Supine to sit: Min assist;HOB elevated Sit to supine: Min guard;HOB elevated   General bed mobility comments: ASsist to sit EOB, pt reports he feels like he is stuck in a hole and having a hard time getting out of it.    Transfers Overall transfer level: Needs assistance Equipment used: Rolling walker (2 wheels) Transfers: Sit to/from Stand Sit to Stand: Min assist;Min guard           General transfer comment: Initial stand pt required min A to power up and steady, 2nd time pt could stand with no assist.      Balance Overall balance assessment: Needs assistance;History of Falls Sitting-balance support: No upper extremity supported;Feet supported Sitting balance-Leahy Scale: Fair     Standing balance support: Bilateral upper extremity supported;During functional activity Standing balance-Leahy Scale: Poor                             ADL either performed or assessed with clinical judgement   ADL Overall ADL's : Needs assistance/impaired Eating/Feeding: Set up;Sitting   Grooming: Set up;Sitting   Upper Body Bathing: Min guard;Sitting   Lower Body Bathing: Minimal assistance;Sitting/lateral leans;Sit to/from stand   Upper Body Dressing : Set up;Sitting   Lower Body Dressing: Minimal assistance;Sitting/lateral leans;Sit to/from stand   Toilet Transfer: Min guard;Ambulation   Toileting- Clothing Manipulation and Hygiene: Minimal assistance;Sitting/lateral lean;Sit to/from stand       Functional mobility during ADLs: Min guard;Rolling  walker (2 wheels) General ADL Comments: Pt presents with weakness and balance concerns, requiring set up to min A     Vision Baseline Vision/History: 1 Wears glasses Ability to See in Adequate Light: 0  Adequate Patient Visual Report: No change from baseline Vision Assessment?: No apparent visual deficits     Perception     Praxis      Pertinent Vitals/Pain Pain Assessment: No/denies pain     Hand Dominance Right   Extremity/Trunk Assessment Upper Extremity Assessment Upper Extremity Assessment: Overall WFL for tasks assessed   Lower Extremity Assessment Lower Extremity Assessment: Defer to PT evaluation   Cervical / Trunk Assessment Cervical / Trunk Assessment: Kyphotic   Communication Communication Communication: HOH   Cognition Arousal/Alertness: Awake/alert Behavior During Therapy: WFL for tasks assessed/performed Overall Cognitive Status: Within Functional Limits for tasks assessed                                       General Comments  VSS on RA, very motivated    Exercises     Shoulder Instructions      Home Living Family/patient expects to be discharged to:: Private residence Living Arrangements: Spouse/significant other Available Help at Discharge: Family;Available PRN/intermittently Type of Home: House Home Access: Stairs to enter     Home Layout: Able to live on main level with bedroom/bathroom;Two level     Bathroom Shower/Tub: Corporate investment banker: Standard Bathroom Accessibility: Yes How Accessible: Accessible via walker Home Equipment: [place] (2 wheels);Shower seat;Toilet riser          Prior Functioning/Environment Prior Level of Function : Independent/Modified Independent;History of Falls (last six months)             Mobility Comments: uses RW for gait, states when not using RW he grabs for furniture ADLs Comments: Pt states he is independent with ADLs        OT Problem List: Decreased strength;Impaired balance (sitting and/or standing);Decreased knowledge of use of DME or AE      OT Treatment/Interventions: Self-care/ADL training;Therapeutic exercise;Energy conservation;DME  and/or AE instruction;Therapeutic activities;Patient/family education;Balance training    OT Goals(Current goals can be found in the care plan section) Acute Rehab OT Goals Patient Stated Goal: To go home OT Goal Formulation: With patient Time For Goal Achievement: 02/24/21 Potential to Achieve Goals: Good ADL Goals Pt Will Perform Grooming: with modified independence;standing Pt Will Perform Lower Body Bathing: with supervision;sitting/lateral leans;sit to/from stand Pt Will Perform Lower Body Dressing: with supervision;sitting/lateral leans;sit to/from stand Pt Will Transfer to Toilet: with supervision;ambulating Pt Will Perform Toileting - Clothing Manipulation and hygiene: with supervision;sitting/lateral leans;sit to/from stand Additional ADL Goal #1: Pt will verbalize 3 fall prevention techniques that he can use at home to prevent falls.  OT Frequency: Min 2X/week   Barriers to D/C:            Co-evaluation              AM-PAC OT "6 Clicks" Daily Activity     Outcome Measure Help from another person eating meals?: A Little Help from another person taking care of personal grooming?: A Little Help from another person toileting, which includes using toliet, bedpan, or urinal?: A Little Help from another person bathing (including washing, rinsing, drying)?: A Little Help from another person to put on and taking off regular upper body clothing?: A Little Help from another person  to put on and taking off regular lower body clothing?: A Little 6 Click Score: 18   End of Session Equipment Utilized During Treatment: Gait belt;Rolling walker (2 wheels) Nurse Communication: Mobility status  Activity Tolerance: Patient tolerated treatment well Patient left: in bed;with call bell/phone within reach  OT Visit Diagnosis: Unsteadiness on feet (R26.81);Other abnormalities of gait and mobility (R26.89);Muscle weakness (generalized) (M62.81);History of falling (Z91.81)                 Time: 6381-7711 OT Time Calculation (min): 24 min Charges:  OT General Charges $OT Visit: 1 Visit OT Evaluation $OT Eval Moderate Complexity: 1 Mod OT Treatments $Therapeutic Activity: 8-22 mins  Keyondra Lagrand H., OTR/L Acute Rehabilitation  Riot Barrick Elane Alyan Hartline 02/10/2021, 6:02 PM

## 2021-02-10 NOTE — Evaluation (Signed)
Clinical/Bedside Swallow Evaluation Patient Details  Name: Jon Reid MRN: [id number] Date of Birth: Nov 18, 1928  Today's Date: 02/10/2021 Time: SLP Start Time (ACUTE ONLY): 1412 SLP Stop Time (ACUTE ONLY): 1433 SLP Time Calculation (min) (ACUTE ONLY): 21 min  Past Medical History:  Past Medical History:  Diagnosis Date   Arthritis    Carcinoma of prostate ([place])    prostate   CKD (chronic kidney disease), stage III ([place])    Coronary artery disease    a. 10/2012 Cath: LM 20-30d, lAD 80p, 71m 80-90d, LCX 90-949mOM1 90, RCA 50p, 2064m0d, RPDA 90, EF nl; b. 10/2012 CABG x 4 (LIMA-LAD, SVG-OM1, SVG-PDA->RPL); c. 07/2015 MV: Basal and proximal septal infarct w/o ischemia. EF 57%-->low risk.   First degree AV block 01/28/2019   Noted on EKG   GERD (gastroesophageal reflux disease)    Gout    HTN (hypertension)    Macrocytosis without anemia 01/08/2012   Moderate aortic stenosis    a. 10/2012 Echo: EF 55-60%, no rwma, BAE, mild AI, mild TR; b. 06/2020 Echo: EF 50-55%, no rwma, mild LVH. Mod dil LA. Mild MR. Mod AS.   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   Sleep apnea    Questionable   Spinal stenosis    [facility]  Past Surgical History:  Past Surgical History:  Procedure Laterality Date   BALLOON DILATION N/A 10/03/2020   Procedure: BALLOON DILATION;  Surgeon: CarEloise HarmanO;  [facility];  Service: Endoscopy;  Laterality: N/A;   CARDIAC CATHETERIZATION  11/07/2012   Dr NahAcie FredricksonCATARACT EXTRACTION W/ INTRAOCULAR LENS IMPLANT Bilateral    CORONARY ARTERY BYPASS GRAFT N/A 11/11/2012   Procedure: CORONARY ARTERY BYPASS GRAFTING times four using Right Greater Saphenous Vein Graft harvested endoscopically and Left Internal Mammary Artery.;  Surgeon: PetIvin PootD;  [facility] [place];  Laterality: N/A;   ESOPHAGEAL BRUSHING  10/03/2020   Procedure:  ESOPHAGEAL BRUSHING;  Surgeon: CarEloise HarmanO;  [facility];  Service: Endoscopy;;   ESOPHAGOGASTRODUODENOSCOPY (EGD) WITH PROPOFOL N/A 06/27/2020   Surgeon: CarEloise HarmanO;  large hiatal hernia, LA grade D esophagitis with no bleeding, normal examined duodenum.    ESOPHAGOGASTRODUODENOSCOPY (EGD) WITH PROPOFOL N/A 10/03/2020   Procedure: ESOPHAGOGASTRODUODENOSCOPY (EGD) WITH PROPOFOL;  Surgeon: CarEloise HarmanO;  [facility];  Service: Endoscopy;  Laterality: N/A;  2:00pm   HIP ARTHROPLASTY Right 11/13/2020   Procedure: ARTHROPLASTY BIPOLAR HIP (HEMIARTHROPLASTY);  Surgeon: MurRenette ButtersD;  [facility] [place];  Laterality: Right;   INGUINAL HERNIA REPAIR Right 01/10/2017   Procedure: OPEN RIGHT HERNIA REPAIR INGUINAL;  Surgeon: WhiIleana RoupD;  [facility];  Service: General;  Laterality: Right;   INSERTION OF MESH Right 01/10/2017   Procedure: INSERTION OF MESH;  Surgeon: WhiIleana RoupD;  [facility];  Service: General;  Laterality: Right;   INTRAOPERATIVE TRANSESOPHAGEAL ECHOCARDIOGRAM N/A 11/11/2012   Procedure: INTRAOPERATIVE TRANSESOPHAGEAL ECHOCARDIOGRAM;  Surgeon: PetIvin PootD;  [facility] [place] [facility];  Laterality: N/A;   LEFT HEART CATHETERIZATION WITH CORONARY ANGIOGRAM N/A 11/07/2012   Procedure: LEFT HEART CATHETERIZATION WITH CORONARY ANGIOGRAM;  Surgeon: PhiThayer HeadingsD;  [facility];  Service: Cardiovascular;  Laterality: N/A;   LUMBAR LAMINECTOMY/DECOMPRESSION MICRODISCECTOMY N/A  02/05/2019   Procedure: LUMBAR LAMINECTOMY/DECOMPRESSION L3-L4;  Surgeon: Latanya Maudlin, MD;  Location: [facility];  Service: Orthopedics;  Laterality: N/A;  69mn   LYMPH NODE DISSECTION     Bilateral pelvic   RETROPUBIC PROSTATECTOMY     Radical   HPI:  Pt is a 87y.o. male who presented to the ED secondary to vomiting, black colored vomit, and dark stools.  Pt  found to have mild AKI. GI consulted and are deferring repeat EGD; rx diet be advanced; a soft diet with thin liquids was ordered by attending. PMH: CKD stage III, CAD s/p CABGx4, hx of CVA, first degree heart block, GERD, HTN, prostate cancer, upper GI bleeds and acute blood loss anemia due to LA grade D esophagitis-last EGD in 09/2020. MBS 11/25/20: mild-moderate oropharyngeal dysphagia with likely esophageal component secondary to h/o large hiatal hernia. Delayed A-P transport, decreased bolus cohesion with barium tablet in thin liquid barium, trace penetration (PAS 3) and aspiration (PAS 8) of thin liquids when a chin tuck posture was attempted, reduced PES distention.  Pt was discharged from [facility] on dysphagia 3 solids and thin liquid.    Assessment / Plan / Recommendation  Clinical Impression  Pt was seen for bedside swallow evaluation. He reported intake of regular textures, but he stated that hard apples and the crust of cornbread inconsistently "get stuck", but that he is able to cough them up. Pt stated that he compensates for this by purchasing softer apples and using smaller boluses. Oral mechanism exam WFL and he presented with reduced, natural dentition. Pt's lunch tray arrived during the session and it included a roll, spaghetti, broccoli, and mac and cheese. Pt demonstrated intermittent throat clearing throughout the meal, but this appears to be his baseline as was noted on his discharge note from [facility]. No s/sx of aspiration were noted with liquids. M[facility] and [facility] solids such as undercooked broccoli. Pt's current diet of soft solids and thin liquids will be continued and, considering pt's history of dysphagia, SLP will follow briefly. SLP Visit Diagnosis: Dysphagia, unspecified (R13.10)    Aspiration Risk  Mild aspiration risk    Diet Recommendation Dysphagia 3 (Mech soft);Thin liquid (Continue soft diet)   Liquid Administration via:  Cup;Straw Medication Administration: Whole meds with liquid Supervision: Patient able to self feed Compensations: Slow rate;Small sips/bites Postural Changes: Seated upright at 90 degrees    Other  Recommendations Oral Care Recommendations: Oral care BID    Recommendations for follow up therapy are one component of a multi-disciplinary discharge planning process, led by the attending physician.  Recommendations may be updated based on patient status, additional functional criteria and insurance authorization.  Follow up Recommendations No SLP follow up      Assistance Recommended at Discharge None  Functional Status Assessment    Frequency and Duration min 1 x/week  1 week       Prognosis Prognosis for Safe Diet Advancement: Good      Swallow Study   General Date of Onset: 11/25/20 HPI: Pt is a 87y.o. male who presented to the ED secondary to vomiting, black colored vomit, and dark stools.  Pt found to have mild AKI. GI consulted and are deferring repeat EGD; rx diet be advanced; a soft diet with thin liquids was ordered by attending. PMH: CKD stage III, CAD s/p CABGx4, hx of CVA, first degree heart block, GERD, HTN, prostate cancer, upper GI bleeds and acute blood loss anemia due to  LA grade D esophagitis-last EGD in 09/2020. MBS 11/25/20: mild-moderate oropharyngeal dysphagia with likely esophageal component secondary to h/o large hiatal hernia. Delayed A-P transport, decreased bolus cohesion with barium tablet in thin liquid barium, trace penetration (PAS 3) and aspiration (PAS 8) of thin liquids when a chin tuck posture was attempted, reduced PES distention.  Pt was discharged from [facility] on dysphagia 3 solids and thin liquid. Type of Study: Bedside Swallow Evaluation Previous Swallow Assessment: See HPI Diet Prior to this Study: Dysphagia 3 (soft);Thin liquids Temperature Spikes Noted: No Respiratory Status: Room air History of Recent Intubation: No Behavior/Cognition:  Alert;Cooperative;Pleasant mood Oral Cavity Assessment: Within Functional Limits Oral Care Completed by SLP: No Oral Cavity - Dentition: Missing dentition Vision: Functional for self-feeding Self-Feeding Abilities: Needs assist Patient Positioning: Upright in bed;Postural control adequate for testing Baseline Vocal Quality: Normal Volitional Cough: Strong Volitional Swallow: Able to elicit    Oral/Motor/Sensory Function Overall Oral Motor/Sensory Function: Within functional limits   Ice Chips Ice chips: Not tested   Thin Liquid Thin Liquid: Within functional limits Presentation: Straw    Nectar Thick Nectar Thick Liquid: Not tested   Honey Thick Honey Thick Liquid: Not tested   Puree Puree: Within functional limits Presentation: Spoon   Solid     Solid: Within functional limits Presentation: [place] Hardin Negus, [place], [place] Office number [phone] Pager [place] 02/10/2021,2:39 PM

## 2021-02-10 NOTE — Progress Notes (Signed)
Progress Note  Patient Name: Jon Reid Date of Encounter: 02/10/2021  [facility] Cardiologist: Minus Breeding, MD   Subjective   Patient reports progressive LLE over the last few weeks. No chest pain. Hgb and kidney function stable.   Inpatient Medications    Scheduled Meds:  sodium chloride   Intravenous Once   pantoprazole (PROTONIX) IV  40 mg Intravenous Q12H   polyethylene glycol  17 g Oral Daily   tobramycin   Left Eye TID   Continuous Infusions:  PRN Meds: acetaminophen **OR** acetaminophen, prochlorperazine   Vital Signs    Vitals:   02/10/21 0730 02/10/21 0734 02/10/21 0813 02/10/21 1143  BP: (!) 97/43  (!) 102/58 (!) 102/58  Pulse: (!) 53 (!) 56 62 (!) 56  Resp: 17 17 18 20   Temp: 97.7 F (36.5 C)   97.7 F (36.5 C)  TempSrc: Axillary   Axillary  SpO2: 95%  96% 97%  Weight:      Height:        Intake/Output Summary (Last 24 hours) at 02/10/2021 1150 Last data filed at 02/10/2021 1144 Gross per 24 hour  Intake 1003.53 ml  Output 1000 ml  Net 3.53 ml   Last 3 Weights 02/08/2021 02/01/2021 01/26/2021  Weight (lbs) 135 lb 147 lb 12.8 oz 148 lb 3.2 oz  Weight (kg) 61.236 kg 67.042 kg 67.223 kg      Telemetry    SR/SB, PVCs, 7 beats NSVT, HR 50-60s - Personally Reviewed  ECG    NO new - Personally Reviewed  Physical Exam   GEN: No acute distress.   Neck: No JVD Cardiac: RRR, no murmurs, rubs, or gallops.  Respiratory: Clear to auscultation bilaterally. GI: Soft, nontender, non-distended  MS: 1+ lower leg edema; No deformity. Neuro:  Nonfocal  Psych: Normal affect   Labs    High Sensitivity Troponin:   Recent Labs  Lab 02/08/21 2117 02/08/21 2309  TROPONINIHS 3,825* 7,183*     Chemistry Recent Labs  Lab 02/08/21 1218 02/08/21 2117 02/09/21 0204 02/09/21 1108 02/10/21 0123  NA 141  --  140 140 139  K 3.4*  --  3.7 3.6 3.4*  CL 101  --  110 111 109  CO2 30  --  23 24 25   GLUCOSE 150*  --  95 132* 101*  BUN 22   --  39* 34* 28*  CREATININE 1.64*  --  1.31* 1.34* 1.34*  CALCIUM 9.0  --  8.1* 8.3* 8.1*  MG  --  2.0  --  2.0 1.9  PROT 7.0  --   --   --  4.7*  ALBUMIN 3.6  --   --   --  2.4*  AST 25  --   --   --  94*  ALT 12  --   --   --  26  ALKPHOS 93  --   --   --  63  BILITOT 0.6  --   --   --  0.8  GFRNONAA 39*  --  52* 50* 50*  ANIONGAP 10  --  7 5 5     Lipids No results for input(s): CHOL, TRIG, HDL, LABVLDL, LDLCALC, CHOLHDL in the last 168 hours.  Hematology Recent Labs  Lab 02/09/21 1108 02/09/21 1948 02/10/21 0123  WBC 7.4 8.5 7.3  RBC 2.74* 2.76* 2.60*  HGB 8.4* 8.7* 8.2*  HCT 26.5* 26.6* 25.3*  MCV 96.7 96.4 97.3  MCH 30.7 31.5 31.5  MCHC 31.7 32.7 32.4  RDW 18.1* 18.4* 18.3*  PLT 214 213 206   Thyroid No results for input(s): TSH, FREET4 in the last 168 hours.  BNP Recent Labs  Lab 02/09/21 1108  BNP 1,423.4*    DDimer No results for input(s): DDIMER in the last 168 hours.   Radiology    CT ABDOMEN PELVIS W CONTRAST  Result Date: 02/08/2021 CLINICAL DATA:  Nausea and vomiting. EXAM: CT ABDOMEN AND PELVIS WITH CONTRAST TECHNIQUE: Multidetector CT imaging of the abdomen and pelvis was performed using the standard protocol following bolus administration of intravenous contrast. CONTRAST:  48mL OMNIPAQUE IOHEXOL 300 MG/ML  SOLN COMPARISON:  10/19/2019. FINDINGS: Lower chest: Lung bases show volume loss in both lower lobes. Very large hiatal hernia. Atherosclerotic calcification of the aorta and aortic valve. Heart is at the upper limits of normal in size. No pericardial or pleural effusion. Hepatobiliary: Liver and gallbladder are unremarkable. No biliary ductal dilatation. Pancreas: Negative. Spleen: Negative. Adrenals/Urinary Tract: Adrenal glands are unremarkable. Probable small right renal sinus cyst. Additional low-attenuation lesions in the kidneys measure up to 2.8 cm off the lower pole right kidney and are likely cysts. Ureters are decompressed. Bladder is largely  obscured by streak artifact from a right hip arthroplasty. Stomach/Bowel: Large hiatal hernia. Stomach, small bowel, appendix and majority of the colon are unremarkable. Fair amount of stool is seen in the descending and rectosigmoid colon. Vascular/Lymphatic: Atherosclerotic calcification of the aorta. No pathologically enlarged lymph nodes. Reproductive: Obscured by streak artifact from a right hip arthroplasty. Other: No free fluid. Fluid density structure in the right paracolic gutter measures 4.3 x 6.5 cm (3/51), slightly enlarged from 3.1 x 4.6 cm on 10/19/2019. Mesenteries and peritoneum are otherwise unremarkable. Musculoskeletal: Right hip arthroplasty. Degenerative changes in the spine. Grade 1 anterolisthesis of L4 on L5. L3 inferior endplate compression fracture is old. IMPRESSION: 1. No acute findings. 2. Large hiatal hernia. 3. Fluid density structure in the right paracolic gutter has enlarged in the interval and may represent a lymphangioma or GI duplication cyst. 4.  Aortic atherosclerosis (ICD10-I70.0). Electronically Signed   By: Lorin Picket M.D.   On: 02/08/2021 15:39   DG Chest Port 1 View  Result Date: 02/09/2021 CLINICAL DATA:  Shortness of breath EXAM: PORTABLE CHEST 1 VIEW COMPARISON:  And chest x-ray 11/24/2020 FINDINGS: Heart is enlarged. Mediastinum appears stable. Calcified plaques in the aortic arch. Cardiac surgical changes and median sternotomy wires. Large hiatal hernia eccentric to the right again seen. No new consolidation identified. Opacities at the left costophrenic angle similar to previous study consistent with small effusion and atelectasis. No pneumothorax. IMPRESSION: 1. Cardiomegaly. 2. Small left pleural effusion with associated atelectasis. 3. Large hiatal hernia. Electronically Signed   By: Ofilia Neas M.D.   On: 02/09/2021 09:45   ECHOCARDIOGRAM COMPLETE  Result Date: 02/09/2021    ECHOCARDIOGRAM REPORT   Patient Name:   Jon Reid Date of Exam:  02/09/2021 Medical Rec #:  [id number]      Height:       66.0 in Accession #:    [id number]     Weight:       135.0 lb Date of Birth:  1929-02-16      BSA:          1.692 m Patient Age:    87 years       BP:           93/51 mmHg Patient Gender: M  HR:           64 bpm. Exam Location:  Inpatient Procedure: 2D Echo Indications:    NSTEMI  History:        Patient has prior history of Echocardiogram examinations, most                 recent 06/26/2020. Prior CABG, chronic kidney disease.; Risk                 Factors:Hypertension, Dyslipidemia and Sleep Apnea.  Sonographer:    Johny Chess RDCS Referring Phys: [id number] [place]  1. Left ventricular ejection fraction, by estimation, is 55 to 60%. The left ventricle has normal function. The left ventricle has no regional wall motion abnormalities. Left ventricular diastolic parameters are consistent with Grade II diastolic dysfunction (pseudonormalization).  2. Right ventricular systolic function is normal. The right ventricular size is normal.  3. Left atrial size was severely dilated.  4. The mitral valve is degenerative. Mild to moderate mitral valve regurgitation. No evidence of mitral stenosis.  5. The aortic valve is tricuspid. There is moderate calcification of the aortic valve. There is moderate thickening of the aortic valve. Aortic valve regurgitation is trivial. Moderate aortic valve stenosis. Aortic valve area, by VTI measures 1.05 cm. Aortic valve mean gradient measures 23.0 mmHg. Aortic valve Vmax measures 3.17 m/s. Comparison(s): No significant change from prior study. Distal septal hypokinesis commented on the prior study. Appears to be postop septum in setting of CABG. AS remains moderate. FINDINGS  Left Ventricle: Left ventricular ejection fraction, by estimation, is 55 to 60%. The left ventricle has normal function. The left ventricle has no regional wall motion abnormalities. The left ventricular internal cavity  size was normal in size. There is  no left ventricular hypertrophy. Abnormal (paradoxical) septal motion consistent with post-operative status. Left ventricular diastolic parameters are consistent with Grade II diastolic dysfunction (pseudonormalization). Right Ventricle: The right ventricular size is normal. No increase in right ventricular wall thickness. Right ventricular systolic function is normal. Left Atrium: Left atrial size was severely dilated. Right Atrium: Right atrial size was normal in size. Pericardium: There is no evidence of pericardial effusion. Mitral Valve: The mitral valve is degenerative in appearance. Mild to moderate mitral annular calcification. Mild to moderate mitral valve regurgitation. No evidence of mitral valve stenosis. Tricuspid Valve: The tricuspid valve is grossly normal. Tricuspid valve regurgitation is mild . No evidence of tricuspid stenosis. Aortic Valve: The aortic valve is tricuspid. There is moderate calcification of the aortic valve. There is moderate thickening of the aortic valve. Aortic valve regurgitation is trivial. Moderate aortic stenosis is present. Aortic valve mean gradient measures 23.0 mmHg. Aortic valve peak gradient measures 40.2 mmHg. Aortic valve area, by VTI measures 1.05 cm. Pulmonic Valve: The pulmonic valve was grossly normal. Pulmonic valve regurgitation is mild to moderate. No evidence of pulmonic stenosis. Aorta: The aortic root and ascending aorta are structurally normal, with no evidence of dilitation. Venous: The right lower pulmonary vein is normal. The inferior vena cava was not well visualized. IAS/Shunts: The atrial septum is grossly normal.  LEFT VENTRICLE PLAX 2D LVIDd:         4.80 cm LVIDs:         3.10 cm LV PW:         1.10 cm LV IVS:        0.90 cm LVOT diam:     2.10 cm LV SV:  76 LV SV Index:   45 LVOT Area:     3.46 cm  RIGHT VENTRICLE RV S prime:     9.79 cm/s TAPSE (M-mode): 1.7 cm LEFT ATRIUM             Index        RIGHT  ATRIUM           Index LA diam:        4.40 cm 2.60 cm/m   RA Area:     19.90 cm LA Vol (A2C):   67.7 ml 40.01 ml/m  RA Volume:   51.60 ml  30.49 ml/m LA Vol (A4C):   82.9 ml 48.99 ml/m LA Biplane Vol: 78.5 ml 46.39 ml/m  AORTIC VALVE AV Area (Vmax):    1.01 cm AV Area (Vmean):   0.95 cm AV Area (VTI):     1.05 cm AV Vmax:           317.00 cm/s AV Vmean:          224.000 cm/s AV VTI:            0.725 m AV Peak Grad:      40.2 mmHg AV Mean Grad:      23.0 mmHg LVOT Vmax:         92.00 cm/s LVOT Vmean:        61.600 cm/s LVOT VTI:          0.219 m LVOT/AV VTI ratio: 0.30  AORTA Ao Asc diam: 3.10 cm MITRAL VALVE               TRICUSPID VALVE MV Area (PHT): 2.93 cm    TR Peak grad:   39.9 mmHg MV Decel Time: 259 msec    TR Vmax:        316.00 cm/s MR Peak grad: 93.3 mmHg MR Mean grad: 60.0 mmHg    SHUNTS MR Vmax:      483.00 cm/s  Systemic VTI:  0.22 m MR Vmean:     359.0 cm/s   Systemic Diam: 2.10 cm MV E velocity: 98.10 cm/s MV A velocity: 52.80 cm/s MV E/A ratio:  1.86 Eleonore Chiquito MD Electronically signed by Eleonore Chiquito MD Signature Date/Time: 02/09/2021/6:17:11 PM    Final     Cardiac Studies   Echo 02/09/21   1. Left ventricular ejection fraction, by estimation, is 55 to 60%. The  left ventricle has normal function. The left ventricle has no regional  wall motion abnormalities. Left ventricular diastolic parameters are  consistent with Grade II diastolic  dysfunction (pseudonormalization).   2. Right ventricular systolic function is normal. The right ventricular  size is normal.   3. Left atrial size was severely dilated.   4. The mitral valve is degenerative. Mild to moderate mitral valve  regurgitation. No evidence of mitral stenosis.   5. The aortic valve is tricuspid. There is moderate calcification of the  aortic valve. There is moderate thickening of the aortic valve. Aortic  valve regurgitation is trivial. Moderate aortic valve stenosis. Aortic  valve area, by VTI measures  1.05 cm.  Aortic valve mean gradient measures 23.0 mmHg. Aortic valve Vmax measures  3.17 m/s.   Comparison(s): No significant change from prior study. Distal septal  hypokinesis commented on the prior study. Appears to be postop septum in  setting of CABG. AS remains moderate.   Patient Profile     87 y.o. male with a PMHx of  history of CAD s/p CABG x  4 in2014, HTN, HLD, moderate AS, stroke, CKD III, prostate cancer, and bifascicular block and hx of GIB and esophagitis and history of COVID who is being seen 02/09/2021 for the evaluation of elevated troponins in the setting of GIB. Transferred from [facility].   Assessment & Plan   Elevated troponin  CAD s/p CABG - elevated troponin up to 7,183, in the setting of GIB and hypotension and AKI - no anginal symptoms reported  - EKG with nonspecific changes - not a candidate for IV heparin or ASA - Echo showed LVEF 55-60%, G2DD, normal RV function, LA dialted, mild to mod MR, mod AS, no significant change - No BB due to hypotension - continue statin - given he is asymptomatic can consider OP work-up  GIB/hypotension - no clear evidence of GIB the last few days - s/p 1 unit PRBC and IVF - H&H, 8.2/25.3 - IV PPI - per IM/GI team  AKI on CKD stage 3 - baseline Scr 1.2 - Scr today 1.32, BUN 28 - s/p IVF  Elevated BNP HFpEF H/o LLE - Echo with LVEF 55-60%, G2DD - generally LLE managed conservatively as OP - BNP elevated to 1400 this admission - LLE edema on exam - BP low, will give IV lasix 40mg  daily  For questions or updates, please contact [facility] Please consult [email or web address] for contact info under        Signed, Kohana Amble Ninfa Meeker, PA-C  02/10/2021, 11:50 AM

## 2021-02-10 NOTE — Progress Notes (Signed)
Progress Note Hospital Day: 3  Chief Complaint:    GI bleed     ASSESSMENT AND PLAN   #87 year old male followed by [facility] for history of upper GI bleed / large hiatal hernia / severe esophagitis / iron deficiency anemia in April 2022 . Also with large HH and candida esophagitis on follow up EGD July 2022. Transferred from [facility] 11/23 with recurrent upper GI bleed and acute on chronic anemia.   He reports darker than normal stool a few days ago (on iron). FOBT negative in ED. Bleed probably secondary to esophagitis . Cameron's erosions possible given large HH. PUD not excluded.   AVMs in setting of AS another possibility   --No evidence for GI bleeding today or yesterday.  --Mild improvement in hgb 7.9 >> 8.2 post 1uPRBC yesterday  --Continue BID PPI --Likely to hold off on repeating EGD since he had one in April and July 2022 for similar presentation. Discussed importance of propping himself up at night / going to bed on empty stomach to avoid reflux. --There was fair amount of stool in colon on CT scan 11/23 and he hasn't had a BM in a couple of days so will start daily miralax. 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 Fluid density structure in the right paracolic gutter has enlarged in the interval and may represent a lymphangioma or GI duplication cyst.   #AKI superimposed on CKD 3.  Suspected prerenal in the setting of vomiting.  Creatinine stable at 1.32.   # Elevated BNP 1423  / elevated troponin ( 7.183 ). Hx of CAD / remote CABG. Cardiology has evaluated, no evidence for ACS. Elevated troponin felt to be demand ischemia in setting of GI bleed with hypotension and AKI. Not candidate for ischemic workup right now. Echo yesterday >>> EF 55-60%   #Prolonged QT interval, new finding on telemetry.  Electrolytes being replaced  # Left eye irritation. Lower lid red and swollen. He reports some drainage from the eye  but no pain    SUBJECTIVE     No BMs in a couple of days. Feels okay   OBJECTIVE      Scheduled inpatient medications:   sodium chloride   Intravenous Once   pantoprazole (PROTONIX) IV  40 mg Intravenous Q12H   Continuous inpatient infusions:  PRN inpatient medications: acetaminophen **OR** acetaminophen, prochlorperazine  Vital signs in last 24 hours: Temp:  [97.7 F (36.5 C)-98.7 F (37.1 C)] 97.7 F (36.5 C) (11/25 0730) Pulse Rate:  [53-67] 62 (11/25 0813) Resp:  [16-20] 18 (11/25 0813) BP: (86-102)/(43-58) 102/58 (11/25 0813) SpO2:  [95 %-97 %] 96 % (11/25 0813) Last BM Date: 02/08/21  Intake/Output Summary (Last 24 hours) at 02/10/2021 0931 Last data filed at 02/10/2021 0654 Gross per 24 hour  Intake 1003.53 ml  Output 850 ml  Net 153.53 ml     Physical Exam:  General: Alert male in NAD Heart:  Regular rate and rhythm, murmur present. No lower extremity edema Pulmonary: Normal respiratory effort Abdomen: Soft, nondistended, nontender. Normal bowel sounds.  Neurologic: Alert and oriented Psych: Pleasant. Cooperative.   Filed Weights   02/08/21 1206  Weight: 61.2 kg    Intake/Output from previous day: 11/24 0701 - 11/25 0700 In: 1567.3 [P.O.:220; I.V.:1003.5; Blood:343.8] Out: 1200 [Urine:1200] Intake/Output this shift: No intake/output data recorded.    Lab Results: Recent Labs    02/09/21 1108 02/09/21 1948  02/10/21 0123  WBC 7.4 8.5 7.3  HGB 8.4* 8.7* 8.2*  HCT 26.5* 26.6* 25.3*  PLT 214 213 206   BMET Recent Labs    02/09/21 0204 02/09/21 1108 02/10/21 0123  NA 140 140 139  K 3.7 3.6 3.4*  CL 110 111 109  CO2 23 24 25   GLUCOSE 95 132* 101*  BUN 39* 34* 28*  CREATININE 1.31* 1.34* 1.34*  CALCIUM 8.1* 8.3* 8.1*   LFT Recent Labs    02/10/21 0123  PROT 4.7*  ALBUMIN 2.4*  AST 94*  ALT 26  ALKPHOS 63  BILITOT 0.8   PT/INR No results for input(s): LABPROT, INR in the last 72 hours. Hepatitis Panel No results for  input(s): HEPBSAG, HCVAB, HEPAIGM, HEPBIGM in the last 72 hours.  CT ABDOMEN PELVIS W CONTRAST  Result Date: 02/08/2021 CLINICAL DATA:  Nausea and vomiting. EXAM: CT ABDOMEN AND PELVIS WITH CONTRAST TECHNIQUE: Multidetector CT imaging of the abdomen and pelvis was performed using the standard protocol following bolus administration of intravenous contrast. CONTRAST:  46mL OMNIPAQUE IOHEXOL 300 MG/ML  SOLN COMPARISON:  10/19/2019. FINDINGS: Lower chest: Lung bases show volume loss in both lower lobes. Very large hiatal hernia. Atherosclerotic calcification of the aorta and aortic valve. Heart is at the upper limits of normal in size. No pericardial or pleural effusion. Hepatobiliary: Liver and gallbladder are unremarkable. No biliary ductal dilatation. Pancreas: Negative. Spleen: Negative. Adrenals/Urinary Tract: Adrenal glands are unremarkable. Probable small right renal sinus cyst. Additional low-attenuation lesions in the kidneys measure up to 2.8 cm off the lower pole right kidney and are likely cysts. Ureters are decompressed. Bladder is largely obscured by streak artifact from a right hip arthroplasty. Stomach/Bowel: Large hiatal hernia. Stomach, small bowel, appendix and majority of the colon are unremarkable. Fair amount of stool is seen in the descending and rectosigmoid colon. Vascular/Lymphatic: Atherosclerotic calcification of the aorta. No pathologically enlarged lymph nodes. Reproductive: Obscured by streak artifact from a right hip arthroplasty. Other: No free fluid. Fluid density structure in the right paracolic gutter measures 4.3 x 6.5 cm (3/51), slightly enlarged from 3.1 x 4.6 cm on 10/19/2019. Mesenteries and peritoneum are otherwise unremarkable. Musculoskeletal: Right hip arthroplasty. Degenerative changes in the spine. Grade 1 anterolisthesis of L4 on L5. L3 inferior endplate compression fracture is old. IMPRESSION: 1. No acute findings. 2. Large hiatal hernia. 3. Fluid density structure  in the right paracolic gutter has enlarged in the interval and may represent a lymphangioma or GI duplication cyst. 4.  Aortic atherosclerosis (ICD10-I70.0). Electronically Signed   By: Lorin Picket M.D.   On: 02/08/2021 15:39   DG Chest Port 1 View  Result Date: 02/09/2021 CLINICAL DATA:  Shortness of breath EXAM: PORTABLE CHEST 1 VIEW COMPARISON:  And chest x-ray 11/24/2020 FINDINGS: Heart is enlarged. Mediastinum appears stable. Calcified plaques in the aortic arch. Cardiac surgical changes and median sternotomy wires. Large hiatal hernia eccentric to the right again seen. No new consolidation identified. Opacities at the left costophrenic angle similar to previous study consistent with small effusion and atelectasis. No pneumothorax. IMPRESSION: 1. Cardiomegaly. 2. Small left pleural effusion with associated atelectasis. 3. Large hiatal hernia. Electronically Signed   By: Ofilia Neas M.D.   On: 02/09/2021 09:45   ECHOCARDIOGRAM COMPLETE  Result Date: 02/09/2021    ECHOCARDIOGRAM REPORT   Patient Name:   Jon Reid Date of Exam: 02/09/2021 Medical Rec #:  [id number]      Height:       66.0 in  Accession #:    [id number]     Weight:       135.0 lb Date of Birth:  23-Nov-1928      BSA:          1.692 m Patient Age:    62 years       BP:           93/51 mmHg Patient Gender: M              HR:           64 bpm. Exam Location:  Inpatient Procedure: 2D Echo Indications:    NSTEMI  History:        Patient has prior history of Echocardiogram examinations, most                 recent 06/26/2020. Prior CABG, chronic kidney disease.; Risk                 Factors:Hypertension, Dyslipidemia and Sleep Apnea.  Sonographer:    Johny Chess RDCS Referring Phys: [id number] [place]  1. Left ventricular ejection fraction, by estimation, is 55 to 60%. The left ventricle has normal function. The left ventricle has no regional wall motion abnormalities. Left ventricular diastolic parameters  are consistent with Grade II diastolic dysfunction (pseudonormalization).  2. Right ventricular systolic function is normal. The right ventricular size is normal.  3. Left atrial size was severely dilated.  4. The mitral valve is degenerative. Mild to moderate mitral valve regurgitation. No evidence of mitral stenosis.  5. The aortic valve is tricuspid. There is moderate calcification of the aortic valve. There is moderate thickening of the aortic valve. Aortic valve regurgitation is trivial. Moderate aortic valve stenosis. Aortic valve area, by VTI measures 1.05 cm. Aortic valve mean gradient measures 23.0 mmHg. Aortic valve Vmax measures 3.17 m/s. Comparison(s): No significant change from prior study. Distal septal hypokinesis commented on the prior study. Appears to be postop septum in setting of CABG. AS remains moderate. FINDINGS  Left Ventricle: Left ventricular ejection fraction, by estimation, is 55 to 60%. The left ventricle has normal function. The left ventricle has no regional wall motion abnormalities. The left ventricular internal cavity size was normal in size. There is  no left ventricular hypertrophy. Abnormal (paradoxical) septal motion consistent with post-operative status. Left ventricular diastolic parameters are consistent with Grade II diastolic dysfunction (pseudonormalization). Right Ventricle: The right ventricular size is normal. No increase in right ventricular wall thickness. Right ventricular systolic function is normal. Left Atrium: Left atrial size was severely dilated. Right Atrium: Right atrial size was normal in size. Pericardium: There is no evidence of pericardial effusion. Mitral Valve: The mitral valve is degenerative in appearance. Mild to moderate mitral annular calcification. Mild to moderate mitral valve regurgitation. No evidence of mitral valve stenosis. Tricuspid Valve: The tricuspid valve is grossly normal. Tricuspid valve regurgitation is mild . No evidence of  tricuspid stenosis. Aortic Valve: The aortic valve is tricuspid. There is moderate calcification of the aortic valve. There is moderate thickening of the aortic valve. Aortic valve regurgitation is trivial. Moderate aortic stenosis is present. Aortic valve mean gradient measures 23.0 mmHg. Aortic valve peak gradient measures 40.2 mmHg. Aortic valve area, by VTI measures 1.05 cm. Pulmonic Valve: The pulmonic valve was grossly normal. Pulmonic valve regurgitation is mild to moderate. No evidence of pulmonic stenosis. Aorta: The aortic root and ascending aorta are structurally normal, with no evidence of dilitation. Venous: The right lower pulmonary vein  is normal. The inferior vena cava was not well visualized. IAS/Shunts: The atrial septum is grossly normal.  LEFT VENTRICLE PLAX 2D LVIDd:         4.80 cm LVIDs:         3.10 cm LV PW:         1.10 cm LV IVS:        0.90 cm LVOT diam:     2.10 cm LV SV:         76 LV SV Index:   45 LVOT Area:     3.46 cm  RIGHT VENTRICLE RV S prime:     9.79 cm/s TAPSE (M-mode): 1.7 cm LEFT ATRIUM             Index        RIGHT ATRIUM           Index LA diam:        4.40 cm 2.60 cm/m   RA Area:     19.90 cm LA Vol (A2C):   67.7 ml 40.01 ml/m  RA Volume:   51.60 ml  30.49 ml/m LA Vol (A4C):   82.9 ml 48.99 ml/m LA Biplane Vol: 78.5 ml 46.39 ml/m  AORTIC VALVE AV Area (Vmax):    1.01 cm AV Area (Vmean):   0.95 cm AV Area (VTI):     1.05 cm AV Vmax:           317.00 cm/s AV Vmean:          224.000 cm/s AV VTI:            0.725 m AV Peak Grad:      40.2 mmHg AV Mean Grad:      23.0 mmHg LVOT Vmax:         92.00 cm/s LVOT Vmean:        61.600 cm/s LVOT VTI:          0.219 m LVOT/AV VTI ratio: 0.30  AORTA Ao Asc diam: 3.10 cm MITRAL VALVE               TRICUSPID VALVE MV Area (PHT): 2.93 cm    TR Peak grad:   39.9 mmHg MV Decel Time: 259 msec    TR Vmax:        316.00 cm/s MR Peak grad: 93.3 mmHg MR Mean grad: 60.0 mmHg    SHUNTS MR Vmax:      483.00 cm/s  Systemic VTI:  0.22 m  MR Vmean:     359.0 cm/s   Systemic Diam: 2.10 cm MV E velocity: 98.10 cm/s MV A velocity: 52.80 cm/s MV E/A ratio:  1.86 Eleonore Chiquito MD Electronically signed by Eleonore Chiquito MD Signature Date/Time: 02/09/2021/6:17:11 PM    Final         Principal Problem:   Upper GI bleed Active Problems:   Benign essential hypertension   Hyperlipemia   Coronary artery disease involving native coronary artery of native heart without angina pectoris   Gastroesophageal reflux disease with esophagitis   Hypokalemia   Acute renal failure superimposed on stage 3 chronic kidney disease (HCC)   Prolonged QT interval     LOS: 1 day   Tye Savoy ,NP 02/10/2021, 9:31 AM

## 2021-02-10 NOTE — Progress Notes (Signed)
Initial Nutrition Assessment  DOCUMENTATION CODES:   Non-severe (moderate) malnutrition in context of chronic illness  INTERVENTION:  - Ensure Enlive po BID, each supplement provides 350 kcal and 20 grams of protein  NUTRITION DIAGNOSIS:   Moderate Malnutrition related to chronic illness (CKD III, GI bleed) as evidenced by percent weight loss, moderate fat depletion, severe muscle depletion.  GOAL:   Patient will meet greater than or equal to 90% of their needs  MONITOR:   PO intake, Supplement acceptance, Labs, Weight trends  REASON FOR ASSESSMENT:   Malnutrition Screening Tool    ASSESSMENT:   Pt admitted from home d/t vomiting secondary to upper GI bleed. PMH includes CKD III, CABGx4, CVA, GERD, HTN, prostate cancer, upper GI bleeds and acute blood loss anemia d/t LA grade D esophagitis.  11/23: CT abdomen: large hiatal hernia  Pt reports for the past 3 days not eating much as he has been on a clear liquid diet. PTA he reports having eaten 2 good meals usually consisting of bacon and eggs for breakfast, roast beef sandwich with pie and potato chips for lunch and nothing for dinner or snacks. His diet usually limits chicken and cheese. He states that when he was admitted, he was experiencing emesis and noted that he'd just had his COVID vaccine prior to that. He endorses difficulty swallowing from time to time on foods that may be more dry or crunchier. Spoke with MD about SLP consult. Pt is familiar with nutrition supplements and is willing to receive them during admission.   He states that his usual weight is 170 lbs and endorses a weight loss of 35 lbs within the last couple weeks but is not sure why except just not having as much of an appetite. Per review of chart, noted 11% weight loss within the past 3 months. This is significant for time frame.   Medications: protonix, miralax, KCl  Labs: potassium 3.4, BUN 28, Cr 1.34, AST 94  NUTRITION - FOCUSED PHYSICAL  EXAM:  Flowsheet Row Most Recent Value  Orbital Region Moderate depletion  Upper Arm Region Severe depletion  Thoracic and Lumbar Region Moderate depletion  Buccal Region Moderate depletion  Temple Region Moderate depletion  Clavicle Bone Region Severe depletion  Clavicle and Acromion Bone Region Moderate depletion  Scapular Bone Region Severe depletion  Dorsal Hand Moderate depletion  Patellar Region Severe depletion  Anterior Thigh Region Severe depletion  Posterior Calf Region Moderate depletion  Edema (RD Assessment) Mild  Hair Reviewed  Eyes Other (Comment)  [L eye pink and drooping]  Mouth Reviewed  Skin Reviewed  Nails Reviewed       Diet Order:   Diet Order             DIET SOFT Room service appropriate? Yes; Fluid consistency: Thin  Diet effective now                   EDUCATION NEEDS:   Education needs have been addressed  Skin:  Skin Assessment: Reviewed RN Assessment  Last BM:  02/08/21  Height:   Ht Readings from Last 1 Encounters:  02/08/21 5\' 6"  (1.676 m)    Weight:   Wt Readings from Last 1 Encounters:  02/08/21 61.2 kg    BMI:  Body mass index is 21.79 kg/m.  Estimated Nutritional Needs:   Kcal:  1700-1900  Protein:  85-95g  Fluid:  >1.7L  Clayborne Dana, RDN, LDN Clinical Nutrition

## 2021-02-10 NOTE — Evaluation (Signed)
Physical Therapy Evaluation Patient Details Name: Jon Reid MRN: [id number] DOB: Jul 30, 1928 Today's Date: 02/10/2021  History of Present Illness  87 yo male presents to [facility] on 11/23 with hematemesis, elevated troponins cards following and no plan for heart cath (suspect demand ischemia). 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 PMH includes R hip fracture s/p hemiarthroplasty 10/2020,  upper GIB and ABLA due to esophagitis, stage III chronic kidney disease, coronary artery disease with history of first-degree block, GERD, hypertension, history of prostate cancer.  Clinical Impression   Pt presents with generalized weakness, impaired balance with history of falls, impaired gait, and decreased activity tolerance vs baseline. Pt to benefit from acute PT to address deficits. Pt ambulated hallway distance with RW, overall requiring close guard to light physical assist. PT recommending pt continue with OPPT. PT to progress mobility as tolerated, and will continue to follow acutely.         Recommendations for follow up therapy are one component of a multi-disciplinary discharge planning process, led by the attending physician.  Recommendations may be updated based on patient status, additional functional criteria and insurance authorization.  Follow Up Recommendations Outpatient PT    Assistance Recommended at Discharge Intermittent Supervision/Assistance  Functional Status Assessment Patient has had a recent decline in their functional status and demonstrates the ability to make significant improvements in function in a reasonable and predictable amount of time.  Equipment Recommendations  None recommended by PT    Recommendations for Other Services       Precautions / Restrictions Precautions Precautions: Fall Restrictions Weight Bearing Restrictions: No       Mobility  Bed Mobility Overal bed mobility: Needs Assistance Bed Mobility: Supine to Sit;Sit to Supine     Supine to sit: Min guard;HOB elevated Sit to supine: Min assist;HOB elevated   General bed mobility comments: assist for return to supine for LE lifting into bed, boost up with use of boost function and bed pads.    Transfers Overall transfer level: Needs assistance Equipment used: Rolling walker (2 wheels) Transfers: Sit to/from Stand Sit to Stand: Min assist;From elevated surface           General transfer comment: light steadying assist and correcting posterior bias.    Ambulation/Gait Ambulation/Gait assistance: Min guard Gait Distance (Feet): 150 Feet Assistive device: Rolling walker (2 wheels) Gait Pattern/deviations: Step-through pattern;Decreased stride length;Trunk flexed;Shuffle Gait velocity: decr     General Gait Details: cues for upright posture, placement in RW. Pt with periods of shuffling steps, especially unsteady with turning but no overt LOB.  Stairs            Wheelchair Mobility    Modified Rankin (Stroke Patients Only)       Balance Overall balance assessment: Needs assistance;History of Falls Sitting-balance support: No upper extremity supported;Feet supported Sitting balance-Leahy Scale: Fair     Standing balance support: Bilateral upper extremity supported;During functional activity Standing balance-Leahy Scale: Poor                               Pertinent Vitals/Pain Pain Assessment: No/denies pain    Home Living                          Prior Function  Hand Dominance        Extremity/Trunk Assessment   Upper Extremity Assessment Upper Extremity Assessment: Defer to OT evaluation    Lower Extremity Assessment Lower Extremity Assessment: Generalized weakness    Cervical / Trunk Assessment Cervical / Trunk Assessment: Kyphotic  Communication       Cognition Arousal/Alertness: Awake/alert Behavior During Therapy: WFL for tasks assessed/performed Overall Cognitive Status: Within Functional Limits for tasks assessed                                          General Comments      Exercises     Assessment/Plan    PT Assessment Patient needs continued PT services  PT Problem List Decreased strength;Decreased mobility;Decreased activity tolerance;Decreased balance;Decreased knowledge of use of DME;Decreased safety awareness;Cardiopulmonary status limiting activity;Decreased knowledge of precautions       PT Treatment Interventions DME instruction;Therapeutic activities;Gait training;Therapeutic exercise;Patient/family education;Balance training;Stair training;Functional mobility training;Neuromuscular re-education    PT Goals (Current goals can be found in the Care Plan section)  Acute Rehab PT Goals Patient Stated Goal: home PT Goal Formulation: With patient Time For Goal Achievement: 02/24/21 Potential to Achieve Goals: Good    Frequency Min 3X/week   Barriers to discharge        Co-evaluation               AM-PAC PT "6 Clicks" Mobility  Outcome Measure Help needed turning from your back to your side while in a flat bed without using bedrails?: A Little Help needed moving from lying on your back to sitting on the side of a flat bed without using bedrails?: A Little Help needed moving to and from a bed to a chair (including a wheelchair)?: A Little Help needed standing up from a chair using your arms (e.g., wheelchair or bedside chair)?: A Little Help needed to walk in hospital room?: A Little Help needed climbing 3-5 steps with a railing? : A Little 6 Click Score: 18    End of Session Equipment Utilized During Treatment: Gait belt Activity Tolerance: Patient tolerated treatment well Patient left: in bed;with call bell/phone within reach;with bed alarm set;with nursing/sitter in room Nurse  Communication: Mobility status;Other (comment) (recommending SLP consult for aspiration risk) PT Visit Diagnosis: Other abnormalities of gait and mobility (R26.89);Muscle weakness (generalized) (M62.81)    Time: 1020-1045 PT Time Calculation (min) (ACUTE ONLY): 25 min   Charges:   PT Evaluation $PT Eval Low Complexity: 1 Low PT Treatments $Gait Training: 8-22 mins      Stacie Glaze, PT DPT Acute Rehabilitation Services Pager [phone]  Office [phone]   [place] 02/10/2021, 10:58 AM

## 2021-02-11 DIAGNOSIS — K2100 Gastro-esophageal reflux disease with esophagitis, without bleeding: Secondary | ICD-10-CM

## 2021-02-11 DIAGNOSIS — K922 Gastrointestinal hemorrhage, unspecified: Secondary | ICD-10-CM | POA: Diagnosis not present

## 2021-02-11 DIAGNOSIS — R112 Nausea with vomiting, unspecified: Secondary | ICD-10-CM | POA: Diagnosis not present

## 2021-02-11 LAB — CBC WITH DIFFERENTIAL/PLATELET
Abs Immature Granulocytes: 0.02 10*3/uL (ref 0.00–0.07)
Basophils Absolute: 0.1 10*3/uL (ref 0.0–0.1)
Basophils Relative: 1 %
Eosinophils Absolute: 0.1 10*3/uL (ref 0.0–0.5)
Eosinophils Relative: 2 %
HCT: 31.1 % — ABNORMAL LOW (ref 39.0–52.0)
Hemoglobin: 9.9 g/dL — ABNORMAL LOW (ref 13.0–17.0)
Immature Granulocytes: 0 %
Lymphocytes Relative: 22 %
Lymphs Abs: 1.5 10*3/uL (ref 0.7–4.0)
MCH: 30.7 pg (ref 26.0–34.0)
MCHC: 31.8 g/dL (ref 30.0–36.0)
MCV: 96.3 fL (ref 80.0–100.0)
Monocytes Absolute: 0.6 10*3/uL (ref 0.1–1.0)
Monocytes Relative: 10 %
Neutro Abs: 4.4 10*3/uL (ref 1.7–7.7)
Neutrophils Relative %: 65 %
Platelets: 227 10*3/uL (ref 150–400)
RBC: 3.23 MIL/uL — ABNORMAL LOW (ref 4.22–5.81)
RDW: 17.5 % — ABNORMAL HIGH (ref 11.5–15.5)
WBC: 6.7 10*3/uL (ref 4.0–10.5)
nRBC: 0 % (ref 0.0–0.2)

## 2021-02-11 LAB — COMPREHENSIVE METABOLIC PANEL
ALT: 23 U/L (ref 0–44)
AST: 58 U/L — ABNORMAL HIGH (ref 15–41)
Albumin: 2.5 g/dL — ABNORMAL LOW (ref 3.5–5.0)
Alkaline Phosphatase: 71 U/L (ref 38–126)
Anion gap: 5 (ref 5–15)
BUN: 25 mg/dL — ABNORMAL HIGH (ref 8–23)
CO2: 25 mmol/L (ref 22–32)
Calcium: 8.1 mg/dL — ABNORMAL LOW (ref 8.9–10.3)
Chloride: 107 mmol/L (ref 98–111)
Creatinine, Ser: 1.53 mg/dL — ABNORMAL HIGH (ref 0.61–1.24)
GFR, Estimated: 43 mL/min — ABNORMAL LOW (ref >60)
Glucose, Bld: 112 mg/dL — ABNORMAL HIGH (ref 70–99)
Potassium: 3.8 mmol/L (ref 3.5–5.1)
Sodium: 137 mmol/L (ref 135–145)
Total Bilirubin: 0.7 mg/dL (ref 0.3–1.2)
Total Protein: 5.1 g/dL — ABNORMAL LOW (ref 6.5–8.1)

## 2021-02-11 LAB — MAGNESIUM: Magnesium: 1.9 mg/dL (ref 1.7–2.4)

## 2021-02-11 MED ORDER — PANTOPRAZOLE SODIUM 40 MG PO TBEC: 40.0000 mg | DELAYED_RELEASE_TABLET | Freq: Two times a day (BID) | ORAL | 0 refills | Status: AC

## 2021-02-11 NOTE — Discharge Summary (Signed)
Jon Reid XTG:[id number] DOB: June 19, 1928 DOA: 02/08/2021  PCP: Jon Fraise, MD  Admit date: 02/08/2021  Discharge date: 02/11/2021  Admitted From: Home   Disposition:  Home   Recommendations for Outpatient Follow-up:   Follow up with PCP in 1-2 weeks  PCP Please obtain BMP/CBC, 2 view CXR in 1week,  (see Discharge instructions)   PCP Please follow up on the following pending results:    Home Health: PT   Equipment/Devices: None  Consultations: GI, Cards Discharge Condition: Stable    CODE STATUS: Full    Diet Recommendation: Heart Healthy   Diet Order             Diet - low sodium heart healthy           DIET SOFT Room service appropriate? Yes; Fluid consistency: Thin  Diet effective now                    CC - N&V    Brief history of present illness from the day of admission and additional interim summary    Jon Reid is a 87 y.o. male with medical history significant of CKD stage III, CAD s/p CABGx4, hx of CVA, hx of first degree heart block, GERD, HTN, hx of prostate cancer.  He also has a history of upper GI bleeds and acute blood loss anemia due to LA grade D esophagitis-last EGD in 09/2020 who presented  to [facility] with one night of vomiting, black colored vomit with some dark stools.  Came to the ER where his Hemoccult stools interestingly were negative, BUN was unremarkable, he was found to have mild AKI, he never had chest pain but a troponin was checked in the ER which was high he was admitted for further work-up.                                                                 Hospital Course   Nausea vomiting with dark vomit and stool - he has history of esophagitis with previous upper GI bleed, currently BUN is stable, H&H has dropped mildly when accounted for heme dilution, he  got 1 unit of packed RBC this admission, stable H&H and no further signs of ongoing bleeding, seen by GI, no further intervention he has known history of hiatal hernia with esophagitis and this was thought to be due to him laying flat at home, he has been counseled to use 3 pillows at all times at home and eat food sitting up in the chair.  Twice daily PPI follow-up with outpatient PCP and his primary gastroenterologist within a week.  Okay to resume aspirin per GI today.   2.  AKI on CKD 3A.  Baseline creatinine 1.2, AKI due to nausea vomiting dehydration, improved after IV fluids PCP  to monitor.   3.  Acute on chronic anemia, he has chronic anemia some anemia can be explained by him dilution and there could be some upper GI blood loss as well.  See #1 above.   4.  CAD s/p CABG -he is chest pain-free, EKG has nonspecific changes but likely chronic, troponin noted echo pending, poor candidate for aspirin or Plavix due to upper GI bleed.  Blood pressure too low for beta-blocker, has statin allergy hence no statin, was seen by cardiology team.  No further interventions follow with primary cardiologist after a few days of discharge completely symptom-free.   5.  Hypotension.  Stable after initial hydration.  Follow with primary cardiologist to see if blood pressure will tolerate addition of beta-blocker at a later date.   6.  Hypokalemia.  Replaced.   7. Chr. L conjunctival irritation -supportive care.   Discharge diagnosis     Principal Problem:   Upper GI bleed Active Problems:   Benign essential hypertension   Hyperlipemia   Coronary artery disease involving native coronary artery of native heart without angina pectoris   Gastroesophageal reflux disease with esophagitis   Hypokalemia   Acute renal failure superimposed on stage 3 chronic kidney disease (HCC)   Prolonged QT interval    Discharge instructions    Discharge Instructions     Diet - low sodium heart healthy   Complete by:  As directed    Discharge instructions   Complete by: As directed    Sleep with at least 3 pillows under your head, do not lay flat as you have severe hiatal hernia and will cause esophagitis, nausea vomiting and GI bleed.  Follow with Primary MD Jon Fraise, MD along with your cardiologist and gastroenterologist in 7 days   Get CBC, CMP, 2 view Chest X ray -  checked next visit within 1 week by Primary MD   Activity: As tolerated with Full fall precautions use walker/cane & assistance as needed  Disposition Home   Diet: Heart Healthy with 1.5 L fluid restriction per day  Special Instructions: If you have smoked or chewed Tobacco  in the last 2 yrs please stop smoking, stop any regular Alcohol  and or any Recreational drug use.  On your next visit with your primary care physician please Get Medicines reviewed and adjusted.  Please request your Prim.MD to go over all Hospital Tests and Procedure/Radiological results at the follow up, please get all Hospital records sent to your Prim MD by signing hospital release before you go home.  If you experience worsening of your admission symptoms, develop shortness of breath, life threatening emergency, suicidal or homicidal thoughts you must seek medical attention immediately by calling 911 or calling your MD immediately  if symptoms less severe.  You Must read complete instructions/literature along with all the possible adverse reactions/side effects for all the Medicines you take and that have been prescribed to you. Take any new Medicines after you have completely understood and accpet all the possible adverse reactions/side effects.   Increase activity slowly   Complete by: As directed        Discharge Medications   Allergies as of 02/11/2021       Reactions   Crestor [rosuvastatin] Swelling   GYNECOMASTIA    Trazodone And Nefazodone    Hallucinations        Medication List     STOP taking these medications    SM Tussin  DM 10-100 MG/5ML liquid Generic drug: Dextromethorphan-guaiFENesin  TAKE these medications    ascorbic acid 500 MG tablet Commonly known as: VITAMIN C Take 1 tablet (500 mg total) by mouth daily.   Aspirin Low Dose 81 MG EC tablet Generic drug: aspirin Take 1 tablet (81 mg total) by mouth daily. Swallow whole.   carbidopa-levodopa 25-100 MG tablet Commonly known as: SINEMET IR Take 1.5 tablets by mouth 3 (three) times daily. What changed: when to take this   CertaVite/Antioxidants Tabs Take 1 tablet by mouth daily.   docusate sodium 100 MG capsule Commonly known as: COLACE Take 1 capsule (100 mg total) by mouth 2 (two) times daily.   fluticasone 50 MCG/ACT nasal spray Commonly known as: FLONASE One to 2 sprays each nostril at bedtime What changed:  how much to take how to take this when to take this reasons to take this additional instructions   furosemide 40 MG tablet Commonly known as: LASIX Take 1 tablet (40 mg total) by mouth 2 (two) times daily. One at breakfast. The other with lunch What changed:  when to take this additional instructions   guaiFENesin 600 MG 12 hr tablet Commonly known as: MUCINEX Take 600 mg by mouth 2 (two) times daily as needed for cough.   Iron (Ferrous Sulfate) 325 (65 Fe) MG Tabs Take 1 tablet by mouth daily. What changed: how much to take   melatonin 5 MG Tabs Take 1 tablet (5 mg total) by mouth at bedtime.   nitroGLYCERIN 0.4 MG SL tablet Commonly known as: NITROSTAT Place 1 tablet (0.4 mg total) under the tongue every 5 (five) minutes as needed for chest pain.   pantoprazole 40 MG tablet Commonly known as: PROTONIX Take 1 tablet (40 mg total) by mouth 2 (two) times daily.   potassium chloride 10 MEQ tablet Commonly known as: KLOR-CON Take 2 tablets (20 mEq total) by mouth daily. For potassium replacement/ supplement   Senexon-S 8.6-50 MG tablet Generic drug: senna-docusate Take 2 tablets by mouth daily with  supper.   triamcinolone cream 0.1 % Commonly known as: KENALOG Apply 1 application topically daily as needed (itching).   Vitamin D 50 MCG (2000 UT) Caps Take 2,000 Units by mouth daily.         Follow-up Information     Stacks, [name], MD. Schedule an appointment as soon as possible for a visit in 1 week(s).   Specialty: Family Medicine Why: Also follow-up with your gastroenterologist within a week Contact information: [employer] [place] [address] [phone]         Minus Breeding, MD. Schedule an appointment as soon as possible for a visit in 1 week(s).   Specialty: Cardiology Contact information: [address] [place] [address] [phone]                 Major procedures and Radiology Reports - PLEASE review detailed and final reports thoroughly  -       CT ABDOMEN PELVIS W CONTRAST  Result Date: 02/08/2021 CLINICAL DATA:  Nausea and vomiting. EXAM: CT ABDOMEN AND PELVIS WITH CONTRAST TECHNIQUE: Multidetector CT imaging of the abdomen and pelvis was performed using the standard protocol following bolus administration of intravenous contrast. CONTRAST:  79mL OMNIPAQUE IOHEXOL 300 MG/ML  SOLN COMPARISON:  10/19/2019. FINDINGS: Lower chest: Lung bases show volume loss in both lower lobes. Very large hiatal hernia. Atherosclerotic calcification of the aorta and aortic valve. Heart is at the upper limits of normal in size. No pericardial or pleural effusion. Hepatobiliary: Liver  and gallbladder are unremarkable. No biliary ductal dilatation. Pancreas: Negative. Spleen: Negative. Adrenals/Urinary Tract: Adrenal glands are unremarkable. Probable small right renal sinus cyst. Additional low-attenuation lesions in the kidneys measure up to 2.8 cm off the lower pole right kidney and are likely cysts. Ureters are decompressed. Bladder is largely obscured by streak artifact from a right hip arthroplasty. Stomach/Bowel: Large hiatal hernia. Stomach,  small bowel, appendix and majority of the colon are unremarkable. Fair amount of stool is seen in the descending and rectosigmoid colon. Vascular/Lymphatic: Atherosclerotic calcification of the aorta. No pathologically enlarged lymph nodes. Reproductive: Obscured by streak artifact from a right hip arthroplasty. Other: No free fluid. Fluid density structure in the right paracolic gutter measures 4.3 x 6.5 cm (3/51), slightly enlarged from 3.1 x 4.6 cm on 10/19/2019. Mesenteries and peritoneum are otherwise unremarkable. Musculoskeletal: Right hip arthroplasty. Degenerative changes in the spine. Grade 1 anterolisthesis of L4 on L5. L3 inferior endplate compression fracture is old. IMPRESSION: 1. No acute findings. 2. Large hiatal hernia. 3. Fluid density structure in the right paracolic gutter has enlarged in the interval and may represent a lymphangioma or GI duplication cyst. 4.  Aortic atherosclerosis (ICD10-I70.0). Electronically Signed   By: Lorin Picket M.D.   On: 02/08/2021 15:39   DG Chest Port 1 View  Result Date: 02/09/2021 CLINICAL DATA:  Shortness of breath EXAM: PORTABLE CHEST 1 VIEW COMPARISON:  And chest x-ray 11/24/2020 FINDINGS: Heart is enlarged. Mediastinum appears stable. Calcified plaques in the aortic arch. Cardiac surgical changes and median sternotomy wires. Large hiatal hernia eccentric to the right again seen. No new consolidation identified. Opacities at the left costophrenic angle similar to previous study consistent with small effusion and atelectasis. No pneumothorax. IMPRESSION: 1. Cardiomegaly. 2. Small left pleural effusion with associated atelectasis. 3. Large hiatal hernia. Electronically Signed   By: Ofilia Neas M.D.   On: 02/09/2021 09:45   ECHOCARDIOGRAM COMPLETE  Result Date: 02/09/2021    ECHOCARDIOGRAM REPORT   Patient Name:   Jon Reid Date of Exam: 02/09/2021 Medical Rec #:  [id number]      Height:       66.0 in Accession #:    [id number]     Weight:        135.0 lb Date of Birth:  12/20/1928      BSA:          1.692 m Patient Age:    87 years       BP:           93/51 mmHg Patient Gender: M              HR:           64 bpm. Exam Location:  Inpatient Procedure: 2D Echo Indications:    NSTEMI  History:        Patient has prior history of Echocardiogram examinations, most                 recent 06/26/2020. Prior CABG, chronic kidney disease.; Risk                 Factors:Hypertension, Dyslipidemia and Sleep Apnea.  Sonographer:    Johny Chess RDCS Referring Phys: [id number] [place]  1. Left ventricular ejection fraction, by estimation, is 55 to 60%. The left ventricle has normal function. The left ventricle has no regional wall motion abnormalities. Left ventricular diastolic parameters are consistent with Grade II diastolic dysfunction (pseudonormalization).  2. Right ventricular systolic function  is normal. The right ventricular size is normal.  3. Left atrial size was severely dilated.  4. The mitral valve is degenerative. Mild to moderate mitral valve regurgitation. No evidence of mitral stenosis.  5. The aortic valve is tricuspid. There is moderate calcification of the aortic valve. There is moderate thickening of the aortic valve. Aortic valve regurgitation is trivial. Moderate aortic valve stenosis. Aortic valve area, by VTI measures 1.05 cm. Aortic valve mean gradient measures 23.0 mmHg. Aortic valve Vmax measures 3.17 m/s. Comparison(s): No significant change from prior study. Distal septal hypokinesis commented on the prior study. Appears to be postop septum in setting of CABG. AS remains moderate. FINDINGS  Left Ventricle: Left ventricular ejection fraction, by estimation, is 55 to 60%. The left ventricle has normal function. The left ventricle has no regional wall motion abnormalities. The left ventricular internal cavity size was normal in size. There is  no left ventricular hypertrophy. Abnormal (paradoxical) septal motion  consistent with post-operative status. Left ventricular diastolic parameters are consistent with Grade II diastolic dysfunction (pseudonormalization). Right Ventricle: The right ventricular size is normal. No increase in right ventricular wall thickness. Right ventricular systolic function is normal. Left Atrium: Left atrial size was severely dilated. Right Atrium: Right atrial size was normal in size. Pericardium: There is no evidence of pericardial effusion. Mitral Valve: The mitral valve is degenerative in appearance. Mild to moderate mitral annular calcification. Mild to moderate mitral valve regurgitation. No evidence of mitral valve stenosis. Tricuspid Valve: The tricuspid valve is grossly normal. Tricuspid valve regurgitation is mild . No evidence of tricuspid stenosis. Aortic Valve: The aortic valve is tricuspid. There is moderate calcification of the aortic valve. There is moderate thickening of the aortic valve. Aortic valve regurgitation is trivial. Moderate aortic stenosis is present. Aortic valve mean gradient measures 23.0 mmHg. Aortic valve peak gradient measures 40.2 mmHg. Aortic valve area, by VTI measures 1.05 cm. Pulmonic Valve: The pulmonic valve was grossly normal. Pulmonic valve regurgitation is mild to moderate. No evidence of pulmonic stenosis. Aorta: The aortic root and ascending aorta are structurally normal, with no evidence of dilitation. Venous: The right lower pulmonary vein is normal. The inferior vena cava was not well visualized. IAS/Shunts: The atrial septum is grossly normal.  LEFT VENTRICLE PLAX 2D LVIDd:         4.80 cm LVIDs:         3.10 cm LV PW:         1.10 cm LV IVS:        0.90 cm LVOT diam:     2.10 cm LV SV:         76 LV SV Index:   45 LVOT Area:     3.46 cm  RIGHT VENTRICLE RV S prime:     9.79 cm/s TAPSE (M-mode): 1.7 cm LEFT ATRIUM             Index        RIGHT ATRIUM           Index LA diam:        4.40 cm 2.60 cm/m   RA Area:     19.90 cm LA Vol (A2C):   67.7  ml 40.01 ml/m  RA Volume:   51.60 ml  30.49 ml/m LA Vol (A4C):   82.9 ml 48.99 ml/m LA Biplane Vol: 78.5 ml 46.39 ml/m  AORTIC VALVE AV Area (Vmax):    1.01 cm AV Area (Vmean):   0.95 cm AV Area (VTI):  1.05 cm AV Vmax:           317.00 cm/s AV Vmean:          224.000 cm/s AV VTI:            0.725 m AV Peak Grad:      40.2 mmHg AV Mean Grad:      23.0 mmHg LVOT Vmax:         92.00 cm/s LVOT Vmean:        61.600 cm/s LVOT VTI:          0.219 m LVOT/AV VTI ratio: 0.30  AORTA Ao Asc diam: 3.10 cm MITRAL VALVE               TRICUSPID VALVE MV Area (PHT): 2.93 cm    TR Peak grad:   39.9 mmHg MV Decel Time: 259 msec    TR Vmax:        316.00 cm/s MR Peak grad: 93.3 mmHg MR Mean grad: 60.0 mmHg    SHUNTS MR Vmax:      483.00 cm/s  Systemic VTI:  0.22 m MR Vmean:     359.0 cm/s   Systemic Diam: 2.10 cm MV E velocity: 98.10 cm/s MV A velocity: 52.80 cm/s MV E/A ratio:  1.86 Eleonore Chiquito MD Electronically signed by Eleonore Chiquito MD Signature Date/Time: 02/09/2021/6:17:11 PM    Final       Today   Subjective    Jon Reid today has no headache,no chest abdominal pain,no new weakness tingling or numbness, feels much better wants to go home today.     Objective   Blood pressure 124/69, pulse 61, temperature 98 F (36.7 C), temperature source Oral, resp. rate 19, height 5\' 6"  (1.676 m), weight 61.2 kg, SpO2 96 %.   Intake/Output Summary (Last 24 hours) at 02/11/2021 1112 Last data filed at 02/11/2021 0500 Gross per 24 hour  Intake 60 ml  Output 1950 ml  Net -1890 ml    Exam  Awake Alert, No new F.N deficits, Normal affect [place].AT,PERRAL Supple Neck,No JVD, No cervical lymphadenopathy appriciated.  Symmetrical Chest wall movement, Good air movement bilaterally, CTAB RRR,No Gallops,Rubs or new Murmurs, No Parasternal Heave +ve B.Sounds, Abd Soft, Non tender, No organomegaly appriciated, No rebound -guarding or rigidity. No Cyanosis, Clubbing or edema, No new Rash or bruise   Data  Review   CBC w Diff:  Lab Results  Component Value Date   WBC 6.7 02/11/2021   HGB 9.9 (L) 02/11/2021   HGB 11.1 (L) 02/01/2021   HGB 13.5 04/17/2012   HCT 31.1 (L) 02/11/2021   HCT 33.6 (L) 02/01/2021   HCT 39.2 04/17/2012   PLT 227 02/11/2021   PLT 282 02/01/2021   LYMPHOPCT 22 02/11/2021   LYMPHOPCT 23.8 04/17/2012   MONOPCT 10 02/11/2021   MONOPCT 11.9 04/17/2012   EOSPCT 2 02/11/2021   EOSPCT 0.6 04/17/2012   BASOPCT 1 02/11/2021   BASOPCT 0.5 04/17/2012    CMP:  Lab Results  Component Value Date   NA 137 02/11/2021   NA 147 (H) 02/01/2021   NA 140 04/17/2012   K 3.8 02/11/2021   K 4.1 04/17/2012   CL 107 02/11/2021   CL 105 04/17/2012   CO2 25 02/11/2021   CO2 25 04/17/2012   BUN 25 (H) 02/11/2021   BUN 26 02/01/2021   BUN 23.9 04/17/2012   CREATININE 1.53 (H) 02/11/2021   CREATININE 1.41 (H) 02/22/2020   CREATININE 1.53 (H) 11/06/2012  CREATININE 1.5 (H) 04/17/2012   PROT 5.1 (L) 02/11/2021   PROT 7.2 02/01/2021   PROT 7.3 04/17/2012   ALBUMIN 2.5 (L) 02/11/2021   ALBUMIN 4.5 02/01/2021   ALBUMIN 4.4 04/17/2012   BILITOT 0.7 02/11/2021   BILITOT 0.4 02/01/2021   BILITOT 0.8 02/22/2020   BILITOT 0.97 04/17/2012   ALKPHOS 71 02/11/2021   ALKPHOS 89 04/17/2012   AST 58 (H) 02/11/2021   AST 24 02/22/2020   AST 25 04/17/2012   ALT 23 02/11/2021   ALT 16 02/22/2020   ALT 12 04/17/2012  .   Total Time in preparing paper work, data evaluation and todays exam - 10 minutes  Lala Lund M.D on 02/11/2021 at 11:12 AM  [facility]

## 2021-02-11 NOTE — TOC Transition Note (Signed)
Transition of Care [facility]) - CM/SW Discharge Note   Patient Details  Name: Jon Reid MRN: [id number] Date of Birth: [birth year]  Transition of Care [facility]) CM/SW Contact:  Bartholomew Crews, RN Phone Number: [phone] 02/11/2021, 11:38 AM   Clinical Narrative:     Patient to transition home today. Spoke with patient on hospital room phone. Patient is agreeable to continuing home health PT with [facility]. Confirmed patient is active for PT. Advised of transition home today and [place] orders in place for resumption of services. Patient stated that he will call his son to pick him up. No further TOC needs identified at this time.   Final next level of care: [place] Barriers to Discharge: No Barriers Identified   Patient Goals and CMS Choice Patient states their goals for this hospitalization and ongoing recovery are:: return home CMS Medicare.gov Compare Post Acute Care list provided to:: Patient Choice offered to / list presented to : Patient  Discharge Placement                       Discharge Plan and Services                DME Arranged: N/A DME Agency: NA       HH Arranged: PT [place] Agency: [place] ([place]) Date [place]: 02/11/21 Time [place]: 1138 Representative spoke with at [place]: [place] ([place]) Interventions     Readmission Risk Interventions No flowsheet data found.

## 2021-02-11 NOTE — Plan of Care (Signed)

## 2021-02-11 NOTE — Progress Notes (Addendum)
Progress Note Hospital Day: 4  Chief Complaint:    GI bleed     ASSESSMENT AND PLAN   # 87 year old male followed by [facility] GI for history of upper GI bleed / large hiatal hernia / severe esophagitis / iron deficiency anemia in April 2022 . Also with large HH and candida esophagitis on follow up EGD July 2022. Transferred from [facility] 11/23 with recurrent upper dark stool on iron ( heme negative), and acute on chronic anemia.  Suspect recent bleed  secondary to esophagitis .  Cameron's erosions possible given large HH. PUD not excluded.   AVMs in setting of AS another possibility   --No evidence for GI bleeding in last 3 days --Hgb continues to improve post 1uPRBC , it is 9.9 this am.   --Continue BID PPI --No plans for repeat EGD. Yesterday we discussed importance of anti-reflux measures. . --Can resume daily baby ASA --Continue BID PPI  --There was fair amount of stool in colon on CT scan 11/23 . Started Miralax yesterday. 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 Fluid density structure in the right paracolic gutter has enlarged in the interval and may represent a lymphangioma or GI duplication cyst.   #AKI superimposed on CKD 3.  Bump in Creatinine overnight 1.34 >> 1.53   # Elevated BNP 1423  / elevated troponin ( 7.183 ). Hx of CAD / remote CABG. Cardiology evaluated, no evidence for ACS. Elevated troponin felt to be demand ischemia in setting of GI bleed with hypotension and AKI.  Echo this admission >>> EF 55-60%    SUBJECTIVE   No BM in three days. Feels okay.        OBJECTIVE      Scheduled inpatient medications:   sodium chloride   Intravenous Once   feeding supplement  237 mL Oral BID BM   furosemide  40 mg Intravenous Daily   pantoprazole (PROTONIX) IV  40 mg Intravenous Q12H   polyethylene glycol  17 g Oral Daily   tobramycin   Left Eye TID    Continuous inpatient infusions:  PRN inpatient medications: acetaminophen **OR** acetaminophen, prochlorperazine  Vital signs in last 24 hours: Temp:  [97.7 F (36.5 C)-98.3 F (36.8 C)] 98 F (36.7 C) (11/26 0737) Pulse Rate:  [56-72] 61 (11/26 0737) Resp:  [17-20] 19 (11/26 0737) BP: (84-124)/(53-69) 124/69 (11/26 0737) SpO2:  [93 %-97 %] 96 % (11/26 0737) Last BM Date: 02/08/21  Intake/Output Summary (Last 24 hours) at 02/11/2021 0851 Last data filed at 02/11/2021 0500 Gross per 24 hour  Intake 60 ml  Output 1950 ml  Net -1890 ml     Physical Exam:  General: Alert male in NAD Heart:  Regular rate and rhythm. Soft murmur. No lower extremity edema Pulmonary: Normal respiratory effort Abdomen: Soft, nondistended, nontender. Normal bowel sounds.  Neurologic: Alert and oriented Psych: Pleasant. Cooperative.   Filed Weights   02/08/21 1206  Weight: 61.2 kg    Intake/Output from previous day: 11/25 0701 - 11/26 0700 In: 60 [P.O.:60] Out: 1950 [Urine:1950] Intake/Output this shift: No intake/output data recorded.    Lab Results: Recent Labs    02/09/21 1948 02/10/21 0123 02/11/21 0813  WBC 8.5 7.3 6.7  HGB 8.7* 8.2* 9.9*  HCT 26.6* 25.3* 31.1*  PLT 213 206 227   BMET Recent Labs    02/09/21 1108 02/10/21 0123  02/11/21 0138  NA 140 139 137  K 3.6 3.4* 3.8  CL 111 109 107  CO2 24 25 25   GLUCOSE 132* 101* 112*  BUN 34* 28* 25*  CREATININE 1.34* 1.34* 1.53*  CALCIUM 8.3* 8.1* 8.1*   LFT Recent Labs    02/11/21 0138  PROT 5.1*  ALBUMIN 2.5*  AST 58*  ALT 23  ALKPHOS 71  BILITOT 0.7   PT/INR No results for input(s): LABPROT, INR in the last 72 hours. Hepatitis Panel No results for input(s): HEPBSAG, HCVAB, HEPAIGM, HEPBIGM in the last 72 hours.  DG Chest Port 1 View  Result Date: 02/09/2021 CLINICAL DATA:  Shortness of breath EXAM: PORTABLE CHEST 1 VIEW COMPARISON:  And chest x-ray 11/24/2020 FINDINGS: Heart is enlarged. Mediastinum  appears stable. Calcified plaques in the aortic arch. Cardiac surgical changes and median sternotomy wires. Large hiatal hernia eccentric to the right again seen. No new consolidation identified. Opacities at the left costophrenic angle similar to previous study consistent with small effusion and atelectasis. No pneumothorax. IMPRESSION: 1. Cardiomegaly. 2. Small left pleural effusion with associated atelectasis. 3. Large hiatal hernia. Electronically Signed   By: Ofilia Neas M.D.   On: 02/09/2021 09:45   ECHOCARDIOGRAM COMPLETE  Result Date: 02/09/2021    ECHOCARDIOGRAM REPORT   Patient Name:   Jon Reid Date of Exam: 02/09/2021 Medical Rec #:  [id number]      Height:       66.0 in Accession #:    [id number]     Weight:       135.0 lb Date of Birth:  1928/09/27      BSA:          1.692 m Patient Age:    38 years       BP:           93/51 mmHg Patient Gender: M              HR:           64 bpm. Exam Location:  Inpatient Procedure: 2D Echo Indications:    NSTEMI  History:        Patient has prior history of Echocardiogram examinations, most                 recent 06/26/2020. Prior CABG, chronic kidney disease.; Risk                 Factors:Hypertension, Dyslipidemia and Sleep Apnea.  Sonographer:    Johny Chess RDCS Referring Phys: [id number] [place]  1. Left ventricular ejection fraction, by estimation, is 55 to 60%. The left ventricle has normal function. The left ventricle has no regional wall motion abnormalities. Left ventricular diastolic parameters are consistent with Grade II diastolic dysfunction (pseudonormalization).  2. Right ventricular systolic function is normal. The right ventricular size is normal.  3. Left atrial size was severely dilated.  4. The mitral valve is degenerative. Mild to moderate mitral valve regurgitation. No evidence of mitral stenosis.  5. The aortic valve is tricuspid. There is moderate calcification of the aortic valve. There is moderate  thickening of the aortic valve. Aortic valve regurgitation is trivial. Moderate aortic valve stenosis. Aortic valve area, by VTI measures 1.05 cm. Aortic valve mean gradient measures 23.0 mmHg. Aortic valve Vmax measures 3.17 m/s. Comparison(s): No significant change from prior study. Distal septal hypokinesis commented on the prior study. Appears to be postop septum in setting of CABG. AS remains moderate. FINDINGS  Left  Ventricle: Left ventricular ejection fraction, by estimation, is 55 to 60%. The left ventricle has normal function. The left ventricle has no regional wall motion abnormalities. The left ventricular internal cavity size was normal in size. There is  no left ventricular hypertrophy. Abnormal (paradoxical) septal motion consistent with post-operative status. Left ventricular diastolic parameters are consistent with Grade II diastolic dysfunction (pseudonormalization). Right Ventricle: The right ventricular size is normal. No increase in right ventricular wall thickness. Right ventricular systolic function is normal. Left Atrium: Left atrial size was severely dilated. Right Atrium: Right atrial size was normal in size. Pericardium: There is no evidence of pericardial effusion. Mitral Valve: The mitral valve is degenerative in appearance. Mild to moderate mitral annular calcification. Mild to moderate mitral valve regurgitation. No evidence of mitral valve stenosis. Tricuspid Valve: The tricuspid valve is grossly normal. Tricuspid valve regurgitation is mild . No evidence of tricuspid stenosis. Aortic Valve: The aortic valve is tricuspid. There is moderate calcification of the aortic valve. There is moderate thickening of the aortic valve. Aortic valve regurgitation is trivial. Moderate aortic stenosis is present. Aortic valve mean gradient measures 23.0 mmHg. Aortic valve peak gradient measures 40.2 mmHg. Aortic valve area, by VTI measures 1.05 cm. Pulmonic Valve: The pulmonic valve was grossly  normal. Pulmonic valve regurgitation is mild to moderate. No evidence of pulmonic stenosis. Aorta: The aortic root and ascending aorta are structurally normal, with no evidence of dilitation. Venous: The right lower pulmonary vein is normal. The inferior vena cava was not well visualized. IAS/Shunts: The atrial septum is grossly normal.  LEFT VENTRICLE PLAX 2D LVIDd:         4.80 cm LVIDs:         3.10 cm LV PW:         1.10 cm LV IVS:        0.90 cm LVOT diam:     2.10 cm LV SV:         76 LV SV Index:   45 LVOT Area:     3.46 cm  RIGHT VENTRICLE RV S prime:     9.79 cm/s TAPSE (M-mode): 1.7 cm LEFT ATRIUM             Index        RIGHT ATRIUM           Index LA diam:        4.40 cm 2.60 cm/m   RA Area:     19.90 cm LA Vol (A2C):   67.7 ml 40.01 ml/m  RA Volume:   51.60 ml  30.49 ml/m LA Vol (A4C):   82.9 ml 48.99 ml/m LA Biplane Vol: 78.5 ml 46.39 ml/m  AORTIC VALVE AV Area (Vmax):    1.01 cm AV Area (Vmean):   0.95 cm AV Area (VTI):     1.05 cm AV Vmax:           317.00 cm/s AV Vmean:          224.000 cm/s AV VTI:            0.725 m AV Peak Grad:      40.2 mmHg AV Mean Grad:      23.0 mmHg LVOT Vmax:         92.00 cm/s LVOT Vmean:        61.600 cm/s LVOT VTI:          0.219 m LVOT/AV VTI ratio: 0.30  AORTA Ao Asc diam: 3.10 cm MITRAL VALVE  TRICUSPID VALVE MV Area (PHT): 2.93 cm    TR Peak grad:   39.9 mmHg MV Decel Time: 259 msec    TR Vmax:        316.00 cm/s MR Peak grad: 93.3 mmHg MR Mean grad: 60.0 mmHg    SHUNTS MR Vmax:      483.00 cm/s  Systemic VTI:  0.22 m MR Vmean:     359.0 cm/s   Systemic Diam: 2.10 cm MV E velocity: 98.10 cm/s MV A velocity: 52.80 cm/s MV E/A ratio:  1.86 Eleonore Chiquito MD Electronically signed by Eleonore Chiquito MD Signature Date/Time: 02/09/2021/6:17:11 PM    Final         Principal Problem:   Upper GI bleed Active Problems:   Benign essential hypertension   Hyperlipemia   Coronary artery disease involving native coronary artery of native heart  without angina pectoris   Gastroesophageal reflux disease with esophagitis   Hypokalemia   Acute renal failure superimposed on stage 3 chronic kidney disease (HCC)   Prolonged QT interval     LOS: 2 days   Tye Savoy ,NP 02/11/2021, 8:51 AM   Attending Attestation:   I have taken an interval history, reviewed the chart and examined the patient earlier today. No family was present at the time of my evaluation.  I agree with the Advanced Practitioner's note, impression and recommendations.    No overt or occult GI blood loss noted during this hospitalization.  Continue maximum supportive care include PPI BID, serial hgb/hct with transfusion as indicated, reflux lifestyle modifications, and considering evaluation for concurrent causes of anemia. No endoscopic evaluation planned at this time.  Agree with adding Miralax for constipation.  The inpatient GI team will move to stand-by. He can follow-up with his gastroenterologist at [facility] on discharge. Please call the on-call gastroenterologist with any additional questions or concerns during this hospitalization.      Thornton Park, MD, MPH [facility]

## 2021-02-11 NOTE — Progress Notes (Incomplete)
Progress Note  Patient Name: Jon Reid Date of Encounter: 02/11/2021  [facility] Cardiologist: Minus Breeding, MD ***  Subjective   ***  Inpatient Medications    Scheduled Meds:  sodium chloride   Intravenous Once   feeding supplement  237 mL Oral BID BM   furosemide  40 mg Intravenous Daily   pantoprazole (PROTONIX) IV  40 mg Intravenous Q12H   polyethylene glycol  17 g Oral Daily   tobramycin   Left Eye TID   Continuous Infusions:  PRN Meds: acetaminophen **OR** acetaminophen, prochlorperazine   Vital Signs    Vitals:   02/10/21 2000 02/10/21 2342 02/11/21 0356 02/11/21 0737  BP: 108/60 (!) 112/55 124/62 124/69  Pulse: 67 72 65 61  Resp: 20 17 17 19   Temp: 98.3 F (36.8 C) 98.2 F (36.8 C) 97.9 F (36.6 C) 98 F (36.7 C)  TempSrc: Oral Oral Oral Oral  SpO2: 95% 93% 93% 96%  Weight:      Height:        Intake/Output Summary (Last 24 hours) at 02/11/2021 1108 Last data filed at 02/11/2021 0500 Gross per 24 hour  Intake 60 ml  Output 1950 ml  Net -1890 ml   Last 3 Weights 02/08/2021 02/01/2021 01/26/2021  Weight (lbs) 135 lb 147 lb 12.8 oz 148 lb 3.2 oz  Weight (kg) 61.236 kg 67.042 kg 67.223 kg      Telemetry    *** - Personally Reviewed  ECG    *** - Personally Reviewed  Physical Exam  *** GEN: No acute distress.   Neck: No JVD Cardiac: RRR, no murmurs, rubs, or gallops.  Respiratory: Clear to auscultation bilaterally. GI: Soft, nontender, non-distended  MS: No edema; No deformity. Neuro:  Nonfocal  Psych: Normal affect   Labs    High Sensitivity Troponin:   Recent Labs  Lab 02/08/21 2117 02/08/21 2309 02/10/21 1215  TROPONINIHS 3,825* 7,183* 7,446*     Chemistry Recent Labs  Lab 02/08/21 1218 02/08/21 2117 02/09/21 1108 02/10/21 0123 02/11/21 0138  NA 141   < > 140 139 137  K 3.4*   < > 3.6 3.4* 3.8  CL 101   < > 111 109 107  CO2 30   < > 24 25 25   GLUCOSE 150*   < > 132* 101* 112*  BUN 22   < > 34* 28*  25*  CREATININE 1.64*   < > 1.34* 1.34* 1.53*  CALCIUM 9.0   < > 8.3* 8.1* 8.1*  MG  --    < > 2.0 1.9 1.9  PROT 7.0  --   --  4.7* 5.1*  ALBUMIN 3.6  --   --  2.4* 2.5*  AST 25  --   --  94* 58*  ALT 12  --   --  26 23  ALKPHOS 93  --   --  63 71  BILITOT 0.6  --   --  0.8 0.7  GFRNONAA 39*   < > 50* 50* 43*  ANIONGAP 10   < > 5 5 5    < > = values in this interval not displayed.    Lipids No results for input(s): CHOL, TRIG, HDL, LABVLDL, LDLCALC, CHOLHDL in the last 168 hours.  Hematology Recent Labs  Lab 02/09/21 1948 02/10/21 0123 02/11/21 0813  WBC 8.5 7.3 6.7  RBC 2.76* 2.60* 3.23*  HGB 8.7* 8.2* 9.9*  HCT 26.6* 25.3* 31.1*  MCV 96.4 97.3 96.3  MCH 31.5 31.5  30.7  MCHC 32.7 32.4 31.8  RDW 18.4* 18.3* 17.5*  PLT 213 206 227   Thyroid No results for input(s): TSH, FREET4 in the last 168 hours.  BNP Recent Labs  Lab 02/09/21 1108  BNP 1,423.4*    DDimer No results for input(s): DDIMER in the last 168 hours.   Radiology    ECHOCARDIOGRAM COMPLETE  Result Date: 02/09/2021    ECHOCARDIOGRAM REPORT   Patient Name:   Jon Reid Date of Exam: 02/09/2021 Medical Rec #:  [id number]      Height:       66.0 in Accession #:    [id number]     Weight:       135.0 lb Date of Birth:  29-Oct-1928      BSA:          1.692 m Patient Age:    87 years       BP:           93/51 mmHg Patient Gender: M              HR:           64 bpm. Exam Location:  Inpatient Procedure: 2D Echo Indications:    NSTEMI  History:        Patient has prior history of Echocardiogram examinations, most                 recent 06/26/2020. Prior CABG, chronic kidney disease.; Risk                 Factors:Hypertension, Dyslipidemia and Sleep Apnea.  Sonographer:    Johny Chess RDCS Referring Phys: [id number]  1. Left ventricular ejection fraction, by estimation, is 55 to 60%. The left ventricle has normal function. The left ventricle has no regional wall motion abnormalities. Left  ventricular diastolic parameters are consistent with Grade II diastolic dysfunction (pseudonormalization).  2. Right ventricular systolic function is normal. The right ventricular size is normal.  3. Left atrial size was severely dilated.  4. The mitral valve is degenerative. Mild to moderate mitral valve regurgitation. No evidence of mitral stenosis.  5. The aortic valve is tricuspid. There is moderate calcification of the aortic valve. There is moderate thickening of the aortic valve. Aortic valve regurgitation is trivial. Moderate aortic valve stenosis. Aortic valve area, by VTI measures 1.05 cm. Aortic valve mean gradient measures 23.0 mmHg. Aortic valve Vmax measures 3.17 m/s. Comparison(s): No significant change from prior study. Distal septal hypokinesis commented on the prior study. Appears to be postop septum in setting of CABG. AS remains moderate. FINDINGS  Left Ventricle: Left ventricular ejection fraction, by estimation, is 55 to 60%. The left ventricle has normal function. The left ventricle has no regional wall motion abnormalities. The left ventricular internal cavity size was normal in size. There is  no left ventricular hypertrophy. Abnormal (paradoxical) septal motion consistent with post-operative status. Left ventricular diastolic parameters are consistent with Grade II diastolic dysfunction (pseudonormalization). Right Ventricle: The right ventricular size is normal. No increase in right ventricular wall thickness. Right ventricular systolic function is normal. Left Atrium: Left atrial size was severely dilated. Right Atrium: Right atrial size was normal in size. Pericardium: There is no evidence of pericardial effusion. Mitral Valve: The mitral valve is degenerative in appearance. Mild to moderate mitral annular calcification. Mild to moderate mitral valve regurgitation. No evidence of mitral valve stenosis. Tricuspid Valve: The tricuspid valve is grossly normal. Tricuspid valve regurgitation  is mild . No evidence of tricuspid stenosis. Aortic Valve: The aortic valve is tricuspid. There is moderate calcification of the aortic valve. There is moderate thickening of the aortic valve. Aortic valve regurgitation is trivial. Moderate aortic stenosis is present. Aortic valve mean gradient measures 23.0 mmHg. Aortic valve peak gradient measures 40.2 mmHg. Aortic valve area, by VTI measures 1.05 cm. Pulmonic Valve: The pulmonic valve was grossly normal. Pulmonic valve regurgitation is mild to moderate. No evidence of pulmonic stenosis. Aorta: The aortic root and ascending aorta are structurally normal, with no evidence of dilitation. Venous: The right lower pulmonary vein is normal. The inferior vena cava was not well visualized. IAS/Shunts: The atrial septum is grossly normal.  LEFT VENTRICLE PLAX 2D LVIDd:         4.80 cm LVIDs:         3.10 cm LV PW:         1.10 cm LV IVS:        0.90 cm LVOT diam:     2.10 cm LV SV:         76 LV SV Index:   45 LVOT Area:     3.46 cm  RIGHT VENTRICLE RV S prime:     9.79 cm/s TAPSE (M-mode): 1.7 cm LEFT ATRIUM             Index        RIGHT ATRIUM           Index LA diam:        4.40 cm 2.60 cm/m   RA Area:     19.90 cm LA Vol (A2C):   67.7 ml 40.01 ml/m  RA Volume:   51.60 ml  30.49 ml/m LA Vol (A4C):   82.9 ml 48.99 ml/m LA Biplane Vol: 78.5 ml 46.39 ml/m  AORTIC VALVE AV Area (Vmax):    1.01 cm AV Area (Vmean):   0.95 cm AV Area (VTI):     1.05 cm AV Vmax:           317.00 cm/s AV Vmean:          224.000 cm/s AV VTI:            0.725 m AV Peak Grad:      40.2 mmHg AV Mean Grad:      23.0 mmHg LVOT Vmax:         92.00 cm/s LVOT Vmean:        61.600 cm/s LVOT VTI:          0.219 m LVOT/AV VTI ratio: 0.30  AORTA Ao Asc diam: 3.10 cm MITRAL VALVE               TRICUSPID VALVE MV Area (PHT): 2.93 cm    TR Peak grad:   39.9 mmHg MV Decel Time: 259 msec    TR Vmax:        316.00 cm/s MR Peak grad: 93.3 mmHg MR Mean grad: 60.0 mmHg    SHUNTS MR Vmax:      483.00 cm/s   Systemic VTI:  0.22 m MR Vmean:     359.0 cm/s   Systemic Diam: 2.10 cm MV E velocity: 98.10 cm/s MV A velocity: 52.80 cm/s MV E/A ratio:  1.86 Eleonore Chiquito MD Electronically signed by Eleonore Chiquito MD Signature Date/Time: 02/09/2021/6:17:11 PM    Final     Cardiac Studies   Echo 02/09/21   1. Left ventricular ejection fraction, by estimation, is 55 to 60%.  The  left ventricle has normal function. The left ventricle has no regional  wall motion abnormalities. Left ventricular diastolic parameters are  consistent with Grade II diastolic  dysfunction (pseudonormalization).   2. Right ventricular systolic function is normal. The right ventricular  size is normal.   3. Left atrial size was severely dilated.   4. The mitral valve is degenerative. Mild to moderate mitral valve  regurgitation. No evidence of mitral stenosis.   5. The aortic valve is tricuspid. There is moderate calcification of the  aortic valve. There is moderate thickening of the aortic valve. Aortic  valve regurgitation is trivial. Moderate aortic valve stenosis. Aortic  valve area, by VTI measures 1.05 cm.  Aortic valve mean gradient measures 23.0 mmHg. Aortic valve Vmax measures  3.17 m/s.   Comparison(s): No significant change from prior study. Distal septal  hypokinesis commented on the prior study. Appears to be postop septum in  setting of CABG. AS remains moderate.     Patient Profile     Mr. Vandevoort is a 41M with CAD s/p CABG, hypertension, HL, moderate aortic stenosis, chronic diastolic heart failure, CKD 3 admitted with GIB and +troponin.    Assessment & Plan    # Elevated troponin:  # CAD s/p CABG:  HS troponin elevated to >7,000.  However, no WMA on echo and he has no chest pain.  No plan for further ischemic work up given his GIB, lack of symptoms, CKD and age.  He has not been on a statin chronically.  Last LDL was 89.  # Acute on chronic diastolic heart failure:  # moderate aortic stenosis: Mean  gradient was 23 mmHg on echo.  He is volume overloaded with diminished breath sounds and mild lower extremity edema.  Agree with gentle diuresis.   He was -1.8L in the last 24h.  Creatinine is uptrending.   # GIB:  H/H stable today.  Management per IM/GI.  Holding antiplatelets as above.  {Are we signing off today?:[id number]}  For questions or updates, please contact [place] Please consult [email or web address] for contact info under        Signed, Skeet Latch, MD  02/11/2021, 11:08 AM

## 2021-02-11 NOTE — Discharge Instructions (Signed)
Sleep with at least 3 pillows under your head, do not lay flat as you have severe hiatal hernia and will cause esophagitis, nausea vomiting and GI bleed.  Follow with Primary MD Claretta Fraise, MD along with your cardiologist and gastroenterologist in 7 days   Get CBC, CMP, 2 view Chest X ray -  checked next visit within 1 week by Primary MD   Activity: As tolerated with Full fall precautions use walker/cane & assistance as needed  Disposition Home   Diet: Heart Healthy with 1.5 L fluid restriction per day  Special Instructions: If you have smoked or chewed Tobacco  in the last 2 yrs please stop smoking, stop any regular Alcohol  and or any Recreational drug use.  On your next visit with your primary care physician please Get Medicines reviewed and adjusted.  Please request your Prim.MD to go over all Hospital Tests and Procedure/Radiological results at the follow up, please get all Hospital records sent to your Prim MD by signing hospital release before you go home.  If you experience worsening of your admission symptoms, develop shortness of breath, life threatening emergency, suicidal or homicidal thoughts you must seek medical attention immediately by calling 911 or calling your MD immediately  if symptoms less severe.  You Must read complete instructions/literature along with all the possible adverse reactions/side effects for all the Medicines you take and that have been prescribed to you. Take any new Medicines after you have completely understood and accpet all the possible adverse reactions/side effects.

## 2021-02-13 ENCOUNTER — Telehealth: Payer: Self-pay

## 2021-02-13 DIAGNOSIS — Z8673 Personal history of transient ischemic attack (TIA), and cerebral infarction without residual deficits: Secondary | ICD-10-CM | POA: Diagnosis not present

## 2021-02-13 DIAGNOSIS — Z9181 History of falling: Secondary | ICD-10-CM | POA: Diagnosis not present

## 2021-02-13 DIAGNOSIS — E876 Hypokalemia: Secondary | ICD-10-CM | POA: Diagnosis not present

## 2021-02-13 DIAGNOSIS — Z8781 Personal history of (healed) traumatic fracture: Secondary | ICD-10-CM | POA: Diagnosis not present

## 2021-02-13 DIAGNOSIS — M1030 Gout due to renal impairment, unspecified site: Secondary | ICD-10-CM | POA: Diagnosis not present

## 2021-02-13 DIAGNOSIS — Z8546 Personal history of malignant neoplasm of prostate: Secondary | ICD-10-CM | POA: Diagnosis not present

## 2021-02-13 DIAGNOSIS — D62 Acute posthemorrhagic anemia: Secondary | ICD-10-CM | POA: Diagnosis not present

## 2021-02-13 DIAGNOSIS — F482 Pseudobulbar affect: Secondary | ICD-10-CM | POA: Diagnosis not present

## 2021-02-13 DIAGNOSIS — I350 Nonrheumatic aortic (valve) stenosis: Secondary | ICD-10-CM | POA: Diagnosis not present

## 2021-02-13 DIAGNOSIS — N1831 Chronic kidney disease, stage 3a: Secondary | ICD-10-CM | POA: Diagnosis not present

## 2021-02-13 DIAGNOSIS — I129 Hypertensive chronic kidney disease with stage 1 through stage 4 chronic kidney disease, or unspecified chronic kidney disease: Secondary | ICD-10-CM | POA: Diagnosis not present

## 2021-02-13 DIAGNOSIS — N179 Acute kidney failure, unspecified: Secondary | ICD-10-CM | POA: Diagnosis not present

## 2021-02-13 DIAGNOSIS — G20 Parkinson's disease: Secondary | ICD-10-CM | POA: Diagnosis not present

## 2021-02-13 DIAGNOSIS — Z8616 Personal history of COVID-19: Secondary | ICD-10-CM | POA: Diagnosis not present

## 2021-02-13 DIAGNOSIS — Z951 Presence of aortocoronary bypass graft: Secondary | ICD-10-CM | POA: Diagnosis not present

## 2021-02-13 DIAGNOSIS — M4800 Spinal stenosis, site unspecified: Secondary | ICD-10-CM | POA: Diagnosis not present

## 2021-02-13 DIAGNOSIS — K2100 Gastro-esophageal reflux disease with esophagitis, without bleeding: Secondary | ICD-10-CM | POA: Diagnosis not present

## 2021-02-13 DIAGNOSIS — E785 Hyperlipidemia, unspecified: Secondary | ICD-10-CM | POA: Diagnosis not present

## 2021-02-13 DIAGNOSIS — R9431 Abnormal electrocardiogram [ECG] [EKG]: Secondary | ICD-10-CM | POA: Diagnosis not present

## 2021-02-13 DIAGNOSIS — I959 Hypotension, unspecified: Secondary | ICD-10-CM | POA: Diagnosis not present

## 2021-02-13 DIAGNOSIS — M1990 Unspecified osteoarthritis, unspecified site: Secondary | ICD-10-CM | POA: Diagnosis not present

## 2021-02-13 DIAGNOSIS — I452 Bifascicular block: Secondary | ICD-10-CM | POA: Diagnosis not present

## 2021-02-13 DIAGNOSIS — Z7982 Long term (current) use of aspirin: Secondary | ICD-10-CM | POA: Diagnosis not present

## 2021-02-13 DIAGNOSIS — K922 Gastrointestinal hemorrhage, unspecified: Secondary | ICD-10-CM | POA: Diagnosis not present

## 2021-02-13 DIAGNOSIS — I2510 Atherosclerotic heart disease of native coronary artery without angina pectoris: Secondary | ICD-10-CM | POA: Diagnosis not present

## 2021-02-13 NOTE — Telephone Encounter (Signed)
Transition Care Management Follow-up Telephone Call Date of discharge and from where: [facility] 02/11/21 Diagnosis: Upper GI Bleed How have you been since you were released from the hospital? Spoke with patient's wife, Peter Congo. Peter Congo states patient is doing well and eating well.  Any questions or concerns? No  Items Reviewed: Did the pt receive and understand the discharge instructions provided? Yes  Medications obtained and verified? Yes  Other? No  Any new allergies since your discharge? No  Dietary orders reviewed? Yes Do you have support at home? Yes   Home Care and Equipment/Supplies: Were home health services ordered? yes If so, what is the name of the agency? [facility]  Has the agency set up a time to come to the patient's home? yes Were any new equipment or medical supplies ordered?  No What is the name of the medical supply agency?  Were you able to get the supplies/equipment? not applicable Do you have any questions related to the use of the equipment or supplies? No  Functional Questionnaire: (I = Independent and D = Dependent) ADLs: I  Bathing/Dressing- I  Meal Prep- I  Eating- I  Maintaining continence- I  Transferring/Ambulation- I  Managing Meds- I  Follow up appointments reviewed:  PCP Hospital f/u appt confirmed? Yes  Scheduled to see Darla Lesches, FNP on 02/23/21 @ 9:05. [facility] f/u appt confirmed?  Cardiology and GI have not been contacted yet.    nAre transportation arrangements needed? No  If their condition worsens, is the pt aware to call PCP or go to the Emergency Dept.? Yes Was the patient provided with contact information for the PCP's office or ED? Yes Was to pt encouraged to call back with questions or concerns? Yes

## 2021-02-14 ENCOUNTER — Ambulatory Visit: Payer: MEDICARE

## 2021-02-17 ENCOUNTER — Ambulatory Visit: Payer: 59 | Admitting: Gastroenterology

## 2021-02-17 DIAGNOSIS — I2510 Atherosclerotic heart disease of native coronary artery without angina pectoris: Secondary | ICD-10-CM | POA: Diagnosis not present

## 2021-02-17 DIAGNOSIS — N1831 Chronic kidney disease, stage 3a: Secondary | ICD-10-CM | POA: Diagnosis not present

## 2021-02-17 DIAGNOSIS — K922 Gastrointestinal hemorrhage, unspecified: Secondary | ICD-10-CM | POA: Diagnosis not present

## 2021-02-17 DIAGNOSIS — D62 Acute posthemorrhagic anemia: Secondary | ICD-10-CM | POA: Diagnosis not present

## 2021-02-17 DIAGNOSIS — I129 Hypertensive chronic kidney disease with stage 1 through stage 4 chronic kidney disease, or unspecified chronic kidney disease: Secondary | ICD-10-CM | POA: Diagnosis not present

## 2021-02-17 DIAGNOSIS — N179 Acute kidney failure, unspecified: Secondary | ICD-10-CM | POA: Diagnosis not present

## 2021-02-21 DIAGNOSIS — N1831 Chronic kidney disease, stage 3a: Secondary | ICD-10-CM | POA: Diagnosis not present

## 2021-02-21 DIAGNOSIS — I129 Hypertensive chronic kidney disease with stage 1 through stage 4 chronic kidney disease, or unspecified chronic kidney disease: Secondary | ICD-10-CM | POA: Diagnosis not present

## 2021-02-21 DIAGNOSIS — K922 Gastrointestinal hemorrhage, unspecified: Secondary | ICD-10-CM | POA: Diagnosis not present

## 2021-02-21 DIAGNOSIS — N179 Acute kidney failure, unspecified: Secondary | ICD-10-CM | POA: Diagnosis not present

## 2021-02-21 DIAGNOSIS — D62 Acute posthemorrhagic anemia: Secondary | ICD-10-CM | POA: Diagnosis not present

## 2021-02-21 DIAGNOSIS — I2510 Atherosclerotic heart disease of native coronary artery without angina pectoris: Secondary | ICD-10-CM | POA: Diagnosis not present

## 2021-02-23 ENCOUNTER — Encounter: Payer: Self-pay | Admitting: Family Medicine

## 2021-02-23 ENCOUNTER — Ambulatory Visit (INDEPENDENT_AMBULATORY_CARE_PROVIDER_SITE_OTHER): Payer: MEDICARE | Admitting: Family Medicine

## 2021-02-23 ENCOUNTER — Ambulatory Visit (INDEPENDENT_AMBULATORY_CARE_PROVIDER_SITE_OTHER): Payer: MEDICARE

## 2021-02-23 VITALS — BP 131/67 | HR 76 | Temp 97.4°F | Ht 66.0 in | Wt 153.0 lb

## 2021-02-23 DIAGNOSIS — D649 Anemia, unspecified: Secondary | ICD-10-CM | POA: Diagnosis not present

## 2021-02-23 DIAGNOSIS — J9811 Atelectasis: Secondary | ICD-10-CM | POA: Diagnosis not present

## 2021-02-23 DIAGNOSIS — I129 Hypertensive chronic kidney disease with stage 1 through stage 4 chronic kidney disease, or unspecified chronic kidney disease: Secondary | ICD-10-CM | POA: Diagnosis not present

## 2021-02-23 DIAGNOSIS — M7989 Other specified soft tissue disorders: Secondary | ICD-10-CM | POA: Diagnosis not present

## 2021-02-23 DIAGNOSIS — N1831 Chronic kidney disease, stage 3a: Secondary | ICD-10-CM | POA: Diagnosis not present

## 2021-02-23 DIAGNOSIS — Z09 Encounter for follow-up examination after completed treatment for conditions other than malignant neoplasm: Secondary | ICD-10-CM

## 2021-02-23 DIAGNOSIS — N179 Acute kidney failure, unspecified: Secondary | ICD-10-CM | POA: Diagnosis not present

## 2021-02-23 DIAGNOSIS — K922 Gastrointestinal hemorrhage, unspecified: Secondary | ICD-10-CM

## 2021-02-23 DIAGNOSIS — J90 Pleural effusion, not elsewhere classified: Secondary | ICD-10-CM

## 2021-02-23 DIAGNOSIS — R0602 Shortness of breath: Secondary | ICD-10-CM | POA: Diagnosis not present

## 2021-02-23 DIAGNOSIS — K449 Diaphragmatic hernia without obstruction or gangrene: Secondary | ICD-10-CM | POA: Diagnosis not present

## 2021-02-23 DIAGNOSIS — I2510 Atherosclerotic heart disease of native coronary artery without angina pectoris: Secondary | ICD-10-CM | POA: Diagnosis not present

## 2021-02-23 DIAGNOSIS — D62 Acute posthemorrhagic anemia: Secondary | ICD-10-CM | POA: Diagnosis not present

## 2021-02-23 NOTE — Progress Notes (Signed)
Subjective:  Patient ID: Jon Reid, male    DOB: [birth year], 87 y.o.   MRN: [id number]  Patient Care Team: Claretta Fraise, MD as PCP - General (Family Medicine) Minus Breeding, MD as PCP - Cardiology (Cardiology) Festus Aloe, MD (Urology) Curt Bears, MD (Hematology and Oncology) Warden Fillers, MD as Consulting Physician (Ophthalmology) Juanita Craver, MD as Consulting Physician (Gastroenterology) Latanya Maudlin, MD as Consulting Physician (Orthopedic Surgery) Kathrynn Ducking, MD as Consulting Physician (Neurology) Josue Hector, MD as Consulting Physician (Cardiology) Renette Butters, MD as Consulting Physician (Orthopedic Surgery)   Chief Complaint:  Hospitalization Follow-up   HPI: Jon Reid is a 87 y.o. male presenting on 02/23/2021 for Hospitalization Follow-up   Patient presents today for hospital discharge follow-up.  He was admitted to denies this currently on 02/08/2021 for nausea, vomiting, acute kidney injury, anemia, upper GI bleed, hypotension, and hypokalemia.  He was discharged home 02/11/2021 and was contacted by office on 02/13/2021.  He states he has been monitoring well since being home.  [place] is involved in care.  He is ambulating today and doing.  Chest x-ray during hospitalization revealed a large hiatal hernia.  His troponin was noted to be elevated during admission.  He is to follow-up with GI and cardiology, those appointments have been scheduled.Pertinent labs during admission Hgb 9.9, K 3.4, creatinine 1.64, troponin 7446. EKG without acute ischemic changes, troponin elevation likely due to AKI.  He states his legs are slightly swollen but have been this way for a while.  He denies any chest pain or shortness of breath.  States he is slowly gaining his strength back.   Relevant past medical, surgical, family, and social history reviewed and updated as indicated.  Allergies and medications reviewed and updated.  Data reviewed: Chart in Epic.   Past Medical History:  Diagnosis Date   Arthritis    Carcinoma of prostate ([place])    prostate   CKD (chronic kidney disease), stage III (HCC)    Coronary artery disease    a. 10/2012 Cath: LM 20-30d, lAD 80p, 74m 80-90d, LCX 90-980mOM1 90, RCA 50p, 2061m0d, RPDA 90, EF nl; b. 10/2012 CABG x 4 (LIMA-LAD, SVG-OM1, SVG-PDA->RPL); c. 07/2015 MV: Basal and proximal septal infarct w/o ischemia. EF 57%-->low risk.   First degree AV block 01/28/2019   Noted on EKG   GERD (gastroesophageal reflux disease)    Gout    HTN (hypertension)    Macrocytosis without anemia 01/08/2012   Moderate aortic stenosis    a. 10/2012 Echo: EF 55-60%, no rwma, BAE, mild AI, mild TR; b. 06/2020 Echo: EF 50-55%, no rwma, mild LVH. Mod dil LA. Mild MR. Mod AS.   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   Sleep apnea    Questionable   Spinal stenosis    [facility]   Past Surgical History:  Procedure Laterality Date   BALLOON DILATION N/A 10/03/2020   Procedure: BALLOON DILATION;  Surgeon: CarEloise HarmanO;  [facility];  Service: Endoscopy;  Laterality: N/A;   CARDIAC CATHETERIZATION  11/07/2012   Dr NahAcie FredricksonCATARACT EXTRACTION W/ INTRAOCULAR LENS IMPLANT Bilateral    CORONARY ARTERY BYPASS GRAFT N/A 11/11/2012   Procedure: CORONARY ARTERY BYPASS GRAFTING times four using Right Greater Saphenous Vein Graft harvested endoscopically and Left Internal Mammary Artery.;  Surgeon: PetCollier [place]ina  Prescott Gum, MD;  Location: [facility];  Service: Open Heart Surgery;  Laterality: N/A;   ESOPHAGEAL BRUSHING  10/03/2020   Procedure: ESOPHAGEAL BRUSHING;  Surgeon: [name], DO;  [facility];  Service: Endoscopy;;   ESOPHAGOGASTRODUODENOSCOPY (EGD) WITH PROPOFOL N/A 06/27/2020   Surgeon: [name], DO;  large hiatal hernia, LA grade D esophagitis with no bleeding, normal examined  duodenum.    ESOPHAGOGASTRODUODENOSCOPY (EGD) WITH PROPOFOL N/A 10/03/2020   Procedure: ESOPHAGOGASTRODUODENOSCOPY (EGD) WITH PROPOFOL;  Surgeon: [name], DO;  [facility];  Service: Endoscopy;  Laterality: N/A;  2:00pm   HIP ARTHROPLASTY Right 11/13/2020   Procedure: ARTHROPLASTY BIPOLAR HIP (HEMIARTHROPLASTY);  Surgeon: Renette Butters, MD;  Location: [facility];  Service: Orthopedics;  Laterality: Right;   INGUINAL HERNIA REPAIR Right 01/10/2017   Procedure: OPEN RIGHT HERNIA REPAIR INGUINAL;  Surgeon: Ileana Roup, MD;  [facility];  Service: General;  Laterality: Right;   INSERTION OF MESH Right 01/10/2017   Procedure: INSERTION OF MESH;  Surgeon: Ileana Roup, MD;  [facility];  Service: General;  Laterality: Right;   INTRAOPERATIVE TRANSESOPHAGEAL ECHOCARDIOGRAM N/A 11/11/2012   Procedure: INTRAOPERATIVE TRANSESOPHAGEAL ECHOCARDIOGRAM;  Surgeon: Ivin Poot, MD;  Location: [facility];  Service: Open Heart Surgery;  Laterality: N/A;   LEFT HEART CATHETERIZATION WITH CORONARY ANGIOGRAM N/A 11/07/2012   Procedure: LEFT HEART CATHETERIZATION WITH CORONARY ANGIOGRAM;  Surgeon: Thayer Headings, MD;  Location[facility];  Service: Cardiovascular;  Laterality: N/A;   LUMBAR LAMINECTOMY/DECOMPRESSION MICRODISCECTOMY N/A 02/05/2019   Procedure: LUMBAR LAMINECTOMY/DECOMPRESSION L3-L4;  Surgeon: Latanya Maudlin, MD;  [facility];  Service: Orthopedics;  Laterality: N/A;  39mn   LYMPH NODE DISSECTION     Bilateral pelvic   RETROPUBIC PROSTATECTOMY     Radical    Social History   Socioeconomic History   Marital status: Married    Spouse name: [name]  Number of children: 3   Years of education: 12   Highest education level: High school graduate  Occupational History   Occupation: Retired  Tobacco Use   Smoking status: Never   Smokeless tobacco: Never  VScientific laboratory technicianUse: Never used  Substance and Sexual Activity   Alcohol use: Yes     Comment: Rare   Drug use: No   Sexual activity: Not Currently  Other Topics Concern   Not on file  Social History Narrative   Lives at home with wife/sons will stay with them occassionally   Married with 3 children   Denies caffeine use    Social Determinants of HRadio broadcast assistantStrain: Not on file  Food Insecurity: Not on file  Transportation Needs: Not on file  Physical Activity: Not on file  Stress: Not on file  Social Connections: Not on file  Intimate Partner Violence: Not on file    Outpatient Encounter Medications as of 02/23/2021  Medication Sig   ascorbic acid (VITAMIN C) 500 MG tablet Take 1 tablet (500 mg total) by mouth daily.   aspirin 81 MG EC tablet Take 1 tablet (81 mg total) by mouth daily. Swallow whole.   carbidopa-levodopa (SINEMET IR) 25-100 MG tablet Take 1.5 tablets by mouth 3 (three) times daily. (Patient taking differently: Take 1.5 tablets by mouth 2 (two) times daily.)   Cholecalciferol (VITAMIN D) 2000 units CAPS Take 2,000 Units by mouth daily.   docusate sodium (COLACE) 100 MG capsule Take 1 capsule (100 mg total) by  mouth 2 (two) times daily.   fluticasone (FLONASE) 50 MCG/ACT nasal spray One to 2 sprays each nostril at bedtime (Patient taking differently: Place 2 sprays into both nostrils daily as needed for allergies.)   furosemide (LASIX) 40 MG tablet Take 1 tablet (40 mg total) by mouth 2 (two) times daily. One at breakfast. The other with lunch (Patient taking differently: Take 40 mg by mouth daily. One at breakfast.)   guaiFENesin (MUCINEX) 600 MG 12 hr tablet Take 600 mg by mouth 2 (two) times daily as needed for cough.   Iron, Ferrous Sulfate, 325 (65 Fe) MG TABS Take 1 tablet by mouth daily. (Patient taking differently: Take 325 mg by mouth daily.)   melatonin 5 MG TABS Take 1 tablet (5 mg total) by mouth at bedtime.   Multiple Vitamins-Minerals (CERTAVITE/ANTIOXIDANTS) TABS Take 1 tablet by mouth daily.   nitroGLYCERIN (NITROSTAT)  0.4 MG SL tablet Place 1 tablet (0.4 mg total) under the tongue every 5 (five) minutes as needed for chest pain.   pantoprazole (PROTONIX) 40 MG tablet Take 1 tablet (40 mg total) by mouth 2 (two) times daily.   potassium chloride SA (KLOR-CON) 10 MEQ tablet Take 2 tablets (20 mEq total) by mouth daily. For potassium replacement/ supplement   senna-docusate (SENOKOT-S) 8.6-50 MG tablet Take 2 tablets by mouth daily with supper.   triamcinolone cream (KENALOG) 0.1 % Apply 1 application topically daily as needed (itching).   No facility-administered encounter medications on file as of 02/23/2021.    Allergies  Allergen Reactions   Crestor [Rosuvastatin] Swelling    GYNECOMASTIA    Trazodone And Nefazodone     Hallucinations    Review of Systems  Constitutional:  Positive for activity change and appetite change. Negative for chills, diaphoresis, fatigue, fever and unexpected weight change.  Respiratory:  Negative for cough and shortness of breath.   Cardiovascular:  Positive for leg swelling. Negative for chest pain and palpitations.  Gastrointestinal:  Negative for abdominal distention, abdominal pain, anal bleeding, blood in stool, constipation, diarrhea, nausea, rectal pain and vomiting.  Genitourinary:  Negative for decreased urine volume and difficulty urinating.  Musculoskeletal:  Positive for arthralgias and gait problem.  Neurological:  Negative for dizziness, tremors, seizures, syncope, facial asymmetry, speech difficulty, weakness, light-headedness, numbness and headaches.  Psychiatric/Behavioral:  Negative for confusion.   All other systems reviewed and are negative.      Objective:  BP 131/67   Pulse 76   Temp (!) 97.4 F (36.3 C)   Ht 5' 6"  (1.676 m)   Wt 153 lb (69.4 kg)   SpO2 97%   BMI 24.69 kg/m    Wt Readings from Last 3 Encounters:  02/23/21 153 lb (69.4 kg)  02/08/21 135 lb (61.2 kg)  02/01/21 147 lb 12.8 oz (67 kg)    Physical Exam Vitals and nursing  note reviewed.  Constitutional:      General: He is not in acute distress.    Appearance: He is ill-appearing (chronically ill). He is not toxic-appearing or diaphoretic.  HENT:     Head: Normocephalic and atraumatic.     Mouth/Throat:     Mouth: Mucous membranes are moist.     Pharynx: Oropharyngeal exudate present.  Eyes:     Conjunctiva/sclera: Conjunctivae normal.     Pupils: Pupils are equal, round, and reactive to light.  Cardiovascular:     Rate and Rhythm: Normal rate and regular rhythm.     Heart sounds: Murmur heard.  Systolic murmur  is present with a grade of 3/6.    No friction rub. No gallop.  Pulmonary:     Effort: Pulmonary effort is normal.     Breath sounds: Normal breath sounds.  Abdominal:     General: Bowel sounds are normal. There is no distension.     Palpations: Abdomen is soft.     Tenderness: There is no abdominal tenderness.  Musculoskeletal:     Right lower leg: 1+ Pitting Edema present.     Left lower leg: 1+ Pitting Edema present.  Skin:    General: Skin is warm and dry.     Capillary Refill: Capillary refill takes less than 2 seconds.  Neurological:     General: No focal deficit present.     Mental Status: He is alert and oriented to person, place, and time.     Gait: Gait abnormal (using walker).  Psychiatric:        Mood and Affect: Mood normal.        Behavior: Behavior normal.        Thought Content: Thought content normal.        Judgment: Judgment normal.    Results for orders placed or performed during the hospital encounter of 02/08/21  Resp Panel by RT-PCR (Flu A&B, Covid) Nasopharyngeal Swab   Specimen: Nasopharyngeal Swab; Nasopharyngeal(NP) swabs in vial transport medium  Result Value Ref Range   SARS Coronavirus 2 by RT PCR NEGATIVE NEGATIVE   Influenza A by PCR NEGATIVE NEGATIVE   Influenza B by PCR NEGATIVE NEGATIVE  Lipase, blood  Result Value Ref Range   Lipase 35 11 - 51 U/L  Comprehensive metabolic panel  Result  Value Ref Range   Sodium 141 135 - 145 mmol/L   Potassium 3.4 (L) 3.5 - 5.1 mmol/L   Chloride 101 98 - 111 mmol/L   CO2 30 22 - 32 mmol/L   Glucose, Bld 150 (H) 70 - 99 mg/dL   BUN 22 8 - 23 mg/dL   Creatinine, Ser 1.64 (H) 0.61 - 1.24 mg/dL   Calcium 9.0 8.9 - 10.3 mg/dL   Total Protein 7.0 6.5 - 8.1 g/dL   Albumin 3.6 3.5 - 5.0 g/dL   AST 25 15 - 41 U/L   ALT 12 0 - 44 U/L   Alkaline Phosphatase 93 38 - 126 U/L   Total Bilirubin 0.6 0.3 - 1.2 mg/dL   GFR, Estimated 39 (L) >60 mL/min   Anion gap 10 5 - 15  CBC  Result Value Ref Range   WBC 8.5 4.0 - 10.5 K/uL   RBC 3.20 (L) 4.22 - 5.81 MIL/uL   Hemoglobin 9.9 (L) 13.0 - 17.0 g/dL   HCT 32.4 (L) 39.0 - 52.0 %   MCV 101.3 (H) 80.0 - 100.0 fL   MCH 30.9 26.0 - 34.0 pg   MCHC 30.6 30.0 - 36.0 g/dL   RDW 17.9 (H) 11.5 - 15.5 %   Platelets 279 150 - 400 K/uL   nRBC 0.0 0.0 - 0.2 %  CBC  Result Value Ref Range   WBC 9.4 4.0 - 10.5 K/uL   RBC 2.57 (L) 4.22 - 5.81 MIL/uL   Hemoglobin 8.1 (L) 13.0 - 17.0 g/dL   HCT 26.0 (L) 39.0 - 52.0 %   MCV 101.2 (H) 80.0 - 100.0 fL   MCH 31.5 26.0 - 34.0 pg   MCHC 31.2 30.0 - 36.0 g/dL   RDW 17.7 (H) 11.5 - 15.5 %   Platelets 224 150 -  400 K/uL   nRBC 0.0 0.0 - 0.2 %  CBC  Result Value Ref Range   WBC 9.7 4.0 - 10.5 K/uL   RBC 2.50 (L) 4.22 - 5.81 MIL/uL   Hemoglobin 7.9 (L) 13.0 - 17.0 g/dL   HCT 24.9 (L) 39.0 - 52.0 %   MCV 99.6 80.0 - 100.0 fL   MCH 31.6 26.0 - 34.0 pg   MCHC 31.7 30.0 - 36.0 g/dL   RDW 18.0 (H) 11.5 - 15.5 %   Platelets 204 150 - 400 K/uL   nRBC 0.0 0.0 - 0.2 %  Basic metabolic panel  Result Value Ref Range   Sodium 140 135 - 145 mmol/L   Potassium 3.7 3.5 - 5.1 mmol/L   Chloride 110 98 - 111 mmol/L   CO2 23 22 - 32 mmol/L   Glucose, Bld 95 70 - 99 mg/dL   BUN 39 (H) 8 - 23 mg/dL   Creatinine, Ser 1.31 (H) 0.61 - 1.24 mg/dL   Calcium 8.1 (L) 8.9 - 10.3 mg/dL   GFR, Estimated 52 (L) >60 mL/min   Anion gap 7 5 - 15  Magnesium  Result Value Ref Range    Magnesium 2.0 1.7 - 2.4 mg/dL  CBC with Differential/Platelet  Result Value Ref Range   WBC 8.5 4.0 - 10.5 K/uL   RBC 2.76 (L) 4.22 - 5.81 MIL/uL   Hemoglobin 8.7 (L) 13.0 - 17.0 g/dL   HCT 26.6 (L) 39.0 - 52.0 %   MCV 96.4 80.0 - 100.0 fL   MCH 31.5 26.0 - 34.0 pg   MCHC 32.7 30.0 - 36.0 g/dL   RDW 18.4 (H) 11.5 - 15.5 %   Platelets 213 150 - 400 K/uL   nRBC 0.0 0.0 - 0.2 %   Neutrophils Relative % 63 %   Neutro Abs 5.4 1.7 - 7.7 K/uL   Lymphocytes Relative 27 %   Lymphs Abs 2.3 0.7 - 4.0 K/uL   Monocytes Relative 8 %   Monocytes Absolute 0.7 0.1 - 1.0 K/uL   Eosinophils Relative 1 %   Eosinophils Absolute 0.1 0.0 - 0.5 K/uL   Basophils Relative 1 %   Basophils Absolute 0.1 0.0 - 0.1 K/uL   Immature Granulocytes 0 %   Abs Immature Granulocytes 0.03 0.00 - 0.07 K/uL  CBC  Result Value Ref Range   WBC 7.4 4.0 - 10.5 K/uL   RBC 2.74 (L) 4.22 - 5.81 MIL/uL   Hemoglobin 8.4 (L) 13.0 - 17.0 g/dL   HCT 26.5 (L) 39.0 - 52.0 %   MCV 96.7 80.0 - 100.0 fL   MCH 30.7 26.0 - 34.0 pg   MCHC 31.7 30.0 - 36.0 g/dL   RDW 18.1 (H) 11.5 - 15.5 %   Platelets 214 150 - 400 K/uL   nRBC 0.0 0.0 - 0.2 %  Brain natriuretic peptide  Result Value Ref Range   B Natriuretic Peptide 1,423.4 (H) 0.0 - 100.0 pg/mL  Basic metabolic panel  Result Value Ref Range   Sodium 140 135 - 145 mmol/L   Potassium 3.6 3.5 - 5.1 mmol/L   Chloride 111 98 - 111 mmol/L   CO2 24 22 - 32 mmol/L   Glucose, Bld 132 (H) 70 - 99 mg/dL   BUN 34 (H) 8 - 23 mg/dL   Creatinine, Ser 1.34 (H) 0.61 - 1.24 mg/dL   Calcium 8.3 (L) 8.9 - 10.3 mg/dL   GFR, Estimated 50 (L) >60 mL/min   Anion gap 5 5 -  15  Magnesium  Result Value Ref Range   Magnesium 2.0 1.7 - 2.4 mg/dL  CBC with Differential/Platelet  Result Value Ref Range   WBC 7.3 4.0 - 10.5 K/uL   RBC 2.60 (L) 4.22 - 5.81 MIL/uL   Hemoglobin 8.2 (L) 13.0 - 17.0 g/dL   HCT 25.3 (L) 39.0 - 52.0 %   MCV 97.3 80.0 - 100.0 fL   MCH 31.5 26.0 - 34.0 pg   MCHC 32.4 30.0  - 36.0 g/dL   RDW 18.3 (H) 11.5 - 15.5 %   Platelets 206 150 - 400 K/uL   nRBC 0.0 0.0 - 0.2 %   Neutrophils Relative % 64 %   Neutro Abs 4.7 1.7 - 7.7 K/uL   Lymphocytes Relative 25 %   Lymphs Abs 1.8 0.7 - 4.0 K/uL   Monocytes Relative 9 %   Monocytes Absolute 0.7 0.1 - 1.0 K/uL   Eosinophils Relative 1 %   Eosinophils Absolute 0.1 0.0 - 0.5 K/uL   Basophils Relative 1 %   Basophils Absolute 0.1 0.0 - 0.1 K/uL   Immature Granulocytes 0 %   Abs Immature Granulocytes 0.02 0.00 - 0.07 K/uL  Magnesium  Result Value Ref Range   Magnesium 1.9 1.7 - 2.4 mg/dL  Comprehensive metabolic panel  Result Value Ref Range   Sodium 139 135 - 145 mmol/L   Potassium 3.4 (L) 3.5 - 5.1 mmol/L   Chloride 109 98 - 111 mmol/L   CO2 25 22 - 32 mmol/L   Glucose, Bld 101 (H) 70 - 99 mg/dL   BUN 28 (H) 8 - 23 mg/dL   Creatinine, Ser 1.34 (H) 0.61 - 1.24 mg/dL   Calcium 8.1 (L) 8.9 - 10.3 mg/dL   Total Protein 4.7 (L) 6.5 - 8.1 g/dL   Albumin 2.4 (L) 3.5 - 5.0 g/dL   AST 94 (H) 15 - 41 U/L   ALT 26 0 - 44 U/L   Alkaline Phosphatase 63 38 - 126 U/L   Total Bilirubin 0.8 0.3 - 1.2 mg/dL   GFR, Estimated 50 (L) >60 mL/min   Anion gap 5 5 - 15  Magnesium  Result Value Ref Range   Magnesium 1.9 1.7 - 2.4 mg/dL  Comprehensive metabolic panel  Result Value Ref Range   Sodium 137 135 - 145 mmol/L   Potassium 3.8 3.5 - 5.1 mmol/L   Chloride 107 98 - 111 mmol/L   CO2 25 22 - 32 mmol/L   Glucose, Bld 112 (H) 70 - 99 mg/dL   BUN 25 (H) 8 - 23 mg/dL   Creatinine, Ser 1.53 (H) 0.61 - 1.24 mg/dL   Calcium 8.1 (L) 8.9 - 10.3 mg/dL   Total Protein 5.1 (L) 6.5 - 8.1 g/dL   Albumin 2.5 (L) 3.5 - 5.0 g/dL   AST 58 (H) 15 - 41 U/L   ALT 23 0 - 44 U/L   Alkaline Phosphatase 71 38 - 126 U/L   Total Bilirubin 0.7 0.3 - 1.2 mg/dL   GFR, Estimated 43 (L) >60 mL/min   Anion gap 5 5 - 15  CBC with Differential/Platelet  Result Value Ref Range   WBC 6.7 4.0 - 10.5 K/uL   RBC 3.23 (L) 4.22 - 5.81 MIL/uL    Hemoglobin 9.9 (L) 13.0 - 17.0 g/dL   HCT 31.1 (L) 39.0 - 52.0 %   MCV 96.3 80.0 - 100.0 fL   MCH 30.7 26.0 - 34.0 pg   MCHC 31.8 30.0 - 36.0 g/dL  RDW 17.5 (H) 11.5 - 15.5 %   Platelets 227 150 - 400 K/uL   nRBC 0.0 0.0 - 0.2 %   Neutrophils Relative % 65 %   Neutro Abs 4.4 1.7 - 7.7 K/uL   Lymphocytes Relative 22 %   Lymphs Abs 1.5 0.7 - 4.0 K/uL   Monocytes Relative 10 %   Monocytes Absolute 0.6 0.1 - 1.0 K/uL   Eosinophils Relative 2 %   Eosinophils Absolute 0.1 0.0 - 0.5 K/uL   Basophils Relative 1 %   Basophils Absolute 0.1 0.0 - 0.1 K/uL   Immature Granulocytes 0 %   Abs Immature Granulocytes 0.02 0.00 - 0.07 K/uL  POC occult blood, ED  Result Value Ref Range   Fecal Occult Bld NEGATIVE NEGATIVE  ECHOCARDIOGRAM COMPLETE  Result Value Ref Range   Weight 2,160 oz   Height 66 in   BP 93/51 mmHg   S' Lateral 3.10 cm   AR max vel 1.01 cm2   AV Area VTI 1.05 cm2   AV Mean grad 23.0 mmHg   AV Peak grad 40.2 mmHg   Ao pk vel 3.17 m/s   Area-P 1/2 2.93 cm2   MV M vel 4.83 m/s   AV Area mean vel 0.95 cm2   MV Peak grad 93.3 mmHg  Type and screen [place]  Result Value Ref Range   ABO/RH(D) O POS    Antibody Screen NEG    Sample Expiration 02/11/2021,2359    Unit Number [id number]    Blood Component Type RED CELLS,LR    Unit division 00    Status of Unit ISSUED,FINAL    Transfusion Status OK TO TRANSFUSE    Crossmatch Result      Compatible Performed at [facility], [address] [place], [place] [address]   Prepare RBC (crossmatch)  Result Value Ref Range   Order Confirmation      ORDER PROCESSED BY BLOOD BANK Performed at [facility], [place] [address] [place], [place] [address]   BPAM RBC  Result Value Ref Range   ISSUE DATE / TIME [id number]    Blood Product Unit Number [id number]    PRODUCT CODE [id number]    Unit Type and Rh 5100    Blood Product Expiration Date 202212212359   Troponin I (High Sensitivity)   Result Value Ref Range   Troponin I (High Sensitivity) 3,825 (HH) <18 ng/L  Troponin I (High Sensitivity)  Result Value Ref Range   Troponin I (High Sensitivity) 7,183 (HH) <18 ng/L  Troponin I (High Sensitivity)  Result Value Ref Range   Troponin I (High Sensitivity) 7,446 (HH) <18 ng/L       Pertinent labs & imaging results that were available during my care of the patient were reviewed by me and considered in my medical decision making.  Assessment & Plan:  Hilario was seen today for hospitalization follow-up.  Diagnoses and all orders for this visit:  Hospital discharge follow-up AKI Acute on chronic anemia Upper GI bleed Pleural effusion Leg swelling Today's visit was for Transitional Care Management. The patient was discharged from [facility] on 02/11/2021 with a primary diagnosis of nausea and vomiting, AKI, anemia, hypotension, hypokalemia, and upper GI bleed.  Contact with the patient and/or caregiver, by a clinical staff member, was made on 02/13/2021 and was documented as a telephone encounter within the EMR. Through chart review and discussion with the patient I have determined that management of their condition is  of high complexity.   Pt aware to keep follow up appointments with cardiology and GI. Will repeat below labs today and CXR. Follow up with PCP in 1 month or sooner if needed.  -     CBC with Differential/Platelet -     BMP8+EGFR -     DG Chest 2 View; Future -     Brain natriuretic peptide     Continue all other maintenance medications.  Follow up plan: Return in about 1 month (around 03/26/2021).   Continue healthy lifestyle choices, including diet (rich in fruits, vegetables, and lean proteins, and low in salt and simple carbohydrates) and exercise (at least 30 minutes of moderate physical activity daily).  The above assessment and management plan was discussed with the patient. The patient verbalized understanding of and has agreed to the management  plan. Patient is aware to call the clinic if they develop any new symptoms or if symptoms persist or worsen. Patient is aware when to return to the clinic for a follow-up visit. Patient educated on when it is appropriate to go to the emergency department.   Monia Pouch, FNP-C [facility] [phone]

## 2021-02-23 NOTE — Patient Instructions (Signed)
Make follow up to see cardiology and gastroenterology as discussed.  Follow up with PCP in 1 month

## 2021-02-24 LAB — BMP8+EGFR
BUN/Creatinine Ratio: 16 (ref 10–24)
BUN: 22 mg/dL (ref 10–36)
CO2: 22 mmol/L (ref 20–29)
Calcium: 9.2 mg/dL (ref 8.6–10.2)
Chloride: 102 mmol/L (ref 96–106)
Creatinine, Ser: 1.41 mg/dL — ABNORMAL HIGH (ref 0.76–1.27)
Glucose: 127 mg/dL — ABNORMAL HIGH (ref 70–99)
Potassium: 3.8 mmol/L (ref 3.5–5.2)
Sodium: 144 mmol/L (ref 134–144)
eGFR: 47 mL/min/{1.73_m2} — ABNORMAL LOW (ref >59)

## 2021-02-24 LAB — CBC WITH DIFFERENTIAL/PLATELET
Basophils Absolute: 0.1 10*3/uL (ref 0.0–0.2)
Basos: 1 %
EOS (ABSOLUTE): 0.2 10*3/uL (ref 0.0–0.4)
Eos: 2 %
Hematocrit: 32.4 % — ABNORMAL LOW (ref 37.5–51.0)
Hemoglobin: 10.5 g/dL — ABNORMAL LOW (ref 13.0–17.7)
Immature Grans (Abs): 0 10*3/uL (ref 0.0–0.1)
Immature Granulocytes: 0 %
Lymphocytes Absolute: 1.7 10*3/uL (ref 0.7–3.1)
Lymphs: 17 %
MCH: 30 pg (ref 26.6–33.0)
MCHC: 32.4 g/dL (ref 31.5–35.7)
MCV: 93 fL (ref 79–97)
Monocytes Absolute: 0.7 10*3/uL (ref 0.1–0.9)
Monocytes: 8 %
Neutrophils Absolute: 7.2 10*3/uL — ABNORMAL HIGH (ref 1.4–7.0)
Neutrophils: 72 %
Platelets: 369 10*3/uL (ref 150–450)
RBC: 3.5 x10E6/uL — ABNORMAL LOW (ref 4.14–5.80)
RDW: 14.8 % (ref 11.6–15.4)
WBC: 9.9 10*3/uL (ref 3.4–10.8)

## 2021-02-24 LAB — BRAIN NATRIURETIC PEPTIDE: BNP: 448.9 pg/mL — ABNORMAL HIGH (ref 0.0–100.0)

## 2021-03-01 DIAGNOSIS — I2510 Atherosclerotic heart disease of native coronary artery without angina pectoris: Secondary | ICD-10-CM | POA: Diagnosis not present

## 2021-03-01 DIAGNOSIS — I129 Hypertensive chronic kidney disease with stage 1 through stage 4 chronic kidney disease, or unspecified chronic kidney disease: Secondary | ICD-10-CM | POA: Diagnosis not present

## 2021-03-01 DIAGNOSIS — N1831 Chronic kidney disease, stage 3a: Secondary | ICD-10-CM | POA: Diagnosis not present

## 2021-03-01 DIAGNOSIS — K922 Gastrointestinal hemorrhage, unspecified: Secondary | ICD-10-CM | POA: Diagnosis not present

## 2021-03-01 DIAGNOSIS — N179 Acute kidney failure, unspecified: Secondary | ICD-10-CM | POA: Diagnosis not present

## 2021-03-01 DIAGNOSIS — D62 Acute posthemorrhagic anemia: Secondary | ICD-10-CM | POA: Diagnosis not present

## 2021-03-07 DIAGNOSIS — I2510 Atherosclerotic heart disease of native coronary artery without angina pectoris: Secondary | ICD-10-CM | POA: Diagnosis not present

## 2021-03-07 DIAGNOSIS — D62 Acute posthemorrhagic anemia: Secondary | ICD-10-CM | POA: Diagnosis not present

## 2021-03-07 DIAGNOSIS — N179 Acute kidney failure, unspecified: Secondary | ICD-10-CM | POA: Diagnosis not present

## 2021-03-07 DIAGNOSIS — N1831 Chronic kidney disease, stage 3a: Secondary | ICD-10-CM | POA: Diagnosis not present

## 2021-03-07 DIAGNOSIS — I129 Hypertensive chronic kidney disease with stage 1 through stage 4 chronic kidney disease, or unspecified chronic kidney disease: Secondary | ICD-10-CM | POA: Diagnosis not present

## 2021-03-07 DIAGNOSIS — K922 Gastrointestinal hemorrhage, unspecified: Secondary | ICD-10-CM | POA: Diagnosis not present

## 2021-03-08 NOTE — Progress Notes (Deleted)
Referring Provider: Claretta Fraise, MD Primary Care Physician:  Claretta Fraise, MD Primary GI Physician: Dr. Abbey Chatters  No chief complaint on file.   HPI:   Jon Reid is a 87 y.o. male presenting today for ***  History of IDA, upper GI bleed with melena and coffee-ground emesis in the setting of Celebrex, admitted in April 2022 with symptomatic anemia s/p EGD revealing large hiatal hernia, LA grade D esophagitis without bleeding.  He received 3 unit PRBCs during that admission.  Underwent repeat EGD in July 2022 with nonsevere esophagitis s/p cells for cytology obtained, gastritis, large hiatal hernia.  KOH prep was positive for yeast.  No documentation the patient was notified or treated for this.  He was last seen in our office in June 2022 prior to scheduling EGD.  At that time, he was clinically doing very well with no recurrent overt GI bleeding.  He was taking Protonix 40 mg twice daily and iron.  He did report some dysphagia to apples and cornbread.  Also reported chronic constipation, skipping up to 4 to 5 days between bowel movements.  Advised to continue PPI twice daily, iron daily, start MiraLAX daily.  Patient was admitted in August and found to have right hip fracture s/p hip hemiarthroplasty.  He was admitted again with 11/23-11/26 at [facility] with acute on chronic anemia with coffee-ground emesis and darker stool than normal, AKI, hypotension, hypokalemia, and elevated troponins.  Hemoglobin was 7.9.  He received 1 unit PRBCs and hemoglobin remained stable.  He had no further overt GI bleeding throughout admission.  GI saw patient while admitted and opted to hold off on repeat EGD since he had one in April and July.  Recommended outpatient follow-up with GI, PPI BID.  Hemoglobin was 9.9 on day of discharge.  Regarding elevated troponins, though they were quite elevated, patient was asymptomatic and this was felt to be secondary to demand ischemia.     Today: Most recent labs on  12/8 with hemoglobin 10.5, Cr 1.41, BUN 22.   Iron:  NSAIDs:   ?prior colonoscopy:    Likely needs to continue PPI twice daily.  He was previously on PPI daily when he was diagnosed with severe esophagitis.  Also needs treatment for Candida esophagitis. Needs H pylori IgG Repeat CBC ?repeat EGD  Past Medical History:  Diagnosis Date   Arthritis    Carcinoma of prostate ([place])    prostate   CKD (chronic kidney disease), stage III ([place])    Coronary artery disease    a. 10/2012 Cath: LM 20-30d, lAD 80p, 79m, 80-90d, LCX 90-45m, OM1 90, RCA 50p, 68m, 50d, RPDA 90, EF nl; b. 10/2012 CABG x 4 (LIMA-LAD, SVG-OM1, SVG-PDA->RPL); c. 07/2015 MV: Basal and proximal septal infarct w/o ischemia. EF 57%-->low risk.   First degree AV block 01/28/2019   Noted on EKG   GERD (gastroesophageal reflux disease)    Gout    HTN (hypertension)    Macrocytosis without anemia 01/08/2012   Moderate aortic stenosis    a. 10/2012 Echo: EF 55-60%, no rwma, BAE, mild AI, mild TR; b. 06/2020 Echo: EF 50-55%, no rwma, mild LVH. Mod dil LA. Mild MR. Mod AS.   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   Sleep apnea    Questionable   Spinal stenosis    Stroke [facility])     Past Surgical History:  Procedure Laterality  Date   BALLOON DILATION N/A 10/03/2020   Procedure: BALLOON DILATION;  Surgeon: [name], DO;  [facility];  Service: Endoscopy;  Laterality: N/A;   CARDIAC CATHETERIZATION  11/07/2012   Dr Acie Fredrickson   CATARACT EXTRACTION W/ INTRAOCULAR LENS IMPLANT Bilateral    CORONARY ARTERY BYPASS GRAFT N/A 11/11/2012   Procedure: CORONARY ARTERY BYPASS GRAFTING times four using Right Greater Saphenous Vein Graft harvested endoscopically and Left Internal Mammary Artery.;  Surgeon: Ivin Poot, MD;  Location: [place];  Service: Open Heart Surgery;  Laterality: N/A;   ESOPHAGEAL BRUSHING  10/03/2020   Procedure: ESOPHAGEAL  BRUSHING;  Surgeon: [name], DO;  [facility];  Service: Endoscopy;;   ESOPHAGOGASTRODUODENOSCOPY (EGD) WITH PROPOFOL N/A 06/27/2020   Surgeon: [name], DO;  large hiatal hernia, LA grade D esophagitis with no bleeding, normal examined duodenum.    ESOPHAGOGASTRODUODENOSCOPY (EGD) WITH PROPOFOL N/A 10/03/2020   Procedure: ESOPHAGOGASTRODUODENOSCOPY (EGD) WITH PROPOFOL;  Surgeon: [name], DO;  [facility];  Service: Endoscopy;  Laterality: N/A;  2:00pm   HIP ARTHROPLASTY Right 11/13/2020   Procedure: ARTHROPLASTY BIPOLAR HIP (HEMIARTHROPLASTY);  Surgeon: Renette Butters, MD;  Location: [place];  Service: Orthopedics;  Laterality: Right;   INGUINAL HERNIA REPAIR Right 01/10/2017   Procedure: OPEN RIGHT HERNIA REPAIR INGUINAL;  Surgeon: Ileana Roup, MD;  [facility];  Service: General;  Laterality: Right;   INSERTION OF MESH Right 01/10/2017   Procedure: INSERTION OF MESH;  Surgeon: Ileana Roup, MD;  [facility];  Service: General;  Laterality: Right;   INTRAOPERATIVE TRANSESOPHAGEAL ECHOCARDIOGRAM N/A 11/11/2012   Procedure: INTRAOPERATIVE TRANSESOPHAGEAL ECHOCARDIOGRAM;  Surgeon: Ivin Poot, MD;  Location: [place];  Service: Open Heart Surgery;  Laterality: N/A;   LEFT HEART CATHETERIZATION WITH CORONARY ANGIOGRAM N/A 11/07/2012   Procedure: LEFT HEART CATHETERIZATION WITH CORONARY ANGIOGRAM;  Surgeon: Thayer Headings, MD;  Location: [facility] C[facility] LAB;  Service: Cardiovascular;  Laterality: N/A;   LUMBAR LAMINECTOMY/DECOMPRESSION MICRODISCECTOMY N/A 02/05/2019   Procedure: LUMBAR LAMINECTOMY/DECOMPRESSION L3-L4;  Surgeon: Latanya Maudlin, MD;  [facility];  Service: Orthopedics;  Laterality: N/A;  56min   LYMPH NODE DISSECTION     Bilateral pelvic   RETROPUBIC PROSTATECTOMY     Radical    Current Outpatient Medications  Medication Sig Dispense Refill   ascorbic acid (VITAMIN C) 500 MG tablet Take 1 tablet (500 mg  total) by mouth daily. 30 tablet 1   aspirin 81 MG EC tablet Take 1 tablet (81 mg total) by mouth daily. Swallow whole. 30 tablet 0   carbidopa-levodopa (SINEMET IR) 25-100 MG tablet Take 1.5 tablets by mouth 3 (three) times daily. (Patient taking differently: Take 1.5 tablets by mouth 2 (two) times daily.) 405 tablet 3   Cholecalciferol (VITAMIN D) 2000 units CAPS Take 2,000 Units by mouth daily.     docusate sodium (COLACE) 100 MG capsule Take 1 capsule (100 mg total) by mouth 2 (two) times daily. 60 capsule 0   fluticasone (FLONASE) 50 MCG/ACT nasal spray One to 2 sprays each nostril at bedtime (Patient taking differently: Place 2 sprays into both nostrils daily as needed for allergies.) 16 g 6   furosemide (LASIX) 40 MG tablet Take 1 tablet (40 mg total) by mouth 2 (two) times daily. One at breakfast. The other with lunch (Patient taking differently: Take 40 mg by mouth daily. One at breakfast.) 60 tablet 2   guaiFENesin (MUCINEX) 600 MG 12 hr  tablet Take 600 mg by mouth 2 (two) times daily as needed for cough.     Iron, Ferrous Sulfate, 325 (65 Fe) MG TABS Take 1 tablet by mouth daily. (Patient taking differently: Take 325 mg by mouth daily.) 30 tablet 3   melatonin 5 MG TABS Take 1 tablet (5 mg total) by mouth at bedtime. 30 tablet 0   Multiple Vitamins-Minerals (CERTAVITE/ANTIOXIDANTS) TABS Take 1 tablet by mouth daily. 30 tablet 0   nitroGLYCERIN (NITROSTAT) 0.4 MG SL tablet Place 1 tablet (0.4 mg total) under the tongue every 5 (five) minutes as needed for chest pain. 25 tablet PRN   pantoprazole (PROTONIX) 40 MG tablet Take 1 tablet (40 mg total) by mouth 2 (two) times daily. 60 tablet 0   potassium chloride SA (KLOR-CON) 10 MEQ tablet Take 2 tablets (20 mEq total) by mouth daily. For potassium replacement/ supplement 180 tablet 0   senna-docusate (SENOKOT-S) 8.6-50 MG tablet Take 2 tablets by mouth daily with supper. 60 tablet 0   triamcinolone cream (KENALOG) 0.1 % Apply 1 application  topically daily as needed (itching).     No current facility-administered medications for this visit.    Allergies as of 03/09/2021 - Review Complete 02/23/2021  Allergen Reaction Noted   Crestor [rosuvastatin] Swelling 09/03/2013   Trazodone and nefazodone  12/07/2020    Family History  Problem Relation Age of Onset   Aneurysm Father        Cerebral   Stroke Father    Hypertension Father    Cancer Mother        colon   Parkinson's disease Son     Social History   Socioeconomic History   Marital status: Married    Spouse name: [name]   Number of children: 3   Years of education: 12   Highest education level: High school graduate  Occupational History   Occupation: Retired  Tobacco Use   Smoking status: Never   Smokeless tobacco: Never  Scientific laboratory technician Use: Never used  Substance and Sexual Activity   Alcohol use: Yes    Comment: Rare   Drug use: No   Sexual activity: Not Currently  Other Topics Concern   Not on file  Social History Narrative   Lives at home with wife/sons will stay with them occassionally   Married with 3 children   Denies caffeine use    Social Determinants of Radio broadcast assistant Strain: Not on file  Food Insecurity: Not on file  Transportation Needs: Not on file  Physical Activity: Not on file  Stress: Not on file  Social Connections: Not on file    Review of Systems: Gen: Denies fever, chills, anorexia. Denies fatigue, weakness, weight loss.  CV: Denies chest pain, palpitations, syncope, peripheral edema, and claudication. Resp: Denies dyspnea at rest, cough, wheezing, coughing up blood, and pleurisy. GI: Denies vomiting blood, jaundice, and fecal incontinence.   Denies dysphagia or odynophagia. Derm: Denies rash, itching, dry skin Psych: Denies depression, anxiety, memory loss, confusion. No homicidal or suicidal ideation.  Heme: Denies bruising, bleeding, and enlarged lymph nodes.  Physical Exam: There were no vitals  taken for this visit. General:   Alert and oriented. No distress noted. Pleasant and cooperative.  Head:  Normocephalic and atraumatic. Eyes:  Conjuctiva clear without scleral icterus. Mouth:  Oral mucosa pink and moist. Good dentition. No lesions. Heart:  S1, S2 present without murmurs appreciated. Lungs:  Clear to auscultation bilaterally. No wheezes, rales, or rhonchi.  No distress.  Abdomen:  +BS, soft, non-tender and non-distended. No rebound or guarding. No HSM or masses noted. Msk:  Symmetrical without gross deformities. Normal posture. Extremities:  Without edema. Neurologic:  Alert and  oriented x4 Psych:  Alert and cooperative. Normal mood and affect.

## 2021-03-09 ENCOUNTER — Ambulatory Visit: Payer: MEDICARE | Admitting: Gastroenterology

## 2021-03-09 ENCOUNTER — Encounter: Payer: Self-pay | Admitting: Internal Medicine

## 2021-03-09 ENCOUNTER — Telehealth: Payer: Self-pay | Admitting: Gastroenterology

## 2021-03-09 DIAGNOSIS — N1831 Chronic kidney disease, stage 3a: Secondary | ICD-10-CM | POA: Diagnosis not present

## 2021-03-09 DIAGNOSIS — K922 Gastrointestinal hemorrhage, unspecified: Secondary | ICD-10-CM | POA: Diagnosis not present

## 2021-03-09 DIAGNOSIS — I129 Hypertensive chronic kidney disease with stage 1 through stage 4 chronic kidney disease, or unspecified chronic kidney disease: Secondary | ICD-10-CM | POA: Diagnosis not present

## 2021-03-09 DIAGNOSIS — N179 Acute kidney failure, unspecified: Secondary | ICD-10-CM | POA: Diagnosis not present

## 2021-03-09 DIAGNOSIS — D62 Acute posthemorrhagic anemia: Secondary | ICD-10-CM | POA: Diagnosis not present

## 2021-03-09 DIAGNOSIS — I2510 Atherosclerotic heart disease of native coronary artery without angina pectoris: Secondary | ICD-10-CM | POA: Diagnosis not present

## 2021-03-09 NOTE — Telephone Encounter (Signed)
Jon Reid, can we reach out to this patient to go ahead and get him rescheduled?  He was to see me in the office today and unfortunately no showed.  He has a few loose ends and we definitely need to see him in the office.  Would like to get him back in within the next month or so.  Would be okay to schedule with Dr. Abbey Chatters as he likely has openings much sooner than an APP would.   Thanks!

## 2021-03-13 ENCOUNTER — Encounter (HOSPITAL_COMMUNITY): Admission: EM | Disposition: E | Payer: Self-pay | Source: Home / Self Care | Attending: Internal Medicine

## 2021-03-13 ENCOUNTER — Inpatient Hospital Stay (HOSPITAL_COMMUNITY): Payer: MEDICARE

## 2021-03-13 ENCOUNTER — Encounter (HOSPITAL_COMMUNITY): Payer: Self-pay | Admitting: Pulmonary Disease

## 2021-03-13 ENCOUNTER — Inpatient Hospital Stay (HOSPITAL_COMMUNITY)
Admission: EM | Admit: 2021-03-13 | Discharge: 2021-04-19 | DRG: 326 | Disposition: E | Payer: MEDICARE | Attending: Internal Medicine | Admitting: Internal Medicine

## 2021-03-13 ENCOUNTER — Other Ambulatory Visit: Payer: Self-pay

## 2021-03-13 ENCOUNTER — Inpatient Hospital Stay (HOSPITAL_COMMUNITY): Payer: MEDICARE | Admitting: Anesthesiology

## 2021-03-13 DIAGNOSIS — I130 Hypertensive heart and chronic kidney disease with heart failure and stage 1 through stage 4 chronic kidney disease, or unspecified chronic kidney disease: Secondary | ICD-10-CM | POA: Diagnosis not present

## 2021-03-13 DIAGNOSIS — Z6824 Body mass index (BMI) 24.0-24.9, adult: Secondary | ICD-10-CM | POA: Diagnosis not present

## 2021-03-13 DIAGNOSIS — K920 Hematemesis: Secondary | ICD-10-CM | POA: Diagnosis present

## 2021-03-13 DIAGNOSIS — L8990 Pressure ulcer of unspecified site, unspecified stage: Secondary | ICD-10-CM

## 2021-03-13 DIAGNOSIS — Z888 Allergy status to other drugs, medicaments and biological substances status: Secondary | ICD-10-CM

## 2021-03-13 DIAGNOSIS — R0902 Hypoxemia: Secondary | ICD-10-CM | POA: Diagnosis not present

## 2021-03-13 DIAGNOSIS — J156 Pneumonia due to other aerobic Gram-negative bacteria: Secondary | ICD-10-CM | POA: Diagnosis not present

## 2021-03-13 DIAGNOSIS — Z452 Encounter for adjustment and management of vascular access device: Secondary | ICD-10-CM

## 2021-03-13 DIAGNOSIS — R578 Other shock: Secondary | ICD-10-CM | POA: Diagnosis not present

## 2021-03-13 DIAGNOSIS — Z66 Do not resuscitate: Secondary | ICD-10-CM | POA: Diagnosis present

## 2021-03-13 DIAGNOSIS — K2289 Other specified disease of esophagus: Secondary | ICD-10-CM | POA: Diagnosis not present

## 2021-03-13 DIAGNOSIS — N170 Acute kidney failure with tubular necrosis: Secondary | ICD-10-CM | POA: Diagnosis not present

## 2021-03-13 DIAGNOSIS — R6521 Severe sepsis with septic shock: Secondary | ICD-10-CM | POA: Diagnosis not present

## 2021-03-13 DIAGNOSIS — R269 Unspecified abnormalities of gait and mobility: Secondary | ICD-10-CM | POA: Diagnosis not present

## 2021-03-13 DIAGNOSIS — K117 Disturbances of salivary secretion: Secondary | ICD-10-CM | POA: Diagnosis not present

## 2021-03-13 DIAGNOSIS — J690 Pneumonitis due to inhalation of food and vomit: Secondary | ICD-10-CM | POA: Diagnosis present

## 2021-03-13 DIAGNOSIS — H02102 Unspecified ectropion of right lower eyelid: Secondary | ICD-10-CM | POA: Diagnosis present

## 2021-03-13 DIAGNOSIS — Z8719 Personal history of other diseases of the digestive system: Secondary | ICD-10-CM

## 2021-03-13 DIAGNOSIS — A419 Sepsis, unspecified organism: Principal | ICD-10-CM | POA: Diagnosis not present

## 2021-03-13 DIAGNOSIS — N2889 Other specified disorders of kidney and ureter: Secondary | ICD-10-CM | POA: Diagnosis present

## 2021-03-13 DIAGNOSIS — K922 Gastrointestinal hemorrhage, unspecified: Secondary | ICD-10-CM | POA: Diagnosis not present

## 2021-03-13 DIAGNOSIS — J9690 Respiratory failure, unspecified, unspecified whether with hypoxia or hypercapnia: Secondary | ICD-10-CM | POA: Diagnosis not present

## 2021-03-13 DIAGNOSIS — K921 Melena: Secondary | ICD-10-CM

## 2021-03-13 DIAGNOSIS — L89621 Pressure ulcer of left heel, stage 1: Secondary | ICD-10-CM | POA: Diagnosis present

## 2021-03-13 DIAGNOSIS — E873 Alkalosis: Secondary | ICD-10-CM | POA: Diagnosis not present

## 2021-03-13 DIAGNOSIS — E43 Unspecified severe protein-calorie malnutrition: Secondary | ICD-10-CM | POA: Diagnosis not present

## 2021-03-13 DIAGNOSIS — L89611 Pressure ulcer of right heel, stage 1: Secondary | ICD-10-CM | POA: Diagnosis present

## 2021-03-13 DIAGNOSIS — K264 Chronic or unspecified duodenal ulcer with hemorrhage: Secondary | ICD-10-CM | POA: Diagnosis not present

## 2021-03-13 DIAGNOSIS — I2510 Atherosclerotic heart disease of native coronary artery without angina pectoris: Secondary | ICD-10-CM | POA: Diagnosis present

## 2021-03-13 DIAGNOSIS — K219 Gastro-esophageal reflux disease without esophagitis: Secondary | ICD-10-CM | POA: Diagnosis present

## 2021-03-13 DIAGNOSIS — D62 Acute posthemorrhagic anemia: Secondary | ICD-10-CM | POA: Diagnosis present

## 2021-03-13 DIAGNOSIS — Z9289 Personal history of other medical treatment: Secondary | ICD-10-CM | POA: Diagnosis not present

## 2021-03-13 DIAGNOSIS — Z978 Presence of other specified devices: Secondary | ICD-10-CM | POA: Diagnosis not present

## 2021-03-13 DIAGNOSIS — J90 Pleural effusion, not elsewhere classified: Secondary | ICD-10-CM | POA: Diagnosis not present

## 2021-03-13 DIAGNOSIS — E870 Hyperosmolality and hypernatremia: Secondary | ICD-10-CM | POA: Diagnosis not present

## 2021-03-13 DIAGNOSIS — J9811 Atelectasis: Secondary | ICD-10-CM | POA: Diagnosis not present

## 2021-03-13 DIAGNOSIS — R339 Retention of urine, unspecified: Secondary | ICD-10-CM | POA: Diagnosis not present

## 2021-03-13 DIAGNOSIS — R34 Anuria and oliguria: Secondary | ICD-10-CM | POA: Diagnosis not present

## 2021-03-13 DIAGNOSIS — K449 Diaphragmatic hernia without obstruction or gangrene: Secondary | ICD-10-CM | POA: Diagnosis present

## 2021-03-13 DIAGNOSIS — Z820 Family history of epilepsy and other diseases of the nervous system: Secondary | ICD-10-CM

## 2021-03-13 DIAGNOSIS — D696 Thrombocytopenia, unspecified: Secondary | ICD-10-CM | POA: Diagnosis not present

## 2021-03-13 DIAGNOSIS — I959 Hypotension, unspecified: Secondary | ICD-10-CM | POA: Diagnosis not present

## 2021-03-13 DIAGNOSIS — I10 Essential (primary) hypertension: Secondary | ICD-10-CM | POA: Diagnosis present

## 2021-03-13 DIAGNOSIS — H02105 Unspecified ectropion of left lower eyelid: Secondary | ICD-10-CM

## 2021-03-13 DIAGNOSIS — K2101 Gastro-esophageal reflux disease with esophagitis, with bleeding: Secondary | ICD-10-CM | POA: Diagnosis not present

## 2021-03-13 DIAGNOSIS — J9601 Acute respiratory failure with hypoxia: Secondary | ICD-10-CM | POA: Diagnosis not present

## 2021-03-13 DIAGNOSIS — R4182 Altered mental status, unspecified: Secondary | ICD-10-CM | POA: Diagnosis not present

## 2021-03-13 DIAGNOSIS — R531 Weakness: Secondary | ICD-10-CM | POA: Diagnosis not present

## 2021-03-13 DIAGNOSIS — Z515 Encounter for palliative care: Secondary | ICD-10-CM | POA: Diagnosis not present

## 2021-03-13 DIAGNOSIS — Z823 Family history of stroke: Secondary | ICD-10-CM

## 2021-03-13 DIAGNOSIS — E876 Hypokalemia: Secondary | ICD-10-CM | POA: Diagnosis not present

## 2021-03-13 DIAGNOSIS — Z800 Family history of malignant neoplasm of digestive organs: Secondary | ICD-10-CM

## 2021-03-13 DIAGNOSIS — N1831 Chronic kidney disease, stage 3a: Secondary | ICD-10-CM | POA: Diagnosis not present

## 2021-03-13 DIAGNOSIS — R54 Age-related physical debility: Secondary | ICD-10-CM | POA: Diagnosis present

## 2021-03-13 DIAGNOSIS — Z951 Presence of aortocoronary bypass graft: Secondary | ICD-10-CM

## 2021-03-13 DIAGNOSIS — Z8673 Personal history of transient ischemic attack (TIA), and cerebral infarction without residual deficits: Secondary | ICD-10-CM

## 2021-03-13 DIAGNOSIS — Z7982 Long term (current) use of aspirin: Secondary | ICD-10-CM | POA: Diagnosis not present

## 2021-03-13 DIAGNOSIS — Z8249 Family history of ischemic heart disease and other diseases of the circulatory system: Secondary | ICD-10-CM

## 2021-03-13 DIAGNOSIS — K2091 Esophagitis, unspecified with bleeding: Secondary | ICD-10-CM | POA: Diagnosis not present

## 2021-03-13 DIAGNOSIS — G4733 Obstructive sleep apnea (adult) (pediatric): Secondary | ICD-10-CM | POA: Diagnosis present

## 2021-03-13 DIAGNOSIS — Z20822 Contact with and (suspected) exposure to covid-19: Secondary | ICD-10-CM | POA: Diagnosis present

## 2021-03-13 DIAGNOSIS — I5032 Chronic diastolic (congestive) heart failure: Secondary | ICD-10-CM | POA: Diagnosis not present

## 2021-03-13 DIAGNOSIS — Z4682 Encounter for fitting and adjustment of non-vascular catheter: Secondary | ICD-10-CM | POA: Diagnosis not present

## 2021-03-13 DIAGNOSIS — Z79899 Other long term (current) drug therapy: Secondary | ICD-10-CM

## 2021-03-13 DIAGNOSIS — R579 Shock, unspecified: Secondary | ICD-10-CM | POA: Diagnosis present

## 2021-03-13 DIAGNOSIS — Z8546 Personal history of malignant neoplasm of prostate: Secondary | ICD-10-CM

## 2021-03-13 DIAGNOSIS — Z885 Allergy status to narcotic agent status: Secondary | ICD-10-CM

## 2021-03-13 DIAGNOSIS — K2100 Gastro-esophageal reflux disease with esophagitis, without bleeding: Secondary | ICD-10-CM | POA: Diagnosis present

## 2021-03-13 DIAGNOSIS — M1990 Unspecified osteoarthritis, unspecified site: Secondary | ICD-10-CM | POA: Diagnosis present

## 2021-03-13 DIAGNOSIS — N179 Acute kidney failure, unspecified: Secondary | ICD-10-CM | POA: Diagnosis not present

## 2021-03-13 DIAGNOSIS — L89301 Pressure ulcer of unspecified buttock, stage 1: Secondary | ICD-10-CM | POA: Diagnosis present

## 2021-03-13 DIAGNOSIS — N183 Chronic kidney disease, stage 3 unspecified: Secondary | ICD-10-CM | POA: Diagnosis present

## 2021-03-13 DIAGNOSIS — K226 Gastro-esophageal laceration-hemorrhage syndrome: Principal | ICD-10-CM | POA: Diagnosis present

## 2021-03-13 DIAGNOSIS — H02109 Unspecified ectropion of unspecified eye, unspecified eyelid: Secondary | ICD-10-CM | POA: Insufficient documentation

## 2021-03-13 DIAGNOSIS — R1310 Dysphagia, unspecified: Secondary | ICD-10-CM | POA: Diagnosis not present

## 2021-03-13 HISTORY — PX: ESOPHAGOGASTRODUODENOSCOPY: SHX5428

## 2021-03-13 LAB — POCT I-STAT 7, (LYTES, BLD GAS, ICA,H+H)
Acid-Base Excess: 14 mmol/L — ABNORMAL HIGH (ref 0.0–2.0)
Acid-Base Excess: 15 mmol/L — ABNORMAL HIGH (ref 0.0–2.0)
Acid-Base Excess: 18 mmol/L — ABNORMAL HIGH (ref 0.0–2.0)
Bicarbonate: 36.7 mmol/L — ABNORMAL HIGH (ref 20.0–28.0)
Bicarbonate: 39.5 mmol/L — ABNORMAL HIGH (ref 20.0–28.0)
Bicarbonate: 41.4 mmol/L — ABNORMAL HIGH (ref 20.0–28.0)
Calcium, Ion: 0.93 mmol/L — ABNORMAL LOW (ref 1.15–1.40)
Calcium, Ion: 0.96 mmol/L — ABNORMAL LOW (ref 1.15–1.40)
Calcium, Ion: 0.98 mmol/L — ABNORMAL LOW (ref 1.15–1.40)
HCT: 27 % — ABNORMAL LOW (ref 39.0–52.0)
HCT: 28 % — ABNORMAL LOW (ref 39.0–52.0)
HCT: 28 % — ABNORMAL LOW (ref 39.0–52.0)
Hemoglobin: 9.2 g/dL — ABNORMAL LOW (ref 13.0–17.0)
Hemoglobin: 9.5 g/dL — ABNORMAL LOW (ref 13.0–17.0)
Hemoglobin: 9.5 g/dL — ABNORMAL LOW (ref 13.0–17.0)
O2 Saturation: 88 %
O2 Saturation: 100 %
O2 Saturation: 100 %
Patient temperature: 36
Patient temperature: 98.4
Potassium: 3.4 mmol/L — ABNORMAL LOW (ref 3.5–5.1)
Potassium: 3.5 mmol/L (ref 3.5–5.1)
Potassium: 3.4 mmol/L — ABNORMAL LOW (ref 3.5–5.1)
Sodium: 145 mmol/L (ref 135–145)
Sodium: 145 mmol/L (ref 135–145)
Sodium: 143 mmol/L (ref 135–145)
TCO2: 38 mmol/L — ABNORMAL HIGH (ref 22–32)
TCO2: 41 mmol/L — ABNORMAL HIGH (ref 22–32)
TCO2: 43 mmol/L — ABNORMAL HIGH (ref 22–32)
pCO2 arterial: 36.4 mmHg (ref 32.0–48.0)
pCO2 arterial: 50.3 mmHg — ABNORMAL HIGH (ref 32.0–48.0)
pCO2 arterial: 45 mmHg (ref 32.0–48.0)
pH, Arterial: 7.609 (ref 7.350–7.450)
pH, Arterial: 7.503 — ABNORMAL HIGH (ref 7.350–7.450)
pH, Arterial: 7.572 — ABNORMAL HIGH (ref 7.350–7.450)
pO2, Arterial: 43 mmHg — ABNORMAL LOW (ref 83.0–108.0)
pO2, Arterial: 178 mmHg — ABNORMAL HIGH (ref 83.0–108.0)
pO2, Arterial: 161 mmHg — ABNORMAL HIGH (ref 83.0–108.0)

## 2021-03-13 LAB — COMPREHENSIVE METABOLIC PANEL
ALT: 15 U/L (ref 0–44)
AST: 30 U/L (ref 15–41)
Albumin: 4.1 g/dL (ref 3.5–5.0)
Alkaline Phosphatase: 94 U/L (ref 38–126)
Anion gap: 15 (ref 5–15)
BUN: 24 mg/dL — ABNORMAL HIGH (ref 8–23)
CO2: 28 mmol/L (ref 22–32)
Calcium: 9.3 mg/dL (ref 8.9–10.3)
Chloride: 101 mmol/L (ref 98–111)
Creatinine, Ser: 2.08 mg/dL — ABNORMAL HIGH (ref 0.61–1.24)
GFR, Estimated: 30 mL/min — ABNORMAL LOW (ref >60)
Glucose, Bld: 160 mg/dL — ABNORMAL HIGH (ref 70–99)
Potassium: 3.2 mmol/L — ABNORMAL LOW (ref 3.5–5.1)
Sodium: 144 mmol/L (ref 135–145)
Total Bilirubin: 0.7 mg/dL (ref 0.3–1.2)
Total Protein: 7.6 g/dL (ref 6.5–8.1)

## 2021-03-13 LAB — CBC WITH DIFFERENTIAL/PLATELET
Abs Immature Granulocytes: 0.02 10*3/uL (ref 0.00–0.07)
Basophils Absolute: 0 10*3/uL (ref 0.0–0.1)
Basophils Relative: 0 %
Eosinophils Absolute: 0 10*3/uL (ref 0.0–0.5)
Eosinophils Relative: 0 %
HCT: 29.3 % — ABNORMAL LOW (ref 39.0–52.0)
Hemoglobin: 9.6 g/dL — ABNORMAL LOW (ref 13.0–17.0)
Immature Granulocytes: 0 %
Lymphocytes Relative: 6 %
Lymphs Abs: 0.6 10*3/uL — ABNORMAL LOW (ref 0.7–4.0)
MCH: 31.2 pg (ref 26.0–34.0)
MCHC: 32.8 g/dL (ref 30.0–36.0)
MCV: 95.1 fL (ref 80.0–100.0)
Monocytes Absolute: 0.7 10*3/uL (ref 0.1–1.0)
Monocytes Relative: 7 %
Neutro Abs: 8.3 10*3/uL — ABNORMAL HIGH (ref 1.7–7.7)
Neutrophils Relative %: 87 %
Platelets: 264 10*3/uL (ref 150–400)
RBC: 3.08 MIL/uL — ABNORMAL LOW (ref 4.22–5.81)
RDW: 18.6 % — ABNORMAL HIGH (ref 11.5–15.5)
WBC: 9.6 10*3/uL (ref 4.0–10.5)
nRBC: 0 % (ref 0.0–0.2)

## 2021-03-13 LAB — BASIC METABOLIC PANEL
Anion gap: 17 — ABNORMAL HIGH (ref 5–15)
BUN: 40 mg/dL — ABNORMAL HIGH (ref 8–23)
CO2: 35 mmol/L — ABNORMAL HIGH (ref 22–32)
Calcium: 8.3 mg/dL — ABNORMAL LOW (ref 8.9–10.3)
Chloride: 94 mmol/L — ABNORMAL LOW (ref 98–111)
Creatinine, Ser: 3.37 mg/dL — ABNORMAL HIGH (ref 0.61–1.24)
GFR, Estimated: 17 mL/min — ABNORMAL LOW (ref >60)
Glucose, Bld: 146 mg/dL — ABNORMAL HIGH (ref 70–99)
Potassium: 3.6 mmol/L (ref 3.5–5.1)
Sodium: 146 mmol/L — ABNORMAL HIGH (ref 135–145)

## 2021-03-13 LAB — CBC
HCT: 34.3 % — ABNORMAL LOW (ref 39.0–52.0)
HCT: 31.7 % — ABNORMAL LOW (ref 39.0–52.0)
HCT: 28.9 % — ABNORMAL LOW (ref 39.0–52.0)
Hemoglobin: 10.8 g/dL — ABNORMAL LOW (ref 13.0–17.0)
Hemoglobin: 10.2 g/dL — ABNORMAL LOW (ref 13.0–17.0)
Hemoglobin: 9.3 g/dL — ABNORMAL LOW (ref 13.0–17.0)
MCH: 31.4 pg (ref 26.0–34.0)
MCH: 31.3 pg (ref 26.0–34.0)
MCH: 31.5 pg (ref 26.0–34.0)
MCHC: 31.5 g/dL (ref 30.0–36.0)
MCHC: 32.2 g/dL (ref 30.0–36.0)
MCHC: 32.2 g/dL (ref 30.0–36.0)
MCV: 99.7 fL (ref 80.0–100.0)
MCV: 97.2 fL (ref 80.0–100.0)
MCV: 98 fL (ref 80.0–100.0)
Platelets: 338 10*3/uL (ref 150–400)
Platelets: 320 10*3/uL (ref 150–400)
Platelets: 327 10*3/uL (ref 150–400)
RBC: 3.44 MIL/uL — ABNORMAL LOW (ref 4.22–5.81)
RBC: 3.26 MIL/uL — ABNORMAL LOW (ref 4.22–5.81)
RBC: 2.95 MIL/uL — ABNORMAL LOW (ref 4.22–5.81)
RDW: 17.3 % — ABNORMAL HIGH (ref 11.5–15.5)
RDW: 17.4 % — ABNORMAL HIGH (ref 11.5–15.5)
RDW: 18 % — ABNORMAL HIGH (ref 11.5–15.5)
WBC: 12.7 10*3/uL — ABNORMAL HIGH (ref 4.0–10.5)
WBC: 10.5 10*3/uL (ref 4.0–10.5)
WBC: 14 10*3/uL — ABNORMAL HIGH (ref 4.0–10.5)
nRBC: 0 % (ref 0.0–0.2)
nRBC: 0 % (ref 0.0–0.2)
nRBC: 0 % (ref 0.0–0.2)

## 2021-03-13 LAB — RESP PANEL BY RT-PCR (FLU A&B, COVID) ARPGX2
Influenza A by PCR: NEGATIVE
Influenza B by PCR: NEGATIVE
SARS Coronavirus 2 by RT PCR: NEGATIVE

## 2021-03-13 LAB — GLUCOSE, CAPILLARY
Glucose-Capillary: 119 mg/dL — ABNORMAL HIGH (ref 70–99)
Glucose-Capillary: 159 mg/dL — ABNORMAL HIGH (ref 70–99)

## 2021-03-13 LAB — PROTIME-INR
INR: 1.1 (ref 0.8–1.2)
Prothrombin Time: 14.4 seconds (ref 11.4–15.2)

## 2021-03-13 LAB — MRSA NEXT GEN BY PCR, NASAL: MRSA by PCR Next Gen: NOT DETECTED

## 2021-03-13 LAB — LACTIC ACID, PLASMA: Lactic Acid, Venous: 5.1 mmol/L (ref 0.5–1.9)

## 2021-03-13 LAB — PREPARE RBC (CROSSMATCH)

## 2021-03-13 LAB — POC OCCULT BLOOD, ED: Fecal Occult Bld: POSITIVE — AB

## 2021-03-13 SURGERY — EGD (ESOPHAGOGASTRODUODENOSCOPY)
Anesthesia: General | Laterality: Left

## 2021-03-13 MED ORDER — ACETAMINOPHEN 650 MG RE SUPP: 650.0000 mg | Freq: Four times a day (QID) | RECTAL | Status: DC | PRN

## 2021-03-13 MED ORDER — ONDANSETRON HCL 4 MG/2ML IJ SOLN
4.0000 mg | Freq: Once | INTRAMUSCULAR | Status: DC
  Filled 2021-03-13: qty 2

## 2021-03-13 MED ORDER — LACTATED RINGERS IV SOLN: INTRAVENOUS | Status: DC

## 2021-03-13 MED ORDER — PANTOPRAZOLE SODIUM 40 MG IV SOLR
40.0000 mg | Freq: Two times a day (BID) | INTRAVENOUS | Status: DC
  Administered 2021-03-16 – 2021-03-18 (×5): 40 mg via INTRAVENOUS
  Filled 2021-03-13 (×5): qty 40

## 2021-03-13 MED ORDER — SUCCINYLCHOLINE CHLORIDE 200 MG/10ML IV SOSY
PREFILLED_SYRINGE | INTRAVENOUS | Status: DC | PRN
  Administered 2021-03-13: 100 mg via INTRAVENOUS

## 2021-03-13 MED ORDER — TOBRAMYCIN 0.3 % OP SOLN
1.0000 [drp] | Freq: Two times a day (BID) | OPHTHALMIC | Status: DC
  Administered 2021-03-13 – 2021-03-19 (×13): 1 [drp] via OPHTHALMIC
  Filled 2021-03-13 (×2): qty 5

## 2021-03-13 MED ORDER — ONDANSETRON HCL 4 MG PO TABS: 4.0000 mg | ORAL_TABLET | Freq: Four times a day (QID) | ORAL | Status: DC | PRN

## 2021-03-13 MED ORDER — ALBUMIN HUMAN 5 % IV SOLN: INTRAVENOUS | Status: DC | PRN

## 2021-03-13 MED ORDER — SODIUM CHLORIDE 0.9 % IV SOLN: INTRAVENOUS | Status: DC | PRN

## 2021-03-13 MED ORDER — ACETAMINOPHEN 325 MG PO TABS
650.0000 mg | ORAL_TABLET | Freq: Four times a day (QID) | ORAL | Status: DC | PRN
  Administered 2021-03-16: 18:00:00 650 mg via ORAL
  Filled 2021-03-13: qty 2

## 2021-03-13 MED ORDER — CHLORHEXIDINE GLUCONATE 0.12% ORAL RINSE (MEDLINE KIT)
15.0000 mL | Freq: Two times a day (BID) | OROMUCOSAL | Status: DC
  Administered 2021-03-13 – 2021-03-19 (×10): 15 mL via OROMUCOSAL

## 2021-03-13 MED ORDER — MORPHINE SULFATE (PF) 2 MG/ML IV SOLN: 2.0000 mg | INTRAVENOUS | Status: DC | PRN

## 2021-03-13 MED ORDER — SODIUM CHLORIDE 0.9 % IV SOLN: INTRAVENOUS | Status: DC

## 2021-03-13 MED ORDER — LIDOCAINE HCL (CARDIAC) PF 100 MG/5ML IV SOSY
PREFILLED_SYRINGE | INTRAVENOUS | Status: DC | PRN
  Administered 2021-03-13: 20 mg via INTRAVENOUS

## 2021-03-13 MED ORDER — LACTATED RINGERS IV BOLUS
500.0000 mL | Freq: Once | INTRAVENOUS | Status: AC
  Administered 2021-03-13: 500 mL via INTRAVENOUS

## 2021-03-13 MED ORDER — PROPOFOL 500 MG/50ML IV EMUL
INTRAVENOUS | Status: DC | PRN
  Administered 2021-03-13: 40 ug/kg/min via INTRAVENOUS

## 2021-03-13 MED ORDER — PANTOPRAZOLE SODIUM 40 MG IV SOLR
40.0000 mg | Freq: Once | INTRAVENOUS | Status: AC
  Administered 2021-03-13: 11:00:00 40 mg via INTRAVENOUS
  Filled 2021-03-13: qty 40

## 2021-03-13 MED ORDER — METOCLOPRAMIDE HCL 5 MG/ML IJ SOLN
5.0000 mg | Freq: Once | INTRAMUSCULAR | Status: AC
  Administered 2021-03-13: 15:00:00 5 mg via INTRAVENOUS
  Filled 2021-03-13: qty 2

## 2021-03-13 MED ORDER — ORAL CARE MOUTH RINSE
15.0000 mL | OROMUCOSAL | Status: DC
  Administered 2021-03-13 – 2021-03-19 (×49): 15 mL via OROMUCOSAL

## 2021-03-13 MED ORDER — SODIUM CHLORIDE 0.9% IV SOLUTION: Freq: Once | INTRAVENOUS | Status: DC

## 2021-03-13 MED ORDER — POTASSIUM CHLORIDE 10 MEQ/100ML IV SOLN
10.0000 meq | INTRAVENOUS | Status: DC
  Administered 2021-03-13: 13:00:00 10 meq via INTRAVENOUS
  Filled 2021-03-13 (×3): qty 100

## 2021-03-13 MED ORDER — CHLORHEXIDINE GLUCONATE CLOTH 2 % EX PADS
6.0000 | MEDICATED_PAD | Freq: Every day | CUTANEOUS | Status: DC
  Administered 2021-03-13 – 2021-03-19 (×6): 6 via TOPICAL

## 2021-03-13 MED ORDER — PHENYLEPHRINE HCL-NACL 20-0.9 MG/250ML-% IV SOLN
25.0000 ug/min | INTRAVENOUS | Status: DC
Start: 2021-03-13 — End: 2021-03-15
  Administered 2021-03-13: 22:00:00 25 ug/min via INTRAVENOUS
  Administered 2021-03-14: 01:00:00 90 ug/min via INTRAVENOUS
  Administered 2021-03-14 (×2): 200 ug/min via INTRAVENOUS
  Administered 2021-03-14: 15:00:00 140 ug/min via INTRAVENOUS
  Administered 2021-03-14: 07:00:00 170 ug/min via INTRAVENOUS
  Administered 2021-03-14: 05:00:00 150 ug/min via INTRAVENOUS
  Administered 2021-03-14: 09:00:00 200 ug/min via INTRAVENOUS
  Filled 2021-03-13 (×2): qty 500
  Filled 2021-03-13 (×4): qty 250

## 2021-03-13 MED ORDER — ETOMIDATE 2 MG/ML IV SOLN
INTRAVENOUS | Status: DC | PRN
  Administered 2021-03-13: 20 mg via INTRAVENOUS

## 2021-03-13 MED ORDER — HYDRALAZINE HCL 20 MG/ML IJ SOLN: 5.0000 mg | INTRAMUSCULAR | Status: DC | PRN

## 2021-03-13 MED ORDER — FENTANYL CITRATE PF 50 MCG/ML IJ SOSY
25.0000 ug | PREFILLED_SYRINGE | INTRAMUSCULAR | Status: DC | PRN
  Administered 2021-03-16: 16:00:00 25 ug via INTRAVENOUS
  Filled 2021-03-13 (×2): qty 1

## 2021-03-13 MED ORDER — ONDANSETRON HCL 4 MG/2ML IJ SOLN
4.0000 mg | Freq: Four times a day (QID) | INTRAMUSCULAR | Status: DC | PRN
  Administered 2021-03-13: 17:00:00 4 mg via INTRAVENOUS
  Filled 2021-03-13: qty 2

## 2021-03-13 MED ORDER — CARBIDOPA-LEVODOPA 25-100 MG PO TABS
1.0000 | ORAL_TABLET | Freq: Two times a day (BID) | ORAL | Status: DC
  Administered 2021-03-14: 1 via ORAL
  Filled 2021-03-13 (×3): qty 1

## 2021-03-13 MED ORDER — DOCUSATE SODIUM 50 MG/5ML PO LIQD
100.0000 mg | Freq: Two times a day (BID) | ORAL | Status: DC
  Administered 2021-03-14 – 2021-03-18 (×10): 100 mg
  Filled 2021-03-13 (×12): qty 10

## 2021-03-13 MED ORDER — ROCURONIUM BROMIDE 100 MG/10ML IV SOLN
INTRAVENOUS | Status: DC | PRN
  Administered 2021-03-13: 50 mg via INTRAVENOUS

## 2021-03-13 MED ORDER — PANTOPRAZOLE 80MG IVPB - SIMPLE MED
80.0000 mg | Freq: Once | INTRAVENOUS | Status: AC
  Administered 2021-03-13: 13:00:00 80 mg via INTRAVENOUS
  Filled 2021-03-13: qty 80

## 2021-03-13 MED ORDER — PHENYLEPHRINE HCL (PRESSORS) 10 MG/ML IV SOLN
INTRAVENOUS | Status: DC | PRN
  Administered 2021-03-13: 120 ug via INTRAVENOUS
  Administered 2021-03-13: 80 ug via INTRAVENOUS

## 2021-03-13 MED ORDER — ONDANSETRON HCL 4 MG/2ML IJ SOLN
4.0000 mg | Freq: Once | INTRAMUSCULAR | Status: AC
  Administered 2021-03-13: 11:00:00 4 mg via INTRAVENOUS
  Filled 2021-03-13: qty 2

## 2021-03-13 MED ORDER — POTASSIUM CHLORIDE 10 MEQ/100ML IV SOLN
10.0000 meq | INTRAVENOUS | Status: AC
  Administered 2021-03-13 (×3): 10 meq via INTRAVENOUS
  Filled 2021-03-13: qty 100

## 2021-03-13 MED ORDER — SODIUM CHLORIDE 0.9 % IV SOLN
250.0000 mL | INTRAVENOUS | Status: DC
  Administered 2021-03-14 – 2021-03-15 (×2): 250 mL via INTRAVENOUS

## 2021-03-13 MED ORDER — CARBIDOPA-LEVODOPA 25-100 MG PO TABS: 1.5000 | ORAL_TABLET | Freq: Two times a day (BID) | ORAL | Status: DC

## 2021-03-13 MED ORDER — PANTOPRAZOLE INFUSION (NEW) - SIMPLE MED
8.0000 mg/h | INTRAVENOUS | Status: DC
  Administered 2021-03-13 – 2021-03-14 (×3): 8 mg/h via INTRAVENOUS
  Filled 2021-03-13 (×2): qty 80
  Filled 2021-03-13: qty 100

## 2021-03-13 MED ORDER — FENTANYL CITRATE PF 50 MCG/ML IJ SOSY
25.0000 ug | PREFILLED_SYRINGE | INTRAMUSCULAR | Status: DC | PRN
  Administered 2021-03-15 – 2021-03-16 (×4): 50 ug via INTRAVENOUS
  Filled 2021-03-13 (×4): qty 1

## 2021-03-13 MED ORDER — PROPOFOL 1000 MG/100ML IV EMUL
0.0000 ug/kg/min | INTRAVENOUS | Status: DC
Start: 2021-03-13 — End: 2021-03-17
  Administered 2021-03-13: 22:00:00 40 ug/kg/min via INTRAVENOUS
  Administered 2021-03-14: 02:00:00 20 ug/kg/min via INTRAVENOUS
  Administered 2021-03-15 – 2021-03-16 (×3): 10 ug/kg/min via INTRAVENOUS
  Filled 2021-03-13 (×4): qty 100

## 2021-03-13 MED ORDER — PHENYLEPHRINE HCL-NACL 20-0.9 MG/250ML-% IV SOLN
INTRAVENOUS | Status: DC | PRN
  Administered 2021-03-13: 25 ug/min via INTRAVENOUS

## 2021-03-13 MED ORDER — MELATONIN 5 MG PO TABS
5.0000 mg | ORAL_TABLET | Freq: Every day | ORAL | Status: DC
  Administered 2021-03-14: 5 mg via ORAL
  Filled 2021-03-13 (×2): qty 1

## 2021-03-13 MED ORDER — POLYETHYLENE GLYCOL 3350 17 G PO PACK
17.0000 g | PACK | Freq: Every day | ORAL | Status: DC
  Administered 2021-03-14 – 2021-03-18 (×5): 17 g
  Filled 2021-03-13 (×5): qty 1

## 2021-03-13 NOTE — ED Triage Notes (Signed)
Pt c/o vomiting blood x 2 hours.

## 2021-03-13 NOTE — Assessment & Plan Note (Signed)
-  Stage 3a CKD at baseline, currently with worsening -Will give gentle IVF hydration -Recheck BMP in AM

## 2021-03-13 NOTE — Assessment & Plan Note (Signed)
-  Continue tobramycin drops

## 2021-03-13 NOTE — Assessment & Plan Note (Signed)
-  K+ is 3.2 -Will replete -He has h/o prolonged QTc so will check EKG

## 2021-03-13 NOTE — H&P (Addendum)
History and Physical    Patient: Jon Reid [id number]:[id number] DOB: [birth year] DOA: 03/11/2021 DOS: the patient was seen and examined on 03/12/2021 PCP: Claretta Fraise, MD  Patient coming from: Home - lives with sons; NOK: [place], [place], [phone]  Chief Complaint: Hematemesis  HPI: Jon Reid is a 87 y.o. male with medical history significant of prostate CA; stage 3 CKD; HTN; moderate AS; CAD s/p CABG; and OSA presenting with hematemesis. He has a h/o UGI bleeds and ABLA with grade D esophagitis on EGD in 09/2020; he was last admitted for this from 11/23-26 and was transfused 1 unit PRBC and scheduled for outpatient GI f/u.  He returned overnight with recurrent bleeding.  He reports that he started having bleeding "bad" again yesterday.  He has had bleeding along the way and then it picked up again Saturday evening.  He saw his doctor Friday and he was ok then but "the blood disappearing" and he was thinking he might need a transfusion.  He is having black stools and also started vomiting blood during the middle of the night.  Not having abdominal pain.  Last emesis was earlier this AM.     ED Course:  Coffee ground emesis since yesterday afternoon.  Worsened while in the ER.  No current active vomiting.  On ASA.  [facility] will see (but are very busy - so call sooner if needed).     Review of Systems: ROS negative except as above  Past Medical History:  Diagnosis Date   Arthritis    Carcinoma of prostate ([place])    prostate   CKD (chronic kidney disease), stage III ([facility])    Coronary artery disease    a. 10/2012 Cath: LM 20-30d, lAD 80p, 55m, 80-90d, LCX 90-48m, OM1 90, RCA 50p, 55m, 50d, RPDA 90, EF nl; b. 10/2012 CABG x 4 (LIMA-LAD, SVG-OM1, SVG-PDA->RPL); c. 07/2015 MV: Basal and proximal septal infarct w/o ischemia. EF 57%-->low risk.   First degree AV block 01/28/2019   Noted on EKG   GERD (gastroesophageal reflux disease)    Gout    HTN (hypertension)    Macrocytosis  without anemia 01/08/2012   Moderate aortic stenosis    a. 10/2012 Echo: EF 55-60%, no rwma, BAE, mild AI, mild TR; b. 06/2020 Echo: EF 50-55%, no rwma, mild LVH. Mod dil LA. Mild MR. Mod AS.   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   Sleep apnea    Questionable   Spinal stenosis    Stroke [facility])    Past Surgical History:  Procedure Laterality Date   BALLOON DILATION N/A 10/03/2020   Procedure: BALLOON DILATION;  Surgeon: [name], DO;  [facility];  Service: Endoscopy;  Laterality: N/A;   CARDIAC CATHETERIZATION  11/07/2012   Dr Acie Fredrickson   CATARACT EXTRACTION W/ INTRAOCULAR LENS IMPLANT Bilateral    CORONARY ARTERY BYPASS GRAFT N/A 11/11/2012   Procedure: CORONARY ARTERY BYPASS GRAFTING times four using Right Greater Saphenous Vein Graft harvested endoscopically and Left Internal Mammary Artery.;  Surgeon: Ivin Poot, MD;  Location: [place];  Service: Open Heart Surgery;  Laterality: N/A;   ESOPHAGEAL BRUSHING  10/03/2020   Procedure: ESOPHAGEAL BRUSHING;  Surgeon: [name], DO;  [facility];  Service: Endoscopy;;   ESOPHAGOGASTRODUODENOSCOPY (EGD) WITH PROPOFOL N/A 06/27/2020   Surgeon: [name], DO;  large hiatal hernia, LA grade  D esophagitis with no bleeding, normal examined duodenum.    ESOPHAGOGASTRODUODENOSCOPY (EGD) WITH PROPOFOL N/A 10/03/2020   Procedure: ESOPHAGOGASTRODUODENOSCOPY (EGD) WITH PROPOFOL;  Surgeon: [name], DO;  [facility];  Service: Endoscopy;  Laterality: N/A;  2:00pm   HIP ARTHROPLASTY Right 11/13/2020   Procedure: ARTHROPLASTY BIPOLAR HIP (HEMIARTHROPLASTY);  Surgeon: Renette Butters, MD;  Location: [place];  Service: Orthopedics;  Laterality: Right;   INGUINAL HERNIA REPAIR Right 01/10/2017   Procedure: OPEN RIGHT HERNIA REPAIR INGUINAL;  Surgeon: Ileana Roup, MD;  [facility];  Service: General;   Laterality: Right;   INSERTION OF MESH Right 01/10/2017   Procedure: INSERTION OF MESH;  Surgeon: Ileana Roup, MD;  [facility];  Service: General;  Laterality: Right;   INTRAOPERATIVE TRANSESOPHAGEAL ECHOCARDIOGRAM N/A 11/11/2012   Procedure: INTRAOPERATIVE TRANSESOPHAGEAL ECHOCARDIOGRAM;  Surgeon: Ivin Poot, MD;  Location: [place];  Service: Open Heart Surgery;  Laterality: N/A;   LEFT HEART CATHETERIZATION WITH CORONARY ANGIOGRAM N/A 11/07/2012   Procedure: LEFT HEART CATHETERIZATION WITH CORONARY ANGIOGRAM;  Surgeon: Thayer Headings, MD;  [facility];  Service: Cardiovascular;  Laterality: N/A;   LUMBAR LAMINECTOMY/DECOMPRESSION MICRODISCECTOMY N/A 02/05/2019   Procedure: LUMBAR LAMINECTOMY/DECOMPRESSION L3-L4;  Surgeon: Latanya Maudlin, MD;  [facility];  Service: Orthopedics;  Laterality: N/A;  22min   LYMPH NODE DISSECTION     Bilateral pelvic   RETROPUBIC PROSTATECTOMY     Radical   Social History:  reports that he has never smoked. He has never used smokeless tobacco. He reports current alcohol use. He reports that he does not use drugs.  Allergies  Allergen Reactions   Crestor [Rosuvastatin] Swelling    GYNECOMASTIA    Trazodone And Nefazodone     Hallucinations    Family History  Problem Relation Age of Onset   Aneurysm Father        Cerebral   Stroke Father    Hypertension Father    Cancer Mother        colon   Parkinson's disease Son     Prior to Admission medications   Medication Sig Start Date End Date Taking? Authorizing Provider  ascorbic acid (VITAMIN C) 500 MG tablet Take 1 tablet (500 mg total) by mouth daily. 06/30/20   Barton Dubois, MD  aspirin 81 MG EC tablet Take 1 tablet (81 mg total) by mouth daily. Swallow whole. 12/08/20   Love, Ivan Anchors, PA-C  carbidopa-levodopa (SINEMET IR) 25-100 MG tablet Take 1.5 tablets by mouth 3 (three) times daily. Patient taking differently: Take 1.5 tablets by mouth 2 (two) times daily.  09/21/20   Kathrynn Ducking, MD  Cholecalciferol (VITAMIN D) 2000 units CAPS Take 2,000 Units by mouth daily.    [provider]  docusate sodium (COLACE) 100 MG capsule Take 1 capsule (100 mg total) by mouth 2 (two) times daily. 12/07/20   Love, Ivan Anchors, PA-C  fluticasone (FLONASE) 50 MCG/ACT nasal spray One to 2 sprays each nostril at bedtime Patient taking differently: Place 2 sprays into both nostrils daily as needed for allergies. 10/12/14   Chipper Herb, MD  furosemide (LASIX) 40 MG tablet Take 1 tablet (40 mg total) by mouth 2 (two) times daily. One at breakfast. The other with lunch Patient taking differently: Take 40 mg by mouth daily. One at breakfast. 01/18/21   Claretta Fraise, MD  guaiFENesin (MUCINEX) 600 MG 12 hr tablet Take 600 mg by mouth 2 (two) times daily as needed  for cough.    [provider]  Iron, Ferrous Sulfate, 325 (65 Fe) MG TABS Take 1 tablet by mouth daily. Patient taking differently: Take 325 mg by mouth daily. 12/14/20   Claretta Fraise, MD  melatonin 5 MG TABS Take 1 tablet (5 mg total) by mouth at bedtime. 12/07/20   Love, Ivan Anchors, PA-C  Multiple Vitamins-Minerals (CERTAVITE/ANTIOXIDANTS) TABS Take 1 tablet by mouth daily. 12/08/20   Love, Ivan Anchors, PA-C  nitroGLYCERIN (NITROSTAT) 0.4 MG SL tablet Place 1 tablet (0.4 mg total) under the tongue every 5 (five) minutes as needed for chest pain. 07/20/15   Erlene Quan, PA-C  pantoprazole (PROTONIX) 40 MG tablet Take 1 tablet (40 mg total) by mouth 2 (two) times daily. 02/11/21   Thurnell Lose, MD  potassium chloride SA (KLOR-CON) 10 MEQ tablet Take 2 tablets (20 mEq total) by mouth daily. For potassium replacement/ supplement 01/18/21 04/18/21  Claretta Fraise, MD  senna-docusate (SENOKOT-S) 8.6-50 MG tablet Take 2 tablets by mouth daily with supper. 12/08/20   Love, Ivan Anchors, PA-C  triamcinolone cream (KENALOG) 0.1 % Apply 1 application topically daily as needed (itching). 06/29/20   Barton Dubois, MD     Physical Exam: Vitals:   02/20/2021 1100 02/16/2021 1115 03/11/2021 1200 03/09/2021 1230  BP: 110/66 109/61 123/67 138/64  Pulse: 72 71 76 76  Resp: (!) 21 (!) 22 (!) 22 (!) 21  Temp:      TempSrc:      SpO2: 95% 93% 93% 95%  Weight:      Height:       General:  Appears calm and comfortable and is in NAD but is frail with dried blood on his hospital gown Eyes:  PERRL, EOMI, normal iris; mild L > R conjunctival injection; R lower lid with ectropion ENT:  grossly normal hearing, lips & tongue, mmm; dried blood noted on lip Neck:  no LAD, masses or thyromegaly Cardiovascular:  RRR, no m/r/g. 2+ LE edema.  Respiratory:   CTA bilaterally with no wheezes/rales/rhonchi.  Normal respiratory effort. Abdomen:  soft, NT, ND Skin:  no rash or induration seen on limited exam Musculoskeletal:  grossly normal tone BUE/BLE, good ROM, no bony abnormality Psychiatric:  blunted mood and affect, speech fluent and appropriate, AOx3 Neurologic:  CN 2-12 grossly intact, moves all extremities in coordinated fashion   Radiological Exams on Admission: Independently reviewed - see discussion in A/P where applicable  No results found.  EKG: not done   Labs on Admission: I have personally reviewed the available labs and imaging studies at the time of the admission.  Pertinent labs:    K+ 3.2 Glucose 160 BUN 24/Creatinine 2.08/GFR 30; 22/1.41/47 on 12/8 WBC 12.7 Hgb 10.8 at 0154, stable Heme positive    Assessment/Plan * Hematemesis- (present on admission) -Patient is presenting with melena and hematemesis, suggestive of upper GI bleeding. -He has known h/o grade D esophagitis with recurrent GI bleeding; most recent hospitalization was 1 month ago. -The patient is not tachycardic with normal blood pressure, suggesting subacute volume loss.  -Will admit to telemetry -GI consulted by ED, will follow up recommendations -NPO for possible EGD -NS at 100 mL/hr -Start IV pantoprazole bolus and  infusion given frank bleeding with concern for hemodynamic instability -Zofran IV for nausea -Avoid NSAIDs and SQ heparin -Maintain IV access (2 large bore IVs if possible). -Hold ASA for now -Hgb appears to be stable but will recheck this AM -Type and screen were done in ED.  -  Monitor closely and follow cbc q12h, transfuse as necessary for Hbg <7   AKI (acute kidney injury) ([place])- (present on admission) -Stage 3a CKD at baseline, currently with worsening -Will give gentle IVF hydration -Recheck BMP in AM  DNR (do not resuscitate)- (present on admission) -I have discussed code status with the patient and he would not desire resuscitation and would prefer to die a natural death should that situation arise. -He will need a gold out of facility DNR form at the time of discharge  Ectropion -Continue tobramycin drops  Hypokalemia- (present on admission) -K+ is 3.2 -Will replete -He has h/o prolonged QTc so will check EKG  Coronary artery disease involving native coronary artery of native heart without angina pectoris- (present on admission) -s/p CABG -Hold ASA for now given active bleeding  Abnormality of gait -Continue Sinemet  Chronic renal insufficiency, stage III (moderate) ([place])- (present on admission) -Stage 3a -AKI for now given GI bleed -Hydrating, will follow  Benign essential hypertension- (present on admission) -He does not appear to be taking chronic BP medications -Will cover with prn IV hydralazine    Advance Care Planning:   Code Status: DNR   Consults: GI  Family Communication: None present; I attempted to call and was unable to reach his son at the time of admission  Severity of Illness: Admit - It is my clinical opinion that admission to [place] is reasonable and necessary because of the expectation that this patient will require hospital care that crosses at least 2 midnights to treat this condition based on the medical complexity of the problems  presented.  Given the aforementioned information, the predictability of an adverse outcome is felt to be significant.   Author: Karmen Bongo, M.D. 02/20/2021 2:20 PM  For on call review [email or web address].

## 2021-03-13 NOTE — Consult Note (Signed)
NAME:  Jon Reid, MRN:  [id number], DOB:  October 23, 1928, LOS: 0 ADMISSION DATE:  02/17/2021, CONSULTATION DATE:  03/05/2021 REFERRING MD:  Carmelina Peal, CHIEF COMPLAINT:   GI bleed   History of Present Illness:  Jon Reid is a 87 y.o. male w a past medical history significant for CKD stage III, hypertension, moderate AAS, CAD status post CABG, OSA, and prostate cancer who presented to the emergency department with recurrent hematemesis.  Patient has history of upper GI bleed with acute blood loss anemia and grade D esophagitis on EGD June 2022.  Of note patient was admitted 11/23 through 11/26 for recurrent bleeding underwent EGD with no active signs of bleeding.  Patient reports hematemesis began 11/25  On ED arrival patient was seen hemodynamically stable with mild tachypnea lab work significant for potassium 3.2, glucose 160, creatinine 2.80 up from 1.41, WBC 12.7, and hemoglobin 10.8 with subsequent drop to 9.3 while in the ED. GI consult and patient initially patient refused EGD but later began having more frequent hematic emesis prompting repeat and engagement of GI.  PCCM also consulted for further assistance  Pertinent  Medical History  CKD stage III, hypertension, moderate AAS, CAD status post CABG, OSA, and prostate cancer  Significant Hospital Events: Including procedures, antibiotic start and stop dates in addition to other pertinent events   12/26 presented with hematic emesis and drop in hemoglobin  Interim History / Subjective:  Seen lying on ED stretcher frail with emesis that appeared coffee-ground in nature  Objective   Blood pressure (!) 88/61, pulse 91, temperature 98.3 F (36.8 C), temperature source Oral, resp. rate (!) 23, height 5\' 6"  (1.676 m), weight 69.4 kg, SpO2 90 %.        Intake/Output Summary (Last 24 hours) at 03/18/2021 1830 Last data filed at 03/16/2021 1723 Gross per 24 hour  Intake 400 ml  Output --  Net 400 ml   Filed Weights   03/14/2021 0121   Weight: 69.4 kg    Examination: General: Acute on chronic frail elderly male lying in bed in no acute distress HEENT: [place]/AT, MM pink/moist, PERRL,  Neuro: Alert and oriented x3 CV: s1s2 regular rate and rhythm, no murmur, rubs, or gallops,  PULM: Clear to auscultation bilaterally, no increased work of breathing, no added breath sounds GI: soft, bowel sounds active in all 4 quadrants, non-tender, non-distended Extremities: warm/dry, no edema  Skin: no rashes or lesions  Resolved Hospital Problem list     Assessment & Plan:  Concern for acute upper GI bleed with hematemesis  -Seen with multiple episodes coffee-ground emesis in ED Acute blood loss anemia -Hemoglobin dropped from 10.5-9.3 while in ED P: Admit to ICU for close monitoring  IV resuscitation GI consulted, appreciate assistance Transfuse for hemoglobin goal greater than 7 Trend CBC  Identify and treat course  Type and screen NPO  Maintain 2 large bore PIVs Twice daily PPI Hold any further anticoagulation or NSAIDs  As needed antiemetics Hold aspirin  Acute kidney injury superimposed on CKD stage 3a -Creatinine 1.41 with GFR 4710/11/08, creatinine 2.80 with GFR 30 on admit P: Follow renal function  Monitor urine output Trend Bmet Avoid nephrotoxins Ensure adequate renal perfusion  IV hydration  Hypokalemia P: Trend Bmet Supplement as needed  History of CAD status post CABG History of grade 2 diastolic dysfunction History of hypertension -Home medication includes aspirin, Lasix P: Hold aspirin and Lasix Continuous telemetry  Monitor for signs of volume overload given need for resuscitation and  blood products Daily weight to assess volume status Closely monitor renal function and electrolytes  Provide supplemtal oxygen as needed to maintain oxygen saturations above 90%  Best Practice (right click and "Reselect all SmartList Selections" daily)   Diet/type: NPO DVT prophylaxis: SCD GI  prophylaxis: PPI Lines: N/A Foley:  N/A Code Status:  DNR Last date of multidisciplinary goals of care discussion: Pending   Labs   CBC: Recent Labs  Lab 02/27/2021 0154 02/17/2021 1039 03/08/2021 1714  WBC 12.7* 10.5 14.0*  HGB 10.8* 10.2* 9.3*  HCT 34.3* 31.7* 28.9*  MCV 99.7 97.2 98.0  PLT 338 320 696    Basic Metabolic Panel: Recent Labs  Lab 02/18/2021 0154  NA 144  K 3.2*  CL 101  CO2 28  GLUCOSE 160*  BUN 24*  CREATININE 2.08*  CALCIUM 9.3   GFR: Estimated Creatinine Clearance: 21.3 mL/min (A) (by C-G formula based on SCr of 2.08 mg/dL (H)). Recent Labs  Lab 03/14/2021 0154 02/18/2021 1039 03/14/2021 1714  WBC 12.7* 10.5 14.0*    Liver Function Tests: Recent Labs  Lab 03/09/2021 0154  AST 30  ALT 15  ALKPHOS 94  BILITOT 0.7  PROT 7.6  ALBUMIN 4.1   No results for input(s): LIPASE, AMYLASE in the last 168 hours. No results for input(s): AMMONIA in the last 168 hours.  ABG    Component Value Date/Time   PHART 7.298 (L) 11/11/2012 1933   PCO2ART 45.1 (H) 11/11/2012 1933   PO2ART 136.0 (H) 11/11/2012 1933   HCO3 22.1 11/11/2012 1933   TCO2 23 11/12/2012 1700   ACIDBASEDEF 4.0 (H) 11/11/2012 1933   O2SAT 99.0 11/11/2012 1933     Coagulation Profile: No results for input(s): INR, PROTIME in the last 168 hours.  Cardiac Enzymes: No results for input(s): CKTOTAL, CKMB, CKMBINDEX, TROPONINI in the last 168 hours.  HbA1C: Hemoglobin A1C  Date/Time Value Ref Range Status  05/31/2014 12:47 PM 5.5%  Final    Comment:    4.8-5.6% normal range  09/03/2013 10:19 AM 5.9  Final    Comment:    4.8-5.6 NORMAL RANGE   Hgb A1c MFr Bld  Date/Time Value Ref Range Status  11/08/2012 05:00 AM 5.5 <5.7 % Final    Comment:    (NOTE)                                                                       According to the ADA Clinical Practice Recommendations for 2011, when HbA1c is used as a screening test:  >=6.5%   Diagnostic of Diabetes Mellitus           (if  abnormal result is confirmed) 5.7-6.4%   Increased risk of developing Diabetes Mellitus References:Diagnosis and Classification of Diabetes Mellitus,Diabetes EXBM,8413,24(MWNUU 1):S62-S69 and Standards of Medical Care in         Diabetes - 2011,Diabetes Care,2011,34 (Suppl 1):S11-S61.    CBG: No results for input(s): GLUCAP in the last 168 hours.  Review of Systems:   Please see the history of present illness. 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   Surgical History:   Past Surgical History:  Procedure Laterality Date   BALLOON DILATION N/A 10/03/2020   Procedure: BALLOON DILATION;  Surgeon: [name], DO;  [facility];  Service: Endoscopy;  Laterality: N/A;   CARDIAC CATHETERIZATION  11/07/2012   Dr Acie Fredrickson   CATARACT EXTRACTION W/ INTRAOCULAR LENS IMPLANT Bilateral    CORONARY ARTERY BYPASS GRAFT N/A 11/11/2012   Procedure: CORONARY ARTERY BYPASS GRAFTING times four using Right Greater Saphenous Vein Graft harvested endoscopically and Left Internal Mammary Artery.;  Surgeon: Ivin Poot, MD;  Location: [place];  Service: Open Heart Surgery;  Laterality: N/A;   ESOPHAGEAL BRUSHING  10/03/2020   Procedure: ESOPHAGEAL BRUSHING;  Surgeon: [name], DO;  [facility];  Service: Endoscopy;;   ESOPHAGOGASTRODUODENOSCOPY (EGD) WITH PROPOFOL N/A 06/27/2020   Surgeon: [name], DO;  large hiatal hernia, LA grade D esophagitis with no bleeding, normal  examined duodenum.    ESOPHAGOGASTRODUODENOSCOPY (EGD) WITH PROPOFOL N/A 10/03/2020   Procedure: ESOPHAGOGASTRODUODENOSCOPY (EGD) WITH PROPOFOL;  Surgeon: [name], DO;  [facility];  Service: Endoscopy;  Laterality: N/A;  2:00pm   HIP ARTHROPLASTY Right 11/13/2020   Procedure: ARTHROPLASTY BIPOLAR HIP (HEMIARTHROPLASTY);  Surgeon: Renette Butters, MD;  Location: [place];  Service: Orthopedics;  Laterality: Right;   INGUINAL HERNIA REPAIR Right 01/10/2017   Procedure: OPEN RIGHT HERNIA REPAIR INGUINAL;  Surgeon: Ileana Roup, MD;  Location[facility];  Service: General;  Laterality: Right;   INSERTION OF MESH Right 01/10/2017   Procedure: INSERTION OF MESH;  Surgeon: Ileana Roup, MD;  [facility];  Service: General;  Laterality: Right;   INTRAOPERATIVE TRANSESOPHAGEAL ECHOCARDIOGRAM N/A 11/11/2012   Procedure: INTRAOPERATIVE TRANSESOPHAGEAL ECHOCARDIOGRAM;  Surgeon: Ivin Poot, MD;  Location: [place];  Service: Open Heart Surgery;  Laterality: N/A;   LEFT HEART CATHETERIZATION WITH CORONARY ANGIOGRAM N/A 11/07/2012   Procedure: LEFT HEART CATHETERIZATION WITH CORONARY ANGIOGRAM;  Surgeon: Thayer Headings, MD;  Location: [facility];  Service: Cardiovascular;  Laterality: N/A;   LUMBAR LAMINECTOMY/DECOMPRESSION MICRODISCECTOMY N/A 02/05/2019   Procedure: LUMBAR LAMINECTOMY/DECOMPRESSION L3-L4;  Surgeon: Latanya Maudlin, MD;  Location[facility];  Service: Orthopedics;  Laterality: N/A;  102min   LYMPH NODE DISSECTION     Bilateral pelvic   RETROPUBIC PROSTATECTOMY     Radical     Social History:   reports that he has never smoked. He has never used smokeless tobacco. He reports current alcohol use. He reports that he does not use drugs.   Family History:  His family history includes Aneurysm in his father; Cancer in his mother; Hypertension in his father; Parkinson's disease in his son; Stroke in his father.   Allergies Allergies  Allergen Reactions    Crestor [Rosuvastatin] Swelling    GYNECOMASTIA    Trazodone And Nefazodone     Hallucinations     Home Medications  Prior to Admission medications   Medication Sig Start Date End Date Taking? Authorizing Provider  ascorbic acid (VITAMIN C) 500 MG tablet Take 1 tablet (500 mg total) by mouth daily. 06/30/20  Yes Barton Dubois, MD  aspirin 81 MG EC tablet Take 1 tablet (81 mg total) by mouth daily. Swallow whole. 12/08/20  Yes Love, Ivan Anchors, PA-C  carbidopa-levodopa (SINEMET IR) 25-100 MG tablet Take 1.5 tablets by mouth 3 (three) times daily. Patient taking differently: Take 1 tablet by mouth 2 (two) times daily. 09/21/20  Yes Kathrynn Ducking, MD  docusate sodium (COLACE) 100 MG capsule Take 1 capsule (100 mg total) by mouth 2 (two) times daily. Patient taking differently: Take 100 mg by mouth daily. 12/07/20  Yes Love, Ivan Anchors, PA-C  furosemide (LASIX) 40 MG tablet Take 1 tablet (40 mg total) by mouth 2 (two) times daily. One at breakfast. The other with lunch Patient taking differently: Take 40 mg by mouth daily. One at breakfast. 01/18/21  Yes Stacks, [name], MD  Iron, Ferrous Sulfate, 325 (65 Fe) MG TABS Take 1 tablet by mouth daily. Patient taking differently: Take 325 mg by mouth daily. 12/14/20  Yes Stacks, [name], MD  Multiple Vitamins-Minerals (CERTAVITE/ANTIOXIDANTS) TABS Take 1 tablet by mouth daily. 12/08/20  Yes Love, Ivan Anchors, PA-C  pantoprazole (PROTONIX) 40 MG tablet Take 1 tablet (40 mg total) by mouth 2 (two) times daily. Patient taking differently: Take 40 mg by mouth daily. 02/11/21  Yes Thurnell Lose, MD  potassium chloride SA (KLOR-CON) 10 MEQ tablet Take 2 tablets (20 mEq total) by mouth daily. For potassium replacement/ supplement Patient taking differently: Take 10 mEq by mouth daily. 01/18/21 04/18/21 Yes Stacks, [name], MD  tobramycin (TOBREX) 0.3 % ophthalmic solution Place 1 drop into the left eye 2 (two) times daily.   Yes [provider]  melatonin 5 MG  TABS Take 1 tablet (5 mg total) by mouth at bedtime. Patient not taking: Reported on 02/25/2021 12/07/20   Love, Ivan Anchors, PA-C  nitroGLYCERIN (NITROSTAT) 0.4 MG SL tablet Place 1 tablet (0.4 mg total) under the tongue every 5 (five) minutes as needed for chest pain. Patient not taking: Reported on 02/16/2021 07/20/15   Erlene Quan, PA-C  senna-docusate (SENOKOT-S) 8.6-50 MG tablet Take 2 tablets by mouth daily with supper. Patient not taking: Reported on 02/21/2021 12/08/20   Bary Leriche, PA-C     Critical care time:    CRITICAL CARE Performed by: Kwinton Maahs D. Harris   Total critical care time: 45 minutes  Critical care time was exclusive of separately billable procedures and treating other patients.  Critical care was necessary to treat or prevent imminent or life-threatening deterioration.  Critical care was time spent personally by me on the following activities: development of treatment plan with patient and/or surrogate as well as nursing, discussions with consultants, evaluation of patient's response to treatment, examination of patient, obtaining history from patient or surrogate, ordering and performing treatments and interventions, ordering and review of laboratory studies, ordering and review of radiographic studies, pulse oximetry and re-evaluation of patient's condition.  Gust Eugene D. Kenton Kingfisher, NP-C [facility] Personal contact information can be found on Amion  03/09/2021, 6:45 PM

## 2021-03-13 NOTE — Anesthesia Preprocedure Evaluation (Addendum)
Anesthesia Evaluation  Patient identified by MRN, date of birth, ID band Patient awake    Reviewed: Allergy & Precautions, NPO status , Patient's Chart, lab work & pertinent test results  History of Anesthesia Complications (+) history of anesthetic complications (dental damage)  Airway Mallampati: III  TM Distance: >3 FB Neck ROM: Limited    Dental  (+) Poor Dentition, Loose, Missing, Chipped, Dental Advisory Given   Pulmonary sleep apnea ,    breath sounds clear to auscultation       Cardiovascular hypertension, (-) angina+ CAD and + CABG  PERIPHERAL VASCULAR DISEASE: 2014.  + Valvular Problems/Murmurs (moderate) AS  Rhythm:Regular Rate:Tachycardia + Systolic murmurs 09/32/6712 ECHO: EF 55-60%, normal LVF, grade 2 DD, mild-mod MR, mod AV calcification with mod AS, mean grad 23 mmHg   Neuro/Psych parkinson's CVA    GI/Hepatic Neg liver ROS, GERD  ,GI bleed   Endo/Other  negative endocrine ROS  Renal/GU Renal InsufficiencyRenal disease   Prostate cancer    Musculoskeletal  (+) Arthritis ,   Abdominal   Peds  Hematology  (+) Blood dyscrasia (Hb 10.2), anemia ,   Anesthesia Other Findings   Reproductive/Obstetrics                            Anesthesia Physical Anesthesia Plan  ASA: 4 and emergent  Anesthesia Plan: General   Post-op Pain Management: Minimal or no pain anticipated   Induction: Intravenous and Rapid sequence  PONV Risk Score and Plan: 2 and Ondansetron, Treatment may vary due to age or medical condition and Dexamethasone  Airway Management Planned: Oral ETT  Additional Equipment: Arterial line  Intra-op Plan:   Post-operative Plan: Possible Post-op intubation/ventilation  Informed Consent: I have reviewed the patients History and Physical, chart, labs and discussed the procedure including the risks, benefits and alternatives for the proposed anesthesia with the  patient or authorized representative who has indicated his/her understanding and acceptance.   Patient has DNR.  Discussed DNR with power of attorney, Suspend DNR and Discussed DNR with patient.   Dental advisory given  Plan Discussed with: CRNA and Surgeon  Anesthesia Plan Comments: (Plan routine monitors, GETA with probable post op ventilation Patient wishes to proceed, family aware and in agreement )      Anesthesia Quick Evaluation

## 2021-03-13 NOTE — Interval H&P Note (Signed)
History and Physical Interval Note:  03/05/2021 7:41 PM  Jon Reid  has presented today for surgery, with the diagnosis of Hematemesisis, hemorrhagic shock.  The various methods of treatment have been discussed with the patient and family. After consideration of risks, benefits and other options for treatment, the patient has consented to  Procedure(s): ESOPHAGOGASTRODUODENOSCOPY (EGD) (Left) as a surgical intervention.  The patient's history has been reviewed, patient examined, no change in status, stable for surgery.  I have reviewed the patient's chart and labs.  Questions were answered to the patient's satisfaction.     Dominic Pea [name]

## 2021-03-13 NOTE — ED Notes (Signed)
Ladell Pier (Spouse) called asking for an update. Call back at [phone]

## 2021-03-13 NOTE — Assessment & Plan Note (Signed)
-  Stage 3a -AKI for now given GI bleed -Hydrating, will follow

## 2021-03-13 NOTE — Anesthesia Procedure Notes (Addendum)
Procedure Name: Intubation Date/Time: 02/19/2021 7:46 PM Performed by: Oletta Lamas, CRNA Pre-anesthesia Checklist: Patient identified, Emergency Drugs available, Suction available and Patient being monitored Patient Re-evaluated:Patient Re-evaluated prior to induction Oxygen Delivery Method: Circle System Utilized Preoxygenation: Pre-oxygenation with 100% oxygen Induction Type: IV induction, Rapid sequence and Cricoid Pressure applied Laryngoscope Size: Glidescope and 3 Grade View: Grade I Tube type: Oral Number of attempts: 1 Airway Equipment and Method: Stylet Placement Confirmation: ETT inserted through vocal cords under direct vision, positive ETCO2 and breath sounds checked- equal and bilateral Secured at: 23 cm Tube secured with: Tape Dental Injury: Teeth and Oropharynx as per pre-operative assessment  Comments: Coffee ground material noted around vocal cords, then frank emesis as ETT passed through cords, balloon inflated immediately. Dark secretions suctioned from ETT after placement of tube prior to ventilation.  Suspect aspiration prior to induction.

## 2021-03-13 NOTE — Assessment & Plan Note (Signed)
-  s/p CABG -Hold ASA for now given active bleeding

## 2021-03-13 NOTE — ED Notes (Addendum)
Pt had 2 coffee ground emesis episodes over 20 min. Est. Amount 50-33mL total

## 2021-03-13 NOTE — Progress Notes (Addendum)
eLink Physician-Brief Progress Note Patient Name: KENTLEY BLYDEN DOB: [birth year] MRN: [id number]   Date of Service  03/02/2021  HPI/Events of Note  87 year old man has arrived to the ICU after endoscopy. GI note is pending, RN mentioned chronic gastritis changes noted and planned for repeat EGD in 48 hours. He arrived intubated and on PRVC 100%/peep 5/ RR 18/ VT 510. He has propofol infusing and Neosynephrine at 25 mic infusing. He has PIV and art line placed  eICU Interventions  CXR ABG BMP, CBC, LA now I ordered the propofol and neo as those were not in IV PPI and LR to continue D/w RN      Intervention Category Major Interventions: Respiratory failure - evaluation and management;Shock - evaluation and management Evaluation Type: New Patient Evaluation  Margaretmary Lombard 02/19/2021, 9:38 PM  Addendum : CBC noted, stable H/H BMP noted - worsening AKI. Has had metabolic alkalosis which persists LR bolus x 1 Stop LR infusion, switch to NS Lower the Fio2 to keep o2 sat > 93 Lower RR to 18 on vent  Repeat ABG and LA at 230 am

## 2021-03-13 NOTE — Anesthesia Procedure Notes (Signed)
Arterial Line Insertion Start/End12/15/2022 7:20 AM, 03/03/2021 7:26 PM Performed by: Oletta Lamas, CRNA, CRNA  Patient location: Pre-op. Preanesthetic checklist: patient identified, IV checked, site marked, risks and benefits discussed, surgical consent, monitors and equipment checked, pre-op evaluation, timeout performed and anesthesia consent Lidocaine 1% used for infiltration radial was placed Catheter size: 20 G Hand hygiene performed , maximum sterile barriers used  and Seldinger technique used Allen's test indicative of satisfactory collateral circulation Attempts: 1 Procedure performed without using ultrasound guided technique. Following insertion, dressing applied and Biopatch. Post procedure assessment: normal  Patient tolerated the procedure well with no immediate complications.

## 2021-03-13 NOTE — Op Note (Signed)
[facility] Patient Name: Jon Reid Procedure Date : 03/11/2021 MRN: [id number] Attending MD: Gerrit Heck , MD Date of Birth: [birth year] CSN: [id number] Age: 87 Admit Type: Inpatient Procedure:                Upper GI endoscopy Indications:              Acute post hemorrhagic anemia, Coffee-ground                            emesis, Melena Providers:                Gerrit Heck, MD, Elmer Ramp. Tilden Dome, RN, Dietitian, [employer] Referring MD:              Medicines:                Monitored Anesthesia Care. Transfused 1U pRBCs                            intra-operatively. Complications:            No immediate complications. Estimated Blood Loss:     Estimated blood loss: none. Procedure:                Pre-Anesthesia Assessment:                           - Prior to the procedure, a History and Physical                            was performed, and patient medications and                            allergies were reviewed. The patient's tolerance of                            previous anesthesia was also reviewed. The risks                            and benefits of the procedure and the sedation                            options and risks were discussed with the patient.                            All questions were answered, and informed consent                            was obtained. Prior Anticoagulants: The patient has                            taken no previous anticoagulant or antiplatelet                            agents except for aspirin. ASA  Grade Assessment: IV                            - A patient with severe systemic disease that is a                            constant threat to life. After reviewing the risks                            and benefits, the patient was deemed in                            satisfactory condition to undergo the procedure.                           After obtaining informed  consent, the endoscope was                            passed under direct vision. Patient was electively                            intubated prior to the start of the procedure for                            airway protection. Throughout the procedure, the                            patient's blood pressure, pulse, and oxygen                            saturations were monitored continuously. The                            [id number]-H190 ([id number] Olympus endoscope was introduced                            through the mouth, and advanced to the duodenal                            bulb. The upper GI endoscopy was technically                            difficult and complex. The patient tolerated the                            procedure well. Scope In: Scope Out: Findings:      Fluid was found in the middle third of the esophagus and in the lower       third of the esophagus. Fluid was removed with endoscope suction.      A medium-sized hiatal hernia was present.      Localized mucosal changes characterized by esophagitis and mild mucosal       friability (with contact bleeding) were found in the lower third of the       esophagus. This  was lavaged and bleeding did not persist.      Retained fluid and food material was found in the gastric fundus, in the       gastric body and in the gastric antrum. Approximately 3L of fluid was       removed via endoscope suction, and still with limited views. The       visualized mucosa was otherwise normal appearing without any active       bleeding or high grade stigmata noted, albeit on significantly limited       views.      The duodenal bulb was examined with views limited by retained food       material. Impression:               - Fluid in the middle third of the esophagus and in                            the lower third of the esophagus.                           - Medium-sized hiatal hernia.                           - Esophagitis with friability  and mild contact                            oozing (self-limited) noted in the lower esophagus.                           - Retained gastric and esophageal fluid, with                            retained food debree in the stomach. Despite                            significant suctioning (removed 3+ liters via                            endoscope), views were still quite limited.                           - Normal duodenal bulb.                           - No specimens collected.                           - NGT was placed at the conclusion of the study and                            an additional 500 cc was immediately removed. This                            was left in place to [facility]. Recommendation:           - Return patient to ICU for ongoing care. To remain  intuabted overnight for airway protection.                           - To remain NPO.                           - NGT in place to [facility].                           - Continue Protonix (pantoprazole) 40 mg IV BID.                           - Use sucralfate suspension 1 gram PO QID when                            extubated and tolerating PO intake.                           - Continue serial CBC checks with additional blood                            products as needed per protocol (was transfused 1                            unit pRBCs intra-operatively).                           - Will tentatively plan for repeat upper endoscopy                            in 48-72 hours depending on clinical course. Will                            plan for Reglan 10 mg IV prior to any repeat                            endoscopy.                           - I update the patient's family by phone. Procedure Code(s):        --- Professional ---                           [phone], Esophagogastroduodenoscopy, flexible,                            transoral; diagnostic, including collection of                             specimen(s) by brushing or washing, when performed                            (separate procedure) Diagnosis Code(s):        --- Professional ---  K44.9, Diaphragmatic hernia without obstruction or                            gangrene                           K22.8, Other specified diseases of esophagus                           D62, Acute posthemorrhagic anemia                           K92.0, Hematemesis                           K92.1, Melena (includes Hematochezia) CPT copyright 2019 [employer]. All rights reserved. The codes documented in this report are preliminary and upon coder review may  be revised to meet current compliance requirements. Gerrit Heck, MD 02/16/2021 8:46:33 PM Number of Addenda: 0

## 2021-03-13 NOTE — Consult Note (Signed)
Referring Provider: Dr. Golden Pop Primary Care Physician:  Claretta Fraise, MD Primary Gastroenterologist:  Dr. Hurshel Keys   Reason for Consultation: Hematemesis  HPI: Jon Reid is a 87 y.o. male with a past medical history of hypertension, CAD s/p 4 vessel CABG, aortic stenosis, first-degree AV block, CVA CKD stage III, questionable sleep apnea, prostate cancer, gout, IDA, GERD and upper GI bleed.  He was admitted to [facility] 04/11/2020 due to having upper GI bleed with coffee-ground emesis dark stools then transferred  to [facility].  Admission hemoglobin level was  9.9 ->7.9.  He was transfused one unit of PRBCs and his Hg stabilized around 9. Stool was hemoccult negative at that time. 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 There were no acute findings and no obvious source for bleeding.  Given his advanced age and multiple comorbidities with mild anemia, endoscopic evaluation was deferred and conservative management was recommended.  He was discharged home on 02/11/2021 with PPI twice daily and he as advised to follow-up with his GI in [place].  He presented to [facility] ED this morning secondary to having hematemesis which started yesterday evening.  He endorsed vomiting coffee colored emesis 3 to 4 times yesterday evening and 3 to 4 times this morning. He also passed a solid black stool x 2 this morning.  He takes iron po QD. Labs in the ED showed a hemoglobin level of 10.8 ( Hg 10.5 on 02/23/2021).  Hematocrit 32.4.  MCV 93.  Platelet 369.  WBC 12.7.  BUN 24 ( 22).  Creatinine 2.08 (1.41 on 02/23/2021).  Normal LFTs.  FOBT positive.  SARS coronavirus 2 negative.  Influenza A and B negative. He received IV Protonix bolus followed by continuous infusion. He had 2 episodes of coffee-ground emesis while  in the ED, measuring approximately 50 to 75 mm in total.  A GI consult was requested for further evaluation.  He actively vomited approximately 100 cc of coffee colored liquid emesis in my presence.  I ordered a stat dose of Reglan 5 mg IV x1.  He takes aspirin 81 mg daily.  No other NSAID use. He is not on any blood thinners.  No abdominal pain.  No recent dysphagia or heartburn.  He reports being compliant with taking Pantoprazole twice daily.  He denies having any chest pain, palpitations or shortness of breath at this time.  No family at the bedside.  I attempted to call his son Jeani Hawking but he did not answer his phone.  Recent endoscopic procedures:  EGD April 2022 at [facility] for evaluation of upper GI bleed --Large hiatal hernia, grade D esophagitis - LA Grade D erosive esophagitis with no bleeding. - Normal duodenal bulb, first portion of the duodenum and second portion of the duodenum. - No specimens collected.   EGD July 2022 [facility]) for follow-up on previous EGD --Candida esophagitis, nonsevere.  Mild gastritis . KOH showed some yeast. - Non-severe candidiasis esophagitis with no bleeding. Cells for cytology obtained. - Gastritis. - Large hiatal hernia. - Normal duodenal bulb, first portion of the duodenum and second portion of the duodenum.   ECHO 02/09/2021: Left ventricular ejection fraction, by estimation, is 55 to 60%. The left ventricle has normal function. The left ventricle has no regional wall motion abnormalities. Left ventricular diastolic parameters are consistent with Grade II  diastolic dysfunction (pseudonormalization). 1. 2. Right ventricular systolic function is normal. The right ventricular size is normal. 3. Left atrial size was severely dilated. The mitral valve is degenerative. Mild to moderate mitral valve regurgitation. No evidence of mitral stenosis. 4. The aortic valve is tricuspid. There is moderate calcification of the aortic valve. There is  moderate thickening of the aortic valve. Aortic valve regurgitation is trivial. Moderate aortic valve stenosis. Aortic valve area, by VTI measures 1.05 cm. Aortic valve mean gradient measures 23.0 mmHg. Aortic valve Vmax measures 3.17 m/s.  Past Medical History:  Diagnosis Date   Arthritis    Carcinoma of prostate ([place])    prostate   CKD (chronic kidney disease), stage III (HCC)    Coronary artery disease    a. 10/2012 Cath: LM 20-30d, lAD 80p, 93m, 80-90d, LCX 90-26m, OM1 90, RCA 50p, 34m, 50d, RPDA 90, EF nl; b. 10/2012 CABG x 4 (LIMA-LAD, SVG-OM1, SVG-PDA->RPL); c. 07/2015 MV: Basal and proximal septal infarct w/o ischemia. EF 57%-->low risk.   First degree AV block 01/28/2019   Noted on EKG   GERD (gastroesophageal reflux disease)    Gout    HTN (hypertension)    Macrocytosis without anemia 01/08/2012   Moderate aortic stenosis    a. 10/2012 Echo: EF 55-60%, no rwma, BAE, mild AI, mild TR; b. 06/2020 Echo: EF 50-55%, no rwma, mild LVH. Mod dil LA. Mild MR. Mod AS.   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   Sleep apnea    Questionable   Spinal stenosis    Stroke [facility])     Past Surgical History:  Procedure Laterality Date   BALLOON DILATION N/A 10/03/2020   Procedure: BALLOON DILATION;  Surgeon: [name], DO;  [facility];  Service: Endoscopy;  Laterality: N/A;   CARDIAC CATHETERIZATION  11/07/2012   Dr Acie Fredrickson   CATARACT EXTRACTION W/ INTRAOCULAR LENS IMPLANT Bilateral    CORONARY ARTERY BYPASS GRAFT N/A 11/11/2012   Procedure: CORONARY ARTERY BYPASS GRAFTING times four using Right Greater Saphenous Vein Graft harvested endoscopically and Left Internal Mammary Artery.;  Surgeon: Ivin Poot, MD;  Location: [place];  Service: Open Heart Surgery;  Laterality: N/A;   ESOPHAGEAL BRUSHING  10/03/2020   Procedure: ESOPHAGEAL BRUSHING;  Surgeon: [name], DO;  [facility];  Service: Endoscopy;;   ESOPHAGOGASTRODUODENOSCOPY (EGD) WITH PROPOFOL N/A 06/27/2020   Surgeon: [name], DO;  large hiatal hernia, LA grade D esophagitis with no bleeding, normal examined duodenum.    ESOPHAGOGASTRODUODENOSCOPY (EGD) WITH PROPOFOL N/A 10/03/2020   Procedure: ESOPHAGOGASTRODUODENOSCOPY (EGD) WITH PROPOFOL;  Surgeon: [name], DO;  [facility];  Service: Endoscopy;  Laterality: N/A;  2:00pm   HIP ARTHROPLASTY Right 11/13/2020   Procedure: ARTHROPLASTY BIPOLAR HIP (HEMIARTHROPLASTY);  Surgeon: Renette Butters, MD;  Location: [place];  Service: Orthopedics;  Laterality: Right;   INGUINAL HERNIA REPAIR Right 01/10/2017   Procedure: OPEN RIGHT HERNIA REPAIR INGUINAL;  Surgeon: Ileana Roup, MD;  Location: [facility];  Service: General;  Laterality: Right;   INSERTION OF MESH Right 01/10/2017   Procedure: INSERTION OF MESH;  Surgeon: Ileana Roup, MD;  Location: [facility];  Service: General;  Laterality: Right;   INTRAOPERATIVE TRANSESOPHAGEAL ECHOCARDIOGRAM N/A 11/11/2012   Procedure: INTRAOPERATIVE TRANSESOPHAGEAL ECHOCARDIOGRAM;  Surgeon: Ivin Poot, MD;  Location: [place];  Service: Open Heart Surgery;  Laterality: N/A;   LEFT HEART CATHETERIZATION WITH CORONARY ANGIOGRAM N/A 11/07/2012   Procedure: LEFT HEART CATHETERIZATION WITH CORONARY ANGIOGRAM;  Surgeon: Thayer Headings, MD;  Location: [facility] CATH LAB;  Service: Cardiovascular;  Laterality: N/A;   LUMBAR LAMINECTOMY/DECOMPRESSION MICRODISCECTOMY N/A 02/05/2019   Procedure: LUMBAR LAMINECTOMY/DECOMPRESSION L3-L4;  Surgeon: Latanya Maudlin, MD;  Location: [facility];  Service: Orthopedics;  Laterality: N/A;  73min   LYMPH NODE DISSECTION     Bilateral pelvic   RETROPUBIC PROSTATECTOMY     Radical    Prior to Admission medications   Medication Sig Start Date End Date Taking? Authorizing Provider  ascorbic acid (VITAMIN C) 500 MG tablet Take 1 tablet (500 mg total) by mouth daily. 06/30/20   Yes Barton Dubois, MD  aspirin 81 MG EC tablet Take 1 tablet (81 mg total) by mouth daily. Swallow whole. 12/08/20  Yes Love, Ivan Anchors, PA-C  carbidopa-levodopa (SINEMET IR) 25-100 MG tablet Take 1.5 tablets by mouth 3 (three) times daily. Patient taking differently: Take 1 tablet by mouth 2 (two) times daily. 09/21/20  Yes Kathrynn Ducking, MD  docusate sodium (COLACE) 100 MG capsule Take 1 capsule (100 mg total) by mouth 2 (two) times daily. Patient taking differently: Take 100 mg by mouth daily. 12/07/20  Yes Love, Ivan Anchors, PA-C  furosemide (LASIX) 40 MG tablet Take 1 tablet (40 mg total) by mouth 2 (two) times daily. One at breakfast. The other with lunch Patient taking differently: Take 40 mg by mouth daily. One at breakfast. 01/18/21  Yes Stacks, [name], MD  Iron, Ferrous Sulfate, 325 (65 Fe) MG TABS Take 1 tablet by mouth daily. Patient taking differently: Take 325 mg by mouth daily. 12/14/20  Yes Stacks, [name], MD  Multiple Vitamins-Minerals (CERTAVITE/ANTIOXIDANTS) TABS Take 1 tablet by mouth daily. 12/08/20  Yes Love, Ivan Anchors, PA-C  pantoprazole (PROTONIX) 40 MG tablet Take 1 tablet (40 mg total) by mouth 2 (two) times daily. Patient taking differently: Take 40 mg by mouth daily. 02/11/21  Yes Thurnell Lose, MD  potassium chloride SA (KLOR-CON) 10 MEQ tablet Take 2 tablets (20 mEq total) by mouth daily. For potassium replacement/ supplement Patient taking differently: Take 10 mEq by mouth daily. 01/18/21 04/18/21 Yes Stacks, [name], MD  tobramycin (TOBREX) 0.3 % ophthalmic solution Place 1 drop into the left eye 2 (two) times daily.   Yes [provider]  melatonin 5 MG TABS Take 1 tablet (5 mg total) by mouth at bedtime. Patient not taking: Reported on 02/25/2021 12/07/20   Love, Ivan Anchors, PA-C  nitroGLYCERIN (NITROSTAT) 0.4 MG SL tablet Place 1 tablet (0.4 mg total) under the tongue every 5 (five) minutes as needed for chest pain. Patient not taking: Reported on 03/05/2021  07/20/15   Erlene Quan, PA-C  senna-docusate (SENOKOT-S) 8.6-50 MG tablet Take 2 tablets by mouth daily with supper. Patient not taking: Reported on 02/25/2021 12/08/20   Bary Leriche, PA-C    Current Facility-Administered Medications  Medication Dose Route Frequency Provider Last Rate Last Admin   acetaminophen (TYLENOL) tablet 650 mg  650 mg Oral Q6H PRN Karmen Bongo, MD       Or   acetaminophen (TYLENOL) suppository 650 mg  650 mg Rectal Q6H PRN Karmen Bongo, MD       carbidopa-levodopa (SINEMET IR) 25-100 MG per tablet immediate release 1 tablet  1 tablet Oral BID Karmen Bongo, MD       hydrALAZINE (APRESOLINE) injection 5 mg  5 mg Intravenous Q4H PRN  Karmen Bongo, MD       lactated ringers infusion   Intravenous Continuous Karmen Bongo, MD 100 mL/hr at 03/18/2021 1230 New Bag at 03/04/2021 1230   melatonin tablet 5 mg  5 mg Oral QHS Karmen Bongo, MD       morphine 2 MG/ML injection 2 mg  2 mg Intravenous Q2H PRN Karmen Bongo, MD       ondansetron [facility]) tablet 4 mg  4 mg Oral Q6H PRN Karmen Bongo, MD       Or   ondansetron [facility]) injection 4 mg  4 mg Intravenous Q6H PRN Karmen Bongo, MD       pantoprazole (PROTONIX) 80 mg /NS 100 mL IVPB  80 mg Intravenous Once Karmen Bongo, MD 300 mL/hr at 02/27/2021 1239 80 mg at 02/21/2021 1239   [START ON 03/16/2021] pantoprazole (PROTONIX) injection 40 mg  40 mg Intravenous Lillia Mountain, MD       pantoprozole (PROTONIX) 80 mg /NS 100 mL infusion  8 mg/hr Intravenous Continuous Karmen Bongo, MD       potassium chloride 10 mEq in 100 mL IVPB  10 mEq Intravenous Q1 Hr x 4 Karmen Bongo, MD 100 mL/hr at 03/17/2021 1232 10 mEq at 02/22/2021 1232   tobramycin (TOBREX) 0.3 % ophthalmic solution 1 drop  1 drop Left Eye BID Karmen Bongo, MD   1 drop at 03/14/2021 1233   Current Outpatient Medications  Medication Sig Dispense Refill   ascorbic acid (VITAMIN C) 500 MG tablet Take 1 tablet (500 mg total) by mouth daily.  30 tablet 1   aspirin 81 MG EC tablet Take 1 tablet (81 mg total) by mouth daily. Swallow whole. 30 tablet 0   carbidopa-levodopa (SINEMET IR) 25-100 MG tablet Take 1.5 tablets by mouth 3 (three) times daily. (Patient taking differently: Take 1 tablet by mouth 2 (two) times daily.) 405 tablet 3   docusate sodium (COLACE) 100 MG capsule Take 1 capsule (100 mg total) by mouth 2 (two) times daily. (Patient taking differently: Take 100 mg by mouth daily.) 60 capsule 0   furosemide (LASIX) 40 MG tablet Take 1 tablet (40 mg total) by mouth 2 (two) times daily. One at breakfast. The other with lunch (Patient taking differently: Take 40 mg by mouth daily. One at breakfast.) 60 tablet 2   Iron, Ferrous Sulfate, 325 (65 Fe) MG TABS Take 1 tablet by mouth daily. (Patient taking differently: Take 325 mg by mouth daily.) 30 tablet 3   Multiple Vitamins-Minerals (CERTAVITE/ANTIOXIDANTS) TABS Take 1 tablet by mouth daily. 30 tablet 0   pantoprazole (PROTONIX) 40 MG tablet Take 1 tablet (40 mg total) by mouth 2 (two) times daily. (Patient taking differently: Take 40 mg by mouth daily.) 60 tablet 0   potassium chloride SA (KLOR-CON) 10 MEQ tablet Take 2 tablets (20 mEq total) by mouth daily. For potassium replacement/ supplement (Patient taking differently: Take 10 mEq by mouth daily.) 180 tablet 0   tobramycin (TOBREX) 0.3 % ophthalmic solution Place 1 drop into the left eye 2 (two) times daily.     melatonin 5 MG TABS Take 1 tablet (5 mg total) by mouth at bedtime. (Patient not taking: Reported on 02/18/2021) 30 tablet 0   nitroGLYCERIN (NITROSTAT) 0.4 MG SL tablet Place 1 tablet (0.4 mg total) under the tongue every 5 (five) minutes as needed for chest pain. (Patient not taking: Reported on 03/14/2021) 25 tablet PRN   senna-docusate (SENOKOT-S) 8.6-50 MG tablet Take 2 tablets by mouth daily  with supper. (Patient not taking: Reported on 03/14/2021) 60 tablet 0    Allergies as of 02/22/2021 - Review Complete  02/22/2021  Allergen Reaction Noted   Crestor [rosuvastatin] Swelling 09/03/2013   Trazodone and nefazodone  12/07/2020    Family History  Problem Relation Age of Onset   Aneurysm Father        Cerebral   Stroke Father    Hypertension Father    Cancer Mother        colon   Parkinson's disease Son     Social History   Socioeconomic History   Marital status: Married    Spouse name: [name]   Number of children: 3   Years of education: 12   Highest education level: High school graduate  Occupational History   Occupation: Retired  Tobacco Use   Smoking status: Never   Smokeless tobacco: Never  Scientific laboratory technician Use: Never used  Substance and Sexual Activity   Alcohol use: Yes    Comment: Rare   Drug use: No   Sexual activity: Not Currently  Other Topics Concern   Not on file  Social History Narrative   Lives at home with wife/sons will stay with them occassionally   Married with 3 children   Denies caffeine use    Social Determinants of Health   Financial Resource Strain: Not on file  Food Insecurity: Not on file  Transportation Needs: Not on file  Physical Activity: Not on file  Stress: Not on file  Social Connections: Not on file  Intimate Partner Violence: Not on file    Review of Systems: See HPI, all other systems reviewed and are negative  Physical Exam: Vital signs in last 24 hours: Temp:  [98.3 F (36.8 C)] 98.3 F (36.8 C) (12/26 0057) Pulse Rate:  [71-84] 76 (12/26 1230) Resp:  [16-22] 21 (12/26 1230) BP: (99-138)/(57-67) 138/64 (12/26 1230) SpO2:  [93 %-98 %] 95 % (12/26 1230) Weight:  [69.4 kg] 69.4 kg (12/26 0121)   General:  Alert 87 year old frail-appearing male. Head:  Normocephalic and atraumatic. Eyes:  No scleral icterus. Conjunctiva pink. Ears:  Normal auditory acuity. Nose:  No deformity, discharge or lesions. Mouth:  Dentition intact. No ulcers or lesions.  Mouth covered with dried dark brown emesis.  Neck:  Supple. No  lymphadenopathy or thyromegaly.  Lungs: Breath sounds clear throughout.  On oxygen 2 L nasal cannula. Heart: Regular rate and rhythm.  3/6 systolic murmur. Abdomen: Soft, nondistended.  Nontender.  Positive bowel sounds all 4 quadrants. Rectal: FOBT positive.  No stool/melena on the bed linen Musculoskeletal:  Symmetrical without gross deformities.  Pulses:  Normal pulses noted. Extremities: Lower extremities with mild edema. Neurologic:  Alert and  oriented x 4. No focal deficits.  Skin:  Intact without significant lesions or rashes. Psych:  Alert and cooperative. Normal mood and affect.  Intake/Output from previous day: No intake/output data recorded. Intake/Output this shift: No intake/output data recorded.  Lab Results: Recent Labs    02/24/2021 0154 02/26/2021 1039  WBC 12.7* 10.5  HGB 10.8* 10.2*  HCT 34.3* 31.7*  PLT 338 320   BMET Recent Labs    03/04/2021 0154  NA 144  K 3.2*  CL 101  CO2 28  GLUCOSE 160*  BUN 24*  CREATININE 2.08*  CALCIUM 9.3   LFT Recent Labs    03/02/2021 0154  PROT 7.6  ALBUMIN 4.1  AST 30  ALT 15  ALKPHOS 94  BILITOT 0.7  PT/INR No results for input(s): LABPROT, INR in the last 72 hours. Hepatitis Panel No results for input(s): HEPBSAG, HCVAB, HEPAIGM, HEPBIGM in the last 72 hours.    Studies/Results: No results found.  IMPRESSION/PLAN:  63) 87 year old year-old male with a history of GERD and a large hiatal hernia and recurrent GI bleed admitted to the hospital with hematemesis and black stool. On ASA.  Hg 10.8. FOBT+. EGD 09/2020 is [facility] showed candidiasis esophagitis, gastritis and a large hiatal hernia without evidence of Cameron lesions. -NPO -Reglan 5mg  IV x 1 now -Ondansetron 4 mg IV every 6 hours next 24 hours then PRN -EGD benefits and risks discussed including risk with sedation, risk of bleeding, perforation and infection.  Patient did not wish to pursue an EGD at this time, he requested time to think it  over.  He is at a high risk for any endoscopic evaluation with anesthesia secondary to his age and multiple comorbidities.  I discussed with the patient if he continued to have active GI bleeding he could have worsening anemia which could result in death.  -Continue PPI infusion -Transfuse for hemoglobin less than 7 -Monitor H&H every 6 hours x 24 hrs -INR  -I attempted to call his son Jeani Hawking but was unable to reach him at this time -Further recommendations per Dr. Rush Landmark   2) History of CAD s/p 4 vessel CABG. AS.   3) CKD stage III  4) History of prostate cancer        Noralyn Pick  03/01/2021, 1:50pm

## 2021-03-13 NOTE — Assessment & Plan Note (Addendum)
-  Patient is presenting with melena and hematemesis, suggestive of upper GI bleeding. -He has known h/o grade D esophagitis with recurrent GI bleeding; most recent hospitalization was 1 month ago. -The patient is not tachycardic with normal blood pressure, suggesting subacute volume loss.  -Will admit to telemetry -GI consulted by ED, will follow up recommendations -NPO for possible EGD -NS at 100 mL/hr -Start IV pantoprazole bolus and infusion given frank bleeding with concern for hemodynamic instability -Zofran IV for nausea -Avoid NSAIDs and SQ heparin -Maintain IV access (2 large bore IVs if possible). -Hold ASA for now -Hgb appears to be stable but will recheck this AM -Type and screen were done in ED.  -Monitor closely and follow cbc q12h, transfuse as necessary for Hbg <7

## 2021-03-13 NOTE — Assessment & Plan Note (Signed)
-  He does not appear to be taking chronic BP medications -Will cover with prn IV hydralazine

## 2021-03-13 NOTE — Assessment & Plan Note (Signed)
Continue Sinemet ?

## 2021-03-13 NOTE — Progress Notes (Signed)
Received a call from the Hospitalist regarding Jon Reid with repeated episodes of coffee-ground emesis and melena in the ER.  BP decreased to 81/59 with HR in the low 90s.  BP improved after starting IVF to 88/61, but due to concerns for ongoing bleeding with hemodynamic changes, GI service asked to reevaluate patient for urgent/emergent endoscopy.  Medical history reviewed.  Please see note from earlier today from the GI service for full details.  He has had multiple admissions this year for upper GI bleed, to include 2 endoscopies in 06/2020 (large HH, LA Grade D esophagitis, normal duodenum) and 09/2020 (Candida esophagitis, resolution of previous severe erosive esophagitis, mild gastritis, large HH).  Was readmitted in 01/2021 and treated conservatively and discharged with high-dose PPI.  In the ER has been treated with PPI gtt., Reglan.  Aside from ASA 81 mg/day, no other antiplatelets or anticoagulation that he is aware of.  Vitals: 106/52, RR 20, HR 97, 91% on 4L [place] Gen: AAOx3, well conversive HEENT: Black material in and around mouth, MMM.  Nasal cannula was not on.  This was reattached during exam with improvement in oxygen saturation to low 90s% at4L CV: RRR Pulm: CTA Abd: Soft, NT, ND  H/H 9.3/28.9 (from 10.2/31.7 FOBT positive BUN/creatinine 24/2.08 on arrival.  Repeat pending PT/INR pending   1) Coffee-ground emesis 2) Melena 3) Acute blood loss anemia 4) Hypotension 5) History of erosive esophagitis 6) Hiatal hernia  - Continue IV PPI gtt. - Continue n.p.o. - BP responsive to IV fluid bolus without need for pressor support at the moment - Plan for emergent EGD now for diagnostic and therapeutic intent given hemodynamic changes earlier in the evening - Plan for admission to the ICU following EGD - Given age and significant comorbidities, MAC assistance for sedation - I discussed this plan with the patient's son and wife by phone who agree with proceeding with EGD now  The  indications, risks, and benefits of EGD were explained to the patient at bedside along with wife and son by phone in detail. Risks include but are not limited to bleeding, perforation, adverse reaction to medications, and cardiopulmonary compromise. Sequelae include but are not limited to the possibility of surgery, prolonged hospitalization, and mortality. The patient verbalized understanding and wished to proceed. All questions answered. Further recommendations pending results of the exam.

## 2021-03-13 NOTE — Assessment & Plan Note (Signed)
-  I have discussed code status with the patient and he would not desire resuscitation and would prefer to die a natural death should that situation arise. -He will need a gold out of facility DNR form at the time of discharge

## 2021-03-13 NOTE — Progress Notes (Signed)
An USGPIV (ultrasound guided PIV) has been placed for short-term vasopressor infusion. A correctly placed ivWatch must be used when administering Vasopressors. Should this treatment be needed beyond 72 hours, central line access should be obtained.  It will be the responsibility of the bedside nurse to follow best practice to prevent extravasations.   ?

## 2021-03-13 NOTE — H&P (View-Only) (Signed)
Received a call from the Hospitalist regarding Jon Reid with repeated episodes of coffee-ground emesis and melena in the ER.  BP decreased to 81/59 with HR in the low 90s.  BP improved after starting IVF to 88/61, but due to concerns for ongoing bleeding with hemodynamic changes, GI service asked to reevaluate patient for urgent/emergent endoscopy. ° °Medical history reviewed.  Please see note from earlier today from the GI service for full details.  He has had multiple admissions this year for upper GI bleed, to include 2 endoscopies in 06/2020 (large HH, LA Grade D esophagitis, normal duodenum) and 09/2020 (Candida esophagitis, resolution of previous severe erosive esophagitis, mild gastritis, large HH).  Was readmitted in 01/2021 and treated conservatively and discharged with high-dose PPI. ° °In the ER has been treated with PPI gtt., Reglan.  Aside from ASA 81 mg/day, no other antiplatelets or anticoagulation that he is aware of. ° °Vitals: 106/52, RR 20, HR 97, 91% on 4L [place] °Gen: AAOx3, well conversive °HEENT: Black material in and around mouth, MMM.  Nasal cannula was not on.  This was reattached during exam with improvement in oxygen saturation to low 90s% at4L °CV: RRR °Pulm: CTA °Abd: Soft, NT, ND ° °H/H 9.3/28.9 (from 10.2/31.7 °FOBT positive °BUN/creatinine 24/2.08 on arrival.  Repeat pending °PT/INR pending ° ° °1) Coffee-ground emesis °2) Melena °3) Acute blood loss anemia °4) Hypotension °5) History of erosive esophagitis °6) Hiatal hernia ° °- Continue IV PPI gtt. °- Continue n.p.o. °- BP responsive to IV fluid bolus without need for pressor support at the moment °- Plan for emergent EGD now for diagnostic and therapeutic intent given hemodynamic changes earlier in the evening °- Plan for admission to the ICU following EGD °- Given age and significant comorbidities, MAC assistance for sedation °- I discussed this plan with the patient's son and wife by phone who agree with proceeding with EGD now ° °The  indications, risks, and benefits of EGD were explained to the patient at bedside along with wife and son by phone in detail. Risks include but are not limited to bleeding, perforation, adverse reaction to medications, and cardiopulmonary compromise. Sequelae include but are not limited to the possibility of surgery, prolonged hospitalization, and mortality. The patient verbalized understanding and wished to proceed. All questions answered. Further recommendations pending results of the exam.  ° ° ° °

## 2021-03-13 NOTE — ED Provider Notes (Signed)
[facility] EMERGENCY DEPARTMENT Provider Note   CSN: [id number] Arrival date & time: 02/16/2021  0014     History Chief Complaint  Patient presents with   Emesis    Jon Reid is a 87 y.o. male.  87 yo M with a chief complaints of hematemesis.  Been going on since yesterday evening.  Patient has had this happen to him a few times in the past.  Most recently was admitted at the end of November for the same.  Had resolution of his symptoms without intervention.  Had an endoscopy back in April thought to be erosive disease.  He is on aspirin.  He denies any abdominal discomfort fever.  The history is provided by the patient and a relative.  Emesis Associated symptoms: no abdominal pain, no arthralgias, no chills, no diarrhea, no fever, no headaches and no myalgias   Illness Severity:  Moderate Onset quality:  Gradual Duration:  12 days Timing:  Constant Progression:  Worsening Chronicity:  New Associated symptoms: nausea and vomiting   Associated symptoms: no abdominal pain, no chest pain, no congestion, no diarrhea, no fever, no headaches, no myalgias, no rash and no shortness of breath       Past Medical History:  Diagnosis Date   Arthritis    Carcinoma of prostate ([place])    prostate   CKD (chronic kidney disease), stage III (HCC)    Coronary artery disease    a. 10/2012 Cath: LM 20-30d, lAD 80p, 58m, 80-90d, LCX 90-88m, OM1 90, RCA 50p, 15m, 50d, RPDA 90, EF nl; b. 10/2012 CABG x 4 (LIMA-LAD, SVG-OM1, SVG-PDA->RPL); c. 07/2015 MV: Basal and proximal septal infarct w/o ischemia. EF 57%-->low risk.   First degree AV block 01/28/2019   Noted on EKG   GERD (gastroesophageal reflux disease)    Gout    HTN (hypertension)    Macrocytosis without anemia 01/08/2012   Moderate aortic stenosis    a. 10/2012 Echo: EF 55-60%, no rwma, BAE, mild AI, mild TR; b. 06/2020 Echo: EF 50-55%, no rwma, mild LVH. Mod dil LA. Mild MR. Mod AS.   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   Sleep apnea    Questionable   Spinal stenosis    Stroke [facility])     Patient Active Problem List   Diagnosis Date Noted   Nausea and vomiting    Acute renal failure superimposed on stage 3 chronic kidney disease ([place]) 02/08/2021   Prolonged QT interval 02/08/2021   Bifascicular block 12/20/2020   Cough 12/12/2020   AKI (acute kidney injury) ([place])    Hypokalemia    Acute on chronic anemia    Protein-calorie malnutrition, severe 11/26/2020   Pressure injury of skin 11/17/2020   Malnutrition of moderate degree 11/15/2020   S/P hip hemiarthroplasty    Closed right hip fracture, initial encounter ([place])    Femur fracture, right ([place]) 11/11/2020   Frail elderly 11/08/2020   History of GI bleed 08/19/2020   Loss of weight 08/19/2020   Dysphagia 08/19/2020   Upper GI bleed 06/25/2020   [name] affect 02/09/2020   Iron deficiency anemia due to chronic blood loss 05/27/2019   Nonrheumatic mitral valve regurgitation 05/26/2019   Left anterior fascicular block 05/26/2019   Goals of care, counseling/discussion 04/06/2019   Constipation 02/19/2019   Elevated troponin 02/09/2019   Fecal occult blood test positive 02/09/2019   Spinal  stenosis, lumbar region with neurogenic claudication 02/05/2019   Leg swelling 01/26/2019   Nonrheumatic aortic valve stenosis 01/26/2019   Multiple joint pain 12/11/2018   Chronic pain of both knees 11/07/2018   Vitamin D deficiency 10/23/2018   Gastroesophageal reflux disease with esophagitis 03/13/2018   Murmur, cardiac 12/04/2017   Coronary artery disease involving native coronary artery of native heart without angina pectoris 12/04/2017   PVC's (premature ventricular contractions) 12/04/2017   Heart disease 08/21/2017   Weakness 11/14/2016   Abnormality of gait 12/06/2015   Abdominal aortic atherosclerosis ([place]) 12/01/2015   Degeneration of lumbar  intervertebral disc 12/01/2015   Exertional angina ([place]) 07/20/2015   Hernia of abdominal wall 10/12/2014   Gynecomastia 04/09/2013   Hx of CABG 02/02/2013   Myelodysplastic syndrome 11/06/2012   Vitamin B12 deficiency 07/07/2012   Hyperlipemia 07/07/2012   Peripheral vascular disease ([place]) 04/14/2009   CARCINOMA, PROSTATE 04/13/2009   Non-thrombocytopenic purpura ([place]) 04/13/2009   Benign essential hypertension 04/13/2009   Chronic renal insufficiency, stage III (moderate) ([place]) 04/13/2009   SLEEP APNEA 04/13/2009    Past Surgical History:  Procedure Laterality Date   BALLOON DILATION N/A 10/03/2020   Procedure: BALLOON DILATION;  Surgeon: [name], DO;  [facility];  Service: Endoscopy;  Laterality: N/A;   CARDIAC CATHETERIZATION  11/07/2012   Dr Acie Fredrickson   CATARACT EXTRACTION W/ INTRAOCULAR LENS IMPLANT Bilateral    CORONARY ARTERY BYPASS GRAFT N/A 11/11/2012   Procedure: CORONARY ARTERY BYPASS GRAFTING times four using Right Greater Saphenous Vein Graft harvested endoscopically and Left Internal Mammary Artery.;  Surgeon: Ivin Poot, MD;  Location: [facility];  Service: Open Heart Surgery;  Laterality: N/A;   ESOPHAGEAL BRUSHING  10/03/2020   Procedure: ESOPHAGEAL BRUSHING;  Surgeon: [name], DO;  [facility];  Service: Endoscopy;;   ESOPHAGOGASTRODUODENOSCOPY (EGD) WITH PROPOFOL N/A 06/27/2020   Surgeon: [name], DO;  large hiatal hernia, LA grade D esophagitis with no bleeding, normal examined duodenum.    ESOPHAGOGASTRODUODENOSCOPY (EGD) WITH PROPOFOL N/A 10/03/2020   Procedure: ESOPHAGOGASTRODUODENOSCOPY (EGD) WITH PROPOFOL;  Surgeon: [name], DO;  [facility];  Service: Endoscopy;  Laterality: N/A;  2:00pm   HIP ARTHROPLASTY Right 11/13/2020   Procedure: ARTHROPLASTY BIPOLAR HIP (HEMIARTHROPLASTY);  Surgeon: Renette Butters, MD;  Location: [facility];  Service: Orthopedics;  Laterality: Right;   INGUINAL HERNIA  REPAIR Right 01/10/2017   Procedure: OPEN RIGHT HERNIA REPAIR INGUINAL;  Surgeon: Ileana Roup, MD;  Location: [facility];  Service: General;  Laterality: Right;   INSERTION OF MESH Right 01/10/2017   Procedure: INSERTION OF MESH;  Surgeon: Ileana Roup, MD;  Location: [facility];  Service: General;  Laterality: Right;   INTRAOPERATIVE TRANSESOPHAGEAL ECHOCARDIOGRAM N/A 11/11/2012   Procedure: INTRAOPERATIVE TRANSESOPHAGEAL ECHOCARDIOGRAM;  Surgeon: Ivin Poot, MD;  Location: [facility];  Service: Open Heart Surgery;  Laterality: N/A;   LEFT HEART CATHETERIZATION WITH CORONARY ANGIOGRAM N/A 11/07/2012   Procedure: LEFT HEART CATHETERIZATION WITH CORONARY ANGIOGRAM;  Surgeon: Thayer Headings, MD;  Location: [facility] CATH LAB;  Service: Cardiovascular;  Laterality: N/A;   LUMBAR LAMINECTOMY/DECOMPRESSION MICRODISCECTOMY N/A 02/05/2019   Procedure: LUMBAR LAMINECTOMY/DECOMPRESSION L3-L4;  Surgeon: Latanya Maudlin, MD;  Location: [facility];  Service: Orthopedics;  Laterality: N/A;  76min   LYMPH NODE DISSECTION     Bilateral pelvic   RETROPUBIC PROSTATECTOMY     Radical       Family History  Problem Relation Age of Onset  Aneurysm Father        Cerebral   Stroke Father    Hypertension Father    Cancer Mother        colon   Parkinson's disease Son     Social History   Tobacco Use   Smoking status: Never   Smokeless tobacco: Never  Vaping Use   Vaping Use: Never used  Substance Use Topics   Alcohol use: Yes    Comment: Rare   Drug use: No    Home Medications Prior to Admission medications   Medication Sig Start Date End Date Taking? Authorizing Provider  ascorbic acid (VITAMIN C) 500 MG tablet Take 1 tablet (500 mg total) by mouth daily. 06/30/20   Barton Dubois, MD  aspirin 81 MG EC tablet Take 1 tablet (81 mg total) by mouth daily. Swallow whole. 12/08/20   Love, Ivan Anchors, PA-C  carbidopa-levodopa (SINEMET IR) 25-100 MG tablet Take 1.5 tablets by mouth 3 (three) times  daily. Patient taking differently: Take 1.5 tablets by mouth 2 (two) times daily. 09/21/20   Kathrynn Ducking, MD  Cholecalciferol (VITAMIN D) 2000 units CAPS Take 2,000 Units by mouth daily.    [provider]  docusate sodium (COLACE) 100 MG capsule Take 1 capsule (100 mg total) by mouth 2 (two) times daily. 12/07/20   Love, Ivan Anchors, PA-C  fluticasone (FLONASE) 50 MCG/ACT nasal spray One to 2 sprays each nostril at bedtime Patient taking differently: Place 2 sprays into both nostrils daily as needed for allergies. 10/12/14   Chipper Herb, MD  furosemide (LASIX) 40 MG tablet Take 1 tablet (40 mg total) by mouth 2 (two) times daily. One at breakfast. The other with lunch Patient taking differently: Take 40 mg by mouth daily. One at breakfast. 01/18/21   Claretta Fraise, MD  guaiFENesin (MUCINEX) 600 MG 12 hr tablet Take 600 mg by mouth 2 (two) times daily as needed for cough.    [provider]  Iron, Ferrous Sulfate, 325 (65 Fe) MG TABS Take 1 tablet by mouth daily. Patient taking differently: Take 325 mg by mouth daily. 12/14/20   Claretta Fraise, MD  melatonin 5 MG TABS Take 1 tablet (5 mg total) by mouth at bedtime. 12/07/20   Love, Ivan Anchors, PA-C  Multiple Vitamins-Minerals (CERTAVITE/ANTIOXIDANTS) TABS Take 1 tablet by mouth daily. 12/08/20   Love, Ivan Anchors, PA-C  nitroGLYCERIN (NITROSTAT) 0.4 MG SL tablet Place 1 tablet (0.4 mg total) under the tongue every 5 (five) minutes as needed for chest pain. 07/20/15   Erlene Quan, PA-C  pantoprazole (PROTONIX) 40 MG tablet Take 1 tablet (40 mg total) by mouth 2 (two) times daily. 02/11/21   Thurnell Lose, MD  potassium chloride SA (KLOR-CON) 10 MEQ tablet Take 2 tablets (20 mEq total) by mouth daily. For potassium replacement/ supplement 01/18/21 04/18/21  Claretta Fraise, MD  senna-docusate (SENOKOT-S) 8.6-50 MG tablet Take 2 tablets by mouth daily with supper. 12/08/20   Love, Ivan Anchors, PA-C  triamcinolone cream (KENALOG) 0.1 % Apply  1 application topically daily as needed (itching). 06/29/20   Barton Dubois, MD    Allergies    Crestor [rosuvastatin] and Trazodone and nefazodone  Review of Systems   Review of Systems  Constitutional:  Negative for chills and fever.  HENT:  Negative for congestion and facial swelling.   Eyes:  Negative for discharge and visual disturbance.  Respiratory:  Negative for shortness of breath.   Cardiovascular:  Negative for chest pain  and palpitations.  Gastrointestinal:  Positive for blood in stool, nausea and vomiting. Negative for abdominal pain and diarrhea.  Musculoskeletal:  Negative for arthralgias and myalgias.  Skin:  Negative for color change and rash.  Neurological:  Negative for tremors, syncope and headaches.  Psychiatric/Behavioral:  Negative for confusion and dysphoric mood.    Physical Exam Updated Vital Signs BP (!) 126/57 (BP Location: Right Arm)    Pulse 84    Temp 98.3 F (36.8 C) (Oral)    Resp 20    Ht 5\' 6"  (1.676 m)    Wt 69.4 kg    SpO2 98%    BMI 24.69 kg/m   Physical Exam Vitals and nursing note reviewed.  Constitutional:      Appearance: He is well-developed.  HENT:     Head: Normocephalic and atraumatic.  Eyes:     Pupils: Pupils are equal, round, and reactive to light.  Neck:     Vascular: No JVD.  Cardiovascular:     Rate and Rhythm: Normal rate and regular rhythm.     Heart sounds: No murmur heard.   No friction rub. No gallop.  Pulmonary:     Effort: No respiratory distress.     Breath sounds: No wheezing.  Abdominal:     General: There is no distension.     Tenderness: There is no abdominal tenderness. There is no guarding or rebound.  Musculoskeletal:        General: Normal range of motion.     Cervical back: Normal range of motion and neck supple.     Right lower leg: Edema present.     Left lower leg: Edema present.     Comments: 2+ edema to ble up to the knees  Skin:    Coloration: Skin is not pale.     Findings: No rash.   Neurological:     Mental Status: He is alert and oriented to person, place, and time.  Psychiatric:        Behavior: Behavior normal.    ED Results / Procedures / Treatments   Labs (all labs ordered are listed, but only abnormal results are displayed) Labs Reviewed  COMPREHENSIVE METABOLIC PANEL - Abnormal; Notable for the following components:      Result Value   Potassium 3.2 (*)    Glucose, Bld 160 (*)    BUN 24 (*)    Creatinine, Ser 2.08 (*)    GFR, Estimated 30 (*)    All other components within normal limits  CBC - Abnormal; Notable for the following components:   WBC 12.7 (*)    RBC 3.44 (*)    Hemoglobin 10.8 (*)    HCT 34.3 (*)    RDW 17.3 (*)    All other components within normal limits  POC OCCULT BLOOD, ED - Abnormal; Notable for the following components:   Fecal Occult Bld POSITIVE (*)    All other components within normal limits  CBC  TYPE AND SCREEN    EKG None  Radiology No results found.  Procedures Procedures   Medications Ordered in ED Medications  pantoprazole (PROTONIX) injection 40 mg (has no administration in time range)  ondansetron (ZOFRAN) injection 4 mg (has no administration in time range)    ED Course  I have reviewed the triage vital signs and the nursing notes.  Pertinent labs & imaging results that were available during my care of the patient were reviewed by me and considered in my medical decision making (  see chart for details).    MDM Rules/Calculators/A&P                         87 yo M with a chief complaints of hematemesis.  Has been vomiting coffee-ground like emesis since last night.  Had a prolonged wait in the waiting room and ended up having worsening vomiting and loose bowel movement that was concern for bloody stool.  He was Hemoccult positive.  Hemoglobin without significant drop will obtain a repeat now.  Give a dose of Protonix.  Will discuss with GI.  I discussed the case with Cottie Banda, [facility].   Agreed with hospitalist admission and will see today.    Will discuss with medicine.   The patients results and plan were reviewed and discussed.   Any x-rays performed were independently reviewed by myself.   Differential diagnosis were considered with the presenting HPI.  Medications  pantoprazole (PROTONIX) injection 40 mg (has no administration in time range)  ondansetron (ZOFRAN) injection 4 mg (has no administration in time range)    Vitals:   02/25/2021 0057 02/22/2021 0121 02/18/2021 0522 03/01/2021 0755  BP: (!) 118/58  99/62 (!) 126/57  Pulse: 74  83 84  Resp: 16  16 20   Temp: 98.3 F (36.8 C)     TempSrc: Oral     SpO2: 97%  97% 98%  Weight:  69.4 kg    Height:  5\' 6"  (1.676 m)      Final diagnoses:  Upper GI bleed    Admission/ observation were discussed with the admitting physician, patient and/or family and they are comfortable with the plan.      Final Clinical Impression(s) / ED Diagnoses Final diagnoses:  Upper GI bleed    Rx / DC Orders ED Discharge Orders     None        Deno Etienne, DO 03/06/2021 [phone]

## 2021-03-13 NOTE — Telephone Encounter (Signed)
Noted  

## 2021-03-13 NOTE — Transfer of Care (Signed)
Immediate Anesthesia Transfer of Care Note  Patient: KALON ERHARDT  Procedure(s) Performed: ESOPHAGOGASTRODUODENOSCOPY (EGD) (Left)  Patient Location: ICU  Anesthesia Type:General  Level of Consciousness: sedated and Patient remains intubated per anesthesia plan  Airway & Oxygen Therapy: Patient remains intubated per anesthesia plan and Patient placed on Ventilator (see vital sign flow sheet for setting)  Post-op Assessment: Report given to RN and Post -op Vital signs reviewed and stable  Post vital signs: Reviewed and stable  Last Vitals:  Vitals Value Taken Time  BP 145/60   Temp    Pulse 103 03/03/2021 2058  Resp 18 02/28/2021 2058  SpO2 100 % 02/25/2021 2058  Vitals shown include unvalidated device data.  Last Pain:  Vitals:   03/12/2021 1855  TempSrc:   PainSc: 0-No pain         Complications: No notable events documented.

## 2021-03-13 NOTE — Progress Notes (Signed)
Patient has continued to have significant GI hemorrhage.  He is currently having melena and black emesis.  His BP has decreased although he is not currently hypotensive.  I have discussed with the patient and offered comfort measures vs. EGD; he is now in agreement with urgent EGD.  He is failing conservative therapy.  As such, I have consulted PCCM for ICU transfer and also have notified Dr. Bryan Lemma of urgent need for EGD.     Carlyon Shadow, M.D.

## 2021-03-14 ENCOUNTER — Encounter (HOSPITAL_COMMUNITY): Payer: Self-pay | Admitting: Gastroenterology

## 2021-03-14 ENCOUNTER — Inpatient Hospital Stay (HOSPITAL_COMMUNITY): Payer: MEDICARE

## 2021-03-14 DIAGNOSIS — K2101 Gastro-esophageal reflux disease with esophagitis, with bleeding: Secondary | ICD-10-CM

## 2021-03-14 DIAGNOSIS — R579 Shock, unspecified: Secondary | ICD-10-CM | POA: Diagnosis present

## 2021-03-14 DIAGNOSIS — I959 Hypotension, unspecified: Secondary | ICD-10-CM

## 2021-03-14 DIAGNOSIS — K921 Melena: Secondary | ICD-10-CM | POA: Diagnosis not present

## 2021-03-14 DIAGNOSIS — Z978 Presence of other specified devices: Secondary | ICD-10-CM | POA: Diagnosis present

## 2021-03-14 DIAGNOSIS — K920 Hematemesis: Secondary | ICD-10-CM | POA: Diagnosis not present

## 2021-03-14 LAB — BASIC METABOLIC PANEL
Anion gap: 13 (ref 5–15)
BUN: 49 mg/dL — ABNORMAL HIGH (ref 8–23)
CO2: 32 mmol/L (ref 22–32)
Calcium: 7.6 mg/dL — ABNORMAL LOW (ref 8.9–10.3)
Chloride: 97 mmol/L — ABNORMAL LOW (ref 98–111)
Creatinine, Ser: 3.34 mg/dL — ABNORMAL HIGH (ref 0.61–1.24)
GFR, Estimated: 17 mL/min — ABNORMAL LOW (ref >60)
Glucose, Bld: 149 mg/dL — ABNORMAL HIGH (ref 70–99)
Potassium: 3.8 mmol/L (ref 3.5–5.1)
Sodium: 142 mmol/L (ref 135–145)

## 2021-03-14 LAB — URINALYSIS, ROUTINE W REFLEX MICROSCOPIC
Glucose, UA: NEGATIVE mg/dL
Hgb urine dipstick: NEGATIVE
Ketones, ur: NEGATIVE mg/dL
Nitrite: NEGATIVE
Protein, ur: NEGATIVE mg/dL
Specific Gravity, Urine: 1.025 (ref 1.005–1.030)
pH: 5.5 (ref 5.0–8.0)

## 2021-03-14 LAB — URINALYSIS, MICROSCOPIC (REFLEX)

## 2021-03-14 LAB — POCT I-STAT 7, (LYTES, BLD GAS, ICA,H+H)
Acid-Base Excess: 14 mmol/L — ABNORMAL HIGH (ref 0.0–2.0)
Acid-Base Excess: 16 mmol/L — ABNORMAL HIGH (ref 0.0–2.0)
Acid-Base Excess: 12 mmol/L — ABNORMAL HIGH (ref 0.0–2.0)
Acid-Base Excess: 11 mmol/L — ABNORMAL HIGH (ref 0.0–2.0)
Bicarbonate: 37.4 mmol/L — ABNORMAL HIGH (ref 20.0–28.0)
Bicarbonate: 39.1 mmol/L — ABNORMAL HIGH (ref 20.0–28.0)
Bicarbonate: 35.5 mmol/L — ABNORMAL HIGH (ref 20.0–28.0)
Bicarbonate: 34.9 mmol/L — ABNORMAL HIGH (ref 20.0–28.0)
Calcium, Ion: 0.95 mmol/L — ABNORMAL LOW (ref 1.15–1.40)
Calcium, Ion: 0.98 mmol/L — ABNORMAL LOW (ref 1.15–1.40)
Calcium, Ion: 0.97 mmol/L — ABNORMAL LOW (ref 1.15–1.40)
Calcium, Ion: 0.98 mmol/L — ABNORMAL LOW (ref 1.15–1.40)
HCT: 29 % — ABNORMAL LOW (ref 39.0–52.0)
HCT: 28 % — ABNORMAL LOW (ref 39.0–52.0)
HCT: 28 % — ABNORMAL LOW (ref 39.0–52.0)
HCT: 29 % — ABNORMAL LOW (ref 39.0–52.0)
Hemoglobin: 9.9 g/dL — ABNORMAL LOW (ref 13.0–17.0)
Hemoglobin: 9.5 g/dL — ABNORMAL LOW (ref 13.0–17.0)
Hemoglobin: 9.5 g/dL — ABNORMAL LOW (ref 13.0–17.0)
Hemoglobin: 9.9 g/dL — ABNORMAL LOW (ref 13.0–17.0)
O2 Saturation: 96 %
O2 Saturation: 97 %
O2 Saturation: 90 %
O2 Saturation: 89 %
Patient temperature: 98.2
Patient temperature: 98.2
Patient temperature: 100
Potassium: 3.7 mmol/L (ref 3.5–5.1)
Potassium: 3.7 mmol/L (ref 3.5–5.1)
Potassium: 3.5 mmol/L (ref 3.5–5.1)
Potassium: 3.4 mmol/L — ABNORMAL LOW (ref 3.5–5.1)
Sodium: 143 mmol/L (ref 135–145)
Sodium: 144 mmol/L (ref 135–145)
Sodium: 144 mmol/L (ref 135–145)
Sodium: 144 mmol/L (ref 135–145)
TCO2: 39 mmol/L — ABNORMAL HIGH (ref 22–32)
TCO2: 40 mmol/L — ABNORMAL HIGH (ref 22–32)
TCO2: 37 mmol/L — ABNORMAL HIGH (ref 22–32)
TCO2: 36 mmol/L — ABNORMAL HIGH (ref 22–32)
pCO2 arterial: 41.8 mmHg (ref 32.0–48.0)
pCO2 arterial: 42.2 mmHg (ref 32.0–48.0)
pCO2 arterial: 41.2 mmHg (ref 32.0–48.0)
pCO2 arterial: 43.2 mmHg (ref 32.0–48.0)
pH, Arterial: 7.56 — ABNORMAL HIGH (ref 7.350–7.450)
pH, Arterial: 7.574 — ABNORMAL HIGH (ref 7.350–7.450)
pH, Arterial: 7.542 — ABNORMAL HIGH (ref 7.350–7.450)
pH, Arterial: 7.518 — ABNORMAL HIGH (ref 7.350–7.450)
pO2, Arterial: 71 mmHg — ABNORMAL LOW (ref 83.0–108.0)
pO2, Arterial: 78 mmHg — ABNORMAL LOW (ref 83.0–108.0)
pO2, Arterial: 51 mmHg — ABNORMAL LOW (ref 83.0–108.0)
pO2, Arterial: 53 mmHg — ABNORMAL LOW (ref 83.0–108.0)

## 2021-03-14 LAB — GLUCOSE, CAPILLARY
Glucose-Capillary: 154 mg/dL — ABNORMAL HIGH (ref 70–99)
Glucose-Capillary: 123 mg/dL — ABNORMAL HIGH (ref 70–99)
Glucose-Capillary: 109 mg/dL — ABNORMAL HIGH (ref 70–99)
Glucose-Capillary: 93 mg/dL (ref 70–99)
Glucose-Capillary: 109 mg/dL — ABNORMAL HIGH (ref 70–99)

## 2021-03-14 LAB — CBC
HCT: 28.3 % — ABNORMAL LOW (ref 39.0–52.0)
HCT: 28.5 % — ABNORMAL LOW (ref 39.0–52.0)
Hemoglobin: 9.4 g/dL — ABNORMAL LOW (ref 13.0–17.0)
Hemoglobin: 9.2 g/dL — ABNORMAL LOW (ref 13.0–17.0)
MCH: 31.8 pg (ref 26.0–34.0)
MCH: 31.2 pg (ref 26.0–34.0)
MCHC: 33.2 g/dL (ref 30.0–36.0)
MCHC: 32.3 g/dL (ref 30.0–36.0)
MCV: 95.6 fL (ref 80.0–100.0)
MCV: 96.6 fL (ref 80.0–100.0)
Platelets: 262 10*3/uL (ref 150–400)
Platelets: 199 10*3/uL (ref 150–400)
RBC: 2.96 MIL/uL — ABNORMAL LOW (ref 4.22–5.81)
RBC: 2.95 MIL/uL — ABNORMAL LOW (ref 4.22–5.81)
RDW: 18.8 % — ABNORMAL HIGH (ref 11.5–15.5)
RDW: 19.1 % — ABNORMAL HIGH (ref 11.5–15.5)
WBC: 6.3 10*3/uL (ref 4.0–10.5)
WBC: 3.1 10*3/uL — ABNORMAL LOW (ref 4.0–10.5)
nRBC: 0 % (ref 0.0–0.2)
nRBC: 0 % (ref 0.0–0.2)

## 2021-03-14 LAB — MAGNESIUM: Magnesium: 2 mg/dL (ref 1.7–2.4)

## 2021-03-14 LAB — LACTIC ACID, PLASMA
Lactic Acid, Venous: 2.9 mmol/L (ref 0.5–1.9)
Lactic Acid, Venous: 2.3 mmol/L (ref 0.5–1.9)
Lactic Acid, Venous: 2.4 mmol/L (ref 0.5–1.9)

## 2021-03-14 LAB — TRIGLYCERIDES: Triglycerides: 69 mg/dL (ref <150)

## 2021-03-14 MED ORDER — NOREPINEPHRINE 4 MG/250ML-% IV SOLN
0.0000 ug/min | INTRAVENOUS | Status: DC
Start: 2021-03-14 — End: 2021-03-14
  Administered 2021-03-14: 13:00:00 2 ug/min via INTRAVENOUS
  Filled 2021-03-14 (×2): qty 250

## 2021-03-14 MED ORDER — LACTATED RINGERS IV BOLUS
500.0000 mL | Freq: Once | INTRAVENOUS | Status: AC
  Administered 2021-03-14: 10:00:00 500 mL via INTRAVENOUS

## 2021-03-14 MED ORDER — NOREPINEPHRINE 4 MG/250ML-% IV SOLN: 2.0000 ug/min | INTRAVENOUS | Status: DC

## 2021-03-14 MED ORDER — NOREPINEPHRINE 4 MG/250ML-% IV SOLN
0.0000 ug/min | INTRAVENOUS | Status: DC
  Administered 2021-03-14: 19:00:00 13 ug/min via INTRAVENOUS

## 2021-03-14 MED ORDER — METOCLOPRAMIDE HCL 5 MG/ML IJ SOLN
10.0000 mg | Freq: Once | INTRAMUSCULAR | Status: AC
  Administered 2021-03-15: 09:00:00 10 mg via INTRAVENOUS
  Filled 2021-03-14: qty 2

## 2021-03-14 MED ORDER — NOREPINEPHRINE 16 MG/250ML-% IV SOLN
0.0000 ug/min | INTRAVENOUS | Status: DC
  Administered 2021-03-14: 23:00:00 18 ug/min via INTRAVENOUS
  Administered 2021-03-15: 20:00:00 9 ug/min via INTRAVENOUS
  Administered 2021-03-16: 08:00:00 4 ug/min via INTRAVENOUS
  Filled 2021-03-14 (×2): qty 250

## 2021-03-14 MED ORDER — SODIUM CHLORIDE 0.9 % IV SOLN
3.0000 g | Freq: Two times a day (BID) | INTRAVENOUS | Status: DC
  Administered 2021-03-14 (×2): 3 g via INTRAVENOUS
  Filled 2021-03-14 (×3): qty 8

## 2021-03-14 MED ORDER — LACTATED RINGERS IV BOLUS: 500.0000 mL | Freq: Once | INTRAVENOUS | Status: AC

## 2021-03-14 MED ORDER — MELATONIN 5 MG PO TABS
5.0000 mg | ORAL_TABLET | Freq: Every day | ORAL | Status: DC
  Administered 2021-03-14: 22:00:00 5 mg
  Filled 2021-03-14: qty 1

## 2021-03-14 MED ORDER — LACTATED RINGERS IV BOLUS
500.0000 mL | Freq: Once | INTRAVENOUS | Status: AC
  Administered 2021-03-14: 11:00:00 500 mL via INTRAVENOUS

## 2021-03-14 MED ORDER — CARBIDOPA-LEVODOPA 25-100 MG PO TABS
1.0000 | ORAL_TABLET | Freq: Two times a day (BID) | ORAL | Status: DC
  Administered 2021-03-14 – 2021-03-19 (×12): 1
  Filled 2021-03-14 (×14): qty 1

## 2021-03-14 MED ORDER — CARBIDOPA-LEVODOPA 25-100 MG PO TABS: 1.0000 | ORAL_TABLET | Freq: Two times a day (BID) | ORAL | Status: DC

## 2021-03-14 NOTE — Plan of Care (Signed)
°  Problem: Education: Goal: Knowledge of General Education information will improve Description: Including pain rating scale, medication(s)/side effects and non-pharmacologic comfort measures Outcome: Progressing   Problem: Health Behavior/Discharge Planning: Goal: Ability to manage health-related needs will improve Outcome: Progressing   Problem: Clinical Measurements: Goal: Ability to maintain clinical measurements within normal limits will improve Outcome: Progressing Goal: Will remain free from infection Outcome: Progressing Goal: Diagnostic test results will improve Outcome: Progressing Goal: Respiratory complications will improve Outcome: Progressing Goal: Cardiovascular complication will be avoided Outcome: Progressing   Problem: Nutrition: Goal: Adequate nutrition will be maintained Outcome: Progressing   Problem: Coping: Goal: Level of anxiety will decrease Outcome: Progressing   Problem: Elimination: Goal: Will not experience complications related to bowel motility Outcome: Progressing Goal: Will not experience complications related to urinary retention Outcome: Progressing   Problem: Safety: Goal: Ability to remain free from injury will improve Outcome: Progressing   Problem: Skin Integrity: Goal: Risk for impaired skin integrity will decrease Outcome: Progressing   Problem: Fluid Volume: Goal: Will show no signs and symptoms of excessive bleeding Outcome: Progressing   Problem: Clinical Measurements: Goal: Complications related to the disease process, condition or treatment will be avoided or minimized Outcome: Progressing

## 2021-03-14 NOTE — Anesthesia Postprocedure Evaluation (Signed)
Anesthesia Post Note  Patient: SIVAN QUAST  Procedure(s) Performed: ESOPHAGOGASTRODUODENOSCOPY (EGD) (Left)     Patient location during evaluation: ICU Anesthesia Type: General Level of consciousness: sedated and patient remains intubated per anesthesia plan Pain management: pain level controlled Vital Signs Assessment: post-procedure vital signs reviewed and stable Respiratory status: patient remains intubated per anesthesia plan and patient on ventilator - see flowsheet for VS Cardiovascular status: stable Postop Assessment: no apparent nausea or vomiting Anesthetic complications: no Comments: CXR with bilateral infiltrative process (Pt's pre-op O2 sat 88% on 4l O2 [place], and evidence of aspiration in ETT when suctioned at intubation)     No notable events documented.  Last Vitals:  Vitals:   03/14/21 0550 03/14/21 0600  BP: (!) 112/52 110/60  Pulse: 88 89  Resp:    Temp:    SpO2: 93% 93%    Last Pain:  Vitals:   03/14/21 0400  TempSrc:   PainSc: 0-No pain                 Callen Zuba,E. Ronal Maybury

## 2021-03-14 NOTE — Procedures (Signed)
Central Venous Catheter Insertion Procedure Note  TARAY NORMOYLE  [id number]  04-08-1928  Date:03/14/21  Time:6:43 PM   Provider Performing:Brooke Moshe Cipro   Procedure: Insertion of Non-tunneled Central Venous [phone]) with US guidance ([id number])   Indication(s) Medication administration  Consent Risks of the procedure as well as the alternatives and risks of each were explained to the patient and/or caregiver.  Consent for the procedure was obtained and is signed in the bedside chart  Anesthesia Topical only with 1% lidocaine   Timeout Verified patient identification, verified procedure, site/side was marked, verified correct patient position, special equipment/implants available, medications/allergies/relevant history reviewed, required imaging and test results available.  Sterile Technique Maximal sterile technique including full sterile barrier drape, hand hygiene, sterile gown, sterile gloves, mask, hair covering, sterile ultrasound probe cover (if used).  Procedure Description Area of catheter insertion was cleaned with chlorhexidine and draped in sterile fashion.  With real-time ultrasound guidance a central venous catheter was placed into the left internal jugular vein. Nonpulsatile blood flow and easy flushing noted in all ports.  The catheter was sutured in place and sterile dressing applied.     Complications/Tolerance None; patient tolerated the procedure well. Chest X-ray is ordered to verify placement for internal jugular or subclavian cannulation.   Chest x-ray is not ordered for femoral cannulation.  EBL Minimal  Specimen(s) None     Kennieth Rad, ACNP [facility] 03/14/2021, 6:43 PM  See Amion for pager If no response to pager, please call PCCM consult pager After 7:00 pm call Elink

## 2021-03-14 NOTE — H&P (View-Only) (Signed)
° °       Daily Rounding Note ° °03/14/2021, 9:21 AM ° LOS: 1 day  ° °SUBJECTIVE:   °Chief complaint:  CGE, melena, HH, esophagitis, retained esoph and gastric fluid.   ° °Remains on vent on Precedex and phenylephrine.  °No recorded output from NGT but has about 2 to 30 mL in canister currently.  No stools reported.    ° °OBJECTIVE:        ° Vital signs in last 24 hours:    °Temp:  [97.9 °F (36.6 °C)-101.1 °F (38.4 °C)] 101.1 °F (38.4 °C) (12/27 0740) °Pulse Rate:  [30-110] 85 (12/27 0800) °Resp:  [14-31] 19 (12/27 0714) °BP: (74-163)/(35-75) 102/55 (12/27 0800) °SpO2:  [81 %-100 %] 90 % (12/27 0800) °FiO2 (%):  [60 %-100 %] 60 % (12/27 0714) °Weight:  [64.8 kg-69.3 kg] 69.3 kg (12/27 0500) °  °Filed Weights  ° 03/17/2021 0121 03/18/2021 2130 03/14/21 0500  °Weight: 69.4 kg 64.8 kg 69.3 kg  ° °General: sedated on vent, no response to exam   °Heart: RRR w murmer °Chest: even, non-labored breathing on vent °Abdomen: soft, thin, NT.  BS normal but scant/hypoactive.  NGT canister w small volume espresso--colored liquid   °Extremities: feet warm, no LE edema.  Purpura, dusky discoloration on forearms °Neuro/Psych:  unresponsive, sedated.   ° °Intake/Output from previous day: °12/26 0701 - 12/27 0700 °In: 3916.6 [I.V.:2951.6; Blood:315; IV Piggyback:650] °Out: -  ° °Intake/Output this shift: °No intake/output data recorded. ° °Lab Results: °Recent Labs  °  03/05/2021 °1714 03/14/2021 °1930 02/17/2021 °2146 03/17/2021 °2147 03/14/21 °0138 03/14/21 °0310 03/14/21 °0421  °WBC 14.0*  --  9.6  --   --   --  6.3  °HGB 9.3*   < > 9.6*   < > 9.9* 9.5* 9.4*  °HCT 28.9*   < > 29.3*   < > 29.0* 28.0* 28.3*  °PLT 327  --  264  --   --   --  262  ° < > = values in this interval not displayed.  ° °BMET °Recent Labs  °  03/18/2021 °0154 02/27/2021 °1930 03/02/2021 °2146 03/12/2021 °2147 03/14/21 °0138 03/14/21 °0310 03/14/21 °0421  °NA 144   < > 146*   < > 143 144 142  °K 3.2*   < > 3.6   < > 3.7 3.7 3.8   °CL 101  --  94*  --   --   --  97*  °CO2 28  --  35*  --   --   --  32  °GLUCOSE 160*  --  146*  --   --   --  149*  °BUN 24*  --  40*  --   --   --  49*  °CREATININE 2.08*  --  3.37*  --   --   --  3.34*  °CALCIUM 9.3  --  8.3*  --   --   --  7.6*  ° < > = values in this interval not displayed.  ° °LFT °Recent Labs  °  02/20/2021 °0154  °PROT 7.6  °ALBUMIN 4.1  °AST 30  °ALT 15  °ALKPHOS 94  °BILITOT 0.7  ° °PT/INR °Recent Labs  °  02/18/2021 °1714  °LABPROT 14.4  °INR 1.1  ° ° °Studies/Results: °DG Chest Port 1 View ° °Result Date: 03/14/2021 °CLINICAL DATA:  Hypoxia, emesis, intubated EXAM: PORTABLE CHEST 1 VIEW COMPARISON:  Chest radiograph 1 day prior FINDINGS: The endotracheal tube tip is approximately   3.9 cm from the carina. The enteric catheter is coiled in the stomach with the tip off the field of view. Median sternotomy wires are stable. The cardiomediastinal silhouette is stable. There is patchy and confluent airspace disease in both lung bases extending to the left mid lung. Overall, aeration has worsened since the study from 1 day prior. There are small bilateral pleural effusions, increased on the right. There is no pneumothorax. The bones are stable. IMPRESSION: 1. Endotracheal tube approximally 3.9 cm from the carina. 2. Patchy and confluent airspace disease in both lower lobes and left midlung, overall worsened since the study from 1 day prior. 3. Small bilateral pleural effusions, increased on the right. Electronically Signed   By: Peter  Noone M.D.   On: 03/14/2021 08:41  ° °DG Chest Port 1 View ° °Result Date: 02/28/2021 °CLINICAL DATA:  Endotracheal tube placement EXAM: PORTABLE CHEST 1 VIEW COMPARISON:  02/23/2021 FINDINGS: An endotracheal tube has been placed with tip measuring 6.6 cm above the carina. An enteric tube has been placed with tip in the left upper quadrant consistent with location in the body of the stomach. Heart size is normal. Infiltration or atelectasis is increasing in the lung  bases, particularly on the right. Hiatal hernia behind the heart is decreased. Calcification of the aorta. IMPRESSION: Appliances are in satisfactory position. Increasing atelectasis or infiltration in the lung bases. Electronically Signed   By: William  Stevens M.D.   On: 03/01/2021 22:07   ° °Scheduled Meds: ° sodium chloride   Intravenous Once  ° carbidopa-levodopa  1 tablet Oral BID  ° chlorhexidine gluconate (MEDLINE KIT)  15 mL Mouth Rinse BID  ° Chlorhexidine Gluconate Cloth  6 each Topical Daily  ° docusate  100 mg Per Tube BID  ° mouth rinse  15 mL Mouth Rinse 10 times per day  ° melatonin  5 mg Oral QHS  ° [START ON 03/16/2021] pantoprazole  40 mg Intravenous Q12H  ° polyethylene glycol  17 g Per Tube Daily  ° tobramycin  1 drop Left Eye BID  ° °Continuous Infusions: ° sodium chloride    ° sodium chloride    ° sodium chloride 100 mL/hr at 03/14/21 0700  ° pantoprazole 8 mg/hr (03/14/21 0700)  ° phenylephrine (NEO-SYNEPHRINE) Adult infusion 200 mcg/min (03/14/21 0901)  ° propofol (DIPRIVAN) infusion 10 mcg/kg/min (03/14/21 0700)  ° °PRN Meds:.acetaminophen **OR** acetaminophen, fentaNYL (SUBLIMAZE) injection, fentaNYL (SUBLIMAZE) injection, hydrALAZINE, ondansetron **OR** ondansetron (ZOFRAN) IV ° ° °ASSESMENT:  ° °Hematemesis/CGE, dark/FOBT + stools.  Blood loss anemia requiring transfusion. °Prior severe esophagitis, large HH at EGD 06/2020.  Candida esophagitis, mild gastritis, large HH at 09/2020 EGD.   °03/16/2021 EGD: Retained fluid in mid and distal esophagus requiring suction.  Medium sized HH.  Distal esophagitis with friability, mild oozing on contact.  Retained gastric debris/fluid, 3 L suctioned but views of stomach still limited.  Duodenal bulb normal.  NGT then placed with immediate suctioning of 500 mL °Protonix gtt in place.  Takin Protonix 40 mg daily PTA.   ° °Anemia.  Hgb 9.4.  1 PRBC since transfer to [facility] from [facility].  Daily po iron PTA.   ° °AKI.  Baseline ckd 3.   ° °  ? Parkinsons.   Simemet on home med list.   ° °  S/p intubation, vent support for airway protection.  Increased atx vs infiltrates at bases ber CXR.  Per CCM NP pt not likely to extubate soon.   ° ° °PLAN  ° °Continue   IV Protonix as long as NG tube in place.  Once NG tube removed and able to take oral meds, begin sucralfate 1 g qid.   ° °Repeat EGD 12/28 vs 12/29.  Give dose of Reglan 10 mg iv prior to procedure to assist with clearing of gastric contents.  ° ° °Sarah Gribbin  03/14/2021, 9:21 AM °Phone [phone]  ° ° ° ° Attending Physician Note  ° °I have taken an interval history, reviewed the chart and examined the patient. I personally saw the patient and performed a substantive portion of this encounter, including a complete performance of at least one of the key components, in conjunction with the APP. I agree with the APP's note, impression and recommendations.  ° °*Acute, recurrent UGI bleed. Limited visualization at EGD yesterday however friable esophagitis and a medium sized HH were noted. Esophagitis noted on a complete EGD in 09/2020. Pt remains intubated today. Continue IV pantoprazole. Start Carafate 1g qid after NGT is removed. Tentatively plan for a repeat EGD tomorrow if patient is stable.  ° °*ABL anemia. Trend CBC. ° °Lonney Revak, MD FACG °See AMION, [facility], for our on call provider  °

## 2021-03-14 NOTE — Progress Notes (Signed)
Initial Nutrition Assessment  DOCUMENTATION CODES:   Severe malnutrition in context of chronic illness  INTERVENTION:   If unable to extubate and advance diet, start TF via NG Initiate Vital AF 1.2 @ 20 mL/hr and advance by 10 mL q8h until goal of 60 mL/hr is met (1440 mL/day) Provides 1728 kcal, 108 gm PRO, and 1168 mL free water daily.  NUTRITION DIAGNOSIS:   Severe Malnutrition related to chronic illness as evidenced by severe fat depletion, severe muscle depletion.  GOAL:   Patient will meet greater than or equal to 90% of their needs  MONITOR:   Vent status, Labs, Skin  REASON FOR ASSESSMENT:   Ventilator    ASSESSMENT:   87 y.o. male presented to the ED with ongoing GI bleed, black stools, and coffee ground emesis. PMH includes prostate cancer, CKD III, CABG, HTN, GERD, and CAD. Pt admitted with hematemesis and AKI on CKD.   12/26 - EGD; Intubated; NGT - LIWS  No family at bedside to provide nutrition related history.  Per EMR, pt had gained weight.   Patient is currently intubated on ventilator support MV: 10.8 L/min MAP: 76  Temp (24hrs), Avg:99.1 F (37.3 C), Min:97.9 F (36.6 C), Max:101.1 F (38.4 C) Continuous Medications: - Propofol: 4.1 ml/hr (108 kcal/day) - Norepinephrine  - Phenylephrine   NGT to LIWS - no output  Per GI note, plan to repeat EGD on 12/28 or 12/29.  Medications reviewed and include: Colace, Protonix, Miralax, IV Protonix Labs reviewed:  - BUN 49 - Creatinine 3.34  NUTRITION - FOCUSED PHYSICAL EXAM:  Flowsheet Row Most Recent Value  Orbital Region Severe depletion  Upper Arm Region Moderate depletion  Thoracic and Lumbar Region Moderate depletion  Buccal Region Unable to assess  Temple Region Severe depletion  Clavicle Bone Region Severe depletion  Clavicle and Acromion Bone Region Severe depletion  Scapular Bone Region Severe depletion  Dorsal Hand Unable to assess  Patellar Region Severe depletion  Anterior  Thigh Region Severe depletion  Posterior Calf Region Severe depletion  Edema (RD Assessment) None  Hair Reviewed  Eyes Unable to assess  Mouth Unable to assess  Skin Reviewed  Nails Reviewed       Diet Order:   Diet Order             Diet NPO time specified  Diet effective now                   EDUCATION NEEDS:   Not appropriate for education at this time  Skin:  Skin Assessment: Skin Integrity Issues: Skin Integrity Issues:: Stage I Stage I: Heel & Buttocks  Last BM:  No Documentation  Height:   Ht Readings from Last 1 Encounters:  03/14/2021 5' 6"  (1.676 m)    Weight:   Wt Readings from Last 1 Encounters:  03/14/21 69.3 kg    Ideal Body Weight:  64.6 kg  BMI:  Body mass index is 24.66 kg/m.  Estimated Nutritional Needs:   Kcal:  1700-1900  Protein:  85-100 grams  Fluid:  >/= 1.7 L    Curtina Grills Louie Casa, RD, LDN Clinical Dietitian See [facility] for contact information.

## 2021-03-14 NOTE — Progress Notes (Signed)
Pharmacy Antibiotic Note  Jon Reid is a 87 y.o. male admitted on 02/24/2021 with  aspiration PNA .  Pharmacy has been consulted for Unasyn dosing.  Plan: Unasyn 3g IV q 12 hrs. F/u cultures, renal function, and clinical course.  Height: 5\' 6"  (167.6 cm) Weight: 69.3 kg (152 lb 12.5 oz) IBW/kg (Calculated) : 63.8  Temp (24hrs), Avg:98.7 F (37.1 C), Min:97.9 F (36.6 C), Max:101.1 F (38.4 C)  Recent Labs  Lab 03/05/2021 0154 02/18/2021 1039 03/05/2021 1714 02/25/2021 2146 03/14/21 0421  WBC 12.7* 10.5 14.0* 9.6 6.3  CREATININE 2.08*  --   --  3.37* 3.34*  LATICACIDVEN  --   --   --  5.1* 2.9*    Estimated Creatinine Clearance: 13.3 mL/min (A) (by C-G formula based on SCr of 3.34 mg/dL (H)).    Allergies  Allergen Reactions   Crestor [Rosuvastatin] Swelling    GYNECOMASTIA    Trazodone And Nefazodone     Hallucinations    Antimicrobials this admission: Unasyn 12/27 >   Dose adjustments this admission:  Microbiology results: 12/27 Resp Cx >  12/27 BCx x 2 >   Thank you for allowing pharmacy to be a part of this patients care.  [place] Crane, Roylene Reason, [facility] Clinical Pharmacist  03/14/2021 10:54 AM   [facility] pharmacy phone numbers are listed on [email or web address]

## 2021-03-14 NOTE — Progress Notes (Signed)
NAME:  Jon Reid, MRN:  [id number], DOB:  09-13-1928, LOS: 1 ADMISSION DATE:  03/18/2021, CONSULTATION DATE:  02/20/2021 REFERRING MD:  Carmelina Peal, CHIEF COMPLAINT:   GI bleed   History of Present Illness:  Jon Reid is a 87 y.o. male w a past medical history significant for CKD stage III, hypertension, moderate AAS, CAD status post CABG, OSA, and prostate cancer who presented to the emergency department with recurrent hematemesis.  Patient has history of upper GI bleed with acute blood loss anemia and grade D esophagitis on EGD June 2022.  Of note patient was admitted 11/23 through 11/26 for recurrent bleeding underwent EGD with no active signs of bleeding.  Patient reports hematemesis began 11/25  On ED arrival patient was seen hemodynamically stable with mild tachypnea lab work significant for potassium 3.2, glucose 160, creatinine 2.80 up from 1.41, WBC 12.7, and hemoglobin 10.8 with subsequent drop to 9.3 while in the ED. GI consult and patient initially patient refused EGD but later began having more frequent hematic emesis prompting repeat and engagement of GI.  PCCM also consulted for further assistance  Pertinent  Medical History  CKD stage III, hypertension, moderate AAS, CAD status post CABG, OSA, and prostate cancer  Significant Hospital Events: Including procedures, antibiotic start and stop dates in addition to other pertinent events   12/26 presented with hematic emesis and drop in hemoglobin, hypotensive on Neo, vomited, intubated, EGD with diffuse gastritis/ 3L suctioned out, transfused 1unit PRBC  Interim History / Subjective:  Remains on Neo at 180 mcg/min Hgb stable around 9.3 No further NGT output RT reports suctioning of coffee ground looking secretions from ETT overnight Tmax 101.1 No UOP- bladder scan with ~300 overnight Remains on FiO2 60% with sats 89-92%  Objective   Blood pressure (!) 96/57, pulse 86, temperature (!) 101.1 F (38.4 C), temperature  source Axillary, resp. rate 19, height 5\' 6"  (1.676 m), weight 69.3 kg, SpO2 93 %.    Vent Mode: PRVC FiO2 (%):  [60 %-100 %] 60 % Set Rate:  [14 bmp-22 bmp] 14 bmp Vt Set:  [510 mL] 510 mL PEEP:  [5 cmH20] 5 cmH20 Plateau Pressure:  [10 WUJ81-19 cmH20] 20 cmH20   Intake/Output Summary (Last 24 hours) at 03/14/2021 1478 Last data filed at 03/14/2021 0700 Gross per 24 hour  Intake 3916.63 ml  Output --  Net 3916.63 ml   Filed Weights   02/18/2021 0121 02/25/2021 2130 03/14/21 0500  Weight: 69.4 kg 64.8 kg 69.3 kg   Examination: sedated on propofol at 10 General:  Frail elderly male sedated/ intubated on MV HEENT: MM pink/moist, R nare NGT- no output, ETT 25 at lip Neuro: sedated, intermittently f/c CV: RR, no murmur PULM:  non labored on full support MV, diminished, few scattered rhonchi on right, vent pressures wnl- plat 18, dp 13, but remains on 60%/ 5 peep GI: soft, bs+, ND/ NT, primafit Extremities: warm/dry, no LE edema  Skin: no rashes  Labs reviewed: K 3.8, sCr 3.37 > 3.34, lactic 5.1 > 2.9, Hgb 9.6 > 9.4  Resolved Hospital Problem list     Assessment & Plan:  Acute upper GI bleed with hematemesis secondary to severe esophagitis -Seen with multiple episodes coffee-ground emesis in ED - s/p EGD with [facility] 12/26 overnight Acute blood loss anemia Shock- presumed ABLA, however can not rule out septic component -Hemoglobin dropped from 10.5-9.3 while in ED, transfused 1 unit PRBC 12/26 P: - Appreciate GI assistance  - Will likely  need repeat EGD 12/28 or 12/29 and will need reglan prior to EGD - Continue to trend H/H, transfuse for Hgb < 7 or acute bleeding - Continue PPI gtt then transition to BID  - NGT to remain to suction- minimal output - NPO for now, will need to resume sucralfate 1g BID when able to resume POs - Hold ASA  - Lactate is clearing - Continue to wean Neo for MAP goal > 65 - May need central access but hold for now as we are weaning -  Additional LR bolus now  - pan culture and start abx as below  Acute hypoxic respiratory failure Aspiration pneumonia / pneumonitis  Mixed alkalosis  P:  - Full MV support - repeat CXR now -> ETT satisfactory, developing bibasilar patchy infiltrates  - Repeat ABG 7.542/ 41/ 51/ 35 -> drop TV to 7cc/kg and RR 14 > 10 - Repeat ABG at 11am - goal Pplat <30 and DP<15  - VAP prevention protocol/ PPI - PAD protocol for sedation> propofol (low dose) and prn fentanyl  - wean FiO2 as able for SpO2 >92%  - daily SAT & SBT- does not meet criteria today  - send trach asp and start unasyn (MRSA pcr negative)  Acute kidney injury superimposed on CKD stage 3a Metabolic alkaosis  -Creatinine 1.41 with GFR 4710/11/08,  - creatinine 2.80 with GFR 30 on admit > now 3.34 P: - Bladder scan overnight with~300, now 357 - Will place foley and send for UA - Hemodynamic support as above - Strict I/O, serial renal indices.  If increasing, will need renal US - Avoid nephrotoxins   Hypokalemia P: - K 3.8 - Check mag - Trend BMET   History of CAD status post CABG History of grade 2 diastolic dysfunction History of hypertension -Home medication includes aspirin, Lasix P: - Hold aspirin and Lasix for now - close monitoring of volume status  - holding antihypertensives in the setting of hypotension  Best Practice (right click and "Reselect all SmartList Selections" daily)   Diet/type: NPO DVT prophylaxis: SCD GI prophylaxis: PPI Lines: N/A, R radial Aline (12/26) Foley:  N/A, placing 12/27 Code Status:  DNR Last date of multidisciplinary goals of care discussion: Pending   Labs   CBC: Recent Labs  Lab 03/12/2021 0154 03/17/2021 1039 02/26/2021 1714 03/01/2021 1930 03/10/2021 2146 02/23/2021 2147 03/14/21 0138 03/14/21 0310 03/14/21 0421  WBC 12.7* 10.5 14.0*  --  9.6  --   --   --  6.3  NEUTROABS  --   --   --   --  8.3*  --   --   --   --   HGB 10.8* 10.2* 9.3*   < > 9.6* 9.5* 9.9* 9.5*  9.4*  HCT 34.3* 31.7* 28.9*   < > 29.3* 28.0* 29.0* 28.0* 28.3*  MCV 99.7 97.2 98.0  --  95.1  --   --   --  95.6  PLT 338 320 327  --  264  --   --   --  262   < > = values in this interval not displayed.    Basic Metabolic Panel: Recent Labs  Lab 03/17/2021 0154 03/14/2021 1930 03/05/2021 2146 02/19/2021 2147 03/14/21 0138 03/14/21 0310 03/14/21 0421  NA 144   < > 146* 143 143 144 142  K 3.2*   < > 3.6 3.4* 3.7 3.7 3.8  CL 101  --  94*  --   --   --  97*  CO2  28  --  35*  --   --   --  32  GLUCOSE 160*  --  146*  --   --   --  149*  BUN 24*  --  40*  --   --   --  49*  CREATININE 2.08*  --  3.37*  --   --   --  3.34*  CALCIUM 9.3  --  8.3*  --   --   --  7.6*   < > = values in this interval not displayed.   GFR: Estimated Creatinine Clearance: 13.3 mL/min (A) (by C-G formula based on SCr of 3.34 mg/dL (H)). Recent Labs  Lab 02/23/2021 1039 02/23/2021 1714 03/01/2021 2146 03/14/21 0421  WBC 10.5 14.0* 9.6 6.3  LATICACIDVEN  --   --  5.1* 2.9*    Liver Function Tests: Recent Labs  Lab 03/01/2021 0154  AST 30  ALT 15  ALKPHOS 94  BILITOT 0.7  PROT 7.6  ALBUMIN 4.1   No results for input(s): LIPASE, AMYLASE in the last 168 hours. No results for input(s): AMMONIA in the last 168 hours.  ABG    Component Value Date/Time   PHART 7.574 (H) 03/14/2021 0310   PCO2ART 42.2 03/14/2021 0310   PO2ART 78 (L) 03/14/2021 0310   HCO3 39.1 (H) 03/14/2021 0310   TCO2 40 (H) 03/14/2021 0310   ACIDBASEDEF 4.0 (H) 11/11/2012 1933   O2SAT 97.0 03/14/2021 0310     Coagulation Profile: Recent Labs  Lab 02/24/2021 1714  INR 1.1    Cardiac Enzymes: No results for input(s): CKTOTAL, CKMB, CKMBINDEX, TROPONINI in the last 168 hours.  HbA1C: Hemoglobin A1C  Date/Time Value Ref Range Status  05/31/2014 12:47 PM 5.5%  Final    Comment:    4.8-5.6% normal range  09/03/2013 10:19 AM 5.9  Final    Comment:    4.8-5.6 NORMAL RANGE   Hgb A1c MFr Bld  Date/Time Value Ref Range Status   11/08/2012 05:00 AM 5.5 <5.7 % Final    Comment:    (NOTE)                                                                       According to the ADA Clinical Practice Recommendations for 2011, when HbA1c is used as a screening test:  >=6.5%   Diagnostic of Diabetes Mellitus           (if abnormal result is confirmed) 5.7-6.4%   Increased risk of developing Diabetes Mellitus References:Diagnosis and Classification of Diabetes Mellitus,Diabetes MGQQ,7619,50(DTOIZ 1):S62-S69 and Standards of Medical Care in         Diabetes - 2011,Diabetes Care,2011,34 (Suppl 1):S11-S61.    CBG: Recent Labs  Lab 02/25/2021 2054 02/27/2021 2332 03/14/21 0337 03/14/21 0736  GLUCAP 119* 159* 154* 123*     Critical care time: 35 mins       Kennieth Rad, ACNP [facility] 03/14/2021, 8:08 AM  See Amion for pager If no response to pager, please call PCCM consult pager After 7:00 pm call Elink

## 2021-03-14 NOTE — Care Management (Signed)
°  Transition of Care [facility]) Screening Note   Patient Details  Name: Jon Reid Date of Birth: 03-19-1929   Transition of Care [facility]) CM/SW Contact:    Carles Collet, RN Phone Number: 03/14/2021, 2:32 PM    Transition of Care Department [facility]) has reviewed patient and no TOC needs have been identified at this time. We will continue to monitor patient advancement through interdisciplinary progression rounds. If new patient transition needs arise, please place a TOC consult.

## 2021-03-14 NOTE — Progress Notes (Addendum)
Daily Rounding Note  03/14/2021, 9:21 AM  LOS: 1 day   SUBJECTIVE:   Chief complaint:  CGE, melena, HH, esophagitis, retained esoph and gastric fluid.    Remains on vent on Precedex and phenylephrine.  No recorded output from NGT but has about 2 to 30 mL in canister currently.  No stools reported.     OBJECTIVE:         Vital signs in last 24 hours:    Temp:  [97.9 F (36.6 C)-101.1 F (38.4 C)] 101.1 F (38.4 C) (12/27 0740) Pulse Rate:  [30-110] 85 (12/27 0800) Resp:  [14-31] 19 (12/27 0714) BP: (74-163)/(35-75) 102/55 (12/27 0800) SpO2:  [81 %-100 %] 90 % (12/27 0800) FiO2 (%):  [60 %-100 %] 60 % (12/27 0714) Weight:  [64.8 kg-69.3 kg] 69.3 kg (12/27 0500)   Filed Weights   03/14/2021 0121 03/05/2021 2130 03/14/21 0500  Weight: 69.4 kg 64.8 kg 69.3 kg   General: sedated on vent, no response to exam   Heart: RRR w murmer Chest: even, non-labored breathing on vent Abdomen: soft, thin, NT.  BS normal but scant/hypoactive.  NGT canister w small volume espresso--colored liquid   Extremities: feet warm, no LE edema.  Purpura, dusky discoloration on forearms Neuro/Psych:  unresponsive, sedated.    Intake/Output from previous day: 12/26 0701 - 12/27 0700 In: 3916.6 [I.V.:2951.6; Blood:315; IV Piggyback:650] Out: -   Intake/Output this shift: No intake/output data recorded.  Lab Results: Recent Labs    03/16/2021 1714 03/14/2021 1930 03/02/2021 2146 03/18/2021 2147 03/14/21 0138 03/14/21 0310 03/14/21 0421  WBC 14.0*  --  9.6  --   --   --  6.3  HGB 9.3*   < > 9.6*   < > 9.9* 9.5* 9.4*  HCT 28.9*   < > 29.3*   < > 29.0* 28.0* 28.3*  PLT 327  --  264  --   --   --  262   < > = values in this interval not displayed.   BMET Recent Labs    02/26/2021 0154 02/25/2021 1930 03/06/2021 2146 02/19/2021 2147 03/14/21 0138 03/14/21 0310 03/14/21 0421  NA 144   < > 146*   < > 143 144 142  K 3.2*   < > 3.6   < > 3.7 3.7 3.8   CL 101  --  94*  --   --   --  97*  CO2 28  --  35*  --   --   --  32  GLUCOSE 160*  --  146*  --   --   --  149*  BUN 24*  --  40*  --   --   --  49*  CREATININE 2.08*  --  3.37*  --   --   --  3.34*  CALCIUM 9.3  --  8.3*  --   --   --  7.6*   < > = values in this interval not displayed.   LFT Recent Labs    02/16/2021 0154  PROT 7.6  ALBUMIN 4.1  AST 30  ALT 15  ALKPHOS 94  BILITOT 0.7   PT/INR Recent Labs    03/07/2021 1714  LABPROT 14.4  INR 1.1    Studies/Results: DG Chest Port 1 View  Result Date: 03/14/2021 CLINICAL DATA:  Hypoxia, emesis, intubated EXAM: PORTABLE CHEST 1 VIEW COMPARISON:  Chest radiograph 1 day prior FINDINGS: The endotracheal tube tip is approximately  3.9 cm from the carina. The enteric catheter is coiled in the stomach with the tip off the field of view. Median sternotomy wires are stable. The cardiomediastinal silhouette is stable. There is patchy and confluent airspace disease in both lung bases extending to the left mid lung. Overall, aeration has worsened since the study from 1 day prior. There are small bilateral pleural effusions, increased on the right. There is no pneumothorax. The bones are stable. IMPRESSION: 1. Endotracheal tube approximally 3.9 cm from the carina. 2. Patchy and confluent airspace disease in both lower lobes and left midlung, overall worsened since the study from 1 day prior. 3. Small bilateral pleural effusions, increased on the right. Electronically Signed   By: Valetta Mole M.D.   On: 03/14/2021 08:41   DG Chest Port 1 View  Result Date: 03/12/2021 CLINICAL DATA:  Endotracheal tube placement EXAM: PORTABLE CHEST 1 VIEW COMPARISON:  02/23/2021 FINDINGS: An endotracheal tube has been placed with tip measuring 6.6 cm above the carina. An enteric tube has been placed with tip in the left upper quadrant consistent with location in the body of the stomach. Heart size is normal. Infiltration or atelectasis is increasing in the lung  bases, particularly on the right. Hiatal hernia behind the heart is decreased. Calcification of the aorta. IMPRESSION: Appliances are in satisfactory position. Increasing atelectasis or infiltration in the lung bases. Electronically Signed   By: Lucienne Capers M.D.   On: 03/10/2021 22:07    Scheduled Meds:  sodium chloride   Intravenous Once   carbidopa-levodopa  1 tablet Oral BID   chlorhexidine gluconate (MEDLINE KIT)  15 mL Mouth Rinse BID   Chlorhexidine Gluconate Cloth  6 each Topical Daily   docusate  100 mg Per Tube BID   mouth rinse  15 mL Mouth Rinse 10 times per day   melatonin  5 mg Oral QHS   [START ON 03/16/2021] pantoprazole  40 mg Intravenous Q12H   polyethylene glycol  17 g Per Tube Daily   tobramycin  1 drop Left Eye BID   Continuous Infusions:  sodium chloride     sodium chloride     sodium chloride 100 mL/hr at 03/14/21 0700   pantoprazole 8 mg/hr (03/14/21 0700)   phenylephrine (NEO-SYNEPHRINE) Adult infusion 200 mcg/min (03/14/21 0901)   propofol (DIPRIVAN) infusion 10 mcg/kg/min (03/14/21 0700)   PRN Meds:.acetaminophen **OR** acetaminophen, fentaNYL (SUBLIMAZE) injection, fentaNYL (SUBLIMAZE) injection, hydrALAZINE, ondansetron **OR** ondansetron (ZOFRAN) IV   ASSESMENT:   Hematemesis/CGE, dark/FOBT + stools.  Blood loss anemia requiring transfusion. Prior severe esophagitis, large HH at EGD 06/2020.  Candida esophagitis, mild gastritis, large HH at 09/2020 EGD.   03/17/2021 EGD: Retained fluid in mid and distal esophagus requiring suction.  Medium sized HH.  Distal esophagitis with friability, mild oozing on contact.  Retained gastric debris/fluid, 3 L suctioned but views of stomach still limited.  Duodenal bulb normal.  NGT then placed with immediate suctioning of 500 mL Protonix gtt in place.  Takin Protonix 40 mg daily PTA.    Anemia.  Hgb 9.4.  1 PRBC since transfer to [facility] from [facility].  Daily po iron PTA.    AKI.  Baseline ckd 3.      ? Parkinsons.   Simemet on home med list.      S/p intubation, vent support for airway protection.  Increased atx vs infiltrates at bases ber CXR.  Per CCM NP pt not likely to extubate soon.     PLAN   Continue  IV Protonix as long as NG tube in place.  Once NG tube removed and able to take oral meds, begin sucralfate 1 g qid.    Repeat EGD 12/28 vs 12/29.  Give dose of Reglan 10 mg iv prior to procedure to assist with clearing of gastric contents.    Azucena Freed  03/14/2021, 9:21 AM Phone [phone]      Attending Physician Note   I have taken an interval history, reviewed the chart and examined the patient. I personally saw the patient and performed a substantive portion of this encounter, including a complete performance of at least one of the key components, in conjunction with the APP. I agree with the APP's note, impression and recommendations.   *Acute, recurrent UGI bleed. Limited visualization at EGD yesterday however friable esophagitis and a medium sized HH were noted. Esophagitis noted on a complete EGD in 09/2020. Pt remains intubated today. Continue IV pantoprazole. Start Carafate 1g qid after NGT is removed. Tentatively plan for a repeat EGD tomorrow if patient is stable.   *ABL anemia. Trend CBC.  Lucio Edward, MD [facility] See AMION, [facility], for our on call provider

## 2021-03-14 NOTE — Progress Notes (Signed)
Advanced ETT tube from 23 cm to 25 cm per order. Vent settings changed by NP to 510-14-60% + ABG at 02:00

## 2021-03-15 ENCOUNTER — Telehealth: Payer: Self-pay | Admitting: Nurse Practitioner

## 2021-03-15 ENCOUNTER — Inpatient Hospital Stay (HOSPITAL_COMMUNITY): Payer: MEDICARE

## 2021-03-15 ENCOUNTER — Encounter (HOSPITAL_COMMUNITY): Payer: Self-pay | Admitting: Pulmonary Disease

## 2021-03-15 ENCOUNTER — Encounter (HOSPITAL_COMMUNITY): Admission: EM | Disposition: E | Payer: Self-pay | Source: Home / Self Care | Attending: Internal Medicine

## 2021-03-15 DIAGNOSIS — K2289 Other specified disease of esophagus: Secondary | ICD-10-CM

## 2021-03-15 DIAGNOSIS — Z9289 Personal history of other medical treatment: Secondary | ICD-10-CM

## 2021-03-15 DIAGNOSIS — K921 Melena: Secondary | ICD-10-CM | POA: Diagnosis not present

## 2021-03-15 DIAGNOSIS — K920 Hematemesis: Secondary | ICD-10-CM | POA: Diagnosis not present

## 2021-03-15 HISTORY — PX: ESOPHAGOGASTRODUODENOSCOPY (EGD) WITH PROPOFOL: SHX5813

## 2021-03-15 HISTORY — PX: HOT HEMOSTASIS: SHX5433

## 2021-03-15 LAB — GLUCOSE, CAPILLARY
Glucose-Capillary: 101 mg/dL — ABNORMAL HIGH (ref 70–99)
Glucose-Capillary: 105 mg/dL — ABNORMAL HIGH (ref 70–99)
Glucose-Capillary: 106 mg/dL — ABNORMAL HIGH (ref 70–99)
Glucose-Capillary: 109 mg/dL — ABNORMAL HIGH (ref 70–99)
Glucose-Capillary: 113 mg/dL — ABNORMAL HIGH (ref 70–99)
Glucose-Capillary: 131 mg/dL — ABNORMAL HIGH (ref 70–99)
Glucose-Capillary: 98 mg/dL (ref 70–99)

## 2021-03-15 LAB — CBC
HCT: 26.6 % — ABNORMAL LOW (ref 39.0–52.0)
Hemoglobin: 8.5 g/dL — ABNORMAL LOW (ref 13.0–17.0)
MCH: 31.1 pg (ref 26.0–34.0)
MCHC: 32 g/dL (ref 30.0–36.0)
MCV: 97.4 fL (ref 80.0–100.0)
Platelets: 152 10*3/uL (ref 150–400)
RBC: 2.73 MIL/uL — ABNORMAL LOW (ref 4.22–5.81)
RDW: 18.8 % — ABNORMAL HIGH (ref 11.5–15.5)
WBC: 7.2 10*3/uL (ref 4.0–10.5)
nRBC: 0 % (ref 0.0–0.2)

## 2021-03-15 LAB — POCT I-STAT 7, (LYTES, BLD GAS, ICA,H+H)
Acid-Base Excess: 7 mmol/L — ABNORMAL HIGH (ref 0.0–2.0)
Bicarbonate: 30.8 mmol/L — ABNORMAL HIGH (ref 20.0–28.0)
Calcium, Ion: 0.96 mmol/L — ABNORMAL LOW (ref 1.15–1.40)
HCT: 26 % — ABNORMAL LOW (ref 39.0–52.0)
Hemoglobin: 8.8 g/dL — ABNORMAL LOW (ref 13.0–17.0)
O2 Saturation: 96 %
Patient temperature: 98.7
Potassium: 3.3 mmol/L — ABNORMAL LOW (ref 3.5–5.1)
Sodium: 145 mmol/L (ref 135–145)
TCO2: 32 mmol/L (ref 22–32)
pCO2 arterial: 39.5 mmHg (ref 32.0–48.0)
pH, Arterial: 7.5 — ABNORMAL HIGH (ref 7.350–7.450)
pO2, Arterial: 77 mmHg — ABNORMAL LOW (ref 83.0–108.0)

## 2021-03-15 LAB — HEMOGLOBIN AND HEMATOCRIT, BLOOD
HCT: 24.4 % — ABNORMAL LOW (ref 39.0–52.0)
Hemoglobin: 8.1 g/dL — ABNORMAL LOW (ref 13.0–17.0)

## 2021-03-15 LAB — RENAL FUNCTION PANEL
Albumin: 2.3 g/dL — ABNORMAL LOW (ref 3.5–5.0)
Anion gap: 13 (ref 5–15)
BUN: 49 mg/dL — ABNORMAL HIGH (ref 8–23)
CO2: 29 mmol/L (ref 22–32)
Calcium: 7.3 mg/dL — ABNORMAL LOW (ref 8.9–10.3)
Chloride: 101 mmol/L (ref 98–111)
Creatinine, Ser: 2.97 mg/dL — ABNORMAL HIGH (ref 0.61–1.24)
GFR, Estimated: 19 mL/min — ABNORMAL LOW (ref >60)
Glucose, Bld: 107 mg/dL — ABNORMAL HIGH (ref 70–99)
Phosphorus: 4.4 mg/dL (ref 2.5–4.6)
Potassium: 3.2 mmol/L — ABNORMAL LOW (ref 3.5–5.1)
Sodium: 143 mmol/L (ref 135–145)

## 2021-03-15 LAB — MAGNESIUM: Magnesium: 1.8 mg/dL (ref 1.7–2.4)

## 2021-03-15 SURGERY — ESOPHAGOGASTRODUODENOSCOPY (EGD) WITH PROPOFOL

## 2021-03-15 MED ORDER — FENTANYL CITRATE (PF) 100 MCG/2ML IJ SOLN
INTRAMUSCULAR | Status: AC
  Filled 2021-03-15: qty 2

## 2021-03-15 MED ORDER — VITAL HIGH PROTEIN PO LIQD: 1000.0000 mL | ORAL | Status: DC

## 2021-03-15 MED ORDER — MIDAZOLAM HCL (PF) 5 MG/ML IJ SOLN
INTRAMUSCULAR | Status: AC
  Filled 2021-03-15: qty 1

## 2021-03-15 MED ORDER — CHLORHEXIDINE GLUCONATE 0.12 % MT SOLN
OROMUCOSAL | Status: AC
  Administered 2021-03-15: 20:00:00 15 mL via OROMUCOSAL
  Filled 2021-03-15: qty 15

## 2021-03-15 MED ORDER — POTASSIUM CHLORIDE 20 MEQ PO PACK
40.0000 meq | PACK | Freq: Once | ORAL | Status: AC
  Administered 2021-03-15: 06:00:00 40 meq
  Filled 2021-03-15: qty 2

## 2021-03-15 MED ORDER — VITAL AF 1.2 CAL PO LIQD
1000.0000 mL | ORAL | Status: DC
  Administered 2021-03-15: 17:00:00 1000 mL

## 2021-03-15 MED ORDER — CALCIUM GLUCONATE-NACL 2-0.675 GM/100ML-% IV SOLN
2.0000 g | Freq: Once | INTRAVENOUS | Status: AC
  Administered 2021-03-15: 07:00:00 2000 mg via INTRAVENOUS
  Filled 2021-03-15: qty 100

## 2021-03-15 MED ORDER — SODIUM CHLORIDE 0.9% FLUSH
10.0000 mL | Freq: Two times a day (BID) | INTRAVENOUS | Status: DC
  Administered 2021-03-15 – 2021-03-19 (×8): 10 mL

## 2021-03-15 MED ORDER — SODIUM CHLORIDE 0.9 % IV SOLN
2.0000 g | INTRAVENOUS | Status: DC
  Administered 2021-03-15 – 2021-03-18 (×4): 2 g via INTRAVENOUS
  Filled 2021-03-15 (×4): qty 2

## 2021-03-15 MED ORDER — SODIUM CHLORIDE 0.9% FLUSH: 10.0000 mL | INTRAVENOUS | Status: DC | PRN

## 2021-03-15 MED ORDER — MAGNESIUM SULFATE IN D5W 1-5 GM/100ML-% IV SOLN
1.0000 g | Freq: Once | INTRAVENOUS | Status: AC
Start: 2021-03-15 — End: 2021-03-15
  Administered 2021-03-15: 07:00:00 1 g via INTRAVENOUS
  Filled 2021-03-15: qty 100

## 2021-03-15 SURGICAL SUPPLY — 15 items

## 2021-03-15 NOTE — Progress Notes (Signed)
NAME:  Jon Reid, MRN:  [id number], DOB:  1928-03-27, LOS: 2 ADMISSION DATE:  02/20/2021, CONSULTATION DATE:  03/14/2021 REFERRING MD:  Carmelina Peal, CHIEF COMPLAINT:   GI bleed   History of Present Illness:  Jon Reid is a 87 y.o. male w a past medical history significant for CKD stage III, hypertension, moderate AAS, CAD status post CABG, OSA, and prostate cancer who presented to the emergency department with recurrent hematemesis.  Patient has history of upper GI bleed with acute blood loss anemia and grade D esophagitis on EGD June 2022.  Of note patient was admitted 11/23 through 11/26 for recurrent bleeding underwent EGD with no active signs of bleeding.  Patient reports hematemesis began 11/25  On ED arrival patient was seen hemodynamically stable with mild tachypnea lab work significant for potassium 3.2, glucose 160, creatinine 2.80 up from 1.41, WBC 12.7, and hemoglobin 10.8 with subsequent drop to 9.3 while in the ED. GI consult and patient initially patient refused EGD but later began having more frequent hematic emesis prompting repeat and engagement of GI.  PCCM also consulted for further assistance  Pertinent  Medical History  CKD stage III, hypertension, moderate AAS, CAD status post CABG, OSA, and prostate cancer  Significant Hospital Events: Including procedures, antibiotic start and stop dates in addition to other pertinent events   12/26 presented with hematic emesis and drop in hemoglobin, hypotensive on Neo, vomited, intubated, EGD with diffuse gastritis/ 3L suctioned out, transfused 1unit PRBC 12/27 worsening O2 requirements on vent, coffee ground secretions from ETTk, started on unasyn for aspiration, increasing vasopressor requirements despite fluids, L IJ CVL placed, foley placed for retention/ worsening sCr  Interim History / Subjective:  Remains on NE 14 mcg CVP 9 No events overnight Afebrile  Repeat EGD this am at bedside Trach asp > enterobacter  aerogenes  Objective   Blood pressure (!) 119/59, pulse 88, temperature 98.6 F (37 C), resp. rate (!) 24, height 5\' 6"  (1.676 m), weight 69.3 kg, SpO2 92 %. CVP:  [5 mmHg-12 mmHg] 9 mmHg  Vent Mode: PRVC FiO2 (%):  [55 %-65 %] 65 % Set Rate:  [10 bmp] 10 bmp Vt Set:  [0.45 mL-450 mL] 450 mL PEEP:  [8 cmH20] 8 cmH20 Plateau Pressure:  [13 cmH20-19 cmH20] 19 cmH20   Intake/Output Summary (Last 24 hours) at 02/27/2021 1154 Last data filed at 03/11/2021 1100 Gross per 24 hour  Intake 2404.6 ml  Output 1825 ml  Net 579.6 ml   Filed Weights   02/21/2021 0121 03/03/2021 2130 03/14/21 0500  Weight: 69.4 kg 64.8 kg 69.3 kg   Examination:  General:  Frail and ill appearing elderly male sedated on propofol 10.  Sedation held for assessment HEENT: MM pink/dry, dried coffee grounds, ETT, R NGT, pupils 3/reactive, +JVD Neuro:  will open eyes and follow simple commands in all extremities CV: RR IR, no murmur PULM:  non labored on full support, clear and diminished, vent pressures remain wnl, starting to attempt to wean FiO2 at 65%, ongoing dark brown secretions GI: soft, bs+, ND, foley- cyu Extremities: warm/dry, no LE edema  Skin: no rashes, posterior sacral pressure wound not visualized  Afebrile  UOP 1.5L/ 24 hours Net +4.8L  Labs reviewed: K 3.2, sCr 3.37 > 3.34 > 2.97, Hgb 9.6 > 9.4 > 8.5, mag 1.8, iCa 0.96  Micro:  12/26 SARS/ flu > neg 12/26 MRSA PCR > neg 12/27 Bcx2 >  12/27 trach asp > GNR > enterobacter aerogenes >susceptibilities pending  12/27 unasyn > 12/28 12/28 cefepime >   12/28 EGD >  - LA Grade A reflux esophagitis with no bleeding. - Mallory-Weiss tear, oozing. Treated with argon plasma coagulation (APC). - Medium-sized hiatal hernia. - A medium amount of food (residue) in the stomach. - Oozing duodenal ulcer with oozing hemorrhage (Forrest Class Ib). Treated with argon plasma coagulation (APC). - Duodenal erosions without bleeding. - No specimens  collected.  Resolved Hospital Problem list     Assessment & Plan:  Acute upper GI bleed with hematemesis secondary to severe esophagitis, mallory-weiss tear, duodenal ulcer  -Seen with multiple episodes coffee-ground emesis in ED - s/p EGD with [facility] 12/26 overnight P:  - repeat EGD at bedside today, found oozing mallory-weiss tear s/p APC, medium amount of residual food in stomach, oozing duodenal ulcer s/p APC, duodenal erosions without bleeding - continue PPI BID (monitor Qtc) - NPO, ok to start TF, will start TF.  ? If need prokinetic for gastric emptying vs further study - GI to check H pylori AB - no NSAIDS, including ASA    Acute blood loss anemia -Hemoglobin dropped from 10.5-9.3 while in ED, transfused 1 unit PRBC 12/26 P:  - H/H 9.5 >  8.8, will check H/H this evening to ensure no further drop - transfuse for Hgb < 7  Shock- presumed ABLA initially, now more consistent with septic shock secondary to enterobacter pneumonia  P: - continue NE for map goal > 65 - trend CVP - abx as below - follow cultures / susceptibilities  - monitor H/H to ensure no further component of ABLA - strict I/Os  Acute hypoxic respiratory failure Enterobacter PNA  Mixed alkalosis, improving P:  - continue full MV support, PRVC, 7cc/kg, rate 10 - intermittent CXR - VAP / PPI  - PAD protocol > propofol (off and tolerating) and prn fentanyl, RASS goal 0/-1 - wean FiO2/ PEEP as able for SpO2 >92%  - daily SAT & SBT- does not meet criteria today  - changing from unasyn to cefepime given trach culture - ongoing aspiration risk given delayed gastric emptying   Acute kidney injury superimposed on CKD stage 3a Metabolic alkaosis, improving - Creatinine 1.41 with GFR 4710/11/08  - likely in the setting of hypotension and possible obstructive component with retention s/p foley 12/27 P: - continue foley for now - improving sCr, UOP remains ok - Hemodynamic support as above - Strict I/O,  serial renal indices.  - Avoid nephrotoxins  Hypokalemia Hypocalcemia P: - s/p KCL, calcium, and mag replete 12/28 - trend on BMET  History of CAD status post CABG History of grade 2 diastolic dysfunction History of hypertension -Home medication includes aspirin, Lasix P: - holding home ASA/ lasix while in shock.  GI will have to decide on when he could resume ASA given above.   - holding antihypertensives in the setting of hypotension  Prolonged Qtc - tele monitoring - avoid Qtc prolonging meds  Best Practice (right click and "Reselect all SmartList Selections" daily)   Diet/type: NPO; will start TF 12/28 DVT prophylaxis: SCD GI prophylaxis: PPI BID Lines: N/A, R radial Aline (12/26), L IJ CVL 12/27 Foley:  Yes, and it is still needed, placed12/27 Code Status:  DNR Last date of multidisciplinary goals of care discussion: family updated 12/27.  Pending 12/28.  Labs   CBC: Recent Labs  Lab 03/04/2021 1714 03/10/2021 1930 03/16/2021 2146 03/03/2021 2147 03/14/21 0421 03/14/21 0958 03/14/21 1112 03/14/21 1606 02/25/2021 0316 03/12/2021 0457  WBC 14.0*  --  9.6  --  6.3  --   --  3.1* 7.2  --   NEUTROABS  --   --  8.3*  --   --   --   --   --   --   --   HGB 9.3*   < > 9.6*   < > 9.4* 9.5* 9.9* 9.2* 8.5* 8.8*  HCT 28.9*   < > 29.3*   < > 28.3* 28.0* 29.0* 28.5* 26.6* 26.0*  MCV 98.0  --  95.1  --  95.6  --   --  96.6 97.4  --   PLT 327  --  264  --  262  --   --  199 152  --    < > = values in this interval not displayed.    Basic Metabolic Panel: Recent Labs  Lab 03/11/2021 0154 03/16/2021 1930 02/24/2021 2146 03/06/2021 2147 03/14/21 0421 03/14/21 0958 03/14/21 1112 03/10/2021 0316 03/04/2021 0457  NA 144   < > 146*   < > 142 144 144 143 145  K 3.2*   < > 3.6   < > 3.8 3.5 3.4* 3.2* 3.3*  CL 101  --  94*  --  97*  --   --  101  --   CO2 28  --  35*  --  32  --   --  29  --   GLUCOSE 160*  --  146*  --  149*  --   --  107*  --   BUN 24*  --  40*  --  49*  --   --  49*  --    CREATININE 2.08*  --  3.37*  --  3.34*  --   --  2.97*  --   CALCIUM 9.3  --  8.3*  --  7.6*  --   --  7.3*  --   MG  --   --   --   --  2.0  --   --  1.8  --   PHOS  --   --   --   --   --   --   --  4.4  --    < > = values in this interval not displayed.   GFR: Estimated Creatinine Clearance: 14.9 mL/min (A) (by C-G formula based on SCr of 2.97 mg/dL (H)). Recent Labs  Lab 02/27/2021 2146 03/14/21 0421 03/14/21 1014 03/14/21 1257 03/14/21 1606 02/27/2021 0316  WBC 9.6 6.3  --   --  3.1* 7.2  LATICACIDVEN 5.1* 2.9* 2.3* 2.4*  --   --     Liver Function Tests: Recent Labs  Lab 03/18/2021 0154 03/05/2021 0316  AST 30  --   ALT 15  --   ALKPHOS 94  --   BILITOT 0.7  --   PROT 7.6  --   ALBUMIN 4.1 2.3*   No results for input(s): LIPASE, AMYLASE in the last 168 hours. No results for input(s): AMMONIA in the last 168 hours.  ABG    Component Value Date/Time   PHART 7.500 (H) 03/07/2021 0457   PCO2ART 39.5 03/07/2021 0457   PO2ART 77 (L) 02/22/2021 0457   HCO3 30.8 (H) 02/23/2021 0457   TCO2 32 03/03/2021 0457   ACIDBASEDEF 4.0 (H) 11/11/2012 1933   O2SAT 96.0 03/09/2021 0457     Coagulation Profile: Recent Labs  Lab 03/18/2021 1714  INR 1.1    Cardiac Enzymes: No results for input(s): CKTOTAL, CKMB, CKMBINDEX,  TROPONINI in the last 168 hours.  HbA1C: Hemoglobin A1C  Date/Time Value Ref Range Status  05/31/2014 12:47 PM 5.5%  Final    Comment:    4.8-5.6% normal range  09/03/2013 10:19 AM 5.9  Final    Comment:    4.8-5.6 NORMAL RANGE   Hgb A1c MFr Bld  Date/Time Value Ref Range Status  11/08/2012 05:00 AM 5.5 <5.7 % Final    Comment:    (NOTE)                                                                       According to the ADA Clinical Practice Recommendations for 2011, when HbA1c is used as a screening test:  >=6.5%   Diagnostic of Diabetes Mellitus           (if abnormal result is confirmed) 5.7-6.4%   Increased risk of developing Diabetes  [place] References:Diagnosis and Classification of Diabetes Mellitus,Diabetes HOYW,3142,76(RWPTY 1):S62-S69 and Standards of Medical Care in         Diabetes - 2011,Diabetes Care,2011,34 (Suppl 1):S11-S61.    CBG: Recent Labs  Lab 03/14/21 2052 03/03/2021 0047 03/01/2021 0422 02/17/2021 0805 03/12/2021 1108  GLUCAP 109* 106* 105* 101* 98     Critical care time: 35 mins       Kennieth Rad, ACNP [facility] 02/24/2021, 11:54 AM  See Amion for pager If no response to pager, please call PCCM consult pager After 7:00 pm call Elink

## 2021-03-15 NOTE — Progress Notes (Signed)
eLink Physician-Brief Progress Note Patient Name: Jon Reid DOB: 03-13-1929 MRN: [id number]   Date of Service  03/12/2021  HPI/Events of Note  Patient 3 liters fluid positive with iv fluids running at 100 ml / hour, he is oliguric, daytime PCCM attending physician note states that plan is to allow auto-diuresis, patient is on enteral nutrition.  eICU Interventions  Iv fluids discontinued.        Kerry Kass Keilee Denman 03/08/2021, 9:58 PM

## 2021-03-15 NOTE — Interval H&P Note (Signed)
History and Physical Interval Note:  03/01/2021 9:10 AM  Jon Reid  has presented today for surgery, with the diagnosis of gi bleed.  The various methods of treatment have been discussed with the patient and family. After consideration of risks, benefits and other options for treatment, the patient has consented to  Procedure(s): ESOPHAGOGASTRODUODENOSCOPY (EGD) WITH PROPOFOL (N/A) as a surgical intervention.  The patient's history has been reviewed, patient examined, no change in status, stable for surgery.  I have reviewed the patient's chart and labs.  Questions were answered to the patient's satisfaction.     Pricilla Riffle. Fuller Plan

## 2021-03-15 NOTE — Progress Notes (Signed)
eLink Physician-Brief Progress Note Patient Name: Jon Reid DOB: 04/11/1928 MRN: [id number]   Date of Service  03/18/2021  HPI/Events of Note  Multiple issues: 1. Hypokalemia   Hypocalcemia   Hypomagnesemia - K+ = 3.2, ionized Ca++ = 0.96, Mg++ = 1.8 and Creatinine = 2.97. 2. ABG on 65%/PRVC 10/TV 450/P 8 = 7.5/39.5/77/30.8.  eICU Interventions  Plan: Replace K+, Ca++ and Mg++. Continue present ventilator management.      Intervention Category Major Interventions: Respiratory failure - evaluation and management;Electrolyte abnormality - evaluation and management  Jon Reid 03/05/2021, 5:40 AM

## 2021-03-15 NOTE — Op Note (Signed)
[facility] Patient Name: Jon Reid Procedure Date : 03/02/2021 MRN: [id number] Attending MD: Ladene Artist , MD Date of Birth: 01/04/1929 CSN: [id number] Age: 87 Admit Type: Inpatient Procedure:                Upper GI endoscopy Indications:              Hematemesis, Recent gastrointestinal bleeding Providers:                Pricilla Riffle. Fuller Plan, MD, Dulcy Fanny, Lodema Hong Technician, Technician Referring MD:             CCM Medicines:                None. Intubated on propofol infusion. Complications:            No immediate complications. Estimated Blood Loss:     Estimated blood loss was minimal. Procedure:                Pre-Anesthesia Assessment:                           - Prior to the procedure, a History and Physical                            was performed, and patient medications and                            allergies were reviewed. The patient's tolerance of                            previous anesthesia was also reviewed. The risks                            and benefits of the procedure and the sedation                            options and risks were discussed with the patient.                            All questions were answered, and informed consent                            was obtained. Prior Anticoagulants: The patient has                            taken no previous anticoagulant or antiplatelet                            agents. ASA Grade Assessment: IV - A patient with                            severe systemic disease that is a constant threat  to life. After reviewing the risks and benefits,                            the patient was deemed in satisfactory condition to                            undergo the procedure.                           After obtaining informed consent, the endoscope was                            passed under direct vision. Throughout the                             procedure, the patient's blood pressure, pulse, and                            oxygen saturations were monitored continuously. The                            [id number]-[id number] ([id number] Olympus endoscope was introduced                            through the mouth, and advanced to the second part                            of duodenum. The upper GI endoscopy was                            accomplished without difficulty. The patient                            tolerated the procedure well. Scope In: Scope Out: Findings:      LA Grade A (one or more mucosal breaks less than 5 mm, not extending       between tops of 2 mucosal folds) esophagitis with no bleeding was found       in the distal esophagus.      The exam of the esophagus was otherwise normal.      A 8 mm oozing Mallory-Weiss tear with stigmata of recent bleeding was       found. Coagulation for hemostasis using argon plasma was successful.      A medium-sized hiatal hernia was present.      A medium amount of food (residue) was found in the gastric fundus and in       the gastric body. This obscured a significant portion of the greater       curvature, fundus.      The exam of the stomach was otherwise normal.      One oozing superficial duodenal ulcer with oozing hemorrhage (Forrest       Class Ib) was found in the duodenal bulb. The lesion was 10 mm in       largest dimension. Coagulation for hemostasis using argon plasma was       successful.      Two  localized erosions without bleeding were found in the duodenal bulb.      The exam of the duodenum was otherwise normal. Impression:               - LA Grade A reflux esophagitis with no bleeding.                           - Mallory-Weiss tear, oozing. Treated with argon                            plasma coagulation (APC).                           - Medium-sized hiatal hernia.                           - A medium amount of food (residue) in the stomach.                            - Oozing duodenal ulcer with oozing hemorrhage                            (Forrest Class Ib). Treated with argon plasma                            coagulation (APC).                           - Duodenal erosions without bleeding.                           - No specimens collected. Recommendation:           - Patient to remain in ICU for ongoing care.                           - NPO.                           - Continue present medications.                           - Continue PPI IV bid until extubated then PPI po                            bid long term.                           - OK to begin enteral tube feedings.                           - Check serum H pylori Ab.                           - No aspirin, ibuprofen, naproxen, or other  non-steroidal anti-inflammatory drugs. Procedure Code(s):        --- Professional ---                           [phone], Esophagogastroduodenoscopy, flexible,                            transoral; with control of bleeding, any method Diagnosis Code(s):        --- Professional ---                           K21.00, Gastro-esophageal reflux disease with                            esophagitis, without bleeding                           K22.6, Gastro-esophageal laceration-hemorrhage                            syndrome                           K44.9, Diaphragmatic hernia without obstruction or                            gangrene                           K26.4, Chronic or unspecified duodenal ulcer with                            hemorrhage                           K26.9, Duodenal ulcer, unspecified as acute or                            chronic, without hemorrhage or perforation                           K92.0, Hematemesis                           K92.2, Gastrointestinal hemorrhage, unspecified CPT copyright 2019 American Medical Association. All rights reserved. The codes documented in this report are preliminary and upon  coder review may  be revised to meet current compliance requirements. Ladene Artist, MD 02/25/2021 10:29:37 AM This report has been signed electronically. Number of Addenda: 0

## 2021-03-16 DIAGNOSIS — J156 Pneumonia due to other aerobic Gram-negative bacteria: Secondary | ICD-10-CM

## 2021-03-16 DIAGNOSIS — J9601 Acute respiratory failure with hypoxia: Secondary | ICD-10-CM

## 2021-03-16 LAB — BASIC METABOLIC PANEL
Anion gap: 8 (ref 5–15)
BUN: 52 mg/dL — ABNORMAL HIGH (ref 8–23)
CO2: 29 mmol/L (ref 22–32)
Calcium: 7.9 mg/dL — ABNORMAL LOW (ref 8.9–10.3)
Chloride: 107 mmol/L (ref 98–111)
Creatinine, Ser: 2.23 mg/dL — ABNORMAL HIGH (ref 0.61–1.24)
GFR, Estimated: 27 mL/min — ABNORMAL LOW (ref >60)
Glucose, Bld: 125 mg/dL — ABNORMAL HIGH (ref 70–99)
Potassium: 3.4 mmol/L — ABNORMAL LOW (ref 3.5–5.1)
Sodium: 144 mmol/L (ref 135–145)

## 2021-03-16 LAB — GLUCOSE, CAPILLARY
Glucose-Capillary: 119 mg/dL — ABNORMAL HIGH (ref 70–99)
Glucose-Capillary: 108 mg/dL — ABNORMAL HIGH (ref 70–99)
Glucose-Capillary: 121 mg/dL — ABNORMAL HIGH (ref 70–99)
Glucose-Capillary: 107 mg/dL — ABNORMAL HIGH (ref 70–99)
Glucose-Capillary: 118 mg/dL — ABNORMAL HIGH (ref 70–99)

## 2021-03-16 LAB — CBC
HCT: 25.2 % — ABNORMAL LOW (ref 39.0–52.0)
Hemoglobin: 7.9 g/dL — ABNORMAL LOW (ref 13.0–17.0)
MCH: 30.9 pg (ref 26.0–34.0)
MCHC: 31.3 g/dL (ref 30.0–36.0)
MCV: 98.4 fL (ref 80.0–100.0)
Platelets: 109 10*3/uL — ABNORMAL LOW (ref 150–400)
RBC: 2.56 MIL/uL — ABNORMAL LOW (ref 4.22–5.81)
RDW: 18.4 % — ABNORMAL HIGH (ref 11.5–15.5)
WBC: 14.6 10*3/uL — ABNORMAL HIGH (ref 4.0–10.5)
nRBC: 0 % (ref 0.0–0.2)

## 2021-03-16 LAB — CULTURE, RESPIRATORY W GRAM STAIN

## 2021-03-16 LAB — MAGNESIUM: Magnesium: 2.1 mg/dL (ref 1.7–2.4)

## 2021-03-16 MED ORDER — POTASSIUM CHLORIDE 20 MEQ PO PACK
40.0000 meq | PACK | Freq: Once | ORAL | Status: AC
  Administered 2021-03-16: 11:00:00 40 meq
  Filled 2021-03-16: qty 2

## 2021-03-16 MED ORDER — ACETAMINOPHEN 325 MG PO TABS: 650.0000 mg | ORAL_TABLET | Freq: Four times a day (QID) | ORAL | Status: DC | PRN

## 2021-03-16 MED ORDER — VITAL AF 1.2 CAL PO LIQD
1000.0000 mL | ORAL | Status: DC
  Administered 2021-03-16: 17:00:00 1000 mL
  Filled 2021-03-16 (×2): qty 1000

## 2021-03-16 MED ORDER — POTASSIUM CHLORIDE 10 MEQ/50ML IV SOLN
10.0000 meq | INTRAVENOUS | Status: AC
  Administered 2021-03-16 (×2): 10 meq via INTRAVENOUS
  Filled 2021-03-16 (×2): qty 50

## 2021-03-16 MED ORDER — ACETAMINOPHEN 650 MG RE SUPP: 650.0000 mg | Freq: Four times a day (QID) | RECTAL | Status: DC | PRN

## 2021-03-16 MED ORDER — WHITE PETROLATUM EX OINT
TOPICAL_OINTMENT | CUTANEOUS | Status: AC
  Filled 2021-03-16: qty 28.35

## 2021-03-16 NOTE — Progress Notes (Signed)
Nutrition Follow-up  DOCUMENTATION CODES:   Severe malnutrition in context of chronic illness  INTERVENTION:   NG tube in place; recommend continuing EN until diet advanced and pt tolerating po. Consider changing to Cortrak tube if prolonged NG needed  Tube Feeding via NG:  Goal: Vital AF 1.2 at 60 ml/hr Increase to 30 ml/hr, titrate by 10 mL q 8 hours until goal rate of 60 ml/hr   NUTRITION DIAGNOSIS:   Severe Malnutrition related to chronic illness as evidenced by severe fat depletion, severe muscle depletion.  Being addressed via TF  GOAL:   Patient will meet greater than or equal to 90% of their needs  Progressing  MONITOR:   Vent status, Labs, Skin  REASON FOR ASSESSMENT:   Ventilator    ASSESSMENT:   87 y.o. male presented to the ED with ongoing GI bleed, black stools, and coffee ground emesis. PMH includes prostate cancer, CKD III, CABG, HTN, GERD, and CAD. Pt admitted with hematemesis and AKI on CKD.  12/26 EGD, Vomited, Intubated 12/28 Trickle TF initiated, EGD with mallory weiss tear (oozing), medium sized hiatal hernia, oozing duodenal ulcer, medium amount of food residue in stomach  Extubated this AM  Tolerating Vital AF 1.2 at 20 ml/hr that was initiated yesterday. NG retained post extubation  Labs: potassium 3.4 (L), Creatinine 2.23 Meds: miralax, colace  Diet Order:   Diet Order             Diet NPO time specified  Diet effective now                   EDUCATION NEEDS:   Not appropriate for education at this time  Skin:  Skin Assessment: Skin Integrity Issues: Skin Integrity Issues:: Stage I Stage I: Heel & Buttocks  Last BM:  No Documentation  Height:   Ht Readings from Last 1 Encounters:  03/03/2021 5\' 6"  (1.676 m)    Weight:   Wt Readings from Last 1 Encounters:  03/16/21 73.5 kg    Ideal Body Weight:  64.6 kg  BMI:  Body mass index is 26.15 kg/m.  Estimated Nutritional Needs:   Kcal:  1700-1900  Protein:   85-100 grams  Fluid:  >/= 1.7 L   Kerman Passey MS, RDN, LDN, CNSC Registered Dietitian III Clinical Nutrition RD Pager and On-Call Pager Number Located in [place]

## 2021-03-16 NOTE — Progress Notes (Signed)
NAME:  Jon Reid, MRN:  [id number], DOB:  1928-05-25, LOS: 3 ADMISSION DATE:  02/24/2021, CONSULTATION DATE:  03/04/2021 REFERRING MD:  Carmelina Peal, CHIEF COMPLAINT:   GI bleed   History of Present Illness:  Jon Reid is a 87 y.o. male w a past medical history significant for CKD stage III, hypertension, moderate AAS, CAD status post CABG, OSA, and prostate cancer who presented to the emergency department with recurrent hematemesis.  Patient has history of upper GI bleed with acute blood loss anemia and grade D esophagitis on EGD June 2022.  Of note patient was admitted 11/23 through 11/26 for recurrent bleeding underwent EGD with no active signs of bleeding.  Patient reports hematemesis began 11/25  On ED arrival patient was seen hemodynamically stable with mild tachypnea lab work significant for potassium 3.2, glucose 160, creatinine 2.80 up from 1.41, WBC 12.7, and hemoglobin 10.8 with subsequent drop to 9.3 while in the ED. GI consult and patient initially patient refused EGD but later began having more frequent hematic emesis prompting repeat and engagement of GI.  PCCM also consulted for further assistance  Pertinent  Medical History  CKD stage III, hypertension, moderate AAS, CAD status post CABG, OSA, and prostate cancer  Significant Hospital Events: Including procedures, antibiotic start and stop dates in addition to other pertinent events   12/26 presented with hematic emesis and drop in hemoglobin, hypotensive on Neo, vomited, intubated, EGD with diffuse gastritis/ 3L suctioned out, transfused 1unit PRBC 12/27 worsening O2 requirements on vent, coffee ground secretions from ETTk, started on unasyn for aspiration, increasing vasopressor requirements despite fluids, L IJ CVL placed, foley placed for retention/ worsening sCr 12/29  weaning vent    Micro / significant studies     Micro:  12/26 SARS/ flu > neg 12/26 MRSA PCR > neg 12/27 Bcx2 >  12/27 trach asp > GNR >  enterobacter aerogenes >susceptibilities pending  12/27 unasyn > 12/28 12/28 cefepime >   12/28 EGD >  - LA Grade A reflux esophagitis with no bleeding. - Mallory-Weiss tear, oozing. Treated with argon plasma coagulation (APC). - Medium-sized hiatal hernia. - A medium amount of food (residue) in the stomach. - Oozing duodenal ulcer with oozing hemorrhage (Forrest Class Ib). Treated with argon plasma coagulation (APC). - Duodenal erosions without bleeding. - No specimens collected. Interim History / Subjective:  NAEO  Remains intubated and on pressors  FiO2 55% and PEEP 8 Lytes replaced this morning   Objective   Blood pressure (!) 106/59, pulse 95, temperature 98.5 F (36.9 C), temperature source Axillary, resp. rate (!) 22, height 5\' 6"  (1.676 m), weight 73.5 kg, SpO2 97 %. CVP:  [5 mmHg-16 mmHg] 5 mmHg  Vent Mode: PRVC FiO2 (%):  [50 %-65 %] 50 % Set Rate:  [10 bmp] 10 bmp Vt Set:  [450 mL] 450 mL PEEP:  [8 cmH20] 8 cmH20 Plateau Pressure:  [15 cmH20-19 cmH20] 17 cmH20   Intake/Output Summary (Last 24 hours) at 03/16/2021 0956 Last data filed at 03/16/2021 0500 Gross per 24 hour  Intake 717.06 ml  Output 1615 ml  Net -897.94 ml   Filed Weights   03/05/2021 2130 03/14/21 0500 03/16/21 0500  Weight: 64.8 kg 69.3 kg 73.5 kg   Examination:  General:  Frail elderly M intubated sedated NAD  HEENT: NCAT ETT secure. Anicteric sclera  Neuro:  opens eyes following commands BUE BLE, generally weak  CV: rr s1s2 cap refill < 3  PULM:  Brown respiratory secretions. Symmetrical  chest expansion, mechanically ventilated  GI: soft ndnt  Extremities: no acute joint deformity no cyanosis or clubbing  Skin: pale, c/d   Resolved Hospital Problem list     Assessment & Plan:    Acute respiratory failure with hypoxia Enterobacter PNA P -wean vent -- now 50% PEEP 5  -sedation off -WUA/SBT when appropriate -cefepime   Acute UGIB with hematemesis 2/2 duodenal ulcer, severe  esophagitis, mallory-weiss tear  P:  -GI following  - continue PPI BID  -EN ok  - GI to check H pylori AB - no NSAIDS, including ASA   Shock -- mixed hemorrhagic and septic, now with more dominant septic shock picture in setting of enterobacter PNA -possible superimposed medication related hypotension 2/2 propofol  P: - wean NE for MAP > 65  -cefepime  - monitor H/H - strict I/Os  ABLA in setting of UGIB -hgb continues to trend down, now at 7.9 form 8.5 (12/29) Thrombocytopenia -in setting of bleed, plt consumption, likely decreased production 2/2 critical illness  P:  - trend CBC -holding transfusion 12/29 for now -- NE is weaning, Hgb decreased by still >7  - transfuse for Hgb < 7 or if hemodynamic compromise   AKI on CKD 3a  - Creatinine 1.41 with GFR 4710/11/08  - likely in the setting of hypotension and possible obstructive component with retention s/p foley 12/27 P: -cont foley -trend renal indices, UOP  -minimize nephrotoxins as able   Hypokalemia Hypocalcemia  P: - trend, replace as needed   Hx CAD s/p CABG Hx diastolic HF Hx HTN  -Home medication includes aspirin, Lasix P: -holding home antihypertensives given shock -holding ASAS with bleed   Prolonged Qtc - tele monitoring - avoid Qtc prolonging meds  Best Practice (right click and "Reselect all SmartList Selections" daily)   Diet/type: NPO; will start TF 12/28 DVT prophylaxis: SCD GI prophylaxis: PPI BID Lines: N/A, R radial Aline (12/26), L IJ CVL 12/27 Foley:  Yes, and it is still needed, placed12/27 Code Status:  DNR Last date of multidisciplinary goals of care discussion: family updated 12/27.   Labs   CBC: Recent Labs  Lab 02/21/2021 2146 02/25/2021 2147 03/14/21 0421 03/14/21 0958 03/14/21 1606 03/11/2021 0316 03/18/2021 0457 02/28/2021 2005 03/16/21 0333  WBC 9.6  --  6.3  --  3.1* 7.2  --   --  14.6*  NEUTROABS 8.3*  --   --   --   --   --   --   --   --   HGB 9.6*   < > 9.4*   < >  9.2* 8.5* 8.8* 8.1* 7.9*  HCT 29.3*   < > 28.3*   < > 28.5* 26.6* 26.0* 24.4* 25.2*  MCV 95.1  --  95.6  --  96.6 97.4  --   --  98.4  PLT 264  --  262  --  199 152  --   --  109*   < > = values in this interval not displayed.    Basic Metabolic Panel: Recent Labs  Lab 02/17/2021 0154 02/21/2021 1930 02/27/2021 2146 02/22/2021 2147 03/14/21 0421 03/14/21 0958 03/14/21 1112 03/03/2021 0316 02/27/2021 0457 03/16/21 0333  NA 144   < > 146*   < > 142 144 144 143 145 144  K 3.2*   < > 3.6   < > 3.8 3.5 3.4* 3.2* 3.3* 3.4*  CL 101  --  94*  --  97*  --   --  101  --  107  CO2 28  --  35*  --  32  --   --  29  --  29  GLUCOSE 160*  --  146*  --  149*  --   --  107*  --  125*  BUN 24*  --  40*  --  49*  --   --  49*  --  52*  CREATININE 2.08*  --  3.37*  --  3.34*  --   --  2.97*  --  2.23*  CALCIUM 9.3  --  8.3*  --  7.6*  --   --  7.3*  --  7.9*  MG  --   --   --   --  2.0  --   --  1.8  --  2.1  PHOS  --   --   --   --   --   --   --  4.4  --   --    < > = values in this interval not displayed.   GFR: Estimated Creatinine Clearance: 19.9 mL/min (A) (by C-G formula based on SCr of 2.23 mg/dL (H)). Recent Labs  Lab 02/27/2021 2146 03/14/21 0421 03/14/21 1014 03/14/21 1257 03/14/21 1606 03/02/2021 0316 03/16/21 0333  WBC 9.6 6.3  --   --  3.1* 7.2 14.6*  LATICACIDVEN 5.1* 2.9* 2.3* 2.4*  --   --   --     Liver Function Tests: Recent Labs  Lab 02/20/2021 0154 03/04/2021 0316  AST 30  --   ALT 15  --   ALKPHOS 94  --   BILITOT 0.7  --   PROT 7.6  --   ALBUMIN 4.1 2.3*   No results for input(s): LIPASE, AMYLASE in the last 168 hours. No results for input(s): AMMONIA in the last 168 hours.  ABG    Component Value Date/Time   PHART 7.500 (H) 03/16/2021 0457   PCO2ART 39.5 03/08/2021 0457   PO2ART 77 (L) 02/16/2021 0457   HCO3 30.8 (H) 03/14/2021 0457   TCO2 32 02/20/2021 0457   ACIDBASEDEF 4.0 (H) 11/11/2012 1933   O2SAT 96.0 03/01/2021 0457     Coagulation Profile: Recent  Labs  Lab 03/04/2021 1714  INR 1.1    Cardiac Enzymes: No results for input(s): CKTOTAL, CKMB, CKMBINDEX, TROPONINI in the last 168 hours.  HbA1C: Hemoglobin A1C  Date/Time Value Ref Range Status  05/31/2014 12:47 PM 5.5%  Final    Comment:    4.8-5.6% normal range  09/03/2013 10:19 AM 5.9  Final    Comment:    4.8-5.6 NORMAL RANGE   Hgb A1c MFr Bld  Date/Time Value Ref Range Status  11/08/2012 05:00 AM 5.5 <5.7 % Final    Comment:    (NOTE)                                                                       According to the ADA Clinical Practice Recommendations for 2011, when HbA1c is used as a screening test:  >=6.5%   Diagnostic of Diabetes Mellitus           (if abnormal result is confirmed) 5.7-6.4%   Increased risk of developing Diabetes Mellitus References:Diagnosis and Classification of Diabetes Mellitus,Diabetes KGYJ,8563,14(HFWYO  1):S62-S69 and Standards of Medical Care in         Diabetes - 2011,Diabetes VWPV,9480,16 (Suppl 1):S11-S61.    CBG: Recent Labs  Lab 02/17/2021 1609 02/26/2021 1924 03/11/2021 2318 03/16/21 0345 03/16/21 0735  GLUCAP 109* 113* 131* 119* 108*    CRITICAL CARE Performed by: Cristal Generous   Total critical care time: 37 minutes  Critical care time was exclusive of separately billable procedures and treating other patients. Critical care was necessary to treat or prevent imminent or life-threatening deterioration.  Critical care was time spent personally by me on the following activities: development of treatment plan with patient and/or surrogate as well as nursing, discussions with consultants, evaluation of patient's response to treatment, examination of patient, obtaining history from patient or surrogate, ordering and performing treatments and interventions, ordering and review of laboratory studies, ordering and review of radiographic studies, pulse oximetry and re-evaluation of patient's condition.  Eliseo Gum MSN,  AGACNP-BC [place] for pager  03/16/2021, 9:56 AM

## 2021-03-16 NOTE — Procedures (Signed)
Extubation Procedure Note  Patient Details:   Name: Jon Reid DOB: 08-Mar-1929 MRN: [id number]   Airway Documentation:    Vent end date: 03/16/21 Vent end time: 1334   Evaluation  O2 sats: stable throughout Complications: No apparent complications Patient did tolerate procedure well. Bilateral Breath Sounds: Diminished   Yes  [name] 03/16/2021, 1:37 PM

## 2021-03-16 NOTE — Progress Notes (Signed)
eLink Physician-Brief Progress Note Patient Name: Jon Reid DOB: 1928-12-20 MRN: [id number]   Date of Service  03/16/2021  HPI/Events of Note  K+ 3.4, Creatinine 2.23, GFR 27.  eICU Interventions  KCL 10 meq iv Q 1 hour x 2 via  CVC.        [name] 03/16/2021, 5:13 AM

## 2021-03-17 DIAGNOSIS — A419 Sepsis, unspecified organism: Secondary | ICD-10-CM

## 2021-03-17 DIAGNOSIS — R6521 Severe sepsis with septic shock: Secondary | ICD-10-CM

## 2021-03-17 LAB — BASIC METABOLIC PANEL
Anion gap: 5 (ref 5–15)
BUN: 50 mg/dL — ABNORMAL HIGH (ref 8–23)
CO2: 28 mmol/L (ref 22–32)
Calcium: 7.7 mg/dL — ABNORMAL LOW (ref 8.9–10.3)
Chloride: 111 mmol/L (ref 98–111)
Creatinine, Ser: 1.81 mg/dL — ABNORMAL HIGH (ref 0.61–1.24)
GFR, Estimated: 35 mL/min — ABNORMAL LOW (ref >60)
Glucose, Bld: 123 mg/dL — ABNORMAL HIGH (ref 70–99)
Potassium: 3.7 mmol/L (ref 3.5–5.1)
Sodium: 144 mmol/L (ref 135–145)

## 2021-03-17 LAB — BPAM RBC
Blood Product Expiration Date: 202301022359
Blood Product Expiration Date: 202301022359
ISSUE DATE / TIME: 202212261915
ISSUE DATE / TIME: 202212261915
Unit Type and Rh: 5100
Unit Type and Rh: 5100

## 2021-03-17 LAB — TYPE AND SCREEN
ABO/RH(D): O POS
Antibody Screen: NEGATIVE
Unit division: 0
Unit division: 0

## 2021-03-17 LAB — CBC
HCT: 22.7 % — ABNORMAL LOW (ref 39.0–52.0)
Hemoglobin: 7.6 g/dL — ABNORMAL LOW (ref 13.0–17.0)
MCH: 32.6 pg (ref 26.0–34.0)
MCHC: 33.5 g/dL (ref 30.0–36.0)
MCV: 97.4 fL (ref 80.0–100.0)
Platelets: 74 10*3/uL — ABNORMAL LOW (ref 150–400)
RBC: 2.33 MIL/uL — ABNORMAL LOW (ref 4.22–5.81)
RDW: 18.2 % — ABNORMAL HIGH (ref 11.5–15.5)
WBC: 11.7 10*3/uL — ABNORMAL HIGH (ref 4.0–10.5)
nRBC: 0 % (ref 0.0–0.2)

## 2021-03-17 LAB — GLUCOSE, CAPILLARY
Glucose-Capillary: 111 mg/dL — ABNORMAL HIGH (ref 70–99)
Glucose-Capillary: 118 mg/dL — ABNORMAL HIGH (ref 70–99)
Glucose-Capillary: 118 mg/dL — ABNORMAL HIGH (ref 70–99)
Glucose-Capillary: 129 mg/dL — ABNORMAL HIGH (ref 70–99)

## 2021-03-17 LAB — H PYLORI, IGM, IGG, IGA AB
H Pylori IgG: 0.11 Index Value (ref 0.00–0.79)
H. Pylogi, Iga Abs: <9 units (ref 0.0–8.9)
H. Pylogi, Igm Abs: <9 units (ref 0.0–8.9)

## 2021-03-17 MED ORDER — GUAIFENESIN-DM 100-10 MG/5ML PO SYRP
5.0000 mL | ORAL_SOLUTION | ORAL | Status: DC | PRN
  Administered 2021-03-17: 02:00:00 5 mL
  Filled 2021-03-17: qty 5

## 2021-03-17 NOTE — Plan of Care (Signed)
°  Problem: Education: Goal: Knowledge of General Education information will improve Description: Including pain rating scale, medication(s)/side effects and non-pharmacologic comfort measures Outcome: Progressing   Problem: Health Behavior/Discharge Planning: Goal: Ability to manage health-related needs will improve Outcome: Progressing   Problem: Clinical Measurements: Goal: Ability to maintain clinical measurements within normal limits will improve Outcome: Progressing Goal: Will remain free from infection Outcome: Progressing Goal: Diagnostic test results will improve Outcome: Progressing Goal: Respiratory complications will improve Outcome: Progressing Goal: Cardiovascular complication will be avoided Outcome: Progressing   Problem: Nutrition: Goal: Adequate nutrition will be maintained Outcome: Progressing   Problem: Coping: Goal: Level of anxiety will decrease Outcome: Progressing   Problem: Elimination: Goal: Will not experience complications related to bowel motility Outcome: Progressing Goal: Will not experience complications related to urinary retention Outcome: Progressing   Problem: Pain Managment: Goal: General experience of comfort will improve Outcome: Progressing   Problem: Safety: Goal: Ability to remain free from injury will improve Outcome: Progressing   Problem: Skin Integrity: Goal: Risk for impaired skin integrity will decrease Outcome: Progressing   Problem: Education: Goal: Ability to identify signs and symptoms of gastrointestinal bleeding will improve Outcome: Progressing

## 2021-03-17 NOTE — Evaluation (Signed)
Physical Therapy Evaluation Patient Details Name: Jon Reid MRN: [id number] DOB: 08-Aug-1928 Today's Date: 03/17/2021  History of Present Illness  87 yo male presents to [facility] on 11/23 with hematemesis, elevated troponins cards following and no plan for heart cath (suspect demand ischemia). 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 PMH includes R hip fracture s/p hemiarthroplasty 10/2020,  upper GIB and ABLA due to esophagitis, stage III chronic kidney disease, coronary artery disease with history of first-degree block, GERD, hypertension, history of prostate cancer.   Clinical Impression  Pt in bed upon arrival of PT, agreeable to evaluation at this time. Prior to admission the pt was mobilizing without need for DME or assistance for ADLs, but reports living with 2 sons. The pt now presents with limitations in functional mobility, strength, stability, and activity tolerance  due to above dx, and will continue to benefit from skilled PT to address these deficits. The pt required totalA of 2 to complete movement to sitting EOB, and totalA to maintain static sitting at this time due to posterior LOB with any reduction in support. Given significant assist needed for bed mobility at this time, will likely need continued rehab after d/c, but will continue to assess with pt improvement.     SpO2 down to 82% on 3.5-4 L O2 during activity and lying flat, sustained 89-90% on return to bed.   BP pre-activity: 98/55 (69) BP post-activity: 101/55 (69) HR 80s-90s   Recommendations for follow up therapy are one component of a multi-disciplinary discharge planning process, led by the attending physician.  Recommendations may be updated based on patient status, additional functional criteria and insurance authorization.  Follow Up Recommendations Skilled nursing-short term rehab (<3  hours/day)    Assistance Recommended at Discharge Frequent or constant Supervision/Assistance  Functional Status Assessment Patient has had a recent decline in their functional status and demonstrates the ability to make significant improvements in function in a reasonable and predictable amount of time.  Equipment Recommendations  None recommended by PT (defer to post acute)    Recommendations for Other Services       Precautions / Restrictions Precautions Precautions: Fall;Other (comment) Precaution Comments: NGT, monitor O2 (does not wear at baseline), edema Restrictions Weight Bearing Restrictions: No      Mobility  Bed Mobility Overal bed mobility: Needs Assistance Bed Mobility: Rolling;Supine to Sit;Sit to Supine Rolling: Total assist;+2 for physical assistance;+2 for safety/equipment   Supine to sit: Total assist;+2 for physical assistance;+2 for safety/equipment;HOB elevated Sit to supine: Total assist;+2 for physical assistance;+2 for safety/equipment   General bed mobility comments: Overall Total A x 2 for sitting EOB and returning to supine. pt able to assist hold to bedrail when hand placed when rolling    Transfers                   General transfer comment: unable    Ambulation/Gait                     Balance Overall balance assessment: Needs assistance Sitting-balance support: Feet supported;Bilateral upper extremity supported Sitting balance-Leahy Scale: Zero Sitting balance - Comments: Total A to maintain balance with posterior/L lateral lean if external support removed Postural control: Posterior lean;Left lateral lean  Pertinent Vitals/Pain Pain Assessment: Faces Faces Pain Scale: Hurts little more Pain Location: scrotum with bed mobility Pain Descriptors / Indicators: Grimacing;Guarding;Moaning Pain Intervention(s): Repositioned;Monitored during session    Home Living  Family/patient expects to be discharged to:: Private residence Living Arrangements: Children Available Help at Discharge: Family;Available PRN/intermittently Type of Home: House Home Access: Stairs to enter Entrance Stairs-Rails: Right Entrance Stairs-Number of Steps: 1 step in the back - pt stated they built a level entry to back porch Alternate Level Stairs-Number of Steps: flight Home Layout: Able to live on main level with bedroom/bathroom;Two level Home Equipment: Conservation officer, nature (2 wheels);Shower seat;Toilet riser      Prior Function Prior Level of Function : Independent/Modified Independent;History of Falls (last six months);Patient poor historian/Family not available             Mobility Comments: typically does not use AD, but per previous admission, had been using RW more frequently. Hx of frequent falls ADLs Comments: Pt states he is independent with ADLs, can do some IADLs in the home. family not present to confirm     Hand Dominance   Dominant Hand: Right    Extremity/Trunk Assessment   Upper Extremity Assessment Upper Extremity Assessment: Defer to OT evaluation RUE Deficits / Details: edema noted around forearm/elbow LUE Deficits / Details: edema noted around forearm/elbow    Lower Extremity Assessment Lower Extremity Assessment: Generalized weakness (limited assessment due to impaired ability to follow commands for MMT)    Cervical / Trunk Assessment Cervical / Trunk Assessment: Kyphotic  Communication   Communication: HOH  Cognition Arousal/Alertness: Lethargic Behavior During Therapy: Flat affect Overall Cognitive Status: Difficult to assess Area of Impairment: Orientation;Attention;Following commands;Memory;Safety/judgement;Problem solving;Awareness                 Orientation Level: Disoriented to;Place;Situation;Time Current Attention Level: Focused   Following Commands: Follows one step commands inconsistently;Follows one step commands  with increased time Safety/Judgement: Decreased awareness of safety;Decreased awareness of deficits Awareness: Intellectual Problem Solving: Slow processing;Requires verbal cues;Requires tactile cues General Comments: Pt with flat affect, aroused easily to voice; shows some insight to voice needs (scrotal discomfort and assist for repositioning); follows approx 50% of commands. PLOF reports similar to what pt reported in previous admission Functional Status Assessment: Patient has had a recent decline in their functional status and demonstrates the ability to make significant improvements in function in a reasonable and predictable amount of time.      General Comments General comments (skin integrity, edema, etc.): noted with penile swelling. pt voiced discomfort in both areas (said clearly "my balls" when asked what was hurting). Able to assist in bringing scrotum and penis to anterior region and elevated for comfort with pt voicing improvements    Exercises     Assessment/Plan    PT Assessment Patient needs continued PT services  PT Problem List Decreased strength;Decreased range of motion;Decreased activity tolerance;Decreased balance;Decreased mobility;Decreased safety awareness       PT Treatment Interventions DME instruction;Gait training;Stair training;Functional mobility training;Therapeutic activities;Therapeutic exercise;Balance training;Patient/family education    PT Goals (Current goals can be found in the Care Plan section)  Acute Rehab PT Goals Patient Stated Goal: none stated in session PT Goal Formulation: With patient Time For Goal Achievement: 03/31/21 Potential to Achieve Goals: Fair    Frequency Min 3X/week   Barriers to discharge        Co-evaluation PT/OT/SLP Co-Evaluation/Treatment: Yes Reason for Co-Treatment: Complexity of the patient's impairments (multi-system involvement);Necessary to address cognition/behavior during functional activity;For  patient/therapist safety PT goals addressed during session: Mobility/safety with mobility;Balance;Strengthening/ROM OT goals addressed during session: ADL's and self-care       AM-PAC PT "6 Clicks" Mobility  Outcome Measure Help needed turning from your back to your side while in a flat bed without using bedrails?: Total Help needed moving from lying on your back to sitting on the side of a flat bed without using bedrails?: Total Help needed moving to and from a bed to a chair (including a wheelchair)?: Total Help needed standing up from a chair using your arms (e.g., wheelchair or bedside chair)?: Total Help needed to walk in hospital room?: Total Help needed climbing 3-5 steps with a railing? : Total 6 Click Score: 6    End of Session Equipment Utilized During Treatment: Gait belt;Oxygen Activity Tolerance: Patient limited by fatigue Patient left: in bed;with call bell/phone within reach;with nursing/sitter in room Nurse Communication: Mobility status PT Visit Diagnosis: Other abnormalities of gait and mobility (R26.89);Muscle weakness (generalized) (M62.81)    Time: 2060-1561 PT Time Calculation (min) (ACUTE ONLY): 29 min   Charges:   PT Evaluation $PT Eval Moderate Complexity: 1 Mod          West Carbo, PT, DPT   Acute Rehabilitation Department Pager #: [phone]  Sandra Cockayne 03/17/2021, 3:09 PM

## 2021-03-17 NOTE — Progress Notes (Signed)
Clinical updates provided to son Jon Reid. Discussed possibility of moving out of ICU.  Discussed planning for "what if" should pts PNA worsen or he become unable to handle secretions.I let Jon Reid know that a palliative care order has been placed to help guide these discussions further  (There seem to be some conflicting feelings RE mechanical ventilation and goals of care -- shifting between verbiage of being a fighter and referencing how MV seemed helpful throughout Jon Reid, but then stating the family is realistic RE the pt / prognosis should he decline)   Jon Gum MSN, AGACNP-BC [place] 03/17/2021, 11:51 AM

## 2021-03-17 NOTE — Progress Notes (Signed)
NAME:  Jon Reid, MRN:  [id number], DOB:  March 16, 1929, LOS: 4 ADMISSION DATE:  02/25/2021, CONSULTATION DATE:  03/02/2021 REFERRING MD:  Carmelina Peal, CHIEF COMPLAINT:   GI bleed   History of Present Illness:  Jon Reid is a 87 y.o. male w a past medical history significant for CKD stage III, hypertension, moderate AAS, CAD status post CABG, OSA, and prostate cancer who presented to the emergency department with recurrent hematemesis.  Patient has history of upper GI bleed with acute blood loss anemia and grade D esophagitis on EGD June 2022.  Of note patient was admitted 11/23 through 11/26 for recurrent bleeding underwent EGD with no active signs of bleeding.  Patient reports hematemesis began 11/25  On ED arrival patient was seen hemodynamically stable with mild tachypnea lab work significant for potassium 3.2, glucose 160, creatinine 2.80 up from 1.41, WBC 12.7, and hemoglobin 10.8 with subsequent drop to 9.3 while in the ED. GI consult and patient initially patient refused EGD but later began having more frequent hematic emesis prompting repeat and engagement of GI.  PCCM also consulted for further assistance  Pertinent  Medical History  CKD stage III, hypertension, moderate AAS, CAD status post CABG, OSA, and prostate cancer  Significant Hospital Events: Including procedures, antibiotic start and stop dates in addition to other pertinent events   12/26 presented with hematic emesis and drop in hemoglobin, hypotensive on Neo, vomited, intubated, EGD with diffuse gastritis/ 3L suctioned out, transfused 1unit PRBC 12/27 worsening O2 requirements on vent, coffee ground secretions from ETTk, started on unasyn for aspiration, increasing vasopressor requirements despite fluids, L IJ CVL placed, foley placed for retention/ worsening sCr 12/29  weaning vent, extubated 12/30 NTS for secretions. Weaning NE   Micro / significant studies     Micro:  12/26 SARS/ flu > neg 12/26 MRSA PCR >  neg 12/27 Bcx2 >  12/27 trach asp > GNR > enterobacter aerogenes >susceptibilities pending  12/27 unasyn > 12/28 12/28 cefepime >   12/28 EGD >  - LA Grade A reflux esophagitis with no bleeding. - Mallory-Weiss tear, oozing. Treated with argon plasma coagulation (APC). - Medium-sized hiatal hernia. - A medium amount of food (residue) in the stomach. - Oozing duodenal ulcer with oozing hemorrhage (Forrest Class Ib). Treated with argon plasma coagulation (APC). - Duodenal erosions without bleeding. - No specimens collected. Interim History / Subjective:  Remains extubated Weaning NE   Cr improving now 1.81 Plt continuing to downtrend, now 74 Hgb slightly drifting down now 7.6 from 7.9 Objective   Blood pressure (!) 108/59, pulse 82, temperature 99 F (37.2 C), temperature source Axillary, resp. rate (!) 26, height 5\' 6"  (1.676 m), weight 72.5 kg, SpO2 92 %. CVP:  [5 mmHg-7 mmHg] 5 mmHg  Vent Mode: Stand-by FiO2 (%):  [40 %-50 %] 40 % Set Rate:  [10 bmp] 10 bmp Vt Set:  [450 mL] 450 mL PEEP:  [5 cmH20] 5 cmH20 Plateau Pressure:  [17 cmH20] 17 cmH20   Intake/Output Summary (Last 24 hours) at 03/17/2021 1025 Last data filed at 03/17/2021 0600 Gross per 24 hour  Intake 1035.51 ml  Output 1625 ml  Net -589.49 ml   Filed Weights   03/14/21 0500 03/16/21 0500 03/17/21 0500  Weight: 69.3 kg 73.5 kg 72.5 kg   Examination:  General:  Frail elderly M reclined in bed NAD  HEENT: NCAT pink mm anicteric sclera crusted eye exudate  Neuro:  Awake lethargic following commands  CV: rrr cap refill <  3 sec 1+ radial pulses  PULM:  Thick brown secretions. Rhonchi bilaterally Even and unlabored respirations, shallow  GI: soft ndnt + bowel sounds  Extremities: No acute joint deformity no cyanosis or clubbing  Skin pale c/d/w    Resolved Hospital Problem list    Hypokalemia Hemorrhagic shock  Need for mechanical ventilation  Assessment & Plan:   Acute respiratory failure with  hypoxia  Enterobacter PNA P -extubated 12/29  -NTS, IS, mobility  -cefepime  Acute GIB 2/2 mallory weiss tear, severe esophagitis Duodenal ulcer  P:  - continue PPI BID  -SLP eval before advancing PO diet. Continues on EN  - GI to check H pylori AB - no NSAIDS, including ASA  Septic shock 2/2 enterobacter PNA, improving  -previously mixed septic and hemorrhagic P - wean NE for MAP > 65  -cefepime  - monitor H/H - strict I/Os  Acute blood loss anemia due to GIB -hgb continues to drift down -- now 7.6 on 12/30, from 7.9 Thrombocytopenia  -in setting of bleed, plt consumption, likely decreased production 2/2 critical illness  P:  - continue to follow CBC. No indication for transfusion at present   AKI on CKD3 - Creatinine 1.41 with GFR 4710/11/08  - likely in the setting of hypotension and possible obstructive component with retention s/p foley 12/27 P: -cont foley -trend renal indices, UOP  -minimize nephrotoxins as able   Hx CAD s/p CABG Hx diastolic HF Hx HTN -Home medication includes aspirin, Lasix P: -holding home antihypertensives given shock -holding ASA with bleed   Prolonged Qtc - tele monitoring - avoid Qtc prolonging meds  Goals of Care DNR status -with thick secretions and overall debility, concern for respiratory status should secretions become unmanageable  -upon extubation, pt family was considering if they would want reintubation in such setting, though he continues as DNR -will consult palliative care  Best Practice (right click and "Reselect all SmartList Selections" daily)   Diet/type: NPO; will start TF 12/28 DVT prophylaxis: SCD GI prophylaxis: PPI BID Lines: N/A, R radial Aline (12/26), L IJ CVL 12/27 Foley:  Yes, and it is still needed, placed12/27 Code Status:  DNR Last date of multidisciplinary goals of care discussion: family updated 12/29.   Labs   CBC: Recent Labs  Lab 03/03/2021 2146 03/08/2021 2147 03/14/21 0421  03/14/21 0958 03/14/21 1606 02/25/2021 0316 03/12/2021 0457 02/21/2021 2005 03/16/21 0333 03/17/21 0300  WBC 9.6  --  6.3  --  3.1* 7.2  --   --  14.6* 11.7*  NEUTROABS 8.3*  --   --   --   --   --   --   --   --   --   HGB 9.6*   < > 9.4*   < > 9.2* 8.5* 8.8* 8.1* 7.9* 7.6*  HCT 29.3*   < > 28.3*   < > 28.5* 26.6* 26.0* 24.4* 25.2* 22.7*  MCV 95.1  --  95.6  --  96.6 97.4  --   --  98.4 97.4  PLT 264  --  262  --  199 152  --   --  109* 74*   < > = values in this interval not displayed.    Basic Metabolic Panel: Recent Labs  Lab 03/14/2021 2146 02/22/2021 2147 03/14/21 0421 03/14/21 0958 03/14/21 1112 03/10/2021 0316 02/23/2021 0457 03/16/21 0333 03/17/21 0300  NA 146*   < > 142   < > 144 143 145 144 144  K 3.6   < >  3.8   < > 3.4* 3.2* 3.3* 3.4* 3.7  CL 94*  --  97*  --   --  101  --  107 111  CO2 35*  --  32  --   --  29  --  29 28  GLUCOSE 146*  --  149*  --   --  107*  --  125* 123*  BUN 40*  --  49*  --   --  49*  --  52* 50*  CREATININE 3.37*  --  3.34*  --   --  2.97*  --  2.23* 1.81*  CALCIUM 8.3*  --  7.6*  --   --  7.3*  --  7.9* 7.7*  MG  --   --  2.0  --   --  1.8  --  2.1  --   PHOS  --   --   --   --   --  4.4  --   --   --    < > = values in this interval not displayed.   GFR: Estimated Creatinine Clearance: 24.5 mL/min (A) (by C-G formula based on SCr of 1.81 mg/dL (H)). Recent Labs  Lab 02/25/2021 2146 03/14/21 0421 03/14/21 1014 03/14/21 1257 03/14/21 1606 03/03/2021 0316 03/16/21 0333 03/17/21 0300  WBC 9.6 6.3  --   --  3.1* 7.2 14.6* 11.7*  LATICACIDVEN 5.1* 2.9* 2.3* 2.4*  --   --   --   --     Liver Function Tests: Recent Labs  Lab 03/17/2021 0154 03/14/2021 0316  AST 30  --   ALT 15  --   ALKPHOS 94  --   BILITOT 0.7  --   PROT 7.6  --   ALBUMIN 4.1 2.3*   No results for input(s): LIPASE, AMYLASE in the last 168 hours. No results for input(s): AMMONIA in the last 168 hours.  ABG    Component Value Date/Time   PHART 7.500 (H) 02/23/2021  0457   PCO2ART 39.5 02/26/2021 0457   PO2ART 77 (L) 03/17/2021 0457   HCO3 30.8 (H) 03/14/2021 0457   TCO2 32 03/07/2021 0457   ACIDBASEDEF 4.0 (H) 11/11/2012 1933   O2SAT 96.0 02/18/2021 0457     Coagulation Profile: Recent Labs  Lab 02/28/2021 1714  INR 1.1    Cardiac Enzymes: No results for input(s): CKTOTAL, CKMB, CKMBINDEX, TROPONINI in the last 168 hours.  HbA1C: Hemoglobin A1C  Date/Time Value Ref Range Status  05/31/2014 12:47 PM 5.5%  Final    Comment:    4.8-5.6% normal range  09/03/2013 10:19 AM 5.9  Final    Comment:    4.8-5.6 NORMAL RANGE   Hgb A1c MFr Bld  Date/Time Value Ref Range Status  11/08/2012 05:00 AM 5.5 <5.7 % Final    Comment:    (NOTE)                                                                       According to the ADA Clinical Practice Recommendations for 2011, when HbA1c is used as a screening test:  >=6.5%   Diagnostic of Diabetes Mellitus           (if abnormal result is confirmed) 5.7-6.4%  Increased risk of developing Diabetes Mellitus References:Diagnosis and Classification of Diabetes Mellitus,Diabetes RNHA,5790,38(BFXOV 1):S62-S69 and Standards of Medical Care in         Diabetes - 2011,Diabetes ANVB,1660,60 (Suppl 1):S11-S61.    CBG: Recent Labs  Lab 03/16/21 1121 03/16/21 1944 03/16/21 2307 03/17/21 0323 03/17/21 0741  GLUCAP 121* 107* 118* 111* 118*    CRITICAL CARE Performed by: Cristal Generous   Total critical care time: 39 minutes  Critical care time was exclusive of separately billable procedures and treating other patients. Critical care was necessary to treat or prevent imminent or life-threatening deterioration.  Critical care was time spent personally by me on the following activities: development of treatment plan with patient and/or surrogate as well as nursing, discussions with consultants, evaluation of patient's response to treatment, examination of patient, obtaining history from patient or  surrogate, ordering and performing treatments and interventions, ordering and review of laboratory studies, ordering and review of radiographic studies, pulse oximetry and re-evaluation of patient's condition.  Eliseo Gum MSN, AGACNP-BC [place] for pager  03/17/2021, 10:25 AM

## 2021-03-17 NOTE — Evaluation (Signed)
Occupational Therapy Evaluation Patient Details Name: Jon Reid MRN: [id number] DOB: 20-Mar-1928 Today's Date: 03/17/2021   History of Present Illness 87 yo male presents to [facility] on 11/23 with hematemesis, elevated troponins cards following and no plan for heart cath (suspect demand ischemia). 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 PMH includes R hip fracture s/p hemiarthroplasty 10/2020,  upper GIB and ABLA due to esophagitis, stage III chronic kidney disease, coronary artery disease with history of first-degree block, GERD, hypertension, history of prostate cancer.   Clinical Impression   PTA, pt lives with sons, typically ambulatory without AD (vs recent use of RW per previous admission), and reports Independent with ADLs. Per chart, pt with recent hx of falls at home. Pt presents now with diagnoses above and deficits in strength, sitting balance, cardiopulmonary tolerance and cognition. Pt overall Total A x 2 for bed mobility, Total A to maintain sitting balance EOB and unable to safely attempt standing today. Pt requires Total A for all ADLs bed level at this time. Pt able to demo ability to follow some one step commands though also limited by quick fatigue. Recommend SNF rehab as pt is significantly below functional baseline. Will continue to follow acutely and update DC recs as appropriate.  SpO2 down to 82% on 3.5-4 L O2 during activity and lying flat, sustained 89-90% on return to bed.   BP pre-activity: 98/55 (69) BP post-activity: 101/55 (69) HR 80s-90s     Recommendations for follow up therapy are one component of a multi-disciplinary discharge planning process, led by the attending physician.  Recommendations may be updated based on patient status, additional functional criteria and insurance authorization.   Follow Up Recommendations  Skilled  nursing-short term rehab (<3 hours/day)    Assistance Recommended at Discharge Frequent or constant Supervision/Assistance  Functional Status Assessment  Patient has had a recent decline in their functional status and demonstrates the ability to make significant improvements in function in a reasonable and predictable amount of time.  Equipment Recommendations  Wheelchair (measurements OT);Wheelchair cushion (measurements OT);Hospital bed (TBD pending progress)    Recommendations for Other Services       Precautions / Restrictions Precautions Precautions: Fall;Other (comment) Precaution Comments: NGT, monitor O2 (does not wear at baseline), edema Restrictions Weight Bearing Restrictions: No      Mobility Bed Mobility Overal bed mobility: Needs Assistance Bed Mobility: Rolling;Supine to Sit;Sit to Supine Rolling: Total assist;+2 for physical assistance;+2 for safety/equipment   Supine to sit: Total assist;+2 for physical assistance;+2 for safety/equipment;HOB elevated Sit to supine: Total assist;+2 for physical assistance;+2 for safety/equipment   General bed mobility comments: Overall Total A x 2 for sitting EOB and returning to supine. pt able to assist hold to bedrail when hand placed when rolling    Transfers                   General transfer comment: unable      Balance Overall balance assessment: Needs assistance Sitting-balance support: Feet supported;Bilateral upper extremity supported Sitting balance-Leahy Scale: Zero Sitting balance - Comments: Total A to maintain balance with posterior/L lateral lean if external support removed Postural control: Posterior lean;Left lateral lean                                 ADL either performed or assessed with  clinical judgement   ADL Overall ADL's : Needs assistance/impaired Eating/Feeding: NPO   Grooming: Wash/dry face;Total assistance;Bed level Grooming Details (indicate cue type and reason):  Total A to wash face and bring suction to mouth sitting EOB Upper Body Bathing: Total assistance   Lower Body Bathing: Total assistance   Upper Body Dressing : Total assistance   Lower Body Dressing: Total assistance;Bed level Lower Body Dressing Details (indicate cue type and reason): to don socks     Toileting- Clothing Manipulation and Hygiene: Total assistance;Bed level         General ADL Comments: Overall Total A for all ADLs due to significant weakness     Vision Ability to See in Adequate Light: 0 Adequate Patient Visual Report: No change from baseline Vision Assessment?: No apparent visual deficits     Perception     Praxis      Pertinent Vitals/Pain Pain Assessment: Faces Faces Pain Scale: No hurt Pain Location: scrotum with bed mobility Pain Descriptors / Indicators: Grimacing;Guarding;Moaning Pain Intervention(s): Repositioned;Monitored during session;Limited activity within patient's tolerance     Hand Dominance Right   Extremity/Trunk Assessment Upper Extremity Assessment Upper Extremity Assessment: Generalized weakness;RUE deficits/detail;LUE deficits/detail RUE Deficits / Details: edema noted around forearm/elbow LUE Deficits / Details: edema noted around forearm/elbow   Lower Extremity Assessment Lower Extremity Assessment: Defer to PT evaluation   Cervical / Trunk Assessment Cervical / Trunk Assessment: Kyphotic   Communication Communication Communication: HOH   Cognition Arousal/Alertness: Lethargic Behavior During Therapy: Flat affect Overall Cognitive Status: Difficult to assess Area of Impairment: Orientation;Attention;Following commands;Memory;Safety/judgement;Problem solving;Awareness                 Orientation Level: Disoriented to;Place;Situation;Time Current Attention Level: Focused   Following Commands: Follows one step commands inconsistently;Follows one step commands with increased time Safety/Judgement: Decreased  awareness of safety;Decreased awareness of deficits Awareness: Intellectual Problem Solving: Slow processing;Requires verbal cues;Requires tactile cues General Comments: Pt with flat affect, aroused easily to voice; shows some insight to voice needs (scrotal discomfort and assist for repositioning); follows approx 50% of commands. PLOF reports similar to what pt reported in previous admission     General Comments  noted with penile swelling. pt voiced discomfort in both areas (said clearly "my balls" when asked what was hurting). Able to assist in bringing scrotum and penis to anterior region and elevated for comfort with pt voicing improvements    Exercises     Shoulder Instructions      Home Living Family/patient expects to be discharged to:: Private residence Living Arrangements: Children Available Help at Discharge: Family;Available PRN/intermittently Type of Home: House Home Access: Stairs to enter [employer] of Steps: 1 step in the back - pt stated they built a level entry to back porch Entrance Stairs-Rails: Right Home Layout: Able to live on main level with bedroom/bathroom;Two level Alternate Level Stairs-Number of Steps: flight   Bathroom Shower/Tub: Tub/shower unit;Curtain   Bathroom Toilet: Standard Bathroom Accessibility: Yes   Home Equipment: Conservation officer, nature (2 wheels);Shower seat;Toilet riser          Prior Functioning/Environment Prior Level of Function : Independent/Modified Independent;History of Falls (last six months);Patient poor historian/Family not available             Mobility Comments: typically does not use AD, but per previous admission, had been using RW more frequently. Hx of frequent falls ADLs Comments: Pt states he is independent with ADLs, can do some IADLs in the home. family not present to confirm  OT Problem List: Decreased strength;Decreased activity tolerance;Impaired balance (sitting and/or standing);Decreased  cognition;Decreased knowledge of precautions;Decreased knowledge of use of DME or AE;Cardiopulmonary status limiting activity;Increased edema      OT Treatment/Interventions: Self-care/ADL training;Therapeutic exercise;Energy conservation;DME and/or AE instruction;Therapeutic activities;Patient/family education;Balance training    OT Goals(Current goals can be found in the care plan section) Acute Rehab OT Goals Patient Stated Goal: none stated OT Goal Formulation: Patient unable to participate in goal setting Time For Goal Achievement: 03/31/21 Potential to Achieve Goals: Good  OT Frequency: Min 2X/week   Barriers to D/C:            Co-evaluation PT/OT/SLP Co-Evaluation/Treatment: Yes Reason for Co-Treatment: Complexity of the patient's impairments (multi-system involvement);For patient/therapist safety;To address functional/ADL transfers   OT goals addressed during session: ADL's and self-care      AM-PAC OT "6 Clicks" Daily Activity     Outcome Measure Help from another person eating meals?: Total Help from another person taking care of personal grooming?: Total Help from another person toileting, which includes using toliet, bedpan, or urinal?: Total Help from another person bathing (including washing, rinsing, drying)?: Total Help from another person to put on and taking off regular upper body clothing?: Total Help from another person to put on and taking off regular lower body clothing?: Total 6 Click Score: 6   End of Session Equipment Utilized During Treatment: Oxygen Nurse Communication: Mobility status  Activity Tolerance: Patient limited by fatigue Patient left: in bed;with call bell/phone within reach;with nursing/sitter in room  OT Visit Diagnosis: Unsteadiness on feet (R26.81);Other abnormalities of gait and mobility (R26.89);Muscle weakness (generalized) (M62.81)                Time: 9476-5465 OT Time Calculation (min): 30 min Charges:  OT General  Charges $OT Visit: 1 Visit OT Evaluation $OT Eval Moderate Complexity: 1 Mod  Malachy Chamber, OTR/L Acute Rehab Services Office: [phone]   Layla Maw 03/17/2021, 2:45 PM

## 2021-03-17 NOTE — Plan of Care (Signed)
  Problem: Clinical Measurements: Goal: Cardiovascular complication will be avoided Outcome: Progressing   Problem: Pain Managment: Goal: General experience of comfort will improve Outcome: Progressing   Problem: Safety: Goal: Ability to remain free from injury will improve Outcome: Progressing   

## 2021-03-17 NOTE — Evaluation (Signed)
Clinical/Bedside Swallow Evaluation Patient Details  Name: Jon Reid MRN: [id number] Date of Birth: 02/16/1929  Today's Date: 03/17/2021 Time: SLP Start Time (ACUTE ONLY): 1221 SLP Stop Time (ACUTE ONLY): 1240 SLP Time Calculation (min) (ACUTE ONLY): 19 min  Past Medical History:  Past Medical History:  Diagnosis Date   Arthritis    Carcinoma of prostate ([place])    prostate   CKD (chronic kidney disease), stage III ([place])    Coronary artery disease    a. 10/2012 Cath: LM 20-30d, lAD 80p, 32m, 80-90d, LCX 90-9m, OM1 90, RCA 50p, 25m, 50d, RPDA 90, EF nl; b. 10/2012 CABG x 4 (LIMA-LAD, SVG-OM1, SVG-PDA->RPL); c. 07/2015 MV: Basal and proximal septal infarct w/o ischemia. EF 57%-->low risk.   First degree AV block 01/28/2019   Noted on EKG   GERD (gastroesophageal reflux disease)    Gout    HTN (hypertension)    Macrocytosis without anemia 01/08/2012   Moderate aortic stenosis    a. 10/2012 Echo: EF 55-60%, no rwma, BAE, mild AI, mild TR; b. 06/2020 Echo: EF 50-55%, no rwma, mild LVH. Mod dil LA. Mild MR. Mod AS.   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   Sleep apnea    Questionable   Spinal stenosis    Stroke [facility])    Past Surgical History:  Past Surgical History:  Procedure Laterality Date   BALLOON DILATION N/A 10/03/2020   Procedure: BALLOON DILATION;  Surgeon: [name], DO;  [facility];  Service: Endoscopy;  Laterality: N/A;   CARDIAC CATHETERIZATION  11/07/2012   Dr Acie Fredrickson   CATARACT EXTRACTION W/ INTRAOCULAR LENS IMPLANT Bilateral    CORONARY ARTERY BYPASS GRAFT N/A 11/11/2012   Procedure: CORONARY ARTERY BYPASS GRAFTING times four using Right Greater Saphenous Vein Graft harvested endoscopically and Left Internal Mammary Artery.;  Surgeon: Ivin Poot, MD;  Location: [place];  Service: Open Heart Surgery;  Laterality: N/A;   ESOPHAGEAL BRUSHING  10/03/2020   Procedure:  ESOPHAGEAL BRUSHING;  Surgeon: [name], DO;  [facility];  Service: Endoscopy;;   ESOPHAGOGASTRODUODENOSCOPY Left 03/07/2021   Procedure: ESOPHAGOGASTRODUODENOSCOPY (EGD);  Surgeon: Lavena Bullion, DO;  Location: [facility] ENDOSCOPY;  Service: Gastroenterology;  Laterality: Left;   ESOPHAGOGASTRODUODENOSCOPY (EGD) WITH PROPOFOL N/A 06/27/2020   Surgeon: [name], DO;  large hiatal hernia, LA grade D esophagitis with no bleeding, normal examined duodenum.    ESOPHAGOGASTRODUODENOSCOPY (EGD) WITH PROPOFOL N/A 10/03/2020   Procedure: ESOPHAGOGASTRODUODENOSCOPY (EGD) WITH PROPOFOL;  Surgeon: [name], DO;  [facility];  Service: Endoscopy;  Laterality: N/A;  2:00pm   HIP ARTHROPLASTY Right 11/13/2020   Procedure: ARTHROPLASTY BIPOLAR HIP (HEMIARTHROPLASTY);  Surgeon: Renette Butters, MD;  Location: [place];  Service: Orthopedics;  Laterality: Right;   INGUINAL HERNIA REPAIR Right 01/10/2017   Procedure: OPEN RIGHT HERNIA REPAIR INGUINAL;  Surgeon: Ileana Roup, MD;  Location: [facility];  Service: General;  Laterality: Right;   INSERTION OF MESH Right 01/10/2017   Procedure: INSERTION OF MESH;  Surgeon: Ileana Roup, MD;  Location: [facility];  Service: General;  Laterality: Right;   INTRAOPERATIVE TRANSESOPHAGEAL ECHOCARDIOGRAM N/A 11/11/2012   Procedure: INTRAOPERATIVE TRANSESOPHAGEAL ECHOCARDIOGRAM;  Surgeon: Ivin Poot, MD;  Location: [place];  Service: Open Heart Surgery;  Laterality: N/A;   LEFT HEART CATHETERIZATION WITH CORONARY ANGIOGRAM N/A 11/07/2012   Procedure: LEFT HEART CATHETERIZATION  WITH CORONARY ANGIOGRAM;  Surgeon: Thayer Headings, MD;  Location[facility] CATH LAB;  Service: Cardiovascular;  Laterality: N/A;   LUMBAR LAMINECTOMY/DECOMPRESSION MICRODISCECTOMY N/A 02/05/2019   Procedure: LUMBAR LAMINECTOMY/DECOMPRESSION L3-L4;  Surgeon: Latanya Maudlin, MD;  Location: [facility];  Service: Orthopedics;  Laterality: N/A;  13min   LYMPH NODE  DISSECTION     Bilateral pelvic   RETROPUBIC PROSTATECTOMY     Radical   HPI:  Pt is a 87 yo male presenting on 12/26 with hematemesis. ETT 12/26-12/29. EGD revealed mallory weiss tear (oozing), medium sized hiatal hernia, oozing duodenal ulcer, medium amount of food residue in stomach. Pt was recently admitted for recurrent GIB in November. Pt had MBS in September 2022 with a single instance of silent aspiration when using a cued chin tuck. No penetration or aspiration was noted with nectar thick liquids but there was also increase residue. Pt was suspected to have cervical osteophytes and esophageal component with oropharyngeal dysphagia more mild. Regular solids and thin liquids were recomemnded. PMH includes: CKD stage III, HTN, moderate AAS, CAD s/p CABG, OSA, prostate cancer    Assessment / Plan / Recommendation  Clinical Impression  Pt is lethargic and does respond to some questions and commands, but it is difficult to understand his speech and he has significant generalized weakness during oral motor exam. Upper dentures were coated in dried blood and secretions and were removed to try to better clean the dentures and his oral cavity. RN made aware of denture removal. Pt has limited labial seal and lingual manipulation, swallowing when cued to do so with ice chips but leaving the entire ice chip in his oral cavity. Even swallowing what has melted from the ice or small spoonfuls of water, pt has a lot of wet coughing. He has overt signs concerning for dysphagia and aspiration, and in current state, his risk for dysphagia related adverse event is also high. Recommend that he remain NPO with emphasis on oral hygiene. SLP Visit Diagnosis: Dysphagia, unspecified (R13.10)    Aspiration Risk  Severe aspiration risk;Risk for inadequate nutrition/hydration    Diet Recommendation NPO   Medication Administration: Via alternative means    Other  Recommendations Oral Care Recommendations: Oral care  QID Other Recommendations: Have oral suction available    Recommendations for follow up therapy are one component of a multi-disciplinary discharge planning process, led by the attending physician.  Recommendations may be updated based on patient status, additional functional criteria and insurance authorization.  Follow up Recommendations Skilled nursing-short term rehab (<3 hours/day)      Assistance Recommended at Discharge Frequent or constant Supervision/Assistance  Functional Status Assessment Patient has had a recent decline in their functional status and demonstrates the ability to make significant improvements in function in a reasonable and predictable amount of time.  Frequency and Duration min 2x/week  2 weeks       Prognosis Prognosis for Safe Diet Advancement: Good Barriers to Reach Goals: Time post onset;Severity of deficits      Swallow Study   General HPI: Pt is a 87 yo male presenting on 12/26 with hematemesis. ETT 12/26-12/29. EGD revealed mallory weiss tear (oozing), medium sized hiatal hernia, oozing duodenal ulcer, medium amount of food residue in stomach. Pt was recently admitted for recurrent GIB in November. Pt had MBS in September 2022 with a single instance of silent aspiration when using a cued chin tuck. No penetration or aspiration was noted with nectar thick liquids but there was also increase residue. Pt  was suspected to have cervical osteophytes and esophageal component with oropharyngeal dysphagia more mild. Regular solids and thin liquids were recomemnded. PMH includes: CKD stage III, HTN, moderate AAS, CAD s/p CABG, OSA, prostate cancer Type of Study: Bedside Swallow Evaluation Previous Swallow Assessment: see HPI Diet Prior to this Study: NPO;NG Tube Temperature Spikes Noted: Yes (100.4) Respiratory Status: Nasal cannula History of Recent Intubation: Yes Length of Intubations (days): 3 days Date extubated: 03/16/21 Behavior/Cognition:  Lethargic/Drowsy;Requires cueing Oral Cavity Assessment: Dry;Dried secretions Oral Care Completed by SLP: Yes Oral Cavity - Dentition: Poor condition;Dentures, top Self-Feeding Abilities: Total assist Patient Positioning: Upright in bed Baseline Vocal Quality: Hoarse;Wet Volitional Cough: Weak;Congested Volitional Swallow: Able to elicit    Oral/Motor/Sensory Function Overall Oral Motor/Sensory Function: Generalized oral weakness   Ice Chips Ice chips: Impaired Presentation: Spoon Oral Phase Impairments: Reduced labial seal;Reduced lingual movement/coordination;Poor awareness of bolus Oral Phase Functional Implications: Oral holding Pharyngeal Phase Impairments: Wet Vocal Quality;Cough - Immediate;Multiple swallows   Thin Liquid Thin Liquid: Impaired Presentation: Spoon Oral Phase Impairments: Reduced labial seal;Reduced lingual movement/coordination Oral Phase Functional Implications: Oral holding Pharyngeal  Phase Impairments: Multiple swallows;Wet Vocal Quality;Cough - Immediate    Nectar Thick Nectar Thick Liquid: Not tested   Honey Thick Honey Thick Liquid: Not tested   Puree Puree: Not tested   Solid     Solid: Not tested      Osie Bond., M.A. [place] Pager [phone] Office [phone]  03/17/2021,2:23 PM

## 2021-03-18 DIAGNOSIS — K117 Disturbances of salivary secretion: Secondary | ICD-10-CM

## 2021-03-18 DIAGNOSIS — K920 Hematemesis: Secondary | ICD-10-CM | POA: Diagnosis not present

## 2021-03-18 DIAGNOSIS — Z515 Encounter for palliative care: Secondary | ICD-10-CM | POA: Diagnosis not present

## 2021-03-18 DIAGNOSIS — R531 Weakness: Secondary | ICD-10-CM

## 2021-03-18 DIAGNOSIS — Z66 Do not resuscitate: Secondary | ICD-10-CM | POA: Diagnosis not present

## 2021-03-18 DIAGNOSIS — R4182 Altered mental status, unspecified: Secondary | ICD-10-CM | POA: Diagnosis not present

## 2021-03-18 LAB — BASIC METABOLIC PANEL
Anion gap: 10 (ref 5–15)
BUN: 54 mg/dL — ABNORMAL HIGH (ref 8–23)
CO2: 25 mmol/L (ref 22–32)
Calcium: 7.8 mg/dL — ABNORMAL LOW (ref 8.9–10.3)
Chloride: 112 mmol/L — ABNORMAL HIGH (ref 98–111)
Creatinine, Ser: 1.58 mg/dL — ABNORMAL HIGH (ref 0.61–1.24)
GFR, Estimated: 41 mL/min — ABNORMAL LOW (ref >60)
Glucose, Bld: 133 mg/dL — ABNORMAL HIGH (ref 70–99)
Potassium: 3.8 mmol/L (ref 3.5–5.1)
Sodium: 147 mmol/L — ABNORMAL HIGH (ref 135–145)

## 2021-03-18 LAB — GLUCOSE, CAPILLARY
Glucose-Capillary: 131 mg/dL — ABNORMAL HIGH (ref 70–99)
Glucose-Capillary: 138 mg/dL — ABNORMAL HIGH (ref 70–99)
Glucose-Capillary: 138 mg/dL — ABNORMAL HIGH (ref 70–99)
Glucose-Capillary: 131 mg/dL — ABNORMAL HIGH (ref 70–99)
Glucose-Capillary: 115 mg/dL — ABNORMAL HIGH (ref 70–99)

## 2021-03-18 LAB — CBC
HCT: 24.2 % — ABNORMAL LOW (ref 39.0–52.0)
Hemoglobin: 7.6 g/dL — ABNORMAL LOW (ref 13.0–17.0)
MCH: 30.5 pg (ref 26.0–34.0)
MCHC: 31.4 g/dL (ref 30.0–36.0)
MCV: 97.2 fL (ref 80.0–100.0)
Platelets: 78 10*3/uL — ABNORMAL LOW (ref 150–400)
RBC: 2.49 MIL/uL — ABNORMAL LOW (ref 4.22–5.81)
RDW: 17.9 % — ABNORMAL HIGH (ref 11.5–15.5)
WBC: 9 10*3/uL (ref 4.0–10.5)
nRBC: 0 % (ref 0.0–0.2)

## 2021-03-18 MED ORDER — LORAZEPAM 2 MG/ML IJ SOLN
1.0000 mg | INTRAMUSCULAR | Status: DC | PRN
  Administered 2021-03-19 (×2): 1 mg via INTRAVENOUS
  Filled 2021-03-18 (×2): qty 1

## 2021-03-18 MED ORDER — GLYCOPYRROLATE 0.2 MG/ML IJ SOLN
0.4000 mg | Freq: Four times a day (QID) | INTRAMUSCULAR | Status: DC
Start: 2021-03-18 — End: 2021-03-19
  Administered 2021-03-18 (×2): 0.4 mg via INTRAVENOUS
  Filled 2021-03-18 (×2): qty 2

## 2021-03-18 MED ORDER — MORPHINE SULFATE (PF) 2 MG/ML IV SOLN
1.0000 mg | INTRAVENOUS | Status: DC | PRN
  Administered 2021-03-18 – 2021-03-19 (×5): 1 mg via INTRAVENOUS
  Filled 2021-03-18 (×6): qty 1

## 2021-03-18 MED ORDER — FREE WATER
100.0000 mL | Status: DC
  Administered 2021-03-18: 100 mL

## 2021-03-18 MED ORDER — VITAL AF 1.2 CAL PO LIQD
1000.0000 mL | ORAL | Status: DC
  Administered 2021-03-18: 1000 mL
  Filled 2021-03-18 (×2): qty 1000

## 2021-03-18 MED ORDER — CHLORHEXIDINE GLUCONATE 0.12 % MT SOLN
OROMUCOSAL | Status: AC
  Administered 2021-03-18: 15 mL
  Filled 2021-03-18: qty 15

## 2021-03-18 NOTE — Plan of Care (Signed)
  Problem: Coping: Goal: Level of anxiety will decrease Outcome: Progressing   Problem: Pain Managment: Goal: General experience of comfort will improve Outcome: Progressing   

## 2021-03-18 NOTE — Consult Note (Addendum)
Consultation Note Date: 03/18/2021   Patient Name: Jon Reid  DOB: [birth year]  MRN: [id number]  Age / Sex: 87 y.o., male  PCP: Claretta Fraise, MD Referring Physician: Geradine Girt, DO  Reason for Consultation: Establishing goals of care and Psychosocial/spiritual support  HPI/Patient Profile: 87 y.o. male   admitted on 03/06/2021 with  medical history significant of prostate CA; stage 3 CKD; HTN; moderate AS; CAD s/p CABG; and OSA presenting with hematemesis.   He has a h/o UGI bleeds and ABLA with grade D esophagitis on EGD in 09/2020; he was last admitted for this from 11/23-26 and was transfused 1 unit PRBC and scheduled for outpatient GI f/u.   Significant hospital events   12/26 presented with hematic emesis and drop in hemoglobin, hypotensive on Neo, vomited, intubated, EGD with diffuse gastritis/ 3L suctioned out, transfused 1unit PRBC 12/27 worsening O2 requirements on vent, coffee ground secretions from ETTk, started on unasyn for aspiration, increasing vasopressor requirements despite fluids, L IJ CVL placed, foley placed for retention/ worsening sCr 12/29  weaning vent, extubated 12/30 NTS for secretions. Weaning NE   Today patient  is minimally responsive, audible throat secretions  and appears generally uncomfortable.    Unfortunately patient continues to decline within the context of full medical support.   Clinical Assessment and Goals of Care:  This NP Wadie Lessen reviewed medical records, received report from team, assessed the patient and then meet at the patient's bedside along with his wife and 2 sons Jon Reid and Jon Reid to discuss diagnosis, prognosis, GOC, EOL wishes disposition and options.   Concept of Palliative Care was introduced as specialized medical care for people and their families living with serious illness.  If focuses on providing relief from the symptoms and stress  of a serious illness.  The goal is to improve quality of life for both the patient and the family.  Values and goals of care important to patient and family were attempted to be elicited.  Family educated on the complex medical situation.  Eduction offered on adult failure to thrive and the limitations of medical interventions to prolong quality of life when the body fails to thrive.  He remains weak and unable to clear his own secretions, requiring respiratory therapy to suction for copious amounts of tan secretions.  Created space and opportunity for patient  and family to explore thoughts and feelings regarding current medical situation.  Patient's son Jon Reid frustration with the entire health care system.  He speaks to a less than optimal experience in the emergency room and has voiced concerns with various encounters with providers throughout  his father's hospital stay.  Therapeutic listening and I tried  to give him patient experienced contact number and encouraged him to call to voice his concerns, however, he declines the number at this time.  Patient's wife is tearful as she tells me about her life with her husband and 64 years of marriage.  They "had a good life".  Patient enjoyed fixing and collecting antique cars,  he had a car dealership business.  They have 3 sons. Family declined chaplain's support at this time.   Today patient's wife also shares the patient has had slow continued physical and functional decline over the past 3 years.  "It has been very hard for both of [name]". She verbalizes that she does not feel that her husband would "want to live like this, he always said if he could not wipe his own butt to let them go"  It  was difficult for Jon Reid to verbalize her understanding that her husband was dying.  She tells me however that she "knows" he is dying from having seen several other family members die.  Ultimately her hope for him is comfort and dignity at this  time.    Education offered today regarding advanced directives.  Concepts specific to code status, artifical feeding and hydration, continued IV antibiotics and rehospitalization was had.    The difference between a aggressive medical intervention path  and a palliative comfort care path for this patient at this time was detailed.   Plan of care: -DNR/DNI -Decrease tube feed to 20 cc an hour, DC free water -Forego life prolonging measures, allowing for natural death -No further lab draws, diagnostics, transfusions or IV antibiotics  -Minimize medications to enhance comfort only -Reevaluate in 24 to 48 hours for appropriate TOC -Prognosis is likely hours to days, anticipate hospital death   (Family was prepared that anything could happen at any time)  [name] directly asked both sons present with her today if they are in agreement and both support this decision.  Education offered on hospice benefit as it relates to residential hospice for end-of-life care.   MOST form introduced and left for review, a Hard Choices booklet was given to family    Natural trajectory and expectations at EOL were discussed.  Questions and concerns addressed.  Family  encouraged to call with questions or concerns.     PMT will continue to support holistically.         There is no documented healthcare power of attorney or advanced care planning documents. Patient's wife is next of kin, she leans on her 3 sons to help with decisions.      SUMMARY OF RECOMMENDATIONS    Code Status/Advance Care Planning: DNR   Symptom Management:  Dyspnea/pain: Morphine 1 mg IV every 2 hours as needed f Agitation: Ativan 1 mg IV every 4 hours as needed Terminal secretions: Robinul 0.4 mg IV 4 times daily ( education offered on start low and go slow approach to symptom management) )   Palliative Prophylaxis:  Aspiration, Bowel Regimen, Delirium Protocol, Frequent Pain Assessment, and Oral Care  Additional  Recommendations (Limitations, Scope, Preferences): Full Comfort Care  Psycho-social/Spiritual:  Desire for further Chaplaincy support:no Additional Recommendations: Education on Hospice  Prognosis:  Hours - Days  Discharge Planning: Anticipated Hospital Death      Primary Diagnoses: Present on Admission:  Hematemesis  AKI (acute kidney injury) ([place])  Benign essential hypertension  Chronic renal insufficiency, stage III (moderate) ([facility])  Hypokalemia  Coronary artery disease involving native coronary artery of native heart without angina pectoris  DNR (do not resuscitate)  Upper GI bleed  Gastroesophageal reflux disease with esophagitis  Shock circulatory ([place])  Endotracheally intubated   I have reviewed the medical record, interviewed the patient and family, and examined the patient. The following aspects are pertinent.  Past Medical History:  Diagnosis Date   Arthritis    Carcinoma  of prostate [facility])    prostate   CKD (chronic kidney disease), stage III ([place])    Coronary artery disease    a. 10/2012 Cath: LM 20-30d, lAD 80p, 10m 80-90d, LCX 90-977mOM1 90, RCA 50p, 2058m0d, RPDA 90, EF nl; b. 10/2012 CABG x 4 (LIMA-LAD, SVG-OM1, SVG-PDA->RPL); c. 07/2015 MV: Basal and proximal septal infarct w/o ischemia. EF 57%-->low risk.   First degree AV block 01/28/2019   Noted on EKG   GERD (gastroesophageal reflux disease)    Gout    HTN (hypertension)    Macrocytosis without anemia 01/08/2012   Moderate aortic stenosis    a. 10/2012 Echo: EF 55-60%, no rwma, BAE, mild AI, mild TR; b. 06/2020 Echo: EF 50-55%, no rwma, mild LVH. Mod dil LA. Mild MR. Mod AS.   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   Sleep apnea    Questionable   Spinal stenosis    [facility]  Social History   Socioeconomic History   Marital status: Married    Spouse name: BetInez CatalinaNumber of children: 3   Years of education:  12   Highest education level: High school graduate  Occupational History   Occupation: Retired  Tobacco Use   Smoking status: Never   Smokeless tobacco: Never  VapScientific laboratory techniciane: Never used  Substance and Sexual Activity   Alcohol use: Yes    Comment: Rare   Drug use: No   Sexual activity: Not Currently  Other Topics Concern   Not on file  Social History Narrative   Lives at home with wife/sons will stay with them occassionally   Married with 3 children   Denies caffeine use    Social Determinants of HeaRadio broadcast assistantrain: Not on file  Food Insecurity: Not on file  Transportation Needs: Not on file  Physical Activity: Not on file  Stress: Not on file  Social Connections: Not on file   Family History  Problem Relation Age of Onset   Aneurysm Father        Cerebral   Stroke Father    Hypertension Father    Cancer Mother        colon   Parkinson's disease Son    Scheduled Meds:  sodium chloride   Intravenous Once   carbidopa-levodopa  1 tablet Per Tube BID   chlorhexidine gluconate (MEDLINE KIT)  15 mL Mouth Rinse BID   Chlorhexidine Gluconate Cloth  6 each Topical Daily   docusate  100 mg Per Tube BID   free water  100 mL Per Tube Q4H   mouth rinse  15 mL Mouth Rinse 10 times per day   pantoprazole  40 mg Intravenous Q12H   polyethylene glycol  17 g Per Tube Daily   sodium chloride flush  10-40 mL Intracatheter Q12H   tobramycin  1 drop Left Eye BID   Continuous Infusions:  sodium chloride 10 mL/hr at 02/23/2021 0739   ceFEPime (MAXIPIME) IV 2 g (03/18/21 0948)   feeding supplement (VITAL AF 1.2 CAL) 40 mL/hr at 03/17/21 0050   PRN Meds:.acetaminophen **OR** acetaminophen, guaiFENesin-dextromethorphan, sodium chloride flush Medications Prior to Admission:  Prior to Admission medications   Medication Sig Start Date End Date Taking? Authorizing Provider  ascorbic acid (VITAMIN C) 500 MG tablet Take 1 tablet (500 mg total) by mouth daily.  06/30/20  Yes Barton Dubois, MD  aspirin 81 MG EC tablet Take 1 tablet (81 mg total) by mouth daily. Swallow whole. 12/08/20  Yes Love, Ivan Anchors, PA-C  carbidopa-levodopa (SINEMET IR) 25-100 MG tablet Take 1.5 tablets by mouth 3 (three) times daily. Patient taking differently: Take 1 tablet by mouth 2 (two) times daily. 09/21/20  Yes Kathrynn Ducking, MD  docusate sodium (COLACE) 100 MG capsule Take 1 capsule (100 mg total) by mouth 2 (two) times daily. Patient taking differently: Take 100 mg by mouth daily. 12/07/20  Yes Love, Ivan Anchors, PA-C  furosemide (LASIX) 40 MG tablet Take 1 tablet (40 mg total) by mouth 2 (two) times daily. One at breakfast. The other with lunch Patient taking differently: Take 40 mg by mouth daily. One at breakfast. 01/18/21  Yes Stacks, [name], MD  Iron, Ferrous Sulfate, 325 (65 Fe) MG TABS Take 1 tablet by mouth daily. Patient taking differently: Take 325 mg by mouth daily. 12/14/20  Yes Stacks, [name], MD  Multiple Vitamins-Minerals (CERTAVITE/ANTIOXIDANTS) TABS Take 1 tablet by mouth daily. 12/08/20  Yes Love, Ivan Anchors, PA-C  pantoprazole (PROTONIX) 40 MG tablet Take 1 tablet (40 mg total) by mouth 2 (two) times daily. Patient taking differently: Take 40 mg by mouth daily. 02/11/21  Yes Thurnell Lose, MD  potassium chloride SA (KLOR-CON) 10 MEQ tablet Take 2 tablets (20 mEq total) by mouth daily. For potassium replacement/ supplement Patient taking differently: Take 10 mEq by mouth daily. 01/18/21 04/18/21 Yes Stacks, [name], MD  tobramycin (TOBREX) 0.3 % ophthalmic solution Place 1 drop into the left eye 2 (two) times daily.   Yes [provider]  melatonin 5 MG TABS Take 1 tablet (5 mg total) by mouth at bedtime. Patient not taking: Reported on 03/08/2021 12/07/20   Love, Ivan Anchors, PA-C  nitroGLYCERIN (NITROSTAT) 0.4 MG SL tablet Place 1 tablet (0.4 mg total) under the tongue every 5 (five) minutes as needed for chest pain. Patient not taking: Reported on  03/06/2021 07/20/15   Erlene Quan, PA-C  senna-docusate (SENOKOT-S) 8.6-50 MG tablet Take 2 tablets by mouth daily with supper. Patient not taking: Reported on 02/21/2021 12/08/20   Bary Leriche, PA-C   Allergies  Allergen Reactions   Crestor [Rosuvastatin] Swelling    GYNECOMASTIA    Trazodone And Nefazodone     Hallucinations   Review of Systems  Unable to perform ROS: Mental status change   Physical Exam  Vital Signs: BP 97/62 (BP Location: Left Arm)    Pulse 85    Temp 99.7 F (37.6 C) (Oral)    Resp (!) 35    Ht 5' 6"  (1.676 m)    Wt 75.4 kg    SpO2 92%    BMI 26.83 kg/m  Pain Scale: 0-10   Pain Score: 0-No pain   SpO2: SpO2: 92 % O2 Device:SpO2: 92 % O2 Flow Rate: .O2 Flow Rate (L/min): 2 L/min  IO: Intake/output summary:  Intake/Output Summary (Last 24 hours) at 03/18/2021 1102 Last data filed at 03/18/2021 1028 Gross per 24 hour  Intake 978.66 ml  Output 1440 ml  Net -461.34 ml    LBM: Last BM Date:  (PTA) Baseline Weight: Weight: 69.4 kg Most recent weight: Weight: 75.4 kg     Palliative Assessment/Data:   Discussed with Dr Eliseo Squires via secure chat  Time In: 1430 Time Out: 1600 Time Total: 90 minutes Greater than 50%  of this time was spent counseling and coordinating care related to the  above assessment and plan.  Signed by: Wadie Lessen, NP   Please contact Palliative Medicine Team phone at [phone] for questions and concerns.  For individual provider: See Shea Evans

## 2021-03-18 NOTE — Progress Notes (Signed)
Patient nasally suctioned with copious amounts of tan secretions removed. Patient is unable to clear his airway. All vitals are stable after suctioning RT will continue to monitor.

## 2021-03-18 NOTE — Progress Notes (Signed)
CBG 131-not transferring.

## 2021-03-18 NOTE — Progress Notes (Signed)
PT was NT suctioned to relieve upper airway secretions. Pt is now stable sat's in 94-93 on 4L O2. Was able to suction out moderate tan secretions. RT will continue to monitor.

## 2021-03-18 NOTE — Progress Notes (Addendum)
Progress Note    Jon FREID  Reid:[id number] DOB: May 25, 1928  DOA: 03/06/2021 PCP: Claretta Fraise, MD    Brief Narrative:     Medical records reviewed and are as summarized below:  Jon Reid is an 87 y.o. male with medical history significant of prostate CA; stage 3 CKD; HTN; moderate AS; CAD s/p CABG; and OSA presenting with hematemesis. He has a h/o UGI bleeds and ABLA with grade D esophagitis on EGD in 09/2020; he was last admitted for this from 11/23-26 and was transfused 1 unit PRBC and scheduled for outpatient GI f/u.   Assessment/Plan:   Principal Problem:   Hematemesis Active Problems:   Benign essential hypertension   Chronic renal insufficiency, stage III (moderate) (HCC)   Abnormality of gait   Coronary artery disease involving native coronary artery of native heart without angina pectoris   Gastroesophageal reflux disease with esophagitis   Upper GI bleed   Pressure ulcer   AKI (acute kidney injury) ([place])   Hypokalemia   DNR (do not resuscitate)   Shock circulatory ([place])   Endotracheally intubated   Hypotension   Acute respiratory failure with hypoxia  Enterobacter PNA -extubated 12/29  -NTS- patient is unable to clear his airway - IS, mobility  -cefepime   Acute GIB 2/2 mallory weiss tear, severe esophagitis Duodenal ulcer  - continue PPI BID  -has NG Tube - no NSAIDS, including ASA   Hypernatremia -free water in NG tube  Septic shock 2/2 enterobacter PNA -cefepime   Acute blood loss anemia due to GIB -daily labs  Thrombocytopenia  -in setting of bleed, plt consumption, likely decreased production 2/2 critical illness  - continue to follow CBC. No indication for transfusion at present    AKI on CKD3 - Creatinine 1.41 with GFR 4710/11/08  -cont foley as I suspect needs end of life care   Hx CAD s/p CABG/Hx diastolic HF/Hx HTN -Home medication includes aspirin, Lasix -holding home antihypertensives given shock   Prolonged  Qtc - tele monitoring - avoid Qtc prolonging meds   Pressure Injury 02/23/2021 Buttocks Stage 1 -  Intact skin with non-blanchable redness of a localized area usually over a bony prominence. Blanchable redness around edges. Nonblanchable in center (Active)  02/24/2021 2120  Location: Buttocks  Location Orientation:   Staging: Stage 1 -  Intact skin with non-blanchable redness of a localized area usually over a bony prominence.  Wound Description (Comments): Blanchable redness around edges. Nonblanchable in center  Present on Admission: Yes     Pressure Injury 02/24/2021 Heel Posterior;Bilateral Stage 1 -  Intact skin with non-blanchable redness of a localized area usually over a bony prominence. Blanchable redness on heels (Active)  03/05/2021 2120  Location: Heel  Location Orientation: Posterior;Bilateral  Staging: Stage 1 -  Intact skin with non-blanchable redness of a localized area usually over a bony prominence.  Wound Description (Comments): Blanchable redness on heels  Present on Admission: Yes     Goals of Care DNR status -with thick secretions and unable to cough to clear -poor overall prognosis -palliative care consult appreciated -I discussed with son that I do not think he will survive this hospitalization and would recommend comfort measures      Family Communication/Anticipated D/C date and plan/Code Status   DVT prophylaxis: scd Code Status: DNR Family Communication: son on phone Disposition Plan: Status is: Inpatient  Remains inpatient appropriate because: needs goc         Medical Consultants:  PCCM GI  Subjective:   In bed, only mumbles, not able to communicate   Objective:    Vitals:   03/18/21 0714 03/18/21 0941 03/18/21 1029 03/18/21 1115  BP: (!) 106/55  97/62   Pulse: 86  85   Resp: (!) 33 20 (!) 35 20  Temp: 98.5 F (36.9 C)  99.7 F (37.6 C)   TempSrc: Oral  Oral   SpO2: 94%  92%   Weight:      Height:        Intake/Output  Summary (Last 24 hours) at 03/18/2021 1355 Last data filed at 03/18/2021 1028 Gross per 24 hour  Intake 978.66 ml  Output 1190 ml  Net -211.34 ml   Filed Weights   03/17/21 0500 03/17/21 2234 03/18/21 0413  Weight: 72.5 kg 75.3 kg 75.4 kg    Exam:  General: Appearance:     Overweight male who appears to be dying   NG tube in place  Lungs:     respirations unlabored, diminished  Heart:    Normal heart rate. Normal rhythm.    MS:   All extremities are intact.    Neurologic:   Mumbles, not able to make needs known, does not follow commands     Data Reviewed:   I have personally reviewed following labs and imaging studies:  Labs: Labs show the following:   Basic Metabolic Panel: Recent Labs  Lab 03/14/21 0421 03/14/21 0958 03/07/2021 0316 03/12/2021 0457 03/16/21 0333 03/17/21 0300 03/18/21 0423  NA 142   < > 143 145 144 144 147*  K 3.8   < > 3.2* 3.3* 3.4* 3.7 3.8  CL 97*  --  101  --  107 111 112*  CO2 32  --  29  --  _0 GLUCOSE 149*  --  107*  --  125* 123* 133*  BUN 49*  --  49*  --  52* 50* 54*  CREATININE 3.34*  --  2.97*  --  2.23* 1.81* 1.58*  CALCIUM 7.6*  --  7.3*  --  7.9* 7.7* 7.8*  MG 2.0  --  1.8  --  2.1  --   --   PHOS  --   --  4.4  --   --   --   --    < > = values in this interval not displayed.   GFR Estimated Creatinine Clearance: 28 mL/min (A) (by C-G formula based on SCr of 1.58 mg/dL (H)). Liver Function Tests: Recent Labs  Lab 02/18/2021 0154 03/03/2021 0316  AST 30  --   ALT 15  --   ALKPHOS 94  --   BILITOT 0.7  --   PROT 7.6  --   ALBUMIN 4.1 2.3*   No results for input(s): LIPASE, AMYLASE in the last 168 hours. No results for input(s): AMMONIA in the last 168 hours. Coagulation profile Recent Labs  Lab 03/01/2021 1714  INR 1.1    CBC: Recent Labs  Lab 03/16/2021 2146 03/06/2021 2147 03/14/21 1606 02/26/2021 0316 02/26/2021 0457 03/08/2021 2005 03/16/21 0333 03/17/21 0300 03/18/21 0423  WBC 9.6   < > 3.1* 7.2  --   --   14.6* 11.7* 9.0  NEUTROABS 8.3*  --   --   --   --   --   --   --   --   HGB 9.6*   < > 9.2* 8.5* 8.8* 8.1* 7.9* 7.6* 7.6*  HCT 29.3*   < >  28.5* 26.6* 26.0* 24.4* 25.2* 22.7* 24.2*  MCV 95.1   < > 96.6 97.4  --   --  98.4 97.4 97.2  PLT 264   < > 199 152  --   --  109* 74* 78*   < > = values in this interval not displayed.   Cardiac Enzymes: No results for input(s): CKTOTAL, CKMB, CKMBINDEX, TROPONINI in the last 168 hours. BNP (last 3 results) Recent Labs    01/05/21 1251  PROBNP 1,725*   CBG: Recent Labs  Lab 03/17/21 1933 03/18/21 0009 03/18/21 0410 03/18/21 0814 03/18/21 1102  GLUCAP 129* 131* 138* 138* 131*   D-Dimer: No results for input(s): DDIMER in the last 72 hours. Hgb A1c: No results for input(s): HGBA1C in the last 72 hours. Lipid Profile: No results for input(s): CHOL, HDL, LDLCALC, TRIG, CHOLHDL, LDLDIRECT in the last 72 hours. Thyroid function studies: No results for input(s): TSH, T4TOTAL, T3FREE, THYROIDAB in the last 72 hours.  Invalid input(s): FREET3 Anemia work up: No results for input(s): VITAMINB12, FOLATE, FERRITIN, TIBC, IRON, RETICCTPCT in the last 72 hours. Sepsis Labs: Recent Labs  Lab 03/10/2021 2146 03/14/21 0421 03/14/21 1014 03/14/21 1257 03/14/21 1606 03/06/2021 0316 03/16/21 0333 03/17/21 0300 03/18/21 0423  WBC 9.6 6.3  --   --    < > 7.2 14.6* 11.7* 9.0  LATICACIDVEN 5.1* 2.9* 2.3* 2.4*  --   --   --   --   --    < > = values in this interval not displayed.    Microbiology Recent Results (from the past 240 hour(s))  Resp Panel by RT-PCR (Flu A&B, Covid) Nasopharyngeal Swab     Status: None   Collection Time: 03/16/2021  9:27 AM   Specimen: Nasopharyngeal Swab; Nasopharyngeal(NP) swabs in vial transport medium  Result Value Ref Range Status   SARS Coronavirus 2 by RT PCR NEGATIVE NEGATIVE Final    Comment: (NOTE) SARS-CoV-2 target nucleic acids are NOT DETECTED.  The SARS-CoV-2 RNA is generally detectable in upper  respiratory specimens during the acute phase of infection. The lowest concentration of SARS-CoV-2 viral copies this assay can detect is 138 copies/mL. A negative result does not preclude SARS-Cov-2 infection and should not be used as the sole basis for treatment or other patient management decisions. A negative result may occur with  improper specimen collection/handling, submission of specimen other than nasopharyngeal swab, presence of viral mutation(s) within the areas targeted by this assay, and inadequate number of viral copies(<138 copies/mL). A negative result must be combined with clinical observations, patient history, and epidemiological information. The expected result is Negative.  Fact Sheet for Patients:  EntrepreneurPulse.com.au  Fact Sheet for Healthcare Providers:  IncredibleEmployment.be  This test is no t yet approved or cleared by the [place] FDA and  has been authorized for detection and/or diagnosis of SARS-CoV-2 by FDA under an Emergency Use Authorization (EUA). This EUA will remain  in effect (meaning this test can be used) for the duration of the COVID-19 declaration under Section 564(b)(1) of the Act, 21 U.S.C.section 360bbb-3(b)(1), unless the authorization is terminated  or revoked sooner.       Influenza A by PCR NEGATIVE NEGATIVE Final   Influenza B by PCR NEGATIVE NEGATIVE Final    Comment: (NOTE) The Xpert Xpress SARS-CoV-2/FLU/RSV plus assay is intended as an aid in the diagnosis of influenza from Nasopharyngeal swab specimens and should not be used as a sole basis for treatment. Nasal washings and aspirates are unacceptable for  Xpert Xpress SARS-CoV-2/FLU/RSV testing.  Fact Sheet for Patients: EntrepreneurPulse.com.au  Fact Sheet for Healthcare Providers: IncredibleEmployment.be  This test is not yet approved or cleared by the [place] FDA and has been  authorized for detection and/or diagnosis of SARS-CoV-2 by FDA under an Emergency Use Authorization (EUA). This EUA will remain in effect (meaning this test can be used) for the duration of the COVID-19 declaration under Section 564(b)(1) of the Act, 21 U.S.C. section 360bbb-3(b)(1), unless the authorization is terminated or revoked.  Performed at [facility], [place] [address] [place], [place] [address]   MRSA Next Gen by PCR, Nasal     Status: None   Collection Time: 02/21/2021  9:02 PM   Specimen: Nasal Mucosa; Nasal Swab  Result Value Ref Range Status   MRSA by PCR Next Gen NOT DETECTED NOT DETECTED Final    Comment: (NOTE) The GeneXpert MRSA Assay (FDA approved for NASAL specimens only), is one component of a comprehensive MRSA colonization surveillance program. It is not intended to diagnose MRSA infection nor to guide or monitor treatment for MRSA infections. Test performance is not FDA approved in patients less than 25 years old. Performed at [facility], [place] [address], [place], [place] [address]   Culture, Respiratory w Gram Stain     Status: None   Collection Time: 03/14/21 10:04 AM   Specimen: Tracheal Aspirate; Respiratory  Result Value Ref Range Status   Specimen Description TRACHEAL ASPIRATE  Final   Special Requests NONE  Final   Gram Stain   Final    FEW WBC PRESENT,BOTH PMN AND MONONUCLEAR MODERATE GRAM NEGATIVE RODS Performed at [facility], [place] [address], [place], [place] [address]    Culture ABUNDANT ENTEROBACTER AEROGENES  Final   Report Status 03/16/2021 FINAL  Final   Organism ID, Bacteria ENTEROBACTER AEROGENES  Final      Susceptibility   Enterobacter aerogenes - MIC*    CEFAZOLIN >=64 RESISTANT Resistant     CEFEPIME <=0.12 SENSITIVE Sensitive     CEFTAZIDIME <=1 SENSITIVE Sensitive     CEFTRIAXONE <=0.25 SENSITIVE Sensitive     CIPROFLOXACIN <=0.25 SENSITIVE Sensitive     GENTAMICIN <=1 SENSITIVE Sensitive     IMIPENEM 2  SENSITIVE Sensitive     TRIMETH/SULFA <=20 SENSITIVE Sensitive     PIP/TAZO <=4 SENSITIVE Sensitive     * ABUNDANT ENTEROBACTER AEROGENES  Culture, blood (routine x 2)     Status: None (Preliminary result)   Collection Time: 03/14/21  1:11 PM   Specimen: BLOOD LEFT HAND  Result Value Ref Range Status   Specimen Description BLOOD LEFT HAND  Final   Special Requests   Final    BOTTLES DRAWN AEROBIC AND ANAEROBIC Blood Culture adequate volume   Culture   Final    NO GROWTH 4 DAYS Performed at [facility], [address], [place], [place] [address]    Report Status PENDING  Incomplete  Culture, blood (routine x 2)     Status: None (Preliminary result)   Collection Time: 03/14/21  1:19 PM   Specimen: BLOOD LEFT HAND  Result Value Ref Range Status   Specimen Description BLOOD LEFT HAND  Final   Special Requests   Final    BOTTLES DRAWN AEROBIC ONLY Blood Culture results may not be optimal due to an inadequate volume of blood received in culture bottles   Culture   Final    NO GROWTH 4 DAYS Performed at [facility], [place] [address] [place], [place] [address]    Report Status PENDING  Incomplete    Procedures and diagnostic studies:  No results found.  Medications:    sodium chloride   Intravenous Once   carbidopa-levodopa  1 tablet Per Tube BID   chlorhexidine gluconate (MEDLINE KIT)  15 mL Mouth Rinse BID   Chlorhexidine Gluconate Cloth  6 each Topical Daily   docusate  100 mg Per Tube BID   free water  100 mL Per Tube Q4H   mouth rinse  15 mL Mouth Rinse 10 times per day   pantoprazole  40 mg Intravenous Q12H   polyethylene glycol  17 g Per Tube Daily   sodium chloride flush  10-40 mL Intracatheter Q12H   tobramycin  1 drop Left Eye BID   Continuous Infusions:  sodium chloride 10 mL/hr at 02/18/2021 0739   ceFEPime (MAXIPIME) IV 2 g (03/18/21 0948)   feeding supplement (VITAL AF 1.2 CAL) 40 mL/hr at 03/17/21 0050     LOS: 5 days   Geradine Girt  [facility]   How to contact the [facility] Attending or Consulting provider [place] or covering provider during after hours [place], for this patient?  Check the care team in [facility] and look for a) attending/consulting [facility] provider listed and b) the [facility] team listed Log into [email or web address] and use [place]'s universal password to access. If you do not have the password, please contact the hospital operator. Locate the [facility] provider you are looking for under [facility] and page to a number that you can be directly reached. If you still have difficulty reaching the provider, please page the [facility] (Director on Call) for the Hospitalists listed on amion for assistance.  03/18/2021, 1:55 PM

## 2021-03-19 ENCOUNTER — Encounter (HOSPITAL_COMMUNITY): Payer: Self-pay | Admitting: Gastroenterology

## 2021-03-19 DIAGNOSIS — K920 Hematemesis: Secondary | ICD-10-CM | POA: Diagnosis not present

## 2021-03-19 DIAGNOSIS — N179 Acute kidney failure, unspecified: Secondary | ICD-10-CM | POA: Diagnosis not present

## 2021-03-19 LAB — GLUCOSE, CAPILLARY
Glucose-Capillary: 131 mg/dL — ABNORMAL HIGH (ref 70–99)
Glucose-Capillary: 115 mg/dL — ABNORMAL HIGH (ref 70–99)

## 2021-03-19 LAB — CULTURE, BLOOD (ROUTINE X 2)
Culture: NO GROWTH
Culture: NO GROWTH
Special Requests: ADEQUATE

## 2021-03-19 MED ORDER — LORAZEPAM 2 MG/ML IJ SOLN: 1.0000 mg | Freq: Two times a day (BID) | INTRAMUSCULAR | Status: DC | PRN

## 2021-03-19 MED ORDER — CHLORHEXIDINE GLUCONATE 0.12 % MT SOLN
OROMUCOSAL | Status: AC
  Filled 2021-03-19: qty 15

## 2021-03-19 MED ORDER — GLYCOPYRROLATE 0.2 MG/ML IJ SOLN
0.4000 mg | INTRAMUSCULAR | Status: DC | PRN
  Administered 2021-03-19 (×4): 0.4 mg via INTRAVENOUS
  Filled 2021-03-19 (×4): qty 2

## 2021-03-19 MED ORDER — MORPHINE SULFATE (PF) 2 MG/ML IV SOLN
2.0000 mg | INTRAVENOUS | Status: DC | PRN
  Administered 2021-03-19: 2 mg via INTRAVENOUS
  Filled 2021-03-19: qty 1

## 2021-03-19 NOTE — Plan of Care (Signed)

## 2021-03-19 NOTE — Progress Notes (Signed)
Patient saturations beginning to decline. Venturi mask placed at 4L for comfort rather than nasal cannula due to mouth breathing. Patient's son notified of patient status change and decline.

## 2021-03-19 NOTE — Progress Notes (Signed)
Progress Note    Jon Reid  TMH:[id number] DOB: [birth year]  DOA: 03/18/2021 PCP: Claretta Fraise, MD    Brief Narrative:     Medical records reviewed and are as summarized below:  Jon Reid is an 88 y.o. male with medical history significant of prostate CA; stage 3 CKD; HTN; moderate AS; CAD s/p CABG; and OSA presenting with hematemesis. He has a h/o UGI bleeds and ABLA with grade D esophagitis on EGD in 09/2020; he was last admitted for this from 11/23-26 and was transfused 1 unit PRBC and scheduled for outpatient GI f/u.  He has continued to worsen and now has been transitioned to comfort care.   Assessment/Plan:   Principal Problem:   Hematemesis Active Problems:   Benign essential hypertension   Chronic renal insufficiency, stage III (moderate) (HCC)   Abnormality of gait   Coronary artery disease involving native coronary artery of native heart without angina pectoris   Gastroesophageal reflux disease with esophagitis   Upper GI bleed   Pressure ulcer   AKI (acute kidney injury) ([place])   Hypokalemia   DNR (do not resuscitate)   Shock circulatory ([place])   Endotracheally intubated   Hypotension   Acute respiratory failure with hypoxia  Enterobacter PNA -extubated 12/29  -transitioned to comfort care   Acute GIB 2/2 mallory weiss tear, severe esophagitis Duodenal ulcer  - continue PPI BID  -transitioned to comfort care   Hypernatremia =transition to comfort care  Septic shock 2/2 enterobacter PNA -resolved  Acute blood loss anemia due to GIB -transitioned to comfort care  Thrombocytopenia  AKI on CKD3  Hx CAD s/p CABG/Hx diastolic HF/Hx HTN Prolonged Qtc   Pressure Injury 03/09/2021 Buttocks Stage 1 -  Intact skin with non-blanchable redness of a localized area usually over a bony prominence. Blanchable redness around edges. Nonblanchable in center (Active)  02/25/2021 2120  Location: Buttocks  Location Orientation:   Staging: Stage 1 -  Intact  skin with non-blanchable redness of a localized area usually over a bony prominence.  Wound Description (Comments): Blanchable redness around edges. Nonblanchable in center  Present on Admission: Yes     Pressure Injury 02/19/2021 Heel Posterior;Bilateral Stage 1 -  Intact skin with non-blanchable redness of a localized area usually over a bony prominence. Blanchable redness on heels (Active)  03/17/2021 2120  Location: Heel  Location Orientation: Posterior;Bilateral  Staging: Stage 1 -  Intact skin with non-blanchable redness of a localized area usually over a bony prominence.  Wound Description (Comments): Blanchable redness on heels  Present on Admission: Yes     Goals of Care DNR status Transitioned to comfort care 12/31- anticipate in hospital death    Family Communication/Anticipated D/C date and plan/Code Status   DVT prophylaxis: scd Code Status: DNR Family Communication: at bedside Disposition Plan: Status is: Inpatient  Remains inpatient appropriate because: anticipate in hospital death         Medical Consultants:   PCCM GI  Subjective:   Minimally responsive  Objective:    Vitals:   03/18/21 2028 03/18/21 2237 03/19/21 0310 03/19/21 1230  BP:    (!) 67/43  Pulse:    (!) 101  Resp: (!) 41 (!) 45  20  Temp:    98 F (36.7 C)  TempSrc:    Oral  SpO2:    91%  Weight:   78.8 kg   Height:        Intake/Output Summary (Last 24 hours) at 03/19/2021 1244  Last data filed at 03/19/2021 1235 Gross per 24 hour  Intake 0 ml  Output 1200 ml  Net -1200 ml   Filed Weights   03/17/21 2234 03/18/21 0413 03/19/21 0310  Weight: 75.3 kg 75.4 kg 78.8 kg    Exam:  General: Appearance:     Overweight male with mild respiratory distress, appears to be dying-- NRB on     Lungs:     Coarse breath sounds  Heart:    Tachycardic.              Data Reviewed:   I have personally reviewed following labs and imaging studies:  Labs: Labs show the following:    Basic Metabolic Panel: Recent Labs  Lab 03/14/21 0421 03/14/21 0958 02/18/2021 0316 02/26/2021 0457 03/16/21 0333 03/17/21 0300 03/18/21 0423  NA 142   < > 143 145 144 144 147*  K 3.8   < > 3.2* 3.3* 3.4* 3.7 3.8  CL 97*  --  101  --  107 111 112*  CO2 32  --  29  --  29 28 25   GLUCOSE 149*  --  107*  --  125* 123* 133*  BUN 49*  --  49*  --  52* 50* 54*  CREATININE 3.34*  --  2.97*  --  2.23* 1.81* 1.58*  CALCIUM 7.6*  --  7.3*  --  7.9* 7.7* 7.8*  MG 2.0  --  1.8  --  2.1  --   --   PHOS  --   --  4.4  --   --   --   --    < > = values in this interval not displayed.   GFR Estimated Creatinine Clearance: 30.7 mL/min (A) (by C-G formula based on SCr of 1.58 mg/dL (H)). Liver Function Tests: Recent Labs  Lab 02/21/2021 0154 03/17/2021 0316  AST 30  --   ALT 15  --   ALKPHOS 94  --   BILITOT 0.7  --   PROT 7.6  --   ALBUMIN 4.1 2.3*   No results for input(s): LIPASE, AMYLASE in the last 168 hours. No results for input(s): AMMONIA in the last 168 hours. Coagulation profile Recent Labs  Lab 02/27/2021 1714  INR 1.1    CBC: Recent Labs  Lab 03/14/2021 2146 02/19/2021 2147 03/14/21 1606 03/04/2021 0316 02/16/2021 0457 03/12/2021 2005 03/16/21 0333 03/17/21 0300 03/18/21 0423  WBC 9.6   < > 3.1* 7.2  --   --  14.6* 11.7* 9.0  NEUTROABS 8.3*  --   --   --   --   --   --   --   --   HGB 9.6*   < > 9.2* 8.5* 8.8* 8.1* 7.9* 7.6* 7.6*  HCT 29.3*   < > 28.5* 26.6* 26.0* 24.4* 25.2* 22.7* 24.2*  MCV 95.1   < > 96.6 97.4  --   --  98.4 97.4 97.2  PLT 264   < > 199 152  --   --  109* 74* 78*   < > = values in this interval not displayed.   Cardiac Enzymes: No results for input(s): CKTOTAL, CKMB, CKMBINDEX, TROPONINI in the last 168 hours. BNP (last 3 results) Recent Labs    01/05/21 1251  PROBNP 1,725*   CBG: Recent Labs  Lab 03/18/21 0410 03/18/21 0814 03/18/21 1102 03/18/21 1639 03/19/21 1232  GLUCAP 138* 138* 131* 115* 131*   D-Dimer: No results for input(s):  DDIMER in the  last 72 hours. Hgb A1c: No results for input(s): HGBA1C in the last 72 hours. Lipid Profile: No results for input(s): CHOL, HDL, LDLCALC, TRIG, CHOLHDL, LDLDIRECT in the last 72 hours. Thyroid function studies: No results for input(s): TSH, T4TOTAL, T3FREE, THYROIDAB in the last 72 hours.  Invalid input(s): FREET3 Anemia work up: No results for input(s): VITAMINB12, FOLATE, FERRITIN, TIBC, IRON, RETICCTPCT in the last 72 hours. Sepsis Labs: Recent Labs  Lab 02/27/2021 2146 03/14/21 0421 03/14/21 1014 03/14/21 1257 03/14/21 1606 02/21/2021 0316 03/16/21 0333 03/17/21 0300 03/18/21 0423  WBC 9.6 6.3  --   --    < > 7.2 14.6* 11.7* 9.0  LATICACIDVEN 5.1* 2.9* 2.3* 2.4*  --   --   --   --   --    < > = values in this interval not displayed.    Microbiology Recent Results (from the past 240 hour(s))  Resp Panel by RT-PCR (Flu A&B, Covid) Nasopharyngeal Swab     Status: None   Collection Time: 03/14/2021  9:27 AM   Specimen: Nasopharyngeal Swab; Nasopharyngeal(NP) swabs in vial transport medium  Result Value Ref Range Status   SARS Coronavirus 2 by RT PCR NEGATIVE NEGATIVE Final    Comment: (NOTE) SARS-CoV-2 target nucleic acids are NOT DETECTED.  The SARS-CoV-2 RNA is generally detectable in upper respiratory specimens during the acute phase of infection. The lowest concentration of SARS-CoV-2 viral copies this assay can detect is 138 copies/mL. A negative result does not preclude SARS-Cov-2 infection and should not be used as the sole basis for treatment or other patient management decisions. A negative result may occur with  improper specimen collection/handling, submission of specimen other than nasopharyngeal swab, presence of viral mutation(s) within the areas targeted by this assay, and inadequate number of viral copies(<138 copies/mL). A negative result must be combined with clinical observations, patient history, and epidemiological information. The  expected result is Negative.  Fact Sheet for Patients:  EntrepreneurPulse.com.au  Fact Sheet for Healthcare Providers:  IncredibleEmployment.be  This test is no t yet approved or cleared by the [place] FDA and  has been authorized for detection and/or diagnosis of SARS-CoV-2 by FDA under an Emergency Use Authorization (EUA). This EUA will remain  in effect (meaning this test can be used) for the duration of the COVID-19 declaration under Section 564(b)(1) of the Act, 21 U.S.C.section 360bbb-3(b)(1), unless the authorization is terminated  or revoked sooner.       Influenza A by PCR NEGATIVE NEGATIVE Final   Influenza B by PCR NEGATIVE NEGATIVE Final    Comment: (NOTE) The Xpert Xpress SARS-CoV-2/FLU/RSV plus assay is intended as an aid in the diagnosis of influenza from Nasopharyngeal swab specimens and should not be used as a sole basis for treatment. Nasal washings and aspirates are unacceptable for Xpert Xpress SARS-CoV-2/FLU/RSV testing.  Fact Sheet for Patients: EntrepreneurPulse.com.au  Fact Sheet for Healthcare Providers: IncredibleEmployment.be  This test is not yet approved or cleared by the [place] FDA and has been authorized for detection and/or diagnosis of SARS-CoV-2 by FDA under an Emergency Use Authorization (EUA). This EUA will remain in effect (meaning this test can be used) for the duration of the COVID-19 declaration under Section 564(b)(1) of the Act, 21 U.S.C. section 360bbb-3(b)(1), unless the authorization is terminated or revoked.  Performed at [facility], [place] [address], [place], [place] [address]   MRSA Next Gen by PCR, Nasal     Status: None   Collection Time: 02/21/2021  9:02 PM   Specimen: Nasal Mucosa; Nasal Swab  Result Value Ref Range Status   MRSA by PCR Next Gen NOT DETECTED NOT DETECTED Final    Comment: (NOTE) The GeneXpert MRSA Assay (FDA  approved for NASAL specimens only), is one component of a comprehensive MRSA colonization surveillance program. It is not intended to diagnose MRSA infection nor to guide or monitor treatment for MRSA infections. Test performance is not FDA approved in patients less than 101 years old. Performed at [facility], [place] [address], [place], [place] [address]   Culture, Respiratory w Gram Stain     Status: None   Collection Time: 03/14/21 10:04 AM   Specimen: Tracheal Aspirate; Respiratory  Result Value Ref Range Status   Specimen Description TRACHEAL ASPIRATE  Final   Special Requests NONE  Final   Gram Stain   Final    FEW WBC PRESENT,BOTH PMN AND MONONUCLEAR MODERATE GRAM NEGATIVE RODS Performed at [facility], [place] [address], [place], [place] [address]    Culture ABUNDANT ENTEROBACTER AEROGENES  Final   Report Status 03/16/2021 FINAL  Final   Organism ID, Bacteria ENTEROBACTER AEROGENES  Final      Susceptibility   Enterobacter aerogenes - MIC*    CEFAZOLIN >=64 RESISTANT Resistant     CEFEPIME <=0.12 SENSITIVE Sensitive     CEFTAZIDIME <=1 SENSITIVE Sensitive     CEFTRIAXONE <=0.25 SENSITIVE Sensitive     CIPROFLOXACIN <=0.25 SENSITIVE Sensitive     GENTAMICIN <=1 SENSITIVE Sensitive     IMIPENEM 2 SENSITIVE Sensitive     TRIMETH/SULFA <=20 SENSITIVE Sensitive     PIP/TAZO <=4 SENSITIVE Sensitive     * ABUNDANT ENTEROBACTER AEROGENES  Culture, blood (routine x 2)     Status: None   Collection Time: 03/14/21  1:11 PM   Specimen: BLOOD LEFT HAND  Result Value Ref Range Status   Specimen Description BLOOD LEFT HAND  Final   Special Requests   Final    BOTTLES DRAWN AEROBIC AND ANAEROBIC Blood Culture adequate volume   Culture   Final    NO GROWTH 5 DAYS Performed at [facility], [place] [address] [place], [place] [address]    Report Status 03/19/2021 FINAL  Final  Culture, blood (routine x 2)     Status: None   Collection Time: 03/14/21  1:19 PM    Specimen: BLOOD LEFT HAND  Result Value Ref Range Status   Specimen Description BLOOD LEFT HAND  Final   Special Requests   Final    BOTTLES DRAWN AEROBIC ONLY Blood Culture results may not be optimal due to an inadequate volume of blood received in culture bottles   Culture   Final    NO GROWTH 5 DAYS Performed at [facility], [place] [address], [place], [place] [address]    Report Status 03/19/2021 FINAL  Final    Procedures and diagnostic studies:  No results found.  Medications:    sodium chloride   Intravenous Once   carbidopa-levodopa  1 tablet Per Tube BID   chlorhexidine gluconate (MEDLINE KIT)  15 mL Mouth Rinse BID   Chlorhexidine Gluconate Cloth  6 each Topical Daily   mouth rinse  15 mL Mouth Rinse 10 times per day   sodium chloride flush  10-40 mL Intracatheter Q12H   tobramycin  1 drop Left Eye BID   Continuous Infusions:     LOS: 6 days   Geradine Girt  [facility]   How to contact the [facility] Attending or Consulting provider [place] or covering provider during after hours [place], for this patient?  Check the care team in [facility] and look for a) attending/consulting [facility] provider listed and b) the [facility] team listed Log into [email or web address] and use [place]'s universal password to access. If you do not have the password, please contact the hospital operator. Locate the [facility] provider you are looking for under [facility] and page to a number that you can be directly reached. If you still have difficulty reaching the provider, please page the [facility] (Director on Call) for the Hospitalists listed on amion for assistance.  03/19/2021, 12:44 PM

## 2021-03-19 DEATH — deceased

## 2021-03-28 ENCOUNTER — Ambulatory Visit: Payer: MEDICARE | Admitting: Family Medicine

## 2021-04-19 ENCOUNTER — Ambulatory Visit: Payer: MEDICARE | Admitting: Internal Medicine

## 2021-04-19 NOTE — Death Summary Note (Signed)
DEATH SUMMARY   Patient Details  Name: Jon Reid MRN: [id number] DOB: [birth year] [id number], Jon Gash, MD  Admission/Discharge Information   Admit Date:  03/16/21  Date of Death: Date of Death: 23-Mar-2021  Time of Death: Time of Death: 0143  Length of Stay: 7     Hospital Diagnoses: Principal Problem:   Hematemesis Active Problems:   Benign essential hypertension   Chronic renal insufficiency, stage III (moderate) (HCC)   Abnormality of gait   Coronary artery disease involving native coronary artery of native heart without angina pectoris   Gastroesophageal reflux disease with esophagitis   Upper GI bleed   Pressure ulcer   AKI (acute kidney injury) ([place])   Hypokalemia   DNR (do not resuscitate)   Shock circulatory ([place])   Endotracheally intubated   Hypotension   Hospital Course: [name] is an 88 y.o. male with medical history significant of prostate CA; stage 3 CKD; HTN; moderate AS; CAD s/p CABG; and OSA presenting with hematemesis. He has a h/o UGI bleeds and ABLA with grade D esophagitis on EGD in 09/2020; he was last admitted for this from 11/23-26 and was transfused 1 unit PRBC and scheduled for outpatient GI f/u.  He has continued to worsen and now has been transitioned to comfort care.   Acute respiratory failure with hypoxia  Enterobacter PNA -extubated 12/29  -transitioned to comfort care   Acute GIB 2/2 mallory weiss tear, severe esophagitis Duodenal ulcer   -transitioned to comfort care   Hypernatremia =transition to comfort care   Septic shock 2/2 enterobacter PNA -resolved   Acute blood loss anemia due to GIB -transitioned to comfort care   Thrombocytopenia  AKI on CKD3  Hx CAD s/p CABG/Hx diastolic HF/Hx HTN Prolonged Qtc   Pressure Injury March 16, 2021 Buttocks Stage 1 -  Intact skin with non-blanchable redness of a localized area usually over a bony prominence. Blanchable redness around edges. Nonblanchable in center (Active)   03-16-2021 05-Jun-2118  Location: Buttocks  Location Orientation:   Staging: Stage 1 -  Intact skin with non-blanchable redness of a localized area usually over a bony prominence.  Wound Description (Comments): Blanchable redness around edges. Nonblanchable in center  Present on Admission: Yes     Pressure Injury 03-16-2021 Heel Posterior;Bilateral Stage 1 -  Intact skin with non-blanchable redness of a localized area usually over a bony prominence. Blanchable redness on heels (Active)  03-16-2021 June 05, 2118  Location: Heel  Location Orientation: Posterior;Bilateral  Staging: Stage 1 -  Intact skin with non-blanchable redness of a localized area usually over a bony prominence.  Wound Description (Comments): Blanchable redness on heels  Present on Admission: Yes       Consultations: GI PCCM   The results of significant diagnostics from this hospitalization (including imaging, microbiology, ancillary and laboratory) are listed below for reference.   Significant Diagnostic Studies: DG Chest 2 View  Result Date: 02/23/2021 CLINICAL DATA:  Shortness of breath. EXAM: CHEST - 2 VIEW COMPARISON:  X-ray chest 02/09/2021. FINDINGS: Large hiatal hernia with adjacent lung atelectasis. Elevation of the left hemidiaphragm. No pleural effusion. Probable scarring/atelectasis at the left costophrenic angle. No new consolidation. Normal heart size. No acute osseous abnormality. IMPRESSION: No acute process in the chest. Probable scarring/atelectasis at the left costophrenic angle. Large hiatal hernia with adjacent atelectasis. Electronically Signed   By: Macy Mis M.D.   On: 02/23/2021 10:08   DG Chest Port 1 View  Result Date: 03/12/2021 CLINICAL DATA:  Endotracheal tube present,  respiratory failure EXAM: PORTABLE CHEST 1 VIEW COMPARISON:  03/14/2021 FINDINGS: Endotracheal tube, enteric tube, and left IJ central line are again identified. Similar small layering pleural effusions with bibasilar atelectasis.  Perihilar and lower lung atelectasis/consolidation similar to prior. No pneumothorax. Stable cardiomediastinal contours. IMPRESSION: No substantial change. Small layering pleural effusions and perihilar and lower lung atelectasis/consolidation that may reflect edema or pneumonia. Electronically Signed   By: Macy Mis M.D.   On: 03/06/2021 08:21   DG CHEST PORT 1 VIEW  Result Date: 03/14/2021 CLINICAL DATA:  Central line placement EXAM: PORTABLE CHEST 1 VIEW COMPARISON:  03/14/2021, 03/06/2021, 02/23/2021 FINDINGS: Endotracheal tube tip about 2.7 cm superior to the carina. Esophageal tube tip below the diaphragm but incompletely visualized. Left IJ central venous catheter tip over the SVC. No pneumothorax is seen. Post sternotomy changes. Small bilateral effusions and lower lung airspace disease with some worsening at the left base compared to radiograph earlier today. IMPRESSION: 1. New left IJ central venous catheter with tip over the SVC. No pneumothorax 2. Grossly similar lower lung airspace disease with probable small effusions. Electronically Signed   By: Donavan Foil M.D.   On: 03/14/2021 19:04   DG Chest Port 1 View  Result Date: 03/14/2021 CLINICAL DATA:  Hypoxia, emesis, intubated EXAM: PORTABLE CHEST 1 VIEW COMPARISON:  Chest radiograph 1 day prior FINDINGS: The endotracheal tube tip is approximately 3.9 cm from the carina. The enteric catheter is coiled in the stomach with the tip off the field of view. Median sternotomy wires are stable. The cardiomediastinal silhouette is stable. There is patchy and confluent airspace disease in both lung bases extending to the left mid lung. Overall, aeration has worsened since the study from 1 day prior. There are small bilateral pleural effusions, increased on the right. There is no pneumothorax. The bones are stable. IMPRESSION: 1. Endotracheal tube approximally 3.9 cm from the carina. 2. Patchy and confluent airspace disease in both lower lobes and  left midlung, overall worsened since the study from 1 day prior. 3. Small bilateral pleural effusions, increased on the right. Electronically Signed   By: Valetta Mole M.D.   On: 03/14/2021 08:41   DG Chest Port 1 View  Result Date: 03/07/2021 CLINICAL DATA:  Endotracheal tube placement EXAM: PORTABLE CHEST 1 VIEW COMPARISON:  02/23/2021 FINDINGS: An endotracheal tube has been placed with tip measuring 6.6 cm above the carina. An enteric tube has been placed with tip in the left upper quadrant consistent with location in the body of the stomach. Heart size is normal. Infiltration or atelectasis is increasing in the lung bases, particularly on the right. Hiatal hernia behind the heart is decreased. Calcification of the aorta. IMPRESSION: Appliances are in satisfactory position. Increasing atelectasis or infiltration in the lung bases. Electronically Signed   By: Lucienne Capers M.D.   On: 03/05/2021 22:07    Microbiology: Recent Results (from the past 240 hour(s))  Resp Panel by RT-PCR (Flu A&B, Covid) Nasopharyngeal Swab     Status: None   Collection Time: 03/01/2021  9:27 AM   Specimen: Nasopharyngeal Swab; Nasopharyngeal(NP) swabs in vial transport medium  Result Value Ref Range Status   SARS Coronavirus 2 by RT PCR NEGATIVE NEGATIVE Final    Comment: (NOTE) SARS-CoV-2 target nucleic acids are NOT DETECTED.  The SARS-CoV-2 RNA is generally detectable in upper respiratory specimens during the acute phase of infection. The lowest concentration of SARS-CoV-2 viral copies this assay can detect is 138 copies/mL. A negative result does not  preclude SARS-Cov-2 infection and should not be used as the sole basis for treatment or other patient management decisions. A negative result may occur with  improper specimen collection/handling, submission of specimen other than nasopharyngeal swab, presence of viral mutation(s) within the areas targeted by this assay, and inadequate number of  viral copies(<138 copies/mL). A negative result must be combined with clinical observations, patient history, and epidemiological information. The expected result is Negative.  Fact Sheet for Patients:  EntrepreneurPulse.com.au  Fact Sheet for Healthcare Providers:  IncredibleEmployment.be  This test is no t yet approved or cleared by the [place] FDA and  has been authorized for detection and/or diagnosis of SARS-CoV-2 by FDA under an Emergency Use Authorization (EUA). This EUA will remain  in effect (meaning this test can be used) for the duration of the COVID-19 declaration under Section 564(b)(1) of the Act, 21 U.S.C.section 360bbb-3(b)(1), unless the authorization is terminated  or revoked sooner.       Influenza A by PCR NEGATIVE NEGATIVE Final   Influenza B by PCR NEGATIVE NEGATIVE Final    Comment: (NOTE) The Xpert Xpress SARS-CoV-2/FLU/RSV plus assay is intended as an aid in the diagnosis of influenza from Nasopharyngeal swab specimens and should not be used as a sole basis for treatment. Nasal washings and aspirates are unacceptable for Xpert Xpress SARS-CoV-2/FLU/RSV testing.  Fact Sheet for Patients: EntrepreneurPulse.com.au  Fact Sheet for Healthcare Providers: IncredibleEmployment.be  This test is not yet approved or cleared by the [place] FDA and has been authorized for detection and/or diagnosis of SARS-CoV-2 by FDA under an Emergency Use Authorization (EUA). This EUA will remain in effect (meaning this test can be used) for the duration of the COVID-19 declaration under Section 564(b)(1) of the Act, 21 U.S.C. section 360bbb-3(b)(1), unless the authorization is terminated or revoked.  Performed at [facility], [place] [address] [place], [place] [address]   MRSA Next Gen by PCR, Nasal     Status: None   Collection Time: 02/25/2021  9:02 PM   Specimen: Nasal Mucosa; Nasal  Swab  Result Value Ref Range Status   MRSA by PCR Next Gen NOT DETECTED NOT DETECTED Final    Comment: (NOTE) The GeneXpert MRSA Assay (FDA approved for NASAL specimens only), is one component of a comprehensive MRSA colonization surveillance program. It is not intended to diagnose MRSA infection nor to guide or monitor treatment for MRSA infections. Test performance is not FDA approved in patients less than 56 years old. Performed at [facility], [place] [address] [place], [place] [address]   Culture, Respiratory w Gram Stain     Status: None   Collection Time: 03/14/21 10:04 AM   Specimen: Tracheal Aspirate; Respiratory  Result Value Ref Range Status   Specimen Description TRACHEAL ASPIRATE  Final   Special Requests NONE  Final   Gram Stain   Final    FEW WBC PRESENT,BOTH PMN AND MONONUCLEAR MODERATE GRAM NEGATIVE RODS Performed at [facility], [place] [address] [place], [place] [address]    Culture ABUNDANT ENTEROBACTER AEROGENES  Final   Report Status 03/16/2021 FINAL  Final   Organism ID, Bacteria ENTEROBACTER AEROGENES  Final      Susceptibility   Enterobacter aerogenes - MIC*    CEFAZOLIN >=64 RESISTANT Resistant     CEFEPIME <=0.12 SENSITIVE Sensitive     CEFTAZIDIME <=1 SENSITIVE Sensitive     CEFTRIAXONE <=0.25 SENSITIVE Sensitive     CIPROFLOXACIN <=0.25 SENSITIVE Sensitive  GENTAMICIN <=1 SENSITIVE Sensitive     IMIPENEM 2 SENSITIVE Sensitive     TRIMETH/SULFA <=20 SENSITIVE Sensitive     PIP/TAZO <=4 SENSITIVE Sensitive     * ABUNDANT ENTEROBACTER AEROGENES  Culture, blood (routine x 2)     Status: None   Collection Time: 03/14/21  1:11 PM   Specimen: BLOOD LEFT HAND  Result Value Ref Range Status   Specimen Description BLOOD LEFT HAND  Final   Special Requests   Final    BOTTLES DRAWN AEROBIC AND ANAEROBIC Blood Culture adequate volume   Culture   Final    NO GROWTH 5 DAYS Performed at [facility], [place] [address] [place], [place]  [address]    Report Status 03/19/2021 FINAL  Final  Culture, blood (routine x 2)     Status: None   Collection Time: 03/14/21  1:19 PM   Specimen: BLOOD LEFT HAND  Result Value Ref Range Status   Specimen Description BLOOD LEFT HAND  Final   Special Requests   Final    BOTTLES DRAWN AEROBIC ONLY Blood Culture results may not be optimal due to an inadequate volume of blood received in culture bottles   Culture   Final    NO GROWTH 5 DAYS Performed at [facility], [place] [address] [place], [place] [address]    Report Status 03/19/2021 FINAL  Final    Time spent: 35 minutes  Signed: Geradine Girt 03/21/21

## 2021-04-19 NOTE — Progress Notes (Addendum)
Informed by CCMD that pt's HR sustaining in 30's. Assessed pt to find apneic with no apical pulse for one full minute. Ronalee Belts, RN confirmed. Pronounced at 0143. Notified son and he stated he would call this nurse back to let me know if family is coming to view the body.

## 2021-04-19 NOTE — Progress Notes (Signed)
TR[facility] night cross cover note:  I was notified by RN that this patient, who was comfort care measures only, has passed away, with time of death noted to be 0143 on 2021/04/03.     Babs Bertin, DO Hospitalist

## 2021-04-19 DEATH — deceased

## 2021-05-01 ENCOUNTER — Other Ambulatory Visit: Payer: Self-pay | Admitting: Cardiology

## 2021-05-24 IMAGING — CR DG MYELOGRAPHY LUMBAR INJ LUMBOSACRAL
11 series · 11 of 11 positions shown · non-contrast
Comparison: MRI lumbar spine dated November 15, 2017.

CLINICAL DATA: Chronic low back pain radiating down both legs to
the calves. No prior surgery.
TECHNIQUE: Contiguous axial images were obtained through the lumbar spine after
the intrathecal infusion of contrast. Coronal and sagittal
reconstructions were obtained of the axial image sets.

[w lumbar spine lat]
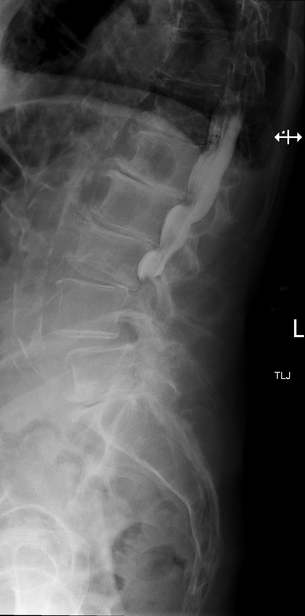

[vasc standard (1 of 8)]
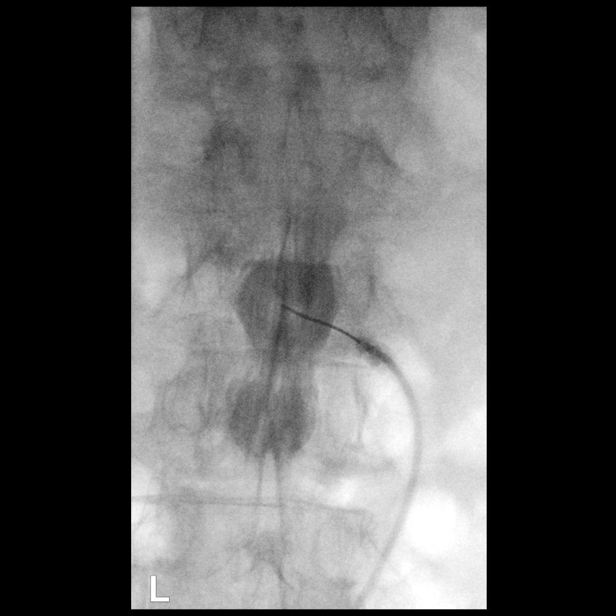

[vasc standard (2 of 8)]
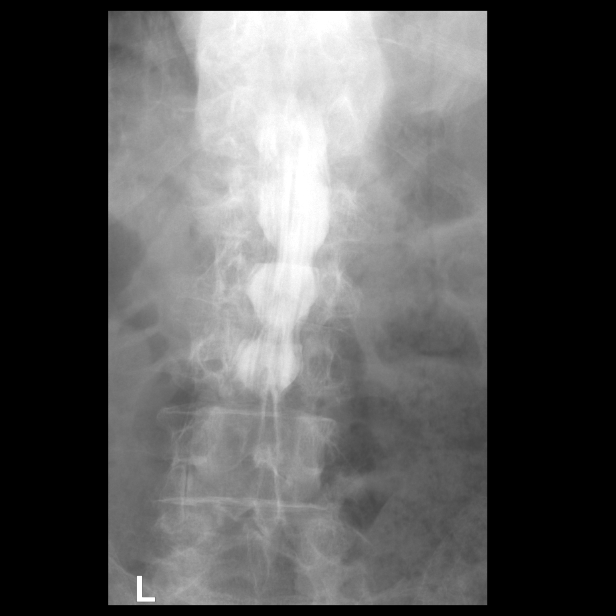

[w lumbar spine flexion]
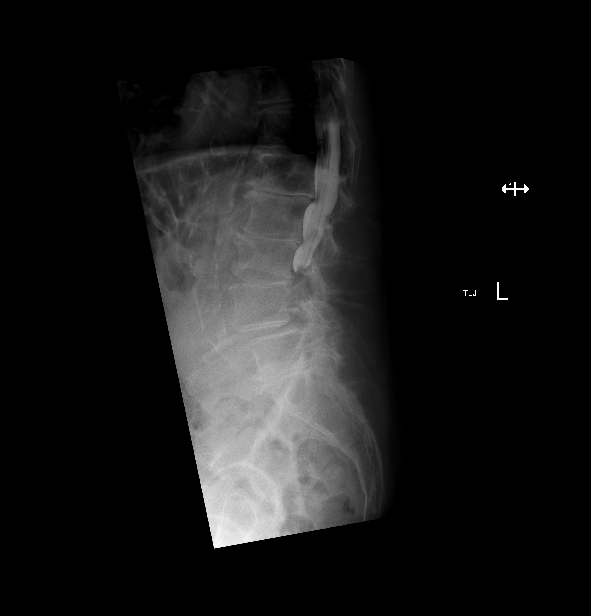

[vasc standard (3 of 8)]
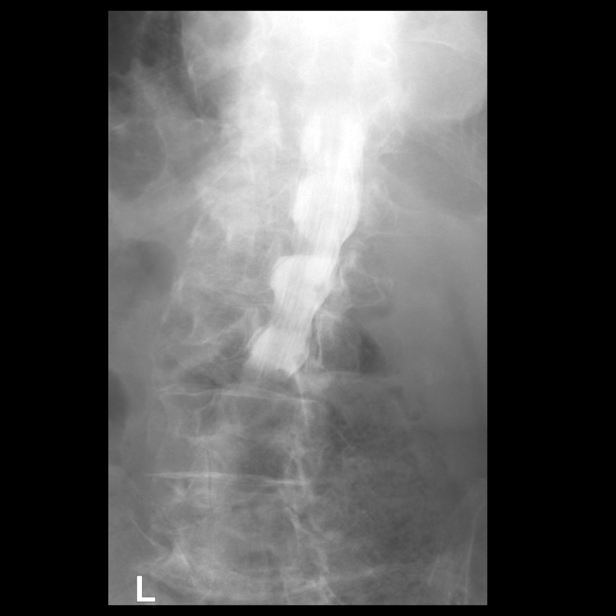

[w lumbar spine extension]
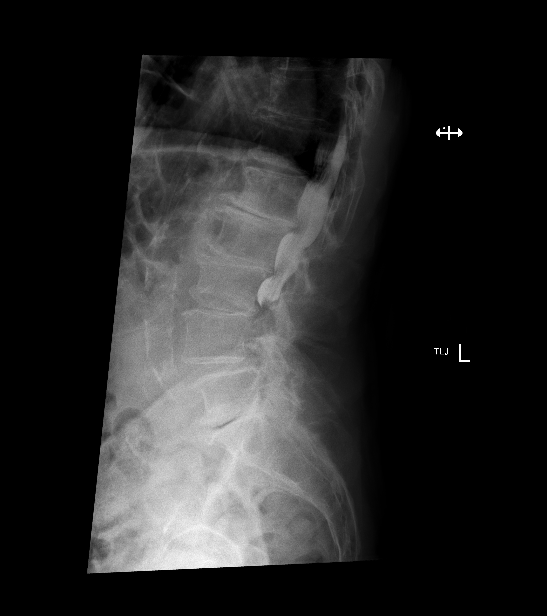

[vasc standard (4 of 8)]
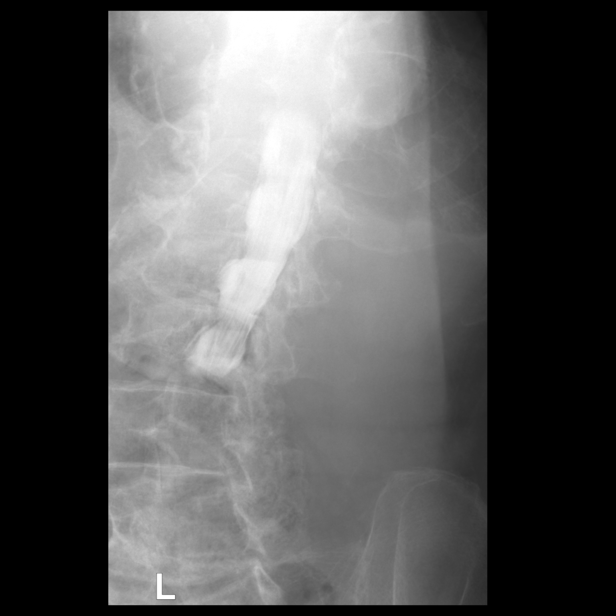

[vasc standard (5 of 8)]
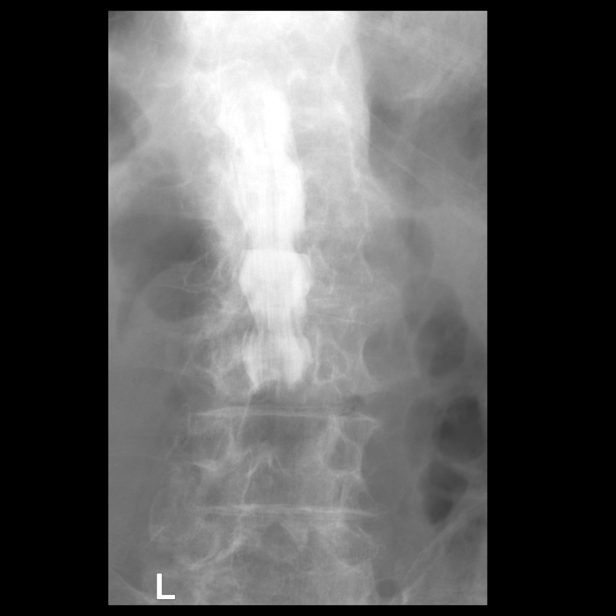

[vasc standard (6 of 8)]
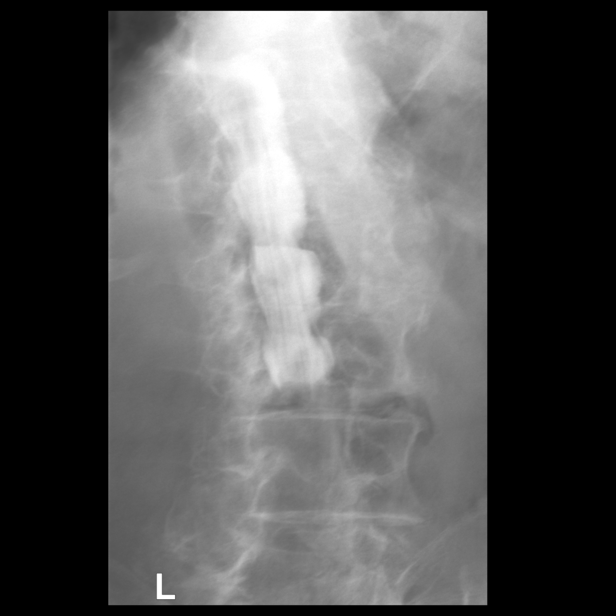

[vasc standard (7 of 8)]
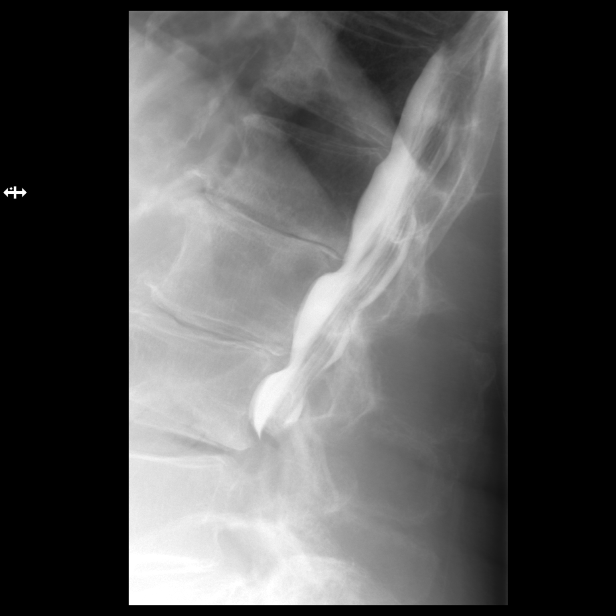

[vasc standard (8 of 8)]
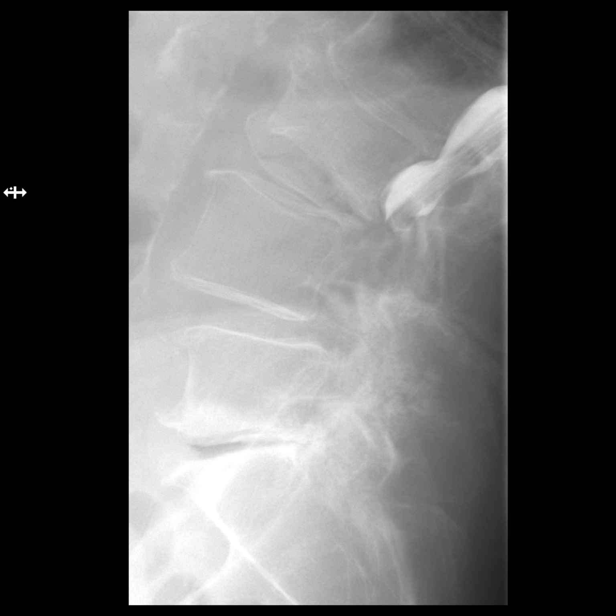

[11 of 11 positions shown; findings below may reference images not displayed]

EXAM:
LUMBAR MYELOGRAM

CT LUMBAR MYELOGRAM

FLUOROSCOPY TIME:  Radiation Exposure Index (as provided by the
fluoroscopic device): 9.2 mGy

Fluoroscopy Time:  6 seconds

Number of Acquired Images:  10

PROCEDURE:
After thorough discussion of risks and benefits of the procedure
including bleeding, infection, injury to nerves, blood vessels,
adjacent structures as well as headache and CSF leak, written and
oral informed consent was obtained. Consent was obtained by Dr.
Baffoa Jennifer. Time out form was completed.

Patient was positioned prone on the fluoroscopy table. Local
anesthesia was provided with 1% lidocaine without epinephrine after
prepped and draped in the usual sterile fashion. Puncture was
performed at L1-L2 using a 3 1/2 inch 22-gauge spinal needle via
right interlaminar approach. Using a single pass through the dura,
the needle was placed within the thecal sac, with return of clear
CSF. 15 mL of Isovue [id number] was injected into the thecal sac, with
normal opacification of the nerve roots and cauda equina consistent
with free flow within the subarachnoid space.

I personally performed the lumbar puncture and administered the
intrathecal contrast. I also personally supervised acquisition of
the myelogram images.
FINDINGS: LUMBAR MYELOGRAM FINDINGS:

New chronic L3 inferior endplate compression deformity with 4 mm
retropulsion of the posterior inferior endplate.

7 mm anterolisthesis at L4-L5 increases to 9 mm with standing and
flexion. 4 mm retrolisthesis at L5-S1 without instability. Trace
retrolisthesis at L1-L2 and L2-L3 slightly increase with standing.

Moderate ventral extradural defect at L3-L4. Small ventral
extradural defects at L1-L2 and L2-L3. Complete contrast block at
L3-L4. Mild lateral recess stenosis at L1-L2 and L2-L3.

CT LUMBAR MYELOGRAM FINDINGS:

Segmentation: Standard.

Alignment: Unchanged 7 mm anterolisthesis at L4-L5. Unchanged trace
retrolisthesis at L1-L2 and L2-L3. Unchanged 4 mm retrolisthesis at
L5-S1.

Vertebrae: Chronic moderate L3 inferior endplate compression
deformity with 4 mm retropulsion of the posterior inferior endplate,
new since October 2017. No acute fracture or other focal pathologic
process.

Conus medullaris and cauda equina: Conus extends to the L1 level.
Conus and cauda equina appear normal.

Paraspinal and other soft tissues: Unchanged large hiatal hernia
containing stomach and part of the transverse colon. Aortoiliac
atherosclerosis.

Disc levels:

T11-T12: Unchanged minimal disc bulging.  No stenosis.

T12-L1:  Unchanged mild diffuse disc bulging.  No stenosis.

L1-L2: Chronic severe disc height loss with small circumferential
disc osteophyte complex. Mild bilateral facet arthropathy. Unchanged
mild bilateral lateral recess stenosis. Unchanged moderate left and
mild right neuroforaminal stenosis. No spinal canal stenosis.

L2-L3: Chronic moderate to severe disc height loss with small
circumferential disc osteophyte complex. Prominent central endplate
osteophyte. Mild bilateral facet arthropathy. Unchanged mild to
moderate spinal canal stenosis and moderate bilateral neuroforaminal
stenosis.

L3-L4: Progressive moderate diffuse disc bulging and unchanged
moderate bilateral facet arthropathy with prominent ligamentum
flavum hypertrophy. Unchanged severe spinal canal stenosis and
moderate bilateral neuroforaminal stenosis.

L4-L5: Unchanged disc uncovering with mild diffuse disc bulging.
Unchanged severe bilateral facet arthropathy. Unchanged moderate
spinal canal stenosis, severe right and moderate left lateral recess
stenosis, and moderate right and mild left neuroforaminal stenosis.

L5-S1: Chronic severe disc height loss with small circumferential
disc osteophyte complex and superimposed small central disc
protrusion. Unchanged mild-to-moderate bilateral facet arthropathy.
Unchanged moderate bilateral neuroforaminal stenosis. No spinal
canal stenosis.
IMPRESSION: 1. New chronic moderate L3 inferior endplate compression deformity
with progressive degenerative disc disease at L3-L4. Continued
severe spinal canal stenosis at this level with complete contrast
block on myelography.
2. Otherwise unchanged multilevel lumbar spondylosis as described
above. Mild-to-moderate L2-L3 and moderate L4-L5 spinal canal
stenosis. Up to moderate multilevel neuroforaminal stenosis.
3. Unchanged large hiatal hernia.
4.  Aortic atherosclerosis ([id number]).

## 2021-06-14 ENCOUNTER — Ambulatory Visit: Payer: MEDICARE | Admitting: Cardiology
